# Patient Record
Sex: Female | Born: 1945 | Race: Black or African American | Hispanic: No | Marital: Married | Smoking: Never smoker
Health system: Southern US, Community
[De-identification: ages and names within clinical notes are randomized; demographics above are authoritative.]

## PROBLEM LIST (undated history)

## (undated) DIAGNOSIS — I10 Essential (primary) hypertension: Secondary | ICD-10-CM

## (undated) DIAGNOSIS — I2510 Atherosclerotic heart disease of native coronary artery without angina pectoris: Secondary | ICD-10-CM

## (undated) DIAGNOSIS — E1165 Type 2 diabetes mellitus with hyperglycemia: Secondary | ICD-10-CM

## (undated) DIAGNOSIS — R55 Syncope and collapse: Secondary | ICD-10-CM

## (undated) DIAGNOSIS — I779 Disorder of arteries and arterioles, unspecified: Secondary | ICD-10-CM

## (undated) DIAGNOSIS — L03119 Cellulitis of unspecified part of limb: Secondary | ICD-10-CM

## (undated) DIAGNOSIS — I639 Cerebral infarction, unspecified: Secondary | ICD-10-CM

## (undated) DIAGNOSIS — I350 Nonrheumatic aortic (valve) stenosis: Secondary | ICD-10-CM

## (undated) DIAGNOSIS — I272 Pulmonary hypertension, unspecified: Secondary | ICD-10-CM

## (undated) DIAGNOSIS — K922 Gastrointestinal hemorrhage, unspecified: Secondary | ICD-10-CM

## (undated) DIAGNOSIS — E119 Type 2 diabetes mellitus without complications: Secondary | ICD-10-CM

## (undated) DIAGNOSIS — I679 Cerebrovascular disease, unspecified: Secondary | ICD-10-CM

## (undated) DIAGNOSIS — I469 Cardiac arrest, cause unspecified: Secondary | ICD-10-CM

## (undated) DIAGNOSIS — D649 Anemia, unspecified: Secondary | ICD-10-CM

## (undated) DIAGNOSIS — Z9289 Personal history of other medical treatment: Secondary | ICD-10-CM

## (undated) DIAGNOSIS — I739 Peripheral vascular disease, unspecified: Secondary | ICD-10-CM

## (undated) DIAGNOSIS — IMO0002 Reserved for concepts with insufficient information to code with codable children: Secondary | ICD-10-CM

## (undated) DIAGNOSIS — Z992 Dependence on renal dialysis: Secondary | ICD-10-CM

## (undated) DIAGNOSIS — D696 Thrombocytopenia, unspecified: Secondary | ICD-10-CM

## (undated) DIAGNOSIS — I82409 Acute embolism and thrombosis of unspecified deep veins of unspecified lower extremity: Secondary | ICD-10-CM

## (undated) DIAGNOSIS — L02419 Cutaneous abscess of limb, unspecified: Secondary | ICD-10-CM

## (undated) DIAGNOSIS — I509 Heart failure, unspecified: Secondary | ICD-10-CM

## (undated) DIAGNOSIS — I219 Acute myocardial infarction, unspecified: Secondary | ICD-10-CM

## (undated) DIAGNOSIS — N186 End stage renal disease: Secondary | ICD-10-CM

## (undated) DIAGNOSIS — E118 Type 2 diabetes mellitus with unspecified complications: Secondary | ICD-10-CM

## (undated) HISTORY — PX: AV FISTULA PLACEMENT: SHX1204

## (undated) HISTORY — PX: BELOW KNEE LEG AMPUTATION: SUR23

## (undated) HISTORY — PX: TONSILLECTOMY: SUR1361

## (undated) HISTORY — PX: CATARACT EXTRACTION W/ INTRAOCULAR LENS  IMPLANT, BILATERAL: SHX1307

---

## 1997-12-01 ENCOUNTER — Ambulatory Visit (HOSPITAL_COMMUNITY): Admission: RE | Admit: 1997-12-01 | Discharge: 1997-12-01 | Payer: Self-pay | Admitting: Internal Medicine

## 1998-09-15 ENCOUNTER — Other Ambulatory Visit: Admission: RE | Admit: 1998-09-15 | Discharge: 1998-09-15 | Payer: Self-pay | Admitting: *Deleted

## 1999-04-10 ENCOUNTER — Ambulatory Visit (HOSPITAL_COMMUNITY): Admission: RE | Admit: 1999-04-10 | Discharge: 1999-04-10 | Payer: Self-pay | Admitting: *Deleted

## 1999-04-10 ENCOUNTER — Encounter: Payer: Self-pay | Admitting: *Deleted

## 2000-08-27 ENCOUNTER — Ambulatory Visit (HOSPITAL_COMMUNITY): Admission: RE | Admit: 2000-08-27 | Discharge: 2000-08-27 | Payer: Self-pay | Admitting: Internal Medicine

## 2000-08-27 ENCOUNTER — Encounter: Payer: Self-pay | Admitting: Internal Medicine

## 2000-08-31 ENCOUNTER — Inpatient Hospital Stay (HOSPITAL_COMMUNITY): Admission: EM | Admit: 2000-08-31 | Discharge: 2000-09-05 | Payer: Self-pay

## 2000-09-04 ENCOUNTER — Encounter: Payer: Self-pay | Admitting: Orthopedic Surgery

## 2000-09-09 DIAGNOSIS — Z9289 Personal history of other medical treatment: Secondary | ICD-10-CM

## 2000-09-09 HISTORY — DX: Personal history of other medical treatment: Z92.89

## 2000-09-09 HISTORY — PX: BELOW KNEE LEG AMPUTATION: SUR23

## 2000-12-15 ENCOUNTER — Encounter: Payer: Self-pay | Admitting: Internal Medicine

## 2000-12-15 ENCOUNTER — Ambulatory Visit (HOSPITAL_COMMUNITY): Admission: RE | Admit: 2000-12-15 | Discharge: 2000-12-15 | Payer: Self-pay | Admitting: Internal Medicine

## 2001-01-12 ENCOUNTER — Ambulatory Visit (HOSPITAL_COMMUNITY): Admission: RE | Admit: 2001-01-12 | Discharge: 2001-01-12 | Payer: Self-pay | Admitting: Surgery

## 2001-01-12 ENCOUNTER — Encounter: Payer: Self-pay | Admitting: Surgery

## 2001-01-30 ENCOUNTER — Encounter: Payer: Self-pay | Admitting: Internal Medicine

## 2001-01-30 ENCOUNTER — Ambulatory Visit (HOSPITAL_COMMUNITY): Admission: RE | Admit: 2001-01-30 | Discharge: 2001-01-30 | Payer: Self-pay | Admitting: Internal Medicine

## 2001-02-06 ENCOUNTER — Ambulatory Visit (HOSPITAL_COMMUNITY): Admission: RE | Admit: 2001-02-06 | Discharge: 2001-02-07 | Payer: Self-pay | Admitting: Cardiovascular Disease

## 2001-02-06 ENCOUNTER — Encounter: Payer: Self-pay | Admitting: Cardiovascular Disease

## 2001-02-16 ENCOUNTER — Inpatient Hospital Stay (HOSPITAL_COMMUNITY): Admission: RE | Admit: 2001-02-16 | Discharge: 2001-02-23 | Payer: Self-pay

## 2001-03-09 ENCOUNTER — Encounter (INDEPENDENT_AMBULATORY_CARE_PROVIDER_SITE_OTHER): Payer: Self-pay | Admitting: *Deleted

## 2001-03-09 ENCOUNTER — Inpatient Hospital Stay (HOSPITAL_COMMUNITY): Admission: EM | Admit: 2001-03-09 | Discharge: 2001-03-27 | Payer: Self-pay | Admitting: Emergency Medicine

## 2001-03-27 ENCOUNTER — Inpatient Hospital Stay (HOSPITAL_COMMUNITY)
Admission: RE | Admit: 2001-03-27 | Discharge: 2001-04-24 | Payer: Self-pay | Admitting: Physical Medicine & Rehabilitation

## 2001-03-28 ENCOUNTER — Encounter: Payer: Self-pay | Admitting: Physical Medicine & Rehabilitation

## 2001-03-30 ENCOUNTER — Encounter: Payer: Self-pay | Admitting: Physical Medicine & Rehabilitation

## 2001-04-03 ENCOUNTER — Encounter: Payer: Self-pay | Admitting: Physical Medicine & Rehabilitation

## 2001-04-05 ENCOUNTER — Encounter: Payer: Self-pay | Admitting: Physical Medicine & Rehabilitation

## 2001-04-06 ENCOUNTER — Encounter: Payer: Self-pay | Admitting: Physical Medicine & Rehabilitation

## 2001-04-07 ENCOUNTER — Encounter: Payer: Self-pay | Admitting: Physical Medicine & Rehabilitation

## 2001-05-18 ENCOUNTER — Encounter: Payer: Self-pay | Admitting: Internal Medicine

## 2001-05-18 ENCOUNTER — Inpatient Hospital Stay (HOSPITAL_COMMUNITY): Admission: EM | Admit: 2001-05-18 | Discharge: 2001-05-20 | Payer: Self-pay | Admitting: Emergency Medicine

## 2001-05-18 ENCOUNTER — Encounter: Payer: Self-pay | Admitting: Emergency Medicine

## 2001-05-21 ENCOUNTER — Encounter (HOSPITAL_COMMUNITY): Admission: RE | Admit: 2001-05-21 | Discharge: 2001-08-19 | Payer: Self-pay

## 2001-07-21 ENCOUNTER — Encounter
Admission: RE | Admit: 2001-07-21 | Discharge: 2001-10-19 | Payer: Self-pay | Admitting: Physical Medicine & Rehabilitation

## 2001-08-20 ENCOUNTER — Encounter (HOSPITAL_COMMUNITY): Admission: RE | Admit: 2001-08-20 | Discharge: 2001-09-08 | Payer: Self-pay

## 2001-09-10 ENCOUNTER — Encounter: Admission: RE | Admit: 2001-09-10 | Discharge: 2001-12-09 | Payer: Self-pay

## 2001-11-24 ENCOUNTER — Encounter
Admission: RE | Admit: 2001-11-24 | Discharge: 2002-02-22 | Payer: Self-pay | Admitting: Physical Medicine & Rehabilitation

## 2001-12-17 ENCOUNTER — Ambulatory Visit (HOSPITAL_COMMUNITY): Admission: RE | Admit: 2001-12-17 | Discharge: 2001-12-17 | Payer: Self-pay | Admitting: Internal Medicine

## 2001-12-30 ENCOUNTER — Encounter
Admission: RE | Admit: 2001-12-30 | Discharge: 2002-03-30 | Payer: Self-pay | Admitting: Physical Medicine & Rehabilitation

## 2002-04-01 ENCOUNTER — Encounter
Admission: RE | Admit: 2002-04-01 | Discharge: 2002-06-30 | Payer: Self-pay | Admitting: Physical Medicine & Rehabilitation

## 2002-07-01 ENCOUNTER — Encounter
Admission: RE | Admit: 2002-07-01 | Discharge: 2002-08-30 | Payer: Self-pay | Admitting: Physical Medicine & Rehabilitation

## 2002-12-23 ENCOUNTER — Ambulatory Visit (HOSPITAL_COMMUNITY): Admission: RE | Admit: 2002-12-23 | Discharge: 2002-12-23 | Payer: Self-pay | Admitting: Orthopedic Surgery

## 2003-12-28 ENCOUNTER — Encounter: Admission: RE | Admit: 2003-12-28 | Discharge: 2003-12-28 | Payer: Self-pay | Admitting: Nephrology

## 2005-05-27 ENCOUNTER — Ambulatory Visit (HOSPITAL_COMMUNITY): Admission: RE | Admit: 2005-05-27 | Discharge: 2005-05-27 | Payer: Self-pay | Admitting: Internal Medicine

## 2005-10-11 ENCOUNTER — Encounter: Admission: RE | Admit: 2005-10-11 | Discharge: 2005-10-11 | Payer: Self-pay | Admitting: Orthopedic Surgery

## 2007-01-11 ENCOUNTER — Inpatient Hospital Stay (HOSPITAL_COMMUNITY): Admission: EM | Admit: 2007-01-11 | Discharge: 2007-01-13 | Payer: Self-pay | Admitting: Emergency Medicine

## 2007-04-17 ENCOUNTER — Ambulatory Visit (HOSPITAL_COMMUNITY): Admission: RE | Admit: 2007-04-17 | Discharge: 2007-04-17 | Payer: Self-pay | Admitting: Internal Medicine

## 2007-04-17 ENCOUNTER — Encounter: Payer: Self-pay | Admitting: Internal Medicine

## 2007-04-17 ENCOUNTER — Ambulatory Visit: Payer: Self-pay | Admitting: Vascular Surgery

## 2007-05-29 ENCOUNTER — Ambulatory Visit: Payer: Self-pay | Admitting: Vascular Surgery

## 2008-08-01 ENCOUNTER — Ambulatory Visit: Payer: Self-pay | Admitting: Surgery

## 2008-08-01 ENCOUNTER — Encounter (HOSPITAL_BASED_OUTPATIENT_CLINIC_OR_DEPARTMENT_OTHER): Admission: RE | Admit: 2008-08-01 | Discharge: 2008-09-05 | Payer: Self-pay | Admitting: Internal Medicine

## 2008-09-06 ENCOUNTER — Encounter (HOSPITAL_BASED_OUTPATIENT_CLINIC_OR_DEPARTMENT_OTHER): Admission: RE | Admit: 2008-09-06 | Discharge: 2008-12-05 | Payer: Self-pay | Admitting: Internal Medicine

## 2008-11-07 ENCOUNTER — Ambulatory Visit: Payer: Self-pay | Admitting: Surgery

## 2008-12-01 ENCOUNTER — Encounter (HOSPITAL_BASED_OUTPATIENT_CLINIC_OR_DEPARTMENT_OTHER): Admission: RE | Admit: 2008-12-01 | Discharge: 2009-01-10 | Payer: Self-pay | Admitting: General Surgery

## 2009-04-03 ENCOUNTER — Ambulatory Visit: Payer: Self-pay | Admitting: Surgery

## 2009-09-09 DIAGNOSIS — I2510 Atherosclerotic heart disease of native coronary artery without angina pectoris: Secondary | ICD-10-CM

## 2009-09-09 HISTORY — PX: AV FISTULA PLACEMENT: SHX1204

## 2009-09-09 HISTORY — DX: Atherosclerotic heart disease of native coronary artery without angina pectoris: I25.10

## 2009-11-09 ENCOUNTER — Ambulatory Visit: Payer: Self-pay | Admitting: Vascular Surgery

## 2009-11-24 ENCOUNTER — Encounter: Admission: RE | Admit: 2009-11-24 | Discharge: 2009-12-18 | Payer: Self-pay | Admitting: Orthopedic Surgery

## 2010-04-04 ENCOUNTER — Encounter (HOSPITAL_COMMUNITY): Admission: RE | Admit: 2010-04-04 | Discharge: 2010-05-31 | Payer: Self-pay | Admitting: Nephrology

## 2010-04-16 ENCOUNTER — Ambulatory Visit: Payer: Self-pay | Admitting: Vascular Surgery

## 2010-04-25 ENCOUNTER — Ambulatory Visit: Payer: Self-pay | Admitting: Vascular Surgery

## 2010-06-25 ENCOUNTER — Ambulatory Visit: Payer: Self-pay | Admitting: Vascular Surgery

## 2010-07-06 ENCOUNTER — Ambulatory Visit (HOSPITAL_COMMUNITY)
Admission: RE | Admit: 2010-07-06 | Discharge: 2010-07-06 | Payer: Self-pay | Source: Home / Self Care | Admitting: Nephrology

## 2010-07-23 ENCOUNTER — Ambulatory Visit: Payer: Self-pay | Admitting: Vascular Surgery

## 2010-07-25 ENCOUNTER — Ambulatory Visit: Payer: Self-pay | Admitting: Vascular Surgery

## 2010-08-04 ENCOUNTER — Emergency Department (HOSPITAL_COMMUNITY)
Admission: EM | Admit: 2010-08-04 | Discharge: 2010-08-05 | Payer: Self-pay | Source: Home / Self Care | Admitting: Emergency Medicine

## 2010-08-07 ENCOUNTER — Ambulatory Visit: Payer: Self-pay | Admitting: Cardiovascular Disease

## 2010-08-15 ENCOUNTER — Ambulatory Visit (HOSPITAL_COMMUNITY)
Admission: RE | Admit: 2010-08-15 | Discharge: 2010-08-15 | Payer: Self-pay | Source: Home / Self Care | Attending: Nephrology | Admitting: Nephrology

## 2010-08-29 ENCOUNTER — Emergency Department (HOSPITAL_COMMUNITY)
Admission: EM | Admit: 2010-08-29 | Discharge: 2010-08-29 | Payer: Self-pay | Source: Home / Self Care | Admitting: Emergency Medicine

## 2010-08-29 ENCOUNTER — Inpatient Hospital Stay (HOSPITAL_COMMUNITY)
Admission: EM | Admit: 2010-08-29 | Discharge: 2010-09-02 | Payer: Self-pay | Source: Home / Self Care | Attending: Internal Medicine | Admitting: Internal Medicine

## 2010-08-30 ENCOUNTER — Encounter (INDEPENDENT_AMBULATORY_CARE_PROVIDER_SITE_OTHER): Payer: Self-pay | Admitting: Internal Medicine

## 2010-08-31 ENCOUNTER — Telehealth: Payer: Self-pay | Admitting: Cardiology

## 2010-09-13 ENCOUNTER — Encounter: Payer: Self-pay | Admitting: Physician Assistant

## 2010-09-13 ENCOUNTER — Ambulatory Visit
Admission: RE | Admit: 2010-09-13 | Discharge: 2010-09-13 | Payer: Self-pay | Source: Home / Self Care | Attending: Physician Assistant | Admitting: Physician Assistant

## 2010-09-13 DIAGNOSIS — E669 Obesity, unspecified: Secondary | ICD-10-CM | POA: Insufficient documentation

## 2010-09-13 DIAGNOSIS — I10 Essential (primary) hypertension: Secondary | ICD-10-CM | POA: Insufficient documentation

## 2010-09-13 DIAGNOSIS — N186 End stage renal disease: Secondary | ICD-10-CM

## 2010-09-13 DIAGNOSIS — E1122 Type 2 diabetes mellitus with diabetic chronic kidney disease: Secondary | ICD-10-CM | POA: Insufficient documentation

## 2010-09-13 DIAGNOSIS — I2510 Atherosclerotic heart disease of native coronary artery without angina pectoris: Secondary | ICD-10-CM | POA: Insufficient documentation

## 2010-09-13 DIAGNOSIS — E785 Hyperlipidemia, unspecified: Secondary | ICD-10-CM | POA: Insufficient documentation

## 2010-09-13 DIAGNOSIS — I2789 Other specified pulmonary heart diseases: Secondary | ICD-10-CM | POA: Insufficient documentation

## 2010-09-13 DIAGNOSIS — N289 Disorder of kidney and ureter, unspecified: Secondary | ICD-10-CM | POA: Insufficient documentation

## 2010-09-13 DIAGNOSIS — Z89519 Acquired absence of unspecified leg below knee: Secondary | ICD-10-CM | POA: Insufficient documentation

## 2010-09-13 DIAGNOSIS — R55 Syncope and collapse: Secondary | ICD-10-CM | POA: Insufficient documentation

## 2010-10-04 ENCOUNTER — Ambulatory Visit
Admission: RE | Admit: 2010-10-04 | Discharge: 2010-10-04 | Payer: Self-pay | Source: Home / Self Care | Attending: Cardiology | Admitting: Cardiology

## 2010-10-04 DIAGNOSIS — R0989 Other specified symptoms and signs involving the circulatory and respiratory systems: Secondary | ICD-10-CM | POA: Insufficient documentation

## 2010-10-04 DIAGNOSIS — I080 Rheumatic disorders of both mitral and aortic valves: Secondary | ICD-10-CM | POA: Insufficient documentation

## 2010-10-04 DIAGNOSIS — I359 Nonrheumatic aortic valve disorder, unspecified: Secondary | ICD-10-CM | POA: Insufficient documentation

## 2010-10-09 ENCOUNTER — Ambulatory Visit
Admission: RE | Admit: 2010-10-09 | Discharge: 2010-10-09 | Payer: Self-pay | Source: Home / Self Care | Attending: Emergency Medicine | Admitting: Emergency Medicine

## 2010-10-11 ENCOUNTER — Ambulatory Visit: Payer: Medicare Other | Admitting: Vascular Surgery

## 2010-10-11 NOTE — Assessment & Plan Note (Signed)
Summary: eph   Visit Type:  Follow-up Primary Provider:  Dr Margaretmary Bayley  CC:  CAD and mitral regurgitation.  History of Present Illness: The patient is seen for followup of coronary artery disease.  She also has significant mitral regurgitation.  There is pulmonary hypertension.  She has significant mitral regurgitation.  She had an episode of syncope in December, 2011.  She was hospitalized with this with an extensive workup.  There was no clear-cut etiology found.  Catheterization was done.  She has significant disease but medical therapy is recommended.  We are following her coronary disease and other problems and she is stable.  She's not having any significant chest pain or shortness of breath.  Her dialysis is going well.  There's been no recurrence.  Current Medications (verified): 1)  Amlodipine Besylate 10 Mg Tabs (Amlodipine Besylate) .Marland Kitchen.. 1 Once Daily 2)  Glimepiride 4 Mg Tabs (Glimepiride) .Marland Kitchen.. 1 Once Daily 3)  Hydralazine Hcl 25 Mg Tabs (Hydralazine Hcl) .... Take One & A Half Tablets By Mouth Three Times A Day. 4)  Simvastatin 20 Mg Tabs (Simvastatin) .Marland Kitchen.. 1 Once Daily 5)  Nephro-Vite Rx 1 Mg Tabs (B Complex-C-Folic Acid) .Marland Kitchen.. 1 Once Daily 6)  Isosorbide Dinitrate 30 Mg Tabs (Isosorbide Dinitrate) .Marland Kitchen.. 1 Once Daily 7)  Doxazosin Mesylate 8 Mg Tabs (Doxazosin Mesylate) .Marland Kitchen.. 1 Once Daily 8)  Ecotrin 325 Mg Tbec (Aspirin) .Marland Kitchen.. 1 Once Daily 9)  Ondansetron 8 Mg Tbdp (Ondansetron) .... As Needed 10)  Labetalol Hcl 300 Mg Tabs (Labetalol Hcl) .Marland Kitchen.. 1 Two Times A Day 11)  Hydralazine Hcl 25 Mg Tabs (Hydralazine Hcl) .... Take One Tablet By Mouth Three Times A Day 12)  Bystolic 5 Mg Tabs (Nebivolol Hcl) .... 1/2 Tab Once Daily 13)  Acetaminophen 500 Mg  Caps (Acetaminophen) .... As Needed  Allergies (verified): No Known Drug Allergies  Past History:  Past Medical History: CAD   a.  3v CAD by cath 08/2010 . . . diffuse c/w diab. vasculopathy . . med Rx EF 55-60%   12//2011: R  Heart Cath 08/2010: c/w Mod to Severe Pulmo HTN and severe MR Mitral regurgitation   moderate by echo, severe by catheter... December, 2011 Aortic stenosis    mild by echo... December, 2011 Pulmonary Hypertension CKD    dialysis since November 2011.  Hypertension.  Left BKA.  Obesity.  Volume overload problems in the past.  Diabetes.  Anemia.Marland KitchenMarland KitchenCKD Syncope    08/2010...etiology unclear Carotid bruits  Review of Systems       Patient denies fever, chills, headache, sweats, rash, change in vision, change in hearing, chest pain, cough, nausea vomiting, urinary symptoms.  All of the systems are reviewed and are negative.  Vital Signs:  Patient profile:   65 year old female Height:      56 inches Weight:      203 pounds BMI:     45.68 Pulse rate:   80 / minute BP sitting:   144 / 66  (left arm) Cuff size:   regular  Vitals Entered By: Hardin Negus, RMA (October 04, 2010 12:00 PM)  Physical Exam  General:  the patient is stable. Head:  head is atraumatic. Eyes:  no xanthelasma. Neck:  no jugular venous distention.  Dialysis catheter in the right subclavian and into the neck.  There are bilateral carotid bruits. Chest Wall:  no chest wall tenderness. Lungs:  lungs are clear.  Respiratory effort is nonlabored. Heart:  cardiac exam reveals S1-S2 and a systolic  murmur. Abdomen:  abdomen is soft. Msk:  no musculoskeletal deformities.  She does walk with a cane. Extremities:  no peripheral edema. Skin:  no skin rashes. Psych:  patient is oriented to person time and place.  Affect is normal.   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  It is possible some of this is related to her mild aortic stenosis.  However they seem well.  I have reviewed the old hospital record and found the Doppler from 2008.  At that time she did not have significant carotid  disease.  She does need carotid Doppler followup at this time and this is being scheduled.  Problem # 2:  MITRAL REGURGITATION (ICD-396.3) The patient has mitral regurgitation.  It was felt to be moderate by echo and there was question of severe by catheter.  She has pulmonary hypertension.  I will await further information from the pulmonary group before any further evaluation.  Problem # 3:  SYNCOPE (ICD-780.2) The patient has not had any recurrent syncope since the episode that occurred after her dialysis.  Etiology remains unclear.  No further workup now.  Problem # 4:  HYPERLIPIDEMIA (ICD-272.4) Lipids are being treated.  No change in therapy.  Problem # 5:  PULMONARY HYPERTENSION (ICD-416.8) Patient has significant pulmonary hypertension.  She is scheduled to see Dr.Byrum today.  Problem # 6:  HYPERTENSION (ICD-401.9) The patient's blood pressure is under better control with recent changes in her medications.  No change in therapy.  Problem # 7:  RENAL DISEASE, CHRONIC (ICD-593.9) The patient is tolerating dialysis.  No change in therapy.  Problem # 8:  AORTIC STENOSIS (ICD-424.1) The patient has mild aortic stenosis by echo.  No further workup.  Problem # 9:  CORONARY ATHEROSCLEROSIS NATIVE CORONARY ARTERY (ICD-414.01) Patient has known coronary artery disease.  Catheterization was done in December, 2011.  She has diffuse disease compatible with diabetic vasculopathy.  There was no intervention.  Medical therapy is recommended.  EKG is done today and reviewed by me.  As compared with the tracing of August 31, 2010 she has an interventricular conduction delay.  There is normal sinus rhythm.  There is no significant change.  Followup in 3 months.  Other Orders: Carotid Duplex (Carotid Duplex)  Patient Instructions: 1)  Your physician recommends that you schedule a follow-up appointment in: 3 months 2)  Your physician recommends that you continue on your current medications as  directed. Please refer to the Current Medication list given to you today. 3)  Your physician has requested that you have a carotid duplex. This test is an ultrasound of the carotid arteries in your neck. It looks at blood flow through these arteries that supply the brain with blood. Allow one hour for this exam. There are no restrictions or special instructions.

## 2010-10-11 NOTE — Progress Notes (Signed)
Summary: question on procedure  Phone Note Call from Patient Call back at [phone]   Caller: Daughter/dana Reason for Call: Talk to Nurse Summary of Call: pt mother is on the hospital. pt daugther has question re procedure. Initial call taken by: Roe Coombs,  August 31, 2010 12:15 PM  Follow-up for Phone Call        08/01/10--1320pm--pt's daughter calling about c cath procedure being done today by dr Lottie Mussel with dr Graciela Husbands and sent him daughter's phone number, so he may answer her questions--nt Follow-up by: Ledon Snare, RN,  August 31, 2010 1:24 PM

## 2010-10-11 NOTE — Assessment & Plan Note (Signed)
Summary: eph   Visit Type:  PHOSP  CC:  NO CARDIAC COMPLAINTS.  History of Present Illness: Primary Cardiologist:  Dr. Zackery Barefoot  Briana Gould is a 65 yo female with ESRD on hemodialysis, PAD s/p failed revasc. surgery in the past with eventual left BKA, DM2 and HTN who was admitted to [facility] 12/21-25/2011 with syncope during dialysis.  TnI's were elevated suspicious for NSTEMI.  She underwent a cath 12/23 that demonstrated diffuse, heavily calcified 3v CAD c/w diabetic vasculopathy.  Echo demonstrated EF 55-60%, mod LVH, diast dysfxn, mild AS, mod MR and a PASP of 78.  Findings on right heart cath were c/w mod to severe pulmo. HTN and severe MR.  Medical therapy was recommended for her CAD as no targets were available for PCI or CABG.  Etiology of her syncope was not clear.  OP consultation with Dr. Delton Coombes is planned for her pulmonary HTN.  She returns for follow up.  She denies chest discomfort.  She denies shortness of breath.  She denies orthopnea or PND.  She has had all of her weight taken off at dialysis since discharge.  She is taking her medications.  She denies any further syncopal episodes.  She has an appointment in the system with Dr. Delton Coombes in the next couple of weeks.  However, she was unaware of this.  Current Medications (verified): 1)  Amlodipine Besylate 10 Mg Tabs (Amlodipine Besylate) .Marland Kitchen.. 1 Once Daily 2)  Glimepiride 4 Mg Tabs (Glimepiride) .Marland Kitchen.. 1 Once Daily 3)  Hydralazine Hcl 25 Mg Tabs (Hydralazine Hcl) .Marland Kitchen.. 1 Three Times A Day 4)  Simvastatin 20 Mg Tabs (Simvastatin) .Marland Kitchen.. 1 Once Daily 5)  Nephro-Vite Rx 1 Mg Tabs (B Complex-C-Folic Acid) .Marland Kitchen.. 1 Once Daily 6)  Isosorbide Dinitrate 30 Mg Tabs (Isosorbide Dinitrate) .Marland Kitchen.. 1 Once Daily 7)  Doxazosin Mesylate 8 Mg Tabs (Doxazosin Mesylate) .Marland Kitchen.. 1 Once Daily 8)  Ecotrin 325 Mg Tbec (Aspirin) .Marland Kitchen.. 1 Once Daily 9)  Ondansetron 8 Mg Tbdp (Ondansetron) .... As Needed 10)  Labetalol Hcl 300 Mg Tabs (Labetalol Hcl) .Marland Kitchen.. 1  Two Times A Day  Past History:  Past Medical History: CAD   a.  3v CAD by cath 08/2010 . . . diffuse c/w diab. vasculopathy . . med Rx Echo 08/30/2010: EF 55-60%; mod LVH; mild AS, mod MR R Heart Cath 08/2010: c/w Mod to Severe Pulmo HTN and severe MR Chronic kidney disease on dialysis since November 2011.  Hypertension.  Left BKA.  Obesity.  Volume overload problems in the past.  Diabetes.  Anemia  Past Surgical History: Left BKA  Review of Systems       As per  the HPI.  All other systems reviewed and negative.   Vital Signs:  Patient profile:   65 year old female Height:      56 inches Weight:      203.75 pounds BMI:     45.84 BP sitting:   156 / 70  (left arm) Cuff size:   large  Vitals Entered By: Marrion Coy, CNA (September 13, 2010 10:10 AM)  Physical Exam  General:  Well nourished, well developed, in no acute distress HEENT: normal Neck: no JVD Cardiac:  normal S1, S2; RRR; 2/6 systolic murmur Lungs:  decreased breath sounds at the bases with assoc crackles; no wheezing Abd: soft, nontender, no hepatomegaly Ext: tight 1-2+ edema on the right Vasc: RFA site without hematoma or bruit Skin: warm and dry Neuro:  CNs 2-12 intact, no focal  abnormalities noted    EKG  Procedure date:  09/13/2010  Findings:      normal sinus rhythm Heart rate 78 Left axis deviation Interventricular conduction delay No significant change compared to prior tracing dated 08/31/10  Impression & Recommendations:  Problem # 1:  CORONARY ATHEROSCLEROSIS NATIVE CORONARY ARTERY (ICD-414.01) Diffuse three-vessel CAD consistent with diabetic vasculopathy.  She is being treated medically.  She denies any anginal symptoms at this time.  Problem # 2:  PULMONARY HYPERTENSION (ICD-416.8) She will see Dr. Delton Coombes later this month for further evaluation.  Problem # 3:  MITRAL REGURGITATION, MODERATE (ICD-396.3) As above, she will see Dr. Delton Coombes later this month.  She also has followup  with Dr. Myrtis Ser.  She should keep this appointment.  Problem # 4:  RENAL DISEASE, CHRONIC (ICD-593.9) She continues on hemodialysis per nephrology.  Problem # 5:  HYPERTENSION (ICD-401.9) This remains elevated.  However, it sounds as though her blood pressures look much better than they have in the past.  She would certainly benefit from further afterload reduction.  Therefore, I have increased her hydralazine to 37.5 mg 3 times a day.  Problem # 6:  HYPERLIPIDEMIA (ICD-272.4) She was recently placed on simvastatin.  Followup fasting lipids and LFTs will be obtained in 8 weeks.  She is unable to increase her dose of simvastatin due to concomitant use of amlodipine.  Her updated medication list for this problem includes:    Simvastatin 20 Mg Tabs (Simvastatin) .Marland Kitchen... 1 once daily  Problem # 7:  SYNCOPE (ICD-780.2) Etiology unclear. LVF normal.  Patient Instructions: 1)  Increase hydralazine to 25mg  one & a half tablets by mouth three times a day. 2)  You will need to come for FASTING labwork in 8 weeks: lipid/liver (414.01)- this will be around the week of 11/05/10.  3)  Followup with Dr. Myrtis Ser as scheduled on : Tues 10/02/10 @ 9:00am. 4)  Followup with Dr. Delton Coombes as scheduled on: Tues 10/02/10 @ 3:30pm. His office is located at [address]. His office number is [phone]. Prescriptions: HYDRALAZINE HCL 25 MG TABS (HYDRALAZINE HCL) take one & a half tablets by mouth three times a day.  #135 x 6   Entered by:   Sherri Rad, RN, BSN   Authorized by:   Tereso Newcomer PA-C   Signed by:   Sherri Rad, RN, BSN on 09/13/2010   Method used:   Print then Give to Patient   RxID:   [id number]

## 2010-10-15 ENCOUNTER — Telehealth (INDEPENDENT_AMBULATORY_CARE_PROVIDER_SITE_OTHER): Payer: Self-pay | Admitting: *Deleted

## 2010-10-17 NOTE — Assessment & Plan Note (Signed)
Summary: PAH   Visit Type:  Initial Consult Copy to:  Dr. Casimiro Needle Primary Provider/Referring Provider:  Dr Margaretmary Bayley  CC:  Pulmonary consult.Marland Kitchen  History of Present Illness: 65 yo never smoker with HTN, diffuse CAD, obesity, decreased LV fxn. Also w progressive renal failure, started HD in 11/11. C/b syncope while on HD. Was hosp 12/11 for the syncope, eval led to a cardiac workup. R heart cath 12/11 showed PAH with mean PAP 50, PAOP 30. V/Q scan was low prob for PE. Still having trouble with high BPs after discharge. No further syncope.     Preventive Screening-Counseling & Management  Alcohol-Tobacco     Alcohol drinks/day: 0     Smoking Status: never  Allergies: No Known Drug Allergies  Past History:  Past Medical History: Last updated: 10/04/2010 CAD   a.  3v CAD by cath 08/2010 . . . diffuse c/w diab. vasculopathy . . med Rx EF 55-60%   12//2011: R Heart Cath 08/2010: c/w Mod to Severe Pulmo HTN and severe MR Mitral regurgitation   moderate by echo, severe by catheter... December, 2011 Aortic stenosis    mild by echo... December, 2011 Pulmonary Hypertension CKD    dialysis since November 2011.  Hypertension.  Left BKA.  Obesity.  Volume overload problems in the past.  Diabetes.  Anemia.Marland KitchenMarland KitchenCKD Syncope    08/2010...etiology unclear Carotid bruits  Family History: Last updated: September 25, 2010   Mother died at 54 year old of complication of stroke,   had a prior history of heart failure.  Father died of a PE after surgery   at 16 year old.   Past Surgical History: Left BKA in 2002  Social History: She is a Teacher, early years/pre.  Right   now she is off of work.  She is on a dialysis.  She denies smoking,   recreational drugs, or alcohol.  She is married.  She lives at home with   husband and grandchildren.  She has two children and 6 grandchildren.     Patient never smoked.  Alcohol drinks/day:  0 Smoking Status:  never  Review of  Systems       The patient complains of weight change.  The patient denies shortness of breath with activity, shortness of breath at rest, productive cough, non-productive cough, coughing up blood, chest pain, irregular heartbeats, acid heartburn, indigestion, loss of appetite, abdominal pain, difficulty swallowing, sore throat, tooth/dental problems, headaches, nasal congestion/difficulty breathing through nose, sneezing, itching, ear ache, anxiety, depression, hand/feet swelling, joint stiffness or pain, rash, change in color of mucus, and fever.    Vital Signs:  Patient profile:   65 year old female Height:      56 inches (142.24 cm) Weight:      204.56 pounds (92.98 kg) BMI:     46.03 O2 Sat:      100 % on Room air Temp:     98.4 degrees F (36.89 degrees C) oral Pulse rate:   69 / minute BP sitting:   130 / 80  (right arm) Cuff size:   large  Vitals Entered By: Michel Bickers CMA (October 09, 2010 10:53 AM)  O2 Sat at Rest %:  100 O2 Flow:  Room air  Serial Vital Signs/Assessments:  Comments: 12:05 PM Ambulatory Pulse Oximetry  Resting; HR_72____    02 Sat__96% on room air___  Lap1 (185 feet)   HR__78___   02 Sat__92% on room air___ Lap2 (185 feet)   HR__85___   02 Sat__93% on room  air___    Lap3 (185 feet)   HR_____   02 Sat_____  ___Test Completed without Difficulty _x__Test Stopped due to: left leg discomfort with walking; breathing is doing well  By: Michel Bickers CMA   CC: Pulmonary consult. Is Patient Diabetic? Yes Comments Medications reviewed with patient Michel Bickers CMA  October 09, 2010 11:15 AM   Physical Exam  General:  normal appearance, healthy appearing, and obese.   Head:  normocephalic and atraumatic Eyes:  conjunctiva and sclera clear Nose:  no deformity, discharge, inflammation, or lesions Mouth:  no deformity or lesions Neck:  no masses, thyromegaly, or abnormal cervical nodes Lungs:  clear B, no crackles or wheezes Heart:  regular rate and  rhythm, S1, S2 without murmurs, rubs, gallops, or clicks Extremities:  no edema, s/p BKA Psych:  alert and cooperative; normal mood and affect; normal attention span and concentration   Cardiac Cath  Procedure date:  08/31/2010  Findings:      HEMODYNAMIC DATA:  Central aortic pressure 180/88 with a mean of 105. The LV pressure of 183/15 with an EDP of 27.  Right atrial pressure with a mean of 13.  RV pressure 74/11 with an EDP of 15.  PA pressure 79/27 with a mean of 50.  Pulmonary capillary wedge pressure with a mean of 30 with V-waves to 45.  Fick cardiac output 4.9.  Fick cardiac index 3.3. Pulmonary vascular resistance was 4.0 woods units.   Left main was normal.   LAD is a long vessel coursing to the apex gave off two diagonals.  The entire vessel had diffuse diabetic vasculopathy with approximately 40- 50% stenosis throughout.  In the midportion of the LAD, there was a focal 60% lesion in the distal apical LAD.  There was a 60-70% focal lesion.  Impression & Recommendations:  Problem # 1:  PULMONARY HYPERTENSION (ICD-416.8) Suspect multifactorial, contributions from her L sided dysfxn, possible OSA (need to screen for this). Consider also auto-immune disease although no symptoms to support this.  - auto-immune labs - recommended PSG, she wants to think about it - tight BP control - HD per Dr Lowell Guitar.   Other Orders: Consultation Level IV (81191)  Patient Instructions: 1)  We will check bloodwork 2)  You will need tight BP control with Drs Lowell Guitar and Myrtis Ser.  3)  We discussed possible sleep apnea today. We should revisit the issue and a possible sleep study at your next visit. 4)  Follow up with Dr Delton Coombes in 2 months or as needed

## 2010-10-25 NOTE — Progress Notes (Signed)
  Phone Note Other Incoming   Request: Send information Summary of Call: Request for records received from [facility]. Request forwarded to [facility].      Appended Document:  [facility] Request received sent to [facility]

## 2010-10-30 ENCOUNTER — Encounter: Payer: Self-pay | Admitting: Cardiology

## 2010-10-30 ENCOUNTER — Encounter (INDEPENDENT_AMBULATORY_CARE_PROVIDER_SITE_OTHER): Payer: Medicare Other

## 2010-10-30 DIAGNOSIS — R0989 Other specified symptoms and signs involving the circulatory and respiratory systems: Secondary | ICD-10-CM

## 2010-10-31 ENCOUNTER — Encounter: Payer: Self-pay | Admitting: Cardiology

## 2010-11-07 ENCOUNTER — Encounter: Payer: Self-pay | Admitting: Cardiology

## 2010-11-19 LAB — HEPATIC FUNCTION PANEL
ALT: 16 U/L (ref 0–35)
AST: 28 U/L (ref 0–37)
Albumin: 2.5 g/dL — ABNORMAL LOW (ref 3.5–5.2)
Bilirubin, Direct: <0.1 mg/dL (ref 0.0–0.3)
Total Bilirubin: 0.5 mg/dL (ref 0.3–1.2)
Total Protein: 5.8 g/dL — ABNORMAL LOW (ref 6.0–8.3)

## 2010-11-19 LAB — GLUCOSE, CAPILLARY
Glucose-Capillary: 118 mg/dL — ABNORMAL HIGH (ref 70–99)
Glucose-Capillary: 125 mg/dL — ABNORMAL HIGH (ref 70–99)
Glucose-Capillary: 128 mg/dL — ABNORMAL HIGH (ref 70–99)
Glucose-Capillary: 138 mg/dL — ABNORMAL HIGH (ref 70–99)
Glucose-Capillary: 139 mg/dL — ABNORMAL HIGH (ref 70–99)
Glucose-Capillary: 148 mg/dL — ABNORMAL HIGH (ref 70–99)
Glucose-Capillary: 178 mg/dL — ABNORMAL HIGH (ref 70–99)

## 2010-11-19 LAB — POCT I-STAT 3, VENOUS BLOOD GAS (G3P V)
Acid-Base Excess: 4 mmol/L — ABNORMAL HIGH (ref 0.0–2.0)
Bicarbonate: 26.8 mEq/L — ABNORMAL HIGH (ref 20.0–24.0)
Bicarbonate: 28.7 mEq/L — ABNORMAL HIGH (ref 20.0–24.0)
TCO2: 28 mmol/L (ref 0–100)
TCO2: 30 mmol/L (ref 0–100)
pCO2, Ven: 41.3 mmHg — ABNORMAL LOW (ref 45.0–50.0)
pH, Ven: 7.42 — ABNORMAL HIGH (ref 7.250–7.300)

## 2010-11-19 LAB — COMPREHENSIVE METABOLIC PANEL
ALT: 23 U/L (ref 0–35)
AST: 23 U/L (ref 0–37)
AST: 72 U/L — ABNORMAL HIGH (ref 0–37)
Albumin: 2.7 g/dL — ABNORMAL LOW (ref 3.5–5.2)
Albumin: 2.7 g/dL — ABNORMAL LOW (ref 3.5–5.2)
Alkaline Phosphatase: 97 U/L (ref 39–117)
Alkaline Phosphatase: 95 U/L (ref 39–117)
BUN: 10 mg/dL (ref 6–23)
CO2: 29 mEq/L (ref 19–32)
Calcium: 8.9 mg/dL (ref 8.4–10.5)
Chloride: 102 mEq/L (ref 96–112)
Chloride: 100 mEq/L (ref 96–112)
Creatinine, Ser: 3.8 mg/dL — ABNORMAL HIGH (ref 0.4–1.2)
Creatinine, Ser: 5.38 mg/dL — ABNORMAL HIGH (ref 0.4–1.2)
GFR calc Af Amer: 14 mL/min — ABNORMAL LOW (ref >60)
GFR calc Af Amer: 10 mL/min — ABNORMAL LOW (ref >60)
GFR calc non Af Amer: 12 mL/min — ABNORMAL LOW (ref >60)
Glucose, Bld: 183 mg/dL — ABNORMAL HIGH (ref 70–99)
Potassium: 4 mEq/L (ref 3.5–5.1)
Potassium: 3.5 mEq/L (ref 3.5–5.1)
Sodium: 139 mEq/L (ref 135–145)
Total Bilirubin: 0.4 mg/dL (ref 0.3–1.2)
Total Bilirubin: 0.5 mg/dL (ref 0.3–1.2)
Total Protein: 6.5 g/dL (ref 6.0–8.3)
Total Protein: 6.6 g/dL (ref 6.0–8.3)

## 2010-11-19 LAB — BASIC METABOLIC PANEL
BUN: 21 mg/dL (ref 6–23)
BUN: 9 mg/dL (ref 6–23)
CO2: 28 mEq/L (ref 19–32)
CO2: 29 mEq/L (ref 19–32)
Calcium: 9.3 mg/dL (ref 8.4–10.5)
Chloride: 101 mEq/L (ref 96–112)
Chloride: 101 mEq/L (ref 96–112)
Creatinine, Ser: 3.52 mg/dL — ABNORMAL HIGH (ref 0.4–1.2)
Creatinine, Ser: 6.39 mg/dL — ABNORMAL HIGH (ref 0.4–1.2)
GFR calc Af Amer: 8 mL/min — ABNORMAL LOW (ref >60)
GFR calc non Af Amer: 7 mL/min — ABNORMAL LOW (ref >60)
Glucose, Bld: 103 mg/dL — ABNORMAL HIGH (ref 70–99)
Potassium: 3.7 mEq/L (ref 3.5–5.1)
Sodium: 140 mEq/L (ref 135–145)

## 2010-11-19 LAB — CBC
HCT: 24.6 % — ABNORMAL LOW (ref 36.0–46.0)
HCT: 30.5 % — ABNORMAL LOW (ref 36.0–46.0)
HCT: 32.1 % — ABNORMAL LOW (ref 36.0–46.0)
Hemoglobin: 10.3 g/dL — ABNORMAL LOW (ref 12.0–15.0)
Hemoglobin: 7.6 g/dL — ABNORMAL LOW (ref 12.0–15.0)
MCH: 29.1 pg (ref 26.0–34.0)
MCH: 29.3 pg (ref 26.0–34.0)
MCH: 29.4 pg (ref 26.0–34.0)
MCH: 29.7 pg (ref 26.0–34.0)
MCHC: 30.9 g/dL (ref 30.0–36.0)
MCHC: 32.1 g/dL (ref 30.0–36.0)
MCV: 91.3 fL (ref 78.0–100.0)
MCV: 91.5 fL (ref 78.0–100.0)
MCV: 92.4 fL (ref 78.0–100.0)
MCV: 94.3 fL (ref 78.0–100.0)
Platelets: 152 10*3/uL (ref 150–400)
Platelets: 172 10*3/uL (ref 150–400)
Platelets: 177 10*3/uL (ref 150–400)
Platelets: 223 10*3/uL (ref 150–400)
Platelets: 163 10*3/uL (ref 150–400)
RBC: 2.61 MIL/uL — ABNORMAL LOW (ref 3.87–5.11)
RBC: 3.1 MIL/uL — ABNORMAL LOW (ref 3.87–5.11)
RBC: 3.3 MIL/uL — ABNORMAL LOW (ref 3.87–5.11)
RBC: 3.51 MIL/uL — ABNORMAL LOW (ref 3.87–5.11)
RBC: 2.77 MIL/uL — ABNORMAL LOW (ref 3.87–5.11)
RDW: 16.2 % — ABNORMAL HIGH (ref 11.5–15.5)
RDW: 16.8 % — ABNORMAL HIGH (ref 11.5–15.5)
RDW: 17.3 % — ABNORMAL HIGH (ref 11.5–15.5)
RDW: 15.8 % — ABNORMAL HIGH (ref 11.5–15.5)
WBC: 5.5 10*3/uL (ref 4.0–10.5)
WBC: 6.6 10*3/uL (ref 4.0–10.5)
WBC: 8.8 10*3/uL (ref 4.0–10.5)

## 2010-11-19 LAB — TYPE AND SCREEN: Unit division: 0

## 2010-11-19 LAB — LIPID PANEL
Cholesterol: 170 mg/dL (ref 0–200)
HDL: 56 mg/dL (ref >39)
LDL Cholesterol: 100 mg/dL — ABNORMAL HIGH (ref 0–99)
Total CHOL/HDL Ratio: 3 RATIO
Triglycerides: 71 mg/dL (ref <150)
VLDL: 14 mg/dL (ref 0–40)

## 2010-11-19 LAB — DIFFERENTIAL
Basophils Absolute: 0 10*3/uL (ref 0.0–0.1)
Eosinophils Absolute: 0.2 10*3/uL (ref 0.0–0.7)
Eosinophils Relative: 3 % (ref 0–5)
Eosinophils Relative: 2 % (ref 0–5)
Lymphocytes Relative: 13 % (ref 12–46)
Lymphs Abs: 1.6 10*3/uL (ref 0.7–4.0)
Monocytes Absolute: 0.6 10*3/uL (ref 0.1–1.0)
Monocytes Absolute: 0.4 10*3/uL (ref 0.1–1.0)
Monocytes Relative: 7 % (ref 3–12)
Monocytes Relative: 5 % (ref 3–12)
Neutro Abs: 6.9 10*3/uL (ref 1.7–7.7)

## 2010-11-19 LAB — HEMOGLOBIN A1C
Hgb A1c MFr Bld: 6.8 % — ABNORMAL HIGH (ref <5.7)
Mean Plasma Glucose: 148 mg/dL — ABNORMAL HIGH (ref <117)

## 2010-11-19 LAB — POCT CARDIAC MARKERS
CKMB, poc: 1.4 ng/mL (ref 1.0–8.0)
CKMB, poc: 2.6 ng/mL (ref 1.0–8.0)
Myoglobin, poc: 341 ng/mL (ref 12–200)
Troponin i, poc: <0.05 ng/mL (ref 0.00–0.09)

## 2010-11-19 LAB — TSH: TSH: 0.438 u[IU]/mL (ref 0.350–4.500)

## 2010-11-19 LAB — CULTURE, BLOOD (ROUTINE X 2)
Culture  Setup Time: 201112212212
Culture  Setup Time: 201112212212
Culture: NO GROWTH
Culture: NO GROWTH

## 2010-11-19 LAB — CARDIAC PANEL(CRET KIN+CKTOT+MB+TROPI)
CK, MB: 2.9 ng/mL (ref 0.3–4.0)
CK, MB: 3 ng/mL (ref 0.3–4.0)
Relative Index: INVALID (ref 0.0–2.5)
Relative Index: INVALID (ref 0.0–2.5)
Total CK: 94 U/L (ref 7–177)
Total CK: 97 U/L (ref 7–177)
Troponin I: 0.52 ng/mL (ref 0.00–0.06)
Troponin I: 0.57 ng/mL (ref 0.00–0.06)

## 2010-11-19 LAB — POCT I-STAT 3, ART BLOOD GAS (G3+)
Acid-Base Excess: 5 mmol/L — ABNORMAL HIGH (ref 0.0–2.0)
Bicarbonate: 28.3 mEq/L — ABNORMAL HIGH (ref 20.0–24.0)
O2 Saturation: 93 %
TCO2: 29 mmol/L (ref 0–100)
pCO2 arterial: 37.7 mmHg (ref 35.0–45.0)
pH, Arterial: 7.484 — ABNORMAL HIGH (ref 7.350–7.400)
pO2, Arterial: 63 mmHg — ABNORMAL LOW (ref 80.0–100.0)

## 2010-11-19 LAB — RENAL FUNCTION PANEL
Albumin: 2.9 g/dL — ABNORMAL LOW (ref 3.5–5.2)
Chloride: 100 mEq/L (ref 96–112)
GFR calc Af Amer: 10 mL/min — ABNORMAL LOW (ref >60)
GFR calc non Af Amer: 8 mL/min — ABNORMAL LOW (ref >60)
Phosphorus: 3.7 mg/dL (ref 2.3–4.6)
Potassium: 3.9 mEq/L (ref 3.5–5.1)
Sodium: 137 mEq/L (ref 135–145)

## 2010-11-19 LAB — PROTIME-INR: INR: 1.03 (ref 0.00–1.49)

## 2010-11-19 LAB — OCCULT BLOOD, POC DEVICE: Fecal Occult Bld: NEGATIVE

## 2010-11-19 LAB — IRON AND TIBC
Saturation Ratios: 36 % (ref 20–55)
TIBC: 177 ug/dL — ABNORMAL LOW (ref 250–470)
UIBC: 110 ug/dL

## 2010-11-19 LAB — LIPASE, BLOOD: Lipase: 18 U/L (ref 11–59)

## 2010-11-19 LAB — MAGNESIUM: Magnesium: 2.1 mg/dL (ref 1.5–2.5)

## 2010-11-19 LAB — RETICULOCYTES: Retic Count, Absolute: 52.8 10*3/uL (ref 19.0–186.0)

## 2010-11-19 LAB — APTT: aPTT: 31 seconds (ref 24–37)

## 2010-11-19 LAB — HEPATITIS B DNA, ULTRAQUANTITATIVE, PCR

## 2010-11-19 LAB — ABO/RH: ABO/RH(D): O POS

## 2010-11-19 LAB — D-DIMER, QUANTITATIVE: D-Dimer, Quant: 1.62 ug/mL-FEU — ABNORMAL HIGH (ref 0.00–0.48)

## 2010-11-19 LAB — FERRITIN: Ferritin: 683 ng/mL — ABNORMAL HIGH (ref 10–291)

## 2010-11-19 LAB — CK TOTAL AND CKMB (NOT AT ARMC): Relative Index: INVALID (ref 0.0–2.5)

## 2010-11-19 LAB — VITAMIN B12: Vitamin B-12: 1829 pg/mL — ABNORMAL HIGH (ref 211–911)

## 2010-11-19 LAB — FOLATE: Folate: 9 ng/mL

## 2010-11-19 LAB — HEPATITIS B SURFACE ANTIGEN: Hepatitis B Surface Ag: NEGATIVE

## 2010-11-20 LAB — COMPREHENSIVE METABOLIC PANEL
AST: 23 U/L (ref 0–37)
BUN: 44 mg/dL — ABNORMAL HIGH (ref 6–23)
CO2: 23 mEq/L (ref 19–32)
Calcium: 9.4 mg/dL (ref 8.4–10.5)
Chloride: 98 mEq/L (ref 96–112)
Creatinine, Ser: 6.27 mg/dL — ABNORMAL HIGH (ref 0.4–1.2)
GFR calc Af Amer: 8 mL/min — ABNORMAL LOW (ref >60)
GFR calc non Af Amer: 7 mL/min — ABNORMAL LOW (ref >60)
Glucose, Bld: 262 mg/dL — ABNORMAL HIGH (ref 70–99)
Total Bilirubin: 0.7 mg/dL (ref 0.3–1.2)

## 2010-11-20 LAB — CBC
HCT: 36.4 % (ref 36.0–46.0)
Hemoglobin: 11.5 g/dL — ABNORMAL LOW (ref 12.0–15.0)
MCH: 28.5 pg (ref 26.0–34.0)
MCHC: 31.6 g/dL (ref 30.0–36.0)
MCV: 90.1 fL (ref 78.0–100.0)
RBC: 4.04 MIL/uL (ref 3.87–5.11)

## 2010-11-20 LAB — POCT I-STAT, CHEM 8
BUN: 53 mg/dL — ABNORMAL HIGH (ref 6–23)
Creatinine, Ser: 6.4 mg/dL — ABNORMAL HIGH (ref 0.4–1.2)
Potassium: 4.9 mEq/L (ref 3.5–5.1)
Sodium: 134 mEq/L — ABNORMAL LOW (ref 135–145)
TCO2: 24 mmol/L (ref 0–100)

## 2010-11-20 LAB — DIFFERENTIAL
Basophils Absolute: 0 10*3/uL (ref 0.0–0.1)
Eosinophils Relative: 0 % (ref 0–5)
Lymphocytes Relative: 8 % — ABNORMAL LOW (ref 12–46)
Lymphs Abs: 0.7 10*3/uL (ref 0.7–4.0)
Neutro Abs: 7.6 10*3/uL (ref 1.7–7.7)
Neutrophils Relative %: 87 % — ABNORMAL HIGH (ref 43–77)

## 2010-11-21 LAB — CBC
HCT: 34.6 % — ABNORMAL LOW (ref 36.0–46.0)
MCH: 29 pg (ref 26.0–34.0)
MCHC: 31.8 g/dL (ref 30.0–36.0)
MCV: 91.3 fL (ref 78.0–100.0)
RDW: 15.3 % (ref 11.5–15.5)

## 2010-11-21 LAB — BASIC METABOLIC PANEL
BUN: 61 mg/dL — ABNORMAL HIGH (ref 6–23)
Chloride: 110 mEq/L (ref 96–112)
GFR calc Af Amer: 6 mL/min — ABNORMAL LOW (ref >60)
GFR calc non Af Amer: 5 mL/min — ABNORMAL LOW (ref >60)
Glucose, Bld: 98 mg/dL (ref 70–99)
Sodium: 137 mEq/L (ref 135–145)

## 2010-11-21 LAB — GLUCOSE, CAPILLARY: Glucose-Capillary: 88 mg/dL (ref 70–99)

## 2010-11-21 LAB — PROTIME-INR: INR: 1.06 (ref 0.00–1.49)

## 2010-11-26 ENCOUNTER — Other Ambulatory Visit (HOSPITAL_COMMUNITY): Payer: Self-pay | Admitting: Nephrology

## 2010-11-26 DIAGNOSIS — N186 End stage renal disease: Secondary | ICD-10-CM

## 2010-12-09 ENCOUNTER — Other Ambulatory Visit: Payer: Self-pay | Admitting: Cardiology

## 2010-12-11 ENCOUNTER — Other Ambulatory Visit (HOSPITAL_COMMUNITY): Payer: BC Managed Care – PPO

## 2010-12-11 ENCOUNTER — Ambulatory Visit (HOSPITAL_COMMUNITY): Payer: BC Managed Care – PPO

## 2011-01-08 ENCOUNTER — Ambulatory Visit: Payer: Medicare Other | Admitting: Internal Medicine

## 2011-01-10 ENCOUNTER — Other Ambulatory Visit: Payer: Self-pay | Admitting: Cardiology

## 2011-01-16 ENCOUNTER — Ambulatory Visit: Payer: Medicare Other | Admitting: Vascular Surgery

## 2011-01-22 NOTE — Assessment & Plan Note (Signed)
[facility]   NAME:  Briana Gould, Briana Gould        ACCOUNT NO.:  [id number]   MEDICAL RECORD NO.:  [id number]      DATE OF BIRTH:  May 11, 1946   PHYSICIAN:  Jonelle Sports. Sevier, M.D.  VISIT DATE:  11/28/2008                                   OFFICE VISIT   HISTORY OF PRESENT ILLNESS:  This 65 year old black female is followed  for a wound on the posterior aspect of her distal right lower extremity  that was thought to be perhaps a combination of a venous and arterial  ulcer.   After getting this wound cleaned up, she had Apligraf application  approximately 4 weeks ago.  She has had progressive improvement in the  wound since then with move toward healing.   She is aware of no problems since her most recent visit here 1 week ago.   PHYSICAL EXAMINATION:  Blood pressure 161/77, pulse 73, respirations 18,  and temperature 98.1.  Random blood glucose this morning 130 mg/dL.   The wound now measures 1.7 x 1.3 x 0.1 cm, is showing evidence for  progressive epithelialization, but does have some loose and slightly  heaped-up skin at the wound margins.   IMPRESSION:  Satisfactory course following Apligraf application of  complex wound, right lower extremity.   DISPOSITION:  The wound is selectively debrided of the small amount of  loose skin at its margins which should provide some stimulation for  continued healing as well.   It was then treated with an application of hydrogel, dry pad, and a  local Kerlix, Coban dressing, held in place by a fishnet stocking.   The patient will be seen in 1 week by the nurse for dressing change and  in 2 weeks by the physician.           ______________________________  Jonelle Sports. Cheryll Cockayne, M.D.     RES/MEDQ  D:  11/28/2008  T:  11/29/2008  Job:  [id number]

## 2011-01-22 NOTE — Assessment & Plan Note (Signed)
[facility]   NAME:  [name], [name] NO.:  [id number]   MEDICAL RECORD NO.:  [id number]      DATE OF BIRTH:  [birth year]   PHYSICIAN:  Maxwell Caul, M.D.      VISIT DATE:                                   OFFICE VISIT   Briana Gould is being followed here for a largely ischemic wound  on the posterolateral aspect of her right lower extremity.  This has  occurred in the setting of very significant peripheral vascular disease.  I do not believe she has had an arteriogram because of severe and  progressive chronic renal failure.  She has had frequent visits for  attempts at debridement of this area which is maintained a very adherent  necrotic eschar.  This was refractory to Santyl.  I have been trying to  see her 2 weeks to manually debride this down to a healthy wound base.   On examination, her temperature is 98.  The wound has not really changed  in dimension measuring 2 x 1.7 x 0.8.  Once again, this area underwent a  difficult nonexcisional debridement of adherent eschar, although I do  think we are making progress and cleaning up the surface of this wound.   IMPRESSION:  Arterial insufficiency wound, right lateral leg get.  This  underwent a difficult debridement as noted above, ultimately requiring  injectable lidocaine.  I think we are making some progress.  However, I  have asked for prior approval for an Apligraf.  I have some thoughts  about hyperbaric oxygen whether this would be a reasonable alternative  in this diabetic patient with a difficult wound on her right leg and a  previous left amputation.  Truthfully, the idea of an Apligraf and  hyperbarics are not mutually exclusive.  However, all of these would  need a clean wound base before we proceeded.  We will see her again  early next week.           ______________________________  Maxwell Caul, M.D.     MGR/MEDQ  D:  09/15/2008  T:  09/16/2008  Job:   [id number]

## 2011-01-22 NOTE — Assessment & Plan Note (Signed)
[facility]   NAME:  Briana Gould, MASCARO NO.:  [id number]   MEDICAL RECORD NO.:  [id number]      DATE OF BIRTH:  08-18-1946   PHYSICIAN:  Maxwell Caul, M.D.      VISIT DATE:                                   OFFICE VISIT   LOCATION:  [facility].   Mrs. Lich is a lady with a largely ischemic wound on the posterior  lateral aspect of her right lower extremity.  This occurs in the setting  of very significant peripheral vascular disease.  She has chronic renal  insufficiency, which has precluded an arteriogram in the past.  When she  first presented, this was a necrotic area, which I have serially  debrided down to a reasonably healthy base.  I placed an Apligraf on  this wound on January 18.  I really have not seen it since.  She  recently had calcium alginate with a dry dressing.  The drainage has  since improved.   PHYSICAL EXAMINATION:  VITAL SIGNS:  Her blood pressure is elevated, but  she is not febrile.  Wound exam shows a much more vibrant-looking granulation base of the  wound.  I lightly debrided some callus from the circumference of this  wound; however, I did not debride the wound itself, although I think it  will need it eventually.   IMPRESSION:  Ischemic wound right leg in the setting of known peripheral  vascular disease, type 2 diabetes, and peripheral vascular disease.  I  think the wound did respond to initial application of an Apligraf, I  would like to reapply on at least one occasion.  I discussed this  extensively with the patient and her daughter.  The base of this wound  looked much healthier, and my thought process would be that the trend  towards healing would be augmented by a second Apligraf application.  They have agreed.  For now, I applied Promogran and hydrogel under an  Unna wrap.  The daughter will change this.  Hopefully, we will reapply  an Apligraf in a week.     ______________________________  Maxwell Caul, M.D.     MGR/MEDQ  D:  10/24/2008  T:  10/25/2008  Job:  [id number]

## 2011-01-22 NOTE — Assessment & Plan Note (Signed)
[facility]   NAME:  Briana Gould, Briana Gould        ACCOUNT NO.:  [id number]   MEDICAL RECORD NO.:  [id number]      DATE OF BIRTH:  21-Feb-1946   PHYSICIAN:  Leonie Man, M.D.         VISIT DATE:                                   OFFICE VISIT   PROBLEM:  Right lateral leg ulcer.  Current dimensions:2.4 x1.8x0.2 cm   HISTORY OF PRESENT ILLNESS:  The patient is a 64 year old female who is  status post left below knee amputation.  She has a right-sided lateral leg ulcer which has been last measured at  2.4 x 1.8 cm in surface size and 0.2 cm deep.  The patient is being  treated with mild compression with dry dressing.  She is having a fair  amount of serosanguinous drainage. at this time.   EXAMINATION/  VSS  On examination, the wound is about 90% granulated and,  10% shows  fibrinous necrosis.  There is no odor.  There is moderate drainage.   TREATMENT/  I did both an excisional  and curette debridement of the wound base  following anesthesia injecting 1% Xylocaine in a field block.  Debridement was taken to bleeding granulations   WOUND DRESSINGS/>    We will use calcium alginate and a mild compression wrap for this  wound   DISPOSITION/  Schedule follow up in 1 weekn for re-evaluation.      Leonie Man, M.D.  Electronically Signed     PB/MEDQ  D:  10/11/2008  T:  10/12/2008  Job:  [phone]

## 2011-01-22 NOTE — Assessment & Plan Note (Signed)
OFFICE VISIT   NASHALIE, SALLIS  DOB:  Jan 24, 1946                                       11/07/2008  ZOXWR#:[id number]   This is a no-charge visit for her today.   Jorge Ny, MD  Electronically Signed   VWB/MEDQ  D:  11/07/2008  T:  11/08/2008  Job:  [id number]

## 2011-01-22 NOTE — Assessment & Plan Note (Signed)
[facility]   NAME:  [name], [name] NO.:  [id number]   MEDICAL RECORD NO.:  [id number]      DATE OF BIRTH:  Aug 14, 1946   PHYSICIAN:  Maxwell Caul, M.D. VISIT DATE:  09/12/2008                                   OFFICE VISIT   Mrs. Briana Gould is a lady we have been following for a right leg wound, felt  most likely to be secondary to arterial insufficiency, but also venous  insufficiency.  The problem largely has been the formation of a very  adherent eschar which has required serial debridements here and really  has been refractory to Santyl.  Dr. Eloise Harman changed her to Iodosorb  with a bulky dressing and an Ace wrap last time.   On examination, her temperature is 97.7.  The area on her right leg  measures 1.9 x 1.6 x 0.2.  Again, this is covered by a very thick  adherent eschar.  This was debrided with some difficulty requiring  lidocaine injectable for anesthesia.  Once again, this does not appear  to be infected and nothing was cultured.   IMPRESSION:  Venous stasis wound in the setting of chronic severe  arterial insufficiency.  I continued the Iodosorb as outlined last time  by Dr. Eloise Harman.  Santyl was not helpful.  We are limited to what we can  get into the home in terms of supplies.  I would also look into the  history of her arterial insufficiency to make sure that she is not a  candidate for revascularization, although I do not believe she is.   We reapplied the Iodosorb gauze and an Ace wrap.  I will see her later  in the week to see if more frequent debridements would be helpful in  maintaining the clean base to this wound.           ______________________________  Maxwell Caul, M.D.     MGR/MEDQ  D:  09/12/2008  T:  09/13/2008  Job:  [id number]

## 2011-01-22 NOTE — Assessment & Plan Note (Signed)
[facility]   NAME:  Briana Gould, Briana Gould        ACCOUNT NO.:  [id number]   MEDICAL RECORD NO.:  [id number]      DATE OF BIRTH:  April 11, 1946   PHYSICIAN:  Barry Dienes. Eloise Harman, M.D.     VISIT DATE:                                   OFFICE VISIT   SUBJECTIVE:  The patient is a 65 year old woman of African American  descent who is being followed for a right leg wound felt most likely due  to arterial insufficiency.  She has a history of a right lower  extremity, ABI of 0.22 and due to renal insufficiency with baseline  serum creatinine of 3.7, was unable to have an angiogram.  She has not  had significant pain in the area, although she does have cramps in her  legs at night.  She has not had recent fever or chills.  At last visit,  she was instructed to apply Santyl to her wounds covered by a moist  gauze and an Ace wrap.   OBJECTIVE:  VITAL SIGNS:  Blood pressure 180/92, pulse 75, respirations  16, temperature 98.3, most recent capillary blood glucose level was 128.   The condition of the wound is as follows:  Wound #1:  It is located on the right leg in the posterior lateral  aspect and measures 1.7 cm x 1.8 cm x 0.2 cm.  There is no significant  eschar and a moderate amount of necrotic tissue within the wound.  Wound  base is red-to-yellow in color with red granulation tissue.  There was  no exposed bone, tendon, or muscle, no foul odor, and a small amount of  yellow and serosanguineous discharge.  The periwound integrity was  intact.  There is distal right leg hyperpigmentation suggestive of  chronic venous insufficiency.   ASSESSMENT:  Stable right leg ulcer likely of mixed etiology, primarily  from arterial insufficiency with a component of venous insufficiency.  Unfortunately, her arterial insufficiency precludes use of a compression  wrap.  The necrotic tissue has improved a great deal with Santyl  chemical debridement.   PLAN:  Lidocaine 2% was injected  circumferentially around the ulcer to  obtain local anesthesia.  This look quite well.  Following injection of  2% lidocaine of approximately 2 mL, a #15 scalpel was used to debride  the small amount of necrotic material from within the ulcer.  This was  well tolerated without significant pain or bleeding.  Following this,  the ulcer was treated with Iodosorb covered by gauze, a bulky dressing,  and an Ace wrap.  She was given a syringe of Iodosorb to use sparingly  on the wound as an outpatient.  She was advised to change the dressing  every other day.  In addition, she was advised to take zinc sulfate 220  mg once daily to optimize wound healing.  It was discussed with her that  if there is not significant improvement in her wound at next visit, then  we will consider further evaluation to see if she is an appropriate  candidate for hyperbaric oxygen treatments.           ______________________________  Barry Dienes. Eloise Harman, M.D.     DGP/MEDQ  D:  08/30/2008  T:  08/31/2008  Job:  [id number]

## 2011-01-22 NOTE — Assessment & Plan Note (Signed)
[facility]   NAME:  Briana Gould, Briana Gould        ACCOUNT NO.:  [id number]   MEDICAL RECORD NO.:  [id number]      DATE OF BIRTH:  [birth year]   PHYSICIAN:  Maxwell Caul, M.D. VISIT DATE:  09/26/2008                                   OFFICE VISIT   HISTORY:  Briana Gould has been followed here for an ischemic  wound in the posterolateral aspect of her right lower extremity.  This  occurs in the setting of very significant PVD with very poor ABIs in the  past.  She has not had a recent arteriogram secondary to chronic renal  insufficiency as well.   Her wound has been undergoing a difficult set of debridements to get  down to a clean granulating base.  This was refractory to Santyl, which  was completely unhelpful in chemical debridement of this area.  Most  recently, we have been using Iodosorb.  She returns today for an  Apligraf placement.   PHYSICAL EXAMINATION:  Her temperature is 98.1, pulse 77, respirations  20, CBG 132.  The wound measured 2.2 x 1.7 x 0.2.  Unfortunately, this  once again had a very adherent tan-colored eschar.  This required  copious amounts of lidocaine to debride down to a granulating base that  I could apply an Apligraf to.  This was done with injectable lidocaine.  Even with this, she does not tolerate debridement well.  Nevertheless,  after debridement, we placed an Apligraf fixed with Mepitel and Steri-  Strips.  I put a SofSorb pad over the Mepitel in order to try and help  with any drainage and to keep the Apligraf applied to the wound, which  has some depth to it.  The Ace wrap will be placed over this and changed  by the patient's daughter.   IMPRESSION:  Ischemic wounds in the setting of diabetes, right lateral  leg.  I have continued to debride these.  I did with some difficulty.  We applied an Apligraf to this area.  My plan would be to continue  Apligraf placements.  At some point in time if this does not help,  we  will check a transcutaneous oxygen monitor on her to see what her  periwound oxygenation is.  However, I would like to give at least one  Apligraf a chance to see whether it can further granulate this area.           ______________________________  Maxwell Caul, M.D.     MGR/MEDQ  D:  09/26/2008  T:  09/27/2008  Job:  [id number]

## 2011-01-22 NOTE — Consult Note (Signed)
NEW PATIENT CONSULTATION   OCTOBER, PEERY  DOB:  1946-02-26                                       05/29/2007  ZOXWR#:[id number]   Briana Gould presents today for continued followup of her lower  extremity arterial insufficiency.  I had seen her 4 years ago for  similar concerns.  She does have a history of failed left fem-pop bypass  at another institution, and subsequently left below-the-knee amputation.  She does have known arterial insufficiency in her right foot.  Fortunately she has had no tissue loss or other major vascular  difficulties in the 4 years since I had seen her.  She denies any calf  claudication.  She does not have any what would be felt to be true rest  pain in her right foot.  She does report cramping in her distal right  calf that occurs several nights per week and is relieved by standing and  stretching her calf muscle.  She does not have any tissue loss.  Particularly no foot blisters, ingrown toenails or pain in her foot  itself.  Her history is significant for diabetes, hypertension, elevated  cholesterol.   HABITS:  She does not smoke or drink alcohol.   REVIEW OF SYSTEMS:  Positive for shortness of breath with exertion,  anemia.   ALLERGIES:  She has no known drug allergies.   MEDICATIONS:  Noted in her chart.   PHYSICAL EXAMINATION:  General:  Well-developed, well-nourished black  female appearing stated age of 19.  Vital Signs:  Blood pressure is  158/74, pulse 78, respirations 18.  Her radial pulses are 2+  bilaterally.  She has absent right popliteal and distal pulses.  She  does have a below-the-knee prosthesis on the left.  I have a copy of her  noninvasive vascular laboratory studies from [facility] on  04/17/2007.  This reveals incompressible tibial vessels due to  calcification, therefore ankle indices not obtainable.  She does have  dampened monophasic tibial signals bilaterally.   I discussed  this at length with Briana Gould.  I explained that  this does appear to be stable from 4 years ago.  I do not feel she is  having arterial rest pain and feel this is more related to typical calf  cramping at night.  She was again instructed on foot care and the  importance of notifying us should she develop any ulcerations.  Otherwise we will see her again on an as-needed basis.   Larina Earthly, M.D.  Electronically Signed   TFE/MEDQ  D:  05/29/2007  T:  06/01/2007  Job:  [id number]   cc:   Margaretmary Bayley, M.D.

## 2011-01-22 NOTE — Assessment & Plan Note (Signed)
[facility]   NAME:  Briana, Gould        ACCOUNT NO.:  [id number]   MEDICAL RECORD NO.:  [id number]      DATE OF BIRTH:  1946-05-03   PHYSICIAN:  Maxwell Caul, M.D. VISIT DATE:  09/19/2008                                   OFFICE VISIT   Briana Gould is being followed here for an ischemic wound on the  posterior lateral aspect of her right lower extremity.  This occurs in  the setting of very significant peripheral vascular disease with very  poor ABIs in the past.  I do not believe she ever had a arteriogram  since she has significant chronic renal insufficiency as well.   Her wound has been undergoing a difficult set of debridement to get down  to a clean granulated wound bed.  This was refractory to Santyl.  Most  recently, we have been using Iodosorb.  I had asked for prior approval  for an Apligraf last week, although we have not heard back on this.   PHYSICAL EXAMINATION:  VITAL SIGNS:  Temperature is 98.1, blood pressure  180/80, and blood glucose 121.  The area of her ulcer is 2 x 1.5 x 0.2.  This has a much cleaner albeit pale wound bed.  I went ahead and  debrided callus from the walls and periphery of the wound to try and  free these up for possible healing.   IMPRESSION:  Arterial insufficiency wounds in the setting of diabetes,  right lateral leg.  I have continued to debride these, although I did  not need to touch the wound bed today.  We will continue with the  Iodosorb regimen to keep the wound bed clean and free of infection.  I  have asked for an Apligraf and my plan would be to apply a series of  these to see if we can stimulate granulation.  She had asked or at least  her daughter and asked about hyperbaric oxygen and she may not be an  unreasonable candidate for this given her diabetes status and severe  ischemic disease.  She has already had a previous left amputation.   We will see her again in a week.  We had been  doing frequent  debridements here.  I do not think this is currently necessary.  I would  like to apply the Apligraf as soon as possible.           ______________________________  Maxwell Caul, M.D.     MGR/MEDQ  D:  09/19/2008  T:  09/20/2008  Job:  [id number]

## 2011-01-22 NOTE — Assessment & Plan Note (Signed)
[facility]   NAME:  [name], [name]        ACCOUNT NO.:  [id number]   MEDICAL RECORD NO.:  [id number]      DATE OF BIRTH:  11-07-1945   PHYSICIAN:  Maxwell Caul, M.D. VISIT DATE:  08/15/2008                                   OFFICE VISIT   Briana Gould is a 65 year old woman who has a right leg ulcer in  the setting of known arterial insufficiency.  She has previously had an  ankle-brachial index of 0.22, and she is in chronic renal insufficiency  with a creatinine of 3.7.  When she arrived here last week, she had 2  wounds on her right lateral leg.  Both of these required debridement.  We have applied Santyl with largely under an ACE wrap.   On examination, her temperature is 98.2.  The wound we have labeled #2  is almost unmeasurably small, although it is present after some removal  of some callus.  The area that we have labeled #1 measures 1.8 x 0.6 x  0.1, this is covered by a very adherent tan-colored eschar.  We needed  1% lidocaine injectable in order to anesthetize this area enough to have  some attempt to debridement.  Even then it was minimally successful.   IMPRESSION:  Arterial insufficiency wounds, underwent debridement noted  above.  We will continue with Santyl with a moist gauze and a dry  dressing under an ACE wrap.  This is being changed at home by her  daughter.           ______________________________  Maxwell Caul, M.D.     MGR/MEDQ  D:  08/15/2008  T:  08/16/2008  Job:  [id number]

## 2011-01-22 NOTE — Assessment & Plan Note (Signed)
[facility]   NAME:  Briana, Gould        ACCOUNT NO.:  [id number]   MEDICAL RECORD NO.:  [id number]      DATE OF BIRTH:  [birth year]   PHYSICIAN:  Maxwell Caul, M.D.      VISIT DATE:                                   OFFICE VISIT   Briana Gould is a the patient we saw here for the first time 2 weeks ago.  She has a difficult venous stasis wound on her right lateral leg.  This  does not have significant infection.  We have been using Santyl and  serial debridements to the right leg wound.  She returns today in  followup.   PHYSICAL EXAMINATION:  Her temperature is 98.4.  Once again, the area  required injectable lidocaine for anesthesia.  We did a difficult  debridement of adherent white eschar.  Afterwards, we began to see the  beginnings of a cleaner granulating base.   IMPRESSION:  Venous stasis ulceration.  Underwent the nonexcisional  debridement listed above.  She will need to continue the Santyl under a  moist gauze with a dry dressing and an Ace wrap.  This is being changed  by her family.  We will see her again next week.           ______________________________  Maxwell Caul, M.D.     MGR/MEDQ  D:  08/22/2008  T:  08/23/2008  Job:  [id number]

## 2011-01-22 NOTE — Assessment & Plan Note (Signed)
[facility]   NAME:  Briana Gould, Briana Gould        ACCOUNT NO.:  [id number]   MEDICAL RECORD NO.:  [id number]      DATE OF BIRTH:  [birth year]   PHYSICIAN:  Leonie Man, M.D.         VISIT DATE:                                   OFFICE VISIT   PROBLEM:  Mixed venous stasis and arterial disease with a healing venous  stasis ulcer of the right lower extremity laterally.  The patient is a  status post left below-knee amputation for peripheral vascular disease  associated with diabetes, hypertension.   On evaluation today, the patient is alert, oriented, and in no distress.  She is afebrile.  Temperature 98.4, pulse 76, respirations 20, blood  pressure 182/81.  Capillary blood glucose is 130.   On inspection of the right lateral leg, the ulcer is now completely  healed with full epithelialization without re-pigmentation of the ulcer.  There is no tenderness, redness. or drainage.   I have recommended that she use tube grips for increased compression and  to continue that compression as long as she possibly can.  We will  follow up with her p.r.n.      Leonie Man, M.D.  Electronically Signed     PB/MEDQ  D:  12/12/2008  T:  12/13/2008  Job:  [id number]

## 2011-01-22 NOTE — Assessment & Plan Note (Signed)
[facility]   NAME:  LACHLAN, PELTO        ACCOUNT NO.:  [id number]   MEDICAL RECORD NO.:  [id number]           DATE OF BIRTH:   PHYSICIAN:  Leonie Man, M.D.    VISIT DATE:  10/17/2008                                   OFFICE VISIT   PROBLEM:  Venous stasis and peripheral vascular disease with a right  lateral leg venous stasis ulcer measuring 2.4 x 1.7 x 0.2 cm.   The patient's most recent ankle brachial index was measured at  approximately 0.27 making it inappropriate for any significant  compression stockings.  The wound, however, is healing quite well at  this stage with some serous drainage secondary to her leg edema which is  rather significantly decreased since the last visit.  She has previously  been on Apligraf which produced a significantly good granulation.  She  still has a small amount of fibrinous exudate at the base of the wound.  The wound edges are clean and somewhat hyperpigmented, but there is no  rolling of the edges.  The feet are warm.  There are no palpable pulses  on today's evaluation.  When she was last seen, I treated her with  calcium-alendronate and a light compressive dressing using Kerlix with  an ACE wrap.  Today's treatment will consist of the same treatment with  calcium-alendronate, dry dressing, and an ACE wrap over a Kerlix  dressing.  I will see her again in 1 week for reevaluation of this  wound.      Leonie Man, M.D.  Electronically Signed     PB/MEDQ  D:  10/17/2008  T:  10/18/2008  Job:  [id number]

## 2011-01-22 NOTE — Assessment & Plan Note (Signed)
[facility]   NAME:  RHYLIN, VENTERS NO.:  [id number]   MEDICAL RECORD NO.:  [id number]      DATE OF BIRTH:  Briana Gould, Briana Gould   PHYSICIAN:  Maxwell Caul, M.D.      VISIT DATE:                                   OFFICE VISIT   Briana Gould is a lady with a largely ischemic wound on the posterolateral  aspect of her right lower extremity.  This occurs in the setting of very  significant peripheral vascular disease.  She has chronic renal  insufficiency which has precluded an arteriogram in the past.  This area  required serial debridements down to a reasonably healthy base.  She had  her first Apligraf on September 26, 2008, with reasonable improvement in  the appearance of the wound.   On examination, she is afebrile.  Wound shows a more vibrant granulated  base.  I lightly debrided some surface slough and some fibrous material  from the wound bed.   IMPRESSION:  Ischemic wound, right leg.  This is in the setting of  peripheral vascular disease and type 2 diabetes.  I reapplied an  Apligraf to this wound.  Packed the base of this with hydrogel gauze as  there is some depth, covered this with Mepitel and put her in an Ace  wrap.  We will see her again for a nurse visit in a week and an MD visit  in two.           ______________________________  Maxwell Caul, M.D.     MGR/MEDQ  D:  10/31/2008  T:  11/01/2008  Job:  [id number]

## 2011-01-22 NOTE — Assessment & Plan Note (Signed)
OFFICE VISIT   Briana Gould, Briana Gould  DOB:  1945/09/29                                       08/01/2008  [id number]#:[id number]   REASON FOR VISIT:  Right leg ulcer.   HISTORY:  This is a 65 year old female I am seeing at the request of  Casimiro Needle and Margaretmary Bayley for evaluation of a right leg ulcer.  The  patient has a significant vascular history.  Approximately 8 years ago  she underwent left femoral popliteal bypass graft of vein which,  according to her, was patent for approximately 7 days.  She ultimately  underwent amputation of the left leg.  She has been doing well with her  prosthesis, however within the last 2 weeks she has developed a right  leg ulcer.  She has been performing dressing changes to this.  She comes  in today for arterial evaluation.  She denies having any significant  rest pain in her foot.  She complains of an ulcer on the lateral side of  her foot.   PAST MEDICAL HISTORY:  Significant for chronic kidney disease, diabetes,  hypertension, peripheral vascular disease, obesity.   PAST SURGICAL HISTORY:  Left fem-pop and left leg amputation.   SOCIAL HISTORY:  She does not drink, she does not smoke.   MEDICATIONS:  Labetalol, Plavix, Amaryl, furosemide, Norvasc,  Calcitriol, Procrit, aspirin, Cardura.   ALLERGIES:  None.   PHYSICAL EXAMINATION:  Vital Signs:  Her blood pressure is 200/95, pulse  72, respirations 20, temperature is 98.3.  General:  She is well-  appearing, in no distress, she is obese.  Cardiovascular:  Regular rate  and rhythm.  Left leg is surgically absent.  Right leg shows a 2 cm  ulcer on the lateral aspect of the lower third of her leg.  There is no  gross purulence, this is relatively superficial.   DIAGNOSTIC STUDIES:  Duplex evaluation shows an ankle brachial index of  0.22 on the right.   Recent creatinine was 3.7.   ASSESSMENT/PLAN:  Right leg ulcer.   PLAN:  This is a very complicated  situation given the patient's vascular  disease as well as her renal insufficiency.  The patient needs to have  further evaluation of her lower extremity arterial tree; however, this  would need to be done with contrast, and therefore she would be at risk  for going on dialysis.  Since her ulcer has only been present for  approximately 2 weeks, I would like to see if she could get this to heal  with wound care alone.  She has got an appointment to see the wound  center, I am hoping that we can get this to heal with nonoperative  measures.  I am concerned that if we had to proceed with an arteriogram  that she would could end up on dialysis.  She knows that this is a limb-  threatening situation.  I am going to have her come back to see me in 3  weeks and evaluate the status of her wound.   Jorge Ny, MD  Electronically Signed   VWB/MEDQ  D:  08/01/2008  T:  08/02/2008  Job:  [id number]   cc:   Margaretmary Bayley, M.D.  Mindi Slicker. Lowell Guitar, M.D.

## 2011-01-22 NOTE — Consult Note (Signed)
NEW PATIENT CONSULTATION   Briana Gould, Briana Gould  DOB:  03-22-1946                                       04/03/2009  [id number]#:[id number]   REASON FOR VISIT:  Permanent access.   REFERRING PHYSICIAN:  Mindi Slicker. Lowell Guitar, M.D.   HISTORY:  This is a 65 year old female with chronic kidney disease not  yet requiring dialysis.  She is sent for evaluation of permanent access.  She is right-handed.  Her renal insufficiency is due to diabetes.   PAST MEDICAL HISTORY:  1. Chronic kidney disease.  2. Hypertension.  3. Peripheral vascular disease, status post left below knee amputation      and history of right heel ulcer.  4. Obesity.  5. Diabetes.  6. Cholelithiasis.  7. Secondary hyperparathyroidism.  8. Volume overload.   MEDICATIONS:  Please see medical record.   SOCIAL HISTORY:  Negative for alcohol, negative for tobacco.   PAST SURGICAL HISTORY:  Left fem-pop, left leg amputation.   ALLERGIES:  None.   PHYSICAL EXAMINATION:  Blood pressure is 186/76, pulse 81.  She is well-  appearing in no distress.  She is right-handed.  Cardiovascular:  Regular rate and rhythm.  Pulmonary:  Lungs are clear bilaterally.  Abdomen:  Soft.  She has palpable right brachial pulse as well as right  radial pulse.   DIAGNOSTIC STUDIES:  Vein mapping was performed today.  She has adequate  right cephalic vein beginning at the antecubital crease.   PLAN:  The patient will be scheduled for a right upper arm AV fistula.  She will contact our office for the date.  I discussed the risks of not  maturity, the risk of steal syndrome with her.  She understands all  this.  She understands that she may require additional operations to get  her vein to mature.  The patient again will call and schedule her  operation.   Jorge Ny, MD  Electronically Signed   VWB/MEDQ  D:  04/03/2009  T:  04/05/2009  Job:  [id number]   cc:   Mindi Slicker. Lowell Guitar, M.D.

## 2011-01-22 NOTE — Procedures (Signed)
CEPHALIC VEIN MAPPING   INDICATION:  Preop evaluation for dialysis access.   HISTORY:  Chronic kidney disease.   EXAM:  The right cephalic vein is compressible with diameter  measurements ranging from 0.12 cm to 0.59 cm.   The left cephalic vein is compressible with diameter measurements  ranging from 0.31 cm to 0.46 cm.   IMPRESSION:  Patent bilateral cephalic veins with diameters as described  above and on the attached worksheet.   ___________________________________________  V. Charlena Cross, MD   CH/MEDQ  D:  04/03/2009  T:  04/04/2009  Job:  [id number]

## 2011-01-22 NOTE — Assessment & Plan Note (Signed)
[facility]   NAME:  Briana Gould, Briana Gould        ACCOUNT NO.:  [id number]   MEDICAL RECORD NO.:  [id number]      DATE OF BIRTH:  [birth year]   PHYSICIAN:  Leonie Man, M.D.    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   This was treated approximately a week ago with an Apligraf and the  patient returns today for evaluation.   On examination, the wound is healing quite well with good  epithelialization continues.  No areas of necrosis or fibrin.  The wound  is closing fairly rapidly at this point.   TREATMENT/DISP  Continue her on hydrogel, Mepitel, Kerlix, and an Ace wrap, and follow  up in 1 week.      Leonie Man, M.D.  Electronically Signed     PB/MEDQ  D:  11/23/2008  T:  11/24/2008  Job:  [id number]

## 2011-01-22 NOTE — Assessment & Plan Note (Signed)
[facility]   NAME:  Briana Gould, Briana Gould        ACCOUNT NO.:  [id number]   MEDICAL RECORD NO.:  [id number]      DATE OF BIRTH:  [birth year]   PHYSICIAN:  Leonie Man, M.D.    VISIT DATE:  11/14/2008                                   OFFICE VISIT   PROBLEM:  Mixed venous and arterial disease with a right lateral leg  nonhealing ulcer.  This was treated approximately 2 weeks ago with an  Apligraf and the patient returns today for evaluation.   On examination, the wound is healing quite well and epithelializing  without any areas of necrosis of her fibrin.  Continue her on hydrogel,  Mepitel, Kerlix, and an Ace wrap, and follow up in 1 week.      Leonie Man, M.D.  Electronically Signed     PB/MEDQ  D:  11/14/2008  T:  11/15/2008  Job:  [id number]

## 2011-01-22 NOTE — Assessment & Plan Note (Signed)
[facility]   NAME:  Gould, Briana        ACCOUNT NO.:  [id number]   MEDICAL RECORD NO.:  [id number]      DATE OF BIRTH:  [birth year]   PHYSICIAN:  Maxwell Caul, M.D.      VISIT DATE:                                   OFFICE VISIT   Briana Gould is a 65 year old woman who is seen here at the  request of Dr. Chestine Spore for evaluation of a right leg ulcer.  This patient  has a history of significant peripheral vascular disease.  She underwent  a left fem-pop 8 years ago, but it subsequently went on to have a left  below-knee amputation for a ulcer on her left leg.  Three weeks ago, she  developed a small pimple on the lateral aspect of her right leg.  This  opened and progressed and she has 2 small ulcers on the lateral aspect  of her leg currently.  She has seen Dr. Myra Gianotti of Vascular Surgery.  He  makes reference to the fact that her duplex evaluation shows ankle-  brachial index of 0.22.  He could not easily go on to do an arteriogram  secondary to the fact the patient has severe chronic renal insufficiency  with a recent creatinine of 3.7.  She is here for wound care to see if  we can heal this without further surgical intervention.   PAST MEDICAL HISTORY:  1. Chronic renal insufficiency.  2. Type 2 diabetes.  3. Hypertension.  4. Peripheral vascular disease.  5. Anemia of chronic renal insufficiency, on Procrit.  6. History of coronary artery disease.  7. Prior left leg amputation.   SOCIAL HISTORY:  No history of tobacco or alcohol abuse.  She lives  alone but has family support.   Medication list is reviewed.  She is on Plavix and aspirin at 81 mg.  Otherwise, she takes labetalol, Amaryl, Lasix, calcitriol, Cardura, and  Procrit monthly.   On examination, palpable pulses in her right foot are absent.  There is  mild amount of edema here, but no other convincing evidence of  congestive heart failure.  The wounds on the right lateral leg  are  actually 2.  Both of these were covered with a thick adherent eschar and  both underwent nonexcisional debridement.  On the larger of these wounds  which we have labeled #1, I could not fully debride this down to a  granulating base due to discomfort.  We will need to continue Santyl  debridement for the better part of the next week.   IMPRESSION:  1. Probably arterial insufficiency wounds on the right.  The patient      has had a previous wound just medial to the 2 described above that      actually healed.  We are going ahead and apply Santyl to both of      these wounds with a moist gauze, a dry dressing under an Ace wrap.      The patient will apply these herself with help from her family.  We      will see this again next week.  2. Severe peripheral vascular disease as indicated by an ankle-      brachial index of 0.22.  The patient does not  describe claudication.  She does describe cramps in her legs at      night; however, this does not happen when she is up walking.  Right      now, the limiting factor may indeed be her vascular supply.  She      does not have any evidence of surrounding infection to these      wounds.  I will see her again in a week.           ______________________________  Maxwell Caul, M.D.     MGR/MEDQ  D:  08/08/2008  T:  08/09/2008  Job:  [id number]   cc:   Margaretmary Bayley, M.D.  Mindi Slicker. Lowell Guitar, M.D.  Jorge Ny, MD

## 2011-01-25 NOTE — Procedures (Signed)
[place]  Patient:    Briana Gould, Briana Gould                        MRN: [id number] Proc. Date: 02/06/01 Adm. Date:  [id number] Attending:  Berry, Jonathan [place] CC:         Peripheral Vascular/Angiographic Suite, 6th floor [place]. Chestine Spore, M.D.  Zigmund Daniel, M.D.  [facility],  [address], [place], [place]  [address]   Procedure Report  INDICATIONS:  Briana Gould is a 65 year old black female, referred for evaluation of left lower extremity claudication.  She was found to have diminished left ABI.  She was angiogramed at [facility] and found to have a short segment of occlusion of the distal left SFA with one-vessel runoff.  She presents now for attempted percutaneous revascularization for relief of symptoms of claudication.  The patient expressed a wish not to undergo surgical revascularization unless absolutely necessary.  PROCEDURE DESCRIPTION:  The patient was brought to the sixth floor [facility] Peripheral Vascular Angiographic Suite in the postabsorptive state. She was premedicated with p.o. Valium.  Her right groin was prepped and shaved in the usual sterile fashion.  One percent Xylocaine was used for local anesthesia.  A 5-French sheath was inserted into the right femoral artery using the standard Seldinger technique.  A 5-French rim catheter, as well as a 6-French end-hole catheter, and an .035 Wholey wire were used for left lower extremity angiography of runoff, obtaining contralateral access.  Visipaque dye was used for the entirety of the gas.  Retrograde aortic pressure was monitored during the gas.  ANGIOGRAPHIC RESULTS: 1. There is a short segment distal left SFA occlusion with luxurious    collaterals. 2. The distal left SFA reconstituted at [facility] canal.  There was a 50%    segmental popliteal stenosis, followed by a distal 99% stenosis in the    popliteal  continuing into the anterior tibia and tibioperoneal trunk. 3. There was one-vessel runoff near the anterior tibialis, which was diffusely    diseased.  PROCEDURE DESCRIPTION:  Contralateral access was obtained with a 7-French crossover Balkan sheath.  The [facility] wire was advanced to the site of total occlusion.  The patient received 3000 units of heparin.  A long end-hole catheter was then advanced to the beginning of the lesion.  The lesion was probed with the [facility] wire unsuccessfully.  A .035 angled glide wire was then used in an attempt to cross the lesion, again unsuccessfully.  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  IMPRESSION:  Unsuccessful percutaneous attempt to revascularize a short segment total occlusion of the left superficial femoral artery for relief of symptoms of limiting claudication.  I do not think the patient is a good surgical candidate because of the distal popliteal, anterior tibialis, and tibioperoneal trunk disease.  She would need a femoral distal anterior tibial bypass graft which has low long-term patency.  Plavix and Pletal will be recommended.  The ACT was measured and the sheaths removed.  Pressure was held on the groin to achieve hemostasis.  The patient left the laboratory in stable condition. She will be hydrated and discharged home later today.  She will see me back in the office in one to two weeks.  Dr. Marcy Panning and Dr. Margaretmary Bayley were notified of these results. DD:  02/06/01 TD:  02/06/01 Job: [id number]

## 2011-01-25 NOTE — Consult Note (Signed)
[place]  Patient:    Briana Gould, Briana Gould                        MRN: [id number] Proc. Date: 03/13/01 Adm. Date:  [id number] Attending:  Meredith Leeds                          Consultation Report  CHIEF COMPLAINT:  Multiple open wounds of the left lower extremity.  HISTORY OF PRESENT ILLNESS:  Briana Gould is a very nice, 65 year old woman that I am seeing this afternoon at the request of Zigmund Daniel, M.D. Mrs. Boutin pertinent recent history begins from approximately Tehya 10, 2002, when she underwent a left femoral-tibial bypass graft procedure using a reversed saphenous vein graft by Zigmund Daniel, M.D. This was done in an attempt to salvage a severely ischemic left foot. With the exception of some hematoma formation in her left thigh, her immediate postoperative course was apparently uneventful. She did go on to develop breakdown of the left thigh incision on removal of her skin staples approximately postoperative day 11. She was treated for a presumptive wound infection with the administration of oral antibiotics and felt to be recuperating satisfactorily. On the morning of July 1, she was brought emergently to the hospital after developing sudden onset of bleeding from the dorsum of her left foot. EMTs estimated that she lost approximately 1.5 to 2 L of blood by the time she arrived at the emergency department. She was hypotensive and was given O- blood and adequately resuscitated. She was taken on an emergent basis to the operating room in order to control her bleeding which was presumed to come from the area of the distal anastomosis in the foot. According to Dr. Zadie Rhine operative note, the bleeding had stopped at the time of exploration, but there was noted to be infection involving the vein graft just proximal to the anastomosis. The involved portion of the graft was resected, and the bypass graft  ligated proximally.  In the immediate postoperative course, the patient was somewhat oliguric with creatinines that were rising. She was seen by the renal medicine service. This was felt to be probably related to her systemic hypotension and hypovolemia. In fact, the urine output did come back to normal, and the creatinine dropped to normal range within a couple of hospital days.  I was asked to see the patient earlier this afternoon by Zigmund Daniel, M.D. He is considering doing another bypass graft in an attempt to salvage her foot. However, due to her tremendous soft tissue involvement, he wanted to be sure that an adequate reconstruction could be done before submitting the patient to another invasive procedure with its attendant possible morbidity.  PAST MEDICAL HISTORY:  Notable for peripheral vascular disease and history of lower extremity ulcers. This primarily localized to the left lower extremity as she has had very little difficulty with the right leg. She also has type 2 diabetes mellitus. Her hypertension is well controlled.  MEDICATIONS AT TIME OF ADMISSION:  Coreg, Pletal, Glucovance, multivitamins, Restoril, Demadex, and Zestril.  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  PAST SURGICAL HISTORY:  With the exception of the current illness is unremarkable.  REVIEW OF SYSTEMS:  Similarly negative or noncontributory.  PHYSICAL EXAMINATION:  The patient examined in her hospital  room.  EXTREMITIES:  She has an existing dressing in place which we removed. She has a fairly substantial, open wound involving the medial portion of the left thigh which extends through the subcutaneous layer down to a point just above the musculature. This involves approximately 50 to 60% of the length of the thigh medially.  Distal to the knee, there is a healing incision proximally. At approximately the  midpoint of the calf, the wound is opened and remains so down to the level of the foot. There are a couple of parallel incisions on the foot. There is no evidence of granulation tissue activity in any of the wounds distal to the knee. The foot is very cold and malodorous. She does have some warmth on the outside of the calf distally, but it is very cold on the inside of the calf medially. The tissue is very dark. There is obvious gangrene involving some of the digits of the foot. I did not palpate any pulses below the level of the knee on the left. There is a palpable dorsalis pedis pulse on the right. The patient has an unscarred abdomen.  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  RECOMMENDATION:  I do not believe that it would be in the patients best interest to attempt to reconstruct this extensive soft tissue defect. At a minimum, she would need to undergo another vascular bypass graft to the distal foot. Although I am not a vascular surgeon, I would think that it would be somewhat difficult to find a recipient vessel that could be reliably bypassed to. She would also have a requirement to debride most, if not all, of the skin and subcutaneous tissue of the medial portion of the distal left leg, the dorsum of the foot, and probably several toes. The foot is very cold, and I suspect that the small muscles of the foot have already undergone necrosis.  This would be completely unreconstructable without the use of a free flap. There are no local or regional flaps that would suffice. She might actually require the use of two muscle flaps, perhaps a latissimus dorsi and a rectus abdominis muscle flap to close the defect. It would be, at a minimum, many months before she could weightbear. I would put the success of this reconstruction at substantially less than 50%.  I will communicate these findings to Zigmund Daniel, M.D. The patient and family will be appropriately counselled. I believe that she will go on to have a below-the-knee amputation.  For the time being, we will treat the open wound at the thigh with a  vacuum-assisted closure device. I am hopeful that we will be able to get this to satisfactorily granulate and hopefully close on its own without any further surgical intervention. However, she may ultimately require a skin graft depending upon the clinical circumstances. DD:  03/13/01 TD:  03/13/01 Job: [id number]

## 2011-01-25 NOTE — Op Note (Signed)
[place]. [facility]  Patient:    Briana Gould, Briana Gould                        MRN: [id number] Proc. Date: 08/31/00 Adm. Date:  [id number] Disc. Date: [id number] Attending:  Wende Mott                           Operative Report  PREOPERATIVE DIAGNOSIS:  Abscess, cellulitis, osteomyelitis, left foot.  POSTOPERATIVE DIAGNOSIS:  Abscess, cellulitis, osteomyelitis, left foot.  ANESTHESIA:  General.  PROCEDURE:  Incision and drainage of abscess, irrigation and debridement, second metatarsectomy and proximal phalangectomy, second toe, left foot.  SURGEON:  Kennieth Rad, M.D.  DESCRIPTION OF PROCEDURE:  Patient was taken to the operating room after giving adequate preoperative medication and given general anesthesia and intubated.  Left foot was scrubbed with Betadine scrub and painted with Betadine solution and draped in a sterile manner.  Bovie was used for hemostasis.  Dorsal incision was made over the second toe of the left foot and extended all the way down to the second metatarsal.  Purulent material was evacuated, copious irrigation with antibiotic solution and Simpulse irrigation system with thorough debridement.  Cultures were done, aerobic and anaerobic. After adequate debridement, the second metatarsal was resected and the proximal phalanx of the second toe was resected.  There was a plantar corn/callosity with open wound which had also been having drainage from it; this was also thoroughly debrided; this was underneath the second metatarsal head.  After thorough debridement, 1/4-inch Penrose drain was packed into the wound and with exit in plantar aspect.  The wound was packed and left open. Compressive dressing was applied.  Patient tolerated procedure quite well and went to the recovery room in stable and satisfactory condition. DD:  09/25/00 TD:  09/25/00 Job: [id number]

## 2011-01-25 NOTE — H&P (Signed)
[place]. [facility]  Patient:    Briana Gould, Briana Gould                        MRN: [id number] Adm. Date:  [id number] Attending:  Meredith Leeds                         History and Physical  CHIEF COMPLAINT:  Pain in the left foot.  HISTORY OF PRESENT ILLNESS:  The patient is a 65 year old black female who is diabetic and has known peripheral vascular disease. She has had osteomyelitis involving bones of the left foot and underwent debridement with no ______  of evident osteomyelitis. However, she has developed ulceration of the left fourth toe and an ulceration of the anterior leg and has pain in the foot. Doppler pressures were very low. She underwent arteriography demonstrating superficial femoral occlusion with reconstitution of the popliteal but only one vessel runoff and quite diseased anterior tibial artery providing the only runoff. Runell Gess, M.D. attempted a balloon angioplasty but was unable to get the wire across the occluded segment, and patient is admitted to the hospital for revascularization in an attempt to salvage her foot. She has been on Pletal without any improvement in symptoms. She has not had any real progression of symptoms just lately but has rather severe pain.  PAST MEDICAL HISTORY:  She is a type 2 diabetic. She takes Glucovance. She also has high blood pressure and is on Coreg. She takes Demadex, Zestril 20, and Pletal. She had been on Cipro for the infection in the foot. She is on various pain medications.  ALLERGIES:  No known drug allergies. She is not allergic to latex.  PAST SURGICAL HISTORY:  She has had debridement of the left foot.  SOCIAL HISTORY:  She does not smoke or drink alcoholic beverages.  FAMILY HISTORY AND CHILDHOOD ILLNESSES:  Unremarkable.  REVIEW OF SYSTEMS:  Not remarkable. She has a history of menopause in 24. She denies heart disease and lung disease.  PHYSICAL EXAMINATION:  GENERAL:   The patient is overweight.  VITAL SIGNS:  Unremarkable as recorded by nursing staff.  HEENT/NECK:  Unremarkable.  CHEST:  Clear to auscultation.  HEART:  Rate and rhythm normal. No murmur or gallop noted.  ABDOMEN:  No mass, tenderness, or organomegaly.  BREASTS:  Normal.  EXTREMITIES:  Femoral pulses are hard to palpate because of obesity. No distal pulses noted. There is a necrotic ulcer anterior distal left leg. There is a necrotic ulcer distal left fourth toe. There is no cellulitis or lymphangitis. Sensation is present in the feet, decreased on the plantar surfaces. There is capillary filling present bilaterally.  NEUROLOGICAL:  Unremarkable, except as noted just above.  LYMPH NODES:  None are enlarged.  IMPRESSION: 1. Peripheral vascular disease with severe ischemia of the left foot. 2. Type 2 diabetes. 3. Hypertension.  PLAN:  Left femoral-tibial bypass. The patient accepts the risks of infection, bleeding, need for transfusion, failure of the graft, and a long hospitalization. I will consult her regular physician Lindell Spar. Chestine Spore, M.D. DD:  02/16/01 TD:  02/16/01 Job: [id number]

## 2011-01-25 NOTE — Discharge Summary (Signed)
[place]. [facility]  Patient:    Briana Gould, Briana Gould Visit Number: [id number] MRN: [id number]          Service Type: [facility] Location: [id number] Attending Physician:  Faith Rogue T Dictated by:   Junie Bame, P.A. Admit Date:  03/27/2001 Discharge Date: 04/24/2001   CC:         Lindell Spar. Chestine Spore, M.D.  Dr. Reva Bores.   Discharge Summary  DISCHARGE DIAGNOSES: 1. 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 2. Status post below knee amputation performed on March 19, 2001. 3. Diabetes mellitus. 4. Hypertension. 5. Anemia. 6. Status post pulmonary edema.  HISTORY OF PRESENT ILLNESS:  The patient is a 65 year old black female with a past medical history of PVD, diabetes mellitus type 2, obesity, hypertension, and status post left femoral to tibial bypass with skin graft in [place] 2002, admission on March 09, 2001 for evaluation of bleeding, left foot.  Diagnosis was infected graft.  The patient underwent a ligation of a left femoral tibial graft (but bleeding vessel on March 09, 2001 by Dr. Orson Slick).  Hospital course is significant for acute renal failure, oliguria, anemia, hypotension secondary to blood loss, and MRSA positive wound, and develop a left leg ischemia with gangrenous foot.  On March 19, 2001, the patient underwent a left BKA by Dr. Orson Slick.  Latest PT report indicates that the patient is transfer sit-to-stand +2 with total assist, patient is partially 50%.  With bed to chair transfer with void with max assist.  She is on lovenox for DVT prophylaxis and presently on IV vancomycin.  The patient received packed red blood cells for her anemia.  PAST MEDICAL HISTORY:  Significant for diabetes mellitus, hypertension, PVD.  PAST SURGICAL HISTORY:  Left femoral tibial bypass in [place] 2002, angiogram on Feb 06, 2001, left foot surgery.  MEDICATIONS PRIOR TO ADMISSION:  Coreg, glucovance, Demodex, plavix, Zestril, Cipro, and  Ibuprofen.  SOCIAL HISTORY:  The patient lives in a one-level home with husband, she has one daughter, she was independent prior to admission and has three steps at entry.  No tobacco, no alcohol or smoke.  Has cane and walker.  Occupation is Tree surgeon.  HOSPITAL COURSE:  The patient progressed very slowly in rehabilitation and had had many medical issues during her 28-day stay.  Nevertheless, she was made independently with wheelchair at the time of discharge.  The first three weeks she completed a course of IV vancomycin from a MRSA infection.  On March 10, 2001, the patient developed decreased O2 saturations and shortness of breath. X-ray revealed pulmonary edema and she was started on high doses of Lasix (80 mg p.o. q.12h. and Atrovent and albuterol nebs as needed.  During the first week, the patient also complained of severe constipation and extreme fatigue. The patients hemoglobin on July 22 was 8.1, therefore, she was transfused 2 units of packed red blood cells and she was started on trinsicon and a bowel regimen.  Hospital course during the second and third week is also significant for anorexia, nausea and vomiting, fluctuating CBGs, and increased blood pressure.  The patient was encouraged to eat and she was started on a nutritional supplement.  The patient also began to refuse to take her blood pressure medications, which contributed to the elevated blood pressures. Therefore, the patient was placed on Catapres 0.1 mg q 7 days.  Due to her severe constipation, a KUB was performed on April 03, 2001, which was  within normal limits.  The patient received Reglan p.o. before mealtime and it eventually was discontinued.  She was also started on IV fluids for one day, 7 p.m. to 7 a.m., one-half normal saline for dehydration due to her anorexia. Insulin was adjusted according to her CBGs.  Eventually Dr. Leonides Cave, a neuropsychologist, was confronted to evaluate her and help her  cope with a situation of depression.  The patient eventually started back taking her medicines and eating, and therefore her blood pressures were much more stable and CBGs were controlled.  The forth week of hospital stay was significant vaginal yeast infection and decreased healing around surgical stump.  It was also noted that she was followed by CVTS during her entire stay in rehab.  On April 21, 2001, the patient underwent a left BKA revision by Dr. Kae Heller.  Vancomycin was discontinued on April 13, 2001 for MRSA infection and prior to surgery, she was started on Monistat for vaginal infection.  Her DVT prophylaxis was lovenox, which eventually was discontinued and the patient was placed on heparin.  The patient tolerated surgery procedure well and CVTS states there were promising signs of healing.  LABORATORY DATA:  Latest labs indicated that her hemoglobin was 9.5, hematocrit 27.1, white blood cell count was 13 and platelet count was 309. Potassium was 4.1, sodium was 139, glucose was 117, BUN was 9, creatinine was 1.6, AST was 23, ALT was 25.  Prealbumin on April 10, 2001 was 12.7.  ______ on August 1 and it was 13.6.  At the time of discharge, the patients dressings were changed by CVTS, Dr. Orson Slick.  PHYSICAL EXAMINATION:  Blood pressure was 128/58, respiratory rate was 20, temperature was 98.6.  Right stump demonstrated staples were intact and the surgical incision was healing well.  Lungs were clear.  Abdomen had positive bowel sounds, nontender.  Latest CT report indicated that the patient was modified independently with wheelchair and could perform most ADLs with min assist and close supervision.  Discharged home with her family.  DISCHARGE MEDICATIONS:  1. Diltiazem 180 mg one tab daily.  2. Zestril 20 mg one tab daily.  3. Demodex 20 mg one tab daily.  4. Trinsicon one tab daily.   5. Oxycodone 5 mg 1-2 tablets every 4-6 hours as needed for pain.  6. Catapres 0.3 mg  patch, one patch every 7 days.  7. Lantus insulin 18 units in the a.m., 18 units in the evening, 5 p.m. 10. Glucotrol 10 mg 1 to 1-1/2 tablet in the a.m.  She is to use a wheelchair, use a walker.  DIET:  No concentrated sweets, no salt.  She is to eat all meals, staples removed in one week at Dr. Marcellina Millin office.  DISCHARGE INSTRUCTIONS:  She is to observe hypoglycemic precautions, check CBGs twice a day.  To have home health nurse to do dressing changes and apply collagenase to left thigh twice daily with moist dressing.  She will have advanced home health care for nursing, PT and OT.  She is to follow up with Dr. Faith Rogue on May 29, 2001 at 10:30, follow up with Dr. Orson Slick in 1-2 weeks and follow up with Dr. Margaretmary Bayley within 2-4 weeks. Dictated by:   Junie Bame, P.A. Attending Physician:  Faith Rogue T DD:  05/06/01 TD:  05/06/01 Job: [id number]

## 2011-01-25 NOTE — Discharge Summary (Signed)
NAME:  MIRIYA, CLOER NO.:  [id number]   MEDICAL RECORD NO.:  [id number]          PATIENT TYPE:  INP   LOCATION:  3712                         FACILITY:  [facility]   PHYSICIAN:  Margaretmary Bayley, M.D.    DATE OF BIRTH:  12-24-1945   DATE OF ADMISSION:  01/11/2007  DATE OF DISCHARGE:  01/13/2007                               DISCHARGE SUMMARY   DISCHARGE DIAGNOSES:  1. Marked systemic hypertension.  2. Type 2 diabetes mellitus, poorly controlled.  3. Exogenous obesity.  4. Hyperkalemia.  5. Chronic renal insufficiency.  6. Arteriosclerotic peripheral vascular disease.  7. Prior history of a right below-the-knee amputation.  8. Benign positional vertigo and labyrinthitis.   REASON FOR ADMISSION:  Mrs. Fregeau is a 65 year old poorly-controlled  type 2 diabetic, hypertensive woman with renal insufficiency who was  brought into the emergency room with a chief complaint of dizziness,  nausea but without any other abdominal complaints or abdominal pain.  She was seen in the ER with markedly elevated blood sugar of 274, an  elevation of her potassium 5.6 and subsequently admitted because of her  significant and debilitating dysequilibrium.   PERTINENT PHYSICAL FINDINGS:  GENERAL APPEARANCE:  She is an obese  African American woman who appears quite uncomfortable but without any  acute distress.  VITAL SIGNS:  Admission blood pressure was 214/117, her admission  temperature is 97.6 with a pulse rate 72, respiratory rate of 20 and  unlabored.  HEENT:  There is no nystagmus without movement of the head and the  provocation of vertigo.  NECK:  Neck is supple.  No adenopathy, thyromegaly, no carotid bruits.  CHEST:  Her chest is without any splinting or tenderness and her lungs  are clear.  CARDIAC:  Her precordium is adynamic, S1-S2 are intact and normal.  There is no gallops, no rubs.  ABDOMEN:  Modestly obese with some mild epigastric tenderness but no  rebound, no  masses or organomegaly noted.  EXTREMITIES:  No clubbing or cyanosis.  She does have a prosthesis of  the right leg and is status post below-the-knee amputation on that side.  NEUROLOGIC:  She is alert, oriented, cooperative and without any focal,  sensory, motor or reflex deficit.   PERTINENT LAB AND X-RAY DATA:  The patient's admitting white count is  8400 with hematocrit of 40.  Sed rate 76.  She has a shift to the left.  Her admission pH is 7.36 with a pCO2 of 32 and a bicarb of 18.  Her  chemical profile is with glucose of 274, with a BUN of 45, creatinine of  2.55, potassium of 5.6, chloride of 112, sodium of 136, estimated GFR  19.  Her hemoglobin A1c is 10.4.  A BNP is 287. Cholesterol total of  184, triglycerides 58, HDL 48, LDL 124.  TSH is 1.33 with a free T4  1.27.  Urinalysis:  Urine is clear with a specific gravity of 1017,  pH  6.5, glucose 500, she has a small amount of hemoglobin, negative  bilirubin, ketones, protein, however, is greater than 300 mg percent,  negative nitrites and leukocyte esterase.   The  patient was admitted with significantly uncontrolled systemic  hypertension and type 2 diabetes mellitus.  She was started on IV  labetalol to acutely treat her hypertensive state.  Her potassium  elevation was felt to be related to some component of interstitial  nephropathy in addition to her diabetic and hypertensive related renal  disease.  Most of her nausea and vomiting in the absence of any  abdominal complaints and with her duplication of her symptoms with  sudden movements and her head were felt to be consistent with vertigo.  A CT brain scan was done and revealed no acute process.  She did have  some mild atrophy.  Chest x-ray, likewise, revealed no acute disease.  She was treated symptomatically for nausea with Zofran.  She was started  on maintenance labetalol after being given several doses IV p.r.n. over  the next 48 hours with the IV and p.o. labetalol  added to her  hypertensive regimen which included Exforge or Norvasc and maximum dose  Diovan, her blood pressures came down to 158/90.  She was given one dose  of Kayexalate at 30 grams p.o. to treat her hyperkalemia.  Her diabetes  was managed with a fractional schedule of NovoLog based on her  preprandial blood sugars.  In place of the labetalol, she was switched  to Coreg at 25 mg b.i.d. for blood pressure control.  Serial enzymes and  EKGs did not reveal any evidence of an acute myocardial event.  Patient  is being followed by Dr. Lowell Guitar of [facility] and she is  instructed to continue her follow-up visits.  She was not felt to be  uremic here in the hospital although her creatinine level was slightly  higher than the usual average that she runs of around 2 to 2.3.   DISCHARGE MEDICATIONS:  1. She was instructed to continue her Statins in the form of Crestor      10 mg per day.  2. Plavix 75 mg per day.  3. Diovan 320 mg per day.  4. Norvasc 10 mg daily.  5. She was also being treated with Rocaltrol 0.25 mg by Dr. Lowell Guitar for      her renal insufficiency related hyperparathyroidism. 6.  Imdur 30      mg per day.  6. Amaryl at 4 mg twice a day.  7. The patient was discharged home on diazepam 2 mg every 8 hours      p.r.n. dizziness or vertigo.   She had been asymptomatic for the last 24-36 hours of her  hospitalization and it was not felt at this time that she needed rehab  as an outpatient.  She is scheduled to be seen back in the office in two  weeks.  She is discharged on a 3 grams sodium, 1500 calorie carb  modified fat restricted diet.   DISCHARGE CONDITION:  Much improved.           ______________________________  Margaretmary Bayley, M.D.     PC/MEDQ  D:  03/07/2007  T:  03/08/2007  Job:  [id number]

## 2011-01-25 NOTE — Op Note (Signed)
[place]. [facility]  Patient:    Briana Gould, Briana Gould                        MRN: [id number] Proc. Date: 02/16/01 Adm. Date:  [id number] Attending:  Meredith Leeds CC:         Lindell Spar. Chestine Spore, M.D.  Runell Gess, M.D.   Operative Report  PREOPERATIVE DIAGNOSIS:  Arteriosclerotic peripheral vascular disease with severe ischemia of the left foot.  POSTOPERATIVE DIAGNOSIS:  Arteriosclerotic peripheral vascular disease with severe ischemia of the left foot.  OPERATION:  Left femoral to dorsalis pedis bypass with in situ long saphenous vein.  SURGEON:  Zigmund Daniel, M.D.  ASSISTANT:  Milus Mallick, M.D.  ANESTHESIA:  General.  DESCRIPTION OF PROCEDURE:  After the patient was adequately monitored and anesthetized, had a Foley catheter inserted, and routine preparation and draping of the left lower abdomen and entire left lower extremity.  I first explored the long saphenous vein, since my choice of operation depended upon the availability of a good vein.  I began at the ankle and found that throughout its entire length.  The long saphenous vein was of adequate caliber, although there was one somewhat narrow segment in the thigh.  I did feel that that was preferable to performing a shorter graft, however, and so I committed to do a femoral to distal anterior tibial or dorsalis pedis bypass. I dissected out the femoral vessels and controlled the superficial femoral just distal to the profunda, as it was a nice soft vessel there, and the bifurcation was high, and the patient was very obese.  The saphenous vein was a nice caliber at that location.  I then dissected the dorsum of the foot, and found the neurovascular bundle, and dissected out and controlled the anterior tibial artery.  I found it was highly calcified.  I followed it down into the foot and found a segment which was relatively softer, and decided I would tie in at that point,  as I had adequate length of vein.  After the patient received 5000 units of heparin and had had adequate time to circulate, I tied off the long saphenous vein at the saphenofemoral junction, and dissected up the proximal part of it and tied off side branches, and then cut the first two valves with valve scissors.  I made a longitudinal arteriotomy on the superficial femoral artery after placing clamps proximally and distally.  The vessel was relatively healthy.  I put in an end-to-side anastomosis of vein to artery with #6-0 Prolene suture, and allowed flow through, and got flow about one-third of the way down the vein graft.  Hemostasis was good.  I then entered the vein graft with the valvulotome at a couple of locations through side branches, cut valves, and got flow down to the distal end after I cut the last few valves.  I divided the saphenous vein distally after clipping it distally.  The vein reached nicely.  I made a tunnel between two incisions, and brought it through, taking care not to twist it.  I did note that there was some diminution of the pulse distally, and that improved nicely after I located and clipped one tributary of the saphenous vein, which was producing an AV fistula.  I performed the distal anastomosis using #6-0 Prolene suture, after cutting the vein to length and spatulating it, and taking it through the tunnel very carefully.  There  was excellent flow through the vein graft with a nice pulse when it was occluded, and excellent flow characteristics with the Doppler.  The distal vessel calibrated to 2.0 mm and both ends of the vein graft calibrated to 4.0 mm easily, after the anastomoses were almost complete. Hemostasis was good.  I closed the areas of fat with #2-0 Vicryl where it would accommodate, and closed the skin with staples throughout.  The patient went to the recovery room in stable condition, after bandages were applied. DD:  02/16/01 TD:   02/16/01 Job: [id number]

## 2011-01-25 NOTE — Discharge Summary (Signed)
[place]. [facility]  Patient:    ARLEN, LEGENDRE                        MRN: [id number] Adm. Date:  [id number] Disc. Date: [id number] Attending:  Wende Mott                           Discharge Summary  ADMISSION DIAGNOSIS:  Cellulitis, abscess, osteomyelitis, left foot.  DISCHARGE DIAGNOSIS:  Cellulitis, abscess, osteomyelitis, left foot, with methicillin-resistant Staphylococcus aureus infection.  PERTINENT HISTORY:  This is a 65 year old female, known diabetic, treated as an outpatient with cellulitis of her foot and had been treated with Cipro. Patient had a bone scan which subsequently revealed osteomyelitis involving the second metatarsal and the proximal phalanx.  Patient developed sudden onset of pain, swelling and fever involving the left foot and was seen at [facility] Emergency Room and found to have cellulitis and abscess with drainage, plantar aspect.  PHYSICAL EXAMINATION:  Pertinent physical was to that of the left foot, tenderness, swollen, with increased warmth and discoloration, dorsal aspect of the left foot.  HOSPITAL COURSE:  Patients blood tests were found to be stable enough for patient to undergo surgery.  Patient underwent incision, drainage, irrigation and debridement, metatarsectomy and phalangectomy and left the wound open. Patients cultures did eventually grow out MRSA.  Infectious disease saw the patient as well as Dr. Lindell Spar. Chestine Spore, who is her primary care doctor, and patient was placed on vancomycin IV.  Patient was taken back to the operating room for further debridement and wound closure and tolerated the procedure quite well.  Packing was eventually removed.  Patient was continued on IV antibiotics and outpatient home therapy was arranged for outpatient IV antibiotics.  Patient was eventually able to be discharged and was discharged on IV vancomycin with home health nurse on an outpatient basis.  FOLLOWUP:   Return to the office on Wednesday for dressing change.  DISCHARGE MEDICATIONS:  Patient is continued on Ultracet two q.4h. p.r.n. for pain and was fitted with a Darco boot.  CONDITION ON DISCHARGE:  Patient is discharged in stable and satisfactory condition. DD:  09/25/00 TD:  09/25/00 Job: [id number]

## 2011-01-25 NOTE — Discharge Summary (Signed)
[place]  Patient:    Briana Gould, Briana Gould. Visit Number: [id number] MRN: [id number]          Service Type: Attending:  Zigmund Daniel, M.D. Dictated by:   Zigmund Daniel, M.D. Adm. Date:  03/09/01 Disc. Date: 03/27/01   CC:         Lindell Spar. Chestine Spore, M.D.   Discharge Summary  HISTORY OF PRESENT ILLNESS:  The patient is a 65 year old black female who has peripheral vascular disease and recently underwent left femoral to tibial bypass graft.  She had developed wound infection, but was generally felt to be doing well with that on an outpatient basis.  She also has hypertension and type 2 diabetes.  She started having drastic bleeding from the lower incision on the morning of admission, and was brought to the emergency department.  She was in shock, but responded to resuscitation.  I saw the patient emergently and took her to the operating room right away.  See the history and physical for further details.  HOSPITAL COURSE:  The patient was taken to the operating room and urgently operated on.  I ligated her graft which was bleeding in the skin bridge between the graft and between the two incisions distally.  There actually was not any overt exposure of the graft distally, but obviously it had been involved by serious infection.  Following that, the patient had oliguria, and was thought to possibly have acute renal failure.  I consulted nephrology, and they followed her.  Her renal function improved.  Dr. Chestine Spore followed the patient as well.  I felt that the patient had very severe ischemia of the left foot, and I recommended that she undergo below-the-knee amputation.  I did not feel that attempts at limb salvage were too likely to be successful.  The patient was somewhat reluctant to accept this, and requested another opinion. Dr. Desmond Dike saw her for plastic surgery, and felt that efforts at saving the foot were likely to be unsuccessful,  and he did not think that any local flaps or free flaps would be successful.  Dr. Tawanna Cooler Early also saw her, and agreed that below-the-knee amputation was the best course of action.  The patients general condition improved, and her wound infection which was found to be due to methicillin-resistant Staphylococcus aureus improved with antibiotic treatment.  She had quite a bit of pain.  She refused an amputation until 03/19/01, when she agreed to have it.  She underwent the procedure on that date.  The tissues at the below knee level seemed viable, and likely to heal with no infection evidently present.  A large open thigh wound was treated by vacuum assisted closure device, and responded nicely.  There was some breakdown of portions of the BK stump with slight skin separation, but no purulence.  The patient was felt to be stable for transfer to rehabilitation unit, and was seen and accepted by Dr. Riley Kill.  She was transferred to the rehabilitation unit for further care on 03/27/01, and I will plan to continue to follow the patient on that unit.  DIAGNOSES: 1. Peripheral vascular disease with recent reconstructive surgery of the lower    extremity. 2. Wound infection due to methicillin-resistant Staphylococcus aureus. 3. Diabetes mellitus, type 2. 4. Hypertension. 5. Blood loss anemia. 6. Acute renal failure, improved.  OPERATION: 1. Ligation of bypass graft. 2. Below-the-knee amputation of the left leg.  CONDITION ON DISCHARGE:  Improved. Dictated by:   Chrissie Noa  Lina Sar, M.D. Attending:  Zigmund Daniel, M.D. DD:  05/13/01 TD:  05/13/01 Job: [id number]

## 2011-01-25 NOTE — H&P (Signed)
[place]. [facility]  Patient:    Briana Gould, Briana Gould                        MRN: [id number] Adm. Date:  [id number] Attending:  Meredith Leeds                         History and Physical  CHIEF COMPLAINT:  Bleeding from foot.  HISTORY OF PRESENT ILLNESS:  Patient is a 65 year old black female type 2 diabetic with peripheral vascular disease who is about two weeks status post left femoral to tibial bypass with in situ saphenous vein.  The vascular portion has done well and the ischemic toe and ulcer on her anterior leg that were the reasons for her operation were appearing to heal.  Patient did have wound breakdown and was undergoing dressing changes at home.  There had been no bleeding until the morning of admission.  There was no obvious infection although patient was on oral antibiotic coverage with Cipro.  She had drastic bleeding early in the morning of admission.  She was brought to the emergency department by EMS and immediately taken to surgery.  She is admitted to the hospital for convalescence and medical care following ligation of her bypass graft and stabilization.  PAST MEDICAL HISTORY:  She is a type 2 diabetic.  She is on Glucovance 2.5/500 b.i.d.  Patient has hypertension, is on Coreg 6.25 mg q.d.  She also is on Demodex 20 mg q.d. and Zestril 20 mg q.d.  She had some difficulty with control of her high blood pressure during her hospitalization for her vascular surgery.  The patient also was taking Pletal 100 mg b.i.d.  She also took a multivitamin and Restoril.  She had been on oral pain medicine.  She required a couple of blood transfusions following her vascular surgery and had a hematoma of the thigh.  That had also been opened and was getting dressing changes.  Patient does not smoke.  She does not drink.  She denies heart disease and lung disease and other serious chronic ailments.  There was no history of any renal disease but she  was oliguric following the emergency procedure.  See the previous records for greater details.  REVIEW OF SYSTEMS:  Unremarkable beyond the above.  ALLERGIES:  None noted.  PHYSICAL EXAMINATION  GENERAL:  The patient is an obese black female.  She was alert at the postoperative time.  VITAL SIGNS:  As recorded by nursing staff.  HEENT:  Unremarkable.  CHEST:  Clear to auscultation.  HEART:  Rate was a little bit rapid, rhythm regular.  Very soft systolic murmur noted.  ABDOMEN:  No mass, tenderness, or organomegaly.  EXTREMITIES:  Warm right foot with good dorsalis pedis pulse.  Left side postoperative condition, dressings in place.  Foot was cool, but sensitive.  RECTAL:  Not performed.  SKIN:  No lesions noted except on the lower extremity on the left.  NEUROLOGIC:  Basically normal.  LYMPH NODES:  None enlarged.  IMPRESSION: 1. Peripheral vascular disease with severe ischemia of the left foot. 2. Hemorrhage from bypass graft due to infection. 3. Oliguria following a period of hypotension. 4. Diabetes mellitus type 2. 5. Hypertension. 6. Open wounds of the lower extremity.  PLAN:  Admission for supportive care and medical consultation. DD:  03/16/01 TD:  03/16/01 Job: [id number]

## 2011-01-25 NOTE — H&P (Signed)
[place]. [facility]  Patient:    Briana Gould, Briana Gould                        MRN: [id number] Adm. Date:  [id number] Disc. Date: [id number] Attending:  Wende Mott                         History and Physical  CHIEF COMPLAINT:  Painful swollen left foot.  HISTORY OF PRESENT ILLNESS:  This is a 65 year old female diabetic whom I have been treating in the office for a left diabetic foot with cellulitis.  Patient had done fairly well.  Bone scan had been done to rule out the possibility of osteomyelitis; bone scan did reveal presence of osteomyelitis involving the second metatarsal as well as proximal phalanx.  Patient developed severe onset of pain, swelling and redness in a 24-hour period with fever and night chills. Patient was brought to [facility] Emergency Room, evaluated and admitted for cellulitis, abscess and osteomyelitis of her left foot.  PAST MEDICAL HISTORY:  No previous operations.  Diabetes mellitus.  HABITS:  None.  ALLERGIES:  None.  MEDICATIONS:  Norvasc, Glucovance, Cipro, Zestoretic, multivitamin.  REVIEW OF SYSTEMS:  Basically that of history of present illness.  No cardiac, respiratory, urinary or bowel symptoms.  FAMILY HISTORY:  Diabetes mellitus.  PHYSICAL EXAMINATION  VITAL SIGNS:  Temperature 99.8, pulse 87, respirations 21, blood pressure 205/79.  HEENT:  Normocephalic.  Eyes:  Conjunctivae and sclerae clear.  NECK:  Supple.  CHEST:  Clear.  CARDIAC:  S1 and S2 regular.  EXTREMITIES:  Left foot with increased warmth and discoloration.  It is markedly swollen dorsally, markedly tender.  IMPRESSION:  Diabetic left foot with abscess and cellulitis, with osteomyelitis of the proximal phalanx and second metatarsal. DD:  09/25/00 TD:  09/25/00 Job: [id number]

## 2011-01-25 NOTE — Op Note (Signed)
[place]. [facility]  Patient:    Briana Gould, Briana Gould Visit Number: [id number] MRN: [id number]          Service Type: MED Location: MICU 2106 01 Attending Physician:  Meredith Leeds Proc. Date: 03/09/01 Admit Date:  03/09/2001                             Operative Report  PREOPERATIVE DIAGNOSIS:  Postoperative bleeding.  POSTOPERATIVE DIAGNOSIS:  Bleeding from distal portion of femoral to tibial bypass graft due to infection and breakdown of vein graft.  PROCEDURE:  Ligation and partial excision of vein graft with debridement of tissue and drainage of infection.  SURGEON:  Zigmund Daniel, M.D.  ANESTHESIA:  General.  CLINICAL NOTE:  Patient is about two weeks status post femoral to tibial bypass graft for severely ischemic left foot.  The patient had developed a wound infection postoperatively but was improving and felt to be safe at home. She was being treated by dressing changes and antibiotics.  On the morning of surgery, the patient was found by her husband to have severe bleeding from the area of the dorsum of the foot.  She was brought to the emergency department by EMS with a lot of bleeding noted and was emergently brought to the emergency room after provision of O negative blood and fluids.  DESCRIPTION OF PROCEDURE:  After the patient was adequately monitored and had adequate intravenous access, routine preparation and draping of the left lower extremity, I explored the area of bleeding from the distal incision in the dorsum of the foot.  I found that there was no active bleeding at the time of exploration.  I debrided some tissue, exposing the vein graft as it came down. I found the area of the anastomosis was intact, but there was some blood clot present on the anterior surface of the graft just as it emerged from the tunnel between the long incision and the foot incision.  While examining that area, active bleeding occurred.   There was obvious infection demonstrated by odor and by pus present.  I took a culture.  I clamped the graft above the broken-down area and explored it further and found that it was involved quite a bit with the infection process, and I did not feel it could be safely repaired or otherwise salvaged.  I ligated it proximally with 2-0 Vicryl and then dissected it down distally.  There was just a little bit of backbleeding through the graft from the anterior tibial artery.  Rather than take down the anastomosis, I left it intact, tied it off very close to that area with 2-0 Vicryl, and excised the portion of the graft in between.  I still noted at that point just a little bit of bleeding in the tunnel, and I felt that perhaps there was further problem with the vein graft in that area, so I extracted it from the tunnel and removed all of the portion of the graft that had been in the tunnel between the two incisions and ligated it more proximally with 2-0 Vicryl, and then I felt secure that the bleeding was controlled.  I explored the remainder of the incision, found just a little bit of hematoma and infected material in the thigh incision and cleared that out and packed it.  No other areas of vein appeared to be in danger of immediate rupture.  After thoroughly cleansing all incisions, I  packed them with moist gauze and applied a bulky bandage.  The patient went to PACU in stable condition. Attending Physician:  Meredith Leeds DD:  03/09/01 TD:  03/10/01 Job: [id number]

## 2011-01-25 NOTE — Op Note (Signed)
[place]. [facility]  Patient:    Briana Gould, Briana Gould                        MRN: [id number] Proc. Date: 09/03/00 Adm. Date:  [id number] Disc. Date: [id number] Attending:  Wende Mott                           Operative Report  PREOPERATIVE DIAGNOSIS:  Open wound abscess, left foot.  POSTOPERATIVE DIAGNOSIS:  Open wound abscess, left foot.  ANESTHESIA:  General.  PROCEDURE:  Irrigation, debridement and delayed wound closure, left foot.  SURGEON:  Kennieth Rad, M.D.  DESCRIPTION OF PROCEDURE:  Patient was taken to the operating room after giving adequate preoperative medications and given general anesthesia.  Left foot was scrubbed with Betadine scrub and painted with Betadine solution and draped in a sterile manner.  A waterpick was used to further debride the skin edges and into the wound.  The wound itself looked fairly healthy; good bleeding sources after adequate debridement.  The wound was packed again with Iodoform one-half inch, with the packing extending out to the plantar callosity opening.  Wound closure was then done with 0 nylon with retention sutures, then a bulky compressive dressing was applied.  Patient tolerated the procedure quite well and went to the recovery room. DD:  09/25/00 TD:  09/25/00 Job: [id number]

## 2011-01-25 NOTE — Op Note (Signed)
[employer]. [facility]  Patient:    Briana Gould, Briana Gould                        MRN: [id number] Proc. Date: 04/17/01 Adm. Date:  [id number] Attending:  Faith Rogue T                           Operative Report  PREOPERATIVE DIAGNOSIS:  Broken down left above-knee stump.  POSTOPERATIVE DIAGNOSIS:  Broken down left above-knee stump.  OPERATION:  Re-amputation of left above-knee stump.  SURGEON:  Zigmund Daniel, M.D.  ANESTHESIA:  General.  DESCRIPTION OF PROCEDURE:  After the patient was adequately monitored and anesthetized and had routine preparation and draping of the left amputated stump, I removed thE staples which remained.  I opened the wound entirely.  On the medial aspect, it was granulated together nicely, and I did not try to open it down to the depths other than to probe it to make sure that there was no infection present there.  I did not find any.  I removed a little bit of necrotic skin laterally.  I freshened the skin throughout the length of the wound and removed a little bit of thinned out unhealthy-appearing skin just over the tibia.  There was quite a bit of devitalized fascia present, and I removed that.  Bleeding was present at most of the cut surfaces.  It looked as if it was adequate for healing.  No pus was encountered.  I raised periosteum from the tibia for approximately 2 cm and re-amputated it with the Gigli saw angling the cut cephalad in the anterior portion.  Bone appeared healthy at that point.  That left very little tibia exposed beyond the end of it.  The fibula was not encountered.  I then shaped the bone a bit with large rongeur and rasp.  I could approximate the skin edges with minimal tension.  I freed up the skin a little bit to provide a little more freedom of motion.  I then copiously irrigated the area and got very good hemostasis.  I put a Jackson-Pratt drain in the most dependent part of the incision bringing it  out medially and securing it to the skin with a 2-0 Vicryl stitch.  I made sure that all the holes were buried deeply.  I closed the tissues over the tibia with running 2-0 Vicryl stitch.  I closed the skin with staples rather loosely.  The Jackson-Pratt seemed to be holding its vacuum pretty well.  I applied a bulky compressive bandage.  The patient tolerated the operation well. DD:  04/17/01 TD:  04/19/01 Job: [id number]

## 2011-01-25 NOTE — Discharge Summary (Signed)
[place]. [facility]  Patient:    Briana Gould, Briana Gould                       MRN: [id number] Adm. Date:  02/23/01 Attending:  Zigmund Daniel, M.D. CC:         Lindell Spar. Chestine Spore, M.D.  Runell Gess, M.D.   Discharge Summary  HISTORY OF PRESENT ILLNESS:  The patient is a 65 year old black female with peripheral vascular disease and diabetes, who had severe pain and nonhealing ulceration of the left leg and left foot.  She had undergone debridement for osteomyelitis with evident cure of that, but continued to have pain.  Runell Gess, M.D., attempted angioplasty of her superficial femoral artery, but was unable to cross the blockage and was unable to improve her circulation. She was admitted to the hospital for femorotibial to tibial bypass.  She is a type 2 diabetic.  She is on GlucoVance.  She also has high blood pressure and is on Coreg.  She is also on Demadex, Zestril, and Pletal.  She had been on Cipro for an infection of the foot.  See the history and physical for further details.  PHYSICAL EXAMINATION:  Somewhat overweight.  Mild edema of the lower extremities, left worse than right.  Femoral pulses are difficult to palpate due to obesity.  There were no distal pulses.  There was a necrotic ulcer on the anterior distal left leg.  There was a necrotic ulcer of the distal left fourth toe.  There was no cellulitis or lymphangitis.  There was decreased sensation of the plantar surfaces of the feet.  HOSPITAL COURSE:  On the time of admission, she underwent left femoral to dorsalis pedis bypass with in situ valvulotomized long saphenous vein.  The vein was of good quality and the distal circulation was immediately improved with a nice warm foot and a good pulse in the distal graft and dorsalis pedis artery.  The patient had moderate bleeding into the left thigh postoperatively and required transfusion of blood.  She had a low-grade fever, but had  no other evidence of infection.  The blood pressure was somewhat difficult to control, but did come under reasonably good control.  By the seventh postoperative day, the patient was ambulating well, was afebrile, and the blood pressure was adequately controlled.  She was discharged to home to be seen in the office in four to five days for removal of her staples.  Excellent pulse is present in the graft.  There was no worsening of the ulcerations, but improvement noted slightly.  DIAGNOSES: 1. Peripheral vascular disease with severe ischemia of the left foot. 2. Type 2 diabetes. 3. Hypertension.  OPERATION:  Left femoral to dorsalis pedis bypass.  DISCHARGE CONDITION:  Improved. DD:  03/06/01 TD:  03/06/01 Job: [id number]

## 2011-02-18 ENCOUNTER — Encounter: Payer: Self-pay | Admitting: Cardiology

## 2011-04-04 ENCOUNTER — Observation Stay (HOSPITAL_COMMUNITY)
Admission: EM | Admit: 2011-04-04 | Discharge: 2011-04-07 | Disposition: A | Discharge status: Home / Self Care | Payer: BC Managed Care – PPO | Attending: Internal Medicine | Admitting: Internal Medicine

## 2011-04-04 DIAGNOSIS — N039 Chronic nephritic syndrome with unspecified morphologic changes: Secondary | ICD-10-CM | POA: Insufficient documentation

## 2011-04-04 DIAGNOSIS — Z7902 Long term (current) use of antithrombotics/antiplatelets: Secondary | ICD-10-CM | POA: Insufficient documentation

## 2011-04-04 DIAGNOSIS — N186 End stage renal disease: Secondary | ICD-10-CM | POA: Insufficient documentation

## 2011-04-04 DIAGNOSIS — I739 Peripheral vascular disease, unspecified: Secondary | ICD-10-CM | POA: Insufficient documentation

## 2011-04-04 DIAGNOSIS — D631 Anemia in chronic kidney disease: Secondary | ICD-10-CM | POA: Insufficient documentation

## 2011-04-04 DIAGNOSIS — I120 Hypertensive chronic kidney disease with stage 5 chronic kidney disease or end stage renal disease: Secondary | ICD-10-CM | POA: Insufficient documentation

## 2011-04-04 DIAGNOSIS — E785 Hyperlipidemia, unspecified: Secondary | ICD-10-CM | POA: Insufficient documentation

## 2011-04-04 DIAGNOSIS — Z7982 Long term (current) use of aspirin: Secondary | ICD-10-CM | POA: Insufficient documentation

## 2011-04-04 DIAGNOSIS — Z792 Long term (current) use of antibiotics: Secondary | ICD-10-CM | POA: Insufficient documentation

## 2011-04-04 DIAGNOSIS — S88119A Complete traumatic amputation at level between knee and ankle, unspecified lower leg, initial encounter: Secondary | ICD-10-CM | POA: Insufficient documentation

## 2011-04-04 DIAGNOSIS — Z992 Dependence on renal dialysis: Secondary | ICD-10-CM | POA: Insufficient documentation

## 2011-04-04 DIAGNOSIS — Z79899 Other long term (current) drug therapy: Secondary | ICD-10-CM | POA: Insufficient documentation

## 2011-04-04 DIAGNOSIS — R5381 Other malaise: Principal | ICD-10-CM | POA: Insufficient documentation

## 2011-04-04 DIAGNOSIS — E119 Type 2 diabetes mellitus without complications: Secondary | ICD-10-CM | POA: Insufficient documentation

## 2011-04-04 LAB — BASIC METABOLIC PANEL
BUN: 41 mg/dL — ABNORMAL HIGH (ref 6–23)
CO2: 29 mEq/L (ref 19–32)
Chloride: 93 mEq/L — ABNORMAL LOW (ref 96–112)
Creatinine, Ser: 6.73 mg/dL — ABNORMAL HIGH (ref 0.50–1.10)
Glucose, Bld: 130 mg/dL — ABNORMAL HIGH (ref 70–99)
Potassium: 4.4 mEq/L (ref 3.5–5.1)

## 2011-04-04 LAB — CBC
HCT: 24.2 % — ABNORMAL LOW (ref 36.0–46.0)
Hemoglobin: 8 g/dL — ABNORMAL LOW (ref 12.0–15.0)
MCV: 93.1 fL (ref 78.0–100.0)
RBC: 2.6 MIL/uL — ABNORMAL LOW (ref 3.87–5.11)
WBC: 7.3 10*3/uL (ref 4.0–10.5)

## 2011-04-05 ENCOUNTER — Observation Stay (HOSPITAL_COMMUNITY): Payer: BC Managed Care – PPO

## 2011-04-05 LAB — CBC
HCT: 22.4 % — ABNORMAL LOW (ref 36.0–46.0)
MCH: 31.3 pg (ref 26.0–34.0)
MCHC: 33.5 g/dL (ref 30.0–36.0)
MCV: 93.3 fL (ref 78.0–100.0)
Platelets: 206 10*3/uL (ref 150–400)
RDW: 16.2 % — ABNORMAL HIGH (ref 11.5–15.5)
WBC: 5.6 10*3/uL (ref 4.0–10.5)

## 2011-04-05 LAB — RENAL FUNCTION PANEL
Albumin: 3.3 g/dL — ABNORMAL LOW (ref 3.5–5.2)
BUN: 45 mg/dL — ABNORMAL HIGH (ref 6–23)
Calcium: 10 mg/dL (ref 8.4–10.5)
Chloride: 94 mEq/L — ABNORMAL LOW (ref 96–112)
Creatinine, Ser: 7.57 mg/dL — ABNORMAL HIGH (ref 0.50–1.10)
GFR calc non Af Amer: 5 mL/min — ABNORMAL LOW (ref >60)
Phosphorus: 3.9 mg/dL (ref 2.3–4.6)

## 2011-04-05 LAB — GLUCOSE, CAPILLARY
Glucose-Capillary: 126 mg/dL — ABNORMAL HIGH (ref 70–99)
Glucose-Capillary: 133 mg/dL — ABNORMAL HIGH (ref 70–99)

## 2011-04-05 LAB — URINALYSIS, ROUTINE W REFLEX MICROSCOPIC
Bilirubin Urine: NEGATIVE
Glucose, UA: NEGATIVE mg/dL
Ketones, ur: NEGATIVE mg/dL
Specific Gravity, Urine: 1.011 (ref 1.005–1.030)
pH: 8 (ref 5.0–8.0)

## 2011-04-05 LAB — URINE MICROSCOPIC-ADD ON

## 2011-04-05 LAB — MAGNESIUM: Magnesium: 2.1 mg/dL (ref 1.5–2.5)

## 2011-04-05 LAB — MRSA PCR SCREENING: MRSA by PCR: NEGATIVE

## 2011-04-06 LAB — GLUCOSE, CAPILLARY
Glucose-Capillary: 87 mg/dL (ref 70–99)
Glucose-Capillary: 187 mg/dL — ABNORMAL HIGH (ref 70–99)
Glucose-Capillary: 173 mg/dL — ABNORMAL HIGH (ref 70–99)
Glucose-Capillary: 185 mg/dL — ABNORMAL HIGH (ref 70–99)

## 2011-04-06 LAB — MAGNESIUM: Magnesium: 2.1 mg/dL (ref 1.5–2.5)

## 2011-04-06 LAB — COMPREHENSIVE METABOLIC PANEL
Albumin: 3.5 g/dL (ref 3.5–5.2)
Alkaline Phosphatase: 69 U/L (ref 39–117)
BUN: 23 mg/dL (ref 6–23)
Creatinine, Ser: 4.46 mg/dL — ABNORMAL HIGH (ref 0.50–1.10)
GFR calc Af Amer: 12 mL/min — ABNORMAL LOW (ref >60)
Glucose, Bld: 161 mg/dL — ABNORMAL HIGH (ref 70–99)
Potassium: 3.8 mEq/L (ref 3.5–5.1)
Total Bilirubin: 0.3 mg/dL (ref 0.3–1.2)
Total Protein: 6.7 g/dL (ref 6.0–8.3)

## 2011-04-06 LAB — DIFFERENTIAL
Basophils Absolute: 0 10*3/uL (ref 0.0–0.1)
Basophils Relative: 1 % (ref 0–1)
Eosinophils Absolute: 0.2 10*3/uL (ref 0.0–0.7)
Eosinophils Relative: 3 % (ref 0–5)
Monocytes Absolute: 0.3 10*3/uL (ref 0.1–1.0)
Monocytes Relative: 5 % (ref 3–12)

## 2011-04-06 LAB — CBC
Hemoglobin: 7.8 g/dL — ABNORMAL LOW (ref 12.0–15.0)
MCH: 31.5 pg (ref 26.0–34.0)
MCHC: 33.3 g/dL (ref 30.0–36.0)
RDW: 16.4 % — ABNORMAL HIGH (ref 11.5–15.5)

## 2011-04-07 LAB — COMPREHENSIVE METABOLIC PANEL
ALT: 17 U/L (ref 0–35)
AST: 15 U/L (ref 0–37)
Alkaline Phosphatase: 78 U/L (ref 39–117)
CO2: 29 mEq/L (ref 19–32)
Calcium: 9.9 mg/dL (ref 8.4–10.5)
Chloride: 95 mEq/L — ABNORMAL LOW (ref 96–112)
GFR calc non Af Amer: 7 mL/min — ABNORMAL LOW (ref >60)
Potassium: 4 mEq/L (ref 3.5–5.1)
Sodium: 134 mEq/L — ABNORMAL LOW (ref 135–145)
Total Bilirubin: 0.4 mg/dL (ref 0.3–1.2)

## 2011-04-07 LAB — CBC
Hemoglobin: 8.5 g/dL — ABNORMAL LOW (ref 12.0–15.0)
Platelets: 207 10*3/uL (ref 150–400)
RBC: 2.77 MIL/uL — ABNORMAL LOW (ref 3.87–5.11)
WBC: 7.4 10*3/uL (ref 4.0–10.5)

## 2011-04-07 LAB — DIFFERENTIAL
Basophils Absolute: 0 10*3/uL (ref 0.0–0.1)
Basophils Relative: 0 % (ref 0–1)
Eosinophils Absolute: 0.2 10*3/uL (ref 0.0–0.7)
Neutro Abs: 4.7 10*3/uL (ref 1.7–7.7)
Neutrophils Relative %: 63 % (ref 43–77)

## 2011-04-07 LAB — CROSSMATCH: ABO/RH(D): O POS

## 2011-04-07 LAB — GLUCOSE, CAPILLARY: Glucose-Capillary: 212 mg/dL — ABNORMAL HIGH (ref 70–99)

## 2011-04-07 NOTE — Discharge Summary (Signed)
NAME:  Briana Gould, Briana Gould NO.:  [id number]  MEDICAL RECORD NO.:  [id number]  LOCATION:  6710                         FACILITY:  [facility]  PHYSICIAN:  Talmage Nap, MD  DATE OF BIRTH:  25-Jun-1946  DATE OF ADMISSION:  04/04/2011 DATE OF DISCHARGE:  04/07/2011                        DISCHARGE SUMMARY - REFERRING   PRIMARY CARE PHYSICIAN:  Margaretmary Bayley, MD  PRIMARY NEPHROLOGIST:  Mindi Slicker. Lowell Guitar, MD  DISCHARGE DIAGNOSES: 1. Weakness/lethargy most likely secondary to anemia. 2. Acute anemia, superimposed on anemia of chronic disorder, status     post packed RBC transfusion. 3. End-stage renal disease, on dialysis. 4. Diabetes mellitus. 5. Hypertension. 6. Hyperlipidemia. 7. Peripheral artery disease, status post left BKA.  HISTORY OF PRESENT ILLNESS:  The patient is a 65 year old African American female with history of end-stage renal disease, on dialysis and peripheral vascular disease, status post left BKA was admitted to the hospital on April 04, 2011 with complaints of weakness and lethargy. Bowel or vomiting.  The patient was said to have recently come back from vacation in [place] and during vacation, she claims she got her dialysis, but did not get Epogen during dialysis.  When the patient got back from the vacation, she was very lethargic and very weak.  She denied any chest pain.  She denied any shortness of breath.  She denied any shortness of breath.  She denied any systemic symptoms.  No fever, no chills, no rigor and subsequently presented to the hospital to be evaluated.  PREADMISSION MEDS: 1. Labetalol 200 mg p.o. b.i.d. 2. Tylenol 500 mg 1 to 2 tablets p.o. q. 6 p.r.n. 3. Aspirin 325 mg p.o. daily. 4. Amlodipine 10 mg p.o. daily. 5. Nephro-Vite 1 p.o. daily. 6. Simvastatin 20 mg p.o. at bedtime. 7. Doxazosin 8 mg at bedtime. 8. Doxycycline 100 mg p.o. daily. 9. Amaryl 4 mg p.o. daily. 10.Hydralazine 25 mg one to one and half tablet  p.o. three times a     day.  ALLERGIES:  OXYCODONE/APAP, PERCOCET/TYLENOL/VERSED.  PAST SURGICAL HISTORY: 1. Status post left BKA,following unsuccessful femoral-popliteal bypass. 2. Left AV graft.  SOCIAL HISTORY:  Negative for alcohol or tobacco use and the patient is a retired Librarian, academic for [employer] and she lives at home with her spouse.  FAMILY HISTORY:  York Spaniel to be noncontributory.  REVIEW OF SYSTEMS:  Essentially as documented in the initial history and physical.  At that time, the patient was seen by the admitting physician.  PHYSICAL EXAMINATION:  VITAL SIGNS:  Blood pressure is 114/57, pulse is 65, respiratory rate 20, temperature is 98.3, pulse ox is 97%.  She was said not to be in any distress, very pleasant. HEENT:  Pallor, but pupils are reactive to light and extraocular muscles are intact. NECK:  No jugular venous distention.  No carotid bruit.  No lymphadenopathy. CHEST:  Clear to auscultation. HEART:  Sounds are one and two. ABDOMEN:  Soft, nontender.  Liver, spleen and kidney not palpable. Bowel sounds are positive. EXTREMITIES:  Left BKA.  No pedal edema and there is a AV fistula with thrill on the left upper. NEUROLOGIC:  Nonfocal. MUSCULOSKELETAL SYSTEM:  Unremarkable. SKIN:  Shows slightly decreased turgor.  LAB DATA:  Initial complete blood count with no differential showed WBC of 7.3, hemoglobin of 8.0, hematocrit 24.2, MCV of 93.2, platelet count of 236.  Basic metabolic panel showed sodium of 135, potassium of 4.4, chloride of 93 with a bicarb of 29, glucose is 130, BUN is 41, creatinine is 6.73.  Urinalysis unremarkable.  Urine microscopy unremarkable.  Magnesium level is 2.1, phosphorus 3.6.  Routine MRSA screening negative.  Thyroid panel TSH 0.183 normal.  Repeat complete blood count with no differential done on April 05, 2011 showed WBC of 5.6, hemoglobin 7.5, hematocrit 22.4, MCV 93.2 with a platelet count  of 206 and kidney function done on April 05, 2011 showed sodium of 135, potassium of 4.5, chloride of 94 with a bicarb of 28, glucose is 161, BUN is 45, creatinine is 7.57 and a repeat complete blood count with differential done on March 29, 2011, showed WBC of 5.8, hemoglobin of 7.8, hematocrit of 23.4, MCV of 94.4 with a platelet count of 216 and a comprehensive metabolic panel showed a sodium of 137, potassium of 3.8, chloride of 96, bicarb of 31, glucose is 161, BUN is 23, creatinine is 4.46 and a repeat complete blood count differential (post transfusion done on April 07, 2011 showed WBC of 7.4, hemoglobin 8.5, hematocrit of 25.7, MCV of 92.2 with a platelet count of 2007 and comprehensive metabolic panel done on April 07, 2011 showed sodium of 134, potassium of 4.0, chloride of 95 with a bicarb of 29, glucose is 145, BUN is 42, creatinine is 6.40, magnesium level is 2.0.  EKG that was done on admission was said to have showed normal sinus rhythm with a rate of 60 beats per minute, LVH with nonspecific ST-T wave changes.  HOSPITAL COURSE:  The patient was admitted to renal unit.  She was dialyzed by the nephrologist and during dialysis, she was given Aranesp.  Other medication given to the patient include amlodipine 10 mg p.o. daily, aspirin 81 mg p.o. daily, Aranesp during dialysis, Cardura 80 mg p.o. at bedtime.  She was also restarted on doxycycline 100 mg p.o. daily and also given during dialysis was ferric gluconate.  Blood pressure was maintained with hydralazine as well as labetalol p.o. Patient was also restarted on Zocor 20 mg p.o. at bedtime and she was placed on Accu-Cheks with regular insulin sliding scale.  In addition, she was given calcium carbonate 500 mg p.o. q. 6 p.r.n.  The patient was seen by me for the very first time in this admission on April 05, 2011 and during this encounter she complained about feeling weak, but denied any chest pain or shortness of breath and  examination of the patient was essentially unremarkable.  However, her hemoglobin was on the downward trend.  She was subsequently reevaluated by me on two occasions thereafter and she complained about weakness, but hemoglobin dropped to less than 8 g.  She was seen by me yesterday, feels better with occasional weakness.  Examination showed the patient to be pale.  Her vital signs were stable.  However, after discussion with the patient, she was agreeable to be transfused with 1 unit of packed RBCs.  She was reevaluated today which is April 07, 2011, feels better following 1 unit of packed RBCs.  Denies any specific complaint.  Examination was essentially unremarkable.  Vital signs, blood pressure is 138/68, pulse 69, respiratory rate 20, temperature is 98.6, medically stable.  Plan is for the patient to be discharged home today on activity as tolerated. Continue  dialysis as per her schedule and we have nebs as well as ferric gluconate during dialysis.  She will be followed by her primary care doctor in 1 to 2 weeks and also by her nephrologist.  MEDICATION TO BE TAKEN AT HOME:  Will include; 1. Calcium carbonate 500 mg one p.o. q. 6. 2. Aranesp 100 mcg IV during dialysis. 3. Calcium gluconate 62.5 mg IV during dialysis.4. Nutrition supplement (Nephro with cap, vanilla 237 mL by mouth     three times p.r.n.). 5. Acetaminophen 500 mg 1 to 2 tablets p.o. q. 6 p.r.n. 6. Amaryl 4 mg one p.o. q.a.m. 7. Amlodipine 10 mg one p.o. daily. 8. Aspirin 325 mg one p.o. daily. 9. Doxazosin 8 mg one p.o. daily at bedtime. 10.Doxycycline 100 mg one p.o. daily. 11.Hydralazine 25 mg 1 to 1-1/2 tablet p.o. three times a day. 12.Labetalol 200 mg p.o. b.i.d. 13.Simvastatin 20 mg one p.o. daily. 14.Dialysis as per her nephrologist on Monday, Wednesday and Friday.     Talmage Nap, MD     CN/MEDQ  D:  04/07/2011  T:  04/07/2011  Job:  [id number]  cc:   Margaretmary Bayley, M.D.  Electronically Signed  by Talmage Nap  on 04/07/2011 06:45:00 PM

## 2011-04-26 NOTE — H&P (Signed)
NAME:  Briana Gould, ADOLF NO.:  [id number]  MEDICAL RECORD NO.:  [id number]  LOCATION:  [facility]                         FACILITY:  [facility]  PHYSICIAN:  Jonny Ruiz, MD    DATE OF BIRTH:  08-Sep-1946  DATE OF ADMISSION:  04/04/2011 DATE OF DISCHARGE:                             HISTORY & PHYSICAL   CHIEF COMPLAINT:  Weakness.  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  The patient has been somewhat lethargic and with no energy to do anything.  She has been just sitting or lying down and the daughter states that is not her.  The patient went and saw her primary care physician, Dr. Chestine Spore on Tuesday who orders blood work and gave her B12 shot and since they did not hear back from him yesterday, she decided to bring the patient to the emergency department.  The patient was vacationing in [place] last week and she was not sure if she is just exhausted from that trip.  She denies exposure to sick contacts while being there.  Denies any fever, chills, night sweats, anorexia, nausea, vomiting or diarrhea.  Denies chest pain, shortness of breath or palpitations.  Denies headaches, focal weakness, numbness or paresthesias.  Denies skin rash, joint swelling or myalgias.  PAST MEDICAL HISTORY: 1. Type 2 diabetes mellitus, diagnosed in 2002. 2. Hypertension. 3. Hypercholesterolemia. 4. Peripheral arterial disease.  SURGICAL HISTORY:  Below-knee amputation on the left due to unsuccessful fem-pop bypass, which to place around the same time when she was diagnosed with type 2 diabetes in 2002.  She had left upper arm arteriovenous fistula  for hemodialysis November 2011.  CURRENT MEDICATIONS: 1. Labetalol 200 mg b.i.d. 2. Tylenol 500 1-2 tablets every 6 hours p.r.n. 3. Aspirin 325 mg a day. 4. Amlodipine 10 mg a day. 5. Nephro-Vite once a day. 6. Simvastatin 20 mg at bedtime. 7. Doxazosin 8 mg at bedtime. 8. Doxycycline 100 mg daily. 9. Amaryl 4 mg a day. 10.Hydralazine 25 mg 1-1/2 tablet 3 times a day.  ALLERGIES AND INTOLERANCES:  Oxycodone/APAP (Percocet/Tylox/Roxicet) causes GI upset.  PRIMARY CARE PHYSICIAN:  Margaretmary Bayley, MD  NEPHROLOGIST:  Dr. Lowell Guitar.  FAMILY HISTORY:  Noncontributory.  SOCIAL HISTORY:  The patient is a recently retired Librarian, academic for [employer].  Nonsmoker, not a drinker.  Lives with her husband.  REVIEW OF SYSTEMS:  As in HPI.  PHYSICAL EXAMINATION:  VITAL SIGNS:  Blood pressure 149/57, pulse 65, respirations 20, temperature 98.3, pulse ox 97%. GENERAL APPEARANCE:  The patient is a overweight African American female who appears in no acute distress.  She is alert, pleasant and verycooperative. HEENT:  Unremarkable. NECK:  Supple without distention of the jugular veins.  No lymphadenopathy. HEART:  Regular S1 and S2 without gallops, murmurs or rubs. LUNGS:  Clear to auscultation. ABDOMEN:  Obese with no bowel sounds that I can appreciate.  No bruits. Soft, nontender without  organomegaly or masses palpable. EXTREMITIES:  She half a below-the-knee amputation, which has healed well right leg.  No clubbing, cyanosis or edema.  Left upper extremity has AV fistula with a thrill on palpation.  No skin erythema, warmth or tenderness. NEUROLOGIC:  PERRLA, EOMI.  No visual field deficits.  Cranial nerves II through XII intact.  Motor strength 5/5 upper and lower extremities. Sensory intact.  Babinski negative on the right.  The reflex is 2+.  DTR right patella.  Gait not assessed.  Speech clear. SKIN:  Mucosa normal without rashes, slightly  pale.  DIAGNOSTIC DATA:  EKG normal sinus rhythm at 68 beats per minute with left ventricular hypertrophy and nonspecific intraventricular conduction delay.  Nonspecific ST and T-wave changes.  LABORATORY DATA:  UA is negative except for a total protein greater than 300.  BMET:  Sodium 135, potassium 4.4, chloride 93, carbon dioxide 29, glucose 130, BUN 41, creatinine 6.73, calcium 10.0.  CBC; WBC 7.3, hemoglobin 8, hematocrit 24.2, MCV 93.1, platelet count 236.  MEDICAL DECISION MAKING:  A 65 year old black female with multiple chronic medical problems including hypertension, hyperlipidemia, type 2 diabetes mellitus, anemia, and end-stage renal disease on hemodialysis presenting with progressive generalized weakness without any clear cut etiology at this point other than perhaps her anemia. 1. Generalized weakness.  We will admit the patient to medical unit     for observation and proceed with her regular dialysis in the     morning.  The patient might be having her anemia from her recent     trip to [place] compounded by her chronic anemia.  I do not find     any focal neurological deficits or cardiac explanation for hergeneralized weakness.  She does not have any electrolyte deficits.     No indication of infection.  Observe for now. 2. Anemia.  We will reassess her H and H after dialysis and decide on     the need for transfusion.  We will like to hear from Dr. Lowell Guitar     what he has to say on the use of EPO discharge. 3. Type 2 diabetes mellitus.  Discontinue Amaryl.  Follow insulin     sliding scale coverage per protocol. 4. Hypertension.  Continue outpatient medications including labetalol     200 mg b.i.d., amlodipine 10 mg a day, doxazosin 8 mg at bedtime     and hydralazine 25 mg 1.5 tablets 3 times a day. 5. Hyperlipidemia.  Continue simvastatin 20 mg at bedtime. 6. Peripheral arterial disease, status post left BKA November 2011.     Stable at this point. 7. Preventative.   Lovenox per protocol.  Continue aspirin 81 mg a day.  DISPOSITION:  The patient will be admitted to [facility] team 5. Observation unit.  Expected discharged after dialysis.          ______________________________ Jonny Ruiz, MD     GL/MEDQ  D:  04/05/2011  T:  04/05/2011  Job:  [id number]  cc:   Margaretmary Bayley, M.D. Mindi Slicker. Lowell Guitar, M.D.  Electronically Signed by Jonny Ruiz MD on 04/26/2011 03:32:27 PM

## 2011-09-30 ENCOUNTER — Other Ambulatory Visit: Payer: Self-pay

## 2011-10-01 ENCOUNTER — Other Ambulatory Visit: Payer: Self-pay

## 2011-10-01 MED ORDER — LABETALOL HCL 200 MG PO TABS: 200.0000 mg | ORAL_TABLET | Freq: Two times a day (BID) | ORAL | Status: DC

## 2011-10-31 ENCOUNTER — Encounter (HOSPITAL_COMMUNITY): Payer: Self-pay | Admitting: *Deleted

## 2011-10-31 ENCOUNTER — Emergency Department (HOSPITAL_COMMUNITY): Payer: BC Managed Care – PPO

## 2011-10-31 ENCOUNTER — Emergency Department (HOSPITAL_COMMUNITY)
Admission: EM | Admit: 2011-10-31 | Discharge: 2011-10-31 | Disposition: A | Discharge status: Home Health | Payer: BC Managed Care – PPO | Attending: Emergency Medicine | Admitting: Emergency Medicine

## 2011-10-31 ENCOUNTER — Other Ambulatory Visit: Payer: Self-pay

## 2011-10-31 DIAGNOSIS — R079 Chest pain, unspecified: Secondary | ICD-10-CM | POA: Insufficient documentation

## 2011-10-31 DIAGNOSIS — J40 Bronchitis, not specified as acute or chronic: Secondary | ICD-10-CM | POA: Insufficient documentation

## 2011-10-31 DIAGNOSIS — N186 End stage renal disease: Secondary | ICD-10-CM | POA: Insufficient documentation

## 2011-10-31 DIAGNOSIS — Z79899 Other long term (current) drug therapy: Secondary | ICD-10-CM | POA: Insufficient documentation

## 2011-10-31 DIAGNOSIS — R0602 Shortness of breath: Secondary | ICD-10-CM | POA: Insufficient documentation

## 2011-10-31 DIAGNOSIS — I120 Hypertensive chronic kidney disease with stage 5 chronic kidney disease or end stage renal disease: Secondary | ICD-10-CM | POA: Insufficient documentation

## 2011-10-31 HISTORY — DX: Dependence on renal dialysis: N18.6

## 2011-10-31 HISTORY — DX: Essential (primary) hypertension: I10

## 2011-10-31 HISTORY — DX: End stage renal disease: Z99.2

## 2011-10-31 LAB — CBC
HCT: 32.2 % — ABNORMAL LOW (ref 36.0–46.0)
Hemoglobin: 10.5 g/dL — ABNORMAL LOW (ref 12.0–15.0)
MCV: 93.6 fL (ref 78.0–100.0)
RBC: 3.44 MIL/uL — ABNORMAL LOW (ref 3.87–5.11)
WBC: 7.6 10*3/uL (ref 4.0–10.5)

## 2011-10-31 LAB — BASIC METABOLIC PANEL
BUN: 29 mg/dL — ABNORMAL HIGH (ref 6–23)
CO2: 28 mEq/L (ref 19–32)
Chloride: 94 mEq/L — ABNORMAL LOW (ref 96–112)
Creatinine, Ser: 6.16 mg/dL — ABNORMAL HIGH (ref 0.50–1.10)
Glucose, Bld: 139 mg/dL — ABNORMAL HIGH (ref 70–99)

## 2011-10-31 MED ORDER — ASPIRIN 325 MG PO TABS
325.0000 mg | ORAL_TABLET | ORAL | Status: AC
  Administered 2011-10-31: 325 mg via ORAL

## 2011-10-31 MED ORDER — AZITHROMYCIN 250 MG PO TABS
500.0000 mg | ORAL_TABLET | Freq: Once | ORAL | Status: AC
  Administered 2011-10-31: 500 mg via ORAL
  Filled 2011-10-31: qty 2

## 2011-10-31 MED ORDER — AZITHROMYCIN 250 MG PO TABS: 250.0000 mg | ORAL_TABLET | Freq: Every day | ORAL | Status: DC

## 2011-10-31 MED ORDER — AEROCHAMBER Z-STAT PLUS/MEDIUM MISC
1.0000 | Freq: Once | Status: AC
  Administered 2011-10-31: 1
  Filled 2011-10-31: qty 1

## 2011-10-31 MED ORDER — ALBUTEROL SULFATE HFA 108 (90 BASE) MCG/ACT IN AERS
2.0000 | INHALATION_SPRAY | RESPIRATORY_TRACT | Status: DC | PRN
  Administered 2011-10-31: 2 via RESPIRATORY_TRACT
  Filled 2011-10-31 (×2): qty 6.7

## 2011-10-31 MED ORDER — NITROGLYCERIN 0.4 MG SL SUBL: 0.4000 mg | SUBLINGUAL_TABLET | SUBLINGUAL | Status: DC | PRN

## 2011-10-31 NOTE — ED Notes (Signed)
Pt is here for evaluation of CP that began last night.  Pain is non radiating and in her left chest, associated with nausea  And some sob.  Pt also has a cough

## 2011-10-31 NOTE — ED Notes (Signed)
Patient is AOx4 and comfortable with her discharge instructions. 

## 2011-10-31 NOTE — Discharge Instructions (Signed)
Use the inhaler, 2 puffs every 3-4 hours as needed for cough or shortness of breath. Take Tylenol for achiness and fever. Start the antibody prescriptions tomorrow.  Bronchitis Bronchitis is a problem of the air tubes leading to your lungs. This problem makes it hard for air to get in and out of the lungs. You may cough a lot because your air tubes are narrow. Going without care can cause lasting (chronic) bronchitis. HOME CARE   Drink enough fluids to keep your pee (urine) clear or pale yellow.   Use a cool mist humidifier.   Quit smoking if you smoke. If you keep smoking, the bronchitis might not get better.   Only take medicine as told by your doctor.  GET HELP RIGHT AWAY IF:   Coughing keeps you awake.   You start to wheeze.   You become more sick or weak.   You have a hard time breathing or get short of breath.   You cough up blood.   Coughing lasts more than 2 weeks.   You have a fever.   Your baby is older than 3 months with a rectal temperature of 102 F (38.9 C) or higher.   Your baby is 77 months old or younger with a rectal temperature of 100.4 F (38 C) or higher.  MAKE SURE YOU:  Understand these instructions.   Will watch your condition.   Will get help right away if you are not doing well or get worse.  Document Released: 02/12/2008 Document Revised: 05/08/2011 Document Reviewed: 07/28/2009 [facility] Patient Information 2012 [place], [place].

## 2011-11-01 NOTE — ED Provider Notes (Signed)
History     CSN: [id number]  Arrival date & time 10/31/11  4098   First MD Initiated Contact with Patient 10/31/11 2221      Chief Complaint  Patient presents with  . Chest Pain    (Consider location/radiation/quality/duration/timing/severity/associated sxs/prior treatment) HPI Briana Gould is a 66 y.o. female she has had, generalized achiness, arms, legs, chest, and headache for one day. She has had cough, and rhinorrhea. She's not having nausea, vomiting. She was hungry when evaluated in the emergency department. She has been taking her dialysis regularly and is due again tomorrow. She has no diaphoresis, fever, chills, weakness. She has not tried taking anything for that problem.       Past Medical History  Diagnosis Date  . End stage renal disease on dialysis   . Diabetes mellitus   . Hypertension     Past Surgical History  Procedure Date  . Av fistula placement   . Below knee leg amputation     No family history on file.  History  Substance Use Topics  . Smoking status: Not on file  . Smokeless tobacco: Not on file  . Alcohol Use: No    OB History    Grav Para Term Preterm Abortions TAB SAB Ect Mult Living                  Review of Systems  All other systems reviewed and are negative.    Allergies  Review of patient's allergies indicates no known allergies.  Home Medications   Current Outpatient Rx  Name Route Sig Dispense Refill  . AMLODIPINE BESYLATE 10 MG PO TABS Oral Take 10 mg by mouth daily.    Marland Kitchen HYDRALAZINE HCL 25 MG PO TABS Oral Take 37.5 mg by mouth 3 (three) times daily.    Marland Kitchen LABETALOL HCL 200 MG PO TABS Oral Take 200 mg by mouth 2 (two) times daily.    Marland Kitchen SIMVASTATIN 20 MG PO TABS Oral Take 20 mg by mouth every evening.    . AZITHROMYCIN 250 MG PO TABS Oral Take 1 tablet (250 mg total) by mouth daily. Take first 2 tablets together, then 1 every day until finished. 6 tablet 0    BP 172/54  Pulse 78  Temp(Src) 100 F (37.8  C) (Oral)  Resp 20  SpO2 100%  Physical Exam  Nursing note and vitals reviewed. Constitutional: She is oriented to person, place, and time. She appears well-developed and well-nourished.  HENT:  Head: Normocephalic and atraumatic.  Eyes: Conjunctivae and EOM are normal. Pupils are equal, round, and reactive to light.  Neck: Normal range of motion and phonation normal. Neck supple.  Cardiovascular: Normal rate, regular rhythm and intact distal pulses.   Pulmonary/Chest: Effort normal. She exhibits no tenderness.       She has generalized decreased abnormal with scattered rhonchi and wheezes  Abdominal: Soft. She exhibits no distension. There is no tenderness. There is no guarding.  Musculoskeletal: Normal range of motion.       Fistula left upper arm with normal thrill.  Neurological: She is alert and oriented to person, place, and time. She has normal strength. She exhibits normal muscle tone.  Skin: Skin is warm and dry.  Psychiatric: She has a normal mood and affect. Her behavior is normal. Judgment and thought content normal.    ED Course  Procedures (including critical care time)   Date: 11/01/2011  Rate: 80  Rhythm: normal sinus rhythm  QRS Axis: normal  Intervals: normal  ST/T Wave abnormalities: nonspecific T wave changes  Conduction Disutrbances:nonspecific intraventricular conduction delay  Narrative Interpretation:   Old EKG Reviewed: unchanged       Labs Reviewed  CBC - Abnormal; Notable for the following:    RBC 3.44 (*)    Hemoglobin 10.5 (*)    HCT 32.2 (*)    RDW 15.9 (*)    All other components within normal limits  BASIC METABOLIC PANEL - Abnormal; Notable for the following:    Chloride 94 (*)    Glucose, Bld 139 (*)    BUN 29 (*)    Creatinine, Ser 6.16 (*)    Calcium 10.8 (*)    GFR calc non Af Amer 6 (*)    GFR calc Af Amer 7 (*)    All other components within normal limits  PRO B NATRIURETIC PEPTIDE - Abnormal; Notable for the following:      Pro B Natriuretic peptide (BNP) 51699.0 (*)    All other components within normal limits  TROPONIN I   Dg Chest 2 View  10/31/2011  *RADIOLOGY REPORT*  Clinical Data: Chest pain, cough and shortness of breath.  Chest congestion.  Controlled hypertension.  CHEST - 2 VIEW  Comparison: 04/05/2011.  Findings: Stable enlarged cardiac silhouette and mildly prominent pulmonary vasculature and interstitial markings.  Stable mild diffuse peribronchial thickening.  Thoracic spine degenerative changes.  IMPRESSION: No acute abnormality.  Stable cardiomegaly, mild pulmonary vascular congestion, mild bronchitic changes and mild chronic interstitial lung disease.  Original Report Authenticated By: Darrol Angel, M.D.   Emergency department treatment: Zithromax by mouth and albuterol inhaler. She'll be discharged with the inhaler, and AeroChamber  1. Bronchitis       MDM  Patient with symptoms of upper and lower respiratory infection. She has normal O2 saturation. She is stable for discharge. Doubt ACS, pulmonary edema, serious bacterial infection or metabolic instability.        Flint Melter, MD 11/01/11 [phone]

## 2011-11-03 ENCOUNTER — Emergency Department (HOSPITAL_COMMUNITY): Payer: BC Managed Care – PPO

## 2011-11-03 ENCOUNTER — Encounter (HOSPITAL_COMMUNITY): Payer: Self-pay | Admitting: *Deleted

## 2011-11-03 ENCOUNTER — Inpatient Hospital Stay (HOSPITAL_COMMUNITY)
Admission: EM | Admit: 2011-11-03 | Discharge: 2011-11-08 | DRG: 089 | Disposition: A | Discharge status: Home / Self Care | Payer: BC Managed Care – PPO | Source: Ambulatory Visit | Attending: Internal Medicine | Admitting: Internal Medicine

## 2011-11-03 DIAGNOSIS — I120 Hypertensive chronic kidney disease with stage 5 chronic kidney disease or end stage renal disease: Secondary | ICD-10-CM | POA: Diagnosis present

## 2011-11-03 DIAGNOSIS — N2581 Secondary hyperparathyroidism of renal origin: Secondary | ICD-10-CM | POA: Diagnosis present

## 2011-11-03 DIAGNOSIS — E785 Hyperlipidemia, unspecified: Secondary | ICD-10-CM | POA: Diagnosis present

## 2011-11-03 DIAGNOSIS — N186 End stage renal disease: Secondary | ICD-10-CM | POA: Diagnosis present

## 2011-11-03 DIAGNOSIS — I2510 Atherosclerotic heart disease of native coronary artery without angina pectoris: Secondary | ICD-10-CM | POA: Diagnosis present

## 2011-11-03 DIAGNOSIS — E1122 Type 2 diabetes mellitus with diabetic chronic kidney disease: Secondary | ICD-10-CM | POA: Diagnosis present

## 2011-11-03 DIAGNOSIS — M6281 Muscle weakness (generalized): Secondary | ICD-10-CM | POA: Diagnosis present

## 2011-11-03 DIAGNOSIS — K5900 Constipation, unspecified: Secondary | ICD-10-CM | POA: Diagnosis not present

## 2011-11-03 DIAGNOSIS — M899 Disorder of bone, unspecified: Secondary | ICD-10-CM | POA: Diagnosis present

## 2011-11-03 DIAGNOSIS — Z992 Dependence on renal dialysis: Secondary | ICD-10-CM | POA: Diagnosis present

## 2011-11-03 DIAGNOSIS — E119 Type 2 diabetes mellitus without complications: Secondary | ICD-10-CM | POA: Diagnosis present

## 2011-11-03 DIAGNOSIS — I739 Peripheral vascular disease, unspecified: Secondary | ICD-10-CM | POA: Diagnosis present

## 2011-11-03 DIAGNOSIS — Z89519 Acquired absence of unspecified leg below knee: Secondary | ICD-10-CM | POA: Diagnosis present

## 2011-11-03 DIAGNOSIS — Z9119 Patient's noncompliance with other medical treatment and regimen: Secondary | ICD-10-CM

## 2011-11-03 DIAGNOSIS — J189 Pneumonia, unspecified organism: Principal | ICD-10-CM | POA: Diagnosis present

## 2011-11-03 DIAGNOSIS — R531 Weakness: Secondary | ICD-10-CM | POA: Diagnosis present

## 2011-11-03 DIAGNOSIS — I10 Essential (primary) hypertension: Secondary | ICD-10-CM | POA: Diagnosis present

## 2011-11-03 DIAGNOSIS — Z91199 Patient's noncompliance with other medical treatment and regimen due to unspecified reason: Secondary | ICD-10-CM

## 2011-11-03 DIAGNOSIS — S88119A Complete traumatic amputation at level between knee and ankle, unspecified lower leg, initial encounter: Secondary | ICD-10-CM

## 2011-11-03 DIAGNOSIS — D649 Anemia, unspecified: Secondary | ICD-10-CM | POA: Diagnosis present

## 2011-11-03 HISTORY — DX: Peripheral vascular disease, unspecified: I73.9

## 2011-11-03 LAB — DIFFERENTIAL
Basophils Absolute: 0 10*3/uL (ref 0.0–0.1)
Eosinophils Absolute: 0 10*3/uL (ref 0.0–0.7)
Lymphocytes Relative: 10 % — ABNORMAL LOW (ref 12–46)
Monocytes Absolute: 0.4 10*3/uL (ref 0.1–1.0)
Neutro Abs: 6.8 10*3/uL (ref 1.7–7.7)
Neutrophils Relative %: 85 % — ABNORMAL HIGH (ref 43–77)

## 2011-11-03 LAB — CBC
Hemoglobin: 11 g/dL — ABNORMAL LOW (ref 12.0–15.0)
MCHC: 33 g/dL (ref 30.0–36.0)
Platelets: 210 10*3/uL (ref 150–400)
RBC: 3.57 MIL/uL — ABNORMAL LOW (ref 3.87–5.11)

## 2011-11-03 LAB — GLUCOSE, CAPILLARY: Glucose-Capillary: 204 mg/dL — ABNORMAL HIGH (ref 70–99)

## 2011-11-03 LAB — COMPREHENSIVE METABOLIC PANEL
Albumin: 3.7 g/dL (ref 3.5–5.2)
Alkaline Phosphatase: 87 U/L (ref 39–117)
BUN: 38 mg/dL — ABNORMAL HIGH (ref 6–23)
Calcium: 10.9 mg/dL — ABNORMAL HIGH (ref 8.4–10.5)
GFR calc Af Amer: 7 mL/min — ABNORMAL LOW (ref >90)
Potassium: 4.4 mEq/L (ref 3.5–5.1)
Total Protein: 7.9 g/dL (ref 6.0–8.3)

## 2011-11-03 LAB — LIPASE, BLOOD: Lipase: 21 U/L (ref 11–59)

## 2011-11-03 MED ORDER — VANCOMYCIN HCL IN DEXTROSE 1-5 GM/200ML-% IV SOLN
1000.0000 mg | Freq: Once | INTRAVENOUS | Status: AC
  Administered 2011-11-04: 1000 mg via INTRAVENOUS
  Filled 2011-11-03: qty 200

## 2011-11-03 MED ORDER — ONDANSETRON HCL 4 MG/2ML IJ SOLN
4.0000 mg | Freq: Once | INTRAMUSCULAR | Status: AC
  Administered 2011-11-04: 4 mg via INTRAVENOUS
  Filled 2011-11-03: qty 2

## 2011-11-03 MED ORDER — SODIUM CHLORIDE 0.9 % IV SOLN
Freq: Once | INTRAVENOUS | Status: AC
  Administered 2011-11-04: 01:00:00 via INTRAVENOUS

## 2011-11-03 MED ORDER — PIPERACILLIN-TAZOBACTAM 3.375 G IVPB
3.3750 g | Freq: Once | INTRAVENOUS | Status: AC
  Administered 2011-11-04: 3.375 g via INTRAVENOUS
  Filled 2011-11-03: qty 50

## 2011-11-03 NOTE — ED Notes (Signed)
Pt's daughter stated pt was seen on this past Thursday and was diagnosed with bronchitis.  Pt was prescribed with antibiotics that she was taking as scheduled.  Pt was not able to take any today due to continued vomiting.  Last dose of antibiotics was yesterday.  Pt's daughter states she has generalized weakness - had dialysis on Saturday and was able to complete the treatment.  Regular days are MWF.

## 2011-11-03 NOTE — ED Provider Notes (Signed)
History     CSN: [id number]  Arrival date & time 11/03/11  2035   First MD Initiated Contact with Patient 11/03/11 2136      Chief Complaint  Patient presents with  . Emesis    (Consider location/radiation/quality/duration/timing/severity/associated sxs/prior treatment) Patient is a 66 y.o. female presenting with vomiting. The history is provided by the patient and a relative.  Emesis    patient presents with cough and shortness of breath with associated vomiting x3 days. Seen and treated for bronchitis 3 days ago. Cough is improved but now more productive. Diarrhea and vomiting noted mostly posttussive emesis. No rash. Patient just finished her Zithromax dose for bronchitis. Patient feels more short of breath that is exertional. Patient is a dialysis patient and was dialyzed yesterday  Past Medical History  Diagnosis Date  . End stage renal disease on dialysis   . Diabetes mellitus   . Hypertension     Past Surgical History  Procedure Date  . Av fistula placement   . Below knee leg amputation     History reviewed. No pertinent family history.  History  Substance Use Topics  . Smoking status: Not on file  . Smokeless tobacco: Not on file  . Alcohol Use: No    OB History    Grav Para Term Preterm Abortions TAB SAB Ect Mult Living                  Review of Systems  Gastrointestinal: Positive for vomiting.  All other systems reviewed and are negative.    Allergies  Review of patient's allergies indicates no known allergies.  Home Medications   Current Outpatient Rx  Name Route Sig Dispense Refill  . AMLODIPINE BESYLATE 10 MG PO TABS Oral Take 10 mg by mouth daily.    . AZITHROMYCIN 250 MG PO TABS Oral Take 250 mg by mouth daily. Take first 2 tablets together, then 1 every day until finished.    Marland Kitchen HYDRALAZINE HCL 25 MG PO TABS Oral Take 37.5 mg by mouth 3 (three) times daily.    Marland Kitchen LABETALOL HCL 200 MG PO TABS Oral Take 200 mg by mouth 2 (two) times daily.     Marland Kitchen SIMVASTATIN 20 MG PO TABS Oral Take 20 mg by mouth every evening.      BP 196/75  Pulse 86  Temp(Src) 100 F (37.8 C) (Oral)  Resp 35  SpO2 100%  Physical Exam  Nursing note and vitals reviewed. Constitutional: She is oriented to person, place, and time. She appears well-developed and well-nourished.  Non-toxic appearance. No distress.  HENT:  Head: Normocephalic and atraumatic.  Eyes: Conjunctivae, EOM and lids are normal. Pupils are equal, round, and reactive to light.  Neck: Normal range of motion. Neck supple. No tracheal deviation present. No mass present.  Cardiovascular: Normal rate, regular rhythm and normal heart sounds.  Exam reveals no gallop.   No murmur heard. Pulmonary/Chest: Effort normal. No stridor. No respiratory distress. She has no decreased breath sounds. She has wheezes. She has rhonchi. She has no rales.  Abdominal: Soft. Normal appearance and bowel sounds are normal. She exhibits no distension. There is no tenderness. There is no rebound and no CVA tenderness.  Musculoskeletal: Normal range of motion. She exhibits no edema and no tenderness.  Neurological: She is alert and oriented to person, place, and time. She has normal strength. No cranial nerve deficit or sensory deficit. GCS eye subscore is 4. GCS verbal subscore is 5. GCS motor subscore  is 6.  Skin: Skin is warm and dry. No abrasion and no rash noted.  Psychiatric: She has a normal mood and affect. Her speech is normal and behavior is normal.    ED Course  Procedures (including critical care time)  Labs Reviewed  GLUCOSE, CAPILLARY - Abnormal; Notable for the following:    Glucose-Capillary 204 (*)    All other components within normal limits  CBC  DIFFERENTIAL  COMPREHENSIVE METABOLIC PANEL  LIPASE, BLOOD   No results found.   No diagnosis found.    MDM  Pt given zosyn and vancomycin for suspected pneumonia given pts h/o cough, fever-pt dialyzed yesterday for her usual  duration--will admit to triad        Toy Baker, MD 11/03/11 [phone]

## 2011-11-03 NOTE — ED Notes (Signed)
Vomiting since yesterday.  She was seen in the ed the 20th for another problem.  C/o weqkness

## 2011-11-04 ENCOUNTER — Encounter (HOSPITAL_COMMUNITY): Payer: Self-pay | Admitting: Internal Medicine

## 2011-11-04 ENCOUNTER — Inpatient Hospital Stay (HOSPITAL_COMMUNITY): Payer: BC Managed Care – PPO

## 2011-11-04 ENCOUNTER — Other Ambulatory Visit: Payer: Self-pay

## 2011-11-04 DIAGNOSIS — J189 Pneumonia, unspecified organism: Secondary | ICD-10-CM | POA: Diagnosis present

## 2011-11-04 DIAGNOSIS — Z992 Dependence on renal dialysis: Secondary | ICD-10-CM | POA: Diagnosis present

## 2011-11-04 LAB — CBC
HCT: 32.3 % — ABNORMAL LOW (ref 36.0–46.0)
Hemoglobin: 10.7 g/dL — ABNORMAL LOW (ref 12.0–15.0)
MCH: 31 pg (ref 26.0–34.0)
MCH: 30.9 pg (ref 26.0–34.0)
MCHC: 33.8 g/dL (ref 30.0–36.0)
MCV: 91.9 fL (ref 78.0–100.0)
MCV: 92.6 fL (ref 78.0–100.0)
Platelets: 195 10*3/uL (ref 150–400)
RBC: 3.45 MIL/uL — ABNORMAL LOW (ref 3.87–5.11)
RBC: 3.49 MIL/uL — ABNORMAL LOW (ref 3.87–5.11)
RDW: 15.8 % — ABNORMAL HIGH (ref 11.5–15.5)
WBC: 9.5 10*3/uL (ref 4.0–10.5)

## 2011-11-04 LAB — COMPREHENSIVE METABOLIC PANEL
AST: 47 U/L — ABNORMAL HIGH (ref 0–37)
Albumin: 3.5 g/dL (ref 3.5–5.2)
Alkaline Phosphatase: 85 U/L (ref 39–117)
Chloride: 94 mEq/L — ABNORMAL LOW (ref 96–112)
Potassium: 4.7 mEq/L (ref 3.5–5.1)
Total Bilirubin: 0.5 mg/dL (ref 0.3–1.2)
Total Protein: 7.4 g/dL (ref 6.0–8.3)

## 2011-11-04 LAB — CARDIAC PANEL(CRET KIN+CKTOT+MB+TROPI)
CK, MB: 5.4 ng/mL — ABNORMAL HIGH (ref 0.3–4.0)
CK, MB: 5.8 ng/mL — ABNORMAL HIGH (ref 0.3–4.0)
Relative Index: 1.1 (ref 0.0–2.5)
Relative Index: 1.6 (ref 0.0–2.5)
Total CK: 470 U/L — ABNORMAL HIGH (ref 7–177)
Troponin I: 0.33 ng/mL (ref <0.30)
Troponin I: 0.5 ng/mL (ref <0.30)

## 2011-11-04 LAB — MRSA PCR SCREENING: MRSA by PCR: NEGATIVE

## 2011-11-04 LAB — GLUCOSE, CAPILLARY: Glucose-Capillary: 108 mg/dL — ABNORMAL HIGH (ref 70–99)

## 2011-11-04 LAB — HEMOGLOBIN A1C
Hgb A1c MFr Bld: 6.9 % — ABNORMAL HIGH (ref <5.7)
Mean Plasma Glucose: 151 mg/dL — ABNORMAL HIGH (ref <117)

## 2011-11-04 LAB — INFLUENZA PANEL BY PCR (TYPE A & B)
H1N1 flu by pcr: NOT DETECTED
Influenza B By PCR: NEGATIVE

## 2011-11-04 LAB — CREATININE, SERUM: Creatinine, Ser: 6.82 mg/dL — ABNORMAL HIGH (ref 0.50–1.10)

## 2011-11-04 MED ORDER — RENA-VITE PO TABS
1.0000 | ORAL_TABLET | Freq: Every day | ORAL | Status: DC
  Filled 2011-11-04: qty 1

## 2011-11-04 MED ORDER — SODIUM CHLORIDE 0.9 % IV SOLN: 100.0000 mL | INTRAVENOUS | Status: DC | PRN

## 2011-11-04 MED ORDER — LEVOFLOXACIN IN D5W 750 MG/150ML IV SOLN
750.0000 mg | Freq: Once | INTRAVENOUS | Status: AC
  Administered 2011-11-04: 750 mg via INTRAVENOUS
  Filled 2011-11-04: qty 150

## 2011-11-04 MED ORDER — LIDOCAINE HCL (PF) 1 % IJ SOLN: 5.0000 mL | INTRAMUSCULAR | Status: DC | PRN

## 2011-11-04 MED ORDER — ONDANSETRON HCL 4 MG PO TABS: 4.0000 mg | ORAL_TABLET | Freq: Four times a day (QID) | ORAL | Status: DC | PRN

## 2011-11-04 MED ORDER — HEPARIN SODIUM (PORCINE) 1000 UNIT/ML DIALYSIS
8000.0000 [IU] | INTRAMUSCULAR | Status: DC | PRN
  Administered 2011-11-04: 8000 [IU] via INTRAVENOUS_CENTRAL
  Filled 2011-11-04: qty 8

## 2011-11-04 MED ORDER — VANCOMYCIN HCL 1000 MG IV SOLR
750.0000 mg | INTRAVENOUS | Status: DC
  Administered 2011-11-04 – 2011-11-08 (×3): 750 mg via INTRAVENOUS
  Filled 2011-11-04 (×5): qty 750

## 2011-11-04 MED ORDER — ACETAMINOPHEN 650 MG RE SUPP
650.0000 mg | Freq: Four times a day (QID) | RECTAL | Status: DC | PRN
  Filled 2011-11-04: qty 1

## 2011-11-04 MED ORDER — ONDANSETRON HCL 4 MG/2ML IJ SOLN
INTRAMUSCULAR | Status: AC
  Administered 2011-11-04: 4 mg via INTRAVENOUS
  Filled 2011-11-04: qty 2

## 2011-11-04 MED ORDER — ASPIRIN EC 325 MG PO TBEC
325.0000 mg | DELAYED_RELEASE_TABLET | Freq: Every day | ORAL | Status: DC
  Administered 2011-11-05 – 2011-11-08 (×4): 325 mg via ORAL
  Filled 2011-11-04 (×5): qty 1

## 2011-11-04 MED ORDER — DEXTROSE 5 % IV SOLN
1.0000 g | Freq: Every day | INTRAVENOUS | Status: DC
  Administered 2011-11-04 – 2011-11-07 (×4): 1 g via INTRAVENOUS
  Filled 2011-11-04 (×5): qty 1

## 2011-11-04 MED ORDER — VANCOMYCIN HCL 500 MG IV SOLR
500.0000 mg | Freq: Once | INTRAVENOUS | Status: AC
  Administered 2011-11-04: 500 mg via INTRAVENOUS
  Filled 2011-11-04: qty 500

## 2011-11-04 MED ORDER — SORBITOL 70 % SOLN: 30.0000 mL | Status: DC | PRN

## 2011-11-04 MED ORDER — SIMVASTATIN 20 MG PO TABS
20.0000 mg | ORAL_TABLET | Freq: Every evening | ORAL | Status: DC
  Administered 2011-11-04 – 2011-11-07 (×3): 20 mg via ORAL
  Filled 2011-11-04 (×6): qty 1

## 2011-11-04 MED ORDER — CALCIUM CARBONATE 1250 MG/5ML PO SUSP
500.0000 mg | Freq: Four times a day (QID) | ORAL | Status: DC | PRN
  Administered 2011-11-06 (×2): 500 mg via ORAL
  Filled 2011-11-04 (×2): qty 5

## 2011-11-04 MED ORDER — ONDANSETRON HCL 4 MG PO TABS
4.0000 mg | ORAL_TABLET | Freq: Four times a day (QID) | ORAL | Status: DC | PRN
  Administered 2011-11-07: 4 mg via ORAL
  Filled 2011-11-04: qty 1

## 2011-11-04 MED ORDER — ENOXAPARIN SODIUM 30 MG/0.3ML ~~LOC~~ SOLN
30.0000 mg | Freq: Every day | SUBCUTANEOUS | Status: DC
  Administered 2011-11-04 – 2011-11-08 (×5): 30 mg via SUBCUTANEOUS
  Filled 2011-11-04 (×5): qty 0.3

## 2011-11-04 MED ORDER — ONDANSETRON HCL 4 MG/2ML IJ SOLN
4.0000 mg | Freq: Four times a day (QID) | INTRAMUSCULAR | Status: DC | PRN
  Administered 2011-11-04 (×2): 4 mg via INTRAVENOUS

## 2011-11-04 MED ORDER — ALTEPLASE 2 MG IJ SOLR
2.0000 mg | Freq: Once | INTRAMUSCULAR | Status: AC | PRN
  Filled 2011-11-04: qty 2

## 2011-11-04 MED ORDER — SEVELAMER CARBONATE 800 MG PO TABS
1600.0000 mg | ORAL_TABLET | Freq: Three times a day (TID) | ORAL | Status: DC
  Administered 2011-11-06: 1600 mg via ORAL
  Filled 2011-11-04 (×19): qty 2

## 2011-11-04 MED ORDER — ASPIRIN EC 325 MG PO TBEC
325.0000 mg | DELAYED_RELEASE_TABLET | Freq: Once | ORAL | Status: AC
  Administered 2011-11-04: 325 mg via ORAL
  Filled 2011-11-04: qty 1

## 2011-11-04 MED ORDER — LISINOPRIL 20 MG PO TABS
20.0000 mg | ORAL_TABLET | Freq: Every day | ORAL | Status: DC
  Administered 2011-11-04 – 2011-11-07 (×3): 20 mg via ORAL
  Filled 2011-11-04 (×6): qty 1

## 2011-11-04 MED ORDER — NEPRO/CARBSTEADY PO LIQD: 237.0000 mL | Freq: Three times a day (TID) | ORAL | Status: DC | PRN

## 2011-11-04 MED ORDER — DOCUSATE SODIUM 283 MG RE ENEM: 1.0000 | ENEMA | RECTAL | Status: DC | PRN

## 2011-11-04 MED ORDER — PARICALCITOL 5 MCG/ML IV SOLN
INTRAVENOUS | Status: AC
  Administered 2011-11-04: 6 ug via INTRAVENOUS
  Filled 2011-11-04: qty 1

## 2011-11-04 MED ORDER — SODIUM CHLORIDE 0.9 % IV SOLN
62.5000 mg | INTRAVENOUS | Status: DC
  Administered 2011-11-06: 62.5 mg via INTRAVENOUS
  Filled 2011-11-04 (×2): qty 5

## 2011-11-04 MED ORDER — ACETAMINOPHEN 325 MG PO TABS
650.0000 mg | ORAL_TABLET | Freq: Four times a day (QID) | ORAL | Status: DC | PRN
  Administered 2011-11-04 – 2011-11-05 (×2): 650 mg via ORAL
  Filled 2011-11-04 (×4): qty 2

## 2011-11-04 MED ORDER — AMLODIPINE BESYLATE 10 MG PO TABS
10.0000 mg | ORAL_TABLET | Freq: Every day | ORAL | Status: DC
  Filled 2011-11-04: qty 1

## 2011-11-04 MED ORDER — DEXTROSE 5 % IV SOLN
1.0000 g | Freq: Once | INTRAVENOUS | Status: AC
  Administered 2011-11-04: 1 g via INTRAVENOUS
  Filled 2011-11-04: qty 1

## 2011-11-04 MED ORDER — HYDRALAZINE HCL 25 MG PO TABS
37.5000 mg | ORAL_TABLET | Freq: Three times a day (TID) | ORAL | Status: DC
  Administered 2011-11-04 (×2): 37.5 mg via ORAL
  Filled 2011-11-04 (×4): qty 1.5

## 2011-11-04 MED ORDER — LABETALOL HCL 200 MG PO TABS
200.0000 mg | ORAL_TABLET | Freq: Two times a day (BID) | ORAL | Status: DC
  Administered 2011-11-04: 200 mg via ORAL
  Filled 2011-11-04 (×3): qty 1

## 2011-11-04 MED ORDER — DARBEPOETIN ALFA-POLYSORBATE 25 MCG/0.42ML IJ SOLN
INTRAMUSCULAR | Status: AC
  Administered 2011-11-04: 12.5 ug via INTRAVENOUS
  Filled 2011-11-04: qty 0.42

## 2011-11-04 MED ORDER — INSULIN ASPART 100 UNIT/ML ~~LOC~~ SOLN
0.0000 [IU] | Freq: Three times a day (TID) | SUBCUTANEOUS | Status: DC
  Administered 2011-11-04 – 2011-11-06 (×4): 1 [IU] via SUBCUTANEOUS
  Administered 2011-11-07 – 2011-11-08 (×4): 2 [IU] via SUBCUTANEOUS
  Filled 2011-11-04: qty 3

## 2011-11-04 MED ORDER — PARICALCITOL 5 MCG/ML IV SOLN
INTRAVENOUS | Status: AC
  Filled 2011-11-04: qty 1

## 2011-11-04 MED ORDER — LIDOCAINE-PRILOCAINE 2.5-2.5 % EX CREA: 1.0000 "application " | TOPICAL_CREAM | CUTANEOUS | Status: DC | PRN

## 2011-11-04 MED ORDER — RENA-VITE PO TABS
1.0000 | ORAL_TABLET | Freq: Every day | ORAL | Status: DC
  Administered 2011-11-05 – 2011-11-07 (×2): 1 via ORAL
  Filled 2011-11-04 (×5): qty 1

## 2011-11-04 MED ORDER — ASPIRIN EC 81 MG PO TBEC
81.0000 mg | DELAYED_RELEASE_TABLET | Freq: Every day | ORAL | Status: DC
  Filled 2011-11-04: qty 1

## 2011-11-04 MED ORDER — HYDROXYZINE HCL 25 MG PO TABS: 25.0000 mg | ORAL_TABLET | Freq: Three times a day (TID) | ORAL | Status: DC | PRN

## 2011-11-04 MED ORDER — ZOLPIDEM TARTRATE 5 MG PO TABS: 5.0000 mg | ORAL_TABLET | Freq: Every evening | ORAL | Status: DC | PRN

## 2011-11-04 MED ORDER — HEPARIN SODIUM (PORCINE) 1000 UNIT/ML DIALYSIS
1000.0000 [IU] | INTRAMUSCULAR | Status: DC | PRN
  Filled 2011-11-04: qty 1

## 2011-11-04 MED ORDER — ISOSORBIDE MONONITRATE ER 30 MG PO TB24
30.0000 mg | ORAL_TABLET | Freq: Every day | ORAL | Status: DC
  Administered 2011-11-04 – 2011-11-08 (×4): 30 mg via ORAL
  Filled 2011-11-04 (×6): qty 1

## 2011-11-04 MED ORDER — PENTAFLUOROPROP-TETRAFLUOROETH EX AERO: 1.0000 "application " | INHALATION_SPRAY | CUTANEOUS | Status: DC | PRN

## 2011-11-04 MED ORDER — SODIUM CHLORIDE 0.9 % IJ SOLN
3.0000 mL | Freq: Two times a day (BID) | INTRAMUSCULAR | Status: DC
  Administered 2011-11-04 (×2): 3 mL via INTRAVENOUS

## 2011-11-04 MED ORDER — LABETALOL HCL 200 MG PO TABS
400.0000 mg | ORAL_TABLET | Freq: Two times a day (BID) | ORAL | Status: DC
  Administered 2011-11-04 – 2011-11-08 (×7): 400 mg via ORAL
  Filled 2011-11-04 (×11): qty 2

## 2011-11-04 MED ORDER — HYDRALAZINE HCL 50 MG PO TABS
50.0000 mg | ORAL_TABLET | Freq: Three times a day (TID) | ORAL | Status: DC
  Administered 2011-11-04 – 2011-11-08 (×8): 50 mg via ORAL
  Filled 2011-11-04 (×18): qty 1

## 2011-11-04 MED ORDER — PARICALCITOL 5 MCG/ML IV SOLN
6.0000 ug | INTRAVENOUS | Status: DC
  Administered 2011-11-04 – 2011-11-08 (×3): 6 ug via INTRAVENOUS
  Filled 2011-11-04 (×3): qty 1.2

## 2011-11-04 MED ORDER — LABETALOL HCL 5 MG/ML IV SOLN
10.0000 mg | INTRAVENOUS | Status: DC | PRN
  Filled 2011-11-04 (×2): qty 4

## 2011-11-04 MED ORDER — SODIUM CHLORIDE 0.9 % IJ SOLN
3.0000 mL | Freq: Two times a day (BID) | INTRAMUSCULAR | Status: DC
  Administered 2011-11-04 – 2011-11-07 (×5): 3 mL via INTRAVENOUS

## 2011-11-04 MED ORDER — CAMPHOR-MENTHOL 0.5-0.5 % EX LOTN
1.0000 "application " | TOPICAL_LOTION | Freq: Three times a day (TID) | CUTANEOUS | Status: DC | PRN
  Filled 2011-11-04: qty 222

## 2011-11-04 MED ORDER — AMLODIPINE BESYLATE 10 MG PO TABS
10.0000 mg | ORAL_TABLET | Freq: Every day | ORAL | Status: DC
  Administered 2011-11-05: 10 mg via ORAL
  Filled 2011-11-04 (×5): qty 1

## 2011-11-04 MED ORDER — ONDANSETRON HCL 4 MG/2ML IJ SOLN
4.0000 mg | Freq: Four times a day (QID) | INTRAMUSCULAR | Status: DC | PRN
  Administered 2011-11-04 – 2011-11-05 (×2): 4 mg via INTRAVENOUS
  Filled 2011-11-04 (×2): qty 2

## 2011-11-04 MED ORDER — LEVOFLOXACIN IN D5W 500 MG/100ML IV SOLN
500.0000 mg | INTRAVENOUS | Status: DC
  Filled 2011-11-04: qty 100

## 2011-11-04 MED ORDER — DARBEPOETIN ALFA-POLYSORBATE 25 MCG/0.42ML IJ SOLN
12.5000 ug | INTRAMUSCULAR | Status: DC
  Administered 2011-11-04 – 2011-11-06 (×2): 12.5 ug via INTRAVENOUS
  Filled 2011-11-04: qty 0.42

## 2011-11-04 MED ORDER — DOXAZOSIN MESYLATE 8 MG PO TABS
8.0000 mg | ORAL_TABLET | Freq: Every day | ORAL | Status: DC
  Administered 2011-11-04 – 2011-11-05 (×2): 8 mg via ORAL
  Filled 2011-11-04 (×6): qty 1

## 2011-11-04 NOTE — Progress Notes (Signed)
ANTIBIOTIC CONSULT NOTE - INITIAL  Pharmacy Consult for Vancomycin/Cefepime/Levaquin Indication: rule out pneumonia  No Known Allergies  Patient Measurements: Weight 92 kg (1/12)   Vital Signs: Temp: 100 F (37.8 C) (02/24 2039) Temp src: Oral (02/24 2039) BP: 181/66 mmHg (02/25 0130) Pulse Rate: 76  (02/25 0130)  Labs:  Basename 11/03/11 2232  WBC 8.0  HGB 11.0*  PLT 210  LABCREA --  CREATININE 6.51*   The CrCl is unknown because both a height and weight (above a minimum accepted value) are required for this calculation.  Medical History: Past Medical History  Diagnosis Date  . End stage renal disease on dialysis   . Diabetes mellitus   . Hypertension   . PAD (peripheral artery disease)     Medications:  Norvasc  Zithromax  Hydralazine  Labetalol  Zocor  Assessment: 66 yo female with PVA for empiric antibiotics.  Vancomycin 1g IV in ED at 2 am.   Goal of Therapy:  Vancomycin pre-HD level 15-25  Plan:  Vancomycin 500 mg IV for total of 1500 mg, then 750 mg IV after each HD. Cefepime 1 g IV q24h Levaquin 750 mg IV now, then 500 mg IV q48h.  Eddie Candle 11/04/2011,2:05 AM

## 2011-11-04 NOTE — Consult Note (Signed)
[facility] Renal Consultation Note  Indication for Consultation:  Management of ESRD/hemodialysis; anemia, hypertension/volume and secondary hyperparathyroidism  HPI: Briana Gould is a 66 y.o.AA female with ESRD on chronic HD support every MWF at [facility] in [place], [place]. Last HD on Saturday due to ED visit Thursday night and "felt bad" on Friday. Reports sick off and on but progressively worsened over 2wk's including productive cough with fever 100.2 on Thu 2/21 prompting an ED visit in which she was Dx with Bronchitis and treated with Z-pack prior to discharge. She returned to ED date of admission with chest pain, cough and fever. CXR revealed vascular congestion, mild cardiomegaly, minimal interstitial edema, or mild pneumonia. She was then admitted further evaluation and treatment possible pneumonia. Renal Consult requested ongoing need RRT. Plans for HD today.     Dialysis Orders: Center: [facility] on MWF (1st).  Dr. Lowell Guitar. EDW 85 HD Bath 2K/2.25Ca Time 4hrs Heparin 8,000. Access LUA AVF BFR 400 DFR A2.0  Zemplar 6 mcg IV/HD Epogen 4800 Units IV/HD  Venofer  50mg  qwk (wed) Other UF profile 4; #15 gauge needles; buttonhole  Past Medical History  Diagnosis Date  . End stage renal disease on dialysis   . Diabetes mellitus   . Hypertension   . PAD (peripheral artery disease)    Past Surgical History  Procedure Date  . Av fistula placement   . Below knee leg amputation    History reviewed. No pertinent family history.  reports that she has never smoked. She does not have any smokeless tobacco history on file. She reports that she does not drink alcohol or use illicit drugs. No Known Allergies Prior to Admission medications   Medication Sig Start Date End Date Taking? Authorizing Provider  amLODipine (NORVASC) 10 MG tablet Take 10 mg by mouth daily.   Yes Historical Provider, MD  azithromycin (ZITHROMAX) 250 MG tablet Take 250 mg by mouth daily. Take first 2  tablets together, then 1 every day until finished. 10/31/11 11/05/11 Yes Flint Melter, MD  hydrALAZINE (APRESOLINE) 25 MG tablet Take 37.5 mg by mouth 3 (three) times daily.   Yes Historical Provider, MD  labetalol (NORMODYNE) 200 MG tablet Take 200 mg by mouth 2 (two) times daily. 10/01/11 09/30/12 Yes Luis Abed, MD  simvastatin (ZOCOR) 20 MG tablet Take 20 mg by mouth every evening.   Yes Historical Provider, MD   I have reviewed the patient's current medications. Results for orders placed during the hospital encounter of 11/03/11 (from the past 48 hour(s))  GLUCOSE, CAPILLARY     Status: Abnormal   Collection Time   11/03/11  8:42 PM      Component Value Range Comment   Glucose-Capillary 204 (*) 70 - 99 (mg/dL)    Comment 1 Notify RN      Comment 2 Documented in Chart     CBC     Status: Abnormal   Collection Time   11/03/11 10:32 PM      Component Value Range Comment   WBC 8.0  4.0 - 10.5 (K/uL)    RBC 3.57 (*) 3.87 - 5.11 (MIL/uL)    Hemoglobin 11.0 (*) 12.0 - 15.0 (g/dL)    HCT 65.7 (*) 84.6 - 46.0 (%)    MCV 93.3  78.0 - 100.0 (fL)    MCH 30.8  26.0 - 34.0 (pg)    MCHC 33.0  30.0 - 36.0 (g/dL)    RDW 96.2 (*) 95.2 - 15.5 (%)  Platelets 210  150 - 400 (K/uL)   DIFFERENTIAL     Status: Abnormal   Collection Time   11/03/11 10:32 PM      Component Value Range Comment   Neutrophils Relative 85 (*) 43 - 77 (%)    Lymphocytes Relative 10 (*) 12 - 46 (%)    Monocytes Relative 5  3 - 12 (%)    Eosinophils Relative 0  0 - 5 (%)    Basophils Relative 0  0 - 1 (%)    Neutro Abs 6.8  1.7 - 7.7 (K/uL)    Lymphs Abs 0.8  0.7 - 4.0 (K/uL)    Monocytes Absolute 0.4  0.1 - 1.0 (K/uL)    Eosinophils Absolute 0.0  0.0 - 0.7 (K/uL)    Basophils Absolute 0.0  0.0 - 0.1 (K/uL)    WBC Morphology ATYPICAL LYMPHOCYTES   VACUOLATED NEUTROPHILS  COMPREHENSIVE METABOLIC PANEL     Status: Abnormal   Collection Time   11/03/11 10:32 PM      Component Value Range Comment   Sodium 134 (*) 135  - 145 (mEq/L)    Potassium 4.4  3.5 - 5.1 (mEq/L)    Chloride 91 (*) 96 - 112 (mEq/L)    CO2 23  19 - 32 (mEq/L)    Glucose, Bld 201 (*) 70 - 99 (mg/dL)    BUN 38 (*) 6 - 23 (mg/dL)    Creatinine, Ser 9.60 (*) 0.50 - 1.10 (mg/dL)    Calcium 45.4 (*) 8.4 - 10.5 (mg/dL)    Total Protein 7.9  6.0 - 8.3 (g/dL)    Albumin 3.7  3.5 - 5.2 (g/dL)    AST 51 (*) 0 - 37 (U/L)    ALT 34  0 - 35 (U/L)    Alkaline Phosphatase 87  39 - 117 (U/L)    Total Bilirubin 0.4  0.3 - 1.2 (mg/dL)    GFR calc non Af Amer 6 (*) >90 (mL/min)    GFR calc Af Amer 7 (*) >90 (mL/min)   LIPASE, BLOOD     Status: Normal   Collection Time   11/03/11 10:32 PM      Component Value Range Comment   Lipase 21  11 - 59 (U/L)   CARDIAC PANEL(CRET KIN+CKTOT+MB+TROPI)     Status: Abnormal   Collection Time   11/04/11  2:25 AM      Component Value Range Comment   Total CK 470 (*) 7 - 177 (U/L)    CK, MB 5.4 (*) 0.3 - 4.0 (ng/mL)    Troponin I 0.33 (*) <0.30 (ng/mL)    Relative Index 1.1  0.0 - 2.5    CBC     Status: Abnormal   Collection Time   11/04/11  2:25 AM      Component Value Range Comment   WBC 8.4  4.0 - 10.5 (K/uL)    RBC 3.45 (*) 3.87 - 5.11 (MIL/uL)    Hemoglobin 10.7 (*) 12.0 - 15.0 (g/dL)    HCT 09.8 (*) 11.9 - 46.0 (%)    MCV 91.9  78.0 - 100.0 (fL)    MCH 31.0  26.0 - 34.0 (pg)    MCHC 33.8  30.0 - 36.0 (g/dL)    RDW 14.7 (*) 82.9 - 15.5 (%)    Platelets 186  150 - 400 (K/uL)   CREATININE, SERUM     Status: Abnormal   Collection Time   11/04/11  2:25 AM  Component Value Range Comment   Creatinine, Ser 6.82 (*) 0.50 - 1.10 (mg/dL)    GFR calc non Af Amer 6 (*) >90 (mL/min)    GFR calc Af Amer 7 (*) >90 (mL/min)   MRSA PCR SCREENING     Status: Normal   Collection Time   11/04/11  2:56 AM      Component Value Range Comment   MRSA by PCR NEGATIVE  NEGATIVE    COMPREHENSIVE METABOLIC PANEL     Status: Abnormal   Collection Time   11/04/11  6:05 AM      Component Value Range Comment   Sodium  136  135 - 145 (mEq/L)    Potassium 4.7  3.5 - 5.1 (mEq/L)    Chloride 94 (*) 96 - 112 (mEq/L)    CO2 23  19 - 32 (mEq/L)    Glucose, Bld 229 (*) 70 - 99 (mg/dL)    BUN 47 (*) 6 - 23 (mg/dL)    Creatinine, Ser 4.54 (*) 0.50 - 1.10 (mg/dL)    Calcium 09.8  8.4 - 10.5 (mg/dL)    Total Protein 7.4  6.0 - 8.3 (g/dL)    Albumin 3.5  3.5 - 5.2 (g/dL)    AST 47 (*) 0 - 37 (U/L)    ALT 34  0 - 35 (U/L)    Alkaline Phosphatase 85  39 - 117 (U/L)    Total Bilirubin 0.5  0.3 - 1.2 (mg/dL)    GFR calc non Af Amer 5 (*) >90 (mL/min)    GFR calc Af Amer 6 (*) >90 (mL/min)   CBC     Status: Abnormal   Collection Time   11/04/11  6:05 AM      Component Value Range Comment   WBC 9.5  4.0 - 10.5 (K/uL)    RBC 3.49 (*) 3.87 - 5.11 (MIL/uL)    Hemoglobin 10.8 (*) 12.0 - 15.0 (g/dL)    HCT 11.9 (*) 14.7 - 46.0 (%)    MCV 92.6  78.0 - 100.0 (fL)    MCH 30.9  26.0 - 34.0 (pg)    MCHC 33.4  30.0 - 36.0 (g/dL)    RDW 82.9 (*) 56.2 - 15.5 (%)    Platelets 195  150 - 400 (K/uL)   PHOSPHORUS     Status: Abnormal   Collection Time   11/04/11  6:05 AM      Component Value Range Comment   Phosphorus 6.7 (*) 2.3 - 4.6 (mg/dL)   GLUCOSE, CAPILLARY     Status: Abnormal   Collection Time   11/04/11  8:09 AM      Component Value Range Comment   Glucose-Capillary 226 (*) 70 - 99 (mg/dL)    EKG: on telemetry  ROS: positive fever (100.2; last on Thu); prod cough; N,V, headache, dizziness, lose stools but neg diarrhea, pleural pain; otherwise negative   Physical Exam: Filed Vitals:   11/04/11 0550  BP: 217/76  Pulse: 74  Temp: 98.7 F (37.1 C)  Resp: 18     General: uncomfortable , but in no acute distress HEENT: atraumatic, normocephalic Eyes: PERRL, sclera nonicteric Neck: supple Heart: RRR; 2-3/6 SEM Lungs: diminished bases, prolonged expiratory phase Abdomen: obese, soft, slight tenderness, ND, + BS  Extremities: 1+ edema RLE; Left BKA Skin: intact Neuro: alert, OX3, pleasant Dialysis  Access: LUA AVF, +T/B  Assessment/Plan: 1. Pneumonia- on IV Levaquin, Vanc and cefipime 2. Chest pain- pleuriticl; cyclic enzymes pend; history nonobstructive CAD followed by Dr.  Myrtis Ser outpt 2. ESRD -  MWF [place]; HD today; re-evaluate during HD and (BP and fluid removal) 3. Hypertension/PMH Nonobstructive CAD, NSTEMI, volume  - history pulmonary HTN, medical noncompliance; BP currently 217/76 despite am meds (Hydralazine and Labetalol); Max UF with HD; freq cramps with tx and difficult fluid removal; will probably need new eDW. 4. Medical Noncompliance- reports previously scheduled to take Hydralazine 37.5 mg TID but frequently missing 3rd dose and with last visit Dr. Myrtis Ser, changed Hydralazine 50mg  BID; outpt med list BID on HD days and TID on non-HD days 5. Anemia  - Hgb 10.8; same outpt epogen (4800)); give Aranesp 12.21mcg wk and follow; same wkly iron 6. Metabolic bone disease -  Tums Ultra as binders outpt; phosp 6.7 and Ca 10.5 (corrected ca 10.9); on Zemplar ; cont 2.25 ca bath and change non-ca binder (Renagel 800mg - 2 w/meals and titrate if necessary); follow closely 7. DM/HLD/PVD- on statin therapy, daily ASA, CBG and SSI; defer to primary 8. Nutrition - albumin 3.5; appetite poor; ^ protein diet; Nepro suppl 9. Disposition- per primary services  Addendum: home meds reviewed with patient and as noted #3 and #4; doesn't include ASA 325mg , Nephrovite, Tums Ultra 1 w/meals/snacks, Doxazosin 8mg , Imdur 30mg , Glimepiride 4mg , Xanax 0.25mg  qhs (prn cramps), and Lisinopril 20mg  qhs in which she admits (supposed to be taking); will update list; hold prn xanax for now  Samuel Germany, [facility] Pager [phone] 11/04/2011, 8:41 AM   Patient seen and examined and agree with assessment and plan as above.  ESRD pt with DM, HTN and L BKA admitted with refractory cough, chills and subjective fever with pleuritic CP, receiving IV abx for presumed PNA.  No respiratory  distress, due for dialysis today.  Recommendations as above. Vinson Moselle  MD [employer] [phone] pgr    [phone] cell 11/04/2011, 6:46 PM

## 2011-11-04 NOTE — H&P (Signed)
Briana Gould is an 66 y.o. female.  PCP - Dr.Preston Chestine Spore.  Chief Complaint: Cough fever and chest pain. HPI: 66 year-old female with history of ESRD on hemodialysis on Monday Wednesday and Friday, history of hypertension and diabetes mellitus type 2 presented to the ER because of ongoing chest pain with cough fever and chills over the last week and a half. Patient had originally come 4 days ago to the ER with similar complaints and at that time patient was discharged home on Zithromax. Despite taking Zithromax patient still has cough with subjective feeling of fever chills and persistent chest pain. The chest pain is retrosternal and it is usually associated cough and deep breath. Patient denies any palpitation nausea or diaphoresis or any exertional symptoms. Patient denies missing any dialysis. Patient denies any nausea vomiting abdominal pain dysuria discharges or diarrhea. Patient was found to be febrile in the ER and chest x-ray was showing congestion. At this time patient is admitted for fever most likely from pneumonia.  Past Medical History  Diagnosis Date  . End stage renal disease on dialysis   . Diabetes mellitus   . Hypertension   . PAD (peripheral artery disease)     Past Surgical History  Procedure Date  . Av fistula placement   . Below knee leg amputation     History reviewed. No pertinent family history. Social History:  reports that she has never smoked. She does not have any smokeless tobacco history on file. She reports that she does not drink alcohol or use illicit drugs.  Allergies: No Known Allergies  Medications Prior to Admission  Medication Dose Route Frequency Provider Last Rate Last Dose  . 0.9 %  sodium chloride infusion   Intravenous Once Toy Baker, MD 125 mL/hr at 11/04/11 0052    . ondansetron (ZOFRAN) injection 4 mg  4 mg Intravenous Once Toy Baker, MD   4 mg at 11/04/11 0053  . piperacillin-tazobactam (ZOSYN) IVPB 3.375 g  3.375 g  Intravenous Once Toy Baker, MD   3.375 g at 11/04/11 0053  . vancomycin (VANCOCIN) IVPB 1000 mg/200 mL premix  1,000 mg Intravenous Once Toy Baker, MD       Medications Prior to Admission  Medication Sig Dispense Refill  . amLODipine (NORVASC) 10 MG tablet Take 10 mg by mouth daily.      Marland Kitchen azithromycin (ZITHROMAX) 250 MG tablet Take 250 mg by mouth daily. Take first 2 tablets together, then 1 every day until finished.      . hydrALAZINE (APRESOLINE) 25 MG tablet Take 37.5 mg by mouth 3 (three) times daily.      Marland Kitchen labetalol (NORMODYNE) 200 MG tablet Take 200 mg by mouth 2 (two) times daily.      . simvastatin (ZOCOR) 20 MG tablet Take 20 mg by mouth every evening.        Results for orders placed during the hospital encounter of 11/03/11 (from the past 48 hour(s))  GLUCOSE, CAPILLARY     Status: Abnormal   Collection Time   11/03/11  8:42 PM      Component Value Range Comment   Glucose-Capillary 204 (*) 70 - 99 (mg/dL)    Comment 1 Notify RN      Comment 2 Documented in Chart     CBC     Status: Abnormal   Collection Time   11/03/11 10:32 PM      Component Value Range Comment   WBC 8.0  4.0 -  10.5 (K/uL)    RBC 3.57 (*) 3.87 - 5.11 (MIL/uL)    Hemoglobin 11.0 (*) 12.0 - 15.0 (g/dL)    HCT 96.2 (*) 95.2 - 46.0 (%)    MCV 93.3  78.0 - 100.0 (fL)    MCH 30.8  26.0 - 34.0 (pg)    MCHC 33.0  30.0 - 36.0 (g/dL)    RDW 84.1 (*) 32.4 - 15.5 (%)    Platelets 210  150 - 400 (K/uL)   DIFFERENTIAL     Status: Abnormal   Collection Time   11/03/11 10:32 PM      Component Value Range Comment   Neutrophils Relative 85 (*) 43 - 77 (%)    Lymphocytes Relative 10 (*) 12 - 46 (%)    Monocytes Relative 5  3 - 12 (%)    Eosinophils Relative 0  0 - 5 (%)    Basophils Relative 0  0 - 1 (%)    Neutro Abs 6.8  1.7 - 7.7 (K/uL)    Lymphs Abs 0.8  0.7 - 4.0 (K/uL)    Monocytes Absolute 0.4  0.1 - 1.0 (K/uL)    Eosinophils Absolute 0.0  0.0 - 0.7 (K/uL)    Basophils Absolute 0.0  0.0 - 0.1  (K/uL)    WBC Morphology ATYPICAL LYMPHOCYTES   VACUOLATED NEUTROPHILS  COMPREHENSIVE METABOLIC PANEL     Status: Abnormal   Collection Time   11/03/11 10:32 PM      Component Value Range Comment   Sodium 134 (*) 135 - 145 (mEq/L)    Potassium 4.4  3.5 - 5.1 (mEq/L)    Chloride 91 (*) 96 - 112 (mEq/L)    CO2 23  19 - 32 (mEq/L)    Glucose, Bld 201 (*) 70 - 99 (mg/dL)    BUN 38 (*) 6 - 23 (mg/dL)    Creatinine, Ser 4.01 (*) 0.50 - 1.10 (mg/dL)    Calcium 02.7 (*) 8.4 - 10.5 (mg/dL)    Total Protein 7.9  6.0 - 8.3 (g/dL)    Albumin 3.7  3.5 - 5.2 (g/dL)    AST 51 (*) 0 - 37 (U/L)    ALT 34  0 - 35 (U/L)    Alkaline Phosphatase 87  39 - 117 (U/L)    Total Bilirubin 0.4  0.3 - 1.2 (mg/dL)    GFR calc non Af Amer 6 (*) >90 (mL/min)    GFR calc Af Amer 7 (*) >90 (mL/min)   LIPASE, BLOOD     Status: Normal   Collection Time   11/03/11 10:32 PM      Component Value Range Comment   Lipase 21  11 - 59 (U/L)    Dg Chest 2 View  11/03/2011  *RADIOLOGY REPORT*  Clinical Data: Cough and fever; hypertension.  CHEST - 2 VIEW  Comparison: Chest radiograph performed 10/31/2011  Findings: The lungs are well-aerated.  Vascular congestion is noted.  There are mildly increased interstitial markings; this could reflect minimal interstitial edema, or given clinical concern, mild pneumonia could conceivably have a similar appearance.  There is no evidence of pleural effusion or pneumothorax.  The heart is mildly enlarged, stable from the prior study; hilar prominence remains stable from prior studies.  No acute osseous abnormalities are seen.  IMPRESSION: Vascular congestion and mild cardiomegaly noted; mildly increased interstitial markings could reflect minimal interstitial edema, or given clinical concern, mild pneumonia could conceivably have a similar appearance.  Original Report Authenticated By: Tonia Ghent, M.D.  Review of Systems  Constitutional: Positive for fever.  Eyes: Negative.     Respiratory: Positive for cough and sputum production.   Cardiovascular: Positive for chest pain.  Gastrointestinal: Negative.   Genitourinary: Negative.   Musculoskeletal: Negative.   Skin: Negative.   Neurological: Negative.   Endo/Heme/Allergies: Negative.   Psychiatric/Behavioral: Negative.     Blood pressure 181/66, pulse 76, temperature 100 F (37.8 C), temperature source Oral, resp. rate 28, SpO2 99.00%. Physical Exam  Constitutional: She is oriented to person, place, and time. She appears well-developed and well-nourished. No distress.  HENT:  Head: Normocephalic and atraumatic.  Right Ear: External ear normal.  Left Ear: External ear normal.  Nose: Nose normal.  Mouth/Throat: Oropharynx is clear and moist. No oropharyngeal exudate.  Eyes: Conjunctivae are normal. Pupils are equal, round, and reactive to light. Right eye exhibits no discharge. Left eye exhibits no discharge. No scleral icterus.  Neck: Normal range of motion. Neck supple.  Cardiovascular: Normal rate and regular rhythm.   Respiratory: Effort normal and breath sounds normal. No respiratory distress. She has no wheezes. She has no rales.  GI: Soft. Bowel sounds are normal. She exhibits no distension. There is no tenderness. There is no rebound.  Musculoskeletal: Normal range of motion. She exhibits no edema and no tenderness.  Neurological: She is alert and oriented to person, place, and time.       Moves upper and lower extremities 5/5.  Skin: Skin is dry. No rash noted. She is not diaphoretic. No erythema.  Psychiatric: Her behavior is normal.     Assessment/Plan #1. Pneumonia - this time patient's clinical presentation looks compatible with pneumonia. Chest x-rays not showing definite infiltrates though. Patient has been started vancomycin and Zosyn. We will continue vancomycin cefepime and Levaquin to treat for healthcare associated pneumonia. We'll also check flu PCR. #2. Chest pain, pleuritic -  patient's EKG shows left bundle branch block with some T-wave inversions. I did discuss with on-call cardiologist Dr.Munoz. There are some T-wave inversion in comparison to EKG done 4 days ago and as per cardiologist these are nonspecific and the LVDP is comparable to the old EKG. At this time patient's chest pain most likely from pneumonic process we will check cardiac enzymes. #3. ESRD on hemodialysis on Monday Wednesday and Fridays - have left a message for dialysis. Patient is not in acute distress at this time. Does not have hyperkalemia. #4. Hypertension uncontrolled - continue home medications and I have also place patient on when necessary labetalol for systolic blood pressure more than 160. #5. Diabetes mellitus type 2 - on CBG with sliding scale coverage.  #6. 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  CODE STATUS - full code.  Rhyder Bratz N. 11/04/2011, 1:51 AM

## 2011-11-04 NOTE — Progress Notes (Signed)
Patient off unit

## 2011-11-04 NOTE — Progress Notes (Signed)
[name], female   DOB: Jul 19, 1946, 66 y.o.   MRN: [id number]  Subjective:  No events overnight.   Objective:  Vital signs in last 24 hours:  Filed Vitals:   11/04/11 1300 11/04/11 1324 11/04/11 1330 11/04/11 1339  BP: 135/59 157/88 168/71 188/79  Pulse: 70 70 70 70  Resp: 21 13 19 20   SpO2: 94% 88% 93% 100%    Intake/Output from previous day:   Intake/Output Summary (Last 24 hours) at 11/04/11 1717 Last data filed at 11/04/11 1339  Gross per 24 hour  Intake    360 ml  Output   4843 ml  Net  -4483 ml    Physical Exam: General: Alert, awake, oriented x3, in no acute distress. HEENT: No bruits, no goiter. Moist mucous membranes, no scleral icterus, no conjunctival pallor. Heart: Regular rate and rhythm, S1/S2 +, no murmurs, rubs, gallops. Lungs: coarse breath sounds, (+) wheezing  Abdomen: Soft, nontender, nondistended, positive bowel sounds. Extremities: right BKA Neuro: Grossly nonfocal.  Lab Results:  Lab 11/04/11 0605 11/04/11 0225 2011/11/27 2232 10/31/11 2113  WBC 9.5 8.4 8.0 7.6  HGB 10.8* 10.7* 11.0* 10.5*  HCT 32.3* 31.7* 33.3* 32.2*  PLT 195 186 210 222  MCV 92.6 91.9 93.3 93.6    Lab 11/04/11 0605 11-27-2011 2232 10/31/11 2113  NA 136 134* 135  K 4.7 4.4 4.2  CL 94* 91* 94*  CO2 23 23 28   GLUCOSE 229* 201* 139*  BUN 47* 38* 29*  CREATININE 7.02* 6.51* 6.16*  CALCIUM 10.5 10.9* 10.8*   No results found for this basename: INR:5,PROTIME:5 in the last 168 hours Cardiac markers:  Lab 11/04/11 0840 11/04/11 0225 10/31/11 2113  CKMB 6.4* 5.4* --  TROPONINI 0.48* 0.33* <0.30  MYOGLOBIN -- -- --     Studies/Results: Dg Chest 2 View Nov 27, 2011   IMPRESSION: Vascular congestion and mild cardiomegaly noted; mildly increased interstitial markings could reflect minimal interstitial edema, or given clinical concern, mild pneumonia could conceivably have a similar appearance.   Medications: Scheduled Meds:   . amLODipine  10 mg Oral  QHS  . aspirin EC  325 mg Oral Daily  . ceFEPime (MAXIPIME) IV  1 g Intravenous QHS  . darbepoetin (ARANESP) injection - DIALYSIS  12.5 mcg Intravenous Q Mon-HD  . doxazosin  8 mg Oral QHS  . enoxaparin  30 mg Subcutaneous Daily  . ferric gluconate (FERRLECIT/NULECIT) IV  62.5 mg Intravenous Q Wed-HD  . hydrALAZINE  37.5 mg Oral Q8H  . insulin aspart  0-9 Units Subcutaneous TID WC  . isosorbide mononitrate  30 mg Oral Daily  . labetalol  400 mg Oral BID  . levofloxacin (LEVAQUIN) IV  500 mg Intravenous Q48H  . lisinopril  20 mg Oral QHS  . multivitamin  1 tablet Oral QHS  . paricalcitol  6 mcg Intravenous Q M,W,F-HD  . sevelamer  1,600 mg Oral TID WC  . simvastatin  20 mg Oral QPM  . vancomycin  750 mg Intravenous Q M,W,F-HD   Assessment/Plan:  Principal Problem:   *Pneumonia - continue IV antibiotics with Vancomycin, Levaquin and Cefepime - follow up results of blood cultures - it is important to rule out pulmonary embolism as patient has an episode of hypoxia and is obese with complaints of shortness of breath; ordered NM perfusion scan - nebulizer treatments as needed for wheezing and shortness of breath  Active Problems:   DM - sliding scale insulin - CBG monitoring  Slight Elevation in Troponin -  likely demand ischemia - we will cycle cardiac enzymes - no complains of chest pain   HYPERTENSION - likely secondary to non compliance/ fluid overload - uncontrolled with multiple medications Norvasc, hydralazine, Imdur, labetalol and lisinopril - we will reassess after HD BP and make necessary adjustments to improve BP control   ESRD (end stage renal disease) on dialysis - as per renal schedule   EDUCATION - test results and diagnostic studies were discussed with patient  at the bedside - patient has verbalized the understanding - questions were answered at the bedside and contact information was provided for additional questions or concerns   LOS: 1 day    Briana Gould 11/04/2011, 5:17 PM  [facility] Pager: [phone]

## 2011-11-04 NOTE — Progress Notes (Signed)
PT Cancellation Note  Treatment cancelled today due to patient receiving procedure or test. Pt in hemodialysis, will try again later schedule permitting.   Briana Gould 11/04/2011, 5:31 PM [name] L. [place] DPT [phone]

## 2011-11-04 NOTE — Progress Notes (Signed)
Page to  Md to report increased BP of 217/76, awaiting response. Pt asymptomatic and states her pressures always run high. Given oral BP meds and report given to Kami, RN to recheck for efficiency.

## 2011-11-04 NOTE — Progress Notes (Signed)
Utilization review complete 

## 2011-11-04 NOTE — Progress Notes (Signed)
Page to Daphane Shepherd to report Troponin 0.33. Notified of result and confirmed by phone.

## 2011-11-05 ENCOUNTER — Inpatient Hospital Stay (HOSPITAL_COMMUNITY): Payer: BC Managed Care – PPO

## 2011-11-05 LAB — BASIC METABOLIC PANEL
GFR calc Af Amer: 10 mL/min — ABNORMAL LOW (ref >90)
GFR calc non Af Amer: 8 mL/min — ABNORMAL LOW (ref >90)
Potassium: 3.9 mEq/L (ref 3.5–5.1)
Sodium: 135 mEq/L (ref 135–145)

## 2011-11-05 LAB — GLUCOSE, CAPILLARY
Glucose-Capillary: 133 mg/dL — ABNORMAL HIGH (ref 70–99)
Glucose-Capillary: 130 mg/dL — ABNORMAL HIGH (ref 70–99)
Glucose-Capillary: 158 mg/dL — ABNORMAL HIGH (ref 70–99)

## 2011-11-05 LAB — CBC
Hemoglobin: 11 g/dL — ABNORMAL LOW (ref 12.0–15.0)
MCHC: 32.9 g/dL (ref 30.0–36.0)
Platelets: 214 10*3/uL (ref 150–400)
RDW: 15.7 % — ABNORMAL HIGH (ref 11.5–15.5)

## 2011-11-05 MED ORDER — WHITE PETROLATUM GEL
Status: AC
  Filled 2011-11-05: qty 5

## 2011-11-05 MED ORDER — HEPARIN SODIUM (PORCINE) 1000 UNIT/ML DIALYSIS
20.0000 [IU]/kg | INTRAMUSCULAR | Status: DC | PRN
  Filled 2011-11-05: qty 2

## 2011-11-05 MED ORDER — LEVOFLOXACIN IN D5W 500 MG/100ML IV SOLN
500.0000 mg | INTRAVENOUS | Status: DC
  Administered 2011-11-06: 500 mg via INTRAVENOUS
  Filled 2011-11-05 (×2): qty 100

## 2011-11-05 MED ORDER — TECHNETIUM TO 99M ALBUMIN AGGREGATED
3.0000 | Freq: Once | INTRAVENOUS | Status: AC | PRN
  Administered 2011-11-05: 3 via INTRAVENOUS

## 2011-11-05 NOTE — Progress Notes (Signed)
Pt family at bedside. Requested vitals for pt be written down so she could take them to her daughter. Informed of HIPAA regulations, and pt would never give verbal consent for this individual to be given any information.

## 2011-11-05 NOTE — Progress Notes (Signed)
Pt declined Tylenol PO. Did not need at present.

## 2011-11-05 NOTE — Progress Notes (Signed)
[facility] Progress Note Subjective:  Still coughing. Denies SOB  Objective Filed Vitals:   11/04/11 2136 11/05/11 0519 11/05/11 0911 11/05/11 0940  BP: 178/79 166/82 150/119 152/80  Pulse: 70 69 65   Temp: 99.3 F (37.4 C) 99 F (37.2 C)    TempSrc: Oral Oral Oral   Resp: 20 18 20    Height:      Weight: 78.3 kg (172 lb 9.9 oz)     SpO2: 100% 100% 99%    Physical Exam: General: NAD, appears to feel badly Heart: RRR Lungs:  Coarse BS thoughout with a few insp. " squeaks" Abdomen: soft NT Extremities: no sign. Left BKA edema; tr right LE edema Dialysis Access: LUA AVF, +T/B  Problem/Plan: 1. PNA - on IV Levaquin, Vanc and cefipime 2. Chest pain- pleuritic; cyclic enzymes pend; history nonobstructive CAD followed by Dr. Myrtis Ser outpt  3. ESRD - MWF [employer]; HD tomorrow; re-evaluate during HD and (BP and fluid removal); EDW 85 below that ??pre HD wt was below that Net UF 4.6 4. Hypertension/PMH Nonobstructive CAD, NSTEMI, volume - history pulmonary HTN, medical noncompliance; BP is better on Hydralazine,  Labetalol, Cardura and amlodipine. req cramps with tx and difficult fluid removal; will probably need new eDW; challenge volume as able; BP still high  5. Medical Noncompliance- reports previously scheduled to take Hydralazine 37.5 mg TID but frequently missing 3rd dose and with last visit Dr. Myrtis Ser, changed Hydralazine 50mg  BID; outpt med list BID on HD days and TID on non-HD days  6. Anemia - Hgb stable; same outpt epogen (4800)); give Aranesp 12.85mcg wk and follow; same wkly iron  7. Metabolic bone disease - Tums Ultra as binders outpt; phosp 6.7 and Ca 10.5 (corrected ca 10.9); on Zemplar ; cont 2.25 ca bath and change non-ca binder (Renagel 800mg - 2 w/meals and titrate if necessary); follow closely  8. DM/HLD/PVD- on statin therapy, daily ASA, CBG and SSI; defer to primary  9. Nutrition - albumin 3.5; appetite poor; ^ protein diet; Nepro suppl Additional  Objective Labs: Basic Metabolic Panel:  Lab 11/05/11 4098 11/04/11 0605 11/04/11 0225 11/03/11 2232  NA 135 136 -- 134*  K 3.9 4.7 -- 4.4  CL 94* 94* -- 91*  CO2 26 23 -- 23  GLUCOSE 121* 229* -- 201*  BUN 32* 47* -- 38*  CREATININE 4.93* 7.02* 6.82* --  CALCIUM 10.0 10.5 -- 10.9*  ALB -- -- -- --  PHOS -- 6.7* -- --   Liver Function Tests:  Lab 11/04/11 0605 11/03/11 2232  AST 47* 51*  ALT 34 34  ALKPHOS 85 87  BILITOT 0.5 0.4  PROT 7.4 7.9  ALBUMIN 3.5 3.7   CBC:  Lab 11/05/11 0545 11/04/11 0605 11/04/11 0225 11/03/11 2232 10/31/11 2113  WBC 8.9 9.5 8.4 -- --  NEUTROABS -- -- -- 6.8 --  HGB 11.0* 10.8* 10.7* -- --  HCT 33.4* 32.3* 31.7* -- --  MCV 93.0 92.6 91.9 93.3 93.6  PLT 214 195 186 -- --  Cardiac Enzymes:  Lab 11/04/11 1736 11/04/11 0840 11/04/11 0225 10/31/11 2113  CKTOTAL 362* 476* 470* --  CKMB 5.8* 6.4* 5.4* --  CKMBINDEX -- -- -- --  TROPONINI 0.50* 0.48* 0.33* <0.30   CBG:  Lab 11/05/11 1244 11/05/11 0740 11/04/11 2133 11/04/11 1705 11/04/11 1454  GLUCAP 136* 133* 108* 149* 130*  Medications:      . amLODipine  10 mg Oral QHS  . aspirin EC  325 mg Oral Daily  .  aspirin EC  325 mg Oral Once  . ceFEPime (MAXIPIME) IV  1 g Intravenous QHS  . darbepoetin (ARANESP) injection - DIALYSIS  12.5 mcg Intravenous Q Mon-HD  . doxazosin  8 mg Oral QHS  . enoxaparin  30 mg Subcutaneous Daily  . ferric gluconate (FERRLECIT/NULECIT) IV  62.5 mg Intravenous Q Wed-HD  . hydrALAZINE  50 mg Oral Q8H  . insulin aspart  0-9 Units Subcutaneous TID WC  . isosorbide mononitrate  30 mg Oral Daily  . labetalol  400 mg Oral BID  . levofloxacin (LEVAQUIN) IV  500 mg Intravenous Q48H  . levofloxacin (LEVAQUIN) IV  750 mg Intravenous Once  . lisinopril  20 mg Oral QHS  . multivitamin  1 tablet Oral QHS  . paricalcitol  6 mcg Intravenous Q M,W,F-HD  . sevelamer  1,600 mg Oral TID WC  . simvastatin  20 mg Oral QPM  . sodium chloride  3 mL Intravenous Q12H  .  sodium chloride  3 mL Intravenous Q12H  . vancomycin  750 mg Intravenous Q M,W,F-HD  . DISCONTD: hydrALAZINE  37.5 mg Oral Q8H  . DISCONTD: levofloxacin (LEVAQUIN) IV  500 mg Intravenous Q48H    I  have reviewed scheduled and prn medications.  Sheffield Slider, PA-C [facility] Beeper [phone]  11/05/2011,12:50 PM  LOS: 2 days   Patient seen and examined and agree with assessment and plan as above.   Vinson Moselle  MD [employer] [phone] pgr    [phone] cell 11/05/2011, 4:19 PM

## 2011-11-05 NOTE — Progress Notes (Signed)
[name], female   DOB: December 24, 1945, 66 y.o.   MRN: [id number]  Assessment/Plan:   Principal Problem:   *Pneumonia  - continue IV antibiotics with Vancomycin, Levaquin   - NM V/Q scan showed low probability for PE - nebulizer treatments as needed for wheezing and shortness of breath   Active Problems:   DM  - sliding scale insulin  - CBG monitoring   Slight Elevation in Troponin  - likely demand ischemia  - no complains of chest pain   HYPERTENSION  - likely secondary to non compliance/ fluid overload  - uncontrolled with multiple medications Norvasc, hydralazine, Imdur, labetalol and lisinopril  - we will reassess after HD BP and make necessary adjustments to improve BP control   ESRD (end stage renal disease) on dialysis  - as per renal schedule   EDUCATION  - test results and diagnostic studies were discussed with patient at the bedside  - patient has verbalized the understanding  - questions were answered at the bedside and contact information was provided for additional questions or concerns     Subjective: No events overnight. Patient denies chest pain, shortness of breath, abdominal pain.   Objective:  Vital signs in last 24 hours:  Filed Vitals:   11/05/11 0940 11/05/11 1400 11/05/11 1700 11/05/11 2147  BP: 152/80 158/44 191/74 159/65  Pulse:  65 66 69  Temp:  98 F (36.7 C) 98.7 F (37.1 C) 98 F (36.7 C)  TempSrc:  Oral Oral Oral  Resp:  18 20 19   Height:    5\' 6"  (1.676 m)  Weight:    80.1 kg (176 lb 9.4 oz)  SpO2:  100% 95% 93%    Intake/Output from previous day:   Intake/Output Summary (Last 24 hours) at 11/05/11 2158 Last data filed at 11/05/11 1831  Gross per 24 hour  Intake    120 ml  Output      0 ml  Net    120 ml    Physical Exam: General: Alert, awake, oriented x3, in no acute distress. HEENT: No bruits, no goiter. Moist mucous membranes, no scleral icterus, no conjunctival pallor. Heart: Regular rate and  rhythm, S1/S2 +, no murmurs, rubs, gallops. Lungs: somewhat diminished breath sounds bilaterally Abdomen: Soft, nontender, nondistended, positive bowel sounds. Extremities: BKA, left Neuro: Grossly nonfocal.  Lab Results:  Basic Metabolic Panel:    Component Value Date/Time   NA 135 11/05/2011 0545   K 3.9 11/05/2011 0545   CL 94* 11/05/2011 0545   CO2 26 11/05/2011 0545   BUN 32* 11/05/2011 0545   CREATININE 4.93* 11/05/2011 0545   GLUCOSE 121* 11/05/2011 0545   CALCIUM 10.0 11/05/2011 0545   CBC:    Component Value Date/Time   WBC 8.9 11/05/2011 0545   HGB 11.0* 11/05/2011 0545   HCT 33.4* 11/05/2011 0545   PLT 214 11/05/2011 0545   MCV 93.0 11/05/2011 0545   NEUTROABS 6.8 11/03/2011 2232   LYMPHSABS 0.8 11/03/2011 2232   MONOABS 0.4 11/03/2011 2232   EOSABS 0.0 11/03/2011 2232   BASOSABS 0.0 11/03/2011 2232      Lab 11/05/11 0545 11/04/11 0605 11/04/11 0225 11/03/11 2232 10/31/11 2113  WBC 8.9 9.5 8.4 8.0 7.6  HGB 11.0* 10.8* 10.7* 11.0* 10.5*  HCT 33.4* 32.3* 31.7* 33.3* 32.2*  PLT 214 195 186 210 222  MCV 93.0 92.6 91.9 93.3 93.6  MCH 30.6 30.9 31.0 30.8 30.5  MCHC 32.9 33.4 33.8 33.0 32.6  RDW 15.7* 15.8* 15.7*  15.7* 15.9*  LYMPHSABS -- -- -- 0.8 --  MONOABS -- -- -- 0.4 --  EOSABS -- -- -- 0.0 --  BASOSABS -- -- -- 0.0 --  BANDABS -- -- -- -- --    Lab 11/05/11 0545 11/04/11 0605 11/04/11 0225 11/03/11 2232 10/31/11 2113  NA 135 136 -- 134* 135  K 3.9 4.7 -- 4.4 4.2  CL 94* 94* -- 91* 94*  CO2 26 23 -- 23 28  GLUCOSE 121* 229* -- 201* 139*  BUN 32* 47* -- 38* 29*  CREATININE 4.93* 7.02* 6.82* 6.51* 6.16*  CALCIUM 10.0 10.5 -- 10.9* 10.8*  MG -- -- -- -- --   No results found for this basename: INR:5,PROTIME:5 in the last 168 hours Cardiac markers:  Lab 11/04/11 1736 11/04/11 0840 11/04/11 0225  CKMB 5.8* 6.4* 5.4*  TROPONINI 0.50* 0.48* 0.33*  MYOGLOBIN -- -- --   No components found with this basename: POCBNP:3 Recent Results (from the past 240 hour(s))    MRSA PCR SCREENING     Status: Normal   Collection Time   11/04/11  2:56 AM      Component Value Range Status Comment   MRSA by PCR NEGATIVE  NEGATIVE  Final     Studies/Results: Dg Chest 2 View  11/03/2011  *RADIOLOGY REPORT*  Clinical Data: Cough and fever; hypertension.  CHEST - 2 VIEW  Comparison: Chest radiograph performed 10/31/2011  Findings: The lungs are well-aerated.  Vascular congestion is noted.  There are mildly increased interstitial markings; this could reflect minimal interstitial edema, or given clinical concern, mild pneumonia could conceivably have a similar appearance.  There is no evidence of pleural effusion or pneumothorax.  The heart is mildly enlarged, stable from the prior study; hilar prominence remains stable from prior studies.  No acute osseous abnormalities are seen.  IMPRESSION: Vascular congestion and mild cardiomegaly noted; mildly increased interstitial markings could reflect minimal interstitial edema, or given clinical concern, mild pneumonia could conceivably have a similar appearance.  Original Report Authenticated By: Tonia Ghent, M.D.   [facility] Pulmonary Perfusion  11/05/2011  *RADIOLOGY REPORT*  Clinical Data:  Respiratory distress.  NUCLEAR MEDICINE VENTILATION - PERFUSION LUNG SCAN  Technique:  Wash-in, equilibrium, and wash-out phase ventilation images were obtained using Xe-133 gas.  Perfusion images were obtained in multiple projections after intravenous injection of Tc- 23m MAA.  Radiopharmaceuticals:  1.0 mCi Tc-38m MAA.  Comparison:  Ventilation perfusion scan 08/30/2010.  Plain films of the chest 11/03/2011.  Findings: Perfusion imaging demonstrates somewhat patchy radiotracer distribution but no segmental or subsegmental defect is identified.  IMPRESSION: Low probability for pulmonary embolus.  Original Report Authenticated By: Bernadene Bell. Maricela Curet, M.D.    Medications: Scheduled Meds:   . amLODipine  10 mg Oral QHS  . aspirin EC  325 mg Oral Daily   . ceFEPime (MAXIPIME) IV  1 g Intravenous QHS  . darbepoetin (ARANESP) injection - DIALYSIS  12.5 mcg Intravenous Q Mon-HD  . doxazosin  8 mg Oral QHS  . enoxaparin  30 mg Subcutaneous Daily  . ferric gluconate (FERRLECIT/NULECIT) IV  62.5 mg Intravenous Q Wed-HD  . hydrALAZINE  50 mg Oral Q8H  . insulin aspart  0-9 Units Subcutaneous TID WC  . isosorbide mononitrate  30 mg Oral Daily  . labetalol  400 mg Oral BID  . levofloxacin (LEVAQUIN) IV  500 mg Intravenous Q48H  . lisinopril  20 mg Oral QHS  . multivitamin  1 tablet Oral QHS  . paricalcitol  6 mcg Intravenous Q M,W,F-HD  . sevelamer  1,600 mg Oral TID WC  . simvastatin  20 mg Oral QPM  . sodium chloride  3 mL Intravenous Q12H  . vancomycin  750 mg Intravenous Q M,W,F-HD  . white petrolatum      . DISCONTD: hydrALAZINE  37.5 mg Oral Q8H  . DISCONTD: levofloxacin (LEVAQUIN) IV  500 mg Intravenous Q48H  . DISCONTD: sodium chloride  3 mL Intravenous Q12H     LOS: 2 days   Donnovan Stamour 11/05/2011, 9:58 PM  [facility] Pager: [phone]

## 2011-11-05 NOTE — Evaluation (Signed)
Physical Therapy Evaluation Patient Details Name: Briana Gould MRN: [id number] DOB: 1946-04-07 Today's Date: 11/05/2011  Problem List:  Patient Active Problem List  Diagnoses  . DM  . HYPERLIPIDEMIA  . OBESITY  . HYPERTENSION  . CORONARY ATHEROSCLEROSIS NATIVE CORONARY ARTERY  . PULMONARY HYPERTENSION  . RENAL DISEASE, CHRONIC  . SYNCOPE  . BKA, LEFT LEG  . MITRAL REGURGITATION  . AORTIC STENOSIS  . CAROTID BRUITS, BILATERAL  . Pneumonia  . ESRD (end stage renal disease) on dialysis    Past Medical History:  Past Medical History  Diagnosis Date  . End stage renal disease on dialysis   . Diabetes mellitus   . Hypertension   . PAD (peripheral artery disease)    Past Surgical History:  Past Surgical History  Procedure Date  . Av fistula placement   . Below knee leg amputation     PT Assessment/Plan/Recommendation PT Assessment Clinical Impression Statement: Pt. 66 y/o female admitted for PNA.  Difficult to fully assess pt's abilities as pt. refusing any further mobility at this time.  Appears pt. will need increased assistance at d/c and therefore unable to safely d/c home. PT Recommendation/Assessment: Patient will need skilled PT in the acute care venue PT Problem List: Decreased activity tolerance;Decreased mobility;Decreased balance;Cardiopulmonary status limiting activity Barriers to Discharge: Decreased caregiver support PT Therapy Diagnosis : Difficulty walking PT Plan PT Frequency: Min 3X/week PT Treatment/Interventions: DME instruction;Gait training;Functional mobility training;Therapeutic activities;Therapeutic exercise;Balance training;Patient/family education PT Recommendation Recommendations for Other Services: Rehab consult Follow Up Recommendations: Inpatient Rehab Equipment Recommended: Defer to next venue PT Goals  Acute Rehab PT Goals PT Goal Formulation: With patient Time For Goal Achievement: 7 days Pt will go Supine/Side to Sit: with  modified independence;with rail PT Goal: Supine/Side to Sit - Progress: Goal set today Pt will go Sit to Stand: with supervision;with upper extremity assist PT Goal: Sit to Stand - Progress: Goal set today Pt will go Stand to Sit: with supervision;with upper extremity assist PT Goal: Stand to Sit - Progress: Goal set today Pt will Transfer Bed to Chair/Chair to Bed: with supervision PT Transfer Goal: Bed to Chair/Chair to Bed - Progress: Goal set today Pt will Ambulate: 16 - 50 feet;with modified independence;with rolling walker;with other equipment (comment) (L BKA prosthesis) PT Goal: Ambulate - Progress: Goal set today  PT Evaluation Precautions/Restrictions  Precautions Precautions: Fall Restrictions Weight Bearing Restrictions: No Prior Functioning  Home Living Lives With: Spouse;Family (3 young grandchildren (62, 59, 40 y/o)) Receives Help From: Other (Comment) (none) Type of Home: House Home Layout: Two level;Able to live on main level with bedroom/bathroom;Full bath on main level Alternate Level Stairs-Rails:  (n/a) Home Access: Ramped entrance Bathroom Accessibility: Yes How Accessible: Accessible via wheelchair Home Adaptive Equipment: Straight cane;Wheelchair - manual Prior Function Level of Independence: Requires assistive device for independence;Independent with homemaking with wheelchair;Independent with basic ADLs;Independent with homemaking with ambulation Able to Take Stairs?: No Vocation: Retired Leisure: Hobbies-yes (Comment) Comments: pt. caregiver for 3 grandchildren Cognition Cognition Arousal/Alertness: Awake/alert Overall Cognitive Status: Appears within functional limits for tasks assessed Orientation Level: Oriented X4 Cognition - Other Comments: pt. with very flat/depressed affect throughout session Sensation/Coordination Sensation Light Touch: Not tested Stereognosis: Not tested Hot/Cold: Not tested Proprioception: Not tested Coordination Gross  Motor Movements are Fluid and Coordinated: Yes Fine Motor Movements are Fluid and Coordinated: Yes Extremity Assessment RUE Assessment RUE Assessment: Not tested LUE Assessment LUE Assessment: Not tested RLE Assessment RLE Assessment: Within Functional Limits LLE Assessment LLE Assessment: Exceptions  to [facility] LLE AROM (degrees) LLE Overall AROM Comments: Pt with L BKA Mobility (including Balance) Bed Mobility Bed Mobility: Yes Sit to Supine: 6: Modified independent (Device/Increase time);With rail Transfers Transfers: Yes Stand to Sit: 4: Min assist;With upper extremity assist;To bed Stand to Sit Details: pt. utilized UEs due to not wearing prosthesis Stand Pivot Transfers: 4: Min assist;With armrests (min A to pivot from [facility] to bed) Stand Pivot Transfer Details (indicate cue type and reason): min A to pivot RLE around to bed Ambulation/Gait Ambulation/Gait: No Stairs: No Wheelchair Mobility Wheelchair Mobility: No  Posture/Postural Control Posture/Postural Control: No significant limitations Balance Balance Assessed: No Exercise    End of Session PT - End of Session Equipment Utilized During Treatment:  (gait belt not utilized as pt. up with RN tech upon entering ) Activity Tolerance: Other (comment);Patient tolerated treatment well (Pt.refused ambulation) Patient left: in bed;with family/visitor present;with call bell in reach Nurse Communication: Mobility status for transfers General Behavior During Session: Flat affect Cognition: [facility] for tasks performed  Feltis, Nicki Reaper 11/05/2011, 3:19 PM  Nicki Reaper. Feltis, PT, DPT [phone]

## 2011-11-06 ENCOUNTER — Inpatient Hospital Stay (HOSPITAL_COMMUNITY): Payer: BC Managed Care – PPO

## 2011-11-06 LAB — GLUCOSE, CAPILLARY
Glucose-Capillary: 143 mg/dL — ABNORMAL HIGH (ref 70–99)
Glucose-Capillary: 160 mg/dL — ABNORMAL HIGH (ref 70–99)
Glucose-Capillary: 217 mg/dL — ABNORMAL HIGH (ref 70–99)

## 2011-11-06 LAB — BASIC METABOLIC PANEL
Calcium: 9.8 mg/dL (ref 8.4–10.5)
Creatinine, Ser: 7.14 mg/dL — ABNORMAL HIGH (ref 0.50–1.10)
GFR calc Af Amer: 6 mL/min — ABNORMAL LOW (ref >90)
GFR calc non Af Amer: 5 mL/min — ABNORMAL LOW (ref >90)
Sodium: 134 mEq/L — ABNORMAL LOW (ref 135–145)

## 2011-11-06 LAB — CBC
MCH: 30.4 pg (ref 26.0–34.0)
MCHC: 33.2 g/dL (ref 30.0–36.0)
Platelets: 229 10*3/uL (ref 150–400)

## 2011-11-06 MED ORDER — DARBEPOETIN ALFA-POLYSORBATE 25 MCG/0.42ML IJ SOLN
INTRAMUSCULAR | Status: AC
  Filled 2011-11-06: qty 0.42

## 2011-11-06 MED ORDER — WHITE PETROLATUM GEL
Status: AC
  Filled 2011-11-06: qty 5

## 2011-11-06 NOTE — Progress Notes (Signed)
PT Cancellation Note  Treatment cancelled today due to patient just returned from hemodialysis and beginning to eat her lunch.  Requested defer treatment presently.  Will reattempt this p.m. as able.Marland Kitchen  Faviola Klare 11/06/2011, 2:32 PM Pager [phone]

## 2011-11-06 NOTE — Progress Notes (Signed)
[facility] Progress Note Subjective:  Feels much better.  Do I have to stay on the full treatment?  Objective Filed Vitals:   11/06/11 1000 11/06/11 1030 11/06/11 1100 11/06/11 1130  BP: 111/37 137/59 126/50 103/50  Pulse: 63 67 69 63  Temp:      TempSrc: Oral Oral    Resp: 12 18  16   Height:      Weight:      SpO2: 98%      Physical Exam: on HD Wt 81.5 -below EDW if correct. General: NAD; sleeping, rouses easily Heart: RRR  Lungs: Coarse BS thoughout Abdomen: soft NT  Extremities: no sign. Left BKA edema; no right LE edema  Dialysis Access: LUA AVF, Qb 400  Problem/Plan:  1. PNA - on IV Levaquin, Vanc and cefipime  2. Chest pain- pleuritic; cyclic enzymes pend; history nonobstructive CAD followed by Dr. Myrtis Ser outpt; ? Significance of ^ troponins; neg VQ scan  3. ESRD - MWF [facility]; HD today; re-evaluate during HD and (BP and fluid removal);may have new EDW at d/c; eval post HD 4. Hypertension/PMH Nonobstructive CAD, NSTEMI, volume - history pulmonary HTN, medical noncompliance; BP is better on Hydralazine, Labetalol, Cardura and amlodipine. req cramps with tx and difficult fluid removal; will probably need new eDW; challenge volume as able; BP still high  5. Medical Noncompliance- reports previously scheduled to take Hydralazine 37.5 mg TID but frequently missing 3rd dose and with last visit Dr. Myrtis Ser, changed Hydralazine 50mg  BID; outpt med list BID on HD days and TID on non-HD days  6. Anemia - Hgb stable; same outpt epogen (4800)); give Aranesp 12.48mcg wk and follow; same wkly iron  7. Metabolic bone disease - was on tums Ultra as binders ; on Zemplar ; cont 2.25 ca bath and changed non-ca binder (Renagel 800mg - 2 w/meals and titrate if necessary); follow closely  8. DM/HLD/PVD- on statin therapy, daily ASA, CBG and SSI; defer to primary  9. Nutrition - albumin 3.5; ^ protein diet; Nepro suppl    Additional Objective Labs: Basic Metabolic Panel:  Lab  11/06/11 0908 11/05/11 0545 11/04/11 0605  NA 134* 135 136  K 4.2 3.9 4.7  CL 93* 94* 94*  CO2 24 26 23   GLUCOSE 140* 121* 229*  BUN 53* 32* 47*  CREATININE 7.14* 4.93* 7.02*  CALCIUM 9.8 10.0 10.5  ALB -- -- --  PHOS -- -- 6.7*   Liver Function Tests:  Lab 11/04/11 0605 11/03/11 2232  AST 47* 51*  ALT 34 34  ALKPHOS 85 87  BILITOT 0.5 0.4  PROT 7.4 7.9  ALBUMIN 3.5 3.7    Lab 11/03/11 2232  LIPASE 21  AMYLASE --  CBC:  Lab 11/06/11 0908 11/05/11 0545 11/04/11 0605 11/04/11 0225 11/03/11 2232  WBC 8.0 8.9 9.5 -- --  NEUTROABS -- -- -- -- 6.8  HGB 11.2* 11.0* 10.8* -- --  HCT 33.7* 33.4* 32.3* -- --  MCV 91.3 93.0 92.6 91.9 93.3  PLT 229 214 195 -- --   BlCardiac Enzymes:  Lab 11/04/11 1736 11/04/11 0840 11/04/11 0225 10/31/11 2113  CKTOTAL 362* 476* 470* --  CKMB 5.8* 6.4* 5.4* --  CKMBINDEX -- -- -- --  TROPONINI 0.50* 0.48* 0.33* <0.30   CBG:  Lab 11/06/11 0726 11/05/11 2143 11/05/11 1649 11/05/11 1244 11/05/11 0740  GLUCAP 143* 158* 130* 136* 133*   Studies/Results: Nm Pulmonary Perfusion  11/05/2011  *RADIOLOGY REPORT*  Clinical Data:  Respiratory distress.  NUCLEAR MEDICINE VENTILATION - PERFUSION  LUNG SCAN  Technique:  Wash-in, equilibrium, and wash-out phase ventilation images were obtained using Xe-133 gas.  Perfusion images were obtained in multiple projections after intravenous injection of Tc- 35m MAA.  Radiopharmaceuticals:  1.0 mCi Tc-37m MAA.  Comparison:  Ventilation perfusion scan 08/30/2010.  Plain films of the chest 11/03/2011.  Findings: Perfusion imaging demonstrates somewhat patchy radiotracer distribution but no segmental or subsegmental defect is identified.  IMPRESSION: Low probability for pulmonary embolus.  Original Report Authenticated By: Bernadene Bell. Maricela Curet, M.D.   Medications:      . amLODipine  10 mg Oral QHS  . aspirin EC  325 mg Oral Daily  . ceFEPime (MAXIPIME) IV  1 g Intravenous QHS  . darbepoetin (ARANESP) injection -  DIALYSIS  12.5 mcg Intravenous Q Mon-HD  . doxazosin  8 mg Oral QHS  . enoxaparin  30 mg Subcutaneous Daily  . ferric gluconate (FERRLECIT/NULECIT) IV  62.5 mg Intravenous Q Wed-HD  . hydrALAZINE  50 mg Oral Q8H  . insulin aspart  0-9 Units Subcutaneous TID WC  . isosorbide mononitrate  30 mg Oral Daily  . labetalol  400 mg Oral BID  . levofloxacin (LEVAQUIN) IV  500 mg Intravenous Q48H  . lisinopril  20 mg Oral QHS  . multivitamin  1 tablet Oral QHS  . paricalcitol  6 mcg Intravenous Q M,W,F-HD  . sevelamer  1,600 mg Oral TID WC  . simvastatin  20 mg Oral QPM  . sodium chloride  3 mL Intravenous Q12H  . vancomycin  750 mg Intravenous Q M,W,F-HD  . white petrolatum      . white petrolatum      . DISCONTD: sodium chloride  3 mL Intravenous Q12H    I  have reviewed scheduled and prn medications.  Sheffield Slider, PA-C [facility] Beeper [phone]  11/06/2011,12:38 PM  LOS: 3 days   Patient seen and examined and agree with assessment and plan as above.   Vinson Moselle  MD [facility] [phone] pgr    [phone] cell 11/06/2011, 4:41 PM

## 2011-11-06 NOTE — Progress Notes (Signed)
   CARE MANAGEMENT NOTE 11/06/2011  Patient:  Briana Gould, Briana Gould   Account Number:  [id number]  Date Initiated:  11/06/2011  Documentation initiated by:  Donn Pierini  Subjective/Objective Assessment:   Pt admitted with PNA     Action/Plan:   PTA pt lived with spouse at home, PT eval ordered   Anticipated DC Date:  11/11/2011   Anticipated DC Plan:  IP REHAB FACILITY      DC Planning Services  CM consult      Choice offered to / List presented to:             Status of service:  In process, will continue to follow Medicare Important Message given?   (If response is "NO", the following Medicare IM given date fields will be blank) Date Medicare IM given:   Date Additional Medicare IM given:    Discharge Disposition:    Per UR Regulation:    Comments:  PCP- Margaretmary Bayley  11/06/11 1115- Donn Pierini RN, BSN [phone] Attempted to see pt- pt in HD. Per PT notes recommend CIR. MD to order CIR consult. CM will follow up on d/c plans and needs.

## 2011-11-06 NOTE — Progress Notes (Addendum)
Subjective: Patient seen and examined after HD today. informs feeling better but still weak  Objective:  Vital signs in last 24 hours:  Filed Vitals:   11/06/11 1300 11/06/11 1330 11/06/11 1340 11/06/11 1745  BP: 126/52 107/50 155/68 150/62  Pulse: 69 70 68 69  Temp:   97.9 F (36.6 C) 99 F (37.2 C)  TempSrc:   Oral Oral  Resp:   15 20  Height:      Weight:   79.6 kg (175 lb 7.8 oz)   SpO2:   98% 93%    Intake/Output from previous day:   Intake/Output Summary (Last 24 hours) at 11/06/11 1814 Last data filed at 11/06/11 1340  Gross per 24 hour  Intake    230 ml  Output   2619 ml  Net  -2389 ml    Physical Exam:  General:elderly female  in no acute distress. HEENT: no pallor, no icterus, moist oral mucosa, no JVD, no lymphadenopathy Heart: Normal  s1 &s2  Regular rate and rhythm, without murmurs, rubs, gallops. Lungs: Clear to auscultation bilaterally. Abdomen: Soft, nontender, nondistended, positive bowel sounds. Extremities: left BKA, No clubbing cyanosis or edema with positive pedal pulses. Neuro: Alert, awake, oriented x3, nonfocal.   Lab Results:  Basic Metabolic Panel:    Component Value Date/Time   NA 134* 11/06/2011 0908   K 4.2 11/06/2011 0908   CL 93* 11/06/2011 0908   CO2 24 11/06/2011 0908   BUN 53* 11/06/2011 0908   CREATININE 7.14* 11/06/2011 0908   GLUCOSE 140* 11/06/2011 0908   CALCIUM 9.8 11/06/2011 0908   CBC:    Component Value Date/Time   WBC 8.0 11/06/2011 0908   HGB 11.2* 11/06/2011 0908   HCT 33.7* 11/06/2011 0908   PLT 229 11/06/2011 0908   MCV 91.3 11/06/2011 0908   NEUTROABS 6.8 11/03/2011 2232   LYMPHSABS 0.8 11/03/2011 2232   MONOABS 0.4 11/03/2011 2232   EOSABS 0.0 11/03/2011 2232   BASOSABS 0.0 11/03/2011 2232    Recent Results (from the past 240 hour(s))  MRSA PCR SCREENING     Status: Normal   Collection Time   11/04/11  2:56 AM      Component Value Range Status Comment   MRSA by PCR NEGATIVE  NEGATIVE  Final      Studies/Results: Nm Pulmonary Perfusion  11/05/2011  *RADIOLOGY REPORT*  Clinical Data:  Respiratory distress.  NUCLEAR MEDICINE VENTILATION - PERFUSION LUNG SCAN  Technique:  Wash-in, equilibrium, and wash-out phase ventilation images were obtained using Xe-133 gas.  Perfusion images were obtained in multiple projections after intravenous injection of Tc- 31m MAA.  Radiopharmaceuticals:  1.0 mCi Tc-84m MAA.  Comparison:  Ventilation perfusion scan 08/30/2010.  Plain films of the chest 11/03/2011.  Findings: Perfusion imaging demonstrates somewhat patchy radiotracer distribution but no segmental or subsegmental defect is identified.  IMPRESSION: Low probability for pulmonary embolus.  Original Report Authenticated By: Bernadene Bell. Maricela Curet, M.D.    Medications: Scheduled Meds:   . amLODipine  10 mg Oral QHS  . aspirin EC  325 mg Oral Daily  . ceFEPime (MAXIPIME) IV  1 g Intravenous QHS  . darbepoetin (ARANESP) injection - DIALYSIS  12.5 mcg Intravenous Q Mon-HD  . doxazosin  8 mg Oral QHS  . enoxaparin  30 mg Subcutaneous Daily  . ferric gluconate (FERRLECIT/NULECIT) IV  62.5 mg Intravenous Q Wed-HD  . hydrALAZINE  50 mg Oral Q8H  . insulin aspart  0-9 Units Subcutaneous TID WC  . isosorbide mononitrate  30 mg Oral Daily  . labetalol  400 mg Oral BID  . levofloxacin (LEVAQUIN) IV  500 mg Intravenous Q48H  . lisinopril  20 mg Oral QHS  . multivitamin  1 tablet Oral QHS  . paricalcitol  6 mcg Intravenous Q M,W,F-HD  . sevelamer  1,600 mg Oral TID WC  . simvastatin  20 mg Oral QPM  . sodium chloride  3 mL Intravenous Q12H  . vancomycin  750 mg Intravenous Q M,W,F-HD  . white petrolatum      . white petrolatum       Continuous Infusions:  PRN Meds:.acetaminophen, acetaminophen, calcium carbonate (dosed in mg elemental calcium), camphor-menthol, docusate sodium, feeding supplement (NEPRO CARB STEADY), heparin, heparin, hydrOXYzine, labetalol, lidocaine, lidocaine-prilocaine, ondansetron  (ZOFRAN) IV, ondansetron (ZOFRAN) IV, ondansetron, ondansetron, pentafluoroprop-tetrafluoroeth, sorbitol, zolpidem  Assessment/ 66 year-old female with history of ESRD on hemodialysis on Monday Wednesday and Friday, history of hypertension and diabetes mellitus type 2 presented to the ER because of ongoing chest pain with cough fever and chills over past 10 days with findings of PNA.  Plan: Pneumonia  - continue IV antibiotics with Vancomycin, cefepime and Levaquin  - NM V/Q scan showed low probability for PE  - nebulizer treatments as needed for wheezing and shortness of breath   DM  - sliding scale insulin  - CBG monitoring   Positive Troponin  - ? demand ischemia , patient had chest pain on presentation which was pleuritic. She is on HD and would not follow troponins further  Hypertension -secondary to non compliance/ fluid overload  - uncontrolled with multiple medications Norvasc, hydralazine, Imdur, labetalol and lisinopril  - slightly improved after HD today. Will reassess in am   ESRD (end stage renal disease) on dialysis    Seen by PT and recommends CIR. Have consulted     LOS: 3 days   Hannibal Skalla 11/06/2011, 6:14 PM

## 2011-11-06 NOTE — Procedures (Signed)
I was present at this dialysis session. I have reviewed the session itself and made appropriate changes. BFR was 250 and BP 125/80.   Briana Moselle, MD [facility] 11/06/2011, 4:36 PM

## 2011-11-07 DIAGNOSIS — J159 Unspecified bacterial pneumonia: Secondary | ICD-10-CM

## 2011-11-07 DIAGNOSIS — L98499 Non-pressure chronic ulcer of skin of other sites with unspecified severity: Secondary | ICD-10-CM

## 2011-11-07 DIAGNOSIS — S88119A Complete traumatic amputation at level between knee and ankle, unspecified lower leg, initial encounter: Secondary | ICD-10-CM

## 2011-11-07 DIAGNOSIS — I739 Peripheral vascular disease, unspecified: Secondary | ICD-10-CM

## 2011-11-07 LAB — GLUCOSE, CAPILLARY: Glucose-Capillary: 176 mg/dL — ABNORMAL HIGH (ref 70–99)

## 2011-11-07 LAB — BASIC METABOLIC PANEL
CO2: 29 mEq/L (ref 19–32)
Calcium: 10.2 mg/dL (ref 8.4–10.5)
GFR calc non Af Amer: 8 mL/min — ABNORMAL LOW (ref >90)
Glucose, Bld: 172 mg/dL — ABNORMAL HIGH (ref 70–99)
Potassium: 4 mEq/L (ref 3.5–5.1)
Sodium: 136 mEq/L (ref 135–145)

## 2011-11-07 LAB — CBC
Hemoglobin: 11.8 g/dL — ABNORMAL LOW (ref 12.0–15.0)
MCH: 30.3 pg (ref 26.0–34.0)
Platelets: 263 10*3/uL (ref 150–400)
RBC: 3.89 MIL/uL (ref 3.87–5.11)

## 2011-11-07 MED ORDER — PROMETHAZINE HCL 25 MG/ML IJ SOLN: 12.5000 mg | Freq: Four times a day (QID) | INTRAMUSCULAR | Status: DC | PRN

## 2011-11-07 MED ORDER — PANTOPRAZOLE SODIUM 40 MG PO TBEC
40.0000 mg | DELAYED_RELEASE_TABLET | Freq: Every day | ORAL | Status: DC
  Administered 2011-11-07 – 2011-11-08 (×2): 40 mg via ORAL
  Filled 2011-11-07 (×3): qty 1

## 2011-11-07 MED ORDER — HEPARIN SODIUM (PORCINE) 1000 UNIT/ML DIALYSIS
100.0000 [IU]/kg | INTRAMUSCULAR | Status: DC | PRN
  Filled 2011-11-07: qty 8

## 2011-11-07 MED ORDER — GUAIFENESIN-DM 100-10 MG/5ML PO SYRP
10.0000 mL | ORAL_SOLUTION | ORAL | Status: DC | PRN
  Administered 2011-11-07: 10 mL via ORAL
  Filled 2011-11-07: qty 10

## 2011-11-07 MED ORDER — ALUM & MAG HYDROXIDE-SIMETH 200-200-20 MG/5ML PO SUSP
30.0000 mL | Freq: Four times a day (QID) | ORAL | Status: DC | PRN
  Filled 2011-11-07: qty 30

## 2011-11-07 NOTE — Progress Notes (Signed)
Expect patient to be able to go home with home health soon. Inpatient acute rehab not needed. Please call with any questions. Pager [phone]

## 2011-11-07 NOTE — Progress Notes (Signed)
[facility] Progress Note Subjective:  Gagging with food; can't eat. "Haven't eaten or had a BM in a week" Denies constipation, says no stool because she hasn't eaten.  Objective Filed Vitals:   11/06/11 1745 11/06/11 2147 11/07/11 0635 11/07/11 0945  BP: 150/62 188/72 147/85 158/64  Pulse: 69 69 65 84  Temp: 99 F (37.2 C) 98.4 F (36.9 C) 98.2 F (36.8 C) 98.2 F (36.8 C)  TempSrc: Oral Oral Oral Oral  Resp: 20 19 18 20   Height:  5\' 6"  (1.676 m)    Weight:  78.155 kg (172 lb 4.8 oz)    SpO2: 93% 100% 98%    Physical Exam:  General: Curled up in bed. Feels badly, not forthcoming with questioning regarding symptoms Heart: RRR  Lungs: Fairly clear with somewhat coarse BS Abdomen: soft NT  Extremities: no sign. Left BKA edema; no right LE edema  Dialysis Access: LUA AVF,patent; some swelling distally ? Small infiltration  Problem/Plan: 1. PNA - on maxipime, levaquin and Vanc;  agree with pharmacy recommendations re antibiotics. 2. ESRD - MWF Significantly below EDW of 85 ; schedule for AM HD due to anticipated d/c 3. Chest pain- pleuritic; cyclic enzymes pend; history nonobstructive CAD followed by Dr. Myrtis Ser outpt; ? Significance of ^ troponins; neg VQ scan  4. Hypertension/PMH Nonobstructive CAD, NSTEMI, volume - history pulmonary HTN, medical noncompliance; BP is better on Hydralazine, Labetalol, Cardura and amlodipine. req cramps with tx and difficult fluid removal; BP still elevated, but much better 5. Medical Noncompliance- reports previously scheduled to take Hydralazine 37.5 mg TID but frequently missing 3rd dose and with last visit Dr. Myrtis Ser, changed Hydralazine 50mg  BID; outpt med list BID on HD days and TID on non-HD days  6. Anemia - Hgb increased, possibily due to hemoconcentration; same outpt epogen (4800)); received Aranesp 12.40mcg/ wk ; same wkly iron  7. Metabolic bone disease - was on tums Ultra as binders ; on Zemplar ; changed non-ca binder (Renagel  800mg - 2 w/meals and titrate if necessary); follow closely; use 2 Ca bath Friday due to elevated Ca; will need Rx for renagel at d/c and hopefully her insurance will cover 8. DM/HLD/PVD- on statin therapy, daily ASA, CBG and SSI; defer to primary  9. Nutrition - albumin 3.5; ^ protein diet; Nepro suppl; intake poor 10. Nausea and vomiting - prn antiemtics ? Gastroparesis; give laxative x 1 due to no BM x one week. Abdomen is soft.  Will try empiric PPI therapy.    Additional Objective Labs: Basic Metabolic Panel:  Lab 11/07/11 1478 11/06/11 0908 11/05/11 0545 11/04/11 0605  NA 136 134* 135 --  K 4.0 4.2 3.9 --  CL 94* 93* 94* --  CO2 29 24 26  --  GLUCOSE 172* 140* 121* --  BUN 27* 53* 32* --  CREATININE 4.90* 7.14* 4.93* --  CALCIUM 10.2 9.8 10.0 --  ALB -- -- -- --  PHOS -- -- -- 6.7*   Liver Function Tests:  Lab 11/04/11 0605 11/03/11 2232  AST 47* 51*  ALT 34 34  ALKPHOS 85 87  BILITOT 0.5 0.4  PROT 7.4 7.9  ALBUMIN 3.5 3.7    Lab 11/03/11 2232  LIPASE 21  AMYLASE --  CBC:  Lab 11/07/11 0622 11/06/11 0908 11/05/11 0545 11/04/11 0605 11/04/11 0225 11/03/11 2232  WBC 9.4 8.0 8.9 -- -- --  NEUTROABS -- -- -- -- -- 6.8  HGB 11.8* 11.2* 11.0* -- -- --  HCT 36.3 33.7* 33.4* -- -- --  MCV 93.3 91.3 93.0 92.6 91.9 --  PLT 263 229 214 -- -- --   Blood Culture    Component Value Date/Time   SDES BLOOD RIGHT ARM 08/29/2010 1755   SPECREQUEST BOTTLES DRAWN AEROBIC ONLY 10CC 08/29/2010 1755   CULT NO GROWTH 5 DAYS 08/29/2010 1755   REPTSTATUS 09/04/2010 FINAL 08/29/2010 1755    Cardiac Enzymes:  Lab 11/04/11 1736 11/04/11 0840 11/04/11 0225 10/31/11 2113  CKTOTAL 362* 476* 470* --  CKMB 5.8* 6.4* 5.4* --  CKMBINDEX -- -- -- --  TROPONINI 0.50* 0.48* 0.33* <0.30   CBG:  Lab 11/07/11 1145 11/07/11 0731 11/06/11 2144 11/06/11 1905 11/06/11 1544  GLUCAP 176* 164* 174* 217* 160*  Medications:      . amLODipine  10 mg Oral QHS  . aspirin EC  325 mg Oral Daily    . ceFEPime (MAXIPIME) IV  1 g Intravenous QHS  . darbepoetin (ARANESP) injection - DIALYSIS  12.5 mcg Intravenous Q Mon-HD  . doxazosin  8 mg Oral QHS  . enoxaparin  30 mg Subcutaneous Daily  . ferric gluconate (FERRLECIT/NULECIT) IV  62.5 mg Intravenous Q Wed-HD  . hydrALAZINE  50 mg Oral Q8H  . insulin aspart  0-9 Units Subcutaneous TID WC  . isosorbide mononitrate  30 mg Oral Daily  . labetalol  400 mg Oral BID  . levofloxacin (LEVAQUIN) IV  500 mg Intravenous Q48H  . lisinopril  20 mg Oral QHS  . multivitamin  1 tablet Oral QHS  . paricalcitol  6 mcg Intravenous Q M,W,F-HD  . sevelamer  1,600 mg Oral TID WC  . simvastatin  20 mg Oral QPM  . sodium chloride  3 mL Intravenous Q12H  . vancomycin  750 mg Intravenous Q M,W,F-HD  . white petrolatum        I  have reviewed scheduled and prn medications.  Sheffield Slider, PA-C  [facility] [phone]  11/07/2011,12:45 PM  LOS: 4 days   Patient seen and examined and agree with assessment and plan as above.  Reporting persistent nausea and gagging with attempts to eat.  Abdomen is benign, no diarrhea.  No BM since admit.  Giving laxative, will also try empiric PPI therapy, ?of gastritis.  Vinson Moselle  MD [facility] [phone]    [phone] cell 11/07/2011, 1:23 PM

## 2011-11-07 NOTE — Consult Note (Signed)
Physical Medicine and Rehabilitation Consult Reason for Consult: Deconditioning/pneumonia/history left BKA Referring Phsyician: [facility] Briana Gould is an 66 y.o. female.   HPI: 66 year old black right-handed female with end-stage renal disease as well as left below knee amputation in 2002 of which she received inpatient rehabilitation services. Admitted February 25 with cough, fever and chills over the past week. She had recently come to the emergency room 4 days prior sent home on Zithromax. Chest x-ray completed showing vascular congestion suspect pneumonia. Placed on vancomycin and Zosyn. VQ scan showed low probability for pulmonary embolus. Subcutaneous Lovenox added for deep vein thrombosis prophylaxis. Antibiotics have since been changed to Maxipime as well as Levaquin. Hemodialysis ongoing as per [facility]. Physical therapy evaluation and treatments ongoing with request for physical medicine rehabilitation consult due to deconditioning.  Review of Systems  Constitutional: Positive for fever and chills.  Respiratory: Positive for cough and sputum production.   Gastrointestinal: Positive for constipation.  Musculoskeletal: Positive for joint pain.  Neurological: Positive for headaches.  All other systems reviewed and are negative.   Past Medical History  Diagnosis Date  . End stage renal disease on dialysis   . Diabetes mellitus   . Hypertension   . PAD (peripheral artery disease)    Past Surgical History  Procedure Date  . Av fistula placement   . Below knee leg amputation    History reviewed. No pertinent family history. Social History:  reports that she has never smoked. She does not have any smokeless tobacco history on file. She reports that she does not drink alcohol or use illicit drugs. Allergies: No Known Allergies Medications Prior to Admission  Medication Dose Route Frequency Provider Last Rate Last Dose  . 0.9 %  sodium chloride  infusion   Intravenous Once Toy Baker, MD 125 mL/hr at 11/04/11 0052    . acetaminophen (TYLENOL) tablet 650 mg  650 mg Oral Q6H PRN Eduard Clos, MD   650 mg at 11/05/11 1854   Or  . acetaminophen (TYLENOL) suppository 650 mg  650 mg Rectal Q6H PRN Eduard Clos, MD      . alteplase (CATHFLO ACTIVASE) injection 2 mg  2 mg Intracatheter Once PRN Lizbeth Bark, FNP      . amLODipine (NORVASC) tablet 10 mg  10 mg Oral QHS Lizbeth Bark, FNP   10 mg at 11/05/11 2148  . aspirin EC tablet 325 mg  325 mg Oral Daily Lizbeth Bark, FNP   325 mg at 11/06/11 2143  . aspirin EC tablet 325 mg  325 mg Oral Once Manson Passey, MD   325 mg at 11/04/11 1555  . calcium carbonate (dosed in mg elemental calcium) suspension 500 mg of elemental calcium  500 mg of elemental calcium Oral Q6H PRN Lizbeth Bark, FNP   500 mg of elemental calcium at 11/06/11 1838  . camphor-menthol (SARNA) lotion 1 application  1 application Topical Q8H PRN Lizbeth Bark, FNP       And  . hydrOXYzine (ATARAX/VISTARIL) tablet 25 mg  25 mg Oral Q8H PRN Lizbeth Bark, FNP      . ceFEPIme (MAXIPIME) 1 g in dextrose 5 % 50 mL IVPB  1 g Intravenous Once Toy Baker, MD   1 g at 11/04/11 0603  . ceFEPIme (MAXIPIME) 1 g in dextrose 5 % 50 mL IVPB  1 g Intravenous QHS Toy Baker, MD   1 g at 11/06/11 2138  . darbepoetin (  ARANESP) injection 12.5 mcg  12.5 mcg Intravenous Q Mon-HD Lizbeth Bark, FNP   12.5 mcg at 11/06/11 1043  . docusate sodium (ENEMEEZ) enema 283 mg  1 enema Rectal PRN Lizbeth Bark, FNP      . doxazosin (CARDURA) tablet 8 mg  8 mg Oral QHS Lizbeth Bark, FNP   8 mg at 11/05/11 2149  . enoxaparin (LOVENOX) injection 30 mg  30 mg Subcutaneous Daily Eduard Clos, MD   30 mg at 11/06/11 2142  . feeding supplement (NEPRO CARB STEADY) liquid 237 mL  237 mL Oral TID PRN Lizbeth Bark, FNP      . ferric gluconate (NULECIT) 62.5 mg in sodium chloride 0.9 % 100 mL IVPB  62.5 mg Intravenous Q Wed-HD Lizbeth Bark, FNP   62.5 mg  at 11/06/11 1053  . guaiFENesin-dextromethorphan (ROBITUSSIN DM) 100-10 MG/5ML syrup 10 mL  10 mL Oral Q4H PRN Maren Reamer, NP   10 mL at 11/07/11 0112  . heparin injection 1,000 Units  1,000 Units Dialysis PRN Lizbeth Bark, FNP      . heparin injection 1,600 Units  20 Units/kg Dialysis PRN Sheffield Slider, PA      . hydrALAZINE (APRESOLINE) tablet 50 mg  50 mg Oral Q8H Manson Passey, MD   50 mg at 11/06/11 2143  . insulin aspart (novoLOG) injection 0-9 Units  0-9 Units Subcutaneous TID WC Eduard Clos, MD   1 Units at 11/06/11 0813  . isosorbide mononitrate (IMDUR) 24 hr tablet 30 mg  30 mg Oral Daily Lizbeth Bark, FNP   30 mg at 11/05/11 0941  . labetalol (NORMODYNE) tablet 400 mg  400 mg Oral BID Lizbeth Bark, FNP   400 mg at 11/06/11 2143  . labetalol (NORMODYNE,TRANDATE) injection 10 mg  10 mg Intravenous Q2H PRN Eduard Clos, MD      . levofloxacin [facility]) IVPB 500 mg  500 mg Intravenous Q48H Rolley Sims, [place]   500 mg at 11/06/11 2236  . Levofloxacin (LEVAQUIN) IVPB 750 mg  750 mg Intravenous Once Toy Baker, MD   750 mg at 11/04/11 1557  . lidocaine (XYLOCAINE) 1 % injection 5 mL  5 mL Intradermal PRN Lizbeth Bark, FNP      . lidocaine-prilocaine (EMLA) cream 1 application  1 application Topical PRN Lizbeth Bark, FNP      . lisinopril (PRINIVIL,ZESTRIL) tablet 20 mg  20 mg Oral QHS Lizbeth Bark, FNP   20 mg at 11/06/11 2143  . multivitamin (RENA-VIT) tablet 1 tablet  1 tablet Oral QHS Lizbeth Bark, FNP   1 tablet at 11/05/11 2149  . ondansetron (ZOFRAN) injection 4 mg  4 mg Intravenous Once Toy Baker, MD   4 mg at 11/04/11 0053  . ondansetron (ZOFRAN) tablet 4 mg  4 mg Oral Q6H PRN Eduard Clos, MD       Or  . ondansetron [facility]) injection 4 mg  4 mg Intravenous Q6H PRN Eduard Clos, MD   4 mg at 11/05/11 1505  . ondansetron (ZOFRAN) tablet 4 mg  4 mg Oral Q6H PRN Lizbeth Bark, FNP       Or  . ondansetron [facility]) injection 4 mg  4 mg  Intravenous Q6H PRN Lizbeth Bark, FNP   4 mg at 11/04/11 2035  . paricalcitol (ZEMPLAR) injection 6 mcg  6 mcg Intravenous Q M,W,F-HD Lizbeth Bark, FNP   6  mcg at 11/06/11 1400  . pentafluoroprop-tetrafluoroeth (GEBAUERS) aerosol 1 application  1 application Topical PRN Lizbeth Bark, FNP      . piperacillin-tazobactam (ZOSYN) IVPB 3.375 g  3.375 g Intravenous Once Toy Baker, MD   3.375 g at 11/04/11 0053  . sevelamer (RENVELA) tablet 1,600 mg  1,600 mg Oral TID WC Lizbeth Bark, FNP   1,600 mg at 11/06/11 1823  . simvastatin (ZOCOR) tablet 20 mg  20 mg Oral QPM Eduard Clos, MD   20 mg at 11/05/11 1853  . sodium chloride 0.9 % injection 3 mL  3 mL Intravenous Q12H Eduard Clos, MD   3 mL at 11/06/11 2235  . sorbitol 70 % solution 30 mL  30 mL Oral PRN Lizbeth Bark, FNP      . technetium albumin aggregated (MAA) injection solution 3 milli Curie  3 milli Curie Intravenous Once PRN Medication Radiologist, MD   3 milli Curie at 11/05/11 1125  . vancomycin (VANCOCIN) 500 mg in sodium chloride 0.9 % 100 mL IVPB  500 mg Intravenous Once Toy Baker, MD   500 mg at 11/04/11 0657  . vancomycin (VANCOCIN) 750 mg in sodium chloride 0.9 % 150 mL IVPB  750 mg Intravenous Q M,W,F-HD Toy Baker, MD   750 mg at 11/06/11 1238  . vancomycin (VANCOCIN) IVPB 1000 mg/200 mL premix  1,000 mg Intravenous Once Toy Baker, MD   1,000 mg at 11/04/11 0204  . white petrolatum (VASELINE) gel           . white petrolatum (VASELINE) gel           . zolpidem (AMBIEN) tablet 5 mg  5 mg Oral QHS PRN Lizbeth Bark, FNP      . DISCONTD: 0.9 %  sodium chloride infusion  100 mL Intravenous PRN Lizbeth Bark, FNP      . DISCONTD: 0.9 %  sodium chloride infusion  100 mL Intravenous PRN Lizbeth Bark, FNP      . DISCONTD: amLODipine (NORVASC) tablet 10 mg  10 mg Oral Daily Eduard Clos, MD      . DISCONTD: aspirin EC tablet 81 mg  81 mg Oral Daily Eduard Clos, MD      . DISCONTD: heparin injection  8,000 Units  8,000 Units Dialysis PRN Lizbeth Bark, FNP   8,000 Units at 11/04/11 [phone]  . DISCONTD: hydrALAZINE (APRESOLINE) tablet 37.5 mg  37.5 mg Oral Q8H Eduard Clos, MD   37.5 mg at 11/04/11 1555  . DISCONTD: labetalol (NORMODYNE) tablet 200 mg  200 mg Oral BID Eduard Clos, MD   200 mg at 11/04/11 0604  . DISCONTD: levofloxacin (LEVAQUIN) IVPB 500 mg  500 mg Intravenous Q48H Toy Baker, MD      . DISCONTD: multivitamin (RENA-VIT) tablet 1 tablet  1 tablet Oral Daily Lizbeth Bark, FNP      . DISCONTD: sodium chloride 0.9 % injection 3 mL  3 mL Intravenous Q12H Eduard Clos, MD   3 mL at 11/04/11 1554   Medications Prior to Admission  Medication Sig Dispense Refill  . amLODipine (NORVASC) 10 MG tablet Take 10 mg by mouth daily.      Marland Kitchen azithromycin (ZITHROMAX) 250 MG tablet Take 250 mg by mouth daily. Take first 2 tablets together, then 1 every day until finished.      . hydrALAZINE (APRESOLINE) 25 MG tablet Take 37.5 mg by mouth  3 (three) times daily.      Marland Kitchen labetalol (NORMODYNE) 200 MG tablet Take 200 mg by mouth 2 (two) times daily.      . simvastatin (ZOCOR) 20 MG tablet Take 20 mg by mouth every evening.        Home: Home Living Lives With: Spouse;Family (3 young grandchildren (40, 44, 38 y/o)) Receives Help From: Other (Comment) (none) Type of Home: House Home Layout: Two level;Able to live on main level with bedroom/bathroom;Full bath on main level Alternate Level Stairs-Rails:  (n/a) Home Access: Ramped entrance Bathroom Accessibility: Yes How Accessible: Accessible via wheelchair Home Adaptive Equipment: Straight cane;Wheelchair - manual  Functional History: Prior Function Level of Independence: Requires assistive device for independence;Independent with homemaking with wheelchair;Independent with basic ADLs;Independent with homemaking with ambulation Able to Take Stairs?: No Vocation: Retired Leisure: Hobbies-yes (Comment) Comments: pt.  caregiver for 3 grandchildren Functional Status:  Mobility: Bed Mobility Bed Mobility: Yes Sit to Supine: 6: Modified independent (Device/Increase time);With rail Transfers Transfers: Yes Stand to Sit: 4: Min assist;With upper extremity assist;To bed Stand to Sit Details: pt. utilized UEs due to not wearing prosthesis Stand Pivot Transfers: 4: Min assist;With armrests (min A to pivot from [facility] to bed) Stand Pivot Transfer Details (indicate cue type and reason): min A to pivot RLE around to bed Ambulation/Gait Ambulation/Gait: No Stairs: No Wheelchair Mobility Wheelchair Mobility: No  ADL:    Cognition: Cognition Arousal/Alertness: Awake/alert Orientation Level: Oriented X4 Cognition Arousal/Alertness: Awake/alert Overall Cognitive Status: Appears within functional limits for tasks assessed Orientation Level: Oriented X4 Cognition - Other Comments: pt. with very flat/depressed affect throughout session  Blood pressure 188/72, pulse 69, temperature 98.4 F (36.9 C), temperature source Oral, resp. rate 19, height 5\' 6"  (1.676 m), weight 78.155 kg (172 lb 4.8 oz), SpO2 100.00%. Physical Exam  Nursing note and vitals reviewed. Constitutional: She is oriented to person, place, and time. She appears well-developed.  HENT:  Head: Normocephalic.  Neck: Normal range of motion. Neck supple. No thyromegaly present.  Cardiovascular: Regular rhythm.   Pulmonary/Chest: Breath sounds normal.       Decreased breath sounds at the bases  Abdominal: She exhibits no distension. There is no tenderness.  Neurological: She is alert and oriented to person, place, and time.       Patient's alert and oriented. She sitting edge of bed with good balance. Strength in all 4 limbs is 4-5 out of 5. No gross Sentry findings are seen. Cognitively she is intact.  Skin:       Left below knee amputation site well healed with callus over the distal and with adherence to the tibia.  Psychiatric: She has a  normal mood and affect.    Results for orders placed during the hospital encounter of 11/03/11 (from the past 24 hour(s))  GLUCOSE, CAPILLARY     Status: Abnormal   Collection Time   11/06/11  7:26 AM      Component Value Range   Glucose-Capillary 143 (*) 70 - 99 (mg/dL)   Comment 1 Notify RN     Comment 2 Documented in Chart    CBC     Status: Abnormal   Collection Time   11/06/11  9:08 AM      Component Value Range   WBC 8.0  4.0 - 10.5 (K/uL)   RBC 3.69 (*) 3.87 - 5.11 (MIL/uL)   Hemoglobin 11.2 (*) 12.0 - 15.0 (g/dL)   HCT 56.2 (*) 13.0 - 46.0 (%)   MCV 91.3  78.0 - 100.0 (fL)   MCH 30.4  26.0 - 34.0 (pg)   MCHC 33.2  30.0 - 36.0 (g/dL)   RDW 78.4  69.6 - 29.5 (%)   Platelets 229  150 - 400 (K/uL)  BASIC METABOLIC PANEL     Status: Abnormal   Collection Time   11/06/11  9:08 AM      Component Value Range   Sodium 134 (*) 135 - 145 (mEq/L)   Potassium 4.2  3.5 - 5.1 (mEq/L)   Chloride 93 (*) 96 - 112 (mEq/L)   CO2 24  19 - 32 (mEq/L)   Glucose, Bld 140 (*) 70 - 99 (mg/dL)   BUN 53 (*) 6 - 23 (mg/dL)   Creatinine, Ser 2.84 (*) 0.50 - 1.10 (mg/dL)   Calcium 9.8  8.4 - 13.2 (mg/dL)   GFR calc non Af Amer 5 (*) >90 (mL/min)   GFR calc Af Amer 6 (*) >90 (mL/min)  GLUCOSE, CAPILLARY     Status: Abnormal   Collection Time   11/06/11  3:44 PM      Component Value Range   Glucose-Capillary 160 (*) 70 - 99 (mg/dL)   Comment 1 Notify RN     Comment 2 Documented in Chart    GLUCOSE, CAPILLARY     Status: Abnormal   Collection Time   11/06/11  7:05 PM      Component Value Range   Glucose-Capillary 217 (*) 70 - 99 (mg/dL)   Comment 1 Notify RN     Comment 2 Documented in Chart    GLUCOSE, CAPILLARY     Status: Abnormal   Collection Time   11/06/11  9:44 PM      Component Value Range   Glucose-Capillary 174 (*) 70 - 99 (mg/dL)   Comment 1 Documented in Chart     Comment 2 Notify RN     Nm Pulmonary Perfusion  11/05/2011  *RADIOLOGY REPORT*  Clinical Data:  Respiratory  distress.  NUCLEAR MEDICINE VENTILATION - PERFUSION LUNG SCAN  Technique:  Wash-in, equilibrium, and wash-out phase ventilation images were obtained using Xe-133 gas.  Perfusion images were obtained in multiple projections after intravenous injection of Tc- 90m MAA.  Radiopharmaceuticals:  1.0 mCi Tc-75m MAA.  Comparison:  Ventilation perfusion scan 08/30/2010.  Plain films of the chest 11/03/2011.  Findings: Perfusion imaging demonstrates somewhat patchy radiotracer distribution but no segmental or subsegmental defect is identified.  IMPRESSION: Low probability for pulmonary embolus.  Original Report Authenticated By: Bernadene Bell. Maricela Curet, M.D.    Assessment/Plan: Diagnosis: Weakness related to pneumonia. Patient also with a history of a left below knee amputation/prosthesis. 1. Does the need for close, 24 hr/day medical supervision in concert with the patient's rehab needs make it unreasonable for this patient to be served in a less intensive setting? No 2. Co-Morbidities requiring supervision/potential complications: End-stage renal disease, hypertension, diabetes 3. Due to bladder management, bowel management, safety, skin/wound care, disease management, medication administration and pain management, does the patient require 24 hr/day rehab nursing? No 4. Does the patient require coordinated care of a physician, rehab nurse, PT, OT to address physical and functional deficits in the context of the above medical diagnosis(es)? No Addressing deficits in the following areas: balance, grooming and toileting 5. Can the patient actively participate in an intensive therapy program of at least 3 hrs of therapy per day at least 5 days per week? No 6. The potential for patient to make measurable gains while on inpatient rehab is Not  applicable 7. Anticipated functional outcomes upon discharge from inpatients are not app 8. Estimated rehab length of stay to reach the above functional goals is: Not  applicable 9. Does the patient have adequate social supports to accommodate these discharge functional goals? Not applicable 10. Anticipated D/C setting: Home 11. Anticipated post D/C treatments: HH therapy 12. Overall Rehab/Functional Prognosis: excellent  RECOMMENDATIONS: This patient's condition is appropriate for continued rehabilitative care in the following setting: [facility] Patient has agreed to participate in recommended program. Yes Note that insurance prior authorization may be required for reimbursement for recommended care.  Comment: Patient is returning to her baseline. I suspect after 2-3 more days of recovery and therapy here and acute that she should be able to return home with her husband. The bigger question here seems to be assistance long term in regards to the management and care of her grandchildren which is becoming a strain on her.   Ranelle Oyster M.D. 11/07/2011

## 2011-11-07 NOTE — Progress Notes (Signed)
Subjective: Patient seen and examined this am. Still feels tired . SOB better  Objective:  Vital signs in last 24 hours:  Filed Vitals:   11/06/11 2147 11/07/11 0635 11/07/11 0945 11/07/11 1245  BP: 188/72 147/85 158/64 131/42  Pulse: 69 65 84 61  Temp: 98.4 F (36.9 C) 98.2 F (36.8 C) 98.2 F (36.8 C) 97.7 F (36.5 C)  TempSrc: Oral Oral Oral Oral  Resp: 19 18 20 20   Height: 5\' 6"  (1.676 m)     Weight: 78.155 kg (172 lb 4.8 oz)     SpO2: 100% 98%  98%    Intake/Output from previous day:   Intake/Output Summary (Last 24 hours) at 11/07/11 1711 Last data filed at 11/07/11 1230  Gross per 24 hour  Intake    871 ml  Output      0 ml  Net    871 ml    Physical Exam:  General:elderly female in no acute distress.  HEENT: no pallor, no icterus, moist oral mucosa, no JVD, no lymphadenopathy  Heart: Normal s1 &s2 Regular rate and rhythm, without murmurs, rubs, gallops.  Lungs: Clear to auscultation bilaterally.  Abdomen: Soft, nontender, nondistended, positive bowel sounds.  Extremities: left BKA, No clubbing cyanosis or edema with positive pedal pulses.  Neuro: Alert, awake, oriented x3, nonfocal.   Lab Results:  Basic Metabolic Panel:    Component Value Date/Time   NA 136 11/07/2011 0622   K 4.0 11/07/2011 0622   CL 94* 11/07/2011 0622   CO2 29 11/07/2011 0622   BUN 27* 11/07/2011 0622   CREATININE 4.90* 11/07/2011 0622   GLUCOSE 172* 11/07/2011 0622   CALCIUM 10.2 11/07/2011 0622   CBC:    Component Value Date/Time   WBC 9.4 11/07/2011 0622   HGB 11.8* 11/07/2011 0622   HCT 36.3 11/07/2011 0622   PLT 263 11/07/2011 0622   MCV 93.3 11/07/2011 0622   NEUTROABS 6.8 11/03/2011 2232   LYMPHSABS 0.8 11/03/2011 2232   MONOABS 0.4 11/03/2011 2232   EOSABS 0.0 11/03/2011 2232   BASOSABS 0.0 11/03/2011 2232    Recent Results (from the past 240 hour(s))  MRSA PCR SCREENING     Status: Normal   Collection Time   11/04/11  2:56 AM      Component Value Range Status Comment   MRSA by PCR NEGATIVE  NEGATIVE  Final     Studies/Results: No results found.  Medications: Scheduled Meds:   . amLODipine  10 mg Oral QHS  . aspirin EC  325 mg Oral Daily  . ceFEPime (MAXIPIME) IV  1 g Intravenous QHS  . darbepoetin (ARANESP) injection - DIALYSIS  12.5 mcg Intravenous Q Mon-HD  . doxazosin  8 mg Oral QHS  . enoxaparin  30 mg Subcutaneous Daily  . ferric gluconate (FERRLECIT/NULECIT) IV  62.5 mg Intravenous Q Wed-HD  . hydrALAZINE  50 mg Oral Q8H  . insulin aspart  0-9 Units Subcutaneous TID WC  . isosorbide mononitrate  30 mg Oral Daily  . labetalol  400 mg Oral BID  . levofloxacin (LEVAQUIN) IV  500 mg Intravenous Q48H  . lisinopril  20 mg Oral QHS  . multivitamin  1 tablet Oral QHS  . pantoprazole  40 mg Oral Q1200  . paricalcitol  6 mcg Intravenous Q M,W,F-HD  . sevelamer  1,600 mg Oral TID WC  . simvastatin  20 mg Oral QPM  . sodium chloride  3 mL Intravenous Q12H  . vancomycin  750 mg Intravenous Q  M,W,F-HD  . white petrolatum       Continuous Infusions:  PRN Meds:.acetaminophen, acetaminophen, calcium carbonate (dosed in mg elemental calcium), camphor-menthol, docusate sodium, feeding supplement (NEPRO CARB STEADY), guaiFENesin-dextromethorphan, heparin, hydrOXYzine, labetalol, lidocaine, lidocaine-prilocaine, ondansetron (ZOFRAN) IV, ondansetron, pentafluoroprop-tetrafluoroeth, sorbitol, zolpidem, DISCONTD: alum & mag hydroxide-simeth, DISCONTD: heparin, DISCONTD: heparin, DISCONTD: ondansetron (ZOFRAN) IV DISCONTD: ondansetron, DISCONTD: promethazine  Assessment/Plan:  Assessment/  66 year-old female with history of ESRD on hemodialysis on Monday Wednesday and Friday, history of hypertension and diabetes mellitus type 2 presented to the ER because of ongoing chest pain with cough fever and chills over past 10 days with findings of PNA.   Plan:  Pneumonia  - continue IV antibiotics with Vancomycin, cefepime and Levaquin ( day 4) - NM V/Q scan showed  low probability for PE  - nebulizer treatments as needed for wheezing and shortness of breath  -symptoms improved  DM  - sliding scale insulin  - cont CBG monitoring   Positive Troponin  - ? demand ischemia , patient had chest pain on presentation which was pleuritic. She is on HD and would not follow troponins further   Hypertension  -secondary to non compliance/ fluid overload  - uncontrolled with multiple medications Norvasc, hydralazine, Imdur, labetalol and lisinopril  - slightly improved after HD   ESRD (end stage renal disease) on dialysis  Seen by PT and recommends CIR.  Seen by rehab and recommend appropriate for going home with services      LOS: 4 days   Khalen Styer 11/07/2011, 5:11 PM

## 2011-11-07 NOTE — Progress Notes (Signed)
ANTIBIOTIC CONSULT NOTE - FOLLOW UP  Pharmacy Consult for Vancomycin/Cefepime/Levaquin Indication: Empiric HCAP coverage  No Known Allergies  Patient Measurements: Height: 5\' 6"  (167.6 cm) Weight: 172 lb 4.8 oz (78.155 kg) IBW/kg (Calculated) : 59.3   Vital Signs: Temp: 97.7 F (36.5 C) (02/28 1245) Temp src: Oral (02/28 1245) BP: 131/42 mmHg (02/28 1245) Pulse Rate: 61  (02/28 1245) Intake/Output from previous day: 02/27 0701 - 02/28 0700 In: 631 [P.O.:480; IV Piggyback:150] Out: 2619  Intake/Output from this shift: Total I/O In: 120 [P.O.:120] Out: -   Labs:  Basename 11/07/11 0622 11/06/11 0908 11/05/11 0545  WBC 9.4 8.0 8.9  HGB 11.8* 11.2* 11.0*  PLT 263 229 214  LABCREA -- -- --  CREATININE 4.90* 7.14* 4.93*   Estimated Creatinine Clearance: 11.9 ml/min (by C-G formula based on Cr of 4.9). No results found for this basename: VANCOTROUGH:2,VANCOPEAK:2,VANCORANDOM:2,GENTTROUGH:2,GENTPEAK:2,GENTRANDOM:2,TOBRATROUGH:2,TOBRAPEAK:2,TOBRARND:2,AMIKACINPEAK:2,AMIKACINTROU:2,AMIKACIN:2, in the last 72 hours   Microbiology: Recent Results (from the past 720 hour(s))  MRSA PCR SCREENING     Status: Normal   Collection Time   11/04/11  2:56 AM      Component Value Range Status Comment   MRSA by PCR NEGATIVE  NEGATIVE  Final     Anti-infectives     Start     Dose/Rate Route Frequency Ordered Stop   11/06/11 2200   levofloxacin (LEVAQUIN) IVPB 500 mg        500 mg 100 mL/hr over 60 Minutes Intravenous Every 48 hours 11/05/11 0909     11/05/11 2200   levofloxacin (LEVAQUIN) IVPB 500 mg  Status:  Discontinued        500 mg 100 mL/hr over 60 Minutes Intravenous Every 48 hours 11/04/11 0222 11/05/11 0909   11/04/11 2200   ceFEPIme (MAXIPIME) 1 g in dextrose 5 % 50 mL IVPB        1 g 100 mL/hr over 30 Minutes Intravenous Daily at bedtime 11/04/11 0222     11/04/11 1200   vancomycin (VANCOCIN) 750 mg in sodium chloride 0.9 % 150 mL IVPB        750 mg 150 mL/hr over  60 Minutes Intravenous Every M-W-F (Hemodialysis) 11/04/11 0222     11/04/11 0400   Levofloxacin (LEVAQUIN) IVPB 750 mg        750 mg 100 mL/hr over 90 Minutes Intravenous  Once 11/04/11 0222 11/04/11 1727   11/04/11 0400   ceFEPIme (MAXIPIME) 1 g in dextrose 5 % 50 mL IVPB        1 g 100 mL/hr over 30 Minutes Intravenous  Once 11/04/11 0222 11/04/11 0633   11/04/11 0400   vancomycin (VANCOCIN) 500 mg in sodium chloride 0.9 % 100 mL IVPB        500 mg 100 mL/hr over 60 Minutes Intravenous  Once 11/04/11 0222 11/04/11 0757   11/03/11 2345  piperacillin-tazobactam (ZOSYN) IVPB 3.375 g       3.375 g 12.5 mL/hr over 240 Minutes Intravenous  Once 11/03/11 2334 11/04/11 0453   11/03/11 2345   vancomycin (VANCOCIN) IVPB 1000 mg/200 mL premix        1,000 mg 200 mL/hr over 60 Minutes Intravenous  Once 11/03/11 2334 11/04/11 0304          Assessment: 66 y.o. F with ESRD on Vancomycin + Cefepime + Levaquin for empiric HCAP coverage (failed Zithromax PTA). Today is total abx D#4. The patient is Afebrile, WBC WNL -- doses remain appropriate.  Goal of Therapy:  Pre-HD Vanc level of  15-25 mcg/ml  Plan:  1. Continue Vancomycin 750 mg post HD sessions 2. Continue Cefepime 1g IV every 24 hours 3. Continue Levaquin 500 mg every 28 hours 4. Consider de-escalating to Levaquin alone to complete total of 8 days of treatment 5. Will continue to follow HD schedule/duration, culture results, LOT, and antibiotic de-escalation plans   Georgina Pillion, PharmD, BCPS Clinical Pharmacist Pager: [phone] 11/07/2011 3:15 PM

## 2011-11-08 ENCOUNTER — Inpatient Hospital Stay (HOSPITAL_COMMUNITY): Payer: BC Managed Care – PPO

## 2011-11-08 DIAGNOSIS — J189 Pneumonia, unspecified organism: Secondary | ICD-10-CM | POA: Diagnosis present

## 2011-11-08 DIAGNOSIS — I10 Essential (primary) hypertension: Secondary | ICD-10-CM | POA: Diagnosis present

## 2011-11-08 DIAGNOSIS — R531 Weakness: Secondary | ICD-10-CM | POA: Diagnosis present

## 2011-11-08 LAB — RENAL FUNCTION PANEL
CO2: 25 mEq/L (ref 19–32)
GFR calc Af Amer: 7 mL/min — ABNORMAL LOW (ref >90)
Glucose, Bld: 262 mg/dL — ABNORMAL HIGH (ref 70–99)
Phosphorus: 3.5 mg/dL (ref 2.3–4.6)
Potassium: 4.2 mEq/L (ref 3.5–5.1)
Sodium: 132 mEq/L — ABNORMAL LOW (ref 135–145)

## 2011-11-08 LAB — CBC
Hemoglobin: 11.3 g/dL — ABNORMAL LOW (ref 12.0–15.0)
MCHC: 32.8 g/dL (ref 30.0–36.0)
RBC: 3.72 MIL/uL — ABNORMAL LOW (ref 3.87–5.11)

## 2011-11-08 MED ORDER — PARICALCITOL 5 MCG/ML IV SOLN
INTRAVENOUS | Status: AC
  Administered 2011-11-08: 6 ug via INTRAVENOUS
  Filled 2011-11-08: qty 2

## 2011-11-08 MED ORDER — LEVOFLOXACIN 500 MG PO TABS: 500.0000 mg | ORAL_TABLET | ORAL | Status: AC

## 2011-11-08 MED ORDER — SEVELAMER CARBONATE 800 MG PO TABS: 1600.0000 mg | ORAL_TABLET | Freq: Three times a day (TID) | ORAL | Status: AC

## 2011-11-08 MED ORDER — LEVOFLOXACIN 500 MG PO TABS
500.0000 mg | ORAL_TABLET | ORAL | Status: DC
  Filled 2011-11-08: qty 1

## 2011-11-08 MED ORDER — NEPRO/CARBSTEADY PO LIQD: 237.0000 mL | Freq: Three times a day (TID) | ORAL | Status: DC | PRN

## 2011-11-08 MED ORDER — DOXAZOSIN MESYLATE 8 MG PO TABS: 8.0000 mg | ORAL_TABLET | Freq: Every day | ORAL | Status: DC

## 2011-11-08 MED ORDER — HYDRALAZINE HCL 25 MG PO TABS: 50.0000 mg | ORAL_TABLET | Freq: Three times a day (TID) | ORAL | Status: DC

## 2011-11-08 NOTE — Progress Notes (Signed)
PT Cancellation Note  Treatment cancelled today due to patient had just returned from dialysis and was eating lunch and declined ambulation at this time. Will see if anyone will be able to check back on patient later on this PM if able.  Fredrich Birks 11/08/2011, 1:28 PM 11/08/2011 Fredrich Birks PTA [phone] pager [phone] office

## 2011-11-08 NOTE — Progress Notes (Signed)
   CARE MANAGEMENT NOTE 11/08/2011  Patient:  Briana Gould, Briana Gould   Account Number:  [id number]  Date Initiated:  11/06/2011  Documentation initiated by:  Donn Pierini  Subjective/Objective Assessment:   Pt admitted with PNA     Action/Plan:   PTA pt lived with spouse at home, PT eval ordered   Anticipated DC Date:  11/08/2011   Anticipated DC Plan:  HOME/SELF CARE      DC Planning Services  CM consult      [facility] Choice  HOME HEALTH   Choice offered to / List presented to:  C-1 Patient        HH arranged  HH-2 PT  HH-3 OT      HH agency  [facility]   Status of service:  Completed, signed off Medicare Important Message given?   (If response is "NO", the following Medicare IM given date fields will be blank) Date Medicare IM given:   Date Additional Medicare IM given:    Discharge Disposition:  HOME W HOME HEALTH SERVICES  Per UR Regulation:    Comments:  PCP- Margaretmary Bayley  11/08/11 15:32 Letha Cape RN, BSN [phone] Patient chose [facility] for HHPT/OT, referral made to [facility], Debbie notified.  Patient for dc today, soc will began 24-48 hrs post dc.  11/06/11 1115- Donn Pierini RN, BSN [phone] Attempted to see pt- pt in HD. Per PT notes recommend CIR. MD to order CIR consult. CM will follow up on d/c plans and needs.

## 2011-11-08 NOTE — Progress Notes (Signed)
Patient off unit

## 2011-11-08 NOTE — Progress Notes (Signed)
PHARMACIST - PHYSICIAN COMMUNICATION DR:   Dhungel CONCERNING: Antibiotic IV to Oral Route Change Policy  RECOMMENDATION: This patient is receiving Levaquin by the intravenous route.  Based on criteria approved by the Pharmacy and Therapeutics Committee, the antibiotic(s) is/are being converted to the equivalent oral dose form(s).   DESCRIPTION: These criteria include:  Patient being treated for a respiratory tract infection, urinary tract infection, or cellulitis  The patient is not neutropenic and does not exhibit a GI malabsorption state  The patient is eating (either orally or via tube) and/or has been taking other orally administered medications for a least 24 hours  The patient is improving clinically and has a Tmax < 100.5  If you have questions about this conversion, please contact the Pharmacy Department  []   [phone] )  [facility] [x]   [phone] )  [facility]  []   [phone] )  [facility] []   [phone] )  [facility]    Georgina Pillion, PharmD, BCPS 11/08/2011 9:55 AM

## 2011-11-08 NOTE — Progress Notes (Signed)
PT Cancellation Note  Treatment cancelled today due to patient receiving procedure or test. Patient in HD. Will attempt later as able.   Fredrich Birks 11/08/2011, 9:50 AM 11/08/2011 Fredrich Birks PTA [phone] pager [phone] office

## 2011-11-08 NOTE — Discharge Summary (Signed)
[name] MRN: [id number] DOB/AGE: 09/24/1945 66 y.o.  Admit date: 11/03/2011 Discharge date: 11/08/2011  Primary Care Physician:  [name], MD, MD  Discharge Diagnoses:     Principal Problem:  *healthcare associatedl Pneumonia  Active Problems:  Uncontrolled hypertension  CAD  ESRD (end stage renal disease) on dialysis  DM  BKA, Left leg  Weakness generalized   Medication List  As of 11/08/2011  1:46 PM   STOP taking these medications         azithromycin 250 MG tablet         TAKE these medications         amLODipine 10 MG tablet   Commonly known as: NORVASC   Take 10 mg by mouth daily.      doxazosin 8 MG tablet   Commonly known as: CARDURA   Take 1 tablet (8 mg total) by mouth at bedtime.      feeding supplement (NEPRO CARB STEADY) Liqd   Take 237 mLs by mouth 3 (three) times daily as needed (Supplement).      hydrALAZINE 25 MG tablet   Commonly known as: APRESOLINE   Take 2 tablets (50 mg total) by mouth 3 (three) times daily.      labetalol 200 MG tablet   Commonly known as: NORMODYNE   Take 200 mg by mouth 2 (two) times daily.      levofloxacin 500 MG tablet   Commonly known as: LEVAQUIN   Take 1 tablet (500 mg total) by mouth every other day.      sevelamer 800 MG tablet   Commonly known as: RENVELA   Take 2 tablets (1,600 mg total) by mouth 3 (three) times daily with meals.      simvastatin 20 MG tablet   Commonly known as: ZOCOR   Take 20 mg by mouth every evening.            Disposition and Follow-up:  F/UP with PCP in 1 week Follow up with HD as outpt  Consults:  [facility]  Significant Diagnostic Studies:  Dg Chest 2 View  11/03/2011  *RADIOLOGY REPORT*  Clinical Data: Cough and fever; hypertension.  CHEST - 2 VIEW  Comparison: Chest radiograph performed 10/31/2011  Findings: The lungs are well-aerated.  Vascular congestion is noted.  There are mildly increased interstitial markings; this could  reflect minimal interstitial edema, or given clinical concern, mild pneumonia could conceivably have a similar appearance.  There is no evidence of pleural effusion or pneumothorax.  The heart is mildly enlarged, stable from the prior study; hilar prominence remains stable from prior studies.  No acute osseous abnormalities are seen.  IMPRESSION: Vascular congestion and mild cardiomegaly noted; mildly increased interstitial markings could reflect minimal interstitial edema, or given clinical concern, mild pneumonia could conceivably have a similar appearance.  Original Report Authenticated By: Tonia Ghent, M.D.    Brief H and P: For complete details please refer to admission H and P, but in brief 66 year-old female with history of ESRD on hemodialysis on Monday Wednesday and Friday, history of hypertension and diabetes mellitus type 2 presented to the ER because of ongoing chest pain with cough fever and chills over the last week and a half. Patient had originally come 4 days ago to the ER with similar complaints and at that time patient was discharged home on Zithromax. Despite taking Zithromax patient still has cough with subjective feeling of fever chills and persistent chest pain. The chest pain is  retrosternal and it is usually associated cough and deep breath. Patient denies any palpitation nausea or diaphoresis or any exertional symptoms. Patient denies missing any dialysis. Patient denies any nausea vomiting abdominal pain dysuria discharges or diarrhea. Patient was found to be febrile in the ER and chest x-ray was showing congestion   Physical Exam on Discharge:  Filed Vitals:   11/08/11 0955 11/08/11 1000 11/08/11 1030 11/08/11 1100  BP: 98/50 123/55 155/65 121/47  Pulse: 63 58 61 63  Temp:      TempSrc:      Resp:      Height:      Weight:      SpO2:         Intake/Output Summary (Last 24 hours) at 11/08/11 1346 Last data filed at 11/08/11 1000  Gross per 24 hour  Intake    410  ml  Output      0 ml  Net    410 ml    General:elderly female in no acute distress.  HEENT: no pallor, no icterus, moist oral mucosa, no JVD, no lymphadenopathy  Heart: Normal s1 &s2 Regular rate and rhythm, without murmurs, rubs, gallops.  Lungs: Clear to auscultation bilaterally.  Abdomen: Soft, nontender, nondistended, positive bowel sounds.  Extremities: left BKA, No clubbing cyanosis or edema with positive pedal pulses.  Neuro: Alert, awake, oriented x3, nonfocal.  CBC:    Component Value Date/Time   WBC 9.2 11/08/2011 0850   HGB 11.3* 11/08/2011 0850   HCT 34.5* 11/08/2011 0850   PLT 273 11/08/2011 0850   MCV 92.7 11/08/2011 0850   NEUTROABS 6.8 11/03/2011 2232   LYMPHSABS 0.8 11/03/2011 2232   MONOABS 0.4 11/03/2011 2232   EOSABS 0.0 11/03/2011 2232   BASOSABS 0.0 11/03/2011 2232    Basic Metabolic Panel:    Component Value Date/Time   NA 132* 11/08/2011 0850   K 4.2 11/08/2011 0850   CL 91* 11/08/2011 0850   CO2 25 11/08/2011 0850   BUN 46* 11/08/2011 0850   CREATININE 6.82* 11/08/2011 0850   GLUCOSE 262* 11/08/2011 0850   CALCIUM 9.8 11/08/2011 0850    Hospital Course:   Healthcare associated Pneumonia  - patient started on IV antibiotics with Vancomycin, cefepime and Levaquin and will be discharged on po Levaquin to complete a 7 day course  - nebulizer treatments given for wheezing and shortness of breath  -symptoms now improved    Positive Troponin  - unlikely for  demand ischemia , patient had chest pain on presentation which was pleuritic in nature . No EKG changes. NM V/Q scan showed low probability for PE  and chest discomfort improved with improvement in her pneumonia.  Uncontrolled Hypertension  -secondary to non compliance/ fluid overload   improved after HD and medications adjusted. Will increase dose of hydralazine. Cont labetalol and amlodipine. will add cardura on discharge  ESRD (end stage renal disease) on dialysis  Had scheduled HD here  patient c/o significant  weakness and fatigue . Seen by inpt rehab and was considered appropriate to go home with services. She feels much stronger now and can be discharged home with outpt follow up.       Time spent on Discharge: 45 minutes  Signed: Eddie North 11/08/2011, 1:46 PM

## 2011-11-11 LAB — GLUCOSE, CAPILLARY: Glucose-Capillary: 137 mg/dL — ABNORMAL HIGH (ref 70–99)

## 2011-11-29 ENCOUNTER — Other Ambulatory Visit: Payer: Self-pay | Admitting: Cardiology

## 2011-12-10 ENCOUNTER — Other Ambulatory Visit: Payer: Self-pay | Admitting: Cardiology

## 2012-01-28 ENCOUNTER — Other Ambulatory Visit: Payer: Self-pay | Admitting: Cardiology

## 2012-11-20 ENCOUNTER — Telehealth: Payer: Self-pay

## 2012-11-20 NOTE — Telephone Encounter (Signed)
PT WOULD LIKE TO SPEAK TO CHELLE JEFFREY. WOULD NOT SAY WHY BUT WOULD LIKE CHELLE TO CALL HER BACK  [phone]

## 2012-11-20 NOTE — Telephone Encounter (Signed)
Called patient and she states Chelle called her and she is returning Chelles call/ Chelle did speak to her.

## 2013-03-08 ENCOUNTER — Other Ambulatory Visit: Payer: Self-pay | Admitting: Internal Medicine

## 2013-03-08 ENCOUNTER — Ambulatory Visit (HOSPITAL_COMMUNITY)
Admission: RE | Admit: 2013-03-08 | Discharge: 2013-03-08 | Disposition: A | Discharge status: Home / Self Care | Payer: BC Managed Care – PPO | Source: Ambulatory Visit | Attending: Internal Medicine | Admitting: Internal Medicine

## 2013-03-08 DIAGNOSIS — T1490XA Injury, unspecified, initial encounter: Secondary | ICD-10-CM

## 2013-03-08 DIAGNOSIS — M899 Disorder of bone, unspecified: Secondary | ICD-10-CM | POA: Insufficient documentation

## 2013-03-08 DIAGNOSIS — M79609 Pain in unspecified limb: Secondary | ICD-10-CM | POA: Insufficient documentation

## 2013-06-04 ENCOUNTER — Ambulatory Visit: Payer: Self-pay | Admitting: Vascular Surgery

## 2013-07-22 ENCOUNTER — Ambulatory Visit: Payer: Self-pay | Admitting: Vascular Surgery

## 2013-08-13 ENCOUNTER — Emergency Department (HOSPITAL_COMMUNITY): Payer: Medicare Other

## 2013-08-13 ENCOUNTER — Inpatient Hospital Stay (HOSPITAL_COMMUNITY)
Admission: EM | Admit: 2013-08-13 | Discharge: 2013-08-20 | DRG: 100 | Disposition: A | Discharge status: Home / Self Care | Payer: Medicare Other | Attending: Internal Medicine | Admitting: Internal Medicine

## 2013-08-13 ENCOUNTER — Encounter (HOSPITAL_COMMUNITY): Payer: Self-pay | Admitting: Emergency Medicine

## 2013-08-13 DIAGNOSIS — I2789 Other specified pulmonary heart diseases: Secondary | ICD-10-CM | POA: Diagnosis present

## 2013-08-13 DIAGNOSIS — I82C19 Acute embolism and thrombosis of unspecified internal jugular vein: Secondary | ICD-10-CM | POA: Diagnosis present

## 2013-08-13 DIAGNOSIS — N186 End stage renal disease: Secondary | ICD-10-CM | POA: Diagnosis not present

## 2013-08-13 DIAGNOSIS — I6509 Occlusion and stenosis of unspecified vertebral artery: Secondary | ICD-10-CM | POA: Diagnosis not present

## 2013-08-13 DIAGNOSIS — R4189 Other symptoms and signs involving cognitive functions and awareness: Secondary | ICD-10-CM | POA: Diagnosis not present

## 2013-08-13 DIAGNOSIS — I120 Hypertensive chronic kidney disease with stage 5 chronic kidney disease or end stage renal disease: Secondary | ICD-10-CM | POA: Diagnosis not present

## 2013-08-13 DIAGNOSIS — I82890 Acute embolism and thrombosis of other specified veins: Secondary | ICD-10-CM

## 2013-08-13 DIAGNOSIS — R404 Transient alteration of awareness: Secondary | ICD-10-CM | POA: Diagnosis not present

## 2013-08-13 DIAGNOSIS — I469 Cardiac arrest, cause unspecified: Secondary | ICD-10-CM | POA: Diagnosis not present

## 2013-08-13 DIAGNOSIS — I350 Nonrheumatic aortic (valve) stenosis: Secondary | ICD-10-CM | POA: Diagnosis present

## 2013-08-13 DIAGNOSIS — I6529 Occlusion and stenosis of unspecified carotid artery: Secondary | ICD-10-CM | POA: Diagnosis not present

## 2013-08-13 DIAGNOSIS — S88119A Complete traumatic amputation at level between knee and ankle, unspecified lower leg, initial encounter: Secondary | ICD-10-CM | POA: Diagnosis not present

## 2013-08-13 DIAGNOSIS — M899 Disorder of bone, unspecified: Secondary | ICD-10-CM | POA: Diagnosis not present

## 2013-08-13 DIAGNOSIS — I059 Rheumatic mitral valve disease, unspecified: Secondary | ICD-10-CM | POA: Diagnosis not present

## 2013-08-13 DIAGNOSIS — E876 Hypokalemia: Secondary | ICD-10-CM | POA: Diagnosis present

## 2013-08-13 DIAGNOSIS — D696 Thrombocytopenia, unspecified: Secondary | ICD-10-CM | POA: Diagnosis not present

## 2013-08-13 DIAGNOSIS — I953 Hypotension of hemodialysis: Secondary | ICD-10-CM | POA: Diagnosis present

## 2013-08-13 DIAGNOSIS — Z96659 Presence of unspecified artificial knee joint: Secondary | ICD-10-CM

## 2013-08-13 DIAGNOSIS — I2510 Atherosclerotic heart disease of native coronary artery without angina pectoris: Secondary | ICD-10-CM | POA: Diagnosis not present

## 2013-08-13 DIAGNOSIS — I959 Hypotension, unspecified: Secondary | ICD-10-CM | POA: Diagnosis present

## 2013-08-13 DIAGNOSIS — R55 Syncope and collapse: Secondary | ICD-10-CM

## 2013-08-13 DIAGNOSIS — G253 Myoclonus: Secondary | ICD-10-CM | POA: Diagnosis present

## 2013-08-13 DIAGNOSIS — D649 Anemia, unspecified: Secondary | ICD-10-CM | POA: Diagnosis present

## 2013-08-13 DIAGNOSIS — R4020 Unspecified coma: Secondary | ICD-10-CM

## 2013-08-13 DIAGNOSIS — I080 Rheumatic disorders of both mitral and aortic valves: Secondary | ICD-10-CM | POA: Diagnosis present

## 2013-08-13 DIAGNOSIS — I679 Cerebrovascular disease, unspecified: Secondary | ICD-10-CM | POA: Diagnosis present

## 2013-08-13 DIAGNOSIS — I359 Nonrheumatic aortic valve disorder, unspecified: Secondary | ICD-10-CM | POA: Diagnosis not present

## 2013-08-13 DIAGNOSIS — R569 Unspecified convulsions: Principal | ICD-10-CM | POA: Diagnosis present

## 2013-08-13 DIAGNOSIS — Z87898 Personal history of other specified conditions: Secondary | ICD-10-CM

## 2013-08-13 DIAGNOSIS — Z992 Dependence on renal dialysis: Secondary | ICD-10-CM | POA: Diagnosis not present

## 2013-08-13 DIAGNOSIS — Z79899 Other long term (current) drug therapy: Secondary | ICD-10-CM

## 2013-08-13 DIAGNOSIS — N2581 Secondary hyperparathyroidism of renal origin: Secondary | ICD-10-CM | POA: Diagnosis present

## 2013-08-13 DIAGNOSIS — I739 Peripheral vascular disease, unspecified: Secondary | ICD-10-CM | POA: Diagnosis not present

## 2013-08-13 DIAGNOSIS — E119 Type 2 diabetes mellitus without complications: Secondary | ICD-10-CM | POA: Diagnosis present

## 2013-08-13 DIAGNOSIS — M4802 Spinal stenosis, cervical region: Secondary | ICD-10-CM | POA: Diagnosis not present

## 2013-08-13 DIAGNOSIS — I10 Essential (primary) hypertension: Secondary | ICD-10-CM | POA: Diagnosis present

## 2013-08-13 HISTORY — DX: Personal history of other medical treatment: Z92.89

## 2013-08-13 HISTORY — DX: Anemia, unspecified: D64.9

## 2013-08-13 HISTORY — DX: Type 2 diabetes mellitus without complications: E11.9

## 2013-08-13 LAB — CBC
HCT: 35.4 % — ABNORMAL LOW (ref 36.0–46.0)
MCH: 32 pg (ref 26.0–34.0)
MCHC: 32.8 g/dL (ref 30.0–36.0)
RDW: 15 % (ref 11.5–15.5)
WBC: 6.7 10*3/uL (ref 4.0–10.5)

## 2013-08-13 LAB — COMPREHENSIVE METABOLIC PANEL
ALT: 16 U/L (ref 0–35)
AST: 23 U/L (ref 0–37)
Albumin: 3.8 g/dL (ref 3.5–5.2)
Alkaline Phosphatase: 85 U/L (ref 39–117)
Glucose, Bld: 176 mg/dL — ABNORMAL HIGH (ref 70–99)
Potassium: 4.3 mEq/L (ref 3.5–5.1)
Sodium: 136 mEq/L (ref 135–145)
Total Protein: 7.4 g/dL (ref 6.0–8.3)

## 2013-08-13 LAB — CBC WITH DIFFERENTIAL/PLATELET
Basophils Absolute: 0 10*3/uL (ref 0.0–0.1)
Basophils Relative: 0 % (ref 0–1)
Hemoglobin: 11 g/dL — ABNORMAL LOW (ref 12.0–15.0)
MCHC: 32.7 g/dL (ref 30.0–36.0)
Monocytes Relative: 5 % (ref 3–12)
Neutro Abs: 6.2 10*3/uL (ref 1.7–7.7)
Neutrophils Relative %: 75 % (ref 43–77)
Platelets: 89 10*3/uL — ABNORMAL LOW (ref 150–400)
Smear Review: DECREASED

## 2013-08-13 LAB — CREATININE, SERUM
Creatinine, Ser: 8.69 mg/dL — ABNORMAL HIGH (ref 0.50–1.10)
GFR calc non Af Amer: 4 mL/min — ABNORMAL LOW (ref >90)

## 2013-08-13 LAB — GLUCOSE, CAPILLARY: Glucose-Capillary: 105 mg/dL — ABNORMAL HIGH (ref 70–99)

## 2013-08-13 MED ORDER — SODIUM CHLORIDE 0.9 % IV SOLN: 250.0000 mL | INTRAVENOUS | Status: DC | PRN

## 2013-08-13 MED ORDER — DOXAZOSIN MESYLATE 8 MG PO TABS
8.0000 mg | ORAL_TABLET | Freq: Every day | ORAL | Status: DC
  Administered 2013-08-13 – 2013-08-19 (×6): 8 mg via ORAL
  Filled 2013-08-13 (×8): qty 1

## 2013-08-13 MED ORDER — AMLODIPINE BESYLATE 10 MG PO TABS
10.0000 mg | ORAL_TABLET | Freq: Every day | ORAL | Status: DC
  Administered 2013-08-13 – 2013-08-20 (×8): 10 mg via ORAL
  Filled 2013-08-13 (×8): qty 1

## 2013-08-13 MED ORDER — LABETALOL HCL 200 MG PO TABS
200.0000 mg | ORAL_TABLET | Freq: Two times a day (BID) | ORAL | Status: DC
  Administered 2013-08-13 – 2013-08-15 (×4): 200 mg via ORAL
  Filled 2013-08-13 (×5): qty 1

## 2013-08-13 MED ORDER — WHITE PETROLATUM GEL
Status: AC
  Administered 2013-08-13: 21:00:00
  Filled 2013-08-13: qty 5

## 2013-08-13 MED ORDER — ONDANSETRON HCL 4 MG PO TABS: 4.0000 mg | ORAL_TABLET | Freq: Four times a day (QID) | ORAL | Status: DC | PRN

## 2013-08-13 MED ORDER — LORAZEPAM 2 MG/ML IJ SOLN: 1.0000 mg | Freq: Four times a day (QID) | INTRAMUSCULAR | Status: DC | PRN

## 2013-08-13 MED ORDER — SODIUM CHLORIDE 0.9 % IJ SOLN
3.0000 mL | Freq: Two times a day (BID) | INTRAMUSCULAR | Status: DC
  Administered 2013-08-13 – 2013-08-19 (×6): 3 mL via INTRAVENOUS

## 2013-08-13 MED ORDER — SEVELAMER CARBONATE 800 MG PO TABS
800.0000 mg | ORAL_TABLET | Freq: Three times a day (TID) | ORAL | Status: DC
  Administered 2013-08-14 – 2013-08-16 (×6): 800 mg via ORAL
  Filled 2013-08-13 (×14): qty 1

## 2013-08-13 MED ORDER — ACETAMINOPHEN 325 MG PO TABS
650.0000 mg | ORAL_TABLET | Freq: Four times a day (QID) | ORAL | Status: DC | PRN
  Administered 2013-08-14 – 2013-08-19 (×6): 650 mg via ORAL
  Filled 2013-08-13 (×6): qty 2

## 2013-08-13 MED ORDER — SODIUM CHLORIDE 0.9 % IJ SOLN
3.0000 mL | Freq: Two times a day (BID) | INTRAMUSCULAR | Status: DC
  Administered 2013-08-13 – 2013-08-19 (×6): 3 mL via INTRAVENOUS

## 2013-08-13 MED ORDER — ALBUTEROL SULFATE (5 MG/ML) 0.5% IN NEBU: 2.5000 mg | INHALATION_SOLUTION | RESPIRATORY_TRACT | Status: DC | PRN

## 2013-08-13 MED ORDER — SODIUM CHLORIDE 0.9 % IJ SOLN: 3.0000 mL | INTRAMUSCULAR | Status: DC | PRN

## 2013-08-13 MED ORDER — ONDANSETRON HCL 4 MG/2ML IJ SOLN
4.0000 mg | Freq: Four times a day (QID) | INTRAMUSCULAR | Status: DC | PRN
  Administered 2013-08-16: 4 mg via INTRAVENOUS
  Filled 2013-08-13 (×2): qty 2

## 2013-08-13 MED ORDER — ACETAMINOPHEN 650 MG RE SUPP: 650.0000 mg | Freq: Four times a day (QID) | RECTAL | Status: DC | PRN

## 2013-08-13 MED ORDER — LISINOPRIL 40 MG PO TABS
40.0000 mg | ORAL_TABLET | Freq: Every day | ORAL | Status: DC
  Administered 2013-08-13 – 2013-08-19 (×7): 40 mg via ORAL
  Filled 2013-08-13 (×8): qty 1

## 2013-08-13 MED ORDER — RENA-VITE PO TABS
1.0000 | ORAL_TABLET | Freq: Every day | ORAL | Status: DC
  Administered 2013-08-13 – 2013-08-19 (×6): 1 via ORAL
  Filled 2013-08-13 (×8): qty 1

## 2013-08-13 MED ORDER — HEPARIN SODIUM (PORCINE) 5000 UNIT/ML IJ SOLN
5000.0000 [IU] | Freq: Three times a day (TID) | INTRAMUSCULAR | Status: DC
  Administered 2013-08-13 – 2013-08-15 (×5): 5000 [IU] via SUBCUTANEOUS
  Filled 2013-08-13 (×9): qty 1

## 2013-08-13 MED ORDER — HYDRALAZINE HCL 50 MG PO TABS
50.0000 mg | ORAL_TABLET | Freq: Three times a day (TID) | ORAL | Status: DC
  Administered 2013-08-13 – 2013-08-20 (×16): 50 mg via ORAL
  Filled 2013-08-13 (×23): qty 1

## 2013-08-13 NOTE — H&P (Addendum)
PATIENT DETAILS Name: Raihana Swanstonvaldes Age: 67 y.o. Sex: female Date of Birth: [birth year] Admit Date: 08/13/2013 ZOX:[id number] S, MD   CHIEF COMPLAINT:  ? Seizures versus syncope  HPI: Samiya Sandoz is a 67 y.o. 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 Apparently, today just 10-15 minutes into dialysis, patient felt lightheaded, and was noted to have generalized tightness and some "seizure like activity". Patient claims that she had urinary incontinence associated with this. There was no tongue biting. Apparently from the history obtained from the emergency department physician, apparently her blood pressure was low-during this episode. She also claims, to have vomited once during this episode. She is not sure whether she lost consciousness or not, but claims that she was not completely "with it" during this entire episode. There is no documentation of any post ictal state. There is no documentation as to exactly what the blood pressure readings were. She was then transferred to the emergency room, I was subsequently asked to admit this patient for further evaluation and treatment. Upon further questioning, patient had a similar episode this past Wednesday. She claims that at dialysis, did not remove a lot of fluid on Wednesday and she was not back to her usual dry weight. She claims that today before starting dialysis her weight was 89 KGs, her dry weight is close to 81 KG. She is now have a history of having prior syncope or seizures. There is no history of fever, headache, neck pain, aspirin, shortness of breath, There is also no history of diarrhea, abdominal pain.  ALLERGIES:   Allergies  Allergen Reactions  . Phenergan [Promethazine Hcl] Other (See Comments)    Causes patient to" vomit more"   . Vitamin D Analogs Rash    PAST MEDICAL HISTORY: Past Medical History  Diagnosis Date  . End stage renal disease on dialysis   . Diabetes mellitus   . Hypertension   . PAD (peripheral artery disease)     PAST SURGICAL HISTORY: Past Surgical History  Procedure Laterality Date  . Av fistula placement    . Below knee leg amputation      MEDICATIONS AT HOME: Prior to Admission medications   Medication Sig Start Date End Date Taking? Authorizing Provider  amLODipine (NORVASC) 10 MG tablet Take 10 mg by mouth daily.   Yes Historical Provider, MD  b complex-vitamin c-folic acid (NEPHRO-VITE) 0.8 MG TABS tablet Take 1 tablet by mouth daily.   Yes Historical Provider, MD  doxazosin (CARDURA) 8 MG tablet Take 8 mg by mouth at bedtime.   Yes Historical Provider, MD  hydrALAZINE (APRESOLINE) 25 MG tablet Take 2 tablets (50 mg total) by mouth 3 (three) times daily. 11/08/11  Yes Nishant Dhungel, MD  labetalol (NORMODYNE) 200 MG tablet TAKE 1 TABLET BY MOUTH TWICE A DAY 11/29/11  Yes Luis Abed, MD  lisinopril (PRINIVIL,ZESTRIL) 40 MG tablet Take 40 mg by mouth daily.   Yes Historical Provider, MD  sevelamer carbonate (RENVELA) 800 MG tablet Take 800 mg by mouth 3 (three) times daily with meals.   Yes Historical Provider, MD  doxazosin (CARDURA) 8 MG tablet Take 1 tablet (8 mg total) by mouth at bedtime. 11/08/11 11/07/12  Nishant Dhungel, MD  labetalol (NORMODYNE) 200 MG tablet Take 200 mg by mouth 2 (two) times daily. 10/01/11 09/30/12  Luis Abed, MD    FAMILY HISTORY:  No family history on file.  SOCIAL HISTORY:  reports that she has never smoked. She does not have any smokeless tobacco history on file. She reports that she does not drink alcohol or use illicit drugs.  REVIEW OF SYSTEMS:  Constitutional:   No  weight loss, night sweats,  Fevers, chills, fatigue.  HEENT:    No headaches, Difficulty swallowing,Tooth/dental problems,Sore throat,  No sneezing, itching, ear ache, nasal  congestion, post nasal drip,   Cardio-vascular: No chest pain,  Orthopnea, PND, swelling in lower extremities, anasarca, dizziness, palpitations  GI:  No heartburn, indigestion, abdominal pain, nausea, vomiting, diarrhea, change in  bowel habits, loss of appetite  Resp: No shortness of breath with exertion or at rest.  No excess mucus, no productive cough, No non-productive cough,  No coughing up of blood.No change in color of mucus.No wheezing.No chest wall deformity  Skin:  no rash or lesions.  GU:  no dysuria, change in color of urine, no urgency or frequency.  No flank pain.  Musculoskeletal: No joint pain or swelling.  No decreased range of motion.  No back pain.  Psych: No change in mood or affect. No depression or anxiety.  No memory loss.   PHYSICAL EXAM: Blood pressure 175/49, pulse 58, temperature 97.7 F (36.5 C), temperature source Oral, resp. rate 16, weight 85.276 kg (188 lb), SpO2 97.00%.  General appearance :Awake, alert, not in any distress. Speech Clear. Not toxic Looking HEENT: Atraumatic and Normocephalic, pupils equally reactive to light and accomodation Neck: supple, no JVD. No cervical lymphadenopathy.  Chest:Good air entry bilaterally, no added sounds  CVS: S1 S2 regular, no murmurs.  Abdomen: Bowel sounds present, Non tender and not distended with no gaurding, rigidity or rebound. Extremities: She is status post left BKA. Right leg is warm to touch. No edema noted.  Neurology: Awake alert, and oriented X 3, CN II-XII intact, Non focal Skin:No Rash Wounds:N/A  LABS ON ADMISSION:   Recent Labs  08/13/13 0855  NA 136  K 4.3  CL 93*  CO2 26  GLUCOSE 176*  BUN 55*  CREATININE 8.32*  CALCIUM 10.2    Recent Labs  08/13/13 0855  AST 23  ALT 16  ALKPHOS 85  BILITOT 0.4  PROT 7.4  ALBUMIN 3.8   No results found for this basename: LIPASE, AMYLASE,  in the last 72 hours  Recent Labs  08/13/13 0855  WBC 8.2  NEUTROABS 6.2  HGB 11.0*   HCT 33.6*  MCV 98.0  PLT 89*    Recent Labs  08/13/13 0855  TROPONINI <0.30   No results found for this basename: DDIMER,  in the last 72 hours No components found with this basename: POCBNP,    RADIOLOGIC STUDIES ON ADMISSION: Dg Chest 1 View  08/13/2013   CLINICAL DATA:  67 year old female with acute onset diffusion shortness of breath and chest pain while in dialysis. Initial encounter.  EXAM: CHEST - 1 VIEW  COMPARISON:  11/03/2011 and earlier.  FINDINGS: Portable AP upright view at 0923 hrs. Right IJ approach dual lumen dialysis catheter in place. Stable lung volumes. Stable cardiomegaly and mediastinal contours. No pneumothorax, pulmonary edema, pleural effusion or consolidation. Overall decreased pulmonary vascularity compared to the previous exam. Multiple wires an EKG leads overlie the chest.  IMPRESSION: No acute cardiopulmonary abnormality.   Electronically Signed   By: Augusto Gamble M.D.   On: 08/13/2013 10:06   Ct Head Wo Contrast  08/13/2013   CLINICAL DATA:  Loss of consciousness,  seizure.  EXAM: CT HEAD WITHOUT CONTRAST  TECHNIQUE: Contiguous axial images were obtained from the base of the skull through the vertex without intravenous contrast.  COMPARISON:  August 29, 2010.  FINDINGS: Bony calvarium appears intact. Diffuse cortical atrophy is noted. No mass effect or midline shift is noted. Ventricular size is within normal limits. There is no evidence of mass lesion, hemorrhage or acute infarction.  IMPRESSION: Mild diffuse cortical atrophy. No acute intracranial abnormality seen.   Electronically Signed   By: Roque Lias M.D.   On: 08/13/2013 09:58     EKG: Independently reviewed. LBBB pattern- with artifacts.  ASSESSMENT AND PLAN: Present on Admission:  . Seizure versus syncope/presyncope  - Patient will be admitted to the telemetry unit, we will need further collaborative history from the dialysis Center. She will be transferred to [facility], MRI brain, EEG  and a 2-D echo cardiogram will be obtained. Apparently her blood pressure was "low" while at dialysis, however in the ED BP is elevated with the systolic in the 170s. For now we'll continue with all her usual antihypertensive medications with holding parameters. However she appears to be on 5 antihypertensive medications, may need further optimization of these medications she were truly hypotensive during dialysis. - Further plan will depend as patient's clinical course evolves, further studies and further evaluation by consultant physicians are obtained. -Nephrology will need to be consulted there, her primary nephrologist is Dr. Orlene Och nephrology can get more collaborative history/Data from the outpatient dialysis center.  - Case was discussed with neurology on call-Dr. Gomez Cleverly will see the patient when the patient arrives at [facility]. In the meantime, we will not start on any antiepileptic therapy, will provide as needed intravenous Ativan if any seizure like activity were to be noted.  - Case was also discussed with Dr. Gonzella Lex, accepting M.D. at [facility], he will notify nephrology of the patient's admission.   . ESRD (end stage renal disease) on dialysis - Patient has not completed her scheduled hemodialysis today, but patient, she was only 10-15 minutes into dialysis when it was terminated. She is currently stable, not short of breath, electrolytes stable as well, nephrology will need to be notified of the patient's admission to [facility] for dialysis needs. She will need to be monitored closely while getting dialysis as an inpatient to see if her symptoms recur.   . Thrombocytopenia - Appears to be new, will cautiously continue with heparin for DVT prophylaxis, if continues to drop, may need to send a HIT panel and discontinue heparin  . HYPERTENSION - As above, continue antihypertensive regimen.   Marland Kitchen PAD (peripheral artery disease) - Stable, status post  BKA   . PULMONARY HYPERTENSION - Seen on 2-D echocardiogram done on 2011. On Calcium channel blockers. - Await repeat echocardiogram  Further plan will depend as patient's clinical course evolves and further radiologic and laboratory data become available. Patient will be monitored closely.  DVT Prophylaxis: Prophylactic Heparin  Code Status: Full Code  Total time spent for admission equals 45 minutes.  [facility] Pager [phone]  If 7PM-7AM, please contact night-coverage [email or web address] Password [email or web address] 08/13/2013, 11:24 AM

## 2013-08-13 NOTE — ED Notes (Signed)
Informed of carelink ETA.

## 2013-08-13 NOTE — Progress Notes (Signed)
Patient seen and examined after arrival to the floor on [place]. denies any symptoms at present. vitals stable. Admission H&P reviewed. Renal and neurology consult notified.

## 2013-08-13 NOTE — ED Notes (Signed)
[place] dialysis rn will be down to remove access. Richa Shor

## 2013-08-13 NOTE — Consult Note (Signed)
[facility] Renal Consultation Note  Indication for Consultation:  Management of ESRD/hemodialysis; anemia, hypertension/volume and secondary hyperparathyroidism  HPI: Briana Gould is a 67 y.o. 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 She has a history of Hypertension and  Noncompliance with bp meds. By patient and hd RN history of Wed 08/11/13 HD seizure on hd thought related to bp drop with bp 160/99 pre and post  Bp= 192/78 with lowest bp recorded 123/58 on flow sheets/ she left 3 kg over edw refused to come back for extra hdtx / today pre hd wt 5.6 kg >edw and bp pre hd 184/85 standing and 163/81 sitting   bp drop to 69/54  And TX stopped sec to seizure type activity  And sent to ER.  Patient does report missing some of her meds and currently is no sob with her current wt 85.27 kg (edw= 81.0 kg) with cxr showing no pulmonary edema, but some pedal edema,and K= 4.3.CT of head showed NAD. Denies any aura , chills , fevers, chest pain. She does have a Right ij perm cath because of LUA AVF troubles cannulating in past with Hematoma that has resolved now.   Past Medical History  Diagnosis Date  . End stage renal disease on dialysis   . Diabetes mellitus   . Hypertension   . PAD (peripheral artery disease)     Past Surgical History  Procedure Laterality Date  . Av fistula placement    . Below knee leg amputation       No family history on file. Social = Lives with Daughter and   reports that she has never smoked. She does not have any smokeless tobacco history on file. She reports that she does not drink alcohol or use illicit drugs.   Allergies  Allergen Reactions  . Phenergan [Promethazine Hcl] Other (See Comments)    Causes patient to" vomit more"  . Vitamin D Analogs Rash    Prior to Admission medications   Medication Sig Start Date End Date Taking? Authorizing Provider  amLODipine (NORVASC) 10 MG tablet Take 10 mg by mouth daily.    Yes Historical Provider, MD  b complex-vitamin c-folic acid (NEPHRO-VITE) 0.8 MG TABS tablet Take 1 tablet by mouth daily.   Yes Historical Provider, MD  doxazosin (CARDURA) 8 MG tablet Take 8 mg by mouth at bedtime.   Yes Historical Provider, MD  hydrALAZINE (APRESOLINE) 25 MG tablet Take 2 tablets (50 mg total) by mouth 3 (three) times daily. 11/08/11  Yes Nishant Dhungel, MD  labetalol (NORMODYNE) 200 MG tablet TAKE 1 TABLET BY MOUTH TWICE A DAY 11/29/11  Yes Luis Abed, MD  lisinopril (PRINIVIL,ZESTRIL) 40 MG tablet Take 40 mg by mouth daily.   Yes Historical Provider, MD  sevelamer carbonate (RENVELA) 800 MG tablet Take 800 mg by mouth 3 (three) times daily with meals.   Yes Historical Provider, MD  doxazosin (CARDURA) 8 MG tablet Take 1 tablet (8 mg total) by mouth at bedtime. 11/08/11 11/07/12  Nishant Dhungel, MD  labetalol (NORMODYNE) 200 MG tablet Take 200 mg by mouth 2 (two) times daily. 10/01/11 09/30/12  Luis Abed, MD     Anti-infectives   None      Results for orders placed during the hospital encounter of 08/13/13 (from the past 48 hour(s))  CBC WITH DIFFERENTIAL     Status: Abnormal   Collection Time    08/13/13  8:55 AM      Result Value Range  WBC 8.2  4.0 - 10.5 K/uL   RBC 3.43 (*) 3.87 - 5.11 MIL/uL   Hemoglobin 11.0 (*) 12.0 - 15.0 g/dL   HCT 21.3 (*) 08.6 - 57.8 %   MCV 98.0  78.0 - 100.0 fL   MCH 32.1  26.0 - 34.0 pg   MCHC 32.7  30.0 - 36.0 g/dL   RDW 46.9  62.9 - 52.8 %   Platelets 89 (*) 150 - 400 K/uL   Comment: SPECIMEN CHECKED FOR CLOTS     PLATELET COUNT CONFIRMED BY SMEAR   Neutrophils Relative % 75  43 - 77 %   Neutro Abs 6.2  1.7 - 7.7 K/uL   Lymphocytes Relative 15  12 - 46 %   Lymphs Abs 1.3  0.7 - 4.0 K/uL   Monocytes Relative 5  3 - 12 %   Monocytes Absolute 0.4  0.1 - 1.0 K/uL   Eosinophils Relative 4  0 - 5 %   Eosinophils Absolute 0.3  0.0 - 0.7 K/uL   Basophils Relative 0  0 - 1 %   Basophils Absolute 0.0  0.0 - 0.1 K/uL   Smear  Review PLATELETS APPEAR DECREASED    COMPREHENSIVE METABOLIC PANEL     Status: Abnormal   Collection Time    08/13/13  8:55 AM      Result Value Range   Sodium 136  135 - 145 mEq/L   Potassium 4.3  3.5 - 5.1 mEq/L   Chloride 93 (*) 96 - 112 mEq/L   CO2 26  19 - 32 mEq/L   Glucose, Bld 176 (*) 70 - 99 mg/dL   BUN 55 (*) 6 - 23 mg/dL   Creatinine, Ser 4.13 (*) 0.50 - 1.10 mg/dL   Calcium 24.4  8.4 - 01.0 mg/dL   Total Protein 7.4  6.0 - 8.3 g/dL   Albumin 3.8  3.5 - 5.2 g/dL   AST 23  0 - 37 U/L   ALT 16  0 - 35 U/L   Alkaline Phosphatase 85  39 - 117 U/L   Total Bilirubin 0.4  0.3 - 1.2 mg/dL   GFR calc non Af Amer 4 (*) >90 mL/min   GFR calc Af Amer 5 (*) >90 mL/min   Comment: (NOTE)     The eGFR has been calculated using the CKD EPI equation.     This calculation has not been validated in all clinical situations.     eGFR's persistently <90 mL/min signify possible Chronic Kidney     Disease.  TROPONIN I     Status: None   Collection Time    08/13/13  8:55 AM      Result Value Range   Troponin I <0.30  <0.30 ng/mL   Comment:            Due to the release kinetics of cTnI,     a negative result within the first hours     of the onset of symptoms does not rule out     myocardial infarction with certainty.     If myocardial infarction is still suspected,     repeat the test at appropriate intervals.  GLUCOSE, CAPILLARY     Status: Abnormal   Collection Time    08/13/13  2:25 PM      Result Value Range   Glucose-Capillary 105 (*) 70 - 99 mg/dL   Comment 1 Documented in Chart     Comment 2 Notify RN  ROS: no positives except in hpi.   Physical Exam: Filed Vitals:   08/13/13 1430  BP: 163/76  Pulse: 60  Temp: 98.5 F (36.9 C)  Resp: 20     General: alert sl obese BF, NAD , appropriate HEENT: [place], mmm Eyes:  eomi , nonicteric Neck: supple, no jvd Heart:  RRR, soft 1/6 sem lsb, no rub or gallop Lungs:  CTA bilaterally Abdomen: Obese, soft ,nontender,  nondistended Extremities: L BKA stumpp no edema, 1 + R pedal edema Skin: No overt rash or ulcer Neuro: OX3, moves all extrem.  Dialysis Access: Pos. Bruit LUA AVF/ R IJ perm cath  Dialysis Orders: Center: [facility]  on MWF . EDW 81kg HD Bath 2.0 , 2.25ca  Time 3hrs Heparin 8000. Access LUA AVF RIJpermcath BFR 400 DFR 800    Zemplar 3 mcg IV/HD Epogen 0   Units IV/HD  Venofer  0   Assessment/Plan 1. Seizure Type Activity related to ? bp drop on HD / Hypertensive related seizure related= neuro to see / admit team wu/ with current wt and no vol on cxr /some with perdal edema but not >5 kg will raise her edw. 2. ESRD -  HD nl MWF volume okay today and K , HD in AM  3. Hypertension/volume  - bp meds and hd in am but attempt 2 to 3kg  Keep sbp >120 4. Anemia  - hgb .12 no epo or iron  5. Metabolic bone disease -  Binders and vit d/ fu am labs 6. Nutrition -  Renal diet / renal vit. 7. DM type 2 per admit 8. PAD remote L bka  Lenny Pastel, PA-C [facility] Beeper [phone] 08/13/2013, 3:10 PM   I have seen and examined patient, discussed with PA and agree with assessment and plan as outlined above. Vinson Moselle MD pager [phone]    cell [phone] 08/13/2013, 5:00 PM

## 2013-08-13 NOTE — ED Provider Notes (Signed)
CSN: [id number]     Arrival date & time 08/13/13  7829 History  This chart was scribed for Briana Octave, MD by Briana Gould, ED Scribe. This patient was seen in room APA19/APA19 and the patient's care was started 8:44 AM.    Chief Complaint  Patient presents with  . Seizures    The history is provided by the patient. No language interpreter was used.    HPI Comments: Briana Gould is a 67 y.o. female with past medical history of DM, HTN, PAD, end stage renal disease who presents to the Emergency Department complaining of seizure-like activity just PTA. Pt was about 30 minutes into dialysis when her blood pressure dropped and began to "stiffen". She denies a history of seizures. Daughter states pt had a seizure about 3 years ago when first starting dialysis. Daughter states pt had a similar drop in her blood pressure at her last dialysis appointment. When asked, pt states she remembers beginning her dialysis session but does not know what happened afterwards. She reports having some chest pain during dialysis but none currently. She also reports having a HA which is her main concerns right now. Pt states her BMs have been normal. She had 1 episode of emesis after coming to the ED. She does not take blood thinners. She denies abdominal pain, current chest pain, SOB.      Past Medical History  Diagnosis Date  . Hypertension   . PAD (peripheral artery disease)   . Type II diabetes mellitus   . Anemia   . History of blood transfusion 2002    "related to amputation" (08/13/2013)  . End stage renal disease on dialysis     "[facility]; M, W, F" (08/13/2013)   Past Surgical History  Procedure Laterality Date  . Av fistula placement Left 2011    upper arm  . Below knee leg amputation Left 2002  . Tonsillectomy    . Cataract extraction w/ intraocular lens  implant, bilateral Bilateral 2012-2014   History reviewed. No pertinent family history. History  Substance Use  Topics  . Smoking status: Never Smoker   . Smokeless tobacco: Never Used  . Alcohol Use: No   OB History   Grav Para Term Preterm Abortions TAB SAB Ect Mult Living                 Review of Systems A complete 10 system review of systems was obtained and all systems are negative except as noted in the HPI and PMH.   Allergies  Phenergan and Vitamin d analogs  Home Medications   No current outpatient prescriptions on file. BP 163/76  Pulse 60  Temp(Src) 98.5 F (36.9 C) (Oral)  Resp 20  Wt 188 lb (85.276 kg)  SpO2 98% Physical Exam  Nursing note and vitals reviewed. Constitutional: She is oriented to person, place, and time. She appears well-developed and well-nourished. No distress.  HENT:  Head: Normocephalic and atraumatic.  Mouth/Throat: Oropharynx is clear and moist. No oropharyngeal exudate.  Eyes: Conjunctivae and EOM are normal. Pupils are equal, round, and reactive to light.  Right pupil constricted and nonreactive. Left pupil is 3mm and sluggish.    Neck: Neck supple. No tracheal deviation present.  Cardiovascular: Normal rate and regular rhythm.   No murmur heard. Pulmonary/Chest: Effort normal and breath sounds normal. No respiratory distress. She has no wheezes. She has no rales. She exhibits no tenderness.  Abdominal: Soft. Bowel sounds are normal. She exhibits no distension. There  is no tenderness.  Musculoskeletal: Normal range of motion. She exhibits no edema and no tenderness.  Left BKA. AV fistula in left upper arm. Dialysis catheter in right chest. Moving extremities well.   Neurological: She is alert and oriented to person, place, and time. No cranial nerve deficit.  CN 2-12 intact, no ataxia on finger to nose, no nystagmus, 5/5 strength throughout, no pronator drift, Romberg negative, normal gait.   Skin: Skin is warm and dry.  Psychiatric: She has a normal mood and affect. Her behavior is normal.    ED Course  Procedures   DIAGNOSTIC  STUDIES: Oxygen Saturation is 100% on RA, normal by my interpretation.    COORDINATION OF CARE: 9:14 AM Will order head CT, CXR, CBC, CMP, Troponin, UA. Discussed treatment plan with pt at bedside and pt agreed to plan.  10:26 AM Upon recheck, pt states her HA is slightly improved but is now having discomfort in her arms from the IVs. Pt and family are updated on lab and imaging results.    Labs Review Labs Reviewed  CBC WITH DIFFERENTIAL - Abnormal; Notable for the following:    RBC 3.43 (*)    Hemoglobin 11.0 (*)    HCT 33.6 (*)    Platelets 89 (*)    All other components within normal limits  COMPREHENSIVE METABOLIC PANEL - Abnormal; Notable for the following:    Chloride 93 (*)    Glucose, Bld 176 (*)    BUN 55 (*)    Creatinine, Ser 8.32 (*)    GFR calc non Af Amer 4 (*)    GFR calc Af Amer 5 (*)    All other components within normal limits  GLUCOSE, CAPILLARY - Abnormal; Notable for the following:    Glucose-Capillary 105 (*)    All other components within normal limits  TROPONIN I  URINALYSIS, ROUTINE W REFLEX MICROSCOPIC  CBC  CREATININE, SERUM  TROPONIN I  TROPONIN I  TROPONIN I   Imaging Review Dg Chest 1 View  08/13/2013   CLINICAL DATA:  67 year old female with acute onset diffusion shortness of breath and chest pain while in dialysis. Initial encounter.  EXAM: CHEST - 1 VIEW  COMPARISON:  11/03/2011 and earlier.  FINDINGS: Portable AP upright view at 0923 hrs. Right IJ approach dual lumen dialysis catheter in place. Stable lung volumes. Stable cardiomegaly and mediastinal contours. No pneumothorax, pulmonary edema, pleural effusion or consolidation. Overall decreased pulmonary vascularity compared to the previous exam. Multiple wires an EKG leads overlie the chest.  IMPRESSION: No acute cardiopulmonary abnormality.   Electronically Signed   By: Augusto Gamble M.D.   On: 08/13/2013 10:06   Ct Head Wo Contrast  08/13/2013   CLINICAL DATA:  Loss of consciousness, seizure.   EXAM: CT HEAD WITHOUT CONTRAST  TECHNIQUE: Contiguous axial images were obtained from the base of the skull through the vertex without intravenous contrast.  COMPARISON:  August 29, 2010.  FINDINGS: Bony calvarium appears intact. Diffuse cortical atrophy is noted. No mass effect or midline shift is noted. Ventricular size is within normal limits. There is no evidence of mass lesion, hemorrhage or acute infarction.  IMPRESSION: Mild diffuse cortical atrophy. No acute intracranial abnormality seen.   Electronically Signed   By: Roque Lias M.D.   On: 08/13/2013 09:58    EKG Interpretation    Date/Time:    Ventricular Rate:    PR Interval:    QRS Duration:   QT Interval:    QTC Calculation:  R Axis:     Text Interpretation:              MDM   1. Syncope   2. Loss of consciousness   3. ESRD (end stage renal disease) on dialysis   4. Seizure   5. Uncontrolled hypertension    Questionable seizure activity during dialysis today. Associated with decrease in blood pressure. No history of seizures. Patient thinks she had urinary incontinence. Complains of headache now. She is oriented x3. Denies any chest pain or shortness of breath.  Unchanged left bundle branch block and EKG. Patient is hypertensive. She does not appear to be postictal. CT head is negative. Electrolytes did not show any hyperkalemia. She only had 10-15 minutes of dialysis today.  Syncope versus seizure during dialysis. Patient appears back to baseline now. Will need admission for observation. Discussed with Dr. Jerral Ralph who has arranged transfer to [facility] as there is no neurology coverage this weekend.     Date: 08/13/2013  Rate: 59  Rhythm: sinus bradycardia  QRS Axis: normal  Intervals: normal  ST/T Wave abnormalities: normal  Conduction Disutrbances:left bundle branch block  Narrative Interpretation:   Old EKG Reviewed: unchanged   I personally performed the services described in this  documentation, which was scribed in my presence. The recorded information has been reviewed and is accurate.        Briana Octave, MD 08/13/13 [id number]

## 2013-08-13 NOTE — ED Notes (Signed)
Patient left with [facility] at this time. No distress upon departure.

## 2013-08-13 NOTE — ED Notes (Signed)
Pt reportedly had seizure-like activity (described as "stiffness") after 30 min into dialysis, blood pressure decreased at that time also. Same occurred Wednesday during treatment. Pt vomited x 1 on arrival to ED. Dialysis access remains in place on arrival. CBG enroute was 198.

## 2013-08-13 NOTE — ED Notes (Signed)
Dialysis access removed by dialysis nurse. Nurse noted Arterial (AVF) infiltration. Ice placed on area, left arm elevated.

## 2013-08-13 NOTE — Progress Notes (Signed)
Pt placed on telemetry, box #26. Notified Chasidy with CCMD.

## 2013-08-13 NOTE — Consult Note (Signed)
NEURO HOSPITALIST CONSULT NOTE    Reason for Consult: syncope versus seizure during dialysis  HPI:                                                                                                                                          Briana Gould is an 67 y.o. female who was at dialysis today when she felt as though her blood pressure was dropping (no documentation of what her BP was).  During this episode she recalls people calling her name and feeling "out of it" but does not believe she fainted. She feels episode lasted for maybe 3 minutes and was fully alert after the episode.  Similar episode occurred once when she had just started dialysis and last Wednesday when she dropped her BP during dialysis. When this occurred on Wednesday, she noted they stopped removing fluid and she returned to her baseline. She denies prior seizure history herself or in her family. Currently she is back to her baseline.    Past Medical History  Diagnosis Date  . Hypertension   . PAD (peripheral artery disease)   . Type II diabetes mellitus   . Anemia   . History of blood transfusion 2002    "related to amputation" (08/13/2013)  . End stage renal disease on dialysis     "[facility]; M, W, F" (08/13/2013)    Past Surgical History  Procedure Laterality Date  . Av fistula placement Left 2011    upper arm  . Below knee leg amputation Left 2002  . Tonsillectomy    . Cataract extraction w/ intraocular lens  implant, bilateral Bilateral 2012-2014    Family History  Problem Relation Age of Onset  . Hypertension Mother   . Hypertension Father      Social History:  reports that she has never smoked. She has never used smokeless tobacco. She reports that she does not drink alcohol or use illicit drugs.  Allergies  Allergen Reactions  . Phenergan [Promethazine Hcl] Other (See Comments)    Causes patient to" vomit more"  . Vitamin D Analogs Rash     MEDICATIONS:  Scheduled: . amLODipine  10 mg Oral Daily  . doxazosin  8 mg Oral QHS  . heparin  5,000 Units Subcutaneous Q8H  . hydrALAZINE  50 mg Oral TID  . labetalol  200 mg Oral BID  . lisinopril  40 mg Oral Daily  . multivitamin  1 tablet Oral QHS  . sevelamer carbonate  800 mg Oral TID WC  . sodium chloride  3 mL Intravenous Q12H  . sodium chloride  3 mL Intravenous Q12H     ROS:                                                                                                                                       History obtained from the patient  General ROS: negative for - chills, fatigue, fever, night sweats, weight gain or weight loss Psychological ROS: negative for - behavioral disorder, hallucinations, memory difficulties, mood swings or suicidal ideation Ophthalmic ROS: negative for - blurry vision, double vision, eye pain or loss of vision ENT ROS: negative for - epistaxis, nasal discharge, oral lesions, sore throat, tinnitus or vertigo Allergy and Immunology ROS: negative for - hives or itchy/watery eyes Hematological and Lymphatic ROS: negative for - bleeding problems, bruising or swollen lymph nodes Endocrine ROS: negative for - galactorrhea, hair pattern changes, polydipsia/polyuria or temperature intolerance Respiratory ROS: negative for - cough, hemoptysis, shortness of breath or wheezing Cardiovascular ROS: negative for - chest pain, dyspnea on exertion, edema or irregular heartbeat Gastrointestinal ROS: negative for - abdominal pain, diarrhea, hematemesis, nausea/vomiting or stool incontinence Genito-Urinary ROS: negative for - dysuria, hematuria, incontinence or urinary frequency/urgency Musculoskeletal ROS: negative for - joint swelling or muscular weakness Neurological ROS: as noted in HPI Dermatological ROS: negative for rash and skin lesion  changes   Blood pressure 163/76, pulse 60, temperature 98.5 F (36.9 C), temperature source Oral, resp. rate 20, weight 85.276 kg (188 lb), SpO2 98.00%.   Neurologic Examination:                                                                                                      Mental Status: Alert, oriented, thought content appropriate.  Speech fluent without evidence of aphasia.  Able to follow 3 step commands without difficulty. Cranial Nerves: II: Discs flat bilaterally; Visual fields grossly normal, pupils equal, round, reactive to light and accommodation III,IV, VI: ptosis not present, extra-ocular motions intact bilaterally V,VII: smile symmetric, facial light touch sensation normal bilaterally VIII: hearing normal bilaterally IX,X: gag reflex present  XI: bilateral shoulder shrug XII: midline tongue extension without atrophy or fasciculations  Motor: Right : Upper extremity   5/5    Left:     Upper extremity   5/5  Lower extremity   5/5     Lower extremity   5/5 with BKA Tone and bulk:normal tone throughout; no atrophy noted Sensory: Pinprick and light touch intact throughout, bilaterally with decreased PP, Temperature from foot to knee on the right. Deep Tendon Reflexes:  Right: Upper Extremity   Left: Upper extremity   biceps (C-5 to C-6) 2/4   biceps (C-5 to C-6) 2/4 tricep (C7) 2/4    triceps (C7) 2/4 Brachioradialis (C6) 2/4  Brachioradialis (C6) 2/4  Lower Extremity Lower Extremity  quadriceps (L-2 to L-4) 2/4   quadriceps (L-2 to L-4) 2/4 Achilles (S1) 2/4   Achilles (S1) BKA Plantars: Right: downgoing   Left: BKA Cerebellar: normal finger-to-nose,   Gait: not tested due to multiple leads. CV: pulses palpable throughout    Lab Results  Component Value Date/Time   CHOL  Value: 170        ATP III CLASSIFICATION:  <200     mg/dL   Desirable  956-213  mg/dL   Borderline High  >=086    mg/dL   High        57/84/6962  7:28 PM    Results for orders placed during  the hospital encounter of 08/13/13 (from the past 48 hour(s))  CBC WITH DIFFERENTIAL     Status: Abnormal   Collection Time    08/13/13  8:55 AM      Result Value Range   WBC 8.2  4.0 - 10.5 K/uL   RBC 3.43 (*) 3.87 - 5.11 MIL/uL   Hemoglobin 11.0 (*) 12.0 - 15.0 g/dL   HCT 95.2 (*) 84.1 - 32.4 %   MCV 98.0  78.0 - 100.0 fL   MCH 32.1  26.0 - 34.0 pg   MCHC 32.7  30.0 - 36.0 g/dL   RDW 40.1  02.7 - 25.3 %   Platelets 89 (*) 150 - 400 K/uL   Comment: SPECIMEN CHECKED FOR CLOTS     PLATELET COUNT CONFIRMED BY SMEAR   Neutrophils Relative % 75  43 - 77 %   Neutro Abs 6.2  1.7 - 7.7 K/uL   Lymphocytes Relative 15  12 - 46 %   Lymphs Abs 1.3  0.7 - 4.0 K/uL   Monocytes Relative 5  3 - 12 %   Monocytes Absolute 0.4  0.1 - 1.0 K/uL   Eosinophils Relative 4  0 - 5 %   Eosinophils Absolute 0.3  0.0 - 0.7 K/uL   Basophils Relative 0  0 - 1 %   Basophils Absolute 0.0  0.0 - 0.1 K/uL   Smear Review PLATELETS APPEAR DECREASED    COMPREHENSIVE METABOLIC PANEL     Status: Abnormal   Collection Time    08/13/13  8:55 AM      Result Value Range   Sodium 136  135 - 145 mEq/L   Potassium 4.3  3.5 - 5.1 mEq/L   Chloride 93 (*) 96 - 112 mEq/L   CO2 26  19 - 32 mEq/L   Glucose, Bld 176 (*) 70 - 99 mg/dL   BUN 55 (*) 6 - 23 mg/dL   Creatinine, Ser 6.64 (*) 0.50 - 1.10 mg/dL   Calcium 40.3  8.4 - 47.4 mg/dL   Total Protein 7.4  6.0 - 8.3 g/dL  Albumin 3.8  3.5 - 5.2 g/dL   AST 23  0 - 37 U/L   ALT 16  0 - 35 U/L   Alkaline Phosphatase 85  39 - 117 U/L   Total Bilirubin 0.4  0.3 - 1.2 mg/dL   GFR calc non Af Amer 4 (*) >90 mL/min   GFR calc Af Amer 5 (*) >90 mL/min   Comment: (NOTE)     The eGFR has been calculated using the CKD EPI equation.     This calculation has not been validated in all clinical situations.     eGFR's persistently <90 mL/min signify possible Chronic Kidney     Disease.  TROPONIN I     Status: None   Collection Time    08/13/13  8:55 AM      Result Value Range    Troponin I <0.30  <0.30 ng/mL   Comment:            Due to the release kinetics of cTnI,     a negative result within the first hours     of the onset of symptoms does not rule out     myocardial infarction with certainty.     If myocardial infarction is still suspected,     repeat the test at appropriate intervals.  GLUCOSE, CAPILLARY     Status: Abnormal   Collection Time    08/13/13  2:25 PM      Result Value Range   Glucose-Capillary 105 (*) 70 - 99 mg/dL   Comment 1 Documented in Chart     Comment 2 Notify RN    CBC     Status: Abnormal (Preliminary result)   Collection Time    08/13/13  4:00 PM      Result Value Range   WBC 6.7  4.0 - 10.5 K/uL   RBC 3.63 (*) 3.87 - 5.11 MIL/uL   Hemoglobin 11.6 (*) 12.0 - 15.0 g/dL   HCT 96.0 (*) 45.4 - 09.8 %   MCV 97.5  78.0 - 100.0 fL   MCH 32.0  26.0 - 34.0 pg   MCHC 32.8  30.0 - 36.0 g/dL   RDW 11.9  14.7 - 82.9 %   Platelets PENDING  150 - 400 K/uL    Dg Chest 1 View  08/13/2013   CLINICAL DATA:  67 year old female with acute onset diffusion shortness of breath and chest pain while in dialysis. Initial encounter.  EXAM: CHEST - 1 VIEW  COMPARISON:  11/03/2011 and earlier.  FINDINGS: Portable AP upright view at 0923 hrs. Right IJ approach dual lumen dialysis catheter in place. Stable lung volumes. Stable cardiomegaly and mediastinal contours. No pneumothorax, pulmonary edema, pleural effusion or consolidation. Overall decreased pulmonary vascularity compared to the previous exam. Multiple wires an EKG leads overlie the chest.  IMPRESSION: No acute cardiopulmonary abnormality.   Electronically Signed   By: Augusto Gamble M.D.   On: 08/13/2013 10:06   Ct Head Wo Contrast  08/13/2013   CLINICAL DATA:  Loss of consciousness, seizure.  EXAM: CT HEAD WITHOUT CONTRAST  TECHNIQUE: Contiguous axial images were obtained from the base of the skull through the vertex without intravenous contrast.  COMPARISON:  August 29, 2010.  FINDINGS: Bony calvarium  appears intact. Diffuse cortical atrophy is noted. No mass effect or midline shift is noted. Ventricular size is within normal limits. There is no evidence of mass lesion, hemorrhage or acute infarction.  IMPRESSION: Mild diffuse cortical atrophy. No acute intracranial abnormality  seen.   Electronically Signed   By: Roque Lias M.D.   On: 08/13/2013 09:58    Assessment and plan per attending neurologist  Felicie Morn PA-C [facility] [phone]  08/13/2013, 5:06 PM   Assessment/Plan: 67 YO female with brief episode of light headedness, possible seizure activity while in dialysis.  Patient describes similar episode on previous dialysis session when her BP was dropped rapidly.  unfortunately no BP documentation is available.  Likely presyncopal/syncopal episode with myoclonic activity, however, given vague description of event cannot exclude possible seizure.   Recommend: 1) EEG (ordered) 2) MRI brain (ordered) 3) would hold off on AED at this time until EEG and MRI have been obtained 4) seizure precautions   Patient seen and examined together with physician assistant and I concur with the assessment and plan.  Wyatt Portela, MD

## 2013-08-13 NOTE — ED Notes (Signed)
Informed of bed status. Daughter given room number

## 2013-08-13 NOTE — Progress Notes (Signed)
08/13/2013 Patient transfer from [facility] to [facility] at [place], she is alert, oriented and have a left bka. Prosthesis is with daughter. Patient have a right hd cath area is clean and dry. She have a left upper arm fistula and it is positive. She was place on telemetry when arrive on unit. [facility] RN.

## 2013-08-13 NOTE — ED Notes (Signed)
[facility] at bedside 

## 2013-08-14 ENCOUNTER — Inpatient Hospital Stay (HOSPITAL_COMMUNITY): Payer: Medicare Other

## 2013-08-14 DIAGNOSIS — R404 Transient alteration of awareness: Secondary | ICD-10-CM | POA: Diagnosis not present

## 2013-08-14 DIAGNOSIS — I469 Cardiac arrest, cause unspecified: Secondary | ICD-10-CM

## 2013-08-14 DIAGNOSIS — D696 Thrombocytopenia, unspecified: Secondary | ICD-10-CM | POA: Diagnosis present

## 2013-08-14 DIAGNOSIS — I959 Hypotension, unspecified: Secondary | ICD-10-CM

## 2013-08-14 DIAGNOSIS — R4189 Other symptoms and signs involving cognitive functions and awareness: Secondary | ICD-10-CM | POA: Diagnosis not present

## 2013-08-14 HISTORY — DX: Cardiac arrest, cause unspecified: I46.9

## 2013-08-14 LAB — BLOOD GAS, ARTERIAL
Acid-Base Excess: 0.2 mmol/L (ref 0.0–2.0)
Bicarbonate: 23.6 mEq/L (ref 20.0–24.0)
Drawn by: 330991
O2 Content: 6 L/min
pCO2 arterial: 33.7 mmHg — ABNORMAL LOW (ref 35.0–45.0)
pH, Arterial: 7.46 — ABNORMAL HIGH (ref 7.350–7.450)
pO2, Arterial: 258 mmHg — ABNORMAL HIGH (ref 80.0–100.0)

## 2013-08-14 LAB — BASIC METABOLIC PANEL
BUN: 72 mg/dL — ABNORMAL HIGH (ref 6–23)
BUN: 66 mg/dL — ABNORMAL HIGH (ref 6–23)
CO2: 25 mEq/L (ref 19–32)
Calcium: 9.5 mg/dL (ref 8.4–10.5)
Chloride: 96 mEq/L (ref 96–112)
Chloride: 94 mEq/L — ABNORMAL LOW (ref 96–112)
Creatinine, Ser: 9.08 mg/dL — ABNORMAL HIGH (ref 0.50–1.10)
GFR calc non Af Amer: 4 mL/min — ABNORMAL LOW (ref >90)
Glucose, Bld: 133 mg/dL — ABNORMAL HIGH (ref 70–99)
Glucose, Bld: 132 mg/dL — ABNORMAL HIGH (ref 70–99)
Potassium: 5.3 mEq/L — ABNORMAL HIGH (ref 3.5–5.1)
Potassium: 5 mEq/L (ref 3.5–5.1)

## 2013-08-14 LAB — GLUCOSE, CAPILLARY: Glucose-Capillary: 138 mg/dL — ABNORMAL HIGH (ref 70–99)

## 2013-08-14 LAB — CBC
HCT: 32.2 % — ABNORMAL LOW (ref 36.0–46.0)
Hemoglobin: 10.6 g/dL — ABNORMAL LOW (ref 12.0–15.0)
MCH: 32.3 pg (ref 26.0–34.0)
MCHC: 32.9 g/dL (ref 30.0–36.0)

## 2013-08-14 LAB — RENAL FUNCTION PANEL
BUN: 72 mg/dL — ABNORMAL HIGH (ref 6–23)
CO2: 26 mEq/L (ref 19–32)
Calcium: 9.3 mg/dL (ref 8.4–10.5)
Glucose, Bld: 123 mg/dL — ABNORMAL HIGH (ref 70–99)
Phosphorus: 6.3 mg/dL — ABNORMAL HIGH (ref 2.3–4.6)
Potassium: 4.9 mEq/L (ref 3.5–5.1)

## 2013-08-14 LAB — TROPONIN I
Troponin I: <0.3 ng/mL (ref <0.30)
Troponin I: <0.3 ng/mL (ref <0.30)
Troponin I: <0.3 ng/mL (ref <0.30)

## 2013-08-14 MED ORDER — ACETAMINOPHEN 325 MG PO TABS
ORAL_TABLET | ORAL | Status: AC
  Filled 2013-08-14: qty 2

## 2013-08-14 MED ORDER — HEPARIN SODIUM (PORCINE) 1000 UNIT/ML IJ SOLN
1700.0000 [IU] | Freq: Once | INTRAMUSCULAR | Status: AC
  Administered 2013-08-14: 1700 [IU] via INTRAVENOUS

## 2013-08-14 MED ORDER — KETOROLAC TROMETHAMINE 30 MG/ML IJ SOLN
30.0000 mg | Freq: Once | INTRAMUSCULAR | Status: AC
  Administered 2013-08-14: 30 mg via INTRAVENOUS
  Filled 2013-08-14: qty 1

## 2013-08-14 MED ORDER — INSULIN ASPART 100 UNIT/ML ~~LOC~~ SOLN: 0.0000 [IU] | Freq: Every day | SUBCUTANEOUS | Status: DC

## 2013-08-14 MED ORDER — INSULIN ASPART 100 UNIT/ML ~~LOC~~ SOLN
0.0000 [IU] | Freq: Three times a day (TID) | SUBCUTANEOUS | Status: DC
  Administered 2013-08-14 – 2013-08-15 (×2): 3 [IU] via SUBCUTANEOUS
  Administered 2013-08-16 – 2013-08-17 (×3): 2 [IU] via SUBCUTANEOUS

## 2013-08-14 MED FILL — Medication: Qty: 1 | Status: AC

## 2013-08-14 NOTE — Progress Notes (Signed)
[facility] PROGRESS NOTE  Briana Gould NFA:[id number] DOB: 1946-06-30 DOA: 08/13/2013 PCP: [name], MD Brief narrative 67 y.o. female with a Past Medical History of ESRD on hemodialysis (Mondays, Wednesdays and Fridays), peripheral vascular disease status post left BKA, hypertension who presented to [facility] ED  with episodes concerning for seizures vs syncope while on HD for last 2 sessions.  On 12/5 while  just 10-15 minutes into dialysis, patient felt lightheaded, and was noted to have generalized tightness and some "seizure like activity". Patient claims that she had urinary incontinence associated with this. There was no tongue biting.   her blood pressure was low-during this episode. She also claims, to have vomited once during this episode. She is not sure whether she lost consciousness or not, but claims that she was not completely "with it" during this entire episode. There is no documentation of any post ictal state. There is no documentation as to exactly what the blood pressure readings were. She was then transferred to [facility] for neurological w/up. On 12/6 am while she was shortly into HD she became unresponsiveness with absence of pulse. Code blue was called and CPR initiated for almost 4 minutes. patient's BP was recorded to be stable throughout. No seizure was witnessed. No bowel or urinary incontinence noted. No arrythmia detected on the monitor. Patient given a dose of epinephrine during resuscitation after which she had a pulse and regained consciousness.  Patient was oriented during my evaluation and satting well on facemask.  She reports of chest pain.  EKG showed NSR @74  , no ST-T changes. Labs sent.  PCCM consulted and patient planned to be moved to ICU.  Assessment/Plan: Acute unresponsiveness Differential includes acute PE, arrythmia, acute stroke, ACS or seizure. Check ABG and labs including cbc, CMET, troponins.  will order CT angiogram of chest to r/o  PE, MRI brain alrady ordered for concern for seizure. Will add MRA of head , carotid doppler and 2 D echo to complete stroke w up. 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 No narcotics or benzos were given to the patient. -check EEG.  -transfer to ICU.  Syncope / seizures Neurology following  plan on MRI brain and EEG. Ativan prn for seizure activity.  ESRD Has not had full HD since 12/3. Patient's wt is 89 kgs with her dry wt to be reported as 81 kg. Renal following and will determine timing for next HD.  HTN Resume antihypertensives as tolerated  PAD  s/p lt BKA     Code Status: full Family Communication: daughter informed by code team Disposition Plan: tx to ICU   Consultants:  Renal  Neurology   PCCM  Procedures:  CPR on 12/6  HD  Antibiotics:  none  HPI/Subjective: Code blue called as patient was unresponsive. Received CPR for 4 mins followed by 1 dose IV epinephrine after which she regained pulse and consciousness. Denies recollection fo event  Objective: Filed Vitals:   08/14/13 0731  BP: 150/61  Pulse: 58  Temp:   Resp: 18    Intake/Output Summary (Last 24 hours) at 08/14/13 0813 Last data filed at 08/14/13 0659  Gross per 24 hour  Intake    120 ml  Output      0 ml  Net    120 ml   Filed Weights   08/13/13 0907 08/14/13 0711  Weight: 85.276 kg (188 lb) 89 kg (196 lb 3.4 oz)    Exam:   General:elderly female  lying in bed arousal to command  HEENT:on face mask, no pallor, moist mucosa  Chest c;ear b/l, no added sounds  CVS: NS1&S2, no murmurs, rubs or gallop  Abd: soft, NT, ND, BS+  Ext: trace edema over rt leg, left BKA  CNS: awake to commands. Oriented. No focal deficit.    Data Reviewed: Basic Metabolic Panel:  Recent Labs Lab 08/13/13 0855 08/13/13 1600 08/14/13 0416  NA 136  --  137  K 4.3  --  5.3*  CL 93*  --  96  CO2 26  --  26  GLUCOSE 176*  --  133*  BUN  55*  --  72*  CREATININE 8.32* 8.69* 9.37*  CALCIUM 10.2  --  9.5   Liver Function Tests:  Recent Labs Lab 08/13/13 0855  AST 23  ALT 16  ALKPHOS 85  BILITOT 0.4  PROT 7.4  ALBUMIN 3.8   No results found for this basename: LIPASE, AMYLASE,  in the last 168 hours No results found for this basename: AMMONIA,  in the last 168 hours CBC:  Recent Labs Lab 08/13/13 0855 08/13/13 1600 08/14/13 0416  WBC 8.2 6.7 6.9  NEUTROABS 6.2  --   --   HGB 11.0* 11.6* 10.6*  HCT 33.6* 35.4* 32.2*  MCV 98.0 97.5 98.2  PLT 89* 92* 95*   Cardiac Enzymes:  Recent Labs Lab 08/13/13 0855 08/13/13 1518 08/13/13 2125 08/14/13 0446  TROPONINI <0.30 <0.30 <0.30 <0.30   BNP (last 3 results) No results found for this basename: PROBNP,  in the last 8760 hours CBG:  Recent Labs Lab 08/13/13 1425  GLUCAP 105*    Recent Results (from the past 240 hour(s))  MRSA PCR SCREENING     Status: None   Collection Time    08/13/13  8:41 PM      Result Value Range Status   MRSA by PCR NEGATIVE  NEGATIVE Final   Comment:            The GeneXpert MRSA Assay (FDA     approved for NASAL specimens     only), is one component of a     comprehensive MRSA colonization     surveillance program. It is not     intended to diagnose MRSA     infection nor to guide or     monitor treatment for     MRSA infections.     Studies: Dg Chest 1 View  08/13/2013   CLINICAL DATA:  67 year old female with acute onset diffusion shortness of breath and chest pain while in dialysis. Initial encounter.  EXAM: CHEST - 1 VIEW  COMPARISON:  11/03/2011 and earlier.  FINDINGS: Portable AP upright view at 0923 hrs. Right IJ approach dual lumen dialysis catheter in place. Stable lung volumes. Stable cardiomegaly and mediastinal contours. No pneumothorax, pulmonary edema, pleural effusion or consolidation. Overall decreased pulmonary vascularity compared to the previous exam. Multiple wires an EKG leads overlie the chest.   IMPRESSION: No acute cardiopulmonary abnormality.   Electronically Signed   By: Augusto Gamble M.D.   On: 08/13/2013 10:06   Ct Head Wo Contrast  08/13/2013   CLINICAL DATA:  Loss of consciousness, seizure.  EXAM: CT HEAD WITHOUT CONTRAST  TECHNIQUE: Contiguous axial images were obtained from the base of the skull through the vertex without intravenous contrast.  COMPARISON:  August 29, 2010.  FINDINGS: Bony calvarium appears intact. Diffuse cortical atrophy is noted. No mass effect or midline shift is noted. Ventricular size  is within normal limits. There is no evidence of mass lesion, hemorrhage or acute infarction.  IMPRESSION: Mild diffuse cortical atrophy. No acute intracranial abnormality seen.   Electronically Signed   By: Roque Lias M.D.   On: 08/13/2013 09:58    Scheduled Meds: . amLODipine  10 mg Oral Daily  . doxazosin  8 mg Oral QHS  . heparin  5,000 Units Subcutaneous Q8H  . hydrALAZINE  50 mg Oral TID  . labetalol  200 mg Oral BID  . lisinopril  40 mg Oral Daily  . multivitamin  1 tablet Oral QHS  . sevelamer carbonate  800 mg Oral TID WC  . sodium chloride  3 mL Intravenous Q12H  . sodium chloride  3 mL Intravenous Q12H   Continuous Infusions:     Time spent:35 minutes    Briana Gould  [facility] Pager [phone] If 7PM-7AM, please contact night-coverage at [email or web address], password [facility] 08/14/2013, 8:13 AM  LOS: 1 day

## 2013-08-14 NOTE — Progress Notes (Signed)
Pt A&Ox4  In HD for tx as ordered. Bed in low position, wheels locked, 2siderails up, call bell within reach, HOB elevated at least 30 degrees. Started dialyzing pt approximated at 0726, V/S BP150/61,HR57,RR18, on crit line. At around 0739 pt was noted unresponsive/pulseless Charge nurse aware,Code blue was called in as well as rapid response. CPR initiated, simple mask attached at max rate of 15l/min and rinsed back,rapid response nurse arrives at 0741, and code blue team physicians. Epinephrine given x1. Approximated at 0746 pt is verbally responsive,alert and oriented.V/S 202/86,HR84,02 at 100%. Code blue MDs at bedside,attending MD Dr. Gonzella Lex in unit as well as on call MD Dr. Arlean Hopping. Pt  Is transferred to ICU at around 0825. DON Clydie Braun and Wellsite geologist Dr. Hyman Hopes made aware of event. Family has been informed.

## 2013-08-14 NOTE — Significant Event (Addendum)
Rapid Response Event Note  Overview: Time Called: 0741 Arrival Time: 0742 Event Type: Other (Comment)  Initial Focused Assessment:  Called by RN for Code Blue.  Upon arrival to patients bedside, CPR underway.  See code blue sheet.  As per HD RN, patient just started HD treatment and 22 minutes into treatment patient went unresponsive with no pulse, CPR for 5 minutes.  One epi given, ROSC.     Interventions:  See code blue sheet, MD at bedside.  ABG done, results given to MD.  Patient placed on face mask.  Patient is oriented and c/o chest discomfort, HD RN gave 650mg  Tylenol.   Patient transported to 22M 02, Report given to Receiving RN.     Event Summary:  Family notified by MD and belongings sent with patient   at      at          [facility]

## 2013-08-14 NOTE — Procedures (Signed)
I was present at this dialysis session, have reviewed the session itself and made  appropriate changes   Vinson Moselle MD  pager [phone]    cell [phone]  08/14/2013, 9:42 AM

## 2013-08-14 NOTE — Progress Notes (Signed)
Code called in Hemodialysis, pt being bagged and CPR in progress upon RT arrival. Pulse was restored, and pt placed on Simple mask by [facility] staff. Pt is somewhat oriented to place. ABG drawn. Pt to transfer to ICU. RT will continue to monitor.

## 2013-08-14 NOTE — Procedures (Signed)
EEG report.  Brief clinical history: 67 YO female with brief episode of light headedness, possible seizure activity while in dialysis.   Technique: this is a 17 channel routine scalp EEG performed at the bedside in the ICU setting, with bipolar and monopolar montages arranged in accordance to the international 10/20 system of electrode placement. One channel was dedicated to EKG recording.  No activating procedures performed.  Description:In the wakeful state, the best background consisted of a medium amplitude, posterior dominant, well sustained, symmetric and reactive 9 Hz rhythm. Drowsiness demonstrated dropout of the alpha rhythm. Stage 2 sleep showed symmetric and synchronous sleep spindles without intermixed epileptiform discharges. No focal or generalized epileptiform discharges noted.  No slowing seen.  EKG showed sinus rhythm.  Impression: this is a normal awake and asleep EEG.  Please, be aware that a normal EEG does not exclude the possibility of epilepsy.  Clinical correlation is advised.  Wyatt Portela, MD

## 2013-08-14 NOTE — Progress Notes (Signed)
PT Cancellation Note  Patient Details Name: Richie Swanston-Trenkamp MRN: [id number] DOB: 1945/09/30   Cancelled Treatment:    Reason Eval/Treat Not Completed: Medical issues which prohibited therapy;Patient not medically ready. Order cancelled per MD. Pt transferred from hemodialysis to ICU. Please re-order when medically ready.    Donnamarie Poag [place], [place] [phone] 08/14/2013, 10:24 AM

## 2013-08-14 NOTE — Code Documentation (Signed)
CODE BLUE NOTE  Patient Name: Briana Gould   MRN: [id number]   Date of Birth/ Sex: 09-Aug-1946 , female      Admission Date: 08/13/2013  Attending Provider: Eddie North, MD  Primary Diagnosis: Seizure    Indication: Briana Gould is a 67 y.o Female with a significant h/o ESRD (on HD) and HTN, admitted for seizure activity and syncopal episodes while on dialysis. She was in her usual state of health this AM, while actively on hemodialysis machine for about 10 minutes, when she was noted to be unresponsive and pulseless. Code blue was subsequently called. At the time of arrival on scene, ACLS protocol was underway.    Technical Description:  - CPR performance duration:  5 minutes    - Was defibrillation or cardioversion used? No   - Was external pacer placed? No  - Was patient intubated pre/post CPR? No    Medications Administered: Y = Yes; Blank = No Amiodarone  N  Atropine  N  Calcium  N  Epinephrine  Y, 1 mg x 1  Lidocaine  N  Magnesium  N  Norepinephrine  N  Phenylephrine  N  Sodium bicarbonate  N  Vasopressin  N    Post CPR evaluation:  - Final Status - Was patient successfully resuscitated ? Yes - What is current rhythm? NSR - What is current hemodynamic status? Stable, elevated BP 202/86   Miscellaneous Information:  - Labs sent, including: ABG, BMET, CBG, Troponins x 3 (q 6 hr), Portable CXR, EKG  - Primary team notified?  Yes - Dr. Gonzella Lex was present and responded. Agreed to follow-up lab-work / imaging ordered during this event.  - Family Notified? Yes  - Additional notes/ transfer status: Contacted CCM, patient to be transferred to ICU.       Code Team in attendance:  Eddie North, MD - Primary Team Attending  Dow Adolph, MD - Internal Medicine, PGY-2  Saralyn Pilar, DO - Family Medicine, PGY-1 08/14/2013, 8:17 AM

## 2013-08-14 NOTE — Consult Note (Signed)
PULMONARY  / CRITICAL CARE MEDICINE  Name: Briana Gould MRN: [id number] DOB: Apr 16, 1946    ADMISSION DATE:  08/13/2013 CONSULTATION DATE:  12/06    REFERRING MD :  TRH  BRIEF PATIENT DESCRIPTION:  75 F with ESRD adm by [facility] 12/05 for hypotension during HD. Transferred to ICU 12/06 after recurrent hypotension during HD with possible brief pulselessness and brief CPR. Not intubated and cognitively fully intact after episode  SIGNIFICANT EVENTS / STUDIES:  12/05 CT head: NAICP 12/06 EEG:  12/06 MRI brain:   LINES / TUBES:   CULTURES: Blood 12/06 >>   ANTIBIOTICS:   HISTORY OF PRESENT ILLNESS:   Very pleasant 89 F with hypertension on multiple antihypertensive meds and ESRD. She has had repeated difficulty with hypotension during last 3 rounds of HD. On 12/03, she able to complete HD. On 12/05 HD was cut short because of symptomatic HD and she was admitted by [facility] for further evaluation. She had transiently lost consciousness and an evaluation for possible neurological causes was initiated. On the day of this consultation, she suffered a recurrent episode and was thought to have lost her pulse. Chest compressions were started and rapid response was called. Upon arrival of RRT, pt had palpable pulse and was interactive. HD was stopped and pt was transferred to ICU for further eval and mgmt. Her only complaint is musculoskeletal chest pain after the chest compressions. She denies fevers, chills, dyspnea, anginal CP, abdominal pain, N/V/D, LE edema and calf tenderness  PAST MEDICAL HISTORY :  Past Medical History  Diagnosis Date  . Hypertension   . PAD (peripheral artery disease)   . Type II diabetes mellitus   . Anemia   . History of blood transfusion 2002    "related to amputation" (08/13/2013)  . End stage renal disease on dialysis     "[facility]; M, W, F" (08/13/2013)   Past Surgical History  Procedure Laterality Date  . Av fistula placement Left 2011   upper arm  . Below knee leg amputation Left 2002  . Tonsillectomy    . Cataract extraction w/ intraocular lens  implant, bilateral Bilateral 2012-2014   Prior to Admission medications   Medication Sig Start Date End Date Taking? Authorizing Provider  amLODipine (NORVASC) 10 MG tablet Take 10 mg by mouth daily.   Yes Historical Provider, MD  b complex-vitamin c-folic acid (NEPHRO-VITE) 0.8 MG TABS tablet Take 1 tablet by mouth daily.   Yes Historical Provider, MD  doxazosin (CARDURA) 8 MG tablet Take 8 mg by mouth at bedtime.   Yes Historical Provider, MD  hydrALAZINE (APRESOLINE) 25 MG tablet Take 2 tablets (50 mg total) by mouth 3 (three) times daily. 11/08/11  Yes Nishant Dhungel, MD  labetalol (NORMODYNE) 200 MG tablet TAKE 1 TABLET BY MOUTH TWICE A DAY 11/29/11  Yes Luis Abed, MD  lisinopril (PRINIVIL,ZESTRIL) 40 MG tablet Take 40 mg by mouth daily.   Yes Historical Provider, MD  sevelamer carbonate (RENVELA) 800 MG tablet Take 800 mg by mouth 3 (three) times daily with meals.   Yes Historical Provider, MD  doxazosin (CARDURA) 8 MG tablet Take 1 tablet (8 mg total) by mouth at bedtime. 11/08/11 11/07/12  Nishant Dhungel, MD  labetalol (NORMODYNE) 200 MG tablet Take 200 mg by mouth 2 (two) times daily. 10/01/11 09/30/12  Luis Abed, MD   Allergies  Allergen Reactions  . Phenergan [Promethazine Hcl] Other (See Comments)    Causes patient to" vomit more"  . Vitamin  D Analogs Rash    FAMILY HISTORY:  Family History  Problem Relation Age of Onset  . Hypertension Mother   . Hypertension Father    SOCIAL HISTORY:  reports that she has never smoked. She has never used smokeless tobacco. She reports that she does not drink alcohol or use illicit drugs.  REVIEW OF SYSTEMS:  As per HPI. Otherwise, negative  SUBJECTIVE:   VITAL SIGNS: Temp:  [98.4 F (36.9 C)-99.1 F (37.3 C)] 98.4 F (36.9 C) (12/06 0711) Pulse Rate:  [57-84] 65 (12/06 1000) Resp:  [0-18] 15 (12/06 1000) BP:  (113-202)/(51-96) 161/96 mmHg (12/06 1000) SpO2:  [98 %-100 %] 100 % (12/06 1000) Weight:  [86.8 kg (191 lb 5.8 oz)-89 kg (196 lb 3.4 oz)] 86.8 kg (191 lb 5.8 oz) (12/06 0900) HEMODYNAMICS:   VENTILATOR SETTINGS:   INTAKE / OUTPUT: Intake/Output     12/05 0701 - 12/06 0700 12/06 0701 - 12/07 0700   P.O. 120    Total Intake(mL/kg) 120 (1.4)    Total Output 0     Net +120            PHYSICAL EXAMINATION: General:  WDWN, NAD Neuro: CNs intact, motor/sens intact, no focal deficits HEENT: EOMI, PERRL, NCAT Cardiovascular: RRR, + systolic M @ LSB radiating to neck Lungs: R sided tunneled HD cath, clear throughout Abdomen: Soft, NT, +BS Ext: s/p L BKA, no edema   LABS: I have reviewed all of today's lab results. Relevant abnormalities are discussed in the A/P section  CXR: CM, NACPD  ASSESSMENT / PLAN:  PULMONARY A: No issues P:   Monitor  Supplemental O2 as needed  CARDIOVASCULAR A:  H/O hypertension Recurrent hypotension during HD Doubt PE P:  Cont antihypertensives for now Next HD should occur in ICU Would consider holding anti-hypertensives prior to planned HD CT angiogram cancelled  RENAL A:   ESRD P:   Mgmt per Renal  GASTROINTESTINAL A:   No issues P:   SUP: N/I  HEMATOLOGIC A:   Chronic mild anemia without overt blood loss Thrombocytopenia - unclear etiology P:  Monitor CBC intermittently Cont SQ heparin for now but consider DC if plts drop further  INFECTIOUS A:   Concern for possible HD cath infection P:   Blood cultures ordered - one to be drawn from HD cath Monitor off abx for now  ENDOCRINE A:  DM II P:   SSI ordered  NEUROLOGIC A:  Transient LOC during HD Chest pain after chest compressions P:   Eval and mgmt per Neuro PRN analgesics  TODAY'S SUMMARY:   I have personally obtained a history, examined the patient, evaluated laboratory and imaging results, formulated the assessment and plan and placed orders. CRITICAL  CARE: The patient is critically ill with multiple organ systems failure and requires high complexity decision making for assessment and support, frequent evaluation and titration of therapies, application of advanced monitoring technologies and extensive interpretation of multiple databases. Critical Care Time devoted to patient care services described in this note is  minutes.   Billy Fischer, MD ; [facility] [phone].  After 5:30 PM or weekends, call [phone] Pulmonary and Critical Care Medicine   08/14/2013, 3:12 PM

## 2013-08-14 NOTE — Progress Notes (Signed)
[facility] Progress Note    Subjective: Pt had loss of pulse and BP 10 minutes into dialysis today, CPR 5 min with return of vitals after IV epi and CPR.  Pt alert, sore chest. HD stopped.  Transferred to ICU.  EKG was negative per primary MD.    Exam  Blood pressure 145/51, pulse 66, temperature 98.4 F (36.9 C), temperature source Oral, resp. rate 0, weight 89 kg (196 lb 3.4 oz), SpO2 98.00%. Gen: alert sl obese BF, NAD , appropriate  Neck: no jvd  Heart: RRR, soft 1/6 sem lsb, no rub or gallop  Lungs: CTA bilaterally  Abd: Obese, soft ,nontender, nondistended  Ext: L BKA stump no edema, trace pretibial edema R ankle  Skin: No overt rash or ulcer  Neuro: OX3, moves all extrem.  Access: Pos. Bruit LUA AVF/ R IJ perm cath   Dialysis Orders: Center: [facility] on MWF .  3:45hrs   2/2.25 Bath     81kg    Heparin 8000    R IJ Cath and LUA AVF Zemplar 3ug    Epogen none    Venofer none  Assessment/Plan  1. S/P cardiac arrest-  10 min into dialysis this am, CPR and IV epi, awake and alert now. Last 2 HD sessions has had syncope or near-syncope; on 12/3 she had chest pain and low bp 20 min into HD, and a second episode 80 min into HD even though UF was off and BP was normal.  Yesterday she had a low bp episode (69/53) 15 min into HD and HD was stopped and pt sent to ED. CCM evaluating, r/o sepsis from catheter. Other possibilities are vol depletion due to UF, coronary disease (+chest pain during some of these episodes this week), RV dysfunction/pulm HTN (severe pulm HTN on 2011 echo), carotid disease, severe small vessel disease from chronically uncontrolled HTN, or a combination. She has a long hx of poor compliance with BP medications. She does go to all her HD treatments. Troponins here are negative.  Blood cx's ordered, MRA head/neck vessels.  No further attempts at HD today. Reassess tomorrow.   2. ESRD: usual MWF , as above 3. Hypertension/volume - bp meds (ccb, acei,  bb), reweigh with hoyer lift 4. Anemia - hgb .12 no epo or iron  5. Metabolic bone disease - Binders and vit d/ fu am labs 6. Nutrition - Renal diet / renal vit. 7. DM type 2 per admit 8. PAD remote L bka   Vinson Moselle MD  pager [phone]    cell [phone]  08/14/2013, 9:06 AM   Recent Labs Lab 08/13/13 0855 08/13/13 1600 08/14/13 0416 08/14/13 0735  NA 136  --  137 137  K 4.3  --  5.3* 4.9  CL 93*  --  96 95*  CO2 26  --  26 26  GLUCOSE 176*  --  133* 123*  BUN 55*  --  72* 72*  CREATININE 8.32* 8.69* 9.37* 9.51*  CALCIUM 10.2  --  9.5 9.3  PHOS  --   --   --  6.3*    Recent Labs Lab 08/13/13 0855 08/14/13 0735  AST 23  --   ALT 16  --   ALKPHOS 85  --   BILITOT 0.4  --   PROT 7.4  --   ALBUMIN 3.8 3.3*    Recent Labs Lab 08/13/13 0855 08/13/13 1600 08/14/13 0416  WBC 8.2 6.7 6.9  NEUTROABS 6.2  --   --  HGB 11.0* 11.6* 10.6*  HCT 33.6* 35.4* 32.2*  MCV 98.0 97.5 98.2  PLT 89* 92* 95*   . acetaminophen      . amLODipine  10 mg Oral Daily  . doxazosin  8 mg Oral QHS  . heparin  5,000 Units Subcutaneous Q8H  . hydrALAZINE  50 mg Oral TID  . insulin aspart  0-15 Units Subcutaneous TID WC  . insulin aspart  0-5 Units Subcutaneous QHS  . ketorolac  30 mg Intravenous Once  . labetalol  200 mg Oral BID  . lisinopril  40 mg Oral Daily  . multivitamin  1 tablet Oral QHS  . sevelamer carbonate  800 mg Oral TID WC  . sodium chloride  3 mL Intravenous Q12H  . sodium chloride  3 mL Intravenous Q12H     sodium chloride, acetaminophen, albuterol, LORazepam, ondansetron (ZOFRAN) IV, ondansetron, sodium chloride

## 2013-08-14 NOTE — Progress Notes (Signed)
Report called to [place], RN on 51M.  Ready to receive patient, who apparently lost consciousness / pulse while on dialysis.  Patient to be transferred once stabilized.  Patient has dentures with her.  No clothing in room.  Medications and lotion sent to 51M.

## 2013-08-14 NOTE — Progress Notes (Signed)
Bedside EEG completed. Results pending.  

## 2013-08-15 ENCOUNTER — Inpatient Hospital Stay (HOSPITAL_COMMUNITY): Payer: Medicare Other

## 2013-08-15 DIAGNOSIS — I679 Cerebrovascular disease, unspecified: Secondary | ICD-10-CM | POA: Diagnosis present

## 2013-08-15 DIAGNOSIS — I359 Nonrheumatic aortic valve disorder, unspecified: Secondary | ICD-10-CM

## 2013-08-15 DIAGNOSIS — I82890 Acute embolism and thrombosis of other specified veins: Secondary | ICD-10-CM | POA: Diagnosis present

## 2013-08-15 DIAGNOSIS — R55 Syncope and collapse: Secondary | ICD-10-CM

## 2013-08-15 DIAGNOSIS — I2510 Atherosclerotic heart disease of native coronary artery without angina pectoris: Secondary | ICD-10-CM

## 2013-08-15 LAB — CBC
Hemoglobin: 10.4 g/dL — ABNORMAL LOW (ref 12.0–15.0)
MCH: 32.6 pg (ref 26.0–34.0)
MCV: 96.9 fL (ref 78.0–100.0)
Platelets: 83 10*3/uL — ABNORMAL LOW (ref 150–400)
RBC: 3.19 MIL/uL — ABNORMAL LOW (ref 3.87–5.11)
RDW: 15 % (ref 11.5–15.5)

## 2013-08-15 LAB — COMPREHENSIVE METABOLIC PANEL
AST: 19 U/L (ref 0–37)
CO2: 25 mEq/L (ref 19–32)
Calcium: 9.3 mg/dL (ref 8.4–10.5)
Creatinine, Ser: 10.27 mg/dL — ABNORMAL HIGH (ref 0.50–1.10)
GFR calc Af Amer: 4 mL/min — ABNORMAL LOW (ref >90)
GFR calc non Af Amer: 3 mL/min — ABNORMAL LOW (ref >90)
Glucose, Bld: 104 mg/dL — ABNORMAL HIGH (ref 70–99)

## 2013-08-15 LAB — GLUCOSE, CAPILLARY: Glucose-Capillary: 129 mg/dL — ABNORMAL HIGH (ref 70–99)

## 2013-08-15 LAB — HEPARIN LEVEL (UNFRACTIONATED): Heparin Unfractionated: 0.24 IU/mL — ABNORMAL LOW (ref 0.30–0.70)

## 2013-08-15 MED ORDER — HEPARIN (PORCINE) IN NACL 100-0.45 UNIT/ML-% IJ SOLN
1050.0000 [IU]/h | INTRAMUSCULAR | Status: DC
  Filled 2013-08-15 (×2): qty 250

## 2013-08-15 MED ORDER — HEPARIN (PORCINE) IN NACL 100-0.45 UNIT/ML-% IJ SOLN
900.0000 [IU]/h | INTRAMUSCULAR | Status: DC
  Administered 2013-08-15: 900 [IU]/h via INTRAVENOUS
  Filled 2013-08-15: qty 250

## 2013-08-15 MED ORDER — ALBUTEROL SULFATE (5 MG/ML) 0.5% IN NEBU: 2.5000 mg | INHALATION_SOLUTION | RESPIRATORY_TRACT | Status: DC | PRN

## 2013-08-15 MED ORDER — KETOROLAC TROMETHAMINE 30 MG/ML IJ SOLN
30.0000 mg | Freq: Once | INTRAMUSCULAR | Status: AC
  Administered 2013-08-15: 30 mg via INTRAVENOUS
  Filled 2013-08-15: qty 1

## 2013-08-15 NOTE — Progress Notes (Signed)
ANTICOAGULATION CONSULT NOTE - Initial Consult  Pharmacy Consult for heparin Indication: deep thrombosis of right IJV   Allergies  Allergen Reactions  . Phenergan [Promethazine Hcl] Other (See Comments)    Causes patient to" vomit more"  . Vitamin D Analogs Rash    Patient Measurements: Weight: 191 lb 5.8 oz (86.8 kg) (bed scale weight.. bed zeroed. ) Ht: 5' 6'' IBW= 59kg Heparin Dosing Weight: 78kg  Vital Signs: Temp: 98.5 F (36.9 C) (12/07 0742) Temp src: Oral (12/07 0742) BP: 150/48 mmHg (12/07 0800) Pulse Rate: 51 (12/07 0800)  Labs:  Recent Labs  08/13/13 1600  08/14/13 0416  08/14/13 0735 08/14/13 1005 08/14/13 1325 08/14/13 2254 08/15/13 0245  HGB 11.6*  --  10.6*  --   --   --   --   --  10.4*  HCT 35.4*  --  32.2*  --   --   --   --   --  30.9*  PLT 92*  --  95*  --   --   --   --   --  83*  CREATININE 8.69*  --  9.37*  --  9.51* 9.08*  --   --  10.27*  TROPONINI  --   < >  --   < >  --  <0.30 <0.30 <0.30  --   < > = values in this interval not displayed.  The CrCl is unknown because both a height and weight (above a minimum accepted value) are required for this calculation.   Medical History: Past Medical History  Diagnosis Date  . Hypertension   . PAD (peripheral artery disease)   . Type II diabetes mellitus   . Anemia   . History of blood transfusion 2002    "related to amputation" (08/13/2013)  . End stage renal disease on dialysis     "[facility]; M, W, F" (08/13/2013)    Medications:  Prescriptions prior to admission  Medication Sig Dispense Refill  . amLODipine (NORVASC) 10 MG tablet Take 10 mg by mouth daily.      Marland Kitchen b complex-vitamin c-folic acid (NEPHRO-VITE) 0.8 MG TABS tablet Take 1 tablet by mouth daily.      Marland Kitchen doxazosin (CARDURA) 8 MG tablet Take 8 mg by mouth at bedtime.      . hydrALAZINE (APRESOLINE) 25 MG tablet Take 2 tablets (50 mg total) by mouth 3 (three) times daily.  90 tablet  3  . labetalol (NORMODYNE)  200 MG tablet TAKE 1 TABLET BY MOUTH TWICE A DAY  60 tablet  0  . lisinopril (PRINIVIL,ZESTRIL) 40 MG tablet Take 40 mg by mouth daily.      . sevelamer carbonate (RENVELA) 800 MG tablet Take 800 mg by mouth 3 (three) times daily with meals.      . doxazosin (CARDURA) 8 MG tablet Take 1 tablet (8 mg total) by mouth at bedtime.  30 tablet  0  . labetalol (NORMODYNE) 200 MG tablet Take 200 mg by mouth 2 (two) times daily.       Scheduled:  . amLODipine  10 mg Oral Daily  . doxazosin  8 mg Oral QHS  . hydrALAZINE  50 mg Oral TID  . insulin aspart  0-15 Units Subcutaneous TID WC  . insulin aspart  0-5 Units Subcutaneous QHS  . labetalol  200 mg Oral BID  . lisinopril  40 mg Oral Daily  . multivitamin  1 tablet Oral QHS  . sevelamer carbonate  800 mg  Oral TID WC  . sodium chloride  3 mL Intravenous Q12H  . sodium chloride  3 mL Intravenous Q12H    Assessment: 67 yo female admitted for seizure activity and syncopal episodes while on dialysis. She is noted  s/p cardiac arrest on 12/6 while on HD. Patient noted with deep thrombosis of right IJV to start heparin. Recent CT of head- 'Remote right paracentral pontine infarct' and 'small area of blood breakdown products central pons probably related to episode of hemorrhagic ischemia otherwise no evidence of intracranial hemorrhage'. No heparin bolus per MD. Patient was on sq heparin and last dose was at about 5:30am today.  Goal of Therapy:  Heparin level= 0.3-0.5 Monitor platelets by anticoagulation protocol: Yes   Plan:  -Start heparin at 900 units/hr (~ 12 units/kg/hr) -Heparin level in 8 hours and daily wth CBC daily  Harland German, Pharm D 08/15/2013 11:18 AM

## 2013-08-15 NOTE — Progress Notes (Signed)
*  PRELIMINARY RESULTS* Vascular Ultrasound Carotid Duplex (Doppler) has been completed.  Preliminary findings:   Evidence of 60-79% right ICA stenosis, borderline >80%. Severe calcific plaque noted at origin of right ICA. 1-39% left ICA stenosis.  Bilateral vertebral arteries demonstrate antegrade flow, however the left is stenosed.   Incidental finding = deep thrombosis of right IJV    Farrel Demark, RDMS, RVT  08/15/2013, 10:09 AM

## 2013-08-15 NOTE — Progress Notes (Addendum)
[facility] Progress Note    Subjective: Pt without complaints today   Exam  Blood pressure 128/34, pulse 53, temperature 98.5 F (36.9 C), temperature source Oral, resp. rate 20, weight 86.8 kg (191 lb 5.8 oz), SpO2 100.00%. Gen: alert sl obese BF, NAD , appropriate  Neck: no jvd  Heart: RRR, soft 1/6 sem lsb, no rub or gallop  Lungs: CTA bilaterally  Abd: Obese, soft ,nontender, nondistended  Ext: L BKA stump no edema, trace pretibial edema R ankle  Skin: No overt rash or ulcer  Neuro: OX3, moves all extrem.  Access: Pos. Bruit LUA AVF/ R IJ perm cath   Dialysis: [facility] on MWF  3:45hrs   2/2.25 Bath     81kg    Heparin 8000    R IJ Cath and LUA AVF Zemplar 3ug    Epogen none    Venofer none  Assessment/Plan  1. S/P cardiac arrest- 15 min into inpatient HD yest morning. Also this week had 3 episodes over 2 dialysis sessions of presyncope +/- hypotension +/- chest pain at outpatient dialysis. No hx of tia, stroke, MI in past.  Looking to see if we have any tele strips from arrest yesterday. MRA + for significant diffuse disease, including the L > R supraclinoid carotids, (severe tandem stenoses left side, focal stenosis on R), and loss of anterior comm artery as well. Also has known pulm HTN from old echo.  Have asked cardiology and neurology to evaluate. Unsafe to do dialysis right now in my opinion with high risk of acute stroke or arrest I believe. Another option would be CRRT for temporary use if absolutely necessary.  I have explained to patient that with close attention to diet and fluid restrictions that we will be ok without dialysis for now while awaiting results of studies and assessments from specialists. Have d/w daughter also. Get daily labs and cxr.     2. ESRD: usual MWF , as above, last full HD 12/3 3. HTN/volume - will continue all BP meds for now except labetalol given bradycardia. Up 5kg if bed wts are accurate, no resp symptoms or signs of pulm edema.  We are also considering the possibility that she has gained body weight and dry wt needs to be raised.  4. Anemia - hgb .12 no epo or iron for now 5. Metabolic bone disease - Binders and vit d/ fu am labs 6. Nutrition - Renal diet / renal vit. 7. DM type 2 per admit 8. PAD remote L bka   Vinson Moselle MD  pager [phone]    cell [phone]  08/15/2013, 1:21 PM   Recent Labs Lab 08/14/13 0735 08/14/13 1005 08/15/13 0245  NA 137 134* 136  K 4.9 5.0 5.2*  CL 95* 94* 94*  CO2 26 25 25   GLUCOSE 123* 132* 104*  BUN 72* 66* 74*  CREATININE 9.51* 9.08* 10.27*  CALCIUM 9.3 9.6 9.3  PHOS 6.3*  --   --     Recent Labs Lab 08/13/13 0855 08/14/13 0735 08/15/13 0245  AST 23  --  19  ALT 16  --  16  ALKPHOS 85  --  77  BILITOT 0.4  --  0.3  PROT 7.4  --  6.5  ALBUMIN 3.8 3.3* 3.2*    Recent Labs Lab 08/13/13 0855 08/13/13 1600 08/14/13 0416 08/15/13 0245  WBC 8.2 6.7 6.9 6.2  NEUTROABS 6.2  --   --   --   HGB 11.0* 11.6* 10.6* 10.4*  HCT 33.6* 35.4* 32.2* 30.9*  MCV 98.0 97.5 98.2 96.9  PLT 89* 92* 95* 83*   . amLODipine  10 mg Oral Daily  . doxazosin  8 mg Oral QHS  . hydrALAZINE  50 mg Oral TID  . insulin aspart  0-15 Units Subcutaneous TID WC  . insulin aspart  0-5 Units Subcutaneous QHS  . ketorolac  30 mg Intravenous Once  . labetalol  200 mg Oral BID  . lisinopril  40 mg Oral Daily  . multivitamin  1 tablet Oral QHS  . sevelamer carbonate  800 mg Oral TID WC  . sodium chloride  3 mL Intravenous Q12H  . sodium chloride  3 mL Intravenous Q12H   . heparin 900 Units/hr (08/15/13 1235)   sodium chloride, acetaminophen, albuterol, LORazepam, ondansetron (ZOFRAN) IV, sodium chloride

## 2013-08-15 NOTE — Progress Notes (Signed)
PULMONARY  / CRITICAL CARE MEDICINE  Name: Briana Gould MRN: [id number] DOB: 1946-05-16    ADMISSION DATE:  08/13/2013 CONSULTATION DATE:  08/14/2013    REFERRING MD :  [facility] PRIMARY SERVICE:  PCCM  BRIEF PATIENT DESCRIPTION: 67 yo with ESRD on HD admitted 12/05 for hypotension during HD. Transferred to ICU 12/06 after recurrent hypotension during HD with possible cardiac arrest requiring brief CPR. Not intubated and cognitively fully intact after episode.  SIGNIFICANT EVENTS / STUDIES:  12/05  CT head:  NAD 12/06  EEG:  no seizure activity 12/06  MRI brain:  Possible but unlikely tiny L motor strip infarct, remote R pontine infarct, old blood central pons 12/06 MRA brain: multiple bilateral moderate and high grade intracranial stenoses  12/07 Carotid Dopplers: Evidence of 60-79% right ICA stenosis, borderline >80%. Severe calcific plaque noted at origin of right ICA. 1-39% left ICA stenosis. Bilateral vertebral arteries demonstrate antegrade flow, however the left is stenosed. Incidental finding of deep thrombosis of RIJ vein   LINES / TUBES: R chest tunneled HD cath - preadmission  CULTURES: Blood 12/06 >>   ANTIBIOTICS:  SUBJECTIVE:  No acute events overnight.  VITAL SIGNS: Temp:  [97.8 F (36.6 C)-98.9 F (37.2 C)] 98.5 F (36.9 C) (12/07 0742) Pulse Rate:  [52-151] 56 (12/07 0700) Resp:  [0-19] 15 (12/07 0700) BP: (98-185)/(34-98) 154/42 mmHg (12/07 0700) SpO2:  [54 %-100 %] 98 % (12/07 0700) Weight:  [86.8 kg (191 lb 5.8 oz)] 86.8 kg (191 lb 5.8 oz) (12/06 0900) HEMODYNAMICS:   VENTILATOR SETTINGS:   INTAKE / OUTPUT: Intake/Output     12/06 0701 - 12/07 0700 12/07 0701 - 12/08 0700   P.O. 300    Total Intake(mL/kg) 300 (3.5)    Total Output       Net +300            PHYSICAL EXAMINATION: General:  Resting comfortable, no distress Neuro: Awake, alert, cooperative, non focal HEENT: PERRL Cardiovascular: Regular, systolic murmur Lungs: R tunneled HD  cath Abdomen: Soft, non tender, bowel sounds present Ext: s/p L BKA, no edema   LABS: I have reviewed all of today's lab results. Relevant abnormalities are discussed in the A/P section  CXR: CM, NACPD  ASSESSMENT / PLAN:  PULMONARY A: No issues P:   Monitor  Supplemental O2 as needed  CARDIOVASCULAR A:  H/O hypertension Recurrent hypotension during HD R IJ vein thrombosis Doubt PE P:  Cont antihypertensives for now Next HD should occur in ICU Would consider holding or reducing BP meds prior to planned HD Full dose heparin started 12/07 for IJ clot  RENAL A:   ESRD P:   Mgmt per Renal  GASTROINTESTINAL A:   No issues P:   SUP: N/I Cont renal diet  HEMATOLOGIC A:   Chronic mild anemia without overt blood loss Thrombocytopenia - unclear etiology P:  DVT px: full heparin Monitor CBC intermittently Consider heparin alternative if plts continue to drop  INFECTIOUS A:   Concern for possible HD cath infection P:   Micro and abx as above Monitor off abx for now  ENDOCRINE A:  DM II P:   Cont SSI  NEUROLOGIC A:  Transient LOC during HD Severe cerebrovascular disease Chest pain after chest compressions P:   Eval and mgmt per Neuro PRN analgesics    Billy Fischer, MD ; [facility] service Mobile [phone].  After 5:30 PM or weekends, call [phone] Pulmonary and Critical Care Medicine   08/15/2013, 7:52 AM

## 2013-08-15 NOTE — Progress Notes (Signed)
  Echocardiogram 2D Echocardiogram has been performed.  [place]ian Co 08/15/2013, 1:00 PM

## 2013-08-15 NOTE — Progress Notes (Signed)
Patients weight:  08/14/13  ICU bed scale (zeroed)  85.7 kg    08/15/13 ICU bed scale  (zeroed) 86.09 Kg   08/15/13 Stand scale #[id number]    87.6  kg

## 2013-08-15 NOTE — Progress Notes (Signed)
Vascular ultrasound results called to Dr. Blima Dessert .

## 2013-08-15 NOTE — Progress Notes (Signed)
NEURO HOSPITALIST PROGRESS NOTE   SUBJECTIVE:                                                                                                                        Neurology initially saw Briana Gould regarding episodes over 2 dialysis sessions of presyncope +/- hypotension and syncope versus seizures. EEG was unremarkable but subsequently she had cardiac arrest 15 min into inpatient HD. MRI brain after cardiac arrest showed a questionable tiny area of restricted diffusion versus T2 shine through over the left motor and MRA brain revealed significant diffuse intracranial stenosis. At this moment she is resting comfortably in bed without neurological complains but complaining of chest discomfort in the area where she had CPR. Currently on IV heparin.   OBJECTIVE:                                                                                                                           Vital signs in last 24 hours: Temp:  [97.9 F (36.6 C)-98.9 F (37.2 C)] 98.3 F (36.8 C) (12/07 1542) Pulse Rate:  [51-151] 51 (12/07 1600) Resp:  [11-20] 16 (12/07 1600) BP: (120-165)/(34-121) 120/96 mmHg (12/07 1400) SpO2:  [54 %-100 %] 98 % (12/07 1600) Weight:  [85.7 kg (188 lb 15 oz)-87.6 kg (193 lb 2 oz)] 87.6 kg (193 lb 2 oz) (12/07 1500)  Intake/Output from previous day: 12/06 0701 - 12/07 0700 In: 300 [P.O.:300] Out: -  Intake/Output this shift: Total I/O In: 396 [P.O.:360; I.V.:36] Out: -  Nutritional status: Renal  Past Medical History  Diagnosis Date  . Hypertension   . PAD (peripheral artery disease)   . Type II diabetes mellitus   . Anemia   . History of blood transfusion 2002    "related to amputation" (08/13/2013)  . End stage renal disease on dialysis     "[facility]; M, W, F" (08/13/2013)    Neurologic Exam:  Mental Status:  Alert, awake, oriented, thought content appropriate. Speech fluent without evidence of  aphasia. Able to follow 3 step commands without difficulty.  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  Deep Tendon Reflexes:  Right: Upper Extremity Left: Upper extremity  biceps (C-5 to C-6) 2/4 biceps (C-5 to C-6) 2/4  tricep (C7) 2/4 triceps (C7) 2/4  Brachioradialis (C6) 2/4 Brachioradialis (C6) 2/4  Lower Extremity Lower Extremity  quadriceps (L-2 to L-4) 2/4 quadriceps (L-2 to L-4) 2/4  Achilles (S1) 2/4 Achilles (S1) BKA  Plantars:  Right: downgoing Left: BKA  Cerebellar:  normal finger-to-nose,  Gait: not tested.   Lab Results: Lab Results  Component Value Date/Time   CHOL  Value: 170        ATP III CLASSIFICATION:  <200     mg/dL   Desirable  161-096  mg/dL   Borderline High  >=045    mg/dL   High        40/98/1191  7:28 PM   Lipid Panel No results found for this basename: CHOL, TRIG, HDL, CHOLHDL, VLDL, LDLCALC,  in the last 72 hours  Studies/Results: Mr Shirlee Latch Wo Contrast  08/14/2013   CLINICAL DATA:  Diabetic hypertensive patient with end-stage renal disease on dialysis experienced a brief episode of lightheadedness and possible seizure activity well on dialysis.  EXAM: MRI HEAD WITHOUT CONTRAST  MRA HEAD WITHOUT CONTRAST  TECHNIQUE: Multiplanar, multiecho pulse sequences of the brain  and surrounding structures were obtained without intravenous contrast. Angiographic images of the head were obtained using MRA technique without contrast.  COMPARISON:  08/13/2013 CT.  No comparison MR.  FINDINGS: MRI HEAD FINDINGS  Some of the sequences are motion degraded.  Tiny area of altered signal intensity left motor strip (series 3, image 25) may represent T2 shine through rather than acute infarct however if the patient has symptoms referable to the left motor strip than a tiny infarct at this level would need to be considered. Immediately inferior to this region there is evidence of a remote small infarct.  Remote right paracentral pontine infarct. Moderate small vessel disease type changes.  Small area of blood breakdown products central pons probably related to episode of hemorrhagic ischemia otherwise no evidence of intracranial hemorrhage.  No intracranial mass lesion noted on this unenhanced exam.  Global atrophy without hydrocephalus.  Altered bone marrow signal intensity consistent with anemia of renal disease.  Cervical spondylotic changes C3-4 and C4-5  Partially empty sella incidentally noted.  Mild exophthalmos.  Minimal paranasal sinus mucosal thickening.  Partial opacification inferior left mastoid air cells.  MRA HEAD FINDINGS  Marked high-grade tandem stenosis of the pre cavernous segment of the left internal carotid artery.  High-grade stenosis majority of the A1 segment of the left anterior cerebral artery.  Moderate stenosis right internal carotid artery pre cavernous and supraclinoid segment  Moderate to marked focal stenosis proximal M1 segment right middle cerebral artery.  Moderate middle cerebral artery and A2 segment anterior cerebral artery branch vessel narrowing and irregularity.  High-grade tandem stenosis right vertebral artery.  Non visualized posterior inferior cerebellar artery bilaterally.  Nonvisualized right anterior inferior cerebellar artery.  Moderate to marked focal  narrowing  proximal left anterior inferior cerebellar artery.  Moderate long segment stenosis proximal to mid left posterior cerebral artery.  High-grade stenosis mid to distal right posterior cerebral artery.  No aneurysm noted.  IMPRESSION: Tiny area of altered signal intensity left motor strip may represent T2 shine through rather than acute infarct however if the patient has symptoms referable to the left motor strip than a tiny infarct at this level would need to be considered.  Remote right paracentral pontine infarct. Moderate small vessel disease type changes.  Small area of blood breakdown products central pons probably related to episode of hemorrhagic ischemia otherwise no evidence of intracranial hemorrhage.  Significant intracranial atherosclerotic type changes as detailed above.   Electronically Signed   By: Bridgett Larsson M.D.   On: 08/14/2013 18:04   Mr Brain Wo Contrast  08/14/2013   CLINICAL DATA:  Diabetic hypertensive patient with end-stage renal disease on dialysis experienced a brief episode of lightheadedness and possible seizure activity well on dialysis.  EXAM: MRI HEAD WITHOUT CONTRAST  MRA HEAD WITHOUT CONTRAST  TECHNIQUE: Multiplanar, multiecho pulse sequences of the brain and surrounding structures were obtained without intravenous contrast. Angiographic images of the head were obtained using MRA technique without contrast.  COMPARISON:  08/13/2013 CT.  No comparison MR.  FINDINGS: MRI HEAD FINDINGS  Some of the sequences are motion degraded.  Tiny area of altered signal intensity left motor strip (series 3, image 25) may represent T2 shine through rather than acute infarct however if the patient has symptoms referable to the left motor strip than a tiny infarct at this level would need to be considered. Immediately inferior to this region there is evidence of a remote small infarct.  Remote right paracentral pontine infarct. Moderate small vessel disease type changes.  Small area of  blood breakdown products central pons probably related to episode of hemorrhagic ischemia otherwise no evidence of intracranial hemorrhage.  No intracranial mass lesion noted on this unenhanced exam.  Global atrophy without hydrocephalus.  Altered bone marrow signal intensity consistent with anemia of renal disease.  Cervical spondylotic changes C3-4 and C4-5  Partially empty sella incidentally noted.  Mild exophthalmos.  Minimal paranasal sinus mucosal thickening.  Partial opacification inferior left mastoid air cells.  MRA HEAD FINDINGS  Marked high-grade tandem stenosis of the pre cavernous segment of the left internal carotid artery.  High-grade stenosis majority of the A1 segment of the left anterior cerebral artery.  Moderate stenosis right internal carotid artery pre cavernous and supraclinoid segment  Moderate to marked focal stenosis proximal M1 segment right middle cerebral artery.  Moderate middle cerebral artery and A2 segment anterior cerebral artery branch vessel narrowing and irregularity.  High-grade tandem stenosis right vertebral artery.  Non visualized posterior inferior cerebellar artery bilaterally.  Nonvisualized right anterior inferior cerebellar artery.  Moderate to marked focal narrowing proximal left anterior inferior cerebellar artery.  Moderate long segment stenosis proximal to mid left posterior cerebral artery.  High-grade stenosis mid to distal right posterior cerebral artery.  No aneurysm noted.  IMPRESSION: Tiny area of altered signal intensity left motor strip may represent T2 shine through rather than acute infarct however if the patient has symptoms referable to the left motor strip than a tiny infarct at this level would need to be considered.  Remote right paracentral pontine infarct. Moderate small vessel disease type changes.  Small area of blood breakdown products central pons probably related to episode of hemorrhagic ischemia otherwise no evidence of intracranial  hemorrhage.  Significant  intracranial atherosclerotic type changes as detailed above.   Electronically Signed   By: Bridgett Larsson M.D.   On: 08/14/2013 18:04   Dg Chest Port 1 View  08/15/2013   CLINICAL DATA:  Respiratory failure.  EXAM: PORTABLE CHEST - 1 VIEW  COMPARISON:  08/14/2013 and 08/13/2013, and 11/03/2011  FINDINGS: There is chronic cardiomegaly. Pulmonary vascularity is slightly more prominent than on the prior study. Double-lumen central venous catheter is unchanged. No effusions. No acute osseous abnormality.  IMPRESSION: Slight increased pulmonary vascularity since the prior study. Chronic cardiomegaly.   Electronically Signed   By: Geanie Cooley M.D.   On: 08/15/2013 08:33   Dg Chest Port 1 View  08/14/2013   CLINICAL DATA:  Unresponsive, possible aspiration  EXAM: PORTABLE CHEST - 1 VIEW  COMPARISON:  Chest x-ray August 13, 2013  FINDINGS: The lungs are adequately inflated. There is no focal infiltrate nor evidence of atelectasis. There is no pleural effusion or pneumothorax. The cardiopericardial silhouette remains enlarged. The pulmonary vascularity is not engorged. The dual-lumen dialysis type catheter appears unchanged via the right internal jugular approach. The mediastinum is normal in width. The observed portions of the bony thorax appear normal.  IMPRESSION: There is no evidence of aspiration or significant atelectasis. There is stable mild enlargement of the cardiac silhouette without evidence of pulmonary edema.   Electronically Signed   By: David  [place]   On: 08/14/2013 09:20    MEDICATIONS                                                                                                                       I have reviewed the patient's current medications.  ASSESSMENT/PLAN:                                                                                                           67 y/o with HTN, DM, CAD, ESRD dialysis dependent, status post recent cardiac arrest, and MRA  brain disclosing prominent diffuse intracranial stenotic arterial disease including moderate to marked focal stenosis proximal M1 segment right middle cerebral artery, marked high-grade tandem stenosis of the pre cavernous segment of  the left internal carotid artery, and high-grade tandem stenosis right vertebral artery. No territorial infarct on MRI despite recent episodes of hypotension and cardiac arrest make me surmise that she had developed good collaterals to circumvent the above described multifocal stenosis.  However, she is unquestionably at risk of a catastrophic stroke and unfortunately no endovascular intervention seems feasible due to the multifocal nature of her intracranial vascular disease.  Avoiding hypotension can help to prevent hypoperfusion related stroke. Will ask for the expertise of our stroke team to make further management recommendations in this regard.  Wyatt Portela, MD [facility] [phone]  08/15/2013, 4:48 PM

## 2013-08-15 NOTE — Consult Note (Signed)
CONSULT NOTE  Date: 08/15/2013               Patient Name:  Briana Gould MRN: [id number]  DOB: [birth year] Age / Sex: 67 y.o., female        PCP: [name] Primary Cardiologist: Jerral Bonito, MD            Referring Physician: Vinson Moselle, MD              Reason for Consult: "cardiac arrest" during dialysis           History of Present Illness: Patient is a 67 y.o. female with a PMHx of HTN, CAD, MR, Aortic stenosis and plumonary hypertension , who was admitted to [facility] on 08/13/2013 for evaluation of "cardiac arrest" just after the initiation of dialysis  The patient remembers getting weak and having some CP just before the event .    She has had a hx of syncope in the past ( Dec. 2011) .  Cardiac cath 09/01/10 by Dr. Gala Romney:  HEMODYNAMIC DATA:  Central aortic pressure 180/88 with a mean of 105. The LV pressure of 183/15 with an EDP of 27.  Right atrial pressure with a mean of 13.  RV pressure 74/11 with an EDP of 15.  PA pressure 79/27 with a mean of 50.  Pulmonary capillary wedge pressure with a mean of 30 with V-waves to 45.  Fick cardiac output 4.9.  Fick cardiac index 3.3. Pulmonary vascular resistance was 4.0 woods units.  Left main was normal.  LAD is a long vessel coursing to the apex gave off two diagonals.  The entire vessel had diffuse diabetic vasculopathy with approximately 40- 50% stenosis throughout.  In the midportion of the LAD, there was a focal 60% lesion in the distal apical LAD.  There was a 60-70% focal lesion.  Left circumflex was a large codominant vessel, gave off two marginal branches, two posterolaterals and a PDA.  It had a diffuse 40-50% stenosis throughout the vessel consistent with diabetic vasculopathy.  The right coronary artery was small to moderate size codominant vessel, gave off a PDA branch.  There was diffuse 40% stenosis throughout the proximal and midportion.  The distal RCA leading into a small PDA. There is a  tubular 99% stenosis.  Left ventriculogram done in the RAO position showed an EF of 55-60% with no regional wall motion abnormalities.  There was only trace mitral regurgitation visualized on panning down over the abdominal aorta after the V-gram.  There was no evidence of abdominal aortic aneurysm.  ASSESSMENT: 1. Three-vessel coronary artery disease with diffuse diabetic     vasculopathy. 2. Normal left ventricular function. 3. Markedly increased left-sided filling pressures and elevated V-     waves suggestive of severe mitral regurgitation. 4. Moderate-to-severe pulmonary hypertension.  DISCUSSION:  Ms. Bazaldua has diffuse coronary artery disease in the pattern of diabetic radiculopathy which is not amenable to revascularization either surgically or percutaneously.  There is also moderate-to-severe pulmonary hypertension which is mostly left-sided. This is in the setting of probable moderate-to-severe mitral regurgitation.  She has had several episodes of dizziness as OP during dialysis and had cardic arrest requiring CPR on Dec. 6 .    Medications: Outpatient medications: Prescriptions prior to admission  Medication Sig Dispense Refill  . amLODipine (NORVASC) 10 MG tablet Take 10 mg by mouth daily.      Marland Kitchen b complex-vitamin c-folic acid (NEPHRO-VITE) 0.8 MG TABS tablet Take 1 tablet by  mouth daily.      Marland Kitchen doxazosin (CARDURA) 8 MG tablet Take 8 mg by mouth at bedtime.      . hydrALAZINE (APRESOLINE) 25 MG tablet Take 2 tablets (50 mg total) by mouth 3 (three) times daily.  90 tablet  3  . labetalol (NORMODYNE) 200 MG tablet TAKE 1 TABLET BY MOUTH TWICE A DAY  60 tablet  0  . lisinopril (PRINIVIL,ZESTRIL) 40 MG tablet Take 40 mg by mouth daily.      . sevelamer carbonate (RENVELA) 800 MG tablet Take 800 mg by mouth 3 (three) times daily with meals.      . doxazosin (CARDURA) 8 MG tablet Take 1 tablet (8 mg total) by mouth at bedtime.  30 tablet  0  . labetalol  (NORMODYNE) 200 MG tablet Take 200 mg by mouth 2 (two) times daily.        Current medications: Current Facility-Administered Medications  Medication Dose Route Frequency Provider Last Rate Last Dose  . 0.9 %  sodium chloride infusion  250 mL Intravenous PRN Maretta Bees, MD      . acetaminophen (TYLENOL) tablet 650 mg  650 mg Oral Q6H PRN Maretta Bees, MD   650 mg at 08/15/13 0407  . albuterol (PROVENTIL) (5 MG/ML) 0.5% nebulizer solution 2.5 mg  2.5 mg Nebulization Q4H PRN Merwyn Katos, MD      . amLODipine (NORVASC) tablet 10 mg  10 mg Oral Daily Maretta Bees, MD   10 mg at 08/15/13 1046  . doxazosin (CARDURA) tablet 8 mg  8 mg Oral QHS Maretta Bees, MD   8 mg at 08/14/13 2132  . heparin ADULT infusion 100 units/mL (25000 units/250 mL)  900 Units/hr Intravenous Continuous Benny Lennert, RPH 9 mL/hr at 08/15/13 1235 900 Units/hr at 08/15/13 1235  . hydrALAZINE (APRESOLINE) tablet 50 mg  50 mg Oral TID Maretta Bees, MD   50 mg at 08/15/13 1043  . insulin aspart (novoLOG) injection 0-15 Units  0-15 Units Subcutaneous TID WC Merwyn Katos, MD   3 Units at 08/14/13 1203  . insulin aspart (novoLOG) injection 0-5 Units  0-5 Units Subcutaneous QHS Merwyn Katos, MD      . labetalol (NORMODYNE) tablet 200 mg  200 mg Oral BID Maretta Bees, MD   200 mg at 08/15/13 1043  . lisinopril (PRINIVIL,ZESTRIL) tablet 40 mg  40 mg Oral Daily Maretta Bees, MD   40 mg at 08/14/13 2131  . LORazepam (ATIVAN) injection 1 mg  1 mg Intravenous Q6H PRN Maretta Bees, MD      . multivitamin (RENA-VIT) tablet 1 tablet  1 tablet Oral QHS Maretta Bees, MD   1 tablet at 08/14/13 2131  . ondansetron (ZOFRAN) injection 4 mg  4 mg Intravenous Q6H PRN Maretta Bees, MD      . sevelamer carbonate (RENVELA) tablet 800 mg  800 mg Oral TID WC Maretta Bees, MD   800 mg at 08/15/13 0823  . sodium chloride 0.9 % injection 3 mL  3 mL Intravenous Q12H Maretta Bees, MD    3 mL at 08/15/13 1044  . sodium chloride 0.9 % injection 3 mL  3 mL Intravenous Q12H Maretta Bees, MD   3 mL at 08/14/13 2133  . sodium chloride 0.9 % injection 3 mL  3 mL Intravenous PRN Maretta Bees, MD         Allergies  Allergen Reactions  .  Phenergan [Promethazine Hcl] Other (See Comments)    Causes patient to" vomit more"  . Vitamin D Analogs Rash     Past Medical History  Diagnosis Date  . Hypertension   . PAD (peripheral artery disease)   . Type II diabetes mellitus   . Anemia   . History of blood transfusion 2002    "related to amputation" (08/13/2013)  . End stage renal disease on dialysis     "[facility]; M, W, F" (08/13/2013)    Past Surgical History  Procedure Laterality Date  . Av fistula placement Left 2011    upper arm  . Below knee leg amputation Left 2002  . Tonsillectomy    . Cataract extraction w/ intraocular lens  implant, bilateral Bilateral 2012-2014    Family History  Problem Relation Age of Onset  . Hypertension Mother   . Hypertension Father     Social History:  reports that she has never smoked. She has never used smokeless tobacco. She reports that she does not drink alcohol or use illicit drugs.   Review of Systems: Constitutional:  denies fever, chills, diaphoresis, appetite change and fatigue.  HEENT: denies photophobia, eye pain, redness, hearing loss, ear pain, congestion, sore throat, rhinorrhea, sneezing, neck pain, neck stiffness and tinnitus.  Respiratory: denies SOB, DOE, cough, chest tightness, and wheezing.  Cardiovascular: admits to chest pain during dialysis on occasion  Gastrointestinal: denies nausea, vomiting, abdominal pain, diarrhea, constipation, blood in stool.  Genitourinary: denies dysuria, urgency, frequency, hematuria, flank pain and difficulty urinating.  Musculoskeletal: denies  myalgias, back pain, joint swelling, arthralgias and gait problem.   Skin: denies pallor, rash and wound.    Neurological: admits to dizziness, seizures, syncope occasionally with dialysis  Hematological: denies adenopathy, easy bruising, personal or family bleeding history.  Psychiatric/ Behavioral: denies suicidal ideation, mood changes, confusion, nervousness, sleep disturbance and agitation.    Physical Exam: BP 128/34  Pulse 53  Temp(Src) 98.5 F (36.9 C) (Oral)  Resp 20  Wt 191 lb 5.8 oz (86.8 kg)  SpO2 100%  General: Vital signs reviewed and noted.    NAD   Head: Normocephalic, atraumatic, sclera anicteric, mucus membranes are moist  Neck: Supple. Negative for carotid bruits. Elevated JVD  , carotids are 1+ and blunted  Lungs:  Clear bilaterally to auscultation without wheezes, rales, or rhonchi. Breathing is unlabored.  Heart: RR, S1, S2, 2/6 systolic murmur at LSB  Abdomen:  Soft, non-tender, non-distended with normoactive bowel sounds. No hepatomegaly. No rebound/guarding. No obvious abdominal masses  MSK: Strength and the appear normal for age.  Extremities: No clubbing or cyanosis.  S/p left BKA  Neurologic: Alert and oriented X 3. Moves all extremities spontaneously.  Psych: Responds to questions appropriately with a normal affect.    Lab results: Basic Metabolic Panel:  Recent Labs Lab 08/14/13 0735 08/14/13 1005 08/15/13 0245  NA 137 134* 136  K 4.9 5.0 5.2*  CL 95* 94* 94*  CO2 26 25 25   GLUCOSE 123* 132* 104*  BUN 72* 66* 74*  CREATININE 9.51* 9.08* 10.27*  CALCIUM 9.3 9.6 9.3  PHOS 6.3*  --   --     Liver Function Tests:  Recent Labs Lab 08/13/13 0855 08/14/13 0735 08/15/13 0245  AST 23  --  19  ALT 16  --  16  ALKPHOS 85  --  77  BILITOT 0.4  --  0.3  PROT 7.4  --  6.5  ALBUMIN 3.8 3.3* 3.2*   No  results found for this basename: LIPASE, AMYLASE,  in the last 168 hours No results found for this basename: AMMONIA,  in the last 168 hours  CBC:  Recent Labs Lab 08/13/13 0855 08/13/13 1600 08/14/13 0416 08/15/13 0245  WBC 8.2 6.7 6.9 6.2   NEUTROABS 6.2  --   --   --   HGB 11.0* 11.6* 10.6* 10.4*  HCT 33.6* 35.4* 32.2* 30.9*  MCV 98.0 97.5 98.2 96.9  PLT 89* 92* 95* 83*    Cardiac Enzymes:  Recent Labs Lab 08/13/13 2125 08/14/13 0446 08/14/13 1005 08/14/13 1325 08/14/13 2254  TROPONINI <0.30 <0.30 <0.30 <0.30 <0.30    BNP: No components found with this basename: POCBNP,   CBG:  Recent Labs Lab 08/14/13 1109 08/14/13 1516 08/14/13 1921 08/15/13 0708 08/15/13 1056  GLUCAP 132* 86 168* 114* 173*    Coagulation Studies: No results found for this basename: LABPROT, INR,  in the last 72 hours   Other results:  Tele:  NSR    Imaging: Mr Shirlee Latch Wo Contrast  08/14/2013   CLINICAL DATA:  Diabetic hypertensive patient with end-stage renal disease on dialysis experienced a brief episode of lightheadedness and possible seizure activity well on dialysis.  EXAM: MRI HEAD WITHOUT CONTRAST  MRA HEAD WITHOUT CONTRAST  TECHNIQUE: Multiplanar, multiecho pulse sequences of the brain and surrounding structures were obtained without intravenous contrast. Angiographic images of the head were obtained using MRA technique without contrast.  COMPARISON:  08/13/2013 CT.  No comparison MR.  FINDINGS: MRI HEAD FINDINGS  Some of the sequences are motion degraded.  Tiny area of altered signal intensity left motor strip (series 3, image 25) may represent T2 shine through rather than acute infarct however if the patient has symptoms referable to the left motor strip than a tiny infarct at this level would need to be considered. Immediately inferior to this region there is evidence of a remote small infarct.  Remote right paracentral pontine infarct. Moderate small vessel disease type changes.  Small area of blood breakdown products central pons probably related to episode of hemorrhagic ischemia otherwise no evidence of intracranial hemorrhage.  No intracranial mass lesion noted on this unenhanced exam.  Global atrophy without  hydrocephalus.  Altered bone marrow signal intensity consistent with anemia of renal disease.  Cervical spondylotic changes C3-4 and C4-5  Partially empty sella incidentally noted.  Mild exophthalmos.  Minimal paranasal sinus mucosal thickening.  Partial opacification inferior left mastoid air cells.  MRA HEAD FINDINGS  Marked high-grade tandem stenosis of the pre cavernous segment of the left internal carotid artery.  High-grade stenosis majority of the A1 segment of the left anterior cerebral artery.  Moderate stenosis right internal carotid artery pre cavernous and supraclinoid segment  Moderate to marked focal stenosis proximal M1 segment right middle cerebral artery.  Moderate middle cerebral artery and A2 segment anterior cerebral artery branch vessel narrowing and irregularity.  High-grade tandem stenosis right vertebral artery.  Non visualized posterior inferior cerebellar artery bilaterally.  Nonvisualized right anterior inferior cerebellar artery.  Moderate to marked focal narrowing proximal left anterior inferior cerebellar artery.  Moderate long segment stenosis proximal to mid left posterior cerebral artery.  High-grade stenosis mid to distal right posterior cerebral artery.  No aneurysm noted.  IMPRESSION: Tiny area of altered signal intensity left motor strip may represent T2 shine through rather than acute infarct however if the patient has symptoms referable to the left motor strip than a tiny infarct at this level would need to  be considered.  Remote right paracentral pontine infarct. Moderate small vessel disease type changes.  Small area of blood breakdown products central pons probably related to episode of hemorrhagic ischemia otherwise no evidence of intracranial hemorrhage.  Significant intracranial atherosclerotic type changes as detailed above.   Electronically Signed   By: Bridgett Larsson M.D.   On: 08/14/2013 18:04   Mr Brain Wo Contrast  08/14/2013   CLINICAL DATA:  Diabetic hypertensive  patient with end-stage renal disease on dialysis experienced a brief episode of lightheadedness and possible seizure activity well on dialysis.  EXAM: MRI HEAD WITHOUT CONTRAST  MRA HEAD WITHOUT CONTRAST  TECHNIQUE: Multiplanar, multiecho pulse sequences of the brain and surrounding structures were obtained without intravenous contrast. Angiographic images of the head were obtained using MRA technique without contrast.  COMPARISON:  08/13/2013 CT.  No comparison MR.  FINDINGS: MRI HEAD FINDINGS  Some of the sequences are motion degraded.  Tiny area of altered signal intensity left motor strip (series 3, image 25) may represent T2 shine through rather than acute infarct however if the patient has symptoms referable to the left motor strip than a tiny infarct at this level would need to be considered. Immediately inferior to this region there is evidence of a remote small infarct.  Remote right paracentral pontine infarct. Moderate small vessel disease type changes.  Small area of blood breakdown products central pons probably related to episode of hemorrhagic ischemia otherwise no evidence of intracranial hemorrhage.  No intracranial mass lesion noted on this unenhanced exam.  Global atrophy without hydrocephalus.  Altered bone marrow signal intensity consistent with anemia of renal disease.  Cervical spondylotic changes C3-4 and C4-5  Partially empty sella incidentally noted.  Mild exophthalmos.  Minimal paranasal sinus mucosal thickening.  Partial opacification inferior left mastoid air cells.  MRA HEAD FINDINGS  Marked high-grade tandem stenosis of the pre cavernous segment of the left internal carotid artery.  High-grade stenosis majority of the A1 segment of the left anterior cerebral artery.  Moderate stenosis right internal carotid artery pre cavernous and supraclinoid segment  Moderate to marked focal stenosis proximal M1 segment right middle cerebral artery.  Moderate middle cerebral artery and A2 segment  anterior cerebral artery branch vessel narrowing and irregularity.  High-grade tandem stenosis right vertebral artery.  Non visualized posterior inferior cerebellar artery bilaterally.  Nonvisualized right anterior inferior cerebellar artery.  Moderate to marked focal narrowing proximal left anterior inferior cerebellar artery.  Moderate long segment stenosis proximal to mid left posterior cerebral artery.  High-grade stenosis mid to distal right posterior cerebral artery.  No aneurysm noted.  IMPRESSION: Tiny area of altered signal intensity left motor strip may represent T2 shine through rather than acute infarct however if the patient has symptoms referable to the left motor strip than a tiny infarct at this level would need to be considered.  Remote right paracentral pontine infarct. Moderate small vessel disease type changes.  Small area of blood breakdown products central pons probably related to episode of hemorrhagic ischemia otherwise no evidence of intracranial hemorrhage.  Significant intracranial atherosclerotic type changes as detailed above.   Electronically Signed   By: Bridgett Larsson M.D.   On: 08/14/2013 18:04   Dg Chest Port 1 View  08/15/2013   CLINICAL DATA:  Respiratory failure.  EXAM: PORTABLE CHEST - 1 VIEW  COMPARISON:  08/14/2013 and 08/13/2013, and 11/03/2011  FINDINGS: There is chronic cardiomegaly. Pulmonary vascularity is slightly more prominent than on the prior study. Double-lumen central venous  catheter is unchanged. No effusions. No acute osseous abnormality.  IMPRESSION: Slight increased pulmonary vascularity since the prior study. Chronic cardiomegaly.   Electronically Signed   By: Geanie Cooley M.D.   On: 08/15/2013 08:33   Dg Chest Port 1 View  08/14/2013   CLINICAL DATA:  Unresponsive, possible aspiration  EXAM: PORTABLE CHEST - 1 VIEW  COMPARISON:  Chest x-ray August 13, 2013  FINDINGS: The lungs are adequately inflated. There is no focal infiltrate nor evidence of  atelectasis. There is no pleural effusion or pneumothorax. The cardiopericardial silhouette remains enlarged. The pulmonary vascularity is not engorged. The dual-lumen dialysis type catheter appears unchanged via the right internal jugular approach. The mediastinum is normal in width. The observed portions of the bony thorax appear normal.  IMPRESSION: There is no evidence of aspiration or significant atelectasis. There is stable mild enlargement of the cardiac silhouette without evidence of pulmonary edema.   Electronically Signed   By: David  [place]   On: 08/14/2013 09:20       Assessment & Plan:  1. S/p cardiac arrest during dialysis.  I do not have any tele strips just before the episode yesterday.  There are notations that she was asystolic but it's unlikely that she had a primary conduction issues at the onset of dialysis.  More likely, she had a sudden decrease of venous return that was insufficient to keep her systolic BP maintained.  She has pulmonary hypertension (by cath in 2011) which would require that she have a higher CVP to maintain flow from the right side to left side.    In addition, she has at least moderate AS ( echo was just done) which would also minimize the forward cardiac output.  In addition, she has significant cerebral vascular disease.  She is likely not getting much cerebral perfusion when her blood pressure is marginal.    She has had a cath and has diffuse 3 vessel CAD.  She was thought to NOT be a candidate for coronary revascularization by cath Dec. 24, 2011.      I have discussed the case with Dr. Arlean Hopping    Dr. Myrtis Ser will see her tomorrow.     Vesta Mixer, Montez Hageman., MD, [facility] 08/15/2013, 12:38 PM

## 2013-08-15 NOTE — Progress Notes (Signed)
ANTICOAGULATION CONSULT NOTE - Follow Up Consult  Pharmacy Consult for heparin  Indication: deep thrombosis of right IJV   Allergies  Allergen Reactions  . Phenergan [Promethazine Hcl] Other (See Comments)    Causes patient to" vomit more"  . Vitamin D Analogs Rash    Patient Measurements: Weight: 193 lb 2 oz (87.6 kg) (stand scale no. 2 from 6700) Heparin Dosing Weight: 78kg  Vital Signs: Temp: 99.2 F (37.3 C) (12/07 2006) Temp src: Oral (12/07 2006) BP: 127/58 mmHg (12/07 1800) Pulse Rate: 60 (12/07 1800)  Labs:  Recent Labs  08/13/13 1600  08/14/13 0416  08/14/13 0735 08/14/13 1005 08/14/13 1325 08/14/13 2254 08/15/13 0245 08/15/13 2020  HGB 11.6*  --  10.6*  --   --   --   --   --  10.4*  --   HCT 35.4*  --  32.2*  --   --   --   --   --  30.9*  --   PLT 92*  --  95*  --   --   --   --   --  83*  --   HEPARINUNFRC  --   --   --   --   --   --   --   --   --  0.24*  CREATININE 8.69*  --  9.37*  --  9.51* 9.08*  --   --  10.27*  --   TROPONINI  --   < >  --   < >  --  <0.30 <0.30 <0.30  --   --   < > = values in this interval not displayed.  The CrCl is unknown because both a height and weight (above a minimum accepted value) are required for this calculation.   Medications:  Scheduled:  . amLODipine  10 mg Oral Daily  . doxazosin  8 mg Oral QHS  . hydrALAZINE  50 mg Oral TID  . insulin aspart  0-15 Units Subcutaneous TID WC  . insulin aspart  0-5 Units Subcutaneous QHS  . lisinopril  40 mg Oral Daily  . multivitamin  1 tablet Oral QHS  . sevelamer carbonate  800 mg Oral TID WC  . sodium chloride  3 mL Intravenous Q12H  . sodium chloride  3 mL Intravenous Q12H   Infusions:  . heparin 900 Units/hr (08/15/13 1235)    Assessment: 67 yo female admitted for seizure activity and syncopal episodes while on dialysis. She is noted s/p cardiac arrest on 12/6 while on HD. Patient noted with deep thrombosis of right IJV on 12/7 and pharmacy was asked to dose  heparin . The initial heparin level is 0.24 on 900 units/hr.  Recent MRA of head- 'Remote right paracentral pontine infarct' and 'small area of blood breakdown products central pons probably related to episode of hemorrhagic ischemia otherwise no evidence of intracranial hemorrhage'. Neurology also following.  Goal of Therapy:  Heparin level= 0.3-0.5  Monitor platelets by anticoagulation protocol: Yes   Plan:   -Increase heparin to to 1050 units/hr -Heparin level in am  Harland German, Pharm D 08/15/2013 8:59 PM

## 2013-08-16 DIAGNOSIS — Z87898 Personal history of other specified conditions: Secondary | ICD-10-CM

## 2013-08-16 DIAGNOSIS — I350 Nonrheumatic aortic (valve) stenosis: Secondary | ICD-10-CM | POA: Diagnosis present

## 2013-08-16 DIAGNOSIS — I679 Cerebrovascular disease, unspecified: Secondary | ICD-10-CM

## 2013-08-16 DIAGNOSIS — I2510 Atherosclerotic heart disease of native coronary artery without angina pectoris: Secondary | ICD-10-CM | POA: Diagnosis present

## 2013-08-16 LAB — RENAL FUNCTION PANEL
Albumin: 3.5 g/dL (ref 3.5–5.2)
CO2: 21 mEq/L (ref 19–32)
Chloride: 92 mEq/L — ABNORMAL LOW (ref 96–112)
Creatinine, Ser: 12.09 mg/dL — ABNORMAL HIGH (ref 0.50–1.10)
GFR calc Af Amer: 3 mL/min — ABNORMAL LOW (ref >90)
Glucose, Bld: 154 mg/dL — ABNORMAL HIGH (ref 70–99)
Phosphorus: 7.9 mg/dL — ABNORMAL HIGH (ref 2.3–4.6)
Potassium: 6.4 mEq/L (ref 3.5–5.1)
Sodium: 133 mEq/L — ABNORMAL LOW (ref 135–145)

## 2013-08-16 LAB — GLUCOSE, CAPILLARY
Glucose-Capillary: 145 mg/dL — ABNORMAL HIGH (ref 70–99)
Glucose-Capillary: 123 mg/dL — ABNORMAL HIGH (ref 70–99)

## 2013-08-16 LAB — CBC
HCT: 33.4 % — ABNORMAL LOW (ref 36.0–46.0)
Hemoglobin: 11.2 g/dL — ABNORMAL LOW (ref 12.0–15.0)
MCH: 32.1 pg (ref 26.0–34.0)
MCV: 95.7 fL (ref 78.0–100.0)
Platelets: 100 10*3/uL — ABNORMAL LOW (ref 150–400)
RBC: 3.49 MIL/uL — ABNORMAL LOW (ref 3.87–5.11)
RDW: 14.8 % (ref 11.5–15.5)
WBC: 6.5 10*3/uL (ref 4.0–10.5)

## 2013-08-16 MED ORDER — ASPIRIN 81 MG PO CHEW
81.0000 mg | CHEWABLE_TABLET | Freq: Every day | ORAL | Status: DC
  Administered 2013-08-16: 81 mg via ORAL
  Filled 2013-08-16: qty 1

## 2013-08-16 MED ORDER — ONDANSETRON HCL 4 MG/2ML IJ SOLN
4.0000 mg | INTRAMUSCULAR | Status: DC | PRN
  Administered 2013-08-16 – 2013-08-18 (×8): 4 mg via INTRAVENOUS
  Filled 2013-08-16 (×9): qty 2

## 2013-08-16 MED ORDER — CLOPIDOGREL BISULFATE 75 MG PO TABS
75.0000 mg | ORAL_TABLET | Freq: Every day | ORAL | Status: DC
  Administered 2013-08-17: 75 mg via ORAL
  Filled 2013-08-16 (×2): qty 1

## 2013-08-16 MED ORDER — SODIUM POLYSTYRENE SULFONATE 15 GM/60ML PO SUSP
45.0000 g | Freq: Once | ORAL | Status: AC
  Administered 2013-08-16: 45 g via ORAL
  Filled 2013-08-16: qty 180

## 2013-08-16 MED ORDER — ENOXAPARIN SODIUM 30 MG/0.3ML ~~LOC~~ SOLN
30.0000 mg | SUBCUTANEOUS | Status: DC
  Administered 2013-08-16: 30 mg via SUBCUTANEOUS
  Filled 2013-08-16 (×2): qty 0.3

## 2013-08-16 MED ORDER — NOREPINEPHRINE BITARTRATE 1 MG/ML IJ SOLN
0.0000 ug/min | INTRAMUSCULAR | Status: DC
  Filled 2013-08-16 (×2): qty 16

## 2013-08-16 NOTE — Progress Notes (Signed)
PULMONARY  / CRITICAL CARE MEDICINE  Name: Briana Gould MRN: [id number] DOB: [birth year]    ADMISSION DATE:  08/13/2013 CONSULTATION DATE:  08/14/2013    REFERRING MD :  [facility] PRIMARY SERVICE:  PCCM  BRIEF PATIENT DESCRIPTION: 67 yo with ESRD on HD admitted 12/05 for hypotension during HD. Transferred to ICU 12/06 after recurrent hypotension during HD with possible cardiac arrest requiring brief CPR. Not intubated and cognitively fully intact after episode.  SIGNIFICANT EVENTS / STUDIES:  12/05  CT head:  NAD 12/06  EEG:  no seizure activity 12/06  MRI brain:  Possible but unlikely tiny L motor strip infarct, remote R pontine infarct, old blood central pons 12/06 MRA brain: multiple bilateral moderate and high grade intracranial stenoses  12/07 Echo: moderate LVH, grade 2 diastolic dysfunction. RVSP est 45 mmHg 12/07 Carotid Dopplers: Evidence of 60-79% right ICA stenosis, borderline >80%. Severe calcific plaque noted at origin of right ICA. 1-39% left ICA stenosis. Bilateral vertebral arteries demonstrate antegrade flow, however the left is stenosed. Incidental finding of deep thrombosis of R IJ vein   LINES / TUBES: R chest tunneled HD cath - preadmission  CULTURES: Blood 12/06 >> 1/3 GPRs (likely contaminant) >>   ANTIBIOTICS:  SUBJECTIVE:  Nausea. No distress  VITAL SIGNS: Temp:  [98.2 F (36.8 C)-99.2 F (37.3 C)] 99.1 F (37.3 C) (12/08 1100) Pulse Rate:  [51-70] 63 (12/08 1500) Resp:  [6-24] 15 (12/08 1500) BP: (127-186)/(32-134) 185/44 mmHg (12/08 1500) SpO2:  [94 %-100 %] 100 % (12/08 1500) Weight:  [85.1 kg (187 lb 9.8 oz)] 85.1 kg (187 lb 9.8 oz) (12/08 1100) HEMODYNAMICS:   VENTILATOR SETTINGS:   INTAKE / OUTPUT: Intake/Output     12/07 0701 - 12/08 0700 12/08 0701 - 12/09 0700   P.O. 360    I.V. (mL/kg) 187.5 (2.1) 36.8 (0.4)   Total Intake(mL/kg) 547.5 (6.3) 36.8 (0.4)   Urine (mL/kg/hr) 3 (0)    Emesis/NG output 3 (0)    Total Output 6     Net  +541.5 +36.8          PHYSICAL EXAMINATION: General:  Resting comfortable, no distress Neuro: Awake, alert, cooperative, non focal HEENT: PERRL Cardiovascular: Regular, systolic murmur Lungs: R tunneled HD cath Abdomen: Soft, non tender, bowel sounds present Ext: s/p L BKA, no edema   LABS: I have reviewed all of today's lab results. Relevant abnormalities are discussed in the A/P section  CXR: NNF  ASSESSMENT / PLAN:  PULMONARY A: No issues P:   Monitor  Supplemental O2 as needed  CARDIOVASCULAR A:  H/O hypertension Recurrent symptomaitc hypotension during HD R IJ vein thrombosis P:  Cont antihypertensives for now Hold hydralazine today prior to HD Have NE available during HD with goal of maintaining SBP > 115 mmHg Will discuss need for full anticoagulation with VVS in light of dual antiplatelet agents recommended by Stroke team  RENAL A:   ESRD P:   Mgmt per Renal  GASTROINTESTINAL A:   Nausea - likely uremic P:   SUP: N/I Cont renal diet PRN ondansetron  HEMATOLOGIC A:   Chronic mild anemia without overt blood loss Thrombocytopenia - unclear etiology. Improving P:  DVT px: enoxaparin Monitor CBC intermittently Consider full anticoagulation for R IJ vein clot  INFECTIOUS A:   No overt infections P:   Micro and abx as above Monitor off abx for now  ENDOCRINE A:  DM II P:   Cont SSI  NEUROLOGIC A:  Recurrent LOC during HD  Severe cerebrovascular disease Chest pain after chest compressions P:   Eval and mgmt per Neuro/Stroke appreciated Dual antiplatelet agents recommended  - ASA and clopidogrel started 12/08 PRN analgesics   Watch in ICU today. If tolerates HD, can transfer to [facility] 12/09. Need to resolve issue of anticoagulation.   Billy Fischer, MD ; [facility] [phone].  After 5:30 PM or weekends, call [phone] Pulmonary and Critical Care Medicine   08/16/2013, 3:07 PM

## 2013-08-16 NOTE — Progress Notes (Signed)
ANTICOAGULATION CONSULT NOTE - Follow Up Consult  Pharmacy Consult for heparin Indication: deep thrombosis of right IJV  Labs:  Recent Labs  08/14/13 0416  08/14/13 1005 08/14/13 1325 08/14/13 2254 08/15/13 0245 08/15/13 2020 08/16/13 0545  HGB 10.6*  --   --   --   --  10.4*  --  11.2*  HCT 32.2*  --   --   --   --  30.9*  --  33.4*  PLT 95*  --   --   --   --  83*  --  100*  HEPARINUNFRC  --   --   --   --   --   --  0.24* 0.32  CREATININE 9.37*  < > 9.08*  --   --  10.27*  --  12.09*  TROPONINI  --   < > <0.30 <0.30 <0.30  --   --   --   < > = values in this interval not displayed.   Assessment/Plan:  67yo female now therapeutic on heparin after rate increase.  Will continue gtt at current rate and confirm stable with additional level.  Vernard Gambles, PharmD, BCPS  08/16/2013,7:18 AM

## 2013-08-16 NOTE — Procedures (Signed)
Patient seen on Hemodialysis. QB 400, UF goal 1500----UF cut off at SBP 110 Treatment adjusted as needed.  Zetta Bills MD [facility]. Office # [phone] Pager # [phone] 11:41 AM

## 2013-08-16 NOTE — Progress Notes (Signed)
Stroke Team Progress Note  HISTORY Briana Gould is an 67 y.o. female who was at dialysis today 08/13/2013 when she felt as though her blood pressure was dropping (no documentation of what her BP was). During this episode she recalls people calling her name and feeling "out of it" but does not believe she fainted. She feels episode lasted for maybe 3 minutes and was fully alert after the episode. Similar episode occurred once when she had just started dialysis and last Wednesday when she dropped her BP during dialysis. When this occurred on Wednesday, she noted they stopped removing fluid and she returned to her baseline. She denies prior seizure history herself or in her family. Currently she is back to her baseline.   Patient was not considered for TPA candidate as she was nonfocal on admission. She was admitted  for further evaluation and treatment.  SUBJECTIVE Her family is at the bedside.    OBJECTIVE Most recent Vital Signs: Filed Vitals:   08/16/13 0442 08/16/13 0500 08/16/13 0600 08/16/13 0755  BP:  167/50 163/48   Pulse:  53 56   Temp: 98.2 F (36.8 C)   98.3 F (36.8 C)  TempSrc: Oral   Oral  Resp:  18 10   Weight:      SpO2:  98% 94%    CBG (last 3)   Recent Labs  08/15/13 1708 08/15/13 2317 08/16/13 0746  GLUCAP 82 129* 147*    IV Fluid Intake:   . heparin 1,050 Units/hr (08/15/13 2100)  . norepinephrine (LEVOPHED) Adult infusion      MEDICATIONS  . amLODipine  10 mg Oral Daily  . doxazosin  8 mg Oral QHS  . hydrALAZINE  50 mg Oral TID  . insulin aspart  0-15 Units Subcutaneous TID WC  . insulin aspart  0-5 Units Subcutaneous QHS  . lisinopril  40 mg Oral Daily  . multivitamin  1 tablet Oral QHS  . sevelamer carbonate  800 mg Oral TID WC  . sodium chloride  3 mL Intravenous Q12H  . sodium chloride  3 mL Intravenous Q12H   PRN:  sodium chloride, acetaminophen, albuterol, LORazepam, ondansetron (ZOFRAN) IV, sodium chloride  Diet:  Renal thin liquids  with FR Activity:  Up with assistance DVT Prophylaxis:  None ordered  CLINICALLY SIGNIFICANT STUDIES Basic Metabolic Panel:  Recent Labs Lab 08/14/13 0735  08/15/13 0245 08/16/13 0545  NA 137  < > 136 133*  K 4.9  < > 5.2* 6.4*  CL 95*  < > 94* 92*  CO2 26  < > 25 21  GLUCOSE 123*  < > 104* 154*  BUN 72*  < > 74* 88*  CREATININE 9.51*  < > 10.27* 12.09*  CALCIUM 9.3  < > 9.3 9.7  PHOS 6.3*  --   --  7.9*  < > = values in this interval not displayed. Liver Function Tests:  Recent Labs Lab 08/13/13 0855  08/15/13 0245 08/16/13 0545  AST 23  --  19  --   ALT 16  --  16  --   ALKPHOS 85  --  77  --   BILITOT 0.4  --  0.3  --   PROT 7.4  --  6.5  --   ALBUMIN 3.8  < > 3.2* 3.5  < > = values in this interval not displayed. CBC:  Recent Labs Lab 08/13/13 0855  08/15/13 0245 08/16/13 0545  WBC 8.2  < > 6.2 6.5  NEUTROABS 6.2  --   --   --  HGB 11.0*  < > 10.4* 11.2*  HCT 33.6*  < > 30.9* 33.4*  MCV 98.0  < > 96.9 95.7  PLT 89*  < > 83* 100*  < > = values in this interval not displayed. Coagulation: No results found for this basename: LABPROT, INR,  in the last 168 hours Cardiac Enzymes:  Recent Labs Lab 08/14/13 1005 08/14/13 1325 08/14/13 2254  TROPONINI <0.30 <0.30 <0.30   Urinalysis: No results found for this basename: COLORURINE, APPERANCEUR, LABSPEC, PHURINE, GLUCOSEU, HGBUR, BILIRUBINUR, KETONESUR, PROTEINUR, UROBILINOGEN, NITRITE, LEUKOCYTESUR,  in the last 168 hours Lipid Panel    Component Value Date/Time   CHOL  Value: 170        ATP III CLASSIFICATION:  <200     mg/dL   Desirable  161-096  mg/dL   Borderline High  >=045    mg/dL   High        40/98/1191 1928   TRIG 71 08/29/2010 1928   HDL 56 08/29/2010 1928   CHOLHDL 3.0 08/29/2010 1928   VLDL 14 08/29/2010 1928   LDLCALC  Value: 100        Total Cholesterol/HDL:CHD Risk Coronary Heart Disease Risk Table                     Men   Women  1/2 Average Risk   3.4   3.3  Average Risk       5.0   4.4  2  X Average Risk   9.6   7.1  3 X Average Risk  23.4   11.0        Use the calculated Patient Ratio above and the CHD Risk Table to determine the patient's CHD Risk.        ATP III CLASSIFICATION (LDL):  <100     mg/dL   Optimal  478-295  mg/dL   Near or Above                    Optimal  130-159  mg/dL   Borderline  621-308  mg/dL   High  >657     mg/dL   Very High* 84/69/6295 1928   HgbA1C  Lab Results  Component Value Date   HGBA1C 6.9* 11/04/2011    Urine Drug Screen:   No results found for this basename: labopia, cocainscrnur, labbenz, amphetmu, thcu, labbarb    Alcohol Level: No results found for this basename: ETH,  in the last 168 hours    CT of the brain  08/13/2013 Mild diffuse cortical atrophy. No acute intracranial abnormality seen.  MRI of the brain  08/14/2013   Tiny area of altered signal intensity left motor strip may represent T2 shine through rather than acute infarct however if the patient has symptoms referable to the left motor strip than a tiny infarct at this level would need to be considered.  Remote right paracentral pontine infarct. Moderate small vessel disease type changes.    MRA of the brain  08/14/2013   Small area of blood breakdown products central pons probably related to episode of hemorrhagic ischemia otherwise no evidence of intracranial hemorrhage.  Significant intracranial atherosclerotic type changes.  2D Echocardiogram  Moderate left ventricular hypertrophy. No obvious source of embolus  Carotid Doppler  Evidence of 60-79% right ICA stenosis, borderline >80%. Severe calcific plaque noted at origin of right ICA. 1-39% left ICA stenosis.  Bilateral vertebral arteries demonstrate antegrade flow, however the left is stenosed.  Incidental finding =  deep thrombosis of right IJV  CXR  08/15/2013    Slight increased pulmonary vascularity since the prior study. Chronic cardiomegaly.    EKG  LBBB, left axis deviation, undetermined rhythm.   EEG no seizure  activity  Therapy Recommendations   Physical Exam   Mental Status:  Alert, awake, oriented, thought content appropriate. Speech fluent without evidence of aphasia. Able to follow 3 step commands without difficulty.  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  Deep Tendon Reflexes:  Right: Upper Extremity Left: Upper extremity  biceps (C-5 to C-6) 2/4 biceps (C-5 to C-6) 2/4  tricep (C7) 2/4 triceps (C7) 2/4  Brachioradialis (C6) 2/4 Brachioradialis (C6) 2/4  Lower Extremity Lower Extremity  quadriceps (L-2 to L-4) 2/4 quadriceps (L-2 to L-4) 2/4  Achilles (S1) 2/4 Achilles (S1) BKA  Plantars:  Right: downgoing Left: BKA  Cerebellar:  normal finger-to-nose,  Gait: not tested.   ASSESSMENT Ms. Briana Gould is a 67 y.o. female presenting with syncope during HD.  Imaging negative for acute  Left frontal infarct. MRA confirms prominent diffuse intracranial arterial disease with cervical stenosis proximal R M1, L ICA precavernous stenosis and high-grade R vertebral artery stenosis. Given multiple stenosis, patient is at high risk for stroke. Patient was on no antithrombotics prior to admission. Now on no antithrombotics for secondary stroke prevention.    Evidence of R ICA 60-79% ICA stenosis,  borderline >80%.   Incidental finding = deep thrombosis of right IJV  Hospital day # 3  TREATMENT/PLAN  Dual antiplatelets:  aspirin 81 mg orally every day and clopidogrel 75 mg orally every day for secondary stroke prevention X3 months then once alone - should produce anti-coagulation be started for right IJ clot, will adjust recommendations  Avoid hypotension during hemodialysis  Avoid excessive fluid removal during hemodialysis  Agree with plans to hold antihypertensives prior to dialysis  Therapy evals  Annie Main, MSN, RN, ANVP-BC, ANP-BC, GNP-BC [facility] Pager: [phone] 08/16/2013 10:26 AM  I have personally obtained a history, examined the patient, evaluated imaging results, and formulated the assessment and plan of care. I agree with the above. Delia Heady, MD

## 2013-08-16 NOTE — Progress Notes (Signed)
Patient ID: Fe Swanston-Boom, female   DOB: [birth year], 67 y.o.   MRN: [id number]  [facility] Progress Note   Assessment/ Plan:   1. S/P cardiac arrest- 15 min into HD Saturday. Also last week had 3 episodes of presyncope +/- hypotension +/- chest pain at 2 sessions of outpatient dialysis. MRA significant for diffuse disease, including the L > R supraclinoid carotids, (severe tandem stenoses left side, focal stenosis on R), and loss of anterior comm artery as well. No obvious targets for surgical intervention noted at this time. She also has pulmonary HTN and mild AS with diffuse CAD and no targets for intervention. Will give SPE for hyperkalemia and re-evaluate this PM for HD vs CRRT (Latter may be safer with recent history).  2.ESRD: Last HD cut short after on Saturday after arrest- plan for CRRT v/s HD later today after seen by stroke team 3. Anemia: Hgb at goal for ESRD (off ESA) 4. CKD-MBD: High phosphorus- will increase binder dose 5. Nutrition: Low albumin, will start ONS soon 6. Hypertension: BP elevated but tolerated due to problems with cerebral perfusion  Subjective:   Reports some nausea this morning- otherwise feels fair.    Objective:   BP 163/48  Pulse 56  Temp(Src) 98.3 F (36.8 C) (Oral)  Resp 10  Wt 87.6 kg (193 lb 2 oz)  SpO2 94%  Physical Exam: AOZ:[id number] sitting up in bed, sister by bedside EXB:MWUXL regular bradycardia, HSM over outflow tract Resp:CTA bilaterally, no rales KGM:WNUU, obese, NT, BS normal VOZ:DGUYQ LE edema  Labs: BMET  Recent Labs Lab 08/13/13 0855 08/13/13 1600 08/14/13 0416 08/14/13 0735 08/14/13 1005 08/15/13 0245 08/16/13 0545  NA 136  --  137 137 134* 136 133*  K 4.3  --  5.3* 4.9 5.0 5.2* 6.4*  CL 93*  --  96 95* 94* 94* 92*  CO2 26  --  26 26 25 25 21   GLUCOSE 176*  --  133* 123* 132* 104* 154*  BUN 55*  --  72* 72* 66* 74* 88*  CREATININE 8.32* 8.69* 9.37* 9.51* 9.08* 10.27* 12.09*  CALCIUM  10.2  --  9.5 9.3 9.6 9.3 9.7  PHOS  --   --   --  6.3*  --   --  7.9*   CBC  Recent Labs Lab 08/13/13 0855 08/13/13 1600 08/14/13 0416 08/15/13 0245 08/16/13 0545  WBC 8.2 6.7 6.9 6.2 6.5  NEUTROABS 6.2  --   --   --   --   HGB 11.0* 11.6* 10.6* 10.4* 11.2*  HCT 33.6* 35.4* 32.2* 30.9* 33.4*  MCV 98.0 97.5 98.2 96.9 95.7  PLT 89* 92* 95* 83* 100*   Medications:    . amLODipine  10 mg Oral Daily  . doxazosin  8 mg Oral QHS  . hydrALAZINE  50 mg Oral TID  . insulin aspart  0-15 Units Subcutaneous TID WC  . insulin aspart  0-5 Units Subcutaneous QHS  . lisinopril  40 mg Oral Daily  . multivitamin  1 tablet Oral QHS  . sevelamer carbonate  800 mg Oral TID WC  . sodium chloride  3 mL Intravenous Q12H  . sodium chloride  3 mL Intravenous Q12H  . sodium polystyrene  45 g Oral Once   Zetta Bills, MD 08/16/2013, 8:08 AM

## 2013-08-16 NOTE — Progress Notes (Signed)
Had a 4hr hemodialysis Tx. Goal was 1L. Pt was often nauseated during the Tx.

## 2013-08-16 NOTE — Progress Notes (Signed)
[name], female   DOB: Oct 07, 1945, 67 y.o.   MRN: [id number]    SUBJECTIVE:  The patient appears to be stable this morning. There is no chest pain or shortness of breath.   Filed Vitals:   08/16/13 0400 08/16/13 0442 08/16/13 0500 08/16/13 0600  BP: 143/33  167/50 163/48  Pulse: 54  53 56  Temp:  98.2 F (36.8 C)    TempSrc:  Oral    Resp: 6  18 10   Weight:      SpO2:   98% 94%     Intake/Output Summary (Last 24 hours) at 08/16/13 0755 Last data filed at 08/16/13 0600  Gross per 24 hour  Intake    537 ml  Output      6 ml  Net    531 ml    LABS: Basic Metabolic Panel:  Recent Labs  65/78/46 0735  08/15/13 0245 08/16/13 0545  NA 137  < > 136 133*  K 4.9  < > 5.2* 6.4*  CL 95*  < > 94* 92*  CO2 26  < > 25 21  GLUCOSE 123*  < > 104* 154*  BUN 72*  < > 74* 88*  CREATININE 9.51*  < > 10.27* 12.09*  CALCIUM 9.3  < > 9.3 9.7  PHOS 6.3*  --   --  7.9*  < > = values in this interval not displayed. Liver Function Tests:  Recent Labs  08/13/13 0855  08/15/13 0245 08/16/13 0545  AST 23  --  19  --   ALT 16  --  16  --   ALKPHOS 85  --  77  --   BILITOT 0.4  --  0.3  --   PROT 7.4  --  6.5  --   ALBUMIN 3.8  < > 3.2* 3.5  < > = values in this interval not displayed. No results found for this basename: LIPASE, AMYLASE,  in the last 72 hours CBC:  Recent Labs  08/13/13 0855  08/15/13 0245 08/16/13 0545  WBC 8.2  < > 6.2 6.5  NEUTROABS 6.2  --   --   --   HGB 11.0*  < > 10.4* 11.2*  HCT 33.6*  < > 30.9* 33.4*  MCV 98.0  < > 96.9 95.7  PLT 89*  < > 83* 100*  < > = values in this interval not displayed. Cardiac Enzymes:  Recent Labs  08/14/13 1005 08/14/13 1325 08/14/13 2254  TROPONINI <0.30 <0.30 <0.30   BNP: No components found with this basename: POCBNP,  D-Dimer: No results found for this basename: DDIMER,  in the last 72 hours Hemoglobin A1C: No results found for this basename: HGBA1C,  in the last 72 hours Fasting  Lipid Panel: No results found for this basename: CHOL, HDL, LDLCALC, TRIG, CHOLHDL, LDLDIRECT,  in the last 72 hours Thyroid Function Tests: No results found for this basename: TSH, T4TOTAL, FREET3, T3FREE, THYROIDAB,  in the last 72 hours  RADIOLOGY: Dg Chest 1 View  08/13/2013   CLINICAL DATA:  67 year old female with acute onset diffusion shortness of breath and chest pain while in dialysis. Initial encounter.  EXAM: CHEST - 1 VIEW  COMPARISON:  11/03/2011 and earlier.  FINDINGS: Portable AP upright view at 0923 hrs. Right IJ approach dual lumen dialysis catheter in place. Stable lung volumes. Stable cardiomegaly and mediastinal contours. No pneumothorax, pulmonary edema, pleural effusion or consolidation. Overall decreased pulmonary vascularity compared to the previous exam. Multiple wires  an EKG leads overlie the chest.  IMPRESSION: No acute cardiopulmonary abnormality.   Electronically Signed   By: Augusto Gamble M.D.   On: 08/13/2013 10:06   Ct Head Wo Contrast  08/13/2013   CLINICAL DATA:  Loss of consciousness, seizure.  EXAM: CT HEAD WITHOUT CONTRAST  TECHNIQUE: Contiguous axial images were obtained from the base of the skull through the vertex without intravenous contrast.  COMPARISON:  August 29, 2010.  FINDINGS: Bony calvarium appears intact. Diffuse cortical atrophy is noted. No mass effect or midline shift is noted. Ventricular size is within normal limits. There is no evidence of mass lesion, hemorrhage or acute infarction.  IMPRESSION: Mild diffuse cortical atrophy. No acute intracranial abnormality seen.   Electronically Signed   By: Roque Lias M.D.   On: 08/13/2013 09:58   Mr Shirlee Latch Wo Contrast  08/14/2013   CLINICAL DATA:  Diabetic hypertensive patient with end-stage renal disease on dialysis experienced a brief episode of lightheadedness and possible seizure activity well on dialysis.  EXAM: MRI HEAD WITHOUT CONTRAST  MRA HEAD WITHOUT CONTRAST  TECHNIQUE: Multiplanar, multiecho  pulse sequences of the brain and surrounding structures were obtained without intravenous contrast. Angiographic images of the head were obtained using MRA technique without contrast.  COMPARISON:  08/13/2013 CT.  No comparison MR.  FINDINGS: MRI HEAD FINDINGS  Some of the sequences are motion degraded.  Tiny area of altered signal intensity left motor strip (series 3, image 25) may represent T2 shine through rather than acute infarct however if the patient has symptoms referable to the left motor strip than a tiny infarct at this level would need to be considered. Immediately inferior to this region there is evidence of a remote small infarct.  Remote right paracentral pontine infarct. Moderate small vessel disease type changes.  Small area of blood breakdown products central pons probably related to episode of hemorrhagic ischemia otherwise no evidence of intracranial hemorrhage.  No intracranial mass lesion noted on this unenhanced exam.  Global atrophy without hydrocephalus.  Altered bone marrow signal intensity consistent with anemia of renal disease.  Cervical spondylotic changes C3-4 and C4-5  Partially empty sella incidentally noted.  Mild exophthalmos.  Minimal paranasal sinus mucosal thickening.  Partial opacification inferior left mastoid air cells.  MRA HEAD FINDINGS  Marked high-grade tandem stenosis of the pre cavernous segment of the left internal carotid artery.  High-grade stenosis majority of the A1 segment of the left anterior cerebral artery.  Moderate stenosis right internal carotid artery pre cavernous and supraclinoid segment  Moderate to marked focal stenosis proximal M1 segment right middle cerebral artery.  Moderate middle cerebral artery and A2 segment anterior cerebral artery branch vessel narrowing and irregularity.  High-grade tandem stenosis right vertebral artery.  Non visualized posterior inferior cerebellar artery bilaterally.  Nonvisualized right anterior inferior cerebellar  artery.  Moderate to marked focal narrowing proximal left anterior inferior cerebellar artery.  Moderate long segment stenosis proximal to mid left posterior cerebral artery.  High-grade stenosis mid to distal right posterior cerebral artery.  No aneurysm noted.  IMPRESSION: Tiny area of altered signal intensity left motor strip may represent T2 shine through rather than acute infarct however if the patient has symptoms referable to the left motor strip than a tiny infarct at this level would need to be considered.  Remote right paracentral pontine infarct. Moderate small vessel disease type changes.  Small area of blood breakdown products central pons probably related to episode of hemorrhagic ischemia otherwise no evidence  of intracranial hemorrhage.  Significant intracranial atherosclerotic type changes as detailed above.   Electronically Signed   By: Bridgett Larsson M.D.   On: 08/14/2013 18:04   Mr Brain Wo Contrast  08/14/2013   CLINICAL DATA:  Diabetic hypertensive patient with end-stage renal disease on dialysis experienced a brief episode of lightheadedness and possible seizure activity well on dialysis.  EXAM: MRI HEAD WITHOUT CONTRAST  MRA HEAD WITHOUT CONTRAST  TECHNIQUE: Multiplanar, multiecho pulse sequences of the brain and surrounding structures were obtained without intravenous contrast. Angiographic images of the head were obtained using MRA technique without contrast.  COMPARISON:  08/13/2013 CT.  No comparison MR.  FINDINGS: MRI HEAD FINDINGS  Some of the sequences are motion degraded.  Tiny area of altered signal intensity left motor strip (series 3, image 25) may represent T2 shine through rather than acute infarct however if the patient has symptoms referable to the left motor strip than a tiny infarct at this level would need to be considered. Immediately inferior to this region there is evidence of a remote small infarct.  Remote right paracentral pontine infarct. Moderate small vessel  disease type changes.  Small area of blood breakdown products central pons probably related to episode of hemorrhagic ischemia otherwise no evidence of intracranial hemorrhage.  No intracranial mass lesion noted on this unenhanced exam.  Global atrophy without hydrocephalus.  Altered bone marrow signal intensity consistent with anemia of renal disease.  Cervical spondylotic changes C3-4 and C4-5  Partially empty sella incidentally noted.  Mild exophthalmos.  Minimal paranasal sinus mucosal thickening.  Partial opacification inferior left mastoid air cells.  MRA HEAD FINDINGS  Marked high-grade tandem stenosis of the pre cavernous segment of the left internal carotid artery.  High-grade stenosis majority of the A1 segment of the left anterior cerebral artery.  Moderate stenosis right internal carotid artery pre cavernous and supraclinoid segment  Moderate to marked focal stenosis proximal M1 segment right middle cerebral artery.  Moderate middle cerebral artery and A2 segment anterior cerebral artery branch vessel narrowing and irregularity.  High-grade tandem stenosis right vertebral artery.  Non visualized posterior inferior cerebellar artery bilaterally.  Nonvisualized right anterior inferior cerebellar artery.  Moderate to marked focal narrowing proximal left anterior inferior cerebellar artery.  Moderate long segment stenosis proximal to mid left posterior cerebral artery.  High-grade stenosis mid to distal right posterior cerebral artery.  No aneurysm noted.  IMPRESSION: Tiny area of altered signal intensity left motor strip may represent T2 shine through rather than acute infarct however if the patient has symptoms referable to the left motor strip than a tiny infarct at this level would need to be considered.  Remote right paracentral pontine infarct. Moderate small vessel disease type changes.  Small area of blood breakdown products central pons probably related to episode of hemorrhagic ischemia otherwise  no evidence of intracranial hemorrhage.  Significant intracranial atherosclerotic type changes as detailed above.   Electronically Signed   By: Bridgett Larsson M.D.   On: 08/14/2013 18:04   Dg Chest Port 1 View  08/15/2013   CLINICAL DATA:  Respiratory failure.  EXAM: PORTABLE CHEST - 1 VIEW  COMPARISON:  08/14/2013 and 08/13/2013, and 11/03/2011  FINDINGS: There is chronic cardiomegaly. Pulmonary vascularity is slightly more prominent than on the prior study. Double-lumen central venous catheter is unchanged. No effusions. No acute osseous abnormality.  IMPRESSION: Slight increased pulmonary vascularity since the prior study. Chronic cardiomegaly.   Electronically Signed   By: Violeta Gelinas.D.  On: 08/15/2013 08:33   Dg Chest Port 1 View  08/14/2013   CLINICAL DATA:  Unresponsive, possible aspiration  EXAM: PORTABLE CHEST - 1 VIEW  COMPARISON:  Chest x-ray August 13, 2013  FINDINGS: The lungs are adequately inflated. There is no focal infiltrate nor evidence of atelectasis. There is no pleural effusion or pneumothorax. The cardiopericardial silhouette remains enlarged. The pulmonary vascularity is not engorged. The dual-lumen dialysis type catheter appears unchanged via the right internal jugular approach. The mediastinum is normal in width. The observed portions of the bony thorax appear normal.  IMPRESSION: There is no evidence of aspiration or significant atelectasis. There is stable mild enlargement of the cardiac silhouette without evidence of pulmonary edema.   Electronically Signed   By: David  [place]   On: 08/14/2013 09:20    PHYSICAL EXAM  patient is oriented to person time and place. Affect is normal. There is no jugulovenous distention. Lungs reveal scattered rhonchi. Cardiac exam reveals S1 and S2. There no clicks or significant murmurs.   TELEMETRY: I have reviewed telemetry today August 16, 2013. There is sinus rhythm. No marked bradycardia or tachycardia has been seen.   ASSESSMENT  AND PLAN:    HYPERTENSION    PULMONARY HYPERTENSION     Pulmonary artery pressure approximately 60 mm mercury by echo, December, 2014    MITRAL REGURGITATION      Mild by echo, December, 2014    ESRD (end stage renal disease) on dialysis   PAD (peripheral artery disease)    Unresponsive episode      CPR was done.  However there is no documentation of what the rhythm was. Etiology remains unclear. It is my understanding that there was no documented arrhythmia. Echo done yesterday reveals an ejection fraction of 55%.    Hypotension   Thrombocytopenia   R Jugular vein thrombosis    Cerebrovascular disease     There is severe cerebrovascular disease. Neurology has commented on high-risk anatomy.    CAD (coronary artery disease)     Patient has severe coronary disease documented by catheterization in 2011. There is diffuse diabetic disease. It is felt that medical therapy was recommended at that point. The patient was not a candidate for either CABG or PCI.    History of syncope     There was an episode in 2011. Etiology was never clear.    Aortic stenosis      There is mild aortic stenosis by echo yesterday.  Ejection fraction    EF yesterday by echo was 55%. There is hypokinesis at the base of the inferior wall. There is significant LVH. The left ventricle is not dilated.  At this point etiology of the events remains unclear. So far there has been no documented cardiac etiology. The patient has severe coronary disease. However left ventricular function is good. Unfortunately we are not sure what the patient's rhythm was during her worse episode. She has had other episodes with decreased blood pressure and decreased consciousness. It seems that the best approach for now is to do everything possible to keep her blood pressure from going down. Further evaluation by neurology is needed to help assess her high-risk anatomy.   Willa Rough 08/16/2013 7:55 AM

## 2013-08-17 DIAGNOSIS — R569 Unspecified convulsions: Secondary | ICD-10-CM

## 2013-08-17 DIAGNOSIS — I82890 Acute embolism and thrombosis of other specified veins: Secondary | ICD-10-CM

## 2013-08-17 DIAGNOSIS — I2510 Atherosclerotic heart disease of native coronary artery without angina pectoris: Secondary | ICD-10-CM

## 2013-08-17 DIAGNOSIS — I679 Cerebrovascular disease, unspecified: Secondary | ICD-10-CM

## 2013-08-17 LAB — GLUCOSE, CAPILLARY
Glucose-Capillary: 134 mg/dL — ABNORMAL HIGH (ref 70–99)
Glucose-Capillary: 124 mg/dL — ABNORMAL HIGH (ref 70–99)
Glucose-Capillary: 118 mg/dL — ABNORMAL HIGH (ref 70–99)

## 2013-08-17 LAB — CULTURE, BLOOD (SINGLE)

## 2013-08-17 LAB — RENAL FUNCTION PANEL
BUN: 41 mg/dL — ABNORMAL HIGH (ref 6–23)
Chloride: 92 mEq/L — ABNORMAL LOW (ref 96–112)
Creatinine, Ser: 6.98 mg/dL — ABNORMAL HIGH (ref 0.50–1.10)
GFR calc Af Amer: 6 mL/min — ABNORMAL LOW (ref >90)
Glucose, Bld: 132 mg/dL — ABNORMAL HIGH (ref 70–99)
Phosphorus: 7.1 mg/dL — ABNORMAL HIGH (ref 2.3–4.6)
Potassium: 4.7 mEq/L (ref 3.5–5.1)
Sodium: 137 mEq/L (ref 135–145)

## 2013-08-17 LAB — CBC
HCT: 32.3 % — ABNORMAL LOW (ref 36.0–46.0)
Hemoglobin: 10.8 g/dL — ABNORMAL LOW (ref 12.0–15.0)
MCH: 32 pg (ref 26.0–34.0)
MCHC: 33.4 g/dL (ref 30.0–36.0)
MCV: 95.6 fL (ref 78.0–100.0)
RBC: 3.38 MIL/uL — ABNORMAL LOW (ref 3.87–5.11)
RDW: 14.9 % (ref 11.5–15.5)

## 2013-08-17 LAB — HEPARIN LEVEL (UNFRACTIONATED): Heparin Unfractionated: 0.18 IU/mL — ABNORMAL LOW (ref 0.30–0.70)

## 2013-08-17 MED ORDER — WARFARIN VIDEO
Freq: Once | Status: AC
  Administered 2013-08-17: 18:00:00

## 2013-08-17 MED ORDER — SEVELAMER CARBONATE 800 MG PO TABS
2400.0000 mg | ORAL_TABLET | Freq: Three times a day (TID) | ORAL | Status: DC
  Filled 2013-08-17 (×12): qty 3

## 2013-08-17 MED ORDER — WARFARIN SODIUM 5 MG PO TABS
5.0000 mg | ORAL_TABLET | Freq: Once | ORAL | Status: AC
  Administered 2013-08-17: 5 mg via ORAL
  Filled 2013-08-17: qty 1

## 2013-08-17 MED ORDER — NEPRO/CARBSTEADY PO LIQD
237.0000 mL | ORAL | Status: DC
  Administered 2013-08-17 – 2013-08-19 (×3): 237 mL via ORAL
  Filled 2013-08-17 (×2): qty 237

## 2013-08-17 MED ORDER — WARFARIN - PHARMACIST DOSING INPATIENT
Freq: Every day | Status: DC
  Administered 2013-08-17 – 2013-08-20 (×2)

## 2013-08-17 MED ORDER — ALUMINUM HYDROXIDE GEL 320 MG/5ML PO SUSP
30.0000 mL | Freq: Four times a day (QID) | ORAL | Status: DC | PRN
Start: 2013-08-17 — End: 2013-08-19
  Administered 2013-08-17: 30 mL via ORAL
  Filled 2013-08-17 (×2): qty 30

## 2013-08-17 MED ORDER — COUMADIN BOOK
Freq: Once | Status: AC
  Administered 2013-08-17: 18:00:00
  Filled 2013-08-17: qty 1

## 2013-08-17 MED ORDER — METOCLOPRAMIDE HCL 5 MG/ML IJ SOLN
5.0000 mg | Freq: Four times a day (QID) | INTRAMUSCULAR | Status: DC | PRN
  Administered 2013-08-17: 5 mg via INTRAVENOUS
  Filled 2013-08-17: qty 1

## 2013-08-17 MED ORDER — HEPARIN (PORCINE) IN NACL 100-0.45 UNIT/ML-% IJ SOLN
1300.0000 [IU]/h | INTRAMUSCULAR | Status: DC
  Administered 2013-08-17: 1050 [IU]/h via INTRAVENOUS
  Administered 2013-08-18 – 2013-08-20 (×4): 1300 [IU]/h via INTRAVENOUS
  Filled 2013-08-17 (×5): qty 250

## 2013-08-17 NOTE — Progress Notes (Signed)
Pt continues to refuse food and renvela- encouraged PO intake. Pt with small amount of tan emesis with PM meds. Pt given zofran, will continue to monitor.

## 2013-08-17 NOTE — Progress Notes (Signed)
PULMONARY  / CRITICAL CARE MEDICINE  Name: Briana Gould MRN: [id number] DOB: 11-Nov-1945    ADMISSION DATE:  08/13/2013 CONSULTATION DATE:  08/14/2013    REFERRING MD :  [facility] PRIMARY SERVICE:  PCCM  BRIEF PATIENT DESCRIPTION: 67 yo with ESRD on HD admitted 12/05 for hypotension during HD. Transferred to ICU 12/06 after recurrent hypotension during HD with possible cardiac arrest requiring brief CPR. Not intubated and cognitively fully intact after episode.  SIGNIFICANT EVENTS / STUDIES:  12/05  CT head:  NAD 12/06  EEG:  no seizure activity 12/06  MRI brain:  Possible but unlikely tiny L motor strip infarct, remote R pontine infarct, old blood central pons 12/06 MRA brain: multiple bilateral moderate and high grade intracranial stenoses  12/07 Echo: moderate LVH, grade 2 diastolic dysfunction. RVSP est 45 mmHg 12/07 Carotid Dopplers: Evidence of 60-79% right ICA stenosis, borderline >80%. Severe calcific plaque noted at origin of right ICA. 1-39% left ICA stenosis. Bilateral vertebral arteries demonstrate antegrade flow, however the left is stenosed. Incidental finding of deep thrombosis of R IJ vein 12/08 Tolerated HD without hypotension 12/09 Transfer to med-surg. TRH to resume primary care duties 12/10  LINES / TUBES: R chest tunneled HD cath - preadmission  CULTURES: Blood 12/06 >> 1/3 diptheroids (contaminant)  ANTIBIOTICS:  SUBJECTIVE:  Continued nausea. No distress  VITAL SIGNS: Temp:  [98.7 F (37.1 C)-99.8 F (37.7 C)] 99.7 F (37.6 C) (12/09 1608) Pulse Rate:  [60-70] 67 (12/09 1608) Resp:  [12-22] 18 (12/09 1608) BP: (143-195)/(41-83) 167/68 mmHg (12/09 1608) SpO2:  [96 %-100 %] 97 % (12/09 1608) HEMODYNAMICS:   VENTILATOR SETTINGS:   INTAKE / OUTPUT: Intake/Output     12/08 0701 - 12/09 0700 12/09 0701 - 12/10 0700   P.O. 60    I.V. (mL/kg) 169.8 (2) 41.8 (0.5)   Total Intake(mL/kg) 229.8 (2.7) 41.8 (0.5)   Urine (mL/kg/hr)     Emesis/NG output 200  (0.1)    Other 791 (0.4)    Total Output 991     Net -761.3 +41.8          PHYSICAL EXAMINATION: General:  Resting comfortable, no distress Neuro: Awake, alert, cooperative, non focal HEENT: PERRL Cardiovascular: Regular, systolic murmur Lungs: R tunneled HD cath Abdomen: Soft, non tender, bowel sounds present Ext: s/p L BKA, no edema   LABS: I have reviewed all of today's lab results. Relevant abnormalities are discussed in the A/P section  CXR: NNF  ASSESSMENT / PLAN:  PULMONARY A: No issues P:   Monitor  Supplemental O2 as needed  CARDIOVASCULAR A:  H/O hypertension Recurrent symptomaitc hypotension during HD R IJ vein thrombosis P:  Cont current antihypertensives Hold hydralazine mornings of HD days Have NE available during HD with goal of maintaining SBP > 110 mmHg Since will need full anticoagulation with hep> warfarin, Neuro recs no asa/clopidogrel  RENAL A:   ESRD P:   Mgmt per Renal  GASTROINTESTINAL A:   Nausea - likely uremic P:   SUP: N/I Cont renal diet PRN ondansetron PRN metoclopramide  HEMATOLOGIC A:   Chronic mild anemia without overt blood loss Thrombocytopenia - unclear etiology. Improving R IJ CVL catheter assoc thrombosis P:  Resume full dose heparin Begin warfarin 3-6 months anticoagulation Consider repeat Doppler of R IJ prior to DC of anti-coagulation Monitor CBC intermittently  INFECTIOUS A:   No overt infections "Positive" BC is contaminant P:   Micro and abx as above Monitor off abx for now  ENDOCRINE A:  DM II P:   Cont SSI  NEUROLOGIC A:  Recurrent LOC during HD  Severe cerebrovascular disease Chest pain after chest compressions P:   Eval and mgmt per Neuro/Stroke appreciated PRN analgesics   Transfer to med-surg. TRH to resume primary duties 12/10 and PCCM to sign off  Billy Fischer, MD ; [facility] [phone].  After 5:30 PM or weekends, call [phone] Pulmonary and Critical Care  Medicine   08/17/2013, 5:17 PM

## 2013-08-17 NOTE — Progress Notes (Signed)
[name], female   DOB: 1946/03/18, 67 y.o.   MRN: [id number]    SUBJECTIVE:  The patient is stable. The rhythm has remained stable. To this point there has been no evidence of a cardiac abnormality causing her spells. Neurology has made specific recommendations concerning meds and not allowing her blood pressure to drop if possible.   Filed Vitals:   08/17/13 0431 08/17/13 0500 08/17/13 0600 08/17/13 0905  BP:  173/54 167/49   Pulse:  63 62   Temp: 99.5 F (37.5 C)   98.7 F (37.1 C)  TempSrc: Oral   Oral  Resp:  19 19   Weight:      SpO2:  97% 96%      Intake/Output Summary (Last 24 hours) at 08/17/13 0930 Last data filed at 08/17/13 0600  Gross per 24 hour  Intake 198.75 ml  Output    991 ml  Net -792.25 ml    LABS: Basic Metabolic Panel:  Recent Labs  40/98/11 0245 08/16/13 0545  NA 136 133*  K 5.2* 6.4*  CL 94* 92*  CO2 25 21  GLUCOSE 104* 154*  BUN 74* 88*  CREATININE 10.27* 12.09*  CALCIUM 9.3 9.7  PHOS  --  7.9*   Liver Function Tests:  Recent Labs  08/15/13 0245 08/16/13 0545  AST 19  --   ALT 16  --   ALKPHOS 77  --   BILITOT 0.3  --   PROT 6.5  --   ALBUMIN 3.2* 3.5   No results found for this basename: LIPASE, AMYLASE,  in the last 72 hours CBC:  Recent Labs  08/16/13 0545 08/17/13 0355  WBC 6.5 7.2  HGB 11.2* 10.8*  HCT 33.4* 32.3*  MCV 95.7 95.6  PLT 100* 121*   Cardiac Enzymes:  Recent Labs  08/14/13 1005 08/14/13 1325 08/14/13 2254  TROPONINI <0.30 <0.30 <0.30   BNP: No components found with this basename: POCBNP,  D-Dimer: No results found for this basename: DDIMER,  in the last 72 hours Hemoglobin A1C: No results found for this basename: HGBA1C,  in the last 72 hours Fasting Lipid Panel: No results found for this basename: CHOL, HDL, LDLCALC, TRIG, CHOLHDL, LDLDIRECT,  in the last 72 hours Thyroid Function Tests: No results found for this basename: TSH, T4TOTAL, FREET3, T3FREE, THYROIDAB,   in the last 72 hours  RADIOLOGY: Dg Chest 1 View  08/13/2013   CLINICAL DATA:  67 year old female with acute onset diffusion shortness of breath and chest pain while in dialysis. Initial encounter.  EXAM: CHEST - 1 VIEW  COMPARISON:  11/03/2011 and earlier.  FINDINGS: Portable AP upright view at 0923 hrs. Right IJ approach dual lumen dialysis catheter in place. Stable lung volumes. Stable cardiomegaly and mediastinal contours. No pneumothorax, pulmonary edema, pleural effusion or consolidation. Overall decreased pulmonary vascularity compared to the previous exam. Multiple wires an EKG leads overlie the chest.  IMPRESSION: No acute cardiopulmonary abnormality.   Electronically Signed   By: Augusto Gamble M.D.   On: 08/13/2013 10:06   Ct Head Wo Contrast  08/13/2013   CLINICAL DATA:  Loss of consciousness, seizure.  EXAM: CT HEAD WITHOUT CONTRAST  TECHNIQUE: Contiguous axial images were obtained from the base of the skull through the vertex without intravenous contrast.  COMPARISON:  August 29, 2010.  FINDINGS: Bony calvarium appears intact. Diffuse cortical atrophy is noted. No mass effect or midline shift is noted. Ventricular size is within normal limits. There is no evidence of  mass lesion, hemorrhage or acute infarction.  IMPRESSION: Mild diffuse cortical atrophy. No acute intracranial abnormality seen.   Electronically Signed   By: Roque Lias M.D.   On: 08/13/2013 09:58   Mr Shirlee Latch Wo Contrast  08/14/2013   CLINICAL DATA:  Diabetic hypertensive patient with end-stage renal disease on dialysis experienced a brief episode of lightheadedness and possible seizure activity well on dialysis.  EXAM: MRI HEAD WITHOUT CONTRAST  MRA HEAD WITHOUT CONTRAST  TECHNIQUE: Multiplanar, multiecho pulse sequences of the brain and surrounding structures were obtained without intravenous contrast. Angiographic images of the head were obtained using MRA technique without contrast.  COMPARISON:  08/13/2013 CT.  No  comparison MR.  FINDINGS: MRI HEAD FINDINGS  Some of the sequences are motion degraded.  Tiny area of altered signal intensity left motor strip (series 3, image 25) may represent T2 shine through rather than acute infarct however if the patient has symptoms referable to the left motor strip than a tiny infarct at this level would need to be considered. Immediately inferior to this region there is evidence of a remote small infarct.  Remote right paracentral pontine infarct. Moderate small vessel disease type changes.  Small area of blood breakdown products central pons probably related to episode of hemorrhagic ischemia otherwise no evidence of intracranial hemorrhage.  No intracranial mass lesion noted on this unenhanced exam.  Global atrophy without hydrocephalus.  Altered bone marrow signal intensity consistent with anemia of renal disease.  Cervical spondylotic changes C3-4 and C4-5  Partially empty sella incidentally noted.  Mild exophthalmos.  Minimal paranasal sinus mucosal thickening.  Partial opacification inferior left mastoid air cells.  MRA HEAD FINDINGS  Marked high-grade tandem stenosis of the pre cavernous segment of the left internal carotid artery.  High-grade stenosis majority of the A1 segment of the left anterior cerebral artery.  Moderate stenosis right internal carotid artery pre cavernous and supraclinoid segment  Moderate to marked focal stenosis proximal M1 segment right middle cerebral artery.  Moderate middle cerebral artery and A2 segment anterior cerebral artery branch vessel narrowing and irregularity.  High-grade tandem stenosis right vertebral artery.  Non visualized posterior inferior cerebellar artery bilaterally.  Nonvisualized right anterior inferior cerebellar artery.  Moderate to marked focal narrowing proximal left anterior inferior cerebellar artery.  Moderate long segment stenosis proximal to mid left posterior cerebral artery.  High-grade stenosis mid to distal right  posterior cerebral artery.  No aneurysm noted.  IMPRESSION: Tiny area of altered signal intensity left motor strip may represent T2 shine through rather than acute infarct however if the patient has symptoms referable to the left motor strip than a tiny infarct at this level would need to be considered.  Remote right paracentral pontine infarct. Moderate small vessel disease type changes.  Small area of blood breakdown products central pons probably related to episode of hemorrhagic ischemia otherwise no evidence of intracranial hemorrhage.  Significant intracranial atherosclerotic type changes as detailed above.   Electronically Signed   By: Bridgett Larsson M.D.   On: 08/14/2013 18:04   Mr Brain Wo Contrast  08/14/2013   CLINICAL DATA:  Diabetic hypertensive patient with end-stage renal disease on dialysis experienced a brief episode of lightheadedness and possible seizure activity well on dialysis.  EXAM: MRI HEAD WITHOUT CONTRAST  MRA HEAD WITHOUT CONTRAST  TECHNIQUE: Multiplanar, multiecho pulse sequences of the brain and surrounding structures were obtained without intravenous contrast. Angiographic images of the head were obtained using MRA technique without contrast.  COMPARISON:  08/13/2013 CT.  No comparison MR.  FINDINGS: MRI HEAD FINDINGS  Some of the sequences are motion degraded.  Tiny area of altered signal intensity left motor strip (series 3, image 25) may represent T2 shine through rather than acute infarct however if the patient has symptoms referable to the left motor strip than a tiny infarct at this level would need to be considered. Immediately inferior to this region there is evidence of a remote small infarct.  Remote right paracentral pontine infarct. Moderate small vessel disease type changes.  Small area of blood breakdown products central pons probably related to episode of hemorrhagic ischemia otherwise no evidence of intracranial hemorrhage.  No intracranial mass lesion noted on this  unenhanced exam.  Global atrophy without hydrocephalus.  Altered bone marrow signal intensity consistent with anemia of renal disease.  Cervical spondylotic changes C3-4 and C4-5  Partially empty sella incidentally noted.  Mild exophthalmos.  Minimal paranasal sinus mucosal thickening.  Partial opacification inferior left mastoid air cells.  MRA HEAD FINDINGS  Marked high-grade tandem stenosis of the pre cavernous segment of the left internal carotid artery.  High-grade stenosis majority of the A1 segment of the left anterior cerebral artery.  Moderate stenosis right internal carotid artery pre cavernous and supraclinoid segment  Moderate to marked focal stenosis proximal M1 segment right middle cerebral artery.  Moderate middle cerebral artery and A2 segment anterior cerebral artery branch vessel narrowing and irregularity.  High-grade tandem stenosis right vertebral artery.  Non visualized posterior inferior cerebellar artery bilaterally.  Nonvisualized right anterior inferior cerebellar artery.  Moderate to marked focal narrowing proximal left anterior inferior cerebellar artery.  Moderate long segment stenosis proximal to mid left posterior cerebral artery.  High-grade stenosis mid to distal right posterior cerebral artery.  No aneurysm noted.  IMPRESSION: Tiny area of altered signal intensity left motor strip may represent T2 shine through rather than acute infarct however if the patient has symptoms referable to the left motor strip than a tiny infarct at this level would need to be considered.  Remote right paracentral pontine infarct. Moderate small vessel disease type changes.  Small area of blood breakdown products central pons probably related to episode of hemorrhagic ischemia otherwise no evidence of intracranial hemorrhage.  Significant intracranial atherosclerotic type changes as detailed above.   Electronically Signed   By: Bridgett Larsson M.D.   On: 08/14/2013 18:04   Dg Chest Port 1  View  08/15/2013   CLINICAL DATA:  Respiratory failure.  EXAM: PORTABLE CHEST - 1 VIEW  COMPARISON:  08/14/2013 and 08/13/2013, and 11/03/2011  FINDINGS: There is chronic cardiomegaly. Pulmonary vascularity is slightly more prominent than on the prior study. Double-lumen central venous catheter is unchanged. No effusions. No acute osseous abnormality.  IMPRESSION: Slight increased pulmonary vascularity since the prior study. Chronic cardiomegaly.   Electronically Signed   By: Geanie Cooley M.D.   On: 08/15/2013 08:33   Dg Chest Port 1 View  08/14/2013   CLINICAL DATA:  Unresponsive, possible aspiration  EXAM: PORTABLE CHEST - 1 VIEW  COMPARISON:  Chest x-ray August 13, 2013  FINDINGS: The lungs are adequately inflated. There is no focal infiltrate nor evidence of atelectasis. There is no pleural effusion or pneumothorax. The cardiopericardial silhouette remains enlarged. The pulmonary vascularity is not engorged. The dual-lumen dialysis type catheter appears unchanged via the right internal jugular approach. The mediastinum is normal in width. The observed portions of the bony thorax appear normal.  IMPRESSION: There is no evidence of aspiration  or significant atelectasis. There is stable mild enlargement of the cardiac silhouette without evidence of pulmonary edema.   Electronically Signed   By: David  [place]   On: 08/14/2013 09:20   TELEMETRY: I reviewed telemetry today August 17, 2013. There is normal sinus rhythm with a rate of 65. There has been no significant bradycardia or tachycardia.   ASSESSMENT AND PLAN:    PULMONARY HYPERTENSION    There is moderately severe pulmonary hypertension. No further workup    MITRAL REGURGITATION      Mild mitral regurgitation by echo    Unresponsive episode        At this point there is been no proof of a cardiac basis for this problem. Recommendations are being made by neurology. No further cardiac workup.    CAD (coronary artery disease)       Coronary  disease is stable. No further workup    Aortic stenosis   Mild by echo  Cardiac status is stable. Cardiology will sign off.   Willa Rough 08/17/2013 9:30 AM

## 2013-08-17 NOTE — Progress Notes (Signed)
Stroke Team Progress Note  HISTORY Briana Gould is an 67 y.o. female who was at dialysis today 08/13/2013 when she felt as though her blood pressure was dropping (no documentation of what her BP was). During this episode she recalls people calling her name and feeling "out of it" but does not believe she fainted. She feels episode lasted for maybe 3 minutes and was fully alert after the episode. Similar episode occurred once when she had just started dialysis and last Wednesday when she dropped her BP during dialysis. When this occurred on Wednesday, she noted they stopped removing fluid and she returned to her baseline. She denies prior seizure history herself or in her family. Currently she is back to her baseline.   Patient was not considered for TPA candidate as she was nonfocal on admission. She was admitted  for further evaluation and treatment.  SUBJECTIVE Family at bedside.   OBJECTIVE Most recent Vital Signs: Filed Vitals:   08/17/13 0431 08/17/13 0500 08/17/13 0600 08/17/13 0905  BP:  173/54 167/49   Pulse:  63 62   Temp: 99.5 F (37.5 C)   98.7 F (37.1 C)  TempSrc: Oral   Oral  Resp:  19 19   Weight:      SpO2:  97% 96%    CBG (last 3)   Recent Labs  08/16/13 1607 08/16/13 2254 08/17/13 0756  GLUCAP 78 123* 134*    IV Fluid Intake:   . norepinephrine (LEVOPHED) Adult infusion      MEDICATIONS  . amLODipine  10 mg Oral Daily  . aspirin  81 mg Oral Daily  . clopidogrel  75 mg Oral Q breakfast  . doxazosin  8 mg Oral QHS  . enoxaparin (LOVENOX) injection  30 mg Subcutaneous Q24H  . feeding supplement (NEPRO CARB STEADY)  237 mL Oral Q24H  . hydrALAZINE  50 mg Oral TID  . insulin aspart  0-15 Units Subcutaneous TID WC  . insulin aspart  0-5 Units Subcutaneous QHS  . lisinopril  40 mg Oral Daily  . multivitamin  1 tablet Oral QHS  . sevelamer carbonate  2,400 mg Oral TID WC  . sodium chloride  3 mL Intravenous Q12H  . sodium chloride  3 mL Intravenous  Q12H   PRN:  sodium chloride, acetaminophen, albuterol, aluminum hydroxide, LORazepam, metoCLOPramide (REGLAN) injection, ondansetron (ZOFRAN) IV, sodium chloride  Diet:  Renal thin liquids with FR Activity:  Up with assistance DVT Prophylaxis:  None ordered  CLINICALLY SIGNIFICANT STUDIES Basic Metabolic Panel:  Recent Labs Lab 08/14/13 0735  08/15/13 0245 08/16/13 0545  NA 137  < > 136 133*  K 4.9  < > 5.2* 6.4*  CL 95*  < > 94* 92*  CO2 26  < > 25 21  GLUCOSE 123*  < > 104* 154*  BUN 72*  < > 74* 88*  CREATININE 9.51*  < > 10.27* 12.09*  CALCIUM 9.3  < > 9.3 9.7  PHOS 6.3*  --   --  7.9*  < > = values in this interval not displayed. Liver Function Tests:  Recent Labs Lab 08/13/13 0855  08/15/13 0245 08/16/13 0545  AST 23  --  19  --   ALT 16  --  16  --   ALKPHOS 85  --  77  --   BILITOT 0.4  --  0.3  --   PROT 7.4  --  6.5  --   ALBUMIN 3.8  < > 3.2* 3.5  < > =  values in this interval not displayed. CBC:  Recent Labs Lab 08/13/13 0855  08/16/13 0545 08/17/13 0355  WBC 8.2  < > 6.5 7.2  NEUTROABS 6.2  --   --   --   HGB 11.0*  < > 11.2* 10.8*  HCT 33.6*  < > 33.4* 32.3*  MCV 98.0  < > 95.7 95.6  PLT 89*  < > 100* 121*  < > = values in this interval not displayed. Coagulation: No results found for this basename: LABPROT, INR,  in the last 168 hours Cardiac Enzymes:   Recent Labs Lab 08/14/13 1005 08/14/13 1325 08/14/13 2254  TROPONINI <0.30 <0.30 <0.30   Urinalysis: No results found for this basename: COLORURINE, APPERANCEUR, LABSPEC, PHURINE, GLUCOSEU, HGBUR, BILIRUBINUR, KETONESUR, PROTEINUR, UROBILINOGEN, NITRITE, LEUKOCYTESUR,  in the last 168 hours Lipid Panel    Component Value Date/Time   CHOL  Value: 170        ATP III CLASSIFICATION:  <200     mg/dL   Desirable  161-096  mg/dL   Borderline High  >=045    mg/dL   High        40/98/1191 1928   TRIG 71 08/29/2010 1928   HDL 56 08/29/2010 1928   CHOLHDL 3.0 08/29/2010 1928   VLDL 14  08/29/2010 1928   LDLCALC  Value: 100        Total Cholesterol/HDL:CHD Risk Coronary Heart Disease Risk Table                     Men   Women  1/2 Average Risk   3.4   3.3  Average Risk       5.0   4.4  2 X Average Risk   9.6   7.1  3 X Average Risk  23.4   11.0        Use the calculated Patient Ratio above and the CHD Risk Table to determine the patient's CHD Risk.        ATP III CLASSIFICATION (LDL):  <100     mg/dL   Optimal  478-295  mg/dL   Near or Above                    Optimal  130-159  mg/dL   Borderline  621-308  mg/dL   High  >657     mg/dL   Very High* 84/69/6295 1928   HgbA1C  Lab Results  Component Value Date   HGBA1C 6.9* 11/04/2011    Urine Drug Screen:   No results found for this basename: labopia,  cocainscrnur,  labbenz,  amphetmu,  thcu,  labbarb    Alcohol Level: No results found for this basename: ETH,  in the last 168 hours    CT of the brain  08/13/2013 Mild diffuse cortical atrophy. No acute intracranial abnormality seen.  MRI of the brain  08/14/2013   Tiny area of altered signal intensity left motor strip may represent T2 shine through rather than acute infarct however if the patient has symptoms referable to the left motor strip than a tiny infarct at this level would need to be considered.  Remote right paracentral pontine infarct. Moderate small vessel disease type changes.    MRA of the brain  08/14/2013   Small area of blood breakdown products central pons probably related to episode of hemorrhagic ischemia otherwise no evidence of intracranial hemorrhage.  Significant intracranial atherosclerotic type changes.  2D Echocardiogram  Moderate left ventricular hypertrophy.  No obvious source of embolus  Carotid Doppler  Evidence of 60-79% right ICA stenosis, borderline >80%. Severe calcific plaque noted at origin of right ICA. 1-39% left ICA stenosis.  Bilateral vertebral arteries demonstrate antegrade flow, however the left is stenosed.  Incidental finding = deep  thrombosis of right IJV  CXR  08/15/2013    Slight increased pulmonary vascularity since the prior study. Chronic cardiomegaly.    EKG  LBBB, left axis deviation, undetermined rhythm.   EEG no seizure activity  Therapy Recommendations   Physical Exam   Mental Status:  Alert, awake, oriented, thought content appropriate. Speech fluent without evidence of aphasia. Able to follow 3 step commands without difficulty.  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  Deep Tendon Reflexes:  Right: Upper Extremity Left: Upper extremity  biceps (C-5 to C-6) 2/4 biceps (C-5 to C-6) 2/4  tricep (C7) 2/4 triceps (C7) 2/4  Brachioradialis (C6) 2/4 Brachioradialis (C6) 2/4  Lower Extremity Lower Extremity  quadriceps (L-2 to L-4) 2/4 quadriceps (L-2 to L-4) 2/4  Achilles (S1) 2/4 Achilles (S1) BKA  Plantars:  Right: downgoing Left: BKA  Cerebellar:  normal finger-to-nose,  Gait: not tested.   ASSESSMENT Ms. Briana Gould is a 67 y.o. female presenting with syncope during HD.  Imaging negative for acute. MRA confirms prominent diffuse intracranial arterial disease with cervical stenosis proximal R M1, L ICA precavernous stenosis and high-grade R vertebral artery stenosis. Given multiple stenosis, patient is not a  surgical/invervention candidate and is at high risk for stroke. Patient was on no antithrombotics prior to admission. Now on no antithrombotics for secondary stroke prevention.    R ICA 60-79% ICA stenosis, borderline >80%.   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 Discontinue plavix. Once anticoagulation course is complete, one antiplatelet will be sufficient.  Avoid hypotension during hemodialysis  Avoid excessive fluid removal during hemodialysis  Agree with plans to hold antihypertensives prior to dialysis No further stroke workup indicated. Ongoing risk factor control by Primary Care Physician Stroke Service will sign off. Please call should any needs arise.  Follow up with Dr. [name], Stroke Clinic, in 2 months.   Annie Main, MSN, RN, ANVP-BC, ANP-BC, [facility] Pager: [phone] 08/17/2013 9:50 AM  I have personally obtained a history, examined the patient, evaluated imaging results, and formulated the assessment and plan of care. I agree with the above.  Delia Heady, MD

## 2013-08-17 NOTE — Progress Notes (Signed)
ANTICOAGULATION CONSULT NOTE - Initial Consult  Pharmacy Consult for heparin + coumadin Indication: R IVJ DVT  Allergies  Allergen Reactions  . Phenergan [Promethazine Hcl] Other (See Comments)    Causes patient to" vomit more"  . Vitamin D Analogs Rash    Patient Measurements: Weight: 187 lb 9.8 oz (85.1 kg) Heparin Dosing Weight: 78kg  Vital Signs: Temp: 98.7 F (37.1 C) (12/09 0905) Temp src: Oral (12/09 0905) BP: 167/49 mmHg (12/09 0600) Pulse Rate: 62 (12/09 0600)  Labs:  Recent Labs  08/14/13 1005 08/14/13 1325 08/14/13 2254  08/15/13 0245 08/15/13 2020 08/16/13 0545 08/17/13 0355  HGB  --   --   --   < > 10.4*  --  11.2* 10.8*  HCT  --   --   --   --  30.9*  --  33.4* 32.3*  PLT  --   --   --   --  83*  --  100* 121*  HEPARINUNFRC  --   --   --   --   --  0.24* 0.32  --   CREATININE 9.08*  --   --   --  10.27*  --  12.09*  --   TROPONINI <0.30 <0.30 <0.30  --   --   --   --   --   < > = values in this interval not displayed.  The CrCl is unknown because both a height and weight (above a minimum accepted value) are required for this calculation.   Medical History: Past Medical History  Diagnosis Date  . Hypertension   . PAD (peripheral artery disease)   . Type II diabetes mellitus   . Anemia   . History of blood transfusion 2002    "related to amputation" (08/13/2013)  . End stage renal disease on dialysis     "[facility]; M, W, F" (08/13/2013)    Medications:  Infusions:  . heparin    . norepinephrine (LEVOPHED) Adult infusion      Assessment: 67 yof to resume heparin IV and start warfarin for R IJV DVT. Imagine negative for acute L frontal infarct. MRA confirms prominent intracranial arterial disease w/ cerical stenosis, L ICA precavernous stenosis and high grade R vertebral artery stenosis. Therefore pt is high risk for stroke per neurology.   Pts CBC demonstrates mild anemia and thrombocytopenia. Pt did receive a dose of  prophylactic lovenox yesterday afternoon. Dual anti-platelets have been discontinued now that coumadin is starting.   Goal of Therapy:  INR 2-3 Heparin level 0.3-0.5 units/ml Monitor platelets by anticoagulation protocol: Yes   Plan:  1. Resume heparin gtt at previously therapeutic rate of 1050units/hr without bolus 2. Check an 8 hour heparin level 3. Daily heparin level and CBC 4. Coumadin 5mg  PO x 1 tonight 5. Daily INR 6. Coumadin book and video to patient  Jamontae Thwaites, Drake Leach 08/17/2013,10:03 AM

## 2013-08-17 NOTE — Progress Notes (Signed)
[name], female   DOB: 1946-02-03, 67 y.o.   MRN: [id number]  [facility] Progress Note   Assessment/ Plan:   1. S/P cardiac arrest- 15 min into HD Saturday. Also last week had 3 episodes of presyncope +/- hypotension +/- chest pain at 2 sessions of outpatient dialysis. MRA significant for diffuse disease, including the L > R supraclinoid carotids, (severe tandem stenoses left side, focal stenosis on R), and loss of anterior comm artery as well. No obvious targets for surgical intervention noted at this time. She also has pulmonary HTN and mild AS with diffuse CAD and no targets for intervention.  2.ESRD: Tolerated conventional dialysis well yesterday with parameters to tolerate higher BP and back off on UF if SBP<110. Await labs from today- if not critical, plan for dialysis tomorrow. Nausea persists (initially suspected uremic).  3. Anemia: Hgb at goal for ESRD (off ESA)  4. CKD-MBD: High phosphorus- will increase binder dose  5. Nutrition: Low albumin, will start ONS today  6. Hypertension: BP elevated but tolerated due to problems with cerebral perfusion  Subjective:   Reports nausea and upper back pain. HD note reviewed.   Objective:   BP 167/49  Pulse 62  Temp(Src) 99.5 F (37.5 C) (Oral)  Resp 19  Wt 85.1 kg (187 lb 9.8 oz)  SpO2 96%  Physical Exam: WUJ:[id number] resting in bed AOZ:HYQMV RRR, normal S1 and S2 Resp:CTA bilaterally, no rales/wheeze HQI:ONGE, flat, NT, BS normal XBM:WUXLK right ankle edema. Left leg s/p BKA.  Labs: BMET  Recent Labs Lab 08/13/13 0855 08/13/13 1600 08/14/13 0416 08/14/13 0735 08/14/13 1005 08/15/13 0245 08/16/13 0545  NA 136  --  137 137 134* 136 133*  K 4.3  --  5.3* 4.9 5.0 5.2* 6.4*  CL 93*  --  96 95* 94* 94* 92*  CO2 26  --  26 26 25 25 21   GLUCOSE 176*  --  133* 123* 132* 104* 154*  BUN 55*  --  72* 72* 66* 74* 88*  CREATININE 8.32* 8.69* 9.37* 9.51* 9.08* 10.27* 12.09*  CALCIUM 10.2   --  9.5 9.3 9.6 9.3 9.7  PHOS  --   --   --  6.3*  --   --  7.9*   CBC  Recent Labs Lab 08/13/13 0855  08/14/13 0416 08/15/13 0245 08/16/13 0545 08/17/13 0355  WBC 8.2  < > 6.9 6.2 6.5 7.2  NEUTROABS 6.2  --   --   --   --   --   HGB 11.0*  < > 10.6* 10.4* 11.2* 10.8*  HCT 33.6*  < > 32.2* 30.9* 33.4* 32.3*  MCV 98.0  < > 98.2 96.9 95.7 95.6  PLT 89*  < > 95* 83* 100* 121*  < > = values in this interval not displayed.  Medications:    . amLODipine  10 mg Oral Daily  . aspirin  81 mg Oral Daily  . clopidogrel  75 mg Oral Q breakfast  . doxazosin  8 mg Oral QHS  . enoxaparin (LOVENOX) injection  30 mg Subcutaneous Q24H  . hydrALAZINE  50 mg Oral TID  . insulin aspart  0-15 Units Subcutaneous TID WC  . insulin aspart  0-5 Units Subcutaneous QHS  . lisinopril  40 mg Oral Daily  . multivitamin  1 tablet Oral QHS  . sevelamer carbonate  800 mg Oral TID WC  . sodium chloride  3 mL Intravenous Q12H  . sodium chloride  3 mL  Intravenous Q12H   Zetta Bills, MD 08/17/2013, 8:03 AM

## 2013-08-17 NOTE — Care Management Note (Signed)
    Page 1 of 1   08/17/2013     2:19:59 PM   CARE MANAGEMENT NOTE 08/17/2013  Patient:  Briana Gould, Briana Gould   Account Number:  [id number]  Date Initiated:  08/17/2013  Documentation initiated by:  [facility]  Subjective/Objective Assessment:   Cardiac arrest - during HD. Not requiring intubation.     Action/Plan:   Anticipated DC Date:  08/20/2013   Anticipated DC Plan:  HOME W HOME HEALTH SERVICES      DC Planning Services  CM consult      Choice offered to / List presented to:             Status of service:  In process, will continue to follow Medicare Important Message given?   (If response is "NO", the following Medicare IM given date fields will be blank) Date Medicare IM given:   Date Additional Medicare IM given:    Discharge Disposition:    Per UR Regulation:  Reviewed for med. necessity/level of care/duration of stay  If discussed at Long Length of Stay Meetings, dates discussed:    Comments:  Contact:  Smith,Vicky Sister [phone]   [phone]                 Elizabeth Sauer     [phone]  08-17-13 11:30am Avie Arenas, RNBSN [phone] Talked with patient and daughter in room.  Patient lives at home with husband and grandchildren - the oldest is 2. patient drives self to dialysis.  Support from other family members also available.  patient states has no needs at home at this time.  CM will continue to follow.

## 2013-08-17 NOTE — Progress Notes (Signed)
ANTICOAGULATION CONSULT NOTE- HEPARIN  Indication: deep thrombosis of right IJV  Heparin level = 0.18 on 1050 units/hr Goal heparin level = 0.3-0.5  The heparin level is subtherapeutic. Will increase the heparin rate to 1250 units/hr and f/u the AM heparin level.  Cardell Peach, PharmD

## 2013-08-17 NOTE — Progress Notes (Signed)
Pt arrived to unit accompanied by RN and NT. Pt is A&Ox4, in NAD. Pt oriented to unit, call bell, and staff. Will continue to monitor.

## 2013-08-18 LAB — RENAL FUNCTION PANEL
BUN: 56 mg/dL — ABNORMAL HIGH (ref 6–23)
CO2: 25 mEq/L (ref 19–32)
Calcium: 9.6 mg/dL (ref 8.4–10.5)
Chloride: 91 mEq/L — ABNORMAL LOW (ref 96–112)
Creatinine, Ser: 8.66 mg/dL — ABNORMAL HIGH (ref 0.50–1.10)
GFR calc Af Amer: 5 mL/min — ABNORMAL LOW (ref >90)
Glucose, Bld: 129 mg/dL — ABNORMAL HIGH (ref 70–99)
Phosphorus: 7.3 mg/dL — ABNORMAL HIGH (ref 2.3–4.6)
Sodium: 135 mEq/L (ref 135–145)

## 2013-08-18 LAB — CBC
Hemoglobin: 10.4 g/dL — ABNORMAL LOW (ref 12.0–15.0)
MCH: 32.1 pg (ref 26.0–34.0)
MCHC: 33.9 g/dL (ref 30.0–36.0)
RBC: 3.24 MIL/uL — ABNORMAL LOW (ref 3.87–5.11)

## 2013-08-18 LAB — PROTIME-INR: INR: 1.05 (ref 0.00–1.49)

## 2013-08-18 LAB — GLUCOSE, CAPILLARY
Glucose-Capillary: 130 mg/dL — ABNORMAL HIGH (ref 70–99)
Glucose-Capillary: 119 mg/dL — ABNORMAL HIGH (ref 70–99)

## 2013-08-18 MED ORDER — HYDRALAZINE HCL 20 MG/ML IJ SOLN: 5.0000 mg | Freq: Three times a day (TID) | INTRAMUSCULAR | Status: DC | PRN

## 2013-08-18 MED ORDER — WHITE PETROLATUM GEL
Status: AC
  Filled 2013-08-18: qty 5

## 2013-08-18 MED ORDER — HEPARIN SODIUM (PORCINE) 1000 UNIT/ML DIALYSIS
4000.0000 [IU] | INTRAMUSCULAR | Status: DC | PRN
  Filled 2013-08-18: qty 4

## 2013-08-18 MED ORDER — WARFARIN SODIUM 5 MG PO TABS
5.0000 mg | ORAL_TABLET | Freq: Once | ORAL | Status: AC
  Administered 2013-08-18: 5 mg via ORAL
  Filled 2013-08-18: qty 1

## 2013-08-18 NOTE — Plan of Care (Signed)
Problem: Phase I Progression Outcomes Goal: OOB as tolerated unless otherwise ordered Outcome: Completed/Met Date Met:  08/18/13 Pt oob with assist to bedside commode

## 2013-08-18 NOTE — Progress Notes (Signed)
[name], female   DOB: [birth year], 67 y.o.   MRN: [id number]   [facility] Progress Note   Assessment/ Plan:   1. S/P cardiac arrest- 15 min into HD Saturday witnessed here in the hospital-also preceding 2 treatments were marked with presyncopal/syncopal events-difficult to delineate as to whether she had dysrhythmias as they were at her outpatient dialysis Center. MRI/MRA show significant intracerebral vascular disease without obvious targets for intervention. The plan is for continued medical management. 2. End-stage renal disease on hemodialysis: Plan for hemodialysis today in (usually M./W./F). The parameter is to discontinue ultrafiltration and systolic blood pressure drops less than 150 and to allow for high blood pressures in order to preserve cerebral perfusion. This had worked on a trial of dialysis done on Monday. She has improvement of her nausea-initially this was suspected to be uremic symptom. 3. Anemia: Hgb at goal for ESRD (off ESA)  4. CKD-MBD: High phosphorus- binder dosing increased yesterday-inconsistent intake secondary to nausea/poor oral intake  5. Nutrition: Low albumin, started on ONS-oral intake remains suboptimal due to continued nausea. 6. Hypertension: BP elevated but tolerated due to problems with cerebral perfusion  Subjective:   Reports improvement of her nausea-denies any chest pain or shortness of breath    Objective:   BP 182/79  Pulse 64  Temp(Src) 99.2 F (37.3 C) (Oral)  Resp 18  Ht 5\' 6"  (1.676 m)  Wt 85.1 kg (187 lb 9.8 oz)  SpO2 98%  Physical Exam: Gen: Comfortably sleeping in bed-easy to awaken and engages in conversation CVS: Pulse regular in rate and rhythm, S1 and S2 normal Resp: Clear to auscultation bilaterally-no rales/rhonchi Abd: Soft, obese, mildly tender over the epigastric area Ext: Status post left below knee amputation, no appreciable edema right lower extremity  Labs: BMET  Recent  Labs Lab 08/14/13 0416 08/14/13 0735 08/14/13 1005 08/15/13 0245 08/16/13 0545 08/17/13 0953 08/18/13 0700  NA 137 137 134* 136 133* 137 135  K 5.3* 4.9 5.0 5.2* 6.4* 4.7 4.7  CL 96 95* 94* 94* 92* 92* 91*  CO2 26 26 25 25 21 25 25   GLUCOSE 133* 123* 132* 104* 154* 132* 129*  BUN 72* 72* 66* 74* 88* 41* 56*  CREATININE 9.37* 9.51* 9.08* 10.27* 12.09* 6.98* 8.66*  CALCIUM 9.5 9.3 9.6 9.3 9.7 10.0 9.6  PHOS  --  6.3*  --   --  7.9* 7.1* 7.3*   CBC  Recent Labs Lab 08/13/13 0855  08/15/13 0245 08/16/13 0545 08/17/13 0355 08/18/13 0500  WBC 8.2  < > 6.2 6.5 7.2 6.5  NEUTROABS 6.2  --   --   --   --   --   HGB 11.0*  < > 10.4* 11.2* 10.8* 10.4*  HCT 33.6*  < > 30.9* 33.4* 32.3* 30.7*  MCV 98.0  < > 96.9 95.7 95.6 94.8  PLT 89*  < > 83* 100* 121* 125*  < > = values in this interval not displayed.  Medications:    . amLODipine  10 mg Oral Daily  . doxazosin  8 mg Oral QHS  . feeding supplement (NEPRO CARB STEADY)  237 mL Oral Q24H  . hydrALAZINE  50 mg Oral TID  . insulin aspart  0-15 Units Subcutaneous TID WC  . insulin aspart  0-5 Units Subcutaneous QHS  . lisinopril  40 mg Oral Daily  . multivitamin  1 tablet Oral QHS  . sevelamer carbonate  2,400 mg Oral TID WC  . sodium chloride  3 mL Intravenous Q12H  . sodium chloride  3 mL Intravenous Q12H  . Warfarin - Pharmacist Dosing Inpatient   Does not apply q1800  . white petrolatum       Zetta Bills, MD 08/18/2013, 10:39 AM

## 2013-08-18 NOTE — Progress Notes (Signed)
[facility] PROGRESS NOTE  Terin Swanston-Larcom ZOX:[id number] DOB: Feb 01, 1946 DOA: 08/13/2013 PCP: [name], MD  BRIEF HPI: 67 yo with ESRD on HD admitted 12/05 for hypotension during HD. Transferred to ICU 12/06 after recurrent hypotension during HD with possible cardiac arrest requiring brief CPR. Not intubated and cognitively fully intact after episode.  Assessment/Plan: 1. Right IJ vein thrombosis: - on IV heparin and coumadin. - subtherapeutic INR. - neurology recommended 81 mg of aspirin.   2. ESRD ON hd: - management as per renal.   3. Positive Blood cultures: contaminant. Monitoring off antibiotics.   4. Diabetes Mellitus: -  CBG (last 3)   Recent Labs  08/17/13 2120 08/18/13 0806 08/18/13 1133  GLUCAP 118* 130* 119*    SSI.   5. RECURRENT LOC during HD - Further management as per neurology.   Code Status: full code Family Communication: discussed with the family atbedside. Disposition Plan: pending.    Consultants:  PCCM   neurology  Procedures: 12/05 CT head: NAD  12/06 EEG: no seizure activity  12/06 MRI brain: Possible but unlikely tiny L motor strip infarct, remote R pontine infarct, old blood central pons  12/06 MRA brain: multiple bilateral moderate and high grade intracranial stenoses  12/07 Echo: moderate LVH, grade 2 diastolic dysfunction. RVSP est 45 mmHg  12/07 Carotid Dopplers: Evidence of 60-79% right ICA stenosis, borderline >80%. Severe calcific plaque noted at origin of right ICA. 1-39% left ICA stenosis. Bilateral vertebral arteries demonstrate antegrade flow, however the left is stenosed. Incidental finding of deep thrombosis of R IJ vein  12/08 Tolerated HD without hypotension  12/09 Transfer to med-surg. TRH to resume primary care duties 12/10   Antibiotics:  None.  HPI/Subjective: Chest pain from compressions, controlled with pain meds.   Objective: Filed Vitals:   08/18/13 1415  BP: 188/86  Pulse: 67   Temp: 98.7 F (37.1 C)  Resp: 16    Intake/Output Summary (Last 24 hours) at 08/18/13 1452 Last data filed at 08/18/13 1332  Gross per 24 hour  Intake    240 ml  Output      0 ml  Net    240 ml   Filed Weights   08/15/13 1500 08/16/13 1100 08/18/13 1415  Weight: 87.6 kg (193 lb 2 oz) 85.1 kg (187 lb 9.8 oz) 87.2 kg (192 lb 3.9 oz)    Exam:   General:  Alert afebrile comfortable  Cardiovascular: s1s2  Respiratory: ctab  Abdomen: soft TN ND BS+  Musculoskeletal: no pedal edema.   Data Reviewed: Basic Metabolic Panel:  Recent Labs Lab 08/14/13 0735 08/14/13 1005 08/15/13 0245 08/16/13 0545 08/17/13 0953 08/18/13 0700  NA 137 134* 136 133* 137 135  K 4.9 5.0 5.2* 6.4* 4.7 4.7  CL 95* 94* 94* 92* 92* 91*  CO2 26 25 25 21 25 25   GLUCOSE 123* 132* 104* 154* 132* 129*  BUN 72* 66* 74* 88* 41* 56*  CREATININE 9.51* 9.08* 10.27* 12.09* 6.98* 8.66*  CALCIUM 9.3 9.6 9.3 9.7 10.0 9.6  PHOS 6.3*  --   --  7.9* 7.1* 7.3*   Liver Function Tests:  Recent Labs Lab 08/13/13 0855 08/14/13 0735 08/15/13 0245 08/16/13 0545 08/17/13 0953 08/18/13 0700  AST 23  --  19  --   --   --   ALT 16  --  16  --   --   --   ALKPHOS 85  --  77  --   --   --  BILITOT 0.4  --  0.3  --   --   --   PROT 7.4  --  6.5  --   --   --   ALBUMIN 3.8 3.3* 3.2* 3.5 3.4* 3.3*   No results found for this basename: LIPASE, AMYLASE,  in the last 168 hours No results found for this basename: AMMONIA,  in the last 168 hours CBC:  Recent Labs Lab 08/13/13 0855  08/14/13 0416 08/15/13 0245 08/16/13 0545 08/17/13 0355 08/18/13 0500  WBC 8.2  < > 6.9 6.2 6.5 7.2 6.5  NEUTROABS 6.2  --   --   --   --   --   --   HGB 11.0*  < > 10.6* 10.4* 11.2* 10.8* 10.4*  HCT 33.6*  < > 32.2* 30.9* 33.4* 32.3* 30.7*  MCV 98.0  < > 98.2 96.9 95.7 95.6 94.8  PLT 89*  < > 95* 83* 100* 121* 125*  < > = values in this interval not displayed. Cardiac Enzymes:  Recent Labs Lab 08/13/13 2125  08/14/13 0446 08/14/13 1005 08/14/13 1325 08/14/13 2254  TROPONINI <0.30 <0.30 <0.30 <0.30 <0.30   BNP (last 3 results) No results found for this basename: PROBNP,  in the last 8760 hours CBG:  Recent Labs Lab 08/17/13 1218 08/17/13 1603 08/17/13 2120 08/18/13 0806 08/18/13 1133  GLUCAP 174* 124* 118* 130* 119*    Recent Results (from the past 240 hour(s))  MRSA PCR SCREENING     Status: None   Collection Time    08/13/13  8:41 PM      Result Value Range Status   MRSA by PCR NEGATIVE  NEGATIVE Final   Comment:            The GeneXpert MRSA Assay (FDA     approved for NASAL specimens     only), is one component of a     comprehensive MRSA colonization     surveillance program. It is not     intended to diagnose MRSA     infection nor to guide or     monitor treatment for     MRSA infections.  CULTURE, BLOOD (ROUTINE X 2)     Status: None   Collection Time    08/14/13  9:45 AM      Result Value Range Status   Specimen Description BLOOD RIGHT HAND   Final   Special Requests BOTTLES DRAWN AEROBIC ONLY 10CC   Final   Culture  Setup Time     Final   Value: 08/14/2013 17:44     Performed at [facility]   Culture     Final   Value:        BLOOD CULTURE RECEIVED NO GROWTH TO DATE CULTURE WILL BE HELD FOR 5 DAYS BEFORE ISSUING A FINAL NEGATIVE REPORT     Performed at [facility]   Report Status PENDING   Incomplete  CULTURE, BLOOD (ROUTINE X 2)     Status: None   Collection Time    08/14/13 10:05 AM      Result Value Range Status   Specimen Description BLOOD RIGHT ARM   Final   Special Requests BOTTLES DRAWN AEROBIC ONLY 10CC   Final   Culture  Setup Time     Final   Value: 08/14/2013 17:45     Performed at [facility]   Culture     Final   Value:        BLOOD  CULTURE RECEIVED NO GROWTH TO DATE CULTURE WILL BE HELD FOR 5 DAYS BEFORE ISSUING A FINAL NEGATIVE REPORT     Performed at [facility]   Report Status PENDING   Incomplete   CULTURE, BLOOD (SINGLE)     Status: None   Collection Time    08/14/13 10:45 AM      Result Value Range Status   Specimen Description BLOOD RIGHT HEMODIALYSIS CATHETER   Final   Special Requests     Final   Value: BOTTLES DRAWN AEROBIC AND ANAEROBIC 5CC FROM [facility]UE PORT OF [facility]   Culture  Setup Time     Final   Value: 08/14/2013 17:45     Performed at [facility]   Culture     Final   Value: DIPHTHEROIDS(CORYNEBACTERIUM SPECIES)     Note: Standardized susceptibility testing for this organism is not available.     Note: Gram Stain Report Called to,Read Back By and Verified With: Raelyn Number RN on 08/16/13 at 06:08 by Christie Nottingham     Performed at [facility]   Report Status 08/17/2013 FINAL   Final     Studies: No results found.  Scheduled Meds: . amLODipine  10 mg Oral Daily  . doxazosin  8 mg Oral QHS  . feeding supplement (NEPRO CARB STEADY)  237 mL Oral Q24H  . hydrALAZINE  50 mg Oral TID  . insulin aspart  0-15 Units Subcutaneous TID WC  . insulin aspart  0-5 Units Subcutaneous QHS  . lisinopril  40 mg Oral Daily  . multivitamin  1 tablet Oral QHS  . sevelamer carbonate  2,400 mg Oral TID WC  . sodium chloride  3 mL Intravenous Q12H  . sodium chloride  3 mL Intravenous Q12H  . warfarin  5 mg Oral ONCE-1800  . Warfarin - Pharmacist Dosing Inpatient   Does not apply q1800   Continuous Infusions: . heparin 1,300 Units/hr (08/18/13 0949)  . norepinephrine (LEVOPHED) Adult infusion      Principal Problem:   Seizure Active Problems:   HYPERTENSION   PULMONARY HYPERTENSION   MITRAL REGURGITATION   ESRD (end stage renal disease) on dialysis   PAD (peripheral artery disease)   Unresponsive episode   Hypotension   Thrombocytopenia   R Jugular vein thrombosis   Cerebrovascular disease   CAD (coronary artery disease)   History of syncope   Aortic stenosis    Time spent: 30 min    Briana Gould  [facility] Pager [phone]. If 7PM-7AM,  please contact night-coverage at [email or web address], password [facility] 08/18/2013, 2:52 PM  LOS: 5 days

## 2013-08-18 NOTE — Progress Notes (Signed)
ANTICOAGULATION CONSULT NOTE - Follow Up Consult  Pharmacy Consult for Heparin and Coumadin Indication: right IJ vein DVT  Allergies  Allergen Reactions  . Phenergan [Promethazine Hcl] Other (See Comments)    Causes patient to" vomit more"  . Vitamin D Analogs Rash    Patient Measurements: Height: 5\' 6"  (167.6 cm) Weight: 187 lb 9.8 oz (85.1 kg) IBW/kg (Calculated) : 59.3 Heparin Dosing Weight: 78 kg  Vital Signs: Temp: 99.2 F (37.3 C) (12/10 0809) Temp src: Oral (12/10 0809) BP: 182/79 mmHg (12/10 0809) Pulse Rate: 64 (12/10 0809)  Labs:  Recent Labs  08/16/13 0545 08/17/13 0355 08/17/13 0953 08/17/13 1825 08/18/13 0500 08/18/13 0700  HGB 11.2* 10.8*  --   --  10.4*  --   HCT 33.4* 32.3*  --   --  30.7*  --   PLT 100* 121*  --   --  125*  --   LABPROT  --   --   --   --  13.5  --   INR  --   --   --   --  1.05  --   HEPARINUNFRC 0.32  --   --  0.18* <0.10*  --   CREATININE 12.09*  --  6.98*  --   --  8.66*    Estimated Creatinine Clearance: 6.9 ml/min (by C-G formula based on Cr of 8.66).  Assessment:   Day #2 overlap of heparin and Coumadin. Right IJ vein clot, multiple areas of stenosis; high stroke risk noted.    Heparin level undetectable this morning.  Infusing at 1050 units/hr.  Rate increased intended last night when heparin level was 0.18, but order not entered.   INR 1.05 after Coumadin 5 mg x 1 on 12/9.   Platelet count is low, but some trend up. No bleeding noted.  Aspirin 81 mg and Plavix discontinued on 12/9.  For dialysis today.  Goal of Therapy:  INR 2-3 Heparin level 0.3-0.5 units/ml (lower end of therapeutic range) Monitor platelets by anticoagulation protocol: Yes   Plan:   Heparin drip increased to 1300 units/hr this morning.  Will repeat Coumadin 5 mg today.  Heparin level ~ 8 hours post-dialysis today.  Daily heparin level, CBC and PT/INR.  Dennie Fetters, RPh Pager: [phone] 08/18/2013,11:19 AM

## 2013-08-18 NOTE — Progress Notes (Signed)
Pt's BP at 8:09 was 182/79. Pt going to dialysis today, probably around lunch time. Consulted with Bard Herbert, Nephrology PA, who recommended holding antihypertensives prior to dialysis. Will continue to monitor.

## 2013-08-18 NOTE — Procedures (Signed)
   HEMODIALYSIS TREATMENT NOTE:  4 hour heparinized HD tx completed via right IJ tunneled catheter (exit site unremarkable). Goal met:  Tolerated removal of 1.5 liters with no interruption in ultrafiltration.  SBP 180s during HD.  All blood was reinfused.  Theone Bowell L. Rashon Westrup, RN, CDN

## 2013-08-19 ENCOUNTER — Inpatient Hospital Stay (HOSPITAL_COMMUNITY): Payer: Medicare Other

## 2013-08-19 LAB — HEPARIN LEVEL (UNFRACTIONATED): Heparin Unfractionated: 0.42 IU/mL (ref 0.30–0.70)

## 2013-08-19 LAB — CBC
MCHC: 33.4 g/dL (ref 30.0–36.0)
Platelets: 108 10*3/uL — ABNORMAL LOW (ref 150–400)
RBC: 3.27 MIL/uL — ABNORMAL LOW (ref 3.87–5.11)
RDW: 14.5 % (ref 11.5–15.5)

## 2013-08-19 LAB — GLUCOSE, CAPILLARY
Glucose-Capillary: 103 mg/dL — ABNORMAL HIGH (ref 70–99)
Glucose-Capillary: 164 mg/dL — ABNORMAL HIGH (ref 70–99)
Glucose-Capillary: 139 mg/dL — ABNORMAL HIGH (ref 70–99)
Glucose-Capillary: 198 mg/dL — ABNORMAL HIGH (ref 70–99)

## 2013-08-19 LAB — PROTIME-INR
INR: 1.23 (ref 0.00–1.49)
Prothrombin Time: 15.2 seconds (ref 11.6–15.2)

## 2013-08-19 MED ORDER — OXYCODONE HCL 5 MG PO TABS: 5.0000 mg | ORAL_TABLET | ORAL | Status: DC | PRN

## 2013-08-19 MED ORDER — WARFARIN SODIUM 5 MG PO TABS
5.0000 mg | ORAL_TABLET | Freq: Once | ORAL | Status: AC
  Administered 2013-08-19: 5 mg via ORAL
  Filled 2013-08-19: qty 1

## 2013-08-19 MED ORDER — KETOROLAC TROMETHAMINE 30 MG/ML IJ SOLN
15.0000 mg | Freq: Four times a day (QID) | INTRAMUSCULAR | Status: DC | PRN
  Administered 2013-08-19 – 2013-08-20 (×2): 15 mg via INTRAVENOUS
  Filled 2013-08-19 (×3): qty 1

## 2013-08-19 NOTE — Progress Notes (Signed)
OT Cancellation Note  Patient Details Name: Briana Gould MRN: [id number] DOB: October 18, 1945   Cancelled Treatment:    Reason Eval/Treat Not Completed: Patient politely declined, stating that she did not feel well, has hiccups and nausea. Requested that OT return in the morning. Will re attempt tomorrow  Galen Manila 08/19/2013, 3:40 PM

## 2013-08-19 NOTE — Progress Notes (Signed)
Heparin drip paused at 1032 for heparin level. Notified lab technician on unit. Per pt labs have not been drawn. Call placed to [phone] [address], states will send tech. Will continue to monitor.

## 2013-08-19 NOTE — Evaluation (Signed)
Physical Therapy Evaluation Patient Details Name: Miyu Swanston-Zwart MRN: [id number] DOB: 1946-05-16 Today's Date: 08/19/2013 Time: 4098-1191 PT Time Calculation (min): 25 min  PT Assessment / Plan / Recommendation History of Present Illness  Justene Mcdonald is a 67 y.o. 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 Apparently, today just 10-15 minutes into dialysis, patient felt lightheaded, and was noted to have generalized tightness and some "seizure like activity". Patient claims that she had urinary incontinence associated with this. There was no tongue biting. Apparently from the history obtained from the emergency department physician, apparently her blood pressure was low-during this episode. She also claims, to have vomited once during this episode. She is not sure whether she lost consciousness or not, but claims that she was not completely "with it" during this entire episode. There is no documentation of any post ictal state. There is no documentation as to exactly what the blood pressure readings were. She was then transferred to the emergency room, I was subsequently asked to admit this patient for further evaluation and treatment.  Clinical Impression  Pt presents with decreased strength, balance and mobility and will benefit from further PT services to address deficits and increase functional independence.  Pt/daughter educated on recommendation to use w/c at all times in house since pt will be alone during the day.  Pt agreeable to use RW vs cane for energy conservation with community mobility.    PT Assessment  Patient needs continued PT services    Follow Up Recommendations  Home health PT    Does the patient have the potential to tolerate intense rehabilitation      Barriers to Discharge         Equipment Recommendations  Rolling walker with 5" wheels    Recommendations for Other Services     Frequency Min 3X/week    Precautions / Restrictions Restrictions Weight Bearing Restrictions: No   Pertinent Vitals/Pain Pt c/o R shoulder pain, RN aware.      Mobility  Bed Mobility Bed Mobility: Supine to Sit;Sit to Supine Supine to Sit: 7: Independent Sit to Supine: 7: Independent Transfers Transfers: Sit to Stand;Stand to Sit Sit to Stand: 4: Min assist Stand to Sit: 4: Min assist Details for Transfer Assistance: assist for lifting due to LE weakness Ambulation/Gait Ambulation/Gait Assistance: 4: Min guard Ambulation Distance (Feet): 35 Feet Assistive device: Rolling walker Ambulation/Gait Assistance Details: pt with decreased step and stride length, decreased activity tolerance    Exercises General Exercises - Lower Extremity Long Arc Quad: AROM;Both;10 reps Hip Flexion/Marching: AROM;Both;10 reps   PT Diagnosis: Difficulty walking;Generalized weakness  PT Problem List: Decreased mobility;Decreased strength;Decreased activity tolerance;Decreased balance;Decreased knowledge of use of DME PT Treatment Interventions: DME instruction;Gait training;Functional mobility training;Therapeutic activities;Patient/family education;Neuromuscular re-education;Balance training;Therapeutic exercise;Wheelchair mobility training;Modalities     PT Goals(Current goals can be found in the care plan section) Acute Rehab PT Goals Patient Stated Goal: go home with my family PT Goal Formulation: With patient Time For Goal Achievement: 08/26/13 Potential to Achieve Goals: Good  Visit Information  Last PT Received On: 08/19/13 Assistance Needed: +1 History of Present Illness: Lindsea Gutzwiller is a 67 y.o. 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 Apparently, today just 10-15 minutes into dialysis,  patient felt lightheaded, and was noted to have generalized tightness and some "seizure like activity". Patient claims that she had urinary incontinence associated with this. There was no tongue biting. Apparently from the history obtained from the emergency department physician, apparently her blood pressure was low-during this episode. She also claims, to have vomited once during this episode. She is not sure whether she lost consciousness or not, but claims that she was not completely "with it" during this entire episode. There is no documentation of any post ictal state. There is no documentation as to exactly what the blood pressure readings were. She was then transferred to the emergency room, I was subsequently asked to admit this patient for further evaluation and treatment.       Prior Functioning  Home Living Family/patient expects to be discharged to:: Private residence Living Arrangements: Spouse/significant other;Children Available Help at Discharge: Family;Available PRN/intermittently Type of Home: House Home Access: Ramped entrance Home Layout: Able to live on main level with bedroom/bathroom Home Equipment: Cane - single point;Wheelchair - manual Prior Function Level of Independence: Independent with assistive device(s) Comments: uses w/c in house, SPC outside Communication Communication: No difficulties    Cognition  Cognition Arousal/Alertness: Awake/alert Behavior During Therapy: WFL for tasks assessed/performed Overall Cognitive Status: Within Functional Limits for tasks assessed    Extremity/Trunk Assessment Lower Extremity Assessment Lower Extremity Assessment: Generalized weakness Cervical / Trunk Assessment Cervical / Trunk Assessment: Normal   Balance Dynamic Sitting Balance Dynamic Sitting - Balance Support: During functional activity Dynamic Sitting - Level of Assistance:  7: Independent Dynamic Sitting - Comments: pt able to reach all directions in all planes without LOB Static Standing Balance Static Standing - Balance Support: During functional activity Static Standing - Level of Assistance: 5: Stand by assistance  End of Session PT - End of Session Equipment Utilized During Treatment: Gait belt Activity Tolerance: Patient tolerated treatment well Patient left: in bed;with call bell/phone within reach;with family/visitor present Nurse Communication: Mobility status  GP     Tobby Fawcett 08/19/2013, 10:27 AM

## 2013-08-19 NOTE — Progress Notes (Signed)
ANTICOAGULATION CONSULT NOTE - Follow Up Consult  Pharmacy Consult for Heparin and Coumadin Indication: right IJ vein DVT  Allergies  Allergen Reactions  . Phenergan [Promethazine Hcl] Other (See Comments)    Causes patient to" vomit more"  . Vitamin D Analogs Rash    Patient Measurements: Height: 5\' 6"  (167.6 cm) Weight: 192 lb 3.9 oz (87.2 kg) IBW/kg (Calculated) : 59.3 Heparin Dosing Weight: 78 kg  Vital Signs: Temp: 99.6 F (37.6 C) (12/11 0854) Temp src: Oral (12/11 0854) BP: 194/75 mmHg (12/11 0854) Pulse Rate: 64 (12/11 0854)  Labs:  Recent Labs  08/17/13 0355 08/17/13 0953 08/17/13 1825 08/18/13 0500 08/18/13 0700 08/19/13 0235  HGB 10.8*  --   --  10.4*  --  10.6*  HCT 32.3*  --   --  30.7*  --  31.7*  PLT 121*  --   --  125*  --  108*  LABPROT  --   --   --  13.5  --  15.2  INR  --   --   --  1.05  --  1.23  HEPARINUNFRC  --   --  0.18* <0.10*  --  0.42  CREATININE  --  6.98*  --   --  8.66*  --     Estimated Creatinine Clearance: 7 ml/min (by C-G formula based on Cr of 8.66).  Assessment:   Day #3 overlap of heparin and Coumadin. Right IJ vein clot, multiple areas of stenosis; high stroke risk noted.    INR 1.23 after 2 doses of coumadin   Heparin level therapeutic today  Goal of Therapy:  INR 2-3 Heparin level 0.3-0.5 units/ml (lower end of therapeutic range) Monitor platelets by anticoagulation protocol: Yes   Plan:   Heparin at 1300 units/hr  Coumadin 5 mg today.  F/u with heparin level  Daily heparin level, CBC and PT/INR.  Ulyses Southward, PharmD Pager: [phone] 08/19/2013 9:00 AM

## 2013-08-19 NOTE — Progress Notes (Signed)
[facility] PROGRESS NOTE  Briana Gould WUJ:[id number] DOB: 07-Jan-1946 DOA: 08/13/2013 PCP: Briana Slimmer, MD  BRIEF HPI: 67 yo with ESRD on HD admitted 12/05 for hypotension during HD. Transferred to ICU 12/06 after recurrent hypotension during HD with possible cardiac arrest requiring brief CPR. Not intubated and cognitively fully intact after episode.  Assessment/Plan: 1. Right IJ vein thrombosis: - on IV heparin and coumadin. - subtherapeutic INR. - neurology recommended 81 mg of aspirin.   2. ESRD ON hd: - management as per renal.   3. Positive Blood cultures: contaminant. Monitoring off antibiotics.   4. Diabetes Mellitus: -  CBG (last 3)   Recent Labs  08/19/13 0839 08/19/13 1133 08/19/13 1650  GLUCAP 103* 164* 139*    SSI.   5. RECURRENT LOC during HD - Further management as per neurology.    6. Soreness in the chest wall on the right side: Rib series neg for fracture. Pain control.   Code Status: full code Family Communication: discussed with the family atbedside. Disposition Plan: pending.    Consultants:  PCCM   neurology  Procedures: 12/05 CT head: NAD  12/06 EEG: no seizure activity  12/06 MRI brain: Possible but unlikely tiny L motor strip infarct, remote R pontine infarct, old blood central pons  12/06 MRA brain: multiple bilateral moderate and high grade intracranial stenoses  12/07 Echo: moderate LVH, grade 2 diastolic dysfunction. RVSP est 45 mmHg  12/07 Carotid Dopplers: Evidence of 60-79% right ICA stenosis, borderline >80%. Severe calcific plaque noted at origin of right ICA. 1-39% left ICA stenosis. Bilateral vertebral arteries demonstrate antegrade flow, however the left is stenosed. Incidental finding of deep thrombosis of R IJ vein  12/08 Tolerated HD without hypotension  12/09 Transfer to med-surg. TRH to resume primary care duties 12/10   Antibiotics:  None.  HPI/Subjective: Chest pain from compressions,  controlled with pain meds.   Objective: Filed Vitals:   08/19/13 1400  BP: 154/44  Pulse: 67  Temp: 98.9 F (37.2 C)  Resp: 18    Intake/Output Summary (Last 24 hours) at 08/19/13 1725 Last data filed at 08/19/13 0844  Gross per 24 hour  Intake 497.96 ml  Output   1500 ml  Net -1002.04 ml   Filed Weights   08/15/13 1500 08/16/13 1100 08/18/13 1415  Weight: 87.6 kg (193 lb 2 oz) 85.1 kg (187 lb 9.8 oz) 87.2 kg (192 lb 3.9 oz)    Exam:   General:  Alert afebrile comfortable  Cardiovascular: s1s2  Respiratory: ctab  Abdomen: soft TN ND BS+  Musculoskeletal: no pedal edema.   Data Reviewed: Basic Metabolic Panel:  Recent Labs Lab 08/14/13 0735 08/14/13 1005 08/15/13 0245 08/16/13 0545 08/17/13 0953 08/18/13 0700  NA 137 134* 136 133* 137 135  K 4.9 5.0 5.2* 6.4* 4.7 4.7  CL 95* 94* 94* 92* 92* 91*  CO2 26 25 25 21 25 25   GLUCOSE 123* 132* 104* 154* 132* 129*  BUN 72* 66* 74* 88* 41* 56*  CREATININE 9.51* 9.08* 10.27* 12.09* 6.98* 8.66*  CALCIUM 9.3 9.6 9.3 9.7 10.0 9.6  PHOS 6.3*  --   --  7.9* 7.1* 7.3*   Liver Function Tests:  Recent Labs Lab 08/13/13 0855 08/14/13 0735 08/15/13 0245 08/16/13 0545 08/17/13 0953 08/18/13 0700  AST 23  --  19  --   --   --   ALT 16  --  16  --   --   --   ALKPHOS 85  --  77  --   --   --   BILITOT 0.4  --  0.3  --   --   --   PROT 7.4  --  6.5  --   --   --   ALBUMIN 3.8 3.3* 3.2* 3.5 3.4* 3.3*   No results found for this basename: LIPASE, AMYLASE,  in the last 168 hours No results found for this basename: AMMONIA,  in the last 168 hours CBC:  Recent Labs Lab 08/13/13 0855  08/15/13 0245 08/16/13 0545 08/17/13 0355 08/18/13 0500 08/19/13 0235  WBC 8.2  < > 6.2 6.5 7.2 6.5 6.2  NEUTROABS 6.2  --   --   --   --   --   --   HGB 11.0*  < > 10.4* 11.2* 10.8* 10.4* 10.6*  HCT 33.6*  < > 30.9* 33.4* 32.3* 30.7* 31.7*  MCV 98.0  < > 96.9 95.7 95.6 94.8 96.9  PLT 89*  < > 83* 100* 121* 125* 108*  < > =  values in this interval not displayed. Cardiac Enzymes:  Recent Labs Lab 08/13/13 2125 08/14/13 0446 08/14/13 1005 08/14/13 1325 08/14/13 2254  TROPONINI <0.30 <0.30 <0.30 <0.30 <0.30   BNP (last 3 results) No results found for this basename: PROBNP,  in the last 8760 hours CBG:  Recent Labs Lab 08/18/13 1133 08/18/13 2140 08/19/13 0839 08/19/13 1133 08/19/13 1650  GLUCAP 119* 97 103* 164* 139*    Recent Results (from the past 240 hour(s))  MRSA PCR SCREENING     Status: None   Collection Time    08/13/13  8:41 PM      Result Value Range Status   MRSA by PCR NEGATIVE  NEGATIVE Final   Comment:            The GeneXpert MRSA Assay (FDA     approved for NASAL specimens     only), is one component of a     comprehensive MRSA colonization     surveillance program. It is not     intended to diagnose MRSA     infection nor to guide or     monitor treatment for     MRSA infections.  CULTURE, BLOOD (ROUTINE X 2)     Status: None   Collection Time    08/14/13  9:45 AM      Result Value Range Status   Specimen Description BLOOD RIGHT HAND   Final   Special Requests BOTTLES DRAWN AEROBIC ONLY 10CC   Final   Culture  Setup Time     Final   Value: 08/14/2013 17:44     Performed at [facility]   Culture     Final   Value:        BLOOD CULTURE RECEIVED NO GROWTH TO DATE CULTURE WILL BE HELD FOR 5 DAYS BEFORE ISSUING A FINAL NEGATIVE REPORT     Performed at [facility]   Report Status PENDING   Incomplete  CULTURE, BLOOD (ROUTINE X 2)     Status: None   Collection Time    08/14/13 10:05 AM      Result Value Range Status   Specimen Description BLOOD RIGHT ARM   Final   Special Requests BOTTLES DRAWN AEROBIC ONLY 10CC   Final   Culture  Setup Time     Final   Value: 08/14/2013 17:45     Performed at [facility]   Culture     Final  Value:        BLOOD CULTURE RECEIVED NO GROWTH TO DATE CULTURE WILL BE HELD FOR 5 DAYS BEFORE ISSUING A FINAL  NEGATIVE REPORT     Performed at [facility]   Report Status PENDING   Incomplete  CULTURE, BLOOD (SINGLE)     Status: None   Collection Time    08/14/13 10:45 AM      Result Value Range Status   Specimen Description BLOOD RIGHT HEMODIALYSIS CATHETER   Final   Special Requests     Final   Value: BOTTLES DRAWN AEROBIC AND ANAEROBIC 5CC FROM [facility] PORT OF [facility]   Culture  Setup Time     Final   Value: 08/14/2013 17:45     Performed at [facility]   Culture     Final   Value: DIPHTHEROIDS(CORYNEBACTERIUM SPECIES)     Note: Standardized susceptibility testing for this organism is not available.     Note: Gram Stain Report Called to,Read Back By and Verified With: Raelyn Number RN on 08/16/13 at 06:08 by Christie Nottingham     Performed at [facility]   Report Status 08/17/2013 FINAL   Final     Studies: Dg Ribs Unilateral W/chest Right  08/19/2013   CLINICAL DATA:  Right rib discomfort following chest impression with CPR  EXAM: RIGHT RIBS AND CHEST - 3+ VIEW  COMPARISON:  Portable chest x-ray of August 15, 2013.  FINDINGS: The chest film reveals the lungs to be borderline hypoinflated. The interstitial markings are increased bilaterally and there are areas of confluent density more centrally which may reflect engorged pulmonary vascularity. The cardiopericardial silhouette is enlarged. The dual lumen large caliber catheter placed via the right internal jugular approach appears unchanged in position with its tip in the region of the mid to distal SVC. Two right rib detail films reveal no evidence of an acute displaced or nondisplaced rib fracture. There is no pneumothorax or pleural effusion.  IMPRESSION: 1. There is no evidence of a fracture of the visualized portions of the right ribs. 2. The findings are consistent with congestive heart failure with pulmonary interstitial edema. This has become more conspicuous since the previous study.   Electronically Signed   By: David   [place]   On: 08/19/2013 14:22    Scheduled Meds: . amLODipine  10 mg Oral Daily  . doxazosin  8 mg Oral QHS  . feeding supplement (NEPRO CARB STEADY)  237 mL Oral Q24H  . hydrALAZINE  50 mg Oral TID  . insulin aspart  0-15 Units Subcutaneous TID WC  . insulin aspart  0-5 Units Subcutaneous QHS  . lisinopril  40 mg Oral Daily  . multivitamin  1 tablet Oral QHS  . sevelamer carbonate  2,400 mg Oral TID WC  . sodium chloride  3 mL Intravenous Q12H  . sodium chloride  3 mL Intravenous Q12H  . warfarin  5 mg Oral ONCE-1800  . Warfarin - Pharmacist Dosing Inpatient   Does not apply q1800   Continuous Infusions: . heparin 1,300 Units/hr (08/19/13 1149)  . norepinephrine (LEVOPHED) Adult infusion      Principal Problem:   Seizure Active Problems:   HYPERTENSION   PULMONARY HYPERTENSION   MITRAL REGURGITATION   ESRD (end stage renal disease) on dialysis   PAD (peripheral artery disease)   Unresponsive episode   Hypotension   Thrombocytopenia   R Jugular vein thrombosis   Cerebrovascular disease   CAD (coronary artery disease)  History of syncope   Aortic stenosis    Time spent: 30 min    Dayami Taitt  [facility] Pager [phone]. If 7PM-7AM, please contact night-coverage at [email or web address], password [email or web address] 08/19/2013, 5:25 PM  LOS: 6 days

## 2013-08-19 NOTE — Care Management Note (Signed)
   CARE MANAGEMENT NOTE 08/19/2013  Patient:  Briana Gould, Briana Gould   Account Number:  [id number]  Date Initiated:  08/17/2013  Documentation initiated by:  [facility]  Subjective/Objective Assessment:   Cardiac arrest - during HD. Not requiring intubation.     Action/Plan:   08/19/2013 Met with pt who wishes to use [facility] for   Anticipated DC Date:  08/20/2013   Anticipated DC Plan:  HOME W HOME HEALTH SERVICES      DC Planning Services  CM consult      Choice offered to / List presented to:     DME arranged  Levan Hurst      DME agency  [facility]     [facility] arranged  HH-1 RN  HH-2 PT      [facility] agency  [facility]   Status of service:  Completed, signed off Medicare Important Message given?   (If response is "NO", the following Medicare IM given date fields will be blank) Date Medicare IM given:   Date Additional Medicare IM given:    Discharge Disposition:  HOME W HOME HEALTH SERVICES  Per UR Regulation:  Reviewed for med. necessity/level of care/duration of stay  If discussed at Long Length of Stay Meetings, dates discussed:    Comments:  Contact:  Smith,Vicky Sister [phone]   [phone]                 Elizabeth Sauer     [phone]  08-17-13 11:30am Avie Arenas, RNBSN [phone] Talked with patient and daughter in room.  Patient lives at home with husband and grandchildren - the oldest is 3. patient drives self to dialysis.  Support from other family members also available.  patient states has no needs at home at this time.  CM will continue to follow.

## 2013-08-19 NOTE — Progress Notes (Signed)
ANTICOAGULATION CONSULT NOTE - Follow Up Consult  Pharmacy Consult for heparin Indication: DVT  Labs:  Recent Labs  08/16/13 0545 08/17/13 0355 08/17/13 0953 08/17/13 1825 08/18/13 0500 08/18/13 0700 08/19/13 0235  HGB 11.2* 10.8*  --   --  10.4*  --  10.6*  HCT 33.4* 32.3*  --   --  30.7*  --  31.7*  PLT 100* 121*  --   --  125*  --  PENDING  LABPROT  --   --   --   --  13.5  --  15.2  INR  --   --   --   --  1.05  --  1.23  HEPARINUNFRC 0.32  --   --  0.18* <0.10*  --  0.42  CREATININE 12.09*  --  6.98*  --   --  8.66*  --     Assessment/Plan:  67yo female now therapeutic on heparin after rate increase.  Will continue gtt at current rate and confirm stable with additional level.  Vernard Gambles, PharmD, BCPS  08/19/2013,3:25 AM

## 2013-08-19 NOTE — Progress Notes (Signed)
[name], female   DOB: [birth year], 67 y.o.   MRN: [id number]  [facility] Progress Note   Assessment/ Plan:   1. S/P cardiac arrest- 15 min into HD Saturday witnessed here in the hospital-also preceding 2 treatments were marked with presyncopal/syncopal events. As tolerated to conventional dialysis treatments here at the hospital with efforts to keep systolic blood pressure greater than 150 due to her diffuse cerebrovascular disease. So far, we have not had any presyncopal or syncopal events. 2. End-stage renal disease on hemodialysis: Planning for next dialysis tomorrow to reassess whether this would be safe to do as an outpatient (ultrafiltration off for systolic blood pressure less than 150). 3. Anemia: Hgb at goal for ESRD (off ESA)  4. CKD-MBD: High phosphorus- binder dosing increased yesterday-inconsistent intake secondary to nausea/poor oral intake  5. Nutrition: Low albumin, started on ONS-oral intake remains suboptimal due to continued nausea.  6. Hypertension: BP elevated but tolerated due to problems with cerebral perfusion 7. 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 Currently attempting transition from heparin to Lovenox as a bridge for a therapeutic INR.   Subjective:   Reports to be feeling better-nausea is minimal    Objective:   BP 194/75  Pulse 64  Temp(Src) 99.6 F (37.6 C) (Oral)  Resp 19  Ht 5\' 6"  (1.676 m)  Wt 87.2 kg (192 lb 3.9 oz)  BMI 31.04 kg/m2  SpO2 99%  Physical Exam: Gen: Comfortably sitting on the side of her bed CVS: Pulse regular in rate and rhythm, heart sounds S1 and S2 normal Resp: Clear to auscultation bilaterally-no rales/rhonchi Abd: Soft, flat, nontender Ext: No right lower extremity edema-left leg status post below-knee  amputation. Palpable thrill over her left upper arm arteriovenous fistula-it appears to be tortuous in its course and deep proximally.  Labs: BMET  Recent Labs Lab 08/14/13 0416 08/14/13 0735 08/14/13 1005 08/15/13 0245 08/16/13 0545 08/17/13 0953 08/18/13 0700  NA 137 137 134* 136 133* 137 135  K 5.3* 4.9 5.0 5.2* 6.4* 4.7 4.7  CL 96 95* 94* 94* 92* 92* 91*  CO2 26 26 25 25 21 25 25   GLUCOSE 133* 123* 132* 104* 154* 132* 129*  BUN 72* 72* 66* 74* 88* 41* 56*  CREATININE 9.37* 9.51* 9.08* 10.27* 12.09* 6.98* 8.66*  CALCIUM 9.5 9.3 9.6 9.3 9.7 10.0 9.6  PHOS  --  6.3*  --   --  7.9* 7.1* 7.3*   CBC  Recent Labs Lab 08/13/13 0855  08/16/13 0545 08/17/13 0355 08/18/13 0500 08/19/13 0235  WBC 8.2  < > 6.5 7.2 6.5 6.2  NEUTROABS 6.2  --   --   --   --   --   HGB 11.0*  < > 11.2* 10.8* 10.4* 10.6*  HCT 33.6*  < > 33.4* 32.3* 30.7* 31.7*  MCV 98.0  < > 95.7 95.6 94.8 96.9  PLT 89*  < > 100* 121* 125* 108*  < > = values in this interval not displayed.  Medications:    . amLODipine  10 mg Oral Daily  . doxazosin  8 mg Oral QHS  . feeding supplement (NEPRO CARB STEADY)  237 mL Oral Q24H  . hydrALAZINE  50 mg Oral TID  . insulin aspart  0-15 Units Subcutaneous TID WC  . insulin aspart  0-5 Units  Subcutaneous QHS  . lisinopril  40 mg Oral Daily  . multivitamin  1 tablet Oral QHS  . sevelamer carbonate  2,400 mg Oral TID WC  . sodium chloride  3 mL Intravenous Q12H  . sodium chloride  3 mL Intravenous Q12H  . warfarin  5 mg Oral ONCE-1800  . Warfarin - Pharmacist Dosing Inpatient   Does not apply [id number]   Zetta Bills, MD 08/19/2013, 11:10 AM

## 2013-08-19 NOTE — Progress Notes (Signed)
Pt with continued poor PO intake, states she has "no appetite." Pt also endorses no bowel movement x 1 week. States "theres just nothing there" offered prune juice pt declines at this time. Will continue to monitor.

## 2013-08-19 NOTE — Progress Notes (Signed)
ANTICOAGULATION CONSULT NOTE - Follow Up Consult  Pharmacy Consult for Heparin and Coumadin Indication: right IJ vein DVT  Patient Measurements: Height: 5\' 6"  (167.6 cm) Weight: 192 lb 3.9 oz (87.2 kg) IBW/kg (Calculated) : 59.3 Heparin Dosing Weight: 78 kg  Labs:  Recent Labs  08/17/13 0355 08/17/13 0953  08/18/13 0500 08/18/13 0700 08/19/13 0235 08/19/13 1000  HGB 10.8*  --   --  10.4*  --  10.6*  --   HCT 32.3*  --   --  30.7*  --  31.7*  --   PLT 121*  --   --  125*  --  108*  --   LABPROT  --   --   --  13.5  --  15.2  --   INR  --   --   --  1.05  --  1.23  --   HEPARINUNFRC  --   --   < > <0.10*  --  0.42 0.12*  CREATININE  --  6.98*  --   --  8.66*  --   --   < > = values in this interval not displayed.  Estimated Creatinine Clearance: 7 ml/min (by C-G formula based on Cr of 8.66).  Assessment:   Day #3 overlap of heparin and Coumadin. Right IJ vein clot, multiple areas of stenosis; high stroke risk noted.    INR 1.23 after 2 doses of coumadin 5 mg.   Heparin level therapeutic (0.42) overnight on 1300 units/hr, but confirmation level mid-morning was low at 0.12. RN reports interrupting heparin drip to draw level, since IV site is in right arm, and unable to draw from left arm due to dialysis access.  Heparin off ~30 minutes prior to lab draw, which invalidates results.  Discussed with Dr. Blake Divine.  Could do foot sticks, but will defer.  Expect change to Lovenox at discharge to complete overlap.   Discussed Coumadin use, precautions, monitoring, and potential drug-drug and drug-food interactions.  Goal of Therapy:  INR 2-3 Heparin level 0.3-0.5 units/ml (lower end of therapeutic range) Monitor platelets by anticoagulation protocol: Yes   Plan:   Continue Heparin drip at 1300 units/hr.  Coumadin 5 mg today as ordered.  Daily heparin level, CBC and PT/INR.  Would use Lovenox 80 mg sq q24hrs as overlap dose (just below 1 mg/kg, but estimated dry weight is 81  kg).  Dennie Fetters, [place] Pager: [phone] 08/19/2013 2:52 PM

## 2013-08-20 LAB — GLUCOSE, CAPILLARY
Glucose-Capillary: 94 mg/dL (ref 70–99)
Glucose-Capillary: 134 mg/dL — ABNORMAL HIGH (ref 70–99)

## 2013-08-20 LAB — CBC
Hemoglobin: 10 g/dL — ABNORMAL LOW (ref 12.0–15.0)
MCH: 32.9 pg (ref 26.0–34.0)
Platelets: 116 10*3/uL — ABNORMAL LOW (ref 150–400)
RBC: 3.04 MIL/uL — ABNORMAL LOW (ref 3.87–5.11)
WBC: 6.2 10*3/uL (ref 4.0–10.5)

## 2013-08-20 LAB — HEPARIN LEVEL (UNFRACTIONATED): Heparin Unfractionated: 0.21 IU/mL — ABNORMAL LOW (ref 0.30–0.70)

## 2013-08-20 LAB — RENAL FUNCTION PANEL
Albumin: 3.1 g/dL — ABNORMAL LOW (ref 3.5–5.2)
BUN: 54 mg/dL — ABNORMAL HIGH (ref 6–23)
Calcium: 9.2 mg/dL (ref 8.4–10.5)
Chloride: 92 mEq/L — ABNORMAL LOW (ref 96–112)
Creatinine, Ser: 6.57 mg/dL — ABNORMAL HIGH (ref 0.50–1.10)
GFR calc non Af Amer: 6 mL/min — ABNORMAL LOW (ref >90)
Potassium: 3.8 mEq/L (ref 3.5–5.1)

## 2013-08-20 LAB — CULTURE, BLOOD (ROUTINE X 2): Culture: NO GROWTH

## 2013-08-20 LAB — PROTIME-INR
INR: 1.29 (ref 0.00–1.49)
Prothrombin Time: 15.8 seconds — ABNORMAL HIGH (ref 11.6–15.2)

## 2013-08-20 MED ORDER — WARFARIN SODIUM 10 MG PO TABS
10.0000 mg | ORAL_TABLET | Freq: Once | ORAL | Status: AC
  Administered 2013-08-20: 10 mg via ORAL
  Filled 2013-08-20: qty 1

## 2013-08-20 MED ORDER — ENOXAPARIN SODIUM 80 MG/0.8ML ~~LOC~~ SOLN
80.0000 mg | SUBCUTANEOUS | Status: DC
  Administered 2013-08-20: 80 mg via SUBCUTANEOUS
  Filled 2013-08-20: qty 0.8

## 2013-08-20 MED ORDER — KETOROLAC TROMETHAMINE 30 MG/ML IJ SOLN
15.0000 mg | Freq: Once | INTRAMUSCULAR | Status: AC
  Administered 2013-08-20: 15 mg via INTRAVENOUS

## 2013-08-20 MED ORDER — NEPRO/CARBSTEADY PO LIQD: 237.0000 mL | ORAL | Status: DC

## 2013-08-20 MED ORDER — HEPARIN SODIUM (PORCINE) 1000 UNIT/ML DIALYSIS: 8000.0000 [IU] | INTRAMUSCULAR | Status: DC | PRN

## 2013-08-20 MED ORDER — ENOXAPARIN SODIUM 80 MG/0.8ML ~~LOC~~ SOLN: 80.0000 mg | SUBCUTANEOUS | Status: DC

## 2013-08-20 MED ORDER — KETOROLAC TROMETHAMINE 10 MG PO TABS: 10.0000 mg | ORAL_TABLET | Freq: Four times a day (QID) | ORAL | Status: DC | PRN

## 2013-08-20 MED ORDER — WARFARIN SODIUM 5 MG PO TABS: 5.0000 mg | ORAL_TABLET | Freq: Every day | ORAL | Status: DC

## 2013-08-20 NOTE — Progress Notes (Signed)
PT Cancellation Note  Patient Details Name: Briana Gould MRN: [id number] DOB: August 29, 1946   Cancelled Treatment:    Reason Eval/Treat Not Completed: Patient at procedure or test/unavailable Pt at HD   [facility] 08/20/2013, 8:21 AM

## 2013-08-20 NOTE — Progress Notes (Signed)
OT Cancellation Note  Patient Details Name: Briana Gould MRN: [id number] DOB: 09/16/1945   Cancelled Treatment:    Reason Eval/Treat Not Completed: Patient at procedure or test/ unavailable. Pt still in dialysis, will re attempt tomorrow  Galen Manila 08/20/2013, 11:38 AM

## 2013-08-20 NOTE — Progress Notes (Signed)
ANTICOAGULATION CONSULT NOTE - Follow Up Consult  Pharmacy Consult for Lovenox and Coumadin Indication: right IJ vein DVT  Patient Measurements: Height: 5\' 6"  (167.6 cm) Weight: 195 lb 1.7 oz (88.5 kg) IBW/kg (Calculated) : 59.3 Heparin Dosing Weight: 78 kg ESRD Dry Weight: ~81 kg  Labs:  Recent Labs  08/18/13 0500 08/18/13 0700 08/19/13 0235 08/19/13 1000 08/20/13 0559 08/20/13 0800  HGB 10.4*  --  10.6*  --  10.0*  --   HCT 30.7*  --  31.7*  --  29.1*  --   PLT 125*  --  108*  --  116*  --   LABPROT 13.5  --  15.2  --  15.8*  --   INR 1.05  --  1.23  --  1.29  --   HEPARINUNFRC <0.10*  --  0.42 0.12* 0.21*  --   CREATININE  --  8.66*  --   --   --  6.57*    Estimated Creatinine Clearance: 9.3 ml/min (by C-G formula based on Cr of 6.57).  Assessment: 67 yo F s/p cardiac arrest in HD likely d/t BP drop and cerebral hypoperfusion in setting of extensive cerebrovascular disease.  Patient found to have right IJ vein clot, multiple areas of stenosis, and deemed a high risk for CVA.  Pharmacy is consulted to transition Heparin gtt/Coumadin overlap to a Lovenox/Coumadin overlap for near future discharge plans.    This is day#4 overlap with heparin product. INR after 3 doses of 5mg  daily is only at 1.29, and the heparin level this morning was subtherapeutic at 0.21 while running at 1300 units/hr.   Goal of Therapy:  INR 2-3 Anti-Xa level 0.6-1 units/ml 4hrs after LMWH dose given  Monitor platelets by anticoagulation protocol: Yes   Plan:  - discontinue heparin gtt - initiate Lovenox sq 80mg  daily q24h approximately one hour after hep gtt stopped (89 kg, but dry wt 81 kg) - daily PT/INR and CBC - give Coumadin 10mg  PO x 1 dose tonight - monitor for s/s of bleeding - with patient still not near therapeutic INR, it is difficult to recommend a discharge regimen for Coumadin, but would likely start with 7mg  PO daily and recheck INR daily until trend and response is more clear  (she could be a late responder) - Lovenox/Coumadin overlap should be continued until 2 therapeutic INRs (drawn 24h apart) after at least 5 days total overlap with heparin products  Harrold Donath E. Achilles Dunk, PharmD Clinical Pharmacist - Resident Pager: [phone] Pharmacy: [phone] 08/20/2013 12:02 PM

## 2013-08-20 NOTE — Progress Notes (Addendum)
Pt escorted off unit accompanied by daughter and NT in wheelchair. Room checked for belongings, d/c papers signed, rx given. Instructions on lovenox, follow up appointments, and bp parameters reinforced, pt and family verbalized understanding. Teaching done on s/sx of a stroke, when to call MD, and bleeding given. Pt left room with belongings and walker.

## 2013-08-20 NOTE — Care Management Note (Signed)
   CARE MANAGEMENT NOTE 08/20/2013  Patient:  Briana Gould, Briana Gould   Account Number:  [id number]  Date Initiated:  08/17/2013  Documentation initiated by:  [facility]  Subjective/Objective Assessment:   Cardiac arrest - during HD. Not requiring intubation.     Action/Plan:   08/19/2013 Met with pt who wishes to use [facility] for [facility] services.  08/20/13 Spoke with pt who validates that Dr Margaretmary Bayley is her PCP. Contacted Dr Chestine Spore who agree to manage INR.   Anticipated DC Date:  08/20/2013   Anticipated DC Plan:  HOME W HOME HEALTH SERVICES      DC Planning Services  CM consult      Choice offered to / List presented to:     DME arranged  Levan Hurst      DME agency  [facility]     [facility] arranged  HH-1 RN  HH-2 PT      [facility] agency  [facility]   Status of service:  Completed, signed off Medicare Important Message given?   (If response is "NO", the following Medicare IM given date fields will be blank) Date Medicare IM given:   Date Additional Medicare IM given:    Discharge Disposition:  HOME W HOME HEALTH SERVICES  Per UR Regulation:  Reviewed for med. necessity/level of care/duration of stay  If discussed at Long Length of Stay Meetings, dates discussed:    Comments:  Contact:  Smith,Vicky Sister [phone]   [phone]                 Elizabeth Sauer     [phone]   08/20/2013 [facility] will draw INR on Saturday and Monday, this CM spoke with pt PCP, Dr Margaretmary Bayley, who has agreed to monitor pt INR. He will not be on call on Saturday however his on call MD will handle this on Sat and Dr Chestine Spore will be back in this office on Monday to receive results. On call MD may be reached at [phone] this weekend. [facility] notified. Johny Shock RN MPH, case manager, [phone]   08-17-13 11:30am Avie Arenas, RNBSN [phone] Talked with patient and daughter in room.  Patient lives at home with husband and grandchildren - the oldest is 23.  patient drives self to dialysis.  Support from other family members also available.  patient states has no needs at home at this time.  CM will continue to follow.

## 2013-08-20 NOTE — Progress Notes (Signed)
Pt lying in bed, appears drowsy/lethargic. Blood pressure 138/78, pulse 54, temperature 98.7 F (37.1 C), temperature source Oral, resp. rate 20, height 5\' 6"  (1.676 m), weight 88.1 kg (194 lb 3.6 oz), SpO2 99.00%., CBG 134. Will continue to monitor, MD Akula notified.

## 2013-08-20 NOTE — Progress Notes (Signed)
[name], female   DOB: May 23, 1946, 67 y.o.   MRN: [id number]   [facility] Progress Note   Assessment/ Plan:   1. S/P cardiac arrest- suspected to be from BP drop and cerebral hypoperfusion in this patient with diffuse cerebrovascular disease. We have successfully and without problems been dialyzing her with the parameter to keep SBP>150.  2. End-stage renal disease on hemodialysis: appears that she could safely dialyze as an OP as long as we keep SBP>150. Next HD Monday.  3. Anemia: Hgb at goal for ESRD (off ESA)  4. CKD-MBD: High phosphorus- binder dosing increased yesterday-inconsistent intake secondary to nausea/poor oral intake  5. Nutrition: Low albumin, started on ONS-oral intake remains suboptimal due to continued nausea.  6. Hypertension: BP elevated but tolerated due to problems with cerebral perfusion  7. 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 Currently attempting transition from heparin to Lovenox as a bridge for a therapeutic INR.   Subjective:   Reports to be feeling well and slightly scared about being on dialysis with SBP<150 (ultrafiltration is off). No further nausea.   Objective:   BP 137/56  Pulse 55  Temp(Src) 97.5 F (36.4 C) (Oral)  Resp 18  Ht 5\' 6"  (1.676 m)  Wt 88.5 kg (195 lb 1.7 oz)  BMI 31.51 kg/m2  SpO2 100%  Physical Exam: WUJ:[id number] on HD AOZ:HYQMV RRR, normal S1 and S2 Resp:CTA bilaterally, no rales/rhonchi HQI:ONGE, obese, NT, BS normal Ext:No Right LE edema. LLE s/p BKA  Labs: BMET  Recent Labs Lab 08/14/13 0735 08/14/13 1005 08/15/13 0245 08/16/13 0545 08/17/13 0953 08/18/13 0700 08/20/13 0800  NA 137 134* 136 133* 137 135 132*  K 4.9 5.0 5.2* 6.4* 4.7 4.7 3.8  CL 95* 94* 94* 92* 92* 91* 92*  CO2 26 25 25 21  25 25 25   GLUCOSE 123* 132* 104* 154* 132* 129* 161*  BUN 72* 66* 74* 88* 41* 56* 54*  CREATININE 9.51* 9.08* 10.27* 12.09* 6.98* 8.66* 6.57*  CALCIUM 9.3 9.6 9.3 9.7 10.0 9.6 9.2  PHOS 6.3*  --   --  7.9* 7.1* 7.3* 5.5*   CBC  Recent Labs Lab 08/17/13 0355 08/18/13 0500 08/19/13 0235 08/20/13 0559  WBC 7.2 6.5 6.2 6.2  HGB 10.8* 10.4* 10.6* 10.0*  HCT 32.3* 30.7* 31.7* 29.1*  MCV 95.6 94.8 96.9 95.7  PLT 121* 125* 108* 116*   Medications:    . amLODipine  10 mg Oral Daily  . doxazosin  8 mg Oral QHS  . feeding supplement (NEPRO CARB STEADY)  237 mL Oral Q24H  . hydrALAZINE  50 mg Oral TID  . insulin aspart  0-15 Units Subcutaneous TID WC  . insulin aspart  0-5 Units Subcutaneous QHS  . lisinopril  40 mg Oral Daily  . multivitamin  1 tablet Oral QHS  . sevelamer carbonate  2,400 mg Oral TID WC  . sodium chloride  3 mL Intravenous Q12H  . sodium chloride  3 mL Intravenous Q12H  . Warfarin - Pharmacist Dosing Inpatient   Does not apply [id number]   Zetta Bills, MD 08/20/2013, 10:11 AM

## 2013-08-20 NOTE — Progress Notes (Signed)
OT Cancellation Note  Patient Details Name: Briana Gould MRN: [id number] DOB: 12-30-1945   Cancelled Treatment:    Reason Eval/Treat Not Completed: Patient at procedure or test/ unavailable. Pt at dialysis, will re attempt later today as time allows/as appropriate  Galen Manila 08/20/2013, 9:41 AM

## 2013-08-20 NOTE — Procedures (Signed)
Patient seen on Hemodialysis. QB 400, UF goal Keeping even (SBP,150) Treatment adjusted as needed.  Zetta Bills MD [facility]. Office # [phone] Pager # [phone] 10:11 AM

## 2013-08-20 NOTE — Discharge Summary (Signed)
Physician Discharge Summary  Briana Gould ZOX:[id number] DOB: 1946/05/21 DOA: 08/13/2013  PCP: Briana Slimmer, MD  Admit date: 08/13/2013 Discharge date: 08/20/2013  Time spent: 38 minutes  Recommendations for Outpatient Follow-up:  1. Avoid hypotension during hemodialysis  2. Avoid excessive fluid removal during hemodialysis 3. Follow up with PCP in one week 4. Follow up with Dr Briana Gould in 2 months.  5. Follow up with cardiology as needed.    Discharge Diagnoses:  Principal Problem:   Seizure Active Problems:   HYPERTENSION   PULMONARY HYPERTENSION   MITRAL REGURGITATION   ESRD (end stage renal disease) on dialysis   PAD (peripheral artery disease)   Unresponsive episode   Hypotension   Thrombocytopenia   R Jugular vein thrombosis   Cerebrovascular disease   CAD (coronary artery disease)   History of syncope   Aortic stenosis   Discharge Condition: improved  Diet recommendation: low sodium diet/ renal diet.   Filed Weights   08/18/13 1415 08/20/13 0733 08/20/13 1148  Weight: 87.2 kg (192 lb 3.9 oz) 88.5 kg (195 lb 1.7 oz) 88.1 kg (194 lb 3.6 oz)    History of present illness:  67 yo with ESRD on HD admitted 12/05 for hypotension during HD. Transferred to ICU 12/06 after recurrent hypotension during HD with possible cardiac arrest requiring brief CPR. Not intubated and cognitively fully intact after episode.  She was found to have right IJ thrombus. MRI of the brain done and showed tiny area of altered signal intensity left motor strip. Neurology consulted  And recommended anticoagulation with IV heparin and coumadin.    Hospital Course:  1. Right IJ vein thrombosis: She was started on IV heparin and coumadin. - neurology recommended 81 mg of aspirin.  She received 5 days of IV heparin and her INR is sub therapeutic. HENCE she was being discharged on lovenox and coumadin. Recommend checking INR on Saturday and Monday. If INR IS 2 or greater, lovenox can be  discontinued.  2. ESRD ON HD - management as per renal.  3. Positive Blood cultures: contaminant. Monitoring off antibiotics.  4. Diabetes Mellitus:  -  CBG (last 3)   Recent Labs   08/19/13 0839  08/19/13 1133  08/19/13 1650   GLUCAP  103*  164*  139*    SSI.  5. RECURRENT LOC during HD  - possibly from the hypotension.  - avoid hypotension during the HD.  6. Soreness in the chest wall on the right side:  Rib series neg for fracture. Pain control.   Procedures: HD  Consultations:  RENAL  PCCM  NEUROLOGY  Procedures:  12/05 CT head: NAD  12/06 EEG: no seizure activity  12/06 MRI brain: Possible but unlikely tiny L motor strip infarct, remote R pontine infarct, old blood central pons  12/06 MRA brain: multiple bilateral moderate and high grade intracranial stenoses  12/07 Echo: moderate LVH, grade 2 diastolic dysfunction. RVSP est 45 mmHg  12/07 Carotid Dopplers: Evidence of 60-79% right ICA stenosis, borderline >80%. Severe calcific plaque noted at origin of right ICA. 1-39% left ICA stenosis. Bilateral vertebral arteries demonstrate antegrade flow, however the left is stenosed. Incidental finding of deep thrombosis of R IJ vein  12/08 Tolerated HD without hypotension  12/09 Transfer to med-surg. TRH to resume primary care duties 12/10   Discharge Exam: Filed Vitals:   08/20/13 1419  BP: 138/78  Pulse: 54  Temp: 98.7 F (37.1 C)  Resp: 20      Discharge Instructions  Discharge Orders  Future Orders Complete By Expires   Diet - low sodium heart healthy  As directed    Discharge instructions  As directed    Comments:     Follow up with PCP in 3 days.  Follow up INR on Saturday 12/13, and on Monday 12/15. Keep INR between 2 to 3. Stop lovenox injection once INR reaches 2. Continue with coumadin.  Follow up with Dr Briana Gould as recommended.   Increase activity slowly  As directed        Medication List         amLODipine 10 MG tablet  Commonly known  as:  NORVASC  Take 10 mg by mouth daily.     b complex-vitamin c-folic acid 0.8 MG Tabs tablet  Take 1 tablet by mouth daily.     doxazosin 8 MG tablet  Commonly known as:  CARDURA  Take 1 tablet (8 mg total) by mouth at bedtime.     doxazosin 8 MG tablet  Commonly known as:  CARDURA  Take 8 mg by mouth at bedtime.     enoxaparin 80 MG/0.8ML injection  Commonly known as:  LOVENOX  Inject 0.8 mLs (80 mg total) into the skin daily.     feeding supplement (NEPRO CARB STEADY) Liqd  Take 237 mLs by mouth daily.     hydrALAZINE 25 MG tablet  Commonly known as:  APRESOLINE  Take 2 tablets (50 mg total) by mouth 3 (three) times daily.     ketorolac 10 MG tablet  Commonly known as:  TORADOL  Take 1 tablet (10 mg total) by mouth every 6 (six) hours as needed.     labetalol 200 MG tablet  Commonly known as:  NORMODYNE  Take 200 mg by mouth 2 (two) times daily.     lisinopril 40 MG tablet  Commonly known as:  PRINIVIL,ZESTRIL  Take 40 mg by mouth daily.     sevelamer carbonate 800 MG tablet  Commonly known as:  RENVELA  Take 800 mg by mouth 3 (three) times daily with meals.     warfarin 5 MG tablet  Commonly known as:  COUMADIN  Take 1 tablet (5 mg total) by mouth daily.       Allergies  Allergen Reactions  . Phenergan [Promethazine Hcl] Other (See Comments)    Causes patient to" vomit more"  . Vitamin D Analogs Rash       Follow-up Information   Follow up with Gates Rigg, MD. Schedule an appointment as soon as possible for a visit in 2 months. (stroke)    Specialties:  Neurology, Radiology   Contact information:   [address] [place] [address] [phone]       Follow up with Briana Slimmer, MD. Schedule an appointment as soon as possible for a visit in 1 week. (check INR ON MONDAY. )    Specialty:  Internal Medicine   Contact information:   [address] [place] [address] [phone]        The results  of significant diagnostics from this hospitalization (including imaging, microbiology, ancillary and laboratory) are listed below for reference.    Significant Diagnostic Studies: Dg Chest 1 View  08/13/2013   CLINICAL DATA:  67 year old female with acute onset diffusion shortness of breath and chest pain while in dialysis. Initial encounter.  EXAM: CHEST - 1 VIEW  COMPARISON:  11/03/2011 and earlier.  FINDINGS: Portable AP upright view at 0923 hrs. Right IJ approach dual lumen  dialysis catheter in place. Stable lung volumes. Stable cardiomegaly and mediastinal contours. No pneumothorax, pulmonary edema, pleural effusion or consolidation. Overall decreased pulmonary vascularity compared to the previous exam. Multiple wires an EKG leads overlie the chest.  IMPRESSION: No acute cardiopulmonary abnormality.   Electronically Signed   By: Augusto Gamble M.D.   On: 08/13/2013 10:06   Dg Ribs Unilateral W/chest Right  08/19/2013   CLINICAL DATA:  Right rib discomfort following chest impression with CPR  EXAM: RIGHT RIBS AND CHEST - 3+ VIEW  COMPARISON:  Portable chest x-ray of August 15, 2013.  FINDINGS: The chest film reveals the lungs to be borderline hypoinflated. The interstitial markings are increased bilaterally and there are areas of confluent density more centrally which may reflect engorged pulmonary vascularity. The cardiopericardial silhouette is enlarged. The dual lumen large caliber catheter placed via the right internal jugular approach appears unchanged in position with its tip in the region of the mid to distal SVC. Two right rib detail films reveal no evidence of an acute displaced or nondisplaced rib fracture. There is no pneumothorax or pleural effusion.  IMPRESSION: 1. There is no evidence of a fracture of the visualized portions of the right ribs. 2. The findings are consistent with congestive heart failure with pulmonary interstitial edema. This has become more conspicuous since the previous  study.   Electronically Signed   By: David  [place]   On: 08/19/2013 14:22   Ct Head Wo Contrast  08/13/2013   CLINICAL DATA:  Loss of consciousness, seizure.  EXAM: CT HEAD WITHOUT CONTRAST  TECHNIQUE: Contiguous axial images were obtained from the base of the skull through the vertex without intravenous contrast.  COMPARISON:  August 29, 2010.  FINDINGS: Bony calvarium appears intact. Diffuse cortical atrophy is noted. No mass effect or midline shift is noted. Ventricular size is within normal limits. There is no evidence of mass lesion, hemorrhage or acute infarction.  IMPRESSION: Mild diffuse cortical atrophy. No acute intracranial abnormality seen.   Electronically Signed   By: Roque Lias M.D.   On: 08/13/2013 09:58   Mr Shirlee Latch Wo Contrast  08/14/2013   CLINICAL DATA:  Diabetic hypertensive patient with end-stage renal disease on dialysis experienced a brief episode of lightheadedness and possible seizure activity well on dialysis.  EXAM: MRI HEAD WITHOUT CONTRAST  MRA HEAD WITHOUT CONTRAST  TECHNIQUE: Multiplanar, multiecho pulse sequences of the brain and surrounding structures were obtained without intravenous contrast. Angiographic images of the head were obtained using MRA technique without contrast.  COMPARISON:  08/13/2013 CT.  No comparison MR.  FINDINGS: MRI HEAD FINDINGS  Some of the sequences are motion degraded.  Tiny area of altered signal intensity left motor strip (series 3, image 25) may represent T2 shine through rather than acute infarct however if the patient has symptoms referable to the left motor strip than a tiny infarct at this level would need to be considered. Immediately inferior to this region there is evidence of a remote small infarct.  Remote right paracentral pontine infarct. Moderate small vessel disease type changes.  Small area of blood breakdown products central pons probably related to episode of hemorrhagic ischemia otherwise no evidence of intracranial  hemorrhage.  No intracranial mass lesion noted on this unenhanced exam.  Global atrophy without hydrocephalus.  Altered bone marrow signal intensity consistent with anemia of renal disease.  Cervical spondylotic changes C3-4 and C4-5  Partially empty sella incidentally noted.  Mild exophthalmos.  Minimal paranasal sinus mucosal thickening.  Partial opacification inferior left mastoid air cells.  MRA HEAD FINDINGS  Marked high-grade tandem stenosis of the pre cavernous segment of the left internal carotid artery.  High-grade stenosis majority of the A1 segment of the left anterior cerebral artery.  Moderate stenosis right internal carotid artery pre cavernous and supraclinoid segment  Moderate to marked focal stenosis proximal M1 segment right middle cerebral artery.  Moderate middle cerebral artery and A2 segment anterior cerebral artery branch vessel narrowing and irregularity.  High-grade tandem stenosis right vertebral artery.  Non visualized posterior inferior cerebellar artery bilaterally.  Nonvisualized right anterior inferior cerebellar artery.  Moderate to marked focal narrowing proximal left anterior inferior cerebellar artery.  Moderate long segment stenosis proximal to mid left posterior cerebral artery.  High-grade stenosis mid to distal right posterior cerebral artery.  No aneurysm noted.  IMPRESSION: Tiny area of altered signal intensity left motor strip may represent T2 shine through rather than acute infarct however if the patient has symptoms referable to the left motor strip than a tiny infarct at this level would need to be considered.  Remote right paracentral pontine infarct. Moderate small vessel disease type changes.  Small area of blood breakdown products central pons probably related to episode of hemorrhagic ischemia otherwise no evidence of intracranial hemorrhage.  Significant intracranial atherosclerotic type changes as detailed above.   Electronically Signed   By: Bridgett Larsson M.D.    On: 08/14/2013 18:04   Mr Brain Wo Contrast  08/14/2013   CLINICAL DATA:  Diabetic hypertensive patient with end-stage renal disease on dialysis experienced a brief episode of lightheadedness and possible seizure activity well on dialysis.  EXAM: MRI HEAD WITHOUT CONTRAST  MRA HEAD WITHOUT CONTRAST  TECHNIQUE: Multiplanar, multiecho pulse sequences of the brain and surrounding structures were obtained without intravenous contrast. Angiographic images of the head were obtained using MRA technique without contrast.  COMPARISON:  08/13/2013 CT.  No comparison MR.  FINDINGS: MRI HEAD FINDINGS  Some of the sequences are motion degraded.  Tiny area of altered signal intensity left motor strip (series 3, image 25) may represent T2 shine through rather than acute infarct however if the patient has symptoms referable to the left motor strip than a tiny infarct at this level would need to be considered. Immediately inferior to this region there is evidence of a remote small infarct.  Remote right paracentral pontine infarct. Moderate small vessel disease type changes.  Small area of blood breakdown products central pons probably related to episode of hemorrhagic ischemia otherwise no evidence of intracranial hemorrhage.  No intracranial mass lesion noted on this unenhanced exam.  Global atrophy without hydrocephalus.  Altered bone marrow signal intensity consistent with anemia of renal disease.  Cervical spondylotic changes C3-4 and C4-5  Partially empty sella incidentally noted.  Mild exophthalmos.  Minimal paranasal sinus mucosal thickening.  Partial opacification inferior left mastoid air cells.  MRA HEAD FINDINGS  Marked high-grade tandem stenosis of the pre cavernous segment of the left internal carotid artery.  High-grade stenosis majority of the A1 segment of the left anterior cerebral artery.  Moderate stenosis right internal carotid artery pre cavernous and supraclinoid segment  Moderate to marked focal stenosis  proximal M1 segment right middle cerebral artery.  Moderate middle cerebral artery and A2 segment anterior cerebral artery branch vessel narrowing and irregularity.  High-grade tandem stenosis right vertebral artery.  Non visualized posterior inferior cerebellar artery bilaterally.  Nonvisualized right anterior inferior cerebellar artery.  Moderate to marked focal narrowing proximal left  anterior inferior cerebellar artery.  Moderate long segment stenosis proximal to mid left posterior cerebral artery.  High-grade stenosis mid to distal right posterior cerebral artery.  No aneurysm noted.  IMPRESSION: Tiny area of altered signal intensity left motor strip may represent T2 shine through rather than acute infarct however if the patient has symptoms referable to the left motor strip than a tiny infarct at this level would need to be considered.  Remote right paracentral pontine infarct. Moderate small vessel disease type changes.  Small area of blood breakdown products central pons probably related to episode of hemorrhagic ischemia otherwise no evidence of intracranial hemorrhage.  Significant intracranial atherosclerotic type changes as detailed above.   Electronically Signed   By: Bridgett Larsson M.D.   On: 08/14/2013 18:04   Dg Chest Port 1 View  08/15/2013   CLINICAL DATA:  Respiratory failure.  EXAM: PORTABLE CHEST - 1 VIEW  COMPARISON:  08/14/2013 and 08/13/2013, and 11/03/2011  FINDINGS: There is chronic cardiomegaly. Pulmonary vascularity is slightly more prominent than on the prior study. Double-lumen central venous catheter is unchanged. No effusions. No acute osseous abnormality.  IMPRESSION: Slight increased pulmonary vascularity since the prior study. Chronic cardiomegaly.   Electronically Signed   By: Geanie Cooley M.D.   On: 08/15/2013 08:33   Dg Chest Port 1 View  08/14/2013   CLINICAL DATA:  Unresponsive, possible aspiration  EXAM: PORTABLE CHEST - 1 VIEW  COMPARISON:  Chest x-ray August 13, 2013   FINDINGS: The lungs are adequately inflated. There is no focal infiltrate nor evidence of atelectasis. There is no pleural effusion or pneumothorax. The cardiopericardial silhouette remains enlarged. The pulmonary vascularity is not engorged. The dual-lumen dialysis type catheter appears unchanged via the right internal jugular approach. The mediastinum is normal in width. The observed portions of the bony thorax appear normal.  IMPRESSION: There is no evidence of aspiration or significant atelectasis. There is stable mild enlargement of the cardiac silhouette without evidence of pulmonary edema.   Electronically Signed   By: David  [place]   On: 08/14/2013 09:20    Microbiology: Recent Results (from the past 240 hour(s))  MRSA PCR SCREENING     Status: None   Collection Time    08/13/13  8:41 PM      Result Value Range Status   MRSA by PCR NEGATIVE  NEGATIVE Final   Comment:            The GeneXpert MRSA Assay (FDA     approved for NASAL specimens     only), is one component of a     comprehensive MRSA colonization     surveillance program. It is not     intended to diagnose MRSA     infection nor to guide or     monitor treatment for     MRSA infections.  CULTURE, BLOOD (ROUTINE X 2)     Status: None   Collection Time    08/14/13  9:45 AM      Result Value Range Status   Specimen Description BLOOD RIGHT HAND   Final   Special Requests BOTTLES DRAWN AEROBIC ONLY 10CC   Final   Culture  Setup Time     Final   Value: 08/14/2013 17:44     Performed at [facility]   Culture     Final   Value: NO GROWTH 5 DAYS     Performed at [facility]   Report Status 08/20/2013 FINAL  Final  CULTURE, BLOOD (ROUTINE X 2)     Status: None   Collection Time    08/14/13 10:05 AM      Result Value Range Status   Specimen Description BLOOD RIGHT ARM   Final   Special Requests BOTTLES DRAWN AEROBIC ONLY 10CC   Final   Culture  Setup Time     Final   Value: 08/14/2013 17:45      Performed at [facility]   Culture     Final   Value: NO GROWTH 5 DAYS     Performed at [facility]   Report Status 08/20/2013 FINAL   Final  CULTURE, BLOOD (SINGLE)     Status: None   Collection Time    08/14/13 10:45 AM      Result Value Range Status   Specimen Description BLOOD RIGHT HEMODIALYSIS CATHETER   Final   Special Requests     Final   Value: BOTTLES DRAWN AEROBIC AND ANAEROBIC 5CC FROM [facility]   Culture  Setup Time     Final   Value: 08/14/2013 17:45     Performed at [facility]   Culture     Final   Value: DIPHTHEROIDS(CORYNEBACTERIUM SPECIES)     Note: Standardized susceptibility testing for this organism is not available.     Note: Gram Stain Report Called to,Read Back By and Verified With: Raelyn Number RN on 08/16/13 at 06:08 by Christie Nottingham     Performed at [facility]   Report Status 08/17/2013 FINAL   Final     Labs: Basic Metabolic Panel:  Recent Labs Lab 08/14/13 0735  08/15/13 0245 08/16/13 0545 08/17/13 0953 08/18/13 0700 08/20/13 0800  NA 137  < > 136 133* 137 135 132*  K 4.9  < > 5.2* 6.4* 4.7 4.7 3.8  CL 95*  < > 94* 92* 92* 91* 92*  CO2 26  < > 25 21 25 25 25   GLUCOSE 123*  < > 104* 154* 132* 129* 161*  BUN 72*  < > 74* 88* 41* 56* 54*  CREATININE 9.51*  < > 10.27* 12.09* 6.98* 8.66* 6.57*  CALCIUM 9.3  < > 9.3 9.7 10.0 9.6 9.2  PHOS 6.3*  --   --  7.9* 7.1* 7.3* 5.5*  < > = values in this interval not displayed. Liver Function Tests:  Recent Labs Lab 08/15/13 0245 08/16/13 0545 08/17/13 0953 08/18/13 0700 08/20/13 0800  AST 19  --   --   --   --   ALT 16  --   --   --   --   ALKPHOS 77  --   --   --   --   BILITOT 0.3  --   --   --   --   PROT 6.5  --   --   --   --   ALBUMIN 3.2* 3.5 3.4* 3.3* 3.1*   No results found for this basename: LIPASE, AMYLASE,  in the last 168 hours No results found for this basename: AMMONIA,  in the last 168 hours CBC:  Recent Labs Lab 08/16/13 0545  08/17/13 0355 08/18/13 0500 08/19/13 0235 08/20/13 0559  WBC 6.5 7.2 6.5 6.2 6.2  HGB 11.2* 10.8* 10.4* 10.6* 10.0*  HCT 33.4* 32.3* 30.7* 31.7* 29.1*  MCV 95.7 95.6 94.8 96.9 95.7  PLT 100* 121* 125* 108* 116*   Cardiac Enzymes:  Recent Labs Lab 08/13/13 2125 08/14/13 0446 08/14/13 1005  08/14/13 1325 08/14/13 2254  TROPONINI <0.30 <0.30 <0.30 <0.30 <0.30   BNP: BNP (last 3 results) No results found for this basename: PROBNP,  in the last 8760 hours CBG:  Recent Labs Lab 08/19/13 0839 08/19/13 1133 08/19/13 1650 08/19/13 2108 08/20/13 1209  GLUCAP 103* 164* 139* 198* 94       Signed:  Anagha Loseke  [facility] 08/20/2013, 2:45 PM

## 2013-08-29 ENCOUNTER — Inpatient Hospital Stay (HOSPITAL_COMMUNITY): Payer: Medicare Other

## 2013-08-29 ENCOUNTER — Encounter (HOSPITAL_COMMUNITY): Payer: Self-pay | Admitting: Emergency Medicine

## 2013-08-29 ENCOUNTER — Inpatient Hospital Stay (HOSPITAL_COMMUNITY)
Admission: EM | Admit: 2013-08-29 | Discharge: 2013-09-05 | DRG: 377 | Disposition: A | Discharge status: Home Health | Payer: Medicare Other | Attending: Internal Medicine | Admitting: Internal Medicine

## 2013-08-29 DIAGNOSIS — K209 Esophagitis, unspecified without bleeding: Secondary | ICD-10-CM | POA: Diagnosis present

## 2013-08-29 DIAGNOSIS — K922 Gastrointestinal hemorrhage, unspecified: Principal | ICD-10-CM

## 2013-08-29 DIAGNOSIS — K269 Duodenal ulcer, unspecified as acute or chronic, without hemorrhage or perforation: Secondary | ICD-10-CM | POA: Diagnosis present

## 2013-08-29 DIAGNOSIS — K2980 Duodenitis without bleeding: Secondary | ICD-10-CM | POA: Diagnosis present

## 2013-08-29 DIAGNOSIS — I82890 Acute embolism and thrombosis of other specified veins: Secondary | ICD-10-CM

## 2013-08-29 DIAGNOSIS — I679 Cerebrovascular disease, unspecified: Secondary | ICD-10-CM

## 2013-08-29 DIAGNOSIS — I10 Essential (primary) hypertension: Secondary | ICD-10-CM

## 2013-08-29 DIAGNOSIS — N186 End stage renal disease: Secondary | ICD-10-CM

## 2013-08-29 DIAGNOSIS — Z992 Dependence on renal dialysis: Secondary | ICD-10-CM | POA: Diagnosis present

## 2013-08-29 DIAGNOSIS — K259 Gastric ulcer, unspecified as acute or chronic, without hemorrhage or perforation: Secondary | ICD-10-CM | POA: Diagnosis present

## 2013-08-29 DIAGNOSIS — I739 Peripheral vascular disease, unspecified: Secondary | ICD-10-CM

## 2013-08-29 DIAGNOSIS — K2970 Gastritis, unspecified, without bleeding: Secondary | ICD-10-CM | POA: Diagnosis present

## 2013-08-29 DIAGNOSIS — R531 Weakness: Secondary | ICD-10-CM

## 2013-08-29 DIAGNOSIS — E119 Type 2 diabetes mellitus without complications: Secondary | ICD-10-CM

## 2013-08-29 DIAGNOSIS — N2581 Secondary hyperparathyroidism of renal origin: Secondary | ICD-10-CM

## 2013-08-29 DIAGNOSIS — E1122 Type 2 diabetes mellitus with diabetic chronic kidney disease: Secondary | ICD-10-CM | POA: Diagnosis present

## 2013-08-29 DIAGNOSIS — S88119A Complete traumatic amputation at level between knee and ankle, unspecified lower leg, initial encounter: Secondary | ICD-10-CM

## 2013-08-29 DIAGNOSIS — E669 Obesity, unspecified: Secondary | ICD-10-CM

## 2013-08-29 DIAGNOSIS — I120 Hypertensive chronic kidney disease with stage 5 chronic kidney disease or end stage renal disease: Secondary | ICD-10-CM | POA: Diagnosis present

## 2013-08-29 DIAGNOSIS — I2789 Other specified pulmonary heart diseases: Secondary | ICD-10-CM

## 2013-08-29 DIAGNOSIS — Z7901 Long term (current) use of anticoagulants: Secondary | ICD-10-CM

## 2013-08-29 DIAGNOSIS — D62 Acute posthemorrhagic anemia: Secondary | ICD-10-CM | POA: Diagnosis present

## 2013-08-29 DIAGNOSIS — I2510 Atherosclerotic heart disease of native coronary artery without angina pectoris: Secondary | ICD-10-CM

## 2013-08-29 DIAGNOSIS — Z8674 Personal history of sudden cardiac arrest: Secondary | ICD-10-CM

## 2013-08-29 DIAGNOSIS — I080 Rheumatic disorders of both mitral and aortic valves: Secondary | ICD-10-CM

## 2013-08-29 DIAGNOSIS — R569 Unspecified convulsions: Secondary | ICD-10-CM

## 2013-08-29 DIAGNOSIS — R001 Bradycardia, unspecified: Secondary | ICD-10-CM

## 2013-08-29 DIAGNOSIS — K2960 Other gastritis without bleeding: Secondary | ICD-10-CM | POA: Diagnosis present

## 2013-08-29 DIAGNOSIS — D696 Thrombocytopenia, unspecified: Secondary | ICD-10-CM

## 2013-08-29 DIAGNOSIS — Z86718 Personal history of other venous thrombosis and embolism: Secondary | ICD-10-CM

## 2013-08-29 DIAGNOSIS — Z87898 Personal history of other specified conditions: Secondary | ICD-10-CM

## 2013-08-29 DIAGNOSIS — D631 Anemia in chronic kidney disease: Secondary | ICD-10-CM

## 2013-08-29 DIAGNOSIS — K449 Diaphragmatic hernia without obstruction or gangrene: Secondary | ICD-10-CM | POA: Diagnosis present

## 2013-08-29 DIAGNOSIS — I359 Nonrheumatic aortic valve disorder, unspecified: Secondary | ICD-10-CM

## 2013-08-29 DIAGNOSIS — E785 Hyperlipidemia, unspecified: Secondary | ICD-10-CM

## 2013-08-29 DIAGNOSIS — I498 Other specified cardiac arrhythmias: Secondary | ICD-10-CM

## 2013-08-29 DIAGNOSIS — Z79899 Other long term (current) drug therapy: Secondary | ICD-10-CM

## 2013-08-29 DIAGNOSIS — Z8673 Personal history of transient ischemic attack (TIA), and cerebral infarction without residual deficits: Secondary | ICD-10-CM

## 2013-08-29 DIAGNOSIS — I350 Nonrheumatic aortic (valve) stenosis: Secondary | ICD-10-CM

## 2013-08-29 HISTORY — DX: Acute embolism and thrombosis of unspecified deep veins of unspecified lower extremity: I82.409

## 2013-08-29 HISTORY — DX: Nonrheumatic aortic (valve) stenosis: I35.0

## 2013-08-29 HISTORY — DX: Syncope and collapse: R55

## 2013-08-29 HISTORY — DX: Pulmonary hypertension, unspecified: I27.20

## 2013-08-29 HISTORY — DX: Cerebral infarction, unspecified: I63.9

## 2013-08-29 HISTORY — DX: Thrombocytopenia, unspecified: D69.6

## 2013-08-29 HISTORY — DX: Cerebrovascular disease, unspecified: I67.9

## 2013-08-29 HISTORY — DX: Disorder of arteries and arterioles, unspecified: I77.9

## 2013-08-29 HISTORY — DX: Cardiac arrest, cause unspecified: I46.9

## 2013-08-29 HISTORY — DX: Peripheral vascular disease, unspecified: I73.9

## 2013-08-29 HISTORY — DX: Atherosclerotic heart disease of native coronary artery without angina pectoris: I25.10

## 2013-08-29 LAB — COMPREHENSIVE METABOLIC PANEL
Albumin: 3.2 g/dL — ABNORMAL LOW (ref 3.5–5.2)
BUN: 88 mg/dL — ABNORMAL HIGH (ref 6–23)
Calcium: 9.9 mg/dL (ref 8.4–10.5)
Creatinine, Ser: 6.67 mg/dL — ABNORMAL HIGH (ref 0.50–1.10)
GFR calc Af Amer: 7 mL/min — ABNORMAL LOW (ref >90)
Glucose, Bld: 173 mg/dL — ABNORMAL HIGH (ref 70–99)
Potassium: 4.2 mEq/L (ref 3.5–5.1)
Total Protein: 6.7 g/dL (ref 6.0–8.3)

## 2013-08-29 LAB — CBC
HCT: 18.9 % — ABNORMAL LOW (ref 36.0–46.0)
HCT: 22.8 % — ABNORMAL LOW (ref 36.0–46.0)
Hemoglobin: 6.6 g/dL — CL (ref 12.0–15.0)
Hemoglobin: 7.7 g/dL — ABNORMAL LOW (ref 12.0–15.0)
MCH: 32.5 pg (ref 26.0–34.0)
MCH: 31.3 pg (ref 26.0–34.0)
MCHC: 34.9 g/dL (ref 30.0–36.0)
MCHC: 33.8 g/dL (ref 30.0–36.0)
MCV: 92.7 fL (ref 78.0–100.0)
RBC: 2.03 MIL/uL — ABNORMAL LOW (ref 3.87–5.11)
RBC: 2.46 MIL/uL — ABNORMAL LOW (ref 3.87–5.11)
RDW: 15.3 % (ref 11.5–15.5)
WBC: 11 10*3/uL — ABNORMAL HIGH (ref 4.0–10.5)

## 2013-08-29 LAB — APTT: aPTT: 35 seconds (ref 24–37)

## 2013-08-29 LAB — CBC WITH DIFFERENTIAL/PLATELET
Basophils Absolute: 0 10*3/uL (ref 0.0–0.1)
Basophils Relative: 0 % (ref 0–1)
Eosinophils Absolute: 0.2 10*3/uL (ref 0.0–0.7)
Eosinophils Relative: 2 % (ref 0–5)
HCT: 21.9 % — ABNORMAL LOW (ref 36.0–46.0)
Hemoglobin: 7.3 g/dL — ABNORMAL LOW (ref 12.0–15.0)
Lymphs Abs: 1.8 10*3/uL (ref 0.7–4.0)
MCH: 31.6 pg (ref 26.0–34.0)
MCHC: 33.3 g/dL (ref 30.0–36.0)
MCV: 94.8 fL (ref 78.0–100.0)
Monocytes Absolute: 0.5 10*3/uL (ref 0.1–1.0)
Monocytes Relative: 4 % (ref 3–12)
Neutrophils Relative %: 79 % — ABNORMAL HIGH (ref 43–77)
RBC: 2.31 MIL/uL — ABNORMAL LOW (ref 3.87–5.11)
RDW: 15.1 % (ref 11.5–15.5)

## 2013-08-29 LAB — PROTIME-INR
INR: 1.6 — ABNORMAL HIGH (ref 0.00–1.49)
INR: 1.54 — ABNORMAL HIGH (ref 0.00–1.49)
Prothrombin Time: 18.6 seconds — ABNORMAL HIGH (ref 11.6–15.2)
Prothrombin Time: 18.1 seconds — ABNORMAL HIGH (ref 11.6–15.2)

## 2013-08-29 LAB — TROPONIN I
Troponin I: <0.3 ng/mL (ref <0.30)
Troponin I: <0.3 ng/mL (ref <0.30)
Troponin I: <0.3 ng/mL (ref <0.30)

## 2013-08-29 LAB — LACTIC ACID, PLASMA: Lactic Acid, Venous: 2.4 mmol/L — ABNORMAL HIGH (ref 0.5–2.2)

## 2013-08-29 LAB — GLUCOSE, CAPILLARY: Glucose-Capillary: 144 mg/dL — ABNORMAL HIGH (ref 70–99)

## 2013-08-29 LAB — OCCULT BLOOD, POC DEVICE: Fecal Occult Bld: POSITIVE — AB

## 2013-08-29 MED ORDER — DEXTROSE 5 % IV SOLN
10.0000 mg | Freq: Once | INTRAVENOUS | Status: AC
  Administered 2013-08-29: 10 mg via INTRAVENOUS
  Filled 2013-08-29: qty 1

## 2013-08-29 MED ORDER — PANTOPRAZOLE SODIUM 40 MG IV SOLR: 40.0000 mg | Freq: Once | INTRAVENOUS | Status: DC

## 2013-08-29 MED ORDER — ATROPINE SULFATE 1 MG/ML IJ SOLN
INTRAMUSCULAR | Status: AC
  Filled 2013-08-29: qty 1

## 2013-08-29 MED ORDER — INSULIN ASPART 100 UNIT/ML ~~LOC~~ SOLN
0.0000 [IU] | SUBCUTANEOUS | Status: DC
  Administered 2013-08-29: 2 [IU] via SUBCUTANEOUS
  Administered 2013-08-30: 3 [IU] via SUBCUTANEOUS
  Administered 2013-08-30: 2 [IU] via SUBCUTANEOUS
  Administered 2013-08-30: 3 [IU] via SUBCUTANEOUS
  Administered 2013-08-30: 2 [IU] via SUBCUTANEOUS
  Administered 2013-08-30 – 2013-08-31 (×3): 3 [IU] via SUBCUTANEOUS
  Administered 2013-08-31 – 2013-09-01 (×2): 2 [IU] via SUBCUTANEOUS
  Administered 2013-09-01: 3 [IU] via SUBCUTANEOUS
  Administered 2013-09-01 (×3): 2 [IU] via SUBCUTANEOUS
  Administered 2013-09-02: 5 [IU] via SUBCUTANEOUS
  Administered 2013-09-02: 3 [IU] via SUBCUTANEOUS
  Administered 2013-09-02: 2 [IU] via SUBCUTANEOUS
  Administered 2013-09-02 – 2013-09-03 (×3): 3 [IU] via SUBCUTANEOUS

## 2013-08-29 MED ORDER — SODIUM CHLORIDE 0.9 % IV BOLUS (SEPSIS)
500.0000 mL | Freq: Once | INTRAVENOUS | Status: AC
  Administered 2013-08-29: 500 mL via INTRAVENOUS

## 2013-08-29 MED ORDER — DARBEPOETIN ALFA-POLYSORBATE 40 MCG/0.4ML IJ SOLN: 100.0000 ug | INTRAMUSCULAR | Status: DC

## 2013-08-29 MED ORDER — PANTOPRAZOLE SODIUM 40 MG IV SOLR
40.0000 mg | Freq: Once | INTRAVENOUS | Status: AC
  Administered 2013-08-29: 40 mg via INTRAVENOUS
  Filled 2013-08-29: qty 40

## 2013-08-29 MED ORDER — SODIUM CHLORIDE 0.9 % IV SOLN
8.0000 mg/h | INTRAVENOUS | Status: AC
  Administered 2013-08-29 – 2013-09-01 (×6): 8 mg/h via INTRAVENOUS
  Filled 2013-08-29 (×13): qty 80

## 2013-08-29 MED ORDER — ONDANSETRON HCL 4 MG/2ML IJ SOLN
INTRAMUSCULAR | Status: AC
  Administered 2013-08-29: 4 mg
  Filled 2013-08-29: qty 2

## 2013-08-29 MED ORDER — ONDANSETRON HCL 4 MG/2ML IJ SOLN
4.0000 mg | Freq: Once | INTRAMUSCULAR | Status: AC
  Administered 2013-08-29: 4 mg via INTRAVENOUS

## 2013-08-29 MED ORDER — SODIUM CHLORIDE 0.9 % IV SOLN
15.0000 ug | Freq: Once | INTRAVENOUS | Status: AC
  Administered 2013-08-29: 15.2 ug via INTRAVENOUS
  Filled 2013-08-29: qty 3.8

## 2013-08-29 MED ORDER — PANTOPRAZOLE SODIUM 40 MG IV SOLR
40.0000 mg | Freq: Two times a day (BID) | INTRAVENOUS | Status: DC
  Administered 2013-09-01 – 2013-09-02 (×2): 40 mg via INTRAVENOUS
  Filled 2013-08-29 (×6): qty 40

## 2013-08-29 NOTE — ED Notes (Signed)
Heart rate dropping to 38- 40's , Dr. Fayrene Fearing and Ocie Cornfield notified. In room with pt. Charge Nurse in room, started 2nd IV.

## 2013-08-29 NOTE — Progress Notes (Addendum)
Pt transferred to , ICU NG lavage neg, with yellowish return Hgb down to 6.6, before pRBC transfusion Will plan EGD tomorrow after transfusion with FFP and pRBC Cardiac monitoring for recurrent bradycardia, cards consult if further junctional rhythm.  Cycling cardiac enzymes PPI gtt Holding anticoagulation EGD will be with Dr. Elnoria Howard, [facility]

## 2013-08-29 NOTE — ED Notes (Signed)
Dr. Fayrene Fearing in room -- discussed blood transfusion with patient. Agreed to sign permit.

## 2013-08-29 NOTE — ED Notes (Signed)
CCM -- Dr. Tyson Alias in to see.

## 2013-08-29 NOTE — ED Notes (Signed)
Report given Aundra Millet on -08 ,

## 2013-08-29 NOTE — ED Notes (Signed)
Pt to ED via [facility] from home, with c/o vomiting blood and black tarry stools. Hx dialysis pt, had dialysis x 4 last week, scheduled for today-- pt states started having tarry stools two days ago and started vomiting this am. Emesis is coffee ground in appearance

## 2013-08-29 NOTE — ED Provider Notes (Signed)
CSN: [id number]     Arrival date & time 08/29/13  0725 History   First MD Initiated Contact with Patient 08/29/13 [phone]     Chief Complaint  Patient presents with  . GI Bleeding   (Consider location/radiation/quality/duration/timing/severity/associated sxs/prior Treatment) The history is provided by the patient and a relative. No language interpreter was used.  Briana Gould is a 67 year old female with past medical history of hypertension, peripheral artery disease, type 2 diabetes, end-stage renal disease - patient was due to have dialysis this morning at 7:30 AM - presenting to emergency department, with daughter, presenting with hematemesis that started this morning. As per patient, reported that she had approximately 4 episodes of emesis all consisting of a coffee ground emesis. Daughter reported that mother lives at home with father. Patient reported that she's been having dark stools that started yesterday afternoon, reported that the stools are dark with every bowel movement. Patient reported that she's currently taking Lovenox and Coumadin-Lovenox 0.8 mm injections daily and 5 mg of Coumadin daily. Daughter reported that patient was recently admitted to the hospital due to syncopal episode-reported that on 08/14/2013 while patient was getting dialysis performed patient coded and CPR needed to be performed. Patient reports she continues to have chest discomfort, described as a soreness sensation secondary to her catheter being placed in her right chest as well as chest compressions that occurred. Daughter reported that patient has been feeling weak, that is active as she normally has. Denied abdominal pain, difficulty breathing, shortness of breath, dizziness, hematochezia. PCP Dr. Margaretmary Bayley  Past Medical History  Diagnosis Date  . Hypertension   . PAD (peripheral artery disease)     a. L BKA  . Type II diabetes mellitus   . Anemia   . History of blood transfusion 2002     "related to amputation" (08/13/2013)  . End stage renal disease on dialysis     "[facility]; M, W, F" (08/13/2013)  . DVT (deep venous thrombosis)     a. R IJ DVT 08/2013.  Marland Kitchen CVA (cerebral infarction)     a. MRI 08/2013: possible left motor strip infarct with remote pontine infarct.  . Pulmonary HTN     a. Cath 2011 - mod to severe pulmonary hypertension. b. Echo 08/2013: PAP (mild-mod increased).  . Thrombocytopenia   . CAD (coronary artery disease)     a. Severe diffuse diabetic disease by cath 2011, for med rx, not a candidate for CABG or PCI at that time.  . Aortic stenosis   . Syncope     a. 2011, etiology never clear.  . Carotid artery disease     a. Dopp 07/9146: 1-39% LICA< 60-79% RICA borderline >80%.  . Cerebrovascular disease     a. 08/2013 MRA brain: multiple bilateral moderate and high grade intracranial stenoses  . Cardiac arrest 08/14/13    a. Possible arrest in setting of hypotension during dialysis with possible arrest requiring brief CPR, not intubated and was fully intact after episode. Suspected due to low cerebral perfusion in setting of hypotension. Unclear if any arrhythmia.   Past Surgical History  Procedure Laterality Date  . Av fistula placement Left 2011    upper arm  . Below knee leg amputation Left 2002  . Tonsillectomy    . Cataract extraction w/ intraocular lens  implant, bilateral Bilateral 2012-2014   Family History  Problem Relation Age of Onset  . Hypertension Mother   . Hypertension Father    History  Substance Use Topics  . Smoking status: Never Smoker   . Smokeless tobacco: Never Used  . Alcohol Use: No   OB History   Grav Para Term Preterm Abortions TAB SAB Ect Mult Living                 Review of Systems  Constitutional: Negative for fever and chills.  Respiratory: Positive for chest tightness. Negative for shortness of breath.   Cardiovascular: Negative for chest pain.  Gastrointestinal: Positive for nausea,  vomiting and blood in stool. Negative for abdominal pain, diarrhea and constipation.  Musculoskeletal: Negative for back pain.  All other systems reviewed and are negative.    Allergies  Phenergan and Vitamin d analogs  Home Medications   No current outpatient prescriptions on file. BP 184/55  Pulse 64  Temp(Src) 98.4 F (36.9 C) (Oral)  Resp 14  Ht 5\' 6"  (1.676 m)  Wt 182 lb 5.1 oz (82.7 kg)  BMI 29.44 kg/m2  SpO2 93% Physical Exam  Nursing note and vitals reviewed. Constitutional: She is oriented to person, place, and time. She appears well-developed.  Patient appears fatigued  HENT:  Head: Normocephalic and atraumatic.  Mouth/Throat: No oropharyngeal exudate.  Dry mucous membranes  Eyes: Conjunctivae and EOM are normal. Right eye exhibits no discharge. Left eye exhibits no discharge.  Sluggish PERRLA to left eye  Neck: Normal range of motion. Neck supple.  Cardiovascular: Regular rhythm and normal heart sounds.   Pulses:      Radial pulses are 2+ on the right side, and 2+ on the left side.       Dorsalis pedis pulses are 2+ on the right side.  Patient bradycardic Mild swelling localized to the right foot-negative pitting edema identified  Pulmonary/Chest: Effort normal and breath sounds normal. No respiratory distress. She has no wheezes. She has no rales. She exhibits tenderness (Tenderness upon palpation to the chest wall  bilaterally).  Abdominal: Soft. Bowel sounds are normal. There is tenderness. There is no guarding.  Tenderness upon palpation to the upper quadrants of the abdomen  Genitourinary:  Rectal exam: Negative swelling, erythema, inflammation, lesions, sores, hemorrhoids, bleeding, drainage from the anus. Sphincter tone strong. Negative blood on glove. Black tarry stools noted to glove.  Exam chaperoned.   Musculoskeletal: Normal range of motion.  BKA of left leg  Lymphadenopathy:    She has no cervical adenopathy.  Neurological: She is alert and  oriented to person, place, and time. She exhibits normal muscle tone. Coordination normal.    ED Course  Procedures (including critical care time)  9:32 AM Dr. Genene Churn in to see patient and assess. Recommended 1 Unit of blood. Dr. Genene Churn reported that he spoke with patient regarding transfusion and patient agreed.   9:53 AM This provider spoke with Dr. Rhea Belton, GI - discussed case, history, presentation, labs with physician who recommended that patient be reversed with FFP.   10:13 AM This provider spoke with Dr. Gwenlyn Perking, [facility] - discussed case, history, presentation, lab results. Patient to be admitted to the hospital for GI bleed, admitted to team 10 as inpatient to [facility]. Recommended 1 Unit of PRBC.   10:47 AM This provider and attending physician were called into the room due to patient's heart rate trending down into the 30's with a rhythm switching from junctional to sinus. This provider spoke with Intensivist Dr. Tyson Alias. Patient better on the Unit rather then on StepDown due to heart rate trending down. Dr. Tyson Alias to come and  see patient. This provider spoke with Dr. Gwenlyn Perking and reported that patient will be going to the ICU instead - Dr. Gwenlyn Perking agreed to plan of care.   11:17 AM This provider spoke with Dr. Arlean Hopping, Nephrologist, regarding case and how patient was due to get dialysis today at 7:30 AM. This provider discussed history, case, presentation, labs. Physician reported that he was seen and consult with the patient.   11:51 AM This provider spoke with Dr. Delton See, Cardiology -discussed case, presentation, labs, EKG-cardiology consultation.  Results for orders placed during the hospital encounter of 08/29/13  MRSA PCR SCREENING      Result Value Range   MRSA by PCR NEGATIVE  NEGATIVE  CBC WITH DIFFERENTIAL      Result Value Range   WBC 12.3 (*) 4.0 - 10.5 K/uL   RBC 2.31 (*) 3.87 - 5.11 MIL/uL   Hemoglobin 7.3 (*) 12.0 - 15.0 g/dL   HCT 16.1 (*) 09.6 -  46.0 %   MCV 94.8  78.0 - 100.0 fL   MCH 31.6  26.0 - 34.0 pg   MCHC 33.3  30.0 - 36.0 g/dL   RDW 04.5  40.9 - 81.1 %   Platelets 193  150 - 400 K/uL   Neutrophils Relative % 79 (*) 43 - 77 %   Neutro Abs 9.8 (*) 1.7 - 7.7 K/uL   Lymphocytes Relative 14  12 - 46 %   Lymphs Abs 1.8  0.7 - 4.0 K/uL   Monocytes Relative 4  3 - 12 %   Monocytes Absolute 0.5  0.1 - 1.0 K/uL   Eosinophils Relative 2  0 - 5 %   Eosinophils Absolute 0.2  0.0 - 0.7 K/uL   Basophils Relative 0  0 - 1 %   Basophils Absolute 0.0  0.0 - 0.1 K/uL  COMPREHENSIVE METABOLIC PANEL      Result Value Range   Sodium 138  135 - 145 mEq/L   Potassium 4.2  3.5 - 5.1 mEq/L   Chloride 91 (*) 96 - 112 mEq/L   CO2 28  19 - 32 mEq/L   Glucose, Bld 173 (*) 70 - 99 mg/dL   BUN 88 (*) 6 - 23 mg/dL   Creatinine, Ser 9.14 (*) 0.50 - 1.10 mg/dL   Calcium 9.9  8.4 - 78.2 mg/dL   Total Protein 6.7  6.0 - 8.3 g/dL   Albumin 3.2 (*) 3.5 - 5.2 g/dL   AST 19  0 - 37 U/L   ALT 17  0 - 35 U/L   Alkaline Phosphatase 64  39 - 117 U/L   Total Bilirubin 0.3  0.3 - 1.2 mg/dL   GFR calc non Af Amer 6 (*) >90 mL/min   GFR calc Af Amer 7 (*) >90 mL/min  PROTIME-INR      Result Value Range   Prothrombin Time 18.6 (*) 11.6 - 15.2 seconds   INR 1.60 (*) 0.00 - 1.49  APTT      Result Value Range   aPTT 35  24 - 37 seconds  TROPONIN I      Result Value Range   Troponin I <0.30  <0.30 ng/mL  TROPONIN I      Result Value Range   Troponin I <0.30  <0.30 ng/mL  LACTIC ACID, PLASMA      Result Value Range   Lactic Acid, Venous 2.4 (*) 0.5 - 2.2 mmol/L  CBC      Result Value Range   WBC 11.0 (*)  4.0 - 10.5 K/uL   RBC 2.03 (*) 3.87 - 5.11 MIL/uL   Hemoglobin 6.6 (*) 12.0 - 15.0 g/dL   HCT 16.1 (*) 09.6 - 04.5 %   MCV 93.1  78.0 - 100.0 fL   MCH 32.5  26.0 - 34.0 pg   MCHC 34.9  30.0 - 36.0 g/dL   RDW 40.9  81.1 - 91.4 %   Platelets 152  150 - 400 K/uL  PROTIME-INR      Result Value Range   Prothrombin Time 18.1 (*) 11.6 - 15.2  seconds   INR 1.54 (*) 0.00 - 1.49  BASIC METABOLIC PANEL      Result Value Range   Sodium 137  135 - 145 mEq/L   Potassium 4.5  3.5 - 5.1 mEq/L   Chloride 94 (*) 96 - 112 mEq/L   CO2 25  19 - 32 mEq/L   Glucose, Bld 162 (*) 70 - 99 mg/dL   BUN 95 (*) 6 - 23 mg/dL   Creatinine, Ser 7.82 (*) 0.50 - 1.10 mg/dL   Calcium 9.4  8.4 - 95.6 mg/dL   GFR calc non Af Amer 5 (*) >90 mL/min   GFR calc Af Amer 6 (*) >90 mL/min  TSH      Result Value Range   TSH 1.092  0.350 - 4.500 uIU/mL  TROPONIN I      Result Value Range   Troponin I <0.30  <0.30 ng/mL  TROPONIN I      Result Value Range   Troponin I <0.30  <0.30 ng/mL  CBC      Result Value Range   WBC 12.2 (*) 4.0 - 10.5 K/uL   RBC 2.46 (*) 3.87 - 5.11 MIL/uL   Hemoglobin 7.7 (*) 12.0 - 15.0 g/dL   HCT 21.3 (*) 08.6 - 57.8 %   MCV 92.7  78.0 - 100.0 fL   MCH 31.3  26.0 - 34.0 pg   MCHC 33.8  30.0 - 36.0 g/dL   RDW 46.9  62.9 - 52.8 %   Platelets 156  150 - 400 K/uL  CBC      Result Value Range   WBC 12.6 (*) 4.0 - 10.5 K/uL   RBC 2.50 (*) 3.87 - 5.11 MIL/uL   Hemoglobin 8.0 (*) 12.0 - 15.0 g/dL   HCT 41.3 (*) 24.4 - 01.0 %   MCV 92.8  78.0 - 100.0 fL   MCH 32.0  26.0 - 34.0 pg   MCHC 34.5  30.0 - 36.0 g/dL   RDW 27.2  53.6 - 64.4 %   Platelets 162  150 - 400 K/uL  TROPONIN I      Result Value Range   Troponin I 0.34 (*) <0.30 ng/mL  CBC      Result Value Range   WBC 11.8 (*) 4.0 - 10.5 K/uL   RBC 2.45 (*) 3.87 - 5.11 MIL/uL   Hemoglobin 7.7 (*) 12.0 - 15.0 g/dL   HCT 03.4 (*) 74.2 - 59.5 %   MCV 93.1  78.0 - 100.0 fL   MCH 31.4  26.0 - 34.0 pg   MCHC 33.8  30.0 - 36.0 g/dL   RDW 63.8  75.6 - 43.3 %   Platelets 157  150 - 400 K/uL  GLUCOSE, CAPILLARY      Result Value Range   Glucose-Capillary 144 (*) 70 - 99 mg/dL   Comment 1 Notify RN    GLUCOSE, CAPILLARY      Result Value Range  Glucose-Capillary 138 (*) 70 - 99 mg/dL  GLUCOSE, CAPILLARY      Result Value Range   Glucose-Capillary 165 (*) 70 - 99 mg/dL   GLUCOSE, CAPILLARY      Result Value Range   Glucose-Capillary 126 (*) 70 - 99 mg/dL  GLUCOSE, CAPILLARY      Result Value Range   Glucose-Capillary 166 (*) 70 - 99 mg/dL  GLUCOSE, CAPILLARY      Result Value Range   Glucose-Capillary 142 (*) 70 - 99 mg/dL  OCCULT BLOOD, POC DEVICE      Result Value Range   Fecal Occult Bld POSITIVE (*) NEGATIVE  TYPE AND SCREEN      Result Value Range   ABO/RH(D) O POS     Antibody Screen NEG     Sample Expiration 09/01/2013     Unit Number [id number]     Blood Component Type RBC LR PHER1     Unit division 00     Status of Unit REL FROM [facility]     Transfusion Status OK TO TRANSFUSE     Crossmatch Result Compatible     Unit Number [id number]     Blood Component Type RBC LR PHER2     Unit division 00     Status of Unit ISSUED,FINAL     Transfusion Status OK TO TRANSFUSE     Crossmatch Result Compatible    PREPARE FRESH FROZEN PLASMA      Result Value Range   Unit Number [id number]     Blood Component Type THAWED PLASMA     Unit division 00     Status of Unit ISSUED,FINAL     Transfusion Status OK TO TRANSFUSE    PREPARE RBC (CROSSMATCH)      Result Value Range   Order Confirmation ORDER PROCESSED BY BLOOD BANK    PREPARE FRESH FROZEN PLASMA      Result Value Range   Unit Number [id number]     Blood Component Type THAWED PLASMA     Unit division 00     Status of Unit ISSUED,FINAL     Transfusion Status OK TO TRANSFUSE       Labs Review Labs Reviewed  CBC WITH DIFFERENTIAL - Abnormal; Notable for the following:    WBC 12.3 (*)    RBC 2.31 (*)    Hemoglobin 7.3 (*)    HCT 21.9 (*)    Neutrophils Relative % 79 (*)    Neutro Abs 9.8 (*)    All other components within normal limits  COMPREHENSIVE METABOLIC PANEL - Abnormal; Notable for the following:    Chloride 91 (*)    Glucose, Bld 173 (*)    BUN 88 (*)    Creatinine, Ser 6.67 (*)    Albumin 3.2 (*)    GFR calc non Af Amer 6 (*)    GFR calc Af Amer 7 (*)     All other components within normal limits  PROTIME-INR - Abnormal; Notable for the following:    Prothrombin Time 18.6 (*)    INR 1.60 (*)    All other components within normal limits  LACTIC ACID, PLASMA - Abnormal; Notable for the following:    Lactic Acid, Venous 2.4 (*)    All other components within normal limits  CBC - Abnormal; Notable for the following:    WBC 11.0 (*)    RBC 2.03 (*)    Hemoglobin 6.6 (*)    HCT 18.9 (*)    All other  components within normal limits  PROTIME-INR - Abnormal; Notable for the following:    Prothrombin Time 18.1 (*)    INR 1.54 (*)    All other components within normal limits  BASIC METABOLIC PANEL - Abnormal; Notable for the following:    Chloride 94 (*)    Glucose, Bld 162 (*)    BUN 95 (*)    Creatinine, Ser 7.70 (*)    GFR calc non Af Amer 5 (*)    GFR calc Af Amer 6 (*)    All other components within normal limits  CBC - Abnormal; Notable for the following:    WBC 12.2 (*)    RBC 2.46 (*)    Hemoglobin 7.7 (*)    HCT 22.8 (*)    All other components within normal limits  CBC - Abnormal; Notable for the following:    WBC 12.6 (*)    RBC 2.50 (*)    Hemoglobin 8.0 (*)    HCT 23.2 (*)    All other components within normal limits  TROPONIN I - Abnormal; Notable for the following:    Troponin I 0.34 (*)    All other components within normal limits  CBC - Abnormal; Notable for the following:    WBC 11.8 (*)    RBC 2.45 (*)    Hemoglobin 7.7 (*)    HCT 22.8 (*)    All other components within normal limits  GLUCOSE, CAPILLARY - Abnormal; Notable for the following:    Glucose-Capillary 144 (*)    All other components within normal limits  GLUCOSE, CAPILLARY - Abnormal; Notable for the following:    Glucose-Capillary 138 (*)    All other components within normal limits  GLUCOSE, CAPILLARY - Abnormal; Notable for the following:    Glucose-Capillary 165 (*)    All other components within normal limits  GLUCOSE, CAPILLARY -  Abnormal; Notable for the following:    Glucose-Capillary 126 (*)    All other components within normal limits  GLUCOSE, CAPILLARY - Abnormal; Notable for the following:    Glucose-Capillary 166 (*)    All other components within normal limits  GLUCOSE, CAPILLARY - Abnormal; Notable for the following:    Glucose-Capillary 142 (*)    All other components within normal limits  OCCULT BLOOD, POC DEVICE - Abnormal; Notable for the following:    Fecal Occult Bld POSITIVE (*)    All other components within normal limits  MRSA PCR SCREENING  APTT  TROPONIN I  TROPONIN I  TSH  TROPONIN I  TROPONIN I  CBC  BASIC METABOLIC PANEL  TYPE AND SCREEN  PREPARE FRESH FROZEN PLASMA  PREPARE RBC (CROSSMATCH)  PREPARE FRESH FROZEN PLASMA   Imaging Review Dg Chest Port 1 View  08/29/2013   CLINICAL DATA:  Shortness of breath.  EXAM: PORTABLE CHEST - 1 VIEW  COMPARISON:  08/19/2013.  FINDINGS: Dialysis catheter again noted in stable position. Previously identified cardiomegaly and pulmonary vascular congestion remains. The pulmonary vascular congestion has improved from prior study. The previously identified interstitial prominence remains but has improved. No pleural effusion or pneumothorax.  IMPRESSION: Findings consistent with partial clearing of congestive heart failure with pulmonary interstitial edema.   Electronically Signed   By: Maisie Fus  Register   On: 08/29/2013 12:39    EKG Interpretation    Date/Time:    Ventricular Rate:    PR Interval:    QRS Duration:   QT Interval:    QTC Calculation:   R Axis:  Text Interpretation:             Date: 08/29/2013  Rate: 47  Rhythm: junctional rhythm  QRS Axis: normal  Intervals: normal  ST/T Wave abnormalities: normal  Conduction Disutrbances:none  Narrative Interpretation:   Old EKG Reviewed: changes noted EKG analyzed and reviewed by this provider and attending physician.   CRITICAL CARE Performed by: Raymon Mutton   Total critical care time: 30  Critical care time was exclusive of separately billable procedures and treating other patients.  Critical care was necessary to treat or prevent imminent or life-threatening deterioration.  Critical care was time spent personally by me on the following activities: development of treatment plan with patient and/or surrogate as well as nursing, discussions with consultants, evaluation of patient's response to treatment, examination of patient, obtaining history from patient or surrogate, ordering and performing treatments and interventions, ordering and review of laboratory studies, ordering and review of radiographic studies, pulse oximetry and re-evaluation of patient's condition.  MDM   1. GI bleed   2. ESRD (end stage renal disease)   3. Jugular vein thrombosis   4. Upper GI bleed   5. Anticoagulant long-term use   6. Bradycardia   7. CAD (coronary artery disease)   8. Coronary atherosclerosis of native coronary artery   9. Aortic stenosis   10. Aortic valve disorders   11. Anemia of chronic renal failure     Patient presenting to emergency department with coffee ground emesis that started this morning. Patient reports that she's been also experiencing black tarry stools the been ongoing since yesterday afternoon. Patient currently taking 5 mg of Coumadin daily and 0.8 mL of Lovenox daily. Patient was due to get dialysis at 7:30 AM this morning. Patient currently on anti-coagulation therapy for right jugular thrombosis.  This provider reviewed patient's chart. Patient was recently admitted to the hospital for syncopal episode on 08/13/2013 and was discharged on 08/20/2013. Patient presented with cardiac arrest during dialysis treatment on 08/14/2013 where CPR was performed and patient was resuscitated. Patient recently has been getting dialysis at least 4 times this past week, reported increased weakness. Alert and oriented. GCS 15. Heart rhythm  normal-bradycardia identified upon auscultation. Lungs good auscultation to upper and lower lobes. BKA to the left leg. Mild swelling localized to the right foot, mild pitting edema-negative pain upon palpation to the calf. DP 2+ to the right foot. Radial pulses 2+ bilaterally. Bowel sounds normal active in all 4 quadrants, mild discomfort upon palpation to the upper quadrants bilaterally and epigastric region. Rectal exam noted black tarry stools on glove, negative bright red blood. Strong sphincter tone. EKG noted bradycardia with a heart rate of 47, junctional rhythm identified, left bundle-branch. Negative elevation troponin. Cecal call positive. Pro time elevated at 18.6, INR elevated at 1.6. APTT 35. CBC noted mild elevated white blood cell count of 12.3 with negative left shift. Noted decrease in  hemoglobin from 10.0 to 7.3 and hematocrit from 29.1 to 21.9 - type and screen ordered and pending. CMP with significant increase in BUN from 54 to 88 and creatinine from 6.57 to 6.67. This provider spoke with GI who recommended FFP for reversal of anti-coagulation - GI to consult regarding GI bleed. This provider spoke with nephrology - since patient was due to get dialysis today at 7:30 AM this morning - nephrology to come and consult patient. Discussed case with Internal Medicine, patient to be admitted to [facility] floor. Patient continuously changing from sinus rhythm to junctional rhythm.  Patient continued to remain bradycardic with one incident of the patient's heart rate going into the 30's. Patient was easily aroused and reacted when name called. This provider called Intensivist - patient to be admitted to the ICU due to condition - GI bleed, dialysis patient, bradycardia, dips in heart rate and blood pressure. Consulted with cardiology regarding transitioning of rhythm - cardiology to consult. Patient understood and agreed to plan of admission.  Raymon Mutton, PA-C 08/30/13 1908  Raymon Mutton,  PA-C 08/30/13 1909

## 2013-08-29 NOTE — Consult Note (Signed)
Renal Service Consult Note [facility]  Briana Gould 08/29/2013 Briana Gould D Requesting Physician:  Dr Tyson Alias  Reason for Consult:  ESRD pt with acute GIB HPI: The patient is a 67 y.o. year-old with hx of DM, HTN and ESRD on hemodialysis admitted with hematemesis and melena.   Patient was admitted here 12/5-12/12/14 for LOC episodes at dialysis.  She had cardiac arrest on her first HD in the hospital. She was resuscitated in less than 5 minutes. Workup showed severe occlusive cerebrovascular disease non-operable, L > R, and she was felt to be at risk for cerebral hypoperfusion so she was discharged with plans to keep BP over 150 at outpatient HD.  She also had a cath-associated R IJ DVT and was d/c'd on coumadin and lovenox. She presents now with GIB. Hb is 6.6.  Hb was 12.0 on 11/25 and 9.0 on 12/17 by op HD labs.    Pt is alert no distress, no cp or sob, no ankle edema. Has L arm avf she says they aren't using it but are getting ready to, they are still using the catheter.    She is on 5 bp meds at home   ROS  no confusion  no abd pain, n/v/d  no sob or cough  Past Medical History  Past Medical History  Diagnosis Date  . Hypertension   . PAD (peripheral artery disease)   . Type II diabetes mellitus   . Anemia   . History of blood transfusion 2002    "related to amputation" (08/13/2013)  . End stage renal disease on dialysis     "[facility]; M, W, F" (08/13/2013)  . Cardiac arrest 08/14/13   Past Surgical History  Past Surgical History  Procedure Laterality Date  . Av fistula placement Left 2011    upper arm  . Below knee leg amputation Left 2002  . Tonsillectomy    . Cataract extraction w/ intraocular lens  implant, bilateral Bilateral 2012-2014   Family History  Family History  Problem Relation Age of Onset  . Hypertension Mother   . Hypertension Father    Social History  reports that she has never smoked. She has never  used smokeless tobacco. She reports that she does not drink alcohol or use illicit drugs. Allergies  Allergies  Allergen Reactions  . Phenergan [Promethazine Hcl] Other (See Comments)    Causes patient to" vomit more"  . Vitamin D Analogs Rash   Home medications Prior to Admission medications   Medication Sig Start Date End Date Taking? Authorizing Provider  amLODipine (NORVASC) 10 MG tablet Take 10 mg by mouth daily.   Yes Historical Provider, MD  b complex-vitamin c-folic acid (NEPHRO-VITE) 0.8 MG TABS tablet Take 1 tablet by mouth daily.   Yes Historical Provider, MD  doxazosin (CARDURA) 8 MG tablet Take 8 mg by mouth at bedtime.   Yes Historical Provider, MD  enoxaparin (LOVENOX) 80 MG/0.8ML injection Inject 0.8 mLs (80 mg total) into the skin daily. 08/20/13  Yes Kathlen Mody, MD  hydrALAZINE (APRESOLINE) 25 MG tablet Take 2 tablets (50 mg total) by mouth 3 (three) times daily. 11/08/11  Yes Nishant Dhungel, MD  ketorolac (TORADOL) 10 MG tablet Take 10 mg by mouth every 6 (six) hours as needed for moderate pain.   Yes Historical Provider, MD  labetalol (NORMODYNE) 200 MG tablet Take 200 mg by mouth 2 (two) times daily. 10/01/11 08/29/13 Yes Luis Abed, MD  lisinopril (PRINIVIL,ZESTRIL) 40 MG tablet Take 40  mg by mouth daily.   Yes Historical Provider, MD  Nutritional Supplements (FEEDING SUPPLEMENT, NEPRO CARB STEADY,) LIQD Take 237 mLs by mouth daily. 08/20/13  Yes Kathlen Mody, MD  sevelamer carbonate (RENVELA) 800 MG tablet Take 800 mg by mouth 3 (three) times daily with meals.   Yes Historical Provider, MD  warfarin (COUMADIN) 5 MG tablet Take 1 tablet (5 mg total) by mouth daily. 08/20/13  Yes Kathlen Mody, MD   Liver Function Tests  Recent Labs Lab 08/29/13 0742  AST 19  ALT 17  ALKPHOS 64  BILITOT 0.3  PROT 6.7  ALBUMIN 3.2*   No results found for this basename: LIPASE, AMYLASE,  in the last 168 hours CBC  Recent Labs Lab 08/29/13 0742 08/29/13 1428  WBC 12.3*  11.0*  NEUTROABS 9.8*  --   HGB 7.3* 6.6*  HCT 21.9* 18.9*  MCV 94.8 93.1  PLT 193 152   Basic Metabolic Panel  Recent Labs Lab 08/29/13 0742  NA 138  K 4.2  CL 91*  CO2 28  GLUCOSE 173*  BUN 88*  CREATININE 6.67*  CALCIUM 9.9    Exam  Blood pressure 113/39, pulse 48, temperature 98.5 F (36.9 C), temperature source Oral, resp. rate 13, height 5\' 6"  (1.676 m), weight 80.6 kg (177 lb 11.1 oz), SpO2 98.00%.  gen: alert, HOH, NG tube in, no distress, lying at 30 deg  skin: no rash, cyanosis  heent: eomi, sclera anicteric, throat clear  neck: no jvd, neck veins are flat, no bruits  chest: clear to bases bilat, good air movement  cor: regular, harsh 2/6 SEM  abd: soft, mild tenderness R side, +BS, obese, no ascites  ext: trace edema R ankle, L BKA stump no edema, no joint effusion, no gangrene/ulcers  neuro: alert, ox3, nonfocal  access: LUA AVF patent, R IJ cath intact   Dialysis: MWF [place] 3:45hrs  F180  81kg  2K/2.25 bath  Prof 2  Heparin none  R IJ cath-active   LUA AVF-maturing Hectorol 8     Epo 3000 Last labs: Hb 9.0 tsat 36% ferritin 1037 calc 10.0 phos 4.2  pth 455   Assessment/Plan: 1. Upper GI bleed: pt on coumadin for R IJ cath assoc DVT; getting prbc's, Hb was 6.6.  She is not vol overloaded and has room for blood products at this time. +nsaids' on home med list (toradol). BP is on the low side, will plan HD tomorrow, hopefully BP will be up a little bit more 2. Recent cardiac arrest / cerebrovascular disease: try to keep BP over 140 on dialysis 3. ESRD: HD Monday 4. Anemia / CKD + GIB: as above, cont esa with darbe 100/wk first dose today  5. MBD / CKD: cont vit D, binders 6. HTN/volume: on 5 BP meds, all on hold. BP 120/80 range; no vol excess, just below dry wt 7. DM2 8. PAD / L BKA 9. 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 Pulm HTN 11. Aortic stenosis   Vinson Moselle MD  pager [phone]    cell [phone]  08/29/2013, 3:30 PM   Vinson Moselle  MD (pgr) [phone]    [phone] 08/29/2013, 3:30 PM

## 2013-08-29 NOTE — Consult Note (Signed)
Cardiology Consultation Note  [name], MRN: [id number], DOB/AGE: Sep 09, 1946 67 y.o. Admit date: 08/29/2013   Date of Consult: 08/29/2013 Primary Physician: [name], MD Primary Cardiologist: Myrtis Ser  Chief Complaint: nausea, weakness, black stool and coffee ground vomit Reason for Consult: junctional bradycardia  HPI: Briana Gould is a 67 y.o. female with PMH of diffuse CAD by cath 2011 not amenable to PCI/CABG, ESRD on HD, PAD, pulm HTN, and recent admission 08/2013 for a possible cardiac arrest with subsequent diagnoses of R IJ DVT, possible CVA by MRI, cerebrovascular disease and carotid artery disease. The cardiac arrest was felt possibly related to cerebral hypoperfusion in the setting of hypotension during dialysis. She had brief CPR - rhythm was unclear; she reportedly was was fully intact after episode and did not require intubation. She was started on Lovenox to Coumadin bridge for her DVT. She returned to [facility] today with weakness, nausea, coffee-ground emesis, melena, and acute anemia with Hgb down to 6.6. She's had some intermittent RLQ pain. INR 1.6, WBC 12k, potassium today was 4.2. CXR showed findings consistent with partial clearing of congestive heart failure with pulmonary interstitial edema. Initial ED EKG revealed the patient to be in junctional bradycardia with a HR in the 40's. BP has remained stable and she is not hypoxic. She is begin treated with FFP, pRBC, and PPI gtt with plan for eventual EGD. Anticoagulation is being held.  Initially when I tried to interview the patient she was very quiet and did not participate in the conversation. She stated she was going to throw up and proceeded to do so in a basin. HR temporary dropped to the 30's but came right back up to the 50's. She was mentally alert and conversant after that. BP remained stable. She does endorse some intermittent CP after her last admission which she attributed to CPR, but denies  having any for the last few days. Denies current CP, SOB, dizziness or lightheadedness.  Past Medical History  Diagnosis Date  . Hypertension   . PAD (peripheral artery disease)   . Type II diabetes mellitus   . Anemia   . History of blood transfusion 2002    "related to amputation" (08/13/2013)  . End stage renal disease on dialysis     "[facility]; M, W, F" (08/13/2013)  . Cardiac arrest 08/14/13    a. Possible arrest in setting of hypotension during dialysis with possible arrest requiring brief CPR, not intubated and was fully intact after episode. Suspected due to low cerebral perfusion in setting of hypotension. Unclear if any arrhythmia.  Marland Kitchen DVT (deep vein thrombosis) in pregnancy     a. R IJ DVT 08/2013.  Marland Kitchen CVA (cerebral infarction)     a. MRI 08/2013: possible left motor strip infarct with remote pontine infarct.  . Pulmonary HTN     a. Cath 2011 - mod to severe pulmonary hypertension. b. Echo 08/2013: PAP (mild-mod increased).  . Thrombocytopenia   . CAD (coronary artery disease)     a. Severe diffuse diabetic disease by cath 2011, for med rx, not a candidate for CABG or PCI at that time.  . Aortic stenosis   . Syncope     a. 2011, etiology never clear.  . Carotid artery disease     a. Dopp 40/9811: 1-39% LICA< 60-79% RICA borderline >80%.  . Cerebrovascular disease     a. 08/2013 MRA brain: multiple bilateral moderate and high grade intracranial stenoses  Most Recent Cardiac Studies: Cardiac Cath 08/2010 PATIENT IDENTIFICATION:  Briana Gould is a 67 year old woman with a long history of diabetes and hypertension.  She now has end-stage renal disease and is on dialysis.  She was admitted with syncope. Echocardiogram revealed normal LV function with at least moderate mitral regurgitation and severe pulmonary hypertension.  She is referred for right and left heart catheterization. PROCEDURES PERFORMED: 1. Right heart cath. 2. Selective  coronary angiography. 3. Left heart cath. 4. Left ventriculogram. DESCRIPTION OF PROCEDURE:  The risks and indication were explained. Consent was signed and placed on the chart.  A 5-French arterial sheath was placed on the right femoral artery using a modified Seldinger technique.  Standard catheters including a JL-4, JR-4 and angled pigtail were used for the procedure.  All catheter exchanges made over wire. There are no apparent complications.  A 7-French venous sheath was placed on the right femoral vein using a modified Seldinger technique. A standard Swan-Ganz catheter was used.  Once again, no apparent complications. HEMODYNAMIC DATA:  Central aortic pressure 180/88 with a mean of 105. The LV pressure of 183/15 with an EDP of 27.  Right atrial pressure with a mean of 13.  RV pressure 74/11 with an EDP of 15.  PA pressure 79/27 with a mean of 50.  Pulmonary capillary wedge pressure with a mean of 30 with V-waves to 45.  Fick cardiac output 4.9.  Fick cardiac index 3.3. Pulmonary vascular resistance was 4.0 woods units. Left main was normal. LAD is a long vessel coursing to the apex gave off two diagonals.  The entire vessel had diffuse diabetic vasculopathy with approximately 40- 50% stenosis throughout.  In the midportion of the LAD, there was a focal 60% lesion in the distal apical LAD.  There was a 60-70% focal lesion. Left circumflex was a large codominant vessel, gave off two marginal branches, two posterolaterals and a PDA.  It had a diffuse 40-50% stenosis throughout the vessel consistent with diabetic vasculopathy. The right coronary artery was small to moderate size codominant vessel, gave off a PDA branch.  There was diffuse 40% stenosis throughout the proximal and midportion.  The distal RCA leading into a small PDA. There is a tubular 99% stenosis. Left ventriculogram done in the RAO position showed an EF of 55-60% with no regional wall motion abnormalities.  There  was only trace mitral regurgitation visualized on panning down over the abdominal aorta after the V-gram.  There was no evidence of abdominal aortic aneurysm. ASSESSMENT: 1. Three-vessel coronary artery disease with diffuse diabetic     vasculopathy. 2. Normal left ventricular function. 3. Markedly increased left-sided filling pressures and elevated V-     waves suggestive of severe mitral regurgitation. 4. Moderate-to-severe pulmonary hypertension. DISCUSSION:  Briana Gould has diffuse coronary artery disease in the pattern of diabetic radiculopathy which is not amenable to revascularization either surgically or percutaneously.  There is also moderate-to-severe pulmonary hypertension which is mostly left-sided. This is in the setting of probable moderate-to-severe mitral regurgitation. RECOMMENDATIONS: 1. Aggressive control of blood pressure with afterload reducing agents     such as hydralazine and nitroglycerin. 2. Attempt to dialyze dry weight down as tolerated. 3. Consider transesophageal echocardiogram to better evaluate mitral     regurgitation.  Once that her blood pressure and dry weight are     better controlled.    2D Echo 08/15/13 - Left ventricle: The cavity size was moderately reduced. Wall thickness was increased in a pattern of moderate  LVH. Features are consistent with a pseudonormal left ventricular filling pattern, with concomitant abnormal relaxation and increased filling pressure (grade 2 diastolic dysfunction). - Aortic valve: Mildly to moderately calcified annulus. Moderately thickened, moderately calcified leaflets. There was mild stenosis. Valve area: 1.37cm^2(VTI). Valve area: 1.26cm^2 (Vmax). - Left atrium: The atrium was mildly dilated. - Right atrium: The atrium was mildly dilated. - Pulmonary arteries: Systolic pressure was mildly to moderately increased. PA peak pressure: 45mm Hg (S).   Surgical History:  Past Surgical History   Procedure Laterality Date  . Av fistula placement Left 2011    upper arm  . Below knee leg amputation Left 2002  . Tonsillectomy    . Cataract extraction w/ intraocular lens  implant, bilateral Bilateral 2012-2014     Home Meds: Prior to Admission medications   Medication Sig Start Date End Date Taking? Authorizing Provider  amLODipine (NORVASC) 10 MG tablet Take 10 mg by mouth daily.   Yes Historical Provider, MD  b complex-vitamin c-folic acid (NEPHRO-VITE) 0.8 MG TABS tablet Take 1 tablet by mouth daily.   Yes Historical Provider, MD  doxazosin (CARDURA) 8 MG tablet Take 8 mg by mouth at bedtime.   Yes Historical Provider, MD  enoxaparin (LOVENOX) 80 MG/0.8ML injection Inject 0.8 mLs (80 mg total) into the skin daily. 08/20/13  Yes Kathlen Mody, MD  hydrALAZINE (APRESOLINE) 25 MG tablet Take 2 tablets (50 mg total) by mouth 3 (three) times daily. 11/08/11  Yes Nishant Dhungel, MD  ketorolac (TORADOL) 10 MG tablet Take 10 mg by mouth every 6 (six) hours as needed for moderate pain.   Yes Historical Provider, MD  labetalol (NORMODYNE) 200 MG tablet Take 200 mg by mouth 2 (two) times daily. 10/01/11 08/29/13 Yes Luis Abed, MD  lisinopril (PRINIVIL,ZESTRIL) 40 MG tablet Take 40 mg by mouth daily.   Yes Historical Provider, MD  Nutritional Supplements (FEEDING SUPPLEMENT, NEPRO CARB STEADY,) LIQD Take 237 mLs by mouth daily. 08/20/13  Yes Kathlen Mody, MD  sevelamer carbonate (RENVELA) 800 MG tablet Take 800 mg by mouth 3 (three) times daily with meals.   Yes Historical Provider, MD  warfarin (COUMADIN) 5 MG tablet Take 1 tablet (5 mg total) by mouth daily. 08/20/13  Yes Kathlen Mody, MD    Inpatient Medications:  . atropine      . ondansetron      . [START ON 09/02/2013] pantoprazole (PROTONIX) IV  40 mg Intravenous Q12H  . pantoprazole (PROTONIX) IV  40 mg Intravenous Once   . pantoprozole (PROTONIX) infusion      Allergies:  Allergies  Allergen Reactions  . Phenergan  [Promethazine Hcl] Other (See Comments)    Causes patient to" vomit more"  . Vitamin D Analogs Rash    History   Social History  . Marital Status: Married    Spouse Name: N/A    Number of Children: N/A  . Years of Education: N/A   Occupational History  . Not on file.   Social History Main Topics  . Smoking status: Never Smoker   . Smokeless tobacco: Never Used  . Alcohol Use: No  . Drug Use: No  . Sexual Activity: Not Currently   Other Topics Concern  . Not on file   Social History Narrative  . No narrative on file     Family History  Problem Relation Age of Onset  . Hypertension Mother   . Hypertension Father      Review of Systems: All other systems reviewed and  are otherwise negative except as noted above.  Labs:  Recent Labs  08/29/13 0813 08/29/13 1408  TROPONINI <0.30 <0.30   Lab Results  Component Value Date   WBC 11.0* 08/29/2013   HGB 6.6* 08/29/2013   HCT 18.9* 08/29/2013   MCV 93.1 08/29/2013   PLT 152 08/29/2013    Recent Labs Lab 08/29/13 0742  NA 138  K 4.2  CL 91*  CO2 28  BUN 88*  CREATININE 6.67*  CALCIUM 9.9  PROT 6.7  BILITOT 0.3  ALKPHOS 64  ALT 17  AST 19  GLUCOSE 173*   Radiology/Studies:  08/29/2013   CLINICAL DATA:  Shortness of breath.  EXAM: PORTABLE CHEST - 1 VIEW  COMPARISON:  08/19/2013.  FINDINGS: Dialysis catheter again noted in stable position. Previously identified cardiomegaly and pulmonary vascular congestion remains. The pulmonary vascular congestion has improved from prior study. The previously identified interstitial prominence remains but has improved. No pleural effusion or pneumothorax.  IMPRESSION: Findings consistent with partial clearing of congestive heart failure with pulmonary interstitial edema.   Electronically Signed   By: Maisie Fus  Register   On: 08/29/2013 12:39   EKG: junctional bradycardia 43bpm LBBB   Physical Exam: Blood pressure 118/37, pulse 49, temperature 98.6 F (37 C),  temperature source Oral, resp. rate 15, height 5\' 6"  (1.676 m), weight 177 lb 11.1 oz (80.6 kg), SpO2 97.00%. General: Chronically ill app AAF in no acute distress. Head: Normocephalic, atraumatic, sclera non-icteric, no xanthomas, nares are without discharge.  Neck: JVD not elevated. Lungs: Clear bilaterally to auscultation without wheezes, rales, or rhonchi. Breathing is unlabored. Heart: RRR with S1 S2. 2/6 SEM No rubs or gallops appreciated. Abdomen: Soft, non-tender, non-distended with normoactive bowel sounds. No hepatomegaly. No rebound/guarding. No obvious abdominal masses. Msk:  Strength and tone appear normal for age. Extremities: No clubbing or cyanosis. No edema.  S/p L BKA. Distal pedal pulses are 2+ and equal bilaterally. Neuro: Alert and oriented X 3 now. No facial asymmetry. No focal deficit. Moves all extremities spontaneously. Psych:  Responds to questions appropriately with a normal affect.   Assessment and Plan:   1. Acute GIB with ABL anemia 2. Recent R IJ DVT 3. Possible CVA 08/2013 last admission with ? cardiac arrest at that time (possible hypoperfusion in setting of hypotension with HD) 4. Junctional bradycardia 5. ESRD on HD 6. Known significant CAD 2011 unamenable to PCI or CABG 7. Mild AS by echo 08/2013  D/w Dr. Delton See. Keep pacer pads on patient at bedside, but no acute indication for pacing. DC labetalol. Hold other antihypertensives to have room for BP. Atropine is also at bedside. Send TSH to rule out obvious reversible cause of bradycardia. Monitor closely.  Signed, Ronie Spies PA-C 08/29/2013, 4:49 PM  The patient was seen, examined and discussed with Ronie Spies, PA-C and I agree with the above.   In summary, Briana Gould is a 67 y.o. female with PMH of diffuse CAD by cath 2011 not amenable to PCI/CABG, ESRD on HD, PAD, pulm HTN, and recent admission 08/2013 for a possible cardiac arrest with subsequent diagnoses of R IJ DVT, possible CVA by  MRI. She is now admitted with acute GI bleed (coffe ground emesis, melena) and we were consulted for sinus and junctional bradycardia down to 40'. Electrolytes are normal. She has been on Labetalol that is held. She had 1 witnessed episode of bradycardia at 40 BPM and concomittant nausea. It is questionable if nausea was caused by bradycardia as the patient was  admitted with nausea and coffee ground emesis.  The first troponin is negative.  For now we will monitor, hold labetalol, apply transcutaneous pacing pads as a back up.   Tobias Alexander, Rexene Edison 08/29/2013

## 2013-08-29 NOTE — Progress Notes (Signed)
CRITICAL VALUE ALERT  Critical value received:  Hgb 6.6  Date of notification:  08/29/2013  Time of notification: 1430  Critical value read back:yes  Nurse who received alert:  Audree Bane, RN  MD notified (1st page): Dr. Tyson Alias  Time of first page:  1432  MD notified (2nd page):  Time of second page:  Responding MD:  Dr. Tyson Alias  Time MD responded:  [phone]

## 2013-08-29 NOTE — ED Provider Notes (Signed)
Patient was seen and evaluated. I discussed the case with PA Sciacca. I discussed the need for transfusion and reversal of her anticoagulation with her. All her questions were answered. She is total signed a consent form. The case been discussed between PA and hospitalist and GI. She is on a Protonix drip. Plan will be admission for endoscopy.  Roney Marion, MD 08/29/13 1021

## 2013-08-29 NOTE — Consult Note (Signed)
Consult Note for Dr. [facility]  Primary Care Physician:  [name], MD Primary Gastroenterologist:  none  HPI: Aleighna Blok is a 67 y.o. female with PMH of ESRD on HD Monday Wednesday Friday, recent right internal jugular vein DVT recently started on warfarin with Lovenox bridge, diabetes, peripheral artery disease, hypertension, and recent hospitalization for hypotension occurring during dialysis and a transient cardiac arrest briefly required CPR.  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  She was discharged to home where she has been feeling somewhat weak but overall she felt she was improving.  Yesterday she developed nausea with coffee ground) colored emesis on 3 occasions.  She also noticed 2 episodes of formed black stool. She denies epigastric abdominal pain. No significant heartburn, dysphagia or odynophagia. She does feel soreness in her right lower quadrant but this comes and goes and has been there several weeks.  She also reports chest discomfort since discharge from the hospital. She was told this may be secondary to chest compressions received during her arrest.   She denies previous endoscopy or colonoscopy.  Her dialysis days are Monday Wednesday and Friday and she did complete dialysis Friday  Her last Lovenox injection was Friday, and she states she ran out of this medicine but has been on warfarin  In the ER she was noted to have bradycardia and at times junctional rhythm with heart rates in the 30s. She remained normotensive. Critical care medicine was contacted and is planning admission to the ICU.  FFP has been ordered with the first unit hanging.  PRBCs have been ordered but not yet given.   Past Medical History  Diagnosis Date  . Hypertension   . PAD (peripheral artery disease)   . Type II diabetes  mellitus   . Anemia   . History of blood transfusion 2002    "related to amputation" (08/13/2013)  . End stage renal disease on dialysis     "[facility]; M, W, F" (08/13/2013)  . Cardiac arrest 08/14/13    Past Surgical History  Procedure Laterality Date  . Av fistula placement Left 2011    upper arm  . Below knee leg amputation Left 2002  . Tonsillectomy    . Cataract extraction w/ intraocular lens  implant, bilateral Bilateral 2012-2014    Prior to Admission medications   Medication Sig Start Date End Date Taking? Authorizing Provider  amLODipine (NORVASC) 10 MG tablet Take 10 mg by mouth daily.   Yes Historical Provider, MD  b complex-vitamin c-folic acid (NEPHRO-VITE) 0.8 MG TABS tablet Take 1 tablet by mouth daily.   Yes Historical Provider, MD  doxazosin (CARDURA) 8 MG tablet Take 8 mg by mouth at bedtime.   Yes Historical Provider, MD  enoxaparin (LOVENOX) 80 MG/0.8ML injection Inject 0.8 mLs (80 mg total) into the skin daily. 08/20/13  Yes Kathlen Mody, MD  hydrALAZINE (APRESOLINE) 25 MG tablet Take 2 tablets (50 mg total) by mouth 3 (three) times daily. 11/08/11  Yes Nishant Dhungel, MD  ketorolac (TORADOL) 10 MG tablet Take 10 mg by mouth every 6 (six) hours as needed for moderate pain.   Yes Historical Provider, MD  labetalol (NORMODYNE) 200 MG tablet Take 200 mg by mouth 2 (two) times daily. 10/01/11 08/29/13 Yes Luis Abed, MD  lisinopril (PRINIVIL,ZESTRIL) 40 MG tablet Take 40 mg by mouth daily.  Yes Historical Provider, MD  Nutritional Supplements (FEEDING SUPPLEMENT, NEPRO CARB STEADY,) LIQD Take 237 mLs by mouth daily. 08/20/13  Yes Kathlen Mody, MD  sevelamer carbonate (RENVELA) 800 MG tablet Take 800 mg by mouth 3 (three) times daily with meals.   Yes Historical Provider, MD  warfarin (COUMADIN) 5 MG tablet Take 1 tablet (5 mg total) by mouth daily. 08/20/13  Yes Kathlen Mody, MD    Current Facility-Administered Medications  Medication Dose Route  Frequency Provider Last Rate Last Dose  . ondansetron (ZOFRAN) 4 MG/2ML injection            Current Outpatient Prescriptions  Medication Sig Dispense Refill  . amLODipine (NORVASC) 10 MG tablet Take 10 mg by mouth daily.      Marland Kitchen b complex-vitamin c-folic acid (NEPHRO-VITE) 0.8 MG TABS tablet Take 1 tablet by mouth daily.      Marland Kitchen doxazosin (CARDURA) 8 MG tablet Take 8 mg by mouth at bedtime.      . enoxaparin (LOVENOX) 80 MG/0.8ML injection Inject 0.8 mLs (80 mg total) into the skin daily.  5 Syringe  0  . hydrALAZINE (APRESOLINE) 25 MG tablet Take 2 tablets (50 mg total) by mouth 3 (three) times daily.  90 tablet  3  . ketorolac (TORADOL) 10 MG tablet Take 10 mg by mouth every 6 (six) hours as needed for moderate pain.      Marland Kitchen labetalol (NORMODYNE) 200 MG tablet Take 200 mg by mouth 2 (two) times daily.      Marland Kitchen lisinopril (PRINIVIL,ZESTRIL) 40 MG tablet Take 40 mg by mouth daily.      . Nutritional Supplements (FEEDING SUPPLEMENT, NEPRO CARB STEADY,) LIQD Take 237 mLs by mouth daily.    0  . sevelamer carbonate (RENVELA) 800 MG tablet Take 800 mg by mouth 3 (three) times daily with meals.      . warfarin (COUMADIN) 5 MG tablet Take 1 tablet (5 mg total) by mouth daily.  15 tablet  0    Allergies as of 08/29/2013 - Review Complete 08/29/2013  Allergen Reaction Noted  . Phenergan [promethazine hcl] Other (See Comments) 08/13/2013  . Vitamin d analogs Rash 08/13/2013    Family History  Problem Relation Age of Onset  . Hypertension Mother   . Hypertension Father     History   Social History  . Marital Status: Married    Spouse Name: N/A    Number of Children: N/A  . Years of Education: N/A   Occupational History  . Not on file.   Social History Main Topics  . Smoking status: Never Smoker   . Smokeless tobacco: Never Used  . Alcohol Use: No  . Drug Use: No  . Sexual Activity: Not Currently   Other Topics Concern  . Not on file   Social History Narrative  . No narrative on  file    Review of Systems: All systems reviewed an negative except where noted in HPI.  Physical Exam: Vital signs in last 24 hours: Temp:  [98.2 F (36.8 C)-98.6 F (37 C)] 98.6 F (37 C) (12/21 1100) Pulse Rate:  [41-61] 48 (12/21 1130) Resp:  [0-20] 19 (12/21 1130) BP: (112-145)/(28-97) 145/45 mmHg (12/21 1100) SpO2:  [89 %-100 %] 100 % (12/21 1130)   Gen: awake, alert, chronically ill-appearing female in no acute distress HEENT: anicteric, op clear CV: Bradycardic, regular Pulm: CTA b/l Abd: soft, without overt tenderness, nondistended, +BS throughout Ext: no c/c/e Neuro: nonfocal Rectal: Performed within the last hour  by ER staff, reported no masses with black heme-positive stool (not repeated by me)   Lab Results:  Recent Labs  08/29/13 0742  WBC 12.3*  HGB 7.3*  HCT 21.9*  PLT 193   BMET  Recent Labs  08/29/13 0742  NA 138  K 4.2  CL 91*  CO2 28  GLUCOSE 173*  BUN 88*  CREATININE 6.67*  CALCIUM 9.9   LFT  Recent Labs  08/29/13 0742  PROT 6.7  ALBUMIN 3.2*  AST 19  ALT 17  ALKPHOS 64  BILITOT 0.3   PT/INR  Recent Labs  08/29/13 0742  LABPROT 18.6*  INR 1.60*    Impression / Plan:  67 y.o. female with PMH of ESRD on HD Monday Wednesday Friday, recent right internal jugular vein DVT recently started on warfarin with Lovenox bridge, diabetes, peripheral artery disease, hypertension, and recent hospitalization for hypotension occurring during dialysis and a transient cardiac arrest briefly required CPR presenting today with coffee-ground emesis and melena with drop in hemoglobin. Presentation today complicated by symptomatic bradycardia  1.  UGI bleeding -- in the setting of warfarin.  INR is 1.6, FFP x2 has been ordered and the first unit is hanging now.  Also plan for 2 units PRBCs.  I recommended endoscopy, but would like to wait until she is in the ICU in stable from a cardiac standpoint. The procedure was discussed, including the risks  and benefits and she is agreeable to proceed, but understands that I would like to wait until she receives FFP, PRBC, and her cardiac status improves. --PPI gtt --FFP and pRBC --2 large bore IVs --ICU monitoring, CCM vs. Cardiology input for bradycardia --serial Hgbs --EGD when more stable (blood products, improvement/stablization of bradycardia)  2.  Bradycardia -- per CCM  3.  RIJ DVT -- warfarin on hold given bleeding  4.  ESRD -- per renal     LOS: 0 days   Irfan Veal M  08/29/2013, 11:34 AM

## 2013-08-29 NOTE — H&P (Signed)
Name: Briana Gould MRN: [id number] DOB: [birth year]    ADMISSION DATE:  08/29/2013  REFERRING MD :  EDP  PRIMARY SERVICE: PCCM  CHIEF COMPLAINT:  GIB /Hypotension   BRIEF PATIENT DESCRIPTION: 67 yo with ESRD on HD recent admit 12/4 with hypotension during HD found to have R. IJ thrombus started on coumadin at discharge.  To ER on 12/21 with bloody diarrhea/vomit  found to be anemic with hgb 7.3.  PCCM to admit to ICU   SIGNIFICANT EVENTS / STUDIES:  12/20- brady, BP wnl  LINES / TUBES:   CULTURES:   ANTIBIOTICS:   HISTORY OF PRESENT ILLNESS:  67 yo with ESRD on HD recently discharged 08/20/13 for  hypotension during HD. Transferred to ICU 12/06 after recurrent hypotension during HD with possible cardiac arrest requiring brief CPR.   She was found to have right IJ thrombus. MRI of the brain done and showed tiny area of altered signal intensity left motor strip. Neurology consulted And recommended anticoagulation with IV heparin and coumadin. Discharged on Lovenox/coumadin bridge . Unclear what INR has been running since discharge . On arrival today , INR 1.6.  Pt says she has had increased weakness x 24hr . Developed dark-black tarry stools last pm with hematemesis this am . Coffee grounds material noted  On arrival to ER , hbg down from 10.0 to 7.3 . Hemocult stool positive .  Pt started on FFP . And Protonix drip . GI consulted for plans for endoccopy.  PCCM asked to admit to ICU. Pt b/p 120 systolic . HR have been labile in 40-50s.  O2 sat nml on RA. On Labetalol. At home . Pt alert and oriented in ER bed.  No chest pain, or syncope. Denies NSAID use. Given IVF bolus for low b/p  No missed HD visits. Last HD was on 12/19 . 1 missed coumadin dose last week .  Denies etoh use.    PAST MEDICAL HISTORY :  Past Medical History  Diagnosis Date  . Hypertension   . PAD (peripheral artery disease)   . Type II diabetes mellitus   . Anemia   . History of blood transfusion  2002    "related to amputation" (08/13/2013)  . End stage renal disease on dialysis     "[facility]; M, W, F" (08/13/2013)  . Cardiac arrest 08/14/13   Past Surgical History  Procedure Laterality Date  . Av fistula placement Left 2011    upper arm  . Below knee leg amputation Left 2002  . Tonsillectomy    . Cataract extraction w/ intraocular lens  implant, bilateral Bilateral 2012-2014   Prior to Admission medications   Medication Sig Start Date End Date Taking? Authorizing Provider  amLODipine (NORVASC) 10 MG tablet Take 10 mg by mouth daily.   Yes Historical Provider, MD  b complex-vitamin c-folic acid (NEPHRO-VITE) 0.8 MG TABS tablet Take 1 tablet by mouth daily.   Yes Historical Provider, MD  doxazosin (CARDURA) 8 MG tablet Take 8 mg by mouth at bedtime.   Yes Historical Provider, MD  enoxaparin (LOVENOX) 80 MG/0.8ML injection Inject 0.8 mLs (80 mg total) into the skin daily. 08/20/13  Yes Kathlen Mody, MD  hydrALAZINE (APRESOLINE) 25 MG tablet Take 2 tablets (50 mg total) by mouth 3 (three) times daily. 11/08/11  Yes Nishant Dhungel, MD  ketorolac (TORADOL) 10 MG tablet Take 10 mg by mouth every 6 (six) hours as needed for moderate pain.   Yes Historical Provider, MD  labetalol (  NORMODYNE) 200 MG tablet Take 200 mg by mouth 2 (two) times daily. 10/01/11 08/29/13 Yes Luis Abed, MD  lisinopril (PRINIVIL,ZESTRIL) 40 MG tablet Take 40 mg by mouth daily.   Yes Historical Provider, MD  Nutritional Supplements (FEEDING SUPPLEMENT, NEPRO CARB STEADY,) LIQD Take 237 mLs by mouth daily. 08/20/13  Yes Kathlen Mody, MD  sevelamer carbonate (RENVELA) 800 MG tablet Take 800 mg by mouth 3 (three) times daily with meals.   Yes Historical Provider, MD  warfarin (COUMADIN) 5 MG tablet Take 1 tablet (5 mg total) by mouth daily. 08/20/13  Yes Kathlen Mody, MD   Allergies  Allergen Reactions  . Phenergan [Promethazine Hcl] Other (See Comments)    Causes patient to" vomit more"  . Vitamin  D Analogs Rash    FAMILY HISTORY:  Family History  Problem Relation Age of Onset  . Hypertension Mother   . Hypertension Father    SOCIAL HISTORY:  reports that she has never smoked. She has never used smokeless tobacco. She reports that she does not drink alcohol or use illicit drugs.  REVIEW OF SYSTEMS:   Constitutional:   No  weight loss, night sweats,  Fevers, chills,  +fatigue, or  lassitude.  HEENT:   No headaches,  Difficulty swallowing,  Tooth/dental problems, or  Sore throat,                No sneezing, itching, ear ache, nasal congestion, post nasal drip,   CV:  No chest pain,  Orthopnea, PND, swelling in lower extremities, anasarca, dizziness, palpitations, syncope.   GI  No heartburn, indigestion, abdominal pain, nausea,  +vomiting, diarrhea, change in bowel habits, loss of appetite, bloody stools.   Resp: No shortness of breath with exertion or at rest.  No excess mucus, no productive cough,  No non-productive cough,  No coughing up of blood.  No change in color of mucus.  No wheezing.  No chest wall deformity  Skin: no rash or lesions.  GU: no dysuria, change in color of urine, no urgency or frequency.  No flank pain, no hematuria   MS:  No joint pain or swelling.  No decreased range of motion.  No back pain.  Psych:  No change in mood or affect. No depression or anxiety.  No memory loss.       SUBJECTIVE:  Several Dark stool last pm  and vomited dark blood this am x 1    VITAL SIGNS: Temp:  [98.2 F (36.8 C)-98.6 F (37 C)] 98.6 F (37 C) (12/21 1100) Pulse Rate:  [41-61] 50 (12/21 1100) Resp:  [0-20] 15 (12/21 1100) BP: (112-145)/(28-97) 145/45 mmHg (12/21 1100) SpO2:  [89 %-100 %] 100 % (12/21 1100) HEMODYNAMICS:   VENTILATOR SETTINGS:   INTAKE / OUTPUT: Intake/Output   None     PHYSICAL EXAMINATION: GEN: A/Ox3; pleasant , NAD, morbidly obese AAF   HEENT:  Sargeant/AT,  EACs-clear, TMs-wnl, NOSE-clear, THROAT-clear, no lesions, no postnasal  drip or exudate noted.   NECK:  Supple w/ fair ROM; no JVD; normal carotid impulses w/o bruits; no thyromegaly or nodules palpated; no lymphadenopathy.  RESP  Clear  P & A; w/o, wheezes/ rales/ or rhonchi.no accessory muscle use, no dullness to percussion  CARD:  RRR, 2/6 SM  , tr peripheral edema, pulses intact, no cyanosis or clubbing. Left arm fistula /graft -+thrill /bruit  R. HD cath   GI:   Soft & gen tenderness  nml bowel sounds; no organomegaly or masses detected.  Musco: Warm bil, no deformities or joint swelling noted.  L. BKA   Neuro: alert, no focal deficits noted.    Skin: Warm, no lesions or rashes  Hemoccult positive stool ++  LABS:  CBC  Recent Labs Lab 08/29/13 0742  WBC 12.3*  HGB 7.3*  HCT 21.9*  PLT 193   Coag's  Recent Labs Lab 08/29/13 0742  APTT 35  INR 1.60*   BMET  Recent Labs Lab 08/29/13 0742  NA 138  K 4.2  CL 91*  CO2 28  BUN 88*  CREATININE 6.67*  GLUCOSE 173*   Electrolytes  Recent Labs Lab 08/29/13 0742  CALCIUM 9.9   Sepsis Markers No results found for this basename: LATICACIDVEN, PROCALCITON, O2SATVEN,  in the last 168 hours ABG No results found for this basename: PHART, PCO2ART, PO2ART,  in the last 168 hours Liver Enzymes  Recent Labs Lab 08/29/13 0742  AST 19  ALT 17  ALKPHOS 64  BILITOT 0.3  ALBUMIN 3.2*   Cardiac Enzymes  Recent Labs Lab 08/29/13 0813  TROPONINI <0.30   Glucose No results found for this basename: GLUCAP,  in the last 168 hours  Imaging No results found.   CXR: rt ij cath, no edema  ASSESSMENT / PLAN:  PULMONARY A:  Sats adequate on RA  P:   CXR today for volume status Renal aware of risk edema  CARDIOVASCULAR A: Bradycardia asymptomatic thus far CAD  HTN   P:  Hold labetalol Hold HTN rx until more stable , monitor b/p closely  Atropine and pacer pads As needed   If drops BP, dopamine and cards eval ( dr Myrtis Ser cards doc) Give prbc trop  RENAL A:  ESRD  -contact renal as may need HD with extra volume infused today  Last HD 12/19   P:   Per Renal  Monitor and Replace electrolytes as indicated.   GASTROINTESTINAL A:  GIB  P:   GI consult  PPI drip  Coumadin reversal with vit K /ffp Place NGT w/ lavage x 1 , LIWS   HEMATOLOGIC A:  Anemia-Hemorrhagic, GI bleed, uremic  P:  Transfuse x 1 U PRBC  Tranx 2 U FFP  DDAVP x 1  Monitor CBC q 6 x 24   INFECTIOUS A:  No apparent acute infection  P:   Tr wbc /temp   ENDOCRINE A:  DM II  P:   SSI   NEUROLOGIC A:  High stroke risk and R. IJ thrombus started on coumadin during recent admission 12/12.  P:   Monitor closely off anticoagulation   TODAY'S SUMMARY: 67 yo ESRD on HD discharged recently with recurrent hypotension with HD and Right IJ thrombus started on coumadin now with GIB admitted to bloody diarrhea /vomit .  Reverse coumadin, tranx 1 u prbc and 2 u FFP, DDVAP . Renal /GI consult    I have personally obtained a history, examined the patient, evaluated laboratory and imaging results, formulated the assessment and plan and placed orders. CRITICAL CARE: The patient is critically ill with multiple organ systems failure and requires high complexity decision making for assessment and support, frequent evaluation and titration of therapies, application of advanced monitoring technologies and extensive interpretation of multiple databases. Critical Care Time devoted to patient care services described in this note is  30 minutes.   PARRETT,TAMMY NP-C  Pulmonary and Critical Care Medicine [facility] Pager: [phone]  08/29/2013, 11:11 AM    I have fully examined this patient and agree  with above findings.    And edited in full  Mcarthur Rossetti. Tyson Alias, MD, FACP Pgr: [phone] [facility]

## 2013-08-30 ENCOUNTER — Encounter (HOSPITAL_COMMUNITY)
Admission: EM | Disposition: A | Discharge status: Home Health | Payer: BC Managed Care – PPO | Source: Home / Self Care | Attending: Internal Medicine

## 2013-08-30 ENCOUNTER — Encounter (HOSPITAL_COMMUNITY): Payer: Self-pay

## 2013-08-30 DIAGNOSIS — D631 Anemia in chronic kidney disease: Secondary | ICD-10-CM | POA: Diagnosis present

## 2013-08-30 DIAGNOSIS — N2581 Secondary hyperparathyroidism of renal origin: Secondary | ICD-10-CM | POA: Diagnosis present

## 2013-08-30 HISTORY — PX: ESOPHAGOGASTRODUODENOSCOPY: SHX5428

## 2013-08-30 LAB — TYPE AND SCREEN: Unit division: 0

## 2013-08-30 LAB — CBC
HCT: 23.2 % — ABNORMAL LOW (ref 36.0–46.0)
HCT: 25.6 % — ABNORMAL LOW (ref 36.0–46.0)
Hemoglobin: 8 g/dL — ABNORMAL LOW (ref 12.0–15.0)
MCH: 32 pg (ref 26.0–34.0)
MCH: 31.9 pg (ref 26.0–34.0)
MCHC: 34.5 g/dL (ref 30.0–36.0)
MCHC: 33.8 g/dL (ref 30.0–36.0)
MCV: 92.8 fL (ref 78.0–100.0)
MCV: 93.1 fL (ref 78.0–100.0)
MCV: 93.8 fL (ref 78.0–100.0)
Platelets: 162 10*3/uL (ref 150–400)
Platelets: 157 10*3/uL (ref 150–400)
Platelets: 201 10*3/uL (ref 150–400)
RBC: 2.5 MIL/uL — ABNORMAL LOW (ref 3.87–5.11)
RBC: 2.73 MIL/uL — ABNORMAL LOW (ref 3.87–5.11)
RDW: 15.5 % (ref 11.5–15.5)
WBC: 11.8 10*3/uL — ABNORMAL HIGH (ref 4.0–10.5)
WBC: 14.2 10*3/uL — ABNORMAL HIGH (ref 4.0–10.5)

## 2013-08-30 LAB — GLUCOSE, CAPILLARY
Glucose-Capillary: 138 mg/dL — ABNORMAL HIGH (ref 70–99)
Glucose-Capillary: 166 mg/dL — ABNORMAL HIGH (ref 70–99)
Glucose-Capillary: 156 mg/dL — ABNORMAL HIGH (ref 70–99)

## 2013-08-30 LAB — PREPARE FRESH FROZEN PLASMA
Unit division: 0
Unit division: 0

## 2013-08-30 LAB — BASIC METABOLIC PANEL
BUN: 95 mg/dL — ABNORMAL HIGH (ref 6–23)
Calcium: 9.4 mg/dL (ref 8.4–10.5)
Creatinine, Ser: 7.7 mg/dL — ABNORMAL HIGH (ref 0.50–1.10)
GFR calc Af Amer: 6 mL/min — ABNORMAL LOW (ref >90)
GFR calc non Af Amer: 5 mL/min — ABNORMAL LOW (ref >90)
Glucose, Bld: 162 mg/dL — ABNORMAL HIGH (ref 70–99)
Potassium: 4.5 mEq/L (ref 3.5–5.1)

## 2013-08-30 LAB — TROPONIN I: Troponin I: 0.34 ng/mL (ref <0.30)

## 2013-08-30 SURGERY — EGD (ESOPHAGOGASTRODUODENOSCOPY)
Anesthesia: Moderate Sedation

## 2013-08-30 MED ORDER — ONDANSETRON HCL 4 MG/2ML IJ SOLN
4.0000 mg | Freq: Four times a day (QID) | INTRAMUSCULAR | Status: DC | PRN
  Administered 2013-08-30 (×2): 4 mg via INTRAVENOUS
  Filled 2013-08-30: qty 2

## 2013-08-30 MED ORDER — MIDAZOLAM HCL 5 MG/ML IJ SOLN
INTRAMUSCULAR | Status: AC
  Filled 2013-08-30: qty 2

## 2013-08-30 MED ORDER — FENTANYL CITRATE 0.05 MG/ML IJ SOLN
INTRAMUSCULAR | Status: DC | PRN
  Administered 2013-08-30 (×2): 25 ug via INTRAVENOUS

## 2013-08-30 MED ORDER — FENTANYL CITRATE 0.05 MG/ML IJ SOLN
INTRAMUSCULAR | Status: AC
  Filled 2013-08-30: qty 2

## 2013-08-30 MED ORDER — ACETAMINOPHEN 325 MG PO TABS
650.0000 mg | ORAL_TABLET | ORAL | Status: DC | PRN
  Administered 2013-08-30: 650 mg via ORAL
  Filled 2013-08-30: qty 2

## 2013-08-30 MED ORDER — DARBEPOETIN ALFA-POLYSORBATE 40 MCG/0.4ML IJ SOLN: 100.0000 ug | INTRAMUSCULAR | Status: DC

## 2013-08-30 MED ORDER — ONDANSETRON HCL 4 MG/2ML IJ SOLN
INTRAMUSCULAR | Status: AC
  Administered 2013-08-30: 4 mg via INTRAVENOUS
  Filled 2013-08-30: qty 2

## 2013-08-30 MED ORDER — DARBEPOETIN ALFA-POLYSORBATE 40 MCG/0.4ML IJ SOLN: 200.0000 ug | INTRAMUSCULAR | Status: DC

## 2013-08-30 MED ORDER — SODIUM CHLORIDE 0.9 % IV SOLN: INTRAVENOUS | Status: DC

## 2013-08-30 MED ORDER — MIDAZOLAM HCL 10 MG/2ML IJ SOLN
INTRAMUSCULAR | Status: DC | PRN
  Administered 2013-08-30: 2 mg via INTRAVENOUS
  Administered 2013-08-30: 1 mg via INTRAVENOUS

## 2013-08-30 MED ORDER — DOPAMINE-DEXTROSE 3.2-5 MG/ML-% IV SOLN
INTRAVENOUS | Status: AC
  Filled 2013-08-30: qty 250

## 2013-08-30 MED ORDER — DOPAMINE-DEXTROSE 3.2-5 MG/ML-% IV SOLN
2.0000 ug/kg/min | INTRAVENOUS | Status: DC
  Administered 2013-08-30: 5 ug/kg/min via INTRAVENOUS
  Filled 2013-08-30: qty 250

## 2013-08-30 NOTE — Progress Notes (Signed)
Name: Briana Gould MRN: [id number] DOB: Jun 16, 1946    ADMISSION DATE:  08/29/2013  REFERRING MD :  EDP  PRIMARY SERVICE: PCCM  CHIEF COMPLAINT:  GIB /Hypotension   BRIEF PATIENT DESCRIPTION: 67 yo with ESRD on HD recent admit 12/4 with hypotension during HD found to have R. IJ thrombus started on coumadin at discharge.  To ER on 12/21 with bloody diarrhea/vomit  found to be anemic with hgb 7.3.  PCCM to admit to ICU   SIGNIFICANT EVENTS / STUDIES:  12/21- brady, BP wnl 12/22 egd - LA Grade A esophagitis, Hiatal hernia, Antral ulcers with patchy gastritis, Duodenal ulcers with duodenitis 12/22 big pause with egd, sedation  LINES / TUBES:   CULTURES:   ANTIBIOTICS:  SUBJECTIVE:  Egd, pause  VITAL SIGNS: Temp:  [97.7 F (36.5 C)-98.8 F (37.1 C)] 98.5 F (36.9 C) (12/22 0700) Pulse Rate:  [42-59] 44 (12/22 0900) Resp:  [11-29] 12 (12/22 0900) BP: (111-194)/(32-89) 141/51 mmHg (12/22 0900) SpO2:  [84 %-100 %] 91 % (12/22 0900) Weight:  [80.6 kg (177 lb 11.1 oz)] 80.6 kg (177 lb 11.1 oz) (12/21 1254) HEMODYNAMICS:   VENTILATOR SETTINGS:   INTAKE / OUTPUT: Intake/Output     12/21 0701 - 12/22 0700 12/22 0701 - 12/23 0700   I.V. (mL/kg) 318.3 (3.9) 50 (0.6)   Blood 911.5    NG/GT 250    IV Piggyback 100    Total Intake(mL/kg) 1579.8 (19.6) 50 (0.6)   Emesis/NG output 450    Total Output 450     Net +1129.8 +50        Emesis Occurrence 2 x      PHYSICAL EXAMINATION: General: awake, alert Neuro: nonfocal exam HEENT: jvd wnl PULM: CTA reduced bases CV: s1 s2 brady rr GI: soft, bs hypo, mild tend Extremities: left bka   LABS:  CBC  Recent Labs Lab 08/29/13 2100 08/30/13 0255 08/30/13 0859  WBC 12.2* 12.6* 11.8*  HGB 7.7* 8.0* 7.7*  HCT 22.8* 23.2* 22.8*  PLT 156 162 157   Coag's  Recent Labs Lab 08/29/13 0742 08/29/13 1427  APTT 35  --   INR 1.60* 1.54*   BMET  Recent Labs Lab 08/29/13 0742 08/30/13 0255  NA 138 137   K 4.2 4.5  CL 91* 94*  CO2 28 25  BUN 88* 95*  CREATININE 6.67* 7.70*  GLUCOSE 173* 162*   Electrolytes  Recent Labs Lab 08/29/13 0742 08/30/13 0255  CALCIUM 9.9 9.4   Sepsis Markers  Recent Labs Lab 08/29/13 1428  LATICACIDVEN 2.4*   ABG No results found for this basename: PHART, PCO2ART, PO2ART,  in the last 168 hours Liver Enzymes  Recent Labs Lab 08/29/13 0742  AST 19  ALT 17  ALKPHOS 64  BILITOT 0.3  ALBUMIN 3.2*   Cardiac Enzymes  Recent Labs Lab 08/29/13 2100 08/30/13 0255 08/30/13 0858  TROPONINI <0.30 <0.30 0.34*   Glucose  Recent Labs Lab 08/29/13 2145 08/30/13 0037 08/30/13 0423 08/30/13 0729  GLUCAP 144* 138* 165* 126*    Imaging Dg Chest Port 1 View  08/29/2013   CLINICAL DATA:  Shortness of breath.  EXAM: PORTABLE CHEST - 1 VIEW  COMPARISON:  08/19/2013.  FINDINGS: Dialysis catheter again noted in stable position. Previously identified cardiomegaly and pulmonary vascular congestion remains. The pulmonary vascular congestion has improved from prior study. The previously identified interstitial prominence remains but has improved. No pleural effusion or pneumothorax.  IMPRESSION: Findings consistent with partial clearing of congestive heart  failure with pulmonary interstitial edema.   Electronically Signed   By: Maisie Fus  Register   On: 08/29/2013 12:39     CXR: none new  ASSESSMENT / PLAN:  PULMONARY A:  Sats adequate on RA  P:   Renal to decide on need pcxr for volume in am   CARDIOVASCULAR A: Bradycardia asymptomatic thus far CAD  HTN Pause with egd / sedation self resolved  P:  Continue to Hold labetalol Atropine and pacer pads on chest, cards seeing If drops BP, dopamine Trop not impressive, no need repeat Tele For HD, then re assess HTN  RENAL A:  ESRD -contact renal as may need HD with extra volume infused today  Last HD 12/19   P:   Per Renal  pcxr in am   GASTROINTESTINAL A:  GIB  LA Grade A  esophagitis.  2) Hiatal hernia.  3) Antral ulcers with patchy gastritis.  4) Duodenal ulcers with duodenitis  P:   GI consult appreciated PPI drip maintain Cbc  Diet per GI  HEMATOLOGIC A:  Anemia-Hemorrhagic, GI bleed, uremic Pos balance P:  inr wnl Cbc in pm scd HD will concentrate hct  INFECTIOUS A:  No apparent acute infection  P:   Tr wbc /temp   ENDOCRINE A:  DM II  P:   SSI   NEUROLOGIC A:  High stroke risk and R. IJ thrombus started on coumadin during recent admission 12/12.  P:   Monitor closely off anticoagulation   TODAY'S SUMMARY: egd done, ppi drip, pause noted, caution on hd today, may need pacer pre dc    I have personally obtained a history, examined the patient, evaluated laboratory and imaging results, formulated the assessment and plan and placed orders. CRITICAL CARE: The patient is critically ill with multiple organ systems failure and requires high complexity decision making for assessment and support, frequent evaluation and titration of therapies, application of advanced monitoring technologies and extensive interpretation of multiple databases. Critical Care Time devoted to patient care services described in this note is  30 minutes.     I have fully examined this patient and agree with above findings.    And edited in full  Mcarthur Rossetti. Tyson Alias, MD, FACP Pgr: [phone] [place] [facility]

## 2013-08-30 NOTE — Progress Notes (Signed)
    Subjective:  No chest pain, palpitations, or syncope. Events today noted. Had junctional brady this afternoon associated with hypotension and now on dopamine 5 mcg. Pt has no hx of syncope.  Objective:  Vital Signs in the last 24 hours: Temp:  [97.7 F (36.5 C)-98.6 F (37 C)] 98.4 F (36.9 C) (12/22 1522) Pulse Rate:  [42-96] 64 (12/22 1815) Resp:  [8-29] 12 (12/22 1815) BP: (112-194)/(18-89) 170/59 mmHg (12/22 1815) SpO2:  [84 %-100 %] 96 % (12/22 1815) Weight:  [182 lb 5.1 oz (82.7 kg)] 182 lb 5.1 oz (82.7 kg) (12/22 1200)  Intake/Output from previous day: 12/21 0701 - 12/22 0700 In: 1579.8 [I.V.:318.3; Blood:911.5; NG/GT:250; IV Piggyback:100] Out: 450 [Emesis/NG output:450]  Physical Exam: Pt is alert and oriented, chronically ill appearing woman in NAD HEENT: normal Neck: JVP - normal Lungs: CTA bilaterally CV: RRR with grade 2/6 systolic murmur at the LSB Abd: soft, NT, Positive BS, no hepatomegaly Ext: left BKA Skin: warm/dry no rash   Lab Results:  Recent Labs  08/30/13 0255 08/30/13 0859  WBC 12.6* 11.8*  HGB 8.0* 7.7*  PLT 162 157    Recent Labs  08/29/13 0742 08/30/13 0255  NA 138 137  K 4.2 4.5  CL 91* 94*  CO2 28 25  GLUCOSE 173* 162*  BUN 88* 95*  CREATININE 6.67* 7.70*    Recent Labs  08/30/13 0255 08/30/13 0858  TROPONINI <0.30 0.34*    Cardiac Studies:   Tele: Junctional bradycardia, now in sinus rhythm  Assessment/Plan:  1. Junctional bradycardia 2. Acute GI bleed 3. 3 vessel CAD 4. ESRD 5. Mild aortic stenosis 6. Right IJ DVT  Pt now on dopamine to maintain adequate MAP and heart rate. No obvious electrolyte-related cause of bradycardia. Beta-blocker should be washing out. In my opinion, permanent pacemaker should be last resort considering hemodialysis with increased infection risk and access limitations (left arm AV fistula, Right IJ DVT, right subclavian dialysis catheter). Will ask EP to see her tomorrow for a  formal opinion. Continue dopamine overnight.   Tonny Bollman, M.D. 08/30/2013, 6:27 PM

## 2013-08-30 NOTE — Progress Notes (Signed)
Nutrition Brief Note  Patient identified on the Malnutrition Screening Tool (MST) Report  Wt Readings from Last 15 Encounters:  08/29/13 177 lb 11.1 oz (80.6 kg)  08/29/13 177 lb 11.1 oz (80.6 kg)  08/20/13 194 lb 3.6 oz (88.1 kg)  11/08/11 175 lb 11.3 oz (79.7 kg)  10/09/10 204 lb 9 oz (92.789 kg)  10/04/10 203 lb (92.08 kg)  09/13/10 203 lb 12 oz (92.42 kg)    Body mass index is 28.69 kg/(m^2). Patient meets criteria for Overweight based on current BMI.   Current diet order is NPO. Labs and medications reviewed.   Patient denied any changes in weight or appetite prior to admission. Had some episodes of nausea and vomiting; this however, did not affect her PO intake. NPO r/t EGD. Was eager for diet advancement.  No nutrition interventions warranted at this time. If nutrition issues arise, please consult RD.   Lloyd Huger MS RD LDN Clinical Dietitian Pager:[phone]

## 2013-08-30 NOTE — Progress Notes (Signed)
Dr Tyson Alias notified of pt with pulse rate sustaining in mid 30s to low 40s SB. Pt with MAP on mid 50s and becoming harder to arouse and pt stating she feels more tired then normal. Pt remains on zoll pads. Orders to infuse dopamine given and started. Will continue to monitor.

## 2013-08-30 NOTE — Interval H&P Note (Signed)
History and Physical Interval Note:  08/30/2013 7:26 AM  Briana Gould  has presented today for surgery, with the diagnosis of upper GI bleeding  The various methods of treatment have been discussed with the patient and family. After consideration of risks, benefits and other options for treatment, the patient has consented to  Procedure(s): ESOPHAGOGASTRODUODENOSCOPY (EGD) (N/A) as a surgical intervention .  The patient's history has been reviewed, patient examined, no change in status, stable for surgery.  I have reviewed the patient's chart and labs.  Questions were answered to the patient's satisfaction.     [name] D

## 2013-08-30 NOTE — ED Provider Notes (Signed)
Medical screening examination/treatment/procedure(s) were performed by non-physician practitioner and as supervising physician I was immediately available for consultation/collaboration.  EKG Interpretation    Date/Time:    Ventricular Rate:    PR Interval:    QRS Duration:   QT Interval:    QTC Calculation:   R Axis:     Text Interpretation:                Rolland Porter, MD 08/30/13 2256

## 2013-08-30 NOTE — Op Note (Signed)
[facility] [address] [place], [address]   OPERATIVE PROCEDURE REPORT  PATIENT: Briana Gould, Briana Gould  MR#: [id number] BIRTHDATE: 1945/11/18  GENDER: Female ENDOSCOPIST: Jeani Hawking, MD ASSISTANT:   Nilsa Nutting, Endo Technician, Oletha Blend, technician, and Jimmey Ralph, RN, CGRN PROCEDURE DATE: 08/30/2013 PROCEDURE:   EGD w/ biopsy ASA CLASS:   Class IV INDICATIONS:Melena and Anemia. MEDICATIONS: Versed 3 mg IV and Fentanyl 50 mcg IV TOPICAL ANESTHETIC:   none  DESCRIPTION OF PROCEDURE:   After the risks benefits and alternatives of the procedure were thoroughly explained, informed consent was obtained.  The PENTAX GASTOROSCOPE [id number]  endoscope was introduced through the mouth  and advanced to the second portion of the duodenum Without limitations.      The instrument was slowly withdrawn as the mucosa was fully examined.      FINDINGS: In the distal esophagus is an LA Grade A esophagitis in a setting of a 3-4 cm hiatal hernia.  In the antrum four clean-based nonbleeding ulcers were identified.  These ulcers were 1-1.5 cm in size.  A small duodenal ulcer was found in the duodenal bulb. Gastritis and duodenitis were identified and one pass was made with the cold biopsy forcep in the gastric lumen.  A second attempt was performed, but she became bradycardic to 15 BPM.   Retroflexed views revealed no abnormalities.     The scope was then withdrawn from the patient and the procedure terminated.  COMPLICATIONS: There were no complications. IMPRESSION: 1) LA Grade A esophagitis. 2) Hiatal hernia. 3) Antral ulcers with patchy gastritis. 4) Duodenal ulcers with duodenitis.  RECOMMENDATIONS: 1) Await biopsy results. 2) PPI BID. 3) Minimize anticoagulation and monitor HGB.  If there is a progressive drop in HGB, anticoagulation needs to be stopped.   _______________________________ eSignedJeani Hawking, MD 08/30/2013 8:11  AM

## 2013-08-30 NOTE — Significant Event (Signed)
Pt with nausea.  Will order prn zofran.  Coralyn Helling, MD 08/30/2013, 2:45 AM

## 2013-08-30 NOTE — H&P (View-Only) (Signed)
Pt transferred to 3MW, ICU NG lavage neg, with yellowish return Hgb down to 6.6, before pRBC transfusion Will plan EGD tomorrow after transfusion with FFP and pRBC Cardiac monitoring for recurrent bradycardia, cards consult if further junctional rhythm.  Cycling cardiac enzymes PPI gtt Holding anticoagulation EGD will be with Dr. Hung, [facility]  

## 2013-08-30 NOTE — Progress Notes (Signed)
Subjective:  Weak, drowsy s/p endoscopy this morning. Daughter nervous about dialysis  Objective: Vital signs in last 24 hours: Temp:  [97.7 F (36.5 C)-98.8 F (37.1 C)] 98.5 F (36.9 C) (12/22 0700) Pulse Rate:  [41-59] 46 (12/22 0800) Resp:  [0-29] 15 (12/22 0800) BP: (111-194)/(29-97) 122/45 mmHg (12/22 0800) SpO2:  [84 %-100 %] 100 % (12/22 0800) Weight:  [80.6 kg (177 lb 11.1 oz)] 80.6 kg (177 lb 11.1 oz) (12/21 1254) Weight change:   Intake/Output from previous day: 12/21 0701 - 12/22 0700 In: 1579.8 [I.V.:318.3; Blood:911.5; NG/GT:250; IV Piggyback:100] Out: 450 [Emesis/NG output:450] Intake/Output this shift: Total I/O In: 25 [I.V.:25] Out: -   Lab Results:  Recent Labs  08/29/13 2100 08/30/13 0255  WBC 12.2* 12.6*  HGB 7.7* 8.0*  HCT 22.8* 23.2*  PLT 156 162   BMET:  Recent Labs  08/29/13 0742 08/30/13 0255  NA 138 137  K 4.2 4.5  CL 91* 94*  CO2 28 25  GLUCOSE 173* 162*  BUN 88* 95*  CREATININE 6.67* 7.70*  CALCIUM 9.9 9.4  ALBUMIN 3.2*  --    No results found for this basename: PTH,  in the last 72 hours Iron Studies: No results found for this basename: IRON, TIBC, TRANSFERRIN, FERRITIN,  in the last 72 hours  EXAM: General appearance: Drowsy, in no apparent distress Resp:  CTA without rales, rhonchi, or wheezesdiminished bs Cardio:  HR in 40s, with Gr II/VI systolic murmur, no rub GI:  + BS, soft and nontender Extremities:  No edema, L BKA RIJ dialysis cath Access:  AVF @ LUA with + bruit  Dialysis: MWF [facility]  3:45hrs F180 81kg 2K/2.25 bath Prof 2 Heparin none R IJ cath-active LUA AVF-maturing needs longer HD with lower UFR Hectorol 8 Epo 3000  Last labs: Hb 9.0 tsat 36% ferritin 1037 calc 10.0 phos 4.2 pth 455  Assessment/Plan: 1. Upper GI bleed - esophagitis, hiatal hernia, antral ulcers, duodenal ulcers per EGD per Dr. Elnoria Howard today; biopsy results pending, started PPI bid, Coumadin on hold. 2. Bradycardia - HR fell to 15 bpm  during EGD today; s/p cardiac arrest during HD at last hospitalization (12/5-12), no events at outpatient center, but instructed to maintain SBP > 140. 3. DVT - found to have cath-associated R IJ DVT during ast hospitalization, dc'd on Coumadin.  4. ESRD - HD on MWF @ [facility], K 4.5, missed HD yesterday, pending today. 5. HTN/Volume - on outpatient Amlodipine 10 mg qd, Doxazosin 8 mg qhs, Labetalol 200 mg bid, Lisinopril 40 mg qd, Hydralazine 50 mg tid, all on hold; wt 80.6 kg, below EDW.Needs less meds, longer lower UFR 6. Anemia - Hgb 8, up from 6.6 s/p transfusion, Aranesp.  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 8. DM Type 2 9. Aortic stenosis   LOS: 1 day   Briana Gould,Briana Gould 08/30/2013,9:09 AM I have seen and examined this patient and agree with the plan of care seen eval, examined, counseled. .  Revere Maahs L 08/30/2013, 10:24 AM

## 2013-08-31 ENCOUNTER — Inpatient Hospital Stay (HOSPITAL_COMMUNITY): Payer: Medicare Other

## 2013-08-31 ENCOUNTER — Encounter (HOSPITAL_COMMUNITY): Payer: Self-pay | Admitting: Gastroenterology

## 2013-08-31 DIAGNOSIS — I10 Essential (primary) hypertension: Secondary | ICD-10-CM

## 2013-08-31 DIAGNOSIS — I495 Sick sinus syndrome: Secondary | ICD-10-CM

## 2013-08-31 LAB — CBC
HCT: 24 % — ABNORMAL LOW (ref 36.0–46.0)
Hemoglobin: 8.2 g/dL — ABNORMAL LOW (ref 12.0–15.0)
RDW: 15.5 % (ref 11.5–15.5)
WBC: 11 10*3/uL — ABNORMAL HIGH (ref 4.0–10.5)

## 2013-08-31 LAB — GLUCOSE, CAPILLARY
Glucose-Capillary: 119 mg/dL — ABNORMAL HIGH (ref 70–99)
Glucose-Capillary: 109 mg/dL — ABNORMAL HIGH (ref 70–99)
Glucose-Capillary: 117 mg/dL — ABNORMAL HIGH (ref 70–99)
Glucose-Capillary: 141 mg/dL — ABNORMAL HIGH (ref 70–99)

## 2013-08-31 LAB — BASIC METABOLIC PANEL
CO2: 26 mEq/L (ref 19–32)
Glucose, Bld: 114 mg/dL — ABNORMAL HIGH (ref 70–99)
Potassium: 3.3 mEq/L — ABNORMAL LOW (ref 3.5–5.1)
Sodium: 141 mEq/L (ref 135–145)

## 2013-08-31 NOTE — Consult Note (Signed)
ELECTROPHYSIOLOGY CONSULT NOTE    [name] MRN: [id number], DOB/AGE: 1946-05-09 68 y.o.  Admit date: 08/29/2013 Date of Consult: 08-31-2013  Primary Physician: Briana Slimmer, MD Primary Cardiologist: Briana Gould  Reason for Consultation: bradycardia  HPI:  Briana Gould is a 67 y.o. female with PMH of diffuse CAD by cath 2011 not amenable to PCI/CABG, ESRD on HD, PAD, pulm HTN, and recent admission 08/2013 for a possible cardiac arrest with subsequent diagnoses of R IJ DVT, possible CVA by MRI, cerebrovascular disease and carotid artery disease. The cardiac arrest was felt possibly related to cerebral hypoperfusion in the setting of hypotension during dialysis. She had brief CPR - rhythm was unclear; she reportedly was fully intact after episode and did not require intubation. On 08-29-2013 she returned to [facility] today with weakness, nausea, coffee-ground emesis, melena, and acute anemia with Hgb down to 6.6. She's had some intermittent RLQ pain.  Initial ED EKG revealed the patient to be in junctional bradycardia with a HR in the 40's. She has been treated with FFP, pRBC, and PPI.  EGD this admission demonstrated LA grade A esophagitis, hiatal hernia, antral and duodenal ulcers.  Recommendations per GI are to minimize anticoagulation, and treat with PPI.    Telemetry has demonstrated sinus bradycardia with junctional rhythm on admission.  She has been placed on Dopamine at 4 with improvement in sinus rates.  Currently, she is SR in the 60's.  Prior to admission, she denies frank syncope or pre-syncope.  She does state that she has had fatigue prior to admission, but this has been chronic for several months.    She has a left arm AV fistula, right IJ DVT, and right subclavian dialysis catheter which will limit vascular access.  She denies chest pain or shortness of breath.  She states that her nausea and vomiting have improved.  Her Hgb has improved with PRBC and FFP.     EP has been asked to evaluate for treatment options.  ROS is negative except as outlined above.   Past Medical History  Diagnosis Date  . Hypertension   . PAD (peripheral artery disease)     a. L BKA  . Type II diabetes mellitus   . Anemia   . History of blood transfusion 2002    "related to amputation" (08/13/2013)  . End stage renal disease on dialysis     "[facility]; M, W, F" (08/13/2013)  . DVT (deep venous thrombosis)     a. R IJ DVT 08/2013.  Marland Kitchen CVA (cerebral infarction)     a. MRI 08/2013: possible left motor strip infarct with remote pontine infarct.  . Pulmonary HTN     a. Cath 2011 - mod to severe pulmonary hypertension. b. Echo 08/2013: PAP (mild-mod increased).  . Thrombocytopenia   . CAD (coronary artery disease)     a. Severe diffuse diabetic disease by cath 2011, for med rx, not a candidate for CABG or PCI at that time.  . Aortic stenosis   . Syncope     a. 2011, etiology never clear.  . Carotid artery disease     a. Dopp 13/0865: 1-39% LICA< 60-79% RICA borderline >80%.  . Cerebrovascular disease     a. 08/2013 MRA brain: multiple bilateral moderate and high grade intracranial stenoses  . Cardiac arrest 08/14/13    a. Possible arrest in setting of hypotension during dialysis with possible arrest requiring brief CPR, not intubated and was fully intact after episode. Suspected due  to low cerebral perfusion in setting of hypotension. Unclear if any arrhythmia.     Surgical History:  Past Surgical History  Procedure Laterality Date  . Av fistula placement Left 2011    upper arm  . Below knee leg amputation Left 2002  . Tonsillectomy    . Cataract extraction w/ intraocular lens  implant, bilateral Bilateral 2012-2014  . Esophagogastroduodenoscopy N/A 08/30/2013    Procedure: ESOPHAGOGASTRODUODENOSCOPY (EGD);  Surgeon: Theda Belfast, MD;  Location: [facility] ENDOSCOPY;  Service: Endoscopy;  Laterality: N/A;     Prescriptions prior to admission   Medication Sig Dispense Refill  . amLODipine (NORVASC) 10 MG tablet Take 10 mg by mouth daily.      Marland Kitchen b complex-vitamin c-folic acid (NEPHRO-VITE) 0.8 MG TABS tablet Take 1 tablet by mouth daily.      Marland Kitchen doxazosin (CARDURA) 8 MG tablet Take 8 mg by mouth at bedtime.      . enoxaparin (LOVENOX) 80 MG/0.8ML injection Inject 0.8 mLs (80 mg total) into the skin daily.  5 Syringe  0  . hydrALAZINE (APRESOLINE) 25 MG tablet Take 2 tablets (50 mg total) by mouth 3 (three) times daily.  90 tablet  3  . ketorolac (TORADOL) 10 MG tablet Take 10 mg by mouth every 6 (six) hours as needed for moderate pain.      Marland Kitchen labetalol (NORMODYNE) 200 MG tablet Take 200 mg by mouth 2 (two) times daily.      Marland Kitchen lisinopril (PRINIVIL,ZESTRIL) 40 MG tablet Take 40 mg by mouth daily.      . Nutritional Supplements (FEEDING SUPPLEMENT, NEPRO CARB STEADY,) LIQD Take 237 mLs by mouth daily.    0  . sevelamer carbonate (RENVELA) 800 MG tablet Take 800 mg by mouth 3 (three) times daily with meals.      . warfarin (COUMADIN) 5 MG tablet Take 1 tablet (5 mg total) by mouth daily.  15 tablet  0    Inpatient Medications:  . darbepoetin  200 mcg Intravenous Q Mon-HD  . insulin aspart  0-15 Units Subcutaneous Q4H  . [START ON 09/02/2013] pantoprazole (PROTONIX) IV  40 mg Intravenous Q12H  . pantoprazole (PROTONIX) IV  40 mg Intravenous Once    Allergies:  Allergies  Allergen Reactions  . Phenergan [Promethazine Hcl] Other (See Comments)    Causes patient to" vomit more"  . Vitamin D Analogs Rash    History   Social History  . Marital Status: Married    Spouse Name: N/A    Number of Children: N/A  . Years of Education: N/A   Occupational History  . Not on file.   Social History Main Topics  . Smoking status: Never Smoker   . Smokeless tobacco: Never Used  . Alcohol Use: No  . Drug Use: No  . Sexual Activity: Not Currently   Other Topics Concern  . Not on file   Social History Narrative  . No narrative on  file     Family History  Problem Relation Age of Onset  . Hypertension Mother   . Hypertension Father     Physical Exam stable appearing 67 yo woman, NAD HEENT: Unremarkable Neck:  No JVD, no thyromegally Back:  No CVA tenderness Lungs:  Clear with no wheezes HEART:  Regular rate rhythm, no murmurs, no rubs, no clicks Abd:  soft, positive bowel sounds, no organomegally, no rebound, no guarding Ext:  2 plus pulses, no edema, no cyanosis, no clubbing, left arm with a fistula and right  IJ with an indwelling dialysis catheter Skin:  No rashes no nodules Neuro:  CN II through XII intact, motor grossly intact   Labs:   Lab Results  Component Value Date   WBC 14.2* 08/30/2013   HGB 8.7* 08/30/2013   HCT 25.6* 08/30/2013   MCV 93.8 08/30/2013   PLT 201 08/30/2013    Recent Labs Lab 08/29/13 0742  08/31/13 0403  NA 138  < > 141  K 4.2  < > 3.3*  CL 91*  < > 98  CO2 28  < > 26  BUN 88*  < > 29*  CREATININE 6.67*  < > 3.46*  CALCIUM 9.9  < > 9.1  PROT 6.7  --   --   BILITOT 0.3  --   --   ALKPHOS 64  --   --   ALT 17  --   --   AST 19  --   --   GLUCOSE 173*  < > 114*  < > = values in this interval not displayed.  Radiology/Studies: Dg Chest 1 View 08/13/2013   CLINICAL DATA:  68 year old female with acute onset diffusion shortness of breath and chest pain while in dialysis. Initial encounter.  EXAM: CHEST - 1 VIEW  COMPARISON:  11/03/2011 and earlier.  FINDINGS: Portable AP upright view at 0923 hrs. Right IJ approach dual lumen dialysis catheter in place. Stable lung volumes. Stable cardiomegaly and mediastinal contours. No pneumothorax, pulmonary edema, pleural effusion or consolidation. Overall decreased pulmonary vascularity compared to the previous exam. Multiple wires an EKG leads overlie the chest.  IMPRESSION: No acute cardiopulmonary abnormality.   Electronically Signed   By: Augusto Gamble M.D.   On: 08/13/2013 10:06   Dg Chest Port 1 View 08/31/2013   CLINICAL DATA:   Edema.  EXAM: PORTABLE CHEST - 1 VIEW  COMPARISON:  08/29/2013  FINDINGS: The patient is rotated to the right. Right jugular dialysis catheter remains in place with tips overlying the mid and lower SVC. The cardiac silhouette remains enlarged. Mild interstitial edema is present, which appears slightly worse than on the prior study. No large pleural effusion is identified, however the left costophrenic angle is excluded from the image.  IMPRESSION: Slight worsening of mild interstitial edema.   Electronically Signed   By: Sebastian Ache   On: 08/31/2013 07:52   ZOX:[id number] rhythm at 43, QRS 143  TELEMETRY: predominately sinus brady with rates 40-60  A/P 1. Sinus node dysfunction 2. GI Bleed 3. HTN 4. ESRD on HD 5. Cerebral vascular disease  Rec: patient is a very poor candidate for PPM insertion due to her risk of infection with indwelling dialysis catheter and AV fistula. I would try to wean off all of the dopamine. Rarely I have used theophylline to increase the sinus rate. Long term prognosis is poor. Would not recommend leadless PM as pulmonary HTN a contra-indication.  Leonia Reeves.D.

## 2013-08-31 NOTE — Progress Notes (Signed)
Subjective: No acute events.  Feeling well.  Objective: Vital signs in last 24 hours: Temp:  [98.1 F (36.7 C)-98.9 F (37.2 C)] 98.4 F (36.9 C) (12/23 0326) Pulse Rate:  [42-96] 57 (12/23 0500) Resp:  [8-29] 12 (12/23 0500) BP: (112-194)/(18-89) 157/59 mmHg (12/23 0500) SpO2:  [84 %-100 %] 95 % (12/23 0500) Weight:  [174 lb 13.2 oz (79.3 kg)-182 lb 5.1 oz (82.7 kg)] 174 lb 13.2 oz (79.3 kg) (12/23 0300) Last BM Date: 08/28/13  Intake/Output from previous day: 12/22 0701 - 12/23 0700 In: 643.6 [P.O.:100; I.V.:543.6] Out: 1700  Intake/Output this shift: Total I/O In: 308.3 [P.O.:100; I.V.:208.3] Out: 1700 [Other:1700]  General appearance: alert and no distress GI: soft, non-tender; bowel sounds normal; no masses,  no organomegaly  Lab Results:  Recent Labs  08/30/13 0255 08/30/13 0859 08/30/13 2055  WBC 12.6* 11.8* 14.2*  HGB 8.0* 7.7* 8.7*  HCT 23.2* 22.8* 25.6*  PLT 162 157 201   BMET  Recent Labs  08/29/13 0742 08/30/13 0255 08/31/13 0403  NA 138 137 141  K 4.2 4.5 3.3*  CL 91* 94* 98  CO2 28 25 26   GLUCOSE 173* 162* 114*  BUN 88* 95* 29*  CREATININE 6.67* 7.70* 3.46*  CALCIUM 9.9 9.4 9.1   LFT  Recent Labs  08/29/13 0742  PROT 6.7  ALBUMIN 3.2*  AST 19  ALT 17  ALKPHOS 64  BILITOT 0.3   PT/INR  Recent Labs  08/29/13 0742 08/29/13 1427  LABPROT 18.6* 18.1*  INR 1.60* 1.54*   Hepatitis Panel No results found for this basename: HEPBSAG, HCVAB, HEPAIGM, HEPBIGM,  in the last 72 hours C-Diff No results found for this basename: CDIFFTOX,  in the last 72 hours Fecal Lactopherrin No results found for this basename: FECLLACTOFRN,  in the last 72 hours  Studies/Results: Dg Chest Port 1 View  08/29/2013   CLINICAL DATA:  Shortness of breath.  EXAM: PORTABLE CHEST - 1 VIEW  COMPARISON:  08/19/2013.  FINDINGS: Dialysis catheter again noted in stable position. Previously identified cardiomegaly and pulmonary vascular congestion remains. The  pulmonary vascular congestion has improved from prior study. The previously identified interstitial prominence remains but has improved. No pleural effusion or pneumothorax.  IMPRESSION: Findings consistent with partial clearing of congestive heart failure with pulmonary interstitial edema.   Electronically Signed   By: Maisie Fus  Register   On: 08/29/2013 12:39    Medications:  Scheduled: . darbepoetin  200 mcg Intravenous Q Mon-HD  . insulin aspart  0-15 Units Subcutaneous Q4H  . [START ON 09/02/2013] pantoprazole (PROTONIX) IV  40 mg Intravenous Q12H  . pantoprazole (PROTONIX) IV  40 mg Intravenous Once   Continuous: . sodium chloride    . DOPamine 3 mcg/kg/min (08/31/13 0600)  . pantoprozole (PROTONIX) infusion 8 mg/hr (08/31/13 0600)    Assessment/Plan: 1) Gastric ulcers - clean-based. 2) Esophagitis. 3) Duodenal ulcer.   Her HGB has increased.  I do not know if she received another unit of blood with dialysis.  Clinically she is improved overall.    Plan: 1) Continue with Protonix.  She will need to be on a PPI indefinitely.  She needs to be treated BID for one month and then QD. 2) Await gastric biopsy results for H. Pylori status.   LOS: 2 days   Ahava Kissoon D 08/31/2013, 6:49 AM

## 2013-08-31 NOTE — Progress Notes (Signed)
Subjective: Interval History: none.  Objective: Vital signs in last 24 hours: Temp:  [98.1 F (36.7 C)-99.1 F (37.3 C)] 99.1 F (37.3 C) (12/23 0700) Pulse Rate:  [42-96] 43 (12/23 0834) Resp:  [0-25] 14 (12/23 0834) BP: (112-184)/(18-75) 125/28 mmHg (12/23 0834) SpO2:  [91 %-100 %] 100 % (12/23 0834) Weight:  [79.3 kg (174 lb 13.2 oz)-82.7 kg (182 lb 5.1 oz)] 79.3 kg (174 lb 13.2 oz) (12/23 0300) Weight change: 2.1 kg (4 lb 10.1 oz)  Intake/Output from previous day: 12/22 0701 - 12/23 0700 In: 673.3 [P.O.:100; I.V.:573.3] Out: 1700  Intake/Output this shift: Total I/O In: 121.4 [P.O.:118; I.V.:3.4] Out: -   General appearance: alert, cooperative, no distress and moderately obese Resp: clear to auscultation bilaterally Cardio: regular rate and rhythm, systolic murmur: holosystolic 2/6, blowing at apex and rate 50s , has been lower GI: pos bs, liver down 4 cm. obese Extremities: AVF LUA BH, B&T  Lab Results:  Recent Labs  08/30/13 0859 08/30/13 2055  WBC 11.8* 14.2*  HGB 7.7* 8.7*  HCT 22.8* 25.6*  PLT 157 201   BMET:  Recent Labs  08/30/13 0255 08/31/13 0403  NA 137 141  K 4.5 3.3*  CL 94* 98  CO2 25 26  GLUCOSE 162* 114*  BUN 95* 29*  CREATININE 7.70* 3.46*  CALCIUM 9.4 9.1   No results found for this basename: PTH,  in the last 72 hours Iron Studies: No results found for this basename: IRON, TIBC, TRANSFERRIN, FERRITIN,  in the last 72 hours  Studies/Results: Dg Chest Port 1 View  08/31/2013   CLINICAL DATA:  Edema.  EXAM: PORTABLE CHEST - 1 VIEW  COMPARISON:  08/29/2013  FINDINGS: The patient is rotated to the right. Right jugular dialysis catheter remains in place with tips overlying the mid and lower SVC. The cardiac silhouette remains enlarged. Mild interstitial edema is present, which appears slightly worse than on the prior study. No large pleural effusion is identified, however the left costophrenic angle is excluded from the image.  IMPRESSION:  Slight worsening of mild interstitial edema.   Electronically Signed   By: Sebastian Ache   On: 08/31/2013 07:52   Dg Chest Port 1 View  08/29/2013   CLINICAL DATA:  Shortness of breath.  EXAM: PORTABLE CHEST - 1 VIEW  COMPARISON:  08/19/2013.  FINDINGS: Dialysis catheter again noted in stable position. Previously identified cardiomegaly and pulmonary vascular congestion remains. The pulmonary vascular congestion has improved from prior study. The previously identified interstitial prominence remains but has improved. No pleural effusion or pneumothorax.  IMPRESSION: Findings consistent with partial clearing of congestive heart failure with pulmonary interstitial edema.   Electronically Signed   By: Maisie Fus  Register   On: 08/29/2013 12:39    I have reviewed the patient's current medications.  Assessment/Plan: 1 ESRD for HD in am, had HD last pm 2 DM controlled 3 GIB Hb stable 4 Bradycardia suspect this is the cause of arrest and nonresponsive episodes. May need pacer 5 Access use AVF 6 Obesity 7 Anemia epo, Fe P 8 HPTH P HD epo, follow HB, support HR use AVF    LOS: 2 days   Deral Schellenberg L 08/31/2013,9:06 AM

## 2013-08-31 NOTE — Progress Notes (Signed)
Name: Briana Gould MRN: [id number] DOB: 10/23/1945    ADMISSION DATE:  08/29/2013  REFERRING MD :  EDP  PRIMARY SERVICE: PCCM  CHIEF COMPLAINT:  GIB /Hypotension   BRIEF PATIENT DESCRIPTION: 67 yo with ESRD on HD recent admit 12/4 with hypotension during HD found to have R. IJ thrombus started on coumadin at discharge.  To ER on 12/21 with bloody diarrhea/vomit  found to be anemic with hgb 7.3.  PCCM to admit to ICU   SIGNIFICANT EVENTS / STUDIES:  12/21- brady, BP wnl 12/22 egd - LA Grade A esophagitis, Hiatal hernia, Antral ulcers with patchy gastritis, Duodenal ulcers with duodenitis 12/22 big pause with egd, sedation, HD 12/23- requiring dopamine  LINES / TUBES:  CULTURES:  ANTIBIOTICS:  SUBJECTIVE:  Egd, pause  VITAL SIGNS: Temp:  [98.1 F (36.7 C)-99.1 F (37.3 C)] 99.1 F (37.3 C) (12/23 0700) Pulse Rate:  [42-96] 59 (12/23 0915) Resp:  [0-25] 19 (12/23 0915) BP: (112-184)/(18-75) 172/41 mmHg (12/23 0915) SpO2:  [91 %-100 %] 93 % (12/23 0915) Weight:  [79.3 kg (174 lb 13.2 oz)-82.7 kg (182 lb 5.1 oz)] 79.3 kg (174 lb 13.2 oz) (12/23 0300) HEMODYNAMICS:   VENTILATOR SETTINGS:   INTAKE / OUTPUT: Intake/Output     12/22 0701 - 12/23 0700 12/23 0701 - 12/24 0700   P.O. 100 118   I.V. (mL/kg) 573.3 (7.2) 3.4 (0)   Blood     NG/GT     IV Piggyback     Total Intake(mL/kg) 673.3 (8.5) 121.4 (1.5)   Emesis/NG output     Other 1700    Total Output 1700     Net -1026.7 +121.4          PHYSICAL EXAMINATION: General: awake, alert Neuro: nonfocal exam HEENT: jvd wnl PULM: more clear CV: s1 s2 brady rr GI: soft, bs wnl, mild tend Extremities: left bka   LABS:  CBC  Recent Labs Lab 08/30/13 0255 08/30/13 0859 08/30/13 2055  WBC 12.6* 11.8* 14.2*  HGB 8.0* 7.7* 8.7*  HCT 23.2* 22.8* 25.6*  PLT 162 157 201   Coag's  Recent Labs Lab 08/29/13 0742 08/29/13 1427  APTT 35  --   INR 1.60* 1.54*   BMET  Recent Labs Lab  08/29/13 0742 08/30/13 0255 08/31/13 0403  NA 138 137 141  K 4.2 4.5 3.3*  CL 91* 94* 98  CO2 28 25 26   BUN 88* 95* 29*  CREATININE 6.67* 7.70* 3.46*  GLUCOSE 173* 162* 114*   Electrolytes  Recent Labs Lab 08/29/13 0742 08/30/13 0255 08/31/13 0403  CALCIUM 9.9 9.4 9.1   Sepsis Markers  Recent Labs Lab 08/29/13 1428  LATICACIDVEN 2.4*   ABG No results found for this basename: PHART, PCO2ART, PO2ART,  in the last 168 hours Liver Enzymes  Recent Labs Lab 08/29/13 0742  AST 19  ALT 17  ALKPHOS 64  BILITOT 0.3  ALBUMIN 3.2*   Cardiac Enzymes  Recent Labs Lab 08/29/13 2100 08/30/13 0255 08/30/13 0858  TROPONINI <0.30 <0.30 0.34*   Glucose  Recent Labs Lab 08/30/13 1240 08/30/13 1525 08/30/13 2024 08/30/13 2308 08/31/13 0328 08/31/13 0732  GLUCAP 166* 142* 156* 119* 109* 117*    Imaging Dg Chest Port 1 View  08/31/2013   CLINICAL DATA:  Edema.  EXAM: PORTABLE CHEST - 1 VIEW  COMPARISON:  08/29/2013  FINDINGS: The patient is rotated to the right. Right jugular dialysis catheter remains in place with tips overlying the mid and lower SVC. The  cardiac silhouette remains enlarged. Mild interstitial edema is present, which appears slightly worse than on the prior study. No large pleural effusion is identified, however the left costophrenic angle is excluded from the image.  IMPRESSION: Slight worsening of mild interstitial edema.   Electronically Signed   By: Sebastian Ache   On: 08/31/2013 07:52   Dg Chest Port 1 View  08/29/2013   CLINICAL DATA:  Shortness of breath.  EXAM: PORTABLE CHEST - 1 VIEW  COMPARISON:  08/19/2013.  FINDINGS: Dialysis catheter again noted in stable position. Previously identified cardiomegaly and pulmonary vascular congestion remains. The pulmonary vascular congestion has improved from prior study. The previously identified interstitial prominence remains but has improved. No pleural effusion or pneumothorax.  IMPRESSION: Findings  consistent with partial clearing of congestive heart failure with pulmonary interstitial edema.   Electronically Signed   By: Maisie Fus  Register   On: 08/29/2013 12:39     CXR:   ASSESSMENT / PLAN:  PULMONARY A:  Edema mild on pcxr P:   IS Upright Chair Hd in am  CARDIOVASCULAR A: Bradycardia asymptomatic thus far CAD  HTN Pause with egd / sedation self resolved Symptomatic brady last 18 hrs 12/22-23 P:  EP to see, likely needs pacer, would favor earlier in hosp course Dopamine to max 8 mics with beta affect Cortisol Tele Pacer on chest tsh wnl  RENAL A:  ESRD -contact renal as may need HD with extra volume infused today  Last HD 12/19   P:   Per Renal   GASTROINTESTINAL A:  GIB  LA Grade A esophagitis.  2) Hiatal hernia.  3) Antral ulcers with patchy gastritis.  4) Duodenal ulcers with duodenitis  P:   ppi drip to bid x 1 month Advance diet Bx awaited  HEMATOLOGIC A:  Anemia-Hemorrhagic, GI bleed, uremic Pos balance P:  inr wnl Cbc in am scd  INFECTIOUS A:  No apparent acute infection  P:   Tr wbc /temp   ENDOCRINE A:  DM II  P:   SSI   NEUROLOGIC A:  High stroke risk and R. IJ thrombus started on coumadin during recent admission 12/12.  P:   Monitor closely off anticoagulation   TODAY'S SUMMARY: off / on dop, likely needs a pacer, hd in am , cortisol   I have personally obtained a history, examined the patient, evaluated laboratory and imaging results, formulated the assessment and plan and placed orders. CRITICAL CARE: The patient is critically ill with multiple organ systems failure and requires high complexity decision making for assessment and support, frequent evaluation and titration of therapies, application of advanced monitoring technologies and extensive interpretation of multiple databases. Critical Care Time devoted to patient care services described in this note is  30 minutes.     I have fully examined this patient and  agree with above findings.    And edited in full  Mcarthur Rossetti. Tyson Alias, MD, FACP Pgr: [phone] [facility]

## 2013-09-01 LAB — CBC
HCT: 24.9 % — ABNORMAL LOW (ref 36.0–46.0)
Hemoglobin: 8.2 g/dL — ABNORMAL LOW (ref 12.0–15.0)
MCH: 31.9 pg (ref 26.0–34.0)
Platelets: 173 10*3/uL (ref 150–400)
RBC: 2.57 MIL/uL — ABNORMAL LOW (ref 3.87–5.11)
WBC: 10 10*3/uL (ref 4.0–10.5)

## 2013-09-01 LAB — GLUCOSE, CAPILLARY
Glucose-Capillary: 149 mg/dL — ABNORMAL HIGH (ref 70–99)
Glucose-Capillary: 98 mg/dL (ref 70–99)
Glucose-Capillary: 141 mg/dL — ABNORMAL HIGH (ref 70–99)
Glucose-Capillary: 157 mg/dL — ABNORMAL HIGH (ref 70–99)
Glucose-Capillary: 257 mg/dL — ABNORMAL HIGH (ref 70–99)

## 2013-09-01 LAB — RENAL FUNCTION PANEL
Albumin: 3.1 g/dL — ABNORMAL LOW (ref 3.5–5.2)
BUN: 41 mg/dL — ABNORMAL HIGH (ref 6–23)
CO2: 27 mEq/L (ref 19–32)
Calcium: 9.6 mg/dL (ref 8.4–10.5)
Chloride: 99 mEq/L (ref 96–112)
Creatinine, Ser: 5.71 mg/dL — ABNORMAL HIGH (ref 0.50–1.10)
Glucose, Bld: 102 mg/dL — ABNORMAL HIGH (ref 70–99)

## 2013-09-01 MED ORDER — NEPRO/CARBSTEADY PO LIQD
237.0000 mL | ORAL | Status: DC | PRN
  Filled 2013-09-01: qty 237

## 2013-09-01 MED ORDER — LIDOCAINE HCL (PF) 1 % IJ SOLN: 5.0000 mL | INTRAMUSCULAR | Status: DC | PRN

## 2013-09-01 MED ORDER — SODIUM CHLORIDE 0.9 % IV SOLN: 100.0000 mL | INTRAVENOUS | Status: DC | PRN

## 2013-09-01 MED ORDER — LIDOCAINE-PRILOCAINE 2.5-2.5 % EX CREA: 1.0000 "application " | TOPICAL_CREAM | CUTANEOUS | Status: DC | PRN

## 2013-09-01 MED ORDER — HEPARIN SODIUM (PORCINE) 1000 UNIT/ML DIALYSIS: 1000.0000 [IU] | INTRAMUSCULAR | Status: DC | PRN

## 2013-09-01 MED ORDER — ALTEPLASE 2 MG IJ SOLR: 2.0000 mg | Freq: Once | INTRAMUSCULAR | Status: DC | PRN

## 2013-09-01 MED ORDER — HYDROCORTISONE SOD SUCCINATE 100 MG IJ SOLR
50.0000 mg | Freq: Four times a day (QID) | INTRAMUSCULAR | Status: DC
  Administered 2013-09-01 – 2013-09-02 (×3): 50 mg via INTRAVENOUS
  Filled 2013-09-01 (×8): qty 1

## 2013-09-01 MED ORDER — PENTAFLUOROPROP-TETRAFLUOROETH EX AERO: 1.0000 "application " | INHALATION_SPRAY | CUTANEOUS | Status: DC | PRN

## 2013-09-01 MED ORDER — ALTEPLASE 2 MG IJ SOLR: 2.0000 mg | Freq: Once | INTRAMUSCULAR | Status: AC | PRN

## 2013-09-01 MED ORDER — CHLORHEXIDINE GLUCONATE 0.12 % MT SOLN
OROMUCOSAL | Status: AC
  Filled 2013-09-01: qty 15

## 2013-09-01 NOTE — Progress Notes (Signed)
Name: Briana Gould MRN: [id number] DOB: [birth year]    ADMISSION DATE:  08/29/2013  REFERRING MD :  EDP  PRIMARY SERVICE: PCCM  CHIEF COMPLAINT:  GIB /Hypotension   BRIEF PATIENT DESCRIPTION: 67 yo with ESRD on HD recent admit 12/4 with hypotension during HD found to have R. IJ thrombus started on coumadin at discharge.  To ER on 12/21 with bloody diarrhea/vomit  found to be anemic with hgb 7.3.  PCCM to admit to ICU   SIGNIFICANT EVENTS / STUDIES:  12/21- brady, BP wnl 12/22 egd - LA Grade A esophagitis, Hiatal hernia, Antral ulcers with patchy gastritis, Duodenal ulcers with duodenitis 12/22 big pause with egd, sedation, HD 12/23- requiring dopamine 12/23- EP not planning pacer, poor canddiate 12/24- dop remains  LINES / TUBES:  CULTURES:  ANTIBIOTICS:  SUBJECTIVE:  dopamine  VITAL SIGNS: Temp:  [98.7 F (37.1 C)-99 F (37.2 C)] 99 F (37.2 C) (12/24 0000) Pulse Rate:  [51-67] 59 (12/24 0900) Resp:  [10-21] 13 (12/24 0900) BP: (73-184)/(32-130) 123/35 mmHg (12/24 0900) SpO2:  [88 %-100 %] 98 % (12/24 0900) Weight:  [79.6 kg (175 lb 7.8 oz)] 79.6 kg (175 lb 7.8 oz) (12/24 0600) HEMODYNAMICS:   VENTILATOR SETTINGS:   INTAKE / OUTPUT: Intake/Output     12/23 0701 - 12/24 0700 12/24 0701 - 12/25 0700   P.O. 1083    I.V. (mL/kg) 676.2 (8.5) 56.2 (0.7)   Total Intake(mL/kg) 1759.2 (22.1) 56.2 (0.7)   Other     Total Output 0     Net +1759.2 +56.2          PHYSICAL EXAMINATION: General: awake, alert Neuro: nonfocal exam HEENT: jvd wnl PULM: CTA CV: s1 s2 brady rr GI: soft, bs wnl, NT Extremities: left bka   LABS:  CBC  Recent Labs Lab 08/30/13 2055 08/31/13 0955 09/01/13 0352  WBC 14.2* 11.0* 10.0  HGB 8.7* 8.2* 8.2*  HCT 25.6* 24.0* 24.9*  PLT 201 153 173   Coag's  Recent Labs Lab 08/29/13 0742 08/29/13 1427  APTT 35  --   INR 1.60* 1.54*   BMET  Recent Labs Lab 08/30/13 0255 08/31/13 0403 09/01/13 0352  NA 137  141 140  K 4.5 3.3* 4.0  CL 94* 98 99  CO2 25 26 27   BUN 95* 29* 41*  CREATININE 7.70* 3.46* 5.71*  GLUCOSE 162* 114* 102*   Electrolytes  Recent Labs Lab 08/30/13 0255 08/31/13 0403 09/01/13 0352  CALCIUM 9.4 9.1 9.6  PHOS  --   --  4.8*   Sepsis Markers  Recent Labs Lab 08/29/13 1428  LATICACIDVEN 2.4*   ABG No results found for this basename: PHART, PCO2ART, PO2ART,  in the last 168 hours Liver Enzymes  Recent Labs Lab 08/29/13 0742 09/01/13 0352  AST 19  --   ALT 17  --   ALKPHOS 64  --   BILITOT 0.3  --   ALBUMIN 3.2* 3.1*   Cardiac Enzymes  Recent Labs Lab 08/29/13 2100 08/30/13 0255 08/30/13 0858  TROPONINI <0.30 <0.30 0.34*   Glucose  Recent Labs Lab 08/31/13 1149 08/31/13 1554 08/31/13 2037 08/31/13 2357 09/01/13 0348 09/01/13 0717  GLUCAP 141* 155* 189* 149* 98 141*    Imaging Dg Chest Port 1 View  08/31/2013   CLINICAL DATA:  Edema.  EXAM: PORTABLE CHEST - 1 VIEW  COMPARISON:  08/29/2013  FINDINGS: The patient is rotated to the right. Right jugular dialysis catheter remains in place with tips overlying the mid  and lower SVC. The cardiac silhouette remains enlarged. Mild interstitial edema is present, which appears slightly worse than on the prior study. No large pleural effusion is identified, however the left costophrenic angle is excluded from the image.  IMPRESSION: Slight worsening of mild interstitial edema.   Electronically Signed   By: Sebastian Ache   On: 08/31/2013 07:52     CXR: none  ASSESSMENT / PLAN:  PULMONARY A:  Edema mild on pcxr P:   IS Upright Hd again today  CARDIOVASCULAR A: Bradycardia asymptomatic thus far CAD  HTN Pause with egd / sedation self resolved Symptomatic brady last 18 hrs 12/22-23 P:  EP note reviewed Dopamine to off, will accept HR 40 , asymptomatic, will aggressively titrate Cortisol 21, borderline, add stress roids in this circumstances Tele Pacer on chest tsh wnl Would not add  florinef as esrd wont be effective Will d/w renal , role midodrine?  RENAL A:  ESRD -contact renal as may need HD with extra volume infused today  Last HD 12/19   P:   Per Renal , hd today  GASTROINTESTINAL A:  GIB  LA Grade A esophagitis.  2) Hiatal hernia.  3) Antral ulcers with patchy gastritis.  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 IJ thrombus started on coumadin during recent admission 12/12.  P:   Monitor closely off anticoagulation   TODAY'S SUMMARY: will accept HR 40 asympto, this is an issue for her survival outpt as no HD without pacer, may need palliative care discussions   I have personally obtained a history, examined the patient, evaluated laboratory and imaging results, formulated the assessment and plan and placed orders. CRITICAL CARE: The patient is critically ill with multiple organ systems failure and requires high complexity decision making for assessment and support, frequent evaluation and titration of therapies, application of advanced monitoring technologies and extensive interpretation of multiple databases. Critical Care Time devoted to patient care services described in this note is  30 minutes.     I have fully examined this patient and agree with above findings.    And edited in full  Mcarthur Rossetti. Tyson Alias, MD, FACP Pgr: [phone] [facility]

## 2013-09-01 NOTE — Progress Notes (Signed)
Subjective: Interval History: none.  Objective: Vital signs in last 24 hours: Temp:  [98.7 F (37.1 C)-99 F (37.2 C)] 99 F (37.2 C) (12/24 0000) Pulse Rate:  [43-67] 61 (12/24 0630) Resp:  [10-21] 10 (12/24 0630) BP: (73-184)/(27-130) 171/54 mmHg (12/24 0630) SpO2:  [88 %-100 %] 91 % (12/24 0630) Weight:  [79.6 kg (175 lb 7.8 oz)] 79.6 kg (175 lb 7.8 oz) (12/24 0600) Weight change: -3.1 kg (-6 lb 13.4 oz)  Intake/Output from previous day: 12/23 0701 - 12/24 0700 In: 1759.2 [P.O.:1083; I.V.:676.2] Out: 0  Intake/Output this shift:    General appearance: alert, cooperative and mildly obese Resp: clear to auscultation bilaterally Cardio: S1, S2 normal and systolic murmur: holosystolic 2/6, blowing at apex GI: pos bs, obese, liver down 4 cm Extremities: AVF LUA B&T, PC RIJ  Lab Results:  Recent Labs  08/31/13 0955 09/01/13 0352  WBC 11.0* 10.0  HGB 8.2* 8.2*  HCT 24.0* 24.9*  PLT 153 173   BMET:  Recent Labs  08/31/13 0403 09/01/13 0352  NA 141 140  K 3.3* 4.0  CL 98 99  CO2 26 27  GLUCOSE 114* 102*  BUN 29* 41*  CREATININE 3.46* 5.71*  CALCIUM 9.1 9.6   No results found for this basename: PTH,  in the last 72 hours Iron Studies: No results found for this basename: IRON, TIBC, TRANSFERRIN, FERRITIN,  in the last 72 hours  Studies/Results: Dg Chest Port 1 View  08/31/2013   CLINICAL DATA:  Edema.  EXAM: PORTABLE CHEST - 1 VIEW  COMPARISON:  08/29/2013  FINDINGS: The patient is rotated to the right. Right jugular dialysis catheter remains in place with tips overlying the mid and lower SVC. The cardiac silhouette remains enlarged. Mild interstitial edema is present, which appears slightly worse than on the prior study. No large pleural effusion is identified, however the left costophrenic angle is excluded from the image.  IMPRESSION: Slight worsening of mild interstitial edema.   Electronically Signed   By: Sebastian Ache   On: 08/31/2013 07:52    I have  reviewed the patient's current medications.  Assessment/Plan: 1 CRF for HD, use AVF, if ok ,, d/c PC 2 Anemia stable , cont epo 3 AS 4 HPTH  meds 5 Obesity 6 Carotid disease stable 7 GIB Hb stable 8 Bradycardia On DA Not Candidate for outpatient dialysis with this HR issue. Catheter will be gone soon and feel Pacemaker may be mandatory P HD, epo, DA, if HD ok via AVF, get cath out.    LOS: 3 days   Ellie Bryand L 09/01/2013,8:33 AM

## 2013-09-01 NOTE — Procedures (Signed)
I was present at this session.  I have reviewed the session itself and made appropriate changes. Unforrtunately not able to get art needle down, and BH 1"apart so some recirculation.  Will ^ time.  Primary risk is HR  Briana Gould L 12/24/20146:06 PM

## 2013-09-01 NOTE — Progress Notes (Signed)
[name], female   DOB: Jun 04, 1946, 67 y.o.   MRN: [id number] Subjective:  No sob or dizziness  Objective:  Vital Signs in the last 24 hours: Temp:  [98.7 F (37.1 C)-99 F (37.2 C)] 99 F (37.2 C) (12/24 0000) Pulse Rate:  [47-67] 61 (12/24 0630) Resp:  [10-21] 10 (12/24 0630) BP: (73-184)/(27-130) 171/54 mmHg (12/24 0630) SpO2:  [88 %-100 %] 91 % (12/24 0630) Weight:  [175 lb 7.8 oz (79.6 kg)] 175 lb 7.8 oz (79.6 kg) (12/24 0600)  Intake/Output from previous day: 12/23 0701 - 12/24 0700 In: 1759.2 [P.O.:1083; I.V.:676.2] Out: 0  Intake/Output from this shift:    Physical Exam: stable appearing NAD HEENT: Unremarkable Neck:  No JVD, no thyromegally Back:  No CVA tenderness Lungs:  Clear with no wheezes HEART:  Regular rate rhythm, no murmurs, no rubs, no clicks Abd:  Flat, positive bowel sounds, no organomegally, no rebound, no guarding Ext:  2 plus pulses, no edema, no cyanosis, no clubbing, left arm thrill Skin:  No rashes no nodules Neuro:  CN II through XII intact, motor grossly intact  Lab Results:  Recent Labs  08/31/13 0955 09/01/13 0352  WBC 11.0* 10.0  HGB 8.2* 8.2*  PLT 153 173    Recent Labs  08/31/13 0403 09/01/13 0352  NA 141 140  K 3.3* 4.0  CL 98 99  CO2 26 27  GLUCOSE 114* 102*  BUN 29* 41*  CREATININE 3.46* 5.71*    Recent Labs  08/30/13 0255 08/30/13 0858  TROPONINI <0.30 0.34*   Hepatic Function Panel  Recent Labs  09/01/13 0352  ALBUMIN 3.1*   No results found for this basename: CHOL,  in the last 72 hours No results found for this basename: PROTIME,  in the last 72 hours  Imaging: Dg Chest Port 1 View  08/31/2013   CLINICAL DATA:  Edema.  EXAM: PORTABLE CHEST - 1 VIEW  COMPARISON:  08/29/2013  FINDINGS: The patient is rotated to the right. Right jugular dialysis catheter remains in place with tips overlying the mid and lower SVC. The cardiac silhouette remains enlarged. Mild interstitial edema is  present, which appears slightly worse than on the prior study. No large pleural effusion is identified, however the left costophrenic angle is excluded from the image.  IMPRESSION: Slight worsening of mild interstitial edema.   Electronically Signed   By: Sebastian Ache   On: 08/31/2013 07:52    Cardiac Studies: Tele - nsr with sb Assessment/Plan:  1. Sinus bradycardia 2. ESRD on HD Rec: continue to wean dopamine. She is not a good candidate for ppm and i would accept sinus bradycardia with heart rates in the high 40's at rest. Avoid all AV nodal/sinus nodal blocking agents.   Gregg Taylor,M.D. 09/01/2013, 9:01 AM

## 2013-09-02 LAB — GLUCOSE, CAPILLARY
Glucose-Capillary: 139 mg/dL — ABNORMAL HIGH (ref 70–99)
Glucose-Capillary: 160 mg/dL — ABNORMAL HIGH (ref 70–99)
Glucose-Capillary: 189 mg/dL — ABNORMAL HIGH (ref 70–99)
Glucose-Capillary: 130 mg/dL — ABNORMAL HIGH (ref 70–99)

## 2013-09-02 MED ORDER — HYDRALAZINE HCL 50 MG PO TABS
50.0000 mg | ORAL_TABLET | Freq: Three times a day (TID) | ORAL | Status: DC
  Administered 2013-09-02 – 2013-09-05 (×7): 50 mg via ORAL
  Filled 2013-09-02 (×14): qty 1

## 2013-09-02 NOTE — Progress Notes (Signed)
[name], female   DOB: 1946/06/14, 67 y.o.   MRN: [id number] Subjective:  No sob or dizziness  Objective:  Vital Signs in the last 24 hours: Temp:  [98.2 F (36.8 C)-99.1 F (37.3 C)] 98.9 F (37.2 C) (12/25 0800) Pulse Rate:  [50-90] 59 (12/25 0830) Resp:  [11-26] 14 (12/25 0830) BP: (115-172)/(27-117) 161/35 mmHg (12/25 0830) SpO2:  [93 %-100 %] 98 % (12/25 0830) Weight:  [170 lb 10.2 oz (77.4 kg)-180 lb 1.9 oz (81.7 kg)] 175 lb 14.8 oz (79.8 kg) (12/25 0500)  Intake/Output from previous day: 12/24 0701 - 12/25 0700 In: 786.4 [P.O.:720; I.V.:66.4] Out: 3000  Intake/Output from this shift:    Physical Exam: stable appearing NAD HEENT: Unremarkable Neck:  No JVD, no thyromegally Back:  No CVA tenderness Lungs:  Clear with no wheezes HEART:  Regular rate rhythm, no murmurs, no rubs, no clicks Abd:  Flat, positive bowel sounds, no organomegally, no rebound, no guarding Ext:  2 plus pulses, no edema, no cyanosis, no clubbing, left arm thrill Skin:  No rashes no nodules Neuro:  CN II through XII intact, motor grossly intact  Lab Results:  Recent Labs  08/31/13 0955 09/01/13 0352  WBC 11.0* 10.0  HGB 8.2* 8.2*  PLT 153 173    Recent Labs  08/31/13 0403 09/01/13 0352  NA 141 140  K 3.3* 4.0  CL 98 99  CO2 26 27  GLUCOSE 114* 102*  BUN 29* 41*  CREATININE 3.46* 5.71*    Recent Labs  08/30/13 0858  TROPONINI 0.34*   Hepatic Function Panel  Recent Labs  09/01/13 0352  ALBUMIN 3.1*   No results found for this basename: CHOL,  in the last 72 hours No results found for this basename: PROTIME,  in the last 72 hours  Imaging: No results found.  Cardiac Studies: Tele - nsr with sb Assessment/Plan:  1. Sinus bradycardia, now resolved off of Dopamine 2. ESRD on HD Rec: She is improved. Will sign off as she is no longer requiring HD. In the future, PPM could be considered once indwelling catheter is removed if she has disabling  symptoms, off of all Sinus node slowing meds.  Gregg Taylor,M.D. 09/02/2013, 8:55 AM

## 2013-09-02 NOTE — Progress Notes (Signed)
Name: Briana Gould MRN: [id number] DOB: 1945-12-25    ADMISSION DATE:  08/29/2013  REFERRING MD :  EDP  PRIMARY SERVICE: PCCM  CHIEF COMPLAINT:  GIB /Hypotension   BRIEF PATIENT DESCRIPTION: 67 yo with ESRD on HD recent admit 12/4 with hypotension during HD found to have R. IJ thrombus started on coumadin at discharge.  To ER on 12/21 with bloody diarrhea/vomit  found to be anemic with hgb 7.3.  PCCM to admit to ICU   SIGNIFICANT EVENTS / STUDIES:  12/21- brady, BP wnl 12/22 egd - LA Grade A esophagitis, Hiatal hernia, Antral ulcers with patchy gastritis, Duodenal ulcers with duodenitis 12/22 big pause with egd, sedation, HD 12/23- requiring dopamine 12/23- EP not planning pacer, poor canddiate 12/24- dop remains  LINES / TUBES:  CULTURES:  ANTIBIOTICS:  SUBJECTIVE:  Off dopamine Denies cp, dyspnea afebrile  VITAL SIGNS: Temp:  [98.2 F (36.8 C)-99.1 F (37.3 C)] 98.4 F (36.9 C) (12/25 0358) Pulse Rate:  [50-90] 58 (12/25 0600) Resp:  [11-26] 19 (12/25 0600) BP: (115-173)/(30-117) 154/30 mmHg (12/25 0600) SpO2:  [96 %-100 %] 100 % (12/25 0600) Weight:  [77.4 kg (170 lb 10.2 oz)-81.7 kg (180 lb 1.9 oz)] 79.8 kg (175 lb 14.8 oz) (12/25 0500) HEMODYNAMICS:   VENTILATOR SETTINGS:   INTAKE / OUTPUT: Intake/Output     12/24 0701 - 12/25 0700 12/25 0701 - 12/26 0700   P.O. 720    I.V. (mL/kg) 66.4 (0.8)    Total Intake(mL/kg) 786.4 (9.9)    Other 3000    Total Output 3000     Net -2213.7            PHYSICAL EXAMINATION: General: awake, alert Neuro: nonfocal exam HEENT: jvd wnl PULM: CTA CV: s1 s2 brady rr GI: soft, bs wnl, NT Extremities: left bka   LABS:  CBC  Recent Labs Lab 08/30/13 2055 08/31/13 0955 09/01/13 0352  WBC 14.2* 11.0* 10.0  HGB 8.7* 8.2* 8.2*  HCT 25.6* 24.0* 24.9*  PLT 201 153 173   Coag's  Recent Labs Lab 08/29/13 0742 08/29/13 1427  APTT 35  --   INR 1.60* 1.54*   BMET  Recent Labs Lab  08/30/13 0255 08/31/13 0403 09/01/13 0352  NA 137 141 140  K 4.5 3.3* 4.0  CL 94* 98 99  CO2 25 26 27   BUN 95* 29* 41*  CREATININE 7.70* 3.46* 5.71*  GLUCOSE 162* 114* 102*   Electrolytes  Recent Labs Lab 08/30/13 0255 08/31/13 0403 09/01/13 0352  CALCIUM 9.4 9.1 9.6  PHOS  --   --  4.8*   Sepsis Markers  Recent Labs Lab 08/29/13 1428  LATICACIDVEN 2.4*   ABG No results found for this basename: PHART, PCO2ART, PO2ART,  in the last 168 hours Liver Enzymes  Recent Labs Lab 08/29/13 0742 09/01/13 0352  AST 19  --   ALT 17  --   ALKPHOS 64  --   BILITOT 0.3  --   ALBUMIN 3.2* 3.1*   Cardiac Enzymes  Recent Labs Lab 08/29/13 2100 08/30/13 0255 08/30/13 0858  TROPONINI <0.30 <0.30 0.34*   Glucose  Recent Labs Lab 09/01/13 1147 09/01/13 1629 09/01/13 1748 09/01/13 2031 09/01/13 2337 09/02/13 0356  GLUCAP 157* 257* 187* 94 139* 160*    Imaging No results found.   CXR: none  ASSESSMENT / PLAN:  PULMONARY A:  Edema mild on pcxr P:   IS Upright   CARDIOVASCULAR A: Bradycardia asymptomatic thus far CAD  HTN Pause  with egd / sedation self resolved Symptomatic brady 18 hrs 12/22-23 P:  EP note reviewed Dopamine  off, will accept HR 40 , asymptomatic, will aggressively titrate Cortisol 21, borderline, add stress roids in this circumstances Bp improved    RENAL A:  ESRD -contact renal as may need HD with extra volume infused today  Last HD 12/19   P:   Per Renal , hd ? Remove HD cath   GASTROINTESTINAL A:  GIB  LA Grade A esophagitis.  2) Hiatal hernia.  3) Antral ulcers with patchy gastritis.  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 IJ thrombus started on coumadin during recent admission  12/12.  P:   Monitor closely off anticoagulation   TODAY'S SUMMARY: transfer to tele, defer to EP re: pacer decision      Cyril Mourning MD. [facility]. [facility] Pager [phone] If no response call [phone]

## 2013-09-02 NOTE — Progress Notes (Signed)
Subjective: Interval History: has no complaint, did well on HD.  Objective: Vital signs in last 24 hours: Temp:  [98.2 F (36.8 C)-99.1 F (37.3 C)] 98.4 F (36.9 C) (12/25 0358) Pulse Rate:  [50-90] 56 (12/25 0730) Resp:  [11-26] 14 (12/25 0730) BP: (115-173)/(27-117) 161/27 mmHg (12/25 0730) SpO2:  [93 %-100 %] 98 % (12/25 0730) Weight:  [77.4 kg (170 lb 10.2 oz)-81.7 kg (180 lb 1.9 oz)] 79.8 kg (175 lb 14.8 oz) (12/25 0500) Weight change: 2.1 kg (4 lb 10.1 oz)  Intake/Output from previous day: 12/24 0701 - 12/25 0700 In: 786.4 [P.O.:720; I.V.:66.4] Out: 3000  Intake/Output this shift:    General appearance: alert, cooperative and moderately obese Neck: RIJ cath Resp: clear to auscultation bilaterally Cardio: S1, S2 normal and systolic murmur: holosystolic 2/6, blowing at apex GI: soft, pos bs, obese, liver down 4 cm Extremities: AVF LUA  Lab Results:  Recent Labs  08/31/13 0955 09/01/13 0352  WBC 11.0* 10.0  HGB 8.2* 8.2*  HCT 24.0* 24.9*  PLT 153 173   BMET:  Recent Labs  08/31/13 0403 09/01/13 0352  NA 141 140  K 3.3* 4.0  CL 98 99  CO2 26 27  GLUCOSE 114* 102*  BUN 29* 41*  CREATININE 3.46* 5.71*  CALCIUM 9.1 9.6   No results found for this basename: PTH,  in the last 72 hours Iron Studies: No results found for this basename: IRON, TIBC, TRANSFERRIN, FERRITIN,  in the last 72 hours  Studies/Results: No results found.  I have reviewed the patient's current medications.  Assessment/Plan: 1  CRF did well with long HD, slow UFR 2 Bradycardia feel this is the cause of cerebral hypoperfusion, and cause of arrest is setting of CVD 3 Anemia stable 4 HPTH 5 GIB stable 6 DM controlled 7Access can get cath out P HD, aranesp, get cath out, ??? Pacer    LOS: 4 days   Tkeyah Burkman L 09/02/2013,7:58 AM

## 2013-09-03 ENCOUNTER — Inpatient Hospital Stay (HOSPITAL_COMMUNITY): Payer: Medicare Other

## 2013-09-03 DIAGNOSIS — D631 Anemia in chronic kidney disease: Secondary | ICD-10-CM

## 2013-09-03 DIAGNOSIS — N039 Chronic nephritic syndrome with unspecified morphologic changes: Secondary | ICD-10-CM

## 2013-09-03 DIAGNOSIS — N189 Chronic kidney disease, unspecified: Secondary | ICD-10-CM

## 2013-09-03 DIAGNOSIS — R001 Bradycardia, unspecified: Secondary | ICD-10-CM

## 2013-09-03 LAB — CBC
MCH: 32.6 pg (ref 26.0–34.0)
Platelets: 143 10*3/uL — ABNORMAL LOW (ref 150–400)
RBC: 2.3 MIL/uL — ABNORMAL LOW (ref 3.87–5.11)
RDW: 16 % — ABNORMAL HIGH (ref 11.5–15.5)
WBC: 9 10*3/uL (ref 4.0–10.5)

## 2013-09-03 LAB — GLUCOSE, CAPILLARY
Glucose-Capillary: 195 mg/dL — ABNORMAL HIGH (ref 70–99)
Glucose-Capillary: 135 mg/dL — ABNORMAL HIGH (ref 70–99)
Glucose-Capillary: 124 mg/dL — ABNORMAL HIGH (ref 70–99)

## 2013-09-03 LAB — BASIC METABOLIC PANEL
Calcium: 9 mg/dL (ref 8.4–10.5)
GFR calc non Af Amer: 7 mL/min — ABNORMAL LOW (ref >90)
Sodium: 139 mEq/L (ref 135–145)

## 2013-09-03 MED ORDER — CHLORHEXIDINE GLUCONATE 4 % EX LIQD
CUTANEOUS | Status: AC
  Filled 2013-09-03: qty 45

## 2013-09-03 MED ORDER — INSULIN ASPART 100 UNIT/ML ~~LOC~~ SOLN
0.0000 [IU] | Freq: Three times a day (TID) | SUBCUTANEOUS | Status: DC
  Administered 2013-09-03: 3 [IU] via SUBCUTANEOUS
  Administered 2013-09-04 (×3): 2 [IU] via SUBCUTANEOUS

## 2013-09-03 MED ORDER — FAMOTIDINE 40 MG PO TABS
40.0000 mg | ORAL_TABLET | Freq: Every day | ORAL | Status: DC
  Administered 2013-09-03 – 2013-09-04 (×2): 40 mg via ORAL
  Filled 2013-09-03 (×3): qty 1

## 2013-09-03 MED ORDER — PANTOPRAZOLE SODIUM 40 MG PO TBEC: 40.0000 mg | DELAYED_RELEASE_TABLET | Freq: Two times a day (BID) | ORAL | Status: DC

## 2013-09-03 NOTE — Procedures (Signed)
Successful removal of rt IJ tunneled HD catheter. No complications.  Brayton El PA-C Interventional Radiology 09/03/2013 1:10 PM

## 2013-09-03 NOTE — Discharge Summary (Signed)
Service  Discharge Summary       [name] MRN: [id number] DOB/AGE: [birth year] 67 y.o.  Admit date: 08/29/2013 Discharge date: 09/03/2013  Discharge Diagnoses:  Active Problems:   DM   ESRD (end stage renal disease) on dialysis   GIB (gastrointestinal bleeding)   Anemia of chronic renal failure   Secondary hyperparathyroidism   Bradycardia   Detailed Hospital Course:   67 yo with ESRD on HD recently discharged 08/20/13 for hypotension during HD. Transferred to ICU 12/06 after recurrent hypotension during HD with possible cardiac arrest requiring brief CPR. She was found to have right IJ thrombus. MRI of the brain done and showed tiny area of altered signal intensity left motor strip. Neurology consulted And recommended anticoagulation with IV heparin and coumadin. Discharged on Lovenox/coumadin bridge . Unclear what INR has been running since discharge . On arrival 12/21 , INR 1.6. Pt reported she has had increased weakness x 24hr . Developed dark-black tarry stools last pm with hematemesis this am . Coffee grounds material noted On arrival to ER , hbg down from 10.0 to 7.3 . Hemocult stool positive . Pt started on FFP . And Protonix drip . GI consulted for plans for endoccopy. PCCM asked to admit to ICU. Pt b/p 120 systolic . HR have been labile in 40-50s.   Was seen by cardiology for concern about need for pacemaker. Was felt to be poor candidate due to infection risk as she has indwelling HD cath and AV fistula. She was treated w/ dopamine gtt in the ICU to assist with HR and hemodynamics.  Was seen by GI service in ICU. Underwent EGD on 12/22 at bedside which showed: 1) LA Grade A esophagitis. 2) Hiatal hernia. 3) Antral ulcers with patchy gastritis. 4) Duodenal ulcers with duodenitis. She was monitored in the ICU s/p EGD. Her dopamine was weaned to off on 12/25 and she was eventually moved to the medical ward. She remained hemodynamically stable with only minimal  drift in her Hgb. She underwent HD on 12/26 and tolerated this well. She was deemed stable for discharge following HD with the plan as outlined below.     Discharge Plan by diagnoses  GIB: LA Grade A esophagitis. Hiatal hernia. Antral ulcers with patchy gastritis. Duodenal ulcers with duodenitis  P:  No more coumadin ppi to bid (changed due to nausea w/ protonix) F/u GI F/u bx results out-pt   Anemia-Hemorrhagic, GI bleed, uremic  Pos balance  P:  No more coumadin  F/u cbc out pt   Bradycardia asymptomatic thus far  CAD  HTN  Pause with egd / sedation self resolved  Symptomatic brady 18 hrs 12/22-23->resolved  P:  EP note reviewed, see above, deemed poor candidate for perm pacemaker   ESRD -getting HD per renal  P:  F/u w/ renal after d/c  They will arrange cath removal   DM  P:  Resume home meds   High stroke risk and R. IJ thrombus started on coumadin during recent admission 12/12.  P:  Monitor closely off anticoagulation  Not candidate for anticoagulation    Significant Hospital tests/ studies/ interventions and procedures  Consults: nephrology (colodanado), Elnoria Howard (GI), Ladona Ridgel (EP) SIGNIFICANT EVENTS / STUDIES:  12/21- brady, BP wnl  12/22 egd - LA Grade A esophagitis, Hiatal hernia, Antral ulcers with patchy gastritis, Duodenal ulcers with duodenitis  12/22 big pause with egd, sedation, HD  12/23- requiring dopamine  12/23- EP not planning pacer, poor canddiate  12/24- dop remains  Discharge Exam: BP 128/36  Pulse 51  Temp(Src) 97.8 F (36.6 C) (Oral)  Resp 18  Ht 5\' 6"  (1.676 m)  Wt 81.33 kg (179 lb 4.8 oz)  BMI 28.95 kg/m2  SpO2 100%  PHYSICAL EXAMINATION:  General: awake, alert  Neuro: nonfocal exam  HEENT: jvd wnl  PULM: CTA  CV: s1 s2 brady rr  GI: soft, bs wnl, NT  Extremities: left bka   Labs at discharge Lab Results  Component Value Date   CREATININE 5.47* 09/03/2013   BUN 34* 09/03/2013   NA 139 09/03/2013   K 3.4* 09/03/2013    CL 97 09/03/2013   CO2 28 09/03/2013   Lab Results  Component Value Date   WBC 9.0 09/03/2013   HGB 7.5* 09/03/2013   HCT 22.3* 09/03/2013   MCV 97.0 09/03/2013   PLT 143* 09/03/2013   Lab Results  Component Value Date   ALT 17 08/29/2013   AST 19 08/29/2013   ALKPHOS 64 08/29/2013   BILITOT 0.3 08/29/2013   Lab Results  Component Value Date   INR 1.54* 08/29/2013   INR 1.60* 08/29/2013   INR 1.29 08/20/2013    Current radiology studies No results found.  Disposition:  01-Home or Self Care   . darbepoetin  200 mcg Intravenous Q Mon-HD  . hydrALAZINE  50 mg Oral TID  . insulin aspart  0-15 Units Subcutaneous TID AC  . pantoprazole  40 mg Oral BID AC   Follow-up Information   Follow up with Theda Belfast, MD.   Specialty:  Gastroenterology   Contact information:   [address] [place] [address] [phone]       Follow up with keep your scheduled HD appointment which will be sunday .      Discharged Condition: good  Physician Statement:   The Patient was personally examined, the discharge assessment and plan has been personally reviewed and I agree with ACNP Samanthan Dugo's assessment and plan. > 30 minutes of time have been dedicated to discharge assessment, planning and discharge instructions.   Signed: Sheana Bir,PETE 09/03/2013, 10:31 AM

## 2013-09-03 NOTE — Progress Notes (Signed)
Name: Briana Gould MRN: [id number] DOB: 12/15/1945    ADMISSION DATE:  08/29/2013  REFERRING MD :  EDP  PRIMARY SERVICE: PCCM  CHIEF COMPLAINT:  GIB /Hypotension   BRIEF PATIENT DESCRIPTION: 67 yo with ESRD on HD recent admit 12/4 with hypotension during HD found to have R. IJ thrombus started on coumadin at discharge.  To ER on 12/21 with bloody diarrhea/vomit  found to be anemic with hgb 7.3.  PCCM to admit to ICU   SIGNIFICANT EVENTS / STUDIES:  12/21- brady, BP wnl 12/22 egd - LA Grade A esophagitis, Hiatal hernia, Antral ulcers with patchy gastritis, Duodenal ulcers with duodenitis 12/22 big pause with egd, sedation, HD 12/23- requiring dopamine 12/23- EP not planning pacer, poor canddiate 12/24- dop remains  LINES / TUBES:  CULTURES:  ANTIBIOTICS:  SUBJECTIVE:  Feels well/ wants to go home   VITAL SIGNS: Temp:  [97.8 F (36.6 C)-99.3 F (37.4 C)] 97.8 F (36.6 C) (12/26 0818) Pulse Rate:  [48-66] 51 (12/26 0818) Resp:  [16-18] 18 (12/26 0818) BP: (128-169)/(36-82) 128/36 mmHg (12/26 0818) SpO2:  [98 %-100 %] 100 % (12/26 0818) Weight:  [81.33 kg (179 lb 4.8 oz)] 81.33 kg (179 lb 4.8 oz) (12/25 2028) HEMODYNAMICS:   VENTILATOR SETTINGS:   INTAKE / OUTPUT: Intake/Output     12/25 0701 - 12/26 0700 12/26 0701 - 12/27 0700   P.O. 650    I.V. (mL/kg)     Total Intake(mL/kg) 650 (8)    Other     Total Output 0     Net +650            PHYSICAL EXAMINATION: General: awake, alert Neuro: nonfocal exam HEENT: jvd wnl PULM: CTA CV: s1 s2 brady rr GI: soft, bs wnl, NT Extremities: left bka   LABS:  Recent Labs Lab 08/31/13 0403 09/01/13 0352 09/03/13 0523  NA 141 140 139  K 3.3* 4.0 3.4*  CL 98 99 97  CO2 26 27 28   BUN 29* 41* 34*  CREATININE 3.46* 5.71* 5.47*  GLUCOSE 114* 102* 168*    Recent Labs Lab 08/31/13 0955 09/01/13 0352 09/03/13 0523  HGB 8.2* 8.2* 7.5*  HCT 24.0* 24.9* 22.3*  WBC 11.0* 10.0 9.0  PLT 153 173  143*    CXR: none  ASSESSMENT / PLAN:   Edema mild on pcxr P:   IS   Bradycardia asymptomatic thus far CAD  HTN Pause with egd / sedation self resolved Symptomatic brady 18 hrs 12/22-23->resolved  P:  EP note reviewed no plans for pacer Cortisol 21, borderline, but stable hemodynamically so will d/c  Bp improved  ESRD -getting HD per renal  P:   Per Renal , hd later today  GIB  LA Grade A esophagitis.  Hiatal hernia.  Antral ulcers with patchy gastritis.  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 IJ thrombus started on coumadin during recent admission 12/12.  P:   Monitor closely off anticoagulation   TODAY'S SUMMARY: 67 y.o.F , very high risk pt, getting catheter removed today, needs HD today, has yet to ambulate, has one leg and has not tried her prosthesis, cannot take/tolerate PPIs, GIB, no pacer but high risk off anticoag with RV thrombus but cannot get anticoagulation.  Just way too many loose ends to d/c to home Late today after HD.  Needs the weekend until next week to sort all of this out.  Ask [facility] to assume medical care and PCCM will be OFF  Caryl Bis  [phone]  Cell  [phone]  If no response or cell goes to voicemail, call beeper [phone]

## 2013-09-03 NOTE — Progress Notes (Signed)
[name], female   DOB: 1945/12/07, 67 y.o.   MRN: [id number]  [facility] Progress Note    Subjective:   Feels much better today and plans on going home later.   Objective:   BP 128/36  Pulse 51  Temp(Src) 97.8 F (36.6 C) (Oral)  Resp 18  Ht 5\' 6"  (1.676 m)  Wt 81.33 kg (179 lb 4.8 oz)  BMI 28.95 kg/m2  SpO2 100%  Intake/Output: I/O last 3 completed shifts: In: 890 [P.O.:890] Out: 3000 [Other:3000]   Intake/Output this shift:    Weight change: -0.37 kg (-13.1 oz)  Physical Exam: Gen:WD WN in NAD WUJ:[id number] at 51, II/VI sem Resp:cta AOZ:HYQMVH Ext:no edema, LUE AVF +T/B  Labs: BMET  Recent Labs Lab 08/29/13 0742 08/30/13 0255 08/31/13 0403 09/01/13 0352 09/03/13 0523  NA 138 137 141 140 139  K 4.2 4.5 3.3* 4.0 3.4*  CL 91* 94* 98 99 97  CO2 28 25 26 27 28   GLUCOSE 173* 162* 114* 102* 168*  BUN 88* 95* 29* 41* 34*  CREATININE 6.67* 7.70* 3.46* 5.71* 5.47*  ALBUMIN 3.2*  --   --  3.1*  --   CALCIUM 9.9 9.4 9.1 9.6 9.0  PHOS  --   --   --  4.8*  --    CBC  Recent Labs Lab 08/29/13 0742  08/30/13 2055 08/31/13 0955 09/01/13 0352 09/03/13 0523  WBC 12.3*  < > 14.2* 11.0* 10.0 9.0  NEUTROABS 9.8*  --   --   --   --   --   HGB 7.3*  < > 8.7* 8.2* 8.2* 7.5*  HCT 21.9*  < > 25.6* 24.0* 24.9* 22.3*  MCV 94.8  < > 93.8 95.2 96.9 97.0  PLT 193  < > 201 153 173 143*  < > = values in this interval not displayed.  @IMGRELPRIORS @ Medications:    . darbepoetin  200 mcg Intravenous Q Mon-HD  . hydrALAZINE  50 mg Oral TID  . insulin aspart  0-15 Units Subcutaneous TID AC  . pantoprazole  40 mg Oral BID AC     Assessment/ Plan:   1. GIB: Hgb stabilized. No coumadin and follow 2. Bradycardia: ?SSS. EP to f/u as an outpt 3. ESRD: cont with MWF schedule (holiday Sun, Tues, Fri). HD today and d/c afterwards 4. Anemia:stabilizing. Cont with eSa 5. Vascular access: LUE AVF being used successfully x 4. Will d/c  PC in case she would require pacemaker in future 6. Nutrition:stable 7. Hypertension:stable 8. Dispo- for d/c today after HD.   Fabrizio Filip A 09/03/2013, 10:20 AM

## 2013-09-03 NOTE — Care Management Note (Signed)
   CARE MANAGEMENT NOTE 09/03/2013  Patient:  Briana Gould, Briana Gould   Account Number:  [id number]  Date Initiated:  09/03/2013  Documentation initiated by:  Calle Schader  Subjective/Objective Assessment:   Orders for HHPT     Action/Plan:   Met with pt who is active with [facility], she wishes to continue with [facility]. HHPT will be added to current [facility] services. [facility] notified.   Anticipated DC Date:  09/03/2013   Anticipated DC Plan:  HOME W HOME HEALTH SERVICES         Choice offered to / List presented to:             Status of service:  Completed, signed off Medicare Important Message given?   (If response is "NO", the following Medicare IM given date fields will be blank) Date Medicare IM given:   Date Additional Medicare IM given:    Discharge Disposition:  HOME W HOME HEALTH SERVICES  Per UR Regulation:    If discussed at Long Length of Stay Meetings, dates discussed:    Comments:

## 2013-09-04 LAB — CBC
HCT: 28 % — ABNORMAL LOW (ref 36.0–46.0)
Hemoglobin: 9.2 g/dL — ABNORMAL LOW (ref 12.0–15.0)
MCH: 31 pg (ref 26.0–34.0)
MCHC: 32.9 g/dL (ref 30.0–36.0)
MCV: 94.3 fL (ref 78.0–100.0)
RDW: 17.4 % — ABNORMAL HIGH (ref 11.5–15.5)
WBC: 8.9 10*3/uL (ref 4.0–10.5)

## 2013-09-04 LAB — PREPARE RBC (CROSSMATCH)

## 2013-09-04 LAB — GLUCOSE, CAPILLARY
Glucose-Capillary: 138 mg/dL — ABNORMAL HIGH (ref 70–99)
Glucose-Capillary: 141 mg/dL — ABNORMAL HIGH (ref 70–99)
Glucose-Capillary: 167 mg/dL — ABNORMAL HIGH (ref 70–99)

## 2013-09-04 NOTE — Progress Notes (Signed)
[facility] PROGRESS NOTE Interim History: 67 yo with ESRD on HD recent admit 12/4 with hypotension during HD found to have R. IJ thrombus started on coumadin at discharge.     Assessment/Plan: Acute upper *GIB (gastrointestinal bleeding) - s/p EGD 12.22.2014 Grade A esophagitis. Hiatal hernia. Antral ulcers with patchy gastritis - no further coumadin due to high of bleeding. - H2 blocker BID , complained of abd pain with PPI. - Remains a high risk for stroke, but higher risk of GI bleeding due to ulcer. - drift in Hbg transfuse additional unit 12.27.2014 check CBC post-transfusion.  Bradycardia asymptomatic/Pause with egd / sedation self resolved/Symptomatic brady 18 hrs 12/22-23 - Resolved, feel the cause of cerebral hypoperfusion, and cause of arrest is setting of CVD - EP note reviewed no plans for pacer, poor candidate for perm pacer. - Cortisol 21, borderline, but stable hemodynamically so will d/c steroids.  Anemia of chronic renal failure and upper gi bleed: - mild drift down. - transfuse 1 unit.  ESRD (end stage renal disease) on dialysis - HD cath removed no further hd needed.  DM: - SSI.    Code Status: full Family Communication: daughter  Disposition Plan: inpatient   Consultants:  PCCM  Procedures:  EGD  Antibiotics:  None  HPI/Subjective: No complains  Objective: Filed Vitals:   09/03/13 1831 09/03/13 1900 09/03/13 2107 09/04/13 0406  BP: 130/53  148/77 158/75  Pulse: 67  71 60  Temp: 98.5 F (36.9 C)  98.9 F (37.2 C) 99.1 F (37.3 C)  TempSrc: Oral  Oral Oral  Resp: 16  17 18   Height:      Weight:  80.5 kg (177 lb 7.5 oz) 78.019 kg (172 lb)   SpO2: 100%  100% 98%    Intake/Output Summary (Last 24 hours) at 09/04/13 1032 Last data filed at 09/03/13 1831  Gross per 24 hour  Intake      0 ml  Output   1045 ml  Net  -1045 ml   Filed Weights   09/03/13 1348 09/03/13 1900 09/03/13 2107  Weight: 81.9 kg (180 lb 8.9 oz) 80.5  kg (177 lb 7.5 oz) 78.019 kg (172 lb)    Exam:  General: Alert, awake, oriented x3, in no acute distress.  HEENT: No bruits, no goiter.  Heart: Regular rate and rhythm, without murmurs, rubs, gallops.  Lungs: Good air movement, clear to auscultation Abdomen: Soft, nontender, nondistended, positive bowel sounds.    Data Reviewed: Basic Metabolic Panel:  Recent Labs Lab 08/29/13 0742 08/30/13 0255 08/31/13 0403 09/01/13 0352 09/03/13 0523  NA 138 137 141 140 139  K 4.2 4.5 3.3* 4.0 3.4*  CL 91* 94* 98 99 97  CO2 28 25 26 27 28   GLUCOSE 173* 162* 114* 102* 168*  BUN 88* 95* 29* 41* 34*  CREATININE 6.67* 7.70* 3.46* 5.71* 5.47*  CALCIUM 9.9 9.4 9.1 9.6 9.0  PHOS  --   --   --  4.8*  --    Liver Function Tests:  Recent Labs Lab 08/29/13 0742 09/01/13 0352  AST 19  --   ALT 17  --   ALKPHOS 64  --   BILITOT 0.3  --   PROT 6.7  --   ALBUMIN 3.2* 3.1*   No results found for this basename: LIPASE, AMYLASE,  in the last 168 hours No results found for this basename: AMMONIA,  in the last 168 hours CBC:  Recent Labs Lab 08/29/13 0742  08/30/13 0859 08/30/13 2055  08/31/13 0955 09/01/13 0352 09/03/13 0523  WBC 12.3*  < > 11.8* 14.2* 11.0* 10.0 9.0  NEUTROABS 9.8*  --   --   --   --   --   --   HGB 7.3*  < > 7.7* 8.7* 8.2* 8.2* 7.5*  HCT 21.9*  < > 22.8* 25.6* 24.0* 24.9* 22.3*  MCV 94.8  < > 93.1 93.8 95.2 96.9 97.0  PLT 193  < > 157 201 153 173 143*  < > = values in this interval not displayed. Cardiac Enzymes:  Recent Labs Lab 08/29/13 0813 08/29/13 1408 08/29/13 2100 08/30/13 0255 08/30/13 0858  TROPONINI <0.30 <0.30 <0.30 <0.30 0.34*   BNP (last 3 results) No results found for this basename: PROBNP,  in the last 8760 hours CBG:  Recent Labs Lab 09/03/13 1150 09/03/13 1422 09/03/13 1814 09/03/13 2103 09/04/13 0751  GLUCAP 135* 143* 91 124* 138*    Recent Results (from the past 240 hour(s))  MRSA PCR SCREENING     Status: None    Collection Time    08/29/13 12:55 PM      Result Value Range Status   MRSA by PCR NEGATIVE  NEGATIVE Final   Comment:            The GeneXpert MRSA Assay (FDA     approved for NASAL specimens     only), is one component of a     comprehensive MRSA colonization     surveillance program. It is not     intended to diagnose MRSA     infection nor to guide or     monitor treatment for     MRSA infections.     Studies: Ir Removal Tun Cv Cath W/o Fl  09/03/2013   CLINICAL DATA:  Patient with end-stage renal disease, functional arteriovenous fistula. Request HD catheter removal  EXAM: TUNNELED HEMODIALYSIS CATHETER REMOVAL:  TECHNIQUE: Overlying skin prepped with chlorhexidine, draped in usual sterile fashion, infiltrated locally with 1% lidocaine. The previously placed right internal jugular hemodialysis catheter was dissected free from the underlying soft tissues and removed intact. Hemostasis was achieved. Site covered with a sterile dressing. The patient tolerated the procedure well, without any complication.  IMPRESSION: 1. Technically successful tunneled hemodialysis catheter removal. Read by: Brayton El PA-C   Electronically Signed   By: Maryclare Bean M.D.   On: 09/03/2013 13:12    Scheduled Meds: . darbepoetin  200 mcg Intravenous Q Mon-HD  . famotidine  40 mg Oral Daily  . hydrALAZINE  50 mg Oral TID  . insulin aspart  0-15 Units Subcutaneous TID AC   Continuous Infusions:    Marinda Elk  [facility] Pager [phone]. If 8PM-8AM, please contact night-coverage at [email or web address], password [facility] 09/04/2013, 10:32 AM  LOS: 6 days

## 2013-09-05 DIAGNOSIS — I739 Peripheral vascular disease, unspecified: Secondary | ICD-10-CM

## 2013-09-05 DIAGNOSIS — N2581 Secondary hyperparathyroidism of renal origin: Secondary | ICD-10-CM

## 2013-09-05 DIAGNOSIS — E119 Type 2 diabetes mellitus without complications: Secondary | ICD-10-CM

## 2013-09-05 LAB — RENAL FUNCTION PANEL
Albumin: 3 g/dL — ABNORMAL LOW (ref 3.5–5.2)
BUN: 37 mg/dL — ABNORMAL HIGH (ref 6–23)
CO2: 26 mEq/L (ref 19–32)
Calcium: 9 mg/dL (ref 8.4–10.5)
Chloride: 94 mEq/L — ABNORMAL LOW (ref 96–112)
Creatinine, Ser: 5.44 mg/dL — ABNORMAL HIGH (ref 0.50–1.10)
GFR calc non Af Amer: 7 mL/min — ABNORMAL LOW (ref >90)

## 2013-09-05 LAB — TYPE AND SCREEN
ABO/RH(D): O POS
Antibody Screen: NEGATIVE

## 2013-09-05 LAB — CBC
HCT: 26.8 % — ABNORMAL LOW (ref 36.0–46.0)
MCH: 31.3 pg (ref 26.0–34.0)
MCV: 93.1 fL (ref 78.0–100.0)
Platelets: 150 10*3/uL (ref 150–400)
RDW: 17.4 % — ABNORMAL HIGH (ref 11.5–15.5)

## 2013-09-05 LAB — GLUCOSE, CAPILLARY: Glucose-Capillary: 146 mg/dL — ABNORMAL HIGH (ref 70–99)

## 2013-09-05 MED ORDER — FAMOTIDINE 40 MG PO TABS: 40.0000 mg | ORAL_TABLET | Freq: Every day | ORAL | Status: DC

## 2013-09-05 MED ORDER — AMLODIPINE BESYLATE 5 MG PO TABS
5.0000 mg | ORAL_TABLET | Freq: Every day | ORAL | Status: DC
  Filled 2013-09-05: qty 1

## 2013-09-05 MED ORDER — AMLODIPINE BESYLATE 5 MG PO TABS: 5.0000 mg | ORAL_TABLET | Freq: Every day | ORAL | Status: DC

## 2013-09-05 NOTE — Evaluation (Signed)
Physical Therapy Evaluation Patient Details Name: Briana Gould MRN: [id number] DOB: 04-Sep-1946 Today's Date: 09/05/2013 Time: 8413-2440 PT Time Calculation (min): 18 min  PT Assessment / Plan / Recommendation History of Present Illness  67 yo with ESRD on HD recent admit 12/4 with hypotension during HD found to have R. IJ thrombus started on coumadin at discharge.   Patient now admitted with upper GIB, anemia, and bradycardia.  Clinical Impression  Patient presents with problems listed below.  Patient able to ambulate 60' with RW and min guard assist.  Has equipment needed.  Patient safe to discharge home - uses w/c to get into house and around house.  Recommend HHPT be continued at discharge for continued therapy.    PT Assessment  All further PT needs can be met in the next venue of care    Follow Up Recommendations  Home health PT;Supervision - Intermittent    Does the patient have the potential to tolerate intense rehabilitation      Barriers to Discharge        Equipment Recommendations  None recommended by PT    Recommendations for Other Services     Frequency      Precautions / Restrictions Precautions Precautions: None Precaution Comments: Uses prosthesis on LLE Restrictions Weight Bearing Restrictions: No   Pertinent Vitals/Pain       Mobility  Bed Mobility Bed Mobility: Supine to Sit;Sitting - Scoot to Edge of Bed Supine to Sit: 7: Independent;HOB flat Sitting - Scoot to Edge of Bed: 7: Independent Transfers Transfers: Sit to Stand;Stand to Sit Sit to Stand: 5: Supervision;With upper extremity assist;From bed Stand to Sit: 5: Supervision;With upper extremity assist;With armrests;To chair/3-in-1 Details for Transfer Assistance: Patient able to don prosthesis independently.  Assist for safety only.  Good balance in standing with RW. Ambulation/Gait Ambulation/Gait Assistance: 4: Min guard Ambulation Distance (Feet): 28 Feet Assistive device:  Rolling walker Ambulation/Gait Assistance Details: Slow steady gait with RW.  Good posture with gait. Gait Pattern: Step-through pattern;Decreased stance time - left;Decreased step length - right Gait velocity: Slow    Exercises     PT Diagnosis: Difficulty walking;Abnormality of gait  PT Problem List: Decreased activity tolerance;Decreased balance;Decreased mobility;Decreased knowledge of use of DME PT Treatment Interventions:       PT Goals(Current goals can be found in the care plan section)    Visit Information  Last PT Received On: 09/05/13 Assistance Needed: +1 History of Present Illness: 67 yo with ESRD on HD recent admit 12/4 with hypotension during HD found to have R. IJ thrombus started on coumadin at discharge.   Patient now admitted with upper GIB, anemia, and bradycardia.       Prior Functioning  Home Living Family/patient expects to be discharged to:: Private residence Living Arrangements: Children Available Help at Discharge: Family;Available PRN/intermittently Type of Home: House Home Access: Ramped entrance Home Layout: Able to live on main level with bedroom/bathroom Home Equipment: Walker - 2 wheels;Cane - single point;Wheelchair - manual;Shower seat Prior Function Level of Independence: Independent with assistive device(s);Needs assistance ADL's / Homemaking Assistance Needed: Assist with meal prep.  Children complete housekeeping. Comments: Patient drives Communication Communication: No difficulties    Cognition  Cognition Arousal/Alertness: Awake/alert Behavior During Therapy: WFL for tasks assessed/performed Overall Cognitive Status: Within Functional Limits for tasks assessed    Extremity/Trunk Assessment Upper Extremity Assessment Upper Extremity Assessment: Overall WFL for tasks assessed Lower Extremity Assessment Lower Extremity Assessment: Overall WFL for tasks assessed;LLE deficits/detail LLE Deficits / Details: Patient  with BKA.  Uses  prosthesis Cervical / Trunk Assessment Cervical / Trunk Assessment: Normal   Balance Balance Balance Assessed: Yes Static Standing Balance Static Standing - Balance Support: Bilateral upper extremity supported Static Standing - Level of Assistance: 5: Stand by assistance Static Standing - Comment/# of Minutes: 2  End of Session PT - End of Session Equipment Utilized During Treatment: Gait belt Activity Tolerance: Patient tolerated treatment well Patient left: in chair;with call bell/phone within reach Nurse Communication: Mobility status (Request for bath set-up)  GP     Vena Austria 09/05/2013, 2:32 PM Durenda Hurt. Renaldo Fiddler, [facility] Pager [phone]

## 2013-09-05 NOTE — Progress Notes (Signed)
[facility]                                                                                Patient Demographics  Briana Gould, is a 67 y.o. female, DOB - [birth year], RUE:[id number]  Admit date - 08/29/2013   Admitting Physician Nelda Bucks, MD  Outpatient Primary MD for the patient is [name], MD  LOS - 7   Chief Complaint  Patient presents with  . GI Bleeding        Assessment & Plan    Principal Problem:   GIB (gastrointestinal bleeding) Active Problems:   DM   ESRD (end stage renal disease) on dialysis   Anemia of chronic renal failure   Secondary hyperparathyroidism   Bradycardia  Acute upper *GIB (gastrointestinal bleeding)  - s/p EGD 12.22.2014 Grade A esophagitis. Hiatal hernia. Antral ulcers with patchy gastritis  - no further coumadin due to high of bleeding.  - H2 blocker BID , complained of abd pain with PPI. - Remains a high risk for stroke, but higher risk of GI bleeding due to ulcer.  - drift in Hbg transfuse additional unit 12.27.2014  -h/H has remained stable  Bradycardia asymptomatic/Pause with egd / sedation self resolved/Symptomatic brady 18 hrs 12/22-23  - Resolved, feel the cause of cerebral hypoperfusion, and cause of arrest is setting of CVD  - EP note reviewed no plans for pacer, poor candidate for perm pacer.  - Cortisol 21, borderline, but stable hemodynamically so will d/c steroids.   Anemia of chronic renal failure and upper gi bleed:  H/H remain stable  ESRD (end stage renal disease) on dialysis  - HD cath removed no further hd needed.   Diabetes Mellitus  -  Diet controlled at home - on ISS here  Code Status: full  Family Communication: daughter  Disposition Plan: Will be discharged today.  Please full discharge dictated by PCCM.   Patient was given instructions to followup with her primary care physician. She will need to discuss medications that were not restarted upon her discharge. This again was  discussed with the patient she does understand and agree.  Procedures  EGD  Consults   PCCM Nephrology  DVT Prophylaxis SCDs  Lab Results  Component Value Date   PLT 150 09/05/2013    Medications  Scheduled Meds: . darbepoetin  200 mcg Intravenous Q Mon-HD  . famotidine  40 mg Oral Daily  . hydrALAZINE  50 mg Oral TID  . insulin aspart  0-15 Units Subcutaneous TID AC   Continuous Infusions:  PRN Meds:.acetaminophen, ondansetron  Antibiotics    Anti-infectives   None       Time Spent in minutes    30 minutes   Ardine Iacovelli D.O. on 09/05/2013 at 1:13 PM  Between 7am to 7pm - Pager - [phone]  After 7pm go to [email or web address] - password [email or web address]  And look for the night coverage person covering for me after hours  [facility] Office  [phone]    Subjective:   Briana Gould seen and examined today. She has no complaints. Does state she feels somewhat weak however would still like to go home today.   Objective:  Filed Vitals:   09/05/13 1000 09/05/13 1100 09/05/13 1130 09/05/13 1155  BP: 146/58 157/69 178/70 147/62  Pulse: 63 67 60 60  Temp:    98.2 F (36.8 C)  TempSrc:    Oral  Resp:    15  Height:      Weight:    78.3 kg (172 lb 9.9 oz)  SpO2:    100%    Wt Readings from Last 3 Encounters:  09/05/13 78.3 kg (172 lb 9.9 oz)  09/05/13 78.3 kg (172 lb 9.9 oz)  08/20/13 88.1 kg (194 lb 3.6 oz)     Intake/Output Summary (Last 24 hours) at 09/05/13 1313 Last data filed at 09/05/13 1155  Gross per 24 hour  Intake 374.58 ml  Output   3000 ml  Net -2625.42 ml    Exam  General: Well developed, well nourished, NAD, appears stated age  HEENT: NCAT, PERRLA, EOMI, Anicteic Sclera, mucous membranes moist. No pharyngeal erythema or exudates  Neck: Supple, no JVD, no masses  Cardiovascular: S1 S2 auscultated, no rubs, murmurs or gallops. Regular rate and rhythm.  Respiratory: Clear to auscultation bilaterally with  equal chest rise  Abdomen: Soft, nontender, nondistended, + bowel sounds  Extremities: warm dry without cyanosis clubbing or edema, LBKA  Neuro: AAOx3, cranial nerves grossly intact.   Skin: Without rashes exudates or nodules  Psych: Normal affect and demeanor with intact judgement and insight   Data Review   Micro Results Recent Results (from the past 240 hour(s))  MRSA PCR SCREENING     Status: None   Collection Time    08/29/13 12:55 PM      Result Value Range Status   MRSA by PCR NEGATIVE  NEGATIVE Final   Comment:            The GeneXpert MRSA Assay (FDA     approved for NASAL specimens     only), is one component of a     comprehensive MRSA colonization     surveillance program. It is not     intended to diagnose MRSA     infection nor to guide or     monitor treatment for     MRSA infections.    Radiology Reports Dg Chest 1 View  08/13/2013   CLINICAL DATA:  67 year old female with acute onset diffusion shortness of breath and chest pain while in dialysis. Initial encounter.  EXAM: CHEST - 1 VIEW  COMPARISON:  11/03/2011 and earlier.  FINDINGS: Portable AP upright view at 0923 hrs. Right IJ approach dual lumen dialysis catheter in place. Stable lung volumes. Stable cardiomegaly and mediastinal contours. No pneumothorax, pulmonary edema, pleural effusion or consolidation. Overall decreased pulmonary vascularity compared to the previous exam. Multiple wires an EKG leads overlie the chest.  IMPRESSION: No acute cardiopulmonary abnormality.   Electronically Signed   By: Augusto Gamble M.D.   On: 08/13/2013 10:06   Dg Ribs Unilateral W/chest Right  08/19/2013   CLINICAL DATA:  Right rib discomfort following chest impression with CPR  EXAM: RIGHT RIBS AND CHEST - 3+ VIEW  COMPARISON:  Portable chest x-ray of August 15, 2013.  FINDINGS: The chest film reveals the lungs to be borderline hypoinflated. The interstitial markings are increased bilaterally and there are areas of  confluent density more centrally which may reflect engorged pulmonary vascularity. The cardiopericardial silhouette is enlarged. The dual lumen large caliber catheter placed via the right internal jugular approach appears unchanged in position with its tip in the region  of the mid to distal SVC. Two right rib detail films reveal no evidence of an acute displaced or nondisplaced rib fracture. There is no pneumothorax or pleural effusion.  IMPRESSION: 1. There is no evidence of a fracture of the visualized portions of the right ribs. 2. The findings are consistent with congestive heart failure with pulmonary interstitial edema. This has become more conspicuous since the previous study.   Electronically Signed   By: David  [place]   On: 08/19/2013 14:22   Ct Head Wo Contrast  08/13/2013   CLINICAL DATA:  Loss of consciousness, seizure.  EXAM: CT HEAD WITHOUT CONTRAST  TECHNIQUE: Contiguous axial images were obtained from the base of the skull through the vertex without intravenous contrast.  COMPARISON:  August 29, 2010.  FINDINGS: Bony calvarium appears intact. Diffuse cortical atrophy is noted. No mass effect or midline shift is noted. Ventricular size is within normal limits. There is no evidence of mass lesion, hemorrhage or acute infarction.  IMPRESSION: Mild diffuse cortical atrophy. No acute intracranial abnormality seen.   Electronically Signed   By: Roque Lias M.D.   On: 08/13/2013 09:58   Mr Shirlee Latch Wo Contrast  08/14/2013   CLINICAL DATA:  Diabetic hypertensive patient with end-stage renal disease on dialysis experienced a brief episode of lightheadedness and possible seizure activity well on dialysis.  EXAM: MRI HEAD WITHOUT CONTRAST  MRA HEAD WITHOUT CONTRAST  TECHNIQUE: Multiplanar, multiecho pulse sequences of the brain and surrounding structures were obtained without intravenous contrast. Angiographic images of the head were obtained using MRA technique without contrast.  COMPARISON:   08/13/2013 CT.  No comparison MR.  FINDINGS: MRI HEAD FINDINGS  Some of the sequences are motion degraded.  Tiny area of altered signal intensity left motor strip (series 3, image 25) may represent T2 shine through rather than acute infarct however if the patient has symptoms referable to the left motor strip than a tiny infarct at this level would need to be considered. Immediately inferior to this region there is evidence of a remote small infarct.  Remote right paracentral pontine infarct. Moderate small vessel disease type changes.  Small area of blood breakdown products central pons probably related to episode of hemorrhagic ischemia otherwise no evidence of intracranial hemorrhage.  No intracranial mass lesion noted on this unenhanced exam.  Global atrophy without hydrocephalus.  Altered bone marrow signal intensity consistent with anemia of renal disease.  Cervical spondylotic changes C3-4 and C4-5  Partially empty sella incidentally noted.  Mild exophthalmos.  Minimal paranasal sinus mucosal thickening.  Partial opacification inferior left mastoid air cells.  MRA HEAD FINDINGS  Marked high-grade tandem stenosis of the pre cavernous segment of the left internal carotid artery.  High-grade stenosis majority of the A1 segment of the left anterior cerebral artery.  Moderate stenosis right internal carotid artery pre cavernous and supraclinoid segment  Moderate to marked focal stenosis proximal M1 segment right middle cerebral artery.  Moderate middle cerebral artery and A2 segment anterior cerebral artery branch vessel narrowing and irregularity.  High-grade tandem stenosis right vertebral artery.  Non visualized posterior inferior cerebellar artery bilaterally.  Nonvisualized right anterior inferior cerebellar artery.  Moderate to marked focal narrowing proximal left anterior inferior cerebellar artery.  Moderate long segment stenosis proximal to mid left posterior cerebral artery.  High-grade stenosis mid to  distal right posterior cerebral artery.  No aneurysm noted.  IMPRESSION: Tiny area of altered signal intensity left motor strip may represent T2 shine through rather than acute  infarct however if the patient has symptoms referable to the left motor strip than a tiny infarct at this level would need to be considered.  Remote right paracentral pontine infarct. Moderate small vessel disease type changes.  Small area of blood breakdown products central pons probably related to episode of hemorrhagic ischemia otherwise no evidence of intracranial hemorrhage.  Significant intracranial atherosclerotic type changes as detailed above.   Electronically Signed   By: Bridgett Larsson M.D.   On: 08/14/2013 18:04   Mr Brain Wo Contrast  08/14/2013   CLINICAL DATA:  Diabetic hypertensive patient with end-stage renal disease on dialysis experienced a brief episode of lightheadedness and possible seizure activity well on dialysis.  EXAM: MRI HEAD WITHOUT CONTRAST  MRA HEAD WITHOUT CONTRAST  TECHNIQUE: Multiplanar, multiecho pulse sequences of the brain and surrounding structures were obtained without intravenous contrast. Angiographic images of the head were obtained using MRA technique without contrast.  COMPARISON:  08/13/2013 CT.  No comparison MR.  FINDINGS: MRI HEAD FINDINGS  Some of the sequences are motion degraded.  Tiny area of altered signal intensity left motor strip (series 3, image 25) may represent T2 shine through rather than acute infarct however if the patient has symptoms referable to the left motor strip than a tiny infarct at this level would need to be considered. Immediately inferior to this region there is evidence of a remote small infarct.  Remote right paracentral pontine infarct. Moderate small vessel disease type changes.  Small area of blood breakdown products central pons probably related to episode of hemorrhagic ischemia otherwise no evidence of intracranial hemorrhage.  No intracranial mass lesion  noted on this unenhanced exam.  Global atrophy without hydrocephalus.  Altered bone marrow signal intensity consistent with anemia of renal disease.  Cervical spondylotic changes C3-4 and C4-5  Partially empty sella incidentally noted.  Mild exophthalmos.  Minimal paranasal sinus mucosal thickening.  Partial opacification inferior left mastoid air cells.  MRA HEAD FINDINGS  Marked high-grade tandem stenosis of the pre cavernous segment of the left internal carotid artery.  High-grade stenosis majority of the A1 segment of the left anterior cerebral artery.  Moderate stenosis right internal carotid artery pre cavernous and supraclinoid segment  Moderate to marked focal stenosis proximal M1 segment right middle cerebral artery.  Moderate middle cerebral artery and A2 segment anterior cerebral artery branch vessel narrowing and irregularity.  High-grade tandem stenosis right vertebral artery.  Non visualized posterior inferior cerebellar artery bilaterally.  Nonvisualized right anterior inferior cerebellar artery.  Moderate to marked focal narrowing proximal left anterior inferior cerebellar artery.  Moderate long segment stenosis proximal to mid left posterior cerebral artery.  High-grade stenosis mid to distal right posterior cerebral artery.  No aneurysm noted.  IMPRESSION: Tiny area of altered signal intensity left motor strip may represent T2 shine through rather than acute infarct however if the patient has symptoms referable to the left motor strip than a tiny infarct at this level would need to be considered.  Remote right paracentral pontine infarct. Moderate small vessel disease type changes.  Small area of blood breakdown products central pons probably related to episode of hemorrhagic ischemia otherwise no evidence of intracranial hemorrhage.  Significant intracranial atherosclerotic type changes as detailed above.   Electronically Signed   By: Bridgett Larsson M.D.   On: 08/14/2013 18:04   Ir Removal Tun Cv  Cath W/o Fl  09/03/2013   CLINICAL DATA:  Patient with end-stage renal disease, functional arteriovenous fistula. Request HD catheter removal  EXAM: TUNNELED HEMODIALYSIS CATHETER REMOVAL:  TECHNIQUE: Overlying skin prepped with chlorhexidine, draped in usual sterile fashion, infiltrated locally with 1% lidocaine. The previously placed right internal jugular hemodialysis catheter was dissected free from the underlying soft tissues and removed intact. Hemostasis was achieved. Site covered with a sterile dressing. The patient tolerated the procedure well, without any complication.  IMPRESSION: 1. Technically successful tunneled hemodialysis catheter removal. Read by: Brayton El PA-C   Electronically Signed   By: Maryclare Bean M.D.   On: 09/03/2013 13:12   Dg Chest Port 1 View  08/31/2013   CLINICAL DATA:  Edema.  EXAM: PORTABLE CHEST - 1 VIEW  COMPARISON:  08/29/2013  FINDINGS: The patient is rotated to the right. Right jugular dialysis catheter remains in place with tips overlying the mid and lower SVC. The cardiac silhouette remains enlarged. Mild interstitial edema is present, which appears slightly worse than on the prior study. No large pleural effusion is identified, however the left costophrenic angle is excluded from the image.  IMPRESSION: Slight worsening of mild interstitial edema.   Electronically Signed   By: Sebastian Ache   On: 08/31/2013 07:52   Dg Chest Port 1 View  08/29/2013   CLINICAL DATA:  Shortness of breath.  EXAM: PORTABLE CHEST - 1 VIEW  COMPARISON:  08/19/2013.  FINDINGS: Dialysis catheter again noted in stable position. Previously identified cardiomegaly and pulmonary vascular congestion remains. The pulmonary vascular congestion has improved from prior study. The previously identified interstitial prominence remains but has improved. No pleural effusion or pneumothorax.  IMPRESSION: Findings consistent with partial clearing of congestive heart failure with pulmonary interstitial  edema.   Electronically Signed   By: Maisie Fus  Register   On: 08/29/2013 12:39   Dg Chest Port 1 View  08/15/2013   CLINICAL DATA:  Respiratory failure.  EXAM: PORTABLE CHEST - 1 VIEW  COMPARISON:  08/14/2013 and 08/13/2013, and 11/03/2011  FINDINGS: There is chronic cardiomegaly. Pulmonary vascularity is slightly more prominent than on the prior study. Double-lumen central venous catheter is unchanged. No effusions. No acute osseous abnormality.  IMPRESSION: Slight increased pulmonary vascularity since the prior study. Chronic cardiomegaly.   Electronically Signed   By: Geanie Cooley M.D.   On: 08/15/2013 08:33   Dg Chest Port 1 View  08/14/2013   CLINICAL DATA:  Unresponsive, possible aspiration  EXAM: PORTABLE CHEST - 1 VIEW  COMPARISON:  Chest x-ray August 13, 2013  FINDINGS: The lungs are adequately inflated. There is no focal infiltrate nor evidence of atelectasis. There is no pleural effusion or pneumothorax. The cardiopericardial silhouette remains enlarged. The pulmonary vascularity is not engorged. The dual-lumen dialysis type catheter appears unchanged via the right internal jugular approach. The mediastinum is normal in width. The observed portions of the bony thorax appear normal.  IMPRESSION: There is no evidence of aspiration or significant atelectasis. There is stable mild enlargement of the cardiac silhouette without evidence of pulmonary edema.   Electronically Signed   By: David  [place]   On: 08/14/2013 09:20    CBC  Recent Labs Lab 08/31/13 0955 09/01/13 0352 09/03/13 0523 09/04/13 2105 09/05/13 0708  WBC 11.0* 10.0 9.0 8.9 8.6  HGB 8.2* 8.2* 7.5* 9.2* 9.0*  HCT 24.0* 24.9* 22.3* 28.0* 26.8*  PLT 153 173 143* 159 150  MCV 95.2 96.9 97.0 94.3 93.1  MCH 32.5 31.9 32.6 31.0 31.3  MCHC 34.2 32.9 33.6 32.9 33.6  RDW 15.5 15.7* 16.0* 17.4* 17.4*    Chemistries   Recent Labs  Lab 08/30/13 0255 08/31/13 0403 09/01/13 0352 09/03/13 0523 09/05/13 0726  NA 137 141 140 139  132*  K 4.5 3.3* 4.0 3.4* 4.3  CL 94* 98 99 97 94*  CO2 25 26 27 28 26   GLUCOSE 162* 114* 102* 168* 140*  BUN 95* 29* 41* 34* 37*  CREATININE 7.70* 3.46* 5.71* 5.47* 5.44*  CALCIUM 9.4 9.1 9.6 9.0 9.0   ------------------------------------------------------------------------------------------------------------------ estimated creatinine clearance is 10.6 ml/min (by C-G formula based on Cr of 5.44). ------------------------------------------------------------------------------------------------------------------ No results found for this basename: HGBA1C,  in the last 72 hours ------------------------------------------------------------------------------------------------------------------ No results found for this basename: CHOL, HDL, LDLCALC, TRIG, CHOLHDL, LDLDIRECT,  in the last 72 hours ------------------------------------------------------------------------------------------------------------------ No results found for this basename: TSH, T4TOTAL, FREET3, T3FREE, THYROIDAB,  in the last 72 hours ------------------------------------------------------------------------------------------------------------------ No results found for this basename: VITAMINB12, FOLATE, FERRITIN, TIBC, IRON, RETICCTPCT,  in the last 72 hours  Coagulation profile  Recent Labs Lab 08/29/13 1427  INR 1.54*    No results found for this basename: DDIMER,  in the last 72 hours  Cardiac Enzymes  Recent Labs Lab 08/29/13 2100 08/30/13 0255 08/30/13 0858  TROPONINI <0.30 <0.30 0.34*   ------------------------------------------------------------------------------------------------------------------ No components found with this basename: POCBNP,

## 2013-09-05 NOTE — Procedures (Signed)
I was present at this dialysis session. I have reviewed the session itself and made appropriate changes.   Using AVF w/o problem.s Goal UF 3.5L. BP ok.  Pt feels well.   Sabra Heck  MD 09/05/2013, 8:54 AM

## 2013-09-05 NOTE — Progress Notes (Signed)
Pt to be discharged to home with home health PT services. Telemetry box #3 removed and returned to nurse's station. PIV removed. Daughter at bedside. Discharge instructions and prescriptions reviewed with patient. All belongings returned.  Briana Gould, Sicilia Killough Downing

## 2013-09-05 NOTE — Progress Notes (Signed)
   CARE MANAGEMENT NOTE 09/05/2013  Patient:  Briana Gould, Briana Gould   Account Number:  [id number]  Date Initiated:  09/03/2013  Documentation initiated by:  ROYAL,CHERYL  Subjective/Objective Assessment:   Orders for HHPT     Action/Plan:   Met with pt who is active with [facility], she wishes to continue with [facility]. HHPT will be added to current [facility] services. [facility] notified.   Anticipated DC Date:  09/03/2013   Anticipated DC Plan:  HOME W HOME HEALTH SERVICES      DC Planning Services  CM consult      Choice offered to / List presented to:             [facility] agency  [facility]   Status of service:  Completed, signed off Medicare Important Message given?   (If response is "NO", the following Medicare IM given date fields will be blank) Date Medicare IM given:   Date Additional Medicare IM given:    Discharge Disposition:  HOME W HOME HEALTH SERVICES  Per UR Regulation:    If discussed at Long Length of Stay Meetings, dates discussed:    Comments:  09/05/13 16:50 RN called to confirm HHPT was arranged for pt with [facility].  Pt states she does not need a RW as she has one at home.  Address and contact number verified.  [facility] notified of discharge.  No other CM needs were communicated.  Freddy Jaksch, BSN, CM [phone].

## 2013-09-20 ENCOUNTER — Encounter (HOSPITAL_COMMUNITY): Payer: Self-pay | Admitting: Cardiovascular Disease

## 2013-09-20 ENCOUNTER — Inpatient Hospital Stay (HOSPITAL_COMMUNITY): Payer: Medicare Other

## 2013-09-20 ENCOUNTER — Inpatient Hospital Stay (HOSPITAL_COMMUNITY)
Admission: EM | Admit: 2013-09-20 | Discharge: 2013-10-15 | DRG: 314 | Disposition: A | Discharge status: Rehabilitation Facility | Payer: Medicare Other | Attending: Internal Medicine | Admitting: Internal Medicine

## 2013-09-20 ENCOUNTER — Emergency Department (HOSPITAL_COMMUNITY): Payer: Medicare Other

## 2013-09-20 DIAGNOSIS — I519 Heart disease, unspecified: Principal | ICD-10-CM | POA: Diagnosis present

## 2013-09-20 DIAGNOSIS — Z01810 Encounter for preprocedural cardiovascular examination: Secondary | ICD-10-CM

## 2013-09-20 DIAGNOSIS — I469 Cardiac arrest, cause unspecified: Secondary | ICD-10-CM | POA: Diagnosis present

## 2013-09-20 DIAGNOSIS — I369 Nonrheumatic tricuspid valve disorder, unspecified: Secondary | ICD-10-CM

## 2013-09-20 DIAGNOSIS — J9601 Acute respiratory failure with hypoxia: Secondary | ICD-10-CM | POA: Diagnosis present

## 2013-09-20 DIAGNOSIS — G9340 Encephalopathy, unspecified: Secondary | ICD-10-CM

## 2013-09-20 DIAGNOSIS — J9600 Acute respiratory failure, unspecified whether with hypoxia or hypercapnia: Secondary | ICD-10-CM | POA: Diagnosis present

## 2013-09-20 DIAGNOSIS — D72829 Elevated white blood cell count, unspecified: Secondary | ICD-10-CM | POA: Diagnosis not present

## 2013-09-20 DIAGNOSIS — Z9849 Cataract extraction status, unspecified eye: Secondary | ICD-10-CM

## 2013-09-20 DIAGNOSIS — I672 Cerebral atherosclerosis: Secondary | ICD-10-CM | POA: Diagnosis present

## 2013-09-20 DIAGNOSIS — I739 Peripheral vascular disease, unspecified: Secondary | ICD-10-CM | POA: Diagnosis present

## 2013-09-20 DIAGNOSIS — J209 Acute bronchitis, unspecified: Secondary | ICD-10-CM | POA: Diagnosis present

## 2013-09-20 DIAGNOSIS — I2789 Other specified pulmonary heart diseases: Secondary | ICD-10-CM | POA: Diagnosis present

## 2013-09-20 DIAGNOSIS — I6529 Occlusion and stenosis of unspecified carotid artery: Secondary | ICD-10-CM | POA: Diagnosis present

## 2013-09-20 DIAGNOSIS — G931 Anoxic brain damage, not elsewhere classified: Secondary | ICD-10-CM | POA: Diagnosis present

## 2013-09-20 DIAGNOSIS — E1149 Type 2 diabetes mellitus with other diabetic neurological complication: Secondary | ICD-10-CM | POA: Diagnosis present

## 2013-09-20 DIAGNOSIS — Z992 Dependence on renal dialysis: Secondary | ICD-10-CM

## 2013-09-20 DIAGNOSIS — K209 Esophagitis, unspecified without bleeding: Secondary | ICD-10-CM | POA: Diagnosis present

## 2013-09-20 DIAGNOSIS — I509 Heart failure, unspecified: Secondary | ICD-10-CM | POA: Diagnosis present

## 2013-09-20 DIAGNOSIS — Z8249 Family history of ischemic heart disease and other diseases of the circulatory system: Secondary | ICD-10-CM

## 2013-09-20 DIAGNOSIS — K279 Peptic ulcer, site unspecified, unspecified as acute or chronic, without hemorrhage or perforation: Secondary | ICD-10-CM | POA: Diagnosis present

## 2013-09-20 DIAGNOSIS — I447 Left bundle-branch block, unspecified: Secondary | ICD-10-CM | POA: Diagnosis present

## 2013-09-20 DIAGNOSIS — I5030 Unspecified diastolic (congestive) heart failure: Secondary | ICD-10-CM | POA: Diagnosis present

## 2013-09-20 DIAGNOSIS — I10 Essential (primary) hypertension: Secondary | ICD-10-CM | POA: Diagnosis present

## 2013-09-20 DIAGNOSIS — Y841 Kidney dialysis as the cause of abnormal reaction of the patient, or of later complication, without mention of misadventure at the time of the procedure: Secondary | ICD-10-CM | POA: Diagnosis present

## 2013-09-20 DIAGNOSIS — I480 Paroxysmal atrial fibrillation: Secondary | ICD-10-CM | POA: Diagnosis not present

## 2013-09-20 DIAGNOSIS — K269 Duodenal ulcer, unspecified as acute or chronic, without hemorrhage or perforation: Secondary | ICD-10-CM | POA: Diagnosis present

## 2013-09-20 DIAGNOSIS — D638 Anemia in other chronic diseases classified elsewhere: Secondary | ICD-10-CM | POA: Diagnosis present

## 2013-09-20 DIAGNOSIS — I82C19 Acute embolism and thrombosis of unspecified internal jugular vein: Secondary | ICD-10-CM | POA: Diagnosis present

## 2013-09-20 DIAGNOSIS — K259 Gastric ulcer, unspecified as acute or chronic, without hemorrhage or perforation: Secondary | ICD-10-CM | POA: Diagnosis present

## 2013-09-20 DIAGNOSIS — Z8719 Personal history of other diseases of the digestive system: Secondary | ICD-10-CM

## 2013-09-20 DIAGNOSIS — K920 Hematemesis: Secondary | ICD-10-CM | POA: Diagnosis not present

## 2013-09-20 DIAGNOSIS — N186 End stage renal disease: Secondary | ICD-10-CM

## 2013-09-20 DIAGNOSIS — D696 Thrombocytopenia, unspecified: Secondary | ICD-10-CM | POA: Diagnosis not present

## 2013-09-20 DIAGNOSIS — E1169 Type 2 diabetes mellitus with other specified complication: Secondary | ICD-10-CM | POA: Diagnosis present

## 2013-09-20 DIAGNOSIS — I120 Hypertensive chronic kidney disease with stage 5 chronic kidney disease or end stage renal disease: Secondary | ICD-10-CM | POA: Diagnosis present

## 2013-09-20 DIAGNOSIS — Z86718 Personal history of other venous thrombosis and embolism: Secondary | ICD-10-CM

## 2013-09-20 DIAGNOSIS — N039 Chronic nephritic syndrome with unspecified morphologic changes: Secondary | ICD-10-CM

## 2013-09-20 DIAGNOSIS — I639 Cerebral infarction, unspecified: Secondary | ICD-10-CM | POA: Diagnosis present

## 2013-09-20 DIAGNOSIS — S88119A Complete traumatic amputation at level between knee and ankle, unspecified lower leg, initial encounter: Secondary | ICD-10-CM

## 2013-09-20 DIAGNOSIS — I4891 Unspecified atrial fibrillation: Secondary | ICD-10-CM | POA: Diagnosis not present

## 2013-09-20 DIAGNOSIS — D631 Anemia in chronic kidney disease: Secondary | ICD-10-CM | POA: Diagnosis present

## 2013-09-20 DIAGNOSIS — E1142 Type 2 diabetes mellitus with diabetic polyneuropathy: Secondary | ICD-10-CM | POA: Diagnosis present

## 2013-09-20 DIAGNOSIS — Z6833 Body mass index (BMI) 33.0-33.9, adult: Secondary | ICD-10-CM

## 2013-09-20 DIAGNOSIS — D62 Acute posthemorrhagic anemia: Secondary | ICD-10-CM | POA: Diagnosis present

## 2013-09-20 DIAGNOSIS — I6350 Cerebral infarction due to unspecified occlusion or stenosis of unspecified cerebral artery: Secondary | ICD-10-CM | POA: Diagnosis not present

## 2013-09-20 DIAGNOSIS — J690 Pneumonitis due to inhalation of food and vomit: Secondary | ICD-10-CM | POA: Diagnosis present

## 2013-09-20 DIAGNOSIS — Z8674 Personal history of sudden cardiac arrest: Secondary | ICD-10-CM

## 2013-09-20 DIAGNOSIS — I2510 Atherosclerotic heart disease of native coronary artery without angina pectoris: Secondary | ICD-10-CM | POA: Diagnosis present

## 2013-09-20 HISTORY — DX: Type 2 diabetes mellitus with hyperglycemia: E11.65

## 2013-09-20 HISTORY — DX: Disorder of arteries and arterioles, unspecified: I77.9

## 2013-09-20 HISTORY — DX: Atherosclerotic heart disease of native coronary artery without angina pectoris: I25.10

## 2013-09-20 HISTORY — DX: Type 2 diabetes mellitus with unspecified complications: E11.8

## 2013-09-20 HISTORY — DX: Reserved for concepts with insufficient information to code with codable children: IMO0002

## 2013-09-20 LAB — COMPREHENSIVE METABOLIC PANEL
ALK PHOS: 105 U/L (ref 39–117)
ALT: 101 U/L — AB (ref 0–35)
ALT: 96 U/L — AB (ref 0–35)
AST: 129 U/L — AB (ref 0–37)
AST: 121 U/L — AB (ref 0–37)
Albumin: 3.4 g/dL — ABNORMAL LOW (ref 3.5–5.2)
Albumin: 3.2 g/dL — ABNORMAL LOW (ref 3.5–5.2)
Alkaline Phosphatase: 101 U/L (ref 39–117)
BUN: 38 mg/dL — ABNORMAL HIGH (ref 6–23)
BUN: 41 mg/dL — ABNORMAL HIGH (ref 6–23)
CALCIUM: 9 mg/dL (ref 8.4–10.5)
CO2: 23 meq/L (ref 19–32)
CO2: 21 mEq/L (ref 19–32)
Calcium: 9.4 mg/dL (ref 8.4–10.5)
Chloride: 94 mEq/L — ABNORMAL LOW (ref 96–112)
Chloride: 98 mEq/L (ref 96–112)
Creatinine, Ser: 7.22 mg/dL — ABNORMAL HIGH (ref 0.50–1.10)
Creatinine, Ser: 6.92 mg/dL — ABNORMAL HIGH (ref 0.50–1.10)
GFR calc Af Amer: 6 mL/min — ABNORMAL LOW (ref >90)
GFR calc Af Amer: 6 mL/min — ABNORMAL LOW (ref >90)
GFR calc non Af Amer: 5 mL/min — ABNORMAL LOW (ref >90)
GFR calc non Af Amer: 5 mL/min — ABNORMAL LOW (ref >90)
Glucose, Bld: 153 mg/dL — ABNORMAL HIGH (ref 70–99)
Glucose, Bld: 240 mg/dL — ABNORMAL HIGH (ref 70–99)
POTASSIUM: 4.5 meq/L (ref 3.7–5.3)
Potassium: 4.4 mEq/L (ref 3.7–5.3)
SODIUM: 140 meq/L (ref 137–147)
SODIUM: 140 meq/L (ref 137–147)
TOTAL PROTEIN: 7 g/dL (ref 6.0–8.3)
Total Bilirubin: 0.4 mg/dL (ref 0.3–1.2)
Total Bilirubin: 0.6 mg/dL (ref 0.3–1.2)
Total Protein: 6.7 g/dL (ref 6.0–8.3)

## 2013-09-20 LAB — CG4 I-STAT (LACTIC ACID): Lactic Acid, Venous: 3.18 mmol/L — ABNORMAL HIGH (ref 0.5–2.2)

## 2013-09-20 LAB — POCT I-STAT TROPONIN I: TROPONIN I, POC: 0.09 ng/mL — AB (ref 0.00–0.08)

## 2013-09-20 LAB — POCT I-STAT, CHEM 8
BUN: 44 mg/dL — AB (ref 6–23)
CALCIUM ION: 1.09 mmol/L — AB (ref 1.13–1.30)
Chloride: 100 mEq/L (ref 96–112)
Creatinine, Ser: 7.6 mg/dL — ABNORMAL HIGH (ref 0.50–1.10)
GLUCOSE: 159 mg/dL — AB (ref 70–99)
HCT: 37 % (ref 36.0–46.0)
Hemoglobin: 12.6 g/dL (ref 12.0–15.0)
Potassium: 4.3 mEq/L (ref 3.7–5.3)
Sodium: 138 mEq/L (ref 137–147)
TCO2: 27 mmol/L (ref 0–100)

## 2013-09-20 LAB — TYPE AND SCREEN
ABO/RH(D): O POS
ANTIBODY SCREEN: NEGATIVE

## 2013-09-20 LAB — POCT I-STAT 3, ART BLOOD GAS (G3+)
Acid-Base Excess: 1 mmol/L (ref 0.0–2.0)
Bicarbonate: 27.1 mEq/L — ABNORMAL HIGH (ref 20.0–24.0)
O2 SAT: 100 %
PCO2 ART: 46.2 mmHg — AB (ref 35.0–45.0)
Patient temperature: 35
TCO2: 29 mmol/L (ref 0–100)
pH, Arterial: 7.367 (ref 7.350–7.450)
pO2, Arterial: 281 mmHg — ABNORMAL HIGH (ref 80.0–100.0)

## 2013-09-20 LAB — PHOSPHORUS: PHOSPHORUS: 5 mg/dL — AB (ref 2.3–4.6)

## 2013-09-20 LAB — MAGNESIUM: Magnesium: 2.4 mg/dL (ref 1.5–2.5)

## 2013-09-20 LAB — CBC WITH DIFFERENTIAL/PLATELET
Basophils Absolute: 0 10*3/uL (ref 0.0–0.1)
Basophils Relative: 0 % (ref 0–1)
EOS ABS: 0.2 10*3/uL (ref 0.0–0.7)
Eosinophils Relative: 2 % (ref 0–5)
HCT: 33.5 % — ABNORMAL LOW (ref 36.0–46.0)
HEMOGLOBIN: 11.2 g/dL — AB (ref 12.0–15.0)
LYMPHS ABS: 1.4 10*3/uL (ref 0.7–4.0)
Lymphocytes Relative: 13 % (ref 12–46)
MCH: 32.2 pg (ref 26.0–34.0)
MCHC: 33.4 g/dL (ref 30.0–36.0)
MCV: 96.3 fL (ref 78.0–100.0)
Monocytes Absolute: 0.3 10*3/uL (ref 0.1–1.0)
Monocytes Relative: 3 % (ref 3–12)
NEUTROS ABS: 8.2 10*3/uL — AB (ref 1.7–7.7)
NEUTROS PCT: 81 % — AB (ref 43–77)
PLATELETS: 92 10*3/uL — AB (ref 150–400)
RBC: 3.48 MIL/uL — ABNORMAL LOW (ref 3.87–5.11)
RDW: 18.8 % — ABNORMAL HIGH (ref 11.5–15.5)
WBC: 10.1 10*3/uL (ref 4.0–10.5)

## 2013-09-20 LAB — ABO/RH: ABO/RH(D): O POS

## 2013-09-20 LAB — TROPONIN I
TROPONIN I: 0.54 ng/mL — AB (ref <0.30)
Troponin I: 0.85 ng/mL (ref <0.30)

## 2013-09-20 LAB — PROCALCITONIN: Procalcitonin: 0.52 ng/mL

## 2013-09-20 LAB — GLUCOSE, CAPILLARY
GLUCOSE-CAPILLARY: 106 mg/dL — AB (ref 70–99)
GLUCOSE-CAPILLARY: 165 mg/dL — AB (ref 70–99)
GLUCOSE-CAPILLARY: 222 mg/dL — AB (ref 70–99)
Glucose-Capillary: 85 mg/dL (ref 70–99)

## 2013-09-20 LAB — AMYLASE: Amylase: 132 U/L — ABNORMAL HIGH (ref 0–105)

## 2013-09-20 LAB — APTT: aPTT: 39 seconds — ABNORMAL HIGH (ref 24–37)

## 2013-09-20 LAB — MRSA PCR SCREENING: MRSA by PCR: NEGATIVE

## 2013-09-20 LAB — PRO B NATRIURETIC PEPTIDE
PRO B NATRI PEPTIDE: 57457 pg/mL — AB (ref 0–125)
Pro B Natriuretic peptide (BNP): 47172 pg/mL — ABNORMAL HIGH (ref 0–125)

## 2013-09-20 LAB — PROTIME-INR
INR: 1.19 (ref 0.00–1.49)
PROTHROMBIN TIME: 14.8 s (ref 11.6–15.2)

## 2013-09-20 MED ORDER — SODIUM CHLORIDE 0.9 % IV SOLN
50.0000 ug/h | INTRAVENOUS | Status: DC
  Filled 2013-09-20: qty 50

## 2013-09-20 MED ORDER — NOREPINEPHRINE BITARTRATE 1 MG/ML IJ SOLN
0.5000 ug/min | INTRAVENOUS | Status: DC
  Filled 2013-09-20: qty 4

## 2013-09-20 MED ORDER — ROCURONIUM BROMIDE 50 MG/5ML IV SOLN
100.0000 mg | Freq: Once | INTRAVENOUS | Status: AC
  Administered 2013-09-20: 100 mg via INTRAVENOUS
  Filled 2013-09-20: qty 10

## 2013-09-20 MED ORDER — SODIUM CHLORIDE 0.9 % IV SOLN: 2000.0000 mL | Freq: Once | INTRAVENOUS | Status: DC

## 2013-09-20 MED ORDER — MIDAZOLAM HCL 5 MG/ML IJ SOLN: 2.0000 mg | Freq: Once | INTRAMUSCULAR | Status: DC | PRN

## 2013-09-20 MED ORDER — SODIUM CHLORIDE 0.9 % IV SOLN
25.0000 ug/h | INTRAVENOUS | Status: DC
  Administered 2013-09-20: 50 ug/h via INTRAVENOUS
  Filled 2013-09-20: qty 50

## 2013-09-20 MED ORDER — MIDAZOLAM BOLUS VIA INFUSION
2.0000 mg | INTRAVENOUS | Status: DC | PRN
  Filled 2013-09-20: qty 2

## 2013-09-20 MED ORDER — INSULIN ASPART 100 UNIT/ML ~~LOC~~ SOLN
2.0000 [IU] | SUBCUTANEOUS | Status: DC
  Administered 2013-09-20: 4 [IU] via SUBCUTANEOUS

## 2013-09-20 MED ORDER — SODIUM CHLORIDE 0.9 % IV SOLN
2.0000 mg/h | INTRAVENOUS | Status: DC
  Filled 2013-09-20: qty 10

## 2013-09-20 MED ORDER — BIOTENE DRY MOUTH MT LIQD
15.0000 mL | Freq: Four times a day (QID) | OROMUCOSAL | Status: DC
  Administered 2013-09-21 – 2013-09-30 (×26): 15 mL via OROMUCOSAL

## 2013-09-20 MED ORDER — SODIUM CHLORIDE 0.9 % IV SOLN
INTRAVENOUS | Status: DC
  Administered 2013-09-20: 10 mL/h via INTRAVENOUS

## 2013-09-20 MED ORDER — CHLORHEXIDINE GLUCONATE 0.12 % MT SOLN
15.0000 mL | Freq: Two times a day (BID) | OROMUCOSAL | Status: DC
  Administered 2013-09-20 – 2013-09-30 (×15): 15 mL via OROMUCOSAL
  Filled 2013-09-20 (×24): qty 15

## 2013-09-20 MED ORDER — INSULIN ASPART 100 UNIT/ML ~~LOC~~ SOLN: 2.0000 [IU] | SUBCUTANEOUS | Status: DC

## 2013-09-20 MED ORDER — CISATRACURIUM BESYLATE 10 MG/ML IV SOLN
1.0000 ug/kg/min | INTRAVENOUS | Status: DC
  Filled 2013-09-20: qty 20

## 2013-09-20 MED ORDER — SODIUM CHLORIDE 0.9 % IV SOLN
25.0000 ug/h | INTRAVENOUS | Status: DC
  Administered 2013-09-20: 50 ug/h via INTRAVENOUS
  Administered 2013-09-21: 200 ug/h via INTRAVENOUS
  Filled 2013-09-20 (×4): qty 50

## 2013-09-20 MED ORDER — MIDAZOLAM HCL 5 MG/ML IJ SOLN: 2.0000 mg | Freq: Once | INTRAMUSCULAR | Status: DC

## 2013-09-20 MED ORDER — FENTANYL BOLUS VIA INFUSION
50.0000 ug | INTRAVENOUS | Status: DC | PRN
  Filled 2013-09-20: qty 50

## 2013-09-20 MED ORDER — ETOMIDATE 2 MG/ML IV SOLN
20.0000 mg | Freq: Once | INTRAVENOUS | Status: AC
  Administered 2013-09-20: 20 mg via INTRAVENOUS

## 2013-09-20 MED ORDER — CISATRACURIUM BOLUS VIA INFUSION
0.1000 mg/kg | Freq: Once | INTRAVENOUS | Status: DC
  Filled 2013-09-20: qty 12

## 2013-09-20 MED ORDER — PANTOPRAZOLE SODIUM 40 MG IV SOLR
40.0000 mg | Freq: Every day | INTRAVENOUS | Status: DC
  Administered 2013-09-20 – 2013-09-23 (×4): 40 mg via INTRAVENOUS
  Filled 2013-09-20 (×6): qty 40

## 2013-09-20 MED ORDER — FENTANYL CITRATE 0.05 MG/ML IJ SOLN: 100.0000 ug | Freq: Once | INTRAMUSCULAR | Status: DC | PRN

## 2013-09-20 MED ORDER — CISATRACURIUM BOLUS VIA INFUSION
0.0500 mg/kg | INTRAVENOUS | Status: DC | PRN
  Filled 2013-09-20: qty 6

## 2013-09-20 MED ORDER — SODIUM CHLORIDE 0.9 % IV SOLN
3.0000 g | Freq: Two times a day (BID) | INTRAVENOUS | Status: DC
  Administered 2013-09-20 – 2013-09-21 (×2): 3 g via INTRAVENOUS
  Filled 2013-09-20 (×3): qty 3

## 2013-09-20 MED ORDER — ASPIRIN 300 MG RE SUPP: 300.0000 mg | RECTAL | Status: DC

## 2013-09-20 MED ORDER — HYDRALAZINE HCL 20 MG/ML IJ SOLN
20.0000 mg | INTRAMUSCULAR | Status: DC | PRN
  Administered 2013-09-20 – 2013-09-24 (×5): 20 mg via INTRAVENOUS
  Filled 2013-09-20 (×6): qty 1

## 2013-09-20 MED ORDER — FENTANYL CITRATE 0.05 MG/ML IJ SOLN: 100.0000 ug | Freq: Once | INTRAMUSCULAR | Status: DC

## 2013-09-20 MED ORDER — SODIUM CHLORIDE 0.9 % IV SOLN
1.0000 mg/h | INTRAVENOUS | Status: DC
  Administered 2013-09-20: 2 mg/h via INTRAVENOUS
  Filled 2013-09-20: qty 10

## 2013-09-20 MED ORDER — DEXTROSE 50 % IV SOLN
INTRAVENOUS | Status: AC
  Administered 2013-09-20: 25 mL
  Filled 2013-09-20: qty 50

## 2013-09-20 MED ORDER — SODIUM CHLORIDE 0.9 % IV SOLN
1.0000 mg/h | INTRAVENOUS | Status: DC
  Filled 2013-09-20: qty 10

## 2013-09-20 MED ORDER — NICARDIPINE HCL IN NACL 20-0.86 MG/200ML-% IV SOLN
5.0000 mg/h | INTRAVENOUS | Status: DC
  Filled 2013-09-20: qty 200

## 2013-09-20 NOTE — ED Notes (Signed)
Fentanyl and Versed started, no bar code on bags to scan yet.

## 2013-09-20 NOTE — ED Notes (Signed)
Family at the bedside.

## 2013-09-20 NOTE — ED Provider Notes (Signed)
I saw and evaluated the patient, reviewed the resident's note and I agree with the findings and plan.  EKG Interpretation    Date/Time:  Monday September 20 2013 09:10:15 EST Ventricular Rate:  75 PR Interval:    QRS Duration: 129 QT Interval:  475 QTC Calculation: 531 R Axis:   -40 Text Interpretation:  Age not entered, assumed to be  68 years old for purpose of ECG interpretation Accelerated junctional rhythm Left bundle branch block Baseline wander in lead(s) V6 No prior EKG to compare to Confirmed by [facility]  MD, Allysha Tryon ([id number]) on 09/20/2013 9:22:02 AM           Angiocath insertion Performed by: Dagmar HaitWALDEN, WILLIAM Junnie Loschiavo  Consent: Verbal consent obtained. Risks and benefits: risks, benefits and alternatives were discussed Time out: Immediately prior to procedure a "time out" was called to verify the correct patient, procedure, equipment, support staff and site/side marked as required.  Preparation: Patient was prepped and draped in the usual sterile fashion.  Vein Location: L EJ  Not Ultrasound Guided  Gauge: 18  Normal blood return and flush without difficulty Patient tolerance: Patient tolerated the procedure well with no immediate complications.    CRITICAL CARE Performed by: Dagmar HaitWALDEN, WILLIAM Annaya Bangert   Total critical care time: 45 minutes  Critical care time was exclusive of separately billable procedures and treating other patients.  Critical care was necessary to treat or prevent imminent or life-threatening deterioration.  Critical care was time spent personally by me on the following activities: development of treatment plan with patient and/or surrogate as well as nursing, discussions with consultants, evaluation of patient's response to treatment, examination of patient, obtaining history from patient or surrogate, ordering and performing treatments and interventions, ordering and review of laboratory studies, ordering and review of radiographic studies, pulse  oximetry and re-evaluation of patient's condition.   Patient here post arrest. She had 20 minutes of CPR prior to EMS arrival. EMS noted that pressures, good pulses, and place taking airway. She was brought here for further eval. She did have a syncopal event on dialysis. Patient here with good pressure good pulses upon arrival. I was at the bedside per Dr. Dutch QuintBringolf's intubation. Good cord view with Glidescope with successful tube placement. Left-sided EJ place by Dr. Ethelene BrownsBringolf, procedure note as above. Hyperthermia protocol initiated. Patient admitted to critical care. Cardiology also contacted.  Dagmar HaitWilliam Suzette Flagler, MD 09/20/13 [phone]

## 2013-09-20 NOTE — Progress Notes (Signed)
ANTIBIOTIC CONSULT NOTE - INITIAL  Pharmacy Consult for unasyn Indication: rule out pneumonia  Allergies not on file  Patient Measurements: Height: 5\' 5"  (165.1 cm) Weight: 264 lb 8.8 oz (120 kg) IBW/kg (Calculated) : 57   Vital Signs: Temp: 95.2 F (35.1 C) (01/12 1000) Temp src: Rectal (01/12 0918) BP: 185/78 mmHg (01/12 1000) Pulse Rate: 95 (01/12 1000) Intake/Output from previous day:   Intake/Output from this shift:    Labs:  Recent Labs  09/20/13 0913 09/20/13 0914  WBC 10.1  --   HGB 11.2* 12.6  PLT 92*  --   CREATININE  --  7.60*   Estimated Creatinine Clearance: 9.3 ml/min (by C-G formula based on Cr of 7.6). No results found for this basename: VANCOTROUGH, VANCOPEAK, VANCORANDOM, GENTTROUGH, GENTPEAK, GENTRANDOM, TOBRATROUGH, TOBRAPEAK, TOBRARND, AMIKACINPEAK, AMIKACINTROU, AMIKACIN,  in the last 72 hours   Microbiology: No results found for this or any previous visit (from the past 720 hour(s)).  Medical History: No past medical history on file.  Medications:  See med rec Assessment: 68 yo lady admitted s/p cardiac arrest to start empiric unasyn.  She is ESRD on HD.  Goal of Therapy:  Eradication of infection  Plan:  Unasyn 3 gm IV q12 hours. F/u clinical course and cultures.  Rotunda Worden Poteet 09/20/2013,10:14 AM

## 2013-09-20 NOTE — Consult Note (Signed)
CARDIOLOGY CONSULT NOTE  [name], MRN: [id number], DOB/AGE: 68-Oct-1947 68 y.o. Admit date: 09/20/2013 Date of Consult: 09/20/2013  Primary Physician: [name], MD Primary Cardiologist: none Referring Physician: CCM  Chief Complaint: Cardiac Arrest Reason for Consultation: Cardiac arrest  HPI: 68 y.o. female w/ PMHx significant for ESRD who presented to [facility] on 09/20/2013 with cardiac arrest. She was undergoing hemodialysis this morning and she became unresponsive. She was found to have PEA arrest. CPR was performed for 20 minutes. No medications were administered. She had return of spontaneous circulation with resuscitative efforts. We are asked to see her for further cardiac evaluation after her arrest. She has had syncope and collapse in the past and was just hospitalized in December 2014. At that time she had marked bradycardia and required dopamine. She was seen by our electrophysiology service and thought to be a very poor candidate for permanent pacemaker. This was in the setting of end-stage renal disease, right internal jugular vein DVT, left arm AV fistula, and subclavian catheter for dialysis. After several days off of her beta blocker, she was able to be weaned from dopamine and had no further issues. She was back in her normal state of health until her collapse this morning. There is no other history available at this time.  Past Medical History  Diagnosis Date  . End stage renal disease on dialysis   . Peripheral arterial occlusive disease   . Hypertension   . Cardiac arrest   . Diabetes mellitus type 2, uncontrolled, with complications   . DVT (deep venous thrombosis)      Right internal jugular vein      Surgical History:  Past Surgical History  Procedure Laterality Date  . Below knee leg amputation Left   . Av fistula placement Left      Home Meds: Prior to Admission medications   Medication Sig Start Date End Date Taking?  Authorizing Provider  amLODipine (NORVASC) 5 MG tablet Take 5 mg by mouth daily.   Yes Historical Provider, MD  b complex-vitamin c-folic acid (NEPHRO-VITE) 0.8 MG TABS tablet Take 1 tablet by mouth daily.   Yes Historical Provider, MD  doxazosin (CARDURA) 8 MG tablet Take 8 mg by mouth daily.   Yes Historical Provider, MD  famotidine (PEPCID) 40 MG tablet Take 40 mg by mouth daily.   Yes Historical Provider, MD  hydrALAZINE (APRESOLINE) 50 MG tablet Take 50 mg by mouth 2 (two) times daily.   Yes Historical Provider, MD  lisinopril (PRINIVIL,ZESTRIL) 40 MG tablet Take 40 mg by mouth daily.   Yes Historical Provider, MD  Nutritional Supplements (FEEDING SUPPLEMENT, NEPRO CARB STEADY,) LIQD Take 237 mLs by mouth daily.   Yes Historical Provider, MD  sevelamer carbonate (RENVELA) 800 MG tablet Take 1,600 mg by mouth 3 (three) times daily with meals.    Yes Historical Provider, MD  traMADol (ULTRAM) 50 MG tablet Take 50 mg by mouth every 12 (twelve) hours as needed (for pain).    Yes Historical Provider, MD    Inpatient Medications:  . pantoprazole (PROTONIX) IV  40 mg Intravenous QHS   . sodium chloride    . ampicillin-sulbactam (UNASYN) IV    . fentaNYL infusion INTRAVENOUS    . niCARDipine      Allergies: No Known Allergies  History   Social History  . Marital Status: Married    Spouse Name: N/A    Number of Children: N/A  . Years of Education: N/A  Occupational History  . Not on file.   Social History Main Topics  . Smoking status: Not on file  . Smokeless tobacco: Not on file  . Alcohol Use: Not on file  . Drug Use: Not on file  . Sexual Activity: Not on file   Other Topics Concern  . Not on file   Social History Narrative   Nonsmoker, no alcohol. Supportive family.     No family history on file.  Unable to obtain family history.  Review of Systems: Unable to obtain review of systems.  Physical Exam: Blood pressure 159/64, pulse 84, temperature 94.8 F (34.9  C), temperature source Rectal, resp. rate 26, height 5\' 5"  (1.651 m), weight 120 kg (264 lb 8.8 oz), SpO2 99.00%. General: The patient is intubated. She is unresponsive.  HEENT: There is an endotracheal tube in place. There is some dried blood in the perioral region Neck:  Carotids 2+ without bruits. JVP normal Lungs: Clear bilaterally to auscultation without wheezes, rales, or rhonchi. Patient on ventilator Heart: RRR with normal S1 and S2. Grade 2/6 systolic murmur at the left sternal border appreciated. Abdomen: Soft, non-tender, positive bowel sounds, no obvious organomegaly Msk:  No obvious deformity Extremities: No clubbing, cyanosis, or edema.  Left BKA  Neuro: Unresponsive Psych:  Unresponsive     Labs: No results found for this basename: CKTOTAL, CKMB, TROPONINI,  in the last 72 hours Lab Results  Component Value Date   WBC 10.1 09/20/2013   HGB 12.6 09/20/2013   HCT 37.0 09/20/2013   MCV 96.3 09/20/2013   PLT 92* 09/20/2013     Recent Labs Lab 09/20/13 0913 09/20/13 0914  NA 140 138  K 4.5 4.3  CL 94* 100  CO2 23  --   BUN 38* 44*  CREATININE 7.22* 7.60*  CALCIUM 9.4  --   PROT 7.0  --   BILITOT 0.4  --   ALKPHOS 105  --   ALT 101*  --   AST 129*  --   GLUCOSE 153* 159*   No results found for this basename: CHOL,  HDL,  LDLCALC,  TRIG   No results found for this basename: DDIMER    Radiology/Studies:  Dg Chest Portable 1 View  09/20/2013   CLINICAL DATA:  Status post intubation.  EXAM: PORTABLE CHEST - 1 VIEW  COMPARISON:  None.  FINDINGS: 0919 hrs. Endotracheal tube tip is 3.8 cm above the base of the carina. The NG tube passes into the stomach although the distal tip position is not included on the film. Lung volumes are low. The cardio pericardial silhouette is enlarged. Diffuse interstitial opacity is associated with vascular congestion. Telemetry leads overlie the chest. No evidence for pneumothorax or substantial pleural effusion.  IMPRESSION: Low volume  film with enlargement of the cardiopericardial silhouette. Vascular congestion with interstitial opacities suggest associated edema.   Electronically Signed   By: Kennith CenterEric  Mansell M.D.   On: 09/20/2013 09:30    EKG: Sinus rhythm with left bundle branch block  2D Echo: Study Conclusions  - Procedure narrative: Transthoracic echocardiography. Image quality was adequate. The study was technically difficult. - Left ventricle: Overall image quality very poor but no discreet RWMA seen The cavity size was normal. Wall thickness was increased in a pattern of moderate LVH. Systolic function was normal. The estimated ejection fraction was in the range of 50% to 55%. - Aortic valve: No good CW doppler may have mild AS but certainly not severe. - Mitral valve: Moderately  calcified, moderately thickened annulus. Mild regurgitation. - Left atrium: The atrium was mildly dilated. - Right ventricle: The cavity size was mildly dilated. Wall thickness was normal. - Right atrium: The atrium was mildly dilated. - Tricuspid valve: Moderate regurgitation. - Pulmonary arteries: PA peak pressure: 69mm Hg (S). - Impressions: PA pressures elevated by TR velocity Impressions:  - PA pressures elevated by TR velocity  ASSESSMENT AND PLAN:  68 year old woman with multiple medical problems including diabetes, end-stage renal disease, extensive vascular disease with history of left BKA, presenting with recurrent cardiac arrest. She had PEA with 20 minutes of CPR. There was no evidence of high-grade heart block. There is no ST segment elevation on her EKG. She has a left bundle branch block with no change from her previous EKG tracings.  A stat bedside echocardiogram was performed to rule out severe segmental LV dysfunction or echo evidence of massive pulmonary embolus. This was reviewed and there is normal LV function and no evidence of severe RV dilatation or dysfunction. The patient has known pulmonary HTN and this  is longstanding.   I would not recommend immediate coronary angiography. The patient has known extensive distal vessel disease dating back 4 years. At that time her disease pattern was not felt to be amenable to PCI or CABG. The cause of her cardiac arrest is not entirely clear. There is no echo evidence of hemodynamically significant pulmonary embolus. There is no LV dysfunction to suggest acute myocardial infarction. Would recommend supportive care at this point. It would be reasonable to systemically anticoagulate her with IV heparin unless contraindicated with her recent GI bleed in December. Will cycle enzymes but expect them to be markedly elevated after prolonged CPR and in presence of ESRD.  Enzo Bi  09/20/2013, 11:41 AM

## 2013-09-20 NOTE — Progress Notes (Signed)
EEG Completed; Results Pending  

## 2013-09-20 NOTE — ED Notes (Signed)
PCCM at the bedside 

## 2013-09-20 NOTE — Consult Note (Signed)
NEURO HOSPITALIST CONSULT NOTE    Reason for Consult: Cardiac arrest  HPI:                                                                                                                                          Briana Gould is an 68 y.o. female who has been seen in December for similar episodes during dialysis sessions of presyncope +/- hypotension and syncope versus seizures. Prior EEG's at that time were unremarkable.  Patient subsequently had cardiac arrest 15 min into inpatient HD. 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  Patient underwent the stroke workup.  MRA showing Significant intracranial atherosclerotic type changes. Carotid dopplers showing 60-79% right ICA stenosis, borderline >80%. Severe calcific plaque noted at origin of right ICA. 1-39% left ICA stenosis. At that time given multiple stenosis, patient is not a surgical/invervention candidate.  She was placed on coumadin but subsequently developed a GI bleed and was taken off all antiplatelet and anticoagulants.    Patient was at dialysis today when she went unresponsive and lost pulse. Per EMS, the patient was 20 minutes into her dialysis treatment. She had CPR for approximately 20 minutes before obtaining ROSC. King airway was placed. The patient received no ACLS drugs pre-hospital. Echo was obtained at bedside and showed normal LV function.  Currently she is intubated and on fentanyl, breathing over the ventilator and intermittently flexing bilateral arms.   Past Medical History  Diagnosis Date  . End stage renal disease on dialysis   . Peripheral arterial occlusive disease   . Hypertension   . Cardiac arrest   . Diabetes mellitus type 2, uncontrolled, with complications   . DVT (deep venous thrombosis)      Right internal jugular vein  . CAD (coronary artery disease), native coronary artery 2011    Diffuse distal vessel CAD noted at cardiac catheterization    Past Surgical History   Procedure Laterality Date  . Below knee leg amputation Left   . Av fistula placement Left    Family History: unable to obtain   Social History:  has no tobacco, alcohol, and drug history on file.  No Known Allergies  MEDICATIONS:  Prior to Admission:  Prescriptions prior to admission  Medication Sig Dispense Refill  . amLODipine (NORVASC) 5 MG tablet Take 5 mg by mouth daily.      Marland Kitchen b complex-vitamin c-folic acid (NEPHRO-VITE) 0.8 MG TABS tablet Take 1 tablet by mouth daily.      Marland Kitchen doxazosin (CARDURA) 8 MG tablet Take 8 mg by mouth daily.      . famotidine (PEPCID) 40 MG tablet Take 40 mg by mouth daily.      . hydrALAZINE (APRESOLINE) 50 MG tablet Take 50 mg by mouth 2 (two) times daily.      Marland Kitchen lisinopril (PRINIVIL,ZESTRIL) 40 MG tablet Take 40 mg by mouth daily.      . Nutritional Supplements (FEEDING SUPPLEMENT, NEPRO CARB STEADY,) LIQD Take 237 mLs by mouth daily.      . sevelamer carbonate (RENVELA) 800 MG tablet Take 1,600 mg by mouth 3 (three) times daily with meals.       . traMADol (ULTRAM) 50 MG tablet Take 50 mg by mouth every 12 (twelve) hours as needed (for pain).        Scheduled: . ampicillin-sulbactam (UNASYN) IV  3 g Intravenous Q12H  . pantoprazole (PROTONIX) IV  40 mg Intravenous QHS   Continuous: . sodium chloride 10 mL/hr (09/20/13 1345)  . fentaNYL infusion INTRAVENOUS 50 mcg/hr (09/20/13 1345)  . niCARDipine       ROS:                                                                                                                                       History obtained from unobtainable from patient due to mental status    Blood pressure 152/59, pulse 85, temperature 94.6 F (34.8 C), temperature source Rectal, resp. rate 20, height 5' 2"  (1.575 m), weight 83.5 kg (184 lb 1.4 oz), SpO2 99.00%.   Neurologic Examination:                                                                                                       Mental Status: Patient does not respond to verbal stimuli.   deep sternal rub patient will flex bilateral arms toward midline with flexion of her wrist.  Does not follow commands.  No verbalizations noted.  Cranial Nerves: II: patient does not respond confrontation bilaterally, pupils right 2 mm, left 3 mm irregular secondary to surgery.  Right is sluggishly reactive III,IV,VI: doll's response intact more on the left bilaterally.  V,VII: corneal reflex present  bilaterally  VIII: patient does not respond to verbal stimuli IX,X: gag reflex present, XI: trapezius strength unable to test bilaterally XII: tongue strength unable to test Motor: Extremities flaccid throughout.  With sternal rub she will show bilateral arm flexion to midline and slight grimace. She will do this also spontaneously when gaging on vent.   No purposeful movements noted. Sensory: Does not respond to noxious stimuli in any extremity. Deep Tendon Reflexes:  2+ bilateral UE and KJ no AJ,  Left BKA. Plantars: upgoing on the right Cerebellar: Unable to perform    No results found for this basename: cbc, bmp, coags, chol, tri, ldl, hga1c    Results for orders placed during the hospital encounter of 09/20/13 (from the past 48 hour(s))  CBC WITH DIFFERENTIAL     Status: Abnormal   Collection Time    09/20/13  9:13 AM      Result Value Range   WBC 10.1  4.0 - 10.5 K/uL   RBC 3.48 (*) 3.87 - 5.11 MIL/uL   Hemoglobin 11.2 (*) 12.0 - 15.0 g/dL   HCT 33.5 (*) 36.0 - 46.0 %   MCV 96.3  78.0 - 100.0 fL   MCH 32.2  26.0 - 34.0 pg   MCHC 33.4  30.0 - 36.0 g/dL   RDW 18.8 (*) 11.5 - 15.5 %   Platelets 92 (*) 150 - 400 K/uL   Comment: SPECIMEN CHECKED FOR CLOTS     REPEATED TO VERIFY     PLATELETS APPEAR DECREASED     PLATELET COUNT CONFIRMED BY SMEAR   Neutrophils Relative % 81 (*) 43 - 77 %   Neutro Abs 8.2 (*) 1.7 - 7.7 K/uL    Lymphocytes Relative 13  12 - 46 %   Lymphs Abs 1.4  0.7 - 4.0 K/uL   Monocytes Relative 3  3 - 12 %   Monocytes Absolute 0.3  0.1 - 1.0 K/uL   Eosinophils Relative 2  0 - 5 %   Eosinophils Absolute 0.2  0.0 - 0.7 K/uL   Basophils Relative 0  0 - 1 %   Basophils Absolute 0.0  0.0 - 0.1 K/uL  PRO B NATRIURETIC PEPTIDE     Status: Abnormal   Collection Time    09/20/13  9:13 AM      Result Value Range   Pro B Natriuretic peptide (BNP) 57457.0 (*) 0 - 125 pg/mL  COMPREHENSIVE METABOLIC PANEL     Status: Abnormal   Collection Time    09/20/13  9:13 AM      Result Value Range   Sodium 140  137 - 147 mEq/L   Potassium 4.5  3.7 - 5.3 mEq/L   Chloride 94 (*) 96 - 112 mEq/L   CO2 23  19 - 32 mEq/L   Glucose, Bld 153 (*) 70 - 99 mg/dL   BUN 38 (*) 6 - 23 mg/dL   Creatinine, Ser 7.22 (*) 0.50 - 1.10 mg/dL   Calcium 9.4  8.4 - 10.5 mg/dL   Total Protein 7.0  6.0 - 8.3 g/dL   Albumin 3.4 (*) 3.5 - 5.2 g/dL   AST 129 (*) 0 - 37 U/L   Comment: HEMOLYSIS AT THIS LEVEL MAY AFFECT RESULT   ALT 101 (*) 0 - 35 U/L   Alkaline Phosphatase 105  39 - 117 U/L   Total Bilirubin 0.4  0.3 - 1.2 mg/dL   GFR calc non Af Amer 5 (*) >90 mL/min   GFR calc Af  Amer 6 (*) >90 mL/min   Comment: (NOTE)     The eGFR has been calculated using the CKD EPI equation.     This calculation has not been validated in all clinical situations.     eGFR's persistently <90 mL/min signify possible Chronic Kidney     Disease.  POCT I-STAT, CHEM 8     Status: Abnormal   Collection Time    09/20/13  9:14 AM      Result Value Range   Sodium 138  137 - 147 mEq/L   Potassium 4.3  3.7 - 5.3 mEq/L   Chloride 100  96 - 112 mEq/L   BUN 44 (*) 6 - 23 mg/dL   Creatinine, Ser 7.60 (*) 0.50 - 1.10 mg/dL   Glucose, Bld 159 (*) 70 - 99 mg/dL   Calcium, Ion 1.09 (*) 1.13 - 1.30 mmol/L   TCO2 27  0 - 100 mmol/L   Hemoglobin 12.6  12.0 - 15.0 g/dL   HCT 37.0  36.0 - 46.0 %  POCT I-STAT TROPONIN I     Status: Abnormal   Collection  Time    09/20/13  9:16 AM      Result Value Range   Troponin i, poc 0.09 (*) 0.00 - 0.08 ng/mL   Comment NOTIFIED PHYSICIAN     Comment 3            Comment: Due to the release kinetics of cTnI,     a negative result within the first hours     of the onset of symptoms does not rule out     myocardial infarction with certainty.     If myocardial infarction is still suspected,     repeat the test at appropriate intervals.  CG4 I-STAT (LACTIC ACID)     Status: Abnormal   Collection Time    09/20/13  9:18 AM      Result Value Range   Lactic Acid, Venous 3.18 (*) 0.5 - 2.2 mmol/L  POCT I-STAT 3, BLOOD GAS (G3+)     Status: Abnormal   Collection Time    09/20/13  9:51 AM      Result Value Range   pH, Arterial 7.367  7.350 - 7.450   pCO2 arterial 46.2 (*) 35.0 - 45.0 mmHg   pO2, Arterial 281.0 (*) 80.0 - 100.0 mmHg   Bicarbonate 27.1 (*) 20.0 - 24.0 mEq/L   TCO2 29  0 - 100 mmol/L   O2 Saturation 100.0     Acid-Base Excess 1.0  0.0 - 2.0 mmol/L   Patient temperature 35.0 C     Collection site ARTERIAL LINE     Drawn by Operator     Sample type ARTERIAL    COMPREHENSIVE METABOLIC PANEL     Status: Abnormal   Collection Time    09/20/13 11:55 AM      Result Value Range   Sodium 140  137 - 147 mEq/L   Potassium 4.4  3.7 - 5.3 mEq/L   Chloride 98  96 - 112 mEq/L   CO2 21  19 - 32 mEq/L   Glucose, Bld 240 (*) 70 - 99 mg/dL   BUN 41 (*) 6 - 23 mg/dL   Creatinine, Ser 6.92 (*) 0.50 - 1.10 mg/dL   Calcium 9.0  8.4 - 10.5 mg/dL   Total Protein 6.7  6.0 - 8.3 g/dL   Albumin 3.2 (*) 3.5 - 5.2 g/dL   AST 121 (*) 0 - 37 U/L  ALT 96 (*) 0 - 35 U/L   Alkaline Phosphatase 101  39 - 117 U/L   Total Bilirubin 0.6  0.3 - 1.2 mg/dL   GFR calc non Af Amer 5 (*) >90 mL/min   GFR calc Af Amer 6 (*) >90 mL/min   Comment: (NOTE)     The eGFR has been calculated using the CKD EPI equation.     This calculation has not been validated in all clinical situations.     eGFR's persistently <90  mL/min signify possible Chronic Kidney     Disease.  MAGNESIUM     Status: None   Collection Time    09/20/13 11:55 AM      Result Value Range   Magnesium 2.4  1.5 - 2.5 mg/dL  PHOSPHORUS     Status: Abnormal   Collection Time    09/20/13 11:55 AM      Result Value Range   Phosphorus 5.0 (*) 2.3 - 4.6 mg/dL  AMYLASE     Status: Abnormal   Collection Time    09/20/13 11:55 AM      Result Value Range   Amylase 132 (*) 0 - 105 U/L  TROPONIN I     Status: Abnormal   Collection Time    09/20/13 11:55 AM      Result Value Range   Troponin I 0.54 (*) <0.30 ng/mL   Comment:            Due to the release kinetics of cTnI,     a negative result within the first hours     of the onset of symptoms does not rule out     myocardial infarction with certainty.     If myocardial infarction is still suspected,     repeat the test at appropriate intervals.     CRITICAL RESULT CALLED TO, READ BACK BY AND VERIFIED WITH:     V BEASLEY,RN 1302 09/20/13 WBOND  PROCALCITONIN     Status: None   Collection Time    09/20/13 11:55 AM      Result Value Range   Procalcitonin 0.52     Comment:            Interpretation:     PCT > 0.5 ng/mL and <= 2 ng/mL:     Systemic infection (sepsis) is possible,     but other conditions are known to elevate     PCT as well.     (NOTE)             ICU PCT Algorithm               Non ICU PCT Algorithm        ----------------------------     ------------------------------             PCT < 0.25 ng/mL                 PCT < 0.1 ng/mL         Stopping of antibiotics            Stopping of antibiotics           strongly encouraged.               strongly encouraged.        ----------------------------     ------------------------------           PCT level decrease by  PCT < 0.25 ng/mL           >= 80% from peak PCT           OR PCT 0.25 - 0.5 ng/mL          Stopping of antibiotics                                                 encouraged.          Stopping of antibiotics               encouraged.        ----------------------------     ------------------------------           PCT level decrease by              PCT >= 0.25 ng/mL           < 80% from peak PCT            AND PCT >= 0.5 ng/mL            Continuing antibiotics                                                  encouraged.           Continuing antibiotics                encouraged.        ----------------------------     ------------------------------         PCT level increase compared          PCT > 0.5 ng/mL             with peak PCT AND              PCT >= 0.5 ng/mL             Escalation of antibiotics                                              strongly encouraged.          Escalation of antibiotics            strongly encouraged.  PRO B NATRIURETIC PEPTIDE     Status: Abnormal   Collection Time    09/20/13 11:55 AM      Result Value Range   Pro B Natriuretic peptide (BNP) 47172.0 (*) 0 - 125 pg/mL  PROTIME-INR     Status: None   Collection Time    09/20/13 11:55 AM      Result Value Range   Prothrombin Time 14.8  11.6 - 15.2 seconds   INR 1.19  0.00 - 1.49  APTT     Status: Abnormal   Collection Time    09/20/13 11:55 AM      Result Value Range   aPTT 39 (*) 24 - 37 seconds   Comment:            IF BASELINE aPTT IS ELEVATED,     SUGGEST PATIENT RISK ASSESSMENT     BE  USED TO DETERMINE APPROPRIATE     ANTICOAGULANT THERAPY.  TYPE AND SCREEN     Status: None   Collection Time    09/20/13 11:55 AM      Result Value Range   ABO/RH(D) O POS     Antibody Screen NEG     Sample Expiration 09/23/2013    ABO/RH     Status: None   Collection Time    09/20/13 11:55 AM      Result Value Range   ABO/RH(D) O POS    MRSA PCR SCREENING     Status: None   Collection Time    09/20/13  1:06 PM      Result Value Range   MRSA by PCR NEGATIVE  NEGATIVE   Comment:            The GeneXpert MRSA Assay (FDA     approved for NASAL specimens     only), is one  component of a     comprehensive MRSA colonization     surveillance program. It is not     intended to diagnose MRSA     infection nor to guide or     monitor treatment for     MRSA infections.  GLUCOSE, CAPILLARY     Status: Abnormal   Collection Time    09/20/13  1:44 PM      Result Value Range   Glucose-Capillary 222 (*) 70 - 99 mg/dL    Ct Head Wo Contrast  09/20/2013   CLINICAL DATA:  Altered mental status ; status post cardiopulmonary resuscitation  EXAM: CT HEAD WITHOUT CONTRAST  TECHNIQUE: Contiguous axial images were obtained from the base of the skull through the vertex without intravenous contrast.  COMPARISON:  None.  FINDINGS: There is age related volume loss. There is no demonstrable mass, hemorrhage, extra-axial fluid collection, or midline shift. There is mild small vessel disease in the centra semiovale bilaterally. There is decreased attenuation in the midline of the upper to mid pons which may represent a small recent infarct in this area. No other acute appearing infarct is appreciable.  Bony calvarium appears intact.  The mastoid air cells are clear.  IMPRESSION: Age uncertain small infarct mid pons. Small infarct in this area may be recent.  There is underlying age related volume loss with mild periventricular small vessel disease. There is no intracranial mass or hemorrhage. No gray-white compartment edema is appreciable on this study.   Electronically Signed   By: Lowella Grip M.D.   On: 09/20/2013 12:54   Dg Chest Port 1 View  09/20/2013   CLINICAL DATA:  Post IJ placement  EXAM: PORTABLE CHEST - 1 VIEW  COMPARISON:  Prior chest x-ray 09/20/2013 at 9:19 a.m.  FINDINGS: Interval placement of a left IJ approach central venous catheter. The tip of the catheter is in good position at the superior cavoatrial junction. No evidence of complicating pneumothorax. The patient remains intubated. The tip of the endotracheal tube is 4 cm above the carina. A nasogastric tube is  present, the tip lies off the field of view presumably within the stomach. External defibrillator pads and metallic artifacts overlie the chest. Stable cardiomegaly, very low inspiratory volumes and mild pulmonary edema.  IMPRESSION: 1. New left IJ approach central venous catheter with the tip at the superior cavoatrial junction. No evidence of pneumothorax or other complication. 2. Otherwise, no significant interval change in the appearance of the chest.   Electronically Signed   By: Myrle Sheng  Laurence Ferrari M.D.   On: 09/20/2013 11:48   Dg Chest Portable 1 View  09/20/2013   CLINICAL DATA:  Status post intubation.  EXAM: PORTABLE CHEST - 1 VIEW  COMPARISON:  None.  FINDINGS: 0919 hrs. Endotracheal tube tip is 3.8 cm above the base of the carina. The NG tube passes into the stomach although the distal tip position is not included on the film. Lung volumes are low. The cardio pericardial silhouette is enlarged. Diffuse interstitial opacity is associated with vascular congestion. Telemetry leads overlie the chest. No evidence for pneumothorax or substantial pleural effusion.  IMPRESSION: Low volume film with enlargement of the cardiopericardial silhouette. Vascular congestion with interstitial opacities suggest associated edema.   Electronically Signed   By: Misty Stanley M.D.   On: 09/20/2013 09:30    Assessment and plan per attending neurologist  Etta Quill PA-C [facility] [phone]  09/20/2013, 3:29 PM   Assessment/Plan: 68 YO female S/P cardiac arrest while in dialysis. CPR for 20 minutes with ROSC and no cardiac medications. Although she has known diffuse intracranial atherosclerotic disease there is no clear indication that this is a significant factor in patient's presentation today with cardiac arrest. 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  Recommend:  1) EEG, routine adult study.  2) MRI of the brain when feasible to rule  out acute stroke 3) no changes in current management recommended otherwise.  We will continue to follow this patient with you.  I personally participate in this patient's evaluation and management including formulating the above clinical impression and management recommendations.  Rush Farmer M.D. [facility] [phone]

## 2013-09-20 NOTE — ED Notes (Signed)
Cardiology at the bedside.

## 2013-09-20 NOTE — Care Management Note (Addendum)
    Page 1 of 2   10/11/2013     4:15:03 PM   CARE MANAGEMENT NOTE 10/11/2013  Patient:  Briana RiceVALDES,Brissa   Account Number:  [id number]  Date Initiated:  09/20/2013  Documentation initiated by:  Junius CreamerWELL,DEBBIE  Subjective/Objective Assessment:   adm w cardiac arrest, vent     Action/Plan:   lives w husb, goes to hd for esrd   Anticipated DC Date:     Anticipated DC Plan:  HOME W HOME HEALTH SERVICES      DC Planning Services  CM consult      [facility]  Resumption Of Svcs/PTA Provider   Choice offered to / List presented to:             Status of service:   Medicare Important Message given?   (If response is "NO", the following Medicare IM given date fields will be blank) Date Medicare IM given:   Date Additional Medicare IM given:    Discharge Disposition:  IP REHAB FACILITY  Per UR Regulation:  Reviewed for med. necessity/level of care/duration of stay  If discussed at Long Length of Stay Meetings, dates discussed:   09/28/2013  09/30/2013  10/05/2013  10/07/2013  10/12/2013    Comments:  10-11-13 1611 Tomi BambergerBrenda Graves-Bigelow, RN,BSN [phone] Pt  s/p 2 units PRBC on 1-30 d/t low hgb. Pt in HD at this time. Per Surgery pt is not a candidate for PD catheter. Per MD plan will now be to consult cardiology to see if pt is a candidate for possible pacemaker. CM will continue to monitor.   10-07-13 1038 Tomi BambergerBrenda Graves-Bigelow, [place] [phone] Pt was scheduled for cerebral arteriogram with L ICA angioplasty stent this am. Has been on Plavix 75 mg daily since 1/24 P2Y12- Rechecked this am and still at 351---still too high to safely proceed. Procedure cancelled this am. CM will continue to monitor.  10-06-13 1407 Plan for intracranial angiogram and or possible stent 1-29. Plan for  plavix and aspirin prior to procedure. Monitoring CBC. Per MD notes:  Patient needs to be on plavix and aspirin for procedure for at least 5 days. -Day 4 plavix, aspirin. Disposition plan hopefully for CIR  when medically stable. CM will continue to monitor for disposition needs.  09-30-13 1412 Tomi BambergerBrenda Graves- Bigelow, RN, BSN [phone] CM did speak to MD and she wants to see if pt is eligible for CIR vs LTAC vs home vs SNF. [facility] and she will f/u with pt. CM also called Amparo BristolMike Liaison for [facility] to see if pt is eligible for LTAC. Per MD Note:" Plan for intracranial angiogram and or possible stent. Dr Elnoria HowardHung Consulted to give us recommendation regarding starting patient on plavix and aspirin. Patient needs to be on plavix and aspirin for procedure for at least 5 days. Patient with prior history of GI bleed." CM will continue to monitor for disposition needs.  09-29-13 [address] Graves-Bigelow, [place] [phone] CM did speak to pt and she [facility] CM did call CSW to make her aware. CM will also continue to monitor for home needs.   09-27-13 [address] GravesMitzie Na- Bigelow, [place]BSN [phone] Plan for HD today and then IR cerebral angiogram hopefully 09-28-13. Plan will be for home when medically stable for d/c. CM will continue to monitor.  1/14 1423 debbie dowell rn,bsn [facility] called and they are act w pt for hhrn.

## 2013-09-20 NOTE — ED Notes (Signed)
Pt to department via EMS from dialysis- Pt was 20 minutes into dialysis and went unresponsive. Pt arrested with 20 minutes of CPR performed with return off rhythm. Pt intubated with king airway in the field. Give 5mg  versed. Bp-180/66 Hr-70 Cbg-166. Cold saline started in route. IO to right leg.

## 2013-09-20 NOTE — Procedures (Signed)
Arterial Catheter Insertion Procedure Note Briana Gould [id number] 19-Sep-1945  Procedure: Insertion of Arterial Catheter  Indications: Frequent blood sampling  Procedure Details Consent: Unable to obtain consent because of emergent medical necessity. Time Out: Verified patient identification, verified procedure, site/side was marked, verified correct patient position, special equipment/implants available, medications/allergies/relevent history reviewed, required imaging and test results available.  Performed  Maximum sterile technique was used including antiseptics, cap, gloves, gown, hand hygiene, mask and sheet. Skin prep: Chlorhexidine; local anesthetic administered 20 gauge catheter was inserted into right radial artery using the Seldinger technique.  Evaluation Blood flow good; BP tracing good. Complications: No apparent complications.   Ok AnisKelly Smith, MA 09/20/2013

## 2013-09-20 NOTE — Procedures (Addendum)
Central Venous Catheter Insertion Procedure Note Briana Gould [id number] 12-27-1945  Procedure: Insertion of Central Venous Catheter Indications: Assessment of intravascular volume, Drug and/or fluid administration and Frequent blood sampling  Procedure Details Consent: Risks of procedure as well as the alternatives and risks of each were explained to the (patient/caregiver).  Consent for procedure obtained. Time Out: Verified patient identification, verified procedure, site/side was marked, verified correct patient position, special equipment/implants available, medications/allergies/relevent history reviewed, required imaging and test results available.  Performed  Maximum sterile technique was used including antiseptics, cap, gloves, gown, hand hygiene, mask and sheet. Skin prep: Chlorhexidine; local anesthetic administered A antimicrobial bonded/coated triple lumen catheter was placed in the left internal jugular vein using the Seldinger technique.  Evaluation Blood flow good Complications: No apparent complications Patient did tolerate procedure well. Chest X-ray ordered to verify placement.  CXR: pending.  BABCOCK,PETE 09/20/2013, 3:38 PM  PCCM ATTENDING: I was present for and supervised the entire procedure  Billy Fischeravid Simonds, MD ; [facility] service Mobile [phone].  After 5:30 PM or weekends, call [phone]

## 2013-09-20 NOTE — Progress Notes (Signed)
Pt came to ED via EMS from dialysis- Pt was 20 minutes into dialysis and went unresponsive. Pt arrested with 20 minutes of CPR performed. Pt intubated. Explored emotional and ,spiritual needs. Provided emotional and spiritual support to family. Prayed with family per their request. Accompanied  Critical Doctors to consultation room for patient update. Patient being admitted.  09/20/13 1100  Clinical Encounter Type  Visited With Patient;Family;Health care provider  Visit Type Spiritual support;Critical Care;ED;Trauma  Referral From Nurse  Spiritual Encounters  Spiritual Needs Prayer;Emotional  Stress Factors  Family Stress Factors Health changes  Venida JarvisWatlington, Darriona Dehaas, [place], [phone]

## 2013-09-20 NOTE — ED Notes (Signed)
Results of troponin shown to Dr. Gwendolyn GrantWalden

## 2013-09-20 NOTE — ED Provider Notes (Signed)
CSN: [id number]     Arrival date & time 09/20/13  0907 History   First MD Initiated Contact with Patient 09/20/13 0919     Chief Complaint  Patient presents with  . Cardiac Arrest   HPI  68 y/o female with history of ESRD who presents from dialysis for cardiac arrest. Per EMS, the patient was 20 minutes into her dialysis treatment when she went unresponsive and lost pulses. She had CPR for approximately 20 minutes before obtaining ROSC. King airway was placed. The patient received no ACLS drugs pre-hospital. An IO was also placed and she was given 5 mg of versed. Level 5 caveat secondary to unresponsive status.   Past Medical History  Diagnosis Date  . End stage renal disease on dialysis   . Peripheral arterial occlusive disease   . Hypertension   . Cardiac arrest   . Diabetes mellitus type 2, uncontrolled, with complications   . DVT (deep venous thrombosis)      Right internal jugular vein  . CAD (coronary artery disease), native coronary artery 2011    Diffuse distal vessel CAD noted at cardiac catheterization   Past Surgical History  Procedure Laterality Date  . Below knee leg amputation Left   . Av fistula placement Left    No family history on file.  History  Substance Use Topics  . Smoking status: Not on file  . Smokeless tobacco: Not on file  . Alcohol Use: Not on file   OB History   Grav Para Term Preterm Abortions TAB SAB Ect Mult Living                 Review of Systems  Unable to perform ROS: Patient unresponsive   Allergies  Review of patient's allergies indicates no known allergies.  Home Medications  No current outpatient prescriptions on file. BP 143/54  Pulse 84  Temp(Src) 94.6 F (34.8 C) (Rectal)  Resp 24  Ht 5\' 2"  (1.575 m)  Wt 184 lb 1.4 oz (83.5 kg)  BMI 33.66 kg/m2  SpO2 100% Physical Exam  Nursing note and vitals reviewed. Constitutional: She is oriented to person, place, and time. She appears well-developed.  HENT:  Head:  Normocephalic and atraumatic.  Eyes: Conjunctivae are normal. Pupils are equal, round, and reactive to light.  Neck: Normal range of motion. Neck supple.  King Airway In place  Cardiovascular: Normal rate and regular rhythm.  Exam reveals no gallop and no friction rub.   No murmur heard. Pulmonary/Chest: Effort normal and breath sounds normal.  Abdominal: Soft. She exhibits no distension.  Musculoskeletal: Normal range of motion. She exhibits no edema.  Left AKA  Neurological: She is alert and oriented to person, place, and time.  Skin: Skin is warm and dry.  Psychiatric: She has a normal mood and affect.   ED Course  INTUBATION Date/Time: 09/20/2013 10:10 AM Performed by: Shanon Ace Authorized by: Shanon Ace Consent: The procedure was performed in an emergent situation. Patient identity confirmed: hospital-assigned identification number Indications: respiratory failure Intubation method: video-assisted Patient status: paralyzed (RSI) Sedatives: etomidate Paralytic: rocuronium Laryngoscope size: Glidescope blade 4. Tube size: 7.5 mm Tube type: cuffed Number of attempts: 1 Cords visualized: yes Post-procedure assessment: chest rise and ETCO2 monitor Breath sounds: equal and absent over the epigastrium Cuff inflated: yes ETT to lip: 23 cm ETT to teeth: 22 cm Tube secured with: ETT holder Chest x-ray findings: endotracheal tube in appropriate position Patient tolerance: Patient tolerated the procedure well with no immediate complications.   (  including critical care time) Labs Review Labs Reviewed  CBC WITH DIFFERENTIAL - Abnormal; Notable for the following:    RBC 3.48 (*)    Hemoglobin 11.2 (*)    HCT 33.5 (*)    RDW 18.8 (*)    Platelets 92 (*)    Neutrophils Relative % 81 (*)    Neutro Abs 8.2 (*)    All other components within normal limits  PRO B NATRIURETIC PEPTIDE - Abnormal; Notable for the following:    Pro B Natriuretic peptide (BNP) 57457.0  (*)    All other components within normal limits  COMPREHENSIVE METABOLIC PANEL - Abnormal; Notable for the following:    Chloride 94 (*)    Glucose, Bld 153 (*)    BUN 38 (*)    Creatinine, Ser 7.22 (*)    Albumin 3.4 (*)    AST 129 (*)    ALT 101 (*)    GFR calc non Af Amer 5 (*)    GFR calc Af Amer 6 (*)    All other components within normal limits  COMPREHENSIVE METABOLIC PANEL - Abnormal; Notable for the following:    Glucose, Bld 240 (*)    BUN 41 (*)    Creatinine, Ser 6.92 (*)    Albumin 3.2 (*)    AST 121 (*)    ALT 96 (*)    GFR calc non Af Amer 5 (*)    GFR calc Af Amer 6 (*)    All other components within normal limits  PHOSPHORUS - Abnormal; Notable for the following:    Phosphorus 5.0 (*)    All other components within normal limits  AMYLASE - Abnormal; Notable for the following:    Amylase 132 (*)    All other components within normal limits  TROPONIN I - Abnormal; Notable for the following:    Troponin I 0.54 (*)    All other components within normal limits  PRO B NATRIURETIC PEPTIDE - Abnormal; Notable for the following:    Pro B Natriuretic peptide (BNP) 47172.0 (*)    All other components within normal limits  APTT - Abnormal; Notable for the following:    aPTT 39 (*)    All other components within normal limits  POCT I-STAT, CHEM 8 - Abnormal; Notable for the following:    BUN 44 (*)    Creatinine, Ser 7.60 (*)    Glucose, Bld 159 (*)    Calcium, Ion 1.09 (*)    All other components within normal limits  CG4 I-STAT (LACTIC ACID) - Abnormal; Notable for the following:    Lactic Acid, Venous 3.18 (*)    All other components within normal limits  POCT I-STAT TROPONIN I - Abnormal; Notable for the following:    Troponin i, poc 0.09 (*)    All other components within normal limits  POCT I-STAT 3, BLOOD GAS (G3+) - Abnormal; Notable for the following:    pCO2 arterial 46.2 (*)    pO2, Arterial 281.0 (*)    Bicarbonate 27.1 (*)    All other  components within normal limits  MRSA PCR SCREENING  MAGNESIUM  PROCALCITONIN  PROTIME-INR  BLOOD GAS, ARTERIAL  TROPONIN I  TROPONIN I  TYPE AND SCREEN  ABO/RH   Imaging Review Ct Head Wo Contrast  09/20/2013   CLINICAL DATA:  Altered mental status ; status post cardiopulmonary resuscitation  EXAM: CT HEAD WITHOUT CONTRAST  TECHNIQUE: Contiguous axial images were obtained from the base of the skull through  the vertex without intravenous contrast.  COMPARISON:  None.  FINDINGS: There is age related volume loss. There is no demonstrable mass, hemorrhage, extra-axial fluid collection, or midline shift. There is mild small vessel disease in the centra semiovale bilaterally. There is decreased attenuation in the midline of the upper to mid pons which may represent a small recent infarct in this area. No other acute appearing infarct is appreciable.  Bony calvarium appears intact.  The mastoid air cells are clear.  IMPRESSION: Age uncertain small infarct mid pons. Small infarct in this area may be recent.  There is underlying age related volume loss with mild periventricular small vessel disease. There is no intracranial mass or hemorrhage. No gray-white compartment edema is appreciable on this study.   Electronically Signed   By: Bretta Bang M.D.   On: 09/20/2013 12:54   Dg Chest Port 1 View  09/20/2013   CLINICAL DATA:  Post IJ placement  EXAM: PORTABLE CHEST - 1 VIEW  COMPARISON:  Prior chest x-ray 09/20/2013 at 9:19 a.m.  FINDINGS: Interval placement of a left IJ approach central venous catheter. The tip of the catheter is in good position at the superior cavoatrial junction. No evidence of complicating pneumothorax. The patient remains intubated. The tip of the endotracheal tube is 4 cm above the carina. A nasogastric tube is present, the tip lies off the field of view presumably within the stomach. External defibrillator pads and metallic artifacts overlie the chest. Stable cardiomegaly, very  low inspiratory volumes and mild pulmonary edema.  IMPRESSION: 1. New left IJ approach central venous catheter with the tip at the superior cavoatrial junction. No evidence of pneumothorax or other complication. 2. Otherwise, no significant interval change in the appearance of the chest.   Electronically Signed   By: Malachy Moan M.D.   On: 09/20/2013 11:48   Dg Chest Portable 1 View  09/20/2013   CLINICAL DATA:  Status post intubation.  EXAM: PORTABLE CHEST - 1 VIEW  COMPARISON:  None.  FINDINGS: 0919 hrs. Endotracheal tube tip is 3.8 cm above the base of the carina. The NG tube passes into the stomach although the distal tip position is not included on the film. Lung volumes are low. The cardio pericardial silhouette is enlarged. Diffuse interstitial opacity is associated with vascular congestion. Telemetry leads overlie the chest. No evidence for pneumothorax or substantial pleural effusion.  IMPRESSION: Low volume film with enlargement of the cardiopericardial silhouette. Vascular congestion with interstitial opacities suggest associated edema.   Electronically Signed   By: Kennith Center M.D.   On: 09/20/2013 09:30    EKG Interpretation    Date/Time:  Monday September 20 2013 09:10:15 EST Ventricular Rate:  75 PR Interval:    QRS Duration: 129 QT Interval:  475 QTC Calculation: 531 R Axis:   -40 Text Interpretation:  Age not entered, assumed to be  68 years old for purpose of ECG interpretation Accelerated junctional rhythm Left bundle branch block Baseline wander in lead(s) V6 No prior EKG to compare to Confirmed by [facility]  MD, BLAIR ([id number]) on 09/20/2013 9:22:02 AM           MDM   1. Cardiac arrest     Came in post arrest. History limited. PEA when EMS arrived but obtained ROSC in route without medications. ABC's intact on arrival. [place] airway replaced with ETT without difficulty. EKG with LBBB. No previous to compare it to. Both cardiology and PCCM consulted. Cooling protocol  continued. Normal K+. Lactate mildly  elevated. Hemoglobin stable. Initial troponin of 0.09. Unclear etiology behind arrest. The patient was admitted to the ICU in HDS condition.      Shanon Ace, MD 09/20/13 [phone]

## 2013-09-20 NOTE — Progress Notes (Addendum)
30cc of versed wasted in sink; witnessed by 2nd RN; Shearon BaloNicole Beasley, RN Witnessed P.Tatiyanna Lashley RN

## 2013-09-20 NOTE — ED Notes (Signed)
Lactic acid results shown to Dr. Walden 

## 2013-09-20 NOTE — Progress Notes (Signed)
  Echocardiogram 2D Echocardiogram has been performed.  Briana Gould 09/20/2013, 10:40 AM

## 2013-09-20 NOTE — H&P (Signed)
Name: Briana Gould MRN: [id number] DOB: 16-Dec-1945    ADMISSION DATE:  09/20/2013 CONSULTATION DATE:  1/12  PRIMARY SERVICE: PCCM   CHIEF COMPLAINT:  Cardiac arrest  BRIEF PATIENT DESCRIPTION:  68 year old female w/ ESRD, recently d/[facility] 12/21 after being treated for UGIB while on coumadin for Right IJ thrombus stroke prevention. Was in usual state of health, getting stronger after her d/c. On 1/12 was at regular scheduled HD. About 20 minutes into HD she slumped over and became unresponsive. Initial rhythm was reported as PEA. CPR started. Reported 20 minutes CPR before ROSC, no drugs. Transferred to [facility] s/p arrest.   SIGNIFICANT EVENTS / STUDIES:  Last admit  12/21- brady, BP wnl  12/22 egd - LA Grade A esophagitis, Hiatal hernia, Antral ulcers with patchy gastritis, Duodenal ulcers with duodenitis  12/22 big pause with egd, sedation, HD  12/23- requiring dopamine  12/23- EP not planning pacer, poor canddiate  12/24- dop remains  ------------------------------------------------------------------------------------------------------------------------------------------------------ 12/21: discharged s/p UGIB notable for bradycardia  1/12: cardiac arrest/ PEA at HD. 20 minutes estimated to ROSC 1/12 ECHO>>>  LINES / TUBES: OETT 1/12>>> Left IJ 1/12>>> Right radial 1/12>>> CULTURES: Sputum 1/12>>>  ANTIBIOTICS: unasyn 1/12>>>  HISTORY OF PRESENT ILLNESS:   68 year old female w/ ESRD, [facility] after being treated for UGIB while on coumadin for Right IJ thrombus stroke prevention. Hospital course notable for bradycardia requiring inotrope infusion. Seen by EP but PPM deferred as pt had in dwelling HD cath. Had been doing well at home, daughter states compliant w/  HD schedule and meds, staying close to dry weight. Was in usual state of health, getting stronger after her d/c. Went to scheduled HD the am of 1/12. About 20 minutes into HD she slumped  over and became unresponsive. Initial rhythm was reported as PEA. CPR started. Reported 20 minutes CPR before ROSC, no drugs. On EMS arrival [facility] airway placed. Transferred to ER at [facility] from [facility] center. EDP exchanged airway. PCCM asked to admit.   PAST MEDICAL HISTORY :   PAD (peripheral artery disease)    .  Type II diabetes mellitus    .  Anemia    .  History of blood transfusion  2002     "related to amputation" (08/13/2013)   .  End stage renal disease on dialysis      "[facility] M, [facility], F" (08/13/2013)   .  Cardiac arrest  08/14/13    Past Surgical History   Procedure  Laterality  Date   .  Av fistula placement  Left  2011     upper arm   .  Below knee leg amputation  Left  2002   .  Tonsillectomy     .  Cataract extraction w/ intraocular lens implant, bilateral  Bilateral  2012-2014   Last D/C  GIB: LA Grade A esophagitis. Hiatal hernia. Antral ulcers with patchy gastritis. Duodenal ulcers with duodenitis Anemia-Hemorrhagic, GI bleed, uremic  Bradycardia asymptomatic thus far  CAD  HTN  Pause with egd / sedation self resolved  Symptomatic brady 18 hrs 12/22-23->resolved  ESRD -getting HD per renal (M/W/F) DM  High stroke risk and R. IJ thrombus started on coumadin during recent admission 12/12.   Prior to Admission medications   .  darbepoetin  200 mcg  Intravenous  Q Mon-HD  .  hydrALAZINE  50 mg  Oral  TID  .  insulin aspart  0-15 Units  Subcutaneous  TID AC  .  pantoprazole  40 mg  Oral  BID AC     Allergies not on file  FAMILY HISTORY:  Family History   Problem  Relation  Age of Onset   .  Hypertension  Mother    .  Hypertension  Father    SOCIAL HISTORY:  reports that she has never smoked. She has never used smokeless tobacco. She reports that she does not drink alcohol or use illicit drugs.  REVIEW OF SYSTEMS:  Unable   SUBJECTIVE:   VITAL SIGNS: Temp:  [95 F (35 C)-96 F (35.6 C)] 95.2 F (35.1 C) (01/12 1000) Pulse  Rate:  [91-98] 95 (01/12 1000) Resp:  [12-15] 15 (01/12 1000) BP: (110-191)/(62-105) 185/78 mmHg (01/12 1000) SpO2:  [100 %] 100 % (01/12 1000) FiO2 (%):  [100 %] 100 % (01/12 0924) Weight:  [120 kg (264 lb 8.8 oz)] 120 kg (264 lb 8.8 oz) (01/12 0930) HEMODYNAMICS:   VENTILATOR SETTINGS: Vent Mode:  [-] PRVC FiO2 (%):  [100 %] 100 % Set Rate:  [14 bmp] 14 bmp Vt Set:  [450 mL] 450 mL PEEP:  [5 cmH20] 5 cmH20 Plateau Pressure:  [20 cmH20] 20 cmH20 INTAKE / OUTPUT: Intake/Output   None     PHYSICAL EXAMINATION: General:  Unresponsive s/p arrest.  Neuro:  Unresponsive GCS 3 HEENT:  Orally intubated. Left EJ cath in place. Right Thrombus noted via US  Cardiovascular:  Rrr, III/VI SEM  Lungs:  Scattered rhonchi Abdomen:  Soft, non-tender no OM  Musculoskeletal:  Left AKA looks unremarkable  Skin:  Intact   LABS:  CBC  Recent Labs Lab 09/20/13 0913 09/20/13 0914  WBC 10.1  --   HGB 11.2* 12.6  HCT 33.5* 37.0  PLT 92*  --    Coag's No results found for this basename: APTT, INR,  in the last 168 hours BMET  Recent Labs Lab 09/20/13 0913 09/20/13 0914  NA 140 138  K 4.5 4.3  CL 94* 100  CO2 23  --   BUN 38* 44*  CREATININE 7.22* 7.60*  GLUCOSE 153* 159*   Electrolytes  Recent Labs Lab 09/20/13 0913  CALCIUM 9.4   Sepsis Markers  Recent Labs Lab 09/20/13 0918  LATICACIDVEN 3.18*   ABG  Recent Labs Lab 09/20/13 0951  PHART 7.367  PCO2ART 46.2*  PO2ART 281.0*   Liver Enzymes  Recent Labs Lab 09/20/13 0913  AST 129*  ALT 101*  ALKPHOS 105  BILITOT 0.4  ALBUMIN 3.4*   Cardiac Enzymes  Recent Labs Lab 09/20/13 0913  PROBNP 57457.0*   Glucose No results found for this basename: GLUCAP,  in the last 168 hours  Imaging Dg Chest Portable 1 View  09/20/2013   CLINICAL DATA:  Status post intubation.  EXAM: PORTABLE CHEST - 1 VIEW  COMPARISON:  None.  FINDINGS: 0919 hrs. Endotracheal tube tip is 3.8 cm above the base of the carina.  The NG tube passes into the stomach although the distal tip position is not included on the film. Lung volumes are low. The cardio pericardial silhouette is enlarged. Diffuse interstitial opacity is associated with vascular congestion. Telemetry leads overlie the chest. No evidence for pneumothorax or substantial pleural effusion.  IMPRESSION: Low volume film with enlargement of the cardiopericardial silhouette. Vascular congestion with interstitial opacities suggest associated edema.   Electronically Signed   By: Kennith CenterEric  Mansell M.D.   On: 09/20/2013 09:30     ZOX:[id number]:Right sided airspace  disease  ASSESSMENT / PLAN:  PULMONARY A: acute respiratory failure s/p arrest.  R>L pulm infiltrates aspiration vs asymmetric edema  P:   Full vent support F/u abg/pcxr Sedation protocol  See ID section Volume removal via HD   CARDIOVASCULAR A:  PEA arrest (20 minutes to ROSC) H/o symptomatic bradycardia ? Not sure if this was precipitating event  H/o right IJ thrombus  HTN (SBP 230s) P:  Nicardipine gtt goal SBP ~180 for first 24 hrs Cards consulted, echo pending Tele monitoring  Cycle enzymes   RENAL A:   ESRD, was only 20 minutes into HD P:   Nephrology consult   GASTROINTESTINAL A:   H/o UGIB  P:   PPI  NPO   HEMATOLOGIC A:  Anemia of chronic disease      Uremia       Right IJ thrombus  No evidence of bleed currently  P:  PPI  No role for heparin given recent life threatening GIB   INFECTIOUS A:  Possible aspiration PNA  P:   Empiric unasyn Culture and cycle PCTs   ENDOCRINE A:  DM type 2 P:   ssi  NEUROLOGIC A:  Acute encephalopathy. S/p cardiac arrest w/ 20 minutes until ROSC. Given h/o bleeding, PEA and ESRD will not place her on hypothermia. Worried about hypoxic event  P:   CT head r/o bleed w/ hypertension  Supportive care Avoid fever and ensure normal temp   TODAY'S SUMMARY: 20 minute PEA. PT is ESRD, prior bradycardia w/ consideration for PPM, recent  UGIB. Unclear of what was the cause of her event. Favor cardiac event.. Possibly bradycardia?   I have personally obtained a history, examined the patient, evaluated laboratory and imaging results, formulated the assessment and plan and placed orders. CRITICAL CARE: The patient is critically ill with multiple organ systems failure and requires high complexity decision making for assessment and support, frequent evaluation and titration of therapies, application of advanced monitoring technologies and extensive interpretation of multiple databases. Critical Care Time devoted to patient care services described in this note is 45 minutes.   Billy Fischer, MD ; [facility] [phone].  After 5:30 PM or weekends, call [phone]  09/20/2013, 10:36 AM

## 2013-09-20 NOTE — Consult Note (Addendum)
Renal Service Consult Note [facility]  Briana Gould 09/20/2013 Briana Gould D Requesting Physician:  Dr Sung Amabile  Reason for Consult: ESRD patient with recurrent cardiac arrest HPI: The patient is a 68 y.o. year-old with hx or ESRD, HTN, DM and CAD (diffuse distal disease on cath 2011) who was at dialysis today and had cardiac arrest.  According to HD staff pt presented feeling fine, preHD BP was 182/79.  She was 3.5kg over dry wt.  She was 12 minutes into dialysis when she reported feeling "bad", UF was turned off and she was rinsed back, shortly thereafter she "stopped breathing" and arrested. CPR was performed and AED placed, no shock was given. EMS arrived and pt had ROSC.  20 min CPR was noted in ED notes.  Pt is now unresponsive.   Patient was here in Dec with recurrent unresponsive episodes at OP dialysis. On her first inpatient dialysis she arrested 15 minutes into dialysis and was successfully resuscitated. Workup showed severe bilat intracranial carotid disease; neurology saw patient and noted high risk for future stroke, but not a candidate for invasive Rx due to the diffuse nature of the disease. Decision was made to try and keep BP high on dialysis, keep SBP over 140.    Per conversation with HD staff, pt typically presents with BP 's on higher side, if BP drops below 140 they turn off UF and continue dialysis and she has not had any problems prior to today.   She was admitted again in Dec 2014 with GIB ROS  not available  Past Medical History  Past Medical History  Diagnosis Date  . End stage renal disease on dialysis   . Peripheral arterial occlusive disease   . Hypertension   . Cardiac arrest   . Diabetes mellitus type 2, uncontrolled, with complications   . DVT (deep venous thrombosis)      Right internal jugular vein  . CAD (coronary artery disease), native coronary artery 2011    Diffuse distal vessel CAD noted at cardiac catheterization   Past  Surgical History  Past Surgical History  Procedure Laterality Date  . Below knee leg amputation Left   . Av fistula placement Left    Family History No family history on file. Social History  has no tobacco, alcohol, and drug history on file. Allergies No Known Allergies Home medications Prior to Admission medications   Medication Sig Start Date End Date Taking? Authorizing Provider  amLODipine (NORVASC) 5 MG tablet Take 5 mg by mouth daily.   Yes Historical Provider, MD  b complex-vitamin c-folic acid (NEPHRO-VITE) 0.8 MG TABS tablet Take 1 tablet by mouth daily.   Yes Historical Provider, MD  doxazosin (CARDURA) 8 MG tablet Take 8 mg by mouth daily.   Yes Historical Provider, MD  famotidine (PEPCID) 40 MG tablet Take 40 mg by mouth daily.   Yes Historical Provider, MD  hydrALAZINE (APRESOLINE) 50 MG tablet Take 50 mg by mouth 2 (two) times daily.   Yes Historical Provider, MD  lisinopril (PRINIVIL,ZESTRIL) 40 MG tablet Take 40 mg by mouth daily.   Yes Historical Provider, MD  Nutritional Supplements (FEEDING SUPPLEMENT, NEPRO CARB STEADY,) LIQD Take 237 mLs by mouth daily.   Yes Historical Provider, MD  sevelamer carbonate (RENVELA) 800 MG tablet Take 1,600 mg by mouth 3 (three) times daily with meals.    Yes Historical Provider, MD  traMADol (ULTRAM) 50 MG tablet Take 50 mg by mouth every 12 (twelve) hours as needed (for pain).  Yes Historical Provider, MD   Liver Function Tests  Recent Labs Lab 09/20/13 0913 09/20/13 1155  AST 129* 121*  ALT 101* 96*  ALKPHOS 105 101  BILITOT 0.4 0.6  PROT 7.0 6.7  ALBUMIN 3.4* 3.2*    Recent Labs Lab 09/20/13 1155  AMYLASE 132*   CBC  Recent Labs Lab 09/20/13 0913 09/20/13 0914  WBC 10.1  --   NEUTROABS 8.2*  --   HGB 11.2* 12.6  HCT 33.5* 37.0  MCV 96.3  --   PLT 92*  --    Basic Metabolic Panel  Recent Labs Lab 09/20/13 0913 09/20/13 0914 09/20/13 1155  NA 140 138 140  K 4.5 4.3 4.4  CL 94* 100 98  CO2 23  --   21  GLUCOSE 153* 159* 240*  BUN 38* 44* 41*  CREATININE 7.22* 7.60* 6.92*  CALCIUM 9.4  --  9.0  PHOS  --   --  5.0*    Exam  Blood pressure 152/59, pulse 85, temperature 94.6 F (34.8 C), temperature source Rectal, resp. rate 20, height 5\' 2"  (1.575 m), weight 83.5 kg (184 lb 1.4 oz), SpO2 99.00%. On vent, unresponsive, sedation just turned off Pupils minimally reactive ETT in place No neck bruits Chest is clear bilat RRR no MRG Abd is soft, mod obese, nondistended No LE or UE edema, L BKA w healed scars LLE No rash, cyanosis LUA AVF patent  Dialysis: MWF at [place] 4.5h   79kg   BFR 250 (just started buttonhole to AVF)   Profile 2   F180   No heparin Hectorol 8  Epo 20K     Venofer none  Assessment/Plan: 1. Recurrent cardiorespiratory arrest: known severe bilat intracranial vascular disease.  I have asked neurology to see her again. Concerned this is from cerebral hypoperfusion causing resp arrest.  2. ESRD: usual HD MWF, hold HD for now 3. Anemia / CKD: max EPO, hold for now (Hb >12) 4. MBD / CKD: resume binder when taking po, get vit D dose 5. HTN/volume: was on 4 bp meds at home 6. PAD hx L BKA 7. CAD, hx diffuse "distal" disease on cath 2011  Will follow.    Vinson Moselleob Jenney Brester MD pager [phone]    cell [phone] 09/20/2013, 2:48 PM

## 2013-09-21 ENCOUNTER — Inpatient Hospital Stay (HOSPITAL_COMMUNITY): Payer: Medicare Other

## 2013-09-21 DIAGNOSIS — G9340 Encephalopathy, unspecified: Secondary | ICD-10-CM

## 2013-09-21 DIAGNOSIS — J9600 Acute respiratory failure, unspecified whether with hypoxia or hypercapnia: Secondary | ICD-10-CM

## 2013-09-21 LAB — BASIC METABOLIC PANEL
BUN: 48 mg/dL — AB (ref 6–23)
CO2: 25 meq/L (ref 19–32)
Calcium: 8.9 mg/dL (ref 8.4–10.5)
Chloride: 100 mEq/L (ref 96–112)
Creatinine, Ser: 8.38 mg/dL — ABNORMAL HIGH (ref 0.50–1.10)
GFR calc Af Amer: 5 mL/min — ABNORMAL LOW (ref >90)
GFR calc non Af Amer: 4 mL/min — ABNORMAL LOW (ref >90)
GLUCOSE: 91 mg/dL (ref 70–99)
POTASSIUM: 4.8 meq/L (ref 3.7–5.3)
SODIUM: 142 meq/L (ref 137–147)

## 2013-09-21 LAB — GLUCOSE, CAPILLARY
GLUCOSE-CAPILLARY: 83 mg/dL (ref 70–99)
Glucose-Capillary: 103 mg/dL — ABNORMAL HIGH (ref 70–99)
Glucose-Capillary: 56 mg/dL — ABNORMAL LOW (ref 70–99)
Glucose-Capillary: 81 mg/dL (ref 70–99)
Glucose-Capillary: 92 mg/dL (ref 70–99)
Glucose-Capillary: 89 mg/dL (ref 70–99)

## 2013-09-21 LAB — POCT I-STAT 3, ART BLOOD GAS (G3+)
Acid-Base Excess: 2 mmol/L (ref 0.0–2.0)
Bicarbonate: 26.7 mEq/L — ABNORMAL HIGH (ref 20.0–24.0)
O2 Saturation: 99 %
PCO2 ART: 43.6 mmHg (ref 35.0–45.0)
PH ART: 7.398 (ref 7.350–7.450)
PO2 ART: 159 mmHg — AB (ref 80.0–100.0)
TCO2: 28 mmol/L (ref 0–100)

## 2013-09-21 LAB — CBC
HCT: 29.5 % — ABNORMAL LOW (ref 36.0–46.0)
HEMOGLOBIN: 9.5 g/dL — AB (ref 12.0–15.0)
MCH: 31.3 pg (ref 26.0–34.0)
MCHC: 32.2 g/dL (ref 30.0–36.0)
MCV: 97 fL (ref 78.0–100.0)
Platelets: 107 10*3/uL — ABNORMAL LOW (ref 150–400)
RBC: 3.04 MIL/uL — AB (ref 3.87–5.11)
RDW: 18.5 % — ABNORMAL HIGH (ref 11.5–15.5)
WBC: 8.8 10*3/uL (ref 4.0–10.5)

## 2013-09-21 LAB — TROPONIN I
Troponin I: 1.49 ng/mL (ref <0.30)
Troponin I: 1.65 ng/mL (ref <0.30)

## 2013-09-21 MED ORDER — DEXTROSE-NACL 5-0.45 % IV SOLN
INTRAVENOUS | Status: DC
  Administered 2013-09-21: 1000 mL via INTRAVENOUS
  Administered 2013-09-22 – 2013-09-23 (×3): via INTRAVENOUS

## 2013-09-21 NOTE — Progress Notes (Signed)
Name: Briana Gould MRN: [id number] DOB: Dec 29, 1945    ADMISSION DATE:  09/20/2013 CONSULTATION DATE:  1/12  PRIMARY SERVICE: PCCM   CHIEF COMPLAINT:  Cardiac arrest  BRIEF PATIENT DESCRIPTION:  68 year old female w/ ESRD, recently d/c from [facility] on 12/21 after being treated for UGIB while on coumadin for Right IJ thrombus stroke prevention. Was in usual state of health, getting stronger after her d/c. On 1/12 was at regular scheduled HD. About 20 minutes into HD she slumped over and became unresponsive. Initial rhythm was reported as PEA. CPR started. Reported 20 minutes CPR before ROSC, no drugs. Transferred to [facility] s/p arrest.   SIGNIFICANT EVENTS / STUDIES:  1/12 cardiac arrest/ PEA at HD. 20 minutes estimated to ROSC 1/12 Echocardiogram:  1/12 CT head: Age uncertain small infarct mid pons. Small infarct in this area may be recent. 1/13 Cognition much improved. Failed SBT  LINES / TUBES: ETT 1/12>>> Left IJ 1/12>>> Right radial 1/12>>>  CULTURES: Resp 1/12 >>    ANTIBIOTICS: unasyn 1/12 >> 1/13   SUBJECTIVE:  RASS +1. Reportedly F/C. Did not tolerate SBT  VITAL SIGNS: Temp:  [97.7 F (36.5 C)-101.1 F (38.4 C)] 99.6 F (37.6 C) (01/13 1600) Pulse Rate:  [50-81] 73 (01/13 1624) Resp:  [0-32] 22 (01/13 1624) BP: (100-150)/(32-60) 127/52 mmHg (01/13 1624) SpO2:  [98 %-100 %] 100 % (01/13 1624) Arterial Line BP: (76-163)/(32-104) 100/37 mmHg (01/13 1500) FiO2 (%):  [40 %-50 %] 40 % (01/13 1624) Weight:  [84.1 kg (185 lb 6.5 oz)] 84.1 kg (185 lb 6.5 oz) (01/13 0414) HEMODYNAMICS: CVP:  [14 mmHg-23 mmHg] 23 mmHg VENTILATOR SETTINGS: Vent Mode:  [-] PRVC FiO2 (%):  [40 %-50 %] 40 % Set Rate:  [18 bmp] 18 bmp Vt Set:  [400 mL] 400 mL PEEP:  [5 cmH20] 5 cmH20 Plateau Pressure:  [11 cmH20-18 cmH20] 18 cmH20 INTAKE / OUTPUT: Intake/Output     01/12 0701 - 01/13 0700 01/13 0701 - 01/14 0700   I.V. (mL/kg) 548 (6.5) 460 (5.5)   NG/GT  30   IV Piggyback 100 100   Total Intake(mL/kg) 648 (7.7) 590 (7)   Urine (mL/kg/hr) 40    Total Output 40     Net +608 +590        Stool Occurrence 1 x      PHYSICAL EXAMINATION: General: RASS +1, not F/C Neuro: No focal deficits HEENT: WNL Cardiovascular:  Rrr, III/VI SEM  Lungs:  Scattered rhonchi Abdomen:  Soft, non-tender no OM  Musculoskeletal:  Left AKA looks unremarkable  Skin:  Intact   LABS: I have reviewed all of today's lab results. Relevant abnormalities are discussed in the A/P section   CXR: Low volumes, vasc crowding, CM   ASSESSMENT / PLAN:  PULMONARY A: acute respiratory failure s/p arrest.  P:   Cont vent support Wean as tolerated in PSV Vent bundle Daily SBT  CARDIOVASCULAR A:  PEA arrest (20 minutes to ROSC) H/o recurrent cardiac arrest during HD H/O severe cerebrovascular disease R IJ thrombus  HTN, resolved P:  Cont monitoring PRN antihypertensives Needs MAP > 65 mmHg  RENAL A:   ESRD P:   Nephrology following   GASTROINTESTINAL A:   H/o UGIB P:   Cont PPI  Consider TFs 1/14  HEMATOLOGIC A:   Anemia of chronic disease  Thrombocytopenia, mild P:  DVT px: SCDs Monitor CBC intermittently  INFECTIOUS A:  No overt infection identified P:   Micro and abx as above  ENDOCRINE A:  DM type 2 P:   Cont SSI  NEUROLOGIC A:  Acute post anoxic encephalopathy, appears improved Severe cerebrovascular disease  P:   CT head r/o bleed w/ hypertension  Supportive care Avoid fever and ensure normal temp   TODAY'S SUMMARY:    I have personally obtained a history, examined the patient, evaluated laboratory and imaging results, formulated the assessment and plan and placed orders. CRITICAL CARE: The patient is critically ill with multiple organ systems failure and requires high complexity decision making for assessment and support, frequent evaluation and titration of therapies, application of advanced monitoring technologies and extensive interpretation  of multiple databases. Critical Care Time devoted to patient care services described in this note is 45 minutes.   Billy Fischer, MD ; [facility] [phone].  After 5:30 PM or weekends, call [phone]  09/21/2013, 4:52 PM

## 2013-09-21 NOTE — Progress Notes (Signed)
[name], female   DOB: 22-Sep-1945, 68 y.o.   MRN: [id number]    Subjective:  Sedated on vent  Discussed care with "sister"  Best friend   Objective:  Filed Vitals:   09/21/13 0500 09/21/13 0600 09/21/13 0700 09/21/13 0800  BP:    130/43  Pulse: 63 62 60 70  Temp: 99.5 F (37.5 C) 99.5 F (37.5 C) 99.7 F (37.6 C)   TempSrc:   Core (Comment)   Resp: 21 0 20 32  Height:      Weight:      SpO2: 100% 100% 100% 98%    Intake/Output from previous day:  Intake/Output Summary (Last 24 hours) at 09/21/13 0817 Last data filed at 09/21/13 0800  Gross per 24 hour  Intake    728 ml  Output     40 ml  Net    688 ml    Physical Exam: Affect appropriate Chronically ill black female  HEENT: intubated LIJ catheter  Neck supple with no adenopathy JVP normal no bruits no thyromegaly Lungs rhonchi no wheezing and good diaphragmatic motion Heart:  S1/S2 SEM  murmur, no rub, gallop or click PMI normal Abdomen: benighn, BS positve, no tenderness, no AAA no bruit.  No HSM or HJR Distal pulses intact with no bruits No edema Neuro non-focal Skin warm and dry Fistula LUE    Lab Results: Basic Metabolic Panel:  Recent Labs  40/98/11 0914 09/20/13 1155 09/21/13 0620  NA 138 140 142  K 4.3 4.4 4.8  CL 100 98 100  CO2  --  21 25  GLUCOSE 159* 240* 91  BUN 44* 41* 48*  CREATININE 7.60* 6.92* 8.38*  CALCIUM  --  9.0 8.9  MG  --  2.4  --   PHOS  --  5.0*  --    Liver Function Tests:  Recent Labs  09/20/13 0913 09/20/13 1155  AST 129* 121*  ALT 101* 96*  ALKPHOS 105 101  BILITOT 0.4 0.6  PROT 7.0 6.7  ALBUMIN 3.4* 3.2*    Recent Labs  09/20/13 1155  AMYLASE 132*   CBC:  Recent Labs  09/20/13 0913 09/20/13 0914 09/21/13 0620  WBC 10.1  --  8.8  NEUTROABS 8.2*  --   --   HGB 11.2* 12.6 9.5*  HCT 33.5* 37.0 29.5*  MCV 96.3  --  97.0  PLT 92*  --  107*   Cardiac Enzymes:  Recent Labs  09/20/13 1707 09/21/13 09/21/13 0621  TROPONINI  0.85* 1.49* 1.65*    Imaging: Ct Head Wo Contrast  09/20/2013   CLINICAL DATA:  Altered mental status ; status post cardiopulmonary resuscitation  EXAM: CT HEAD WITHOUT CONTRAST  TECHNIQUE: Contiguous axial images were obtained from the base of the skull through the vertex without intravenous contrast.  COMPARISON:  None.  FINDINGS: There is age related volume loss. There is no demonstrable mass, hemorrhage, extra-axial fluid collection, or midline shift. There is mild small vessel disease in the centra semiovale bilaterally. There is decreased attenuation in the midline of the upper to mid pons which may represent a small recent infarct in this area. No other acute appearing infarct is appreciable.  Bony calvarium appears intact.  The mastoid air cells are clear.  IMPRESSION: Age uncertain small infarct mid pons. Small infarct in this area may be recent.  There is underlying age related volume loss with mild periventricular small vessel disease. There is no intracranial mass or hemorrhage. No gray-white compartment edema  is appreciable on this study.   Electronically Signed   By: Bretta BangWilliam  Woodruff M.D.   On: 09/20/2013 12:54   Dg Chest Port 1 View  09/20/2013   CLINICAL DATA:  Post IJ placement  EXAM: PORTABLE CHEST - 1 VIEW  COMPARISON:  Prior chest x-ray 09/20/2013 at 9:19 a.m.  FINDINGS: Interval placement of a left IJ approach central venous catheter. The tip of the catheter is in good position at the superior cavoatrial junction. No evidence of complicating pneumothorax. The patient remains intubated. The tip of the endotracheal tube is 4 cm above the carina. A nasogastric tube is present, the tip lies off the field of view presumably within the stomach. External defibrillator pads and metallic artifacts overlie the chest. Stable cardiomegaly, very low inspiratory volumes and mild pulmonary edema.  IMPRESSION: 1. New left IJ approach central venous catheter with the tip at the superior cavoatrial  junction. No evidence of pneumothorax or other complication. 2. Otherwise, no significant interval change in the appearance of the chest.   Electronically Signed   By: Malachy MoanHeath  McCullough M.D.   On: 09/20/2013 11:48   Dg Chest Portable 1 View  09/20/2013   CLINICAL DATA:  Status post intubation.  EXAM: PORTABLE CHEST - 1 VIEW  COMPARISON:  None.  FINDINGS: 0919 hrs. Endotracheal tube tip is 3.8 cm above the base of the carina. The NG tube passes into the stomach although the distal tip position is not included on the film. Lung volumes are low. The cardio pericardial silhouette is enlarged. Diffuse interstitial opacity is associated with vascular congestion. Telemetry leads overlie the chest. No evidence for pneumothorax or substantial pleural effusion.  IMPRESSION: Low volume film with enlargement of the cardiopericardial silhouette. Vascular congestion with interstitial opacities suggest associated edema.   Electronically Signed   By: Kennith CenterEric  Mansell M.D.   On: 09/20/2013 09:30    Cardiac Studies:  ECG:  SR LBBB pattern read by computer as accel junctional but P waves present   Telemetry:  NSR    Echo: EF 50-55% likely mild AS elevated PA pressure by TR velocity   Medications:   . ampicillin-sulbactam (UNASYN) IV  3 g Intravenous Q12H  . antiseptic oral rinse  15 mL Mouth Rinse QID  . chlorhexidine  15 mL Mouth Rinse BID  . insulin aspart  2-6 Units Subcutaneous Q4H  . pantoprazole (PROTONIX) IV  40 mg Intravenous QHS     . dextrose 5 % and 0.45% NaCl 1,000 mL (09/21/13 0035)  . fentaNYL infusion INTRAVENOUS 200 mcg/hr (09/21/13 0403)  . niCARDipine      Assessment/Plan:  PEA:  Recurrent episodes.  No heart block By definition bradycardia is not the reason for her events as she always had A pulse  Would be unlikely for these episodes to be cardiac especially if she received no meds during CPR but recovered Fever on antibiotics  Continue vent support  Significant intracerebral carotid  disease  EEG pending  Mild elevation in troponin Not likely significant in setting of CRF and CPR.  No further cardiac recommendations at this time   Charlton Hawseter Nishan 09/21/2013, 8:17 AM

## 2013-09-21 NOTE — Progress Notes (Signed)
Hypoglycemic Event  CBG:56  Treatment: D50 IV 25 mL  Symptoms: None  Follow-up CBG: Time:2350 CBG Result:106 Possible Reasons for Event: Inadequate meal intake  Comments/MD notified:Dr. Byrum notified, orders received.    Briana Gould, Briana Gould  Remember to initiate Hypoglycemia Order Set & complete

## 2013-09-21 NOTE — Progress Notes (Signed)
  [facility] Progress Note   Subjective: waking up some, opens eyes and occ follows simple command  Filed Vitals:   09/21/13 0900 09/21/13 1000 09/21/13 1100 09/21/13 1129  BP:    114/37  Pulse: 64 79 68 64  Temp:      TempSrc:      Resp: 26 18 18 21   Height:      Weight:      SpO2: 100% 100% 100% 100%  Exam On vent, opens eyes to voice ETT in place No jvd Chest clear bilat RRR no MRG Abd soft, nt/nd No leg or UE edema, L BKA well healed LUA AVF patent Neuro moving extremities, not following most commands  Dialysis: MWF at [facility]  4.5h 79kg BFR 250 (just started buttonhole to AVF) Profile 2 F180 No heparin  Hectorol ? Epo 20K Venofer ?   Assessment/Plan:  1. Recurrent cardiorespiratory arrest: unclear cause, but episodes associated with dialysis.  +hx known severe bilat intracranial carotid stenosis which is not operable (w interrupted [place]), known hx severe pulm HTN, HTN and PVD as well as diffuse distal corornary disease. Suspect compromised CNS blood flow during dialysis due to a combination of these factors. Plan HD today or tomorrow with no UF.  Discussed with family, limited options, poor prognosis as none of these factors are treatable.    2. ESRD: usual HD MWF, hold HD for now 3. Anemia / CKD: max EPO, hold for now (Hb >12) 4. MBD / CKD: resume binder when taking po, get vit D dose 5. HTN/volume: was on 4 bp meds at home 6. PAD hx L BKA 7. CAD, hx diffuse "distal" disease on cath 2011   Vinson Moselleob Sartaj Hoskin MD pager [phone]    cell [phone] 09/21/2013, 11:35 AM   Recent Labs Lab 09/20/13 0913 09/20/13 0914 09/20/13 1155 09/21/13 0620  NA 140 138 140 142  K 4.5 4.3 4.4 4.8  CL 94* 100 98 100  CO2 23  --  21 25  GLUCOSE 153* 159* 240* 91  BUN 38* 44* 41* 48*  CREATININE 7.22* 7.60* 6.92* 8.38*  CALCIUM 9.4  --  9.0 8.9  PHOS  --   --  5.0*  --     Recent Labs Lab 09/20/13 0913 09/20/13 1155  AST 129* 121*  ALT 101*  96*  ALKPHOS 105 101  BILITOT 0.4 0.6  PROT 7.0 6.7  ALBUMIN 3.4* 3.2*    Recent Labs Lab 09/20/13 0913 09/20/13 0914 09/21/13 0620  WBC 10.1  --  8.8  NEUTROABS 8.2*  --   --   HGB 11.2* 12.6 9.5*  HCT 33.5* 37.0 29.5*  MCV 96.3  --  97.0  PLT 92*  --  107*   . antiseptic oral rinse  15 mL Mouth Rinse QID  . chlorhexidine  15 mL Mouth Rinse BID  . pantoprazole (PROTONIX) IV  40 mg Intravenous QHS   . dextrose 5 % and 0.45% NaCl 1,000 mL (09/21/13 0035)  . fentaNYL infusion INTRAVENOUS 200 mcg/hr (09/21/13 0403)   hydrALAZINE

## 2013-09-21 NOTE — Procedures (Signed)
ELECTROENCEPHALOGRAM REPORT  Patient: Briana Gould       Room #: 1O102H09 EEG No. ID: [id number] Age: 68 y.o.        Sex: female Referring Physician: Simmonds Report Date:  09/21/2013        Interpreting Physician: Aline BrochureSTEWART,Sherrilyn Nairn R  History: Briana Gould is an 68 y.o. female with end-stage renal disease on hemodialysis, hypertension, diabetes mellitus and coronary artery disease, who suffered a cardiac arrest on 09/20/2013 requiring 20 minutes of resuscitation. She was intubated and on mechanical ventilation.  Indications for study:  Assess severity of encephalopathy; rule out seizure activity  Technique: This is an 18 channel routine scalp EEG performed at the bedside with bipolar and monopolar montages arranged in accordance to the international 10/20 system of electrode placement.   Description: This EEG was performed at patient's bedside in the cardiac intensive.. Background activity consisted of diffuse low to moderate amplitude 1-2 Hz delta activity with superimposed 5-7 Hz irregular theta activity. Photic stimulation was not performed. Hyperventilation was not performed. No epileptiform discharges were recorded.  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 No evidence of epileptic activity was seen.   Venetia MaxonR Lorimer Tiberio M.D. [facility] [phone]

## 2013-09-21 NOTE — Progress Notes (Signed)
eLink Physician-Brief Progress Note Patient Name: Ameyah Dalal DOB: Jun 03, 1946 MRN: [id number]  Date of Service  09/21/2013   HPI/Events of Note   Hypoglycemia   eICU Interventions  Changed IVF to d5 0.45NS @30    Intervention Category Intermediate Interventions: Other:  BYRUM,ROBERT S. 09/21/2013, 12:11 AM

## 2013-09-21 NOTE — Consult Note (Signed)
Subjective: Patient is responsive to external stimuli and appears to wake up and respond to those around her. She still intubated. No overnight adverse clinical events reported. Patient has been agitated requiring mittens.  Objective: Current vital signs: BP 130/43  Pulse 79  Temp(Src) 99.7 F (37.6 C) (Core (Comment))  Resp 18  Ht 5\' 2"  (1.575 m)  Wt 84.1 kg (185 lb 6.5 oz)  BMI 33.90 kg/m2  SpO2 100%  Neurologic Exam: Somnolent but arousable with eye opening and visual focusing as well as tracking. Ability to follow commands was variable. She still intubated but has spontaneous respirations. Extraocular movements were full and conjugate on right and left lateral gaze. No facial weakness noted. Moves extremities equally with no signs of focal weakness. Deep tendon reflexes were normal and symmetrical.  EEG on 09/20/2013 showed moderate generalized nonspecific continuous slowing of cerebral activity. No seizure activity was seen.  Medications: I have reviewed the patient's current medications.  Assessment/Plan: Status post PEA arrest on 09/20/2013, now responsive with regaining of consciousness. She has no focal deficits at this point. She is still confused and had difficulty with following simple commands. She is also receiving fentanyl.  Prognosis at this point is good for recovery to functional state prior to cardiac arrest. Significant cognitive loss is unlikely.  Recommend no changes in current management. We will continue to follow this patient with you.  C.R. Roseanne RenoStewart, MD [facility] [phone]  09/21/2013  10:32 AM

## 2013-09-22 ENCOUNTER — Inpatient Hospital Stay (HOSPITAL_COMMUNITY): Payer: Medicare Other

## 2013-09-22 LAB — COMPREHENSIVE METABOLIC PANEL
ALBUMIN: 2.7 g/dL — AB (ref 3.5–5.2)
ALK PHOS: 97 U/L (ref 39–117)
ALT: 51 U/L — AB (ref 0–35)
AST: 34 U/L (ref 0–37)
BILIRUBIN TOTAL: 0.4 mg/dL (ref 0.3–1.2)
BUN: 55 mg/dL — AB (ref 6–23)
CHLORIDE: 97 meq/L (ref 96–112)
CO2: 22 mEq/L (ref 19–32)
Calcium: 9 mg/dL (ref 8.4–10.5)
Creatinine, Ser: 9.66 mg/dL — ABNORMAL HIGH (ref 0.50–1.10)
GFR calc Af Amer: 4 mL/min — ABNORMAL LOW (ref >90)
GFR calc non Af Amer: 4 mL/min — ABNORMAL LOW (ref >90)
Glucose, Bld: 104 mg/dL — ABNORMAL HIGH (ref 70–99)
POTASSIUM: 4.8 meq/L (ref 3.7–5.3)
SODIUM: 139 meq/L (ref 137–147)
Total Protein: 6 g/dL (ref 6.0–8.3)

## 2013-09-22 LAB — GLUCOSE, CAPILLARY
GLUCOSE-CAPILLARY: 125 mg/dL — AB (ref 70–99)
GLUCOSE-CAPILLARY: 159 mg/dL — AB (ref 70–99)
GLUCOSE-CAPILLARY: 99 mg/dL (ref 70–99)
Glucose-Capillary: 104 mg/dL — ABNORMAL HIGH (ref 70–99)
Glucose-Capillary: 108 mg/dL — ABNORMAL HIGH (ref 70–99)
Glucose-Capillary: 113 mg/dL — ABNORMAL HIGH (ref 70–99)
Glucose-Capillary: 90 mg/dL (ref 70–99)

## 2013-09-22 LAB — CBC
HEMATOCRIT: 27.4 % — AB (ref 36.0–46.0)
Hemoglobin: 9 g/dL — ABNORMAL LOW (ref 12.0–15.0)
MCH: 31.8 pg (ref 26.0–34.0)
MCHC: 32.8 g/dL (ref 30.0–36.0)
MCV: 96.8 fL (ref 78.0–100.0)
Platelets: 103 10*3/uL — ABNORMAL LOW (ref 150–400)
RBC: 2.83 MIL/uL — ABNORMAL LOW (ref 3.87–5.11)
RDW: 18.2 % — AB (ref 11.5–15.5)
WBC: 8.7 10*3/uL (ref 4.0–10.5)

## 2013-09-22 LAB — IRON AND TIBC
Iron: 31 ug/dL — ABNORMAL LOW (ref 42–135)
SATURATION RATIOS: 17 % — AB (ref 20–55)
TIBC: 180 ug/dL — ABNORMAL LOW (ref 250–470)
UIBC: 149 ug/dL (ref 125–400)

## 2013-09-22 LAB — FERRITIN: Ferritin: 1240 ng/mL — ABNORMAL HIGH (ref 10–291)

## 2013-09-22 MED ORDER — SODIUM CHLORIDE 0.9 % IV SOLN: INTRAVENOUS | Status: DC | PRN

## 2013-09-22 MED ORDER — NEPRO/CARBSTEADY PO LIQD
1000.0000 mL | ORAL | Status: DC
  Filled 2013-09-22: qty 1000

## 2013-09-22 MED ORDER — NEPRO/CARBSTEADY PO LIQD
1000.0000 mL | ORAL | Status: DC
  Filled 2013-09-22 (×2): qty 1000

## 2013-09-22 MED ORDER — DOXERCALCIFEROL 4 MCG/2ML IV SOLN
8.0000 ug | INTRAVENOUS | Status: DC
  Administered 2013-09-22 – 2013-10-01 (×5): 8 ug via INTRAVENOUS
  Filled 2013-09-22 (×5): qty 4

## 2013-09-22 MED ORDER — DEXMEDETOMIDINE HCL IN NACL 200 MCG/50ML IV SOLN
0.4000 ug/kg/h | INTRAVENOUS | Status: DC
  Administered 2013-09-22: 0.6 ug/kg/h via INTRAVENOUS
  Administered 2013-09-22: 0.4 ug/kg/h via INTRAVENOUS
  Administered 2013-09-22 (×2): 0.6 ug/kg/h via INTRAVENOUS
  Administered 2013-09-23: 0.8 ug/kg/h via INTRAVENOUS
  Administered 2013-09-23: 0.7 ug/kg/h via INTRAVENOUS
  Filled 2013-09-22: qty 100
  Filled 2013-09-22 (×5): qty 50

## 2013-09-22 MED ORDER — DARBEPOETIN ALFA-POLYSORBATE 100 MCG/0.5ML IJ SOLN
100.0000 ug | INTRAMUSCULAR | Status: DC
  Administered 2013-09-22 – 2013-09-29 (×2): 100 ug via INTRAVENOUS
  Filled 2013-09-22 (×2): qty 0.5

## 2013-09-22 MED ORDER — DEXTROSE 5 % IV SOLN
1.0000 g | INTRAVENOUS | Status: DC
  Administered 2013-09-22 – 2013-09-23 (×2): 1 g via INTRAVENOUS
  Filled 2013-09-22 (×3): qty 10

## 2013-09-22 MED ORDER — FENTANYL CITRATE 0.05 MG/ML IJ SOLN
25.0000 ug | INTRAMUSCULAR | Status: DC | PRN
  Administered 2013-09-22 – 2013-09-23 (×4): 50 ug via INTRAVENOUS
  Filled 2013-09-22 (×5): qty 2

## 2013-09-22 MED ORDER — SODIUM CHLORIDE 0.9 % IV SOLN
25.0000 ug/h | INTRAVENOUS | Status: DC
  Filled 2013-09-22: qty 50

## 2013-09-22 NOTE — Progress Notes (Signed)
INITIAL NUTRITION ASSESSMENT  DOCUMENTATION CODES Per approved criteria  -Not Applicable   INTERVENTION: 1.  Enteral nutrition; if pt to remain intubated, and TF warranted, recommend initiation of Vital High Protein @ 20 mL/hr continuous.  Advance by 10 mL q 4 hrs to 50 mL/hr goal to provide 1200 kcal, 105g protein, 996 mL free water.  NUTRITION DIAGNOSIS: Inadequate oral intake related to inability to eat as evidenced by vent, NPO.   Monitor:  1.  Enteral nutrition; initiation with tolerance.  Pt to meet >/=90% estimated needs with nutrition support.  2.  Wt/wt change; monitor trends  Reason for Assessment: Vent  68 y.o. female  Admitting Dx: Cardiac arrest  ASSESSMENT: Pt admitted after cardiac arrest during HD session.  Patient is currently intubated on ventilator support.  MV: 11.3 L/min Temp (24hrs), Avg:99 F (37.2 C), Min:97.8 F (36.6 C), Max:99.6 F (37.6 C)  Propofol: none  Getting HD at time of visit. Pt awake to touch and voice, but does not make an effort to communicate.   Nutrition Focused Physical Exam: Subcutaneous Fat:  Orbital Region: WNL Upper Arm Region: WNL Thoracic and Lumbar Region: WNL  Muscle:  Temple Region: WNL Clavicle Bone Region: WNL Clavicle and Acromion Bone Region: WNL Scapular Bone Region: WNL Dorsal Hand: WNL Patellar Region: WNL Anterior Thigh Region: WNL Posterior Calf Region: WNL  Edema: none present  Height: Ht Readings from Last 1 Encounters:  09/20/13 5\' 2"  (1.575 m)    Weight: Wt Readings from Last 1 Encounters:  09/22/13 186 lb 4.6 oz (84.5 kg)    Ideal Body Weight: 48 kg, adjusted for BKA  % Ideal Body Weight: 176%  Wt Readings from Last 10 Encounters:  09/22/13 186 lb 4.6 oz (84.5 kg)    Usual Body Weight: unable to assess  % Usual Body Weight: unable to assess  BMI:  Body mass index is 34.06 kg/(m^2).  Estimated Nutritional Needs: Kcal: 1781 Permissive underfeeding:  1056-1200 Protein:  >/=96g Fluid: ~1.2 L/day or per MD  Skin: intact  Diet Order:  NPO  EDUCATION NEEDS: -Education not appropriate at this time   Intake/Output Summary (Last 24 hours) at 09/22/13 0950 Last data filed at 09/22/13 0800  Gross per 24 hour  Intake   1070 ml  Output      0 ml  Net   1070 ml    Last BM: 1/12  Labs:   Recent Labs Lab 09/20/13 0914 09/20/13 1155 09/21/13 0620 09/22/13 0500  NA 138 140 142 139  K 4.3 4.4 4.8 4.8  CL 100 98 100 97  CO2  --  21 25 22   BUN 44* 41* 48* 55*  CREATININE 7.60* 6.92* 8.38* 9.66*  CALCIUM  --  9.0 8.9 9.0  MG  --  2.4  --   --   PHOS  --  5.0*  --   --   GLUCOSE 159* 240* 91 104*    CBG (last 3)   Recent Labs  09/22/13 0036 09/22/13 0456 09/22/13 0829  GLUCAP 90 104* 108*    Scheduled Meds: . antiseptic oral rinse  15 mL Mouth Rinse QID  . chlorhexidine  15 mL Mouth Rinse BID  . pantoprazole (PROTONIX) IV  40 mg Intravenous QHS    Continuous Infusions: . dextrose 5 % and 0.45% NaCl 1,000 mL (09/21/13 0035)  . fentaNYL infusion INTRAVENOUS 200 mcg/hr (09/21/13 2017)    Past Medical History  Diagnosis Date  . End stage renal disease on dialysis   .  Peripheral arterial occlusive disease   . Hypertension   . Cardiac arrest   . Diabetes mellitus type 2, uncontrolled, with complications   . DVT (deep venous thrombosis)      Right internal jugular vein  . CAD (coronary artery disease), native coronary artery 2011    Diffuse distal vessel CAD noted at cardiac catheterization    Past Surgical History  Procedure Laterality Date  . Below knee leg amputation Left   . Av fistula placement Left     Loyce Dys, MS RD LDN Clinical Inpatient Dietitian Pager: [phone] Weekend/After hours pager: [phone]

## 2013-09-22 NOTE — Progress Notes (Signed)
Name: Briana Gould MRN: [id number] DOB: 1946/06/11    ADMISSION DATE:  09/20/2013 CONSULTATION DATE:  1/12  PRIMARY SERVICE: PCCM   CHIEF COMPLAINT:  Cardiac arrest  BRIEF PATIENT DESCRIPTION:  68 year old female w/ ESRD, recently d/c from [facility] on 12/21 after being treated for UGIB while on coumadin for Right IJ thrombus stroke prevention. Was in usual state of health, getting stronger after her d/c. On 1/12 was at regular scheduled HD. About 20 minutes into HD she slumped over and became unresponsive. Initial rhythm was reported as PEA. CPR started. Reported 20 minutes CPR before ROSC, no drugs. Transferred to [facility] s/p arrest.   SIGNIFICANT EVENTS / STUDIES:  1/12 cardiac arrest/ PEA at HD. 20 minutes estimated to ROSC 1/12 Echocardiogram: LVEF 50-55%. No RWMAs. Moderate LVH. Probably mild AS. Mild MR. RVSP est 69 mmHg 1/12 CT head: Age uncertain small infarct mid pons. Small infarct in this area may be recent. 1/13 Cognition much improved. Failed SBT  LINES / TUBES: ETT 1/12>>> Left IJ 1/12>>> Right radial 1/12>>>  CULTURES: Resp 1/13 >>    ANTIBIOTICS: unasyn 1/12 >> 1/13 Ceftriaxone 1/13 >>   SUBJECTIVE:  RASS 0-+1. Not F/C. Tolerates PS 15 cm H2O, Purulent ET secretions  VITAL SIGNS: Temp:  [97.8 F (36.6 C)-99.3 F (37.4 C)] 97.9 F (36.6 C) (01/14 1220) Pulse Rate:  [33-72] 62 (01/14 1600) Resp:  [0-28] 14 (01/14 1600) BP: (125-174)/(41-80) 170/48 mmHg (01/14 1600) SpO2:  [99 %-100 %] 100 % (01/14 1600) Arterial Line BP: (86-167)/(35-92) 167/47 mmHg (01/14 1600) FiO2 (%):  [40 %] 40 % (01/14 1627) Weight:  [82 kg (180 lb 12.4 oz)-84.5 kg (186 lb 4.6 oz)] 82 kg (180 lb 12.4 oz) (01/14 1220) HEMODYNAMICS: CVP:  [16 mmHg-18 mmHg] 18 mmHg VENTILATOR SETTINGS: Vent Mode:  [-] CPAP;PSV FiO2 (%):  [40 %] 40 % Set Rate:  [18 bmp] 18 bmp Vt Set:  [400 mL] 400 mL PEEP:  [5 cmH20] 5 cmH20 Pressure Support:  [12 cmH20-15 cmH20] 15 cmH20 Plateau Pressure:  [18  cmH20-22 cmH20] 22 cmH20 INTAKE / OUTPUT: Intake/Output     01/13 0701 - 01/14 0700 01/14 0701 - 01/15 0700   I.V. (mL/kg) 1220 (14.4) 395.6 (4.8)   NG/GT 30    IV Piggyback 100 50   Total Intake(mL/kg) 1350 (16) 445.6 (5.4)   Urine (mL/kg/hr)     Other  2000 (2.5)   Total Output 0 2000   Net +1350 -1554.4          PHYSICAL EXAMINATION: General: RASS +1, not F/C Neuro: No focal deficits HEENT: WNL Cardiovascular:  Rrr, III/VI SEM  Lungs:  Scattered rhonchi Abdomen:  Soft, non-tender no OM  Musculoskeletal:  Left AKA looks unremarkable  Skin:  Intact   LABS: I have reviewed all of today's lab results. Relevant abnormalities are discussed in the A/P section   CXR: NSC   ASSESSMENT / PLAN:  PULMONARY A: acute respiratory failure s/p arrest.  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  HTN, resolved P:  Cont monitoring PRN antihypertensives Needs MAP > 65 mmHg for cerebral perfusion  RENAL A:   ESRD P:   Nephrology following   GASTROINTESTINAL A:   H/o UGIB P:   Cont PPI  Begin TFs 1/14  HEMATOLOGIC A:  Anemia of chronic disease  Thrombocytopenia, mild P:  DVT px: SCDs Monitor CBC intermittently  INFECTIOUS A:  Purulent resp secretions P:   Micro and abx as above  ENDOCRINE A:  DM type 2 P:   Cont SSI  NEUROLOGIC A:  Acute post anoxic encephalopathy, appears improved Severe cerebrovascular disease  P:   Supportive care Initiate dexmedetomidine 1/14 Attempt to transition off fentanyl gtt to PRN fentanyl  TODAY'S SUMMARY:    I have personally obtained a history, examined the patient, evaluated laboratory and imaging results, formulated the assessment and plan and placed orders. CRITICAL CARE: The patient is critically ill with multiple organ  systems failure and requires high complexity decision making for assessment and support, frequent evaluation and titration of therapies, application of advanced monitoring technologies and extensive interpretation of multiple databases. Critical Care Time devoted to patient care services described in this note is 35 minutes.   Billy Fischeravid Simonds, MD ; [facility] Mobile [phone].  After 5:30 PM or weekends, call [phone]  09/22/2013, 4:36 PM

## 2013-09-22 NOTE — Progress Notes (Signed)
Witnessed wasting of Fentanyl gtt of 100cc per Smith RobertErin Moore RN P.Bronson Bressman RN

## 2013-09-22 NOTE — Progress Notes (Signed)
Subjective: Patient awakens.  Seems to focus on examiner and follow but does not follow commands.    Objective: Current vital signs: BP 150/59  Pulse 68  Temp(Src) 98.7 F (37.1 C) (Oral)  Resp 15  Ht 5\' 2"  (1.575 m)  Wt 84.5 kg (186 lb 4.6 oz)  BMI 34.06 kg/m2  SpO2 99% Vital signs in last 24 hours: Temp:  [97.8 F (36.6 C)-99.6 F (37.6 C)] 98.7 F (37.1 C) (01/14 0830) Pulse Rate:  [33-73] 68 (01/14 1000) Resp:  [0-28] 15 (01/14 1000) BP: (114-170)/(37-80) 150/59 mmHg (01/14 1000) SpO2:  [98 %-100 %] 99 % (01/14 1000) Arterial Line BP: (76-153)/(37-82) 86/82 mmHg (01/14 1000) FiO2 (%):  [40 %] 40 % (01/14 0743) Weight:  [84.5 kg (186 lb 4.6 oz)] 84.5 kg (186 lb 4.6 oz) (01/14 0740)  Intake/Output from previous day: 01/13 0701 - 01/14 0700 In: 1250 [I.V.:1120; NG/GT:30; IV Piggyback:100] Out: 0  Intake/Output this shift: Total I/O In: 50 [I.V.:50] Out: -  Nutritional status:    Neurologic Exam: Mental Status: Awakens when name called.  Does not follow commands.  No verbalizations noted.  Cranial Nerves: II: Blinks to confrontation bilaterally, pupils right 3 mm, left 3 mm,and reactive bilaterally III,IV,VI: doll's response present bilaterally. Tracks examiner around the room. V,VII: corneal reflex present bilaterally  VIII: grossly intact IX,X: gag reflex not tested, XI: trapezius strength unable to test bilaterally XII: tongue strength unable to test Motor: Moves all extremities against gravity spontaneously.   Sensory: Withdraws to noxious stimuli.   Lab Results: Basic Metabolic Panel:  Recent Labs Lab 09/20/13 0913 09/20/13 0914 09/20/13 1155 09/21/13 0620 09/22/13 0500  NA 140 138 140 142 139  K 4.5 4.3 4.4 4.8 4.8  CL 94* 100 98 100 97  CO2 23  --  21 25 22   GLUCOSE 153* 159* 240* 91 104*  BUN 38* 44* 41* 48* 55*  CREATININE 7.22* 7.60* 6.92* 8.38* 9.66*  CALCIUM 9.4  --  9.0 8.9 9.0  MG  --   --  2.4  --   --   PHOS  --   --  5.0*  --    --     Liver Function Tests:  Recent Labs Lab 09/20/13 0913 09/20/13 1155 09/22/13 0500  AST 129* 121* 34  ALT 101* 96* 51*  ALKPHOS 105 101 97  BILITOT 0.4 0.6 0.4  PROT 7.0 6.7 6.0  ALBUMIN 3.4* 3.2* 2.7*    Recent Labs Lab 09/20/13 1155  AMYLASE 132*   No results found for this basename: AMMONIA,  in the last 168 hours  CBC:  Recent Labs Lab 09/20/13 0913 09/20/13 0914 09/21/13 0620 09/22/13 0500  WBC 10.1  --  8.8 8.7  NEUTROABS 8.2*  --   --   --   HGB 11.2* 12.6 9.5* 9.0*  HCT 33.5* 37.0 29.5* 27.4*  MCV 96.3  --  97.0 96.8  PLT 92*  --  107* 103*    Cardiac Enzymes:  Recent Labs Lab 09/20/13 1155 09/20/13 1707 09/21/13 09/21/13 0621  TROPONINI 0.54* 0.85* 1.49* 1.65*    Lipid Panel: No results found for this basename: CHOL, TRIG, HDL, CHOLHDL, VLDL, LDLCALC,  in the last 168 hours  CBG:  Recent Labs Lab 09/21/13 1538 09/21/13 2014 09/22/13 0036 09/22/13 0456 09/22/13 0829  GLUCAP 81 89 90 104* 108*    Microbiology: Results for orders placed during the hospital encounter of 09/20/13  MRSA PCR SCREENING     Status: None  Collection Time    09/20/13  1:06 PM      Result Value Range Status   MRSA by PCR NEGATIVE  NEGATIVE Final   Comment:            The GeneXpert MRSA Assay (FDA     approved for NASAL specimens     only), is one component of a     comprehensive MRSA colonization     surveillance program. It is not     intended to diagnose MRSA     infection nor to guide or     monitor treatment for     MRSA infections.    Coagulation Studies:  Recent Labs  09/20/13 1155  LABPROT 14.8  INR 1.19    Imaging: Ct Head Wo Contrast  09/20/2013   CLINICAL DATA:  Altered mental status ; status post cardiopulmonary resuscitation  EXAM: CT HEAD WITHOUT CONTRAST  TECHNIQUE: Contiguous axial images were obtained from the base of the skull through the vertex without intravenous contrast.  COMPARISON:  None.  FINDINGS: There is age  related volume loss. There is no demonstrable mass, hemorrhage, extra-axial fluid collection, or midline shift. There is mild small vessel disease in the centra semiovale bilaterally. There is decreased attenuation in the midline of the upper to mid pons which may represent a small recent infarct in this area. No other acute appearing infarct is appreciable.  Bony calvarium appears intact.  The mastoid air cells are clear.  IMPRESSION: Age uncertain small infarct mid pons. Small infarct in this area may be recent.  There is underlying age related volume loss with mild periventricular small vessel disease. There is no intracranial mass or hemorrhage. No gray-white compartment edema is appreciable on this study.   Electronically Signed   By: Bretta BangWilliam  Woodruff M.D.   On: 09/20/2013 12:54   Dg Chest Port 1 View  09/22/2013   CLINICAL DATA:  Respiratory failure.  EXAM: PORTABLE CHEST - 1 VIEW  COMPARISON:  Chest radiograph September 21, 2013  FINDINGS: Endotracheal tube tip projects 4.2 cm above the carina. Left internal jugular central venous catheter with distal tip at the cavoatrial junction. Nasogastric tube past the gastroesophageal junction region code though, tip is not visualized. No pneumothorax.  Stable cardiomegaly, worsening interstitial edema, with minimal patchy airspace opacity in left lung base, unchanged. Suspected small left pleural effusion. Soft tissue planes and included osseous structures are unchanged.  IMPRESSION: No apparent change in life-support lines.  Stable cardiomegaly with slightly worsening interstitial edema. Retrocardiac airspace opacity may reflect atelectasis or confluent edema with suspected small left pleural effusion.   Electronically Signed   By: Awilda Metroourtnay  Bloomer   On: 09/22/2013 06:31   Dg Chest Port 1 View  09/21/2013   CLINICAL DATA:  Respiratory distress.  Intubated patient.  EXAM: PORTABLE CHEST - 1 VIEW  COMPARISON:  09/20/2013  FINDINGS: Endotracheal tube tip lies 3 cm  above the carina. Nasogastric tube passes below the diaphragm well into the stomach. Left internal jugular central venous line tip lies the caval atrial junction.  Mild hazy airspace opacity is noted in the medial right lung base. This may reflect atelectasis. Infiltrate is possible. Lungs are otherwise clear. No pleural effusion or pneumothorax  IMPRESSION: 1. Support apparatus is stable in well positioned. 2. Mild hazy opacity in the medial right lung base, likely atelectasis. This potentially could reflect a small area of infiltrate or even asymmetric edema. Lungs are otherwise clear. Findings are similar to the prior exam  allowing for differences in patient positioning and technique.   Electronically Signed   By: Amie Portland M.D.   On: 09/21/2013 12:27   Dg Chest Port 1 View  09/20/2013   CLINICAL DATA:  Post IJ placement  EXAM: PORTABLE CHEST - 1 VIEW  COMPARISON:  Prior chest x-ray 09/20/2013 at 9:19 a.m.  FINDINGS: Interval placement of a left IJ approach central venous catheter. The tip of the catheter is in good position at the superior cavoatrial junction. No evidence of complicating pneumothorax. The patient remains intubated. The tip of the endotracheal tube is 4 cm above the carina. A nasogastric tube is present, the tip lies off the field of view presumably within the stomach. External defibrillator pads and metallic artifacts overlie the chest. Stable cardiomegaly, very low inspiratory volumes and mild pulmonary edema.  IMPRESSION: 1. New left IJ approach central venous catheter with the tip at the superior cavoatrial junction. No evidence of pneumothorax or other complication. 2. Otherwise, no significant interval change in the appearance of the chest.   Electronically Signed   By: Malachy Moan M.D.   On: 09/20/2013 11:48    Medications:  I have reviewed the patient's current medications. Scheduled: . antiseptic oral rinse  15 mL Mouth Rinse QID  . chlorhexidine  15 mL Mouth Rinse  BID  . darbepoetin (ARANESP) injection - DIALYSIS  100 mcg Intravenous Q Wed-HD  . pantoprazole (PROTONIX) IV  40 mg Intravenous QHS    Assessment/Plan: Status post PEA arrest on 09/20/2013, now responsive with regaining of consciousness. She has no focal deficits at this point. She is still encephalopathic but has not received Fentanyl since about 8pm on yesterday.  Currently undergoing dialysis.    With patient improving but clearly not at baseline will continue to follow.  EEG significant for severe slowing but shows no evidence of epileptiform activity.    Will continue to follow with you.      LOS: 2 days   Thana Farr, MD [facility] [phone] 09/22/2013  10:43 AM

## 2013-09-22 NOTE — Progress Notes (Signed)
RT note PT taken off ween due to low resp rate and low vE will try again later today

## 2013-09-22 NOTE — Progress Notes (Addendum)
  [facility] Progress Note   Subjective: opens eyes to voice  Filed Vitals:   09/22/13 0800 09/22/13 0815 09/22/13 0830 09/22/13 0930  BP: 161/60 160/60 161/57 170/64  Pulse: 60 69    Temp:   98.7 F (37.1 C)   TempSrc:   Oral   Resp: 18 17 18    Height:      Weight:      SpO2:      Exam On vent, opens eyes to voice ETT in place No jvd Chest clear bilat RRR no MRG Abd soft, nt/nd No leg or UE edema, L BKA well healed LUA AVF patent Neuro moving extremities, not following commands, opens eyes to voice  Dialysis: MWF at [facility]  4.5h 79kg BFR 250 (just started buttonhole to AVF) Profile 2 F180 No heparin  Hectorol 8 Epo 20K Venofer none  Assessment/Plan:  1. 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  HD may not be suitable modality for patient long-term.  Cont HD for now in-house, if MS recovers will need to consider change to PD.  2. ESRD: HD today 3. Anemia / CKD: on max EPO at center, Hb 12 > 9 here, will resume esa w darbe at lower dose 100/wk, check Fe stores 4. MBD / CKD: resume binder when taking po, get vit D dose 5. HTN/volume: BP normal to high, was on 4 bp meds at home, none here. Up 5 kg by weight, gentle UF today 2kg. Continue prn IV BP meds for now. Keep BP high normal range.  6. PAD hx L BKA 7. CAD, hx diffuse "distal" disease on cath 2011   Vinson Moselleob Baylen Buckner MD pager [phone]    cell [phone] 09/22/2013, 9:59 AM   Recent Labs Lab 09/20/13 0914 09/20/13 1155 09/21/13 0620 09/22/13 0500  NA 138 140 142 139  K 4.3 4.4 4.8 4.8  CL 100 98 100 97  CO2  --  21 25 22   GLUCOSE 159* 240* 91 104*  BUN 44* 41* 48* 55*  CREATININE 7.60* 6.92* 8.38* 9.66*  CALCIUM  --  9.0 8.9 9.0  PHOS  --  5.0*  --   --     Recent Labs Lab 09/20/13 0913 09/20/13 1155 09/22/13 0500  AST 129* 121* 34  ALT 101* 96* 51*  ALKPHOS 105 101 97  BILITOT 0.4 0.6 0.4   PROT 7.0 6.7 6.0  ALBUMIN 3.4* 3.2* 2.7*    Recent Labs Lab 09/20/13 0913 09/20/13 0914 09/21/13 0620 09/22/13 0500  WBC 10.1  --  8.8 8.7  NEUTROABS 8.2*  --   --   --   HGB 11.2* 12.6 9.5* 9.0*  HCT 33.5* 37.0 29.5* 27.4*  MCV 96.3  --  97.0 96.8  PLT 92*  --  107* 103*   . antiseptic oral rinse  15 mL Mouth Rinse QID  . chlorhexidine  15 mL Mouth Rinse BID  . pantoprazole (PROTONIX) IV  40 mg Intravenous QHS   . dextrose 5 % and 0.45% NaCl 1,000 mL (09/21/13 0035)  . fentaNYL infusion INTRAVENOUS 200 mcg/hr (09/21/13 2017)   hydrALAZINE

## 2013-09-22 NOTE — Progress Notes (Signed)
[facility]  Patient Status: Active (receiving services up to time of hospitalization)  [facility] is providing the following services: RN and PT  If patient discharges after hours, please call [phone].   Kizzie FurnishDonna Fellmy 09/22/2013, 2:05 PM

## 2013-09-23 ENCOUNTER — Inpatient Hospital Stay (HOSPITAL_COMMUNITY): Payer: Medicare Other

## 2013-09-23 LAB — BASIC METABOLIC PANEL WITH GFR
BUN: 31 mg/dL — ABNORMAL HIGH (ref 6–23)
CO2: 20 meq/L (ref 19–32)
Calcium: 9.5 mg/dL (ref 8.4–10.5)
Chloride: 94 meq/L — ABNORMAL LOW (ref 96–112)
Creatinine, Ser: 5.81 mg/dL — ABNORMAL HIGH (ref 0.50–1.10)
GFR calc Af Amer: 8 mL/min — ABNORMAL LOW
GFR calc non Af Amer: 7 mL/min — ABNORMAL LOW
Glucose, Bld: 177 mg/dL — ABNORMAL HIGH (ref 70–99)
Potassium: 4.7 meq/L (ref 3.7–5.3)
Sodium: 139 meq/L (ref 137–147)

## 2013-09-23 LAB — CBC
HCT: 34 % — ABNORMAL LOW (ref 36.0–46.0)
Hemoglobin: 10.7 g/dL — ABNORMAL LOW (ref 12.0–15.0)
MCH: 30.9 pg (ref 26.0–34.0)
MCHC: 31.5 g/dL (ref 30.0–36.0)
MCV: 98.3 fL (ref 78.0–100.0)
Platelets: 117 10*3/uL — ABNORMAL LOW (ref 150–400)
RBC: 3.46 MIL/uL — ABNORMAL LOW (ref 3.87–5.11)
RDW: 17.5 % — ABNORMAL HIGH (ref 11.5–15.5)
WBC: 9.2 10*3/uL (ref 4.0–10.5)

## 2013-09-23 LAB — GLUCOSE, CAPILLARY
Glucose-Capillary: 179 mg/dL — ABNORMAL HIGH (ref 70–99)
Glucose-Capillary: 182 mg/dL — ABNORMAL HIGH (ref 70–99)
Glucose-Capillary: 183 mg/dL — ABNORMAL HIGH (ref 70–99)
Glucose-Capillary: 169 mg/dL — ABNORMAL HIGH (ref 70–99)

## 2013-09-23 MED ORDER — FENTANYL CITRATE 0.05 MG/ML IJ SOLN: 12.5000 ug | INTRAMUSCULAR | Status: DC | PRN

## 2013-09-23 MED ORDER — ENOXAPARIN SODIUM 30 MG/0.3ML ~~LOC~~ SOLN
30.0000 mg | SUBCUTANEOUS | Status: DC
  Administered 2013-09-23 – 2013-09-27 (×4): 30 mg via SUBCUTANEOUS
  Filled 2013-09-23 (×9): qty 0.3

## 2013-09-23 MED ORDER — ONDANSETRON HCL 4 MG/2ML IJ SOLN
4.0000 mg | Freq: Four times a day (QID) | INTRAMUSCULAR | Status: DC | PRN
  Administered 2013-09-23 – 2013-10-08 (×4): 4 mg via INTRAVENOUS
  Filled 2013-09-23 (×5): qty 2

## 2013-09-23 NOTE — Progress Notes (Signed)
Name: Briana Gould MRN: [id number] DOB: [birth year]    ADMISSION DATE:  09/20/2013 CONSULTATION DATE: 1/12   PRIMARY SERVICE: PCCM   CHIEF COMPLAINT: Cardiac arrest   BRIEF PATIENT DESCRIPTION:  68 year old female w/ ESRD, recently [facility] 12/21 after being treated for UGIB while on coumadin for Right IJ thrombus stroke prevention. Was in usual state of health, getting stronger after her d/c. On 1/12 was at regular scheduled HD. About 20 minutes into HD she slumped over and became unresponsive. Initial rhythm was reported as PEA. CPR started. Reported 20 minutes CPR before ROSC, no drugs. Transferred to [facility] s/p arrest.   SIGNIFICANT EVENTS / STUDIES:  1/12 cardiac arrest/ PEA at HD. 20 minutes estimated to ROSC  1/12 Echocardiogram: LVEF 50-55%. No RWMAs. Moderate LVH. Probably mild AS. Mild MR. RVSP est 69 mmHg  1/12 CT head: Age uncertain small infarct mid pons. Small infarct in this area may be recent.  1/13 Cognition much improved. Failed SBT  1/15 Extubated successfully  LINES / TUBES:  ETT 1/12>>> 1/15 Left IJ 1/12 >>  Right radial 1/12 >>    CULTURES:  Resp 1/13 >> canceled  ANTIBIOTICS:  Unasyn 1/12 >> 1/13  Ceftriaxone 1/13 >>   SUBJECTIVE: Awake, alert, follows commands, extubated and able to cough (minimally). NAD, no complaints.  VITAL SIGNS: Temp:  [97.5 F (36.4 C)-99.2 F (37.3 C)] 98.5 F (36.9 C) (01/15 0810) Pulse Rate:  [31-110] 62 (01/15 1035) Resp:  [0-29] 17 (01/15 1035) BP: (144-202)/(34-71) 156/45 mmHg (01/15 0937) SpO2:  [97 %-100 %] 100 % (01/15 1035) Arterial Line BP: (96-226)/(35-92) 131/72 mmHg (01/15 1035) FiO2 (%):  [40 %] 40 % (01/15 0851) Weight:  [82 kg (180 lb 12.4 oz)-83.7 kg (184 lb 8.4 oz)] 83.7 kg (184 lb 8.4 oz) (01/15 0343) HEMODYNAMICS: CVP:  [11 mmHg-17 mmHg] 15 mmHg VENTILATOR SETTINGS: Vent Mode:  [-] PSV;CPAP FiO2 (%):  [40 %] 40 % Set Rate:  [18 bmp] 18 bmp Vt Set:  [400 mL] 400 mL PEEP:  [5 cmH20] 5  cmH20 Pressure Support:  [5 cmH20-15 cmH20] 5 cmH20 Plateau Pressure:  [17 cmH20] 17 cmH20 INTAKE / OUTPUT: Intake/Output     01/14 0701 - 01/15 0700 01/15 0701 - 01/16 0700   I.V. (mL/kg) 1244.6 (14.9) 138.7 (1.7)   NG/GT     IV Piggyback 50    Total Intake(mL/kg) 1294.6 (15.5) 138.7 (1.7)   Other 2000    Total Output 2000     Net -705.4 +138.7          PHYSICAL EXAMINATION: General: Awake, alert, NAD Neuro: No focal deficits  HEENT: PERRL, EOMI, no icterus or pallor Cardiovascular: RRR, III/VI SEM  Lungs: Scattered rhonchi  Abdomen: BS +, Soft, non-tender, ND, no OM  Musculoskeletal: Left AKA looks unremarkable  Skin: Intact   LABS:  CBC  Recent Labs Lab 09/21/13 0620 09/22/13 0500 09/23/13 0409  WBC 8.8 8.7 9.2  HGB 9.5* 9.0* 10.7*  HCT 29.5* 27.4* 34.0*  PLT 107* 103* 117*   Coag's  Recent Labs Lab 09/20/13 1155  APTT 39*  INR 1.19   BMET  Recent Labs Lab 09/21/13 0620 09/22/13 0500 09/23/13 0409  NA 142 139 139  K 4.8 4.8 4.7  CL 100 97 94*  CO2 25 22 20   BUN 48* 55* 31*  CREATININE 8.38* 9.66* 5.81*  GLUCOSE 91 104* 177*   Electrolytes  Recent Labs Lab 09/20/13 1155 09/21/13 0620 09/22/13 0500 09/23/13 0409  CALCIUM 9.0 8.9 9.0 9.5  MG 2.4  --   --   --   PHOS 5.0*  --   --   --    Sepsis Markers  Recent Labs Lab 09/20/13 0918 09/20/13 1155  LATICACIDVEN 3.18*  --   PROCALCITON  --  0.52   ABG  Recent Labs Lab 09/20/13 0951 09/21/13 0353  PHART 7.367 7.398  PCO2ART 46.2* 43.6  PO2ART 281.0* 159.0*   Liver Enzymes  Recent Labs Lab 09/20/13 0913 09/20/13 1155 09/22/13 0500  AST 129* 121* 34  ALT 101* 96* 51*  ALKPHOS 105 101 97  BILITOT 0.4 0.6 0.4  ALBUMIN 3.4* 3.2* 2.7*   Cardiac Enzymes  Recent Labs Lab 09/20/13 0913  09/20/13 1155 09/20/13 1707 09/21/13 09/21/13 0621  TROPONINI  --   < > 0.54* 0.85* 1.49* 1.65*  PROBNP 57457.0*  --  47172.0*  --   --   --   < > = values in this interval not  displayed. Glucose  Recent Labs Lab 09/22/13 1206 09/22/13 1606 09/22/13 2005 09/22/13 2343 09/23/13 0339 09/23/13 0825  GLUCAP 99 113* 125* 159* 179* 182*   Imaging Dg Chest Port 1 View  09/23/2013   CLINICAL DATA:  Respiratory failure.  EXAM: PORTABLE CHEST - 1 VIEW  COMPARISON:  09/22/2013.  FINDINGS: Endotracheal tube, left IJ line, NG tube in stable position. Persistent cardiomegaly and pulmonary venous congestion present. Interstitial prominence present. These findings consistent with congestive heart failure. Left base atelectasis and/or infiltrate present. Left pleural effusion has improved. No pneumothorax.  IMPRESSION: 1. Stable line and tube positions. 2. Findings consistent with congestive heart failure with pulmonary interstitial edema again noted. Left pleural effusion has improved. 3. Left lower lobe atelectasis and/or infiltrate.   Electronically Signed   By: Maisie Fus  Register   On: 09/23/2013 07:36   Dg Chest Port 1 View  09/22/2013   CLINICAL DATA:  Respiratory failure.  EXAM: PORTABLE CHEST - 1 VIEW  COMPARISON:  Chest radiograph September 21, 2013  FINDINGS: Endotracheal tube tip projects 4.2 cm above the carina. Left internal jugular central venous catheter with distal tip at the cavoatrial junction. Nasogastric tube past the gastroesophageal junction region code though, tip is not visualized. No pneumothorax.  Stable cardiomegaly, worsening interstitial edema, with minimal patchy airspace opacity in left lung base, unchanged. Suspected small left pleural effusion. Soft tissue planes and included osseous structures are unchanged.  IMPRESSION: No apparent change in life-support lines.  Stable cardiomegaly with slightly worsening interstitial edema. Retrocardiac airspace opacity may reflect atelectasis or confluent edema with suspected small left pleural effusion.   Electronically Signed   By: Awilda Metro   On: 09/22/2013 06:31   Dg Chest Port 1 View  09/21/2013   CLINICAL  DATA:  Respiratory distress.  Intubated patient.  EXAM: PORTABLE CHEST - 1 VIEW  COMPARISON:  09/20/2013  FINDINGS: Endotracheal tube tip lies 3 cm above the carina. Nasogastric tube passes below the diaphragm well into the stomach. Left internal jugular central venous line tip lies the caval atrial junction.  Mild hazy airspace opacity is noted in the medial right lung base. This may reflect atelectasis. Infiltrate is possible. Lungs are otherwise clear. No pleural effusion or pneumothorax  IMPRESSION: 1. Support apparatus is stable in well positioned. 2. Mild hazy opacity in the medial right lung base, likely atelectasis. This potentially could reflect a small area of infiltrate or even asymmetric edema. Lungs are otherwise clear. Findings are similar to the prior exam  allowing for differences in patient positioning and technique.   Electronically Signed   By: Amie Portland M.D.   On: 09/21/2013 12:27   CXR: Stable line and tube positions. Findings consistent with congestive heart failure with pulmonary interstitial edema again noted. Left pleural effusion has improved. Left lower lobe atelectasis and/or infiltrates.  ASSESSMENT / PLAN:   PULMONARY  A:  acute respiratory failure s/p arrest Extubated, no immediate complications P:  Continue oxygen support, goal O2 > 90%  CARDIOVASCULAR  A:  PEA arrest (20 minutes to ROSC)  H/o recurrent cardiac arrest during HD  H/O severe cerebrovascular disease  R IJ thrombus  HTN, resolved  P:  Cont monitoring  PRN antihypertensives  Needs MAP > 65 mmHg for cerebral perfusion   RENAL  A:  ESRD, dialysis dependent P:  Nephrology following   GASTROINTESTINAL  A:  H/o UGIB  P:  Cont PPI  SLP for swallow eval CLD, advance as tolerated   HEMATOLOGIC  A:  Anemia of chronic disease, stable Thrombocytopenia, mild  P:  DVT px: SCDs  Monitor CBC intermittently   INFECTIOUS  A: Purulent resp secretions, culture canceled?? P:  Continue  rocephin   ENDOCRINE  A: DM type 2  P:  Cont SSI   NEUROLOGIC  A: Acute post anoxic encephalopathy, appears improved  Severe cerebrovascular disease  P:  Supportive care   TODAY'S SUMMARY: Extubated, much improved, anticipate transfer to SDU / tele hopeful for tomorrow  I have personally obtained a history, examined the patient, evaluated laboratory and imaging results, formulated the assessment and plan and placed orders. CRITICAL CARE: The patient is critically ill with multiple organ systems failure and requires high complexity decision making for assessment and support, frequent evaluation and titration of therapies, application of advanced monitoring technologies and extensive interpretation of multiple databases. Critical Care Time devoted to patient care services described in this note is 35 minutes.   Billy Fischer, MD ; [facility] [phone].  After 5:30 PM or weekends, call [phone]  Pulmonary and Critical Care Medicine [facility] Pager: [phone]  09/23/2013, 11:00 AM

## 2013-09-23 NOTE — Progress Notes (Signed)
eLink Physician-Brief Progress Note Patient Name: Briana Gould DOB: [birth year] MRN: [id number]  Date of Service  09/23/2013   HPI/Events of Note   nausea  eICU Interventions  zofran   Intervention Category Minor Interventions: Routine modifications to care plan (e.g. PRN medications for pain, fever)  Briana Gould,Briana Gould. 09/23/2013, 8:21 PM

## 2013-09-23 NOTE — Progress Notes (Signed)
[facility] Progress Note   Subjective: extubated, intermittently responsive  Filed Vitals:   09/23/13 0937 09/23/13 1000 09/23/13 1035 09/23/13 1100  BP: 156/45     Pulse: 95 31 62 61  Temp:      TempSrc:      Resp: 18 17 17 18   Height:      Weight:      SpO2: 98% 100% 100% 100%  Exam Off of vent, no distress, lying at 45deg No jvd Chest clear bilat RRR no MRG Abd soft, nt/nd Trace RLE edema, L BKA well healed LUA AVF patent Neuro pt not responding at this time  Dialysis: MWF at [place]  4.5h 79kg BFR 250 (just started buttonhole to AVF) Profile 2 F180 No heparin  Hectorol 8 Epo 20K Venofer none  Assessment/Plan:  1. 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 Returning to outpatient HD will be very high risk in this patient.  Cont HD for now in-house w increased dry wt, keeping SBP over 140.  If MS recovers will discuss possibility of PD. Have d/w daughter today 2. ESRD: HD tomorrow 3. Anemia / CKD: on max EPO at center, Hb 12 > 10 here. Resumed esa w darbe 100/wk. Tsat 17%, high ferritin. No IV fe for now.  4. MBD / CKD: resume binder when taking po, cont vit D 5. HTN/volume: BP normal to high, was on 4 bp meds at home, none here.  Allow permissive HTN due to #1, keep SBP over 140 during and between HD treatments.  6. PAD hx L BKA 7. CAD, hx diffuse "distal" disease on cath 2011   Vinson Moselleob Cheyne Bungert MD pager [phone]    cell [phone] 09/23/2013, 11:50 AM   Recent Labs Lab 09/20/13 0914 09/20/13 1155 09/21/13 0620 09/22/13 0500 09/23/13 0409  NA 138 140 142 139 139  K 4.3 4.4 4.8 4.8 4.7  CL 100 98 100 97 94*  CO2  --  21 25 22 20   GLUCOSE 159* 240* 91 104* 177*  BUN 44* 41* 48* 55* 31*  CREATININE 7.60* 6.92* 8.38* 9.66* 5.81*  CALCIUM  --  9.0 8.9 9.0 9.5  PHOS  --  5.0*  --   --   --     Recent Labs Lab 09/20/13 0913 09/20/13 1155 09/22/13 0500   AST 129* 121* 34  ALT 101* 96* 51*  ALKPHOS 105 101 97  BILITOT 0.4 0.6 0.4  PROT 7.0 6.7 6.0  ALBUMIN 3.4* 3.2* 2.7*    Recent Labs Lab 09/20/13 0913  09/21/13 0620 09/22/13 0500 09/23/13 0409  WBC 10.1  --  8.8 8.7 9.2  NEUTROABS 8.2*  --   --   --   --   HGB 11.2*  < > 9.5* 9.0* 10.7*  HCT 33.5*  < > 29.5* 27.4* 34.0*  MCV 96.3  --  97.0 96.8 98.3  PLT 92*  --  107* 103* 117*  < > = values in this interval not displayed. Marland Kitchen. antiseptic oral rinse  15 mL Mouth Rinse QID  . cefTRIAXone (ROCEPHIN)  IV  1 g Intravenous Q24H  . chlorhexidine  15 mL Mouth Rinse BID  . darbepoetin (ARANESP) injection - DIALYSIS  100 mcg Intravenous Q Wed-HD  . doxercalciferol  8 mcg Intravenous Q M,W,F-HD  . enoxaparin (LOVENOX) injection  30 mg Subcutaneous Q24H  . pantoprazole (PROTONIX) IV  40 mg Intravenous QHS   . sodium chloride  7 mL/hr at 09/22/13 1711  . dextrose 5 % and 0.45% NaCl 30 mL/hr at 09/22/13 1500   sodium chloride, fentaNYL, hydrALAZINE

## 2013-09-23 NOTE — Progress Notes (Signed)
[name], female   DOB: [birth year], 68 y.o.   MRN: [id number]    Subjective:  Extubated this am Lethargic   Objective:  Filed Vitals:   09/23/13 0937 09/23/13 1000 09/23/13 1035 09/23/13 1100  BP: 156/45     Pulse: 95 31 62 61  Temp:      TempSrc:      Resp: 18 17 17 18   Height:      Weight:      SpO2: 98% 100% 100% 100%    Intake/Output from previous day:  Intake/Output Summary (Last 24 hours) at 09/23/13 1213 Last data filed at 09/23/13 1100  Gross per 24 hour  Intake 1223.26 ml  Output   2000 ml  Net -776.74 ml    Physical Exam: Affect appropriate Chronically ill black female  HEENT: intubated LIJ catheter  Neck supple with no adenopathy JVP normal no bruits no thyromegaly Lungs rhonchi no wheezing and good diaphragmatic motion Heart:  S1/S2 SEM  murmur, no rub, gallop or click PMI normal Abdomen: benighn, BS positve, no tenderness, no AAA no bruit.  No HSM or HJR Left BKA  No edema Neuro non-focal Skin warm and dry Fistula LUE    Lab Results: Basic Metabolic Panel:  Recent Labs  21/30/8601/14/15 0500 09/23/13 0409  NA 139 139  K 4.8 4.7  CL 97 94*  CO2 22 20  GLUCOSE 104* 177*  BUN 55* 31*  CREATININE 9.66* 5.81*  CALCIUM 9.0 9.5   Liver Function Tests:  Recent Labs  09/22/13 0500  AST 34  ALT 51*  ALKPHOS 97  BILITOT 0.4  PROT 6.0  ALBUMIN 2.7*   No results found for this basename: LIPASE, AMYLASE,  in the last 72 hours CBC:  Recent Labs  09/22/13 0500 09/23/13 0409  WBC 8.7 9.2  HGB 9.0* 10.7*  HCT 27.4* 34.0*  MCV 96.8 98.3  PLT 103* 117*   Cardiac Enzymes:  Recent Labs  09/20/13 1707 09/21/13 09/21/13 0621  TROPONINI 0.85* 1.49* 1.65*    Imaging: Dg Chest Port 1 View  09/23/2013   CLINICAL DATA:  Respiratory failure.  EXAM: PORTABLE CHEST - 1 VIEW  COMPARISON:  09/22/2013.  FINDINGS: Endotracheal tube, left IJ line, NG tube in stable position. Persistent cardiomegaly and pulmonary venous congestion  present. Interstitial prominence present. These findings consistent with congestive heart failure. Left base atelectasis and/or infiltrate present. Left pleural effusion has improved. No pneumothorax.  IMPRESSION: 1. Stable line and tube positions. 2. Findings consistent with congestive heart failure with pulmonary interstitial edema again noted. Left pleural effusion has improved. 3. Left lower lobe atelectasis and/or infiltrate.   Electronically Signed   By: Maisie Fushomas  Register   On: 09/23/2013 07:36   Dg Chest Port 1 View  09/22/2013   CLINICAL DATA:  Respiratory failure.  EXAM: PORTABLE CHEST - 1 VIEW  COMPARISON:  Chest radiograph September 21, 2013  FINDINGS: Endotracheal tube tip projects 4.2 cm above the carina. Left internal jugular central venous catheter with distal tip at the cavoatrial junction. Nasogastric tube past the gastroesophageal junction region code though, tip is not visualized. No pneumothorax.  Stable cardiomegaly, worsening interstitial edema, with minimal patchy airspace opacity in left lung base, unchanged. Suspected small left pleural effusion. Soft tissue planes and included osseous structures are unchanged.  IMPRESSION: No apparent change in life-support lines.  Stable cardiomegaly with slightly worsening interstitial edema. Retrocardiac airspace opacity may reflect atelectasis or confluent edema with suspected small left pleural effusion.   Electronically  Signed   By: Awilda Metro   On: 09/22/2013 06:31    Cardiac Studies:  ECG:  SR LBBB pattern read by computer as accel junctional but P waves present   Telemetry:  NSR    Echo: EF 50-55% likely mild AS elevated PA pressure by TR velocity   Medications:   . antiseptic oral rinse  15 mL Mouth Rinse QID  . cefTRIAXone (ROCEPHIN)  IV  1 g Intravenous Q24H  . chlorhexidine  15 mL Mouth Rinse BID  . darbepoetin (ARANESP) injection - DIALYSIS  100 mcg Intravenous Q Wed-HD  . doxercalciferol  8 mcg Intravenous Q M,W,F-HD  .  enoxaparin (LOVENOX) injection  30 mg Subcutaneous Q24H  . pantoprazole (PROTONIX) IV  40 mg Intravenous QHS     . sodium chloride 7 mL/hr at 09/22/13 1711  . dextrose 5 % and 0.45% NaCl 30 mL/hr at 09/22/13 1500    Assessment/Plan:  PEA:  Recurrent episodes.  No heart block By definition bradycardia is not the reason for her events as she always had A pulse  Would be unlikely for these episodes to be cardiac especially if she received no meds during CPR but recovered  Has been stable hemodynamically while on vent and extubated with no arrhythmia on telemtry    Mild elevation in troponin Not likely significant in setting of CRF and CPR.  No further cardiac recommendations at this time  Agree with possibility of PD and letting BP run on high side.  Plans per renal  Charlton Haws 09/23/2013, 12:13 PM

## 2013-09-23 NOTE — Progress Notes (Signed)
Subjective: Patient somewhat difficult to arouse but once awake able to participate.  Sister in the room and attempted to talk once she was recognized.  Objective: Current vital signs: BP 172/70  Pulse 80  Temp(Src) 98.5 F (36.9 C) (Oral)  Resp 22  Ht 5\' 2"  (1.575 m)  Wt 83.7 kg (184 lb 8.4 oz)  BMI 33.74 kg/m2  SpO2 100% Vital signs in last 24 hours: Temp:  [97.5 F (36.4 C)-99.2 F (37.3 C)] 98.5 F (36.9 C) (01/15 0810) Pulse Rate:  [34-110] 80 (01/15 0810) Resp:  [0-29] 22 (01/15 0810) BP: (144-202)/(34-71) 172/70 mmHg (01/15 0810) SpO2:  [97 %-100 %] 100 % (01/15 0810) Arterial Line BP: (86-226)/(35-92) 171/57 mmHg (01/15 0800) FiO2 (%):  [40 %] 40 % (01/15 0851) Weight:  [82 kg (180 lb 12.4 oz)-83.7 kg (184 lb 8.4 oz)] 83.7 kg (184 lb 8.4 oz) (01/15 0343)  Intake/Output from previous day: 01/14 0701 - 01/15 0700 In: 1294.6 [I.V.:1244.6; IV Piggyback:50] Out: 2000  Intake/Output this shift: Total I/O In: 20.7 [I.V.:20.7] Out: -  Nutritional status:    Neurologic Exam: Mental Status:  Awakens when name called and with some light sternal rub. Follows simple commands. Attempts verbalizations but intibated.  Cranial Nerves:  II: Blinks to confrontation bilaterally, pupils right 3 mm, left 3 mm,and reactive bilaterally  III,IV,VI: EOM's grossly intact bilaterally. Tracks examiner around the room and fixes..  V,VII: corneal reflex present bilaterally  VIII: grossly intact  IX,X: gag reflex not tested, XI: trapezius strength unable to test bilaterally  XII: tongue strength unable to test  Motor:  Moves all extremities spontaneously and to command.  Sensory:  Withdraws to noxious stimuli throughout   Lab Results: Basic Metabolic Panel:  Recent Labs Lab 09/20/13 0913 09/20/13 0914 09/20/13 1155 09/21/13 0620 09/22/13 0500 09/23/13 0409  NA 140 138 140 142 139 139  K 4.5 4.3 4.4 4.8 4.8 4.7  CL 94* 100 98 100 97 94*  CO2 23  --  21 25 22 20   GLUCOSE  153* 159* 240* 91 104* 177*  BUN 38* 44* 41* 48* 55* 31*  CREATININE 7.22* 7.60* 6.92* 8.38* 9.66* 5.81*  CALCIUM 9.4  --  9.0 8.9 9.0 9.5  MG  --   --  2.4  --   --   --   PHOS  --   --  5.0*  --   --   --     Liver Function Tests:  Recent Labs Lab 09/20/13 0913 09/20/13 1155 09/22/13 0500  AST 129* 121* 34  ALT 101* 96* 51*  ALKPHOS 105 101 97  BILITOT 0.4 0.6 0.4  PROT 7.0 6.7 6.0  ALBUMIN 3.4* 3.2* 2.7*    Recent Labs Lab 09/20/13 1155  AMYLASE 132*   No results found for this basename: AMMONIA,  in the last 168 hours  CBC:  Recent Labs Lab 09/20/13 0913 09/20/13 0914 09/21/13 0620 09/22/13 0500 09/23/13 0409  WBC 10.1  --  8.8 8.7 9.2  NEUTROABS 8.2*  --   --   --   --   HGB 11.2* 12.6 9.5* 9.0* 10.7*  HCT 33.5* 37.0 29.5* 27.4* 34.0*  MCV 96.3  --  97.0 96.8 98.3  PLT 92*  --  107* 103* 117*    Cardiac Enzymes:  Recent Labs Lab 09/20/13 1155 09/20/13 1707 09/21/13 09/21/13 0621  TROPONINI 0.54* 0.85* 1.49* 1.65*    Lipid Panel: No results found for this basename: CHOL, TRIG, HDL, CHOLHDL, VLDL, LDLCALC,  in the last 168 hours  CBG:  Recent Labs Lab 09/22/13 1606 09/22/13 2005 09/22/13 2343 09/23/13 0339 09/23/13 0825  GLUCAP 113* 125* 159* 179* 182*    Microbiology: Results for orders placed during the hospital encounter of 09/20/13  MRSA PCR SCREENING     Status: None   Collection Time    09/20/13  1:06 PM      Result Value Range Status   MRSA by PCR NEGATIVE  NEGATIVE Final   Comment:            The GeneXpert MRSA Assay (FDA     approved for NASAL specimens     only), is one component of a     comprehensive MRSA colonization     surveillance program. It is not     intended to diagnose MRSA     infection nor to guide or     monitor treatment for     MRSA infections.    Coagulation Studies:  Recent Labs  09/20/13 1155  LABPROT 14.8  INR 1.19    Imaging: Dg Chest Port 1 View  09/23/2013   CLINICAL DATA:   Respiratory failure.  EXAM: PORTABLE CHEST - 1 VIEW  COMPARISON:  09/22/2013.  FINDINGS: Endotracheal tube, left IJ line, NG tube in stable position. Persistent cardiomegaly and pulmonary venous congestion present. Interstitial prominence present. These findings consistent with congestive heart failure. Left base atelectasis and/or infiltrate present. Left pleural effusion has improved. No pneumothorax.  IMPRESSION: 1. Stable line and tube positions. 2. Findings consistent with congestive heart failure with pulmonary interstitial edema again noted. Left pleural effusion has improved. 3. Left lower lobe atelectasis and/or infiltrate.   Electronically Signed   By: Maisie Fus  Register   On: 09/23/2013 07:36   Dg Chest Port 1 View  09/22/2013   CLINICAL DATA:  Respiratory failure.  EXAM: PORTABLE CHEST - 1 VIEW  COMPARISON:  Chest radiograph September 21, 2013  FINDINGS: Endotracheal tube tip projects 4.2 cm above the carina. Left internal jugular central venous catheter with distal tip at the cavoatrial junction. Nasogastric tube past the gastroesophageal junction region code though, tip is not visualized. No pneumothorax.  Stable cardiomegaly, worsening interstitial edema, with minimal patchy airspace opacity in left lung base, unchanged. Suspected small left pleural effusion. Soft tissue planes and included osseous structures are unchanged.  IMPRESSION: No apparent change in life-support lines.  Stable cardiomegaly with slightly worsening interstitial edema. Retrocardiac airspace opacity may reflect atelectasis or confluent edema with suspected small left pleural effusion.   Electronically Signed   By: Awilda Metro   On: 09/22/2013 06:31   Dg Chest Port 1 View  09/21/2013   CLINICAL DATA:  Respiratory distress.  Intubated patient.  EXAM: PORTABLE CHEST - 1 VIEW  COMPARISON:  09/20/2013  FINDINGS: Endotracheal tube tip lies 3 cm above the carina. Nasogastric tube passes below the diaphragm well into the stomach.  Left internal jugular central venous line tip lies the caval atrial junction.  Mild hazy airspace opacity is noted in the medial right lung base. This may reflect atelectasis. Infiltrate is possible. Lungs are otherwise clear. No pleural effusion or pneumothorax  IMPRESSION: 1. Support apparatus is stable in well positioned. 2. Mild hazy opacity in the medial right lung base, likely atelectasis. This potentially could reflect a small area of infiltrate or even asymmetric edema. Lungs are otherwise clear. Findings are similar to the prior exam allowing for differences in patient positioning and technique.   Electronically Signed  By: Amie Portlandavid  Ormond M.D.   On: 09/21/2013 12:27    Medications:  I have reviewed the patient's current medications. Scheduled: . antiseptic oral rinse  15 mL Mouth Rinse QID  . cefTRIAXone (ROCEPHIN)  IV  1 g Intravenous Q24H  . chlorhexidine  15 mL Mouth Rinse BID  . darbepoetin (ARANESP) injection - DIALYSIS  100 mcg Intravenous Q Wed-HD  . doxercalciferol  8 mcg Intravenous Q M,W,F-HD  . feeding supplement (NEPRO CARB STEADY)  1,000 mL Per Tube Q24H  . pantoprazole (PROTONIX) IV  40 mg Intravenous QHS    Assessment/Plan: Status post PEA arrest on 09/20/2013, now responsive with regaining of consciousness. She has no focal deficits at this point. Now following commands as well which is improvement from yesterday.   Will continue to follow with you.    LOS: 3 days   Thana FarrLeslie Conchetta Lamia, MD [facility] [phone] 09/23/2013  9:21 AM

## 2013-09-23 NOTE — Procedures (Signed)
**Note De-Identified Cleotilde Spadaccini Obfuscation** Extubation Procedure Note  Patient Details:   Name: Briana Gould DOB: 24-Sep-1945 MRN: [id number]   Airway Documentation:  Airway 7.5 mm (Active)  Secured at (cm) 23 cm 09/23/2013  8:10 AM  Measured From Lips 09/23/2013  8:10 AM  Secured Location Center 09/23/2013  8:10 AM  Secured By [employer] Tube Holder 09/23/2013  8:10 AM  Tube Holder Repositioned Yes 09/23/2013  8:10 AM  Cuff Pressure (cm H2O) 26 cm H2O 09/23/2013  8:10 AM  Site Condition Dry 09/23/2013  8:10 AM    Evaluation  O2 sats: stable throughout Complications: No apparent complications Patient did tolerate procedure well. Bilateral Breath Sounds: Clear;Diminished Suctioning: Airway Yes No stridor noted, + leak Dajane Valli, Megan SalonWendy Cooper 09/23/2013, 9:39 AM

## 2013-09-23 NOTE — Evaluation (Signed)
Clinical/Bedside Swallow Evaluation Patient Details  Name: Briana Gould MRN: [id number] Date of Birth: 1946-02-28  Today's Date: 09/23/2013 Time: [id number] SLP Time Calculation (min): 14 min  Past Medical History:  Past Medical History  Diagnosis Date  . End stage renal disease on dialysis   . Peripheral arterial occlusive disease   . Hypertension   . Cardiac arrest   . Diabetes mellitus type 2, uncontrolled, with complications   . DVT (deep venous thrombosis)      Right internal jugular vein  . CAD (coronary artery disease), native coronary artery 2011    Diffuse distal vessel CAD noted at cardiac catheterization   Past Surgical History:  Past Surgical History  Procedure Laterality Date  . Below knee leg amputation Left   . Av fistula placement Left    HPI:  68 year old female w/ ESRD, [facility] after being treated for UGIB while on coumadin for Right IJ thrombus stroke prevention. Was in usual state of health, getting stronger after her d/c. On 1/12 was at regular scheduled HD. About 20 minutes into HD she slumped over and became unresponsive. Initial rhythm was reported as PEA. CPR started. Reported 20 minutes CPR before ROSC, no drugs. Transferred to [facility] s/p arrest. Intubated from 1/12 to 1/15. Started on clear liquid diet today, but RN states pt has been lethargic, no PO intake yet. CT shows age uncertain infarct in mid pons.    Assessment / Plan / Recommendation Clinical Impression  Pt demonstrated signficant confusion impairing pts awareness of POs. Pt would not accept more than one sip of water, which did result in slight wet vocal quality. Mostly pt was saying "[employer] the [employer]" repeatedly though she would occasionally follow commands. With max assist she would not take a bite of puree. Overall, she is not yet appropriate for PO intake. Will f/u in am to try again.     Aspiration Risk  Moderate    Diet Recommendation NPO         Other  Recommendations Oral Care Recommendations: Oral care Q4 per protocol   Follow Up Recommendations       Frequency and Duration min 2x/week  2 weeks   Pertinent Vitals/Pain NA    SLP Swallow Goals     Swallow Study Prior Functional Status       General HPI: 68 year old female w/ ESRD, [facility] after being treated for UGIB while on coumadin for Right IJ thrombus stroke prevention. Was in usual state of health, getting stronger after her d/c. On 1/12 was at regular scheduled HD. About 20 minutes into HD she slumped over and became unresponsive. Initial rhythm was reported as PEA. CPR started. Reported 20 minutes CPR before ROSC, no drugs. Transferred to [facility] s/p arrest. Intubated from 1/12 to 1/15. Started on clear liquid diet today, but RN states pt has been lethargic, no PO intake yet. CT shows age uncertain infarct in mid pons.  Type of Study: Bedside swallow evaluation Previous Swallow Assessment: none in chart Diet Prior to this Study: Thin liquids Temperature Spikes Noted: No Respiratory Status: Nasal cannula History of Recent Intubation: Yes Length of Intubations (days): 4 days Date extubated: 09/23/13 Behavior/Cognition: Confused;Uncooperative;Distractible Oral Cavity - Dentition: Adequate natural dentition Self-Feeding Abilities: Needs assist Patient Positioning: Upright in bed Baseline Vocal Quality: Hoarse Volitional Cough: Strong Volitional Swallow: Unable to elicit    Oral/Motor/Sensory Function Overall Oral Motor/Sensory Function: Appears within functional limits  for tasks assessed   Ice Chips     Thin Liquid Thin Liquid: Impaired Presentation: Cup Oral Phase Impairments: Poor awareness of bolus;Reduced labial seal Pharyngeal  Phase Impairments: Wet Vocal Quality    Nectar Thick Nectar Thick Liquid: Not tested   Honey Thick Honey Thick Liquid: Not tested   Puree Puree:  (refused)   Solid   GO    Solid: Not tested       Harlon Ditty, MA CCC-SLP [phone]  Desha Bitner, Riley Nearing 09/23/2013,1:58 PM

## 2013-09-24 ENCOUNTER — Inpatient Hospital Stay (HOSPITAL_COMMUNITY): Payer: Medicare Other

## 2013-09-24 LAB — CBC
HEMATOCRIT: 31 % — AB (ref 36.0–46.0)
Hemoglobin: 10.2 g/dL — ABNORMAL LOW (ref 12.0–15.0)
MCH: 31.5 pg (ref 26.0–34.0)
MCHC: 32.9 g/dL (ref 30.0–36.0)
MCV: 95.7 fL (ref 78.0–100.0)
Platelets: 152 10*3/uL (ref 150–400)
RBC: 3.24 MIL/uL — ABNORMAL LOW (ref 3.87–5.11)
RDW: 16.9 % — ABNORMAL HIGH (ref 11.5–15.5)
WBC: 9.6 10*3/uL (ref 4.0–10.5)

## 2013-09-24 LAB — GLUCOSE, CAPILLARY
GLUCOSE-CAPILLARY: 134 mg/dL — AB (ref 70–99)
GLUCOSE-CAPILLARY: 147 mg/dL — AB (ref 70–99)
GLUCOSE-CAPILLARY: 166 mg/dL — AB (ref 70–99)
GLUCOSE-CAPILLARY: 83 mg/dL (ref 70–99)
Glucose-Capillary: 142 mg/dL — ABNORMAL HIGH (ref 70–99)
Glucose-Capillary: 161 mg/dL — ABNORMAL HIGH (ref 70–99)

## 2013-09-24 LAB — BASIC METABOLIC PANEL
BUN: 44 mg/dL — ABNORMAL HIGH (ref 6–23)
CHLORIDE: 96 meq/L (ref 96–112)
CO2: 21 mEq/L (ref 19–32)
Calcium: 9.3 mg/dL (ref 8.4–10.5)
Creatinine, Ser: 7.51 mg/dL — ABNORMAL HIGH (ref 0.50–1.10)
GFR calc Af Amer: 6 mL/min — ABNORMAL LOW (ref >90)
GFR calc non Af Amer: 5 mL/min — ABNORMAL LOW (ref >90)
GLUCOSE: 132 mg/dL — AB (ref 70–99)
Potassium: 4.6 mEq/L (ref 3.7–5.3)
SODIUM: 138 meq/L (ref 137–147)

## 2013-09-24 MED ORDER — FAMOTIDINE 20 MG PO TABS
20.0000 mg | ORAL_TABLET | Freq: Every day | ORAL | Status: DC
  Administered 2013-09-24 – 2013-09-30 (×7): 20 mg via ORAL
  Filled 2013-09-24 (×9): qty 1

## 2013-09-24 MED ORDER — LEVOFLOXACIN 500 MG PO TABS
500.0000 mg | ORAL_TABLET | ORAL | Status: AC
  Administered 2013-09-26 – 2013-09-28 (×2): 500 mg via ORAL
  Filled 2013-09-24 (×2): qty 1

## 2013-09-24 MED ORDER — DOXERCALCIFEROL 4 MCG/2ML IV SOLN
INTRAVENOUS | Status: AC
  Administered 2013-09-24: 8 ug via INTRAVENOUS
  Filled 2013-09-24: qty 4

## 2013-09-24 MED ORDER — ACETAMINOPHEN 325 MG PO TABS
650.0000 mg | ORAL_TABLET | Freq: Once | ORAL | Status: AC
  Administered 2013-09-24: 650 mg via ORAL

## 2013-09-24 MED ORDER — ACETAMINOPHEN 160 MG/5ML PO SOLN
650.0000 mg | ORAL | Status: DC | PRN
  Administered 2013-09-24: 650 mg via ORAL
  Filled 2013-09-24 (×2): qty 20.3

## 2013-09-24 MED ORDER — ACETAMINOPHEN 325 MG PO TABS
ORAL_TABLET | ORAL | Status: AC
  Administered 2013-09-24: 650 mg via ORAL
  Filled 2013-09-24: qty 2

## 2013-09-24 MED ORDER — RESOURCE THICKENUP CLEAR PO POWD
ORAL | Status: DC | PRN
  Filled 2013-09-24: qty 125

## 2013-09-24 MED ORDER — SEVELAMER CARBONATE 800 MG PO TABS
1600.0000 mg | ORAL_TABLET | Freq: Three times a day (TID) | ORAL | Status: DC
  Administered 2013-09-25 – 2013-09-30 (×10): 1600 mg via ORAL
  Filled 2013-09-24 (×25): qty 2

## 2013-09-24 MED ORDER — ACETAMINOPHEN 325 MG PO TABS: 650.0000 mg | ORAL_TABLET | ORAL | Status: DC | PRN

## 2013-09-24 MED ORDER — LEVOFLOXACIN 750 MG PO TABS
750.0000 mg | ORAL_TABLET | Freq: Once | ORAL | Status: AC
  Administered 2013-09-24: 750 mg via ORAL
  Filled 2013-09-24: qty 1

## 2013-09-24 NOTE — Progress Notes (Signed)
Subjective: Patient extubated.  Awake and alert.  Follows commands.  Objective: Current vital signs: BP 151/31  Pulse 82  Temp(Src) 98.7 F (37.1 C) (Oral)  Resp 20  Ht 5\' 2"  (1.575 m)  Wt 82.8 kg (182 lb 8.7 oz)  BMI 33.38 kg/m2  SpO2 100% Vital signs in last 24 hours: Temp:  [98.5 F (36.9 C)-99.3 F (37.4 C)] 98.7 F (37.1 C) (01/16 0800) Pulse Rate:  [31-95] 82 (01/16 0800) Resp:  [9-20] 20 (01/16 0800) BP: (131-215)/(20-81) 151/31 mmHg (01/16 0800) SpO2:  [98 %-100 %] 100 % (01/16 0800) Arterial Line BP: (116-148)/(47-105) 135/48 mmHg (01/15 2300) Weight:  [82.8 kg (182 lb 8.7 oz)] 82.8 kg (182 lb 8.7 oz) (01/16 0600)  Intake/Output from previous day: 01/15 0701 - 01/16 0700 In: 1028.7 [I.V.:978.7; IV Piggyback:50] Out: -  Intake/Output this shift: Total I/O In: 40 [I.V.:40] Out: -  Nutritional status: Dysphagia  Neurologic Exam: Mental Status: Alert, oriented, thought content appropriate.  Speech fluent without evidence of aphasia.  Able to follow 3 step commands without difficulty. Cranial Nerves: II: Discs flat bilaterally; Visual fields grossly normal, pupils equal, round, reactive to light and accommodation III,IV, VI: ptosis not present, extra-ocular motions intact bilaterally V,VII: smile symmetric, facial light touch sensation normal bilaterally VIII: hearing normal bilaterally IX,X: gag reflex present XI: bilateral shoulder shrug XII: midline tongue extension Motor: Able to lift both hands against gravity and off the bed.  Although weak able to lift the right leg off the bed.   Plantar: Mute on the right Deep Tendon Reflexes: 2+ and symmetric throughout   Lab Results: Basic Metabolic Panel:  Recent Labs Lab 09/20/13 0914 09/20/13 1155 09/21/13 0620 09/22/13 0500 09/23/13 0409 09/24/13 0450  NA 138 140 142 139 139 138  K 4.3 4.4 4.8 4.8 4.7 4.6  CL 100 98 100 97 94* 96  CO2  --  21 25 22 20 21   GLUCOSE 159* 240* 91 104* 177* 132*  BUN  44* 41* 48* 55* 31* 44*  CREATININE 7.60* 6.92* 8.38* 9.66* 5.81* 7.51*  CALCIUM  --  9.0 8.9 9.0 9.5 9.3  MG  --  2.4  --   --   --   --   PHOS  --  5.0*  --   --   --   --     Liver Function Tests:  Recent Labs Lab 09/20/13 0913 09/20/13 1155 09/22/13 0500  AST 129* 121* 34  ALT 101* 96* 51*  ALKPHOS 105 101 97  BILITOT 0.4 0.6 0.4  PROT 7.0 6.7 6.0  ALBUMIN 3.4* 3.2* 2.7*    Recent Labs Lab 09/20/13 1155  AMYLASE 132*   No results found for this basename: AMMONIA,  in the last 168 hours  CBC:  Recent Labs Lab 09/20/13 0913 09/20/13 0914 09/21/13 0620 09/22/13 0500 09/23/13 0409 09/24/13 0450  WBC 10.1  --  8.8 8.7 9.2 9.6  NEUTROABS 8.2*  --   --   --   --   --   HGB 11.2* 12.6 9.5* 9.0* 10.7* 10.2*  HCT 33.5* 37.0 29.5* 27.4* 34.0* 31.0*  MCV 96.3  --  97.0 96.8 98.3 95.7  PLT 92*  --  107* 103* 117* 152    Cardiac Enzymes:  Recent Labs Lab 09/20/13 1155 09/20/13 1707 09/21/13 09/21/13 0621  TROPONINI 0.54* 0.85* 1.49* 1.65*    Lipid Panel: No results found for this basename: CHOL, TRIG, HDL, CHOLHDL, VLDL, LDLCALC,  in the last 168 hours  CBG:  Recent Labs Lab 09/23/13 1727 09/23/13 2004 09/24/13 0033 09/24/13 0417 09/24/13 0803  GLUCAP 183* 169* 134* 147* 142*    Microbiology: Results for orders placed during the hospital encounter of 09/20/13  MRSA PCR SCREENING     Status: None   Collection Time    09/20/13  1:06 PM      Result Value Range Status   MRSA by PCR NEGATIVE  NEGATIVE Final   Comment:            The GeneXpert MRSA Assay (FDA     approved for NASAL specimens     only), is one component of a     comprehensive MRSA colonization     surveillance program. It is not     intended to diagnose MRSA     infection nor to guide or     monitor treatment for     MRSA infections.    Coagulation Studies: No results found for this basename: LABPROT, INR,  in the last 72 hours  Imaging: Dg Chest Port 1 View  09/24/2013    CLINICAL DATA:  Respiratory failure  EXAM: PORTABLE CHEST - 1 VIEW  COMPARISON:  09/23/2013  FINDINGS: Interval tracheal and esophageal extubation. Left IJ catheter in stable position, tip at the superior cavoatrial junction.  Unchanged cardiomegaly and pulmonary edema. These changes could obscure superimposed infection. No evidence of increased pleural fluid. No pneumothorax.  IMPRESSION: 1. Stable lung volumes after tracheal extubation. 2. Unchanged pulmonary edema, which could obscure superimposed consolidation.   Electronically Signed   By: Tiburcio PeaJonathan  Watts M.D.   On: 09/24/2013 06:23   Dg Chest Port 1 View  09/23/2013   CLINICAL DATA:  Respiratory failure.  EXAM: PORTABLE CHEST - 1 VIEW  COMPARISON:  09/22/2013.  FINDINGS: Endotracheal tube, left IJ line, NG tube in stable position. Persistent cardiomegaly and pulmonary venous congestion present. Interstitial prominence present. These findings consistent with congestive heart failure. Left base atelectasis and/or infiltrate present. Left pleural effusion has improved. No pneumothorax.  IMPRESSION: 1. Stable line and tube positions. 2. Findings consistent with congestive heart failure with pulmonary interstitial edema again noted. Left pleural effusion has improved. 3. Left lower lobe atelectasis and/or infiltrate.   Electronically Signed   By: Maisie Fushomas  Register   On: 09/23/2013 07:36    Medications:  I have reviewed the patient's current medications. Scheduled: . antiseptic oral rinse  15 mL Mouth Rinse QID  . cefTRIAXone (ROCEPHIN)  IV  1 g Intravenous Q24H  . chlorhexidine  15 mL Mouth Rinse BID  . darbepoetin (ARANESP) injection - DIALYSIS  100 mcg Intravenous Q Wed-HD  . doxercalciferol  8 mcg Intravenous Q M,W,F-HD  . enoxaparin (LOVENOX) injection  30 mg Subcutaneous Q24H  . pantoprazole (PROTONIX) IV  40 mg Intravenous QHS    Assessment/Plan: Status post PEA arrest on 09/20/2013.  Patient awake and alert.  Improved.     Recommendations: 1. No further neurologic intervention is recommended at this time.  If further questions arise, please call or page at that time.  Thank you for allowing neurology to participate in the care of this patient.    LOS: 4 days   Thana FarrLeslie Shacola Schussler, MD [facility] [phone] 09/24/2013  9:26 AM

## 2013-09-24 NOTE — Progress Notes (Signed)
Name: Briana Gould MRN: [id number] DOB: [birth year]    ADMISSION DATE:  09/20/2013 CONSULTATION DATE: 1/12   PRIMARY SERVICE: PCCM   CHIEF COMPLAINT: Cardiac arrest   BRIEF PATIENT DESCRIPTION:  68 year old female w/ ESRD, [facility] after being treated for UGIB while on coumadin for Right IJ thrombus stroke prevention. Was in usual state of health, getting stronger after her d/c. On 1/12 was at regular scheduled HD. About 20 minutes into HD she slumped over and became unresponsive. Initial rhythm was reported as PEA. CPR started. Reported 20 minutes CPR before ROSC, no drugs. Transferred to [facility] s/p arrest.   SIGNIFICANT EVENTS / STUDIES:  1/12 cardiac arrest/ PEA at HD. 20 minutes estimated to ROSC  1/12 Echocardiogram: LVEF 50-55%. No RWMAs. Moderate LVH. Probably mild AS. Mild MR. RVSP est 69 mmHg  1/12 CT head: Age uncertain small infarct mid pons. Small infarct in this area may be recent.  1/13 Cognition much improved. Failed SBT  1/15 Extubated successfully   LINES / TUBES:  ETT 1/12>>> 1/15  Left IJ 1/12 >>  Right radial 1/12 >>1/15  CULTURES:  Resp 1/13 >> canceled   ANTIBIOTICS:  Unasyn 1/12 >> 1/13  Ceftriaxone 1/13 >>   SUBJECTIVE: Awake, alert, follows commands, extubated yesterday with no immediate complications. No complaints. Pulled out A-line yesterday   VITAL SIGNS: Temp:  [98.5 F (36.9 C)-99.3 F (37.4 C)] 99.3 F (37.4 C) (01/16 0400) Pulse Rate:  [31-95] 83 (01/16 0700) Resp:  [9-20] 20 (01/16 0700) BP: (131-215)/(20-81) 131/38 mmHg (01/16 0700) SpO2:  [98 %-100 %] 99 % (01/16 0700) Arterial Line BP: (116-148)/(47-105) 135/48 mmHg (01/15 2300) FiO2 (%):  [40 %] 40 % (01/15 0851) Weight:  [82.8 kg (182 lb 8.7 oz)] 82.8 kg (182 lb 8.7 oz) (01/16 0600) HEMODYNAMICS:   VENTILATOR SETTINGS: Vent Mode:  [-] PSV;CPAP FiO2 (%):  [40 %] 40 % PEEP:  [5 cmH20] 5 cmH20 Pressure Support:  [5 cmH20] 5 cmH20 INTAKE /  OUTPUT: Intake/Output     01/15 0701 - 01/16 0700 01/16 0701 - 01/17 0700   I.V. (mL/kg) 898.7 (10.9)    IV Piggyback 50    Total Intake(mL/kg) 948.7 (11.5)    Other     Total Output       Net +948.7           PHYSICAL EXAMINATION: General: Awake, alert, NAD  Neuro: No focal deficits  HEENT: PERRL, EOMI, no icterus or pallor  Cardiovascular: RRR, III/VI SEM  Lungs: Scattered rhonchi  Abdomen: BS +, Soft, non-tender, ND, no OM  Musculoskeletal: Left AKA looks unremarkable  Skin: Intact   LABS:  CBC  Recent Labs Lab 09/22/13 0500 09/23/13 0409 09/24/13 0450  WBC 8.7 9.2 9.6  HGB 9.0* 10.7* 10.2*  HCT 27.4* 34.0* 31.0*  PLT 103* 117* 152   Coag's  Recent Labs Lab 09/20/13 1155  APTT 39*  INR 1.19   BMET  Recent Labs Lab 09/22/13 0500 09/23/13 0409 09/24/13 0450  NA 139 139 138  K 4.8 4.7 4.6  CL 97 94* 96  CO2 22 20 21   BUN 55* 31* 44*  CREATININE 9.66* 5.81* 7.51*  GLUCOSE 104* 177* 132*   Electrolytes  Recent Labs Lab 09/20/13 1155  09/22/13 0500 09/23/13 0409 09/24/13 0450  CALCIUM 9.0  < > 9.0 9.5 9.3  MG 2.4  --   --   --   --   PHOS 5.0*  --   --   --   --   < > =  values in this interval not displayed. Sepsis Markers  Recent Labs Lab 09/20/13 0918 09/20/13 1155  LATICACIDVEN 3.18*  --   PROCALCITON  --  0.52   ABG  Recent Labs Lab 09/20/13 0951 09/21/13 0353  PHART 7.367 7.398  PCO2ART 46.2* 43.6  PO2ART 281.0* 159.0*   Liver Enzymes  Recent Labs Lab 09/20/13 0913 09/20/13 1155 09/22/13 0500  AST 129* 121* 34  ALT 101* 96* 51*  ALKPHOS 105 101 97  BILITOT 0.4 0.6 0.4  ALBUMIN 3.4* 3.2* 2.7*   Cardiac Enzymes  Recent Labs Lab 09/20/13 0913  09/20/13 1155 09/20/13 1707 09/21/13 09/21/13 0621  TROPONINI  --   < > 0.54* 0.85* 1.49* 1.65*  PROBNP 57457.0*  --  47172.0*  --   --   --   < > = values in this interval not displayed. Glucose  Recent Labs Lab 09/23/13 0825 09/23/13 1727 09/23/13 2004  09/24/13 0033 09/24/13 0417 09/24/13 0803  GLUCAP 182* 183* 169* 134* 147* 142*    Imaging Dg Chest Port 1 View  09/24/2013   CLINICAL DATA:  Respiratory failure  EXAM: PORTABLE CHEST - 1 VIEW  COMPARISON:  09/23/2013  FINDINGS: Interval tracheal and esophageal extubation. Left IJ catheter in stable position, tip at the superior cavoatrial junction.  Unchanged cardiomegaly and pulmonary edema. These changes could obscure superimposed infection. No evidence of increased pleural fluid. No pneumothorax.  IMPRESSION: 1. Stable lung volumes after tracheal extubation. 2. Unchanged pulmonary edema, which could obscure superimposed consolidation.   Electronically Signed   By: Tiburcio Pea M.D.   On: 09/24/2013 06:23   Dg Chest Port 1 View  09/23/2013   CLINICAL DATA:  Respiratory failure.  EXAM: PORTABLE CHEST - 1 VIEW  COMPARISON:  09/22/2013.  FINDINGS: Endotracheal tube, left IJ line, NG tube in stable position. Persistent cardiomegaly and pulmonary venous congestion present. Interstitial prominence present. These findings consistent with congestive heart failure. Left base atelectasis and/or infiltrate present. Left pleural effusion has improved. No pneumothorax.  IMPRESSION: 1. Stable line and tube positions. 2. Findings consistent with congestive heart failure with pulmonary interstitial edema again noted. Left pleural effusion has improved. 3. Left lower lobe atelectasis and/or infiltrate.   Electronically Signed   By: Maisie Fus  Register   On: 09/23/2013 07:36   CXR 1/16 >>>Stable lung volumes after tracheal extubation. Unchanged pulmonary edema, which could obscure superimposed consolidation.  ASSESSMENT / PLAN:  PULMONARY  A:  Acute respiratory failure s/p arrest  Extubated 1/15  P:  Continue oxygen support, goal O2 > 92%, begin to wean to off  CARDIOVASCULAR  A:  PEA arrest (20 minutes to ROSC)  H/o recurrent cardiac arrest during HD  H/O severe cerebrovascular disease  R IJ thrombus   HTN, resolved  P:  Cont monitoring  PRN antihypertensive, hydralazine Needs MAP > 65 mmHg for cerebral perfusion   RENAL  A:  ESRD, dialysis dependent  P:  Nephrology following   GASTROINTESTINAL  A:  H/o UGIB P:  D/c protonix, begin pepcid SLP for swallow eval again today - passed Diet to nectar-DYS 3 per SLP  HEMATOLOGIC  A:  Anemia of chronic disease, stable  Thrombocytopenia, mild --improving P:  DVT px: SCDs  Monitor CBC intermittently   INFECTIOUS  A: Purulent resp secretions, culture canceled P:  D/c rocephin Start levaquin per rx  ENDOCRINE  A: DM type 2  P:  Change CBG to [facility]  NEUROLOGIC  A: Acute post anoxic encephalopathy, appears improved  Severe cerebrovascular disease  P:  Supportive care   TODAY'S SUMMARY: Swallow eval and advance diet, HD today, transfer to SDU, start peripheral IV, d/c LIJ  Lewie Chamber, MSIV, Medical Student 09/24/2013 8:34 AM   PCCM ATTENDING: I have interviewed and examined the patient and reviewed the database. I have formulated the assessment and plan as reflected in the note above with amendments made by me. I have discussed with Dr Arlean Hopping. We will transfer to SDU today. [facility] will assume her care as of 1/17 AM and PCCM to sign off  Billy Fischer, MD;  PCCM service; Mobile [phone]   Pulmonary and Critical Care Medicine [facility] Pager: [phone]  09/24/2013, 8:16 AM

## 2013-09-24 NOTE — Progress Notes (Signed)
Patient received to room 3sbed#13 in bed from dialysis. Patient is alert and oriented x4. Settled into the room, instructed on safety in room.

## 2013-09-24 NOTE — Progress Notes (Signed)
[name], female   DOB: 08-Sep-1946, 68 y.o.   MRN: [id number]    Subjective:  Much more alert Asking appr questions  She does not seem keen on the idea of peritoneal dialysis   Objective:  Filed Vitals:   09/24/13 0400 09/24/13 0500 09/24/13 0600 09/24/13 0700  BP: 143/20 164/81  131/38  Pulse: 74 80  83  Temp: 99.3 F (37.4 C)     TempSrc: Oral     Resp: 18 15  20   Height:      Weight:   182 lb 8.7 oz (82.8 kg)   SpO2: 100% 100%  99%    Intake/Output from previous day:  Intake/Output Summary (Last 24 hours) at 09/24/13 [id number] Last data filed at 09/24/13 0500  Gross per 24 hour  Intake 911.67 ml  Output      0 ml  Net 911.67 ml    Physical Exam: Affect appropriate Chronically ill black female  HEENT: intubated LIJ catheter  Neck supple with no adenopathy JVP normal no bruits no thyromegaly Lungs rhonchi no wheezing and good diaphragmatic motion Heart:  S1/S2 SEM  murmur, no rub, gallop or click PMI normal Abdomen: benighn, BS positve, no tenderness, no AAA no bruit.  No HSM or HJR Left BKA  No edema Neuro non-focal Skin warm and dry Fistula LUE    Lab Results: Basic Metabolic Panel:  Recent Labs  11/91/4701/15/15 0409 09/24/13 0450  NA 139 138  K 4.7 4.6  CL 94* 96  CO2 20 21  GLUCOSE 177* 132*  BUN 31* 44*  CREATININE 5.81* 7.51*  CALCIUM 9.5 9.3   Liver Function Tests:  Recent Labs  09/22/13 0500  AST 34  ALT 51*  ALKPHOS 97  BILITOT 0.4  PROT 6.0  ALBUMIN 2.7*   No results found for this basename: LIPASE, AMYLASE,  in the last 72 hours CBC:  Recent Labs  09/23/13 0409 09/24/13 0450  WBC 9.2 9.6  HGB 10.7* 10.2*  HCT 34.0* 31.0*  MCV 98.3 95.7  PLT 117* 152   Cardiac Enzymes: No results found for this basename: CKTOTAL, CKMB, CKMBINDEX, TROPONINI,  in the last 72 hours  Imaging: Dg Chest Port 1 View  09/24/2013   CLINICAL DATA:  Respiratory failure  EXAM: PORTABLE CHEST - 1 VIEW  COMPARISON:  09/23/2013  FINDINGS:  Interval tracheal and esophageal extubation. Left IJ catheter in stable position, tip at the superior cavoatrial junction.  Unchanged cardiomegaly and pulmonary edema. These changes could obscure superimposed infection. No evidence of increased pleural fluid. No pneumothorax.  IMPRESSION: 1. Stable lung volumes after tracheal extubation. 2. Unchanged pulmonary edema, which could obscure superimposed consolidation.   Electronically Signed   By: Tiburcio PeaJonathan  Watts M.D.   On: 09/24/2013 06:23   Dg Chest Port 1 View  09/23/2013   CLINICAL DATA:  Respiratory failure.  EXAM: PORTABLE CHEST - 1 VIEW  COMPARISON:  09/22/2013.  FINDINGS: Endotracheal tube, left IJ line, NG tube in stable position. Persistent cardiomegaly and pulmonary venous congestion present. Interstitial prominence present. These findings consistent with congestive heart failure. Left base atelectasis and/or infiltrate present. Left pleural effusion has improved. No pneumothorax.  IMPRESSION: 1. Stable line and tube positions. 2. Findings consistent with congestive heart failure with pulmonary interstitial edema again noted. Left pleural effusion has improved. 3. Left lower lobe atelectasis and/or infiltrate.   Electronically Signed   By: Maisie Fushomas  Register   On: 09/23/2013 07:36    Cardiac Studies:  ECG:  SR  LBBB pattern read by computer as accel junctional but P waves present   Telemetry:  NSR    Echo: EF 50-55% likely mild AS elevated PA pressure by TR velocity   Medications:   . antiseptic oral rinse  15 mL Mouth Rinse QID  . cefTRIAXone (ROCEPHIN)  IV  1 g Intravenous Q24H  . chlorhexidine  15 mL Mouth Rinse BID  . darbepoetin (ARANESP) injection - DIALYSIS  100 mcg Intravenous Q Wed-HD  . doxercalciferol  8 mcg Intravenous Q M,W,F-HD  . enoxaparin (LOVENOX) injection  30 mg Subcutaneous Q24H  . pantoprazole (PROTONIX) IV  40 mg Intravenous QHS     . sodium chloride 7 mL/hr at 09/22/13 1711  . dextrose 5 % and 0.45% NaCl 30 mL/hr  at 09/23/13 2032    Assessment/Plan:  PEA:  Recurrent episodes.  No heart block By definition bradycardia is not the reason for her events as she always had A pulse  Would be unlikely for these episodes to be cardiac especially if she received no meds during CPR but recovered  Has been stable hemodynamically while on vent and extubated with no arrhythmia on telemtry    Mild elevation in troponin Not likely significant in setting of CRF and CPR.  No further cardiac recommendations at this time  Agree with possibility of PD and letting BP run on high side.  Plans per renal  Will sign off   Charlton Haws 09/24/2013, 8:22 AM

## 2013-09-24 NOTE — Progress Notes (Signed)
[facility] Progress Note   Subjective: much more alert, conversant, but slow  Filed Vitals:   09/24/13 0500 09/24/13 0600 09/24/13 0700 09/24/13 0800  BP: 164/81  131/38 151/31  Pulse: 80  83 82  Temp:    98.7 F (37.1 C)  TempSrc:    Oral  Resp: 15  20 20   Height:      Weight:  82.8 kg (182 lb 8.7 oz)    SpO2: 100%  99% 100%  Exam Awake, no distress No jvd Chest clear bilat RRR no MRG Abd soft, nt/nd Trace RLE edema, L BKA well healed LUA AVF patent Neuro nonfocal, gen weakness, conversant, awake  Dialysis: MWF at [facility]  4.5h 79kg BFR 250 (just started buttonhole to AVF) Profile 2 F180 No heparin  Hectorol 8 Epo 20K Venofer none  Assessment/Plan:  1. 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 Gentle HD with no fluid removal, keep BP over 140, raise dry wt as needed.   2. ESRD: HD today 3. Anemia / CKD: on max EPO at center, Hb 12 > 10 here. Resumed esa w darbe 100/wk. Tsat 17%, high ferritin. No IV fe for now.  4. MBD / CKD: adjCa 10, phos 5, will resume binder, cont vit D 5. HTN/volume: BP normal to high, was on 4 bp meds at home, none here.  Allow permissive HTN due to #1, keep SBP over 140 including while on dialysis.  6. PAD hx L BKA 7. CAD, hx diffuse "distal" disease on cath 2011   Vinson Moselle MD pager [phone]    cell [phone] 09/24/2013, 10:25 AM   Recent Labs Lab 09/20/13 0914 09/20/13 1155  09/22/13 0500 09/23/13 0409 09/24/13 0450  NA 138 140  < > 139 139 138  K 4.3 4.4  < > 4.8 4.7 4.6  CL 100 98  < > 97 94* 96  CO2  --  21  < > 22 20 21   GLUCOSE 159* 240*  < > 104* 177* 132*  BUN 44* 41*  < > 55* 31* 44*  CREATININE 7.60* 6.92*  < > 9.66* 5.81* 7.51*  CALCIUM  --  9.0  < > 9.0 9.5 9.3  PHOS  --  5.0*  --   --   --   --   < > = values in this interval not displayed.  Recent Labs Lab 09/20/13 0913 09/20/13 1155 09/22/13 0500   AST 129* 121* 34  ALT 101* 96* 51*  ALKPHOS 105 101 97  BILITOT 0.4 0.6 0.4  PROT 7.0 6.7 6.0  ALBUMIN 3.4* 3.2* 2.7*    Recent Labs Lab 09/20/13 0913  09/22/13 0500 09/23/13 0409 09/24/13 0450  WBC 10.1  < > 8.7 9.2 9.6  NEUTROABS 8.2*  --   --   --   --   HGB 11.2*  < > 9.0* 10.7* 10.2*  HCT 33.5*  < > 27.4* 34.0* 31.0*  MCV 96.3  < > 96.8 98.3 95.7  PLT 92*  < > 103* 117* 152  < > = values in this interval not displayed. Marland Kitchen antiseptic oral rinse  15 mL Mouth Rinse QID  . chlorhexidine  15 mL Mouth Rinse BID  . darbepoetin (ARANESP) injection - DIALYSIS  100 mcg Intravenous Q Wed-HD  . doxercalciferol  8 mcg Intravenous Q M,W,F-HD  . enoxaparin (LOVENOX) injection  30 mg Subcutaneous Q24H  . famotidine  20 mg  Oral QHS  . [START ON 09/26/2013] levofloxacin  500 mg Oral Q48H  . levofloxacin  750 mg Oral Once   . sodium chloride 7 mL/hr at 09/22/13 1711   sodium chloride, hydrALAZINE, ondansetron (ZOFRAN) IV, RESOURCE THICKENUP CLEAR

## 2013-09-24 NOTE — Procedures (Signed)
I was present at this dialysis session, have reviewed the session itself and made  appropriate changes  Vinson Moselleob Aarushi Hemric MD (pgr) [phone]    ([phone]) [phone] 09/24/2013, 1:06 PM

## 2013-09-24 NOTE — Progress Notes (Signed)
ANTIBIOTIC CONSULT NOTE - INITIAL  Pharmacy Consult for Levaquin Indication: Tracheobronchitis   No Known Allergies  Patient Measurements: Height: 5\' 2"  (157.5 cm) Weight: 182 lb 8.7 oz (82.8 kg) IBW/kg (Calculated) : 50.1 Adjusted Body Weight: n/a  Vital Signs: Temp: 98.7 F (37.1 C) (01/16 0800) Temp src: Oral (01/16 0800) BP: 151/31 mmHg (01/16 0800) Pulse Rate: 82 (01/16 0800) Intake/Output from previous day: 01/15 0701 - 01/16 0700 In: 1028.7 [I.V.:978.7; IV Piggyback:50] Out: -  Intake/Output from this shift: Total I/O In: 40 [I.V.:40] Out: -   Labs:  Recent Labs  09/22/13 0500 09/23/13 0409 09/24/13 0450  WBC 8.7 9.2 9.6  HGB 9.0* 10.7* 10.2*  PLT 103* 117* 152  CREATININE 9.66* 5.81* 7.51*   Estimated Creatinine Clearance: 7.3 ml/min (by C-G formula based on Cr of 7.51). No results found for this basename: VANCOTROUGH, Leodis Binet, VANCORANDOM, GENTTROUGH, GENTPEAK, GENTRANDOM, TOBRATROUGH, TOBRAPEAK, TOBRARND, AMIKACINPEAK, AMIKACINTROU, AMIKACIN,  in the last 72 hours   Microbiology: Recent Results (from the past 720 hour(s))  MRSA PCR SCREENING     Status: None   Collection Time    09/20/13  1:06 PM      Result Value Range Status   MRSA by PCR NEGATIVE  NEGATIVE Final   Comment:            The GeneXpert MRSA Assay (FDA     approved for NASAL specimens     only), is one component of a     comprehensive MRSA colonization     surveillance program. It is not     intended to diagnose MRSA     infection nor to guide or     monitor treatment for     MRSA infections.    Medical History: Past Medical History  Diagnosis Date  . End stage renal disease on dialysis   . Peripheral arterial occlusive disease   . Hypertension   . Cardiac arrest   . Diabetes mellitus type 2, uncontrolled, with complications   . DVT (deep venous thrombosis)      Right internal jugular vein  . CAD (coronary artery disease), native coronary artery 2011    Diffuse distal  vessel CAD noted at cardiac catheterization    Medications:  Prescriptions prior to admission  Medication Sig Dispense Refill  . amLODipine (NORVASC) 5 MG tablet Take 5 mg by mouth daily.      Marland Kitchen b complex-vitamin c-folic acid (NEPHRO-VITE) 0.8 MG TABS tablet Take 1 tablet by mouth daily.      Marland Kitchen doxazosin (CARDURA) 8 MG tablet Take 8 mg by mouth daily.      . famotidine (PEPCID) 40 MG tablet Take 40 mg by mouth daily.      . hydrALAZINE (APRESOLINE) 50 MG tablet Take 50 mg by mouth 2 (two) times daily.      Marland Kitchen lisinopril (PRINIVIL,ZESTRIL) 40 MG tablet Take 40 mg by mouth daily.      . Nutritional Supplements (FEEDING SUPPLEMENT, NEPRO CARB STEADY,) LIQD Take 237 mLs by mouth daily.      . sevelamer carbonate (RENVELA) 800 MG tablet Take 1,600 mg by mouth 3 (three) times daily with meals.       . traMADol (ULTRAM) 50 MG tablet Take 50 mg by mouth every 12 (twelve) hours as needed (for pain).        Assessment: 53 YoF w/ ESRD on HD became unresponsive during HD and brought to [facility]. She was extubated on 1/15. She is currently on IV Rocephin  and is to switch to Levaquin for tracheobronchitis. She is planned for HD today. WBC wnl, Afebrile.   Goal of Therapy:  Eradication of infection  Plan:  1) Stop IV Rocephin 2) Give Levaquin 750 mg PO x 1 dose today followed by 500 mg PO x 48 hours for 5 total days of abx therapy  3) Monitor CBC and patient clinical status.   Vinnie LevelBenjamin Jabri Blancett, PharmD.  Clinical Pharmacist Pager [phone]

## 2013-09-24 NOTE — Progress Notes (Signed)
Speech Language Pathology Treatment: Dysphagia  Patient Details Name: Briana Gould MRN: [id number] DOB: 1946-03-08 Today's Date: 09/24/2013 Time: [id number] SLP Time Calculation (min): 15 min  Assessment / Plan / Recommendation Clinical Impression  Pt demonstrates improved attention to PO trials this am, still requires moderate cues for initiation and problem solving. Pt does continue to exhibit overt cough with thin liquids and anterior labial spillage with cup sips. Question if pontine infarct could also play a role in dysphagia on top of recent intubation. Recommend Dys 3 diet and nectar thick liquids with f/u for upgraded trials. Give pills whole in puree.    HPI HPI: 68 year old female w/ ESRD, recently [facility] 12/21 after being treated for UGIB while on coumadin for Right IJ thrombus stroke prevention. Was in usual state of health, getting stronger after her d/c. On 1/12 was at regular scheduled HD. About 20 minutes into HD she slumped over and became unresponsive. Initial rhythm was reported as PEA. CPR started. Reported 20 minutes CPR before ROSC, no drugs. Transferred to [facility] s/p arrest. Intubated from 1/12 to 1/15. Started on clear liquid diet today, but RN states pt has been lethargic, no PO intake yet. CT shows age uncertain infarct in mid pons.    Pertinent Vitals NA  SLP Plan  Goals updated    Recommendations Diet recommendations: Dysphagia 3 (mechanical soft);Nectar-thick liquid Liquids provided via: Cup;Straw Medication Administration: Whole meds with puree Supervision: Staff to assist with self feeding Compensations: Small sips/bites Postural Changes and/or Swallow Maneuvers: Seated upright 90 degrees              General recommendations: Rehab consult Oral Care Recommendations: Oral care BID Plan: Goals updated    GO    Harlon DittyBonnie Mashawn Brazil, MA CCC-SLP [phone]  Claudine MoutonDeBlois, Tiffany Calmes Caroline 09/24/2013, 9:05 AM

## 2013-09-24 NOTE — Progress Notes (Signed)
NUTRITION FOLLOW UP  Intervention:   1.  General healthful diet; encourage intake of foods and beverages as able and as pt is awake and alert.  RD to follow and assess for nutritional adequacy.  2.  Supplements; Resource Breeze po BID, each supplement provides 250 kcal and 9 grams of protein  Nutrition Dx:   Inadequate oral intake related to inability to eat as evidenced by vent, NPO.   Monitor:  1. Enteral nutrition; initiation with tolerance. Pt to meet >/=90% estimated needs with nutrition support.  2. Wt/wt change; monitor trends  Assessment:   Pt admitted after cardiac arrest during HD session. Pt initially required intubation but has since been extubated.  Currently in HD at time of visit.  She did receive TFs while intubated- Nepro @ 20 mL/hr. Her diet has been advanced to CHO Mod Medium with nectar thick liquids.  Pt's intake has been variable due to intermittent lethargy and confusion.  RD to follow.    Height: Ht Readings from Last 1 Encounters:  09/20/13 5\' 2"  (1.575 m)    Weight Status:   Wt Readings from Last 1 Encounters:  09/24/13 184 lb 8.4 oz (83.7 kg)    Re-estimated needs:  Kcal: 1500-1750 Protein: 80-90g Fluid: ~1.2 L/day or per MD  Skin: intact  Diet Order: Carb Control   Intake/Output Summary (Last 24 hours) at 09/24/13 1341 Last data filed at 09/24/13 0800  Gross per 24 hour  Intake    760 ml  Output      0 ml  Net    760 ml    Last BM: 1/12   Labs:   Recent Labs Lab 09/20/13 0914 09/20/13 1155  09/22/13 0500 09/23/13 0409 09/24/13 0450  NA 138 140  < > 139 139 138  K 4.3 4.4  < > 4.8 4.7 4.6  CL 100 98  < > 97 94* 96  CO2  --  21  < > 22 20 21   BUN 44* 41*  < > 55* 31* 44*  CREATININE 7.60* 6.92*  < > 9.66* 5.81* 7.51*  CALCIUM  --  9.0  < > 9.0 9.5 9.3  MG  --  2.4  --   --   --   --   PHOS  --  5.0*  --   --   --   --   GLUCOSE 159* 240*  < > 104* 177* 132*  < > = values in this interval not displayed.  CBG (last 3)    Recent Labs  09/24/13 0417 09/24/13 0803 09/24/13 1144  GLUCAP 147* 142* 166*    Scheduled Meds: . antiseptic oral rinse  15 mL Mouth Rinse QID  . chlorhexidine  15 mL Mouth Rinse BID  . darbepoetin (ARANESP) injection - DIALYSIS  100 mcg Intravenous Q Wed-HD  . doxercalciferol  8 mcg Intravenous Q M,W,F-HD  . enoxaparin (LOVENOX) injection  30 mg Subcutaneous Q24H  . famotidine  20 mg Oral QHS  . [START ON 09/26/2013] levofloxacin  500 mg Oral Q48H  . sevelamer carbonate  1,600 mg Oral TID WC    Continuous Infusions: . sodium chloride 7 mL/hr at 09/22/13 1711    Loyce DysKacie Jakarie Pember, MS RD LDN Clinical Inpatient Dietitian Pager: [phone] Weekend/After hours pager: [phone]

## 2013-09-25 ENCOUNTER — Inpatient Hospital Stay (HOSPITAL_COMMUNITY): Payer: Medicare Other

## 2013-09-25 LAB — GLUCOSE, CAPILLARY
GLUCOSE-CAPILLARY: 91 mg/dL (ref 70–99)
Glucose-Capillary: 137 mg/dL — ABNORMAL HIGH (ref 70–99)

## 2013-09-25 MED ORDER — PENTAFLUOROPROP-TETRAFLUOROETH EX AERO
1.0000 | INHALATION_SPRAY | CUTANEOUS | Status: DC | PRN
Start: 2013-09-25 — End: 2013-09-25

## 2013-09-25 MED ORDER — ALTEPLASE 2 MG IJ SOLR: 2.0000 mg | Freq: Once | INTRAMUSCULAR | Status: DC | PRN

## 2013-09-25 MED ORDER — HEPARIN SODIUM (PORCINE) 1000 UNIT/ML DIALYSIS: 1000.0000 [IU] | INTRAMUSCULAR | Status: DC | PRN

## 2013-09-25 MED ORDER — NEPRO/CARBSTEADY PO LIQD: 237.0000 mL | ORAL | Status: DC | PRN

## 2013-09-25 MED ORDER — LIDOCAINE HCL (PF) 1 % IJ SOLN: 5.0000 mL | INTRAMUSCULAR | Status: DC | PRN

## 2013-09-25 MED ORDER — LIDOCAINE-PRILOCAINE 2.5-2.5 % EX CREA: 1.0000 "application " | TOPICAL_CREAM | CUTANEOUS | Status: DC | PRN

## 2013-09-25 MED ORDER — SODIUM CHLORIDE 0.9 % IV SOLN: 100.0000 mL | INTRAVENOUS | Status: DC | PRN

## 2013-09-25 MED ORDER — PENTAFLUOROPROP-TETRAFLUOROETH EX AERO: 1.0000 "application " | INHALATION_SPRAY | CUTANEOUS | Status: DC | PRN

## 2013-09-25 MED ORDER — HYDRALAZINE HCL 25 MG PO TABS
25.0000 mg | ORAL_TABLET | Freq: Three times a day (TID) | ORAL | Status: DC
Start: 2013-09-25 — End: 2013-10-01
  Administered 2013-09-25 – 2013-09-30 (×14): 25 mg via ORAL
  Filled 2013-09-25 (×24): qty 1

## 2013-09-25 MED ORDER — ACETAMINOPHEN 325 MG PO TABS
650.0000 mg | ORAL_TABLET | ORAL | Status: DC | PRN
  Administered 2013-09-25: 650 mg via ORAL
  Filled 2013-09-25: qty 2

## 2013-09-25 MED ORDER — PENTAFLUOROPROP-TETRAFLUOROETH EX AERO
INHALATION_SPRAY | CUTANEOUS | Status: AC
  Filled 2013-09-25: qty 103.5

## 2013-09-25 MED ORDER — WHITE PETROLATUM GEL
Status: AC
  Administered 2013-09-25: 1
  Filled 2013-09-25: qty 5

## 2013-09-25 MED ORDER — ALBUMIN HUMAN 25 % IV SOLN
INTRAVENOUS | Status: AC
  Filled 2013-09-25: qty 100

## 2013-09-25 NOTE — Procedures (Signed)
Under sterile conditions and using US guidance, a 20 cm Trialysis temporary percutaneous hemodialysis catheter was placed without difficulty. Pt tolerated procedure well.  No complications, access ready to use. Indication was need for dialysis and unable to use AVF do to patient movements.   Vinson Moselleob Shanell Aden MD (pgr) [phone]    ([phone]) [phone] 09/25/2013, 3:55 PM

## 2013-09-25 NOTE — Progress Notes (Addendum)
[facility] Progress Note [name] TEAM 1 - Stepdown/ICU TEAM   Briana Gould ZOX:[id number] DOB: 1946/04/08 DOA: 09/20/2013 PCP: [name], MD  Brief narrative: 68 y/o female with PMH of DM type 2, CAD, severe cerebrovascular disease, right IJ thrombus PAD with b/l BKA, severe carotid disease, Pulm HTN, ESRD on HD and cardiac arrest while on HD  who was discharged on 12/21 after being treated at [facility] for an upper GI bleed in setting of Coumadin use (IJ thrombus).  Readmitted for PEA arrest while on dialysis.  SIGNIFICANT EVENTS / STUDIES:  1/12 cardiac arrest/ PEA at HD. 20 minutes estimated to ROSC  1/12 Echocardiogram: LVEF 50-55%. No RWMAs. Moderate LVH. Probably mild AS. Mild MR. RVSP est 69 mmHg  1/12 CT head: Age uncertain small infarct mid pons. Small infarct in this area may be recent.  1/13 Cognition much improved. Failed SBT  1/15 Extubated successfully   Subjective: Pt alert and appears confused.   Assessment/Plan: Principal Problem:   Cardiac arrest (PEA) - multiple times - etiology not fully determined - question carotid stenosis or severe pulm HTN or sever CAD  which may be contributing when she is becoming hypotensivie during dialysis sessions - Dr Arlean Hopping has asked for interventional neurology consult - considering switch to peritoneal dialysis   Active Problems:   ESRD (end stage renal disease) - per nephrology   Acute respiratory failure with hypoxia/ Pulmonary edema/ diastolic CHF - s/p arrest - nephrology team to manage fluid balance  Severe Pulmonary HTN with moderate TR  CAD  - cath in 2011 revealed diffuse distal disease  Right IJ thrombus - not a candidate for anticoagulation due to PUD with bleed in 12/14  PUD - cont Pepcid  Carotid artery stenosis -  1-39% stenosis involving the left internal carotid artery. - Findings consistent with 60-79% right ICA stenosis - interventional radiology has been consulted     Hypertension - Hydralazine resumed today by nephrology  Acute bronchitis? - per ICU notes, pt had purulent secretions while on vent-     Encephalopathy acute - due to cardiac arrest- may have new cognitive baseline-  - Levaquin started on 1/16- continue for 5 days  Severe PAD with left BKA - family to bring prosthesis in to allow pt to ambulate  Code Status: Full code Family Communication: none Disposition Plan: to be determined  Consultants: Nephrology Cardiology Neurology  Antibiotics: Unasyn 1/12 >> 1/13  Ceftriaxone 1/13 >>    DVT prophylaxis: Lovenox  Objective: Blood pressure 171/60, pulse 81, temperature 99.6 F (37.6 C), temperature source Oral, resp. rate 16, height 5\' 2"  (1.575 m), weight 82 kg (180 lb 12.4 oz), SpO2 94.00%.  Intake/Output Summary (Last 24 hours) at 09/25/13 8119 Last data filed at 09/24/13 2100  Gross per 24 hour  Intake    240 ml  Output    305 ml  Net    -65 ml     Exam: General: AAO x 3, No acute respiratory distress Lungs: Clear to auscultation bilaterally without wheezes or crackles Cardiovascular: Regular rate and rhythm without murmur  Abdomen: Nontender, nondistended, soft, bowel sounds positive, no rebound, no ascites, no appreciable mass Extremities: No significant cyanosis, clubbing, or edema bilateral lower extremities- left BKA  Data Reviewed: Basic Metabolic Panel:  Recent Labs Lab 09/20/13 0914 09/20/13 1155 09/21/13 0620 09/22/13 0500 09/23/13 0409 09/24/13 0450  NA 138 140 142 139 139 138  K 4.3 4.4 4.8 4.8 4.7 4.6  CL 100 98 100 97  94* 96  CO2  --  21 25 22 20 21   GLUCOSE 159* 240* 91 104* 177* 132*  BUN 44* 41* 48* 55* 31* 44*  CREATININE 7.60* 6.92* 8.38* 9.66* 5.81* 7.51*  CALCIUM  --  9.0 8.9 9.0 9.5 9.3  MG  --  2.4  --   --   --   --   PHOS  --  5.0*  --   --   --   --    Liver Function Tests:  Recent Labs Lab 09/20/13 0913 09/20/13 1155 09/22/13 0500  AST 129* 121* 34  ALT 101* 96* 51*   ALKPHOS 105 101 97  BILITOT 0.4 0.6 0.4  PROT 7.0 6.7 6.0  ALBUMIN 3.4* 3.2* 2.7*    Recent Labs Lab 09/20/13 1155  AMYLASE 132*   No results found for this basename: AMMONIA,  in the last 168 hours CBC:  Recent Labs Lab 09/20/13 0913 09/20/13 0914 09/21/13 0620 09/22/13 0500 09/23/13 0409 09/24/13 0450  WBC 10.1  --  8.8 8.7 9.2 9.6  NEUTROABS 8.2*  --   --   --   --   --   HGB 11.2* 12.6 9.5* 9.0* 10.7* 10.2*  HCT 33.5* 37.0 29.5* 27.4* 34.0* 31.0*  MCV 96.3  --  97.0 96.8 98.3 95.7  PLT 92*  --  107* 103* 117* 152   Cardiac Enzymes:  Recent Labs Lab 09/20/13 1155 09/20/13 1707 09/21/13 09/21/13 0621  TROPONINI 0.54* 0.85* 1.49* 1.65*   BNP (last 3 results)  Recent Labs  09/20/13 0913 09/20/13 1155  PROBNP 57457.0* 47172.0*   CBG:  Recent Labs Lab 09/24/13 0803 09/24/13 1144 09/24/13 1823 09/24/13 2140 09/25/13 0714  GLUCAP 142* 166* 83 161* 137*    Recent Results (from the past 240 hour(s))  MRSA PCR SCREENING     Status: None   Collection Time    09/20/13  1:06 PM      Result Value Range Status   MRSA by PCR NEGATIVE  NEGATIVE Final   Comment:            The GeneXpert MRSA Assay (FDA     approved for NASAL specimens     only), is one component of a     comprehensive MRSA colonization     surveillance program. It is not     intended to diagnose MRSA     infection nor to guide or     monitor treatment for     MRSA infections.     Studies:  Recent x-ray studies have been reviewed in detail by the Attending Physician  Scheduled Meds:  Scheduled Meds: . antiseptic oral rinse  15 mL Mouth Rinse QID  . chlorhexidine  15 mL Mouth Rinse BID  . darbepoetin (ARANESP) injection - DIALYSIS  100 mcg Intravenous Q Wed-HD  . doxercalciferol  8 mcg Intravenous Q M,W,F-HD  . enoxaparin (LOVENOX) injection  30 mg Subcutaneous Q24H  . famotidine  20 mg Oral QHS  . [START ON 09/26/2013] levofloxacin  500 mg Oral Q48H  . sevelamer carbonate   1,600 mg Oral TID WC   Continuous Infusions: . sodium chloride 7 mL/hr at 09/22/13 1711    Time spent on care of this patient: >35 min   Calvert Cantor, MD  [facility] Office  [phone] Pager - Text Page per Loretha Stapler as per below:  On-Call/Text Page:      [email or web address]      password [email or web address]  If 7PM-7AM, please contact night-coverage  [email or web address] Password [email or web address] 09/25/2013, 9:18 AM   LOS: 5 days

## 2013-09-25 NOTE — Progress Notes (Signed)
Pt formed clots in the venous chamber and Tx was stopped. Pt able to get most of her blood back. Dr. Arlean HoppingSchertz called and informed of this and wanted pt to run the remaining of her Tx. Line changed and attempted to restart pt. Venous needle was found to have a large clot in it and would not run. Pt restuck and Tx was restarted. Arterial line was repositioned for high pressures and was not able to get it to run without high pressures. Pt dialysis Tx running at a 100 With high AP, jDr. Arlean HoppingSchertz paged.

## 2013-09-25 NOTE — Procedures (Signed)
I was present at this dialysis session, have reviewed the session itself and made  appropriate changes  Vinson Moselleob Jayline Kilburg MD (pgr) [phone]    ([phone]) [phone] 09/25/2013, 10:26 AM

## 2013-09-25 NOTE — Progress Notes (Signed)
[name], female   DOB: [birth year], 68 y.o.   MRN: [id number] Request received from nephrology service for diagnostic cerebral arteriogram on pt with recurrent episodes of PEA , DM, HTN,  CAD,  PVD with prior left BKA, ESRD, and significant intracranial cerebrovascular disease noted on recent MRA. Pt also with hx of rt IJ thrombus and previously on coumadin but subsequently developed GI bleed 08/2013. Additional PMH as below. Exam: pt awake/alert,answers questions appropriately; speech nl, tongue midline, no drift, face symm; strength /sens fxn nl ;chest- CTA bilat ant; heart- RRR; abd-soft,+BS,NT; rt femoral venous cath intact, left BKA. Left arm AVF. Filed Vitals:   09/26/13 0400 09/26/13 0411 09/26/13 0717 09/26/13 0800  BP: 181/71  185/74   Pulse:    92  Temp: 98.7 F (37.1 C)   99 F (37.2 C)  TempSrc: Oral   Oral  Resp: 14     Height:      Weight:  174 lb 2.6 oz (79 kg)    SpO2: 98%   99%   Past Medical History  Diagnosis Date  . End stage renal disease on dialysis   . Peripheral arterial occlusive disease   . Hypertension   . Cardiac arrest   . Diabetes mellitus type 2, uncontrolled, with complications   . DVT (deep venous thrombosis)      Right internal jugular vein  . CAD (coronary artery disease), native coronary artery 2011    Diffuse distal vessel CAD noted at cardiac catheterization   Past Surgical History  Procedure Laterality Date  . Below knee leg amputation Left   . Av fistula placement Left    Ct Head Wo Contrast  09/20/2013   CLINICAL DATA:  Altered mental status ; status post cardiopulmonary resuscitation  EXAM: CT HEAD WITHOUT CONTRAST  TECHNIQUE: Contiguous axial images were obtained from the base of the skull through the vertex without intravenous contrast.  COMPARISON:  None.  FINDINGS: There is age related volume loss. There is no demonstrable mass, hemorrhage, extra-axial fluid collection, or midline shift. There is mild small vessel disease in  the centra semiovale bilaterally. There is decreased attenuation in the midline of the upper to mid pons which may represent a small recent infarct in this area. No other acute appearing infarct is appreciable.  Bony calvarium appears intact.  The mastoid air cells are clear.  IMPRESSION: Age uncertain small infarct mid pons. Small infarct in this area may be recent.  There is underlying age related volume loss with mild periventricular small vessel disease. There is no intracranial mass or hemorrhage. No gray-white compartment edema is appreciable on this study.   Electronically Signed   By: Bretta Bang M.D.   On: 09/20/2013 12:54   Dg Chest Port 1 View  09/25/2013   CLINICAL DATA:  Edema after dialysis.  EXAM: PORTABLE CHEST - 1 VIEW  COMPARISON:  One-view chest 09/24/2013.  FINDINGS: Cardiac enlargement is stable. A left IJ line is stable in position. Interstitial edema is improved. No focal airspace consolidation is evident.  IMPRESSION: 1. Improved interstitial edema. 2. Stable cardiomegaly.   Electronically Signed   By: Gennette Pac M.D.   On: 09/25/2013 21:55   Dg Chest Port 1 View  09/24/2013   CLINICAL DATA:  Respiratory failure  EXAM: PORTABLE CHEST - 1 VIEW  COMPARISON:  09/23/2013  FINDINGS: Interval tracheal and esophageal extubation. Left IJ catheter in stable position, tip at the superior cavoatrial junction.  Unchanged cardiomegaly and pulmonary edema. These  changes could obscure superimposed infection. No evidence of increased pleural fluid. No pneumothorax.  IMPRESSION: 1. Stable lung volumes after tracheal extubation. 2. Unchanged pulmonary edema, which could obscure superimposed consolidation.   Electronically Signed   By: Tiburcio Pea M.D.   On: 09/24/2013 06:23   Dg Chest Port 1 View  09/23/2013   CLINICAL DATA:  Respiratory failure.  EXAM: PORTABLE CHEST - 1 VIEW  COMPARISON:  09/22/2013.  FINDINGS: Endotracheal tube, left IJ line, NG tube in stable position. Persistent  cardiomegaly and pulmonary venous congestion present. Interstitial prominence present. These findings consistent with congestive heart failure. Left base atelectasis and/or infiltrate present. Left pleural effusion has improved. No pneumothorax.  IMPRESSION: 1. Stable line and tube positions. 2. Findings consistent with congestive heart failure with pulmonary interstitial edema again noted. Left pleural effusion has improved. 3. Left lower lobe atelectasis and/or infiltrate.   Electronically Signed   By: Maisie Fus  Register   On: 09/23/2013 07:36   Dg Chest Port 1 View  09/22/2013   CLINICAL DATA:  Respiratory failure.  EXAM: PORTABLE CHEST - 1 VIEW  COMPARISON:  Chest radiograph September 21, 2013  FINDINGS: Endotracheal tube tip projects 4.2 cm above the carina. Left internal jugular central venous catheter with distal tip at the cavoatrial junction. Nasogastric tube past the gastroesophageal junction region code though, tip is not visualized. No pneumothorax.  Stable cardiomegaly, worsening interstitial edema, with minimal patchy airspace opacity in left lung base, unchanged. Suspected small left pleural effusion. Soft tissue planes and included osseous structures are unchanged.  IMPRESSION: No apparent change in life-support lines.  Stable cardiomegaly with slightly worsening interstitial edema. Retrocardiac airspace opacity may reflect atelectasis or confluent edema with suspected small left pleural effusion.   Electronically Signed   By: Awilda Metro   On: 09/22/2013 06:31   Dg Chest Port 1 View  09/21/2013   CLINICAL DATA:  Respiratory distress.  Intubated patient.  EXAM: PORTABLE CHEST - 1 VIEW  COMPARISON:  09/20/2013  FINDINGS: Endotracheal tube tip lies 3 cm above the carina. Nasogastric tube passes below the diaphragm well into the stomach. Left internal jugular central venous line tip lies the caval atrial junction.  Mild hazy airspace opacity is noted in the medial right lung base. This may  reflect atelectasis. Infiltrate is possible. Lungs are otherwise clear. No pleural effusion or pneumothorax  IMPRESSION: 1. Support apparatus is stable in well positioned. 2. Mild hazy opacity in the medial right lung base, likely atelectasis. This potentially could reflect a small area of infiltrate or even asymmetric edema. Lungs are otherwise clear. Findings are similar to the prior exam allowing for differences in patient positioning and technique.   Electronically Signed   By: Amie Portland M.D.   On: 09/21/2013 12:27   Dg Chest Port 1 View  09/20/2013   CLINICAL DATA:  Post IJ placement  EXAM: PORTABLE CHEST - 1 VIEW  COMPARISON:  Prior chest x-ray 09/20/2013 at 9:19 a.m.  FINDINGS: Interval placement of a left IJ approach central venous catheter. The tip of the catheter is in good position at the superior cavoatrial junction. No evidence of complicating pneumothorax. The patient remains intubated. The tip of the endotracheal tube is 4 cm above the carina. A nasogastric tube is present, the tip lies off the field of view presumably within the stomach. External defibrillator pads and metallic artifacts overlie the chest. Stable cardiomegaly, very low inspiratory volumes and mild pulmonary edema.  IMPRESSION: 1. New left IJ approach central venous  catheter with the tip at the superior cavoatrial junction. No evidence of pneumothorax or other complication. 2. Otherwise, no significant interval change in the appearance of the chest.   Electronically Signed   By: Malachy Moan M.D.   On: 09/20/2013 11:48   Dg Chest Portable 1 View  09/20/2013   CLINICAL DATA:  Status post intubation.  EXAM: PORTABLE CHEST - 1 VIEW  COMPARISON:  None.  FINDINGS: 0919 hrs. Endotracheal tube tip is 3.8 cm above the base of the carina. The NG tube passes into the stomach although the distal tip position is not included on the film. Lung volumes are low. The cardio pericardial silhouette is enlarged. Diffuse interstitial  opacity is associated with vascular congestion. Telemetry leads overlie the chest. No evidence for pneumothorax or substantial pleural effusion.  IMPRESSION: Low volume film with enlargement of the cardiopericardial silhouette. Vascular congestion with interstitial opacities suggest associated edema.   Electronically Signed   By: Kennith Center M.D.   On: 09/20/2013 09:30  Results for orders placed during the hospital encounter of 09/20/13  MRSA PCR SCREENING      Result Value Range   MRSA by PCR NEGATIVE  NEGATIVE  CBC WITH DIFFERENTIAL      Result Value Range   WBC 10.1  4.0 - 10.5 K/uL   RBC 3.48 (*) 3.87 - 5.11 MIL/uL   Hemoglobin 11.2 (*) 12.0 - 15.0 g/dL   HCT 16.1 (*) 09.6 - 04.5 %   MCV 96.3  78.0 - 100.0 fL   MCH 32.2  26.0 - 34.0 pg   MCHC 33.4  30.0 - 36.0 g/dL   RDW 40.9 (*) 81.1 - 91.4 %   Platelets 92 (*) 150 - 400 K/uL   Neutrophils Relative % 81 (*) 43 - 77 %   Neutro Abs 8.2 (*) 1.7 - 7.7 K/uL   Lymphocytes Relative 13  12 - 46 %   Lymphs Abs 1.4  0.7 - 4.0 K/uL   Monocytes Relative 3  3 - 12 %   Monocytes Absolute 0.3  0.1 - 1.0 K/uL   Eosinophils Relative 2  0 - 5 %   Eosinophils Absolute 0.2  0.0 - 0.7 K/uL   Basophils Relative 0  0 - 1 %   Basophils Absolute 0.0  0.0 - 0.1 K/uL  PRO B NATRIURETIC PEPTIDE      Result Value Range   Pro B Natriuretic peptide (BNP) 57457.0 (*) 0 - 125 pg/mL  COMPREHENSIVE METABOLIC PANEL      Result Value Range   Sodium 140  137 - 147 mEq/L   Potassium 4.5  3.7 - 5.3 mEq/L   Chloride 94 (*) 96 - 112 mEq/L   CO2 23  19 - 32 mEq/L   Glucose, Bld 153 (*) 70 - 99 mg/dL   BUN 38 (*) 6 - 23 mg/dL   Creatinine, Ser 7.82 (*) 0.50 - 1.10 mg/dL   Calcium 9.4  8.4 - 95.6 mg/dL   Total Protein 7.0  6.0 - 8.3 g/dL   Albumin 3.4 (*) 3.5 - 5.2 g/dL   AST 213 (*) 0 - 37 U/L   ALT 101 (*) 0 - 35 U/L   Alkaline Phosphatase 105  39 - 117 U/L   Total Bilirubin 0.4  0.3 - 1.2 mg/dL   GFR calc non Af Amer 5 (*) >90 mL/min   GFR calc Af Amer 6  (*) >90 mL/min  COMPREHENSIVE METABOLIC PANEL      Result Value  Range   Sodium 140  137 - 147 mEq/L   Potassium 4.4  3.7 - 5.3 mEq/L   Chloride 98  96 - 112 mEq/L   CO2 21  19 - 32 mEq/L   Glucose, Bld 240 (*) 70 - 99 mg/dL   BUN 41 (*) 6 - 23 mg/dL   Creatinine, Ser 1.61 (*) 0.50 - 1.10 mg/dL   Calcium 9.0  8.4 - 09.6 mg/dL   Total Protein 6.7  6.0 - 8.3 g/dL   Albumin 3.2 (*) 3.5 - 5.2 g/dL   AST 045 (*) 0 - 37 U/L   ALT 96 (*) 0 - 35 U/L   Alkaline Phosphatase 101  39 - 117 U/L   Total Bilirubin 0.6  0.3 - 1.2 mg/dL   GFR calc non Af Amer 5 (*) >90 mL/min   GFR calc Af Amer 6 (*) >90 mL/min  MAGNESIUM      Result Value Range   Magnesium 2.4  1.5 - 2.5 mg/dL  PHOSPHORUS      Result Value Range   Phosphorus 5.0 (*) 2.3 - 4.6 mg/dL  AMYLASE      Result Value Range   Amylase 132 (*) 0 - 105 U/L  TROPONIN I      Result Value Range   Troponin I 0.54 (*) <0.30 ng/mL  PROCALCITONIN      Result Value Range   Procalcitonin 0.52    PRO B NATRIURETIC PEPTIDE      Result Value Range   Pro B Natriuretic peptide (BNP) 47172.0 (*) 0 - 125 pg/mL  PROTIME-INR      Result Value Range   Prothrombin Time 14.8  11.6 - 15.2 seconds   INR 1.19  0.00 - 1.49  APTT      Result Value Range   aPTT 39 (*) 24 - 37 seconds  GLUCOSE, CAPILLARY      Result Value Range   Glucose-Capillary 222 (*) 70 - 99 mg/dL  TROPONIN I      Result Value Range   Troponin I 0.85 (*) <0.30 ng/mL  TROPONIN I      Result Value Range   Troponin I 1.49 (*) <0.30 ng/mL  BASIC METABOLIC PANEL      Result Value Range   Sodium 142  137 - 147 mEq/L   Potassium 4.8  3.7 - 5.3 mEq/L   Chloride 100  96 - 112 mEq/L   CO2 25  19 - 32 mEq/L   Glucose, Bld 91  70 - 99 mg/dL   BUN 48 (*) 6 - 23 mg/dL   Creatinine, Ser 4.09 (*) 0.50 - 1.10 mg/dL   Calcium 8.9  8.4 - 81.1 mg/dL   GFR calc non Af Amer 4 (*) >90 mL/min   GFR calc Af Amer 5 (*) >90 mL/min  CBC      Result Value Range   WBC 8.8  4.0 - 10.5 K/uL   RBC  3.04 (*) 3.87 - 5.11 MIL/uL   Hemoglobin 9.5 (*) 12.0 - 15.0 g/dL   HCT 91.4 (*) 78.2 - 95.6 %   MCV 97.0  78.0 - 100.0 fL   MCH 31.3  26.0 - 34.0 pg   MCHC 32.2  30.0 - 36.0 g/dL   RDW 21.3 (*) 08.6 - 57.8 %   Platelets 107 (*) 150 - 400 K/uL  GLUCOSE, CAPILLARY      Result Value Range   Glucose-Capillary 165 (*) 70 - 99 mg/dL  TROPONIN I  Result Value Range   Troponin I 1.65 (*) <0.30 ng/mL  GLUCOSE, CAPILLARY      Result Value Range   Glucose-Capillary 85  70 - 99 mg/dL  GLUCOSE, CAPILLARY      Result Value Range   Glucose-Capillary 106 (*) 70 - 99 mg/dL  GLUCOSE, CAPILLARY      Result Value Range   Glucose-Capillary 103 (*) 70 - 99 mg/dL  GLUCOSE, CAPILLARY      Result Value Range   Glucose-Capillary 83  70 - 99 mg/dL  GLUCOSE, CAPILLARY      Result Value Range   Glucose-Capillary 56 (*) 70 - 99 mg/dL  GLUCOSE, CAPILLARY      Result Value Range   Glucose-Capillary 92  70 - 99 mg/dL   Comment 1 Orig Pt Id entered as [id number]    GLUCOSE, CAPILLARY      Result Value Range   Glucose-Capillary 81  70 - 99 mg/dL  CBC      Result Value Range   WBC 8.7  4.0 - 10.5 K/uL   RBC 2.83 (*) 3.87 - 5.11 MIL/uL   Hemoglobin 9.0 (*) 12.0 - 15.0 g/dL   HCT 40.127.4 (*) 02.736.0 - 25.346.0 %   MCV 96.8  78.0 - 100.0 fL   MCH 31.8  26.0 - 34.0 pg   MCHC 32.8  30.0 - 36.0 g/dL   RDW 66.418.2 (*) 40.311.5 - 47.415.5 %   Platelets 103 (*) 150 - 400 K/uL  COMPREHENSIVE METABOLIC PANEL      Result Value Range   Sodium 139  137 - 147 mEq/L   Potassium 4.8  3.7 - 5.3 mEq/L   Chloride 97  96 - 112 mEq/L   CO2 22  19 - 32 mEq/L   Glucose, Bld 104 (*) 70 - 99 mg/dL   BUN 55 (*) 6 - 23 mg/dL   Creatinine, Ser 2.599.66 (*) 0.50 - 1.10 mg/dL   Calcium 9.0  8.4 - 56.310.5 mg/dL   Total Protein 6.0  6.0 - 8.3 g/dL   Albumin 2.7 (*) 3.5 - 5.2 g/dL   AST 34  0 - 37 U/L   ALT 51 (*) 0 - 35 U/L   Alkaline Phosphatase 97  39 - 117 U/L   Total Bilirubin 0.4  0.3 - 1.2 mg/dL   GFR calc non Af Amer 4 (*) >90 mL/min   GFR  calc Af Amer 4 (*) >90 mL/min  GLUCOSE, CAPILLARY      Result Value Range   Glucose-Capillary 89  70 - 99 mg/dL  GLUCOSE, CAPILLARY      Result Value Range   Glucose-Capillary 90  70 - 99 mg/dL  GLUCOSE, CAPILLARY      Result Value Range   Glucose-Capillary 104 (*) 70 - 99 mg/dL  GLUCOSE, CAPILLARY      Result Value Range   Glucose-Capillary 108 (*) 70 - 99 mg/dL  IRON AND TIBC      Result Value Range   Iron 31 (*) 42 - 135 ug/dL   TIBC 875180 (*) 643250 - 329470 ug/dL   Saturation Ratios 17 (*) 20 - 55 %   UIBC 149  125 - 400 ug/dL  FERRITIN      Result Value Range   Ferritin 1240 (*) 10 - 291 ng/mL  GLUCOSE, CAPILLARY      Result Value Range   Glucose-Capillary 99  70 - 99 mg/dL  BASIC METABOLIC PANEL      Result Value Range  Sodium 139  137 - 147 mEq/L   Potassium 4.7  3.7 - 5.3 mEq/L   Chloride 94 (*) 96 - 112 mEq/L   CO2 20  19 - 32 mEq/L   Glucose, Bld 177 (*) 70 - 99 mg/dL   BUN 31 (*) 6 - 23 mg/dL   Creatinine, Ser 1.61 (*) 0.50 - 1.10 mg/dL   Calcium 9.5  8.4 - 09.6 mg/dL   GFR calc non Af Amer 7 (*) >90 mL/min   GFR calc Af Amer 8 (*) >90 mL/min  CBC      Result Value Range   WBC 9.2  4.0 - 10.5 K/uL   RBC 3.46 (*) 3.87 - 5.11 MIL/uL   Hemoglobin 10.7 (*) 12.0 - 15.0 g/dL   HCT 04.5 (*) 40.9 - 81.1 %   MCV 98.3  78.0 - 100.0 fL   MCH 30.9  26.0 - 34.0 pg   MCHC 31.5  30.0 - 36.0 g/dL   RDW 91.4 (*) 78.2 - 95.6 %   Platelets 117 (*) 150 - 400 K/uL  GLUCOSE, CAPILLARY      Result Value Range   Glucose-Capillary 113 (*) 70 - 99 mg/dL  GLUCOSE, CAPILLARY      Result Value Range   Glucose-Capillary 125 (*) 70 - 99 mg/dL  GLUCOSE, CAPILLARY      Result Value Range   Glucose-Capillary 159 (*) 70 - 99 mg/dL  GLUCOSE, CAPILLARY      Result Value Range   Glucose-Capillary 179 (*) 70 - 99 mg/dL  GLUCOSE, CAPILLARY      Result Value Range   Glucose-Capillary 182 (*) 70 - 99 mg/dL  BASIC METABOLIC PANEL      Result Value Range   Sodium 138  137 - 147 mEq/L    Potassium 4.6  3.7 - 5.3 mEq/L   Chloride 96  96 - 112 mEq/L   CO2 21  19 - 32 mEq/L   Glucose, Bld 132 (*) 70 - 99 mg/dL   BUN 44 (*) 6 - 23 mg/dL   Creatinine, Ser 2.13 (*) 0.50 - 1.10 mg/dL   Calcium 9.3  8.4 - 08.6 mg/dL   GFR calc non Af Amer 5 (*) >90 mL/min   GFR calc Af Amer 6 (*) >90 mL/min  CBC      Result Value Range   WBC 9.6  4.0 - 10.5 K/uL   RBC 3.24 (*) 3.87 - 5.11 MIL/uL   Hemoglobin 10.2 (*) 12.0 - 15.0 g/dL   HCT 57.8 (*) 46.9 - 62.9 %   MCV 95.7  78.0 - 100.0 fL   MCH 31.5  26.0 - 34.0 pg   MCHC 32.9  30.0 - 36.0 g/dL   RDW 52.8 (*) 41.3 - 24.4 %   Platelets 152  150 - 400 K/uL  GLUCOSE, CAPILLARY      Result Value Range   Glucose-Capillary 183 (*) 70 - 99 mg/dL  GLUCOSE, CAPILLARY      Result Value Range   Glucose-Capillary 169 (*) 70 - 99 mg/dL  GLUCOSE, CAPILLARY      Result Value Range   Glucose-Capillary 134 (*) 70 - 99 mg/dL  GLUCOSE, CAPILLARY      Result Value Range   Glucose-Capillary 147 (*) 70 - 99 mg/dL   Comment 1 Documented in Chart     Comment 2 Notify RN    GLUCOSE, CAPILLARY      Result Value Range   Glucose-Capillary 142 (*) 70 - 99 mg/dL  GLUCOSE, CAPILLARY      Result Value Range   Glucose-Capillary 166 (*) 70 - 99 mg/dL  GLUCOSE, CAPILLARY      Result Value Range   Glucose-Capillary 83  70 - 99 mg/dL   Comment 1 Notify RN     Comment 2 Documented in Chart    GLUCOSE, CAPILLARY      Result Value Range   Glucose-Capillary 161 (*) 70 - 99 mg/dL   Comment 1 Notify RN     Comment 2 Documented in Chart    GLUCOSE, CAPILLARY      Result Value Range   Glucose-Capillary 137 (*) 70 - 99 mg/dL  GLUCOSE, CAPILLARY      Result Value Range   Glucose-Capillary 91  70 - 99 mg/dL  GLUCOSE, CAPILLARY      Result Value Range   Glucose-Capillary 114 (*) 70 - 99 mg/dL  POCT I-STAT, CHEM 8      Result Value Range   Sodium 138  137 - 147 mEq/L   Potassium 4.3  3.7 - 5.3 mEq/L   Chloride 100  96 - 112 mEq/L   BUN 44 (*) 6 - 23 mg/dL    Creatinine, Ser 1.61 (*) 0.50 - 1.10 mg/dL   Glucose, Bld 096 (*) 70 - 99 mg/dL   Calcium, Ion 0.45 (*) 1.13 - 1.30 mmol/L   TCO2 27  0 - 100 mmol/L   Hemoglobin 12.6  12.0 - 15.0 g/dL   HCT 40.9  81.1 - 91.4 %  CG4 I-STAT (LACTIC ACID)      Result Value Range   Lactic Acid, Venous 3.18 (*) 0.5 - 2.2 mmol/L  POCT I-STAT TROPONIN I      Result Value Range   Troponin i, poc 0.09 (*) 0.00 - 0.08 ng/mL   Comment NOTIFIED PHYSICIAN     Comment 3           POCT I-STAT 3, BLOOD GAS (G3+)      Result Value Range   pH, Arterial 7.367  7.350 - 7.450   pCO2 arterial 46.2 (*) 35.0 - 45.0 mmHg   pO2, Arterial 281.0 (*) 80.0 - 100.0 mmHg   Bicarbonate 27.1 (*) 20.0 - 24.0 mEq/L   TCO2 29  0 - 100 mmol/L   O2 Saturation 100.0     Acid-Base Excess 1.0  0.0 - 2.0 mmol/L   Patient temperature 35.0 C     Collection site ARTERIAL LINE     Drawn by Operator     Sample type ARTERIAL    POCT I-STAT 3, BLOOD GAS (G3+)      Result Value Range   pH, Arterial 7.398  7.350 - 7.450   pCO2 arterial 43.6  35.0 - 45.0 mmHg   pO2, Arterial 159.0 (*) 80.0 - 100.0 mmHg   Bicarbonate 26.7 (*) 20.0 - 24.0 mEq/L   TCO2 28  0 - 100 mmol/L   O2 Saturation 99.0     Acid-Base Excess 2.0  0.0 - 2.0 mmol/L   Patient temperature 37.9 C     Collection site ARTERIAL LINE     Drawn by Operator     Sample type ARTERIAL    TYPE AND SCREEN      Result Value Range   ABO/RH(D) O POS     Antibody Screen NEG     Sample Expiration 09/23/2013    ABO/RH      Result Value Range   ABO/RH(D) O POS     A/P: Pt  with hx of recurrent episodes of PEA , DM, HTN,  CAD,  PVD with prior left BKA, ESRD, and significant intracranial cerebrovascular disease noted on recent MRA. Pt also with hx of rt IJ thrombus and previously on coumadin but subsequently developed GI bleed 08/2013. Tent plan is for diagnostic cerebral arteriogram on 1/19 to further assess degree of cerebrovascular disease. Details/risks of procedure d/w pt with her  understanding and consent. She would like to discuss plans with Dr. Arlean Hopping before giving final consent. Nurse informed and will have pt sign consent if all parties are in agreement to proceed. Nurse will contact our service with any questions.

## 2013-09-25 NOTE — Progress Notes (Signed)
[facility] Progress Note   Subjective: on SDU now, alert, a little restless, no SOB or cough. CXR yest showing pulm edema  Filed Vitals:   09/25/13 0440 09/25/13 0446 09/25/13 0555 09/25/13 0800  BP: 164/77   171/60  Pulse: 85  79 81  Temp: 98.9 F (37.2 C)   99.6 F (37.6 C)  TempSrc: Oral   Oral  Resp: 18  16 16   Height:      Weight:  82 kg (180 lb 12.4 oz)    SpO2: 93%  95% 94%  Exam Awake, no distress + jvd Chest clear bilat RRR no MRG Abd soft, nt/nd Trace RLE edema, L BKA well healed LUA AVF patent Neuro nonfocal, gen weakness, conversant, awake. ox3  Dialysis: MWF at [facility]  4.5h 79kg BFR 250 (just started buttonhole to AVF) Profile 2 F180 No heparin  Hectorol 8 Epo 20K Venofer none  Assessment/Plan:  1. 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 Known severe bilat intracranial carotid dz.  I have asked Dr Corliss Skainseveshwar to see pt in consultation regarding her cerebrovascular disease. 2. Pulm edema: extra HD today for volume, repeat film after HD  3. ESRD: HD today. For now will do limited UF (2.5kg max) per Rx with extra HD as needed for volume and try to maintain BP over 130-140 during HD 4. Anemia / CKD: on max EPO at center, Hb 12 > 10 here. Resumed esa w darbe 100/wk. Tsat 17%, high ferritin. No IV fe for now.  5. MBD / CKD: adjCa 10, phos 5,  Cont renvela, cont vit D 6. HTN/volume: BP up, will resume home BP meds gradually as needed for goal SBP 140-170, adding hydralazein today. Also volume control w HD today and maybe again tomorrow 7. PAD hx L BKA 8. CAD, hx diffuse "distal" disease on cath 2011   Vinson Moselleob Lenon Kuennen MD pager [phone]    cell [phone] 09/25/2013, 9:15 AM   Recent Labs Lab 09/20/13 0914 09/20/13 1155  09/22/13 0500 09/23/13 0409 09/24/13 0450  NA 138 140  < > 139 139 138  K 4.3 4.4  < > 4.8 4.7 4.6  CL 100 98  < > 97 94* 96  CO2  --  21  < > 22 20 21   GLUCOSE 159* 240*  < > 104* 177* 132*  BUN 44*  41*  < > 55* 31* 44*  CREATININE 7.60* 6.92*  < > 9.66* 5.81* 7.51*  CALCIUM  --  9.0  < > 9.0 9.5 9.3  PHOS  --  5.0*  --   --   --   --   < > = values in this interval not displayed.  Recent Labs Lab 09/20/13 0913 09/20/13 1155 09/22/13 0500  AST 129* 121* 34  ALT 101* 96* 51*  ALKPHOS 105 101 97  BILITOT 0.4 0.6 0.4  PROT 7.0 6.7 6.0  ALBUMIN 3.4* 3.2* 2.7*    Recent Labs Lab 09/20/13 0913  09/22/13 0500 09/23/13 0409 09/24/13 0450  WBC 10.1  < > 8.7 9.2 9.6  NEUTROABS 8.2*  --   --   --   --   HGB 11.2*  < > 9.0* 10.7* 10.2*  HCT 33.5*  < > 27.4* 34.0* 31.0*  MCV 96.3  < > 96.8 98.3 95.7  PLT 92*  < > 103* 117* 152  < > = values in this interval not displayed. Marland Kitchen. antiseptic oral rinse  15 mL Mouth Rinse QID  . chlorhexidine  15 mL Mouth Rinse BID  . darbepoetin (ARANESP) injection - DIALYSIS  100 mcg Intravenous Q Wed-HD  . doxercalciferol  8 mcg Intravenous Q M,W,F-HD  . enoxaparin (LOVENOX) injection  30 mg Subcutaneous Q24H  . famotidine  20 mg Oral QHS  . [START ON 09/26/2013] levofloxacin  500 mg Oral Q48H  . sevelamer carbonate  1,600 mg Oral TID WC   . sodium chloride 7 mL/hr at 09/22/13 1711   sodium chloride, acetaminophen, hydrALAZINE, ondansetron (ZOFRAN) IV, RESOURCE THICKENUP CLEAR

## 2013-09-26 LAB — RENAL FUNCTION PANEL
Albumin: 3.3 g/dL — ABNORMAL LOW (ref 3.5–5.2)
BUN: 21 mg/dL (ref 6–23)
CO2: 27 mEq/L (ref 19–32)
CREATININE: 4.6 mg/dL — AB (ref 0.50–1.10)
Calcium: 9.8 mg/dL (ref 8.4–10.5)
Chloride: 98 mEq/L (ref 96–112)
GFR calc non Af Amer: 9 mL/min — ABNORMAL LOW (ref >90)
GFR, EST AFRICAN AMERICAN: 10 mL/min — AB (ref >90)
Glucose, Bld: 189 mg/dL — ABNORMAL HIGH (ref 70–99)
PHOSPHORUS: 3.3 mg/dL (ref 2.3–4.6)
Potassium: 3.4 mEq/L — ABNORMAL LOW (ref 3.7–5.3)
SODIUM: 143 meq/L (ref 137–147)

## 2013-09-26 LAB — CBC
HCT: 32.1 % — ABNORMAL LOW (ref 36.0–46.0)
Hemoglobin: 10.4 g/dL — ABNORMAL LOW (ref 12.0–15.0)
MCH: 31 pg (ref 26.0–34.0)
MCHC: 32.4 g/dL (ref 30.0–36.0)
MCV: 95.8 fL (ref 78.0–100.0)
Platelets: 213 10*3/uL (ref 150–400)
RBC: 3.35 MIL/uL — AB (ref 3.87–5.11)
RDW: 17.2 % — ABNORMAL HIGH (ref 11.5–15.5)
WBC: 9.2 10*3/uL (ref 4.0–10.5)

## 2013-09-26 LAB — GLUCOSE, CAPILLARY
Glucose-Capillary: 114 mg/dL — ABNORMAL HIGH (ref 70–99)
Glucose-Capillary: 175 mg/dL — ABNORMAL HIGH (ref 70–99)
Glucose-Capillary: 198 mg/dL — ABNORMAL HIGH (ref 70–99)
Glucose-Capillary: 187 mg/dL — ABNORMAL HIGH (ref 70–99)

## 2013-09-26 MED ORDER — SODIUM CHLORIDE 0.9 % IV SOLN: INTRAVENOUS | Status: DC

## 2013-09-26 NOTE — Progress Notes (Addendum)
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  Readmitted for PEA arrest while on dialysis.  SIGNIFICANT EVENTS / STUDIES:  1/12 cardiac arrest/ PEA at HD. 20 minutes estimated to ROSC  1/12 Echocardiogram: LVEF 50-55%. No RWMAs. Moderate LVH. Probably mild AS. Mild MR. RVSP est 69 mmHg  1/12 CT head: Age uncertain small infarct mid pons. Small infarct in this area may be recent.  1/13 Cognition much improved. Failed SBT  1/15 Extubated successfully   Subjective: Pt alert - sister present in room- Pt has no significant complaints- willing to start PT as soon as family brings in prosthesis- agrees with plan for assessing carotid arteries.   Assessment/Plan: Principal Problem:   Cardiac arrest (PEA) - multiple times - etiology not fully determined - question carotid stenosis, severe pulm HTN or severe CAD may be contributing when she is becoming hypotensivie during dialysis sessions - Dr Arlean Hopping has asked for interventional neurology consult to evaluate carotid stenosis - considering switch to peritoneal dialysis   Active Problems:   ESRD (end stage renal disease) - per nephrology   Acute respiratory failure with hypoxia/ Pulmonary edema/ diastolic CHF - s/p arrest - nephrology team to manage fluid balance  Carotid artery stenosis -  1-39% stenosis involving the left internal carotid artery. - Findings consistent with 60-79% right ICA stenosis - interventional radiology has been consulted  Severe Pulmonary HTN with moderate TR  CAD  - cath in  2011 revealed diffuse distal disease  Right IJ thrombus -found on a previous admission-  not a candidate for anticoagulation due to PUD with bleed in 12/14 in setting of Coumadin  PUD - cont Pepcid    Hypertension - Hydralazine resumed by nephrology  Acute bronchitis? - per ICU notes, pt had purulent secretions while on vent-     Encephalopathy acute - due to cardiac arrest- may have new cognitive baseline-  - Levaquin started on 1/16- continue for 5 days  Severe PAD with left BKA - family to bring prosthesis in to allow pt to ambulate  Code Status: Full code Family Communication: with sister Disposition Plan: PT recommending SNF  Consultants: Nephrology Cardiology Neurology  Antibiotics: Unasyn 1/12 >> 1/13  Ceftriaxone 1/13 >>    DVT prophylaxis: Lovenox  Objective: Blood pressure 182/81, pulse 79, temperature 98.4 F (36.9 C), temperature source Oral, resp. rate 18, height 5\' 2"  (1.575 m), weight 79 kg (174 lb 2.6 oz), SpO2 97.00%.  Intake/Output Summary (Last 24 hours) at 09/26/13 1447 Last data filed at 09/25/13 2025  Gross per 24 hour  Intake    240 ml  Output   2138 ml  Net  -1898 ml     Exam: General: AAO x 3, No acute respiratory distress Lungs: Clear to auscultation bilaterally without wheezes or crackles Cardiovascular: Regular rate and rhythm without murmur  Abdomen: Nontender, nondistended, soft, bowel sounds positive, no rebound, no ascites, no appreciable mass Extremities: No significant cyanosis, clubbing, or edema bilateral lower extremities- left BKA  Data Reviewed: Basic Metabolic Panel:  Recent Labs Lab 09/20/13 0914  09/20/13 1155 09/21/13 0620 09/22/13 0500 09/23/13 0409 09/24/13 0450 09/26/13 1337  NA 138 140 142 139 139 138 143  K 4.3 4.4 4.8 4.8 4.7 4.6 3.4*  CL 100 98 100 97 94* 96 98  CO2  --  21 25 22 20 21 27   GLUCOSE 159* 240* 91 104* 177* 132* 189*  BUN 44* 41* 48* 55* 31* 44* 21  CREATININE 7.60* 6.92* 8.38*  9.66* 5.81* 7.51* 4.60*  CALCIUM  --  9.0 8.9 9.0 9.5 9.3 9.8  MG  --  2.4  --   --   --   --   --   PHOS  --  5.0*  --   --   --   --  3.3   Liver Function Tests:  Recent Labs Lab 09/20/13 0913 09/20/13 1155 09/22/13 0500 09/26/13 1337  AST 129* 121* 34  --   ALT 101* 96* 51*  --   ALKPHOS 105 101 97  --   BILITOT 0.4 0.6 0.4  --   PROT 7.0 6.7 6.0  --   ALBUMIN 3.4* 3.2* 2.7* 3.3*    Recent Labs Lab 09/20/13 1155  AMYLASE 132*   No results found for this basename: AMMONIA,  in the last 168 hours CBC:  Recent Labs Lab 09/20/13 0913  09/21/13 0620 09/22/13 0500 09/23/13 0409 09/24/13 0450 09/26/13 1337  WBC 10.1  --  8.8 8.7 9.2 9.6 9.2  NEUTROABS 8.2*  --   --   --   --   --   --   HGB 11.2*  < > 9.5* 9.0* 10.7* 10.2* 10.4*  HCT 33.5*  < > 29.5* 27.4* 34.0* 31.0* 32.1*  MCV 96.3  --  97.0 96.8 98.3 95.7 95.8  PLT 92*  --  107* 103* 117* 152 213  < > = values in this interval not displayed. Cardiac Enzymes:  Recent Labs Lab 09/20/13 1155 09/20/13 1707 09/21/13 09/21/13 0621  TROPONINI 0.54* 0.85* 1.49* 1.65*   BNP (last 3 results)  Recent Labs  09/20/13 0913 09/20/13 1155  PROBNP 57457.0* 47172.0*   CBG:  Recent Labs Lab 09/24/13 2140 09/25/13 0714 09/25/13 1759 09/26/13 0820 09/26/13 1238  GLUCAP 161* 137* 91 114* 175*    Recent Results (from the past 240 hour(s))  MRSA PCR SCREENING     Status: None   Collection Time    09/20/13  1:06 PM      Result Value Range Status   MRSA by PCR NEGATIVE  NEGATIVE Final   Comment:            The GeneXpert MRSA Assay (FDA     approved for NASAL specimens     only), is one component of a     comprehensive MRSA colonization     surveillance program. It is not     intended to diagnose MRSA     infection nor to guide or     monitor treatment for     MRSA infections.     Studies:  Recent x-ray studies have been reviewed in detail by the Attending Physician  Scheduled Meds:  Scheduled  Meds: . antiseptic oral rinse  15 mL Mouth Rinse QID  . chlorhexidine  15 mL Mouth Rinse BID  . darbepoetin (ARANESP) injection - DIALYSIS  100 mcg Intravenous Q Wed-HD  . doxercalciferol  8 mcg Intravenous Q M,W,F-HD  . enoxaparin (LOVENOX) injection  30 mg Subcutaneous Q24H  . famotidine  20 mg Oral QHS  .  hydrALAZINE  25 mg Oral Q8H  . levofloxacin  500 mg Oral Q48H  . sevelamer carbonate  1,600 mg Oral TID WC   Continuous Infusions: . sodium chloride 7 mL/hr at 09/22/13 1711  . sodium chloride      Time spent on care of this patient: >35 min   Briana Gould,Briana Trindade, MD  [facility] Office  [phone] Pager - Text Page per Loretha StaplerAmion as per below:  On-Call/Text Page:      [email or web address]      password [email or web address]  If 7PM-7AM, please contact night-coverage [email or web address] Password [email or web address] 09/26/2013, 2:47 PM   LOS: 6 days

## 2013-09-26 NOTE — Evaluation (Signed)
Physical Therapy Evaluation Patient Details Name: Briana Gould MRN: [id number] DOB: June 26, 1946 Today's Date: 09/26/2013 Time: [id number] PT Time Calculation (min): 20 min  PT Assessment / Plan / Recommendation History of Present Illness  68 year old female w/ ESRD, [facility] after being treated for UGIB while on coumadin for Right IJ thrombus stroke prevention. Was in usual state of health, getting stronger after her d/c. On 1/12 was at regular scheduled HD. About 20 minutes into HD she slumped over and became unresponsive. Initial rhythm was reported as PEA. CPR started. Reported 20 minutes CPR before ROSC, no drugs. Transferred to [facility] s/p arrest.   Clinical Impression  Pt adm due to the above. Presents with new limitations in functional mobility and cognitive deficits. Pt to benefit from skilled acute PT to address deficits listed below, however, after session was complete, information regarding pt having a temp femoral HD catheter was discovered. RN made aware of contradictions of OOB activity, MD notified and safety zone portal filed.  Will need to hold on OOB activities until temp femoral HD catheter is removed.   PT Assessment  Patient needs continued PT services    Follow Up Recommendations  SNF;Supervision/Assistance - 24 hour    Does the patient have the potential to tolerate intense rehabilitation      Barriers to Discharge Decreased caregiver support      Equipment Recommendations  None recommended by PT    Recommendations for Other Services OT consult   Frequency Min 3X/week    Precautions / Restrictions Precautions Precautions: Fall Required Braces or Orthoses: Other Brace/Splint Other Brace/Splint: Lt prosthesis; not in room at this time  Restrictions Weight Bearing Restrictions: No   Pertinent Vitals/Pain VSS      Mobility  Bed Mobility Overal bed mobility: Needs Assistance Bed Mobility: Supine to Sit Supine to sit: Supervision;HOB  elevated General bed mobility comments: HOB elevated; requries cues for sequencing; very unsteady and loses balance sitting EOB frequently Transfers Overall transfer level: Needs assistance Equipment used: 1 person hand held assist Transfers: Sit to/from [employer] CorporationStand;Stand Pivot Transfers Sit to Stand: Mod assist;From elevated surface Stand pivot transfers: Mod assist;From elevated surface General transfer comment: pt attempted to transfer independently and was very unsteady; demo decreased safety awareness and balance; requires mod (A) to achieve transfer safely and max cues for hand placement and sequencing; pt is a fall risk Ambulation/Gait General Gait Details: Lt prosthesis not available today         PT Diagnosis: Difficulty walking;Generalized weakness  PT Problem List: Decreased strength;Decreased balance;Decreased activity tolerance;Decreased mobility;Decreased cognition;Decreased knowledge of use of DME;Decreased safety awareness PT Treatment Interventions: DME instruction;Gait training;Functional mobility training;Therapeutic activities;Therapeutic exercise;Balance training;Neuromuscular re-education;Patient/family education;Wheelchair mobility training     PT Goals(Current goals can be found in the care plan section) Acute Rehab PT Goals Patient Stated Goal: go get some ice chips  PT Goal Formulation: With patient Time For Goal Achievement: 10/10/13 Potential to Achieve Goals: Good  Visit Information  Last PT Received On: 09/26/13 Assistance Needed: +2 (for safety ) History of Present Illness: 68 year old female w/ ESRD, [facility] after being treated for UGIB while on coumadin for Right IJ thrombus stroke prevention. Was in usual state of health, getting stronger after her d/c. On 1/12 was at regular scheduled HD. About 20 minutes into HD she slumped over and became unresponsive. Initial rhythm was reported as PEA. CPR started. Reported 20 minutes CPR before  ROSC, no drugs.  Transferred to [facility] s/p arrest.        Prior Functioning  Home Living Family/patient expects to be discharged to:: Private residence Living Arrangements: Spouse/significant other Available Help at Discharge: Family;Available PRN/intermittently Type of Home: House Home Access: Ramped entrance Home Layout: One level Home Equipment: Walker - 2 wheels;Wheelchair - manual;Shower seat - built in;Bedside commode Additional Comments: husband is disabled and cannot provide physical (A)  Prior Function Level of Independence: Independent Comments: PTA pt and sister reports that pt was independent with mobility and ADLs with Lt prosthesis  Communication Communication: No difficulties    Cognition  Cognition Arousal/Alertness: Awake/alert Behavior During Therapy: Impulsive Overall Cognitive Status: Impaired/Different from baseline Area of Impairment: Orientation;Memory;Following commands;Problem solving;Awareness;Attention;Safety/judgement Orientation Level: Disoriented to;Situation;Time Current Attention Level: Alternating Memory: Decreased short-term memory Following Commands: Follows one step commands with increased time Safety/Judgement: Decreased awareness of deficits;Decreased awareness of safety Awareness: Anticipatory Problem Solving: Slow processing;Difficulty sequencing;Requires verbal cues;Requires tactile cues General Comments: Pt does not recall the most recent episode of cardiac arrest. pt believes she has been in the hospital and in the bed since [place]; sister present to provide history and PLOF; sister reports pt has had decreased memory since admission this time    Extremity/Trunk Assessment Upper Extremity Assessment Upper Extremity Assessment: Defer to OT evaluation Lower Extremity Assessment Lower Extremity Assessment: RLE deficits/detail;LLE deficits/detail RLE Deficits / Details: grossly 4/5 LLE Deficits / Details: previous BKA Cervical / Trunk  Assessment Cervical / Trunk Assessment: Kyphotic   Balance Balance Overall balance assessment: Needs assistance Sitting-balance support: Bilateral upper extremity supported (Rt LE supported ) Sitting balance-Leahy Scale: Fair Sitting balance - Comments: pt very unsteady sitting EOB and off balance; demo multiple LOB to Lt and posteriorly; is correctable without (A) and with max verbal cues; tolerated sitting EOB 7 min Postural control: Posterior lean;Left lateral lean Standing balance support: During functional activity;Single extremity supported Standing balance-Leahy Scale: Zero  End of Session PT - End of Session Equipment Utilized During Treatment: Gait belt Activity Tolerance: Patient tolerated treatment well Patient left: in chair;with call bell/phone within reach;with family/visitor present Nurse Communication: Mobility status;Precautions  GP     Donell Sievert, [place] [phone] 09/26/2013, 1:49 PM

## 2013-09-26 NOTE — Progress Notes (Signed)
Briana Gould   Subjective: 2.1 kg off with HD yest, temp fem cath placed d/t inability to keep AVF arm still   Filed Vitals:   09/26/13 0411 09/26/13 0717 09/26/13 0800 09/26/13 1202  BP:  185/74 176/80 170/86  Pulse:   92   Temp:   99 F (37.2 C)   TempSrc:   Oral   Resp:   14   Height:      Weight: 79 kg (174 lb 2.6 oz)     SpO2:   99%   MRA HEAD FINDINGS 08/14/13 1. Marked high-grade tandem stenosis of the pre cavernous segment of the left internal carotid artery.  2. High-grade stenosis majority of the A1 segment of the left anterior cerebral artery.  3. Moderate stenosis right internal carotid artery pre cavernous and supraclinoid segment  4. Moderate to marked focal stenosis proximal M1 segment right middle cerebral artery.  5. Moderate middle cerebral artery and A2 segment anterior cerebral artery branch vessel narrowing and irregularity.  6. High-grade tandem stenosis right vertebral artery.  7. Non visualized posterior inferior cerebellar artery bilaterally.  8. Nonvisualized right anterior inferior cerebellar artery.  9. Moderate to marked focal narrowing proximal left anterior inferior cerebellar artery.  10. Moderate long segment stenosis proximal to mid left posterior cerebral artery.  11. High-grade stenosis mid to distal right posterior cerebral artery.  12. No aneurysm noted.  Exam Awake, less fidgety today, a little more appropriate Chest clear bilat RRR no MRG Abd soft, nt/nd No LE edema, L BKA well healed LUA AVF patent, R groin HD cath (1/17) Neuro not to baseline yet, marginal memory recent events and judgment off , no focal deficits or asterixis  CXR yest pm after HD > edema improved, mostly resolved  Dialysis: MWF at [facility]  4.5h (4 hr here) 79kg BFR 250 (just started buttonhole to AVF) Profile 2 F180 No heparin  Hectorol 8 Epo 20K Venofer none  Assessment/Plan:  1. 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 Severe ASCVD: see above, Dr Titus Dubinevashwar evaluating, possible cerebral angiogram tomorrow 3. Pulm edema: better 4. ESRD: HD tomorrow. Max UF 2-2.5kg to avoid large BP changes. Use temp fem cath until able to sit still for AVF use. Discussed recommendation for possible switch to PD particularly if nothing can be done to improve cerebrovascular blood flow.  She did not seem interested in considering PD. Not using heparin because of GIB on coumadin in December, can probably resume this week though 5. Anemia / CKD: on max EPO at center, Hb 12 > 10 here. Resumed esa w darbe 100/wk. Tsat 17%, high ferritin. No IV fe for now.  6. MBD / CKD: adjCa 10, phos 5,  Cont renvela, cont vit D 7. HTN/volume: hydralazine resumed 25 tid, at dry weight today. Lower DW 1-2 kg as tolerated, keep SBP over 130 on HD due to #2 8. PAD hx L BKA 9. CAD, hx diffuse "distal" disease on cath 2011   Briana Moselleob Alyssamarie Mounsey MD pager [phone]    cell [phone] 09/26/2013, 12:25 PM   Recent Labs Lab 09/20/13 0914 09/20/13 1155  09/22/13 0500 09/23/13 0409 09/24/13 0450  NA 138 140  < > 139 139 138  K 4.3 4.4  < > 4.8 4.7 4.6  CL 100 98  < > 97 94* 96  CO2  --  21  < > 22 20 21   GLUCOSE 159* 240*  < > 104* 177* 132*  BUN 44* 41*  < >  55* 31* 44*  CREATININE 7.60* 6.92*  < > 9.66* 5.81* 7.51*  CALCIUM  --  9.0  < > 9.0 9.5 9.3  PHOS  --  5.0*  --   --   --   --   < > = values in this interval not displayed.  Recent Labs Lab 09/20/13 0913 09/20/13 1155 09/22/13 0500  AST 129* 121* 34  ALT 101* 96* 51*  ALKPHOS 105 101 97  BILITOT 0.4 0.6 0.4  PROT 7.0 6.7 6.0  ALBUMIN 3.4* 3.2* 2.7*    Recent Labs Lab 09/20/13 0913  09/22/13 0500 09/23/13 0409 09/24/13 0450  WBC 10.1  < > 8.7 9.2 9.6  NEUTROABS 8.2*  --   --   --   --   HGB 11.2*  < > 9.0* 10.7* 10.2*  HCT 33.5*  < > 27.4* 34.0* 31.0*  MCV 96.3  < > 96.8 98.3 95.7  PLT 92*  < > 103* 117* 152  < > = values in this interval not displayed. Marland Kitchen  antiseptic oral rinse  15 mL Mouth Rinse QID  . chlorhexidine  15 mL Mouth Rinse BID  . darbepoetin (ARANESP) injection - DIALYSIS  100 mcg Intravenous Q Wed-HD  . doxercalciferol  8 mcg Intravenous Q M,W,F-HD  . enoxaparin (LOVENOX) injection  30 mg Subcutaneous Q24H  . famotidine  20 mg Oral QHS  . hydrALAZINE  25 mg Oral Q8H  . levofloxacin  500 mg Oral Q48H  . sevelamer carbonate  1,600 mg Oral TID WC   . sodium chloride 7 mL/hr at 09/22/13 1711  . sodium chloride     sodium chloride, acetaminophen, hydrALAZINE, ondansetron (ZOFRAN) IV, RESOURCE THICKENUP CLEAR

## 2013-09-26 NOTE — Progress Notes (Signed)
Attempted to call report. Nurse is busy at this moment.

## 2013-09-27 DIAGNOSIS — Z8719 Personal history of other diseases of the digestive system: Secondary | ICD-10-CM

## 2013-09-27 LAB — BASIC METABOLIC PANEL
BUN: 27 mg/dL — ABNORMAL HIGH (ref 6–23)
BUN: 10 mg/dL (ref 6–23)
CHLORIDE: 102 meq/L (ref 96–112)
CHLORIDE: 101 meq/L (ref 96–112)
CO2: 23 mEq/L (ref 19–32)
CO2: 28 mEq/L (ref 19–32)
Calcium: 9.6 mg/dL (ref 8.4–10.5)
Calcium: 7 mg/dL — ABNORMAL LOW (ref 8.4–10.5)
Creatinine, Ser: 5.61 mg/dL — ABNORMAL HIGH (ref 0.50–1.10)
Creatinine, Ser: 2.28 mg/dL — ABNORMAL HIGH (ref 0.50–1.10)
GFR calc Af Amer: 8 mL/min — ABNORMAL LOW (ref >90)
GFR calc Af Amer: 24 mL/min — ABNORMAL LOW (ref >90)
GFR calc non Af Amer: 7 mL/min — ABNORMAL LOW (ref >90)
GFR, EST NON AFRICAN AMERICAN: 21 mL/min — AB (ref >90)
GLUCOSE: 117 mg/dL — AB (ref 70–99)
GLUCOSE: 124 mg/dL — AB (ref 70–99)
POTASSIUM: 3.6 meq/L — AB (ref 3.7–5.3)
POTASSIUM: 2.6 meq/L — AB (ref 3.7–5.3)
SODIUM: 143 meq/L (ref 137–147)
SODIUM: 139 meq/L (ref 137–147)

## 2013-09-27 LAB — CBC
HEMATOCRIT: 30.3 % — AB (ref 36.0–46.0)
HEMOGLOBIN: 9.6 g/dL — AB (ref 12.0–15.0)
MCH: 30.7 pg (ref 26.0–34.0)
MCHC: 31.7 g/dL (ref 30.0–36.0)
MCV: 96.8 fL (ref 78.0–100.0)
Platelets: 204 10*3/uL (ref 150–400)
RBC: 3.13 MIL/uL — ABNORMAL LOW (ref 3.87–5.11)
RDW: 17.7 % — ABNORMAL HIGH (ref 11.5–15.5)
WBC: 8.7 10*3/uL (ref 4.0–10.5)

## 2013-09-27 LAB — PROTIME-INR
INR: 1.22 (ref 0.00–1.49)
Prothrombin Time: 15.1 seconds (ref 11.6–15.2)

## 2013-09-27 LAB — APTT: aPTT: 35 seconds (ref 24–37)

## 2013-09-27 LAB — GLUCOSE, CAPILLARY
GLUCOSE-CAPILLARY: 121 mg/dL — AB (ref 70–99)
Glucose-Capillary: 160 mg/dL — ABNORMAL HIGH (ref 70–99)

## 2013-09-27 MED ORDER — OXYCODONE-ACETAMINOPHEN 5-325 MG PO TABS: 1.0000 | ORAL_TABLET | Freq: Three times a day (TID) | ORAL | Status: DC | PRN

## 2013-09-27 MED ORDER — ACETAMINOPHEN 500 MG PO TABS
500.0000 mg | ORAL_TABLET | Freq: Three times a day (TID) | ORAL | Status: DC
  Administered 2013-09-27 – 2013-10-14 (×40): 500 mg via ORAL
  Filled 2013-09-27 (×63): qty 1

## 2013-09-27 MED ORDER — TRAMADOL HCL 50 MG PO TABS: 50.0000 mg | ORAL_TABLET | Freq: Four times a day (QID) | ORAL | Status: DC | PRN

## 2013-09-27 MED ORDER — DOXERCALCIFEROL 4 MCG/2ML IV SOLN
INTRAVENOUS | Status: AC
  Administered 2013-09-27: 8 ug via INTRAVENOUS
  Filled 2013-09-27: qty 4

## 2013-09-27 MED ORDER — TRAMADOL HCL 50 MG PO TABS
50.0000 mg | ORAL_TABLET | Freq: Three times a day (TID) | ORAL | Status: DC | PRN
  Administered 2013-09-27: 50 mg via ORAL
  Filled 2013-09-27: qty 1

## 2013-09-27 MED ORDER — POTASSIUM CHLORIDE CRYS ER 20 MEQ PO TBCR
30.0000 meq | EXTENDED_RELEASE_TABLET | ORAL | Status: AC
  Administered 2013-09-27 – 2013-09-28 (×2): 30 meq via ORAL
  Filled 2013-09-27 (×2): qty 1

## 2013-09-27 NOTE — Progress Notes (Signed)
During rounding @ change of shift, Pt was  found on floor betweem bed bed w/z. Moved all extremities and denied hitting her head. Md called as well as her brother Myrtie CruiseMichael Swanson. Continued to monitor pt during the night and no acute deviation noted.

## 2013-09-27 NOTE — Progress Notes (Signed)
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 Readmitted for PEA arrest while on dialysis.   SIGNIFICANT EVENTS / STUDIES:  1/12 cardiac arrest/ PEA at HD. 20 minutes estimated to ROSC  1/12 Echocardiogram: LVEF 50-55%. No RWMAs. Moderate LVH. Probably mild AS. Mild MR. RVSP est 69 mmHg  1/12 CT head: Age uncertain small infarct mid pons. Small infarct in this area may be recent.  1/13 Cognition much improved. Failed SBT  1/15 Extubated successfully    Assessment/Plan:  Principal Problem:   Cardiac arrest (PEA)  - multiple times - etiology not fully determined,  question carotid stenosis, severe pulm HTN or severe CAD may be contributing when she is becoming hypotensivie during dialysis sessions  - Dr. Arlean Hopping has asked for interventional neurology consult to evaluate carotid stenosis, cerebral angiogram was scheduled today but patient refused. I have asked IR cerebral angiogram can be rescheduled to tomorrow morning.   - patient is not interested in PD at this time, d/w Dr.Colodonato, she will need to have a clip process in the dialysis center to be arranged   Chest pain: Musculoskeletal likely due to CPR with chest wall tenderness - Placed her on tramadol as needed and Tylenol 500mg  TID    ESRD (end stage renal disease)  - per nephrology   Acute respiratory failure with hypoxia/ Pulmonary  edema/ diastolic CHF  - s/p arrest  - nephrology team to manage fluid balance   Carotid artery stenosis - 1-39% stenosis involving the left internal carotid artery. - Findings consistent with 60-79% right ICA stenosis  - interventional radiology has been consulted, cerebral angiogram hopefully tomorrow   Severe Pulmonary HTN with moderate TR, CAD  - cath in 2011 revealed diffuse distal disease   Right IJ thrombus  -found on a previous admission- not a candidate for anticoagulation due to PUD with bleed in 12/14 in setting of Coumadin   PUD - cont Pepcid   Hypertension  - Hydralazine resumed by nephrology    Encephalopathy acute - likely due to anoxia secondary to cardiac arrest- may have new cognitive baseline-  - Levaquin started on 1/16- continue for 5 days   Severe PAD with left BKA  - family brought in prosthesis in to allow pt to ambulate   DVT Prophylaxis:  Code Status:  Family Communication: Discussed with patient's brother at the bedside  Disposition: Will likely need SNF     Subjective: Patient seen multiple times today, refuses cerebral angiogram today due to chest pain, musculoskeletal, does somewhat improve with Tylenol.  Objective: Weight change:   Intake/Output Summary (Last 24 hours) at 09/27/13 1221 Last data filed at 09/26/13 1718  Gross per 24 hour  Intake    240 ml  Output      0 ml  Net    240 ml   Blood pressure 166/62, pulse 76,  temperature 98.9 F (37.2 C), temperature source Oral, resp. rate 18, height 5\' 2"  (1.575 m), weight 79 kg (174 lb 2.6 oz), SpO2 98.00%.  Physical Exam: General: Alert and awake, oriented CVS: S1-S2 clear, no murmur rubs or gallops Chest: clear to auscultation bilaterally, no wheezing, rales or rhonchi Abdomen: soft nontender, nondistended, normal bowel sounds  Extremities: no cyanosis, clubbing left BKA,   Lab Results: Basic Metabolic Panel:  Recent Labs Lab 09/26/13 1337 09/27/13 0445  NA 143 143  K  3.4* 3.6*  CL 98 102  CO2 27 23  GLUCOSE 189* 117*  BUN 21 27*  CREATININE 4.60* 5.61*  CALCIUM 9.8 9.6  PHOS 3.3  --    Liver Function Tests:  Recent Labs Lab 09/22/13 0500 09/26/13 1337  AST 34  --   ALT 51*  --   ALKPHOS 97  --   BILITOT 0.4  --   PROT 6.0  --   ALBUMIN 2.7* 3.3*   No results found for this basename: LIPASE, AMYLASE,  in the last 168 hours No results found for this basename: AMMONIA,  in the last 168 hours CBC:  Recent Labs Lab 09/26/13 1337 09/27/13 0445  WBC 9.2 8.7  HGB 10.4* 9.6*  HCT 32.1* 30.3*  MCV 95.8 96.8  PLT 213 204   Cardiac Enzymes:  Recent Labs Lab 09/20/13 1707 09/21/13 09/21/13 0621  TROPONINI 0.85* 1.49* 1.65*   BNP: No components found with this basename: POCBNP,  CBG:  Recent Labs Lab 09/26/13 1238 09/26/13 1618 09/26/13 2043 09/27/13 0749 09/27/13 1137  GLUCAP 175* 198* 187* 121* 160*     Micro Results: Recent Results (from the past 240 hour(s))  MRSA PCR SCREENING     Status: None   Collection Time    09/20/13  1:06 PM      Result Value Range Status   MRSA by PCR NEGATIVE  NEGATIVE Final   Comment:            The GeneXpert MRSA Assay (FDA     approved for NASAL specimens     only), is one component of a     comprehensive MRSA colonization     surveillance program. It is not     intended to diagnose MRSA     infection nor to guide or     monitor treatment for     MRSA infections.    Studies/Results: Ct Head Wo Contrast  09/20/2013   CLINICAL DATA:  Altered mental status ; status post cardiopulmonary resuscitation  EXAM: CT HEAD WITHOUT CONTRAST  TECHNIQUE: Contiguous axial images were obtained from the base of the skull through the vertex without intravenous contrast.  COMPARISON:  None.  FINDINGS: There is age related volume loss. There is no demonstrable mass, hemorrhage, extra-axial fluid collection, or midline shift. There is mild small vessel disease in the centra semiovale bilaterally. There  is decreased attenuation in the midline of the upper to mid pons which may represent a small recent infarct in this area. No other acute appearing infarct is appreciable.  Bony calvarium appears intact.  The mastoid air cells are clear.  IMPRESSION: Age uncertain small infarct mid pons. Small infarct in this area may be recent.  There is underlying age related volume loss with mild periventricular small vessel disease. There is no intracranial mass or hemorrhage. No gray-white compartment edema is appreciable on this study.   Electronically Signed   By: Bretta Bang M.D.   On: 09/20/2013 12:54  Dg Chest Port 1 View  09/25/2013   CLINICAL DATA:  Edema after dialysis.  EXAM: PORTABLE CHEST - 1 VIEW  COMPARISON:  One-view chest 09/24/2013.  FINDINGS: Cardiac enlargement is stable. A left IJ line is stable in position. Interstitial edema is improved. No focal airspace consolidation is evident.  IMPRESSION: 1. Improved interstitial edema. 2. Stable cardiomegaly.   Electronically Signed   By: Gennette Pac M.D.   On: 09/25/2013 21:55   Dg Chest Port 1 View  09/24/2013   CLINICAL DATA:  Respiratory failure  EXAM: PORTABLE CHEST - 1 VIEW  COMPARISON:  09/23/2013  FINDINGS: Interval tracheal and esophageal extubation. Left IJ catheter in stable position, tip at the superior cavoatrial junction.  Unchanged cardiomegaly and pulmonary edema. These changes could obscure superimposed infection. No evidence of increased pleural fluid. No pneumothorax.  IMPRESSION: 1. Stable lung volumes after tracheal extubation. 2. Unchanged pulmonary edema, which could obscure superimposed consolidation.   Electronically Signed   By: Tiburcio Pea M.D.   On: 09/24/2013 06:23   Dg Chest Port 1 View  09/23/2013   CLINICAL DATA:  Respiratory failure.  EXAM: PORTABLE CHEST - 1 VIEW  COMPARISON:  09/22/2013.  FINDINGS: Endotracheal tube, left IJ line, NG tube in stable position. Persistent cardiomegaly and pulmonary venous  congestion present. Interstitial prominence present. These findings consistent with congestive heart failure. Left base atelectasis and/or infiltrate present. Left pleural effusion has improved. No pneumothorax.  IMPRESSION: 1. Stable line and tube positions. 2. Findings consistent with congestive heart failure with pulmonary interstitial edema again noted. Left pleural effusion has improved. 3. Left lower lobe atelectasis and/or infiltrate.   Electronically Signed   By: Maisie Fus  Register   On: 09/23/2013 07:36   Dg Chest Port 1 View  09/22/2013   CLINICAL DATA:  Respiratory failure.  EXAM: PORTABLE CHEST - 1 VIEW  COMPARISON:  Chest radiograph September 21, 2013  FINDINGS: Endotracheal tube tip projects 4.2 cm above the carina. Left internal jugular central venous catheter with distal tip at the cavoatrial junction. Nasogastric tube past the gastroesophageal junction region code though, tip is not visualized. No pneumothorax.  Stable cardiomegaly, worsening interstitial edema, with minimal patchy airspace opacity in left lung base, unchanged. Suspected small left pleural effusion. Soft tissue planes and included osseous structures are unchanged.  IMPRESSION: No apparent change in life-support lines.  Stable cardiomegaly with slightly worsening interstitial edema. Retrocardiac airspace opacity may reflect atelectasis or confluent edema with suspected small left pleural effusion.   Electronically Signed   By: Awilda Metro   On: 09/22/2013 06:31   Dg Chest Port 1 View  09/21/2013   CLINICAL DATA:  Respiratory distress.  Intubated patient.  EXAM: PORTABLE CHEST - 1 VIEW  COMPARISON:  09/20/2013  FINDINGS: Endotracheal tube tip lies 3 cm above the carina. Nasogastric tube passes below the diaphragm well into the stomach. Left internal jugular central venous line tip lies the caval atrial junction.  Mild hazy airspace opacity is noted in the medial right lung base. This may reflect atelectasis. Infiltrate is  possible. Lungs are otherwise clear. No pleural effusion or pneumothorax  IMPRESSION: 1. Support apparatus is stable in well positioned. 2. Mild hazy opacity in the medial right lung base, likely atelectasis. This potentially could reflect a small area of infiltrate or even asymmetric edema. Lungs are otherwise clear. Findings are similar to the prior exam allowing for differences in patient positioning and technique.   Electronically Signed   By: Renard Hamper.D.  On: 09/21/2013 12:27   Dg Chest Port 1 View  09/20/2013   CLINICAL DATA:  Post IJ placement  EXAM: PORTABLE CHEST - 1 VIEW  COMPARISON:  Prior chest x-ray 09/20/2013 at 9:19 a.m.  FINDINGS: Interval placement of a left IJ approach central venous catheter. The tip of the catheter is in good position at the superior cavoatrial junction. No evidence of complicating pneumothorax. The patient remains intubated. The tip of the endotracheal tube is 4 cm above the carina. A nasogastric tube is present, the tip lies off the field of view presumably within the stomach. External defibrillator pads and metallic artifacts overlie the chest. Stable cardiomegaly, very low inspiratory volumes and mild pulmonary edema.  IMPRESSION: 1. New left IJ approach central venous catheter with the tip at the superior cavoatrial junction. No evidence of pneumothorax or other complication. 2. Otherwise, no significant interval change in the appearance of the chest.   Electronically Signed   By: Malachy MoanHeath  McCullough M.D.   On: 09/20/2013 11:48   Dg Chest Portable 1 View  09/20/2013   CLINICAL DATA:  Status post intubation.  EXAM: PORTABLE CHEST - 1 VIEW  COMPARISON:  None.  FINDINGS: 0919 hrs. Endotracheal tube tip is 3.8 cm above the base of the carina. The NG tube passes into the stomach although the distal tip position is not included on the film. Lung volumes are low. The cardio pericardial silhouette is enlarged. Diffuse interstitial opacity is associated with vascular  congestion. Telemetry leads overlie the chest. No evidence for pneumothorax or substantial pleural effusion.  IMPRESSION: Low volume film with enlargement of the cardiopericardial silhouette. Vascular congestion with interstitial opacities suggest associated edema.   Electronically Signed   By: Kennith CenterEric  Mansell M.D.   On: 09/20/2013 09:30    Medications: Scheduled Meds: . antiseptic oral rinse  15 mL Mouth Rinse QID  . chlorhexidine  15 mL Mouth Rinse BID  . darbepoetin (ARANESP) injection - DIALYSIS  100 mcg Intravenous Q Wed-HD  . doxercalciferol  8 mcg Intravenous Q M,W,F-HD  . enoxaparin (LOVENOX) injection  30 mg Subcutaneous Q24H  . famotidine  20 mg Oral QHS  . hydrALAZINE  25 mg Oral Q8H  . levofloxacin  500 mg Oral Q48H  . sevelamer carbonate  1,600 mg Oral TID WC      LOS: 7 days   Shelbey Spindler M.D. [facility] 09/27/2013, 12:21 PM Pager: [phone]  If 7PM-7AM, please contact night-coverage [email or web address] Password [email or web address]

## 2013-09-27 NOTE — Progress Notes (Signed)
Pt for cerebral angiogram today. She is reporting chest discomfort this morning and wants to hold off of procedure at least another 1-2 days. Dw attending Dr. Isidoro Donningai who feels chest related to CPR and has discussed with pt, but she still declines procedure today. Will make NPO p MN and tentatively plan for procedure tomorrow.  Brayton ElKevin Suheyb Raucci PA-C Interventional Radiology 09/27/2013 9:34 AM

## 2013-09-27 NOTE — Progress Notes (Signed)
[name], female   DOB: 30-Mar-1946, 68 y.o.   MRN: [id number]  [facility] Progress Note    Subjective:   Pt more awake/alert today, no complaints.  Doesn't remember talking to Dr. Arlean Hopping yesterday about PD   Objective:   BP 166/62  Pulse 76  Temp(Src) 98.9 F (37.2 C) (Oral)  Resp 18  Ht 5\' 2"  (1.575 m)  Wt 79 kg (174 lb 2.6 oz)  BMI 31.85 kg/m2  SpO2 98%  Intake/Output: I/O last 3 completed shifts: In: 480 [P.O.:480] Out: 0    Intake/Output this shift:    Weight change:   Physical Exam: Gen:WD WN AAF in NAD CVS:no rub Resp:cta WUJ:[id number] Ext:s/p left BKA, LUE AVF +T/B, right fem temp HD cath, LIJ TLC  Labs: BMET  Recent Labs Lab 09/20/13 1155 09/21/13 0620 09/22/13 0500 09/23/13 0409 09/24/13 0450 09/26/13 1337 09/27/13 0445  NA 140 142 139 139 138 143 143  K 4.4 4.8 4.8 4.7 4.6 3.4* 3.6*  CL 98 100 97 94* 96 98 102  CO2 21 25 22 20 21 27 23   GLUCOSE 240* 91 104* 177* 132* 189* 117*  BUN 41* 48* 55* 31* 44* 21 27*  CREATININE 6.92* 8.38* 9.66* 5.81* 7.51* 4.60* 5.61*  ALBUMIN 3.2*  --  2.7*  --   --  3.3*  --   CALCIUM 9.0 8.9 9.0 9.5 9.3 9.8 9.6  PHOS 5.0*  --   --   --   --  3.3  --    CBC  Recent Labs Lab 09/23/13 0409 09/24/13 0450 09/26/13 1337 09/27/13 0445  WBC 9.2 9.6 9.2 8.7  HGB 10.7* 10.2* 10.4* 9.6*  HCT 34.0* 31.0* 32.1* 30.3*  MCV 98.3 95.7 95.8 96.8  PLT 117* 152 213 204    @IMGRELPRIORS @ Medications:    . antiseptic oral rinse  15 mL Mouth Rinse QID  . chlorhexidine  15 mL Mouth Rinse BID  . darbepoetin (ARANESP) injection - DIALYSIS  100 mcg Intravenous Q Wed-HD  . doxercalciferol  8 mcg Intravenous Q M,W,F-HD  . enoxaparin (LOVENOX) injection  30 mg Subcutaneous Q24H  . famotidine  20 mg Oral QHS  . hydrALAZINE  25 mg Oral Q8H  . levofloxacin  500 mg Oral Q48H  . sevelamer carbonate  1,600 mg Oral TID WC     Dialysis: MWF at [facility]  4.5h (4 hr here) 79kg BFR 250 (just started  buttonhole to AVF) Profile 2 F180 No heparin  Hectorol 8 Epo 20K Venofer none   Assessment/Plan:  1. 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  Cont to be major issue and alternatives have been presented but not interested in PD at this time.  Unsure if this is related to cerebrovascular disease (which is diffuse and severe), severe Pulm HTN, or CAD.  Difficult situation as she wants to wait another day for cerebral angio and understands the benefits of CCPD but is not interested right now.   2. Severe ASCVD: see above, Dr Titus Dubin evaluating, possible cerebral angiogram tomorrow (pt declines today) 3. Pulm edema: better 4. ESRD: HD today. Max UF 2-2.5kg to avoid large BP changes. Use temp fem cath until able to sit still for AVF use. Discussed recommendation for possible switch to PD particularly if nothing can be done to improve cerebrovascular blood flow. She did not seem interested in considering PD. Not using heparin because of GIB on coumadin in December, can probably resume this week though 5. Anemia / CKD: on max  EPO at center, Hb 12 > 10 here. Resumed esa w darbe 100/wk. Tsat 17%, high ferritin. No IV fe for now.  6. MBD / CKD: adjCa 10, phos 5, Cont renvela, cont vit D 7. HTN/volume: hydralazine resumed 25 tid, at dry weight today. Lower DW 1-2 kg as tolerated, keep SBP over 130 on HD due to #2 8. PAD hx L BKA 9. CAD, hx diffuse "distal" disease on cath 2011 10. Dispo- will likely require SNF since she is not interested in PD  Briana Gould A 09/27/2013, 9:50 AM

## 2013-09-27 NOTE — Progress Notes (Signed)
UR Completed Deja Kaigler Graves-Bigelow, RN,BSN [phone]  

## 2013-09-28 ENCOUNTER — Inpatient Hospital Stay (HOSPITAL_COMMUNITY): Payer: Medicare Other

## 2013-09-28 LAB — GLUCOSE, CAPILLARY
Glucose-Capillary: 129 mg/dL — ABNORMAL HIGH (ref 70–99)
Glucose-Capillary: 140 mg/dL — ABNORMAL HIGH (ref 70–99)
Glucose-Capillary: 157 mg/dL — ABNORMAL HIGH (ref 70–99)
Glucose-Capillary: 435 mg/dL — ABNORMAL HIGH (ref 70–99)

## 2013-09-28 LAB — BASIC METABOLIC PANEL
BUN: 15 mg/dL (ref 6–23)
CO2: 27 mEq/L (ref 19–32)
Calcium: 9.6 mg/dL (ref 8.4–10.5)
Chloride: 101 mEq/L (ref 96–112)
Creatinine, Ser: 3.87 mg/dL — ABNORMAL HIGH (ref 0.50–1.10)
GFR calc Af Amer: 13 mL/min — ABNORMAL LOW (ref >90)
GFR calc non Af Amer: 11 mL/min — ABNORMAL LOW (ref >90)
Glucose, Bld: 148 mg/dL — ABNORMAL HIGH (ref 70–99)
Potassium: 4.7 mEq/L (ref 3.7–5.3)
Sodium: 142 mEq/L (ref 137–147)

## 2013-09-28 LAB — HEMOGLOBIN AND HEMATOCRIT, BLOOD
HCT: 28.9 % — ABNORMAL LOW (ref 36.0–46.0)
Hemoglobin: 9.2 g/dL — ABNORMAL LOW (ref 12.0–15.0)

## 2013-09-28 MED ORDER — HEPARIN SOD (PORK) LOCK FLUSH 100 UNIT/ML IV SOLN
INTRAVENOUS | Status: AC | PRN
  Administered 2013-09-28: 1000 [IU] via INTRAVENOUS

## 2013-09-28 MED ORDER — SODIUM CHLORIDE 0.9 % IV SOLN
INTRAVENOUS | Status: AC | PRN
  Administered 2013-09-28: 30 mL/h via INTRAVENOUS

## 2013-09-28 MED ORDER — IOHEXOL 300 MG/ML  SOLN
150.0000 mL | Freq: Once | INTRAMUSCULAR | Status: AC | PRN
  Administered 2013-09-28: 80 mL via INTRA_ARTERIAL

## 2013-09-28 MED ORDER — MIDAZOLAM HCL 2 MG/2ML IJ SOLN
INTRAMUSCULAR | Status: AC
  Filled 2013-09-28: qty 2

## 2013-09-28 MED ORDER — FENTANYL CITRATE 0.05 MG/ML IJ SOLN
INTRAMUSCULAR | Status: AC
Start: 2013-09-28 — End: 2013-09-28
  Filled 2013-09-28: qty 2

## 2013-09-28 MED ORDER — FENTANYL CITRATE 0.05 MG/ML IJ SOLN
INTRAMUSCULAR | Status: AC | PRN
  Administered 2013-09-28 (×2): 12.5 ug via INTRAVENOUS

## 2013-09-28 MED ORDER — MIDAZOLAM HCL 2 MG/2ML IJ SOLN
INTRAMUSCULAR | Status: AC | PRN
  Administered 2013-09-28 (×2): 0.5 mg via INTRAVENOUS

## 2013-09-28 NOTE — ED Notes (Signed)
[facility]  Pressure being held, no closure device.

## 2013-09-28 NOTE — Progress Notes (Signed)
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 Readmitted for PEA arrest while on dialysis.   SIGNIFICANT EVENTS / STUDIES:  1/12 cardiac arrest/ PEA at HD. 20 minutes estimated to ROSC  1/12 Echocardiogram: LVEF 50-55%. No RWMAs. Moderate LVH. Probably mild AS. Mild MR. RVSP est 69 mmHg  1/12 CT head: Age uncertain small infarct mid pons. Small infarct in this area may be recent.  1/13 Cognition much improved. Failed SBT  1/15 Extubated successfully  1/20: Cerebral angiogram done   Assessment/Plan:  Principal Problem:   Cardiac arrest (PEA)  - multiple times - etiology not fully determined,  question carotid stenosis, severe pulm HTN or severe CAD may be contributing when she is becoming hypotensivie during dialysis sessions  - Dr. Arlean Hopping has asked for interventional neurology consult to evaluate carotid stenosis, cerebral angiogram was scheduled today but patient refused. I have asked IR cerebral angiogram can be rescheduled to tomorrow morning.   - patient is not interested in PD at this time, d/w Dr.Colodonato, plan to have hemodialysis done tomorrow and cont further at SNF MWF.   Chest pain: Musculoskeletal likely due to CPR with chest wall tenderness improving:  - Placed her on tramadol as needed and Tylenol 500mg  TID   ESRD (end stage renal disease)  - per nephrology   Acute respiratory  failure with hypoxia/ Pulmonary edema/ diastolic CHF  - s/p arrest  - nephrology team to manage fluid balance   Carotid artery stenosis - 1-39% stenosis involving the left internal carotid artery. - Findings consistent with 60-79% right ICA stenosis  - interventional radiology has been consulted, cerebral angiogram done today by Dr. Corliss Skains.  Findings. Severe stenosis of LT ICA pet cavernous junction of 95% , 60 % stenosis of RT ICA prox.. 50 % steosis of LT ICA petrous seg. Occluded RT VBJ   Severe Pulmonary HTN with moderate TR, CAD  - cath in 2011 revealed diffuse distal disease   Right IJ thrombus  -found on a previous admission- not a candidate for anticoagulation due to PUD with bleed in 12/14 in setting of Coumadin   PUD - cont Pepcid   Hypertension  - Hydralazine resumed by nephrology   Encephalopathy acute - likely due to anoxia secondary to cardiac arrest- may have new cognitive baseline-  - Levaquin started on 1/16- continue for 5 days   Severe PAD with left BKA  - family brought in prosthesis in to allow pt to ambulate   DVT Prophylaxis:  Code Status: Full code  Family Communication:   Disposition:  PT recommended skilled nursing facility, supervision 24 hours. Patient currently has her prosthesis, will recheck PT evaluation    Subjective: Patient seen multiple times today, refuses cerebral angiogram today due to chest pain, musculoskeletal, does somewhat improve with Tylenol.  Objective: Weight change:   Intake/Output Summary (Last 24 hours)  at 09/28/13 1412 Last data filed at 09/27/13 1940  Gross per 24 hour  Intake      0 ml  Output   1306 ml  Net  -1306 ml   Blood pressure 128/93, pulse 89, temperature 99.2 F (37.3 C), temperature source Oral, resp. rate 16, height 5\' 2"  (1.575 m), weight 79.198 kg (174 lb 9.6 oz), SpO2 98.00%.  Physical Exam: General: Alert and awake, oriented CVS: S1-S2 clear Chest: CTAB Abdomen: soft nontender,  nondistended, normal bowel sounds  Extremities: no cyanosis, clubbing left BKA,   Lab Results: Basic Metabolic Panel:  Recent Labs Lab 09/26/13 1337  09/27/13 1500 09/28/13 0650  NA 143  < > 139 142  K 3.4*  < > 2.6* 4.7  CL 98  < > 101 101  CO2 27  < > 28 27  GLUCOSE 189*  < > 124* 148*  BUN 21  < > 10 15  CREATININE 4.60*  < > 2.28* 3.87*  CALCIUM 9.8  < > 7.0* 9.6  PHOS 3.3  --   --   --   < > = values in this interval not displayed. Liver Function Tests:  Recent Labs Lab 09/22/13 0500 09/26/13 1337  AST 34  --   ALT 51*  --   ALKPHOS 97  --   BILITOT 0.4  --   PROT 6.0  --   ALBUMIN 2.7* 3.3*   No results found for this basename: LIPASE, AMYLASE,  in the last 168 hours No results found for this basename: AMMONIA,  in the last 168 hours CBC:  Recent Labs Lab 09/26/13 1337 09/27/13 0445  WBC 9.2 8.7  HGB 10.4* 9.6*  HCT 32.1* 30.3*  MCV 95.8 96.8  PLT 213 204   Cardiac Enzymes: No results found for this basename: CKTOTAL, CKMB, CKMBINDEX, TROPONINI,  in the last 168 hours BNP: No components found with this basename: POCBNP,  CBG:  Recent Labs Lab 09/26/13 1618 09/26/13 2043 09/27/13 0749 09/27/13 1137 09/28/13 1216  GLUCAP 198* 187* 121* 160* 129*     Micro Results: Recent Results (from the past 240 hour(s))  MRSA PCR SCREENING     Status: None   Collection Time    09/20/13  1:06 PM      Result Value Range Status   MRSA by PCR NEGATIVE  NEGATIVE Final   Comment:            The GeneXpert MRSA Assay (FDA     approved for NASAL specimens     only), is one component of a     comprehensive MRSA colonization     surveillance program. It is not     intended to diagnose MRSA     infection nor to guide or     monitor treatment for     MRSA infections.    Studies/Results: Ct Head Wo Contrast  09/20/2013   CLINICAL DATA:  Altered mental status ; status post cardiopulmonary resuscitation  EXAM: CT HEAD WITHOUT CONTRAST  TECHNIQUE: Contiguous  axial images were obtained from the base of the skull through the vertex without intravenous contrast.  COMPARISON:  None.  FINDINGS: There is age related volume loss. There is no demonstrable mass, hemorrhage, extra-axial fluid collection, or midline shift. There is mild small vessel disease in the centra semiovale bilaterally. There is decreased attenuation in the midline of the upper to mid pons which may represent a small recent infarct in this area. No other acute appearing infarct is  appreciable.  Bony calvarium appears intact.  The mastoid air cells are clear.  IMPRESSION: Age uncertain small infarct mid pons. Small infarct in this area may be recent.  There is underlying age related volume loss with mild periventricular small vessel disease. There is no intracranial mass or hemorrhage. No gray-white compartment edema is appreciable on this study.   Electronically Signed   By: Bretta Bang M.D.   On: 09/20/2013 12:54   Dg Chest Port 1 View  09/25/2013   CLINICAL DATA:  Edema after dialysis.  EXAM: PORTABLE CHEST - 1 VIEW  COMPARISON:  One-view chest 09/24/2013.  FINDINGS: Cardiac enlargement is stable. A left IJ line is stable in position. Interstitial edema is improved. No focal airspace consolidation is evident.  IMPRESSION: 1. Improved interstitial edema. 2. Stable cardiomegaly.   Electronically Signed   By: Gennette Pac M.D.   On: 09/25/2013 21:55   Dg Chest Port 1 View  09/24/2013   CLINICAL DATA:  Respiratory failure  EXAM: PORTABLE CHEST - 1 VIEW  COMPARISON:  09/23/2013  FINDINGS: Interval tracheal and esophageal extubation. Left IJ catheter in stable position, tip at the superior cavoatrial junction.  Unchanged cardiomegaly and pulmonary edema. These changes could obscure superimposed infection. No evidence of increased pleural fluid. No pneumothorax.  IMPRESSION: 1. Stable lung volumes after tracheal extubation. 2. Unchanged pulmonary edema, which could obscure superimposed  consolidation.   Electronically Signed   By: Tiburcio Pea M.D.   On: 09/24/2013 06:23   Dg Chest Port 1 View  09/23/2013   CLINICAL DATA:  Respiratory failure.  EXAM: PORTABLE CHEST - 1 VIEW  COMPARISON:  09/22/2013.  FINDINGS: Endotracheal tube, left IJ line, NG tube in stable position. Persistent cardiomegaly and pulmonary venous congestion present. Interstitial prominence present. These findings consistent with congestive heart failure. Left base atelectasis and/or infiltrate present. Left pleural effusion has improved. No pneumothorax.  IMPRESSION: 1. Stable line and tube positions. 2. Findings consistent with congestive heart failure with pulmonary interstitial edema again noted. Left pleural effusion has improved. 3. Left lower lobe atelectasis and/or infiltrate.   Electronically Signed   By: Maisie Fus  Register   On: 09/23/2013 07:36   Dg Chest Port 1 View  09/22/2013   CLINICAL DATA:  Respiratory failure.  EXAM: PORTABLE CHEST - 1 VIEW  COMPARISON:  Chest radiograph September 21, 2013  FINDINGS: Endotracheal tube tip projects 4.2 cm above the carina. Left internal jugular central venous catheter with distal tip at the cavoatrial junction. Nasogastric tube past the gastroesophageal junction region code though, tip is not visualized. No pneumothorax.  Stable cardiomegaly, worsening interstitial edema, with minimal patchy airspace opacity in left lung base, unchanged. Suspected small left pleural effusion. Soft tissue planes and included osseous structures are unchanged.  IMPRESSION: No apparent change in life-support lines.  Stable cardiomegaly with slightly worsening interstitial edema. Retrocardiac airspace opacity may reflect atelectasis or confluent edema with suspected small left pleural effusion.   Electronically Signed   By: Awilda Metro   On: 09/22/2013 06:31   Dg Chest Port 1 View  09/21/2013   CLINICAL DATA:  Respiratory distress.  Intubated patient.  EXAM: PORTABLE CHEST - 1 VIEW   COMPARISON:  09/20/2013  FINDINGS: Endotracheal tube tip lies 3 cm above the carina. Nasogastric tube passes below the diaphragm well into the stomach. Left internal jugular central venous line tip lies the caval atrial junction.  Mild hazy airspace opacity is noted in the medial right lung base. This may reflect atelectasis. Infiltrate  is possible. Lungs are otherwise clear. No pleural effusion or pneumothorax  IMPRESSION: 1. Support apparatus is stable in well positioned. 2. Mild hazy opacity in the medial right lung base, likely atelectasis. This potentially could reflect a small area of infiltrate or even asymmetric edema. Lungs are otherwise clear. Findings are similar to the prior exam allowing for differences in patient positioning and technique.   Electronically Signed   By: Amie Portland M.D.   On: 09/21/2013 12:27   Dg Chest Port 1 View  09/20/2013   CLINICAL DATA:  Post IJ placement  EXAM: PORTABLE CHEST - 1 VIEW  COMPARISON:  Prior chest x-ray 09/20/2013 at 9:19 a.m.  FINDINGS: Interval placement of a left IJ approach central venous catheter. The tip of the catheter is in good position at the superior cavoatrial junction. No evidence of complicating pneumothorax. The patient remains intubated. The tip of the endotracheal tube is 4 cm above the carina. A nasogastric tube is present, the tip lies off the field of view presumably within the stomach. External defibrillator pads and metallic artifacts overlie the chest. Stable cardiomegaly, very low inspiratory volumes and mild pulmonary edema.  IMPRESSION: 1. New left IJ approach central venous catheter with the tip at the superior cavoatrial junction. No evidence of pneumothorax or other complication. 2. Otherwise, no significant interval change in the appearance of the chest.   Electronically Signed   By: Malachy Moan M.D.   On: 09/20/2013 11:48   Dg Chest Portable 1 View  09/20/2013   CLINICAL DATA:  Status post intubation.  EXAM: PORTABLE  CHEST - 1 VIEW  COMPARISON:  None.  FINDINGS: 0919 hrs. Endotracheal tube tip is 3.8 cm above the base of the carina. The NG tube passes into the stomach although the distal tip position is not included on the film. Lung volumes are low. The cardio pericardial silhouette is enlarged. Diffuse interstitial opacity is associated with vascular congestion. Telemetry leads overlie the chest. No evidence for pneumothorax or substantial pleural effusion.  IMPRESSION: Low volume film with enlargement of the cardiopericardial silhouette. Vascular congestion with interstitial opacities suggest associated edema.   Electronically Signed   By: Kennith Center M.D.   On: 09/20/2013 09:30    Medications: Scheduled Meds: . acetaminophen  500 mg Oral TID  . antiseptic oral rinse  15 mL Mouth Rinse QID  . chlorhexidine  15 mL Mouth Rinse BID  . darbepoetin (ARANESP) injection - DIALYSIS  100 mcg Intravenous Q Wed-HD  . doxercalciferol  8 mcg Intravenous Q M,W,F-HD  . enoxaparin (LOVENOX) injection  30 mg Subcutaneous Q24H  . famotidine  20 mg Oral QHS  . fentaNYL      . hydrALAZINE  25 mg Oral Q8H  . midazolam      . sevelamer carbonate  1,600 mg Oral TID WC      LOS: 8 days   Timathy Newberry M.D. [facility] 09/28/2013, 2:12 PM Pager: [phone]  If 7PM-7AM, please contact night-coverage [email or web address] Password [email or web address]

## 2013-09-28 NOTE — ED Notes (Signed)
o2 d/c'd 

## 2013-09-28 NOTE — ED Notes (Signed)
Still holding pressure

## 2013-09-28 NOTE — ED Notes (Signed)
O2 2l/[place] started 

## 2013-09-28 NOTE — Progress Notes (Signed)
[name], female   DOB: 10/07/1945, 68 y.o.   MRN: [id number]  [facility] Progress Note    Subjective:   Feels well, no complaints.   Objective:   BP 128/93  Pulse 89  Temp(Src) 99.2 F (37.3 C) (Oral)  Resp 16  Ht 5\' 2"  (1.575 m)  Wt 79.198 kg (174 lb 9.6 oz)  BMI 31.93 kg/m2  SpO2 98%  Intake/Output: I/O last 3 completed shifts: In: 240 [P.O.:240] Out: 1306 [Other:1306]   Intake/Output this shift:    Weight change:   Physical Exam: Gen:WD WN AAF in NAD CVS:no rub Resp:cta NFA:OZHYQM Ext:no edema, LUE AVF +T/B  Labs: BMET  Recent Labs Lab 09/22/13 0500 09/23/13 0409 09/24/13 0450 09/26/13 1337 09/27/13 0445 09/27/13 1500 09/28/13 0650  NA 139 139 138 143 143 139 142  K 4.8 4.7 4.6 3.4* 3.6* 2.6* 4.7  CL 97 94* 96 98 102 101 101  CO2 22 20 21 27 23 28 27   GLUCOSE 104* 177* 132* 189* 117* 124* 148*  BUN 55* 31* 44* 21 27* 10 15  CREATININE 9.66* 5.81* 7.51* 4.60* 5.61* 2.28* 3.87*  ALBUMIN 2.7*  --   --  3.3*  --   --   --   CALCIUM 9.0 9.5 9.3 9.8 9.6 7.0* 9.6  PHOS  --   --   --  3.3  --   --   --    CBC  Recent Labs Lab 09/23/13 0409 09/24/13 0450 09/26/13 1337 09/27/13 0445 09/28/13 1405  WBC 9.2 9.6 9.2 8.7  --   HGB 10.7* 10.2* 10.4* 9.6* 9.2*  HCT 34.0* 31.0* 32.1* 30.3* 28.9*  MCV 98.3 95.7 95.8 96.8  --   PLT 117* 152 213 204  --     @IMGRELPRIORS @ Medications:    . acetaminophen  500 mg Oral TID  . antiseptic oral rinse  15 mL Mouth Rinse QID  . chlorhexidine  15 mL Mouth Rinse BID  . darbepoetin (ARANESP) injection - DIALYSIS  100 mcg Intravenous Q Wed-HD  . doxercalciferol  8 mcg Intravenous Q M,W,F-HD  . enoxaparin (LOVENOX) injection  30 mg Subcutaneous Q24H  . famotidine  20 mg Oral QHS  . fentaNYL      . hydrALAZINE  25 mg Oral Q8H  . midazolam      . sevelamer carbonate  1,600 mg Oral TID WC     Dialysis: MWF at [place]  4.5h (4 hr here) 79kg BFR 250 (just started buttonhole to  AVF) Profile 2 F180 No heparin  Hectorol 8 Epo 20K Venofer none   Assessment/Plan:  1. 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 Cont to be major issue and alternatives have been presented but not interested in PD at this time. Unsure if this is related to cerebrovascular disease (which is diffuse and severe), severe Pulm HTN, or CAD. Difficult situation as she wants to wait another day for cerebral angio and understands the benefits of CCPD but is not interested right now.  2. Severe ASCVD: see above, Dr Titus Dubin performed cerebral angio which showed severe 95%Lt ICA stenosis.  Unclear if intervention is being scheduled or if CEA is warranted.  Awaiting final recommendations although pt is not interested in any procedure immediately and may want another opinion from Vascular surgery.  My concern is that she has already had 2 episodes complicating HD and is not interested in PD even though this will prevent drastic swings in BP.  Will cont to follow. 3. Pulm  edema: better 4. ESRD: HD today. Max UF 2-2.5kg to avoid large BP changes. Will use AVF and remove temp HD catheter. Discussed recommendation for possible switch to PD particularly if nothing can be done to improve cerebrovascular blood flow. She did not seem interested in considering PD. Not using heparin because of GIB on coumadin in December, can probably resume this week though 5. Anemia / CKD: on max EPO at center, Hb 12 > 9.6 here after some ABLA. Resumed esa w darbe 100/wk. Tsat 17%, high ferritin. No IV fe for now.  6. MBD / CKD: adjCa 10, phos 5, Cont renvela, cont vit D 7. HTN/volume: hydralazine resumed 25 tid, at dry weight today. Lower DW 1-2 kg as tolerated, keep SBP over 130 on HD due to #2 8. PAD hx L BKA 9. CAD, hx diffuse "distal" disease on cath 2011 10. Dispo- will likely require SNF since she is not interested in PD 11.  Asa Fath A 09/28/2013, 3:07 PM

## 2013-09-28 NOTE — Progress Notes (Signed)
Pt had a bleeding from IJ vein access of aprox 50cc, she has no new complaint or symptom. VS are stable. MD notified, Hemoglobin value is 9.2. Will continue to monitor.  Colleen Canesar Serena Petterson, RN

## 2013-09-28 NOTE — ED Notes (Signed)
MD at bedside.  Explaining findings to pt and family. 

## 2013-09-28 NOTE — Procedures (Signed)
S/P 4 vessel cerebral arteriogram Rt CFA approach. Findings. Severe  stenosis of LT ICA pet cavernous junction of 95% 60 % stenosis of RT ICA prox.. 50 % steosis of LT ICA petrous seg. Occluded RT VBJ

## 2013-09-29 LAB — HEPATITIS B SURFACE ANTIGEN: Hepatitis B Surface Ag: NEGATIVE

## 2013-09-29 LAB — RENAL FUNCTION PANEL
Albumin: 3 g/dL — ABNORMAL LOW (ref 3.5–5.2)
BUN: 22 mg/dL (ref 6–23)
CHLORIDE: 100 meq/L (ref 96–112)
CO2: 25 mEq/L (ref 19–32)
Calcium: 9.8 mg/dL (ref 8.4–10.5)
Creatinine, Ser: 5.35 mg/dL — ABNORMAL HIGH (ref 0.50–1.10)
GFR calc Af Amer: 9 mL/min — ABNORMAL LOW (ref >90)
GFR calc non Af Amer: 8 mL/min — ABNORMAL LOW (ref >90)
GLUCOSE: 141 mg/dL — AB (ref 70–99)
POTASSIUM: 4.9 meq/L (ref 3.7–5.3)
Phosphorus: 3.4 mg/dL (ref 2.3–4.6)
Sodium: 139 mEq/L (ref 137–147)

## 2013-09-29 LAB — GLUCOSE, CAPILLARY
GLUCOSE-CAPILLARY: 120 mg/dL — AB (ref 70–99)
GLUCOSE-CAPILLARY: 145 mg/dL — AB (ref 70–99)
Glucose-Capillary: 145 mg/dL — ABNORMAL HIGH (ref 70–99)
Glucose-Capillary: 146 mg/dL — ABNORMAL HIGH (ref 70–99)

## 2013-09-29 LAB — CBC
HEMATOCRIT: 27.4 % — AB (ref 36.0–46.0)
HEMOGLOBIN: 8.9 g/dL — AB (ref 12.0–15.0)
MCH: 31.8 pg (ref 26.0–34.0)
MCHC: 32.5 g/dL (ref 30.0–36.0)
MCV: 97.9 fL (ref 78.0–100.0)
Platelets: 226 10*3/uL (ref 150–400)
RBC: 2.8 MIL/uL — ABNORMAL LOW (ref 3.87–5.11)
RDW: 18.2 % — AB (ref 11.5–15.5)
WBC: 11.7 10*3/uL — AB (ref 4.0–10.5)

## 2013-09-29 LAB — HEPATITIS B SURFACE ANTIBODY,QUALITATIVE

## 2013-09-29 MED ORDER — NEPRO/CARBSTEADY PO LIQD: 237.0000 mL | ORAL | Status: DC | PRN

## 2013-09-29 MED ORDER — LIDOCAINE HCL (PF) 1 % IJ SOLN: 5.0000 mL | INTRAMUSCULAR | Status: DC | PRN

## 2013-09-29 MED ORDER — HEPARIN SODIUM (PORCINE) 1000 UNIT/ML DIALYSIS: 1000.0000 [IU] | INTRAMUSCULAR | Status: DC | PRN

## 2013-09-29 MED ORDER — LIDOCAINE-PRILOCAINE 2.5-2.5 % EX CREA: 1.0000 "application " | TOPICAL_CREAM | CUTANEOUS | Status: DC | PRN

## 2013-09-29 MED ORDER — SODIUM CHLORIDE 0.9 % IV SOLN: 100.0000 mL | INTRAVENOUS | Status: DC | PRN

## 2013-09-29 MED ORDER — DARBEPOETIN ALFA-POLYSORBATE 100 MCG/0.5ML IJ SOLN
INTRAMUSCULAR | Status: AC
  Administered 2013-09-29: 100 ug via INTRAVENOUS
  Filled 2013-09-29: qty 0.5

## 2013-09-29 MED ORDER — PENTAFLUOROPROP-TETRAFLUOROETH EX AERO: 1.0000 "application " | INHALATION_SPRAY | CUTANEOUS | Status: DC | PRN

## 2013-09-29 MED ORDER — ALTEPLASE 2 MG IJ SOLR: 2.0000 mg | Freq: Once | INTRAMUSCULAR | Status: AC | PRN

## 2013-09-29 MED ORDER — DOXERCALCIFEROL 4 MCG/2ML IV SOLN
INTRAVENOUS | Status: AC
  Administered 2013-09-29: 8 ug via INTRAVENOUS
  Filled 2013-09-29: qty 4

## 2013-09-29 MED ORDER — RENA-VITE PO TABS
1.0000 | ORAL_TABLET | Freq: Every day | ORAL | Status: DC
  Administered 2013-09-29 – 2013-09-30 (×2): 1 via ORAL
  Filled 2013-09-29 (×3): qty 1

## 2013-09-29 MED ORDER — SODIUM CHLORIDE 0.9 % IJ SOLN
10.0000 mL | INTRAMUSCULAR | Status: DC | PRN
  Administered 2013-09-29 – 2013-10-04 (×5): 10 mL

## 2013-09-29 NOTE — Progress Notes (Signed)
Physical Therapy Treatment Patient Details Name: Briana Gould MRN: [id number] DOB: 1946-08-09 Today's Date: 09/29/2013 Time: 4098-1191 PT Time Calculation (min): 30 min  PT Assessment / Plan / Recommendation  History of Present Illness 68 year old female w/ ESRD, [facility] after being treated for UGIB while on coumadin for Right IJ thrombus stroke prevention. Was in usual state of health, getting stronger after her d/c. On 1/12 was at regular scheduled HD. About 20 minutes into HD she slumped over and became unresponsive. Initial rhythm was reported as PEA. CPR started. Reported 20 minutes CPR before ROSC, no drugs. Transferred to [facility] s/p arrest.    PT Comments   Pt is progressing well and was able to perform SPT with min A today to and from [facility]. However, she has yet to have the energy to put on her prosthesis to ambulate and is fatiguing very quickly. She would prefer to go straight home and feels she has adequate support, however, at this time, therapy continues to recommend  ST-SNF for rehab before going home. PT will continue to follow.  Follow Up Recommendations  SNF;Supervision/Assistance - 24 hour     Does the patient have the potential to tolerate intense rehabilitation     Barriers to Discharge        Equipment Recommendations  None recommended by PT    Recommendations for Other Services OT consult  Frequency Min 3X/week   Progress towards PT Goals Progress towards PT goals: Progressing toward goals  Plan Current plan remains appropriate    Precautions / Restrictions Precautions Precautions: Fall Required Braces or Orthoses: Other Brace/Splint Other Brace/Splint: Lt prosthesis Restrictions Weight Bearing Restrictions: No   Pertinent Vitals/Pain VSS    Mobility  Bed Mobility Overal bed mobility: Needs Assistance Bed Mobility: Supine to Sit;Sit to Supine Supine to sit: Min assist Sit to supine: Min assist General bed mobility comments: HOB  nearly flat, one hand assist to get to edge of bed, min A to right LE to return to supine Transfers Overall transfer level: Needs assistance Equipment used: Rolling walker (2 wheeled) Transfers: Sit to/from [employer] Sit to Stand: Min assist Stand pivot transfers: +2 safety/equipment;Min assist General transfer comment: pt stood with one hand on bed and one on RW, +2 present for safety but required only min assist to achieve standing. Pivot turn to [facility] with RW with +2 assist for safety and from [facility] to bed toward right side with min A only. Ambulation/Gait Ambulation/Gait assistance:  (NT) General Gait Details: pt felt she was too  fatigued to din prosthesis to ambulate    Exercises General Exercises - Lower Extremity Straight Leg Raises: AROM;Both;10 reps;Supine   PT Diagnosis:    PT Problem List:   PT Treatment Interventions:     PT Goals (current goals can now be found in the care plan section) Acute Rehab PT Goals Patient Stated Goal: return home PT Goal Formulation: With patient Time For Goal Achievement: 10/10/13 Potential to Achieve Goals: Good  Visit Information  Last PT Received On: 09/29/13 Assistance Needed: +2 (safety) History of Present Illness: 68 year old female w/ ESRD, [facility] after being treated for UGIB while on coumadin for Right IJ thrombus stroke prevention. Was in usual state of health, getting stronger after her d/c. On 1/12 was at regular scheduled HD. About 20 minutes into HD she slumped over and became unresponsive. Initial rhythm was reported as PEA. CPR started. Reported 20 minutes  CPR before ROSC, no drugs. Transferred to [facility] s/p arrest.     Subjective Data  Subjective: pt recently back from HD and reports she is exhausted, refusing PT but then decided she could get up to go to bathroom Patient Stated Goal: return home   Cognition  Cognition Arousal/Alertness: Awake/alert Behavior During Therapy: WFL for  tasks assessed/performed Overall Cognitive Status: Within Functional Limits for tasks assessed General Comments: pt's cognitive status has improved significantly    Balance  Balance Overall balance assessment: Needs assistance Sitting-balance support: Single extremity supported;Feet unsupported Sitting balance-Leahy Scale: Good Sitting balance - Comments: sitting balance improved Standing balance support: Bilateral upper extremity supported;During functional activity Standing balance-Leahy Scale: Poor Standing balance comment: Pt stood with min A with bilateral UE support for 3 mins for BM cleanup but fatigued with this and had difficulty controlling stand to sit after   End of Session PT - End of Session Equipment Utilized During Treatment: Gait belt Activity Tolerance: Patient tolerated treatment well Patient left: in bed;with call bell/phone within reach Nurse Communication: Mobility status   GP   Lyanne CoVictoria Kamaljit Hizer, PT  Acute Rehab Services  [phone]   Lyanne CoManess, [name] 09/29/2013, 3:57 PM

## 2013-09-29 NOTE — Progress Notes (Signed)
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 Readmitted for PEA arrest while on dialysis.   SIGNIFICANT EVENTS / STUDIES:  1/12 cardiac arrest/ PEA at HD. 20 minutes estimated to ROSC  1/12 Echocardiogram: LVEF 50-55%. No RWMAs. Moderate LVH. Probably mild AS. Mild MR. RVSP est 69 mmHg  1/12 CT head: Age uncertain small infarct mid pons. Small infarct in this area may be recent.  1/13 Cognition much improved. Failed SBT  1/15 Extubated successfully  1/20: Cerebral angiogram done   Assessment/Plan:  Cardiac arrest (PEA)  - multiple times - etiology not fully determined,  question carotid stenosis, severe pulm HTN or severe CAD may be contributing when she is becoming hypotensivie during dialysis sessions  - Dr. Arlean HoppingSchertz has asked for interventional neurology consult to evaluate carotid stenosis, cerebral angiogram was scheduled today but patient refused. I have asked IR cerebral angiogram can be rescheduled to tomorrow morning.   - patient is not interested in PD at this time, d/w Dr.Colodonato, plan to have hemodialysis done tomorrow and cont further at SNF MWF.  -No further cardiology evaluation per cardio.   Chest pain: Musculoskeletal likely due to CPR with chest wall tenderness improving:  - Placed her on tramadol as needed and Tylenol 500mg  TID   ESRD (end stage renal disease)  - per  nephrology   Acute respiratory failure with hypoxia/ Pulmonary edema/ diastolic CHF  - s/p arrest  - nephrology team to manage fluid balance   Carotid artery stenosis - 1-39% stenosis involving the left internal carotid artery. - Findings consistent with 60-79% right ICA stenosis  - interventional radiology has been consulted, cerebral angiogram done today by Dr. Corliss Skainseveshwar.  Findings. Severe stenosis of LT ICA pet cavernous junction of 95% , 60 % stenosis of RT ICA prox.. 50 % steosis of LT ICA petrous seg. Occluded RT VBJ -Waiting call back from Dr Corliss Skainseveshwar regarding further testing and plan of care.    Severe Pulmonary HTN with moderate TR, CAD  - cath in 2011 revealed diffuse distal disease   Right IJ thrombus  -found on a previous admission- not a candidate for anticoagulation due to PUD with bleed in 12/14 in setting of Coumadin   PUD - cont Pepcid   Hypertension  - Hydralazine resumed by nephrology   Encephalopathy acute - likely due to anoxia secondary to cardiac arrest- may have new cognitive baseline-  - Levaquin started on 1/16- continue for 5 days  -Resolved.   Severe PAD with left BKA  - family brought in prosthesis in to allow pt to ambulate   DVT Prophylaxis:  Code Status: Full code  Family Communication:   Disposition:  Agree to speak with SW for SNF placement.     Subjective: No complaints. She is now considering SNF.  No complaints.   Objective: Weight change: 1.996 kg (4 lb  6.4 oz)  Intake/Output Summary (Last 24 hours) at 09/29/13 1837 Last data filed at 09/29/13 1240  Gross per 24 hour  Intake      0 ml  Output   1856 ml  Net  -1856 ml   Blood pressure 142/53, pulse 83, temperature 98.3 F (36.8 C), temperature source Oral, resp. rate 17, height 5\' 2"  (1.575 m), weight 79.8 kg (175 lb 14.8 oz), SpO2 100.00%.  Physical Exam: General: Alert and awake, oriented CVS: S1-S2 clear Chest: CTAB Abdomen: soft nontender, nondistended, normal  bowel sounds  Extremities: no cyanosis, clubbing left BKA,   Lab Results: Basic Metabolic Panel:  Recent Labs Lab 09/28/13 0650 09/29/13 0805  NA 142 139  K 4.7 4.9  CL 101 100  CO2 27 25  GLUCOSE 148* 141*  BUN 15 22  CREATININE 3.87* 5.35*  CALCIUM 9.6 9.8  PHOS  --  3.4   Liver Function Tests:  Recent Labs Lab 09/26/13 1337 09/29/13 0805  ALBUMIN 3.3* 3.0*   No results found for this basename: LIPASE, AMYLASE,  in the last 168 hours No results found for this basename: AMMONIA,  in the last 168 hours CBC:  Recent Labs Lab 09/27/13 0445 09/28/13 1405 09/29/13 0805  WBC 8.7  --  11.7*  HGB 9.6* 9.2* 8.9*  HCT 30.3* 28.9* 27.4*  MCV 96.8  --  97.9  PLT 204  --  226   Cardiac Enzymes: No results found for this basename: CKTOTAL, CKMB, CKMBINDEX, TROPONINI,  in the last 168 hours BNP: No components found with this basename: POCBNP,  CBG:  Recent Labs Lab 09/28/13 1608 09/28/13 1614 09/28/13 2222 09/29/13 1311 09/29/13 1628  GLUCAP 157* 140* 145* 120* 145*     Micro Results: Recent Results (from the past 240 hour(s))  MRSA PCR SCREENING     Status: None   Collection Time    09/20/13  1:06 PM      Result Value Range Status   MRSA by PCR NEGATIVE  NEGATIVE Final   Comment:            The GeneXpert MRSA Assay (FDA     approved for NASAL specimens     only), is one component of a     comprehensive MRSA colonization     surveillance program. It is not     intended to diagnose MRSA     infection nor to guide or     monitor treatment for     MRSA infections.    Studies/Results: Ct Head Wo Contrast  09/20/2013   CLINICAL DATA:  Altered mental status ; status post cardiopulmonary resuscitation  EXAM: CT HEAD WITHOUT CONTRAST  TECHNIQUE: Contiguous axial images were obtained from the base of the skull through the vertex without intravenous contrast.  COMPARISON:  None.  FINDINGS: There is age related volume loss. There is no demonstrable mass,  hemorrhage, extra-axial fluid collection, or midline shift. There is mild small vessel disease in the centra semiovale bilaterally. There is decreased attenuation in the midline of the upper to mid pons which may represent a small recent infarct in this area. No other acute appearing infarct is appreciable.  Bony calvarium appears intact.  The mastoid air cells are clear.  IMPRESSION: Age uncertain small infarct mid pons. Small infarct in this area may be recent.  There is underlying age related volume loss with mild periventricular small vessel disease. There is no intracranial mass or hemorrhage. No gray-white compartment edema is appreciable on this  study.   Electronically Signed   By: Bretta Bang M.D.   On: 09/20/2013 12:54   Dg Chest Port 1 View  09/25/2013   CLINICAL DATA:  Edema after dialysis.  EXAM: PORTABLE CHEST - 1 VIEW  COMPARISON:  One-view chest 09/24/2013.  FINDINGS: Cardiac enlargement is stable. A left IJ line is stable in position. Interstitial edema is improved. No focal airspace consolidation is evident.  IMPRESSION: 1. Improved interstitial edema. 2. Stable cardiomegaly.   Electronically Signed   By: Gennette Pac M.D.   On: 09/25/2013 21:55   Dg Chest Port 1 View  09/24/2013   CLINICAL DATA:  Respiratory failure  EXAM: PORTABLE CHEST - 1 VIEW  COMPARISON:  09/23/2013  FINDINGS: Interval tracheal and esophageal extubation. Left IJ catheter in stable position, tip at the superior cavoatrial junction.  Unchanged cardiomegaly and pulmonary edema. These changes could obscure superimposed infection. No evidence of increased pleural fluid. No pneumothorax.  IMPRESSION: 1. Stable lung volumes after tracheal extubation. 2. Unchanged pulmonary edema, which could obscure superimposed consolidation.   Electronically Signed   By: Tiburcio Pea M.D.   On: 09/24/2013 06:23   Dg Chest Port 1 View  09/23/2013   CLINICAL DATA:  Respiratory failure.  EXAM: PORTABLE CHEST - 1 VIEW  COMPARISON:   09/22/2013.  FINDINGS: Endotracheal tube, left IJ line, NG tube in stable position. Persistent cardiomegaly and pulmonary venous congestion present. Interstitial prominence present. These findings consistent with congestive heart failure. Left base atelectasis and/or infiltrate present. Left pleural effusion has improved. No pneumothorax.  IMPRESSION: 1. Stable line and tube positions. 2. Findings consistent with congestive heart failure with pulmonary interstitial edema again noted. Left pleural effusion has improved. 3. Left lower lobe atelectasis and/or infiltrate.   Electronically Signed   By: Maisie Fus  Register   On: 09/23/2013 07:36   Dg Chest Port 1 View  09/22/2013   CLINICAL DATA:  Respiratory failure.  EXAM: PORTABLE CHEST - 1 VIEW  COMPARISON:  Chest radiograph September 21, 2013  FINDINGS: Endotracheal tube tip projects 4.2 cm above the carina. Left internal jugular central venous catheter with distal tip at the cavoatrial junction. Nasogastric tube past the gastroesophageal junction region code though, tip is not visualized. No pneumothorax.  Stable cardiomegaly, worsening interstitial edema, with minimal patchy airspace opacity in left lung base, unchanged. Suspected small left pleural effusion. Soft tissue planes and included osseous structures are unchanged.  IMPRESSION: No apparent change in life-support lines.  Stable cardiomegaly with slightly worsening interstitial edema. Retrocardiac airspace opacity may reflect atelectasis or confluent edema with suspected small left pleural effusion.   Electronically Signed   By: Awilda Metro   On: 09/22/2013 06:31   Dg Chest Port 1 View  09/21/2013   CLINICAL DATA:  Respiratory distress.  Intubated patient.  EXAM: PORTABLE CHEST - 1 VIEW  COMPARISON:  09/20/2013  FINDINGS: Endotracheal tube tip lies 3 cm above the carina. Nasogastric tube passes below the diaphragm well into the stomach. Left internal jugular central venous line tip lies the caval  atrial junction.  Mild hazy airspace opacity is noted in the medial right lung base. This may reflect atelectasis. Infiltrate is possible. Lungs are otherwise clear. No pleural effusion or pneumothorax  IMPRESSION: 1. Support apparatus is stable in well positioned. 2. Mild hazy opacity in the medial right lung base, likely atelectasis. This potentially could reflect a small area of infiltrate or even asymmetric edema. Lungs are otherwise clear. Findings are similar to the prior exam allowing  for differences in patient positioning and technique.   Electronically Signed   By: Amie Portland M.D.   On: 09/21/2013 12:27   Dg Chest Port 1 View  09/20/2013   CLINICAL DATA:  Post IJ placement  EXAM: PORTABLE CHEST - 1 VIEW  COMPARISON:  Prior chest x-ray 09/20/2013 at 9:19 a.m.  FINDINGS: Interval placement of a left IJ approach central venous catheter. The tip of the catheter is in good position at the superior cavoatrial junction. No evidence of complicating pneumothorax. The patient remains intubated. The tip of the endotracheal tube is 4 cm above the carina. A nasogastric tube is present, the tip lies off the field of view presumably within the stomach. External defibrillator pads and metallic artifacts overlie the chest. Stable cardiomegaly, very low inspiratory volumes and mild pulmonary edema.  IMPRESSION: 1. New left IJ approach central venous catheter with the tip at the superior cavoatrial junction. No evidence of pneumothorax or other complication. 2. Otherwise, no significant interval change in the appearance of the chest.   Electronically Signed   By: Malachy Moan M.D.   On: 09/20/2013 11:48   Dg Chest Portable 1 View  09/20/2013   CLINICAL DATA:  Status post intubation.  EXAM: PORTABLE CHEST - 1 VIEW  COMPARISON:  None.  FINDINGS: 0919 hrs. Endotracheal tube tip is 3.8 cm above the base of the carina. The NG tube passes into the stomach although the distal tip position is not included on the film.  Lung volumes are low. The cardio pericardial silhouette is enlarged. Diffuse interstitial opacity is associated with vascular congestion. Telemetry leads overlie the chest. No evidence for pneumothorax or substantial pleural effusion.  IMPRESSION: Low volume film with enlargement of the cardiopericardial silhouette. Vascular congestion with interstitial opacities suggest associated edema.   Electronically Signed   By: Kennith Center M.D.   On: 09/20/2013 09:30    Medications: Scheduled Meds: . acetaminophen  500 mg Oral TID  . antiseptic oral rinse  15 mL Mouth Rinse QID  . chlorhexidine  15 mL Mouth Rinse BID  . darbepoetin (ARANESP) injection - DIALYSIS  100 mcg Intravenous Q Wed-HD  . doxercalciferol  8 mcg Intravenous Q M,W,F-HD  . enoxaparin (LOVENOX) injection  30 mg Subcutaneous Q24H  . famotidine  20 mg Oral QHS  . hydrALAZINE  25 mg Oral Q8H  . multivitamin  1 tablet Oral QHS  . sevelamer carbonate  1,600 mg Oral TID WC      LOS: 9 days   Briana Gould M.D. [facility] 09/29/2013, 6:37 PM Pager: [phone]  If 7PM-7AM, please contact night-coverage [email or web address] Password [email or web address]

## 2013-09-29 NOTE — Progress Notes (Signed)
CSW spoke with patient again about going to SNF. Patient reported that she is still not agreeable to going to SNF. Clinical Social Worker will sign off for now as social work intervention is no longer needed. Please consult us again if new need arises.   Sabino NiemannAmy Mayzee Reichenbach, MSW, [employer]CSWA [phone]

## 2013-09-29 NOTE — Progress Notes (Signed)
   CARE MANAGEMENT NOTE 09/29/2013  Patient:  Briana Gould,Briana Gould   Account Number:  [id number]  Date Initiated:  09/20/2013  Documentation initiated by:  Briana CreamerWELL,Briana  Subjective/Objective Assessment:   adm w cardiac arrest, vent     Action/Plan:   lives w husb, goes to hd for esrd   Anticipated DC Date:     Anticipated DC Plan:  HOME W HOME HEALTH SERVICES      DC Planning Services  CM consult      [facility]  Resumption Of Svcs/PTA Provider   Choice offered to / List presented to:          [facility]  [facility]      [facility] agency  [facility]   Status of service:   Medicare Important Message given?   (If response is "NO", the following Medicare IM given date fields will be blank) Date Medicare IM given:   Date Additional Medicare IM given:    Discharge Disposition:  HOME W HOME HEALTH SERVICES  Per UR Regulation:  Reviewed for med. necessity/level of care/duration of stay  If discussed at Long Length of Stay Meetings, dates discussed:   09/28/2013  09/30/2013    Comments:  09-29-13 [address] Ellias Mcelreath Graves-Bigelow, RN,BSN [phone] CM did speak to pt and she is agreeable [facility]. CM did call CSW to make her aware. CM will also continue to monitor for home needs.   09-27-13 [address] Briana Gould, [place] [phone] Plan for HD today and then IR cerebral angiogram hopefully 09-28-13. Plan will be for home when medically stable for d/c. CM will continue to monitor.  1/14 1423 Briana dowell rn,bsn [facility] called and they are act w pt for hhrn.

## 2013-09-29 NOTE — Progress Notes (Signed)
[facility] Progress Note  Subjective:   Hasn't totally ruled out PD " will get more information" when she gets out of here. Has 3 children of her daughter's that she takes care of that is a great issue.  Objective Filed Vitals:   09/29/13 0806 09/29/13 0833 09/29/13 0900 09/29/13 0930  BP: 148/82 156/54 142/56 161/50  Pulse: 69 78 68 69  Temp:      TempSrc:      Resp: 18 21 37 19  Height:      Weight:      SpO2:       Physical Exam goal 2.5 pre HD weight 81.6 in bed General: NAD  Heart: RRR Lungs: no wheezes or rales Abdomen: soft Extremities: right BKA; left LE + edema Dialysis Access: AVF Qb 400  Dialysis: MWF at [facility]  4.5h (4 hr here) 79kg BFR 250 (just started buttonhole to AVF) Profile 2 F180 No heparin  Hectorol 8 Epo 20K Venofer none   Assessment/Plan:  1. 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 Cont to be major issue and alternatives have been presented but not interested in PD at this time. Unsure if this is related to cerebrovascular disease (which is diffuse and severe), severe Pulm HTN, or CAD.  2. Severe ASCVD: see above, Dr Titus Dubin performed cerebral angio which showed severe 95%Lt ICA stenosis. Unclear if intervention is being scheduled or if CEA is warranted. Awaiting final recommendations although pt is not interested in any procedure immediately and may want another opinion from Vascular surgery. My concern is that she has already had 2 episodes complicating HD and is notvery interested in PD even though this will prevent drastic swings in BP. Will cont to follow. 3. Pulm edema: better 4. ESRD: MWF HD - on HD. Max UF 2-2.5kg to avoid large BP changes. Will use AVF and remove temp HD catheter.after HD today;  Discussed again recommendation for possible switch to PD particularly if nothing can be done to improve cerebrovascular blood flow. Not using heparin because of GIB on coumadin in December, can probably resume this week  though 5. Anemia / CKD: on max EPO at center, Hb 12 > 9.6 >8.9 here after some ABLA. Resumed esa w darbe 100/wk. Tsat 17%, high ferritin. No IV fe for now---had been on 20K epo as an outpt - will increase  6. MBD / CKD: adjCa 10.6, phos 3.4, Cont renvela, cont Hectorol 8 - change to 2 Ca bath for the rest of her tmt 7. HTN/volume: hydralazine resumed 25 tid, at dry weight today. Lower DW 1-2 kg as tolerated, keep SBP over 130 on HD due to #2; keep SBP>130. 8. PAD hx L BKA 9. CAD, hx diffuse "distal" disease on cath 2011 10. Dispo-may require SNF since she is not interested in PD -BUT given her responsibility in raising 3 children (out of town brother helping at present), doubt she will agree to this.  Sheffield Slider, PA-C [facility] Beeper [phone] 09/29/2013,9:40 AM  LOS: 9 days    Additional Objective Labs: Basic Metabolic Panel:  Recent Labs Lab 09/26/13 1337  09/27/13 1500 09/28/13 0650 09/29/13 0805  NA 143  < > 139 142 139  K 3.4*  < > 2.6* 4.7 4.9  CL 98  < > 101 101 100  CO2 27  < > 28 27 25   GLUCOSE 189*  < > 124* 148* 141*  BUN 21  < > 10 15 22   CREATININE 4.60*  < > 2.28*  3.87* 5.35*  CALCIUM 9.8  < > 7.0* 9.6 9.8  PHOS 3.3  --   --   --  3.4  < > = values in this interval not displayed. Liver Function Tests:  Recent Labs Lab 09/26/13 1337 09/29/13 0805  ALBUMIN 3.3* 3.0*   CBC:  Recent Labs Lab 09/23/13 0409 09/24/13 0450 09/26/13 1337 09/27/13 0445 09/28/13 1405 09/29/13 0805  WBC 9.2 9.6 9.2 8.7  --  11.7*  HGB 10.7* 10.2* 10.4* 9.6* 9.2* 8.9*  HCT 34.0* 31.0* 32.1* 30.3* 28.9* 27.4*  MCV 98.3 95.7 95.8 96.8  --  97.9  PLT 117* 152 213 204  --  226  CBG:  Recent Labs Lab 09/28/13 1216 09/28/13 1606 09/28/13 1608 09/28/13 1614 09/28/13 2222  GLUCAP 129* 435* 157* 140* 145*  Medications: . sodium chloride 7 mL/hr at 09/22/13 1711  . sodium chloride     . acetaminophen  500 mg Oral TID  . antiseptic oral rinse  15 mL  Mouth Rinse QID  . chlorhexidine  15 mL Mouth Rinse BID  . darbepoetin (ARANESP) injection - DIALYSIS  100 mcg Intravenous Q Wed-HD  . doxercalciferol  8 mcg Intravenous Q M,W,F-HD  . enoxaparin (LOVENOX) injection  30 mg Subcutaneous Q24H  . famotidine  20 mg Oral QHS  . hydrALAZINE  25 mg Oral Q8H  . sevelamer carbonate  1,600 mg Oral TID WC

## 2013-09-29 NOTE — Progress Notes (Signed)
I have seen and examined this patient and agree with plan as outlined by M. Doran DurandBergman, PA-C.  No clear decision or discussion regarding findings of cerebral angiogram (does she require CEA or is this amenable to endovascular intervention, what is her longterm risk of CVA without intervention, etc.)  Pt not willing to go to SNF and dispo may be an issue. Audray Rumore A,MD 09/29/2013 10:47 AM

## 2013-09-29 NOTE — Procedures (Signed)
Patient was seen on dialysis and the procedure was supervised. BFR 250 Via LUE AVF BP is 154/50.  Patient appears to be tolerating treatment well.  Will need to dc fem HD cath.

## 2013-09-30 DIAGNOSIS — R5381 Other malaise: Secondary | ICD-10-CM

## 2013-09-30 DIAGNOSIS — L98499 Non-pressure chronic ulcer of skin of other sites with unspecified severity: Secondary | ICD-10-CM

## 2013-09-30 DIAGNOSIS — I6330 Cerebral infarction due to thrombosis of unspecified cerebral artery: Secondary | ICD-10-CM

## 2013-09-30 DIAGNOSIS — S88119A Complete traumatic amputation at level between knee and ankle, unspecified lower leg, initial encounter: Secondary | ICD-10-CM

## 2013-09-30 DIAGNOSIS — I739 Peripheral vascular disease, unspecified: Secondary | ICD-10-CM

## 2013-09-30 LAB — GLUCOSE, CAPILLARY
GLUCOSE-CAPILLARY: 151 mg/dL — AB (ref 70–99)
GLUCOSE-CAPILLARY: 153 mg/dL — AB (ref 70–99)
Glucose-Capillary: 135 mg/dL — ABNORMAL HIGH (ref 70–99)
Glucose-Capillary: 215 mg/dL — ABNORMAL HIGH (ref 70–99)

## 2013-09-30 NOTE — Progress Notes (Signed)
[name], female   DOB: [birth year], 68 y.o.   MRN: [id number]  [facility] Progress Note    Subjective:   No new complaints, has many questions regarding possible endovascular intervention for LICA stenosis   Objective:   BP 126/64  Pulse 76  Temp(Src) 99.1 F (37.3 C) (Oral)  Resp 18  Ht 5\' 2"  (1.575 m)  Wt 79.8 kg (175 lb 14.8 oz)  BMI 32.17 kg/m2  SpO2 98%  Intake/Output: I/O last 3 completed shifts: In: 0  Out: 1856 [Other:1856]   Intake/Output this shift:    Weight change: 0.406 kg (14.3 oz)  Physical Exam: Gen:WD WN AAF in NAd CVS:no rub Resp:cta WUJ:WJXBJY Ext:1+ edema, right leg, LUE AVF +T/B  Labs: BMET  Recent Labs Lab 09/24/13 0450 09/26/13 1337 09/27/13 0445 09/27/13 1500 09/28/13 0650 09/29/13 0805  NA 138 143 143 139 142 139  K 4.6 3.4* 3.6* 2.6* 4.7 4.9  CL 96 98 102 101 101 100  CO2 21 27 23 28 27 25   GLUCOSE 132* 189* 117* 124* 148* 141*  BUN 44* 21 27* 10 15 22   CREATININE 7.51* 4.60* 5.61* 2.28* 3.87* 5.35*  ALBUMIN  --  3.3*  --   --   --  3.0*  CALCIUM 9.3 9.8 9.6 7.0* 9.6 9.8  PHOS  --  3.3  --   --   --  3.4   CBC  Recent Labs Lab 09/24/13 0450 09/26/13 1337 09/27/13 0445 09/28/13 1405 09/29/13 0805  WBC 9.6 9.2 8.7  --  11.7*  HGB 10.2* 10.4* 9.6* 9.2* 8.9*  HCT 31.0* 32.1* 30.3* 28.9* 27.4*  MCV 95.7 95.8 96.8  --  97.9  PLT 152 213 204  --  226    @IMGRELPRIORS @ Medications:    . acetaminophen  500 mg Oral TID  . antiseptic oral rinse  15 mL Mouth Rinse QID  . chlorhexidine  15 mL Mouth Rinse BID  . darbepoetin (ARANESP) injection - DIALYSIS  100 mcg Intravenous Q Wed-HD  . doxercalciferol  8 mcg Intravenous Q M,W,F-HD  . enoxaparin (LOVENOX) injection  30 mg Subcutaneous Q24H  . famotidine  20 mg Oral QHS  . hydrALAZINE  25 mg Oral Q8H  . multivitamin  1 tablet Oral QHS  . sevelamer carbonate  1,600 mg Oral TID WC     Dialysis: MWF at [facility]  4.5h (4 hr here) 79kg BFR  250 (just started buttonhole to AVF) Profile 2 F180 No heparin  Hectorol 8 Epo 20K Venofer none   Assessment/Plan:  1. 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 Cont to be major issue and alternatives have been presented but not interested in PD at this time. Unsure if this is related to cerebrovascular disease (which is diffuse and severe), severe Pulm HTN, or CAD.  2. Severe ASCVD: see above, Dr Titus Dubin performed cerebral angio which showed severe 95%Lt ICA stenosis. Unclear if intervention is being scheduled or if CEA is warranted. Awaiting final recommendations from Dr. Titus Dubin today, although pt has flip flopped on her interest in any procedure or change in therapy.  This may want another opinion from Vascular surgery. The major concern is that she has already had 2 episodes complicating HD and is not very interested in PD even though this will prevent drastic swings in BP. Will cont to follow. 1. Issues remain that she has risk of recurrent GIB with asa and plavix as well as an increased risk for severe left hemispheric stroke if nothing  is done. 2. Dr. Titus Dubinevashwar spoke with family today and await there decision 3. May benefit from GI eval as she has not had f/u since her bleed last month due to this hospitalization 4. Will need to be on plavix for 5 days before procedure can be done. 3. Pulm edema: better 4. ESRD: MWF HD - on HD. Max UF 2-2.5kg to avoid large BP changes. Will use AVF and remove temp HD catheter.after HD today; Discussed again recommendation for possible switch to PD particularly if nothing can be done to improve cerebrovascular blood flow. Not using heparin because of GIB on coumadin in December, can probably resume this week though 1. May need to be on HD for 4 days/week if not willing/able to do CCPD 5. Anemia / CKD: on max EPO at center, Hb 12 > 9.6 >8.9 here after some ABLA. Resumed esa w darbe 100/wk. Tsat 17%, high ferritin. No IV fe for now---had been on 20K epo  as an outpt - will increase  6. MBD / CKD: adjCa 10.6, phos 3.4, Cont renvela, cont Hectorol 8 - change to 2 Ca bath for the rest of her tmt 7. HTN/volume: hydralazine resumed 25 tid, at dry weight today. Lower DW 1-2 kg as tolerated, keep SBP over 130 on HD due to #2; keep SBP>130. 8. PAD hx L BKA 9. CAD, hx diffuse "distal" disease on cath 2011 10. Dispo-may require SNF since she is not interested in PD -BUT given her responsibility in raising 3 children (out of town brother helping at present), doubt she will agree to this, although again she has waffled on this as well. 11.   Briana Gould A 09/30/2013, 10:12 AM

## 2013-09-30 NOTE — Consult Note (Signed)
Physical Medicine and Rehabilitation Consult Reason for Consult: Acute encephalopathy Referring Physician: [facility]    HPI: Briana Gould is a 68 y.o. 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 Patient did receive CPR x20 minutes. Cranial CT scan showed age uncertain a small infarct mid pons. Small infarct in this area felt to be possibly recent. Echocardiogram ejection fraction of 55%. Patient remained intubated. There is no ST segment elevation on EKG. MRA showed significant intracranial atherosclerotic type changes. Carotid Dopplers 60-79% right ICA stenosis. Patient not felt to be a surgical intervention candidate. EEG showed no seizure activity. 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 Hemodialysis ongoing as per renal services. Patient was extubated 09/23/2013. 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 60% stenosis of right ICA approximately and 50% stenosis left ICA petrous segment. Currently maintained on subcutaneous Lovenox for DVT prophylaxis. Await plan to begin aspirin Plavix to be discussed with gastroenterology services after recent GI bleed. Physical therapy evaluation completed 09/26/2013 with slow progressive gains. M.D. as requested physical medicine rehabilitation consult to consider inpatient rehabilitation services.  Patient stated she was tired today but feels more awake now and was hoping she can get some therapy at this time  Prior to admission was  independent using a prosthetic device for left BKA amputation, she has this here with her today. Review of Systems  Cardiovascular: Positive for chest pain and leg swelling.  Gastrointestinal: Positive for constipation.  Psychiatric/Behavioral: Positive for memory loss.  All other systems reviewed and are negative.   Past Medical History  Diagnosis Date  . End stage renal disease on dialysis   . Peripheral arterial occlusive disease   . Hypertension   . Cardiac arrest   . Diabetes mellitus type 2, uncontrolled, with complications   . DVT (deep venous thrombosis)      Right internal jugular vein  . CAD (coronary artery disease), native coronary artery 2011    Diffuse distal vessel CAD noted at cardiac catheterization   Past Surgical History  Procedure Laterality Date  . Below knee leg amputation Left   . Av fistula placement Left    History reviewed. No pertinent family history. Social History:  reports that she has never smoked. She does not have any smokeless tobacco history on file. She reports that she does not drink alcohol or use illicit drugs. Allergies: No Known Allergies Medications Prior to Admission  Medication Sig Dispense Refill  . amLODipine (NORVASC) 5 MG tablet Take 5 mg by mouth daily.      Marland Kitchen b complex-vitamin c-folic acid (NEPHRO-VITE) 0.8 MG TABS tablet Take 1 tablet by mouth daily.      Marland Kitchen doxazosin (CARDURA) 8 MG tablet Take 8 mg by mouth daily.      . famotidine (PEPCID) 40 MG tablet Take 40 mg by mouth daily.      . hydrALAZINE (APRESOLINE) 50 MG tablet Take 50 mg by mouth 2 (two) times daily.      Marland Kitchen lisinopril (PRINIVIL,ZESTRIL) 40 MG tablet Take 40 mg by mouth daily.      . Nutritional Supplements (FEEDING SUPPLEMENT, NEPRO CARB STEADY,) LIQD  Take 237 mLs by mouth daily.      . sevelamer carbonate (RENVELA) 800 MG tablet Take 1,600 mg by mouth 3 (three) times daily with meals.       . traMADol (ULTRAM) 50 MG tablet Take 50 mg by mouth every 12 (twelve)  hours as needed (for pain).         Home: Home Living Family/patient expects to be discharged to:: Private residence Living Arrangements: Spouse/significant other Available Help at Discharge: Family;Available PRN/intermittently Type of Home: House Home Access: Ramped entrance Home Layout: One level Home Equipment: Walker - 2 wheels;Wheelchair - manual;Shower seat - built in;Bedside commode Additional Comments: husband is disabled and cannot provide physical (A)   Functional History: Prior Function Comments: PTA pt and sister reports that pt was independent with mobility and ADLs with Lt prosthesis  Functional Status:  Mobility:     Ambulation/Gait General Gait Details: pt felt she was too  fatigued to din prosthesis to ambulate    ADL:    Cognition: Cognition Overall Cognitive Status: Within Functional Limits for tasks assessed Orientation Level: Oriented X4 Cognition Arousal/Alertness: Awake/alert Behavior During Therapy: WFL for tasks assessed/performed Overall Cognitive Status: Within Functional Limits for tasks assessed Area of Impairment: Orientation;Memory;Following commands;Problem solving;Awareness;Attention;Safety/judgement Orientation Level: Disoriented to;Situation;Time Current Attention Level: Alternating Memory: Decreased short-term memory Following Commands: Follows one step commands with increased time Safety/Judgement: Decreased awareness of deficits;Decreased awareness of safety Awareness: Anticipatory Problem Solving: Slow processing;Difficulty sequencing;Requires verbal cues;Requires tactile cues General Comments: pt's cognitive status has improved significantly  Blood pressure 126/64, pulse 76, temperature 99.1 F (37.3 C), temperature source Oral, resp. rate 18, height 5\' 2"  (1.575 m), weight 79.8 kg (175 lb 14.8 oz), SpO2 98.00%. Physical Exam  Vitals reviewed. Constitutional: She is oriented to person, place, and time.  68 year old female  sitting at the edge of the bed  HENT:  Head: Normocephalic.  Eyes: EOM are normal.  Neck: Normal range of motion. Neck supple. No thyromegaly present.  Cardiovascular: Normal rate and regular rhythm.   Respiratory: Effort normal and breath sounds normal. No respiratory distress.  GI: Soft. Bowel sounds are normal. She exhibits no distension.  Neurological: She is alert and oriented to person, place, and time.  Follows full commands. She could not recall hospital course that was quite medically compromised  Skin: Skin is warm and dry.  Left BKA well-healed   motor strength is 5/5 in bilateral deltoid, bicep, tricep, grip, 4/5 right hip flexor knee extensor ankle dorsiflexor plantar flexor 4/5 left hip flexor Sensory intact to light touch in the right lower extremity 2+ edema in the right foot and ankle area  Results for orders placed during the hospital encounter of 09/20/13 (from the past 24 hour(s))  GLUCOSE, CAPILLARY     Status: Abnormal   Collection Time    09/29/13  1:11 PM      Result Value Range   Glucose-Capillary 120 (*) 70 - 99 mg/dL  GLUCOSE, CAPILLARY     Status: Abnormal   Collection Time    09/29/13  4:28 PM      Result Value Range   Glucose-Capillary 145 (*) 70 - 99 mg/dL   Comment 1 Notify RN     Comment 2 Documented in Chart    GLUCOSE, CAPILLARY     Status: Abnormal   Collection Time    09/29/13 10:03 PM      Result Value Range   Glucose-Capillary 146 (*) 70 - 99 mg/dL   Comment 1 Notify  RN    GLUCOSE, CAPILLARY     Status: Abnormal   Collection Time    09/30/13  8:04 AM      Result Value Range   Glucose-Capillary 135 (*) 70 - 99 mg/dL   Comment 1 Documented in Chart     Comment 2 Notify RN     No results found.  Assessment/Plan: Diagnosis: Deconditioning secondary to cardiac arrest in a patient with end-stage renal disease 1. Does the need for close, 24 hr/day medical supervision in concert with the patient's rehab needs make it unreasonable for this  patient to be served in a less intensive setting? Yes 2. Co-Morbidities requiring supervision/potential complications: GI bleed, encephalopathy 3. Due to bladder management, bowel management, safety, skin/wound care, disease management, medication administration, pain management and patient education, does the patient require 24 hr/day rehab nursing? Yes 4. Does the patient require coordinated care of a physician, rehab nurse, PT (1-2 hrs/day, 5 days/week), OT (1-2 hrs/day, 5 days/week) and SLP (0.5-1 hrs/day, 5 days/week) to address physical and functional deficits in the context of the above medical diagnosis(es)? Yes Addressing deficits in the following areas: balance, endurance, locomotion, strength, transferring, bowel/bladder control, bathing, dressing, toileting and cognition 5. Can the patient actively participate in an intensive therapy program of at least 3 hrs of therapy per day at least 5 days per week? Yes 6. The potential for patient to make measurable gains while on inpatient rehab is good 7. Anticipated functional outcomes upon discharge from inpatient rehab are modified independent mobility with PT, modified independent ADLs with OT, evaluate cognition, modified independent medication management with SLP. 8. Estimated rehab length of stay to reach the above functional goals is: 7-9 days 9. Does the patient have adequate social supports to accommodate these discharge functional goals? Yes 10. Anticipated D/C setting: Home 11. Anticipated post D/C treatments: HH therapy 12. Overall Rehab/Functional Prognosis: good  RECOMMENDATIONS: This patient's condition is appropriate for continued rehabilitative care in the following setting: CIR Patient has agreed to participate in recommended program. Yes Note that insurance prior authorization may be required for reimbursement for recommended care.  Comment:     09/30/2013

## 2013-09-30 NOTE — Progress Notes (Signed)
NUTRITION FOLLOW UP  Intervention:    Offer Resource Breeze PO BID, each supplement provides 250 kcal and 9 grams of protein  Nutritional Services Ambassador to assist patient with meal options.  Nutrition Dx:   Inadequate oral intake now related to decreased appetite as evidenced by variable intake of meals. Ongoing.  Goal:   Intake to meet >90% of estimated nutrition needs. Unmet.  Monitor:   PO intake, labs, weight trend.  Assessment:   Pt admitted after cardiac arrest during HD session.  Currently receiving Renal diet with variable oral intake. She says that she has not been receiving the foods that she orders for breakfast. She also has not been offered [employer] Breeze supplement. Her appetite is okay, but not great.  Height: Ht Readings from Last 1 Encounters:  09/20/13 5\' 2"  (1.575 m)    Weight Status:   Wt Readings from Last 1 Encounters:  09/29/13 175 lb 14.8 oz (79.8 kg)  09/24/13  184 lb 8.4 oz (83.7 kg)   Re-estimated needs:  Kcal: 1500-1800 Protein: 80-90 gm Fluid: 1.2 L  Skin: left groin incision  Diet Order: Renal 80/90-2-2 with 1200 ml fluid restriction; Resource Breeze PO BID   Intake/Output Summary (Last 24 hours) at 09/30/13 1006 Last data filed at 09/29/13 1240  Gross per 24 hour  Intake      0 ml  Output   1856 ml  Net  -1856 ml    Last BM: 1/16   Labs:   Recent Labs Lab 09/26/13 1337  09/27/13 1500 09/28/13 0650 09/29/13 0805  NA 143  < > 139 142 139  K 3.4*  < > 2.6* 4.7 4.9  CL 98  < > 101 101 100  CO2 27  < > 28 27 25   BUN 21  < > 10 15 22   CREATININE 4.60*  < > 2.28* 3.87* 5.35*  CALCIUM 9.8  < > 7.0* 9.6 9.8  PHOS 3.3  --   --   --  3.4  GLUCOSE 189*  < > 124* 148* 141*  < > = values in this interval not displayed.  CBG (last 3)   Recent Labs  09/29/13 1628 09/29/13 2203 09/30/13 0804  GLUCAP 145* 146* 135*    Scheduled Meds: . acetaminophen  500 mg Oral TID  . antiseptic oral rinse  15 mL Mouth Rinse QID   . chlorhexidine  15 mL Mouth Rinse BID  . darbepoetin (ARANESP) injection - DIALYSIS  100 mcg Intravenous Q Wed-HD  . doxercalciferol  8 mcg Intravenous Q M,W,F-HD  . enoxaparin (LOVENOX) injection  30 mg Subcutaneous Q24H  . famotidine  20 mg Oral QHS  . hydrALAZINE  25 mg Oral Q8H  . multivitamin  1 tablet Oral QHS  . sevelamer carbonate  1,600 mg Oral TID WC    Continuous Infusions: . sodium chloride 7 mL/hr at 09/22/13 1711  . sodium chloride       Joaquin CourtsKimberly Harris, RD, LDN, CNSC Pager [phone] After Hours Pager [phone]

## 2013-09-30 NOTE — Progress Notes (Signed)
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 Readmitted for PEA arrest while on dialysis.   SIGNIFICANT EVENTS / STUDIES:  1/12 cardiac arrest/ PEA at HD. 20 minutes estimated to ROSC  1/12 Echocardiogram: LVEF 50-55%. No RWMAs. Moderate LVH. Probably mild AS. Mild MR. RVSP est 69 mmHg  1/12 CT head: Age uncertain small infarct mid pons. Small infarct in this area may be recent.  1/13 Cognition much improved. Failed SBT  1/15 Extubated successfully  1/20: Cerebral angiogram done   Assessment/Plan:  Cardiac arrest (PEA)  - multiple times - etiology not fully determined,  question carotid stenosis, severe pulm HTN or severe CAD may be contributing when she is becoming hypotensivie during dialysis sessions  - Dr. Arlean Hopping has asked for interventional neurology consult to evaluate carotid stenosis, cerebral angiogram was scheduled today but patient refused. I have asked IR cerebral angiogram can be rescheduled to tomorrow morning.   - patient is not interested in PD at this time, d/w Dr.Colodonato, plan to have hemodialysis done tomorrow and cont further at SNF MWF.  -No further cardiology evaluation per cardio.   Chest pain: Musculoskeletal likely due to CPR with chest wall tenderness improving:  - Placed her on tramadol as needed and Tylenol 500mg  TID   ESRD (end stage renal disease)  - per  nephrology   Acute respiratory failure with hypoxia/ Pulmonary edema/ diastolic CHF  - s/p arrest  - nephrology team to manage fluid balance   Carotid artery stenosis - 1-39% stenosis involving the left internal carotid artery. - Findings consistent with 60-79% right ICA stenosis  - interventional radiology has been consulted, cerebral angiogram done today by Dr. Corliss Skains.  Findings. Severe stenosis of LT ICA pet cavernous junction of 95% , 60 % stenosis of RT ICA prox.. 50 % steosis of LT ICA petrous seg. Occluded RT VBJ -Plan for intracranial angiogram and or possible stent. Dr Elnoria Howard Consulted to give [place] recommendation regarding starting patient on plavix and aspirin. Patient needs to be on plavix and aspirin for procedure for at least 5 days. Patient with prior history of GI bleed.    Severe Pulmonary HTN with moderate TR, CAD  - cath in 2011 revealed diffuse distal disease   Right IJ thrombus  -found on a previous admission- not a candidate for anticoagulation due to PUD with bleed in 12/14 in setting of Coumadin   PUD - cont Pepcid   Hypertension  - Hydralazine resumed by nephrology   Encephalopathy acute - likely due to anoxia secondary to cardiac arrest- may have new cognitive baseline-  - Levaquin started on 1/16- continue for 5 days . Finished course.  -Resolved.   Severe PAD with left BKA  - family brought in prosthesis in to allow pt to ambulate   DVT Prophylaxis:  Code Status: Full code  Family Communication:  Disposition: consult GI to see if patient can be on plavix and aspirin for intracranial angioplastic. CIR/LTAC consulted.     Subjective: No complaints. Considering procedure. Wondering if she can go to CIR, or LTAC.   Objective: Weight change: 0.406 kg (14.3 oz)  Intake/Output Summary (Last 24 hours) at 09/30/13 1134 Last data filed at 09/29/13 1240  Gross per 24 hour  Intake      0 ml  Output   1856 ml  Net  -1856 ml   Blood pressure  126/64, pulse 76, temperature 99.1 F (37.3 C), temperature source Oral, resp. rate 18, height 5\' 2"  (1.575 m), weight 79.8 kg (175 lb 14.8 oz), SpO2 98.00%.  Physical Exam: General: Alert and awake, oriented CVS: S1-S2 clear Chest: CTAB Abdomen: soft nontender, nondistended, normal bowel sounds  Extremities: no cyanosis, clubbing left BKA,   Lab Results: Basic Metabolic Panel:  Recent Labs Lab 09/28/13 0650 09/29/13 0805  NA 142 139  K 4.7 4.9  CL 101 100  CO2 27 25  GLUCOSE 148* 141*  BUN 15 22  CREATININE 3.87* 5.35*  CALCIUM 9.6 9.8  PHOS  --  3.4   Liver Function Tests:  Recent Labs Lab 09/26/13 1337 09/29/13 0805  ALBUMIN 3.3* 3.0*   No results found for this basename: LIPASE, AMYLASE,  in the last 168 hours No results found for this basename: AMMONIA,  in the last 168 hours CBC:  Recent Labs Lab 09/27/13 0445 09/28/13 1405 09/29/13 0805  WBC 8.7  --  11.7*  HGB 9.6* 9.2* 8.9*  HCT 30.3* 28.9* 27.4*  MCV 96.8  --  97.9  PLT 204  --  226   Cardiac Enzymes: No results found for this basename: CKTOTAL, CKMB, CKMBINDEX, TROPONINI,  in the last 168 hours BNP: No components found with this basename: POCBNP,  CBG:  Recent Labs Lab 09/28/13 2222 09/29/13 1311 09/29/13 1628 09/29/13 2203 09/30/13 0804  GLUCAP 145* 120* 145* 146* 135*     Micro Results: Recent Results (from the past 240 hour(s))  MRSA PCR SCREENING     Status: None   Collection Time    09/20/13  1:06 PM      Result Value Range Status   MRSA by PCR NEGATIVE  NEGATIVE Final   Comment:            The GeneXpert MRSA Assay (FDA     approved for NASAL specimens     only), is one component of a     comprehensive MRSA colonization     surveillance program. It is not     intended to diagnose MRSA     infection nor to guide or     monitor treatment for     MRSA infections.    Studies/Results: Ct Head Wo Contrast  09/20/2013   CLINICAL DATA:  Altered mental status ; status post  cardiopulmonary resuscitation  EXAM: CT HEAD WITHOUT CONTRAST  TECHNIQUE: Contiguous axial images were obtained from the base of the skull through the vertex without intravenous contrast.  COMPARISON:  None.  FINDINGS: There is age related volume loss. There is no demonstrable mass, hemorrhage, extra-axial fluid collection, or midline shift. There is mild small vessel disease in the centra semiovale bilaterally. There is decreased attenuation in the midline of the upper to mid pons which may represent a small recent infarct in this area. No other acute appearing infarct is appreciable.  Bony calvarium appears intact.  The mastoid air cells are clear.  IMPRESSION:  Age uncertain small infarct mid pons. Small infarct in this area may be recent.  There is underlying age related volume loss with mild periventricular small vessel disease. There is no intracranial mass or hemorrhage. No gray-white compartment edema is appreciable on this study.   Electronically Signed   By: Bretta Bang M.D.   On: 09/20/2013 12:54   Dg Chest Port 1 View  09/25/2013   CLINICAL DATA:  Edema after dialysis.  EXAM: PORTABLE CHEST - 1 VIEW  COMPARISON:  One-view chest 09/24/2013.  FINDINGS: Cardiac enlargement is stable. A left IJ line is stable in position. Interstitial edema is improved. No focal airspace consolidation is evident.  IMPRESSION: 1. Improved interstitial edema. 2. Stable cardiomegaly.   Electronically Signed   By: Gennette Pac M.D.   On: 09/25/2013 21:55   Dg Chest Port 1 View  09/24/2013   CLINICAL DATA:  Respiratory failure  EXAM: PORTABLE CHEST - 1 VIEW  COMPARISON:  09/23/2013  FINDINGS: Interval tracheal and esophageal extubation. Left IJ catheter in stable position, tip at the superior cavoatrial junction.  Unchanged cardiomegaly and pulmonary edema. These changes could obscure superimposed infection. No evidence of increased pleural fluid. No pneumothorax.  IMPRESSION: 1. Stable lung volumes after tracheal  extubation. 2. Unchanged pulmonary edema, which could obscure superimposed consolidation.   Electronically Signed   By: Tiburcio Pea M.D.   On: 09/24/2013 06:23   Dg Chest Port 1 View  09/23/2013   CLINICAL DATA:  Respiratory failure.  EXAM: PORTABLE CHEST - 1 VIEW  COMPARISON:  09/22/2013.  FINDINGS: Endotracheal tube, left IJ line, NG tube in stable position. Persistent cardiomegaly and pulmonary venous congestion present. Interstitial prominence present. These findings consistent with congestive heart failure. Left base atelectasis and/or infiltrate present. Left pleural effusion has improved. No pneumothorax.  IMPRESSION: 1. Stable line and tube positions. 2. Findings consistent with congestive heart failure with pulmonary interstitial edema again noted. Left pleural effusion has improved. 3. Left lower lobe atelectasis and/or infiltrate.   Electronically Signed   By: Maisie Fus  Register   On: 09/23/2013 07:36   Dg Chest Port 1 View  09/22/2013   CLINICAL DATA:  Respiratory failure.  EXAM: PORTABLE CHEST - 1 VIEW  COMPARISON:  Chest radiograph September 21, 2013  FINDINGS: Endotracheal tube tip projects 4.2 cm above the carina. Left internal jugular central venous catheter with distal tip at the cavoatrial junction. Nasogastric tube past the gastroesophageal junction region code though, tip is not visualized. No pneumothorax.  Stable cardiomegaly, worsening interstitial edema, with minimal patchy airspace opacity in left lung base, unchanged. Suspected small left pleural effusion. Soft tissue planes and included osseous structures are unchanged.  IMPRESSION: No apparent change in life-support lines.  Stable cardiomegaly with slightly worsening interstitial edema. Retrocardiac airspace opacity may reflect atelectasis or confluent edema with suspected small left pleural effusion.   Electronically Signed   By: Awilda Metro   On: 09/22/2013 06:31   Dg Chest Port 1 View  09/21/2013   CLINICAL DATA:   Respiratory distress.  Intubated patient.  EXAM: PORTABLE CHEST - 1 VIEW  COMPARISON:  09/20/2013  FINDINGS: Endotracheal tube tip lies 3 cm above the carina. Nasogastric tube passes below the diaphragm well into the stomach. Left internal jugular central venous line tip lies the caval atrial junction.  Mild hazy airspace opacity is noted in the medial right lung base. This may reflect atelectasis. Infiltrate is possible. Lungs are otherwise clear. No pleural effusion or pneumothorax  IMPRESSION: 1. Support  apparatus is stable in well positioned. 2. Mild hazy opacity in the medial right lung base, likely atelectasis. This potentially could reflect a small area of infiltrate or even asymmetric edema. Lungs are otherwise clear. Findings are similar to the prior exam allowing for differences in patient positioning and technique.   Electronically Signed   By: Amie Portlandavid  Ormond M.D.   On: 09/21/2013 12:27   Dg Chest Port 1 View  09/20/2013   CLINICAL DATA:  Post IJ placement  EXAM: PORTABLE CHEST - 1 VIEW  COMPARISON:  Prior chest x-ray 09/20/2013 at 9:19 a.m.  FINDINGS: Interval placement of a left IJ approach central venous catheter. The tip of the catheter is in good position at the superior cavoatrial junction. No evidence of complicating pneumothorax. The patient remains intubated. The tip of the endotracheal tube is 4 cm above the carina. A nasogastric tube is present, the tip lies off the field of view presumably within the stomach. External defibrillator pads and metallic artifacts overlie the chest. Stable cardiomegaly, very low inspiratory volumes and mild pulmonary edema.  IMPRESSION: 1. New left IJ approach central venous catheter with the tip at the superior cavoatrial junction. No evidence of pneumothorax or other complication. 2. Otherwise, no significant interval change in the appearance of the chest.   Electronically Signed   By: Malachy MoanHeath  McCullough M.D.   On: 09/20/2013 11:48   Dg Chest Portable 1  View  09/20/2013   CLINICAL DATA:  Status post intubation.  EXAM: PORTABLE CHEST - 1 VIEW  COMPARISON:  None.  FINDINGS: 0919 hrs. Endotracheal tube tip is 3.8 cm above the base of the carina. The NG tube passes into the stomach although the distal tip position is not included on the film. Lung volumes are low. The cardio pericardial silhouette is enlarged. Diffuse interstitial opacity is associated with vascular congestion. Telemetry leads overlie the chest. No evidence for pneumothorax or substantial pleural effusion.  IMPRESSION: Low volume film with enlargement of the cardiopericardial silhouette. Vascular congestion with interstitial opacities suggest associated edema.   Electronically Signed   By: Kennith CenterEric  Mansell M.D.   On: 09/20/2013 09:30    Medications: Scheduled Meds: . acetaminophen  500 mg Oral TID  . antiseptic oral rinse  15 mL Mouth Rinse QID  . chlorhexidine  15 mL Mouth Rinse BID  . darbepoetin (ARANESP) injection - DIALYSIS  100 mcg Intravenous Q Wed-HD  . doxercalciferol  8 mcg Intravenous Q M,W,F-HD  . enoxaparin (LOVENOX) injection  30 mg Subcutaneous Q24H  . famotidine  20 mg Oral QHS  . hydrALAZINE  25 mg Oral Q8H  . multivitamin  1 tablet Oral QHS  . sevelamer carbonate  1,600 mg Oral TID WC      LOS: 10 days   Bernard Slayden M.D. [facility] 09/30/2013, 11:34 AM Pager: [phone]  If 7PM-7AM, please contact night-coverage [email or web address] Password [email or web address]

## 2013-09-30 NOTE — Progress Notes (Signed)
I am familiar with the patient from last months admission on Un[facility] call.  She was identified to have a small duodenal ulcer as well as some larger antral ulcers after a bleed from coumadin in December 2014.  She was admitted with PEA during dialysis this admission.  The patient is in very poor health.  A cerebral angiogram was performed and it revealed significant atherosclerosis.  She requires anticoagulation, but there is concern with her ulcerations in the upper GI tract.  There risks of a stroke are much higher than a bleed.  Again, she did bleed while on coumadin, but in light of the current findings I would proceed with anticoagulation.  There is no easy answer for this patient and I expressed my concerns and reservations with the patient and her daughter.  They understand the gravity of the situation and they want to start anticoagulation.    Recommendation: ) Start anticoagulation. 2) Transfuse as necessary. 3) Stenting per Dr. Corliss Skainseveshwar.

## 2013-10-01 DIAGNOSIS — I639 Cerebral infarction, unspecified: Secondary | ICD-10-CM | POA: Diagnosis present

## 2013-10-01 DIAGNOSIS — I10 Essential (primary) hypertension: Secondary | ICD-10-CM

## 2013-10-01 DIAGNOSIS — I6350 Cerebral infarction due to unspecified occlusion or stenosis of unspecified cerebral artery: Secondary | ICD-10-CM

## 2013-10-01 DIAGNOSIS — I480 Paroxysmal atrial fibrillation: Secondary | ICD-10-CM | POA: Diagnosis not present

## 2013-10-01 DIAGNOSIS — J9600 Acute respiratory failure, unspecified whether with hypoxia or hypercapnia: Secondary | ICD-10-CM

## 2013-10-01 DIAGNOSIS — G9340 Encephalopathy, unspecified: Secondary | ICD-10-CM

## 2013-10-01 DIAGNOSIS — I4891 Unspecified atrial fibrillation: Secondary | ICD-10-CM

## 2013-10-01 DIAGNOSIS — N186 End stage renal disease: Secondary | ICD-10-CM

## 2013-10-01 LAB — GLUCOSE, CAPILLARY
GLUCOSE-CAPILLARY: 125 mg/dL — AB (ref 70–99)
Glucose-Capillary: 124 mg/dL — ABNORMAL HIGH (ref 70–99)
Glucose-Capillary: 157 mg/dL — ABNORMAL HIGH (ref 70–99)

## 2013-10-01 MED ORDER — DOXERCALCIFEROL 4 MCG/2ML IV SOLN
INTRAVENOUS | Status: AC
  Administered 2013-10-01: 8 ug via INTRAVENOUS
  Filled 2013-10-01: qty 4

## 2013-10-01 MED ORDER — DARBEPOETIN ALFA-POLYSORBATE 100 MCG/0.5ML IJ SOLN
100.0000 ug | INTRAMUSCULAR | Status: DC
  Administered 2013-10-06: 100 ug via INTRAVENOUS
  Filled 2013-10-01: qty 0.5

## 2013-10-01 MED ORDER — DIPHENHYDRAMINE HCL 12.5 MG/5ML PO ELIX
12.5000 mg | ORAL_SOLUTION | Freq: Four times a day (QID) | ORAL | Status: DC | PRN
  Filled 2013-10-01: qty 10

## 2013-10-01 MED ORDER — AMIODARONE HCL IN DEXTROSE 360-4.14 MG/200ML-% IV SOLN
60.0000 mg/h | INTRAVENOUS | Status: DC
  Filled 2013-10-01: qty 200

## 2013-10-01 MED ORDER — ALBUMIN HUMAN 25 % IV SOLN
INTRAVENOUS | Status: AC
  Administered 2013-10-01: 12.5 g via INTRAVENOUS
  Filled 2013-10-01: qty 50

## 2013-10-01 MED ORDER — NEPRO/CARBSTEADY PO LIQD: 237.0000 mL | ORAL | Status: DC | PRN

## 2013-10-01 MED ORDER — AMIODARONE HCL IN DEXTROSE 360-4.14 MG/200ML-% IV SOLN
30.0000 mg/h | INTRAVENOUS | Status: DC
  Filled 2013-10-01: qty 200

## 2013-10-01 MED ORDER — TRAMADOL HCL 50 MG PO TABS: 50.0000 mg | ORAL_TABLET | Freq: Three times a day (TID) | ORAL | Status: DC | PRN

## 2013-10-01 MED ORDER — CHLORHEXIDINE GLUCONATE 0.12 % MT SOLN
15.0000 mL | Freq: Two times a day (BID) | OROMUCOSAL | Status: DC
  Filled 2013-10-01 (×2): qty 15

## 2013-10-01 MED ORDER — HEPARIN SODIUM (PORCINE) 1000 UNIT/ML DIALYSIS: 20.0000 [IU]/kg | INTRAMUSCULAR | Status: DC | PRN

## 2013-10-01 MED ORDER — DOXERCALCIFEROL 4 MCG/2ML IV SOLN
8.0000 ug | INTRAVENOUS | Status: DC
  Administered 2013-10-04 – 2013-10-06 (×2): 8 ug via INTRAVENOUS
  Administered 2013-10-09 – 2013-10-11 (×3): 4 ug via INTRAVENOUS
  Administered 2013-10-13 – 2013-10-15 (×2): 8 ug via INTRAVENOUS
  Filled 2013-10-01 (×6): qty 4

## 2013-10-01 MED ORDER — FLEET ENEMA 7-19 GM/118ML RE ENEM
1.0000 | ENEMA | Freq: Once | RECTAL | Status: AC | PRN
  Filled 2013-10-01: qty 1

## 2013-10-01 MED ORDER — PROCHLORPERAZINE 25 MG RE SUPP
12.5000 mg | Freq: Four times a day (QID) | RECTAL | Status: DC | PRN
  Filled 2013-10-01: qty 1

## 2013-10-01 MED ORDER — RENA-VITE PO TABS
1.0000 | ORAL_TABLET | Freq: Every day | ORAL | Status: DC
  Administered 2013-10-01 – 2013-10-14 (×13): 1 via ORAL
  Filled 2013-10-01 (×15): qty 1

## 2013-10-01 MED ORDER — MIDODRINE HCL 5 MG PO TABS
ORAL_TABLET | ORAL | Status: AC
  Administered 2013-10-01: 5 mg via ORAL
  Filled 2013-10-01: qty 1

## 2013-10-01 MED ORDER — ASPIRIN 81 MG PO CHEW
81.0000 mg | CHEWABLE_TABLET | Freq: Every day | ORAL | Status: DC
  Administered 2013-10-02 – 2013-10-07 (×6): 81 mg via ORAL
  Filled 2013-10-01 (×7): qty 1

## 2013-10-01 MED ORDER — ALUMINUM HYDROXIDE GEL 320 MG/5ML PO SUSP
15.0000 mL | Freq: Four times a day (QID) | ORAL | Status: DC | PRN
  Filled 2013-10-01: qty 30

## 2013-10-01 MED ORDER — ALBUMIN HUMAN 25 % IV SOLN
12.5000 g | Freq: Once | INTRAVENOUS | Status: AC
  Administered 2013-10-01: 12.5 g via INTRAVENOUS

## 2013-10-01 MED ORDER — FAMOTIDINE 20 MG PO TABS
20.0000 mg | ORAL_TABLET | Freq: Every day | ORAL | Status: DC
  Administered 2013-10-01 – 2013-10-14 (×13): 20 mg via ORAL
  Filled 2013-10-01 (×15): qty 1

## 2013-10-01 MED ORDER — PROCHLORPERAZINE MALEATE 5 MG PO TABS
5.0000 mg | ORAL_TABLET | Freq: Four times a day (QID) | ORAL | Status: DC | PRN
  Filled 2013-10-01: qty 2

## 2013-10-01 MED ORDER — RESOURCE THICKENUP CLEAR PO POWD
ORAL | Status: DC | PRN
  Filled 2013-10-01: qty 125

## 2013-10-01 MED ORDER — CLOPIDOGREL BISULFATE 75 MG PO TABS
75.0000 mg | ORAL_TABLET | Freq: Every day | ORAL | Status: DC
  Administered 2013-10-02 – 2013-10-06 (×5): 75 mg via ORAL
  Filled 2013-10-01 (×5): qty 1

## 2013-10-01 MED ORDER — ENOXAPARIN SODIUM 30 MG/0.3ML ~~LOC~~ SOLN: 30.0000 mg | SUBCUTANEOUS | Status: DC

## 2013-10-01 MED ORDER — ACETAMINOPHEN 325 MG PO TABS
325.0000 mg | ORAL_TABLET | ORAL | Status: DC | PRN
Start: 2013-10-01 — End: 2013-10-15
  Administered 2013-10-08: 650 mg via ORAL
  Filled 2013-10-01: qty 2

## 2013-10-01 MED ORDER — MIDODRINE HCL 5 MG PO TABS
5.0000 mg | ORAL_TABLET | ORAL | Status: DC
  Administered 2013-10-01: 5 mg via ORAL

## 2013-10-01 MED ORDER — AMIODARONE LOAD VIA INFUSION
150.0000 mg | Freq: Once | INTRAVENOUS | Status: DC
  Filled 2013-10-01: qty 83.34

## 2013-10-01 MED ORDER — BIOTENE DRY MOUTH MT LIQD: 15.0000 mL | Freq: Four times a day (QID) | OROMUCOSAL | Status: DC

## 2013-10-01 MED ORDER — BISACODYL 10 MG RE SUPP
10.0000 mg | Freq: Every day | RECTAL | Status: DC | PRN
  Filled 2013-10-01: qty 1

## 2013-10-01 MED ORDER — PROCHLORPERAZINE EDISYLATE 5 MG/ML IJ SOLN
5.0000 mg | Freq: Four times a day (QID) | INTRAMUSCULAR | Status: DC | PRN
  Filled 2013-10-01: qty 2

## 2013-10-01 MED ORDER — GUAIFENESIN-DM 100-10 MG/5ML PO SYRP
5.0000 mL | ORAL_SOLUTION | Freq: Four times a day (QID) | ORAL | Status: DC | PRN
  Filled 2013-10-01: qty 10

## 2013-10-01 MED ORDER — SEVELAMER CARBONATE 800 MG PO TABS
1600.0000 mg | ORAL_TABLET | Freq: Three times a day (TID) | ORAL | Status: DC
  Administered 2013-10-02 – 2013-10-15 (×29): 1600 mg via ORAL
  Filled 2013-10-01 (×44): qty 2

## 2013-10-01 MED ORDER — MIDODRINE HCL 5 MG PO TABS
5.0000 mg | ORAL_TABLET | ORAL | Status: DC
  Administered 2013-10-04 – 2013-10-15 (×6): 5 mg via ORAL
  Filled 2013-10-01 (×7): qty 1

## 2013-10-01 NOTE — PMR Pre-admission (Signed)
PMR Admission Coordinator Pre-Admission Assessment  Patient: Briana Gould is an 68 y.o., female MRN: [id number] DOB: 1945-09-13 Height: 5\' 2"  (157.5 cm) Weight: 80 kg (176 lb 5.9 oz)              Insurance Information HMO:     PPO:      PCP:      IPA:      80/20: yes     OTHER: no HMO PRIMARY: Medicare a and b      Policy#: [id number]      Subscriber: pt Benefits:  Phone #: online     Name: 09/30/13 Eff. Date: 09/09/10     Deduct: $1260      Out of Pocket Max: none      Gould Max: none CIR: 100%      SNF: 20 full days Outpatient: 80%     Co-Pay: 20% Home Health: 100%      Co-Pay: none DME: 80%     Co-Pay: 20% Providers: pt choice  SECONDARY: [facility]      Policy#: [id number]      Subscriber: Briana Gould  THIRD: [id number] Briana Gould  FOURTH: Briana Gould [id number] [facility]  Medicaid Application Date:       Case Manager:  Disability Application Date:       Case Worker:   Emergency Contact Information Contact Information   Name Relation Home Work Mobile   McMullinValdes Gould,Briana Daughter [phone]  [phone]   Soundra PilonValdes,Briana Spouse [phone]       Current Medical History  Patient Admitting Diagnosis: Deconditioning secondary to cardiac arrest in a patient with end-stage renal disease  History of Present Illness: Analeah [name] is a 68 y.o. 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 Patient did receive CPR x20 minutes. Cranial CT scan showed age uncertain a small infarct mid pons. Small infarct in this area felt to be possibly recent. Echocardiogram ejection fraction of 55%. Patient remained intubated.  There  is no ST segment elevation on EKG. MRA showed significant intracranial atherosclerotic type changes. Carotid Dopplers 60-79% right ICA stenosis. Patient not felt to be a surgical intervention candidate. EEG showed no seizure activity. 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 Hemodialysis ongoing as per renal services. Patient was extubated 09/23/2013. 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 60% stenosis of right ICA approximately and 50% stenosis left ICA petrous segment. Currently maintained on subcutaneous Lovenox for DVT prophylaxis. Await plan to begin aspirin Plavix to be discussed with gastroenterology services after recent GI bleed. Physical therapy evaluation completed 09/26/2013 with slow progressive gains. M.D. as requested physical medicine rehabilitation consult to consider inpatient rehabilitation services.  Patient stated she was tired today but feels more awake now and was hoping she can get some therapy at this time  Prior to admission was independent using a prosthetic device for left BKA amputation, she has this here with her today.       Past Medical History  Past Medical History  Diagnosis Date  . End stage renal disease on dialysis   . Peripheral arterial occlusive disease   .  Hypertension   . Cardiac arrest   . Diabetes mellitus type 2, uncontrolled, with complications   . DVT (deep venous thrombosis)      Right internal jugular vein  . CAD (coronary artery disease), native coronary artery 2011    Diffuse distal vessel CAD noted at cardiac catheterization    Family History  family history is not on file.  Prior Rehab/Hospitalizations: pt states inpt rehab  Current Medications  Current facility-administered medications:0.9 %  sodium chloride infusion, , Intravenous, Continuous PRN, Merwyn Katos, MD,  Last Rate: 7 mL/hr at 09/22/13 1711;  0.9 %  sodium chloride infusion, 100 mL, Intravenous, PRN, Maree Krabbe, MD;  0.9 %  sodium chloride infusion, 100 mL, Intravenous, PRN, Maree Krabbe, MD;  0.9 %  sodium chloride infusion, , Intravenous, Continuous, D Kevin Allred, PA-C 0.9 %  sodium chloride infusion, 100 mL, Intravenous, PRN, Maree Krabbe, MD;  0.9 %  sodium chloride infusion, 100 mL, Intravenous, PRN, Maree Krabbe, MD;  acetaminophen (TYLENOL) tablet 500 mg, 500 mg, Oral, TID, Ripudeep K Rai, MD, 500 mg at 09/30/13 2142;  acetaminophen (TYLENOL) tablet 650 mg, 650 mg, Oral, Q4H PRN, Storm Frisk, MD, 650 mg at 09/25/13 1096 antiseptic oral rinse (BIOTENE) solution 15 mL, 15 mL, Mouth Rinse, QID, Merwyn Katos, MD, 15 mL at 09/30/13 1200;  aspirin chewable tablet 81 mg, 81 mg, Oral, Daily, Belkys A Regalado, MD;  chlorhexidine (PERIDEX) 0.12 % solution 15 mL, 15 mL, Mouth Rinse, BID, Merwyn Katos, MD, 15 mL at 09/30/13 1124;  [START ON 10/02/2013] clopidogrel (PLAVIX) tablet 75 mg, 75 mg, Oral, Q breakfast, Belkys A Regalado, MD darbepoetin (ARANESP) injection 100 mcg, 100 mcg, Intravenous, Q Wed-HD, Maree Krabbe, MD, 100 mcg at 09/29/13 0931;  doxercalciferol (HECTOROL) injection 8 mcg, 8 mcg, Intravenous, Q M,W,F-HD, Maree Krabbe, MD, 8 mcg at 10/01/13 1033;  enoxaparin (LOVENOX) injection 30 mg, 30 mg, Subcutaneous, Q24H, Merwyn Katos, MD, 30 mg at 09/27/13 0454;  famotidine (PEPCID) tablet 20 mg, 20 mg, Oral, QHS, Merwyn Katos, MD, 20 mg at 09/30/13 2143 feeding supplement (NEPRO CARB STEADY) liquid 237 mL, 237 mL, Oral, PRN, Maree Krabbe, MD;  feeding supplement (NEPRO CARB STEADY) liquid 237 mL, 237 mL, Oral, PRN, Maree Krabbe, MD;  heparin injection 1,000 Units, 1,000 Units, Dialysis, PRN, Maree Krabbe, MD;  heparin injection 1,000 Units, 1,000 Units, Dialysis, PRN, Maree Krabbe, MD;  heparin injection 1,600 Units, 20 Units/kg, Dialysis, PRN,  Irena Cords, MD hydrALAZINE (APRESOLINE) injection 20 mg, 20 mg, Intravenous, Q4H PRN, Simonne Martinet, NP, 20 mg at 09/24/13 0046;  hydrALAZINE (APRESOLINE) tablet 25 mg, 25 mg, Oral, Q8H, Maree Krabbe, MD, 25 mg at 09/30/13 2142;  lidocaine (PF) (XYLOCAINE) 1 % injection 5 mL, 5 mL, Intradermal, PRN, Maree Krabbe, MD;  lidocaine (PF) (XYLOCAINE) 1 % injection 5 mL, 5 mL, Intradermal, PRN, Maree Krabbe, MD lidocaine-prilocaine (EMLA) cream 1 application, 1 application, Topical, PRN, Maree Krabbe, MD;  lidocaine-prilocaine (EMLA) cream 1 application, 1 application, Topical, PRN, Maree Krabbe, MD;  Melene Muller ON 10/04/2013] midodrine (PROAMATINE) tablet 5 mg, 5 mg, Oral, Q M,W,F-HD, Irena Cords, MD, 5 mg at 10/01/13 1218;  multivitamin (RENA-VIT) tablet 1 tablet, 1 tablet, Oral, QHS, Sheffield Slider, PA-C, 1 tablet at 09/30/13 2142 ondansetron [facility]) injection 4 mg, 4 mg, Intravenous, Q6H PRN, Nelda Bucks, MD, 4 mg at 09/25/13 0446;  pentafluoroprop-tetrafluoroeth (GEBAUERS)  aerosol 1 application, 1 application, Topical, PRN, Maree Krabbe, MD;  pentafluoroprop-tetrafluoroeth Peggye Pitt) aerosol 1 application, 1 application, Topical, PRN, Maree Krabbe, MD;  RESOURCE THICKENUP CLEAR, , Oral, PRN, Merwyn Katos, MD sevelamer carbonate (RENVELA) tablet 1,600 mg, 1,600 mg, Oral, TID WC, Maree Krabbe, MD, 1,600 mg at 09/30/13 1125;  sodium chloride 0.9 % injection 10-40 mL, 10-40 mL, Intracatheter, PRN, Belkys A Regalado, MD, 10 mL at 09/29/13 1430;  traMADol (ULTRAM) tablet 50 mg, 50 mg, Oral, Q8H PRN, Ripudeep K Rai, MD, 50 mg at 09/27/13 1241  Patients Current Diet: Renal  Precautions / Restrictions Precautions Precautions: Fall Other Brace/Splint: Lt prosthesis Restrictions Weight Bearing Restrictions: No   Prior Activity Level Community (5-7x/wk): pt drove self to dialysis 3 times per week, caregiver to 3 grandchildren 8, 32, and 14 yo  Occupational psychologist / Corporate investment banker Devices/Equipment: Paediatric nurse with back;Wheelchair;Walker (specify type);Cane (specify quad or straight);Eyeglasses;Prosthesis Home Equipment: Walker - 2 wheels;Wheelchair - manual;Shower seat - built in;Bedside commode  Prior Functional Level Prior Function Level of Independence: Independent Comments: PTA pt and sister reports that pt was independent with mobility and ADLs with Lt prosthesis   Current Functional Level Cognition  Overall Cognitive Status: Within Functional Limits for tasks assessed Current Attention Level: Alternating Orientation Level: Oriented X4 Following Commands: Follows one step commands with increased time Safety/Judgement: Decreased awareness of deficits;Decreased awareness of safety General Comments: pt's cognitive status has improved significantly    Extremity Assessment (includes Sensation/Coordination)        Bed Mobility  Overal bed mobility: Needs Assistance  Bed Mobility: Supine to Sit;Sit to Supine  Supine to sit: Min assist  Sit to supine: Min assist  General bed mobility comments: HOB nearly flat, one hand assist to get to edge of bed, min A to right LE to return to supine  Transfers  Overall transfer level: Needs assistance  Equipment used: Rolling walker (2 wheeled)  Transfers: Sit to/from [employer]  Sit to Stand: Min assist  Stand pivot transfers: +2 safety/equipment;Min assist  General transfer comment: pt stood with one hand on bed and one on RW, +2 present for safety but required only min assist to achieve standing. Pivot turn to [facility] with RW with +2 assist for safety and from [facility] to bed toward right side with min A only.  Ambulation/Gait  Ambulation/Gait assistance: (NT)  General Gait Details: pt felt she was too fatigued to din prosthesis to ambulate   Exercises  General Exercises - Lower Extremity  Straight Leg Raises: AROM;Both;10 reps;Supine       ADLs        Mobility       Transfers       Ambulation / Gait / Stairs / Wheelchair Mobility  Ambulation/Gait General Gait Details: pt felt she was too  fatigued to din prosthesis to ambulate    Posture / Balance Dynamic Sitting Balance Sitting balance - Comments: sitting balance improved    Special needs/care consideration Dialysis yes        Days MWF Bowel mgmt: continent Bladder mgmt:continent   Previous Home Environment Living Arrangements: Spouse/significant other;Other relatives;Other (Comment) (pt has custody of 8, 10 and 71 yo grandchildren)  Lives With: Spouse;Family;Other (Comment) (3 grandchildren she has custody of) Available Help at Discharge: Other (Comment) (Housekeeper 8 hrs per day mon thru friday. Daughter, [name] pr) Type of Home: House Home Layout: One level Home Access: Ramped entrance Bathroom Shower/Tub: Engineer, manufacturing systems: Standard  Bathroom Accessibility: Yes How Accessible: Accessible via wheelchair Home Care Services: No Type of Home Care Services: Home PT;Home RN Home Care Agency (if known): [facility] Additional Comments: husband is disabled and cannot provide physical (A)   Discharge Living Setting Plans for Discharge Living Setting: Patient's home;Lives with (comment);Other (Comment) (spouse and 3 grandchildren) Type of Home at Discharge: House Discharge Home Layout: One level Discharge Home Access: Ramped entrance Discharge Bathroom Shower/Tub: Tub/shower unit Discharge Bathroom Toilet: Standard Discharge Bathroom Accessibility: Yes How Accessible: Accessible via wheelchair Does the patient have any problems obtaining your medications?: No  Social/Family/Support Systems Patient Roles: Spouse;Parent;Caregiver;Other (Comment) (custody of her 3 grandchildren) Contact Information: Mycah Mcdougall, daughter Anticipated Caregiver: [name] prn, spouse no assist, pt's brother down to help from [place] Anticipated Caregiver's Contact Information: see  above Ability/Limitations of Caregiver: supervision but not 24/7 unless brother stays in town Caregiver Availability: Other (Comment) Discharge Plan Discussed with Primary Caregiver: Yes Is Caregiver In Agreement with Plan?: Yes Does Caregiver/Family have Issues with Lodging/Transportation while Pt is in Rehab?: No    Goals/Additional Needs Patient/Family Goal for Rehab: Mod I with PT, OT, and SLP Expected length of stay: ELOS 7 to 9 days Dietary Needs: renal diet Special Service Needs: plan for angiogram and possible stent at end of rehab stay ELSO 7 to 9 days. [facility] has contacted [facility] nurse in IR to possible plan for next Friday. Will need to be on Plavix and Asa for 5 days prior to procedure. Pt/Family Agrees to Admission and willing to participate: Yes Program Orientation Provided & Reviewed with Pt/Caregiver Including Roles  & Responsibilities: Yes   Decrease burden of Care through IP rehab admission: n/a  Possible need for SNF placement upon discharge: n/a  Patient Condition: This patient's condition remains as documented in the consult dated 09/30/13, in which the Rehabilitation Physician determined and documented that the patient's condition is appropriate for intensive rehabilitative care in an inpatient rehabilitation facility. Will admit to inpatient rehab today.  Preadmission Screen Completed By:  Clois Dupes, 10/01/2013 12:19 PM ______________________________________________________________________   Discussed status with Dr. Riley Kill  On  10/01/13 at 1218 and received telephone approval for admission today.  Admission Coordinator:  Clois Dupes, time 1610 Date 10/01/13.

## 2013-10-01 NOTE — Progress Notes (Addendum)
68 y.o. female w/ PMHx significant for ESRD who presented to [facility] on 09/20/2013 with recurrent PEA cardiac arrest. No medications were administered. She had return of spontaneous circulation with resuscitative efforts.  She has had syncope and collapse in the past and was just hospitalized in December 2014. At that time she had marked bradycardia and required dopamine. She was seen by our electrophysiology service and thought to be a very poor candidate for permanent pacemaker. This was in the setting of end-stage renal disease, right internal jugular vein DVT, left arm AV fistula, and subclavian catheter for dialysis. Recent Echo shows normal LV function with mild AS and mild MR. PEA arrests were not felt to be primarily cardiac. Subsequent evaluation demonstrates severe intra and extracranial carotid disease. Patient reports she is planning to have stent place. While in dialysis today she went into Afib with RVR associated with drop in BP. BP now improved but Afib sustained. No known history of Afib. Patient with prior GI bleed on coumadin.     TELEMETRY: Reviewed telemetry pt in atrial fibrillation with RVR rate 140s: Filed Vitals:   10/01/13 1212 10/01/13 1221 10/01/13 1324 10/01/13 1419  BP: 93/51 109/48 101/65 120/96  Pulse: 101 102 119   Temp:   98.6 F (37 C)   TempSrc:   Oral   Resp:   18   Height:      Weight:      SpO2:   100%     Intake/Output Summary (Last 24 hours) at 10/01/13 1455 Last data filed at 10/01/13 1150  Gross per 24 hour  Intake    240 ml  Output   1383 ml  Net  -1143 ml    SUBJECTIVE She states she could feel it when her BP was low. No palpitations, chest pain, or SOB.  LABS: Basic Metabolic Panel:  Recent Labs  96/04/54 0805  NA 139  K 4.9  CL 100  CO2 25  GLUCOSE 141*  BUN 22  CREATININE 5.35*  CALCIUM 9.8  PHOS 3.4   Liver Function Tests:  Recent Labs  09/29/13 0805  ALBUMIN 3.0*   No results found for this  basename: LIPASE, AMYLASE,  in the last 72 hours CBC:  Recent Labs  09/29/13 0805  WBC 11.7*  HGB 8.9*  HCT 27.4*  MCV 97.9  PLT 226    Radiology/Studies:  Ct Head Wo Contrast  09/20/2013   CLINICAL DATA:  Altered mental status ; status post cardiopulmonary resuscitation  EXAM: CT HEAD WITHOUT CONTRAST  TECHNIQUE: Contiguous axial images were obtained from the base of the skull through the vertex without intravenous contrast.  COMPARISON:  None.  FINDINGS: There is age related volume loss. There is no demonstrable mass, hemorrhage, extra-axial fluid collection, or midline shift. There is mild small vessel disease in the centra semiovale bilaterally. There is decreased attenuation in the midline of the upper to mid pons which may represent a small recent infarct in this area. No other acute appearing infarct is appreciable.  Bony calvarium appears intact.  The mastoid air cells are clear.  IMPRESSION: Age uncertain small infarct mid pons. Small infarct in this area may be recent.  There is underlying age related volume loss with mild periventricular small vessel disease. There is no intracranial mass or hemorrhage. No gray-white compartment edema is appreciable on this study.   Electronically Signed   By: Bretta Bang M.D.   On: 09/20/2013 12:54   Dg Chest Port 1  View  09/25/2013   CLINICAL DATA:  Edema after dialysis.  EXAM: PORTABLE CHEST - 1 VIEW  COMPARISON:  One-view chest 09/24/2013.  FINDINGS: Cardiac enlargement is stable. A left IJ line is stable in position. Interstitial edema is improved. No focal airspace consolidation is evident.  IMPRESSION: 1. Improved interstitial edema. 2. Stable cardiomegaly.   Electronically Signed   By: Gennette Pachris  Mattern M.D.   On: 09/25/2013 21:55   Dg Chest Port 1 View  09/24/2013   CLINICAL DATA:  Respiratory failure  EXAM: PORTABLE CHEST - 1 VIEW  COMPARISON:  09/23/2013  FINDINGS: Interval tracheal and esophageal extubation. Left IJ catheter in stable  position, tip at the superior cavoatrial junction.  Unchanged cardiomegaly and pulmonary edema. These changes could obscure superimposed infection. No evidence of increased pleural fluid. No pneumothorax.  IMPRESSION: 1. Stable lung volumes after tracheal extubation. 2. Unchanged pulmonary edema, which could obscure superimposed consolidation.   Electronically Signed   By: Tiburcio PeaJonathan  Watts M.D.   On: 09/24/2013 06:23    Dg Chest Port 1 View  09/22/2013   CLINICAL DATA:  Respiratory failure.  EXAM: PORTABLE CHEST - 1 VIEW  COMPARISON:  Chest radiograph September 21, 2013  FINDINGS: Endotracheal tube tip projects 4.2 cm above the carina. Left internal jugular central venous catheter with distal tip at the cavoatrial junction. Nasogastric tube past the gastroesophageal junction region code though, tip is not visualized. No pneumothorax.  Stable cardiomegaly, worsening interstitial edema, with minimal patchy airspace opacity in left lung base, unchanged. Suspected small left pleural effusion. Soft tissue planes and included osseous structures are unchanged.  IMPRESSION: No apparent change in life-support lines.  Stable cardiomegaly with slightly worsening interstitial edema. Retrocardiac airspace opacity may reflect atelectasis or confluent edema with suspected small left pleural effusion.   Electronically Signed   By: Awilda Metroourtnay  Bloomer   On: 09/22/2013 06:31    Ir Angio Intra Extracran Sel Com Carotid Innominate Bilat Mod Sed  09/30/2013   CLINICAL DATA:  Patient with transient left-sided weakness. Abnormal MRA of the brain and neck.  EXAM: BILATERAL COMMON CAROTID AND INNOMINATE ANGIOGRAPHY AND BILATERAL VERTEBRAL ARTERY ANGIOGRAMS:  MEDICATIONS: Versed 1 mg IV. Fentanyl 25 mcg IV.  ANESTHESIA/SEDATION: Conscious sedation.  CONTRAST:  80mL OMNIPAQUE IOHEXOL 300 MG/ML  SOLN  PROCEDURE: Following a full explanation of the procedure along with the potential associated complications, an informed witnessed consent was  obtained.  The right groin was prepped and draped in the usual sterile fashion. Thereafter using modified Seldinger technique, transfemoral access into the right common femoral artery was obtained without difficulty. Over a 0.035 inch guidewire, a 5 French Pinnacle sheath was inserted. Through this and also over a 0.035 inch guidewire, a 5 French JB1 catheter was advanced into the aortic arch region and selectively positioned in the right common carotid artery, the right subclavian artery, the left common carotid artery and the left subclavian artery.  There were no acute complications. The patient tolerated the procedure well.  COMPLICATIONS: None immediate.  FINDINGS: The right common carotid arteriogram demonstrates the right external carotid artery and its major branches to be normal.  The right internal carotid artery at the bulb is patent. Just distal to the bulb there is a focal stenosis of 65% - 70%.  Distal to this the vessel opacifies normally to the cranial skull base.  There is normal opacification of the right ICA in the distal cervical segment.  Mild narrowing is seen of the cervical petrous junction and the cavernous  petrous junction.  The cavernous segment distally appears widely patent. The supraclinoid segment is also widely patent.  The right middle cerebral artery proximally just distal to its origin demonstrates a 50% stenosis. A 40% stenosis is seen of the right MCA just distal to the anterior temporal branch.  The right middle cerebral artery distribution and the right anterior cerebral artery distribution distally demonstrates normal opacification into the capillary and venous phases. Cross filling via the anterior communicating artery of the left anterior cerebral artery A2 segment is seen. Occlusion of the left anterior cerebral artery A1 segment is noted.  The right vertebral artery origin is normal.  The vessel opacifies to the cranial skull base where it terminates primarily in the  ipsilateral right posterior-inferior cerebellar artery.  No flow is seen into the right vertebrobasilar junction.  The left vertebral artery origin is widely patent.  The vessel opacifies to the cranial skull base.  There is normal opacification of the left posterior inferior cerebellar artery and the left vertebrobasilar junction.  Opacification of the basilar artery, the superior cerebellar arteries, the posterior cerebral arteries and anterior inferior cerebellar arteries is seen. Also noted is the stump of the occluded right vertebrobasilar junction.  The posterior cerebral arteries demonstrate moderate narrowing in the P2 segments and the P3 segments consistent with intracranial arteriosclerosis.  The left common carotid arteriogram demonstrates the left external carotid artery to be narrowed by 50%. Its branches are normal.  The left internal carotid artery at the bulb to the cranial skull base opacifies normally.  There is mild narrowing of the distal cervical vertical segment.  A 50% stenosis is noted of the distal horizontal segment of the petrous portion, with a 95% plus focal stenosis of the left internal carotid artery in the petrous cavernous junction.  The cavernous segment distal to this remains widely patent as does the supraclinoid segment.  The left middle cerebral artery opacifies into the capillary and venous phases.  Nonvisualization of the left anterior cerebral artery A1 segment is seen consistent with occlusion.  IMPRESSION: 95% plus focal stenoses of the left internal carotid artery petrous cavernous junction, with approximately 50% stenosis of the horizontal segment of the petrous portion of the left internal carotid artery.  Occluded right vertebrobasilar junction distal to the right posterior-inferior cerebellar artery.  50% stenosis of the right middle cerebral artery proximally. Approximately 65% percent stenosis the right internal carotid artery just distal to the bulb.    Electronically Signed   By: Julieanne Cotton M.D.   On: 09/28/2013 13:53    PHYSICAL EXAM General: Obese BF in no acute distress. Head: Normocephalic, atraumatic, sclera non-icteric, no xanthomas, nares are without discharge. Neck: positive for left carotid bruit. JVD not elevated. Lungs: Clear bilaterally to auscultation without wheezes, rales, or rhonchi. Breathing is unlabored. Heart: IRRR, tachy, S1 S2 without murmurs, rubs, or gallops.  Abdomen: Soft, obese, non-tender, non-distended with normoactive bowel sounds. No hepatomegaly. No rebound/guarding. No obvious abdominal masses. Msk:  Strength and tone appears normal for age. Extremities: left BKA, 1+ right edema Neuro: Alert and oriented X 3. Moves all extremities spontaneously. Psych:  Responds to questions appropriately with a normal affect.  ASSESSMENT AND PLAN: 1. Afib with RVR- new onset. Associated with hypotension during dialysis. 2. Recurrent PEA arrest. Etiology unclear. Possibly related to hypotension in setting of severe cerebrovascular disease. Doubt primarily bradycardia. 3. Severe cerebrovascular and carotid arterial disease. 4. ESRD on hemodialysis. Has refused peritoneal dialysis  5. History of GI bleed on  coumadin secondary to gastric ulcers 6. Anemia of CKD 7. CAD cardiac cath in past 2011showed severe distal vessel disease not amenable to PCI/ CABG 8. PAD s/p left BKA 9. HTN antihypertensive Rx on hold.  This is a very difficult situation. Rate control therapy for afib limited by hypotension. Not a candidate for Dig with ESRD. I had considered starting amiodarone to restore NSR but before any therapy administered the patient converted to NSR with a 6.5 second pause and marked bradycardia down to 38 bpm associated with Nausea, vomiting and near syncope. Now in NSR rate 90. In the absence of a pacemaker I don't think there is any safe medical therapy for her afib. If these events recur will need to re-consult  EP.  The other issue is anticoagulation. She has a very high [place] vasc score and high risk of CVA. She also has a high bleeding risk and prior GI bleed on coumadin. She is not a candidate for NOAC with ESRD. Plan for carotid stenting next week which would require DAPT. I would plan on proceeding with DAPT. Overall her prognosis is poor with multisystem major illnesses and recurrent PEA arrests.   We will follow with you.  Principal Problem:   Cardiac arrest Active Problems:   ESRD (end stage renal disease)   Acute respiratory failure with hypoxia   Hypertension   Encephalopathy acute   H/O: GI bleed   CVA (cerebral infarction)    Signed, Peter [place] MD,FACC 10/01/2013\ 3:17 PM

## 2013-10-01 NOTE — Progress Notes (Signed)
Pt not feeling well on arrival from dialysis, pt HR found to be 120's-140's Afib. Pt appears to have gone into ST to Afib in dialysis. Dr. Sunnie Nielsenegalado notified. Cardiology consulted. Pt blood pressure soft, but stable. Continue to monitor. Emelda Brothershristy Chloe Flis RN

## 2013-10-01 NOTE — Progress Notes (Signed)
[name], female   DOB: [birth year], 68 y.o.   MRN: [id number]   Pt with cerebral vascular disease Cerebral arteriogram 1/20 revealed L ICA 95% stenosis                                                          R ICA 60% stenosis                                                           Occluded Rt VBJ Consultation with Dr Corliss Skainseveshwar-  treatments were discussed  Pt now tentatively scheduled for L ICA angioplasty/stent placement 1/28 or 1/29 She is aware and agreeable  We will start pt on ASA and Plavix on Sat 1/24 GI note has deemed pt able to start these as necessary due to risk of stroke is high as compared to minimal risk of GI bleed.  I have also discussed case and plans with Dr Abel Prestoolodonato He is agreeable  See orders

## 2013-10-01 NOTE — Progress Notes (Signed)
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 Readmitted for PEA arrest while on dialysis.   SIGNIFICANT EVENTS / STUDIES:  1/12 cardiac arrest/ PEA at HD. 20 minutes estimated to ROSC  1/12 Echocardiogram: LVEF 50-55%. No RWMAs. Moderate LVH. Probably mild AS. Mild MR. RVSP est 69 mmHg  1/12 CT head: Age uncertain small infarct mid pons. Small infarct in this area may be recent.  1/13 Cognition much improved. Failed SBT  1/15 Extubated successfully  1/20: Cerebral angiogram done   Assessment/Plan:  -A fib: RVR; Patient Blood pressure decrease during dialysis. HR increased. She received IV bolus. Will repeat BP. Will probably give IV lopressor. Cardiology consulted. She was on coumadin at some point but this was stop due to bleed. Difficult situation with anticoagulation.   -Cardiac arrest (PEA)  - multiple times - etiology not fully determined,  question carotid stenosis, severe pulm HTN or severe CAD may be contributing when she is becoming hypotensivie during dialysis sessions  - Dr. Arlean Hopping has asked for interventional neurology consult to evaluate carotid stenosis, cerebral angiogram was scheduled today but patient refused. I have asked IR cerebral angiogram can be rescheduled to tomorrow morning.   - patient is not interested in PD at this time, d/w Dr.Colodonato, plan to have hemodialysis done  tomorrow and cont further at SNF MWF.  -No further cardiology evaluation per cardio.   -Chest pain: Musculoskeletal likely due to CPR with chest wall tenderness improving:  - Placed her on tramadol as needed and Tylenol 500mg  TID.   -ESRD (end stage renal disease)  - per nephrology.   -Acute respiratory failure with hypoxia/ Pulmonary edema/ diastolic CHF  - s/p arrest  - nephrology team to manage fluid balance   Carotid artery stenosis - 1-39% stenosis involving the left internal carotid artery. - Findings consistent with 60-79% right ICA stenosis  - interventional radiology has been consulted, cerebral angiogram done today by Dr. Corliss Skains.  Findings. Severe stenosis of LT ICA pet cavernous junction of 95% , 60 % stenosis of RT ICA prox.. 50 % steosis of LT ICA petrous seg. Occluded RT VBJ -Plan for intracranial angiogram and or possible stent. Dr Roney Jaffe agree with plavix and aspirin prior to procedure. Monitor CBC.  Patient needs to be on plavix and aspirin for procedure for at least 5 days.    Severe Pulmonary HTN with moderate TR, CAD  - cath in 2011 revealed diffuse distal disease   Right IJ thrombus  -found on a previous admission- not a candidate for anticoagulation due to PUD with bleed in 12/14 in setting of Coumadin   PUD - cont Pepcid   Hypertension  - Hydralazine resumed by nephrology   Encephalopathy acute - likely due to anoxia secondary to cardiac arrest- may have new cognitive baseline-  - Levaquin started on 1/16- continue for 5 days .  Finished course.  -Resolved.   Severe PAD with left BKA  - family brought in prosthesis in to allow pt to ambulate   DVT Prophylaxis:  Code Status: Full code  Family Communication:   Disposition: hold on transfer to CIR.     Subjective: She denies chest pain, SOB. She relates stomach upset. Wants to eat something. She relates right side abdominal pain.    Objective: Weight change:   Intake/Output Summary  (Last 24 hours) at 10/01/13 1258 Last data filed at 10/01/13 1150  Gross per 24 hour  Intake    240 ml  Output   1383 ml  Net  -1143 ml   Blood pressure 109/48, pulse 102, temperature 98.3 F (36.8 C), temperature source Oral, resp. rate 17, height 5\' 2"  (1.575 m), weight 80 kg (176 lb 5.9 oz), SpO2 100.00%.  Physical Exam: General: Alert and awake, oriented CVS: S1-S2 clear Chest: CTAB Abdomen: soft nontender, nondistended, normal bowel sounds  Extremities: no cyanosis, clubbing left BKA,   Lab Results: Basic Metabolic Panel:  Recent Labs Lab 09/28/13 0650 09/29/13 0805  NA 142 139  K 4.7 4.9  CL 101 100  CO2 27 25  GLUCOSE 148* 141*  BUN 15 22  CREATININE 3.87* 5.35*  CALCIUM 9.6 9.8  PHOS  --  3.4   Liver Function Tests:  Recent Labs Lab 09/26/13 1337 09/29/13 0805  ALBUMIN 3.3* 3.0*   No results found for this basename: LIPASE, AMYLASE,  in the last 168 hours No results found for this basename: AMMONIA,  in the last 168 hours CBC:  Recent Labs Lab 09/27/13 0445 09/28/13 1405 09/29/13 0805  WBC 8.7  --  11.7*  HGB 9.6* 9.2* 8.9*  HCT 30.3* 28.9* 27.4*  MCV 96.8  --  97.9  PLT 204  --  226   Cardiac Enzymes: No results found for this basename: CKTOTAL, CKMB, CKMBINDEX, TROPONINI,  in the last 168 hours BNP: No components found with this basename: POCBNP,  CBG:  Recent Labs Lab 09/29/13 2203 09/30/13 0804 09/30/13 1148 09/30/13 1654 09/30/13 2122  GLUCAP 146* 135* 151* 153* 215*     Micro Results: No results found for this or any previous visit (from the past 240 hour(s)).  Studies/Results: Ct Head Wo Contrast  09/20/2013   CLINICAL DATA:  Altered mental status ; status post cardiopulmonary resuscitation  EXAM: CT HEAD WITHOUT CONTRAST  TECHNIQUE: Contiguous axial images were obtained from the base of the skull through the vertex without intravenous contrast.  COMPARISON:  None.  FINDINGS: There is age related volume loss. There is no  demonstrable mass, hemorrhage, extra-axial fluid collection, or midline shift. There is mild small vessel disease in the centra semiovale bilaterally. There is decreased attenuation in the midline of the upper to mid pons which may represent a small recent infarct in this area. No other acute appearing infarct is appreciable.  Bony calvarium appears intact.  The mastoid air cells are clear.  IMPRESSION: Age uncertain small infarct mid pons. Small infarct in this area may be recent.  There is underlying age related volume loss with mild periventricular small vessel disease. There is no intracranial mass or hemorrhage. No gray-white compartment edema is appreciable on this study.   Electronically Signed   By: Bretta BangWilliam  Woodruff M.D.   On: 09/20/2013 12:54   Dg Chest Port 1 View  09/25/2013   CLINICAL DATA:  Edema after dialysis.  EXAM: PORTABLE CHEST - 1 VIEW  COMPARISON:  One-view chest 09/24/2013.  FINDINGS: Cardiac enlargement is stable. A left IJ line is stable in position. Interstitial edema is improved. No focal airspace consolidation is evident.  IMPRESSION: 1. Improved interstitial edema. 2. Stable cardiomegaly.   Electronically Signed   By: Gennette Pac M.D.   On: 09/25/2013 21:55   Dg Chest Port 1 View  09/24/2013   CLINICAL DATA:  Respiratory failure  EXAM: PORTABLE CHEST - 1 VIEW  COMPARISON:  09/23/2013  FINDINGS: Interval tracheal and esophageal extubation. Left IJ catheter in stable position, tip at the superior cavoatrial junction.  Unchanged cardiomegaly and pulmonary edema. These changes could obscure superimposed infection. No evidence of increased pleural fluid. No pneumothorax.  IMPRESSION: 1. Stable lung volumes after tracheal extubation. 2. Unchanged pulmonary edema, which could obscure superimposed consolidation.   Electronically Signed   By: Tiburcio Pea M.D.   On: 09/24/2013 06:23   Dg Chest Port 1 View  09/23/2013   CLINICAL DATA:  Respiratory failure.  EXAM: PORTABLE CHEST - 1  VIEW  COMPARISON:  09/22/2013.  FINDINGS: Endotracheal tube, left IJ line, NG tube in stable position. Persistent cardiomegaly and pulmonary venous congestion present. Interstitial prominence present. These findings consistent with congestive heart failure. Left base atelectasis and/or infiltrate present. Left pleural effusion has improved. No pneumothorax.  IMPRESSION: 1. Stable line and tube positions. 2. Findings consistent with congestive heart failure with pulmonary interstitial edema again noted. Left pleural effusion has improved. 3. Left lower lobe atelectasis and/or infiltrate.   Electronically Signed   By: Maisie Fus  Register   On: 09/23/2013 07:36   Dg Chest Port 1 View  09/22/2013   CLINICAL DATA:  Respiratory failure.  EXAM: PORTABLE CHEST - 1 VIEW  COMPARISON:  Chest radiograph September 21, 2013  FINDINGS: Endotracheal tube tip projects 4.2 cm above the carina. Left internal jugular central venous catheter with distal tip at the cavoatrial junction. Nasogastric tube past the gastroesophageal junction region code though, tip is not visualized. No pneumothorax.  Stable cardiomegaly, worsening interstitial edema, with minimal patchy airspace opacity in left lung base, unchanged. Suspected small left pleural effusion. Soft tissue planes and included osseous structures are unchanged.  IMPRESSION: No apparent change in life-support lines.  Stable cardiomegaly with slightly worsening interstitial edema. Retrocardiac airspace opacity may reflect atelectasis or confluent edema with suspected small left pleural effusion.   Electronically Signed   By: Awilda Metro   On: 09/22/2013 06:31   Dg Chest Port 1 View  09/21/2013   CLINICAL DATA:  Respiratory distress.  Intubated patient.  EXAM: PORTABLE CHEST - 1 VIEW  COMPARISON:  09/20/2013  FINDINGS: Endotracheal tube tip lies 3 cm above the carina. Nasogastric tube passes below the diaphragm well into the stomach. Left internal jugular central venous line tip  lies the caval atrial junction.  Mild hazy airspace opacity is noted in the medial right lung base. This may reflect atelectasis. Infiltrate is possible. Lungs are otherwise clear. No pleural effusion or pneumothorax  IMPRESSION: 1. Support apparatus is stable in well positioned. 2. Mild hazy opacity in the medial right lung base, likely atelectasis. This potentially could reflect a small area of infiltrate or even asymmetric edema. Lungs are otherwise clear. Findings are similar to the prior exam allowing for differences in patient positioning and technique.   Electronically Signed   By: Amie Portland M.D.   On: 09/21/2013 12:27   Dg Chest Port 1 View  09/20/2013   CLINICAL DATA:  Post IJ placement  EXAM: PORTABLE CHEST - 1 VIEW  COMPARISON:  Prior chest x-ray 09/20/2013 at 9:19 a.m.  FINDINGS: Interval placement of a left IJ approach central venous catheter. The tip of the catheter is in good position at the superior cavoatrial junction. No evidence of complicating pneumothorax. The patient remains intubated. The tip of the endotracheal tube is 4 cm above the carina. A nasogastric tube is present, the tip lies off the field of view presumably within the stomach. External defibrillator pads and metallic artifacts overlie the chest. Stable cardiomegaly, very low inspiratory volumes and mild pulmonary edema.  IMPRESSION: 1. New left IJ approach central venous catheter with the tip at the superior cavoatrial junction. No evidence of pneumothorax or other complication. 2. Otherwise, no significant interval change in the appearance of the chest.   Electronically Signed   By: Malachy Moan M.D.   On: 09/20/2013 11:48   Dg Chest Portable 1 View  09/20/2013   CLINICAL DATA:  Status post intubation.  EXAM: PORTABLE CHEST - 1 VIEW  COMPARISON:  None.  FINDINGS: 0919 hrs. Endotracheal tube tip is 3.8 cm above the base of the carina. The NG tube passes into the stomach although the distal tip position is not included  on the film. Lung volumes are low. The cardio pericardial silhouette is enlarged. Diffuse interstitial opacity is associated with vascular congestion. Telemetry leads overlie the chest. No evidence for pneumothorax or substantial pleural effusion.  IMPRESSION: Low volume film with enlargement of the cardiopericardial silhouette. Vascular congestion with interstitial opacities suggest associated edema.   Electronically Signed   By: Kennith Center M.D.   On: 09/20/2013 09:30    Medications: Scheduled Meds: . acetaminophen  500 mg Oral TID  . antiseptic oral rinse  15 mL Mouth Rinse QID  . aspirin  81 mg Oral Daily  . chlorhexidine  15 mL Mouth Rinse BID  . [START ON 10/02/2013] clopidogrel  75 mg Oral Q breakfast  . [START ON 10/06/2013] darbepoetin (ARANESP) injection - DIALYSIS  100 mcg Intravenous Q Wed-HD  . [START ON 10/04/2013] doxercalciferol  8 mcg Intravenous Q M,W,F-HD  . enoxaparin (LOVENOX) injection  30 mg Subcutaneous Q24H  . enoxaparin (LOVENOX) injection  30 mg Subcutaneous Q24H  . famotidine  20 mg Oral QHS  . hydrALAZINE  25 mg Oral Q8H  . [START ON 10/04/2013] midodrine  5 mg Oral Q M,W,F-HD  . multivitamin  1 tablet Oral QHS  . sevelamer carbonate  1,600 mg Oral TID WC      LOS: 11 days   Briana Gould M.D. [facility] 10/01/2013, 12:58 PM Pager: [phone]  If 7PM-7AM, please contact night-coverage [email or web address] Password [email or web address]

## 2013-10-01 NOTE — Progress Notes (Signed)
[name], female   DOB: 1946-07-03, 68 y.o.   MRN: [id number]  [facility] Progress Note    Subjective:   Patient was seen on dialysis and the procedure was supervised. BFR 400 Via LAVF BP is 127/56.  Patient appears to be tolerating treatment well.       Objective:   BP 138/55  Pulse 79  Temp(Src) 98.6 F (37 C) (Oral)  Resp 12  Ht 5\' 2"  (1.575 m)  Wt 80 kg (176 lb 5.9 oz)  BMI 32.25 kg/m2  SpO2 100%  Intake/Output: I/O last 3 completed shifts: In: 240 [P.O.:240] Out: -    Intake/Output this shift:    Weight change:   Physical Exam: Gen:WD WN AAF in NAD CVS:no rub Resp:cta WUJ:WJXBJY Ext:1+ pitting edema, RLE, s/p L BKA, LUE AVF +T/B  Labs: BMET  Recent Labs Lab 09/26/13 1337 09/27/13 0445 09/27/13 1500 09/28/13 0650 09/29/13 0805  NA 143 143 139 142 139  K 3.4* 3.6* 2.6* 4.7 4.9  CL 98 102 101 101 100  CO2 27 23 28 27 25   GLUCOSE 189* 117* 124* 148* 141*  BUN 21 27* 10 15 22   CREATININE 4.60* 5.61* 2.28* 3.87* 5.35*  ALBUMIN 3.3*  --   --   --  3.0*  CALCIUM 9.8 9.6 7.0* 9.6 9.8  PHOS 3.3  --   --   --  3.4   CBC  Recent Labs Lab 09/26/13 1337 09/27/13 0445 09/28/13 1405 09/29/13 0805  WBC 9.2 8.7  --  11.7*  HGB 10.4* 9.6* 9.2* 8.9*  HCT 32.1* 30.3* 28.9* 27.4*  MCV 95.8 96.8  --  97.9  PLT 213 204  --  226    @IMGRELPRIORS @ Medications:    . acetaminophen  500 mg Oral TID  . antiseptic oral rinse  15 mL Mouth Rinse QID  . aspirin  81 mg Oral Daily  . chlorhexidine  15 mL Mouth Rinse BID  . [START ON 10/02/2013] clopidogrel  75 mg Oral Q breakfast  . darbepoetin (ARANESP) injection - DIALYSIS  100 mcg Intravenous Q Wed-HD  . doxercalciferol      . doxercalciferol  8 mcg Intravenous Q M,W,F-HD  . enoxaparin (LOVENOX) injection  30 mg Subcutaneous Q24H  . famotidine  20 mg Oral QHS  . hydrALAZINE  25 mg Oral Q8H  . multivitamin  1 tablet Oral QHS  . sevelamer carbonate  1,600 mg Oral TID WC      Dialysis: MWF at [place]  4.5h (4 hr here) 79kg BFR 250 (just started buttonhole to AVF) Profile 2 F180 No heparin  Hectorol 8 Epo 20K Venofer none  Assessment/Plan:  1. 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 Cont to be major issue and alternatives have been presented but not interested in PD at this time. Unsure if this is related to cerebrovascular disease (which is diffuse and severe), severe Pulm HTN, or CAD. Keep SBP >120 with HD. 2. Severe ASCVD: see above, Dr Titus Dubin performed cerebral angio which showed severe 95%Lt ICA stenosis. Unclear if intervention is being scheduled or if CEA is warranted. Awaiting final recommendations from Dr. Titus Dubin today, although pt has flip flopped on her interest in any procedure or change in therapy. This may want another opinion from Vascular surgery. The major concern is that she has already had 2 episodes complicating HD and is not very interested in PD even though this will prevent drastic swings in BP. Will cont to follow.  1. Issues remain that  she has risk of recurrent GIB with asa and plavix as well as an increased risk for severe left hemispheric stroke if nothing is done. 2. Dr. Titus Dubinevashwar spoke with family yesterday and pt is willing to proceed with intervention.   3. Appreciate Dr. Elnoria HowardHung and his GI eval, appreciate the complexity of the case and agree her risk of an adverse event from ICA stenosis outweighs bleeding risk of duodenal and gastric ulcers while on plavix and asa (would recommend 81mg  not 325).   4. Will need to be on plavix for 5 days before procedure can be done. 3. Pulm edema: better 4. ESRD: MWF HD - on HD. Max UF 2-2.5kg to avoid large BP changes. Will use AVF and remove temp HD catheter.after HD today; Discussed again recommendation for possible switch to PD particularly if nothing can be done to improve cerebrovascular blood flow. Not using heparin because of GIB on coumadin in December, can probably resume this  week though but will stop once she starts plavix/asa 1. May need to be on HD for 4 days/week if not willing/able to do CCPD 5. Anemia / CKD: on max EPO at center, Hb 12 > 9.6 >8.9 here after some ABLA. Resumed esa w darbe 100/wk. Tsat 17%, high ferritin. No IV fe for now---had been on 20K epo as an outpt - will increase  6. MBD / CKD: adjCa 10.6, phos 3.4, Cont renvela, cont Hectorol 8 - change to 2 Ca bath for the rest of her tmt 7. HTN/volume: hydralazine resumed 25 tid, at dry weight today. Lower DW 1-2 kg as tolerated, keep SBP over 130 on HD due to #2; keep SBP>130. 8. PAD hx L BKA 9. CAD, hx diffuse "distal" disease on cath 2011 10. Dispo- appreciate Dr. Arlyss GandySchwartz's consideration and [facility].  Will cont with IHD while she remains an inpt since she is not interested in PD.  Will schedule cerebral angiogram with Dr. Titus Dubinevashwar toward end of rehab stay and start plavix 5 days prior to procedure.  [name] 10/01/2013, 9:45 AM

## 2013-10-01 NOTE — Progress Notes (Signed)
Hemodialysis- Treatment stopped with 23 minutes remaining. HR elevated to 130s sustained. Pt immediately rinsed back and given extra 250cc bolus. Dr. Arrie Aranoladonato notified. Order to give additional bolus and monitor bp. Post Bolus bp remained low at 103/56 and pt complained of nausea. Dr. Arrie Aranoladonato paged again. Order to give additional 100cc bolus. MD arrived at bedside. Order to give 5mg  midodrine po. Done. Pt HR remains elevated to low 100s-119. Dr. Sunnie Nielsenegalado at bedside when discontinuing treatment. Informed of HR/BP as well.

## 2013-10-01 NOTE — Progress Notes (Signed)
Notified by Dr. Sunnie Nielsenregalado to hold admission to inpt rehab today. [phone]

## 2013-10-01 NOTE — Progress Notes (Signed)
Patient has been accepted at CIR. Clinical Social Worker will sign off for now as social work intervention is no longer needed. Please consult us again if new need arises.    Sabino NiemannAmy Mckay Brandt, MSW, [employer]WA [phone]

## 2013-10-01 NOTE — Progress Notes (Signed)
I met with pt at bedside in hemodialysis. We discussed admission to inpt rehab today and she is in agreement. I have discussed also with Dr. Tyrell Antonio, Dr. Arty Baumgartner, and Dr. Naaman Plummer the coordination of inpt rehab stay with angiogram and possible stent with readmission to acute hospital hopefully at the end of her inpt rehab stay. Dr. Arty Baumgartner. I will arrange for today and all in agreement. I have spoken with pt's daughter, by phone. [phone]

## 2013-10-01 NOTE — Progress Notes (Signed)
PT Cancellation Note  Patient Details Name: Briana Gould MRN: [id number] DOB: 1946-01-29   Cancelled Treatment:    Reason Eval/Treat Not Completed: Patient at procedure or test/unavailable.  Pt in HD all AM.  PT to check back as time allows.    Thanks,   Rollene Rotundaebecca B. Zondra Lawlor, PT, DPT [phone]   10/01/2013, 5:34 PM

## 2013-10-01 NOTE — Progress Notes (Signed)
Pt has no complaints other than "im hungry". CBG checked 110. Report given to RN on 3000. Pt transported back to room.

## 2013-10-01 NOTE — Progress Notes (Signed)
Notified pts daughter [name] of pt transfer to [phone]. Prosthetic leg, teeth, glasses, phone, nook, shoes and knitting supplies transferred with pt. Emelda Brothershristy Frona Yost

## 2013-10-01 NOTE — Progress Notes (Signed)
Called to assist nurse tech get patient back to bed, pt was on bedside commode. Pt did not have BM. When pt stood became increasingly week. Pt wanted to cont to wash up encouraged to just rest. Dr. SwazilandJordan viewing pt chart and tele. I walked to nurses station to speak with MD and look at tele when Valarie MerinoEric Jackson NT called out for assistance, then she immediately call staff emergency. Dr. SwazilandJordan was at monitor stated pt converted to SR/Afib/SR. Pt became unresponsive and shaking per [place] NT. Pt was responsive upon arrival to room. BP 153/79 HR 62. When reviewing tele at 1515 pt had run SVT with HR 166 and at 1517 pt had 6.5 sec sinus arrest pause, pt had a few beats and had a 5.3 sec pause followed by a 4.8 sec pause. Dr. SwazilandJordan was aware. Stated if pt returned to Afib we were not going to give meds to bring HR down. Pt started to have PVC's followed by bigemmeny. Pt was tired but responsive. Called Dr. Sunnie Nielsenegalado and asked to move pt to stepdown. Continue to monitor. Emelda Brothershristy Rosemary Pentecost RN

## 2013-10-02 DIAGNOSIS — I6350 Cerebral infarction due to unspecified occlusion or stenosis of unspecified cerebral artery: Secondary | ICD-10-CM

## 2013-10-02 LAB — CBC
HEMATOCRIT: 25.9 % — AB (ref 36.0–46.0)
Hemoglobin: 8.5 g/dL — ABNORMAL LOW (ref 12.0–15.0)
MCH: 30.9 pg (ref 26.0–34.0)
MCHC: 32.8 g/dL (ref 30.0–36.0)
MCV: 94.2 fL (ref 78.0–100.0)
Platelets: 222 10*3/uL (ref 150–400)
RBC: 2.75 MIL/uL — ABNORMAL LOW (ref 3.87–5.11)
RDW: 18.2 % — ABNORMAL HIGH (ref 11.5–15.5)
WBC: 12.4 10*3/uL — ABNORMAL HIGH (ref 4.0–10.5)

## 2013-10-02 LAB — GLUCOSE, CAPILLARY
Glucose-Capillary: 110 mg/dL — ABNORMAL HIGH (ref 70–99)
Glucose-Capillary: 139 mg/dL — ABNORMAL HIGH (ref 70–99)
Glucose-Capillary: 197 mg/dL — ABNORMAL HIGH (ref 70–99)
Glucose-Capillary: 181 mg/dL — ABNORMAL HIGH (ref 70–99)
Glucose-Capillary: 152 mg/dL — ABNORMAL HIGH (ref 70–99)

## 2013-10-02 LAB — HEMOGLOBIN A1C
Hgb A1c MFr Bld: 5.9 % — ABNORMAL HIGH (ref <5.7)
Mean Plasma Glucose: 123 mg/dL — ABNORMAL HIGH (ref <117)

## 2013-10-02 LAB — BASIC METABOLIC PANEL
BUN: 19 mg/dL (ref 6–23)
CO2: 27 meq/L (ref 19–32)
Calcium: 10.1 mg/dL (ref 8.4–10.5)
Chloride: 97 mEq/L (ref 96–112)
Creatinine, Ser: 4.45 mg/dL — ABNORMAL HIGH (ref 0.50–1.10)
GFR calc Af Amer: 11 mL/min — ABNORMAL LOW (ref >90)
GFR, EST NON AFRICAN AMERICAN: 9 mL/min — AB (ref >90)
GLUCOSE: 195 mg/dL — AB (ref 70–99)
Potassium: 4.5 mEq/L (ref 3.7–5.3)
Sodium: 141 mEq/L (ref 137–147)

## 2013-10-02 MED ORDER — INSULIN ASPART 100 UNIT/ML ~~LOC~~ SOLN
0.0000 [IU] | Freq: Three times a day (TID) | SUBCUTANEOUS | Status: DC
  Administered 2013-10-02 – 2013-10-03 (×2): 2 [IU] via SUBCUTANEOUS
  Administered 2013-10-03 (×2): 1 [IU] via SUBCUTANEOUS
  Administered 2013-10-04: 3 [IU] via SUBCUTANEOUS
  Administered 2013-10-05 (×2): 2 [IU] via SUBCUTANEOUS
  Administered 2013-10-05: 1 [IU] via SUBCUTANEOUS
  Administered 2013-10-06: 2 [IU] via SUBCUTANEOUS
  Administered 2013-10-07: 3 [IU] via SUBCUTANEOUS
  Administered 2013-10-07: 1 [IU] via SUBCUTANEOUS
  Administered 2013-10-08 (×3): 2 [IU] via SUBCUTANEOUS
  Administered 2013-10-09: 1 [IU] via SUBCUTANEOUS
  Administered 2013-10-09 – 2013-10-10 (×2): 2 [IU] via SUBCUTANEOUS
  Administered 2013-10-10 – 2013-10-12 (×2): 1 [IU] via SUBCUTANEOUS
  Administered 2013-10-12: 2 [IU] via SUBCUTANEOUS
  Administered 2013-10-12 – 2013-10-14 (×3): 1 [IU] via SUBCUTANEOUS
  Administered 2013-10-14: 3 [IU] via SUBCUTANEOUS
  Administered 2013-10-14: 1 [IU] via SUBCUTANEOUS

## 2013-10-02 MED ORDER — ALTEPLASE 2 MG IJ SOLR
2.0000 mg | Freq: Once | INTRAMUSCULAR | Status: DC
  Filled 2013-10-02: qty 2

## 2013-10-02 MED ORDER — HEPARIN SODIUM (PORCINE) 5000 UNIT/ML IJ SOLN
5000.0000 [IU] | Freq: Three times a day (TID) | INTRAMUSCULAR | Status: DC
  Administered 2013-10-02 – 2013-10-05 (×9): 5000 [IU] via SUBCUTANEOUS
  Filled 2013-10-02 (×12): qty 1

## 2013-10-02 NOTE — Progress Notes (Signed)
Subjective: Patient denies dizzines  No SOB   Objective: Filed Vitals:   10/02/13 0000 10/02/13 0344 10/02/13 0400 10/02/13 0500  BP: 125/44  126/53   Pulse: 73  71   Temp:  99.1 F (37.3 C)    TempSrc:  Oral    Resp: 22  15   Height:      Weight:    172 lb 13.5 oz (78.4 kg)  SpO2: 100%  100%    Weight change:   Intake/Output Summary (Last 24 hours) at 10/02/13 0739 Last data filed at 10/01/13 1150  Gross per 24 hour  Intake      0 ml  Output   1383 ml  Net  -1383 ml    General: Alert, awake, oriented x3, in no acute distress Neck:  JVP is difficult to assess Heart: Regular rate and rhythm, without murmurs, rubs, gallops.  Lungs: Clear to auscultation.  No rales or wheezes. Exemities:  1-2+ RLE  S/p L BKA  Neuro: Grossly intact, nonfocal.  Tele:  SR 60s to 70s   Lab Results: Results for orders placed during the hospital encounter of 09/20/13 (from the past 24 hour(s))  GLUCOSE, CAPILLARY     Status: Abnormal   Collection Time    10/01/13 12:12 PM      Result Value Range   Glucose-Capillary 110 (*) 70 - 99 mg/dL  GLUCOSE, CAPILLARY     Status: Abnormal   Collection Time    10/01/13  1:10 PM      Result Value Range   Glucose-Capillary 124 (*) 70 - 99 mg/dL   Comment 1 Documented in Chart     Comment 2 Notify RN    GLUCOSE, CAPILLARY     Status: Abnormal   Collection Time    10/01/13  5:08 PM      Result Value Range   Glucose-Capillary 157 (*) 70 - 99 mg/dL   Comment 1 Documented in Chart     Comment 2 Notify RN    GLUCOSE, CAPILLARY     Status: Abnormal   Collection Time    10/01/13  9:29 PM      Result Value Range   Glucose-Capillary 125 (*) 70 - 99 mg/dL    Studies/Results: @RISRSLT24 @  Medications: Reviewed   @PROBHOSP @  1.  S/p PEA  Etiology still not clear.  Follow for now  Will need to review past records.    2.  Bradycardia  No spells of bradycardia.  With vascular issues would not be a good candidate for pacer.  Reasonable to pursue  stenting   3.  Afib  Episode in dialysis yesterday.  i would follow  On ASA and plavix for now.  Will need to evaluate  4.  ESRD  Dialysis MWF  5.  CAD  Severe distal dz at cath in 2011.  Not amenable to PCI/CABG  6.  PAD  7  CV disease  Plan for carotid stenting next wk.      LOS: 12 days   Dietrich Patesaula Ross 10/02/2013, 7:39 AM

## 2013-10-02 NOTE — Progress Notes (Signed)
[name], female   DOB: [birth year], 68 y.o.   MRN: [id number]  [facility] Progress Note    Subjective:   Events of last 12 hours noted.  "I'm starting to get depressed about everything that is wrong with me, how did this happen?"   Objective:   BP 132/60  Pulse 76  Temp(Src) 99.1 F (37.3 C) (Oral)  Resp 12  Ht 5\' 6"  (1.676 m)  Wt 78.4 kg (172 lb 13.5 oz)  BMI 27.91 kg/m2  SpO2 100%  Intake/Output: I/O last 3 completed shifts: In: -  Out: 1383 [Other:1383]   Intake/Output this shift:    Weight change:   Physical Exam: Gen:WD elderly AAF in NAD CVS:no rub,  Resp:cta NWG:NFAOZH Ext:s/p LBKA, right lower ext with 1+ edema, LUE AVF +T/B  Labs: BMET  Recent Labs Lab 09/26/13 1337 09/27/13 0445 09/27/13 1500 09/28/13 0650 09/29/13 0805  NA 143 143 139 142 139  K 3.4* 3.6* 2.6* 4.7 4.9  CL 98 102 101 101 100  CO2 27 23 28 27 25   GLUCOSE 189* 117* 124* 148* 141*  BUN 21 27* 10 15 22   CREATININE 4.60* 5.61* 2.28* 3.87* 5.35*  ALBUMIN 3.3*  --   --   --  3.0*  CALCIUM 9.8 9.6 7.0* 9.6 9.8  PHOS 3.3  --   --   --  3.4   CBC  Recent Labs Lab 09/26/13 1337 09/27/13 0445 09/28/13 1405 09/29/13 0805  WBC 9.2 8.7  --  11.7*  HGB 10.4* 9.6* 9.2* 8.9*  HCT 32.1* 30.3* 28.9* 27.4*  MCV 95.8 96.8  --  97.9  PLT 213 204  --  226    @IMGRELPRIORS @ Medications:    . acetaminophen  500 mg Oral TID  . aspirin  81 mg Oral Daily  . clopidogrel  75 mg Oral Q breakfast  . [START ON 10/06/2013] darbepoetin (ARANESP) injection - DIALYSIS  100 mcg Intravenous Q Wed-HD  . [START ON 10/04/2013] doxercalciferol  8 mcg Intravenous Q M,W,F-HD  . famotidine  20 mg Oral QHS  . [START ON 10/04/2013] midodrine  5 mg Oral Q M,W,F-HD  . multivitamin  1 tablet Oral QHS  . sevelamer carbonate  1,600 mg Oral TID WC     Dialysis: MWF at   4.5h (4 hr here) 79kg BFR 250 (just started buttonhole to AVF) Profile 2 F180 No heparin  Hectorol 8  Epo 20K Venofer none  Assessment/Plan:  1. 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 Cont to be major issue and alternatives have been presented but not interested in PD at this time. Unsure if this is related to cerebrovascular disease (which is diffuse and severe), severe Pulm HTN, or CAD/arrythmias. Keep SBP >120 with HD. 1. Pt now s/p AFib with RVR and hypotension, followed by 6.8sec pause, not a great candidate for meds due to ESRD, hypotension, severe cerebrovascular disease (not a good pacemaker candidate either given RIJ occlusion and LUE AVF).   2. Appreciate Cardiology's evaluation/assistance and may need EP consult. 2. Severe ASCVD: see above, Dr Titus Dubin performed cerebral angio which showed severe 95%Lt ICA stenosis. Unclear if intervention is being scheduled or if CEA is warranted. Awaiting final recommendations from Dr. Titus Dubin today, although pt has flip flopped on her interest in any procedure or change in therapy. This may want another opinion from Vascular surgery. The major concern is that she has already had 2 episodes complicating HD and is not very interested in PD even though this  will prevent drastic swings in BP. Will cont to follow.  1. Issues remain that she has risk of recurrent GIB with asa and plavix as well as an increased risk for severe left hemispheric stroke if nothing is done. 2. Dr. Titus Dubinevashwar spoke with family yesterday and pt is willing to proceed with intervention.  3. Appreciate Dr. Elnoria HowardHung and his GI eval, appreciate the complexity of the case and agree her risk of an adverse event from ICA stenosis outweighs bleeding risk of duodenal and gastric ulcers while on plavix and asa (would recommend 81mg  not 325).  4. Will need to be on plavix for 5 days before procedure can be done. 3. Pulm edema: better 4. ESRD: MWF HD - on HD. Max UF 2-2.5kg to avoid large BP changes. Will use AVF and remove temp HD catheter.after HD today; Discussed again recommendation for  possible switch to PD particularly if nothing can be done to improve cerebrovascular blood flow. Not using heparin because of GIB on coumadin in December, can probably resume this week though but will stop once she starts plavix/asa  1. May need to be on HD for 4 days/week if not willing/able to do CCPD 5. Anemia / CKD: on max EPO at center, Hb 12 > 9.6 >8.9 here after some ABLA. Resumed esa w darbe 100/wk. Tsat 17%, high ferritin. No IV fe for now---had been on 20K epo as an outpt - will increase  6. MBD / CKD: adjCa 10.6, phos 3.4, Cont renvela, cont Hectorol 8 - change to 2 Ca bath for the rest of her tmt 7. HTN/volume: hydralazine resumed 25 tid, at dry weight today. Lower DW 1-2 kg as tolerated, keep SBP over 130 on HD due to #2; keep SBP>130. 8. PAD hx L BKA 9. CAD, hx diffuse "distal" disease on cath 2011 10. Dispo- appreciate Dr. Arlyss GandySchwartz's consideration but transfer to CIR temporarily on hold due to Afib/hypotension and bradycardia/pause. Will cont with IHD while she remains an inpt since she is not interested in PD. Cerebral angiogram with Dr. Titus Dubinevashwar after 5 days of plavix prior to procedure.  11.   Briana Gould A 10/02/2013, 9:34 AM

## 2013-10-02 NOTE — Progress Notes (Signed)
[name]  female  ZOX:[id number]    DOB: August 12, 1946    DOA: 09/20/2013  PCP: [name], MD  Assessment/Plan: Principal Problem:   Cardiac arrest   ESRD (end stage renal disease)   Acute respiratory failure with hypoxia   Hypertension   Encephalopathy acute   H/O: GI bleed  Brief narrative:  68 y/o female with PMH of DM type 2, CAD, severe cerebrovascular disease, right IJ thrombus PAD with b/l BKA, severe carotid disease, Pulm HTN, ESRD on HD and cardiac arrest while on HD who was discharged on 12/21 after being treated at [facility] for an upper GI bleed in setting of Coumadin use (IJ thrombus). Readmitted for PEA arrest while on dialysis.    Assessment/Plan:  -A fib: RVR; Patient Blood pressure decrease during dialysis 1-23. HR increased. She received IV bolus. Cardiology consulted. She was on coumadin at some point but this was stop due to bleed. Difficult situation with anticoagulation. Patient converted spontaneously. She had a pause of 6 sec. She didn't received any medications. Cardiology managing.   -Cardiac arrest (PEA)  - multiple times - etiology not fully determined,  question carotid stenosis, severe pulm HTN or severe CAD may be contributing when she is becoming hypotensivie during dialysis sessions.  -Chest pain: resolved.  -Musculoskeletal likely due to CPR with chest wall tenderness improving:  - Placed her on tramadol as needed and Tylenol 500mg  TID.   -ESRD (end stage renal disease)  - per nephrology.   -Acute respiratory failure with hypoxia/ Pulmonary edema/ diastolic CHF  - s/p arrest  - nephrology team to manage fluid balance   Carotid artery stenosis - 1-39% stenosis involving the left internal carotid artery. - Findings consistent with 60-79% right ICA stenosis  - interventional radiology has been consulted, cerebral angiogram done today by Dr. Corliss Skains.  Findings. Severe stenosis of LT ICA pet cavernous junction of 95% , 60 % stenosis  of RT ICA prox.. 50 % steosis of LT ICA petrous seg. Occluded RT VBJ -Plan for intracranial angiogram and or possible stent next week. Dr Roney Jaffe agree with plavix and aspirin prior to procedure. Monitor CBC.  Patient needs to be on plavix and aspirin for procedure for at least 5 days.  -Day 1 plavix, aspirin.    Severe Pulmonary HTN with moderate TR, CAD  - cath in 2011 revealed diffuse distal disease   Right IJ thrombus  -found on a previous admission- not a candidate for anticoagulation due to PUD with bleed in 12/14 in setting of Coumadin   PUD - cont Pepcid   Hypertension  - Hydralazine  PRN. Careful with Hypotension.   Encephalopathy acute - likely due to anoxia secondary to cardiac arrest- may have new cognitive baseline-  - Levaquin started on 1/16- continue for 5 days . Finished course.  -Resolved.   Severe PAD with left BKA  - family brought in prosthesis in to allow pt to ambulate   DVT Prophylaxis: SCD, patient on plavix, aspirin. High risk for bleeding.   Code Status: Full code  Family Communication: Care discussed with patient.   Disposition: transfer to telemetry per cardio.   SIGNIFICANT EVENTS / STUDIES:  1/12 cardiac arrest/ PEA at HD. 20 minutes estimated to ROSC  1/12 Echocardiogram: LVEF 50-55%. No RWMAs. Moderate LVH. Probably mild AS. Mild MR. RVSP est 69 mmHg  1/12 CT head: Age uncertain small infarct mid pons. Small infarct in this area may be recent.  1/13 Cognition much improved. Failed  SBT  1/15 Extubated successfully  1/20: Cerebral angiogram done   Subjective: She is feeling better. Denies chest pain, dyspnea, lightheaded.  She feels trap, she can not do anything. She was crying. She wonders how she got to this position.   Objective: Weight change:   Intake/Output Summary (Last 24 hours) at 10/02/13 0731 Last data filed at 10/01/13 1150  Gross per 24 hour  Intake      0 ml  Output   1383 ml  Net  -1383 ml   Blood pressure  126/53, pulse 71, temperature 99.1 F (37.3 C), temperature source Oral, resp. rate 15, height 5\' 6"  (1.676 m), weight 78.4 kg (172 lb 13.5 oz), SpO2 100.00%.  Physical Exam: General: Alert and awake, oriented CVS: S1-S2 clear Chest: CTAB Abdomen: soft nontender, nondistended, normal bowel sounds  Extremities: no cyanosis, clubbing left BKA,   Lab Results: Basic Metabolic Panel:  Recent Labs Lab 09/28/13 0650 09/29/13 0805  NA 142 139  K 4.7 4.9  CL 101 100  CO2 27 25  GLUCOSE 148* 141*  BUN 15 22  CREATININE 3.87* 5.35*  CALCIUM 9.6 9.8  PHOS  --  3.4   Liver Function Tests:  Recent Labs Lab 09/26/13 1337 09/29/13 0805  ALBUMIN 3.3* 3.0*   No results found for this basename: LIPASE, AMYLASE,  in the last 168 hours No results found for this basename: AMMONIA,  in the last 168 hours CBC:  Recent Labs Lab 09/27/13 0445 09/28/13 1405 09/29/13 0805  WBC 8.7  --  11.7*  HGB 9.6* 9.2* 8.9*  HCT 30.3* 28.9* 27.4*  MCV 96.8  --  97.9  PLT 204  --  226   Cardiac Enzymes: No results found for this basename: CKTOTAL, CKMB, CKMBINDEX, TROPONINI,  in the last 168 hours BNP: No components found with this basename: POCBNP,  CBG:  Recent Labs Lab 09/30/13 2122 10/01/13 1212 10/01/13 1310 10/01/13 1708 10/01/13 2129  GLUCAP 215* 110* 124* 157* 125*     Micro Results: No results found for this or any previous visit (from the past 240 hour(s)).  Studies/Results: Ct Head Wo Contrast  09/20/2013   CLINICAL DATA:  Altered mental status ; status post cardiopulmonary resuscitation  EXAM: CT HEAD WITHOUT CONTRAST  TECHNIQUE: Contiguous axial images were obtained from the base of the skull through the vertex without intravenous contrast.  COMPARISON:  None.  FINDINGS: There is age related volume loss. There is no demonstrable mass, hemorrhage, extra-axial fluid collection, or midline shift. There is mild small vessel disease in the centra semiovale bilaterally. There is  decreased attenuation in the midline of the upper to mid pons which may represent a small recent infarct in this area. No other acute appearing infarct is appreciable.  Bony calvarium appears intact.  The mastoid air cells are clear.  IMPRESSION: Age uncertain small infarct mid pons. Small infarct in this area may be recent.  There is underlying age related volume loss with mild periventricular small vessel disease. There is no intracranial mass or hemorrhage. No gray-white compartment edema is appreciable on this study.   Electronically Signed   By: Bretta BangWilliam  Woodruff M.D.   On: 09/20/2013 12:54   Dg Chest Port 1 View  09/25/2013   CLINICAL DATA:  Edema after dialysis.  EXAM: PORTABLE CHEST - 1 VIEW  COMPARISON:  One-view chest 09/24/2013.  FINDINGS: Cardiac enlargement is stable. A left IJ line is stable in position. Interstitial edema is improved. No focal airspace consolidation is evident.  IMPRESSION: 1. Improved interstitial edema. 2. Stable cardiomegaly.   Electronically Signed   By: Gennette Pac M.D.   On: 09/25/2013 21:55   Dg Chest Port 1 View  09/24/2013   CLINICAL DATA:  Respiratory failure  EXAM: PORTABLE CHEST - 1 VIEW  COMPARISON:  09/23/2013  FINDINGS: Interval tracheal and esophageal extubation. Left IJ catheter in stable position, tip at the superior cavoatrial junction.  Unchanged cardiomegaly and pulmonary edema. These changes could obscure superimposed infection. No evidence of increased pleural fluid. No pneumothorax.  IMPRESSION: 1. Stable lung volumes after tracheal extubation. 2. Unchanged pulmonary edema, which could obscure superimposed consolidation.   Electronically Signed   By: Tiburcio Pea M.D.   On: 09/24/2013 06:23   Dg Chest Port 1 View  09/23/2013   CLINICAL DATA:  Respiratory failure.  EXAM: PORTABLE CHEST - 1 VIEW  COMPARISON:  09/22/2013.  FINDINGS: Endotracheal tube, left IJ line, NG tube in stable position. Persistent cardiomegaly and pulmonary venous congestion  present. Interstitial prominence present. These findings consistent with congestive heart failure. Left base atelectasis and/or infiltrate present. Left pleural effusion has improved. No pneumothorax.  IMPRESSION: 1. Stable line and tube positions. 2. Findings consistent with congestive heart failure with pulmonary interstitial edema again noted. Left pleural effusion has improved. 3. Left lower lobe atelectasis and/or infiltrate.   Electronically Signed   By: Maisie Fus  Register   On: 09/23/2013 07:36   Dg Chest Port 1 View  09/22/2013   CLINICAL DATA:  Respiratory failure.  EXAM: PORTABLE CHEST - 1 VIEW  COMPARISON:  Chest radiograph September 21, 2013  FINDINGS: Endotracheal tube tip projects 4.2 cm above the carina. Left internal jugular central venous catheter with distal tip at the cavoatrial junction. Nasogastric tube past the gastroesophageal junction region code though, tip is not visualized. No pneumothorax.  Stable cardiomegaly, worsening interstitial edema, with minimal patchy airspace opacity in left lung base, unchanged. Suspected small left pleural effusion. Soft tissue planes and included osseous structures are unchanged.  IMPRESSION: No apparent change in life-support lines.  Stable cardiomegaly with slightly worsening interstitial edema. Retrocardiac airspace opacity may reflect atelectasis or confluent edema with suspected small left pleural effusion.   Electronically Signed   By: Awilda Metro   On: 09/22/2013 06:31   Dg Chest Port 1 View  09/21/2013   CLINICAL DATA:  Respiratory distress.  Intubated patient.  EXAM: PORTABLE CHEST - 1 VIEW  COMPARISON:  09/20/2013  FINDINGS: Endotracheal tube tip lies 3 cm above the carina. Nasogastric tube passes below the diaphragm well into the stomach. Left internal jugular central venous line tip lies the caval atrial junction.  Mild hazy airspace opacity is noted in the medial right lung base. This may reflect atelectasis. Infiltrate is possible. Lungs  are otherwise clear. No pleural effusion or pneumothorax  IMPRESSION: 1. Support apparatus is stable in well positioned. 2. Mild hazy opacity in the medial right lung base, likely atelectasis. This potentially could reflect a small area of infiltrate or even asymmetric edema. Lungs are otherwise clear. Findings are similar to the prior exam allowing for differences in patient positioning and technique.   Electronically Signed   By: Amie Portland M.D.   On: 09/21/2013 12:27   Dg Chest Port 1 View  09/20/2013   CLINICAL DATA:  Post IJ placement  EXAM: PORTABLE CHEST - 1 VIEW  COMPARISON:  Prior chest x-ray 09/20/2013 at 9:19 a.m.  FINDINGS: Interval placement of a left IJ approach central venous catheter. The tip of  the catheter is in good position at the superior cavoatrial junction. No evidence of complicating pneumothorax. The patient remains intubated. The tip of the endotracheal tube is 4 cm above the carina. A nasogastric tube is present, the tip lies off the field of view presumably within the stomach. External defibrillator pads and metallic artifacts overlie the chest. Stable cardiomegaly, very low inspiratory volumes and mild pulmonary edema.  IMPRESSION: 1. New left IJ approach central venous catheter with the tip at the superior cavoatrial junction. No evidence of pneumothorax or other complication. 2. Otherwise, no significant interval change in the appearance of the chest.   Electronically Signed   By: Malachy Moan M.D.   On: 09/20/2013 11:48   Dg Chest Portable 1 View  09/20/2013   CLINICAL DATA:  Status post intubation.  EXAM: PORTABLE CHEST - 1 VIEW  COMPARISON:  None.  FINDINGS: 0919 hrs. Endotracheal tube tip is 3.8 cm above the base of the carina. The NG tube passes into the stomach although the distal tip position is not included on the film. Lung volumes are low. The cardio pericardial silhouette is enlarged. Diffuse interstitial opacity is associated with vascular congestion.  Telemetry leads overlie the chest. No evidence for pneumothorax or substantial pleural effusion.  IMPRESSION: Low volume film with enlargement of the cardiopericardial silhouette. Vascular congestion with interstitial opacities suggest associated edema.   Electronically Signed   By: Kennith Center M.D.   On: 09/20/2013 09:30    Medications: Scheduled Meds: . acetaminophen  500 mg Oral TID  . aspirin  81 mg Oral Daily  . clopidogrel  75 mg Oral Q breakfast  . [START ON 10/06/2013] darbepoetin (ARANESP) injection - DIALYSIS  100 mcg Intravenous Q Wed-HD  . [START ON 10/04/2013] doxercalciferol  8 mcg Intravenous Q M,W,F-HD  . famotidine  20 mg Oral QHS  . [START ON 10/04/2013] midodrine  5 mg Oral Q M,W,F-HD  . multivitamin  1 tablet Oral QHS  . sevelamer carbonate  1,600 mg Oral TID WC      LOS: 12 days   Saydee Zolman M.D. [facility] 10/02/2013, 7:31 AM Pager: [phone]  If 7PM-7AM, please contact night-coverage [email or web address] Password [email or web address]

## 2013-10-02 NOTE — Progress Notes (Signed)
Order for speech evaluation received for CIR.  If MD indicates needs for speech/swallow in acute given dysphagia during this acute stay, please order.   Briana Burnetamara Kaveri Perras, [name]

## 2013-10-03 LAB — GLUCOSE, CAPILLARY
GLUCOSE-CAPILLARY: 184 mg/dL — AB (ref 70–99)
GLUCOSE-CAPILLARY: 138 mg/dL — AB (ref 70–99)
GLUCOSE-CAPILLARY: 143 mg/dL — AB (ref 70–99)
Glucose-Capillary: 133 mg/dL — ABNORMAL HIGH (ref 70–99)

## 2013-10-03 LAB — CBC
HCT: 25.8 % — ABNORMAL LOW (ref 36.0–46.0)
HEMOGLOBIN: 8.3 g/dL — AB (ref 12.0–15.0)
MCH: 31 pg (ref 26.0–34.0)
MCHC: 32.2 g/dL (ref 30.0–36.0)
MCV: 96.3 fL (ref 78.0–100.0)
Platelets: 238 10*3/uL (ref 150–400)
RBC: 2.68 MIL/uL — ABNORMAL LOW (ref 3.87–5.11)
RDW: 18.4 % — AB (ref 11.5–15.5)
WBC: 12 10*3/uL — AB (ref 4.0–10.5)

## 2013-10-03 NOTE — Progress Notes (Signed)
Patient ID: Stepheni Schorsch, female   DOB: 1946/07/04, 68 y.o.   MRN: [id number]  [facility] Progress Note    Subjective:   Feels good   Objective:   BP 148/58  Pulse 92  Temp(Src) 98.4 F (36.9 C) (Oral)  Resp 22  Ht 5\' 6"  (1.676 m)  Wt 82.1 kg (181 lb)  BMI 29.23 kg/m2  SpO2 99%  Intake/Output: I/O last 3 completed shifts: In: 1460 [P.O.:1040; I.V.:420] Out: -    Intake/Output this shift:    Weight change: 2.1 kg (4 lb 10.1 oz)  Physical Exam: Gen:wd wn aaf in nad CVS:irr, no rub Resp:cta ZHY:QMVHQIAbd:benign Ext:LUE AVF +T/B, 1+ edema RLE, s/p left BKA  Labs: BMET  Recent Labs Lab 09/26/13 1337 09/27/13 0445 09/27/13 1500 09/28/13 0650 09/29/13 0805 10/02/13 0730  NA 143 143 139 142 139 141  K 3.4* 3.6* 2.6* 4.7 4.9 4.5  CL 98 102 101 101 100 97  CO2 27 23 28 27 25 27   GLUCOSE 189* 117* 124* 148* 141* 195*  BUN 21 27* 10 15 22 19   CREATININE 4.60* 5.61* 2.28* 3.87* 5.35* 4.45*  ALBUMIN 3.3*  --   --   --  3.0*  --   CALCIUM 9.8 9.6 7.0* 9.6 9.8 10.1  PHOS 3.3  --   --   --  3.4  --    CBC  Recent Labs Lab 09/26/13 1337 09/27/13 0445 09/28/13 1405 09/29/13 0805 10/02/13 0730  WBC 9.2 8.7  --  11.7* 12.4*  HGB 10.4* 9.6* 9.2* 8.9* 8.5*  HCT 32.1* 30.3* 28.9* 27.4* 25.9*  MCV 95.8 96.8  --  97.9 94.2  PLT 213 204  --  226 222    @IMGRELPRIORS @ Medications:    . acetaminophen  500 mg Oral TID  . alteplase  2 mg Intracatheter Once  . aspirin  81 mg Oral Daily  . clopidogrel  75 mg Oral Q breakfast  . [START ON 10/06/2013] darbepoetin (ARANESP) injection - DIALYSIS  100 mcg Intravenous Q Wed-HD  . [START ON 10/04/2013] doxercalciferol  8 mcg Intravenous Q M,W,F-HD  . famotidine  20 mg Oral QHS  . heparin subcutaneous  5,000 Units Subcutaneous Q8H  . insulin aspart  0-9 Units Subcutaneous TID WC  . [START ON 10/04/2013] midodrine  5 mg Oral Q M,W,F-HD  . multivitamin  1 tablet Oral QHS  . sevelamer carbonate  1,600 mg Oral TID WC      Dialysis: MWF at [facility]  4.5h (4 hr here) 79kg BFR 250 (just started buttonhole to AVF) Profile 2 F180 No heparin  Hectorol 8 Epo 20K Venofer none  Assessment/Plan:  1. 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 Cont to be major issue and alternatives have been presented but not interested in PD at this time. Unsure if this is related to cerebrovascular disease (which is diffuse and severe), severe Pulm HTN, or CAD/arrythmias (had A fib with 6.8 second pause on 10/01/13). Keep SBP >130 with HD.  1. Pt now s/p AFib with RVR and hypotension, followed by 6.8sec pause, not a great candidate for meds due to ESRD, hypotension, severe cerebrovascular disease (not a good pacemaker candidate either given RIJ occlusion and LUE AVF).  2. Appreciate Cardiology's evaluation/assistance and may need EP consult. 2. Severe ASCVD: see above, Dr Titus Dubinevashwar performed cerebral angio which showed severe 95%Lt ICA stenosis. Unclear if intervention is being scheduled or if CEA is warranted. Awaiting final recommendations from Dr. Titus Dubinevashwar today, although pt has flip flopped on  her interest in any procedure or change in therapy. This may want another opinion from Vascular surgery. The major concern is that she has already had 2 episodes complicating HD and is not very interested in PD even though this will prevent drastic swings in BP. Will cont to follow.  1. Issues remain that she has risk of recurrent GIB with asa and plavix as well as an increased risk for severe left hemispheric stroke if nothing is done. 2. Dr. Titus Dubin spoke with family yesterday and pt is willing to proceed with intervention.  3. Appreciate Dr. Elnoria Howard and his GI eval, appreciate the complexity of the case and agree her risk of an adverse event from ICA stenosis outweighs bleeding risk of duodenal and gastric ulcers while on plavix and asa (would recommend 81mg  not 325).  4. Will need to be on plavix for 5 days before procedure can be  done. 3. Pulm edema: better 4. ESRD: MWF HD - on HD. Max UF 2-2.5kg to avoid large BP changes. Will use AVF and remove temp HD catheter.after HD today; Discussed again recommendation for possible switch to PD particularly if nothing can be done to improve cerebrovascular blood flow. Not using heparin because of GIB on coumadin in December, can probably resume this week though but will stop once she starts plavix/asa  1. May need to be on HD for 4 days/week if not willing/able to do CCPD 5. Anemia / CKD: on max EPO at center, Hb 12 > 9.6 >8.9 here after some ABLA. Resumed esa w darbe 100/wk. Tsat 17%, high ferritin. No IV fe for now---had been on 20K epo as an outpt - will increase  6. MBD / CKD: adjCa 10.6, phos 3.4, Cont renvela, cont Hectorol 8 - change to 2 Ca bath for the rest of her tmt 7. HTN/volume: hydralazine resumed 25 tid, at dry weight today. Lower DW 1-2 kg as tolerated, keep SBP over 130 on HD due to #2; keep SBP>130. 8. PAD hx L BKA 9. CAD, hx diffuse "distal" disease on cath 2011 10. Dispo- appreciate Dr. Arlyss Gandy consideration but transfer to CIR temporarily on hold due to Afib/hypotension and bradycardia/pause. Will cont with IHD while she remains an inpt since she is not interested in PD. Cerebral angiogram with Dr. Titus Dubin after 5 days of plavix prior to procedure.  11.   Lilyana Lippman A 10/03/2013, 10:09 AM

## 2013-10-03 NOTE — Progress Notes (Signed)
Subjective: Patient denies dizzines  No SOB or CP, she is in good spirits and has no complains.   Meds:   . acetaminophen  500 mg Oral TID  . alteplase  2 mg Intracatheter Once  . aspirin  81 mg Oral Daily  . clopidogrel  75 mg Oral Q breakfast  . [START ON 10/06/2013] darbepoetin (ARANESP) injection - DIALYSIS  100 mcg Intravenous Q Wed-HD  . [START ON 10/04/2013] doxercalciferol  8 mcg Intravenous Q M,W,F-HD  . famotidine  20 mg Oral QHS  . heparin subcutaneous  5,000 Units Subcutaneous Q8H  . insulin aspart  0-9 Units Subcutaneous TID WC  . [START ON 10/04/2013] midodrine  5 mg Oral Q M,W,F-HD  . multivitamin  1 tablet Oral QHS  . sevelamer carbonate  1,600 mg Oral TID WC    Objective: Filed Vitals:   10/03/13 0415 10/03/13 0420 10/03/13 0825 10/03/13 1159  BP: 125/54  148/58 142/52  Pulse:   92 76  Temp:  98.4 F (36.9 C) 98.4 F (36.9 C) 98.2 F (36.8 C)  TempSrc:  Oral Oral Oral  Resp: 16  22 20   Height:      Weight:  181 lb (82.1 kg)    SpO2:   99% 100%   Weight change: 4 lb 10.1 oz (2.1 kg)  Intake/Output Summary (Last 24 hours) at 10/03/13 1300 Last data filed at 10/03/13 1100  Gross per 24 hour  Intake   1250 ml  Output      0 ml  Net   1250 ml    General: Alert, awake, oriented x3, in no acute distress Neck:  JVP is difficult to assess Heart: Regular rate and rhythm, without murmurs, rubs, gallops.  Lungs: Clear to auscultation.  No rales or wheezes. Exemities:  1-2+ RLE  S/p L BKA  Neuro: Grossly intact, nonfocal.  Tele:  SR 60s to 70s   Lab Results: Results for orders placed during the hospital encounter of 09/20/13 (from the past 24 hour(s))  GLUCOSE, CAPILLARY     Status: Abnormal   Collection Time    10/02/13  5:04 PM      Result Value Range   Glucose-Capillary 181 (*) 70 - 99 mg/dL  GLUCOSE, CAPILLARY     Status: Abnormal   Collection Time    10/02/13  9:20 PM      Result Value Range   Glucose-Capillary 152 (*) 70 - 99 mg/dL  GLUCOSE,  CAPILLARY     Status: Abnormal   Collection Time    10/03/13  8:28 AM      Result Value Range   Glucose-Capillary 133 (*) 70 - 99 mg/dL  GLUCOSE, CAPILLARY     Status: Abnormal   Collection Time    10/03/13 12:05 PM      Result Value Range   Glucose-Capillary 184 (*) 70 - 99 mg/dL     Assessment and Plan:   1.  S/p PEA  Etiology still not clear.  Follow for now  Will need to review past records.    2.  Bradycardia  No spells of bradycardia.  With vascular issues would not be a good candidate for pacer.  Reasonable to pursue stenting   3.  Afib  Episode in dialysis yesterday.  i would follow  On ASA and plavix for now.  Will need to evaluate  4.  ESRD  Dialysis MWF  5.  CAD  Severe distal dz at cath in 2011.  Not amenable to PCI/CABG  6.  PAD  7  CV disease  Plan for carotid stenting next wk.      LOS: 13 days   Tobias AlexanderELSON, Neshawn Aird, H 10/03/2013, 1:00 PM

## 2013-10-03 NOTE — Progress Notes (Signed)
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 Readmitted for PEA arrest while on dialysis.    Assessment/Plan:  -A fib: RVR with subsequent cardiac pause 6.8 sec. ; Patient Blood pressure decrease during dialysis 1-23. HR increased. She received IV bolus. Cardiology consulted. She was on coumadin at some point but this was stop due to bleed. Difficult situation with anticoagulation. Patient converted spontaneously. She had a pause of 6.8 sec. She didn't received any medications. Cardiology managing. No good candidate for pacer per cardio evaluation.  -now sinus rhythm.  -Cardiac arrest (PEA)  -Recurrent cardiorespiratory arrest on dialysis - Multiple times - etiology not fully determined,  question carotid stenosis, severe pulm HTN or severe CAD may be contributing when she is becoming hypotensivie during dialysis sessions, cardiac arrytmia. -s/p AFib with RVR and hypotension, followed by 6.8sec pause 1-23.  -Leukocytosis; Monitor, mildly increased.   -Chest pain: resolved.  -Musculoskeletal likely due to CPR with chest wall tenderness improving:  - Placed her on tramadol as needed and Tylenol 500mg  TID.   -ESRD (end stage renal disease)  - per nephrology.   -Acute respiratory failure with hypoxia/ Pulmonary edema/ diastolic CHF  - s/p arrest  - nephrology team to manage fluid balance   Carotid artery stenosis -  1-39% stenosis involving the left internal carotid artery. - Findings consistent with 60-79% right ICA stenosis  - interventional radiology has been consulted, cerebral angiogram done today by Dr. Corliss Skains.  Findings. Severe stenosis of LT ICA pet cavernous junction of 95% , 60 % stenosis of RT ICA prox.. 50 % steosis of LT ICA petrous seg. Occluded RT VBJ -Plan for intracranial angiogram and or possible stent next week. Dr Roney Jaffe agree with plavix and aspirin prior to procedure. Monitor CBC.  Patient needs to be on plavix and aspirin for procedure for at least 5 days.  -Day 2 plavix, aspirin.    Severe Pulmonary HTN with moderate TR, CAD  - cath in 2011 revealed diffuse distal disease   Right IJ thrombus  -found on a previous admission- not a candidate for anticoagulation due to PUD with bleed in 12/14 in setting of Coumadin   PUD - cont Pepcid   Hypertension  - Hydralazine  PRN. Careful with Hypotension.   Encephalopathy acute - likely due to anoxia secondary to cardiac arrest- may have new cognitive baseline-  - Levaquin started on 1/16- continue for 5 days . Finished course.  -Resolved.   Severe PAD with left BKA  - family brought in prosthesis in to allow pt to ambulate   DVT Prophylaxis: SCD, patient on plavix, aspirin. High risk for bleeding.   Code Status: Full code  Family Communication: Care discussed with patient.   Disposition: remain in step down.   SIGNIFICANT EVENTS / STUDIES:  1/12 cardiac arrest/ PEA at HD. 20 minutes estimated to ROSC  1/12 Echocardiogram: LVEF 50-55%. No RWMAs. Moderate LVH. Probably mild AS. Mild MR. RVSP est 69 mmHg  1/12 CT head: Age uncertain small infarct mid pons. Small infarct in this area may be recent.  1/13 Cognition much improved. Failed SBT  1/15 Extubated successfully  1/20: Cerebral angiogram done   Subjective: No complaints. She is feeling better. No chest pain or dyspnea. No abdominal pain. She has been eating  well.   Objective: Weight change: 2.1 kg (4 lb 10.1 oz)  Intake/Output Summary (Last 24 hours) at 10/03/13 5366 Last data filed at 10/03/13 0600  Gross per 24 hour  Intake   1310 ml  Output      0 ml  Net   1310 ml   Blood pressure 125/54, pulse 83, temperature 98.4 F (36.9 C), temperature source Oral, resp. rate 16, height 5\' 6"  (1.676 m), weight 82.1 kg (181 lb), SpO2 98.00%.  Physical Exam: General: Alert and awake, oriented CVS: S1-S2 clear Chest: CTAB Abdomen: soft nontender, nondistended, normal bowel sounds  Extremities: no cyanosis, clubbing left BKA,   Lab Results: Basic Metabolic Panel:  Recent Labs Lab 09/29/13 0805 10/02/13 0730  NA 139 141  K 4.9 4.5  CL 100 97  CO2 25 27  GLUCOSE 141* 195*  BUN 22 19  CREATININE 5.35* 4.45*  CALCIUM 9.8 10.1  PHOS 3.4  --    Liver Function Tests:  Recent Labs Lab 09/26/13 1337 09/29/13 0805  ALBUMIN 3.3* 3.0*   No results found for this basename: LIPASE, AMYLASE,  in the last 168 hours No results found for this basename: AMMONIA,  in the last 168 hours CBC:  Recent Labs Lab 09/29/13 0805 10/02/13 0730  WBC 11.7* 12.4*  HGB 8.9* 8.5*  HCT 27.4* 25.9*  MCV 97.9 94.2  PLT 226 222   Cardiac Enzymes: No results found for this basename: CKTOTAL, CKMB, CKMBINDEX, TROPONINI,  in the last 168 hours BNP: No components found with this basename: POCBNP,  CBG:  Recent Labs Lab 10/01/13 2129 10/02/13 0832 10/02/13 1231 10/02/13 1704 10/02/13 2120  GLUCAP 125* 139* 197* 181* 152*     Micro Results: No results found for this or any previous visit (from the past 240 hour(s)).  Studies/Results: Ct Head Wo Contrast  09/20/2013   CLINICAL DATA:  Altered mental status ; status post cardiopulmonary resuscitation  EXAM: CT HEAD WITHOUT CONTRAST  TECHNIQUE: Contiguous axial images were obtained from the base of the skull through the vertex without intravenous contrast.  COMPARISON:  None.  FINDINGS: There is  age related volume loss. There is no demonstrable mass, hemorrhage, extra-axial fluid collection, or midline shift. There is mild small vessel disease in the centra semiovale bilaterally. There is decreased attenuation in the midline of the upper to mid pons which may represent a small recent infarct in this area. No other acute appearing infarct is appreciable.  Bony calvarium appears intact.  The mastoid air cells are clear.  IMPRESSION: Age uncertain small infarct mid pons. Small infarct in this area may be recent.  There is underlying age related volume loss with mild periventricular small vessel disease. There is no intracranial mass or hemorrhage. No gray-white compartment edema is appreciable on this study.   Electronically Signed   By: Bretta Bang M.D.   On: 09/20/2013 12:54   Dg Chest Port 1 View  09/25/2013   CLINICAL DATA:  Edema after dialysis.  EXAM: PORTABLE CHEST - 1 VIEW  COMPARISON:  One-view chest 09/24/2013.  FINDINGS:  Cardiac enlargement is stable. A left IJ line is stable in position. Interstitial edema is improved. No focal airspace consolidation is evident.  IMPRESSION: 1. Improved interstitial edema. 2. Stable cardiomegaly.   Electronically Signed   By: Gennette Pac M.D.   On: 09/25/2013 21:55   Dg Chest Port 1 View  09/24/2013   CLINICAL DATA:  Respiratory failure  EXAM: PORTABLE CHEST - 1 VIEW  COMPARISON:  09/23/2013  FINDINGS: Interval tracheal and esophageal extubation. Left IJ catheter in stable position, tip at the superior cavoatrial junction.  Unchanged cardiomegaly and pulmonary edema. These changes could obscure superimposed infection. No evidence of increased pleural fluid. No pneumothorax.  IMPRESSION: 1. Stable lung volumes after tracheal extubation. 2. Unchanged pulmonary edema, which could obscure superimposed consolidation.   Electronically Signed   By: Tiburcio Pea M.D.   On: 09/24/2013 06:23   Dg Chest Port 1 View  09/23/2013   CLINICAL DATA:   Respiratory failure.  EXAM: PORTABLE CHEST - 1 VIEW  COMPARISON:  09/22/2013.  FINDINGS: Endotracheal tube, left IJ line, NG tube in stable position. Persistent cardiomegaly and pulmonary venous congestion present. Interstitial prominence present. These findings consistent with congestive heart failure. Left base atelectasis and/or infiltrate present. Left pleural effusion has improved. No pneumothorax.  IMPRESSION: 1. Stable line and tube positions. 2. Findings consistent with congestive heart failure with pulmonary interstitial edema again noted. Left pleural effusion has improved. 3. Left lower lobe atelectasis and/or infiltrate.   Electronically Signed   By: Maisie Fus  Register   On: 09/23/2013 07:36   Dg Chest Port 1 View  09/22/2013   CLINICAL DATA:  Respiratory failure.  EXAM: PORTABLE CHEST - 1 VIEW  COMPARISON:  Chest radiograph September 21, 2013  FINDINGS: Endotracheal tube tip projects 4.2 cm above the carina. Left internal jugular central venous catheter with distal tip at the cavoatrial junction. Nasogastric tube past the gastroesophageal junction region code though, tip is not visualized. No pneumothorax.  Stable cardiomegaly, worsening interstitial edema, with minimal patchy airspace opacity in left lung base, unchanged. Suspected small left pleural effusion. Soft tissue planes and included osseous structures are unchanged.  IMPRESSION: No apparent change in life-support lines.  Stable cardiomegaly with slightly worsening interstitial edema. Retrocardiac airspace opacity may reflect atelectasis or confluent edema with suspected small left pleural effusion.   Electronically Signed   By: Awilda Metro   On: 09/22/2013 06:31   Dg Chest Port 1 View  09/21/2013   CLINICAL DATA:  Respiratory distress.  Intubated patient.  EXAM: PORTABLE CHEST - 1 VIEW  COMPARISON:  09/20/2013  FINDINGS: Endotracheal tube tip lies 3 cm above the carina. Nasogastric tube passes below the diaphragm well into the stomach.  Left internal jugular central venous line tip lies the caval atrial junction.  Mild hazy airspace opacity is noted in the medial right lung base. This may reflect atelectasis. Infiltrate is possible. Lungs are otherwise clear. No pleural effusion or pneumothorax  IMPRESSION: 1. Support apparatus is stable in well positioned. 2. Mild hazy opacity in the medial right lung base, likely atelectasis. This potentially could reflect a small area of infiltrate or even asymmetric edema. Lungs are otherwise clear. Findings are similar to the prior exam allowing for differences in patient positioning and technique.   Electronically Signed   By: Amie Portland M.D.   On: 09/21/2013 12:27   Dg Chest Port 1 View  09/20/2013   CLINICAL DATA:  Post IJ placement  EXAM: PORTABLE CHEST - 1 VIEW  COMPARISON:  Prior chest x-ray 09/20/2013 at 9:19 a.m.  FINDINGS: Interval placement of a left IJ approach central venous catheter. The tip of the catheter is in good position at the superior cavoatrial junction. No evidence of complicating pneumothorax. The patient remains intubated. The tip of the endotracheal tube is 4 cm above the carina. A nasogastric tube is present, the tip lies off the field of view presumably within the stomach. External defibrillator pads and metallic artifacts overlie the chest. Stable cardiomegaly, very low inspiratory volumes and mild pulmonary edema.  IMPRESSION: 1. New left IJ approach central venous catheter with the tip at the superior cavoatrial junction. No evidence of pneumothorax or other complication. 2. Otherwise, no significant interval change in the appearance of the chest.   Electronically Signed   By: Malachy MoanHeath  McCullough M.D.   On: 09/20/2013 11:48   Dg Chest Portable 1 View  09/20/2013   CLINICAL DATA:  Status post intubation.  EXAM: PORTABLE CHEST - 1 VIEW  COMPARISON:  None.  FINDINGS: 0919 hrs. Endotracheal tube tip is 3.8 cm above the base of the carina. The NG tube passes into the stomach  although the distal tip position is not included on the film. Lung volumes are low. The cardio pericardial silhouette is enlarged. Diffuse interstitial opacity is associated with vascular congestion. Telemetry leads overlie the chest. No evidence for pneumothorax or substantial pleural effusion.  IMPRESSION: Low volume film with enlargement of the cardiopericardial silhouette. Vascular congestion with interstitial opacities suggest associated edema.   Electronically Signed   By: Kennith CenterEric  Mansell M.D.   On: 09/20/2013 09:30    Medications: Scheduled Meds: . acetaminophen  500 mg Oral TID  . alteplase  2 mg Intracatheter Once  . aspirin  81 mg Oral Daily  . clopidogrel  75 mg Oral Q breakfast  . [START ON 10/06/2013] darbepoetin (ARANESP) injection - DIALYSIS  100 mcg Intravenous Q Wed-HD  . [START ON 10/04/2013] doxercalciferol  8 mcg Intravenous Q M,W,F-HD  . famotidine  20 mg Oral QHS  . heparin subcutaneous  5,000 Units Subcutaneous Q8H  . insulin aspart  0-9 Units Subcutaneous TID WC  . [START ON 10/04/2013] midodrine  5 mg Oral Q M,W,F-HD  . multivitamin  1 tablet Oral QHS  . sevelamer carbonate  1,600 mg Oral TID WC      LOS: 13 days   Monia Timmers M.D. [facility] 10/03/2013, 8:08 AM Pager: [phone]  If 7PM-7AM, please contact night-coverage [email or web address] Password [email or web address]

## 2013-10-04 LAB — GLUCOSE, CAPILLARY
Glucose-Capillary: 99 mg/dL (ref 70–99)
Glucose-Capillary: 217 mg/dL — ABNORMAL HIGH (ref 70–99)
Glucose-Capillary: 117 mg/dL — ABNORMAL HIGH (ref 70–99)

## 2013-10-04 LAB — CBC
HCT: 25.1 % — ABNORMAL LOW (ref 36.0–46.0)
Hemoglobin: 8.2 g/dL — ABNORMAL LOW (ref 12.0–15.0)
MCH: 30.9 pg (ref 26.0–34.0)
MCHC: 32.7 g/dL (ref 30.0–36.0)
MCV: 94.7 fL (ref 78.0–100.0)
Platelets: 226 10*3/uL (ref 150–400)
RBC: 2.65 MIL/uL — AB (ref 3.87–5.11)
RDW: 18.1 % — ABNORMAL HIGH (ref 11.5–15.5)
WBC: 11.2 10*3/uL — ABNORMAL HIGH (ref 4.0–10.5)

## 2013-10-04 LAB — RENAL FUNCTION PANEL
ALBUMIN: 3 g/dL — AB (ref 3.5–5.2)
BUN: 39 mg/dL — ABNORMAL HIGH (ref 6–23)
CALCIUM: 9.6 mg/dL (ref 8.4–10.5)
CHLORIDE: 92 meq/L — AB (ref 96–112)
CO2: 23 mEq/L (ref 19–32)
CREATININE: 6.84 mg/dL — AB (ref 0.50–1.10)
GFR calc Af Amer: 6 mL/min — ABNORMAL LOW (ref >90)
GFR, EST NON AFRICAN AMERICAN: 6 mL/min — AB (ref >90)
Glucose, Bld: 135 mg/dL — ABNORMAL HIGH (ref 70–99)
Phosphorus: 4 mg/dL (ref 2.3–4.6)
Potassium: 4.5 mEq/L (ref 3.7–5.3)
Sodium: 133 mEq/L — ABNORMAL LOW (ref 137–147)

## 2013-10-04 LAB — PLATELET INHIBITION P2Y12: PLATELET FUNCTION P2Y12: 384 [PRU] (ref 194–418)

## 2013-10-04 MED ORDER — DOXERCALCIFEROL 4 MCG/2ML IV SOLN
INTRAVENOUS | Status: AC
  Administered 2013-10-04: 8 ug via INTRAVENOUS
  Filled 2013-10-04: qty 4

## 2013-10-04 MED ORDER — NEPRO/CARBSTEADY PO LIQD
237.0000 mL | Freq: Three times a day (TID) | ORAL | Status: DC
  Administered 2013-10-05 – 2013-10-07 (×3): 237 mL via ORAL
  Filled 2013-10-04: qty 237

## 2013-10-04 NOTE — Procedures (Signed)
I was present at this dialysis session, have reviewed the session itself and made  appropriate changes  Rob Jaryn Hocutt MD (pgr) [phone]    (c) [phone] 10/04/2013, 10:30 AM   

## 2013-10-04 NOTE — Progress Notes (Signed)
OT Cancellation Note:  Pt currently in HD.  Will continue to follow. 10/04/2013 Martie RoundJulie Jeni Duling, OTR/L Pager: [phone]

## 2013-10-04 NOTE — Progress Notes (Signed)
Physical Therapy Treatment Patient Details Name: Briana Gould MRN: [id number] DOB: 20-Apr-1946 Today's Date: 10/04/2013 Time: [id number] PT Time Calculation (min): 25 min  PT Assessment / Plan / Recommendation  History of Present Illness 68 year old female w/ ESRD, [facility] 12/21 after being treated for UGIB while on coumadin for Right IJ thrombus stroke prevention. Was in usual state of health, getting stronger after her d/c. On 1/12 was at regular scheduled HD. About 20 minutes into HD she slumped over and became unresponsive. Initial rhythm was reported as PEA. CPR started. Reported 20 minutes CPR before ROSC, no drugs. Transferred to [facility] s/p arrest.    PT Comments   Despite setback with A-fib, pt encouraged by ability to ambulate with prosthesis today, 6918' with RW and min A. Pt currently refusing SNF. Has 24 hr assist at home, therefore recommend HHPT. Pt understands that she will not receive as much therapy as SNF and is in agreement with that. PT will continue to follow.  Follow Up Recommendations  Home health PT;Supervision/Assistance - 24 hour (pt refusing SNF)     Does the patient have the potential to tolerate intense rehabilitation     Barriers to Discharge        Equipment Recommendations  None recommended by PT    Recommendations for Other Services OT consult  Frequency Min 3X/week   Progress towards PT Goals Progress towards PT goals: Progressing toward goals  Plan Discharge plan needs to be updated    Precautions / Restrictions Precautions Precautions: Fall Required Braces or Orthoses: Other Brace/Splint Other Brace/Splint: Lt prosthesis Restrictions Weight Bearing Restrictions: No   Pertinent Vitals/Pain VSS    Mobility  Bed Mobility Overal bed mobility: Needs Assistance Bed Mobility: Supine to Sit Supine to sit: Supervision General bed mobility comments: improved ability to get EOB, no physical assist required, only use of rail and HOB  slightly elevated Transfers Overall transfer level: Needs assistance Equipment used: Rolling walker (2 wheeled) Transfers: Sit to/from Stand Sit to Stand: Min guard;Min assist General transfer comment: Pt stood from bed with min-guard A with prosthesis on, no physical assist required, however, needed min A to control descent with stand to sit. Pt very fatigued after short ambulation Ambulation/Gait Ambulation/Gait assistance: Min assist Ambulation Distance (Feet): 18 Feet Assistive device: Rolling walker (2 wheeled) Gait Pattern/deviations: Step-through pattern;Decreased stride length;Trunk flexed Gait velocity: decreased Gait velocity interpretation: <1.8 ft/sec, indicative of risk for recurrent falls General Gait Details: pt ambulated with left prosthesis (was able to don independently). vc's to extend trunk. Quick fatigue with ambulation though pt encouraged by ability to ambulate    Exercises     PT Diagnosis:    PT Problem List:   PT Treatment Interventions:     PT Goals (current goals can now be found in the care plan section) Acute Rehab PT Goals Patient Stated Goal: return home PT Goal Formulation: With patient Time For Goal Achievement: 10/10/13 Potential to Achieve Goals: Good  Visit Information  Last PT Received On: 10/04/13 Assistance Needed: +2 (safety) History of Present Illness: 68 year old female w/ ESRD, [facility] after being treated for UGIB while on coumadin for Right IJ thrombus stroke prevention. Was in usual state of health, getting stronger after her d/c. On 1/12 was at regular scheduled HD. About 20 minutes into HD she slumped over and became unresponsive. Initial rhythm was reported as PEA. CPR started. Reported 20 minutes CPR before ROSC, no drugs. Transferred  to [facility] s/p arrest.     Subjective Data  Subjective: pt eager to ambulate Patient Stated Goal: return home   Cognition  Cognition Arousal/Alertness: Awake/alert Behavior  During Therapy: WFL for tasks assessed/performed Overall Cognitive Status: Within Functional Limits for tasks assessed    Balance  Balance Overall balance assessment: Needs assistance Sitting balance-Leahy Scale: Good Standing balance support: Bilateral upper extremity supported;During functional activity Standing balance-Leahy Scale: Poor Standing balance comment: standing balance improved with prosthesis on but still requires UE support  End of Session PT - End of Session Equipment Utilized During Treatment: Gait belt;Other (comment) (LLE prosthesis) Activity Tolerance: Patient tolerated treatment well Patient left: in chair;with call bell/phone within reach Nurse Communication: Mobility status   GP   Lyanne Co, PT  Acute Rehab Services  [phone]   Lyanne Co 10/04/2013, 2:03 PM

## 2013-10-04 NOTE — Progress Notes (Signed)
10/04/2013 0650  To HD via bed. Alert and oriented.  Briana Gould, Briana Gould

## 2013-10-04 NOTE — Progress Notes (Signed)
  [facility] Progress Note   Subjective: No complaints, on HD   Filed Vitals:   10/04/13 0900 10/04/13 0930 10/04/13 1000 10/04/13 1029  BP: 152/82 161/84 156/80 145/79  Pulse: 93 92 90 89  Temp:      TempSrc:      Resp: 15 17 15 16   Height:      Weight:      SpO2:      Alert, on dialysis No jvd Chest clear bilat RRR no rub or gallop Abd soft, nt/nd, no ascites R BKA, L leg with 2+pitting edema to the knee Neuro is nf, ox3  Dialysis: MWF at [facility]  4.5h (4 hr here) 79kg BFR 250 (just started buttonhole to AVF) Profile 2 F180 No heparin  Hectorol 8 Epo 20K Venofer none   Assess/Plan 1. Recurrent cardioresp arrest on dialysis- has known pulm HTN, severe ASCVD. Cardiology did not suspect cardiac issue. Possible perc procedure of L ICA stenosis per IR/Dr Devashwar this week.  Pt declined recommendation for PD.   2. Severe ASCVD- as above, on DAP now 3. Pulm edema- better, but still with LE edema/vol excess. Need to avoid low BP's for now, readdress after above  4. ESRD- HD MWF, max UF 2-2.5kg, keep bp over 130, giving pre-HD midodrine 5mg  3x per wk. No hep d/t GIB in December. PD recommended but pt not interested. Other measures if pt with not do PD could include longer HD and more days per week.  5. HTN/volume- meds, vol control, as above 6. PAD / L BKA 7. CAD, diffuse small vessel dz by cath 2011 8. MBD/CKD- cont binders, vit D 9. Anemia/CKD- darbe 100/wk, tsat low but high ferriting so no IV Fe for now   Vinson Moselleob Ndeye Tenorio MD pager [phone]    cell [phone] 10/04/2013, 11:02 AM   Recent Labs Lab 09/29/13 0805 10/02/13 0730 10/04/13 0728  NA 139 141 133*  K 4.9 4.5 4.5  CL 100 97 92*  CO2 25 27 23   GLUCOSE 141* 195* 135*  BUN 22 19 39*  CREATININE 5.35* 4.45* 6.84*  CALCIUM 9.8 10.1 9.6  PHOS 3.4  --  4.0    Recent Labs Lab 09/29/13 0805 10/04/13 0728  ALBUMIN 3.0* 3.0*    Recent Labs Lab 10/02/13 0730 10/03/13 1405 10/04/13 0323   WBC 12.4* 12.0* 11.2*  HGB 8.5* 8.3* 8.2*  HCT 25.9* 25.8* 25.1*  MCV 94.2 96.3 94.7  PLT 222 238 226   . acetaminophen  500 mg Oral TID  . alteplase  2 mg Intracatheter Once  . aspirin  81 mg Oral Daily  . clopidogrel  75 mg Oral Q breakfast  . [START ON 10/06/2013] darbepoetin (ARANESP) injection - DIALYSIS  100 mcg Intravenous Q Wed-HD  . doxercalciferol  8 mcg Intravenous Q M,W,F-HD  . famotidine  20 mg Oral QHS  . heparin subcutaneous  5,000 Units Subcutaneous Q8H  . insulin aspart  0-9 Units Subcutaneous TID WC  . midodrine  5 mg Oral Q M,W,F-HD  . multivitamin  1 tablet Oral QHS  . sevelamer carbonate  1,600 mg Oral TID WC   . sodium chloride 20 mL/hr at 10/03/13 0800   sodium chloride, acetaminophen, aluminum hydroxide, bisacodyl, diphenhydrAMINE, feeding supplement (NEPRO CARB STEADY), guaiFENesin-dextromethorphan, hydrALAZINE, ondansetron (ZOFRAN) IV, prochlorperazine, prochlorperazine, prochlorperazine, RESOURCE THICKENUP CLEAR, sodium chloride, traMADol

## 2013-10-04 NOTE — Progress Notes (Signed)
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 Readmitted for PEA arrest while on dialysis.    Assessment/Plan:  -A fib: RVR with subsequent cardiac pause 6.8 sec. ; Patient Blood pressure decrease during dialysis 1-23. HR increased. She received IV bolus. Cardiology consulted. She was on coumadin at some point but this was stop due to bleed. Difficult situation with anticoagulation. Patient converted spontaneously. She had a pause of 6.8 sec. She didn't received any medications. Cardiology managing. No good candidate for pacer per cardio evaluation.  -now sinus rhythm.  -Cardiac arrest (PEA)  -Recurrent cardiorespiratory arrest on dialysis - Multiple times - etiology not fully determined,  question carotid stenosis, severe pulm HTN or severe CAD may be contributing when she is becoming hypotensivie during dialysis sessions, cardiac arrytmia. -s/p AFib with RVR and hypotension, followed by 6.8sec pause 1-23. -No further evaluation per cardio.   -Leukocytosis; Monitor, mildly increased.   -Chest pain: resolved.  -Musculoskeletal likely due to CPR with chest wall tenderness improving:  - Placed her on tramadol as needed and Tylenol 500mg  TID.   -ESRD (end stage renal disease)  - per nephrology.   -Acute respiratory failure with hypoxia/ Pulmonary edema/ diastolic CHF  - s/p arrest  - nephrology team to manage fluid  balance   Carotid artery stenosis - 1-39% stenosis involving the left internal carotid artery. - Findings consistent with 60-79% right ICA stenosis  - interventional radiology has been consulted, cerebral angiogram done today by Dr. Corliss Skains.  Findings. Severe stenosis of LT ICA pet cavernous junction of 95% , 60 % stenosis of RT ICA prox.. 50 % steosis of LT ICA petrous seg. Occluded RT VBJ -Plan for intracranial angiogram and or possible stent next week. Dr Roney Jaffe agree with plavix and aspirin prior to procedure. Monitor CBC.  Patient needs to be on plavix and aspirin for procedure for at least 5 days.  -Day 3 plavix, aspirin.    Severe Pulmonary HTN with moderate TR, CAD  - cath in 2011 revealed diffuse distal disease   Right IJ thrombus  -found on a previous admission- not a candidate for anticoagulation due to PUD with bleed in 12/14 in setting of Coumadin   PUD - cont Pepcid   Hypertension  - Hydralazine  PRN. Careful with Hypotension.   Encephalopathy acute - likely due to anoxia secondary to cardiac arrest- may have new cognitive baseline-  - Levaquin started on 1/16- continue for 5 days . Finished course.  -Resolved.   Severe PAD with left BKA  - family brought in prosthesis in to allow pt to ambulate   DVT Prophylaxis: SCD, patient on plavix, aspirin. High risk for bleeding.   Code Status: Full code  Family Communication: Care discussed with patient.   Disposition: Transfer to telemetry. CIR consult  SIGNIFICANT EVENTS / STUDIES:  1/12 cardiac arrest/ PEA at HD.  20 minutes estimated to ROSC  1/12 Echocardiogram: LVEF 50-55%. No RWMAs. Moderate LVH. Probably mild AS. Mild MR. RVSP est 69 mmHg  1/12 CT head: Age uncertain small infarct mid pons. Small infarct in this area may be recent.  1/13 Cognition much improved. Failed SBT  1/15 Extubated successfully  1/20: Cerebral angiogram done   Subjective: Relates low energy. No chest pain or dyspnea. No  abdominal pain.   Objective: Weight change: 3 kg (6 lb 9.8 oz)  Intake/Output Summary (Last 24 hours) at 10/04/13 1540 Last data filed at 10/04/13 1400  Gross per 24 hour  Intake    870 ml  Output   2000 ml  Net  -1130 ml   Blood pressure 155/64, pulse 80, temperature 98 F (36.7 C), temperature source Oral, resp. rate 18, height 5\' 6"  (1.676 m), weight 78.5 kg (173 lb 1 oz), SpO2 98.00%.  Physical Exam: General: Alert and awake, oriented CVS: S1-S2 clear Chest: CTAB Abdomen: soft nontender, nondistended, normal bowel sounds  Extremities: no cyanosis, clubbing left BKA,   Lab Results: Basic Metabolic Panel:  Recent Labs Lab 10/02/13 0730 10/04/13 0728  NA 141 133*  K 4.5 4.5  CL 97 92*  CO2 27 23  GLUCOSE 195* 135*  BUN 19 39*  CREATININE 4.45* 6.84*  CALCIUM 10.1 9.6  PHOS  --  4.0   Liver Function Tests:  Recent Labs Lab 09/29/13 0805 10/04/13 0728  ALBUMIN 3.0* 3.0*   No results found for this basename: LIPASE, AMYLASE,  in the last 168 hours No results found for this basename: AMMONIA,  in the last 168 hours CBC:  Recent Labs Lab 10/03/13 1405 10/04/13 0323  WBC 12.0* 11.2*  HGB 8.3* 8.2*  HCT 25.8* 25.1*  MCV 96.3 94.7  PLT 238 226   Cardiac Enzymes: No results found for this basename: CKTOTAL, CKMB, CKMBINDEX, TROPONINI,  in the last 168 hours BNP: No components found with this basename: POCBNP,  CBG:  Recent Labs Lab 10/03/13 0828 10/03/13 1205 10/03/13 1744 10/03/13 2126 10/04/13 1209  GLUCAP 133* 184* 138* 143* 99     Micro Results: No results found for this or any previous visit (from the past 240 hour(s)).  Studies/Results: Ct Head Wo Contrast  09/20/2013   CLINICAL DATA:  Altered mental status ; status post cardiopulmonary resuscitation  EXAM: CT HEAD WITHOUT CONTRAST  TECHNIQUE: Contiguous axial images were obtained from the base of the skull through the vertex without intravenous contrast.  COMPARISON:  None.  FINDINGS:  There is age related volume loss. There is no demonstrable mass, hemorrhage, extra-axial fluid collection, or midline shift. There is mild small vessel disease in the centra semiovale bilaterally. There is decreased attenuation in the midline of the upper to mid pons which may represent a small recent infarct in this area. No other acute appearing infarct is appreciable.  Bony calvarium appears intact.  The mastoid air cells are clear.  IMPRESSION: Age uncertain small infarct mid pons. Small infarct in this area may be recent.  There is underlying age related volume loss with mild periventricular small vessel disease. There is no intracranial mass or hemorrhage. No gray-white compartment edema is appreciable on this study.   Electronically Signed   By: Bretta BangWilliam  Woodruff M.D.   On: 09/20/2013 12:54   Dg Chest Port 1 View  09/25/2013   CLINICAL DATA:  Edema after dialysis.  EXAM: PORTABLE CHEST - 1 VIEW  COMPARISON:  One-view chest 09/24/2013.  FINDINGS: Cardiac enlargement is  stable. A left IJ line is stable in position. Interstitial edema is improved. No focal airspace consolidation is evident.  IMPRESSION: 1. Improved interstitial edema. 2. Stable cardiomegaly.   Electronically Signed   By: Gennette Pac M.D.   On: 09/25/2013 21:55   Dg Chest Port 1 View  09/24/2013   CLINICAL DATA:  Respiratory failure  EXAM: PORTABLE CHEST - 1 VIEW  COMPARISON:  09/23/2013  FINDINGS: Interval tracheal and esophageal extubation. Left IJ catheter in stable position, tip at the superior cavoatrial junction.  Unchanged cardiomegaly and pulmonary edema. These changes could obscure superimposed infection. No evidence of increased pleural fluid. No pneumothorax.  IMPRESSION: 1. Stable lung volumes after tracheal extubation. 2. Unchanged pulmonary edema, which could obscure superimposed consolidation.   Electronically Signed   By: Tiburcio Pea M.D.   On: 09/24/2013 06:23   Dg Chest Port 1 View  09/23/2013   CLINICAL DATA:   Respiratory failure.  EXAM: PORTABLE CHEST - 1 VIEW  COMPARISON:  09/22/2013.  FINDINGS: Endotracheal tube, left IJ line, NG tube in stable position. Persistent cardiomegaly and pulmonary venous congestion present. Interstitial prominence present. These findings consistent with congestive heart failure. Left base atelectasis and/or infiltrate present. Left pleural effusion has improved. No pneumothorax.  IMPRESSION: 1. Stable line and tube positions. 2. Findings consistent with congestive heart failure with pulmonary interstitial edema again noted. Left pleural effusion has improved. 3. Left lower lobe atelectasis and/or infiltrate.   Electronically Signed   By: Maisie Fus  Register   On: 09/23/2013 07:36   Dg Chest Port 1 View  09/22/2013   CLINICAL DATA:  Respiratory failure.  EXAM: PORTABLE CHEST - 1 VIEW  COMPARISON:  Chest radiograph September 21, 2013  FINDINGS: Endotracheal tube tip projects 4.2 cm above the carina. Left internal jugular central venous catheter with distal tip at the cavoatrial junction. Nasogastric tube past the gastroesophageal junction region code though, tip is not visualized. No pneumothorax.  Stable cardiomegaly, worsening interstitial edema, with minimal patchy airspace opacity in left lung base, unchanged. Suspected small left pleural effusion. Soft tissue planes and included osseous structures are unchanged.  IMPRESSION: No apparent change in life-support lines.  Stable cardiomegaly with slightly worsening interstitial edema. Retrocardiac airspace opacity may reflect atelectasis or confluent edema with suspected small left pleural effusion.   Electronically Signed   By: Awilda Metro   On: 09/22/2013 06:31   Dg Chest Port 1 View  09/21/2013   CLINICAL DATA:  Respiratory distress.  Intubated patient.  EXAM: PORTABLE CHEST - 1 VIEW  COMPARISON:  09/20/2013  FINDINGS: Endotracheal tube tip lies 3 cm above the carina. Nasogastric tube passes below the diaphragm well into the stomach.  Left internal jugular central venous line tip lies the caval atrial junction.  Mild hazy airspace opacity is noted in the medial right lung base. This may reflect atelectasis. Infiltrate is possible. Lungs are otherwise clear. No pleural effusion or pneumothorax  IMPRESSION: 1. Support apparatus is stable in well positioned. 2. Mild hazy opacity in the medial right lung base, likely atelectasis. This potentially could reflect a small area of infiltrate or even asymmetric edema. Lungs are otherwise clear. Findings are similar to the prior exam allowing for differences in patient positioning and technique.   Electronically Signed   By: Amie Portland M.D.   On: 09/21/2013 12:27   Dg Chest Port 1 View  09/20/2013   CLINICAL DATA:  Post IJ placement  EXAM: PORTABLE CHEST - 1 VIEW  COMPARISON:  Prior chest  x-ray 09/20/2013 at 9:19 a.m.  FINDINGS: Interval placement of a left IJ approach central venous catheter. The tip of the catheter is in good position at the superior cavoatrial junction. No evidence of complicating pneumothorax. The patient remains intubated. The tip of the endotracheal tube is 4 cm above the carina. A nasogastric tube is present, the tip lies off the field of view presumably within the stomach. External defibrillator pads and metallic artifacts overlie the chest. Stable cardiomegaly, very low inspiratory volumes and mild pulmonary edema.  IMPRESSION: 1. New left IJ approach central venous catheter with the tip at the superior cavoatrial junction. No evidence of pneumothorax or other complication. 2. Otherwise, no significant interval change in the appearance of the chest.   Electronically Signed   By: Malachy Moan M.D.   On: 09/20/2013 11:48   Dg Chest Portable 1 View  09/20/2013   CLINICAL DATA:  Status post intubation.  EXAM: PORTABLE CHEST - 1 VIEW  COMPARISON:  None.  FINDINGS: 0919 hrs. Endotracheal tube tip is 3.8 cm above the base of the carina. The NG tube passes into the stomach  although the distal tip position is not included on the film. Lung volumes are low. The cardio pericardial silhouette is enlarged. Diffuse interstitial opacity is associated with vascular congestion. Telemetry leads overlie the chest. No evidence for pneumothorax or substantial pleural effusion.  IMPRESSION: Low volume film with enlargement of the cardiopericardial silhouette. Vascular congestion with interstitial opacities suggest associated edema.   Electronically Signed   By: Kennith Center M.D.   On: 09/20/2013 09:30    Medications: Scheduled Meds: . acetaminophen  500 mg Oral TID  . alteplase  2 mg Intracatheter Once  . aspirin  81 mg Oral Daily  . clopidogrel  75 mg Oral Q breakfast  . [START ON 10/06/2013] darbepoetin (ARANESP) injection - DIALYSIS  100 mcg Intravenous Q Wed-HD  . doxercalciferol  8 mcg Intravenous Q M,W,F-HD  . famotidine  20 mg Oral QHS  . heparin subcutaneous  5,000 Units Subcutaneous Q8H  . insulin aspart  0-9 Units Subcutaneous TID WC  . midodrine  5 mg Oral Q M,W,F-HD  . multivitamin  1 tablet Oral QHS  . sevelamer carbonate  1,600 mg Oral TID WC      LOS: 14 days   Ayahna Solazzo M.D. [facility] 10/04/2013, 3:40 PM Pager: [phone]  If 7PM-7AM, please contact night-coverage [email or web address] Password [email or web address]

## 2013-10-04 NOTE — Progress Notes (Signed)
DAILY PROGRESS NOTE  Subjective:  No issues overnight. HR stable at 80, maintaining sinus.  Objective:  Temp:  [98 F (36.7 C)-99.9 F (37.7 C)] 98 F (36.7 C) (01/26 1156) Pulse Rate:  [80-93] 80 (01/26 1156) Resp:  [10-25] 18 (01/26 1156) BP: (138-168)/(48-103) 155/64 mmHg (01/26 1156) SpO2:  [98 %-100 %] 98 % (01/26 1156) Weight:  [173 lb 1 oz (78.5 kg)-187 lb 9.8 oz (85.1 kg)] 173 lb 1 oz (78.5 kg) (01/26 1128) Weight change: 6 lb 9.8 oz (3 kg)  Intake/Output from previous day: 01/25 0701 - 01/26 0700 In: 1000 [P.O.:720; I.V.:280] Out: -   Intake/Output from this shift: Total I/O In: 320 [I.V.:320] Out: 2000 [Other:2000]  Medications: Current Facility-Administered Medications  Medication Dose Route Frequency Provider Last Rate Last Dose  . 0.9 %  sodium chloride infusion   Intravenous Continuous PRN Wilhelmina Mcardle, MD 20 mL/hr at 10/03/13 0800    . acetaminophen (TYLENOL) tablet 325-650 mg  325-650 mg Oral Q4H PRN Bary Leriche, PA-C      . acetaminophen (TYLENOL) tablet 500 mg  500 mg Oral TID Ripudeep Krystal Eaton, MD   500 mg at 10/04/13 1212  . alteplase (CATHFLO ACTIVASE) injection 2 mg  2 mg Intracatheter Once Belkys A Regalado, MD      . aluminum hydroxide (AMPHOJEL/ALTERNAGEL) suspension 15 mL  15 mL Oral Q6H PRN Bary Leriche, PA-C      . aspirin chewable tablet 81 mg  81 mg Oral Daily Belkys A Regalado, MD   81 mg at 10/04/13 1212  . bisacodyl (DULCOLAX) suppository 10 mg  10 mg Rectal Daily PRN Bary Leriche, PA-C      . clopidogrel (PLAVIX) tablet 75 mg  75 mg Oral Q breakfast Belkys A Regalado, MD   75 mg at 10/04/13 1212  . [START ON 10/06/2013] darbepoetin (ARANESP) injection 100 mcg  100 mcg Intravenous Q Wed-HD Ivan Anchors Love, PA-C      . diphenhydrAMINE (BENADRYL) 12.5 MG/5ML elixir 12.5-25 mg  12.5-25 mg Oral Q6H PRN Bary Leriche, PA-C      . doxercalciferol (HECTOROL) injection 8 mcg  8 mcg Intravenous Q M,W,F-HD Ivan Anchors Love, PA-C   8 mcg at 10/04/13  0854  . famotidine (PEPCID) tablet 20 mg  20 mg Oral QHS Ivan Anchors Love, PA-C   20 mg at 10/03/13 2213  . feeding supplement (NEPRO CARB STEADY) liquid 237 mL  237 mL Oral PRN Ivan Anchors Love, PA-C      . guaiFENesin-dextromethorphan (ROBITUSSIN DM) 100-10 MG/5ML syrup 5-10 mL  5-10 mL Oral Q6H PRN Bary Leriche, PA-C      . heparin injection 5,000 Units  5,000 Units Subcutaneous Q8H Fay Records, MD   5,000 Units at 10/04/13 2536  . hydrALAZINE (APRESOLINE) injection 20 mg  20 mg Intravenous Q4H PRN Erick Colace, NP   20 mg at 09/24/13 0046  . insulin aspart (novoLOG) injection 0-9 Units  0-9 Units Subcutaneous TID WC Belkys A Regalado, MD   1 Units at 10/03/13 1753  . midodrine (PROAMATINE) tablet 5 mg  5 mg Oral Q M,W,F-HD Ivan Anchors Love, PA-C   5 mg at 10/04/13 1212  . multivitamin (RENA-VIT) tablet 1 tablet  1 tablet Oral QHS Bary Leriche, PA-C   1 tablet at 10/03/13 2213  . ondansetron (ZOFRAN) injection 4 mg  4 mg Intravenous Q6H PRN Raylene Miyamoto, MD   4 mg at 09/25/13 0446  .  prochlorperazine (COMPAZINE) tablet 5-10 mg  5-10 mg Oral Q6H PRN Bary Leriche, PA-C       Or  . prochlorperazine (COMPAZINE) injection 5-10 mg  5-10 mg Intramuscular Q6H PRN Bary Leriche, PA-C       Or  . prochlorperazine (COMPAZINE) suppository 12.5 mg  12.5 mg Rectal Q6H PRN Bary Leriche, PA-C      . RESOURCE THICKENUP CLEAR   Oral PRN Bary Leriche, PA-C      . sevelamer carbonate (RENVELA) tablet 1,600 mg  1,600 mg Oral TID WC Ivan Anchors Love, PA-C   1,600 mg at 10/04/13 1212  . sodium chloride 0.9 % injection 10-40 mL  10-40 mL Intracatheter PRN Belkys A Regalado, MD   10 mL at 09/29/13 1430  . traMADol (ULTRAM) tablet 50 mg  50 mg Oral Q8H PRN Bary Leriche, PA-C        Physical Exam: General appearance: alert and no distress Neck: no carotid bruit and no JVD Lungs: clear to auscultation bilaterally Heart: regular rate and rhythm, S1, S2 normal, no murmur, click, rub or gallop Abdomen: soft,  non-tender; bowel sounds normal; no masses,  no organomegaly Extremities: extremities normal, atraumatic, no cyanosis or edema Pulses: 2+ and symmetric Skin: Skin color, texture, turgor normal. No rashes or lesions Neurologic: Grossly normal Psych: Pleasant  Lab Results: Results for orders placed during the hospital encounter of 09/20/13 (from the past 48 hour(s))  GLUCOSE, CAPILLARY     Status: Abnormal   Collection Time    10/02/13  5:04 PM      Result Value Range   Glucose-Capillary 181 (*) 70 - 99 mg/dL  GLUCOSE, CAPILLARY     Status: Abnormal   Collection Time    10/02/13  9:20 PM      Result Value Range   Glucose-Capillary 152 (*) 70 - 99 mg/dL  GLUCOSE, CAPILLARY     Status: Abnormal   Collection Time    10/03/13  8:28 AM      Result Value Range   Glucose-Capillary 133 (*) 70 - 99 mg/dL  GLUCOSE, CAPILLARY     Status: Abnormal   Collection Time    10/03/13 12:05 PM      Result Value Range   Glucose-Capillary 184 (*) 70 - 99 mg/dL  CBC     Status: Abnormal   Collection Time    10/03/13  2:05 PM      Result Value Range   WBC 12.0 (*) 4.0 - 10.5 K/uL   RBC 2.68 (*) 3.87 - 5.11 MIL/uL   Hemoglobin 8.3 (*) 12.0 - 15.0 g/dL   HCT 25.8 (*) 36.0 - 46.0 %   MCV 96.3  78.0 - 100.0 fL   MCH 31.0  26.0 - 34.0 pg   MCHC 32.2  30.0 - 36.0 g/dL   RDW 18.4 (*) 11.5 - 15.5 %   Platelets 238  150 - 400 K/uL  GLUCOSE, CAPILLARY     Status: Abnormal   Collection Time    10/03/13  5:44 PM      Result Value Range   Glucose-Capillary 138 (*) 70 - 99 mg/dL  GLUCOSE, CAPILLARY     Status: Abnormal   Collection Time    10/03/13  9:26 PM      Result Value Range   Glucose-Capillary 143 (*) 70 - 99 mg/dL  CBC     Status: Abnormal   Collection Time    10/04/13  3:23 AM  Result Value Range   WBC 11.2 (*) 4.0 - 10.5 K/uL   RBC 2.65 (*) 3.87 - 5.11 MIL/uL   Hemoglobin 8.2 (*) 12.0 - 15.0 g/dL   HCT 25.1 (*) 36.0 - 46.0 %   MCV 94.7  78.0 - 100.0 fL   MCH 30.9  26.0 - 34.0 pg    MCHC 32.7  30.0 - 36.0 g/dL   RDW 18.1 (*) 11.5 - 15.5 %   Platelets 226  150 - 400 K/uL  RENAL FUNCTION PANEL     Status: Abnormal   Collection Time    10/04/13  7:28 AM      Result Value Range   Sodium 133 (*) 137 - 147 mEq/L   Potassium 4.5  3.7 - 5.3 mEq/L   Chloride 92 (*) 96 - 112 mEq/L   CO2 23  19 - 32 mEq/L   Glucose, Bld 135 (*) 70 - 99 mg/dL   BUN 39 (*) 6 - 23 mg/dL   Comment: REPEATED TO VERIFY   Creatinine, Ser 6.84 (*) 0.50 - 1.10 mg/dL   Comment: REPEATED TO VERIFY   Calcium 9.6  8.4 - 10.5 mg/dL   Phosphorus 4.0  2.3 - 4.6 mg/dL   Albumin 3.0 (*) 3.5 - 5.2 g/dL   GFR calc non Af Amer 6 (*) >90 mL/min   GFR calc Af Amer 6 (*) >90 mL/min   Comment: (NOTE)     The eGFR has been calculated using the CKD EPI equation.     This calculation has not been validated in all clinical situations.     eGFR's persistently <90 mL/min signify possible Chronic Kidney     Disease.  GLUCOSE, CAPILLARY     Status: None   Collection Time    10/04/13 12:09 PM      Result Value Range   Glucose-Capillary 99  70 - 99 mg/dL    Imaging: No results found.  Assessment:  1. Principal Problem: 2.   Cardiac arrest 3. Active Problems: 4.   ESRD (end stage renal disease) 5.   Acute respiratory failure with hypoxia 6.   Hypertension 7.   Encephalopathy acute 8.   H/O: GI bleed 9.   CVA (cerebral infarction) 10.   Paroxysmal a-fib 11.   Plan:  1. Maintaining sinus. No chest pain or shortness of breath. Suspect her arrest was not primarily cardiac. She is appropriate to transfer out of the CCU today from my standpoint.  Should be okay for carotid stent procedure.  Will sign-off for now and be available as needed. Call with questions.   Time Spent Directly with Patient:  15 minutes  Length of Stay:  LOS: 14 days   Pixie Casino, MD, [facility] Attending Cardiologist [facility]  HILTY,Kenneth C 10/04/2013, 1:11 PM

## 2013-10-05 LAB — GLUCOSE, CAPILLARY
GLUCOSE-CAPILLARY: 125 mg/dL — AB (ref 70–99)
GLUCOSE-CAPILLARY: 165 mg/dL — AB (ref 70–99)
Glucose-Capillary: 155 mg/dL — ABNORMAL HIGH (ref 70–99)
Glucose-Capillary: 192 mg/dL — ABNORMAL HIGH (ref 70–99)

## 2013-10-05 MED ORDER — SODIUM CHLORIDE 0.9 % IV SOLN
Freq: Once | INTRAVENOUS | Status: AC
  Administered 2013-10-06: 23:00:00 via INTRAVENOUS

## 2013-10-05 MED ORDER — HEPARIN SODIUM (PORCINE) 5000 UNIT/ML IJ SOLN: 5000.0000 [IU] | Freq: Three times a day (TID) | INTRAMUSCULAR | Status: DC

## 2013-10-05 MED ORDER — NIMODIPINE 30 MG PO CAPS
60.0000 mg | ORAL_CAPSULE | ORAL | Status: AC
  Administered 2013-10-07: 60 mg via ORAL
  Filled 2013-10-05: qty 2

## 2013-10-05 MED ORDER — HEPARIN SODIUM (PORCINE) 5000 UNIT/ML IJ SOLN
5000.0000 [IU] | Freq: Three times a day (TID) | INTRAMUSCULAR | Status: DC
Start: 2013-10-05 — End: 2013-10-05

## 2013-10-05 MED ORDER — HEPARIN SODIUM (PORCINE) 5000 UNIT/ML IJ SOLN
5000.0000 [IU] | Freq: Three times a day (TID) | INTRAMUSCULAR | Status: DC
  Administered 2013-10-05 – 2013-10-08 (×7): 5000 [IU] via SUBCUTANEOUS
  Filled 2013-10-05 (×12): qty 1

## 2013-10-05 MED ORDER — CEFAZOLIN SODIUM-DEXTROSE 2-3 GM-% IV SOLR
2.0000 g | Freq: Once | INTRAVENOUS | Status: AC
  Administered 2013-10-07: 2 g via INTRAVENOUS
  Filled 2013-10-05: qty 50

## 2013-10-05 NOTE — Progress Notes (Signed)
Physical Therapy Treatment Patient Details Name: Briana Gould MRN: [id number] DOB: 1946/06/08 Today's Date: 10/05/2013 Time: 4098-1191 PT Time Calculation (min): 24 min  PT Assessment / Plan / Recommendation  History of Present Illness 68 year old female w/ ESRD, [facility] 12[facility] after being treated for UGIB while on coumadin for Right IJ thrombus stroke prevention. Was in usual state of health, getting stronger after her d/c. On 1/12 was at regular scheduled HD. About 20 minutes into HD she slumped over and became unresponsive. Initial rhythm was reported as PEA. CPR started. Reported 20 minutes CPR before ROSC, no drugs. Transferred to [facility] s/p arrest. Of note, she has PMHx of left BKA and wears a prosthesis.     PT Comments   Pt is progressing well, but slowly with gait and endurance.  She did better with chair to follow today and seated rest break to encourage increased gait distance.  She would be an excellent inpatient rehab candidate and has the potential to get to supervision level that she would need to go home safely with husband who also uses a RW.    Follow Up Recommendations  CIR     Does the patient have the potential to tolerate intense rehabilitation    Yes  Barriers to Discharge   None      Equipment Recommendations  None recommended by PT    Recommendations for Other Services   None  Frequency Min 3X/week   Progress towards PT Goals Progress towards PT goals: Progressing toward goals  Plan Discharge plan needs to be updated    Precautions / Restrictions Precautions Precautions: Fall Required Braces or Orthoses: Other Brace/Splint Other Brace/Splint: Lt prosthesis Restrictions Weight Bearing Restrictions: No   Pertinent Vitals/Pain VSS throughout session.      Mobility  Transfers Overall transfer level: Needs assistance Equipment used: Rolling walker (2 wheeled) Transfers: Sit to/from Stand Sit to Stand: +2 physical assistance;Mod  assist General transfer comment: sit to stand x 2 during the session, first time mod assist using two people to get out of low recliner chair, second time once recliner elevated with multiple pillows only one person mod assist to support trunk to get to standing.   Ambulation/Gait Ambulation/Gait assistance: Min assist;+2 safety/equipment Ambulation Distance (Feet): 50 Feet (25' x 2) Assistive device: Rolling walker (2 wheeled) Gait Pattern/deviations: Step-through pattern;Trunk flexed Gait velocity: decreased Gait velocity interpretation: <1.8 ft/sec, indicative of risk for recurrent falls General Gait Details: Pt donned her prosthesis pre gait independently.  Walked with flexed posture and too close to the front of the RW, verbal cues to correct.  Limited endurance, so chair to follow for both seated rest break and to encourage increased gait distance.  VSS throughout session and on RA.      Exercises General Exercises - Lower Extremity Ankle Circles/Pumps: AROM;Right;10 reps;Seated     PT Goals (current goals can now be found in the care plan section) Acute Rehab PT Goals Patient Stated Goal: get out of here  Visit Information  Last PT Received On: 10/05/13 Assistance Needed: +1 History of Present Illness: 68 year old female w/ ESRD, [facility] after being treated for UGIB while on coumadin for Right IJ thrombus stroke prevention. Was in usual state of health, getting stronger after her d/c. On 1/12 was at regular scheduled HD. About 20 minutes into HD she slumped over and became unresponsive. Initial rhythm was reported as PEA. CPR started. Reported 20 minutes CPR before  ROSC, no drugs. Transferred to [facility] s/p arrest. Of note, she has PMHx of left BKA and wears a prosthesis.      Subjective Data  Subjective: Pt reports she is ready to walk.  Feeling stronger every day.   Patient Stated Goal: get out of here   Cognition  Cognition Arousal/Alertness:  Awake/alert Behavior During Therapy: WFL for tasks assessed/performed Overall Cognitive Status: Within Functional Limits for tasks assessed (not specifically tested)    Balance  Balance Standing balance support: Bilateral upper extremity supported Standing balance-Leahy Scale: Fair  End of Session PT - End of Session Equipment Utilized During Treatment: Gait belt;Other (comment) (L LE prosthesis) Activity Tolerance: Patient limited by fatigue Patient left: in chair;with call bell/phone within reach;with family/visitor present    Lurena Joinerebecca B. Janell Keeling, PT, DPT [phone]   10/05/2013, 1:13 PM

## 2013-10-05 NOTE — Progress Notes (Addendum)
I contacted Dr. Sunnie Nielsenegalado for clarification of when stent is planned which will assist in planning timing of admission to inpt rehab. Pt's LOS for ip rehab estimated at 7 days . Pt would be discharged from ip rehab to receive angio and possible stent therefore clarification needed. I will follow up today. [phone] Dr Patterson Hammersmithegaldao has clarified that angio and possible stent planned for Thursday. We will hold admission to ip rehab until after procedure. [phone]

## 2013-10-05 NOTE — Progress Notes (Signed)
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 Readmitted for PEA arrest while on dialysis. Patient cardiac arrest etiology is not fully determined, question carotid stenosis, severe pulm HTN or severe CAD may be contributing when she is becoming hypotensivie during dialysis sessions, cardiac arrytmia. Plan is for intracranial angiogram and or possible stent 1-29 to see if that help with circulation. Not safe to discharge patient to outpatient dialysis. Appreciate Dr Arlean Hopping help.   Assessment/Plan:  -A fib: RVR with subsequent cardiac pause 6.8 sec on 1-23 after dialysis. Patient Blood pressure decrease during dialysis 1-23. HR increased. She received IV bolus. Cardiology consulted. She was on coumadin at some point but this was stop due to bleed. Difficult situation with anticoagulation. Patient converted spontaneously. She had a pause of 6.8 sec. She didn't received any medications.  No good candidate for pacer per cardio evaluation. No further evaluation per cardio.  -now sinus rhythm.  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  - Multiple times - etiology not fully determined,  question carotid stenosis, severe pulm HTN or severe CAD may be contributing when she is becoming hypotensivie during dialysis sessions, cardiac arrytmia. -s/p AFib with RVR and hypotension, followed by 6.8sec pause  1-23. -No further evaluation per cardio.   Carotid artery stenosis - 1-39% stenosis involving the left internal carotid artery. - Findings consistent with 60-79% right ICA stenosis  - interventional radiology has been consulted, cerebral angiogram done  by Dr. Corliss Skains.  -Severe stenosis of LT ICA pet cavernous junction of 95% , 60 % stenosis of RT ICA prox.. 50 % steosis of LT ICA petrous seg. Occluded RT VBJ -Plan for intracranial angiogram and or possible stent 1-29. Dr Roney Jaffe agree with plavix and aspirin prior to procedure. Monitor CBC.  Patient needs to be on plavix and aspirin for procedure for at least 5 days.  -Day 4 plavix, aspirin.   -Leukocytosis; Monitor, mildly increased. Will remove central line today.   -Chest pain: resolved.  -Musculoskeletal likely due to CPR with chest wall tenderness improving:  - Placed her on tramadol as needed and Tylenol 500mg  TID.   -ESRD (end stage renal disease)  - per nephrology.   -Acute Respiratory failure with hypoxia/ Pulmonary edema/ diastolic CHF  - s/p arrest  - nephrology team to manage fluid balance    Severe Pulmonary HTN with moderate TR, CAD  - cath in 2011 revealed diffuse distal disease   Right IJ thrombus  -found on a previous admission- not a candidate for anticoagulation due to PUD with bleed in 12/14 in setting of Coumadin   PUD - cont Pepcid   Hypertension  - Hydralazine  PRN. Careful with Hypotension.   Encephalopathy acute - likely due to anoxia secondary to cardiac arrest- may have new cognitive baseline-  - Levaquin started on 1/16- continue for 5 days .  Finished course.  -Resolved.   Severe PAD with left BKA.   DVT Prophylaxis: SCD, patient on plavix, aspirin. High risk for bleeding.   Code Status: Full code  Family Communication: Care discussed with patient.   Disposition: Transfer to telemetry. CIR consult  SIGNIFICANT EVENTS / STUDIES:  1/12 cardiac arrest/ PEA at HD. 20 minutes  estimated to ROSC  1/12 Echocardiogram: LVEF 50-55%. No RWMAs. Moderate LVH. Probably mild AS. Mild MR. RVSP est 69 mmHg  1/12 CT head: Age uncertain small infarct mid pons. Small infarct in this area may be recent.  1/13 Cognition much improved. Failed SBT  1/15 Extubated successfully  1/20: Cerebral angiogram done   Subjective: Was up with therapy today. No chest pain or dyspnea. No abdominal pain.   Objective: Weight change: -3.5 kg (-7 lb 11.5 oz)  Intake/Output Summary (Last 24 hours) at 10/05/13 1355 Last data filed at 10/05/13 1235  Gross per 24 hour  Intake    740 ml  Output      0 ml  Net    740 ml   Blood pressure 134/56, pulse 72, temperature 98.7 F (37.1 C), temperature source Oral, resp. rate 16, height 5\' 6"  (1.676 m), weight 78.5 kg (173 lb 1 oz), SpO2 99.00%.  Physical Exam: General: Alert and awake, oriented CVS: S1-S2 clear Chest: CTAB Abdomen: soft nontender, nondistended, normal bowel sounds  Extremities: no cyanosis, clubbing left BKA,   Lab Results: Basic Metabolic Panel:  Recent Labs Lab 10/02/13 0730 10/04/13 0728  NA 141 133*  K 4.5 4.5  CL 97 92*  CO2 27 23  GLUCOSE 195* 135*  BUN 19 39*  CREATININE 4.45* 6.84*  CALCIUM 10.1 9.6  PHOS  --  4.0   Liver Function Tests:  Recent Labs Lab 09/29/13 0805 10/04/13 0728  ALBUMIN 3.0* 3.0*   No results found for this basename: LIPASE, AMYLASE,  in the last 168 hours No results found for this basename: AMMONIA,  in the last 168 hours CBC:  Recent Labs Lab 10/03/13 1405 10/04/13 0323  WBC 12.0* 11.2*  HGB 8.3* 8.2*  HCT 25.8* 25.1*  MCV 96.3 94.7  PLT 238 226   Cardiac Enzymes: No results found for this basename: CKTOTAL, CKMB, CKMBINDEX, TROPONINI,  in the last 168 hours BNP: No components found with this basename: POCBNP,  CBG:  Recent Labs Lab 10/04/13 1209 10/04/13 1652 10/04/13 2121 10/05/13 0729 10/05/13 1114  GLUCAP 99 217* 117* 125* 165*     Micro  Results: No results found for this or any previous visit (from the past 240 hour(s)).  Studies/Results: Ct Head Wo Contrast  09/20/2013   CLINICAL DATA:  Altered mental status ; status post cardiopulmonary resuscitation  EXAM: CT HEAD WITHOUT CONTRAST  TECHNIQUE: Contiguous axial images were obtained from the base of the skull through the vertex without intravenous contrast.  COMPARISON:  None.  FINDINGS: There is age related volume loss. There is no demonstrable mass, hemorrhage, extra-axial fluid collection, or midline shift. There is mild small vessel disease in the centra semiovale bilaterally. There is decreased attenuation in the midline of the upper to mid pons which may represent a small recent infarct in this area. No other acute appearing infarct is appreciable.  Bony calvarium appears intact.  The mastoid air cells are clear.  IMPRESSION: Age uncertain small infarct mid pons. Small infarct in this area may be recent.  There is underlying age related volume loss with mild periventricular small vessel disease. There is no  intracranial mass or hemorrhage. No gray-white compartment edema is appreciable on this study.   Electronically Signed   By: Bretta BangWilliam  Woodruff M.D.   On: 09/20/2013 12:54   Dg Chest Port 1 View  09/25/2013   CLINICAL DATA:  Edema after dialysis.  EXAM: PORTABLE CHEST - 1 VIEW  COMPARISON:  One-view chest 09/24/2013.  FINDINGS: Cardiac enlargement is stable. A left IJ line is stable in position. Interstitial edema is improved. No focal airspace consolidation is evident.  IMPRESSION: 1. Improved interstitial edema. 2. Stable cardiomegaly.   Electronically Signed   By: Gennette Pachris  Mattern M.D.   On: 09/25/2013 21:55   Dg Chest Port 1 View  09/24/2013   CLINICAL DATA:  Respiratory failure  EXAM: PORTABLE CHEST - 1 VIEW  COMPARISON:  09/23/2013  FINDINGS: Interval tracheal and esophageal extubation. Left IJ catheter in stable position, tip at the superior cavoatrial junction.  Unchanged  cardiomegaly and pulmonary edema. These changes could obscure superimposed infection. No evidence of increased pleural fluid. No pneumothorax.  IMPRESSION: 1. Stable lung volumes after tracheal extubation. 2. Unchanged pulmonary edema, which could obscure superimposed consolidation.   Electronically Signed   By: Tiburcio PeaJonathan  Watts M.D.   On: 09/24/2013 06:23   Dg Chest Port 1 View  09/23/2013   CLINICAL DATA:  Respiratory failure.  EXAM: PORTABLE CHEST - 1 VIEW  COMPARISON:  09/22/2013.  FINDINGS: Endotracheal tube, left IJ line, NG tube in stable position. Persistent cardiomegaly and pulmonary venous congestion present. Interstitial prominence present. These findings consistent with congestive heart failure. Left base atelectasis and/or infiltrate present. Left pleural effusion has improved. No pneumothorax.  IMPRESSION: 1. Stable line and tube positions. 2. Findings consistent with congestive heart failure with pulmonary interstitial edema again noted. Left pleural effusion has improved. 3. Left lower lobe atelectasis and/or infiltrate.   Electronically Signed   By: Maisie Fushomas  Register   On: 09/23/2013 07:36   Dg Chest Port 1 View  09/22/2013   CLINICAL DATA:  Respiratory failure.  EXAM: PORTABLE CHEST - 1 VIEW  COMPARISON:  Chest radiograph September 21, 2013  FINDINGS: Endotracheal tube tip projects 4.2 cm above the carina. Left internal jugular central venous catheter with distal tip at the cavoatrial junction. Nasogastric tube past the gastroesophageal junction region code though, tip is not visualized. No pneumothorax.  Stable cardiomegaly, worsening interstitial edema, with minimal patchy airspace opacity in left lung base, unchanged. Suspected small left pleural effusion. Soft tissue planes and included osseous structures are unchanged.  IMPRESSION: No apparent change in life-support lines.  Stable cardiomegaly with slightly worsening interstitial edema. Retrocardiac airspace opacity may reflect atelectasis  or confluent edema with suspected small left pleural effusion.   Electronically Signed   By: Awilda Metroourtnay  Bloomer   On: 09/22/2013 06:31   Dg Chest Port 1 View  09/21/2013   CLINICAL DATA:  Respiratory distress.  Intubated patient.  EXAM: PORTABLE CHEST - 1 VIEW  COMPARISON:  09/20/2013  FINDINGS: Endotracheal tube tip lies 3 cm above the carina. Nasogastric tube passes below the diaphragm well into the stomach. Left internal jugular central venous line tip lies the caval atrial junction.  Mild hazy airspace opacity is noted in the medial right lung base. This may reflect atelectasis. Infiltrate is possible. Lungs are otherwise clear. No pleural effusion or pneumothorax  IMPRESSION: 1. Support apparatus is stable in well positioned. 2. Mild hazy opacity in the medial right lung base, likely atelectasis. This potentially could reflect a small area of infiltrate or even asymmetric edema.  Lungs are otherwise clear. Findings are similar to the prior exam allowing for differences in patient positioning and technique.   Electronically Signed   By: Amie Portland M.D.   On: 09/21/2013 12:27   Dg Chest Port 1 View  09/20/2013   CLINICAL DATA:  Post IJ placement  EXAM: PORTABLE CHEST - 1 VIEW  COMPARISON:  Prior chest x-ray 09/20/2013 at 9:19 a.m.  FINDINGS: Interval placement of a left IJ approach central venous catheter. The tip of the catheter is in good position at the superior cavoatrial junction. No evidence of complicating pneumothorax. The patient remains intubated. The tip of the endotracheal tube is 4 cm above the carina. A nasogastric tube is present, the tip lies off the field of view presumably within the stomach. External defibrillator pads and metallic artifacts overlie the chest. Stable cardiomegaly, very low inspiratory volumes and mild pulmonary edema.  IMPRESSION: 1. New left IJ approach central venous catheter with the tip at the superior cavoatrial junction. No evidence of pneumothorax or other  complication. 2. Otherwise, no significant interval change in the appearance of the chest.   Electronically Signed   By: Malachy Moan M.D.   On: 09/20/2013 11:48   Dg Chest Portable 1 View  09/20/2013   CLINICAL DATA:  Status post intubation.  EXAM: PORTABLE CHEST - 1 VIEW  COMPARISON:  None.  FINDINGS: 0919 hrs. Endotracheal tube tip is 3.8 cm above the base of the carina. The NG tube passes into the stomach although the distal tip position is not included on the film. Lung volumes are low. The cardio pericardial silhouette is enlarged. Diffuse interstitial opacity is associated with vascular congestion. Telemetry leads overlie the chest. No evidence for pneumothorax or substantial pleural effusion.  IMPRESSION: Low volume film with enlargement of the cardiopericardial silhouette. Vascular congestion with interstitial opacities suggest associated edema.   Electronically Signed   By: Kennith Center M.D.   On: 09/20/2013 09:30    Medications: Scheduled Meds: . acetaminophen  500 mg Oral TID  . alteplase  2 mg Intracatheter Once  . aspirin  81 mg Oral Daily  . clopidogrel  75 mg Oral Q breakfast  . [START ON 10/06/2013] darbepoetin (ARANESP) injection - DIALYSIS  100 mcg Intravenous Q Wed-HD  . doxercalciferol  8 mcg Intravenous Q M,W,F-HD  . famotidine  20 mg Oral QHS  . feeding supplement (NEPRO CARB STEADY)  237 mL Oral TID BM  . heparin subcutaneous  5,000 Units Subcutaneous Q8H  . insulin aspart  0-9 Units Subcutaneous TID WC  . midodrine  5 mg Oral Q M,W,F-HD  . multivitamin  1 tablet Oral QHS  . sevelamer carbonate  1,600 mg Oral TID WC      LOS: 15 days   Savian Mazon M.D. [facility] 10/05/2013, 1:55 PM Pager: [phone]  If 7PM-7AM, please contact night-coverage [email or web address] Password [email or web address]

## 2013-10-05 NOTE — Progress Notes (Signed)
Subjective:  No complaints, walking with assistance  Objective: Vital signs in last 24 hours: Temp:  [98 F (36.7 C)-99.2 F (37.3 C)] 99.1 F (37.3 C) (01/27 16100608) Pulse Rate:  [73-80] 74 (01/27 0608) Resp:  [11-18] 16 (01/27 0608) BP: (126-155)/(52-64) 132/52 mmHg (01/27 0608) SpO2:  [98 %-100 %] 100 % (01/27 96040608) Weight change: -3.5 kg (-7 lb 11.5 oz)  Intake/Output from previous day: 01/26 0701 - 01/27 0700 In: 520 [P.O.:120; I.V.:400] Out: 2000    Lab Results:  Recent Labs  10/03/13 1405 10/04/13 0323  WBC 12.0* 11.2*  HGB 8.3* 8.2*  HCT 25.8* 25.1*  PLT 238 226   BMET:  Recent Labs  10/04/13 0728  NA 133*  K 4.5  CL 92*  CO2 23  GLUCOSE 135*  BUN 39*  CREATININE 6.84*  CALCIUM 9.6  ALBUMIN 3.0*   No results found for this basename: PTH,  in the last 72 hours Iron Studies: No results found for this basename: IRON, TIBC, TRANSFERRIN, FERRITIN,  in the last 72 hours  EXAM: General appearance:  Alert, in no apparent distress Resp:  CTA without rales, rhonchi, or wheezes Cardio:  RRR without murmur or rub GI:  + BS, soft and nontender Extremities:  L BKA, 2+ pitting edema on R Access:  AVF @ LUA with + bruit  Dialysis: MWF at [place]  4.5h (4 hr here) 79kg BFR 250 (just started buttonhole to AVF) Profile 2 F180 No heparin  Hectorol 8 Epo 20K Venofer none   Assessment/Plan: 1. Recurrent cardiorespiratory arrest - occurring on dialysis, suspected cerebral hypoperfusion due to volume shifts and known severe intracranial cerebrovascular disease. Unsafe for discharge to outpatient HD in this condition as long as there is something that may be done to improve her circulation. She has agreed to procedure by Dr Titus Dubinevashwar to try and improve cerebral blood flow. Prognosis remains guarded overall.  2. Severe ASCVD - see above, now on DAP. 3. Pulmonary edema - resolved with HD, but still with LE edema. 4. ESRD - HD on MWF @ [facility]; K 4.5; no Heparin sec to recent  GIB, not interested in PD.  Next HD tomorrow. 5. HTN/Volume - BP 132/52 on Midodrine 5 mg pre-HD, maintaining SBP > 130 during HD. 6. Anemia - Hgb 8.2 on Aranesp 100 mcg on Wed; T-sat low, but high ferritin. 7. 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 8. PAD - s/p L BKA 9. CAD - diffuse small vessel disease per cath 2011.   LOS: 15 days   Briana Gould,Briana Gould 10/05/2013,11:34 AM  I have seen and examined patient, discussed with PA and agree with assessment and plan as outlined above with additions as indicated. Vinson Moselleob Felicite Zeimet MD pager [phone]    cell [phone] 10/05/2013, 12:32 PM

## 2013-10-05 NOTE — Evaluation (Signed)
Occupational Therapy Evaluation Patient Details Name: Briana Gould MRN: [id number] DOB: 02-03-1946 Today's Date: 10/05/2013 Time: [id number] OT Time Calculation (min): 35 min  OT Assessment / Plan / Recommendation History of present illness 68 year old female w/ ESRD, [facility] d/[facility] 12/21 after being treated for UGIB while on coumadin for Right IJ thrombus stroke prevention. Was in usual state of health, getting stronger after her d/c. On 1/12 was at regular scheduled HD. About 20 minutes into HD she slumped over and became unresponsive. Initial rhythm was reported as PEA. CPR started. Reported 20 minutes CPR before ROSC, no drugs. Transferred to [facility] s/p arrest.    Clinical Impression   Pt presents with below problem list. Pt independent with ADLs, PTA. Feel pt will benefit from acute OT to increase independence prior to d/c. According to PT note, pt refusing SNF, so OT recommending HHOT with 24/7 assist.     OT Assessment  Patient needs continued OT Services    Follow Up Recommendations  Home health OT;Supervision/Assistance - 24 hour    Barriers to Discharge      Equipment Recommendations  3 in 1 bedside comode    Recommendations for Other Services    Frequency  Min 2X/week    Precautions / Restrictions Precautions Precautions: Fall Required Braces or Orthoses: Other Brace/Splint Other Brace/Splint: Lt prosthesis Restrictions Weight Bearing Restrictions: No   Pertinent Vitals/Pain No pain reported.     ADL  Eating/Feeding: Independent Where Assessed - Eating/Feeding: Edge of bed Grooming: Wash/dry face;Teeth care;Denture care;Minimal assistance;Wash/dry hands;Brushing hair (Min A for balance while standing; brushed hair sitting-setup) Where Assessed - Grooming: Supported standing;Supported sitting Upper Body Dressing: Set up Where Assessed - Upper Body Dressing: Unsupported sitting Lower Body Dressing: Minimal assistance Where Assessed - Lower Body Dressing:  Supported sit to Pharmacist, hospitalstand Toilet Transfer: Maximal assistance Toilet Transfer Method: Sit to Baristastand Toilet Transfer Equipment: Regular height toilet;Grab bars Toileting - ArchitectClothing Manipulation and Hygiene: Min guard Where Assessed - Engineer, miningToileting Clothing Manipulation and Hygiene: Standing;Sit on 3-in-1 or toilet (clothing-standing and hygiene-sitting) Tub/Shower Transfer Method: Not assessed Equipment Used: Gait belt;Rolling walker Transfers/Ambulation Related to ADLs: Min guard for ambulation ADL Comments: Educated on use of bag on walker and recommended sitting for bathing and dressing. Educated to stand in front of bed/chair with walker in front when pulling up LB clothing. Pt requiring rest break at sink during grooming.    OT Diagnosis: Generalized weakness;Other (comment) (decreased balance)  OT Problem List: Decreased strength;Decreased activity tolerance;Impaired balance (sitting and/or standing);Decreased knowledge of use of DME or AE;Decreased knowledge of precautions OT Treatment Interventions: Self-care/ADL training;Therapeutic exercise;DME and/or AE instruction;Therapeutic activities;Patient/family education;Balance training   OT Goals(Current goals can be found in the care plan section) Acute Rehab OT Goals Patient Stated Goal: get out of here OT Goal Formulation: With patient Time For Goal Achievement: 10/12/13 Potential to Achieve Goals: Good ADL Goals Pt Will Perform Grooming: with supervision;standing Pt Will Perform Upper Body Bathing: with supervision;sitting Pt Will Perform Lower Body Bathing: with supervision;sit to/from stand Pt Will Transfer to Toilet: with supervision;ambulating (3 in 1 over commode) Pt Will Perform Toileting - Clothing Manipulation and hygiene: with supervision;sit to/from stand Pt Will Perform Tub/Shower Transfer: Shower transfer;with supervision;ambulating;shower seat;rolling walker  Visit Information  Last OT Received On: 10/05/13 Assistance  Needed: +1 History of Present Illness: 68 year old female w/ ESRD, [facility] after being treated for UGIB while on coumadin for Right IJ thrombus stroke prevention. Was in  usual state of health, getting stronger after her d/c. On 1/12 was at regular scheduled HD. About 20 minutes into HD she slumped over and became unresponsive. Initial rhythm was reported as PEA. CPR started. Reported 20 minutes CPR before ROSC, no drugs. Transferred to [facility] s/p arrest.        Prior Functioning     Home Living Family/patient expects to be discharged to:: Private residence Living Arrangements: Spouse/significant other;Other relatives;Other (Comment) (pt has custody of 8, 26 and 69 yo grandchildren) Available Help at Discharge: Family (Housekeeper 8 hrs per day mon thru friday. Daughter, [name] pr) Type of Home: House Home Access: Ramped entrance Home Layout: One level Home Equipment: Walker - 2 wheels;Wheelchair - Manufacturing systems engineer - built in Additional Comments: husband is disabled and cannot provide physical (A)   Lives With: Spouse;Family;Other (Comment) (3 grandchildren she has custody of) Prior Function Level of Independence: Independent Comments: PTA pt and sister reports that pt was independent with mobility and ADLs with Lt prosthesis  Communication Communication: No difficulties Dominant Hand: Right         Vision/Perception Vision - History Baseline Vision: Wears glasses all the time   Cognition  Cognition Arousal/Alertness: Awake/alert Behavior During Therapy: WFL for tasks assessed/performed Overall Cognitive Status: Within Functional Limits for tasks assessed    Extremity/Trunk Assessment Upper Extremity Assessment Upper Extremity Assessment: Overall WFL for tasks assessed Lower Extremity Assessment Lower Extremity Assessment: Defer to PT evaluation     Mobility Transfers Overall transfer level: Needs assistance Equipment used: Rolling walker (2  wheeled) Transfers: Sit to/from Stand Sit to Stand: Min guard;Max assist;Min assist General transfer comment: Min A for stand to sit transfer to chair-cues to control descent when sitting during session. Max A for sit to stand transfer from low commode and from chair.      Exercise     Balance     End of Session OT - End of Session Equipment Utilized During Treatment: Gait belt;Rolling walker Activity Tolerance: Patient tolerated treatment well Patient left: in chair;with call bell/phone within reach Nurse Communication: Other (comment) (had BM; pt sitting in chair)  GO      Earlie Raveling OTR/L [phone] 10/05/2013, 11:33 AM

## 2013-10-05 NOTE — Progress Notes (Signed)
SPEECH-LANGUAGE PATHOLOGY   Pt was being followed by SLP for dysphagia 1/16; orders d/c'd per MD; new CIR SLP orders written 1/23.  Spoke with RN, who reports no further issues with swallowing.  Pt noted to be eating lunch without difficulty as I passed room.   If further issues develop and our services are warranted, please refer again to SLP.  Otherwise, will sign off. Dereon Williamsen L. Samson Fredericouture, [place] CCC/SLP Pager [phone]

## 2013-10-06 LAB — RENAL FUNCTION PANEL
Albumin: 2.8 g/dL — ABNORMAL LOW (ref 3.5–5.2)
BUN: 36 mg/dL — ABNORMAL HIGH (ref 6–23)
CALCIUM: 9.8 mg/dL (ref 8.4–10.5)
CO2: 26 mEq/L (ref 19–32)
Chloride: 95 mEq/L — ABNORMAL LOW (ref 96–112)
Creatinine, Ser: 6.78 mg/dL — ABNORMAL HIGH (ref 0.50–1.10)
GFR calc non Af Amer: 6 mL/min — ABNORMAL LOW (ref >90)
GFR, EST AFRICAN AMERICAN: 7 mL/min — AB (ref >90)
GLUCOSE: 202 mg/dL — AB (ref 70–99)
PHOSPHORUS: 4 mg/dL (ref 2.3–4.6)
Potassium: 4.4 mEq/L (ref 3.7–5.3)
SODIUM: 137 meq/L (ref 137–147)

## 2013-10-06 LAB — CBC
HCT: 24 % — ABNORMAL LOW (ref 36.0–46.0)
HEMOGLOBIN: 8 g/dL — AB (ref 12.0–15.0)
MCH: 31.6 pg (ref 26.0–34.0)
MCHC: 33.3 g/dL (ref 30.0–36.0)
MCV: 94.9 fL (ref 78.0–100.0)
PLATELETS: 245 10*3/uL (ref 150–400)
RBC: 2.53 MIL/uL — ABNORMAL LOW (ref 3.87–5.11)
RDW: 18 % — ABNORMAL HIGH (ref 11.5–15.5)
WBC: 8.5 10*3/uL (ref 4.0–10.5)

## 2013-10-06 LAB — PLATELET INHIBITION P2Y12: PLATELET FUNCTION P2Y12: 369 [PRU] (ref 194–418)

## 2013-10-06 LAB — GLUCOSE, CAPILLARY
GLUCOSE-CAPILLARY: 182 mg/dL — AB (ref 70–99)
GLUCOSE-CAPILLARY: 206 mg/dL — AB (ref 70–99)
Glucose-Capillary: 91 mg/dL (ref 70–99)

## 2013-10-06 MED ORDER — LIDOCAINE HCL (PF) 1 % IJ SOLN: 5.0000 mL | INTRAMUSCULAR | Status: DC | PRN

## 2013-10-06 MED ORDER — ALTEPLASE 2 MG IJ SOLR
2.0000 mg | Freq: Once | INTRAMUSCULAR | Status: DC | PRN
  Filled 2013-10-06: qty 2

## 2013-10-06 MED ORDER — DOXERCALCIFEROL 4 MCG/2ML IV SOLN
INTRAVENOUS | Status: AC
  Filled 2013-10-06: qty 4

## 2013-10-06 MED ORDER — MIDODRINE HCL 5 MG PO TABS
ORAL_TABLET | ORAL | Status: AC
  Filled 2013-10-06: qty 1

## 2013-10-06 MED ORDER — NEPRO/CARBSTEADY PO LIQD: 237.0000 mL | ORAL | Status: DC | PRN

## 2013-10-06 MED ORDER — LIDOCAINE-PRILOCAINE 2.5-2.5 % EX CREA: 1.0000 "application " | TOPICAL_CREAM | CUTANEOUS | Status: DC | PRN

## 2013-10-06 MED ORDER — PENTAFLUOROPROP-TETRAFLUOROETH EX AERO: 1.0000 "application " | INHALATION_SPRAY | CUTANEOUS | Status: DC | PRN

## 2013-10-06 MED ORDER — CLOPIDOGREL BISULFATE 75 MG PO TABS
150.0000 mg | ORAL_TABLET | Freq: Once | ORAL | Status: AC
  Administered 2013-10-06: 150 mg via ORAL
  Filled 2013-10-06: qty 2

## 2013-10-06 MED ORDER — HEPARIN SODIUM (PORCINE) 1000 UNIT/ML DIALYSIS: 1000.0000 [IU] | INTRAMUSCULAR | Status: DC | PRN

## 2013-10-06 MED ORDER — SODIUM CHLORIDE 0.9 % IV SOLN: 100.0000 mL | INTRAVENOUS | Status: DC | PRN

## 2013-10-06 MED ORDER — DARBEPOETIN ALFA-POLYSORBATE 100 MCG/0.5ML IJ SOLN
INTRAMUSCULAR | Status: AC
  Filled 2013-10-06: qty 0.5

## 2013-10-06 NOTE — Progress Notes (Signed)
Repeat P2Y12 level still 369 despite daily Plavix 75mg  Per Dr. Corliss Skainseveshwar, will give additional 150mg  Plavix today and recheck P2Y12 in am.  Brayton ElKevin Satonya Lux PA-C Interventional Radiology 10/06/2013 2:09 PM

## 2013-10-06 NOTE — Progress Notes (Signed)
[name], female   DOB: [birth year], 68 y.o.   MRN: [id number]   L internal carotid artery angioplasty/stent scheduled for 1/29 in IR with Dr Julieanne CottonSanjeev Deveshwar and anesthesia.  Consent has been signed and in chart See orders  Pt aware and agreeable

## 2013-10-06 NOTE — Progress Notes (Signed)
[facility] Progress Note   Subjective: No complaints, fully oriented  Filed Vitals:   10/05/13 1230 10/05/13 2128 10/06/13 0544 10/06/13 0900  BP: 134/56 137/54 148/62   Pulse: 72 79 81   Temp: 98.7 F (37.1 C) 99.3 F (37.4 C) 98.7 F (37.1 C)   TempSrc: Oral Oral Oral   Resp: 16  18   Height:      Weight:   85.322 kg (188 lb 1.6 oz) 78.9 kg (173 lb 15.1 oz)  SpO2: 99% 100% 100%   Exam  I weighed pt on standing scale at 81.3 kg and subtracting 2.4kg for prosthesis > standing wt = 78.9kg Alert, no distress, sitting up in bed Clear bilat, no rales or wheezes RRR no MRG Abd +BS, soft and nontender  2+ pitting edema RLE, BKA on L  LUA AV fistula patent  Dialysis: MWF [facility]  4.5h (4 hr here) 79kg BFR 250 (just started buttonhole to AVF) Profile 2 F180 No heparin  Hectorol 8 Epo 20K Venofer none  Assessment/Plan:  1. Recurrent cardiorespiratory arrest - occurring on dialysis, suspect related to poor cerebral perfusion during HD with volume and BP shifts. For procedure in IR tomorrow (L ICA angioplasty/stent) with Dr Titus Dubin 2. Severe ASCVD - see above, now on DAP. 3. HTN/volume- has vol excess w leg edema but we are not pulling hard to avoid hypotension. UF 2kg with HD today. She is at her prior dry wt but has undoubtedly lost wt in hospital. Is on midodrine pre-HD 5mg  for BP support 4. ESRD on hemodialysis, no hep with GIB in December 5. Anemia - Hgb 8.2 on Aranesp 100 mcg on Wed; T-sat low, but high ferritin. 6. 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 7. PAD - s/p L BKA 8. CAD - diffuse small vessel disease per cath 2011  Vinson Moselle MD pager [phone]    cell [phone] 10/06/2013, 10:42 AM   Recent Labs Lab 10/02/13 0730 10/04/13 0728  NA 141 133*  K 4.5 4.5  CL 97 92*  CO2 27 23  GLUCOSE 195* 135*  BUN 19 39*  CREATININE 4.45* 6.84*  CALCIUM 10.1 9.6  PHOS  --  4.0    Recent Labs Lab  10/04/13 0728  ALBUMIN 3.0*    Recent Labs Lab 10/02/13 0730 10/03/13 1405 10/04/13 0323  WBC 12.4* 12.0* 11.2*  HGB 8.5* 8.3* 8.2*  HCT 25.9* 25.8* 25.1*  MCV 94.2 96.3 94.7  PLT 222 238 226   . [START ON 10/07/2013] sodium chloride   Intravenous Once  . acetaminophen  500 mg Oral TID  . alteplase  2 mg Intracatheter Once  . aspirin  81 mg Oral Daily  . [START ON 10/07/2013]  ceFAZolin (ANCEF) IV  2 g Intravenous Once  . clopidogrel  75 mg Oral Q breakfast  . darbepoetin (ARANESP) injection - DIALYSIS  100 mcg Intravenous Q Wed-HD  . doxercalciferol  8 mcg Intravenous Q M,W,F-HD  . famotidine  20 mg Oral QHS  . feeding supplement (NEPRO CARB STEADY)  237 mL Oral TID BM  . heparin subcutaneous  5,000 Units Subcutaneous Q8H  . insulin aspart  0-9 Units Subcutaneous TID WC  . midodrine  5 mg Oral Q M,W,F-HD  . multivitamin  1 tablet Oral QHS  . [START ON 10/07/2013] niMODipine  60 mg Oral 60 min Pre-Op  . sevelamer carbonate  1,600 mg Oral TID WC   . sodium chloride  Stopped (10/04/13 1600)   sodium chloride, acetaminophen, aluminum hydroxide, bisacodyl, diphenhydrAMINE, guaiFENesin-dextromethorphan, hydrALAZINE, ondansetron (ZOFRAN) IV, prochlorperazine, prochlorperazine, prochlorperazine, RESOURCE THICKENUP CLEAR, sodium chloride, traMADol

## 2013-10-06 NOTE — Progress Notes (Signed)
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 Readmitted for PEA arrest while on dialysis. Patient cardiac arrest etiology is not fully determined, question carotid stenosis, severe pulm HTN or severe CAD may be contributing when she is becoming hypotensivie during dialysis sessions, cardiac arrytmia. Plan is for intracranial angiogram and or possible stent 1-29 to see if that help with circulation. Not safe to discharge patient to outpatient dialysis. Appreciate Dr Arlean Hopping help.   Assessment/Plan:   -A fib: RVR with subsequent cardiac pause 6.8 sec on 1-23 after dialysis. Patient Blood pressure decrease during dialysis 1-23. HR increased. She received IV bolus. Cardiology consulted. She was on coumadin at some point but this was stop due to bleed. Difficult situation with anticoagulation. Patient converted spontaneously. She had a pause of 6.8 sec. She didn't received any medications.  No good candidate for pacer per cardio evaluation. No further evaluation per cardio.  -now sinus rhythm.  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  - Multiple times - etiology not fully determined, but suspect related to cerebral hypoperfusion during HD with volume and BP shifts. -No further evaluation per cardio.   Carotid artery stenosis - 1-39% stenosis involving the left internal carotid artery. - Findings consistent with 60-79% right ICA stenosis  - interventional radiology has been consulted, cerebral angiogram done  by Dr. Corliss Skains.  -Severe stenosis of LT ICA pet cavernous junction of  95% , 60 % stenosis of RT ICA prox.. 50 % steosis of LT ICA petrous seg. Occluded RT VBJ -Plan for intracranial angiogram and or possible stent 1-29.agree with plavix and aspirin prior to procedure. Monitor CBC.  Patient needs to be on plavix and aspirin for procedure for at least 5 days.  -Day 4 plavix, aspirin.   -Leukocytosis; Monitor, mildly increased. Will remove central line today.   -Chest pain: resolved.  -Musculoskeletal likely due to CPR with chest wall tenderness improving:  - Placed her on tramadol as needed and Tylenol 500mg  TID.   -ESRD (end stage renal disease)  - per nephrology.   -Acute Respiratory failure with hypoxia/ Pulmonary edema/ diastolic CHF  - s/p arrest  - nephrology team to manage fluid balance    Severe Pulmonary HTN with moderate TR, CAD  - cath in 2011 revealed diffuse distal disease   Right IJ thrombus  -found on a previous admission- not a candidate for anticoagulation due to PUD with bleed in 12/14 in setting of Coumadin   PUD - cont Pepcid   Hypertension  - Hydralazine  PRN. Careful with Hypotension.   Encephalopathy acute - likely due to anoxia secondary to cardiac arrest- may have new cognitive baseline-  - Levaquin started on 1/16- continue for 5 days . Finished course.  -Resolved.   Severe PAD with left BKA.   DVT Prophylaxis: SCD, patient on plavix, aspirin. High risk for bleeding.   Code Status: Full code  Family Communication: Care discussed with patient.   Disposition: Transfer to telemetry. CIR consult  SIGNIFICANT EVENTS / STUDIES:  1/12 cardiac arrest/ PEA at HD.  20 minutes estimated to ROSC  1/12 Echocardiogram: LVEF 50-55%. No RWMAs. Moderate LVH. Probably mild AS. Mild MR. RVSP est 69 mmHg  1/12 CT head: Age uncertain small infarct mid pons. Small infarct in this area may be recent.  1/13 Cognition much improved. Failed SBT  1/15 Extubated successfully  1/20: Cerebral angiogram done   Subjective:  No chest pain or  dyspnea. No abdominal pain. For HD today.  Objective: Weight change: 3.721 kg (8 lb 3.3 oz)  Intake/Output Summary (Last 24 hours) at 10/06/13 1055 Last data filed at 10/06/13 0900  Gross per 24 hour  Intake    920 ml  Output      0 ml  Net    920 ml   Blood pressure 148/62, pulse 81, temperature 98.7 F (37.1 C), temperature source Oral, resp. rate 18, height 5\' 6"  (1.676 m), weight 78.9 kg (173 lb 15.1 oz), SpO2 100.00%.  Physical Exam: General: Alert and awake, oriented. CVS: S1-S2 clear Chest: CTAB Abdomen: soft nontender, nondistended, normal bowel sounds  Extremities: no cyanosis, clubbing left BKA,   Lab Results: Basic Metabolic Panel:  Recent Labs Lab 10/02/13 0730 10/04/13 0728  NA 141 133*  K 4.5 4.5  CL 97 92*  CO2 27 23  GLUCOSE 195* 135*  BUN 19 39*  CREATININE 4.45* 6.84*  CALCIUM 10.1 9.6  PHOS  --  4.0   Liver Function Tests:  Recent Labs Lab 10/04/13 0728  ALBUMIN 3.0*   No results found for this basename: LIPASE, AMYLASE,  in the last 168 hours No results found for this basename: AMMONIA,  in the last 168 hours CBC:  Recent Labs Lab 10/03/13 1405 10/04/13 0323  WBC 12.0* 11.2*  HGB 8.3* 8.2*  HCT 25.8* 25.1*  MCV 96.3 94.7  PLT 238 226   Cardiac Enzymes: No results found for this basename: CKTOTAL, CKMB, CKMBINDEX, TROPONINI,  in the last 168 hours BNP: No components found with this basename: POCBNP,  CBG:  Recent Labs Lab 10/05/13 0729 10/05/13 1114 10/05/13 1640 10/05/13 2130 10/06/13 0744  GLUCAP 125* 165* 155* 192* 182*     Micro Results: No results found for this or any previous visit (from the past 240 hour(s)).  Studies/Results: Ct Head Wo Contrast  09/20/2013   CLINICAL DATA:  Altered mental status ; status post cardiopulmonary resuscitation  EXAM: CT HEAD WITHOUT CONTRAST  TECHNIQUE: Contiguous axial images were obtained from the base of the skull through the vertex without intravenous contrast.  COMPARISON:   None.  FINDINGS: There is age related volume loss. There is no demonstrable mass, hemorrhage, extra-axial fluid collection, or midline shift. There is mild small vessel disease in the centra semiovale bilaterally. There is decreased attenuation in the midline of the upper to mid pons which may represent a small recent infarct in this area. No other acute appearing infarct is appreciable.  Bony calvarium appears intact.  The mastoid air cells are clear.  IMPRESSION: Age uncertain small infarct mid pons. Small infarct in this area may be recent.  There is underlying age related volume loss with mild periventricular small vessel disease. There is no intracranial mass or hemorrhage. No gray-white compartment edema is appreciable on this study.   Electronically Signed   By: Bretta Bang M.D.   On: 09/20/2013 12:54   Dg Chest Port 1 View  09/25/2013   CLINICAL DATA:  Edema after dialysis.  EXAM: PORTABLE CHEST - 1 VIEW  COMPARISON:  One-view chest 09/24/2013.  FINDINGS: Cardiac  enlargement is stable. A left IJ line is stable in position. Interstitial edema is improved. No focal airspace consolidation is evident.  IMPRESSION: 1. Improved interstitial edema. 2. Stable cardiomegaly.   Electronically Signed   By: Gennette Pac M.D.   On: 09/25/2013 21:55   Dg Chest Port 1 View  09/24/2013   CLINICAL DATA:  Respiratory failure  EXAM: PORTABLE CHEST - 1 VIEW  COMPARISON:  09/23/2013  FINDINGS: Interval tracheal and esophageal extubation. Left IJ catheter in stable position, tip at the superior cavoatrial junction.  Unchanged cardiomegaly and pulmonary edema. These changes could obscure superimposed infection. No evidence of increased pleural fluid. No pneumothorax.  IMPRESSION: 1. Stable lung volumes after tracheal extubation. 2. Unchanged pulmonary edema, which could obscure superimposed consolidation.   Electronically Signed   By: Tiburcio Pea M.D.   On: 09/24/2013 06:23   Dg Chest Port 1 View  09/23/2013    CLINICAL DATA:  Respiratory failure.  EXAM: PORTABLE CHEST - 1 VIEW  COMPARISON:  09/22/2013.  FINDINGS: Endotracheal tube, left IJ line, NG tube in stable position. Persistent cardiomegaly and pulmonary venous congestion present. Interstitial prominence present. These findings consistent with congestive heart failure. Left base atelectasis and/or infiltrate present. Left pleural effusion has improved. No pneumothorax.  IMPRESSION: 1. Stable line and tube positions. 2. Findings consistent with congestive heart failure with pulmonary interstitial edema again noted. Left pleural effusion has improved. 3. Left lower lobe atelectasis and/or infiltrate.   Electronically Signed   By: Maisie Fus  Register   On: 09/23/2013 07:36   Dg Chest Port 1 View  09/22/2013   CLINICAL DATA:  Respiratory failure.  EXAM: PORTABLE CHEST - 1 VIEW  COMPARISON:  Chest radiograph September 21, 2013  FINDINGS: Endotracheal tube tip projects 4.2 cm above the carina. Left internal jugular central venous catheter with distal tip at the cavoatrial junction. Nasogastric tube past the gastroesophageal junction region code though, tip is not visualized. No pneumothorax.  Stable cardiomegaly, worsening interstitial edema, with minimal patchy airspace opacity in left lung base, unchanged. Suspected small left pleural effusion. Soft tissue planes and included osseous structures are unchanged.  IMPRESSION: No apparent change in life-support lines.  Stable cardiomegaly with slightly worsening interstitial edema. Retrocardiac airspace opacity may reflect atelectasis or confluent edema with suspected small left pleural effusion.   Electronically Signed   By: Awilda Metro   On: 09/22/2013 06:31   Dg Chest Port 1 View  09/21/2013   CLINICAL DATA:  Respiratory distress.  Intubated patient.  EXAM: PORTABLE CHEST - 1 VIEW  COMPARISON:  09/20/2013  FINDINGS: Endotracheal tube tip lies 3 cm above the carina. Nasogastric tube passes below the diaphragm well  into the stomach. Left internal jugular central venous line tip lies the caval atrial junction.  Mild hazy airspace opacity is noted in the medial right lung base. This may reflect atelectasis. Infiltrate is possible. Lungs are otherwise clear. No pleural effusion or pneumothorax  IMPRESSION: 1. Support apparatus is stable in well positioned. 2. Mild hazy opacity in the medial right lung base, likely atelectasis. This potentially could reflect a small area of infiltrate or even asymmetric edema. Lungs are otherwise clear. Findings are similar to the prior exam allowing for differences in patient positioning and technique.   Electronically Signed   By: Amie Portland M.D.   On: 09/21/2013 12:27   Dg Chest Port 1 View  09/20/2013   CLINICAL DATA:  Post IJ placement  EXAM: PORTABLE CHEST - 1 VIEW  COMPARISON:  Prior chest x-ray 09/20/2013 at 9:19 a.m.  FINDINGS: Interval placement of a left IJ approach central venous catheter. The tip of the catheter is in good position at the superior cavoatrial junction. No evidence of complicating pneumothorax. The patient remains intubated. The tip of the endotracheal tube is 4 cm above the carina. A nasogastric tube is present, the tip lies off the field of view presumably within the stomach. External defibrillator pads and metallic artifacts overlie the chest. Stable cardiomegaly, very low inspiratory volumes and mild pulmonary edema.  IMPRESSION: 1. New left IJ approach central venous catheter with the tip at the superior cavoatrial junction. No evidence of pneumothorax or other complication. 2. Otherwise, no significant interval change in the appearance of the chest.   Electronically Signed   By: Malachy MoanHeath  McCullough M.D.   On: 09/20/2013 11:48   Dg Chest Portable 1 View  09/20/2013   CLINICAL DATA:  Status post intubation.  EXAM: PORTABLE CHEST - 1 VIEW  COMPARISON:  None.  FINDINGS: 0919 hrs. Endotracheal tube tip is 3.8 cm above the base of the carina. The NG tube passes  into the stomach although the distal tip position is not included on the film. Lung volumes are low. The cardio pericardial silhouette is enlarged. Diffuse interstitial opacity is associated with vascular congestion. Telemetry leads overlie the chest. No evidence for pneumothorax or substantial pleural effusion.  IMPRESSION: Low volume film with enlargement of the cardiopericardial silhouette. Vascular congestion with interstitial opacities suggest associated edema.   Electronically Signed   By: Kennith CenterEric  Mansell M.D.   On: 09/20/2013 09:30    Medications: Scheduled Meds: . [START ON 10/07/2013] sodium chloride   Intravenous Once  . acetaminophen  500 mg Oral TID  . alteplase  2 mg Intracatheter Once  . aspirin  81 mg Oral Daily  . [START ON 10/07/2013]  ceFAZolin (ANCEF) IV  2 g Intravenous Once  . clopidogrel  75 mg Oral Q breakfast  . darbepoetin (ARANESP) injection - DIALYSIS  100 mcg Intravenous Q Wed-HD  . doxercalciferol  8 mcg Intravenous Q M,W,F-HD  . famotidine  20 mg Oral QHS  . feeding supplement (NEPRO CARB STEADY)  237 mL Oral TID BM  . heparin subcutaneous  5,000 Units Subcutaneous Q8H  . insulin aspart  0-9 Units Subcutaneous TID WC  . midodrine  5 mg Oral Q M,W,F-HD  . multivitamin  1 tablet Oral QHS  . [START ON 10/07/2013] niMODipine  60 mg Oral 60 min Pre-Op  . sevelamer carbonate  1,600 mg Oral TID WC   Time Spent: 25 minutes   LOS: 16 days   Chaya JanHERNANDEZ ACOSTA,Reeve Mallo M.D. [facility] 10/06/2013, 10:55 AM Pager: [phone]  If 7PM-7AM, please contact night-coverage [email or web address] Password [email or web address]

## 2013-10-06 NOTE — Progress Notes (Signed)
PT Cancellation Note  Patient Details Name: Briana Gould MRN: [id number] DOB: 1946-01-25   Cancelled Treatment:    Reason Eval/Treat Not Completed: Patient at procedure or test/unavailable.  PT to check back later today as time allows.     Rollene Rotundaebecca B. Janat Tabbert, PT, DPT [phone]   10/06/2013, 10:50 AM

## 2013-10-07 ENCOUNTER — Other Ambulatory Visit (HOSPITAL_COMMUNITY): Payer: Medicare Other

## 2013-10-07 ENCOUNTER — Encounter (HOSPITAL_COMMUNITY)
Admission: EM | Disposition: A | Discharge status: Rehabilitation Facility | Payer: Self-pay | Source: Home / Self Care | Attending: Internal Medicine

## 2013-10-07 ENCOUNTER — Encounter (HOSPITAL_COMMUNITY): Payer: Self-pay | Admitting: Anesthesiology

## 2013-10-07 ENCOUNTER — Inpatient Hospital Stay (HOSPITAL_COMMUNITY): Payer: Medicare Other | Admitting: Anesthesiology

## 2013-10-07 ENCOUNTER — Encounter (HOSPITAL_COMMUNITY): Payer: Medicare Other | Admitting: Anesthesiology

## 2013-10-07 LAB — COMPREHENSIVE METABOLIC PANEL
ALBUMIN: 2.7 g/dL — AB (ref 3.5–5.2)
ALT: 11 U/L (ref 0–35)
AST: 19 U/L (ref 0–37)
Alkaline Phosphatase: 91 U/L (ref 39–117)
BUN: 19 mg/dL (ref 6–23)
CALCIUM: 9.5 mg/dL (ref 8.4–10.5)
CO2: 27 mEq/L (ref 19–32)
CREATININE: 3.91 mg/dL — AB (ref 0.50–1.10)
Chloride: 95 mEq/L — ABNORMAL LOW (ref 96–112)
GFR calc Af Amer: 13 mL/min — ABNORMAL LOW (ref >90)
GFR calc non Af Amer: 11 mL/min — ABNORMAL LOW (ref >90)
Glucose, Bld: 192 mg/dL — ABNORMAL HIGH (ref 70–99)
Potassium: 3.7 mEq/L (ref 3.7–5.3)
SODIUM: 137 meq/L (ref 137–147)
Total Bilirubin: 0.3 mg/dL (ref 0.3–1.2)
Total Protein: 6.5 g/dL (ref 6.0–8.3)

## 2013-10-07 LAB — CBC WITH DIFFERENTIAL/PLATELET
BASOS PCT: 1 % (ref 0–1)
Basophils Absolute: 0.1 10*3/uL (ref 0.0–0.1)
Eosinophils Absolute: 0.3 10*3/uL (ref 0.0–0.7)
Eosinophils Relative: 4 % (ref 0–5)
HEMATOCRIT: 25.7 % — AB (ref 36.0–46.0)
HEMOGLOBIN: 8.4 g/dL — AB (ref 12.0–15.0)
LYMPHS PCT: 16 % (ref 12–46)
Lymphs Abs: 1.4 10*3/uL (ref 0.7–4.0)
MCH: 31.5 pg (ref 26.0–34.0)
MCHC: 32.7 g/dL (ref 30.0–36.0)
MCV: 96.3 fL (ref 78.0–100.0)
MONO ABS: 0.6 10*3/uL (ref 0.1–1.0)
MONOS PCT: 7 % (ref 3–12)
NEUTROS ABS: 6.3 10*3/uL (ref 1.7–7.7)
NEUTROS PCT: 73 % (ref 43–77)
Platelets: 243 10*3/uL (ref 150–400)
RBC: 2.67 MIL/uL — ABNORMAL LOW (ref 3.87–5.11)
RDW: 18.3 % — ABNORMAL HIGH (ref 11.5–15.5)
WBC: 8.6 10*3/uL (ref 4.0–10.5)

## 2013-10-07 LAB — GLUCOSE, CAPILLARY
GLUCOSE-CAPILLARY: 139 mg/dL — AB (ref 70–99)
GLUCOSE-CAPILLARY: 241 mg/dL — AB (ref 70–99)
Glucose-Capillary: 121 mg/dL — ABNORMAL HIGH (ref 70–99)
Glucose-Capillary: 180 mg/dL — ABNORMAL HIGH (ref 70–99)

## 2013-10-07 LAB — APTT: APTT: 32 s (ref 24–37)

## 2013-10-07 LAB — PLATELET INHIBITION P2Y12: Platelet Function  P2Y12: 351 [PRU] (ref 194–418)

## 2013-10-07 LAB — PROTIME-INR
INR: 1.04 (ref 0.00–1.49)
Prothrombin Time: 13.4 seconds (ref 11.6–15.2)

## 2013-10-07 SURGERY — RADIOLOGY WITH ANESTHESIA
Anesthesia: General

## 2013-10-07 SURGERY — Surgical Case
Anesthesia: *Unknown

## 2013-10-07 MED ORDER — TICAGRELOR 90 MG PO TABS
90.0000 mg | ORAL_TABLET | Freq: Two times a day (BID) | ORAL | Status: DC
  Filled 2013-10-07: qty 1

## 2013-10-07 MED ORDER — TICAGRELOR 90 MG PO TABS
180.0000 mg | ORAL_TABLET | Freq: Once | ORAL | Status: AC
  Administered 2013-10-07: 180 mg via ORAL
  Filled 2013-10-07: qty 2

## 2013-10-07 MED ORDER — NEPRO/CARBSTEADY PO LIQD
237.0000 mL | Freq: Every day | ORAL | Status: DC
  Administered 2013-10-07 – 2013-10-14 (×4): 237 mL via ORAL

## 2013-10-07 NOTE — Progress Notes (Addendum)
Occupational Therapy Treatment Patient Details Name: Briana Gould MRN: [id number] DOB: 08-25-1946 Today's Date: 10/07/2013 Time: [id number] OT Time Calculation (min): 25 min  OT Assessment / Plan / Recommendation  History of present illness 68 year old female w/ ESRD, [facility] after being treated for UGIB while on coumadin for Right IJ thrombus stroke prevention. Was in usual state of health, getting stronger after her d/c. On 1/12 was at regular scheduled HD. About 20 minutes into HD she slumped over and became unresponsive. Initial rhythm was reported as PEA. CPR started. Reported 20 minutes CPR before ROSC, no drugs. Transferred to [facility] s/p arrest. Of note, she has PMHx of left BKA and wears a prosthesis.     OT comments  Pt very deconditioned and at high risk of falls. ADL are difficult for pt due to decreased strength and endurance. Feel pt is excellent rehab candidate to facilitate return to PLOF. Pt will need to be close to mod I level prior to D/C home. .Pt will benefit from skilled OT services to facilitate D/C to CIR due to below deficits.   Follow Up Recommendations  CIR;Supervision/Assistance - 24 hour    Barriers to Discharge       Equipment Recommendations  3 in 1 bedside comode    Recommendations for Other Services Rehab consult  Frequency Min 2X/week   Progress towards OT Goals Progress towards OT goals: Progressing toward goals  Plan Discharge plan needs to be updated    Precautions / Restrictions Precautions Precautions: Fall Required Braces or Orthoses: Other Brace/Splint Other Brace/Splint: LLE prosthesis   Pertinent Vitals/Pain no apparent distress     ADL  Upper Body Bathing: Set up;Supervision/safety Where Assessed - Upper Body Bathing: Unsupported sitting Lower Body Bathing: Minimal assistance Where Assessed - Lower Body Bathing: Supported sit to stand Upper Body Dressing: Set up Lower Body Dressing: Moderate assistance Where  Assessed - Lower Body Dressing: Supported sit to Pharmacist, hospitalstand Toilet Transfer: Moderate assistance (from lower commode) Toilet Transfer Method: Sit to stand;Stand pivot Toileting - Clothing Manipulation and Hygiene: Supervision/safety Where Assessed - Toileting Clothing Manipulation and Hygiene: Lean right and/or left Equipment Used: Gait belt;Rolling walker;Other (comment) (prosthetic) Transfers/Ambulation Related to ADLs: mod A for transfer from lower chair. mi a with ambulation ADL Comments: Poor endurance for ADL    OT Diagnosis:    OT Problem List:   OT Treatment Interventions:     OT Goals(current goals can now be found in the care plan section) Acute Rehab OT Goals Patient Stated Goal: get out of here OT Goal Formulation: With patient Time For Goal Achievement: 10/12/13 Potential to Achieve Goals: Good ADL Goals Pt Will Perform Grooming: with supervision;standing Pt Will Perform Upper Body Bathing: with supervision;sitting Pt Will Perform Lower Body Bathing: with supervision;sit to/from stand Pt Will Transfer to Toilet: with supervision;ambulating Pt Will Perform Toileting - Clothing Manipulation and hygiene: with supervision;sit to/from stand Pt Will Perform Tub/Shower Transfer: Shower transfer;with supervision;ambulating;shower seat;rolling walker  Visit Information  Last OT Received On: 10/07/13 Assistance Needed: +1 History of Present Illness: 68 year old female w/ ESRD, [facility] after being treated for UGIB while on coumadin for Right IJ thrombus stroke prevention. Was in usual state of health, getting stronger after her d/c. On 1/12 was at regular scheduled HD. About 20 minutes into HD she slumped over and became unresponsive. Initial rhythm was reported as PEA. CPR started. Reported 20 minutes CPR before ROSC, no drugs. Transferred to  [facility] s/p arrest. Of note, she has PMHx of left BKA and wears a prosthesis.      Subjective Data      Prior  Functioning    mod I   Cognition  Cognition Arousal/Alertness: Awake/alert Behavior During Therapy: WFL for tasks assessed/performed Overall Cognitive Status: Within Functional Limits for tasks assessed    Mobility  Transfers Overall transfer level: Needs assistance Equipment used: Rolling walker (2 wheeled) Transfers: Sit to/from Stand Sit to Stand: Mod assist (from lower surface) General transfer comment: mod A to control descent in to recliner. Pt "flopping into chair" because of fatigue    Exercises  General Exercises - Upper Extremity Shoulder Flexion: AROM;Both;10 reps;Seated Elbow Flexion: AROM;Both;10 reps;Seated Other Exercises Other Exercises: given squeeze ball Other Exercises: educated on general BUE/LE AROM to do while OOB   Balance Balance Sitting-balance support: No upper extremity supported;Feet supported Sitting balance-Leahy Scale: Good Standing balance support: Bilateral upper extremity supported;During functional activity Standing balance-Leahy Scale: Fair  End of Session OT - End of Session Equipment Utilized During Treatment: Gait belt;Rolling walker;Other (comment) (L prosthetic leg) Activity Tolerance: Patient tolerated treatment well Patient left: in chair;with call bell/phone within reach Nurse Communication: Mobility status  GO     Daziyah Cogan,HILLARY 10/07/2013, 4:01 PM [facility], OTR/L  [phone] 10/07/2013

## 2013-10-07 NOTE — Anesthesia Preprocedure Evaluation (Addendum)
Anesthesia Evaluation  Patient identified by MRN, date of birth, ID band Patient awake    Reviewed: Allergy & Precautions, H&P , NPO status , Patient's Chart, lab work & pertinent test results  Airway       Dental   Pulmonary          Cardiovascular hypertension, + CAD and + Peripheral Vascular Disease + dysrhythmias   ECHO 09-20-13 Systolic function was normal. The estimated ejection   fraction was in the range of 50% to 55%. - Aortic valve: No good CW doppler may have mild AS but   certainly not severe. - Mitral valve: Moderately calcified, moderately thickened   annulus. Mild regurgitation  01-Oct-2013 15:52:28 [facility]  Unusual P axis, possible ectopic atrial rhythm or junctional rhythm Left axis deviation Left bundle branch block Abnormal ECG Compared to previous tracing - a-fib has resolved and there is less lateral ST depression   Neuro/Psych PSYCHIATRIC DISORDERS    GI/Hepatic   Endo/Other  diabetes, Type 2  Renal/GU ESRFRenal disease     Musculoskeletal   Abdominal   Peds  Hematology   Anesthesia Other Findings   Reproductive/Obstetrics                        Anesthesia Physical Anesthesia Plan  ASA: III  Anesthesia Plan: General and MAC   Post-op Pain Management:    Induction: Intravenous  Airway Management Planned: Oral ETT  Additional Equipment: Arterial line  Intra-op Plan:   Post-operative Plan: Extubation in OR  Informed Consent: I have reviewed the patients History and Physical, chart, labs and discussed the procedure including the risks, benefits and alternatives for the proposed anesthesia with the patient or authorized representative who has indicated his/her understanding and acceptance.     Plan Discussed with:   Anesthesia Plan Comments:         Anesthesia Quick Evaluation

## 2013-10-07 NOTE — Progress Notes (Signed)
[name]  female  ZOX:[id number]    DOB: 1946/01/31    DOA: 09/20/2013  PCP: Laurena SlimmerLARK,PRESTON S, MD   Brief narrative:  68 y/o female with PMH of DM type 2, CAD, severe cerebrovascular disease, right IJ thrombus PAD with b/l BKA, severe carotid disease, Pulm HTN, ESRD on HD and cardiac arrest while on HD who was discharged on 12/21 after being treated at [facility] for an upper GI bleed in setting of Coumadin use (IJ thrombus). Readmitted for PEA arrest while on dialysis. Patient cardiac arrest etiology is not fully determined, question carotid stenosis, severe pulm HTN or severe CAD may be contributing when she is becoming hypotensivie during dialysis sessions, cardiac arrytmia. Plan is for intracranial angiogram and or possible stent 1-29 to see if that help with circulation. Not safe to discharge patient to outpatient dialysis. Appreciate Dr Arlean HoppingSchertz help.   Assessment/Plan:   -A fib: RVR with subsequent cardiac pause 6.8 sec on 1-23 after dialysis. Patient Blood pressure decrease during dialysis 1-23. HR increased. She received IV bolus. Cardiology consulted. She was on coumadin at some point but this was stop due to bleed. Difficult situation with anticoagulation. Patient converted spontaneously. She had a pause of 6.8 sec. She didn't received any medications.  No good candidate for pacer per cardio evaluation. No further evaluation per cardio.  -now sinus rhythm.  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  - Multiple times - etiology not fully determined, but suspect related to cerebral hypoperfusion during HD with volume and BP shifts. -No further evaluation per cardio.   Carotid artery stenosis - 1-39% stenosis involving the left internal carotid artery. - Findings consistent with 60-79% right ICA stenosis  - interventional radiology has been consulted, cerebral angiogram done  by Dr. Corliss Skainseveshwar.  -Severe stenosis of LT ICA pet cavernous junction of  95% , 60 % stenosis of RT ICA prox.. 50 % steosis of LT ICA petrous seg. Occluded RT VBJ -Plan for intracranial angiogram and or possible stent 1-29. -Per Dr. Corliss Skainseveshwar: resistant to plavix. Switch to Brilinta and reschedule angiogram for early next week.  -Leukocytosis; Resolved  -Chest pain: resolved.  -Musculoskeletal likely due to CPR with chest wall tenderness improving:  - Placed her on tramadol as needed and Tylenol 500mg  TID.   -ESRD (end stage renal disease)  - per nephrology.   -Acute Respiratory failure with hypoxia/ Pulmonary edema/ diastolic CHF  - s/p arrest  - nephrology team to manage fluid balance    Severe Pulmonary HTN with moderate TR, CAD  - cath in 2011 revealed diffuse distal disease   Right IJ thrombus  -found on a previous admission- not a candidate for anticoagulation due to PUD with bleed in 12/14 in setting of Coumadin   PUD - cont Pepcid   Hypertension  - Hydralazine  PRN. Careful with Hypotension.   Encephalopathy acute - likely due to anoxia secondary to cardiac arrest- may have new cognitive baseline-  - Levaquin started on 1/16- continue for 5 days . Finished course.  -Resolved.   Severe PAD with left BKA.   DVT Prophylaxis: SCD, patient on plavix, aspirin. High risk for bleeding.   Code Status: Full code  Family Communication: Care discussed with patient.   Disposition: Transfer to telemetry. CIR consult  SIGNIFICANT EVENTS / STUDIES:  1/12 cardiac arrest/ PEA at HD. 20 minutes estimated to ROSC  1/12 Echocardiogram: LVEF 50-55%. No RWMAs. Moderate LVH. Probably mild AS. Mild MR. RVSP est 69 mmHg  1/12 CT head: Age uncertain small infarct mid pons. Small infarct in this area may be recent.  1/13 Cognition much improved. Failed SBT  1/15 Extubated successfully  1/20: Cerebral angiogram done   Subjective:  No chest pain or dyspnea. No abdominal pain.   Objective: Weight change: -6.422 kg (-14 lb 2.5 oz)  Intake/Output Summary  (Last 24 hours) at 10/07/13 1706 Last data filed at 10/06/13 1950  Gross per 24 hour  Intake    237 ml  Output      0 ml  Net    237 ml   Blood pressure 154/68, pulse 78, temperature 98.5 F (36.9 C), temperature source Oral, resp. rate 18, height 5\' 6"  (1.676 m), weight 83 kg (182 lb 15.7 oz), SpO2 100.00%.  Physical Exam: General: Alert and awake, oriented. CVS: S1-S2 clear Chest: CTAB Abdomen: soft nontender, nondistended, normal bowel sounds  Extremities: no cyanosis, clubbing left BKA,   Lab Results: Basic Metabolic Panel:  Recent Labs Lab 10/06/13 1030 10/07/13 0007  NA 137 137  K 4.4 3.7  CL 95* 95*  CO2 26 27  GLUCOSE 202* 192*  BUN 36* 19  CREATININE 6.78* 3.91*  CALCIUM 9.8 9.5  PHOS 4.0  --    Liver Function Tests:  Recent Labs Lab 10/06/13 1030 10/07/13 0007  AST  --  19  ALT  --  11  ALKPHOS  --  91  BILITOT  --  0.3  PROT  --  6.5  ALBUMIN 2.8* 2.7*   No results found for this basename: LIPASE, AMYLASE,  in the last 168 hours No results found for this basename: AMMONIA,  in the last 168 hours CBC:  Recent Labs Lab 10/06/13 1030 10/07/13 0007  WBC 8.5 8.6  NEUTROABS  --  6.3  HGB 8.0* 8.4*  HCT 24.0* 25.7*  MCV 94.9 96.3  PLT 245 243   Cardiac Enzymes: No results found for this basename: CKTOTAL, CKMB, CKMBINDEX, TROPONINI,  in the last 168 hours BNP: No components found with this basename: POCBNP,  CBG:  Recent Labs Lab 10/06/13 1603 10/06/13 2103 10/07/13 0743 10/07/13 1112 10/07/13 1621  GLUCAP 91 206* 139* 241* 121*     Micro Results: No results found for this or any previous visit (from the past 240 hour(s)).  Studies/Results: Ct Head Wo Contrast  09/20/2013   CLINICAL DATA:  Altered mental status ; status post cardiopulmonary resuscitation  EXAM: CT HEAD WITHOUT CONTRAST  TECHNIQUE: Contiguous axial images were obtained from the base of the skull through the vertex without intravenous contrast.  COMPARISON:  None.   FINDINGS: There is age related volume loss. There is no demonstrable mass, hemorrhage, extra-axial fluid collection, or midline shift. There is mild small vessel disease in the centra semiovale bilaterally. There is decreased attenuation in the midline of the upper to mid pons which may represent a small recent infarct in this area. No other acute appearing infarct is appreciable.  Bony calvarium appears intact.  The mastoid air cells are clear.  IMPRESSION: Age uncertain small infarct mid pons. Small infarct in this area may be recent.  There is underlying age related volume loss with mild periventricular small vessel disease. There is no intracranial mass or hemorrhage. No gray-white compartment edema is appreciable on this study.   Electronically Signed   By: Bretta Bang M.D.   On: 09/20/2013 12:54   Dg Chest Port 1 View  09/25/2013   CLINICAL DATA:  Edema after dialysis.  EXAM: PORTABLE  CHEST - 1 VIEW  COMPARISON:  One-view chest 09/24/2013.  FINDINGS: Cardiac enlargement is stable. A left IJ line is stable in position. Interstitial edema is improved. No focal airspace consolidation is evident.  IMPRESSION: 1. Improved interstitial edema. 2. Stable cardiomegaly.   Electronically Signed   By: Gennette Pac M.D.   On: 09/25/2013 21:55   Dg Chest Port 1 View  09/24/2013   CLINICAL DATA:  Respiratory failure  EXAM: PORTABLE CHEST - 1 VIEW  COMPARISON:  09/23/2013  FINDINGS: Interval tracheal and esophageal extubation. Left IJ catheter in stable position, tip at the superior cavoatrial junction.  Unchanged cardiomegaly and pulmonary edema. These changes could obscure superimposed infection. No evidence of increased pleural fluid. No pneumothorax.  IMPRESSION: 1. Stable lung volumes after tracheal extubation. 2. Unchanged pulmonary edema, which could obscure superimposed consolidation.   Electronically Signed   By: Tiburcio Pea M.D.   On: 09/24/2013 06:23   Dg Chest Port 1 View  09/23/2013    CLINICAL DATA:  Respiratory failure.  EXAM: PORTABLE CHEST - 1 VIEW  COMPARISON:  09/22/2013.  FINDINGS: Endotracheal tube, left IJ line, NG tube in stable position. Persistent cardiomegaly and pulmonary venous congestion present. Interstitial prominence present. These findings consistent with congestive heart failure. Left base atelectasis and/or infiltrate present. Left pleural effusion has improved. No pneumothorax.  IMPRESSION: 1. Stable line and tube positions. 2. Findings consistent with congestive heart failure with pulmonary interstitial edema again noted. Left pleural effusion has improved. 3. Left lower lobe atelectasis and/or infiltrate.   Electronically Signed   By: Maisie Fus  Register   On: 09/23/2013 07:36   Dg Chest Port 1 View  09/22/2013   CLINICAL DATA:  Respiratory failure.  EXAM: PORTABLE CHEST - 1 VIEW  COMPARISON:  Chest radiograph September 21, 2013  FINDINGS: Endotracheal tube tip projects 4.2 cm above the carina. Left internal jugular central venous catheter with distal tip at the cavoatrial junction. Nasogastric tube past the gastroesophageal junction region code though, tip is not visualized. No pneumothorax.  Stable cardiomegaly, worsening interstitial edema, with minimal patchy airspace opacity in left lung base, unchanged. Suspected small left pleural effusion. Soft tissue planes and included osseous structures are unchanged.  IMPRESSION: No apparent change in life-support lines.  Stable cardiomegaly with slightly worsening interstitial edema. Retrocardiac airspace opacity may reflect atelectasis or confluent edema with suspected small left pleural effusion.   Electronically Signed   By: Awilda Metro   On: 09/22/2013 06:31   Dg Chest Port 1 View  09/21/2013   CLINICAL DATA:  Respiratory distress.  Intubated patient.  EXAM: PORTABLE CHEST - 1 VIEW  COMPARISON:  09/20/2013  FINDINGS: Endotracheal tube tip lies 3 cm above the carina. Nasogastric tube passes below the diaphragm well  into the stomach. Left internal jugular central venous line tip lies the caval atrial junction.  Mild hazy airspace opacity is noted in the medial right lung base. This may reflect atelectasis. Infiltrate is possible. Lungs are otherwise clear. No pleural effusion or pneumothorax  IMPRESSION: 1. Support apparatus is stable in well positioned. 2. Mild hazy opacity in the medial right lung base, likely atelectasis. This potentially could reflect a small area of infiltrate or even asymmetric edema. Lungs are otherwise clear. Findings are similar to the prior exam allowing for differences in patient positioning and technique.   Electronically Signed   By: Amie Portland M.D.   On: 09/21/2013 12:27   Dg Chest Port 1 View  09/20/2013   CLINICAL DATA:  Post IJ placement  EXAM: PORTABLE CHEST - 1 VIEW  COMPARISON:  Prior chest x-ray 09/20/2013 at 9:19 a.m.  FINDINGS: Interval placement of a left IJ approach central venous catheter. The tip of the catheter is in good position at the superior cavoatrial junction. No evidence of complicating pneumothorax. The patient remains intubated. The tip of the endotracheal tube is 4 cm above the carina. A nasogastric tube is present, the tip lies off the field of view presumably within the stomach. External defibrillator pads and metallic artifacts overlie the chest. Stable cardiomegaly, very low inspiratory volumes and mild pulmonary edema.  IMPRESSION: 1. New left IJ approach central venous catheter with the tip at the superior cavoatrial junction. No evidence of pneumothorax or other complication. 2. Otherwise, no significant interval change in the appearance of the chest.   Electronically Signed   By: Malachy Moan M.D.   On: 09/20/2013 11:48   Dg Chest Portable 1 View  09/20/2013   CLINICAL DATA:  Status post intubation.  EXAM: PORTABLE CHEST - 1 VIEW  COMPARISON:  None.  FINDINGS: 0919 hrs. Endotracheal tube tip is 3.8 cm above the base of the carina. The NG tube passes  into the stomach although the distal tip position is not included on the film. Lung volumes are low. The cardio pericardial silhouette is enlarged. Diffuse interstitial opacity is associated with vascular congestion. Telemetry leads overlie the chest. No evidence for pneumothorax or substantial pleural effusion.  IMPRESSION: Low volume film with enlargement of the cardiopericardial silhouette. Vascular congestion with interstitial opacities suggest associated edema.   Electronically Signed   By: Kennith Center M.D.   On: 09/20/2013 09:30    Medications: Scheduled Meds: . acetaminophen  500 mg Oral TID  . alteplase  2 mg Intracatheter Once  . aspirin  81 mg Oral Daily  . darbepoetin (ARANESP) injection - DIALYSIS  100 mcg Intravenous Q Wed-HD  . doxercalciferol  8 mcg Intravenous Q M,W,F-HD  . famotidine  20 mg Oral QHS  . feeding supplement (NEPRO CARB STEADY)  237 mL Oral QPC supper  . heparin subcutaneous  5,000 Units Subcutaneous Q8H  . insulin aspart  0-9 Units Subcutaneous TID WC  . midodrine  5 mg Oral Q M,W,F-HD  . multivitamin  1 tablet Oral QHS  . sevelamer carbonate  1,600 mg Oral TID WC  . [START ON 10/08/2013] Ticagrelor  90 mg Oral BID   Time Spent: 25 minutes   LOS: 17 days   Chaya Jan M.D. [facility] 10/07/2013, 5:06 PM Pager: [phone]  If 7PM-7AM, please contact night-coverage [email or web address] Password [email or web address]

## 2013-10-07 NOTE — Progress Notes (Addendum)
[facility] Progress Note   Subjective: Procedure cancelled due to too high P2Y12 levels. 2kg off with HD yest, BP 130's-150's   Filed Vitals:   10/06/13 1500 10/06/13 1505 10/06/13 2107 10/07/13 0510  BP: 148/72 157/74 138/52 159/62  Pulse: 71 73 81 80  Temp:  98.5 F (36.9 C) 99.5 F (37.5 C) 98.7 F (37.1 C)  TempSrc:  Oral Oral Oral  Resp:  16 18 18   Height:      Weight:  80.1 kg (176 lb 9.4 oz)  83 kg (182 lb 15.7 oz)  SpO2:  100% 100% 100%  Exam  Alert, no distress, sitting up in bed Clear bilat, no rales or wheezes RRR no MRG Abd +BS, soft and nontender  2+ pitting edema RLE, BKA on L  LUA AV fistula patent  Dialysis: MWF [place]  4.5h (4 hr here) 79kg BFR 250 (just started buttonhole to AVF) Profile 2 F180 No heparin  Hectorol 8 Epo 20K Venofer none  Assessment/Plan:  1. Recurrent cardiorespiratory arrest - occurring on dialysis, suspect related to poor cerebral perfusion related to #2 and volume/BP shifts during HD. Getting antplatelet Rx in anticipation of procedure on stenotic L ICA. Getting pre HD midodrine to keep BP up during HD.  2. Severe ASCVD - see above, now on DAP. 3. HTN/volume- wt's inaccurate, UF 2kg max, leg edema stable 4. ESRD on hemodialysis, no hep with GIB in December 5. Anemia - on Aranesp 100 mcg on Wed; Hb 8's, T-sat low, but high ferritin. 6. 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 7. PAD - s/p L BKA 8. CAD - diffuse small vessel disease per cath 2011  Vinson Moselle MD pager [phone]    cell [phone] 10/07/2013, 9:36 AM   Recent Labs Lab 10/04/13 0728 10/06/13 1030 10/07/13 0007  NA 133* 137 137  K 4.5 4.4 3.7  CL 92* 95* 95*  CO2 23 26 27   GLUCOSE 135* 202* 192*  BUN 39* 36* 19  CREATININE 6.84* 6.78* 3.91*  CALCIUM 9.6 9.8 9.5  PHOS 4.0 4.0  --     Recent Labs Lab 10/04/13 0728 10/06/13 1030 10/07/13 0007  AST  --   --  19  ALT  --   --  11  ALKPHOS  --    --  91  BILITOT  --   --  0.3  PROT  --   --  6.5  ALBUMIN 3.0* 2.8* 2.7*    Recent Labs Lab 10/04/13 0323 10/06/13 1030 10/07/13 0007  WBC 11.2* 8.5 8.6  NEUTROABS  --   --  6.3  HGB 8.2* 8.0* 8.4*  HCT 25.1* 24.0* 25.7*  MCV 94.7 94.9 96.3  PLT 226 245 243   . acetaminophen  500 mg Oral TID  . alteplase  2 mg Intracatheter Once  . aspirin  81 mg Oral Daily  . darbepoetin (ARANESP) injection - DIALYSIS  100 mcg Intravenous Q Wed-HD  . doxercalciferol  8 mcg Intravenous Q M,W,F-HD  . famotidine  20 mg Oral QHS  . feeding supplement (NEPRO CARB STEADY)  237 mL Oral TID BM  . heparin subcutaneous  5,000 Units Subcutaneous Q8H  . insulin aspart  0-9 Units Subcutaneous TID WC  . midodrine  5 mg Oral Q M,W,F-HD  . multivitamin  1 tablet Oral QHS  . sevelamer carbonate  1,600 mg Oral TID WC   . sodium chloride Stopped (10/04/13 1600)  sodium chloride, acetaminophen, aluminum hydroxide, bisacodyl, diphenhydrAMINE, guaiFENesin-dextromethorphan, hydrALAZINE, ondansetron (ZOFRAN) IV, prochlorperazine, prochlorperazine, prochlorperazine, RESOURCE THICKENUP CLEAR, sodium chloride, traMADol

## 2013-10-07 NOTE — Progress Notes (Signed)
[name], female   DOB: [birth year], 68 y.o.   MRN: [id number]   Dr Corliss Skainseveshwar has spoken to Dr Ardyth HarpsHernandez regarding pt and Plavix issue. Change meds to Brilinta  Will recheck P2Y12 1/30  Plan for procedure to be rescheduled for 2/2 or 2/3 Will keep pt/family and primary MD aware.

## 2013-10-07 NOTE — Progress Notes (Signed)
NUTRITION FOLLOW UP  Intervention:   Continue Nepro Shake po once daily, each supplement provides 425 kcal and 19 grams protein  Nutritional Services Ambassador to assist patient with meal options.  Allow double protein portions.  Nutrition Dx:   Inadequate oral intake now related to decreased appetite as evidenced by variable intake of meals. Resolved.  Goal:   Intake to meet >90% of estimated nutrition needs. Met.  Monitor:   PO intake, labs, weight trend.  Assessment:   Pt admitted on 1/12 after cardiac arrest during HD session. Cerebral arteriogram procedure for today was cancelled due to too high P2Y12 levels. Patient with recurrent cardiorespiratory arrest occuring during dialysis likely related to poor cerebral perfusion with severe ASCVD.  Currently receiving Renal diet with good oral intake. Consuming 100% of meals. She says that she is eating very well, but wants more to eat. She is still hungry after meals. Will allow double protein portions for patient to improve satiety. She has been drinking Nepro once daily in the evening.  Height: Ht Readings from Last 1 Encounters:  10/01/13 _0  (1.676 m)    Weight Status:   Wt Readings from Last 1 Encounters:  10/07/13 182 lb 15.7 oz (83 kg)  09/24/13  184 lb 8.4 oz (83.7 kg)   Re-estimated needs:  Kcal: 1600-1800 Protein: 80-90 gm Fluid: 1.2 L  Skin: left groin incision  Diet Order: Renal 60/70-2-2 with 1200 ml fluid restriction; Nepro TID between meals.   Intake/Output Summary (Last 24 hours) at 10/07/13 1013 Last data filed at 10/06/13 1950  Gross per 24 hour  Intake    237 ml  Output   2000 ml  Net  -1763 ml    Last BM: 1/28   Labs:   Recent Labs Lab 10/04/13 0728 10/06/13 1030 10/07/13 0007  NA 133* 137 137  K 4.5 4.4 3.7  CL 92* 95* 95*  CO2 _1 BUN 39* 36* 19  CREATININE 6.84* 6.78* 3.91*  CALCIUM 9.6 9.8 9.5  PHOS 4.0 4.0  --   GLUCOSE 135* 202* 192*    CBG (last 3)   Recent  Labs  10/06/13 1603 10/06/13 2103 10/07/13 0743  GLUCAP 91 206* 139*    Scheduled Meds: . acetaminophen  500 mg Oral TID  . alteplase  2 mg Intracatheter Once  . aspirin  81 mg Oral Daily  . darbepoetin (ARANESP) injection - DIALYSIS  100 mcg Intravenous Q Wed-HD  . doxercalciferol  8 mcg Intravenous Q M,W,F-HD  . famotidine  20 mg Oral QHS  . feeding supplement (NEPRO CARB STEADY)  237 mL Oral TID BM  . heparin subcutaneous  5,000 Units Subcutaneous Q8H  . insulin aspart  0-9 Units Subcutaneous TID WC  . midodrine  5 mg Oral Q M,W,F-HD  . multivitamin  1 tablet Oral QHS  . sevelamer carbonate  1,600 mg Oral TID WC    Continuous Infusions: . sodium chloride Stopped (10/04/13 1600)     Molli Barrows, RD, LDN, [place] Pager [phone] After Hours Pager [phone]

## 2013-10-07 NOTE — Progress Notes (Signed)
Physical Therapy Treatment Patient Details Name: Briana Gould MRN: [id number] DOB: 1946-04-22 Today's Date: 10/07/2013 Time: 4098-1191 PT Time Calculation (min): 19 min  PT Assessment / Plan / Recommendation  History of Present Illness 68 year old female w/ ESRD, [facility] after being treated for UGIB while on coumadin for Right IJ thrombus stroke prevention. Was in usual state of health, getting stronger after her d/c. On 1/12 was at regular scheduled HD. About 20 minutes into HD she slumped over and became unresponsive. Initial rhythm was reported as PEA. CPR started. Reported 20 minutes CPR before ROSC, no drugs. Transferred to [facility] s/p arrest. Of note, she has PMHx of left BKA and wears a prosthesis.     PT Comments   Pt is progressing well with her mobility.  She reports her "platelets" are why they cancelled her procedure today.  She is hopeful to get strong enough to go home and asked PT for some arm exercises (passed this along to OT, and did some seated against gravity arm exercises with her as well).  She is getting more steady on her feet and requiring less assistance.  She continues to be appropriate for CIR at her current level, but if she improves much more she may be able to go home with [facility] services.    Follow Up Recommendations  CIR     Does the patient have the potential to tolerate intense rehabilitation    Yes  Barriers to Discharge   Her husband uses a RW and can only provide supervision.        Equipment Recommendations  None recommended by PT    Recommendations for Other Services   NA  Frequency Min 3X/week   Progress towards PT Goals Progress towards PT goals: Progressing toward goals  Plan Current plan remains appropriate    Precautions / Restrictions Precautions Precautions: Fall Required Braces or Orthoses: Other Brace/Splint Other Brace/Splint: Lt prosthesis   Pertinent Vitals/Pain See vitals flow sheet.    Mobility   Transfers Overall transfer level: Needs assistance Equipment used: Rolling walker (2 wheeled) Transfers: Sit to/from Stand Sit to Stand: Min guard;From elevated surface General transfer comment: Min guard assist to support trunk for balance during transition to stand from bed.  Verbal cues for safe hand placement.  Ambulation/Gait Ambulation/Gait assistance: Min assist Ambulation Distance (Feet): 95 Feet Assistive device: Rolling walker (2 wheeled) Gait Pattern/deviations: Step-through pattern;Trunk flexed Gait velocity: decreased Gait velocity interpretation: <1.8 ft/sec, indicative of risk for recurrent falls General Gait Details: Pt donned her prosthesis independently while seated EOB.  Verbal cues needed for safe use of RW and to try not to walk too close to the front of the walker.  Min assist for balance especially while turning.      Exercises General Exercises - Upper Extremity Shoulder Flexion: AROM;Both;10 reps;Seated Elbow Flexion: AROM;Both;10 reps;Seated General Exercises - Lower Extremity Long Arc Quad: AROM;Both;10 reps;Seated Hip ABduction/ADduction: AROM;Both;10 reps;Seated Hip Flexion/Marching: AROM;Both;10 reps;Seated Toe Raises: AROM;Right;10 reps;Seated Heel Raises: AROM;Right;10 reps;Seated     PT Goals (current goals can now be found in the care plan section) Acute Rehab PT Goals Patient Stated Goal: get out of here  Visit Information  Last PT Received On: 10/07/13 Assistance Needed: +1 History of Present Illness: 68 year old female w/ ESRD, [facility] after being treated for UGIB while on coumadin for Right IJ thrombus stroke prevention. Was in usual state of health, getting stronger after her d/c. On  1/12 was at regular scheduled HD. About 20 minutes into HD she slumped over and became unresponsive. Initial rhythm was reported as PEA. CPR started. Reported 20 minutes CPR before ROSC, no drugs. Transferred to [facility] s/p arrest. Of note, she  has PMHx of left BKA and wears a prosthesis.      Subjective Data  Subjective: Pt had RN call PT because she was ready to walk and do her therapy.   Patient Stated Goal: get out of here   Cognition  Cognition Arousal/Alertness: Awake/alert Behavior During Therapy: WFL for tasks assessed/performed Overall Cognitive Status: Within Functional Limits for tasks assessed (not specifically tested)    Balance  Balance Sitting-balance support: No upper extremity supported;Feet supported Sitting balance-Leahy Scale: Good Standing balance support: Bilateral upper extremity supported Standing balance-Leahy Scale: Fair  End of Session PT - End of Session Equipment Utilized During Treatment: Gait belt;Other (comment) (L leg prosthesis) Activity Tolerance: Patient tolerated treatment well Patient left: in chair;with call bell/phone within reach     Frankfort SquareRebecca B. Diem Dicocco, PT, DPT [phone]   10/07/2013, 1:30 PM

## 2013-10-07 NOTE — Progress Notes (Addendum)
[name], female   DOB: 1946/01/10, 68 y.o.   MRN: [id number]   Pt was scheduled for cerebral arteriogram with L ICA angioplasty stent this am.  Has been on Plavix 75 mg daily since 1/24 P2Y12 1/27: 384             1/28: 369             1/29: 351 We had given bolus dose of Plavix yesterday With no real inhibition noted. Rechecked this am and still at 351---still too high to safely proceed  Procedure cancelled this am Pt and family aware Dr Corliss Skainseveshwar to discuss with them now new plan  Will DC Plavix now

## 2013-10-08 LAB — CBC
HCT: 20.4 % — ABNORMAL LOW (ref 36.0–46.0)
HCT: 20 % — ABNORMAL LOW (ref 36.0–46.0)
Hemoglobin: 6.6 g/dL — CL (ref 12.0–15.0)
Hemoglobin: 6.6 g/dL — CL (ref 12.0–15.0)
MCH: 31 pg (ref 26.0–34.0)
MCH: 31.4 pg (ref 26.0–34.0)
MCHC: 32.4 g/dL (ref 30.0–36.0)
MCHC: 33 g/dL (ref 30.0–36.0)
MCV: 95.8 fL (ref 78.0–100.0)
MCV: 95.2 fL (ref 78.0–100.0)
PLATELETS: 258 10*3/uL (ref 150–400)
Platelets: 252 10*3/uL (ref 150–400)
RBC: 2.13 MIL/uL — ABNORMAL LOW (ref 3.87–5.11)
RBC: 2.1 MIL/uL — ABNORMAL LOW (ref 3.87–5.11)
RDW: 18.2 % — AB (ref 11.5–15.5)
RDW: 18.3 % — ABNORMAL HIGH (ref 11.5–15.5)
WBC: 12.5 10*3/uL — ABNORMAL HIGH (ref 4.0–10.5)
WBC: 14.2 10*3/uL — ABNORMAL HIGH (ref 4.0–10.5)

## 2013-10-08 LAB — PLATELET INHIBITION P2Y12: Platelet Function  P2Y12: 304 [PRU] (ref 194–418)

## 2013-10-08 LAB — CREATININE, SERUM
Creatinine, Ser: 5.99 mg/dL — ABNORMAL HIGH (ref 0.50–1.10)
GFR, EST AFRICAN AMERICAN: 8 mL/min — AB (ref >90)
GFR, EST NON AFRICAN AMERICAN: 7 mL/min — AB (ref >90)

## 2013-10-08 LAB — PREPARE RBC (CROSSMATCH)

## 2013-10-08 LAB — GLUCOSE, CAPILLARY
GLUCOSE-CAPILLARY: 193 mg/dL — AB (ref 70–99)
GLUCOSE-CAPILLARY: 169 mg/dL — AB (ref 70–99)
GLUCOSE-CAPILLARY: 147 mg/dL — AB (ref 70–99)
Glucose-Capillary: 187 mg/dL — ABNORMAL HIGH (ref 70–99)

## 2013-10-08 MED ORDER — PANTOPRAZOLE SODIUM 40 MG IV SOLR
40.0000 mg | Freq: Two times a day (BID) | INTRAVENOUS | Status: DC
Start: 2013-10-08 — End: 2013-10-15
  Administered 2013-10-08 – 2013-10-15 (×14): 40 mg via INTRAVENOUS
  Filled 2013-10-08 (×16): qty 40

## 2013-10-08 MED ORDER — NIMODIPINE 30 MG PO CAPS
60.0000 mg | ORAL_CAPSULE | ORAL | Status: DC
  Filled 2013-10-08: qty 2

## 2013-10-08 MED ORDER — SODIUM CHLORIDE 0.9 % IV SOLN
Freq: Once | INTRAVENOUS | Status: AC
  Administered 2013-10-11: 01:00:00 via INTRAVENOUS

## 2013-10-08 MED ORDER — CEFAZOLIN SODIUM-DEXTROSE 2-3 GM-% IV SOLR: 2.0000 g | Freq: Once | INTRAVENOUS | Status: DC

## 2013-10-08 NOTE — Progress Notes (Signed)
RN received phone call from Lab at 1940 with critical value of hemoglobin at 6.6.  Briana Pastelom Callahan, NP notified. Pt family updated per pt request. New orders received. Will continue to monitor pt. [name], [name]

## 2013-10-08 NOTE — Progress Notes (Signed)
Pt complained of "not feeling well" and asked for emesis basin. Pt vomited 1000cc coffee ground emesis at 1300. Pt given IV zofran.  MD, Dr. Ardyth HarpsHernandez, notified at time and came to pt  bedside. Pt BP 120/58. New orders received and hemodialysis cancelled per MD. Will continue to monitor pt. [name], Kennice Finnie C

## 2013-10-08 NOTE — Progress Notes (Signed)
CRITICAL VALUE ALERT  Critical value received:  hgb 6.6  Date of notification:  10/08/2013  Time of notification:  2245  Critical value read back:yes  Nurse who received alert:  Eliane DecreeJalesa Kenshin Splawn  MD notified (1st page):  NP Everett Graffhomas Callahan  Time of first page:  [phone]  MD notified (2nd page):  Time of second page:  Responding MD:  Everett Graffhomas Callahan, NP  Time MD responded:  1945  Np aware of hgb. Came to bedside around 1945 to discuss getting a blood transfusion at that time. Awaiting type & screen as well as for blood bank to call so that the blood can be  Transfused. Sanda LingerMilam, Darina Hartwell R, RN

## 2013-10-08 NOTE — Progress Notes (Signed)
I was informed by Dr. Ardyth HarpsHernandez about the patient's hematemesis.  She was reported to have 1 liter of coffee-ground emesis.  I confirmed this finding with the patient's nurse who witnessed the event.  Mild drop in her HGB.  The bleeding is not unexpected, however, I am uncertain why she was not on her Protonix.  I recalled that she was on the medication at the time of the recent reconsultation.  Regardless, she is stable and she needs to be on a PPI indefinitely.  Agree with holding Brilinta and ASA at this time.

## 2013-10-08 NOTE — Progress Notes (Signed)
CRITICAL VALUE ALERT  Critical value received:  Hemoglobin 6.6  Date of notification:  10/08/13   Time of notification:  1940  Critical value read back:yes  Nurse who received alert:  Bernadene PersonEmily Zylpha Poynor, RN   MD notified (1st page):  Lenny Pastelom Callahan Time of first page:  1941  MD notified (2nd page): n/a    Time of second page: n/a  Responding MD:  Lenny Pastelom callahan  Time MD responded: [phone]

## 2013-10-08 NOTE — Progress Notes (Addendum)
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 Readmitted for PEA arrest while on dialysis. Patient cardiac arrest etiology is not fully determined, question carotid stenosis, severe pulm HTN or severe CAD may be contributing when she is becoming hypotensivie during dialysis sessions, cardiac arrytmia. Plan is for intracranial angiogram and or possible stent 1-29 to see if that help with circulation. Not safe to discharge patient to outpatient dialysis. Appreciate Dr Arlean HoppingSchertz help.   Assessment/Plan:   -A fib: RVR with subsequent cardiac pause 6.8 sec on 1-23 after dialysis. Patient Blood pressure decrease during dialysis 1-23. HR increased. She received IV bolus. Cardiology consulted. She was on coumadin at some point but this was stop due to bleed. Difficult situation with anticoagulation. Patient converted spontaneously. She had a pause of 6.8 sec. She didn't received any medications.  No good candidate for pacer per cardio evaluation. No further evaluation per cardio.  -now sinus rhythm.  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  - Multiple times - etiology not fully determined, but suspect related to cerebral hypoperfusion during HD with volume and BP shifts. -No further evaluation per cardio.   Carotid artery stenosis - 1-39% stenosis involving the left internal carotid artery. - Findings consistent with 60-79% right ICA stenosis  - interventional radiology has been consulted, cerebral angiogram done  by Dr. Corliss Skainseveshwar.  -Severe stenosis of LT ICA pet cavernous junction of  95% , 60 % stenosis of RT ICA prox.. 50 % steosis of LT ICA petrous seg. Occluded RT VBJ -Plan for intracranial angiogram and or possible stent 1-29. -Per Dr. Corliss Skainseveshwar: resistant to plavix. Switch to Brilinta and reschedule angiogram for early next week.  Upper GI Bleed -1/30: had a episode of coffee ground emesis (aprox 1 L). -Will DC ASA and Brillinta. -Protonix IV BID. -Had EGD in 12/14 with findings of esophagitis, antral and duodenal ulcers. -Hemodynamically stable. -Stat CBC and follow q 8 hours for 24 hours. -Have discussed with Dr. Elnoria HowardHung who will see patient in consultation.  -Leukocytosis; Resolved  -Chest pain: resolved.  -Musculoskeletal likely due to CPR with chest wall tenderness improving:  - Placed her on tramadol as needed and Tylenol 500mg  TID.   -ESRD (end stage renal disease)  - per nephrology.  -Will hold HD 1/30 due to acute episode of GI Bleed.  -Acute Respiratory failure with hypoxia/ Pulmonary edema/ diastolic CHF  - s/p arrest  - nephrology team to manage fluid balance    Severe Pulmonary HTN with moderate TR, CAD  - cath in 2011 revealed diffuse distal disease   Right IJ thrombus  -found on a previous admission- not a candidate for anticoagulation due to PUD with bleed in 12/14 in setting of Coumadin   PUD - cont Pepcid   Hypertension  - Hydralazine  PRN. Careful with Hypotension.   Encephalopathy acute - likely due to anoxia secondary to cardiac arrest- may have new cognitive baseline-  - Levaquin started on 1/16- continue for 5 days . Finished course.  -Resolved.   Severe PAD with left BKA.  DVT Prophylaxis: SCDs  Code Status: Full code  Family Communication: Care discussed with patient.   Disposition: Transfer to telemetry.   SIGNIFICANT EVENTS / STUDIES:  1/12 cardiac arrest/ PEA at HD. 20 minutes estimated to ROSC  1/12 Echocardiogram: LVEF 50-55%. No RWMAs. Moderate LVH. Probably mild AS. Mild MR. RVSP est 69 mmHg  1/12 CT  head: Age uncertain small infarct mid pons. Small infarct in this area may be recent.  1/13 Cognition much improved. Failed SBT  1/15 Extubated successfully  1/20: Cerebral angiogram done 1/30: Upper GI Bleed (coffee-ground emesis).   Subjective:  Significant episode of coffee-ground emesis today.  Objective: Weight change: 4.4 kg (9 lb 11.2 oz)  Intake/Output Summary (Last 24 hours) at 10/08/13 1353 Last data filed at 10/08/13 0900  Gross per 24 hour  Intake    480 ml  Output      0 ml  Net    480 ml   Blood pressure 120/58, pulse 91, temperature 98.9 F (37.2 C), temperature source Oral, resp. rate 18, height 5\' 6"  (1.676 m), weight 81.5 kg (179 lb 10.8 oz), SpO2 99.00%.  Physical Exam: General: Alert and awake, oriented. CVS: S1-S2 clear Chest: CTAB Abdomen: soft nontender, nondistended, normal bowel sounds  Extremities: no cyanosis, clubbing left BKA,   Lab Results: Basic Metabolic Panel:  Recent Labs Lab 10/06/13 1030 10/07/13 0007 10/08/13 0442  NA 137 137  --   K 4.4 3.7  --   CL 95* 95*  --   CO2 26 27  --   GLUCOSE 202* 192*  --   BUN 36* 19  --   CREATININE 6.78* 3.91* 5.99*  CALCIUM 9.8 9.5  --   PHOS 4.0  --   --    Liver Function Tests:  Recent Labs Lab 10/06/13 1030 10/07/13 0007  AST  --  19  ALT  --  11  ALKPHOS  --  91  BILITOT  --  0.3  PROT  --  6.5  ALBUMIN 2.8* 2.7*   No results found for this basename: LIPASE, AMYLASE,  in the last 168 hours No results found for this basename: AMMONIA,  in the last 168 hours CBC:  Recent Labs Lab 10/06/13 1030 10/07/13 0007  WBC 8.5 8.6  NEUTROABS  --  6.3  HGB 8.0* 8.4*  HCT 24.0* 25.7*  MCV 94.9 96.3  PLT 245 243   Cardiac Enzymes: No results found for this basename: CKTOTAL, CKMB, CKMBINDEX, TROPONINI,  in the last 168 hours BNP: No components found with this basename: POCBNP,  CBG:  Recent Labs Lab 10/07/13 1112 10/07/13 1621 10/07/13 2013 10/08/13 0752 10/08/13 1151   GLUCAP 241* 121* 180* 193* 187*     Micro Results: No results found for this or any previous visit (from the past 240 hour(s)).  Studies/Results: Ct Head Wo Contrast  09/20/2013   CLINICAL DATA:  Altered mental status ; status post cardiopulmonary resuscitation  EXAM: CT HEAD WITHOUT CONTRAST  TECHNIQUE: Contiguous axial images were obtained from the base of the skull through the vertex without intravenous contrast.  COMPARISON:  None.  FINDINGS: There is age related volume loss. There is no demonstrable mass, hemorrhage, extra-axial fluid collection, or midline shift. There is mild small vessel disease in the centra semiovale bilaterally. There is decreased attenuation in the midline of the upper to mid pons which may represent a small recent infarct in this area. No other acute appearing infarct is appreciable.  Bony calvarium appears intact.  The mastoid air cells are clear.  IMPRESSION: Age uncertain small infarct mid pons. Small infarct in this area may be recent.  There is underlying age related volume loss with mild periventricular small vessel disease. There is no intracranial mass or hemorrhage. No gray-white compartment edema is appreciable on this study.   Electronically Signed   By: Bretta Bang M.D.   On: 09/20/2013 12:54   Dg Chest Port 1 View  09/25/2013   CLINICAL DATA:  Edema after dialysis.  EXAM: PORTABLE CHEST - 1 VIEW  COMPARISON:  One-view chest 09/24/2013.  FINDINGS: Cardiac enlargement is stable. A left IJ line is stable in position. Interstitial edema is improved. No focal airspace consolidation is evident.  IMPRESSION: 1. Improved interstitial edema. 2. Stable cardiomegaly.   Electronically Signed   By: Gennette Pac M.D.   On: 09/25/2013 21:55   Dg Chest Port 1 View  09/24/2013   CLINICAL DATA:  Respiratory failure  EXAM: PORTABLE CHEST - 1 VIEW  COMPARISON:  09/23/2013  FINDINGS: Interval tracheal and esophageal extubation. Left IJ catheter in stable position, tip  at the superior cavoatrial junction.  Unchanged cardiomegaly and pulmonary edema. These changes could obscure superimposed infection. No evidence of increased pleural fluid. No pneumothorax.  IMPRESSION: 1. Stable lung volumes after tracheal extubation. 2. Unchanged pulmonary edema, which could obscure superimposed consolidation.   Electronically Signed   By: Tiburcio Pea M.D.   On: 09/24/2013 06:23   Dg Chest Port 1 View  09/23/2013   CLINICAL DATA:  Respiratory failure.  EXAM: PORTABLE CHEST - 1 VIEW  COMPARISON:  09/22/2013.  FINDINGS: Endotracheal tube, left IJ line, NG tube in stable position. Persistent cardiomegaly and pulmonary venous congestion present. Interstitial prominence present. These findings consistent with congestive heart failure. Left base atelectasis and/or infiltrate present. Left pleural effusion has improved. No pneumothorax.  IMPRESSION: 1. Stable line and tube positions. 2. Findings consistent with congestive heart failure with pulmonary interstitial edema again noted. Left pleural effusion has improved. 3. Left lower lobe atelectasis and/or infiltrate.   Electronically Signed   By: Maisie Fus  Register   On: 09/23/2013 07:36   Dg Chest Port 1 View  09/22/2013   CLINICAL DATA:  Respiratory failure.  EXAM: PORTABLE CHEST - 1 VIEW  COMPARISON:  Chest radiograph September 21, 2013  FINDINGS: Endotracheal tube tip projects 4.2 cm above the carina. Left internal jugular central venous catheter with distal tip at the cavoatrial junction. Nasogastric tube past the gastroesophageal junction region code though, tip is not visualized. No pneumothorax.  Stable cardiomegaly, worsening interstitial edema, with minimal patchy airspace opacity in left lung base, unchanged. Suspected small left pleural effusion. Soft tissue planes and included osseous structures are unchanged.  IMPRESSION: No apparent change in life-support lines.  Stable cardiomegaly with slightly worsening interstitial edema.  Retrocardiac airspace opacity may reflect atelectasis or confluent edema with suspected small left pleural effusion.   Electronically Signed   By: Awilda Metro   On: 09/22/2013 06:31   Dg Chest Port 1 View  09/21/2013   CLINICAL DATA:  Respiratory distress.  Intubated patient.  EXAM: PORTABLE CHEST - 1 VIEW  COMPARISON:  09/20/2013  FINDINGS: Endotracheal tube tip lies 3 cm above the carina. Nasogastric tube passes below the diaphragm well into the stomach. Left internal jugular central venous line tip lies the caval atrial junction.  Mild hazy airspace opacity is noted in the medial right lung base. This may reflect atelectasis. Infiltrate is possible. Lungs are otherwise clear. No  pleural effusion or pneumothorax  IMPRESSION: 1. Support apparatus is stable in well positioned. 2. Mild hazy opacity in the medial right lung base, likely atelectasis. This potentially could reflect a small area of infiltrate or even asymmetric edema. Lungs are otherwise clear. Findings are similar to the prior exam allowing for differences in patient positioning and technique.   Electronically Signed   By: Amie Portland M.D.   On: 09/21/2013 12:27   Dg Chest Port 1 View  09/20/2013   CLINICAL DATA:  Post IJ placement  EXAM: PORTABLE CHEST - 1 VIEW  COMPARISON:  Prior chest x-ray 09/20/2013 at 9:19 a.m.  FINDINGS: Interval placement of a left IJ approach central venous catheter. The tip of the catheter is in good position at the superior cavoatrial junction. No evidence of complicating pneumothorax. The patient remains intubated. The tip of the endotracheal tube is 4 cm above the carina. A nasogastric tube is present, the tip lies off the field of view presumably within the stomach. External defibrillator pads and metallic artifacts overlie the chest. Stable cardiomegaly, very low inspiratory volumes and mild pulmonary edema.  IMPRESSION: 1. New left IJ approach central venous catheter with the tip at the superior cavoatrial  junction. No evidence of pneumothorax or other complication. 2. Otherwise, no significant interval change in the appearance of the chest.   Electronically Signed   By: Malachy Moan M.D.   On: 09/20/2013 11:48   Dg Chest Portable 1 View  09/20/2013   CLINICAL DATA:  Status post intubation.  EXAM: PORTABLE CHEST - 1 VIEW  COMPARISON:  None.  FINDINGS: 0919 hrs. Endotracheal tube tip is 3.8 cm above the base of the carina. The NG tube passes into the stomach although the distal tip position is not included on the film. Lung volumes are low. The cardio pericardial silhouette is enlarged. Diffuse interstitial opacity is associated with vascular congestion. Telemetry leads overlie the chest. No evidence for pneumothorax or substantial pleural effusion.  IMPRESSION: Low volume film with enlargement of the cardiopericardial silhouette. Vascular congestion with interstitial opacities suggest associated edema.   Electronically Signed   By: Kennith Center M.D.   On: 09/20/2013 09:30    Medications: Scheduled Meds: . [START ON 10/11/2013] sodium chloride   Intravenous Once  . acetaminophen  500 mg Oral TID  . alteplase  2 mg Intracatheter Once  . [START ON 10/11/2013]  ceFAZolin (ANCEF) IV  2 g Intravenous Once  . darbepoetin (ARANESP) injection - DIALYSIS  100 mcg Intravenous Q Wed-HD  . doxercalciferol  8 mcg Intravenous Q M,W,F-HD  . famotidine  20 mg Oral QHS  . feeding supplement (NEPRO CARB STEADY)  237 mL Oral QPC supper  . heparin subcutaneous  5,000 Units Subcutaneous Q8H  . insulin aspart  0-9 Units Subcutaneous TID WC  . midodrine  5 mg Oral Q M,W,F-HD  . multivitamin  1 tablet Oral QHS  . [START ON 10/11/2013] niMODipine  60 mg Oral 60 min Pre-Op  . pantoprazole (PROTONIX) IV  40 mg Intravenous Q12H  . sevelamer carbonate  1,600 mg Oral TID WC   Time Spent: 45 minutes   LOS: 18 days   Chaya Jan M.D. [facility] 10/08/2013, 1:53 PM Pager: [phone]  If 7PM-7AM, please  contact night-coverage [email or web address] Password [email or web address]

## 2013-10-08 NOTE — Progress Notes (Signed)
[facility] Progress Note   Subjective: Feels tired, worn out  Filed Vitals:   10/06/13 2107 10/07/13 0510 10/07/13 1230 10/08/13 0407  BP: 138/52 159/62 154/68 143/66  Pulse: 81 80 78 82  Temp: 99.5 F (37.5 C) 98.7 F (37.1 C) 98.5 F (36.9 C) 98.9 F (37.2 C)  TempSrc: Oral Oral Oral Oral  Resp: 18 18 18    Height:      Weight:  83 kg (182 lb 15.7 oz)  83.3 kg (183 lb 10.3 oz)  SpO2: 100% 100% 100% 100%  Exam  Alert, no distress, sitting up in bed Clear bilat, no rales or wheezes RRR no MRG Abd +BS, soft and nontender  2+ pitting edema RLE, BKA on L  LUA AV fistula patent  Dialysis: MWF [place]  4.5h (4 hr here) 79kg BFR 250 (just started buttonhole to AVF) Profile 2 F180 No heparin  Hectorol 8 Epo 20K Venofer none  Assessment/Plan:  1. Recurrent cardiorespiratory arrest - occurring on dialysis, suspect related to poor cerebral perfusion related to #2 and volume/BP shifts during HD. Getting antplatelet Rx in anticipation of procedure on stenotic L ICA. Getting pre HD midodrine to keep BP up during HD.  2. Severe ASCVD - see above, now on DAP. Carotid procedure rescheduled for next week 3. HTN/volume- wt's inaccurate, UF 2kg max, leg edema stable 4. ESRD on hemodialysis, no hep with GIB in December 5. Anemia - on Aranesp 100 mcg on Wed; Hb 8's, T-sat low, but high ferritin. 6. 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 7. PAD - s/p L BKA 8. CAD - diffuse small vessel disease per cath 2011  Vinson Moselle MD pager [phone]    cell [phone] 10/08/2013, 10:15 AM   Recent Labs Lab 10/04/13 0728 10/06/13 1030 10/07/13 0007 10/08/13 0442  NA 133* 137 137  --   K 4.5 4.4 3.7  --   CL 92* 95* 95*  --   CO2 23 26 27   --   GLUCOSE 135* 202* 192*  --   BUN 39* 36* 19  --   CREATININE 6.84* 6.78* 3.91* 5.99*  CALCIUM 9.6 9.8 9.5  --   PHOS 4.0 4.0  --   --     Recent Labs Lab 10/04/13 0728 10/06/13 1030  10/07/13 0007  AST  --   --  19  ALT  --   --  11  ALKPHOS  --   --  91  BILITOT  --   --  0.3  PROT  --   --  6.5  ALBUMIN 3.0* 2.8* 2.7*    Recent Labs Lab 10/04/13 0323 10/06/13 1030 10/07/13 0007  WBC 11.2* 8.5 8.6  NEUTROABS  --   --  6.3  HGB 8.2* 8.0* 8.4*  HCT 25.1* 24.0* 25.7*  MCV 94.7 94.9 96.3  PLT 226 245 243   . acetaminophen  500 mg Oral TID  . alteplase  2 mg Intracatheter Once  . aspirin  81 mg Oral Daily  . darbepoetin (ARANESP) injection - DIALYSIS  100 mcg Intravenous Q Wed-HD  . doxercalciferol  8 mcg Intravenous Q M,W,F-HD  . famotidine  20 mg Oral QHS  . feeding supplement (NEPRO CARB STEADY)  237 mL Oral QPC supper  . heparin subcutaneous  5,000 Units Subcutaneous Q8H  . insulin aspart  0-9 Units Subcutaneous TID WC  . midodrine  5 mg Oral Q M,W,F-HD  . multivitamin  1 tablet Oral QHS  . sevelamer carbonate  1,600 mg Oral TID WC  . Ticagrelor  90 mg Oral BID   . sodium chloride Stopped (10/04/13 1600)   sodium chloride, acetaminophen, aluminum hydroxide, bisacodyl, diphenhydrAMINE, guaiFENesin-dextromethorphan, hydrALAZINE, ondansetron (ZOFRAN) IV, prochlorperazine, prochlorperazine, prochlorperazine, RESOURCE THICKENUP CLEAR, sodium chloride, traMADol

## 2013-10-09 LAB — RENAL FUNCTION PANEL
Albumin: 2.7 g/dL — ABNORMAL LOW (ref 3.5–5.2)
BUN: 101 mg/dL — ABNORMAL HIGH (ref 6–23)
CO2: 22 mEq/L (ref 19–32)
Calcium: 9.6 mg/dL (ref 8.4–10.5)
Chloride: 91 mEq/L — ABNORMAL LOW (ref 96–112)
Creatinine, Ser: 7.79 mg/dL — ABNORMAL HIGH (ref 0.50–1.10)
GFR calc Af Amer: 6 mL/min — ABNORMAL LOW (ref >90)
GFR calc non Af Amer: 5 mL/min — ABNORMAL LOW (ref >90)
Glucose, Bld: 209 mg/dL — ABNORMAL HIGH (ref 70–99)
POTASSIUM: 5.2 meq/L (ref 3.7–5.3)
Phosphorus: 3.7 mg/dL (ref 2.3–4.6)
Sodium: 135 mEq/L — ABNORMAL LOW (ref 137–147)

## 2013-10-09 LAB — GLUCOSE, CAPILLARY
Glucose-Capillary: 151 mg/dL — ABNORMAL HIGH (ref 70–99)
Glucose-Capillary: 142 mg/dL — ABNORMAL HIGH (ref 70–99)
Glucose-Capillary: 117 mg/dL — ABNORMAL HIGH (ref 70–99)

## 2013-10-09 LAB — CBC
HCT: 23.4 % — ABNORMAL LOW (ref 36.0–46.0)
HEMOGLOBIN: 7.8 g/dL — AB (ref 12.0–15.0)
MCH: 32 pg (ref 26.0–34.0)
MCHC: 33.3 g/dL (ref 30.0–36.0)
MCV: 95.9 fL (ref 78.0–100.0)
Platelets: 246 10*3/uL (ref 150–400)
RBC: 2.44 MIL/uL — AB (ref 3.87–5.11)
RDW: 17.1 % — ABNORMAL HIGH (ref 11.5–15.5)
WBC: 15 10*3/uL — AB (ref 4.0–10.5)

## 2013-10-09 LAB — PREPARE RBC (CROSSMATCH)

## 2013-10-09 MED ORDER — DOXERCALCIFEROL 4 MCG/2ML IV SOLN
INTRAVENOUS | Status: AC
  Administered 2013-10-09: 4 ug via INTRAVENOUS
  Filled 2013-10-09: qty 2

## 2013-10-09 MED ORDER — ACETAMINOPHEN 325 MG PO TABS
ORAL_TABLET | ORAL | Status: AC
  Filled 2013-10-09: qty 2

## 2013-10-09 MED ORDER — ALTEPLASE 100 MG IV SOLR
2.0000 mg | Freq: Once | INTRAVENOUS | Status: DC
  Filled 2013-10-09: qty 2

## 2013-10-09 MED ORDER — DARBEPOETIN ALFA-POLYSORBATE 200 MCG/0.4ML IJ SOLN
200.0000 ug | INTRAMUSCULAR | Status: DC
  Administered 2013-10-13: 200 ug via INTRAVENOUS
  Filled 2013-10-09: qty 0.4

## 2013-10-09 NOTE — Progress Notes (Signed)
[facility] Progress Note   Subjective: Feels tired, worn out  Filed Vitals:   10/09/13 0300 10/09/13 0322 10/09/13 0500 10/09/13 0506  BP: 94/52 108/69  91/54  Pulse: 55 67  83  Temp: 98.3 F (36.8 C) 98.2 F (36.8 C)  98.6 F (37 C)  TempSrc: Oral Oral  Oral  Resp: 20 18  20   Height:      Weight:   80.831 kg (178 lb 3.2 oz)   SpO2: 100% 99%  99%  Exam  Alert, no distress, sitting up in bed Clear bilat, no rales or wheezes RRR no MRG Abd +BS, soft and nontender  2+ pitting edema RLE, BKA on L  LUA AV fistula patent  Dialysis: MWF [place]  4.5h (4 hr here) 79kg BFR 250 (just started buttonhole to AVF) Profile 2 F180 No heparin  Hectorol 8 Epo 20K Venofer none  Assessment: 1 Hematemesis 2 Hypotension 3 Recurrent cardiac arrest 4 Severe ASCVD 5 ESRD on HD 6 Anemia 7 2HPTH 8 HTN / mild vol excess 9 CAD diffuse small vessel dz per cath 2011  Plan- BP's low, needs HD today, will give midodrine and 2u more units prbc's with HD today to try and keep BP up ^ epo to max, transfuse to keep Hb >9. Will ask Gen surg to see non emergently regarding possibility of PD cath placement  Vinson Moselleob Loy Little MD pager [phone]    cell [phone] 10/09/2013, 10:19 AM   Recent Labs Lab 10/04/13 0728 10/06/13 1030 10/07/13 0007 10/08/13 0442  NA 133* 137 137  --   K 4.5 4.4 3.7  --   CL 92* 95* 95*  --   CO2 23 26 27   --   GLUCOSE 135* 202* 192*  --   BUN 39* 36* 19  --   CREATININE 6.84* 6.78* 3.91* 5.99*  CALCIUM 9.6 9.8 9.5  --   PHOS 4.0 4.0  --   --     Recent Labs Lab 10/04/13 0728 10/06/13 1030 10/07/13 0007  AST  --   --  19  ALT  --   --  11  ALKPHOS  --   --  91  BILITOT  --   --  0.3  PROT  --   --  6.5  ALBUMIN 3.0* 2.8* 2.7*    Recent Labs Lab 10/07/13 0007 10/08/13 1846 10/08/13 2209 10/09/13 0420  WBC 8.6 12.5* 14.2* 15.0*  NEUTROABS 6.3  --   --   --   HGB 8.4* 6.6* 6.6* 7.8*  HCT 25.7* 20.4* 20.0* 23.4*  MCV 96.3 95.8 95.2  95.9  PLT 243 252 258 246   . [START ON 10/11/2013] sodium chloride   Intravenous Once  . acetaminophen  500 mg Oral TID  . alteplase  2 mg Intracatheter Once  . darbepoetin (ARANESP) injection - DIALYSIS  100 mcg Intravenous Q Wed-HD  . doxercalciferol  8 mcg Intravenous Q M,W,F-HD  . famotidine  20 mg Oral QHS  . feeding supplement (NEPRO CARB STEADY)  237 mL Oral QPC supper  . insulin aspart  0-9 Units Subcutaneous TID WC  . midodrine  5 mg Oral Q M,W,F-HD  . multivitamin  1 tablet Oral QHS  . pantoprazole (PROTONIX) IV  40 mg Intravenous Q12H  . sevelamer carbonate  1,600 mg Oral TID WC   . sodium chloride Stopped (10/04/13 1600)   sodium chloride, acetaminophen, aluminum hydroxide, bisacodyl, diphenhydrAMINE, guaiFENesin-dextromethorphan, hydrALAZINE, ondansetron (ZOFRAN) IV, prochlorperazine, prochlorperazine, prochlorperazine, RESOURCE THICKENUP CLEAR,  sodium chloride, traMADol

## 2013-10-09 NOTE — Progress Notes (Signed)
Pt had an episode of vomiting up light brown emesis. Unsure the amount it was on her gown. Pt was cleaned up. Will continue to monitor. Sanda LingerMilam, Jailen Lung R, RN

## 2013-10-09 NOTE — Procedures (Signed)
I was present at this dialysis session, have reviewed the session itself and made  appropriate changes  Rob Geffrey Michaelsen MD (pgr) [phone]    (c) [phone] 10/09/2013, 1:53 PM   

## 2013-10-09 NOTE — Progress Notes (Signed)
NP Claiborne Billingsallahan notified of pt having changes on the cardiac monitoring. Pt looks to be in a junctional rhythm. EKG taken & is in the chart. No new orders given. Will continue to monitor the pt. Sanda LingerMilam, Niajah Sipos R

## 2013-10-09 NOTE — Progress Notes (Signed)
IR PA aware of patient's recent episode of hematemesis and drop in Hgb. Patient's brilinta and aspirin stopped 1/30 and she has been receiving blood transfusion. IR procedure planned for 10/11/13 is now on hold, Dr. Corliss Skainseveshwar is aware. Patient denies any additional episodes of hematemesis.  Briana BossKoreen [name] PA-C Interventional Radiology  10/09/13  11:04 AM

## 2013-10-09 NOTE — Progress Notes (Signed)
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 Readmitted for PEA arrest while on dialysis. Patient cardiac arrest etiology is not fully determined, question carotid stenosis, severe pulm HTN or severe CAD may be contributing when she is becoming hypotensivie during dialysis sessions, cardiac arrytmia. Plan is for intracranial angiogram and or possible stent 1-29 to see if that help with circulation. Not safe to discharge patient to outpatient dialysis. Appreciate Dr Arlean HoppingSchertz help.   Assessment/Plan:   -A fib: RVR with subsequent cardiac pause 6.8 sec on 1-23 after dialysis. Patient Blood pressure decrease during dialysis 1-23. HR increased. She received IV bolus. Cardiology consulted. She was on coumadin at some point but this was stop due to bleed. Difficult situation with anticoagulation. Patient converted spontaneously. She had a pause of 6.8 sec. She didn't received any medications.  No good candidate for pacer per cardio evaluation. No further evaluation per cardio.  -now sinus rhythm.  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  - Multiple times - etiology not fully determined, but suspect related to cerebral hypoperfusion during HD with volume and BP shifts. -No further evaluation per cardio.   Carotid artery stenosis - 1-39% stenosis involving the left internal carotid artery. - Findings consistent with 60-79% right ICA stenosis  - interventional radiology has been consulted, cerebral angiogram done  by Dr. Corliss Skainseveshwar.  -Severe stenosis of LT ICA pet cavernous junction of  95% , 60 % stenosis of RT ICA prox.. 50 % steosis of LT ICA petrous seg. Occluded RT VBJ -Plan for intracranial angiogram and or possible stent 1-29. -Per Dr. Corliss Skainseveshwar: resistant to plavix. Switch to Brilinta and reschedule angiogram for early next week.  Upper GI Bleed -1/30: had a episode of coffee ground emesis (aprox 1 L). -Will DC ASA and Brillinta. -Protonix IV BID. -Had EGD in 12/14 with findings of esophagitis, antral and duodenal ulcers. -Hemodynamically stable. -Stat CBC and follow q 8 hours for 24 hours. -Have discussed with Dr. Elnoria HowardHung. -Patient will receive 2 more units of PRBCs today in HD.  -Leukocytosis; Resolved  -Chest pain: resolved.  -Musculoskeletal likely due to CPR with chest wall tenderness improving:  - Placed her on tramadol as needed and Tylenol 500mg  TID.   -ESRD (end stage renal disease)  - per nephrology.  -Will hold HD 1/30 due to acute episode of GI Bleed.  -Acute Respiratory failure with hypoxia/ Pulmonary edema/ diastolic CHF  - s/p arrest  - nephrology team to manage fluid balance    Severe Pulmonary HTN with moderate TR, CAD  - cath in 2011 revealed diffuse distal disease   Right IJ thrombus  -found on a previous admission- not a candidate for anticoagulation due to PUD with bleed in 12/14 in setting of Coumadin   PUD - cont Pepcid   Hypertension  - Hydralazine  PRN. Careful with Hypotension.   Encephalopathy acute - likely due to anoxia secondary to cardiac arrest- may have new cognitive baseline-  - Levaquin started on 1/16- continue for 5 days . Finished course.  -Resolved.  Severe PAD with left BKA.   DVT Prophylaxis: SCDs  Code Status: Full code  Family Communication: Care discussed with patient.   Disposition: Transfer to telemetry.   SIGNIFICANT EVENTS / STUDIES:  1/12 cardiac arrest/ PEA at HD. 20 minutes estimated to ROSC  1/12 Echocardiogram: LVEF 50-55%. No RWMAs. Moderate LVH. Probably mild AS. Mild MR. RVSP est  69 mmHg  1/12 CT head: Age uncertain small infarct mid pons. Small infarct in this area may be recent.  1/13 Cognition much improved. Failed SBT  1/15 Extubated successfully  1/20: Cerebral angiogram done 1/30: Upper GI Bleed (coffee-ground emesis).   Subjective:  Significant episode of coffee-ground emesis today.  Objective: Weight change: -1.8 kg (-3 lb 15.5 oz)  Intake/Output Summary (Last 24 hours) at 10/09/13 1626 Last data filed at 10/09/13 1601  Gross per 24 hour  Intake    975 ml  Output      0 ml  Net    975 ml   Blood pressure 110/53, pulse 73, temperature 97.8 F (36.6 C), temperature source Oral, resp. rate 16, height 5\' 6"  (1.676 m), weight 84.5 kg (186 lb 4.6 oz), SpO2 100.00%.  Physical Exam: General: Alert and awake, oriented. CVS: S1-S2 clear Chest: CTAB Abdomen: soft nontender, nondistended, normal bowel sounds  Extremities: no cyanosis, clubbing left BKA,   Lab Results: Basic Metabolic Panel:  Recent Labs Lab 10/07/13 0007 10/08/13 0442 10/09/13 1411  NA 137  --  135*  K 3.7  --  5.2  CL 95*  --  91*  CO2 27  --  22  GLUCOSE 192*  --  209*  BUN 19  --  101*  CREATININE 3.91* 5.99* 7.79*  CALCIUM 9.5  --  9.6  PHOS  --   --  3.7   Liver Function Tests:  Recent Labs Lab 10/07/13 0007 10/09/13 1411  AST 19  --   ALT 11  --   ALKPHOS 91  --   BILITOT 0.3  --   PROT 6.5  --   ALBUMIN 2.7* 2.7*   No results found for this basename: LIPASE, AMYLASE,  in the last 168 hours No results found for this basename: AMMONIA,  in the last 168 hours CBC:  Recent Labs Lab 10/07/13 0007  10/08/13 2209 10/09/13 0420  WBC 8.6  < > 14.2* 15.0*  NEUTROABS 6.3  --   --   --   HGB 8.4*  < > 6.6* 7.8*  HCT 25.7*  < > 20.0* 23.4*  MCV 96.3  < > 95.2 95.9  PLT 243  < > 258 246  < > = values in this interval not displayed. Cardiac Enzymes: No results found for this basename: CKTOTAL, CKMB, CKMBINDEX, TROPONINI,  in the last 168 hours BNP: No  components found with this basename: POCBNP,  CBG:  Recent Labs Lab 10/08/13 1151 10/08/13 1641 10/08/13 2045 10/09/13 0746 10/09/13 1159  GLUCAP 187* 169* 147* 151* 142*     Micro Results: No results found for this or any previous visit (from the past 240 hour(s)).  Studies/Results: Ct Head Wo Contrast  09/20/2013   CLINICAL DATA:  Altered mental status ; status post cardiopulmonary resuscitation  EXAM: CT HEAD WITHOUT CONTRAST  TECHNIQUE: Contiguous axial images were obtained from the base of the skull through the vertex without intravenous contrast.  COMPARISON:  None.  FINDINGS: There is age related volume loss. There is no demonstrable mass, hemorrhage, extra-axial fluid collection, or midline shift. There is mild  small vessel disease in the centra semiovale bilaterally. There is decreased attenuation in the midline of the upper to mid pons which may represent a small recent infarct in this area. No other acute appearing infarct is appreciable.  Bony calvarium appears intact.  The mastoid air cells are clear.  IMPRESSION: Age uncertain small infarct mid pons. Small infarct in this area may be recent.  There is underlying age related volume loss with mild periventricular small vessel disease. There is no intracranial mass or hemorrhage. No gray-white compartment edema is appreciable on this study.   Electronically Signed   By: Bretta Bang M.D.   On: 09/20/2013 12:54   Dg Chest Port 1 View  09/25/2013   CLINICAL DATA:  Edema after dialysis.  EXAM: PORTABLE CHEST - 1 VIEW  COMPARISON:  One-view chest 09/24/2013.  FINDINGS: Cardiac enlargement is stable. A left IJ line is stable in position. Interstitial edema is improved. No focal airspace consolidation is evident.  IMPRESSION: 1. Improved interstitial edema. 2. Stable cardiomegaly.   Electronically Signed   By: Gennette Pac M.D.   On: 09/25/2013 21:55   Dg Chest Port 1 View  09/24/2013   CLINICAL DATA:  Respiratory failure  EXAM:  PORTABLE CHEST - 1 VIEW  COMPARISON:  09/23/2013  FINDINGS: Interval tracheal and esophageal extubation. Left IJ catheter in stable position, tip at the superior cavoatrial junction.  Unchanged cardiomegaly and pulmonary edema. These changes could obscure superimposed infection. No evidence of increased pleural fluid. No pneumothorax.  IMPRESSION: 1. Stable lung volumes after tracheal extubation. 2. Unchanged pulmonary edema, which could obscure superimposed consolidation.   Electronically Signed   By: Tiburcio Pea M.D.   On: 09/24/2013 06:23   Dg Chest Port 1 View  09/23/2013   CLINICAL DATA:  Respiratory failure.  EXAM: PORTABLE CHEST - 1 VIEW  COMPARISON:  09/22/2013.  FINDINGS: Endotracheal tube, left IJ line, NG tube in stable position. Persistent cardiomegaly and pulmonary venous congestion present. Interstitial prominence present. These findings consistent with congestive heart failure. Left base atelectasis and/or infiltrate present. Left pleural effusion has improved. No pneumothorax.  IMPRESSION: 1. Stable line and tube positions. 2. Findings consistent with congestive heart failure with pulmonary interstitial edema again noted. Left pleural effusion has improved. 3. Left lower lobe atelectasis and/or infiltrate.   Electronically Signed   By: Maisie Fus  Register   On: 09/23/2013 07:36   Dg Chest Port 1 View  09/22/2013   CLINICAL DATA:  Respiratory failure.  EXAM: PORTABLE CHEST - 1 VIEW  COMPARISON:  Chest radiograph September 21, 2013  FINDINGS: Endotracheal tube tip projects 4.2 cm above the carina. Left internal jugular central venous catheter with distal tip at the cavoatrial junction. Nasogastric tube past the gastroesophageal junction region code though, tip is not visualized. No pneumothorax.  Stable cardiomegaly, worsening interstitial edema, with minimal patchy airspace opacity in left lung base, unchanged. Suspected small left pleural effusion. Soft tissue planes and included osseous  structures are unchanged.  IMPRESSION: No apparent change in life-support lines.  Stable cardiomegaly with slightly worsening interstitial edema. Retrocardiac airspace opacity may reflect atelectasis or confluent edema with suspected small left pleural effusion.   Electronically Signed   By: Awilda Metro   On: 09/22/2013 06:31   Dg Chest Port 1 View  09/21/2013   CLINICAL DATA:  Respiratory distress.  Intubated patient.  EXAM: PORTABLE CHEST - 1 VIEW  COMPARISON:  09/20/2013  FINDINGS: Endotracheal tube tip lies 3 cm above the carina. Nasogastric tube passes  below the diaphragm well into the stomach. Left internal jugular central venous line tip lies the caval atrial junction.  Mild hazy airspace opacity is noted in the medial right lung base. This may reflect atelectasis. Infiltrate is possible. Lungs are otherwise clear. No pleural effusion or pneumothorax  IMPRESSION: 1. Support apparatus is stable in well positioned. 2. Mild hazy opacity in the medial right lung base, likely atelectasis. This potentially could reflect a small area of infiltrate or even asymmetric edema. Lungs are otherwise clear. Findings are similar to the prior exam allowing for differences in patient positioning and technique.   Electronically Signed   By: Amie Portland M.D.   On: 09/21/2013 12:27   Dg Chest Port 1 View  09/20/2013   CLINICAL DATA:  Post IJ placement  EXAM: PORTABLE CHEST - 1 VIEW  COMPARISON:  Prior chest x-ray 09/20/2013 at 9:19 a.m.  FINDINGS: Interval placement of a left IJ approach central venous catheter. The tip of the catheter is in good position at the superior cavoatrial junction. No evidence of complicating pneumothorax. The patient remains intubated. The tip of the endotracheal tube is 4 cm above the carina. A nasogastric tube is present, the tip lies off the field of view presumably within the stomach. External defibrillator pads and metallic artifacts overlie the chest. Stable cardiomegaly, very low  inspiratory volumes and mild pulmonary edema.  IMPRESSION: 1. New left IJ approach central venous catheter with the tip at the superior cavoatrial junction. No evidence of pneumothorax or other complication. 2. Otherwise, no significant interval change in the appearance of the chest.   Electronically Signed   By: Malachy Moan M.D.   On: 09/20/2013 11:48   Dg Chest Portable 1 View  09/20/2013   CLINICAL DATA:  Status post intubation.  EXAM: PORTABLE CHEST - 1 VIEW  COMPARISON:  None.  FINDINGS: 0919 hrs. Endotracheal tube tip is 3.8 cm above the base of the carina. The NG tube passes into the stomach although the distal tip position is not included on the film. Lung volumes are low. The cardio pericardial silhouette is enlarged. Diffuse interstitial opacity is associated with vascular congestion. Telemetry leads overlie the chest. No evidence for pneumothorax or substantial pleural effusion.  IMPRESSION: Low volume film with enlargement of the cardiopericardial silhouette. Vascular congestion with interstitial opacities suggest associated edema.   Electronically Signed   By: Kennith Center M.D.   On: 09/20/2013 09:30    Medications: Scheduled Meds: . [START ON 10/11/2013] sodium chloride   Intravenous Once  . acetaminophen  500 mg Oral TID  . alteplase  2 mg Intracatheter Once  . [START ON 10/13/2013] darbepoetin (ARANESP) injection - DIALYSIS  200 mcg Intravenous Q Wed-HD  . doxercalciferol  8 mcg Intravenous Q M,W,F-HD  . famotidine  20 mg Oral QHS  . feeding supplement (NEPRO CARB STEADY)  237 mL Oral QPC supper  . insulin aspart  0-9 Units Subcutaneous TID WC  . midodrine  5 mg Oral Q M,W,F-HD  . multivitamin  1 tablet Oral QHS  . pantoprazole (PROTONIX) IV  40 mg Intravenous Q12H  . sevelamer carbonate  1,600 mg Oral TID WC   Time Spent: 45 minutes   LOS: 19 days   Chaya Jan M.D. [facility] 10/09/2013, 4:26 PM Pager: [phone]  If 7PM-7AM, please contact  night-coverage [email or web address] Password [email or web address]

## 2013-10-10 LAB — CBC
HCT: 25.5 % — ABNORMAL LOW (ref 36.0–46.0)
HEMOGLOBIN: 8.6 g/dL — AB (ref 12.0–15.0)
MCH: 31.5 pg (ref 26.0–34.0)
MCHC: 33.7 g/dL (ref 30.0–36.0)
MCV: 93.4 fL (ref 78.0–100.0)
Platelets: 201 10*3/uL (ref 150–400)
RBC: 2.73 MIL/uL — AB (ref 3.87–5.11)
RDW: 18.9 % — ABNORMAL HIGH (ref 11.5–15.5)
WBC: 10.7 10*3/uL — AB (ref 4.0–10.5)

## 2013-10-10 LAB — GLUCOSE, CAPILLARY
GLUCOSE-CAPILLARY: 138 mg/dL — AB (ref 70–99)
GLUCOSE-CAPILLARY: 145 mg/dL — AB (ref 70–99)
Glucose-Capillary: 161 mg/dL — ABNORMAL HIGH (ref 70–99)
Glucose-Capillary: 186 mg/dL — ABNORMAL HIGH (ref 70–99)

## 2013-10-10 LAB — TYPE AND SCREEN
ABO/RH(D): O POS
ANTIBODY SCREEN: NEGATIVE
UNIT DIVISION: 0
Unit division: 0
Unit division: 0

## 2013-10-10 MED ORDER — DOCUSATE SODIUM 100 MG PO CAPS
100.0000 mg | ORAL_CAPSULE | Freq: Two times a day (BID) | ORAL | Status: DC
  Administered 2013-10-10 – 2013-10-15 (×9): 100 mg via ORAL
  Filled 2013-10-10 (×13): qty 1

## 2013-10-10 NOTE — Progress Notes (Signed)
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 Readmitted for PEA arrest while on dialysis. Patient cardiac arrest etiology is not fully determined, question carotid stenosis, severe pulm HTN or severe CAD may be contributing when she is becoming hypotensivie during dialysis sessions, cardiac arrytmia. Plan is for intracranial angiogram and or possible stent 1-29 to see if that help with circulation. Not safe to discharge patient to outpatient dialysis. Appreciate Dr Arlean HoppingSchertz help.   Assessment/Plan:   -A fib: RVR with subsequent cardiac pause 6.8 sec on 1-23 after dialysis. Patient Blood pressure decrease during dialysis 1-23. HR increased. She received IV bolus. Cardiology consulted. She was on coumadin at some point but this was stop due to bleed. Difficult situation with anticoagulation. Patient converted spontaneously. She had a pause of 6.8 sec. She didn't received any medications.  No good candidate for pacer per cardio evaluation. No further evaluation per cardio.  -now sinus rhythm.  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  - Multiple times - etiology not fully determined, but suspect related to cerebral hypoperfusion during HD with volume and BP shifts. -No further evaluation per cardio.   Carotid artery stenosis - 1-39% stenosis involving the left internal carotid artery. - Findings consistent with 60-79% right ICA stenosis  - interventional radiology has been consulted, cerebral angiogram done  by Dr. Corliss Skainseveshwar.  -Severe stenosis of LT ICA pet cavernous junction of  95% , 60 % stenosis of RT ICA prox.. 50 % steosis of LT ICA petrous seg. Occluded RT VBJ -Plan for intracranial angiogram and or possible stent 1-29. -Per Dr. Corliss Skainseveshwar: resistant to plavix. Switch to Brilinta and reschedule angiogram for early next week.  Upper GI Bleed -1/30: had a episode of coffee ground emesis (aprox 1 L). -Will DC ASA and Brillinta. -Protonix IV BID. -Had EGD in 12/14 with findings of esophagitis, antral and duodenal ulcers. -Hemodynamically stable. -Stat CBC and follow q 8 hours for 24 hours. -Have discussed with Dr. Elnoria HowardHung. -Patient will receive 2 more units of PRBCs today in HD.  -Leukocytosis; Resolved  -Chest pain: resolved.  -Musculoskeletal likely due to CPR with chest wall tenderness improving:  - Placed her on tramadol as needed and Tylenol 500mg  TID.   -ESRD (end stage renal disease)  - per nephrology.  -Will hold HD 1/30 due to acute episode of GI Bleed.  -Acute Respiratory failure with hypoxia/ Pulmonary edema/ diastolic CHF  - s/p arrest  - nephrology team to manage fluid balance    Severe Pulmonary HTN with moderate TR, CAD  - cath in 2011 revealed diffuse distal disease   Right IJ thrombus  -found on a previous admission- not a candidate for anticoagulation due to PUD with bleed in 12/14 in setting of Coumadin   PUD - cont Pepcid   Hypertension  - Hydralazine  PRN. Careful with Hypotension.   Encephalopathy acute - likely due to anoxia secondary to cardiac arrest- may have new cognitive baseline-  - Levaquin started on 1/16- continue for 5 days . Finished course.  -Resolved.  Severe PAD with left BKA.   DVT Prophylaxis: SCDs  Code Status: Full code  Family Communication: Care discussed with patient.   Disposition: Transfer to telemetry.   SIGNIFICANT EVENTS / STUDIES:  1/12 cardiac arrest/ PEA at HD. 20 minutes estimated to ROSC  1/12 Echocardiogram: LVEF 50-55%. No RWMAs. Moderate LVH. Probably mild AS. Mild MR. RVSP est  69 mmHg  1/12 CT head: Age uncertain small infarct mid pons. Small infarct in this area may be recent.  1/13 Cognition much improved. Failed SBT  1/15 Extubated successfully  1/20: Cerebral angiogram done 1/30: Upper GI Bleed (coffee-ground emesis).   Subjective:  Significant episode of coffee-ground emesis today.  Objective: Weight change: 3 kg (6 lb 9.8 oz)  Intake/Output Summary (Last 24 hours) at 10/10/13 1430 Last data filed at 10/10/13 1300  Gross per 24 hour  Intake   1165 ml  Output      1 ml  Net   1164 ml   Blood pressure 128/58, pulse 76, temperature 98.3 F (36.8 C), temperature source Oral, resp. rate 18, height 5\' 6"  (1.676 m), weight 80.8 kg (178 lb 2.1 oz), SpO2 100.00%.  Physical Exam: General: Alert and awake, oriented. CVS: S1-S2 clear Chest: CTAB Abdomen: soft nontender, nondistended, normal bowel sounds  Extremities: no cyanosis, clubbing left BKA,   Lab Results: Basic Metabolic Panel:  Recent Labs Lab 10/07/13 0007 10/08/13 0442 10/09/13 1411  NA 137  --  135*  K 3.7  --  5.2  CL 95*  --  91*  CO2 27  --  22  GLUCOSE 192*  --  209*  BUN 19  --  101*  CREATININE 3.91* 5.99* 7.79*  CALCIUM 9.5  --  9.6  PHOS  --   --  3.7   Liver Function Tests:  Recent Labs Lab 10/07/13 0007 10/09/13 1411  AST 19  --   ALT 11  --   ALKPHOS 91  --   BILITOT 0.3  --   PROT 6.5  --   ALBUMIN 2.7* 2.7*   No results found for this basename: LIPASE, AMYLASE,  in the last 168 hours No results found for this basename: AMMONIA,  in the last 168 hours CBC:  Recent Labs Lab 10/07/13 0007  10/09/13 0420 10/10/13 0530  WBC 8.6  < > 15.0* 10.7*  NEUTROABS 6.3  --   --   --   HGB 8.4*  < > 7.8* 8.6*  HCT 25.7*  < > 23.4* 25.5*  MCV 96.3  < > 95.9 93.4  PLT 243  < > 246 201  < > = values in this interval not displayed. Cardiac Enzymes: No results found for this basename: CKTOTAL, CKMB, CKMBINDEX, TROPONINI,  in the last 168 hours BNP: No components  found with this basename: POCBNP,  CBG:  Recent Labs Lab 10/09/13 0746 10/09/13 1159 10/09/13 2026 10/10/13 0805 10/10/13 1145  GLUCAP 151* 142* 117* 138* 145*     Micro Results: No results found for this or any previous visit (from the past 240 hour(s)).  Studies/Results: Ct Head Wo Contrast  09/20/2013   CLINICAL DATA:  Altered mental status ; status post cardiopulmonary resuscitation  EXAM: CT HEAD WITHOUT CONTRAST  TECHNIQUE: Contiguous axial images were obtained from the base of the skull through the vertex without intravenous contrast.  COMPARISON:  None.  FINDINGS: There is age related volume loss. There is no demonstrable mass, hemorrhage, extra-axial fluid collection, or midline shift. There is mild small vessel  disease in the centra semiovale bilaterally. There is decreased attenuation in the midline of the upper to mid pons which may represent a small recent infarct in this area. No other acute appearing infarct is appreciable.  Bony calvarium appears intact.  The mastoid air cells are clear.  IMPRESSION: Age uncertain small infarct mid pons. Small infarct in this area may be recent.  There is underlying age related volume loss with mild periventricular small vessel disease. There is no intracranial mass or hemorrhage. No gray-white compartment edema is appreciable on this study.   Electronically Signed   By: Bretta Bang M.D.   On: 09/20/2013 12:54   Dg Chest Port 1 View  09/25/2013   CLINICAL DATA:  Edema after dialysis.  EXAM: PORTABLE CHEST - 1 VIEW  COMPARISON:  One-view chest 09/24/2013.  FINDINGS: Cardiac enlargement is stable. A left IJ line is stable in position. Interstitial edema is improved. No focal airspace consolidation is evident.  IMPRESSION: 1. Improved interstitial edema. 2. Stable cardiomegaly.   Electronically Signed   By: Gennette Pac M.D.   On: 09/25/2013 21:55   Dg Chest Port 1 View  09/24/2013   CLINICAL DATA:  Respiratory failure  EXAM: PORTABLE  CHEST - 1 VIEW  COMPARISON:  09/23/2013  FINDINGS: Interval tracheal and esophageal extubation. Left IJ catheter in stable position, tip at the superior cavoatrial junction.  Unchanged cardiomegaly and pulmonary edema. These changes could obscure superimposed infection. No evidence of increased pleural fluid. No pneumothorax.  IMPRESSION: 1. Stable lung volumes after tracheal extubation. 2. Unchanged pulmonary edema, which could obscure superimposed consolidation.   Electronically Signed   By: Tiburcio Pea M.D.   On: 09/24/2013 06:23   Dg Chest Port 1 View  09/23/2013   CLINICAL DATA:  Respiratory failure.  EXAM: PORTABLE CHEST - 1 VIEW  COMPARISON:  09/22/2013.  FINDINGS: Endotracheal tube, left IJ line, NG tube in stable position. Persistent cardiomegaly and pulmonary venous congestion present. Interstitial prominence present. These findings consistent with congestive heart failure. Left base atelectasis and/or infiltrate present. Left pleural effusion has improved. No pneumothorax.  IMPRESSION: 1. Stable line and tube positions. 2. Findings consistent with congestive heart failure with pulmonary interstitial edema again noted. Left pleural effusion has improved. 3. Left lower lobe atelectasis and/or infiltrate.   Electronically Signed   By: Maisie Fus  Register   On: 09/23/2013 07:36   Dg Chest Port 1 View  09/22/2013   CLINICAL DATA:  Respiratory failure.  EXAM: PORTABLE CHEST - 1 VIEW  COMPARISON:  Chest radiograph September 21, 2013  FINDINGS: Endotracheal tube tip projects 4.2 cm above the carina. Left internal jugular central venous catheter with distal tip at the cavoatrial junction. Nasogastric tube past the gastroesophageal junction region code though, tip is not visualized. No pneumothorax.  Stable cardiomegaly, worsening interstitial edema, with minimal patchy airspace opacity in left lung base, unchanged. Suspected small left pleural effusion. Soft tissue planes and included osseous structures are  unchanged.  IMPRESSION: No apparent change in life-support lines.  Stable cardiomegaly with slightly worsening interstitial edema. Retrocardiac airspace opacity may reflect atelectasis or confluent edema with suspected small left pleural effusion.   Electronically Signed   By: Awilda Metro   On: 09/22/2013 06:31   Dg Chest Port 1 View  09/21/2013   CLINICAL DATA:  Respiratory distress.  Intubated patient.  EXAM: PORTABLE CHEST - 1 VIEW  COMPARISON:  09/20/2013  FINDINGS: Endotracheal tube tip lies 3 cm above the carina. Nasogastric tube passes below the  diaphragm well into the stomach. Left internal jugular central venous line tip lies the caval atrial junction.  Mild hazy airspace opacity is noted in the medial right lung base. This may reflect atelectasis. Infiltrate is possible. Lungs are otherwise clear. No pleural effusion or pneumothorax  IMPRESSION: 1. Support apparatus is stable in well positioned. 2. Mild hazy opacity in the medial right lung base, likely atelectasis. This potentially could reflect a small area of infiltrate or even asymmetric edema. Lungs are otherwise clear. Findings are similar to the prior exam allowing for differences in patient positioning and technique.   Electronically Signed   By: Amie Portland M.D.   On: 09/21/2013 12:27   Dg Chest Port 1 View  09/20/2013   CLINICAL DATA:  Post IJ placement  EXAM: PORTABLE CHEST - 1 VIEW  COMPARISON:  Prior chest x-ray 09/20/2013 at 9:19 a.m.  FINDINGS: Interval placement of a left IJ approach central venous catheter. The tip of the catheter is in good position at the superior cavoatrial junction. No evidence of complicating pneumothorax. The patient remains intubated. The tip of the endotracheal tube is 4 cm above the carina. A nasogastric tube is present, the tip lies off the field of view presumably within the stomach. External defibrillator pads and metallic artifacts overlie the chest. Stable cardiomegaly, very low inspiratory  volumes and mild pulmonary edema.  IMPRESSION: 1. New left IJ approach central venous catheter with the tip at the superior cavoatrial junction. No evidence of pneumothorax or other complication. 2. Otherwise, no significant interval change in the appearance of the chest.   Electronically Signed   By: Malachy Moan M.D.   On: 09/20/2013 11:48   Dg Chest Portable 1 View  09/20/2013   CLINICAL DATA:  Status post intubation.  EXAM: PORTABLE CHEST - 1 VIEW  COMPARISON:  None.  FINDINGS: 0919 hrs. Endotracheal tube tip is 3.8 cm above the base of the carina. The NG tube passes into the stomach although the distal tip position is not included on the film. Lung volumes are low. The cardio pericardial silhouette is enlarged. Diffuse interstitial opacity is associated with vascular congestion. Telemetry leads overlie the chest. No evidence for pneumothorax or substantial pleural effusion.  IMPRESSION: Low volume film with enlargement of the cardiopericardial silhouette. Vascular congestion with interstitial opacities suggest associated edema.   Electronically Signed   By: Kennith Center M.D.   On: 09/20/2013 09:30    Medications: Scheduled Meds: . [START ON 10/11/2013] sodium chloride   Intravenous Once  . acetaminophen  500 mg Oral TID  . alteplase  2 mg Intracatheter Once  . [START ON 10/13/2013] darbepoetin (ARANESP) injection - DIALYSIS  200 mcg Intravenous Q Wed-HD  . docusate sodium  100 mg Oral BID  . doxercalciferol  8 mcg Intravenous Q M,W,F-HD  . famotidine  20 mg Oral QHS  . feeding supplement (NEPRO CARB STEADY)  237 mL Oral QPC supper  . insulin aspart  0-9 Units Subcutaneous TID WC  . midodrine  5 mg Oral Q M,W,F-HD  . multivitamin  1 tablet Oral QHS  . pantoprazole (PROTONIX) IV  40 mg Intravenous Q12H  . sevelamer carbonate  1,600 mg Oral TID WC   Time Spent: 45 minutes   LOS: 20 days   Chaya Jan M.D. [facility] 10/10/2013, 2:30 PM Pager: [phone]  If 7PM-7AM,  please contact night-coverage [email or web address] Password [email or web address]

## 2013-10-10 NOTE — Progress Notes (Signed)
Subjective: No acute events.  Feeling well.  Objective: Vital signs in last 24 hours: Temp:  [97.8 F (36.6 C)-98.3 F (36.8 C)] 98.3 F (36.8 C) (02/01 0514) Pulse Rate:  [68-79] 68 (02/01 0514) Resp:  [16-22] 16 (02/01 0514) BP: (94-135)/(47-98) 123/47 mmHg (02/01 0514) SpO2:  [97 %-100 %] 97 % (02/01 0514) Weight:  [186 lb 4.6 oz (84.5 kg)-187 lb 9.8 oz (85.1 kg)] 187 lb 9.8 oz (85.1 kg) (02/01 0514) Last BM Date: 10/08/13  Intake/Output from previous day: 01/31 0701 - 02/01 0700 In: 625 [P.O.:300; Blood:325] Out: 0  Intake/Output this shift:    General appearance: alert and no distress GI: soft, non-tender; bowel sounds normal; no masses,  no organomegaly  Lab Results:  Recent Labs  10/08/13 2209 10/09/13 0420 10/10/13 0530  WBC 14.2* 15.0* 10.7*  HGB 6.6* 7.8* 8.6*  HCT 20.0* 23.4* 25.5*  PLT 258 246 201   BMET  Recent Labs  10/08/13 0442 10/09/13 1411  NA  --  135*  K  --  5.2  CL  --  91*  CO2  --  22  GLUCOSE  --  209*  BUN  --  101*  CREATININE 5.99* 7.79*  CALCIUM  --  9.6   LFT  Recent Labs  10/09/13 1411  ALBUMIN 2.7*   PT/INR No results found for this basename: LABPROT, INR,  in the last 72 hours Hepatitis Panel No results found for this basename: HEPBSAG, HCVAB, HEPAIGM, HEPBIGM,  in the last 72 hours C-Diff No results found for this basename: CDIFFTOX,  in the last 72 hours Fecal Lactopherrin No results found for this basename: FECLLACTOFRN,  in the last 72 hours  Studies/Results: No results found.  Medications:  Scheduled: . [START ON 10/11/2013] sodium chloride   Intravenous Once  . acetaminophen  500 mg Oral TID  . alteplase  2 mg Intracatheter Once  . [START ON 10/13/2013] darbepoetin (ARANESP) injection - DIALYSIS  200 mcg Intravenous Q Wed-HD  . doxercalciferol  8 mcg Intravenous Q M,W,F-HD  . famotidine  20 mg Oral QHS  . feeding supplement (NEPRO CARB STEADY)  237 mL Oral QPC supper  . insulin aspart  0-9 Units  Subcutaneous TID WC  . midodrine  5 mg Oral Q M,W,F-HD  . multivitamin  1 tablet Oral QHS  . pantoprazole (PROTONIX) IV  40 mg Intravenous Q12H  . sevelamer carbonate  1,600 mg Oral TID WC   Continuous: . sodium chloride Stopped (10/04/13 1600)    Assessment/Plan: 1) Hematemesis. 2) History of recent esophagitis and PUD.   She is stable.  Again, this is a very hard situation.  Her bleeding is not a surprise to me, but she was not on PPIs.  It may be reasonable to treat her with Protonix for 7 days and then try anticoagulation again.  If rebleeding occurs, she cannot use that type of medication.   Plan: 1) Continue to hold anticoagulation. 2) Follow HGB and transfuse as necessary.   LOS: 20 days   Briana Gould 10/10/2013, 8:29 AM

## 2013-10-10 NOTE — Progress Notes (Signed)
[facility] Progress Note   Subjective: Feeling better today, BP up 125/75. HD yest, no fluid off. Hb 8.6 today, weights are bed weights and inaccurate. Weighed standing 80.8kg subtracting for prosthesis.   Filed Vitals:   10/09/13 1700 10/09/13 1736 10/09/13 2020 10/10/13 0514  BP: 118/77 120/98 109/49 123/47  Pulse: 75 78 71 68  Temp:   98.2 F (36.8 C) 98.3 F (36.8 C)  TempSrc:   Oral Oral  Resp: 16 16 16 16   Height:      Weight:    85.1 kg (187 lb 9.8 oz)  SpO2:   100% 97%  Exam  Alert, no distress, sitting up in bed Clear bilat, no rales or wheezes RRR no MRG Abd +BS, soft and nontender  2+ pitting edema RLE, BKA on L  LUA AV fistula patent  Dialysis: MWF [place]  4.5h (4 hr here) 79kg BFR 250 (just started buttonhole to AVF) Profile 2 F180 No heparin  Hectorol 8 Epo 20K Venofer none  Assessment: 1 Hematemesis, resolved 2 Anemia, s/p prbc's, Hb 8.6 3 Recurrent cardiac arrest on dialysis 4 Severe ASCVD 5 ESRD on HD 6 HTN, permissive mild vol excess 7 2HPTH 9 CAD diffuse small vessel dz per cath 2011  Plan- difficult problem, recurrent resp > cardiac arrest on dialysis thought to be related to severe ASCVD causing hypoperfusion with BP shifts during the HD procedure. IR was going to attempt carotid procedure of L ICA stenosis but bleeding problems for now have put this on hold. I have asked Gen Surg to see pt about placement of PD cath, understanding that gen anesthesia would have it's own risks given the situation.  PD generaly allows for a more stable BP overall during the dialysis process.  However, prognosis long-term is still poor with the severity of vascular disease she has.  Have d/w pt and daughter.  Plan HD tomorrow.    Vinson Moselle MD pager [phone]    cell [phone] 10/10/2013, 11:17 AM   Recent Labs Lab 10/04/13 0728 10/06/13 1030 10/07/13 0007 10/08/13 0442 10/09/13 1411  NA 133* 137 137  --  135*  K 4.5 4.4 3.7  --  5.2  CL  92* 95* 95*  --  91*  CO2 23 26 27   --  22  GLUCOSE 135* 202* 192*  --  209*  BUN 39* 36* 19  --  101*  CREATININE 6.84* 6.78* 3.91* 5.99* 7.79*  CALCIUM 9.6 9.8 9.5  --  9.6  PHOS 4.0 4.0  --   --  3.7    Recent Labs Lab 10/06/13 1030 10/07/13 0007 10/09/13 1411  AST  --  19  --   ALT  --  11  --   ALKPHOS  --  91  --   BILITOT  --  0.3  --   PROT  --  6.5  --   ALBUMIN 2.8* 2.7* 2.7*    Recent Labs Lab 10/07/13 0007  10/08/13 2209 10/09/13 0420 10/10/13 0530  WBC 8.6  < > 14.2* 15.0* 10.7*  NEUTROABS 6.3  --   --   --   --   HGB 8.4*  < > 6.6* 7.8* 8.6*  HCT 25.7*  < > 20.0* 23.4* 25.5*  MCV 96.3  < > 95.2 95.9 93.4  PLT 243  < > 258 246 201  < > = values in this interval not displayed. Melene Muller ON 10/11/2013] sodium chloride   Intravenous Once  . acetaminophen  500 mg Oral TID  . alteplase  2 mg Intracatheter Once  . [START ON 10/13/2013] darbepoetin (ARANESP) injection - DIALYSIS  200 mcg Intravenous Q Wed-HD  . docusate sodium  100 mg Oral BID  . doxercalciferol  8 mcg Intravenous Q M,W,F-HD  . famotidine  20 mg Oral QHS  . feeding supplement (NEPRO CARB STEADY)  237 mL Oral QPC supper  . insulin aspart  0-9 Units Subcutaneous TID WC  . midodrine  5 mg Oral Q M,W,F-HD  . multivitamin  1 tablet Oral QHS  . pantoprazole (PROTONIX) IV  40 mg Intravenous Q12H  . sevelamer carbonate  1,600 mg Oral TID WC   . sodium chloride Stopped (10/04/13 1600)   sodium chloride, acetaminophen, aluminum hydroxide, bisacodyl, diphenhydrAMINE, guaiFENesin-dextromethorphan, ondansetron (ZOFRAN) IV, prochlorperazine, prochlorperazine, prochlorperazine, RESOURCE THICKENUP CLEAR, sodium chloride, traMADol

## 2013-10-11 ENCOUNTER — Encounter (HOSPITAL_COMMUNITY)
Admission: EM | Disposition: A | Discharge status: Rehabilitation Facility | Payer: Self-pay | Source: Home / Self Care | Attending: Internal Medicine

## 2013-10-11 ENCOUNTER — Encounter (HOSPITAL_COMMUNITY): Payer: Self-pay | Admitting: General Surgery

## 2013-10-11 ENCOUNTER — Inpatient Hospital Stay (HOSPITAL_COMMUNITY): Admit: 2013-10-11 | Payer: BC Managed Care – PPO

## 2013-10-11 DIAGNOSIS — N186 End stage renal disease: Secondary | ICD-10-CM

## 2013-10-11 DIAGNOSIS — Z01810 Encounter for preprocedural cardiovascular examination: Secondary | ICD-10-CM

## 2013-10-11 LAB — RENAL FUNCTION PANEL
Albumin: 3 g/dL — ABNORMAL LOW (ref 3.5–5.2)
BUN: 45 mg/dL — AB (ref 6–23)
CO2: 25 mEq/L (ref 19–32)
Calcium: 9.7 mg/dL (ref 8.4–10.5)
Chloride: 94 mEq/L — ABNORMAL LOW (ref 96–112)
Creatinine, Ser: 6.24 mg/dL — ABNORMAL HIGH (ref 0.50–1.10)
GFR calc Af Amer: 7 mL/min — ABNORMAL LOW (ref >90)
GFR calc non Af Amer: 6 mL/min — ABNORMAL LOW (ref >90)
Glucose, Bld: 160 mg/dL — ABNORMAL HIGH (ref 70–99)
PHOSPHORUS: 3.8 mg/dL (ref 2.3–4.6)
Potassium: 5.3 mEq/L (ref 3.7–5.3)
Sodium: 136 mEq/L — ABNORMAL LOW (ref 137–147)

## 2013-10-11 LAB — CBC
HEMATOCRIT: 27.5 % — AB (ref 36.0–46.0)
Hemoglobin: 8.9 g/dL — ABNORMAL LOW (ref 12.0–15.0)
MCH: 31 pg (ref 26.0–34.0)
MCHC: 32.4 g/dL (ref 30.0–36.0)
MCV: 95.8 fL (ref 78.0–100.0)
Platelets: 230 10*3/uL (ref 150–400)
RBC: 2.87 MIL/uL — ABNORMAL LOW (ref 3.87–5.11)
RDW: 18.9 % — AB (ref 11.5–15.5)
WBC: 11.4 10*3/uL — ABNORMAL HIGH (ref 4.0–10.5)

## 2013-10-11 LAB — GLUCOSE, CAPILLARY
GLUCOSE-CAPILLARY: 185 mg/dL — AB (ref 70–99)
Glucose-Capillary: 143 mg/dL — ABNORMAL HIGH (ref 70–99)
Glucose-Capillary: 268 mg/dL — ABNORMAL HIGH (ref 70–99)

## 2013-10-11 SURGERY — RADIOLOGY WITH ANESTHESIA
Anesthesia: Monitor Anesthesia Care

## 2013-10-11 SURGERY — Surgical Case
Anesthesia: *Unknown

## 2013-10-11 MED ORDER — DOXERCALCIFEROL 4 MCG/2ML IV SOLN
INTRAVENOUS | Status: AC
  Administered 2013-10-11: 4 ug via INTRAVENOUS
  Filled 2013-10-11: qty 2

## 2013-10-11 MED ORDER — DOXERCALCIFEROL 4 MCG/2ML IV SOLN
INTRAVENOUS | Status: AC
  Administered 2013-10-11: 4 ug
  Filled 2013-10-11: qty 2

## 2013-10-11 NOTE — Consult Note (Signed)
Would not put her under general anesthesia in light of arrests on hd, carotid disease.

## 2013-10-11 NOTE — Progress Notes (Signed)
[facility] ROUNDING NOTE   Subjective:   Interval History:   Objective:  Vital signs in last 24 hours:  Temp:  [97.7 F (36.5 C)-98.3 F (36.8 C)] 97.7 F (36.5 C) (02/02 0523) Pulse Rate:  [73-82] 73 (02/02 0523) Resp:  [15-18] 15 (02/02 0523) BP: (128-157)/(58-84) 149/84 mmHg (02/02 0523) SpO2:  [100 %] 100 % (02/02 0523) Weight:  [80.8 kg (178 lb 2.1 oz)] 80.8 kg (178 lb 2.1 oz) (02/01 1132)  Weight change: -3.7 kg (-8 lb 2.5 oz) Filed Weights   10/09/13 1407 10/10/13 0514 10/10/13 1132  Weight: 84.5 kg (186 lb 4.6 oz) 85.1 kg (187 lb 9.8 oz) 80.8 kg (178 lb 2.1 oz)    Intake/Output: I/O last 3 completed shifts: In: 1120 [P.O.:1120] Out: 3 [Urine:1; Stool:2]   Intake/Output this shift:     CVS- RRR RS- CTA ABD- BS present soft non-distended EXT- no edema   Basic Metabolic Panel:  Recent Labs Lab 10/06/13 1030 10/07/13 0007 10/08/13 0442 10/09/13 1411  NA 137 137  --  135*  K 4.4 3.7  --  5.2  CL 95* 95*  --  91*  CO2 26 27  --  22  GLUCOSE 202* 192*  --  209*  BUN 36* 19  --  101*  CREATININE 6.78* 3.91* 5.99* 7.79*  CALCIUM 9.8 9.5  --  9.6  PHOS 4.0  --   --  3.7    Liver Function Tests:  Recent Labs Lab 10/06/13 1030 10/07/13 0007 10/09/13 1411  AST  --  19  --   ALT  --  11  --   ALKPHOS  --  91  --   BILITOT  --  0.3  --   PROT  --  6.5  --   ALBUMIN 2.8* 2.7* 2.7*   No results found for this basename: LIPASE, AMYLASE,  in the last 168 hours No results found for this basename: AMMONIA,  in the last 168 hours  CBC:  Recent Labs Lab 10/07/13 0007 10/08/13 1846 10/08/13 2209 10/09/13 0420 10/10/13 0530  WBC 8.6 12.5* 14.2* 15.0* 10.7*  NEUTROABS 6.3  --   --   --   --   HGB 8.4* 6.6* 6.6* 7.8* 8.6*  HCT 25.7* 20.4* 20.0* 23.4* 25.5*  MCV 96.3 95.8 95.2 95.9 93.4  PLT 243 252 258 246 201    Cardiac Enzymes: No results found for this basename: CKTOTAL, CKMB, CKMBINDEX, TROPONINI,  in the last 168  hours  BNP: No components found with this basename: POCBNP,   CBG:  Recent Labs Lab 10/10/13 0805 10/10/13 1145 10/10/13 1658 10/10/13 2048 10/11/13 0741  GLUCAP 138* 145* 161* 186* 143*    Microbiology: Results for orders placed during the hospital encounter of 09/20/13  MRSA PCR SCREENING     Status: None   Collection Time    09/20/13  1:06 PM      Result Value Range Status   MRSA by PCR NEGATIVE  NEGATIVE Final   Comment:            The GeneXpert MRSA Assay (FDA     approved for NASAL specimens     only), is one component of a     comprehensive MRSA colonization     surveillance program. It is not     intended to diagnose MRSA     infection nor to guide or     monitor treatment for     MRSA infections.  Coagulation Studies: No results found for this basename: LABPROT, INR,  in the last 72 hours  Urinalysis: No results found for this basename: COLORURINE, APPERANCEUR, LABSPEC, PHURINE, GLUCOSEU, HGBUR, BILIRUBINUR, KETONESUR, PROTEINUR, UROBILINOGEN, NITRITE, LEUKOCYTESUR,  in the last 72 hours    Imaging: No results found.   Medications:   . sodium chloride Stopped (10/04/13 1600)   . acetaminophen  500 mg Oral TID  . alteplase  2 mg Intracatheter Once  . [START ON 10/13/2013] darbepoetin (ARANESP) injection - DIALYSIS  200 mcg Intravenous Q Wed-HD  . docusate sodium  100 mg Oral BID  . doxercalciferol  8 mcg Intravenous Q M,W,F-HD  . famotidine  20 mg Oral QHS  . feeding supplement (NEPRO CARB STEADY)  237 mL Oral QPC supper  . insulin aspart  0-9 Units Subcutaneous TID WC  . midodrine  5 mg Oral Q M,W,F-HD  . multivitamin  1 tablet Oral QHS  . pantoprazole (PROTONIX) IV  40 mg Intravenous Q12H  . 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 Readmitted for PEA arrest while on dialysis. Patient cardiac arrest etiology is not fully determined, question carotid stenosis, severe pulm HTN or severe CAD may be contributing when she is becoming hypotensivie during dialysis sessions, cardiac arrytmia. Plan is for intracranial angiogram and or possible stent 1-29 to see if that help with circulation. Not safe to discharge patient to outpatient dialysis   ESRD  Dialysis MWF  Unfortunately several cardiac arrests with Hemodialysis now exploring ideas such as PD and Next stage  HTN controlled  Midodrine support  Anemia darbepoietin 100mcg transfused   Bones Hectorol 8mcg  and renvela  Carotid disease  Brilinta and plavix  Discussed with daughter [name]  In room, she would like more information about home therapy and will meet with the home therapy team in AM tomorrow. She also expressed an interest in having Dr Lowell GuitarPowell involved  LOS: 21 Abdel Effinger W @TODAY @10 :01 AM

## 2013-10-11 NOTE — Progress Notes (Signed)
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 Readmitted for PEA arrest while on dialysis. Patient cardiac arrest etiology is not fully determined, question carotid stenosis, severe pulm HTN or severe CAD may be contributing when she is becoming hypotensivie during dialysis sessions, cardiac arrytmia. Plan is for intracranial angiogram and or possible stent 1-29 to see if that help with circulation. Not safe to discharge patient to outpatient dialysis. Appreciate Dr Arlean HoppingSchertz help.   Assessment/Plan:   -A fib: RVR with subsequent cardiac pause 6.8 sec on 1-23 after dialysis. Patient Blood pressure decrease during dialysis 1-23. HR increased. She received IV bolus. Cardiology consulted. She was on coumadin at some point but this was stop due to bleed. Difficult situation with anticoagulation. Patient converted spontaneously. She had a pause of 6.8 sec. She didn't received any medications.  No good candidate for pacer per cardio evaluation. No further evaluation per cardio.  -now sinus rhythm.  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  - Multiple times - etiology not fully determined, but suspect related to cerebral hypoperfusion during HD with volume and BP shifts. -No further evaluation per cardio.   Carotid artery stenosis - 1-39% stenosis involving the left internal carotid artery. - Findings consistent with 60-79% right ICA stenosis  - interventional radiology has been consulted, cerebral angiogram done  by Dr. Corliss Skainseveshwar.  -Severe stenosis of LT ICA pet cavernous junction of  95% , 60 % stenosis of RT ICA prox.. 50 % steosis of LT ICA petrous seg. Occluded RT VBJ -Plan for intracranial angiogram and or possible stent 1-29. -Per Dr. Corliss Skainseveshwar: resistant to plavix. Switch to Brilinta and reschedule angiogram for early next week.  Upper GI Bleed -1/30: had a episode of coffee ground emesis (aprox 1 L). -Will DC ASA and Brillinta. -Protonix IV BID. -Had EGD in 12/14 with findings of esophagitis, antral and duodenal ulcers. -Hemodynamically stable. -Stat CBC and follow q 8 hours for 24 hours. -Have discussed with Dr. Elnoria HowardHung. -Patient will receive 2 more units of PRBCs today in HD.  -Leukocytosis; Resolved  -Chest pain: resolved.  -Musculoskeletal likely due to CPR with chest wall tenderness improving:  - Placed her on tramadol as needed and Tylenol 500mg  TID.   -ESRD (end stage renal disease)  - per nephrology.  -Will hold HD 1/30 due to acute episode of GI Bleed. -Exploring alternate methods to HD: surgery has refused to place PD catheter due to risks with anesthesia.  -Acute Respiratory failure with hypoxia/ Pulmonary edema/ diastolic CHF  - s/p arrest  - nephrology team to manage fluid balance    Severe Pulmonary HTN with moderate TR, CAD  - cath in 2011 revealed diffuse distal disease   Right IJ thrombus  -found on a previous admission- not a candidate for anticoagulation due to PUD with bleed in 12/14 in setting of Coumadin   PUD - cont Pepcid   Hypertension  - Hydralazine  PRN. Careful with Hypotension.   Encephalopathy acute - likely due to anoxia secondary to cardiac arrest- may have new cognitive baseline-  -  Levaquin started on 1/16- continue for 5 days . Finished course.  -Resolved.   Severe PAD with left BKA.   DVT Prophylaxis: SCDs  Code Status: Full code  Family Communication: Care discussed with patient.   Disposition: Transfer to telemetry.   SIGNIFICANT EVENTS / STUDIES:  1/12 cardiac arrest/ PEA at HD. 20 minutes  estimated to ROSC  1/12 Echocardiogram: LVEF 50-55%. No RWMAs. Moderate LVH. Probably mild AS. Mild MR. RVSP est 69 mmHg  1/12 CT head: Age uncertain small infarct mid pons. Small infarct in this area may be recent.  1/13 Cognition much improved. Failed SBT  1/15 Extubated successfully  1/20: Cerebral angiogram done 1/30: Upper GI Bleed (coffee-ground emesis).   Subjective:  Significant episode of coffee-ground emesis today.  Objective: Weight change: -3.7 kg (-8 lb 2.5 oz)  Intake/Output Summary (Last 24 hours) at 10/11/13 1452 Last data filed at 10/11/13 1300  Gross per 24 hour  Intake   1000 ml  Output      2 ml  Net    998 ml   Blood pressure 144/78, pulse 84, temperature 97.5 F (36.4 C), temperature source Oral, resp. rate 16, height 5\' 6"  (1.676 m), weight 80.8 kg (178 lb 2.1 oz), SpO2 100.00%.  Physical Exam: General: Alert and awake, oriented. CVS: S1-S2 clear Chest: CTAB Abdomen: soft nontender, nondistended, normal bowel sounds  Extremities: no cyanosis, clubbing left BKA,   Lab Results: Basic Metabolic Panel:  Recent Labs Lab 10/09/13 1411 10/11/13 1243  NA 135* 136*  K 5.2 5.3  CL 91* 94*  CO2 22 25  GLUCOSE 209* 160*  BUN 101* 45*  CREATININE 7.79* 6.24*  CALCIUM 9.6 9.7  PHOS 3.7 3.8   Liver Function Tests:  Recent Labs Lab 10/07/13 0007 10/09/13 1411 10/11/13 1243  AST 19  --   --   ALT 11  --   --   ALKPHOS 91  --   --   BILITOT 0.3  --   --   PROT 6.5  --   --   ALBUMIN 2.7* 2.7* 3.0*   No results found for this basename: LIPASE, AMYLASE,  in the last 168 hours No results found for this basename: AMMONIA,  in the last 168 hours CBC:  Recent Labs Lab 10/07/13 0007  10/10/13 0530 10/11/13 1243  WBC 8.6  < > 10.7* 11.4*  NEUTROABS 6.3  --   --   --   HGB 8.4*  < > 8.6* 8.9*  HCT 25.7*  < > 25.5* 27.5*  MCV 96.3  < > 93.4 95.8  PLT 243  < > 201 230  < > = values in this interval not displayed. Cardiac Enzymes: No results  found for this basename: CKTOTAL, CKMB, CKMBINDEX, TROPONINI,  in the last 168 hours BNP: No components found with this basename: POCBNP,  CBG:  Recent Labs Lab 10/10/13 1145 10/10/13 1658 10/10/13 2048 10/11/13 0741 10/11/13 1140  GLUCAP 145* 161* 186* 143* 185*     Micro Results: No results found for this or any previous visit (from the past 240 hour(s)).  Studies/Results: Ct Head Wo Contrast  09/20/2013   CLINICAL DATA:  Altered mental status ; status post cardiopulmonary resuscitation  EXAM: CT HEAD WITHOUT CONTRAST  TECHNIQUE: Contiguous axial images were obtained from the base of the skull through the vertex without intravenous contrast.  COMPARISON:  None.  FINDINGS: There is age related volume loss. There is no demonstrable mass, hemorrhage, extra-axial fluid collection, or midline shift.  There is mild small vessel disease in the centra semiovale bilaterally. There is decreased attenuation in the midline of the upper to mid pons which may represent a small recent infarct in this area. No other acute appearing infarct is appreciable.  Bony calvarium appears intact.  The mastoid air cells are clear.  IMPRESSION: Age uncertain small infarct mid pons. Small infarct in this area may be recent.  There is underlying age related volume loss with mild periventricular small vessel disease. There is no intracranial mass or hemorrhage. No gray-white compartment edema is appreciable on this study.   Electronically Signed   By: Bretta Bang M.D.   On: 09/20/2013 12:54   Dg Chest Port 1 View  09/25/2013   CLINICAL DATA:  Edema after dialysis.  EXAM: PORTABLE CHEST - 1 VIEW  COMPARISON:  One-view chest 09/24/2013.  FINDINGS: Cardiac enlargement is stable. A left IJ line is stable in position. Interstitial edema is improved. No focal airspace consolidation is evident.  IMPRESSION: 1. Improved interstitial edema. 2. Stable cardiomegaly.   Electronically Signed   By: Gennette Pac M.D.   On:  09/25/2013 21:55   Dg Chest Port 1 View  09/24/2013   CLINICAL DATA:  Respiratory failure  EXAM: PORTABLE CHEST - 1 VIEW  COMPARISON:  09/23/2013  FINDINGS: Interval tracheal and esophageal extubation. Left IJ catheter in stable position, tip at the superior cavoatrial junction.  Unchanged cardiomegaly and pulmonary edema. These changes could obscure superimposed infection. No evidence of increased pleural fluid. No pneumothorax.  IMPRESSION: 1. Stable lung volumes after tracheal extubation. 2. Unchanged pulmonary edema, which could obscure superimposed consolidation.   Electronically Signed   By: Tiburcio Pea M.D.   On: 09/24/2013 06:23   Dg Chest Port 1 View  09/23/2013   CLINICAL DATA:  Respiratory failure.  EXAM: PORTABLE CHEST - 1 VIEW  COMPARISON:  09/22/2013.  FINDINGS: Endotracheal tube, left IJ line, NG tube in stable position. Persistent cardiomegaly and pulmonary venous congestion present. Interstitial prominence present. These findings consistent with congestive heart failure. Left base atelectasis and/or infiltrate present. Left pleural effusion has improved. No pneumothorax.  IMPRESSION: 1. Stable line and tube positions. 2. Findings consistent with congestive heart failure with pulmonary interstitial edema again noted. Left pleural effusion has improved. 3. Left lower lobe atelectasis and/or infiltrate.   Electronically Signed   By: Maisie Fus  Register   On: 09/23/2013 07:36   Dg Chest Port 1 View  09/22/2013   CLINICAL DATA:  Respiratory failure.  EXAM: PORTABLE CHEST - 1 VIEW  COMPARISON:  Chest radiograph September 21, 2013  FINDINGS: Endotracheal tube tip projects 4.2 cm above the carina. Left internal jugular central venous catheter with distal tip at the cavoatrial junction. Nasogastric tube past the gastroesophageal junction region code though, tip is not visualized. No pneumothorax.  Stable cardiomegaly, worsening interstitial edema, with minimal patchy airspace opacity in left lung  base, unchanged. Suspected small left pleural effusion. Soft tissue planes and included osseous structures are unchanged.  IMPRESSION: No apparent change in life-support lines.  Stable cardiomegaly with slightly worsening interstitial edema. Retrocardiac airspace opacity may reflect atelectasis or confluent edema with suspected small left pleural effusion.   Electronically Signed   By: Awilda Metro   On: 09/22/2013 06:31   Dg Chest Port 1 View  09/21/2013   CLINICAL DATA:  Respiratory distress.  Intubated patient.  EXAM: PORTABLE CHEST - 1 VIEW  COMPARISON:  09/20/2013  FINDINGS: Endotracheal tube tip lies 3 cm above the carina.  Nasogastric tube passes below the diaphragm well into the stomach. Left internal jugular central venous line tip lies the caval atrial junction.  Mild hazy airspace opacity is noted in the medial right lung base. This may reflect atelectasis. Infiltrate is possible. Lungs are otherwise clear. No pleural effusion or pneumothorax  IMPRESSION: 1. Support apparatus is stable in well positioned. 2. Mild hazy opacity in the medial right lung base, likely atelectasis. This potentially could reflect a small area of infiltrate or even asymmetric edema. Lungs are otherwise clear. Findings are similar to the prior exam allowing for differences in patient positioning and technique.   Electronically Signed   By: Amie Portland M.D.   On: 09/21/2013 12:27   Dg Chest Port 1 View  09/20/2013   CLINICAL DATA:  Post IJ placement  EXAM: PORTABLE CHEST - 1 VIEW  COMPARISON:  Prior chest x-ray 09/20/2013 at 9:19 a.m.  FINDINGS: Interval placement of a left IJ approach central venous catheter. The tip of the catheter is in good position at the superior cavoatrial junction. No evidence of complicating pneumothorax. The patient remains intubated. The tip of the endotracheal tube is 4 cm above the carina. A nasogastric tube is present, the tip lies off the field of view presumably within the stomach.  External defibrillator pads and metallic artifacts overlie the chest. Stable cardiomegaly, very low inspiratory volumes and mild pulmonary edema.  IMPRESSION: 1. New left IJ approach central venous catheter with the tip at the superior cavoatrial junction. No evidence of pneumothorax or other complication. 2. Otherwise, no significant interval change in the appearance of the chest.   Electronically Signed   By: Malachy Moan M.D.   On: 09/20/2013 11:48   Dg Chest Portable 1 View  09/20/2013   CLINICAL DATA:  Status post intubation.  EXAM: PORTABLE CHEST - 1 VIEW  COMPARISON:  None.  FINDINGS: 0919 hrs. Endotracheal tube tip is 3.8 cm above the base of the carina. The NG tube passes into the stomach although the distal tip position is not included on the film. Lung volumes are low. The cardio pericardial silhouette is enlarged. Diffuse interstitial opacity is associated with vascular congestion. Telemetry leads overlie the chest. No evidence for pneumothorax or substantial pleural effusion.  IMPRESSION: Low volume film with enlargement of the cardiopericardial silhouette. Vascular congestion with interstitial opacities suggest associated edema.   Electronically Signed   By: Kennith Center M.D.   On: 09/20/2013 09:30    Medications: Scheduled Meds: . acetaminophen  500 mg Oral TID  . alteplase  2 mg Intracatheter Once  . [START ON 10/13/2013] darbepoetin (ARANESP) injection - DIALYSIS  200 mcg Intravenous Q Wed-HD  . docusate sodium  100 mg Oral BID  . doxercalciferol  8 mcg Intravenous Q M,W,F-HD  . famotidine  20 mg Oral QHS  . feeding supplement (NEPRO CARB STEADY)  237 mL Oral QPC supper  . insulin aspart  0-9 Units Subcutaneous TID WC  . midodrine  5 mg Oral Q M,W,F-HD  . multivitamin  1 tablet Oral QHS  . pantoprazole (PROTONIX) IV  40 mg Intravenous Q12H  . sevelamer carbonate  1,600 mg Oral TID WC   Time Spent: 45 minutes   LOS: 21 days   Chaya Jan M.D. [facility] 10/11/2013, 2:52 PM Pager: [phone]  If 7PM-7AM, please contact night-coverage [email or web address] Password [email or web address]

## 2013-10-11 NOTE — Progress Notes (Signed)
UR Completed Gwendelyn Lanting Graves-Bigelow, RN,BSN [phone]  

## 2013-10-11 NOTE — Progress Notes (Signed)
PT Cancellation Note  Patient Details Name: Briana Gould MRN: [id number] DOB: Oct 14, 1945   Cancelled Treatment:    Reason Eval/Treat Not Completed: Patient at procedure or test/unavailable (in HD) PT to check back tomorrow.    Thanks,  Rollene Rotundaebecca B. Mercedes Valeriano, PT, DPT [phone]   10/11/2013, 4:00 PM

## 2013-10-11 NOTE — Progress Notes (Signed)
Subjective: No acute events.  Doing well at this time.    Objective: Vital signs in last 24 hours: Temp:  [97.7 F (36.5 C)-98.3 F (36.8 C)] 97.7 F (36.5 C) (02/02 0523) Pulse Rate:  [73-82] 73 (02/02 0523) Resp:  [15-18] 15 (02/02 0523) BP: (128-157)/(58-84) 149/84 mmHg (02/02 0523) SpO2:  [100 %] 100 % (02/02 0523) Weight:  [178 lb 2.1 oz (80.8 kg)] 178 lb 2.1 oz (80.8 kg) (02/01 1132) Last BM Date: 10/10/13  Intake/Output from previous day: 02/01 0701 - 02/02 0700 In: 1120 [P.O.:1120] Out: 3 [Urine:1; Stool:2] Intake/Output this shift:    General appearance: alert and no distress GI: soft, non-tender; bowel sounds normal; no masses,  no organomegaly  Lab Results:  Recent Labs  10/08/13 2209 10/09/13 0420 10/10/13 0530  WBC 14.2* 15.0* 10.7*  HGB 6.6* 7.8* 8.6*  HCT 20.0* 23.4* 25.5*  PLT 258 246 201   BMET  Recent Labs  10/09/13 1411  NA 135*  K 5.2  CL 91*  CO2 22  GLUCOSE 209*  BUN 101*  CREATININE 7.79*  CALCIUM 9.6   LFT  Recent Labs  10/09/13 1411  ALBUMIN 2.7*   PT/INR No results found for this basename: LABPROT, INR,  in the last 72 hours Hepatitis Panel No results found for this basename: HEPBSAG, HCVAB, HEPAIGM, HEPBIGM,  in the last 72 hours C-Diff No results found for this basename: CDIFFTOX,  in the last 72 hours Fecal Lactopherrin No results found for this basename: FECLLACTOFRN,  in the last 72 hours  Studies/Results: No results found.  Medications:  Scheduled: . acetaminophen  500 mg Oral TID  . alteplase  2 mg Intracatheter Once  . [START ON 10/13/2013] darbepoetin (ARANESP) injection - DIALYSIS  200 mcg Intravenous Q Wed-HD  . docusate sodium  100 mg Oral BID  . doxercalciferol  8 mcg Intravenous Q M,W,F-HD  . famotidine  20 mg Oral QHS  . feeding supplement (NEPRO CARB STEADY)  237 mL Oral QPC supper  . insulin aspart  0-9 Units Subcutaneous TID WC  . midodrine  5 mg Oral Q M,W,F-HD  . multivitamin  1 tablet Oral  QHS  . pantoprazole (PROTONIX) IV  40 mg Intravenous Q12H  . sevelamer carbonate  1,600 mg Oral TID WC   Continuous: . sodium chloride Stopped (10/04/13 1600)    Assessment/Plan: 1) History of esophagitis and PUD. 2) Carotid stenosis. 3) ESRD.   HGB has increased.  No overt signs of bleeding.  Again, I think the risks of a CVA outweigh the risk of bleeding, but this is a very difficult situation.  She needs to be on PPI for another 6 days or so before a retrial of anticoagulation, if she is still going to undergo stenting.  If she rebleeds with adequate and continued treatment of her esophagitis and PUD, the she cannot be on any anticoagulation.    Plan: 1) Continue with PPI. 2) ? Stenting.   LOS: 21 days   Briana Gould D 10/11/2013, 8:27 AM

## 2013-10-11 NOTE — Consult Note (Signed)
CARDIOLOGY CONSULT NOTE       [name] MRN: [id number] DOB/AGE: [birth year] 68 y.o.  Admit date: 09/20/2013 Referring Physician:  Loreta AveAcosta Primary Physician: Laurena SlimmerLARK,PRESTON S, MD Primary Cardiologist:  Excell Seltzerooper Reason for Consultation:  Preop  Principal Problem:   Cardiac arrest Active Problems:   ESRD (end stage renal disease)   Acute respiratory failure with hypoxia   Hypertension   Encephalopathy acute   H/O: GI bleed   CVA (cerebral infarction)   Paroxysmal a-fib   HPI:   Debilitated 68 yo dialysis patient Seen by Dr Excell Seltzerooper 09/20/13.  Presented  on 09/20/2013 with cardiac arrest. She was undergoing hemodialysis this morning and she became unresponsive. She was found to have PEA arrest. CPR was performed for 20 minutes. No medications were administered. She had return of spontaneous circulation with resuscitative efforts. We are asked to see her for further cardiac evaluation after her arrest. She has had syncope and collapse in the past and was just hospitalized in December 2014. At that time she had marked bradycardia and required dopamine. She was seen by our electrophysiology service and thought to be a very poor candidate for permanent pacemaker. This was in the setting of end-stage renal disease, right internal jugular vein DVT, left arm AV fistula, and subclavian catheter for dialysis. After several days off of her beta blocker, she was able to be weaned from dopamine and had no further issues. Her course 3 weeks ago was remarkable for stable hemodynamics , no heart block, R/O and no arrhythmias while intubated.  At that time no cath planned as etiology of her event seemed more from cerebral hypoperfusion.  There has been talk of performing stenting.  She has had 2.5 weeks of dialysis without incident for full 4 hours.  Renal has wanted to explore peritoneal dialysis to minimize hemodynamic swings that may ppt events.  Cannot find consult note but short note from Dr  Dwain SarnaWakefield indicates that he would not do presumed surgery to place peritoneal catheter   Patient has had no chest pain.  Reviewed her cath from 08/31/2010 Done by Dr Teressa LowerBensimohn  Diffuse disease in distal RCA/PDA  Distal circumflex and diagonals.  40% calcified mid LAD.  [facility] was advised and reviewed with Dr Excell Seltzerooper.     ROS All other systems reviewed and negative except as noted above  Past Medical History  Diagnosis Date  . End stage renal disease on dialysis   . Peripheral arterial occlusive disease   . Hypertension   . Cardiac arrest   . Diabetes mellitus type 2, uncontrolled, with complications   . DVT (deep venous thrombosis)      Right internal jugular vein  . CAD (coronary artery disease), native coronary artery 2011    Diffuse distal vessel CAD noted at cardiac catheterization    History reviewed. No pertinent family history.  History   Social History  . Marital Status: Married    Spouse Name: N/A    Number of Children: N/A  . Years of Education: N/A   Occupational History  . Not on file.   Social History Main Topics  . Smoking status: Never Smoker   . Smokeless tobacco: Not on file  . Alcohol Use: No  . Drug Use: No  . Sexual Activity: Not on file   Other Topics Concern  . Not on file   Social History Narrative   Nonsmoker, no alcohol. Supportive family.    Past Surgical History  Procedure  Laterality Date  . Below knee leg amputation Left   . Av fistula placement Left      . acetaminophen  500 mg Oral TID  . alteplase  2 mg Intracatheter Once  . [START ON 10/13/2013] darbepoetin (ARANESP) injection - DIALYSIS  200 mcg Intravenous Q Wed-HD  . docusate sodium  100 mg Oral BID  . doxercalciferol  8 mcg Intravenous Q M,W,F-HD  . famotidine  20 mg Oral QHS  . feeding supplement (NEPRO CARB STEADY)  237 mL Oral QPC supper  . insulin aspart  0-9 Units Subcutaneous TID WC  . midodrine  5 mg Oral Q M,W,F-HD  . multivitamin  1  tablet Oral QHS  . pantoprazole (PROTONIX) IV  40 mg Intravenous Q12H  . sevelamer carbonate  1,600 mg Oral TID WC   . sodium chloride Stopped (10/04/13 1600)    Physical Exam: Blood pressure 152/72, pulse 75, temperature 97.5 F (36.4 C), temperature source Oral, resp. rate 16, height 5\' 6"  (1.676 m), weight 193 lb 2 oz (87.6 kg), SpO2 100.00%.    Affect appropriate Chronically ill black female  HEENT: normal Neck supple with no adenopathy JVP normal loud left  bruits no thyromegaly Lungs clear with no wheezing and good diaphragmatic motion Heart:  S1/S2 AS murmur, no rub, gallop or click PMI normal Abdomen: benighn, BS positve, no tenderness, no AAA no bruit.  No HSM or HJR Distal pulses intact with no bruits No edema Neuro non-focal Skin warm and dry Left AKAmputation  LUE dialysis catheter    Labs:   Lab Results  Component Value Date   WBC 11.4* 10/11/2013   HGB 8.9* 10/11/2013   HCT 27.5* 10/11/2013   MCV 95.8 10/11/2013   PLT 230 10/11/2013    Recent Labs Lab 10/07/13 0007  10/11/13 1243  NA 137  < > 136*  K 3.7  < > 5.3  CL 95*  < > 94*  CO2 27  < > 25  BUN 19  < > 45*  CREATININE 3.91*  < > 6.24*  CALCIUM 9.5  < > 9.7  PROT 6.5  --   --   BILITOT 0.3  --   --   ALKPHOS 91  --   --   ALT 11  --   --   AST 19  --   --   GLUCOSE 192*  < > 160*  < > = values in this interval not displayed. Lab Results  Component Value Date   TROPONINI 1.65* 09/21/2013       Radiology:   09/30/2013   CLINICAL DATA:  Patient with transient left-sided weakness. Abnormal MRA of the brain and neck.  EXAM: BILATERAL COMMON CAROTID AND INNOMINATE ANGIOGRAPHY AND BILATERAL VERTEBRAL ARTERY ANGIOGRAMS:  MEDICATIONS: Versed 1 mg IV. Fentanyl 25 mcg IV.  ANESTHESIA/SEDATION: Conscious sedation.  CONTRAST:  80mL OMNIPAQUE IOHEXOL 300 MG/ML  SOLN  PROCEDURE: Following a full explanation of the procedure along with the potential associated complications, an informed witnessed consent was  obtained.  The right groin was prepped and draped in the usual sterile fashion. Thereafter using modified Seldinger technique, transfemoral access into the right common femoral artery was obtained without difficulty. Over a 0.035 inch guidewire, a 5 French Pinnacle sheath was inserted. Through this and also over a 0.035 inch guidewire, a 5 French JB1 catheter was advanced into the aortic arch region and selectively positioned in the right common carotid artery, the right subclavian artery, the left common carotid artery  and the left subclavian artery.  There were no acute complications. The patient tolerated the procedure well.  COMPLICATIONS: None immediate.  FINDINGS: The right common carotid arteriogram demonstrates the right external carotid artery and its major branches to be normal.  The right internal carotid artery at the bulb is patent. Just distal to the bulb there is a focal stenosis of 65% - 70%.  Distal to this the vessel opacifies normally to the cranial skull base.  There is normal opacification of the right ICA in the distal cervical segment.  Mild narrowing is seen of the cervical petrous junction and the cavernous petrous junction.  The cavernous segment distally appears widely patent. The supraclinoid segment is also widely patent.  The right middle cerebral artery proximally just distal to its origin demonstrates a 50% stenosis. A 40% stenosis is seen of the right MCA just distal to the anterior temporal branch.  The right middle cerebral artery distribution and the right anterior cerebral artery distribution distally demonstrates normal opacification into the capillary and venous phases. Cross filling via the anterior communicating artery of the left anterior cerebral artery A2 segment is seen. Occlusion of the left anterior cerebral artery A1 segment is noted.  The right vertebral artery origin is normal.  The vessel opacifies to the cranial skull base where it terminates primarily in the  ipsilateral right posterior-inferior cerebellar artery.  No flow is seen into the right vertebrobasilar junction.  The left vertebral artery origin is widely patent.  The vessel opacifies to the cranial skull base.  There is normal opacification of the left posterior inferior cerebellar artery and the left vertebrobasilar junction.  Opacification of the basilar artery, the superior cerebellar arteries, the posterior cerebral arteries and anterior inferior cerebellar arteries is seen. Also noted is the stump of the occluded right vertebrobasilar junction.  The posterior cerebral arteries demonstrate moderate narrowing in the P2 segments and the P3 segments consistent with intracranial arteriosclerosis.  The left common carotid arteriogram demonstrates the left external carotid artery to be narrowed by 50%. Its branches are normal.  The left internal carotid artery at the bulb to the cranial skull base opacifies normally.  There is mild narrowing of the distal cervical vertical segment.  A 50% stenosis is noted of the distal horizontal segment of the petrous portion, with a 95% plus focal stenosis of the left internal carotid artery in the petrous cavernous junction.  The cavernous segment distal to this remains widely patent as does the supraclinoid segment.  The left middle cerebral artery opacifies into the capillary and venous phases.  Nonvisualization of the left anterior cerebral artery A1 segment is seen consistent with occlusion.  IMPRESSION: 95% plus focal stenoses of the left internal carotid artery petrous cavernous junction, with approximately 50% stenosis of the horizontal segment of the petrous portion of the left internal carotid artery.  Occluded right vertebrobasilar junction distal to the right posterior-inferior cerebellar artery.  50% stenosis of the right middle cerebral artery proximally. Approximately 65% percent stenosis the right internal carotid artery just distal to the bulb.    Electronically Signed   By: Julieanne Cotton M.D.   On: 09/28/2013 13:53    EKG:  SR rate 75 LBBB    ASSESSMENT AND PLAN:  Preop:  Will have to find out from surgery/renal exactly what issues/plan are.  Clearly she is high risk for general anesthesia.  However off beta blockers she has not had any rhythm issues with her "events" on dialysis.  Her last  episode appeared to be neurologic as she recovered with no CPR meds and had normal rhythm during entire event.  It has been 4 years since her cath which showed diffuse distal small vessel disease Echo 09/20/13 with EF 50-55% and ? Mild AS doppler poor.  Discussed with Dr Excell Seltzer.  Will see if patient willing to have diagnostic cath to r/o new more proximal large vessel disease that could be impacting her hemodialysis .  Will discuss with her in am and tentatively plan cath for 2/3 or 2/4     Signed: Charlton Haws 10/11/2013, 5:28 PM

## 2013-10-11 NOTE — Consult Note (Signed)
Briana Gould September 05, 1946  [id number].   Requesting MD: Dr. Roney Jaffe Chief Complaint/Reason for Consult: request for PD cath HPI: This is a 68 yo female with ESRD who has arrested twice in HD who has PAD and other co-morbidities.  We have been asked to see the patient to consider placement of a PD catheter which apparently has less risk for complications during dialysis that put her at less risk for cardiac arrest.  The patient denies abdominal surgery or pain.  ROS: Please see HPI, otherwise all other systems have been reviewed  History reviewed. No pertinent family history.  Past Medical History  Diagnosis Date  . End stage renal disease on dialysis   . Peripheral arterial occlusive disease   . Hypertension   . Cardiac arrest   . Diabetes mellitus type 2, uncontrolled, with complications   . DVT (deep venous thrombosis)      Right internal jugular vein  . CAD (coronary artery disease), native coronary artery 2011    Diffuse distal vessel CAD noted at cardiac catheterization    Past Surgical History  Procedure Laterality Date  . Below knee leg amputation Left   . Av fistula placement Left     Social History:  reports that she has never smoked. She does not have any smokeless tobacco history on file. She reports that she does not drink alcohol or use illicit drugs.  Allergies: No Known Allergies  Medications Prior to Admission  Medication Sig Dispense Refill  . amLODipine (NORVASC) 5 MG tablet Take 5 mg by mouth daily.      Marland Kitchen b complex-vitamin c-folic acid (NEPHRO-VITE) 0.8 MG TABS tablet Take 1 tablet by mouth daily.      Marland Kitchen doxazosin (CARDURA) 8 MG tablet Take 8 mg by mouth daily.      . famotidine (PEPCID) 40 MG tablet Take 40 mg by mouth daily.      . hydrALAZINE (APRESOLINE) 50 MG tablet Take 50 mg by mouth 2 (two) times daily.      Marland Kitchen lisinopril (PRINIVIL,ZESTRIL) 40 MG tablet Take 40 mg by mouth daily.      . Nutritional Supplements (FEEDING SUPPLEMENT, NEPRO CARB  STEADY,) LIQD Take 237 mLs by mouth daily.      . sevelamer carbonate (RENVELA) 800 MG tablet Take 1,600 mg by mouth 3 (three) times daily with meals.       . traMADol (ULTRAM) 50 MG tablet Take 50 mg by mouth every 12 (twelve) hours as needed (for pain).         Blood pressure 149/84, pulse 73, temperature 97.7 F (36.5 C), temperature source Oral, resp. rate 15, height _0  (1.676 m), weight 178 lb 2.1 oz (80.8 kg), SpO2 100.00%. Physical Exam: General: pleasant, obese black female who is laying in bed in NAD HEENT: head is normocephalic, atraumatic.  Sclera are noninjected.  PERRL.  Ears and nose without any masses or lesions.  Mouth is pink and moist Heart: regular, rate, and rhythm.  Normal s1,s2. No obvious murmurs, gallops, or rubs noted.  Palpable right pedal pulse.  Left BKA Lungs: CTAB, no wheezes, rhonchi, or rales noted.  Respiratory effort nonlabored Abd: soft, NT, ND, +BS, no masses, hernias, or organomegaly Psych: A&Ox3 with an appropriate affect.    Results for orders placed during the hospital encounter of 09/20/13 (from the past 48 hour(s))  GLUCOSE, CAPILLARY     Status: Abnormal   Collection Time    10/09/13 11:59 AM      Result  Value Range   Glucose-Capillary 142 (*) 70 - 99 mg/dL   Comment 1 Notify RN    RENAL FUNCTION PANEL     Status: Abnormal   Collection Time    10/09/13  2:11 PM      Result Value Range   Sodium 135 (*) 137 - 147 mEq/L   Potassium 5.2  3.7 - 5.3 mEq/L   Chloride 91 (*) 96 - 112 mEq/L   CO2 22  19 - 32 mEq/L   Glucose, Bld 209 (*) 70 - 99 mg/dL   BUN 101 (*) 6 - 23 mg/dL   Creatinine, Ser 7.79 (*) 0.50 - 1.10 mg/dL   Calcium 9.6  8.4 - 10.5 mg/dL   Phosphorus 3.7  2.3 - 4.6 mg/dL   Albumin 2.7 (*) 3.5 - 5.2 g/dL   GFR calc non Af Amer 5 (*) >90 mL/min   GFR calc Af Amer 6 (*) >90 mL/min   Comment: (NOTE)     The eGFR has been calculated using the CKD EPI equation.     This calculation has not been validated in all clinical  situations.     eGFR's persistently <90 mL/min signify possible Chronic Kidney     Disease.  GLUCOSE, CAPILLARY     Status: Abnormal   Collection Time    10/09/13  8:26 PM      Result Value Range   Glucose-Capillary 117 (*) 70 - 99 mg/dL  CBC     Status: Abnormal   Collection Time    10/10/13  5:30 AM      Result Value Range   WBC 10.7 (*) 4.0 - 10.5 K/uL   RBC 2.73 (*) 3.87 - 5.11 MIL/uL   Hemoglobin 8.6 (*) 12.0 - 15.0 g/dL   HCT 25.5 (*) 36.0 - 46.0 %   MCV 93.4  78.0 - 100.0 fL   MCH 31.5  26.0 - 34.0 pg   MCHC 33.7  30.0 - 36.0 g/dL   RDW 18.9 (*) 11.5 - 15.5 %   Platelets 201  150 - 400 K/uL  GLUCOSE, CAPILLARY     Status: Abnormal   Collection Time    10/10/13  8:05 AM      Result Value Range   Glucose-Capillary 138 (*) 70 - 99 mg/dL   Comment 1 Notify RN    GLUCOSE, CAPILLARY     Status: Abnormal   Collection Time    10/10/13 11:45 AM      Result Value Range   Glucose-Capillary 145 (*) 70 - 99 mg/dL   Comment 1 Notify RN    GLUCOSE, CAPILLARY     Status: Abnormal   Collection Time    10/10/13  4:58 PM      Result Value Range   Glucose-Capillary 161 (*) 70 - 99 mg/dL   Comment 1 Notify RN    GLUCOSE, CAPILLARY     Status: Abnormal   Collection Time    10/10/13  8:48 PM      Result Value Range   Glucose-Capillary 186 (*) 70 - 99 mg/dL  GLUCOSE, CAPILLARY     Status: Abnormal   Collection Time    10/11/13  7:41 AM      Result Value Range   Glucose-Capillary 143 (*) 70 - 99 mg/dL   No results found.     Assessment/Plan 1. ESRD, on HD with multiple cardiac arrests Patient Active Problem List   Diagnosis Date Noted  . CVA (cerebral infarction) 10/01/2013  . Paroxysmal  a-fib 10/01/2013  . H/O: GI bleed 09/25/2013  . Cardiac arrest 09/20/2013  . ESRD (end stage renal disease) 09/20/2013  . Acute respiratory failure with hypoxia 09/20/2013  . Hypertension 09/20/2013  . Encephalopathy acute 09/20/2013   Plan: 1. The patient is not a general anesthesia  candidate given her multiple comorbidities and her recurrent cardiac arrests on HD.  Unfortunately for her, a PD catheter is not an option.  Other methods will have to be considered by nephrology.  This was d/w the patient and a female family member who was present.  We will sign off.   Oasis Goehring E 10/11/2013, 11:17 AM Pager: [phone]

## 2013-10-12 LAB — GLUCOSE, CAPILLARY
Glucose-Capillary: 148 mg/dL — ABNORMAL HIGH (ref 70–99)
Glucose-Capillary: 167 mg/dL — ABNORMAL HIGH (ref 70–99)
Glucose-Capillary: 136 mg/dL — ABNORMAL HIGH (ref 70–99)
Glucose-Capillary: 153 mg/dL — ABNORMAL HIGH (ref 70–99)

## 2013-10-12 NOTE — Progress Notes (Signed)
[facility] ROUNDING NOTE   Subjective:   Interval History: no complaints although many questions. Very complex situations of cardiac arrest and advanced vascular disease. High risk for surgery and general anesthesia. Discussed with Dr Lowell GuitarPowell and Dr Deterding yesterday. Changing modality may not make much difference. Revisiting cardiac issues today and appreciate Dr Jodelle RedNishen's help  Objective:  Vital signs in last 24 hours:  Temp:  [97.5 F (36.4 C)-98.6 F (37 C)] 98.2 F (36.8 C) (02/03 0503) Pulse Rate:  [71-84] 76 (02/03 0503) Resp:  [16-17] 17 (02/03 0503) BP: (142-155)/(59-78) 143/77 mmHg (02/03 0503) SpO2:  [97 %-100 %] 99 % (02/03 0503) Weight:  [87.6 kg (193 lb 2 oz)] 87.6 kg (193 lb 2 oz) (02/02 1310)  Weight change: 6.8 kg (14 lb 15.9 oz) Filed Weights   10/10/13 0514 10/10/13 1132 10/11/13 1310  Weight: 85.1 kg (187 lb 9.8 oz) 80.8 kg (178 lb 2.1 oz) 87.6 kg (193 lb 2 oz)    Intake/Output: I/O last 3 completed shifts: In: 720 [P.O.:720] Out: 2200 [Other:2200]   Intake/Output this shift:     CVS- RRR  Systolic ejection murmur RS- CTA  No wheezes or rales ABD- BS present soft non-distended EXT- no edema   Basic Metabolic Panel:  Recent Labs Lab 10/06/13 1030 10/07/13 0007 10/08/13 0442 10/09/13 1411 10/11/13 1243  NA 137 137  --  135* 136*  K 4.4 3.7  --  5.2 5.3  CL 95* 95*  --  91* 94*  CO2 26 27  --  22 25  GLUCOSE 202* 192*  --  209* 160*  BUN 36* 19  --  101* 45*  CREATININE 6.78* 3.91* 5.99* 7.79* 6.24*  CALCIUM 9.8 9.5  --  9.6 9.7  PHOS 4.0  --   --  3.7 3.8    Liver Function Tests:  Recent Labs Lab 10/06/13 1030 10/07/13 0007 10/09/13 1411 10/11/13 1243  AST  --  19  --   --   ALT  --  11  --   --   ALKPHOS  --  91  --   --   BILITOT  --  0.3  --   --   PROT  --  6.5  --   --   ALBUMIN 2.8* 2.7* 2.7* 3.0*   No results found for this basename: LIPASE, AMYLASE,  in the last 168 hours No results found for this  basename: AMMONIA,  in the last 168 hours  CBC:  Recent Labs Lab 10/07/13 0007 10/08/13 1846 10/08/13 2209 10/09/13 0420 10/10/13 0530 10/11/13 1243  WBC 8.6 12.5* 14.2* 15.0* 10.7* 11.4*  NEUTROABS 6.3  --   --   --   --   --   HGB 8.4* 6.6* 6.6* 7.8* 8.6* 8.9*  HCT 25.7* 20.4* 20.0* 23.4* 25.5* 27.5*  MCV 96.3 95.8 95.2 95.9 93.4 95.8  PLT 243 252 258 246 201 230    Cardiac Enzymes: No results found for this basename: CKTOTAL, CKMB, CKMBINDEX, TROPONINI,  in the last 168 hours  BNP: No components found with this basename: POCBNP,   CBG:  Recent Labs Lab 10/10/13 2048 10/11/13 0741 10/11/13 1140 10/11/13 2126 10/12/13 0809  GLUCAP 186* 143* 185* 268* 148*    Microbiology: Results for orders placed during the hospital encounter of 09/20/13  MRSA PCR SCREENING     Status: None   Collection Time    09/20/13  1:06 PM      Result Value Range Status  MRSA by PCR NEGATIVE  NEGATIVE Final   Comment:            The GeneXpert MRSA Assay (FDA     approved for NASAL specimens     only), is one component of a     comprehensive MRSA colonization     surveillance program. It is not     intended to diagnose MRSA     infection nor to guide or     monitor treatment for     MRSA infections.    Coagulation Studies: No results found for this basename: LABPROT, INR,  in the last 72 hours  Urinalysis: No results found for this basename: COLORURINE, APPERANCEUR, LABSPEC, PHURINE, GLUCOSEU, HGBUR, BILIRUBINUR, KETONESUR, PROTEINUR, UROBILINOGEN, NITRITE, LEUKOCYTESUR,  in the last 72 hours    Imaging: No results found.   Medications:   . sodium chloride Stopped (10/04/13 1600)   . acetaminophen  500 mg Oral TID  . alteplase  2 mg Intracatheter Once  . [START ON 10/13/2013] darbepoetin (ARANESP) injection - DIALYSIS  200 mcg Intravenous Q Wed-HD  . docusate sodium  100 mg Oral BID  . doxercalciferol  8 mcg Intravenous Q M,W,F-HD  . famotidine  20 mg Oral QHS  .  feeding supplement (NEPRO CARB STEADY)  237 mL Oral QPC supper  . insulin aspart  0-9 Units Subcutaneous TID WC  . midodrine  5 mg Oral Q M,W,F-HD  . multivitamin  1 tablet Oral QHS  . pantoprazole (PROTONIX) IV  40 mg Intravenous Q12H  . 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 Readmitted for PEA arrest while on dialysis. Patient cardiac arrest etiology is not fully determined, question carotid stenosis, severe pulm HTN or severe CAD may be contributing when she is becoming hypotensivie during dialysis sessions, cardiac arrytmia. Plan is for intracranial angiogram and or possible stent 1-29 to see if that help with circulation. Not safe to discharge patient to outpatient dialysis    ESRD Dialysis MWF Unfortunately several cardiac arrests with Hemodialysis now exploring ideas such as Next stage . Complicated patient that is a very high surgical risk HTN controlled Midodrine support  Anemia darbepoietin transfused  Bones Hectorol and renvela  Carotid disease anticoagulation held for GI bleed. Family and Dr Bedelia Person leaning toward medical management  GI bleed  PPI x next few days and then retrial anticoagulation  Discussed with daughter Briana Gould In room, she would like more information about home therapy and will meet with the home therapy team this a.m. Dr Lowell Guitar interested in re-evaluating cardiac issues    LOS:  22 Briana Gould W @TODAY @10 :53 AM

## 2013-10-12 NOTE — Progress Notes (Signed)
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 Readmitted for PEA arrest while on dialysis. Patient cardiac arrest etiology is not fully determined, question carotid stenosis, severe pulm HTN or severe CAD may be contributing when she is becoming hypotensivie during dialysis sessions, cardiac arrytmia. Plan is for intracranial angiogram and or possible stent 1-29 to see if that help with circulation. Not safe to discharge patient to outpatient dialysis. Appreciate Dr Arlean Hopping help. Stent had to be cancelled due to upper GI Bleed on 1/30 necessitating we stop ASA and Brillinta. Cards has been consulted and is considering further cardiac work up on this patient and even the possibility of a PPM.  Assessment/Plan:   -A fib: RVR with subsequent cardiac pause 6.8 sec on 1-23 after dialysis. Patient Blood pressure decrease during dialysis 1-23. HR increased. She received IV bolus. Cardiology consulted. She was on coumadin at some point but this was stop due to bleed. Difficult situation with anticoagulation. Patient converted spontaneously. She had a pause of 6.8 sec. She didn't received any medications.  No good candidate for pacer per cardio evaluation. No further evaluation per cardio.  -now sinus rhythm.  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  - Multiple times - etiology not fully determined, but suspect related to cerebral hypoperfusion during HD with volume and BP shifts. -No further evaluation per cardio.   Carotid artery stenosis - 1-39% stenosis involving the left internal  carotid artery. - Findings consistent with 60-79% right ICA stenosis  - interventional radiology has been consulted, cerebral angiogram done  by Dr. Corliss Skains.  -Severe stenosis of LT ICA pet cavernous junction of 95% , 60 % stenosis of RT ICA prox.. 50 % steosis of LT ICA petrous seg. Occluded RT VBJ -Plan for intracranial angiogram and or possible stent 1-29. -Per Dr. Corliss Skains: resistant to plavix. Switch to Brilinta and reschedule angiogram for early next week.  Upper GI Bleed -1/30: had a episode of coffee ground emesis (aprox 1 L). -Will DC ASA and Brillinta. -Protonix IV BID. -Had EGD in 12/14 with findings of esophagitis, antral and duodenal ulcers. -Hemodynamically stable. -Stat CBC and follow q 8 hours for 24 hours. -Have discussed with Dr. Elnoria Howard.  -Leukocytosis; Resolved  -Chest pain: resolved.  -Musculoskeletal likely due to CPR with chest wall tenderness improving:  - Placed her on tramadol as needed and Tylenol 500mg  TID.   -ESRD (end stage renal disease)  - per nephrology.  -Will hold HD 1/30 due to acute episode of GI Bleed. -Exploring alternate methods to HD: surgery has refused to place PD catheter due to risks with anesthesia.  -Acute Respiratory failure with hypoxia/ Pulmonary edema/ diastolic CHF  - s/p arrest  - nephrology team to manage fluid balance    Severe Pulmonary HTN with moderate TR, CAD  - cath in 2011 revealed diffuse distal disease   Right IJ thrombus  -found on a previous admission- not a candidate for anticoagulation due to PUD with bleed in 12/14 in setting of Coumadin   PUD - cont Pepcid  Hypertension  - Hydralazine  PRN. Careful with Hypotension.   Encephalopathy acute - likely due to anoxia secondary to cardiac arrest- may have new cognitive baseline-  - Levaquin started on 1/16- continue for 5 days . Finished course.  -Resolved.   Severe PAD with left BKA.   DVT Prophylaxis: SCDs  Code Status: Full code  Family  Communication: Care discussed with patient.   Disposition: Transfer to telemetry.   SIGNIFICANT EVENTS / STUDIES:  1/12 cardiac arrest/ PEA at HD. 20 minutes estimated to ROSC  1/12 Echocardiogram: LVEF 50-55%. No RWMAs. Moderate LVH. Probably mild AS. Mild MR. RVSP est 69 mmHg  1/12 CT head: Age uncertain small infarct mid pons. Small infarct in this area may be recent.  1/13 Cognition much improved. Failed SBT  1/15 Extubated successfully  1/20: Cerebral angiogram done 1/30: Upper GI Bleed (coffee-ground emesis).   Subjective:  Significant episode of coffee-ground emesis today.  Objective: Weight change: 6.8 kg (14 lb 15.9 oz)  Intake/Output Summary (Last 24 hours) at 10/12/13 1400 Last data filed at 10/11/13 1725  Gross per 24 hour  Intake      0 ml  Output   2200 ml  Net  -2200 ml   Blood pressure 143/77, pulse 76, temperature 98.2 F (36.8 C), temperature source Oral, resp. rate 17, height 5\' 6"  (1.676 m), weight 87.6 kg (193 lb 2 oz), SpO2 99.00%.  Physical Exam: General: Alert and awake, oriented. CVS: S1-S2 clear Chest: CTAB Abdomen: soft nontender, nondistended, normal bowel sounds  Extremities: no cyanosis, clubbing left BKA,   Lab Results: Basic Metabolic Panel:  Recent Labs Lab 10/09/13 1411 10/11/13 1243  NA 135* 136*  K 5.2 5.3  CL 91* 94*  CO2 22 25  GLUCOSE 209* 160*  BUN 101* 45*  CREATININE 7.79* 6.24*  CALCIUM 9.6 9.7  PHOS 3.7 3.8   Liver Function Tests:  Recent Labs Lab 10/07/13 0007 10/09/13 1411 10/11/13 1243  AST 19  --   --   ALT 11  --   --   ALKPHOS 91  --   --   BILITOT 0.3  --   --   PROT 6.5  --   --   ALBUMIN 2.7* 2.7* 3.0*   No results found for this basename: LIPASE, AMYLASE,  in the last 168 hours No results found for this basename: AMMONIA,  in the last 168 hours CBC:  Recent Labs Lab 10/07/13 0007  10/10/13 0530 10/11/13 1243  WBC 8.6  < > 10.7* 11.4*  NEUTROABS 6.3  --   --   --   HGB 8.4*  < > 8.6*  8.9*  HCT 25.7*  < > 25.5* 27.5*  MCV 96.3  < > 93.4 95.8  PLT 243  < > 201 230  < > = values in this interval not displayed. Cardiac Enzymes: No results found for this basename: CKTOTAL, CKMB, CKMBINDEX, TROPONINI,  in the last 168 hours BNP: No components found with this basename: POCBNP,  CBG:  Recent Labs Lab 10/11/13 0741 10/11/13 1140 10/11/13 2126 10/12/13 0809 10/12/13 1143  GLUCAP 143* 185* 268* 148* 167*     Micro Results: No results found for this or any previous visit (from the past 240 hour(s)).  Studies/Results: Ct Head Wo Contrast  09/20/2013   CLINICAL DATA:  Altered mental status ; status post cardiopulmonary resuscitation  EXAM: CT HEAD WITHOUT CONTRAST  TECHNIQUE: Contiguous axial images were obtained from the base of the skull through the vertex  without intravenous contrast.  COMPARISON:  None.  FINDINGS: There is age related volume loss. There is no demonstrable mass, hemorrhage, extra-axial fluid collection, or midline shift. There is mild small vessel disease in the centra semiovale bilaterally. There is decreased attenuation in the midline of the upper to mid pons which may represent a small recent infarct in this area. No other acute appearing infarct is appreciable.  Bony calvarium appears intact.  The mastoid air cells are clear.  IMPRESSION: Age uncertain small infarct mid pons. Small infarct in this area may be recent.  There is underlying age related volume loss with mild periventricular small vessel disease. There is no intracranial mass or hemorrhage. No gray-white compartment edema is appreciable on this study.   Electronically Signed   By: Bretta BangWilliam  Woodruff M.D.   On: 09/20/2013 12:54   Dg Chest Port 1 View  09/25/2013   CLINICAL DATA:  Edema after dialysis.  EXAM: PORTABLE CHEST - 1 VIEW  COMPARISON:  One-view chest 09/24/2013.  FINDINGS: Cardiac enlargement is stable. A left IJ line is stable in position. Interstitial edema is improved. No focal  airspace consolidation is evident.  IMPRESSION: 1. Improved interstitial edema. 2. Stable cardiomegaly.   Electronically Signed   By: Gennette Pachris  Mattern M.D.   On: 09/25/2013 21:55   Dg Chest Port 1 View  09/24/2013   CLINICAL DATA:  Respiratory failure  EXAM: PORTABLE CHEST - 1 VIEW  COMPARISON:  09/23/2013  FINDINGS: Interval tracheal and esophageal extubation. Left IJ catheter in stable position, tip at the superior cavoatrial junction.  Unchanged cardiomegaly and pulmonary edema. These changes could obscure superimposed infection. No evidence of increased pleural fluid. No pneumothorax.  IMPRESSION: 1. Stable lung volumes after tracheal extubation. 2. Unchanged pulmonary edema, which could obscure superimposed consolidation.   Electronically Signed   By: Tiburcio PeaJonathan  Watts M.D.   On: 09/24/2013 06:23   Dg Chest Port 1 View  09/23/2013   CLINICAL DATA:  Respiratory failure.  EXAM: PORTABLE CHEST - 1 VIEW  COMPARISON:  09/22/2013.  FINDINGS: Endotracheal tube, left IJ line, NG tube in stable position. Persistent cardiomegaly and pulmonary venous congestion present. Interstitial prominence present. These findings consistent with congestive heart failure. Left base atelectasis and/or infiltrate present. Left pleural effusion has improved. No pneumothorax.  IMPRESSION: 1. Stable line and tube positions. 2. Findings consistent with congestive heart failure with pulmonary interstitial edema again noted. Left pleural effusion has improved. 3. Left lower lobe atelectasis and/or infiltrate.   Electronically Signed   By: Maisie Fushomas  Register   On: 09/23/2013 07:36   Dg Chest Port 1 View  09/22/2013   CLINICAL DATA:  Respiratory failure.  EXAM: PORTABLE CHEST - 1 VIEW  COMPARISON:  Chest radiograph September 21, 2013  FINDINGS: Endotracheal tube tip projects 4.2 cm above the carina. Left internal jugular central venous catheter with distal tip at the cavoatrial junction. Nasogastric tube past the gastroesophageal junction  region code though, tip is not visualized. No pneumothorax.  Stable cardiomegaly, worsening interstitial edema, with minimal patchy airspace opacity in left lung base, unchanged. Suspected small left pleural effusion. Soft tissue planes and included osseous structures are unchanged.  IMPRESSION: No apparent change in life-support lines.  Stable cardiomegaly with slightly worsening interstitial edema. Retrocardiac airspace opacity may reflect atelectasis or confluent edema with suspected small left pleural effusion.   Electronically Signed   By: Awilda Metroourtnay  Bloomer   On: 09/22/2013 06:31   Dg Chest Port 1 View  09/21/2013   CLINICAL DATA:  Respiratory distress.  Intubated patient.  EXAM: PORTABLE CHEST - 1 VIEW  COMPARISON:  09/20/2013  FINDINGS: Endotracheal tube tip lies 3 cm above the carina. Nasogastric tube passes below the diaphragm well into the stomach. Left internal jugular central venous line tip lies the caval atrial junction.  Mild hazy airspace opacity is noted in the medial right lung base. This may reflect atelectasis. Infiltrate is possible. Lungs are otherwise clear. No pleural effusion or pneumothorax  IMPRESSION: 1. Support apparatus is stable in well positioned. 2. Mild hazy opacity in the medial right lung base, likely atelectasis. This potentially could reflect a small area of infiltrate or even asymmetric edema. Lungs are otherwise clear. Findings are similar to the prior exam allowing for differences in patient positioning and technique.   Electronically Signed   By: Amie Portland M.D.   On: 09/21/2013 12:27   Dg Chest Port 1 View  09/20/2013   CLINICAL DATA:  Post IJ placement  EXAM: PORTABLE CHEST - 1 VIEW  COMPARISON:  Prior chest x-ray 09/20/2013 at 9:19 a.m.  FINDINGS: Interval placement of a left IJ approach central venous catheter. The tip of the catheter is in good position at the superior cavoatrial junction. No evidence of complicating pneumothorax. The patient remains intubated.  The tip of the endotracheal tube is 4 cm above the carina. A nasogastric tube is present, the tip lies off the field of view presumably within the stomach. External defibrillator pads and metallic artifacts overlie the chest. Stable cardiomegaly, very low inspiratory volumes and mild pulmonary edema.  IMPRESSION: 1. New left IJ approach central venous catheter with the tip at the superior cavoatrial junction. No evidence of pneumothorax or other complication. 2. Otherwise, no significant interval change in the appearance of the chest.   Electronically Signed   By: Malachy Moan M.D.   On: 09/20/2013 11:48   Dg Chest Portable 1 View  09/20/2013   CLINICAL DATA:  Status post intubation.  EXAM: PORTABLE CHEST - 1 VIEW  COMPARISON:  None.  FINDINGS: 0919 hrs. Endotracheal tube tip is 3.8 cm above the base of the carina. The NG tube passes into the stomach although the distal tip position is not included on the film. Lung volumes are low. The cardio pericardial silhouette is enlarged. Diffuse interstitial opacity is associated with vascular congestion. Telemetry leads overlie the chest. No evidence for pneumothorax or substantial pleural effusion.  IMPRESSION: Low volume film with enlargement of the cardiopericardial silhouette. Vascular congestion with interstitial opacities suggest associated edema.   Electronically Signed   By: Kennith Center M.D.   On: 09/20/2013 09:30    Medications: Scheduled Meds: . acetaminophen  500 mg Oral TID  . alteplase  2 mg Intracatheter Once  . [START ON 10/13/2013] darbepoetin (ARANESP) injection - DIALYSIS  200 mcg Intravenous Q Wed-HD  . docusate sodium  100 mg Oral BID  . doxercalciferol  8 mcg Intravenous Q M,W,F-HD  . famotidine  20 mg Oral QHS  . feeding supplement (NEPRO CARB STEADY)  237 mL Oral QPC supper  . insulin aspart  0-9 Units Subcutaneous TID WC  . midodrine  5 mg Oral Q M,W,F-HD  . multivitamin  1 tablet Oral QHS  . pantoprazole (PROTONIX) IV  40 mg  Intravenous Q12H  . sevelamer carbonate  1,600 mg Oral TID WC   Time Spent: 25 minutes   LOS: 22 days   Chaya Jan M.D. [facility] 10/12/2013, 2:00 PM Pager: [phone]  If 7PM-7AM, please contact night-coverage  [email or web address] Password [email or web address]

## 2013-10-12 NOTE — Progress Notes (Signed)
[name], female   DOB: 1946-05-31, 68 y.o.   MRN: [id number]    Subjective:  Denies SSCP, palpitations or Dyspnea Discussed rational for cath Daughter Briana Gould to call me   Objective:  Filed Vitals:   10/11/13 1700 10/11/13 1725 10/11/13 2122 10/12/13 0503  BP: 152/72 148/65 150/70 143/77  Pulse: 75 78 80 76  Temp:  97.8 F (36.6 C) 98.6 F (37 C) 98.2 F (36.8 C)  TempSrc:  Oral Oral Oral  Resp: 16 16 16 17   Height:      Weight:      SpO2:  97% 98% 99%    Intake/Output from previous day:  Intake/Output Summary (Last 24 hours) at 10/12/13 1040 Last data filed at 10/11/13 1725  Gross per 24 hour  Intake    360 ml  Output   2200 ml  Net  -1840 ml    Physical Exam: Affect appropriate Chronically ill black female  HEENT: normal Neck supple with no adenopathy JVP normal no bruits no thyromegaly Lungs clear with no wheezing and good diaphragmatic motion Heart:  S1/S2 midl AS  murmur, no rub, gallop or click PMI normal Abdomen: benighn, BS positve, no tenderness, no AAA no bruit.  No HSM or HJR Left above knee amputation  No edema Neuro non-focal Skin warm and dry Dialysis catheter LUE    Lab Results: Basic Metabolic Panel:  Recent Labs  09/11/7199/31/15 1411 10/11/13 1243  NA 135* 136*  K 5.2 5.3  CL 91* 94*  CO2 22 25  GLUCOSE 209* 160*  BUN 101* 45*  CREATININE 7.79* 6.24*  CALCIUM 9.6 9.7  PHOS 3.7 3.8   Liver Function Tests:  Recent Labs  10/09/13 1411 10/11/13 1243  ALBUMIN 2.7* 3.0*   CBC:  Recent Labs  10/10/13 0530 10/11/13 1243  WBC 10.7* 11.4*  HGB 8.6* 8.9*  HCT 25.5* 27.5*  MCV 93.4 95.8  PLT 201 230    Imaging: No results found.  Cardiac Studies:  ECG:    Telemetry:  NSR no arrhythmia   Echo: Study Conclusions   09/20/13   - Procedure narrative: Transthoracic echocardiography. Image quality was adequate. The study was technically difficult. - Left ventricle: Overall image quality very poor but no discreet  RWMA seen The cavity size was normal. Wall thickness was increased in a pattern of moderate LVH. Systolic function was normal. The estimated ejection fraction was in the range of 50% to 55%. - Aortic valve: No good CW doppler may have mild AS but certainly not severe. - Mitral valve: Moderately calcified, moderately thickened annulus. Mild regurgitation. - Left atrium: The atrium was mildly dilated. - Right ventricle: The cavity size was mildly dilated. Wall thickness was normal. - Right atrium: The atrium was mildly dilated. - Tricuspid valve: Moderate regurgitation. - Pulmonary arteries: PA peak pressure: 69mm Hg (S). - Impressions: PA pressures elevated by TR velocity   Medications:   . acetaminophen  500 mg Oral TID  . alteplase  2 mg Intracatheter Once  . [START ON 10/13/2013] darbepoetin (ARANESP) injection - DIALYSIS  200 mcg Intravenous Q Wed-HD  . docusate sodium  100 mg Oral BID  . doxercalciferol  8 mcg Intravenous Q M,W,F-HD  . famotidine  20 mg Oral QHS  . feeding supplement (NEPRO CARB STEADY)  237 mL Oral QPC supper  . insulin aspart  0-9 Units Subcutaneous TID WC  . midodrine  5 mg Oral Q M,W,F-HD  . multivitamin  1 tablet Oral QHS  .  pantoprazole (PROTONIX) IV  40 mg Intravenous Q12H  . sevelamer carbonate  1,600 mg Oral TID WC     . sodium chloride Stopped (10/04/13 1600)    Assessment/Plan:  CAD:  Discussed with Dr Hyman Hopes and patient Reasonable to pursue cath to make sure she has not developed LM or large vessel  Epicardial disease since 4 years ago.  Appears that peritoneal dialysis will be frought with more issues than potential benefit and Surgery refuses to do catheter under general anesthesia with unknown heart issues.  Risks of cath including stroke discussed She is willing to proceed and will talk to daughter today  Given PA pressure estimate near by echo will do right heart Cath to document degree of pulmonary hypertension as this also bears on  medical Rx and risk of hemodynamic compromise On dialysis   Briana Gould 10/12/2013, 10:40 AM

## 2013-10-12 NOTE — Progress Notes (Signed)
Occupational Therapy Treatment Patient Details Name: Briana Gould MRN: [id number] DOB: [birth year] Today's Date: 10/12/2013 Time: 7829-5621 OT Time Calculation (min): 24 min  OT Assessment / Plan / Recommendation  History of present illness 68 year old female w/ ESRD, [facility] after being treated for UGIB while on coumadin for Right IJ thrombus stroke prevention. Was in usual state of health, getting stronger after her d/c. On 1/12 was at regular scheduled HD. About 20 minutes into HD she slumped over and became unresponsive. Initial rhythm was reported as PEA. CPR started. Reported 20 minutes CPR before ROSC, no drugs. Transferred to [facility] s/p arrest. Of note, she has PMHx of left BKA and wears a prosthesis.     OT comments  Pt making progress, but deconditioned and will benefit from CIR to increase level of independence to facilitate safe D/C home.  Rec CIR when medically stable.  Follow Up Recommendations  CIR;Supervision/Assistance - 24 hour    Barriers to Discharge       Equipment Recommendations  3 in 1 bedside comode    Recommendations for Other Services Rehab consult  Frequency Min 2X/week   Progress towards OT Goals Progress towards OT goals: Progressing toward goals  Plan Discharge plan remains appropriate    Precautions / Restrictions Precautions Precautions: Fall Restrictions Weight Bearing Restrictions: No   Pertinent Vitals/Pain no apparent distress     ADL  Lower Body Bathing: Minimal assistance Where Assessed - Lower Body Bathing: Supported sit to stand Lower Body Dressing: Minimal assistance Where Assessed - Lower Body Dressing: Supported sit to stand Transfers/Ambulation Related to ADLs: min A. Ambulating with RW ADL Comments: Limited by poor endurance    OT Diagnosis:    OT Problem List:   OT Treatment Interventions:     OT Goals(current goals can now be found in the care plan section) Acute Rehab OT Goals Patient Stated Goal: get  out of here OT Goal Formulation: With patient Time For Goal Achievement: 10/19/13 Potential to Achieve Goals: Good ADL Goals Pt Will Perform Grooming: with supervision;standing Pt Will Perform Upper Body Bathing: with supervision;sitting Pt Will Perform Lower Body Bathing: with supervision;sit to/from stand Pt Will Transfer to Toilet: with supervision;ambulating Pt Will Perform Toileting - Clothing Manipulation and hygiene: with supervision;sit to/from stand Pt Will Perform Tub/Shower Transfer: Shower transfer;with supervision;ambulating;shower seat;rolling walker  Visit Information  Last OT Received On: 10/12/13 History of Present Illness: 68 year old female w/ ESRD, [facility] after being treated for UGIB while on coumadin for Right IJ thrombus stroke prevention. Was in usual state of health, getting stronger after her d/c. On 1/12 was at regular scheduled HD. About 20 minutes into HD she slumped over and became unresponsive. Initial rhythm was reported as PEA. CPR started. Reported 20 minutes CPR before ROSC, no drugs. Transferred to [facility] s/p arrest. Of note, she has PMHx of left BKA and wears a prosthesis.      Subjective Data      Prior Functioning       Cognition  Cognition Arousal/Alertness: Awake/alert Behavior During Therapy: WFL for tasks assessed/performed Overall Cognitive Status: Within Functional Limits for tasks assessed    Mobility  Bed Mobility Overal bed mobility: Modified Independent Bed Mobility: Supine to Sit;Sit to Supine Transfers Overall transfer level: Needs assistance Equipment used: Rolling walker (2 wheeled) Transfers: Sit to/from [employer] Sit to Stand: Min assist    Exercises  Other Exercises Other Exercises: band  Balance Balance Overall balance assessment: Needs assistance Standing balance support: During functional activity Standing balance-Leahy Scale: Fair  End of Session OT - End of  Session Equipment Utilized During Treatment: Gait belt;Rolling walker Activity Tolerance: Patient tolerated treatment well Patient left: in chair;with call bell/phone within reach Nurse Communication: Mobility status  GO     Earnstine Meinders,HILLARY 10/12/2013, 2:48 PM [facility] Hospital[facility] Bruk [place], OTR/L  [phone] 10/12/2013

## 2013-10-12 NOTE — Progress Notes (Signed)
Physical Therapy Treatment Patient Details Name: Briana Gould MRN: [id number] DOB: Jan 14, 1946 Today's Date: 10/12/2013 Time: 4098-1191 PT Time Calculation (min): 27 min  PT Assessment / Plan / Recommendation  History of Present Illness 68 year old female w/ ESRD, [facility] after being treated for UGIB while on coumadin for Right IJ thrombus stroke prevention. Was in usual state of health, getting stronger after her d/c. On 1/12 was at regular scheduled HD. About 20 minutes into HD she slumped over and became unresponsive. Initial rhythm was reported as PEA. CPR started. Reported 20 minutes CPR before ROSC, no drugs. Transferred to [facility] s/p arrest. Of note, she has PMHx of left BKA and wears a prosthesis.     PT Comments   Pt is progressing well, but slowly with her mobility.  She still reports quite a bit of difficulty getting out of lower chairs and she did not have this issue PTA.  I continue to endorse CIR level therapies for her before d/c home and she continues to be agreeable to this.  Goals updated based on progress.    Follow Up Recommendations  CIR     Does the patient have the potential to tolerate intense rehabilitation    Yes  Barriers to Discharge   None      Equipment Recommendations  None recommended by PT    Recommendations for Other Services   None  Frequency Min 3X/week   Progress towards PT Goals Progress towards PT goals: Progressing toward goals  Plan Current plan remains appropriate    Precautions / Restrictions Precautions Precautions: Fall Restrictions Weight Bearing Restrictions: No   Pertinent Vitals/Pain See vitals flow sheet.    Mobility  Bed Mobility Overal bed mobility: Modified Independent Bed Mobility: Supine to Sit;Sit to Supine Transfers Overall transfer level: Needs assistance Equipment used: Rolling walker (2 wheeled) Transfers: Sit to/from Stand Sit to Stand: Min guard;From elevated surface General transfer comment:  min guard assist for safety and balance during transition from sitting to stand from elevated bed.  Per pt report she could not get out of the recliner chair earlier today without significant help from the NT.   Ambulation/Gait Ambulation/Gait assistance: Min assist Ambulation Distance (Feet): 75 Feet Assistive device: Rolling walker (2 wheeled) Gait Pattern/deviations: Step-through pattern;Trunk flexed Gait velocity: decreased Gait velocity interpretation: <1.8 ft/sec, indicative of risk for recurrent falls General Gait Details: Pt already had prosthesis donned and was sitting EOB.  Assist needed for gait for balance and safety when she fatigued the second half of her walk.      Exercises General Exercises - Lower Extremity Long Arc Quad: AROM;Both;10 reps;Seated Hip ABduction/ADduction: AROM;Both;10 reps;Seated Hip Flexion/Marching: AROM;Both;10 reps;Seated Toe Raises: AROM;Right;10 reps;Seated Heel Raises: AROM;Right;10 reps;Seated Other Exercises Other Exercises: band exercises for hip abduction and knee extension (OT gave pt band for UE exercises- green) Other Exercises: HEP written exercises given with instructions on reps and frequency.       PT Goals (current goals can now be found in the care plan section) Acute Rehab PT Goals Patient Stated Goal: to try to get strong enough to go home PT Goal Formulation: With patient Time For Goal Achievement: 10/26/13 Potential to Achieve Goals: Good  Visit Information  Last PT Received On: 10/12/13 Assistance Needed: +1 History of Present Illness: 68 year old female w/ ESRD, [facility] after being treated for UGIB while on coumadin for Right IJ thrombus stroke prevention. Was in usual state of  health, getting stronger after her d/c. On 1/12 was at regular scheduled HD. About 20 minutes into HD she slumped over and became unresponsive. Initial rhythm was reported as PEA. CPR started. Reported 20 minutes CPR before  ROSC, no drugs. Transferred to [facility] s/p arrest. Of note, she has PMHx of left BKA and wears a prosthesis.      Subjective Data  Subjective: Pt reports she is still having difficulty getting out of chairs on her own.  Her right leg is still very edemous.  Patient Stated Goal: to try to get strong enough to go home   Cognition  Cognition Arousal/Alertness: Awake/alert Behavior During Therapy: WFL for tasks assessed/performed Overall Cognitive Status: Within Functional Limits for tasks assessed    Balance  Balance Overall balance assessment: Needs assistance Sitting-balance support: Feet supported Sitting balance-Leahy Scale: Good Standing balance support: Bilateral upper extremity supported Standing balance-Leahy Scale: Fair  End of Session PT - End of Session Equipment Utilized During Treatment: Gait belt;Other (comment) (left leg prosthesis. ) Activity Tolerance: Patient limited by fatigue Patient left: in bed;with call bell/phone within reach;Other (comment) (seated EOB)     Kyleigha Markert B. Arleen Bar, PT, DPT [phone]   10/12/2013, 5:55 PM

## 2013-10-12 NOTE — Progress Notes (Signed)
I continue to follow pt's progess with therapy as medical workup continues. [phone]

## 2013-10-13 ENCOUNTER — Encounter (HOSPITAL_COMMUNITY)
Admission: EM | Disposition: A | Discharge status: Rehabilitation Facility | Payer: Self-pay | Source: Home / Self Care | Attending: Internal Medicine

## 2013-10-13 LAB — CBC
HEMATOCRIT: 28.4 % — AB (ref 36.0–46.0)
Hemoglobin: 9.2 g/dL — ABNORMAL LOW (ref 12.0–15.0)
MCH: 31.5 pg (ref 26.0–34.0)
MCHC: 32.4 g/dL (ref 30.0–36.0)
MCV: 97.3 fL (ref 78.0–100.0)
PLATELETS: 213 10*3/uL (ref 150–400)
RBC: 2.92 MIL/uL — ABNORMAL LOW (ref 3.87–5.11)
RDW: 18.5 % — AB (ref 11.5–15.5)
WBC: 10 10*3/uL (ref 4.0–10.5)

## 2013-10-13 LAB — GLUCOSE, CAPILLARY
GLUCOSE-CAPILLARY: 120 mg/dL — AB (ref 70–99)
Glucose-Capillary: 159 mg/dL — ABNORMAL HIGH (ref 70–99)
Glucose-Capillary: 144 mg/dL — ABNORMAL HIGH (ref 70–99)
Glucose-Capillary: 162 mg/dL — ABNORMAL HIGH (ref 70–99)

## 2013-10-13 LAB — RENAL FUNCTION PANEL
ALBUMIN: 3.1 g/dL — AB (ref 3.5–5.2)
BUN: 25 mg/dL — ABNORMAL HIGH (ref 6–23)
CHLORIDE: 97 meq/L (ref 96–112)
CO2: 25 mEq/L (ref 19–32)
Calcium: 9.8 mg/dL (ref 8.4–10.5)
Creatinine, Ser: 5.61 mg/dL — ABNORMAL HIGH (ref 0.50–1.10)
GFR, EST AFRICAN AMERICAN: 8 mL/min — AB (ref >90)
GFR, EST NON AFRICAN AMERICAN: 7 mL/min — AB (ref >90)
Glucose, Bld: 180 mg/dL — ABNORMAL HIGH (ref 70–99)
PHOSPHORUS: 4.1 mg/dL (ref 2.3–4.6)
Potassium: 4.1 mEq/L (ref 3.7–5.3)
SODIUM: 139 meq/L (ref 137–147)

## 2013-10-13 SURGERY — LEFT AND RIGHT HEART CATHETERIZATION WITH CORONARY ANGIOGRAM
Anesthesia: LOCAL

## 2013-10-13 MED ORDER — HEPARIN SODIUM (PORCINE) 1000 UNIT/ML DIALYSIS
1000.0000 [IU] | INTRAMUSCULAR | Status: DC | PRN
  Filled 2013-10-13: qty 1

## 2013-10-13 MED ORDER — SODIUM CHLORIDE 0.9 % IV SOLN: 100.0000 mL | INTRAVENOUS | Status: DC | PRN

## 2013-10-13 MED ORDER — DARBEPOETIN ALFA-POLYSORBATE 200 MCG/0.4ML IJ SOLN
INTRAMUSCULAR | Status: AC
  Administered 2013-10-13: 200 ug via INTRAVENOUS
  Filled 2013-10-13: qty 0.4

## 2013-10-13 MED ORDER — PENTAFLUOROPROP-TETRAFLUOROETH EX AERO: 1.0000 "application " | INHALATION_SPRAY | CUTANEOUS | Status: DC | PRN

## 2013-10-13 MED ORDER — ALTEPLASE 2 MG IJ SOLR: 2.0000 mg | Freq: Once | INTRAMUSCULAR | Status: DC | PRN

## 2013-10-13 MED ORDER — NEPRO/CARBSTEADY PO LIQD
237.0000 mL | ORAL | Status: DC | PRN
Start: 2013-10-13 — End: 2013-10-13

## 2013-10-13 MED ORDER — SODIUM CHLORIDE 0.9 % IV SOLN
100.0000 mL | INTRAVENOUS | Status: DC | PRN
Start: 2013-10-13 — End: 2013-10-15

## 2013-10-13 MED ORDER — LIDOCAINE HCL (PF) 1 % IJ SOLN
5.0000 mL | INTRAMUSCULAR | Status: DC | PRN
  Filled 2013-10-13: qty 5

## 2013-10-13 MED ORDER — NEPRO/CARBSTEADY PO LIQD: 237.0000 mL | ORAL | Status: DC | PRN

## 2013-10-13 MED ORDER — LIDOCAINE-PRILOCAINE 2.5-2.5 % EX CREA: 1.0000 "application " | TOPICAL_CREAM | CUTANEOUS | Status: DC | PRN

## 2013-10-13 MED ORDER — HEPARIN SODIUM (PORCINE) 1000 UNIT/ML DIALYSIS
1000.0000 [IU] | INTRAMUSCULAR | Status: DC | PRN
Start: 2013-10-13 — End: 2013-10-13

## 2013-10-13 MED ORDER — DOXERCALCIFEROL 4 MCG/2ML IV SOLN
INTRAVENOUS | Status: AC
  Administered 2013-10-13: 8 ug via INTRAVENOUS
  Filled 2013-10-13: qty 4

## 2013-10-13 MED ORDER — LIDOCAINE-PRILOCAINE 2.5-2.5 % EX CREA
1.0000 "application " | TOPICAL_CREAM | CUTANEOUS | Status: DC | PRN
  Filled 2013-10-13: qty 5

## 2013-10-13 MED ORDER — ALTEPLASE 2 MG IJ SOLR
2.0000 mg | Freq: Once | INTRAMUSCULAR | Status: AC | PRN
  Filled 2013-10-13: qty 2

## 2013-10-13 MED ORDER — LIDOCAINE HCL (PF) 1 % IJ SOLN: 5.0000 mL | INTRAMUSCULAR | Status: DC | PRN

## 2013-10-13 MED ORDER — SODIUM CHLORIDE 0.9 % IV SOLN
100.0000 mL | INTRAVENOUS | Status: DC | PRN
Start: 2013-10-13 — End: 2013-10-13

## 2013-10-13 MED ORDER — NEPRO/CARBSTEADY PO LIQD
237.0000 mL | ORAL | Status: DC | PRN
  Filled 2013-10-13: qty 237

## 2013-10-13 MED ORDER — HEPARIN SODIUM (PORCINE) 1000 UNIT/ML DIALYSIS: 1000.0000 [IU] | INTRAMUSCULAR | Status: DC | PRN

## 2013-10-13 NOTE — Progress Notes (Addendum)
I have seen and examined this patient and agree with the plan of care  Seen on dialysis with no issues today Briana Gould W 10/13/2013, 10:20 AM

## 2013-10-13 NOTE — Progress Notes (Signed)
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 Readmitted for PEA arrest while on dialysis. Patient cardiac arrest etiology is not fully determined, question carotid stenosis, severe pulm HTN or severe CAD may be contributing when she is becoming hypotensivie during dialysis sessions, cardiac arrytmia. Plan is for intracranial angiogram and or possible stent 1-29 to see if that help with circulation. Not safe to discharge patient to outpatient dialysis. Appreciate Dr Arlean Hopping help. Stent had to be cancelled due to upper GI Bleed on 1/30 necessitating we stop ASA and Brillinta. Cards has been consulted and is considering further cardiac work up on this patient and even the possibility of a PPM.  Assessment/Plan:   -A fib: RVR with subsequent cardiac pause 6.8 sec on 1-23 after dialysis. Patient Blood pressure decrease during dialysis 1-23. HR increased. She received IV bolus. Cardiology consulted. She was on coumadin at some point but this was stop due to bleed. Difficult situation with anticoagulation. Patient converted spontaneously. She had a pause of 6.8 sec. She didn't received any medications.  No good candidate for pacer per cardio evaluation. No further evaluation per cardio.  -now sinus rhythm.  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  - Multiple times - etiology not fully determined, but suspect related to cerebral hypoperfusion during HD with volume and BP shifts. -No further evaluation per cardio.   Carotid artery stenosis - 1-39% stenosis involving the left internal  carotid artery. - Findings consistent with 60-79% right ICA stenosis  - interventional radiology has been consulted, cerebral angiogram done  by Dr. Corliss Skains.  -Severe stenosis of LT ICA pet cavernous junction of 95% , 60 % stenosis of RT ICA prox.. 50 % steosis of LT ICA petrous seg. Occluded RT VBJ -Plan for intracranial angiogram and or possible stent 1-29. -Per Dr. Corliss Skains: resistant to plavix. Switch to Brilinta and reschedule angiogram for outpatient in in 2 to 3 weeks. Needs to follow up with Dr Corliss Skains.   Upper GI Bleed -1/30: had a episode of coffee ground emesis (aprox 1 L). -Will DC ASA and Brillinta. -Continue with PPI.  -Had EGD in 12/14 with findings of esophagitis, antral and duodenal ulcers. -Hemodynamically stable.  -Leukocytosis; Resolved  -Chest pain: resolved.  -Musculoskeletal likely due to CPR with chest wall tenderness improving:  - Placed her on tramadol as needed and Tylenol 500mg  TID.   -ESRD (end stage renal disease)  - per nephrology.  -Will hold HD 1/30 due to acute episode of GI Bleed. -Exploring alternate methods to HD: surgery has refused to place PD catheter due to risks with anesthesia. -Dr Web to discussed options with patient 2-5.   -Acute Respiratory failure with hypoxia/ Pulmonary edema/ diastolic CHF  - s/p arrest  - nephrology team to manage fluid balance    Severe Pulmonary HTN with moderate TR, CAD  - cath in 2011 revealed diffuse distal disease   Right IJ thrombus  -found on a previous admission- not a candidate for anticoagulation due to PUD with bleed in 12/14 in setting of Coumadin  PUD - cont Pepcid   Hypertension  - Hydralazine  PRN. Careful with Hypotension.   Encephalopathy acute - likely due to anoxia secondary to cardiac arrest- may have new cognitive baseline-  - Levaquin started on 1/16- continue for 5 days . Finished course.  -Resolved.   Severe PAD with left BKA.   DVT Prophylaxis: SCDs  Code Status: Full  code  Family Communication: Care discussed with patient.   Disposition: CIR evaluation.   SIGNIFICANT EVENTS / STUDIES:  1/12 cardiac arrest/ PEA at HD. 20 minutes estimated to ROSC  1/12 Echocardiogram: LVEF 50-55%. No RWMAs. Moderate LVH. Probably mild AS. Mild MR. RVSP est 69 mmHg  1/12 CT head: Age uncertain small infarct mid pons. Small infarct in this area may be recent.  1/13 Cognition much improved. Failed SBT  1/15 Extubated successfully  1/20: Cerebral angiogram done 1/30: Upper GI Bleed (coffee-ground emesis).   Subjective: No emesis. Doing well. Decline Cath, pacemaker.   Objective: Weight change: -5.045 kg (-11 lb 2 oz)  Intake/Output Summary (Last 24 hours) at 10/13/13 1636 Last data filed at 10/13/13 1247  Gross per 24 hour  Intake      0 ml  Output   2891 ml  Net  -2891 ml   Blood pressure 152/70, pulse 78, temperature 97.8 F (36.6 C), temperature source Oral, resp. rate 24, height 5\' 6"  (1.676 m), weight 82.4 kg (181 lb 10.5 oz), SpO2 100.00%.  Physical Exam: General: Alert and awake, oriented. CVS: S1-S2 clear Chest: CTAB Abdomen: soft nontender, nondistended, normal bowel sounds  Extremities: no cyanosis, clubbing left BKA,   Lab Results: Basic Metabolic Panel:  Recent Labs Lab 10/11/13 1243 10/13/13 0835  NA 136* 139  K 5.3 4.1  CL 94* 97  CO2 25 25  GLUCOSE 160* 180*  BUN 45* 25*  CREATININE 6.24* 5.61*  CALCIUM 9.7 9.8  PHOS 3.8 4.1   Liver Function Tests:  Recent Labs Lab 10/07/13 0007  10/11/13 1243 10/13/13 0835  AST 19  --   --   --   ALT 11  --   --   --   ALKPHOS 91  --   --   --   BILITOT 0.3  --   --   --   PROT 6.5  --   --   --   ALBUMIN 2.7*  < > 3.0* 3.1*  < > = values in this interval not displayed. No results found for this basename: LIPASE, AMYLASE,  in the last 168 hours No results found for this basename: AMMONIA,  in the last 168 hours CBC:  Recent Labs Lab 10/07/13 0007  10/11/13 1243 10/13/13 0834   WBC 8.6  < > 11.4* 10.0  NEUTROABS 6.3  --   --   --   HGB 8.4*  < > 8.9* 9.2*  HCT 25.7*  < > 27.5* 28.4*  MCV 96.3  < > 95.8 97.3  PLT 243  < > 230 213  < > = values in this interval not displayed. Cardiac Enzymes: No results found for this basename: CKTOTAL, CKMB, CKMBINDEX, TROPONINI,  in the last 168 hours BNP: No components found with this basename: POCBNP,  CBG:  Recent Labs Lab 10/12/13 0809 10/12/13 1143 10/12/13 1653 10/12/13 2122 10/13/13 0747  GLUCAP 148* 167* 136* 153* 159*     Micro Results: No results found for this or any previous visit (from the past 240 hour(s)).  Studies/Results: Ct Head Wo Contrast  09/20/2013  CLINICAL DATA:  Altered mental status ; status post cardiopulmonary resuscitation  EXAM: CT HEAD WITHOUT CONTRAST  TECHNIQUE: Contiguous axial images were obtained from the base of the skull through the vertex without intravenous contrast.  COMPARISON:  None.  FINDINGS: There is age related volume loss. There is no demonstrable mass, hemorrhage, extra-axial fluid collection, or midline shift. There is mild small vessel disease in the centra semiovale bilaterally. There is decreased attenuation in the midline of the upper to mid pons which may represent a small recent infarct in this area. No other acute appearing infarct is appreciable.  Bony calvarium appears intact.  The mastoid air cells are clear.  IMPRESSION: Age uncertain small infarct mid pons. Small infarct in this area may be recent.  There is underlying age related volume loss with mild periventricular small vessel disease. There is no intracranial mass or hemorrhage. No gray-white compartment edema is appreciable on this study.   Electronically Signed   By: Bretta Bang M.D.   On: 09/20/2013 12:54   Dg Chest Port 1 View  09/25/2013   CLINICAL DATA:  Edema after dialysis.  EXAM: PORTABLE CHEST - 1 VIEW  COMPARISON:  One-view chest 09/24/2013.  FINDINGS: Cardiac enlargement is stable. A left  IJ line is stable in position. Interstitial edema is improved. No focal airspace consolidation is evident.  IMPRESSION: 1. Improved interstitial edema. 2. Stable cardiomegaly.   Electronically Signed   By: Gennette Pac M.D.   On: 09/25/2013 21:55   Dg Chest Port 1 View  09/24/2013   CLINICAL DATA:  Respiratory failure  EXAM: PORTABLE CHEST - 1 VIEW  COMPARISON:  09/23/2013  FINDINGS: Interval tracheal and esophageal extubation. Left IJ catheter in stable position, tip at the superior cavoatrial junction.  Unchanged cardiomegaly and pulmonary edema. These changes could obscure superimposed infection. No evidence of increased pleural fluid. No pneumothorax.  IMPRESSION: 1. Stable lung volumes after tracheal extubation. 2. Unchanged pulmonary edema, which could obscure superimposed consolidation.   Electronically Signed   By: Tiburcio Pea M.D.   On: 09/24/2013 06:23   Dg Chest Port 1 View  09/23/2013   CLINICAL DATA:  Respiratory failure.  EXAM: PORTABLE CHEST - 1 VIEW  COMPARISON:  09/22/2013.  FINDINGS: Endotracheal tube, left IJ line, NG tube in stable position. Persistent cardiomegaly and pulmonary venous congestion present. Interstitial prominence present. These findings consistent with congestive heart failure. Left base atelectasis and/or infiltrate present. Left pleural effusion has improved. No pneumothorax.  IMPRESSION: 1. Stable line and tube positions. 2. Findings consistent with congestive heart failure with pulmonary interstitial edema again noted. Left pleural effusion has improved. 3. Left lower lobe atelectasis and/or infiltrate.   Electronically Signed   By: Maisie Fus  Register   On: 09/23/2013 07:36   Dg Chest Port 1 View  09/22/2013   CLINICAL DATA:  Respiratory failure.  EXAM: PORTABLE CHEST - 1 VIEW  COMPARISON:  Chest radiograph September 21, 2013  FINDINGS: Endotracheal tube tip projects 4.2 cm above the carina. Left internal jugular central venous catheter with distal tip at the  cavoatrial junction. Nasogastric tube past the gastroesophageal junction region code though, tip is not visualized. No pneumothorax.  Stable cardiomegaly, worsening interstitial edema, with minimal patchy airspace opacity in left lung base, unchanged. Suspected small left pleural effusion. Soft tissue planes and included osseous structures are unchanged.  IMPRESSION: No apparent change in life-support lines.  Stable cardiomegaly with slightly worsening interstitial edema. Retrocardiac airspace opacity may reflect atelectasis or confluent edema with suspected  small left pleural effusion.   Electronically Signed   By: Awilda Metroourtnay  Bloomer   On: 09/22/2013 06:31   Dg Chest Port 1 View  09/21/2013   CLINICAL DATA:  Respiratory distress.  Intubated patient.  EXAM: PORTABLE CHEST - 1 VIEW  COMPARISON:  09/20/2013  FINDINGS: Endotracheal tube tip lies 3 cm above the carina. Nasogastric tube passes below the diaphragm well into the stomach. Left internal jugular central venous line tip lies the caval atrial junction.  Mild hazy airspace opacity is noted in the medial right lung base. This may reflect atelectasis. Infiltrate is possible. Lungs are otherwise clear. No pleural effusion or pneumothorax  IMPRESSION: 1. Support apparatus is stable in well positioned. 2. Mild hazy opacity in the medial right lung base, likely atelectasis. This potentially could reflect a small area of infiltrate or even asymmetric edema. Lungs are otherwise clear. Findings are similar to the prior exam allowing for differences in patient positioning and technique.   Electronically Signed   By: Amie Portlandavid  Ormond M.D.   On: 09/21/2013 12:27   Dg Chest Port 1 View  09/20/2013   CLINICAL DATA:  Post IJ placement  EXAM: PORTABLE CHEST - 1 VIEW  COMPARISON:  Prior chest x-ray 09/20/2013 at 9:19 a.m.  FINDINGS: Interval placement of a left IJ approach central venous catheter. The tip of the catheter is in good position at the superior cavoatrial junction.  No evidence of complicating pneumothorax. The patient remains intubated. The tip of the endotracheal tube is 4 cm above the carina. A nasogastric tube is present, the tip lies off the field of view presumably within the stomach. External defibrillator pads and metallic artifacts overlie the chest. Stable cardiomegaly, very low inspiratory volumes and mild pulmonary edema.  IMPRESSION: 1. New left IJ approach central venous catheter with the tip at the superior cavoatrial junction. No evidence of pneumothorax or other complication. 2. Otherwise, no significant interval change in the appearance of the chest.   Electronically Signed   By: Malachy MoanHeath  McCullough M.D.   On: 09/20/2013 11:48   Dg Chest Portable 1 View  09/20/2013   CLINICAL DATA:  Status post intubation.  EXAM: PORTABLE CHEST - 1 VIEW  COMPARISON:  None.  FINDINGS: 0919 hrs. Endotracheal tube tip is 3.8 cm above the base of the carina. The NG tube passes into the stomach although the distal tip position is not included on the film. Lung volumes are low. The cardio pericardial silhouette is enlarged. Diffuse interstitial opacity is associated with vascular congestion. Telemetry leads overlie the chest. No evidence for pneumothorax or substantial pleural effusion.  IMPRESSION: Low volume film with enlargement of the cardiopericardial silhouette. Vascular congestion with interstitial opacities suggest associated edema.   Electronically Signed   By: Kennith CenterEric  Mansell M.D.   On: 09/20/2013 09:30    Medications: Scheduled Meds: . acetaminophen  500 mg Oral TID  . alteplase  2 mg Intracatheter Once  . darbepoetin (ARANESP) injection - DIALYSIS  200 mcg Intravenous Q Wed-HD  . docusate sodium  100 mg Oral BID  . doxercalciferol  8 mcg Intravenous Q M,W,F-HD  . famotidine  20 mg Oral QHS  . feeding supplement (NEPRO CARB STEADY)  237 mL Oral QPC supper  . insulin aspart  0-9 Units Subcutaneous TID WC  . midodrine  5 mg Oral Q M,W,F-HD  . multivitamin  1  tablet Oral QHS  . pantoprazole (PROTONIX) IV  40 mg Intravenous Q12H  . sevelamer carbonate  1,600 mg Oral TID  WC   Time Spent: 25 minutes   LOS: 23 days   REGALADO,BELKYS M.D. [facility] 10/13/2013, 4:36 PM Pager: [phone].  If 7PM-7AM, please contact night-coverage [email or web address] Password [email or web address]

## 2013-10-13 NOTE — Progress Notes (Signed)
The Chaplain offered emotional and spiritual support to the patient today. The Chaplain had arrived in the room just moments after the patient had gotten back from her chemo-dialysis and the patient informed the Chaplain that she is a woman of faith but requested more prayer for her physical condition and the Chaplain prayed for the patient. The patient also informed the Chaplain that she would like more Chaplains to come by and pay her a visit sometime as she copes with her health and mental challenges. The Chaplain informed the patient that her request will be granted and that we are here to help you get through life's toughest crisis.  Chaplain Bryson HaKwan Tedi Hughson

## 2013-10-13 NOTE — Progress Notes (Signed)
[name], female   DOB: 11-Nov-1945, 68 y.o.   MRN: [id number]   Pt had been scheduled for L ICA angioplasty/stent Pt has many issues: resistant to Plavix--changed to Brilinta; developed UGI bleed GI MD dc'd Brilinta   Now on PPI  Pt also had had dialysis issues and cardiac arrest  We will plan for procedure later when pt is stable We will monitor chart

## 2013-10-13 NOTE — Progress Notes (Signed)
[name], female   DOB: Jan 15, 1946, 68 y.o.   MRN: [id number]    Subjective:  Comfortable on dialysis Confirms not wanting cath  Objective:  Filed Vitals:   10/13/13 1000 10/13/13 1030 10/13/13 1100 10/13/13 1117  BP: 153/68 126/51 130/51 127/53  Pulse: 78 75 73 70  Temp:      TempSrc:      Resp: 15 16 20 16   Height:      Weight:      SpO2:        Intake/Output from previous day: No intake or output data in the 24 hours ending 10/13/13 1146  Physical Exam: Affect appropriate Chronically ill black female  HEENT: normal Neck supple with no adenopathy JVP normal no bruits no thyromegaly Lungs clear with no wheezing and good diaphragmatic motion Heart:  S1/S2 midl AS  murmur, no rub, gallop or click PMI normal Abdomen: benighn, BS positve, no tenderness, no AAA no bruit.  No HSM or HJR Left above knee amputation  No edema Neuro non-focal Skin warm and dry Dialysis catheter LUE    Lab Results: Basic Metabolic Panel:  Recent Labs  40/98/1100/10/24 1243 10/13/13 0835  NA 136* 139  K 5.3 4.1  CL 94* 97  CO2 25 25  GLUCOSE 160* 180*  BUN 45* 25*  CREATININE 6.24* 5.61*  CALCIUM 9.7 9.8  PHOS 3.8 4.1   Liver Function Tests:  Recent Labs  10/11/13 1243 10/13/13 0835  ALBUMIN 3.0* 3.1*   CBC:  Recent Labs  10/11/13 1243 10/13/13 0834  WBC 11.4* 10.0  HGB 8.9* 9.2*  HCT 27.5* 28.4*  MCV 95.8 97.3  PLT 230 213    Imaging: No results found.  Cardiac Studies:  ECG:    Telemetry:  NSR no arrhythmia   Echo: Study Conclusions   09/20/13   - Procedure narrative: Transthoracic echocardiography. Image quality was adequate. The study was technically difficult. - Left ventricle: Overall image quality very poor but no discreet RWMA seen The cavity size was normal. Wall thickness was increased in a pattern of moderate LVH. Systolic function was normal. The estimated ejection fraction was in the range of 50% to 55%. - Aortic valve: No good CW  doppler may have mild AS but certainly not severe. - Mitral valve: Moderately calcified, moderately thickened annulus. Mild regurgitation. - Left atrium: The atrium was mildly dilated. - Right ventricle: The cavity size was mildly dilated. Wall thickness was normal. - Right atrium: The atrium was mildly dilated. - Tricuspid valve: Moderate regurgitation. - Pulmonary arteries: PA peak pressure: 69mm Hg (S). - Impressions: PA pressures elevated by TR velocity   Medications:   . acetaminophen  500 mg Oral TID  . alteplase  2 mg Intracatheter Once  . darbepoetin (ARANESP) injection - DIALYSIS  200 mcg Intravenous Q Wed-HD  . docusate sodium  100 mg Oral BID  . doxercalciferol  8 mcg Intravenous Q M,W,F-HD  . famotidine  20 mg Oral QHS  . feeding supplement (NEPRO CARB STEADY)  237 mL Oral QPC supper  . insulin aspart  0-9 Units Subcutaneous TID WC  . midodrine  5 mg Oral Q M,W,F-HD  . multivitamin  1 tablet Oral QHS  . pantoprazole (PROTONIX) IV  40 mg Intravenous Q12H  . sevelamer carbonate  1,600 mg Oral TID WC     . sodium chloride Stopped (10/04/13 1600)    Assessment/Plan:  CAD:  Patient and daughter have decided they do not want to pursue cath.  I can understand this.  Has done well on dialysis Last 2.5 weeks.  If issues in future they are willing to have palliative/comfort care.  She looks much better than 3 weeks ag And her and her daughter are clearly making informed decisions.  Will sign off   Charlton Haws 10/13/2013, 11:46 AM

## 2013-10-14 LAB — GLUCOSE, CAPILLARY
GLUCOSE-CAPILLARY: 136 mg/dL — AB (ref 70–99)
GLUCOSE-CAPILLARY: 176 mg/dL — AB (ref 70–99)
Glucose-Capillary: 207 mg/dL — ABNORMAL HIGH (ref 70–99)
Glucose-Capillary: 141 mg/dL — ABNORMAL HIGH (ref 70–99)

## 2013-10-14 NOTE — Progress Notes (Signed)
I met with pt at beside to discuss overall goals for admission to inpt rehab. I will follow up with Nephrology service and Rehabilitation MD to assess possibility of admission to inpt rehab. RN CM is aware. I will also contact pt's daughter, Hinton Dyer, to discuss. [phone]

## 2013-10-14 NOTE — Progress Notes (Signed)
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 Readmitted for PEA arrest while on dialysis. Patient cardiac arrest etiology is not fully determined, question carotid stenosis, severe pulm HTN or severe CAD may be contributing when she is becoming hypotensivie during dialysis sessions, cardiac arrytmia. Plan is for intracranial angiogram and or possible stent 1-29 to see if that help with circulation. Not safe to discharge patient to outpatient dialysis. Appreciate Dr Arlean Hopping help. Stent had to be cancelled due to upper GI Bleed on 1/30 necessitating we stop ASA and Brillinta. Cards has been consulted and is considering further cardiac work up on this patient and even the possibility of a PPM.  Assessment/Plan:   -A fib: RVR with subsequent cardiac pause 6.8 sec on 1-23 after dialysis. Patient Blood pressure decrease during dialysis 1-23. HR increased. She received IV bolus. Cardiology consulted. She was on coumadin at some point but this was stop due to bleed. Difficult situation with anticoagulation. Patient converted spontaneously. She had a pause of 6.8 sec. She didn't received any medications.  No good candidate for pacer per cardio evaluation. No further evaluation per cardio.  -now sinus rhythm.  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  - Multiple times - etiology not fully determined, but suspect related to cerebral hypoperfusion during HD with volume and BP shifts. -No further evaluation per cardio.   Carotid artery stenosis - 1-39% stenosis involving the left internal  carotid artery. - Findings consistent with 60-79% right ICA stenosis  - interventional radiology has been consulted, cerebral angiogram done  by Dr. Corliss Skains.  -Severe stenosis of LT ICA pet cavernous junction of 95% , 60 % stenosis of RT ICA prox.. 50 % steosis of LT ICA petrous seg. Occluded RT VBJ -Plan for intracranial angiogram and or possible stent 1-29. -Per Dr. Corliss Skains: resistant to plavix. Switch to Brilinta and reschedule angiogram for outpatient in in 2 to 3 weeks. Needs to follow up with Dr Corliss Skains.   Upper GI Bleed -1/30: had a episode of coffee ground emesis (aprox 1 L). -Will DC ASA and Brillinta. -Continue with PPI.  -Had EGD in 12/14 with findings of esophagitis, antral and duodenal ulcers. -Hemodynamically stable.  -Leukocytosis; Resolved  -Chest pain: resolved.  -Musculoskeletal likely due to CPR with chest wall tenderness improving:  - Placed her on tramadol as needed and Tylenol 500mg  TID.   -ESRD (end stage renal disease)  - per nephrology.  -Will hold HD 1/30 due to acute episode of GI Bleed. -Exploring alternate methods to HD: surgery has refused to place PD catheter due to risks with anesthesia. -Dr Web to discussed options with patient 2-5.   -Acute Respiratory failure with hypoxia/ Pulmonary edema/ diastolic CHF  - s/p arrest  - nephrology team to manage fluid balance    Severe Pulmonary HTN with moderate TR, CAD  - cath in 2011 revealed diffuse distal disease   Right IJ thrombus  -found on a previous admission- not a candidate for anticoagulation due to PUD with bleed in 12/14 in setting of Coumadin  PUD - cont Pepcid   Hypertension  - Hydralazine  PRN. Careful with Hypotension.   Encephalopathy acute - likely due to anoxia secondary to cardiac arrest- may have new cognitive baseline-  - Levaquin started on 1/16- continue for 5 days . Finished course.  -Resolved.   Severe PAD with left BKA.   DVT Prophylaxis: SCDs  Code Status: Full  code  Family Communication: Care discussed with patient.   Disposition: CIR evaluation.   SIGNIFICANT EVENTS / STUDIES:  1/12 cardiac arrest/ PEA at HD. 20 minutes estimated to ROSC  1/12 Echocardiogram: LVEF 50-55%. No RWMAs. Moderate LVH. Probably mild AS. Mild MR. RVSP est 69 mmHg  1/12 CT head: Age uncertain small infarct mid pons. Small infarct in this area may be recent.  1/13 Cognition much improved. Failed SBT  1/15 Extubated successfully  1/20: Cerebral angiogram done 1/30: Upper GI Bleed (coffee-ground emesis).   Subjective: No emesis. Doing well. Decline Cath, pacemaker.  Agree to CIR.   Objective: Weight change: 2.745 kg (6 lb 0.8 oz) No intake or output data in the 24 hours ending 10/14/13 1538 Blood pressure 132/75, pulse 78, temperature 98.5 F (36.9 C), temperature source Oral, resp. rate 18, height 5\' 6"  (1.676 m), weight 81.9 kg (180 lb 8.9 oz), SpO2 97.00%.  Physical Exam: General: Alert and awake, oriented. CVS: S1-S2 clear Chest: CTAB Abdomen: soft nontender, nondistended, normal bowel sounds  Extremities: no cyanosis, clubbing left BKA,   Lab Results: Basic Metabolic Panel:  Recent Labs Lab 10/11/13 1243 10/13/13 0835  NA 136* 139  K 5.3 4.1  CL 94* 97  CO2 25 25  GLUCOSE 160* 180*  BUN 45* 25*  CREATININE 6.24* 5.61*  CALCIUM 9.7 9.8  PHOS 3.8 4.1   Liver Function Tests:  Recent Labs Lab 10/11/13 1243 10/13/13 0835  ALBUMIN 3.0* 3.1*   No results found for this basename: LIPASE, AMYLASE,  in the last 168 hours No results found for this basename: AMMONIA,  in the last 168 hours CBC:  Recent Labs Lab 10/11/13 1243 10/13/13 0834  WBC 11.4* 10.0  HGB 8.9* 9.2*  HCT 27.5* 28.4*  MCV 95.8 97.3  PLT 230 213   Cardiac Enzymes: No results found for this basename: CKTOTAL, CKMB, CKMBINDEX, TROPONINI,  in the last 168 hours BNP: No components found with this basename: POCBNP,  CBG:  Recent Labs Lab 10/13/13 1328  10/13/13 1700 10/13/13 1953 10/14/13 0742 10/14/13 1150  GLUCAP 120* 144* 162* 136* 207*     Micro Results: No results found for this or any previous visit (from the past 240 hour(s)).  Studies/Results: Ct Head Wo Contrast  09/20/2013   CLINICAL DATA:  Altered mental status ; status post cardiopulmonary resuscitation  EXAM: CT HEAD WITHOUT CONTRAST  TECHNIQUE: Contiguous axial images were obtained from the base of the skull through the vertex without intravenous contrast.  COMPARISON:  None.  FINDINGS: There is age related volume loss. There is no demonstrable mass, hemorrhage, extra-axial fluid collection, or midline shift. There is mild small vessel disease in the centra semiovale bilaterally. There is decreased attenuation in the midline of the upper to mid pons which may represent a small recent infarct in this area. No other acute appearing infarct is appreciable.  Bony calvarium appears intact.  The mastoid air cells are clear.  IMPRESSION: Age uncertain small infarct mid pons. Small infarct in this area may be recent.  There is underlying age related volume loss with mild periventricular small vessel disease. There  is no intracranial mass or hemorrhage. No gray-white compartment edema is appreciable on this study.   Electronically Signed   By: Bretta Bang M.D.   On: 09/20/2013 12:54   Dg Chest Port 1 View  09/25/2013   CLINICAL DATA:  Edema after dialysis.  EXAM: PORTABLE CHEST - 1 VIEW  COMPARISON:  One-view chest 09/24/2013.  FINDINGS: Cardiac enlargement is stable. A left IJ line is stable in position. Interstitial edema is improved. No focal airspace consolidation is evident.  IMPRESSION: 1. Improved interstitial edema. 2. Stable cardiomegaly.   Electronically Signed   By: Gennette Pac M.D.   On: 09/25/2013 21:55   Dg Chest Port 1 View  09/24/2013   CLINICAL DATA:  Respiratory failure  EXAM: PORTABLE CHEST - 1 VIEW  COMPARISON:  09/23/2013  FINDINGS: Interval tracheal and  esophageal extubation. Left IJ catheter in stable position, tip at the superior cavoatrial junction.  Unchanged cardiomegaly and pulmonary edema. These changes could obscure superimposed infection. No evidence of increased pleural fluid. No pneumothorax.  IMPRESSION: 1. Stable lung volumes after tracheal extubation. 2. Unchanged pulmonary edema, which could obscure superimposed consolidation.   Electronically Signed   By: Tiburcio Pea M.D.   On: 09/24/2013 06:23   Dg Chest Port 1 View  09/23/2013   CLINICAL DATA:  Respiratory failure.  EXAM: PORTABLE CHEST - 1 VIEW  COMPARISON:  09/22/2013.  FINDINGS: Endotracheal tube, left IJ line, NG tube in stable position. Persistent cardiomegaly and pulmonary venous congestion present. Interstitial prominence present. These findings consistent with congestive heart failure. Left base atelectasis and/or infiltrate present. Left pleural effusion has improved. No pneumothorax.  IMPRESSION: 1. Stable line and tube positions. 2. Findings consistent with congestive heart failure with pulmonary interstitial edema again noted. Left pleural effusion has improved. 3. Left lower lobe atelectasis and/or infiltrate.   Electronically Signed   By: Maisie Fus  Register   On: 09/23/2013 07:36   Dg Chest Port 1 View  09/22/2013   CLINICAL DATA:  Respiratory failure.  EXAM: PORTABLE CHEST - 1 VIEW  COMPARISON:  Chest radiograph September 21, 2013  FINDINGS: Endotracheal tube tip projects 4.2 cm above the carina. Left internal jugular central venous catheter with distal tip at the cavoatrial junction. Nasogastric tube past the gastroesophageal junction region code though, tip is not visualized. No pneumothorax.  Stable cardiomegaly, worsening interstitial edema, with minimal patchy airspace opacity in left lung base, unchanged. Suspected small left pleural effusion. Soft tissue planes and included osseous structures are unchanged.  IMPRESSION: No apparent change in life-support lines.  Stable  cardiomegaly with slightly worsening interstitial edema. Retrocardiac airspace opacity may reflect atelectasis or confluent edema with suspected small left pleural effusion.   Electronically Signed   By: Awilda Metro   On: 09/22/2013 06:31   Dg Chest Port 1 View  09/21/2013   CLINICAL DATA:  Respiratory distress.  Intubated patient.  EXAM: PORTABLE CHEST - 1 VIEW  COMPARISON:  09/20/2013  FINDINGS: Endotracheal tube tip lies 3 cm above the carina. Nasogastric tube passes below the diaphragm well into the stomach. Left internal jugular central venous line tip lies the caval atrial junction.  Mild hazy airspace opacity is noted in the medial right lung base. This may reflect atelectasis. Infiltrate is possible. Lungs are otherwise clear. No pleural effusion or pneumothorax  IMPRESSION: 1. Support apparatus is stable in well positioned. 2. Mild hazy opacity in the medial right lung base, likely atelectasis. This potentially could reflect a small area of infiltrate or even  asymmetric edema. Lungs are otherwise clear. Findings are similar to the prior exam allowing for differences in patient positioning and technique.   Electronically Signed   By: Amie Portland M.D.   On: 09/21/2013 12:27   Dg Chest Port 1 View  09/20/2013   CLINICAL DATA:  Post IJ placement  EXAM: PORTABLE CHEST - 1 VIEW  COMPARISON:  Prior chest x-ray 09/20/2013 at 9:19 a.m.  FINDINGS: Interval placement of a left IJ approach central venous catheter. The tip of the catheter is in good position at the superior cavoatrial junction. No evidence of complicating pneumothorax. The patient remains intubated. The tip of the endotracheal tube is 4 cm above the carina. A nasogastric tube is present, the tip lies off the field of view presumably within the stomach. External defibrillator pads and metallic artifacts overlie the chest. Stable cardiomegaly, very low inspiratory volumes and mild pulmonary edema.  IMPRESSION: 1. New left IJ approach central  venous catheter with the tip at the superior cavoatrial junction. No evidence of pneumothorax or other complication. 2. Otherwise, no significant interval change in the appearance of the chest.   Electronically Signed   By: Malachy Moan M.D.   On: 09/20/2013 11:48   Dg Chest Portable 1 View  09/20/2013   CLINICAL DATA:  Status post intubation.  EXAM: PORTABLE CHEST - 1 VIEW  COMPARISON:  None.  FINDINGS: 0919 hrs. Endotracheal tube tip is 3.8 cm above the base of the carina. The NG tube passes into the stomach although the distal tip position is not included on the film. Lung volumes are low. The cardio pericardial silhouette is enlarged. Diffuse interstitial opacity is associated with vascular congestion. Telemetry leads overlie the chest. No evidence for pneumothorax or substantial pleural effusion.  IMPRESSION: Low volume film with enlargement of the cardiopericardial silhouette. Vascular congestion with interstitial opacities suggest associated edema.   Electronically Signed   By: Kennith Center M.D.   On: 09/20/2013 09:30    Medications: Scheduled Meds: . acetaminophen  500 mg Oral TID  . alteplase  2 mg Intracatheter Once  . darbepoetin (ARANESP) injection - DIALYSIS  200 mcg Intravenous Q Wed-HD  . docusate sodium  100 mg Oral BID  . doxercalciferol  8 mcg Intravenous Q M,W,F-HD  . famotidine  20 mg Oral QHS  . feeding supplement (NEPRO CARB STEADY)  237 mL Oral QPC supper  . insulin aspart  0-9 Units Subcutaneous TID WC  . midodrine  5 mg Oral Q M,W,F-HD  . multivitamin  1 tablet Oral QHS  . pantoprazole (PROTONIX) IV  40 mg Intravenous Q12H  . sevelamer carbonate  1,600 mg Oral TID WC   Time Spent: 25 minutes   LOS: 24 days   Davonna Ertl M.D. [facility] 10/14/2013, 3:38 PM Pager: [phone].  If 7PM-7AM, please contact night-coverage [email or web address] Password [email or web address]

## 2013-10-14 NOTE — Progress Notes (Signed)
NUTRITION FOLLOW UP  Intervention:   Continue Nepro Shake PO once daily, each supplement provides 425 kcal and 19 grams protein  Nutritional Services Ambassador assisting patient with meal options.  Allow double protein portions.  Nutrition Dx:   No new nutrition diagnosis at this time.  Goal:   Intake to meet >90% of estimated nutrition needs. Met.  Monitor:   PO intake, labs, weight trend.  Assessment:   Pt admitted on 1/12 after cardiac arrest during HD session. Cerebral arteriogram procedure for today was cancelled due to too high P2Y12 levels. Patient with recurrent cardiorespiratory arrest occuring during dialysis likely related to poor cerebral perfusion with severe ASCVD.  Currently receiving Renal diet with good oral intake. Consuming 75-100% of meals. Double protein portions are being allowed. She has been drinking Nepro once daily in the evening. Daughter is bringing in foods for patient.  Height: Ht Readings from Last 1 Encounters:  10/01/13 _0  (1.676 m)    Weight Status:   Wt Readings from Last 1 Encounters:  10/14/13 180 lb 8.9 oz (81.9 kg)  09/24/13  184 lb 8.4 oz (83.7 kg)   Re-estimated needs:  Kcal: 1600-1800 Protein: 80-90 gm Fluid: 1.2 L  Skin: left groin incision  Diet Order: Renal 80/90-2-2 with 1200 ml fluid restriction; Nepro once daily.   Intake/Output Summary (Last 24 hours) at 10/14/13 0920 Last data filed at 10/13/13 1247  Gross per 24 hour  Intake      0 ml  Output   2891 ml  Net  -2891 ml    Last BM: 2/3   Labs:   Recent Labs Lab 10/09/13 1411 10/11/13 1243 10/13/13 0835  NA 135* 136* 139  K 5.2 5.3 4.1  CL 91* 94* 97  CO2 _1 BUN 101* 45* 25*  CREATININE 7.79* 6.24* 5.61*  CALCIUM 9.6 9.7 9.8  PHOS 3.7 3.8 4.1  GLUCOSE 209* 160* 180*    CBG (last 3)   Recent Labs  10/13/13 1700 10/13/13 1953 10/14/13 0742  GLUCAP 144* 162* 136*    Scheduled Meds: . acetaminophen  500 mg Oral TID  . alteplase   2 mg Intracatheter Once  . darbepoetin (ARANESP) injection - DIALYSIS  200 mcg Intravenous Q Wed-HD  . docusate sodium  100 mg Oral BID  . doxercalciferol  8 mcg Intravenous Q M,W,F-HD  . famotidine  20 mg Oral QHS  . feeding supplement (NEPRO CARB STEADY)  237 mL Oral QPC supper  . insulin aspart  0-9 Units Subcutaneous TID WC  . midodrine  5 mg Oral Q M,W,F-HD  . multivitamin  1 tablet Oral QHS  . pantoprazole (PROTONIX) IV  40 mg Intravenous Q12H  . sevelamer carbonate  1,600 mg Oral TID WC    Continuous Infusions: . sodium chloride Stopped (10/04/13 1600)     Molli Barrows, RD, LDN, [place] Pager [phone] After Hours Pager [phone]

## 2013-10-14 NOTE — Progress Notes (Signed)
[name]   Subjective:   Interval History: no complaints although many questions. Refused further cardiac work up. She does appear quite weak but tolerating dialysis   Objective:  Vital signs in last 24 hours:  Temp:  [97.8 F (36.6 C)-98.9 F (37.2 C)] 98.6 F (37 C) (02/05 0525) Pulse Rate:  [70-79] 72 (02/05 0525) Resp:  [16-24] 18 (02/05 0525) BP: (122-152)/(50-76) 129/76 mmHg (02/05 0525) SpO2:  [99 %-100 %] 100 % (02/05 0525) Weight:  [81.9 kg (180 lb 8.9 oz)-82.4 kg (181 lb 10.5 oz)] 81.9 kg (180 lb 8.9 oz) (02/05 0525)  Weight change: 2.745 kg (6 lb 0.8 oz) Filed Weights   10/13/13 0831 10/13/13 1247 10/14/13 0525  Weight: 85.3 kg (188 lb 0.8 oz) 82.4 kg (181 lb 10.5 oz) 81.9 kg (180 lb 8.9 oz)    Intake/Output: I/O last 3 completed shifts: In: -  Out: 2891 [Other:2891]   Intake/Output this shift:    CVS- RRR Systolic ejection murmur  RS- CTA No wheezes or rales  ABD- BS present soft non-distended  EXT- no edema    Basic Metabolic Panel:  Recent Labs Lab 10/08/13 0442 10/09/13 1411 10/11/13 1243 10/13/13 0835  NA  --  135* 136* 139  K  --  5.2 5.3 4.1  CL  --  91* 94* 97  CO2  --  22 25 25   GLUCOSE  --  209* 160* 180*  BUN  --  101* 45* 25*  CREATININE 5.99* 7.79* 6.24* 5.61*  CALCIUM  --  9.6 9.7 9.8  PHOS  --  3.7 3.8 4.1    Liver Function Tests:  Recent Labs Lab 10/09/13 1411 10/11/13 1243 10/13/13 0835  ALBUMIN 2.7* 3.0* 3.1*   No results found for this basename: LIPASE, AMYLASE,  in the last 168 hours No results found for this basename: AMMONIA,  in the last 168 hours  CBC:  Recent Labs Lab 10/08/13 2209 10/09/13 0420 10/10/13 0530 10/11/13 1243 10/13/13 0834  WBC 14.2* 15.0* 10.7* 11.4* 10.0  HGB 6.6* 7.8* 8.6* 8.9* 9.2*  HCT 20.0* 23.4* 25.5* 27.5* 28.4*  MCV 95.2 95.9 93.4 95.8 97.3  PLT 258 246 201 230 213    Cardiac Enzymes: No results found for this basename: CKTOTAL, CKMB,  CKMBINDEX, TROPONINI,  in the last 168 hours  BNP: No components found with this basename: POCBNP,   CBG:  Recent Labs Lab 10/13/13 0747 10/13/13 1328 10/13/13 1700 10/13/13 1953 10/14/13 0742  GLUCAP 159* 120* 144* 162* 136*    Microbiology: Results for orders placed during the hospital encounter of 09/20/13  MRSA PCR SCREENING     Status: None   Collection Time    09/20/13  1:06 PM      Result Value Range Status   MRSA by PCR NEGATIVE  NEGATIVE Final   Comment:            The GeneXpert MRSA Assay (FDA     approved for NASAL specimens     only), is one component of a     comprehensive MRSA colonization     surveillance program. It is not     intended to diagnose MRSA     infection nor to guide or     monitor treatment for     MRSA infections.    Coagulation Studies: No results found for this basename: LABPROT, INR,  in the last 72 hours  Urinalysis: No results found for this basename: COLORURINE, APPERANCEUR, LABSPEC, PHURINE,  GLUCOSEU, HGBUR, BILIRUBINUR, KETONESUR, PROTEINUR, UROBILINOGEN, NITRITE, LEUKOCYTESUR,  in the last 72 hours    Imaging: No results found.   Medications:   . sodium chloride Stopped (10/04/13 1600)   . acetaminophen  500 mg Oral TID  . alteplase  2 mg Intracatheter Once  . darbepoetin (ARANESP) injection - DIALYSIS  200 mcg Intravenous Q Wed-HD  . docusate sodium  100 mg Oral BID  . doxercalciferol  8 mcg Intravenous Q M,W,F-HD  . famotidine  20 mg Oral QHS  . feeding supplement (NEPRO CARB STEADY)  237 mL Oral QPC supper  . insulin aspart  0-9 Units Subcutaneous TID WC  . midodrine  5 mg Oral Q M,W,F-HD  . multivitamin  1 tablet Oral QHS  . pantoprazole (PROTONIX) IV  40 mg Intravenous Q12H  . 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 Readmitted for PEA arrest while on dialysis. Patient cardiac arrest etiology is not fully determined, question carotid stenosis, severe pulm HTN or severe CAD may be contributing when she is becoming hypotensivie during dialysis sessions, cardiac arrytmia. Plan is for intracranial angiogram and or possible stent 1-29 to see if that help with circulation. Not safe to discharge patient to outpatient dialysis   ESRD Dialysis MWF Unfortunately several cardiac arrests with Hemodialysis now exploring ideas such as Next stage . Complicated patient that is a very high surgical risk  HTN controlled Midodrine support  Anemia darbepoietin 100mcg transfused  Bones Hectorol 8mcg and renvela  Carotid disease anticoagulation held for GI bleed. Family and Dr Bedelia Personevenshwar leaning toward medical management  GI bleed PPI x next few days and then retrial anticoagulation  Discussed with daughter Briana Gould In room, she would like more information about home therapy and will meet with the home therapy team this a.m. Discussed with Dr Lowell G[facility]Powell and [facility] yesterday, Patient weak and quite high risk of further arrest. Recommended in patient treatments   LOS: 24 Briana Gould W @TODAY @10 :38 AM

## 2013-10-14 NOTE — Progress Notes (Signed)
Physical Therapy Treatment Patient Details Name: Briana Gould MRN: [id number] DOB: July 01, 1946 Today's Date: 10/14/2013 Time: [id number] PT Time Calculation (min): 31 min  PT Assessment / Plan / Recommendation  History of Present Illness 68 year old female w/ ESRD, [facility] after being treated for UGIB while on coumadin for Right IJ thrombus stroke prevention. Was in usual state of health, getting stronger after her d/c. On 1/12 was at regular scheduled HD. About 20 minutes into HD she slumped over and became unresponsive. Initial rhythm was reported as PEA. CPR started. Reported 20 minutes CPR before ROSC, no drugs. Transferred to [facility] s/p arrest. Of note, she has PMHx of left BKA and wears a prosthesis.     PT Comments   Pt is progressing well with increased gait distance today (although required a standing rest break to be able to increase gait distance due to fatigue).  PT focused this session on working on increasing pt's independence with sit to stand transfers from chair.  Chair elevated and chair push ups preformed.  Pt needs continued practice to get stronger with this transition. I also educated her about sitting surface choices (i.e. Higher chairs are better, chairs with armrests help, deep/low couches and chairs are not good).  Pt continues to want CIR level therapies at discharge and I believe this is appropriate if she is approved.    Follow Up Recommendations  CIR     Does the patient have the potential to tolerate intense rehabilitation    NA  Barriers to Discharge   None      Equipment Recommendations  None recommended by PT    Recommendations for Other Services   None  Frequency Min 3X/week   Progress towards PT Goals Progress towards PT goals: Progressing toward goals  Plan Current plan remains appropriate    Precautions / Restrictions Precautions Precautions: Fall   Pertinent Vitals/Pain See vitals flow sheet.     Mobility   Transfers Equipment used: Rolling walker (2 wheeled) Transfers: Sit to/from Stand Sit to Stand: Mod assist General transfer comment: mod assist to support trunk to get to standing from lower recliner chair.  Even when seat boosted with blankets pt struggled to get up from sitting.  Verbal cues for hand placement and forward momentum of trunk over legs.   Ambulation/Gait Ambulation/Gait assistance: Min guard Ambulation Distance (Feet): 85 Feet Assistive device: Rolling walker (2 wheeled) Gait Pattern/deviations: Step-through pattern;Trunk flexed Gait velocity: decreased Gait velocity interpretation: <1.8 ft/sec, indicative of risk for recurrent falls General Gait Details: one standing rest break needed due to fatigue 3/4th of the way through the walk.      Exercises General Exercises - Lower Extremity Long Arc Quad: AROM;Both;10 reps;Seated Hip ABduction/ADduction: AROM;Both;10 reps;Seated Hip Flexion/Marching: AROM;Both;10 reps;Seated Toe Raises: AROM;Right;10 reps;Seated Heel Raises: AROM;Right;10 reps;Seated Other Exercises Other Exercises: band exercises for hip abduction and knee extension (OT gave pt band for UE exercises- green) Other Exercises: HEP written exercises given with instructions on reps and frequency.   Other Exercises: Chair pushu ups x 10     PT Goals (current goals can now be found in the care plan section) Acute Rehab PT Goals Patient Stated Goal: to try to get strong enough to go home  Visit Information  Last PT Received On: 10/14/13 Assistance Needed: +1 History of Present Illness: 68 year old female w/ ESRD, [facility] after being treated for UGIB while on coumadin for Right IJ thrombus stroke  prevention. Was in usual state of health, getting stronger after her d/c. On 1/12 was at regular scheduled HD. About 20 minutes into HD she slumped over and became unresponsive. Initial rhythm was reported as PEA. CPR started. Reported 20 minutes  CPR before ROSC, no drugs. Transferred to [facility] s/p arrest. Of note, she has PMHx of left BKA and wears a prosthesis.      Subjective Data  Subjective: Pt reports she is still very interested in CIR level therapies.   Patient Stated Goal: to try to get strong enough to go home   Cognition  Cognition Arousal/Alertness: Awake/alert Behavior During Therapy: WFL for tasks assessed/performed Overall Cognitive Status: Within Functional Limits for tasks assessed    Balance  Balance Standing balance support: Bilateral upper extremity supported Standing balance-Leahy Scale: Fair  End of Session PT - End of Session Equipment Utilized During Treatment: Gait belt;Other (comment) (left leg prosthesis) Activity Tolerance: Patient limited by fatigue Patient left: in chair;with call bell/phone within reach;with family/visitor present     Lurena Joiner B. Avonda Toso, PT, DPT [phone]   10/14/2013, 3:12 PM

## 2013-10-15 ENCOUNTER — Inpatient Hospital Stay (HOSPITAL_COMMUNITY)
Admission: RE | Admit: 2013-10-15 | Discharge: 2013-10-25 | DRG: 945 | Disposition: A | Discharge status: Home Health | Payer: Medicare Other | Source: Intra-hospital | Attending: Physical Medicine & Rehabilitation | Admitting: Physical Medicine & Rehabilitation

## 2013-10-15 ENCOUNTER — Encounter (HOSPITAL_COMMUNITY): Payer: Self-pay | Admitting: Physical Medicine and Rehabilitation

## 2013-10-15 ENCOUNTER — Telehealth: Payer: Self-pay | Admitting: Physical Medicine and Rehabilitation

## 2013-10-15 DIAGNOSIS — N189 Chronic kidney disease, unspecified: Secondary | ICD-10-CM

## 2013-10-15 DIAGNOSIS — I498 Other specified cardiac arrhythmias: Secondary | ICD-10-CM | POA: Diagnosis not present

## 2013-10-15 DIAGNOSIS — Z8674 Personal history of sudden cardiac arrest: Secondary | ICD-10-CM

## 2013-10-15 DIAGNOSIS — I6529 Occlusion and stenosis of unspecified carotid artery: Secondary | ICD-10-CM | POA: Diagnosis present

## 2013-10-15 DIAGNOSIS — Z8673 Personal history of transient ischemic attack (TIA), and cerebral infarction without residual deficits: Secondary | ICD-10-CM

## 2013-10-15 DIAGNOSIS — D631 Anemia in chronic kidney disease: Secondary | ICD-10-CM | POA: Diagnosis present

## 2013-10-15 DIAGNOSIS — E119 Type 2 diabetes mellitus without complications: Secondary | ICD-10-CM

## 2013-10-15 DIAGNOSIS — R5381 Other malaise: Secondary | ICD-10-CM

## 2013-10-15 DIAGNOSIS — E1129 Type 2 diabetes mellitus with other diabetic kidney complication: Secondary | ICD-10-CM

## 2013-10-15 DIAGNOSIS — R071 Chest pain on breathing: Secondary | ICD-10-CM | POA: Diagnosis present

## 2013-10-15 DIAGNOSIS — I10 Essential (primary) hypertension: Secondary | ICD-10-CM

## 2013-10-15 DIAGNOSIS — K922 Gastrointestinal hemorrhage, unspecified: Secondary | ICD-10-CM | POA: Diagnosis present

## 2013-10-15 DIAGNOSIS — N186 End stage renal disease: Secondary | ICD-10-CM | POA: Diagnosis present

## 2013-10-15 DIAGNOSIS — S88119A Complete traumatic amputation at level between knee and ankle, unspecified lower leg, initial encounter: Secondary | ICD-10-CM

## 2013-10-15 DIAGNOSIS — I2510 Atherosclerotic heart disease of native coronary artery without angina pectoris: Secondary | ICD-10-CM

## 2013-10-15 DIAGNOSIS — Z5189 Encounter for other specified aftercare: Principal | ICD-10-CM

## 2013-10-15 DIAGNOSIS — Z86718 Personal history of other venous thrombosis and embolism: Secondary | ICD-10-CM

## 2013-10-15 DIAGNOSIS — K920 Hematemesis: Secondary | ICD-10-CM | POA: Diagnosis present

## 2013-10-15 DIAGNOSIS — I120 Hypertensive chronic kidney disease with stage 5 chronic kidney disease or end stage renal disease: Secondary | ICD-10-CM | POA: Diagnosis present

## 2013-10-15 DIAGNOSIS — I82C19 Acute embolism and thrombosis of unspecified internal jugular vein: Secondary | ICD-10-CM | POA: Diagnosis present

## 2013-10-15 DIAGNOSIS — I679 Cerebrovascular disease, unspecified: Secondary | ICD-10-CM

## 2013-10-15 DIAGNOSIS — I469 Cardiac arrest, cause unspecified: Secondary | ICD-10-CM | POA: Diagnosis not present

## 2013-10-15 DIAGNOSIS — D649 Anemia, unspecified: Secondary | ICD-10-CM | POA: Diagnosis present

## 2013-10-15 DIAGNOSIS — E1165 Type 2 diabetes mellitus with hyperglycemia: Secondary | ICD-10-CM

## 2013-10-15 DIAGNOSIS — Z992 Dependence on renal dialysis: Secondary | ICD-10-CM

## 2013-10-15 LAB — CREATININE, SERUM
Creatinine, Ser: 6.03 mg/dL — ABNORMAL HIGH (ref 0.50–1.10)
GFR calc Af Amer: 8 mL/min — ABNORMAL LOW (ref >90)
GFR, EST NON AFRICAN AMERICAN: 6 mL/min — AB (ref >90)

## 2013-10-15 LAB — GLUCOSE, CAPILLARY
GLUCOSE-CAPILLARY: 162 mg/dL — AB (ref 70–99)
Glucose-Capillary: 146 mg/dL — ABNORMAL HIGH (ref 70–99)

## 2013-10-15 LAB — RENAL FUNCTION PANEL
ALBUMIN: 2.7 g/dL — AB (ref 3.5–5.2)
BUN: 24 mg/dL — ABNORMAL HIGH (ref 6–23)
CHLORIDE: 97 meq/L (ref 96–112)
CO2: 27 meq/L (ref 19–32)
Calcium: 9.9 mg/dL (ref 8.4–10.5)
Creatinine, Ser: 5.95 mg/dL — ABNORMAL HIGH (ref 0.50–1.10)
GFR, EST AFRICAN AMERICAN: 8 mL/min — AB (ref >90)
GFR, EST NON AFRICAN AMERICAN: 7 mL/min — AB (ref >90)
Glucose, Bld: 198 mg/dL — ABNORMAL HIGH (ref 70–99)
Phosphorus: 3.9 mg/dL (ref 2.3–4.6)
Potassium: 4 mEq/L (ref 3.7–5.3)
SODIUM: 139 meq/L (ref 137–147)

## 2013-10-15 LAB — CBC
HCT: 28.9 % — ABNORMAL LOW (ref 36.0–46.0)
Hemoglobin: 9.1 g/dL — ABNORMAL LOW (ref 12.0–15.0)
MCH: 31.1 pg (ref 26.0–34.0)
MCHC: 31.5 g/dL (ref 30.0–36.0)
MCV: 98.6 fL (ref 78.0–100.0)
PLATELETS: 200 10*3/uL (ref 150–400)
RBC: 2.93 MIL/uL — AB (ref 3.87–5.11)
RDW: 18.4 % — ABNORMAL HIGH (ref 11.5–15.5)
WBC: 10.8 10*3/uL — AB (ref 4.0–10.5)

## 2013-10-15 MED ORDER — LIDOCAINE-PRILOCAINE 2.5-2.5 % EX CREA
1.0000 | TOPICAL_CREAM | CUTANEOUS | Status: DC | PRN
Start: 2013-10-15 — End: 2013-10-15

## 2013-10-15 MED ORDER — DOXERCALCIFEROL 4 MCG/2ML IV SOLN
INTRAVENOUS | Status: AC
  Administered 2013-10-15: 8 ug via INTRAVENOUS
  Filled 2013-10-15: qty 4

## 2013-10-15 MED ORDER — INSULIN ASPART 100 UNIT/ML ~~LOC~~ SOLN
0.0000 [IU] | Freq: Three times a day (TID) | SUBCUTANEOUS | Status: DC
  Administered 2013-10-15: 2 [IU] via SUBCUTANEOUS
  Administered 2013-10-16: 1 [IU] via SUBCUTANEOUS
  Administered 2013-10-16 (×2): 2 [IU] via SUBCUTANEOUS
  Administered 2013-10-17: 1 [IU] via SUBCUTANEOUS
  Administered 2013-10-17: 3 [IU] via SUBCUTANEOUS
  Administered 2013-10-17 – 2013-10-18 (×2): 1 [IU] via SUBCUTANEOUS
  Administered 2013-10-18 – 2013-10-19 (×2): 2 [IU] via SUBCUTANEOUS
  Administered 2013-10-19: 1 [IU] via SUBCUTANEOUS
  Administered 2013-10-20: 2 [IU] via SUBCUTANEOUS
  Administered 2013-10-20 – 2013-10-21 (×3): 1 [IU] via SUBCUTANEOUS
  Administered 2013-10-22 – 2013-10-24 (×3): 2 [IU] via SUBCUTANEOUS

## 2013-10-15 MED ORDER — ALUMINUM HYDROXIDE GEL 320 MG/5ML PO SUSP
15.0000 mL | Freq: Four times a day (QID) | ORAL | Status: DC | PRN
  Filled 2013-10-15: qty 30

## 2013-10-15 MED ORDER — MIDODRINE HCL 5 MG PO TABS
5.0000 mg | ORAL_TABLET | ORAL | Status: DC
  Administered 2013-10-18 – 2013-10-25 (×4): 5 mg via ORAL
  Filled 2013-10-15 (×4): qty 1

## 2013-10-15 MED ORDER — SODIUM CHLORIDE 0.9 % IV SOLN
100.0000 mL | INTRAVENOUS | Status: DC | PRN
Start: 2013-10-15 — End: 2013-10-15

## 2013-10-15 MED ORDER — TRAZODONE HCL 50 MG PO TABS: 25.0000 mg | ORAL_TABLET | Freq: Every evening | ORAL | Status: DC | PRN

## 2013-10-15 MED ORDER — PROCHLORPERAZINE EDISYLATE 5 MG/ML IJ SOLN
5.0000 mg | Freq: Four times a day (QID) | INTRAMUSCULAR | Status: DC | PRN
  Filled 2013-10-15: qty 2

## 2013-10-15 MED ORDER — SORBITOL 70 % SOLN
30.0000 mL | Freq: Two times a day (BID) | Status: DC | PRN
  Administered 2013-10-17 – 2013-10-21 (×3): 30 mL via ORAL
  Filled 2013-10-15 (×3): qty 30

## 2013-10-15 MED ORDER — SODIUM CHLORIDE 0.9 % IV SOLN: 100.0000 mL | INTRAVENOUS | Status: DC | PRN

## 2013-10-15 MED ORDER — LIDOCAINE HCL (PF) 1 % IJ SOLN: 5.0000 mL | INTRAMUSCULAR | Status: DC | PRN

## 2013-10-15 MED ORDER — NEPRO/CARBSTEADY PO LIQD
237.0000 mL | Freq: Every day | ORAL | Status: DC
  Administered 2013-10-15 – 2013-10-24 (×6): 237 mL via ORAL

## 2013-10-15 MED ORDER — GUAIFENESIN-DM 100-10 MG/5ML PO SYRP: 5.0000 mL | ORAL_SOLUTION | Freq: Four times a day (QID) | ORAL | Status: DC | PRN

## 2013-10-15 MED ORDER — SODIUM CHLORIDE 0.9 % IJ SOLN: 3.0000 mL | INTRAMUSCULAR | Status: DC | PRN

## 2013-10-15 MED ORDER — DIPHENHYDRAMINE HCL 12.5 MG/5ML PO ELIX: 12.5000 mg | ORAL_SOLUTION | Freq: Four times a day (QID) | ORAL | Status: DC | PRN

## 2013-10-15 MED ORDER — HYDROMORPHONE HCL PF 1 MG/ML IJ SOLN: 0.2500 mg | INTRAMUSCULAR | Status: DC | PRN

## 2013-10-15 MED ORDER — PENTAFLUOROPROP-TETRAFLUOROETH EX AERO: 1.0000 "application " | INHALATION_SPRAY | CUTANEOUS | Status: DC | PRN

## 2013-10-15 MED ORDER — NEPRO/CARBSTEADY PO LIQD: 237.0000 mL | ORAL | Status: DC | PRN

## 2013-10-15 MED ORDER — SODIUM CHLORIDE 0.9 % IV SOLN
INTRAVENOUS | Status: DC
Start: 2013-10-15 — End: 2013-10-25

## 2013-10-15 MED ORDER — ONDANSETRON HCL 4 MG/2ML IJ SOLN: 4.0000 mg | Freq: Once | INTRAMUSCULAR | Status: AC | PRN

## 2013-10-15 MED ORDER — SODIUM CHLORIDE 0.9 % IV SOLN: 250.0000 mL | INTRAVENOUS | Status: DC | PRN

## 2013-10-15 MED ORDER — PROCHLORPERAZINE 25 MG RE SUPP
12.5000 mg | Freq: Four times a day (QID) | RECTAL | Status: DC | PRN
  Filled 2013-10-15: qty 1

## 2013-10-15 MED ORDER — FLEET ENEMA 7-19 GM/118ML RE ENEM: 1.0000 | ENEMA | Freq: Once | RECTAL | Status: AC | PRN

## 2013-10-15 MED ORDER — SEVELAMER CARBONATE 800 MG PO TABS
1600.0000 mg | ORAL_TABLET | Freq: Three times a day (TID) | ORAL | Status: DC
  Administered 2013-10-15 – 2013-10-25 (×25): 1600 mg via ORAL
  Filled 2013-10-15 (×33): qty 2

## 2013-10-15 MED ORDER — RENA-VITE PO TABS
1.0000 | ORAL_TABLET | Freq: Every day | ORAL | Status: DC
  Administered 2013-10-15 – 2013-10-24 (×10): 1 via ORAL
  Filled 2013-10-15 (×11): qty 1

## 2013-10-15 MED ORDER — DOXERCALCIFEROL 4 MCG/2ML IV SOLN
8.0000 ug | INTRAVENOUS | Status: DC
  Administered 2013-10-18 – 2013-10-20 (×2): 8 ug via INTRAVENOUS
  Filled 2013-10-15 (×2): qty 4

## 2013-10-15 MED ORDER — PROCHLORPERAZINE MALEATE 5 MG PO TABS
5.0000 mg | ORAL_TABLET | Freq: Four times a day (QID) | ORAL | Status: DC | PRN
  Filled 2013-10-15 (×2): qty 2

## 2013-10-15 MED ORDER — PANTOPRAZOLE SODIUM 40 MG PO TBEC: 40.0000 mg | DELAYED_RELEASE_TABLET | Freq: Two times a day (BID) | ORAL | Status: DC

## 2013-10-15 MED ORDER — FAMOTIDINE 20 MG PO TABS
20.0000 mg | ORAL_TABLET | Freq: Every day | ORAL | Status: DC
  Administered 2013-10-15 – 2013-10-24 (×10): 20 mg via ORAL
  Filled 2013-10-15 (×11): qty 1

## 2013-10-15 MED ORDER — TRAMADOL HCL 50 MG PO TABS: 50.0000 mg | ORAL_TABLET | Freq: Three times a day (TID) | ORAL | Status: DC | PRN

## 2013-10-15 MED ORDER — ACETAMINOPHEN 325 MG PO TABS
650.0000 mg | ORAL_TABLET | Freq: Four times a day (QID) | ORAL | Status: DC | PRN
  Administered 2013-10-20: 650 mg via ORAL
  Filled 2013-10-15: qty 2

## 2013-10-15 MED ORDER — SODIUM CHLORIDE 0.9 % IJ SOLN
3.0000 mL | Freq: Two times a day (BID) | INTRAMUSCULAR | Status: DC
  Administered 2013-10-16 – 2013-10-17 (×2): 3 mL via INTRAVENOUS

## 2013-10-15 MED ORDER — ALTEPLASE 2 MG IJ SOLR
2.0000 mg | Freq: Once | INTRAMUSCULAR | Status: DC | PRN
  Filled 2013-10-15: qty 2

## 2013-10-15 MED ORDER — RESOURCE THICKENUP CLEAR PO POWD
ORAL | Status: DC | PRN
  Filled 2013-10-15: qty 125

## 2013-10-15 MED ORDER — ASPIRIN 81 MG PO CHEW
81.0000 mg | CHEWABLE_TABLET | ORAL | Status: AC
Start: 2013-10-16 — End: 2013-10-16
  Administered 2013-10-16: 81 mg via ORAL
  Filled 2013-10-15: qty 1

## 2013-10-15 MED ORDER — HEPARIN SODIUM (PORCINE) 1000 UNIT/ML DIALYSIS: 1000.0000 [IU] | INTRAMUSCULAR | Status: DC | PRN

## 2013-10-15 MED ORDER — DOCUSATE SODIUM 100 MG PO CAPS
100.0000 mg | ORAL_CAPSULE | Freq: Two times a day (BID) | ORAL | Status: DC
  Administered 2013-10-15 – 2013-10-24 (×17): 100 mg via ORAL
  Filled 2013-10-15 (×22): qty 1

## 2013-10-15 MED ORDER — DARBEPOETIN ALFA-POLYSORBATE 200 MCG/0.4ML IJ SOLN
200.0000 ug | INTRAMUSCULAR | Status: DC
  Administered 2013-10-20: 200 ug via INTRAVENOUS
  Filled 2013-10-15: qty 0.4

## 2013-10-15 NOTE — Progress Notes (Signed)
Received pt. From [facility].Pt. Is from home with family.Pt. Has a prosthesis from home for her LLE.Pt. Does not have any pressure sores,skin is dry and intact.RN educated pt. About safety,safety video was play,and general information about inpatient rehab was provided to pt.Keep monitoring pt. Closely and assessing her needs.

## 2013-10-15 NOTE — H&P (Signed)
Physical Medicine and Rehabilitation Admission H&P  Chief Complaint   Patient presents with   .  Deconditioning.   HPI: Briana Gould is a 68 y.o. 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 for upper GI bleeding in setting of Coumadin use(IJ thrombus) readmitted 09/20/2013 for PEA arrest. Patient did receive CPR x20 minutes. Cranial CT scan showed age uncertain a small infarct mid pons. Small infarct in this area felt to be possibly recent. Echocardiogram ejection fraction of 55% and no ST segment elevation on EKG. MRA showed significant intracranial atherosclerotic type changes. Carotid Dopplers 60-79% right ICA stenosis. Patient not felt to be a surgical intervention candidate. 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  Patient was extubated 09/23/2013. 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 60% stenosis of right ICA approximately and 50% stenosis left ICA petrous segment. She was started on antiplatelets in anticipation of stent placement but she developed A Fib with RVR on 10/07/13 but did convert to NSR. She developed hematemesis 10/08/13 due to recurrent GIB and required 2 units PRBC. Cardiac cath recommended by Dr. Johnsie Cancel to rule out development of cardiac disease (last cath 4 years ago/ recurrent cardiac arrests) but patient and family declined. Plans for L-ICA stent/angio in the future when stable. Hypotension controlled with midodrine support. Patient deconditioned and has difficulty with transitional movements. Rehab team recommending CIR and patient  admitted today.    Review of Systems  HENT: Negative for hearing loss.  Eyes: Positive for blurred vision.  Respiratory: Negative for cough and shortness of breath.  Cardiovascular: Positive for chest pain. Negative for palpitations.  Gastrointestinal: Negative for heartburn, nausea, vomiting and constipation.  Musculoskeletal: Positive for back pain and neck pain.  Neurological: Negative for dizziness, tingling, focal weakness, seizures and headaches.  Psychiatric/Behavioral: The patient does not have insomnia.    Past Medical History   Diagnosis  Date   .  End stage renal disease on dialysis    .  Peripheral arterial occlusive disease    .  Hypertension    .  Cardiac arrest    .  Diabetes mellitus type 2, uncontrolled, with complications    .  DVT (deep venous thrombosis)      Right internal jugular vein   .  CAD (coronary artery disease), native coronary artery  2011     Diffuse distal vessel CAD noted at cardiac catheterization    Past Surgical History   Procedure  Laterality  Date   .  Below knee leg amputation  Left    .  Av fistula placement  Left     Family History   Problem  Relation  Age of Onset   .  Heart attack  Brother    .  Stroke  Mother    .  Pulmonary embolism  Father     Social History: Married. Lives with family. Uses cane for mobility. Still drives. 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 She reports that she has never smoked. She does not have any smokeless tobacco history on file. She reports that she does not drink alcohol or use illicit drugs.   Allergies: No Known Allergies  Medications  Prior to Admission   Medication  Sig  Dispense  Refill   .  b complex-vitamin c-folic acid (NEPHRO-VITE) 0.8 MG TABS tablet  Take 1 tablet by mouth daily.     .  Nutritional Supplements (FEEDING SUPPLEMENT, NEPRO CARB STEADY,) LIQD  Take 237 mLs by mouth daily.     .  sevelamer carbonate (RENVELA) 800 MG tablet  Take 1,600 mg by  mouth 3 (three) times daily with meals.     .  traMADol (ULTRAM) 50 MG tablet  Take 50 mg by mouth every 12 (twelve) hours as needed (for pain).     .  [DISCONTINUED] amLODipine (NORVASC) 5 MG tablet  Take 5 mg by mouth daily.     .  [DISCONTINUED] doxazosin (CARDURA) 8 MG tablet  Take 8 mg by mouth daily.     .  [DISCONTINUED] famotidine (PEPCID) 40 MG tablet  Take 40 mg by mouth daily.     .  [DISCONTINUED] hydrALAZINE (APRESOLINE) 50 MG tablet  Take 50 mg by mouth 2 (two) times daily.     .  [DISCONTINUED] lisinopril (PRINIVIL,ZESTRIL) 40 MG tablet  Take 40 mg by mouth daily.      Home:  Home Living  Family/patient expects to be discharged to:: Private residence  Living Arrangements: Spouse/significant other;Other relatives;Other (Comment)  Available Help at Discharge: Family  Type of Home: House  Home Access: Ramped entrance  Home Layout: One level  Home Equipment: [place] - 2 wheels;Wheelchair - Brewing technologist - built in  Additional Comments: husband is disabled and cannot provide physical (A)  Lives With: Spouse;Family;Other (Comment)  Functional History:  Prior Function  Comments: PTA pt and sister reports that pt was independent with mobility and ADLs with Lt prosthesis  Functional Status:  Mobility:    Ambulation/Gait  Ambulation Distance (Feet): 85 Feet  Gait velocity: decreased  General Gait Details: one standing rest break needed due to fatigue 3/4th of the way through the walk.   ADL:  ADL  Eating/Feeding: Independent  Where Assessed - Eating/Feeding: Edge of bed  Grooming: Wash/dry face;Teeth care;Denture care;Minimal assistance;Wash/dry hands;Brushing hair (Min A for balance while standing; brushed hair sitting-setup)  Where Assessed - Grooming: Supported standing;Supported sitting  Upper Body Bathing: Set up;Supervision/safety  Where Assessed - Upper Body Bathing: Unsupported sitting  Lower Body Bathing: Minimal assistance  Where Assessed - Lower Body  Bathing: Supported sit to stand  Upper Body Dressing: Set up  Where Assessed - Upper Body Dressing: Unsupported sitting  Lower Body Dressing: Minimal assistance  Where Assessed - Lower Body Dressing: Supported sit to Retail buyer: Moderate assistance (from lower commode)  Toilet Transfer Method: Sit to stand;Stand pivot  Toilet Transfer Equipment: Regular height toilet;Grab bars  Tub/Shower Transfer Method: Not assessed  Equipment Used: Gait belt;Rolling walker;Other (comment) (prosthetic)  Transfers/Ambulation Related to ADLs: min A. Ambulating with RW  ADL Comments: Limited by poor endurance  Cognition:  Cognition  Overall Cognitive Status: Within Functional Limits for tasks assessed  Orientation Level: Oriented X4  Cognition  Arousal/Alertness: Awake/alert  Behavior During Therapy: WFL for tasks assessed/performed  Overall Cognitive Status: Within Functional Limits for tasks assessed  Area of Impairment: Orientation;Memory;Following commands;Problem solving;Awareness;Attention;Safety/judgement  Orientation Level: Disoriented to;Situation;Time  Current Attention Level: Alternating  Memory: Decreased short-term memory  Following Commands: Follows one step commands with increased time  Safety/Judgement: Decreased awareness of deficits;Decreased awareness of safety  Awareness: Anticipatory  Problem Solving: Slow processing;Difficulty sequencing;Requires verbal cues;Requires tactile cues  General Comments: pt's  cognitive status has improved significantly   Physical Exam:  Blood pressure 150/65, pulse 76, temperature 97.9 F (36.6 C), temperature source Oral, resp. rate 13, height 5' 6"  (1.676 m), weight 81.1 kg (178 lb 12.7 oz), SpO2 100.00%.  Physical Exam  Results for orders placed during the hospital encounter of 09/20/13 (from the past 48 hour(s))   GLUCOSE, CAPILLARY Status: Abnormal    Collection Time    10/13/13 5:00 PM   Result  Value  Range    Glucose-Capillary   144 (*)  70 - 99 mg/dL    Comment 1  Notify RN     Comment 2  Documented in Chart    GLUCOSE, CAPILLARY Status: Abnormal    Collection Time    10/13/13 7:53 PM   Result  Value  Range    Glucose-Capillary  162 (*)  70 - 99 mg/dL    Comment 1  Notify RN    GLUCOSE, CAPILLARY Status: Abnormal    Collection Time    10/14/13 7:42 AM   Result  Value  Range    Glucose-Capillary  136 (*)  70 - 99 mg/dL   GLUCOSE, CAPILLARY Status: Abnormal    Collection Time    10/14/13 11:50 AM   Result  Value  Range    Glucose-Capillary  207 (*)  70 - 99 mg/dL   GLUCOSE, CAPILLARY Status: Abnormal    Collection Time    10/14/13 4:24 PM   Result  Value  Range    Glucose-Capillary  141 (*)  70 - 99 mg/dL   GLUCOSE, CAPILLARY Status: Abnormal    Collection Time    10/14/13 10:40 PM   Result  Value  Range    Glucose-Capillary  176 (*)  70 - 99 mg/dL    Comment 1  Notify RN    CREATININE, SERUM Status: Abnormal    Collection Time    10/15/13 5:10 AM   Result  Value  Range    Creatinine, Ser  6.03 (*)  0.50 - 1.10 mg/dL    GFR calc non Af Amer  6 (*)  >90 mL/min    GFR calc Af Amer  8 (*)  >90 mL/min    Comment:  (NOTE)     The eGFR has been calculated using the CKD EPI equation.     This calculation has not been validated in all clinical situations.     eGFR's persistently <90 mL/min signify possible Chronic Kidney     Disease.   CBC Status: Abnormal    Collection Time    10/15/13 7:31 AM   Result  Value  Range    WBC  10.8 (*)  4.0 - 10.5 K/uL    RBC  2.93 (*)  3.87 - 5.11 MIL/uL    Hemoglobin  9.1 (*)  12.0 - 15.0 g/dL    HCT  28.9 (*)  36.0 - 46.0 %    MCV  98.6  78.0 - 100.0 fL    MCH  31.1  26.0 - 34.0 pg    MCHC  31.5  30.0 - 36.0 g/dL    RDW  18.4 (*)  11.5 - 15.5 %    Platelets  200  150 - 400 K/uL   RENAL FUNCTION PANEL Status: Abnormal    Collection Time    10/15/13 7:31 AM   Result  Value  Range    Sodium  139  137 - 147 mEq/L    Potassium  4.0  3.7 -  5.3 mEq/L     Chloride  97  96 - 112 mEq/L    CO2  27  19 - 32 mEq/L    Glucose, Bld  198 (*)  70 - 99 mg/dL    BUN  24 (*)  6 - 23 mg/dL    Creatinine, Ser  5.95 (*)  0.50 - 1.10 mg/dL    Calcium  9.9  8.4 - 10.5 mg/dL    Phosphorus  3.9  2.3 - 4.6 mg/dL    Albumin  2.7 (*)  3.5 - 5.2 g/dL    GFR calc non Af Amer  7 (*)  >90 mL/min    GFR calc Af Amer  8 (*)  >90 mL/min    Comment:  (NOTE)     The eGFR has been calculated using the CKD EPI equation.     This calculation has not been validated in all clinical situations.     eGFR's persistently <90 mL/min signify possible Chronic Kidney     Disease.    No results found.  Post Admission Physician Evaluation:  1. Functional deficits secondary to deconditioning after multiple medical issues. 2. Patient is admitted to receive collaborative, interdisciplinary care between the physiatrist, rehab nursing staff, and therapy team. 3. Patient's level of medical complexity and substantial therapy needs in context of that medical necessity cannot be provided at a lesser intensity of care such as a SNF. 4. Patient has experienced substantial functional loss from his/her baseline which was documented above under the "Functional History" and "Functional Status" headings. Judging by the patient's diagnosis, physical exam, and functional history, the patient has potential for functional progress which will result in measurable gains while on inpatient rehab. These gains will be of substantial and practical use upon discharge in facilitating mobility and self-care at the household level.  5. Physiatrist will provide 24 hour management of medical needs as well as oversight of the therapy plan/treatment and provide guidance as appropriate regarding the interaction of the two. 6. 24 hour rehab nursing will assist with bladder management, bowel management, safety, skin/wound care, disease management, medication administration, pain management and patient education and help  integrate therapy concepts, techniques,education, etc. 7. PT will assess and treat for/with: Lower extremity strength, range of motion, stamina, balance, functional mobility, safety, adaptive techniques and equipment, CV stamina, pain mgt. Goals are: mod I. 8. OT will assess and treat for/with: ADL's, functional mobility, safety, upper extremity strength, adaptive techniques and equipment, CV stamina, pain mgt. Goals are: mod I. 9. SLP will assess and treat for/with: n/a. Goals are: n./a. 10. Case Management and Social Worker will assess and treat for psychological issues and discharge planning. 11. Team conference will be held weekly to assess progress toward goals and to determine barriers to discharge. 12. Patient will receive at least 3 hours of therapy per day at least 5 days per week. 13. ELOS: 7 days  14. Prognosis: excellent   Medical Problem List and Plan:  Deconditioning secondary to cardiac arrest in a patient with end-stage renal disease  1. DVT Prophylaxis/Anticoagulation: Mechanical: Sequential compression devices, below knee Bilateral lower extremities and increase mobility  2. Pain Management: Tramadol prn and tylenol tid for pain.  3. Mood: pt is in good spirits, team to provide egosupport  4. Neuropsych: This patient is capable of making decisions on her own behalf.  5. CAD with h/o recurrent PEA: Has refused further work up. Continue tramadol for chest wall pain due to CPR.  6. Recurrent GIB:  Continue PPI bid and pepcid at HS.  7. ESRD: HD ongoing M, W, F. Able to maintain BP with proamatine prior to procedure and other adjustments to HD process. Check daily weights. Modified diet with FR.  8. L-ICA stenosis: To follow up with Dr. Estanislado Pandy past discharge.  9. 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  11. Acute on chronic anemia: Monitor with routine checks and transfuse prn. Continue aranesp.  12.DM type 2: Will monitor with  ac/hs checks. Continue SSI for now. Used amaryl 4 mg at home.   Meredith Staggers, MD, [facility]   10/15/2013

## 2013-10-15 NOTE — Discharge Summary (Signed)
Physician Discharge Summary  Briana Gould ZOX:[id number] DOB: [birth year] DOA: 09/20/2013  PCP: [name], MD  Admit date: 09/20/2013 Discharge date: 10/15/2013  Time spent: 35 minutes  Recommendations for Outpatient Follow-up:  Needs to follow up with Dr Corliss Skains for further evaluation of arteriogram.  Follow up with Dr Elnoria Howard with GI.   Discharge Diagnoses:    Cardiac arrest   ESRD (end stage renal disease)   Acute respiratory failure with hypoxia   Hypertension   Encephalopathy acute   H/O: GI bleed   CVA (cerebral infarction)   Paroxysmal a-fib   Preop cardiovascular exam   Discharge Condition: Stable.   Diet recommendation: Renal diet.   Filed Weights   10/15/13 0559 10/15/13 0717 10/15/13 1139  Weight: 82.464 kg (181 lb 12.8 oz) 83.2 kg (183 lb 6.8 oz) 81.1 kg (178 lb 12.7 oz)    History of present illness:  68 year old female w/ ESRD, recently [facility] after being treated for UGIB while on coumadin for Right IJ thrombus stroke prevention. Was in usual state of health, getting stronger after her d/c. On 1/12 was at regular scheduled HD. About 20 minutes into HD she slumped over and became unresponsive. Initial rhythm was reported as PEA. CPR started. Reported 20 minutes CPR before ROSC, no drugs. Transferred to [facility] s/p arrest.    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 Readmitted for PEA arrest while on dialysis. Patient cardiac arrest etiology is not fully determined, question carotid stenosis, severe pulm HTN or severe CAD may be contributing when she is becoming hypotensivie during dialysis sessions, cardiac arrytmia. Plan is for intracranial angiogram  and or possible stent to  see if that help with circulation out patient. This will be reevaluated by Dr Lowell Guitar and Dr Web on outpatient basis.  Stent had to be cancelled due to upper GI Bleed on 1/30 necessitating we stop ASA and Brillinta. Cards has been consulted and is considering further cardiac work up on this patient and even the possibility of a PPM. Patient decline cardiac cath and Pacemaker placement. Patient to be transfer to CIR for further rehabilitation.   Assessment/Plan:   -A fib: RVR with subsequent cardiac pause 6.8 sec on 1-23 after dialysis. Patient Blood pressure decrease during dialysis 1-23. HR increased. She received IV bolus. Cardiology consulted. She was on coumadin at some point but this was stop due to bleed. Difficult situation with anticoagulation. Patient converted spontaneously. She had a pause of 6.8 sec. She didn't received any medications. No good candidate for pacer per cardio evaluation. No further evaluation per cardio.  -now sinus rhythm.  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  - Multiple times - etiology not fully determined, but suspect related to cerebral hypoperfusion during HD with volume and BP shifts.  -No further evaluation per cardio.   Carotid artery stenosis - 1-39% stenosis involving the left internal carotid artery. - Findings consistent with 60-79% right ICA stenosis  - interventional radiology has been consulted, cerebral angiogram done by Dr. Corliss Skains.  -Severe stenosis of LT ICA pet cavernous junction of 95% , 60 % stenosis of RT ICA prox.. 50 % steosis  of LT ICA petrous seg. Occluded RT VBJ  -Plan for intracranial angiogram and or possible stent 1-29.  -Per Dr. Corliss Skains: resistant to plavix. Switch to Brilinta and reschedule angiogram for outpatient in in 2 to 3 weeks. Needs to follow up with Dr Corliss Skains. -Further discussion to take place with Dr Lowell Guitar.    Upper GI Bleed  -1/30: had a episode of coffee  ground emesis (aprox 1 L).  -Will DC ASA and Brillinta.  -Continue with PPI. protonix BID.  -Had EGD in 12/14 with findings of esophagitis, antral and duodenal ulcers.  -Hemodynamically stable.   -Leukocytosis; Resolved   -Chest pain: resolved.  -Musculoskeletal likely due to CPR with chest wall tenderness improving:  - Placed her on tramadol as needed and Tylenol 500mg  TID.   -ESRD (end stage renal disease)  - per nephrology.  -Exploring alternate methods to HD. Considering home dialysis.  -Goal is to avoid hypotension during dialysis.   -Acute Respiratory failure with hypoxia/ Pulmonary edema/ diastolic CHF  - s/p arrest  - nephrology team to manage fluid balance   Severe Pulmonary HTN with moderate TR, CAD  - cath in 2011 revealed diffuse distal disease  Right IJ thrombus  -found on a previous admission- not a candidate for anticoagulation due to PUD with bleed in 12/14 in setting of Coumadin  PUD - cont Pepcid  Hypertension  - Hydralazine PRN. Careful with Hypotension.  Encephalopathy acute - likely due to anoxia secondary to cardiac arrest- may have new cognitive baseline-  - Levaquin started on 1/16- continue for 5 days . Finished course.  -Resolved.  Severe PAD with left BKA.    SIGNIFICANT EVENTS / STUDIES:  1/12 cardiac arrest/ PEA at HD. 20 minutes estimated to ROSC  1/12 Echocardiogram: LVEF 50-55%. No RWMAs. Moderate LVH. Probably mild AS. Mild MR. RVSP est 69 mmHg  1/12 CT head: Age uncertain small infarct mid pons. Small infarct in this area may be recent.  1/13 Cognition much improved. Failed SBT  1/15 Extubated successfully  1/20: Cerebral angiogram done  1/30: Upper GI Bleed (coffee-ground emesis).   Consultations:  Cardiology  Renal  IR  Discharge Exam: Filed Vitals:   10/15/13 1139  BP: 150/65  Pulse: 76  Temp: 97.9 F (36.6 C)  Resp: 13    General: No distress.  Cardiovascular: S 1, S 2 RRR Respiratory: CTA  Discharge  Instructions     Medication List    STOP taking these medications       amLODipine 5 MG tablet  Commonly known as:  NORVASC     doxazosin 8 MG tablet  Commonly known as:  CARDURA     famotidine 40 MG tablet  Commonly known as:  PEPCID     hydrALAZINE 50 MG tablet  Commonly known as:  APRESOLINE     lisinopril 40 MG tablet  Commonly known as:  PRINIVIL,ZESTRIL      TAKE these medications       b complex-vitamin c-folic acid 0.8 MG Tabs tablet  Take 1 tablet by mouth daily.     feeding supplement (NEPRO CARB STEADY) Liqd  Take 237 mLs by mouth daily.     sevelamer carbonate 800 MG tablet  Commonly known as:  RENVELA  Take 1,600 mg by mouth 3 (three) times daily with meals.     traMADol 50 MG tablet  Commonly known as:  ULTRAM  Take 50 mg by mouth every 12 (twelve) hours as needed (for pain).  No Known Allergies    The results of significant diagnostics from this hospitalization (including imaging, microbiology, ancillary and laboratory) are listed below for reference.    Significant Diagnostic Studies: Ct Head Wo Contrast  09/20/2013   CLINICAL DATA:  Altered mental status ; status post cardiopulmonary resuscitation  EXAM: CT HEAD WITHOUT CONTRAST  TECHNIQUE: Contiguous axial images were obtained from the base of the skull through the vertex without intravenous contrast.  COMPARISON:  None.  FINDINGS: There is age related volume loss. There is no demonstrable mass, hemorrhage, extra-axial fluid collection, or midline shift. There is mild small vessel disease in the centra semiovale bilaterally. There is decreased attenuation in the midline of the upper to mid pons which may represent a small recent infarct in this area. No other acute appearing infarct is appreciable.  Bony calvarium appears intact.  The mastoid air cells are clear.  IMPRESSION: Age uncertain small infarct mid pons. Small infarct in this area may be recent.  There is underlying age related volume  loss with mild periventricular small vessel disease. There is no intracranial mass or hemorrhage. No gray-white compartment edema is appreciable on this study.   Electronically Signed   By: Bretta Bang M.D.   On: 09/20/2013 12:54   Dg Chest Port 1 View  09/25/2013   CLINICAL DATA:  Edema after dialysis.  EXAM: PORTABLE CHEST - 1 VIEW  COMPARISON:  One-view chest 09/24/2013.  FINDINGS: Cardiac enlargement is stable. A left IJ line is stable in position. Interstitial edema is improved. No focal airspace consolidation is evident.  IMPRESSION: 1. Improved interstitial edema. 2. Stable cardiomegaly.   Electronically Signed   By: Gennette Pac M.D.   On: 09/25/2013 21:55   Dg Chest Port 1 View  09/24/2013   CLINICAL DATA:  Respiratory failure  EXAM: PORTABLE CHEST - 1 VIEW  COMPARISON:  09/23/2013  FINDINGS: Interval tracheal and esophageal extubation. Left IJ catheter in stable position, tip at the superior cavoatrial junction.  Unchanged cardiomegaly and pulmonary edema. These changes could obscure superimposed infection. No evidence of increased pleural fluid. No pneumothorax.  IMPRESSION: 1. Stable lung volumes after tracheal extubation. 2. Unchanged pulmonary edema, which could obscure superimposed consolidation.   Electronically Signed   By: Tiburcio Pea M.D.   On: 09/24/2013 06:23   Dg Chest Port 1 View  09/23/2013   CLINICAL DATA:  Respiratory failure.  EXAM: PORTABLE CHEST - 1 VIEW  COMPARISON:  09/22/2013.  FINDINGS: Endotracheal tube, left IJ line, NG tube in stable position. Persistent cardiomegaly and pulmonary venous congestion present. Interstitial prominence present. These findings consistent with congestive heart failure. Left base atelectasis and/or infiltrate present. Left pleural effusion has improved. No pneumothorax.  IMPRESSION: 1. Stable line and tube positions. 2. Findings consistent with congestive heart failure with pulmonary interstitial edema again noted. Left pleural  effusion has improved. 3. Left lower lobe atelectasis and/or infiltrate.   Electronically Signed   By: Maisie Fus  Register   On: 09/23/2013 07:36   Dg Chest Port 1 View  09/22/2013   CLINICAL DATA:  Respiratory failure.  EXAM: PORTABLE CHEST - 1 VIEW  COMPARISON:  Chest radiograph September 21, 2013  FINDINGS: Endotracheal tube tip projects 4.2 cm above the carina. Left internal jugular central venous catheter with distal tip at the cavoatrial junction. Nasogastric tube past the gastroesophageal junction region code though, tip is not visualized. No pneumothorax.  Stable cardiomegaly, worsening interstitial edema, with minimal patchy airspace opacity in left lung base, unchanged. Suspected small  left pleural effusion. Soft tissue planes and included osseous structures are unchanged.  IMPRESSION: No apparent change in life-support lines.  Stable cardiomegaly with slightly worsening interstitial edema. Retrocardiac airspace opacity may reflect atelectasis or confluent edema with suspected small left pleural effusion.   Electronically Signed   By: Awilda Metroourtnay  Bloomer   On: 09/22/2013 06:31   Dg Chest Port 1 View  09/21/2013   CLINICAL DATA:  Respiratory distress.  Intubated patient.  EXAM: PORTABLE CHEST - 1 VIEW  COMPARISON:  09/20/2013  FINDINGS: Endotracheal tube tip lies 3 cm above the carina. Nasogastric tube passes below the diaphragm well into the stomach. Left internal jugular central venous line tip lies the caval atrial junction.  Mild hazy airspace opacity is noted in the medial right lung base. This may reflect atelectasis. Infiltrate is possible. Lungs are otherwise clear. No pleural effusion or pneumothorax  IMPRESSION: 1. Support apparatus is stable in well positioned. 2. Mild hazy opacity in the medial right lung base, likely atelectasis. This potentially could reflect a small area of infiltrate or even asymmetric edema. Lungs are otherwise clear. Findings are similar to the prior exam allowing for  differences in patient positioning and technique.   Electronically Signed   By: Amie Portlandavid  Ormond M.D.   On: 09/21/2013 12:27   Dg Chest Port 1 View  09/20/2013   CLINICAL DATA:  Post IJ placement  EXAM: PORTABLE CHEST - 1 VIEW  COMPARISON:  Prior chest x-ray 09/20/2013 at 9:19 a.m.  FINDINGS: Interval placement of a left IJ approach central venous catheter. The tip of the catheter is in good position at the superior cavoatrial junction. No evidence of complicating pneumothorax. The patient remains intubated. The tip of the endotracheal tube is 4 cm above the carina. A nasogastric tube is present, the tip lies off the field of view presumably within the stomach. External defibrillator pads and metallic artifacts overlie the chest. Stable cardiomegaly, very low inspiratory volumes and mild pulmonary edema.  IMPRESSION: 1. New left IJ approach central venous catheter with the tip at the superior cavoatrial junction. No evidence of pneumothorax or other complication. 2. Otherwise, no significant interval change in the appearance of the chest.   Electronically Signed   By: Malachy MoanHeath  McCullough M.D.   On: 09/20/2013 11:48   Dg Chest Portable 1 View  09/20/2013   CLINICAL DATA:  Status post intubation.  EXAM: PORTABLE CHEST - 1 VIEW  COMPARISON:  None.  FINDINGS: 0919 hrs. Endotracheal tube tip is 3.8 cm above the base of the carina. The NG tube passes into the stomach although the distal tip position is not included on the film. Lung volumes are low. The cardio pericardial silhouette is enlarged. Diffuse interstitial opacity is associated with vascular congestion. Telemetry leads overlie the chest. No evidence for pneumothorax or substantial pleural effusion.  IMPRESSION: Low volume film with enlargement of the cardiopericardial silhouette. Vascular congestion with interstitial opacities suggest associated edema.   Electronically Signed   By: Kennith CenterEric  Mansell M.D.   On: 09/20/2013 09:30   Ir Angio Intra Extracran Sel Com  Carotid Innominate Bilat Mod Sed  09/30/2013   CLINICAL DATA:  Patient with transient left-sided weakness. Abnormal MRA of the brain and neck.  EXAM: BILATERAL COMMON CAROTID AND INNOMINATE ANGIOGRAPHY AND BILATERAL VERTEBRAL ARTERY ANGIOGRAMS:  MEDICATIONS: Versed 1 mg IV. Fentanyl 25 mcg IV.  ANESTHESIA/SEDATION: Conscious sedation.  CONTRAST:  80mL OMNIPAQUE IOHEXOL 300 MG/ML  SOLN  PROCEDURE: Following a full explanation of the procedure along with  the potential associated complications, an informed witnessed consent was obtained.  The right groin was prepped and draped in the usual sterile fashion. Thereafter using modified Seldinger technique, transfemoral access into the right common femoral artery was obtained without difficulty. Over a 0.035 inch guidewire, a 5 French Pinnacle sheath was inserted. Through this and also over a 0.035 inch guidewire, a 5 French JB1 catheter was advanced into the aortic arch region and selectively positioned in the right common carotid artery, the right subclavian artery, the left common carotid artery and the left subclavian artery.  There were no acute complications. The patient tolerated the procedure well.  COMPLICATIONS: None immediate.  FINDINGS: The right common carotid arteriogram demonstrates the right external carotid artery and its major branches to be normal.  The right internal carotid artery at the bulb is patent. Just distal to the bulb there is a focal stenosis of 65% - 70%.  Distal to this the vessel opacifies normally to the cranial skull base.  There is normal opacification of the right ICA in the distal cervical segment.  Mild narrowing is seen of the cervical petrous junction and the cavernous petrous junction.  The cavernous segment distally appears widely patent. The supraclinoid segment is also widely patent.  The right middle cerebral artery proximally just distal to its origin demonstrates a 50% stenosis. A 40% stenosis is seen of the right MCA just  distal to the anterior temporal branch.  The right middle cerebral artery distribution and the right anterior cerebral artery distribution distally demonstrates normal opacification into the capillary and venous phases. Cross filling via the anterior communicating artery of the left anterior cerebral artery A2 segment is seen. Occlusion of the left anterior cerebral artery A1 segment is noted.  The right vertebral artery origin is normal.  The vessel opacifies to the cranial skull base where it terminates primarily in the ipsilateral right posterior-inferior cerebellar artery.  No flow is seen into the right vertebrobasilar junction.  The left vertebral artery origin is widely patent.  The vessel opacifies to the cranial skull base.  There is normal opacification of the left posterior inferior cerebellar artery and the left vertebrobasilar junction.  Opacification of the basilar artery, the superior cerebellar arteries, the posterior cerebral arteries and anterior inferior cerebellar arteries is seen. Also noted is the stump of the occluded right vertebrobasilar junction.  The posterior cerebral arteries demonstrate moderate narrowing in the P2 segments and the P3 segments consistent with intracranial arteriosclerosis.  The left common carotid arteriogram demonstrates the left external carotid artery to be narrowed by 50%. Its branches are normal.  The left internal carotid artery at the bulb to the cranial skull base opacifies normally.  There is mild narrowing of the distal cervical vertical segment.  A 50% stenosis is noted of the distal horizontal segment of the petrous portion, with a 95% plus focal stenosis of the left internal carotid artery in the petrous cavernous junction.  The cavernous segment distal to this remains widely patent as does the supraclinoid segment.  The left middle cerebral artery opacifies into the capillary and venous phases.  Nonvisualization of the left anterior cerebral artery A1  segment is seen consistent with occlusion.  IMPRESSION: 95% plus focal stenoses of the left internal carotid artery petrous cavernous junction, with approximately 50% stenosis of the horizontal segment of the petrous portion of the left internal carotid artery.  Occluded right vertebrobasilar junction distal to the right posterior-inferior cerebellar artery.  50% stenosis of the right middle  cerebral artery proximally. Approximately 65% percent stenosis the right internal carotid artery just distal to the bulb.   Electronically Signed   By: Julieanne Cotton M.D.   On: 09/28/2013 13:53    Microbiology: No results found for this or any previous visit (from the past 240 hour(s)).   Labs: Basic Metabolic Panel:  Recent Labs Lab 10/09/13 1411 10/11/13 1243 10/13/13 0835 10/15/13 0510 10/15/13 0731  NA 135* 136* 139  --  139  K 5.2 5.3 4.1  --  4.0  CL 91* 94* 97  --  97  CO2 22 25 25   --  27  GLUCOSE 209* 160* 180*  --  198*  BUN 101* 45* 25*  --  24*  CREATININE 7.79* 6.24* 5.61* 6.03* 5.95*  CALCIUM 9.6 9.7 9.8  --  9.9  PHOS 3.7 3.8 4.1  --  3.9   Liver Function Tests:  Recent Labs Lab 10/09/13 1411 10/11/13 1243 10/13/13 0835 10/15/13 0731  ALBUMIN 2.7* 3.0* 3.1* 2.7*   No results found for this basename: LIPASE, AMYLASE,  in the last 168 hours No results found for this basename: AMMONIA,  in the last 168 hours CBC:  Recent Labs Lab 10/09/13 0420 10/10/13 0530 10/11/13 1243 10/13/13 0834 10/15/13 0731  WBC 15.0* 10.7* 11.4* 10.0 10.8*  HGB 7.8* 8.6* 8.9* 9.2* 9.1*  HCT 23.4* 25.5* 27.5* 28.4* 28.9*  MCV 95.9 93.4 95.8 97.3 98.6  PLT 246 201 230 213 200   Cardiac Enzymes: No results found for this basename: CKTOTAL, CKMB, CKMBINDEX, TROPONINI,  in the last 168 hours BNP: BNP (last 3 results)  Recent Labs  09/20/13 0913 09/20/13 1155  PROBNP 57457.0* 47172.0*   CBG:  Recent Labs Lab 10/13/13 1953 10/14/13 0742 10/14/13 1150 10/14/13 1624  10/14/13 2240  GLUCAP 162* 136* 207* 141* 176*       Signed:  Raed Schalk  [facility] 10/15/2013, 1:09 PM

## 2013-10-15 NOTE — Progress Notes (Signed)
Dr.Henry Malissa HippoW Smith III(cardiologist) office was call to clarify NPO orders for procedure tomorrow,RN was call back by the on call MD. Orders were received to put diet orders back,as per Cardiologist on call,Pam Love PA, and the pt. ,she will not go for any procedure tomorrow.

## 2013-10-15 NOTE — PMR Pre-admission (Signed)
PMR Admission Coordinator Pre-Admission Assessment  Patient: Briana Gould is an 68 y.o., female MRN: [id number] DOB: [birth year] Height: 5\' 6"  (167.6 cm) Weight: 83.2 kg (183 lb 6.8 oz)              Insurance Information HMO:     PPO:      PCP:      IPA:      80/20: yes     OTHER: no HMO PRIMARY: Medicare a and b      Policy#: [id number]      Subscriber: pt Benefits:  Phone #: online     Name: 09/30/13 Eff. Date: 09/09/10   Deduct: $1260      Out of Pocket Max: none      Life Max: none CIR: 100%      SNF: 20 full days Outpatient: 80%     Co-Pay: 20% Home Health: 100%      Co-Pay: none DME: 80%     Co-Pay: 20% Providers: pt choice  SECONDARY: [facility]      Policy#: [id number]      Subscriber: Briana Gould: [place] [facility] [place] [id number] Briana Gould:  Briana Gould for Life  [id number]  [facility]  Emergency Contact Information Contact Information   Name Relation Home Work [place] Daughter [phone]  [phone]   Briana Gould, Briana Gould [phone]       Current Medical History  Patient Admitting Diagnosis: Deconditioning secondary to cardiac arrest in a patient with end-stage renal disease  History of Present Illness:Briana Gould is a 68 y.o. 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   Patient did receive CPR x20 minutes. Cranial CT scan showed age uncertain a small infarct mid pons. Small infarct in this area felt to be possibly reent. Echocardiogram ejection fraction of 55%. There is no ST segment elevation on EKG. MRA showed significant intracranial atherosclerotic type changes. Carotid Dopplers 60-79%  right ICA stenosis. Patient not felt to be a surgical intervention candidate. EEG showed no seizure activity. 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  Patient was extubated 09/23/2013.   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 60% stenosis of right ICA approximately and 50% stenosis left ICA petrous segment. Stent had to be cancelled due to upper GI bleed on 10/08/13 necessitating to stop ASA and Brillinta. Per Dr. Corliss Skains: resistant to plavix. Switch to Brilinta and reschedule angiogram for outpatient in in 2 to 3 weeks. Needs to follow up with Dr Corliss Skains.    Patient cardiac arrest etiology is not fully determined, question carotid stenosis, severe pulm HTN or severe CAD may be contributing when she is becoming hypotensivie during dialysis sessions, cardiac arrytmia.   A fib: RVR with subsequent cardiac pause 6.8 sec on 1-23 after dialysis. Patient Blood pressure decrease during dialysis 1-23. HR increased. She received IV bolus. Cardiology consulted. She was on coumadin at some point but this was stop due to bleed. Difficult situation with anticoagulation. Patient converted spontaneously. She had a pause of 6.8 sec. She didn't received any medications. No good candidate for pacer per cardio evaluation. No further evaluation per cardio.  -  now sinus rhythm.  -Exploring alternate methods to HD: surgery has refused to place PD catheter due to risks with anesthesia. Plan is for pt to return to her outpatient  Center and continued discussions with pt and her daughter concerning the possibility of home hemodialysis.  Past Medical History  Past Medical History  Diagnosis Date  . End stage renal disease on dialysis   . Peripheral arterial occlusive disease   . Hypertension   . Cardiac arrest   . Diabetes mellitus type 2, uncontrolled, with  complications   . DVT (deep venous thrombosis)      Right internal jugular vein  . CAD (coronary artery disease), native coronary artery 2011    Diffuse distal vessel CAD noted at cardiac catheterization    Family History  family history is not on file.  Prior Rehab/Hospitalizations: home health and pt states inpt rehab after BKA  Current Medications  Current facility-administered medications:0.9 %  sodium chloride infusion, , Intravenous, Continuous PRN, Merwyn Katosavid B Simonds, MD;  0.9 %  sodium chloride infusion, 100 mL, Intravenous, PRN, Garnetta BuddyMartin W Webb, MD;  0.9 %  sodium chloride infusion, 100 mL, Intravenous, PRN, Garnetta BuddyMartin W Webb, MD;  acetaminophen (TYLENOL) tablet 325-650 mg, 325-650 mg, Oral, Q4H PRN, Jacquelynn Creeamela S Love, PA-C, 650 mg at 10/08/13 [id number] acetaminophen (TYLENOL) tablet 500 mg, 500 mg, Oral, TID, Ripudeep K Rai, MD, 500 mg at 10/14/13 2122;  alteplase (CATHFLO ACTIVASE) injection 2 mg, 2 mg, Intracatheter, Once, Belkys A Regalado, MD;  alteplase (CATHFLO ACTIVASE) injection 2 mg, 2 mg, Intracatheter, Once PRN, Garnetta BuddyMartin W Webb, MD;  aluminum hydroxide (AMPHOJEL/ALTERNAGEL) suspension 15 mL, 15 mL, Oral, Q6H PRN, Evlyn KannerPamela S Love, PA-C bisacodyl (DULCOLAX) suppository 10 mg, 10 mg, Rectal, Daily PRN, Pamela S Love, PA-C;  darbepoetin (ARANESP) injection 200 mcg, 200 mcg, Intravenous, Q Wed-HD, Maree Krabbeobert D Schertz, MD, 200 mcg at 10/13/13 0932;  diphenhydrAMINE (BENADRYL) 12.5 MG/5ML elixir 12.5-25 mg, 12.5-25 mg, Oral, Q6H PRN, Evlyn KannerPamela S Love, PA-C;  docusate sodium (COLACE) capsule 100 mg, 100 mg, Oral, BID, Estela Isaiah BlakesY Hernandez Acosta, MD, 100 mg at 10/14/13 2122 doxercalciferol (HECTOROL) injection 8 mcg, 8 mcg, Intravenous, Q M,W,F-HD, Jacquelynn Creeamela S Love, PA-C, 8 mcg at 10/15/13 [id number];  famotidine (PEPCID) tablet 20 mg, 20 mg, Oral, QHS, Pamela S Love, PA-C, 20 mg at 10/14/13 2122;  feeding supplement (NEPRO CARB STEADY) liquid 237 mL, 237 mL, Oral, QPC supper, Hettie HolsteinKimberly Alverson Harris, RD, 237 mL at 10/14/13  1800;  feeding supplement (NEPRO CARB STEADY) liquid 237 mL, 237 mL, Oral, PRN, Garnetta BuddyMartin W Webb, MD feeding supplement (NEPRO CARB STEADY) liquid 237 mL, 237 mL, Oral, PRN, Garnetta BuddyMartin W Webb, MD;  guaiFENesin-dextromethorphan (ROBITUSSIN DM) 100-10 MG/5ML syrup 5-10 mL, 5-10 mL, Oral, Q6H PRN, Evlyn KannerPamela S Love, PA-C;  heparin injection 1,000 Units, 1,000 Units, Dialysis, PRN, Garnetta BuddyMartin W Webb, MD;  insulin aspart (novoLOG) injection 0-9 Units, 0-9 Units, Subcutaneous, TID WC, Belkys A Regalado, MD, 1 Units at 10/14/13 1723 lidocaine (PF) (XYLOCAINE) 1 % injection 5 mL, 5 mL, Intradermal, PRN, Garnetta BuddyMartin W Webb, MD;  lidocaine (PF) (XYLOCAINE) 1 % injection 5 mL, 5 mL, Intradermal, PRN, Garnetta BuddyMartin W Webb, MD;  lidocaine-prilocaine (EMLA) cream 1 application, 1 application, Topical, PRN, Garnetta BuddyMartin W Webb, MD;  midodrine (PROAMATINE) tablet 5 mg, 5 mg, Oral, Q M,W,F-HD, Pamela S Love, PA-C, 5 mg at 10/13/13 1331 multivitamin (RENA-VIT) tablet 1 tablet, 1 tablet, Oral, QHS, Pamela S Love, PA-C, 1 tablet at 10/14/13 2122;  ondansetron (ZOFRAN) injection 4 mg, 4 mg,  Intravenous, Q6H PRN, Nelda Bucks, MD, 4 mg at 10/08/13 1308;  pantoprazole (PROTONIX) injection 40 mg, 40 mg, Intravenous, Q12H, Henderson Cloud, MD, 40 mg at 10/14/13 2122 pentafluoroprop-tetrafluoroeth (GEBAUERS) aerosol 1 application, 1 application, Topical, PRN, Garnetta Buddy, MD;  prochlorperazine (COMPAZINE) injection 5-10 mg, 5-10 mg, Intramuscular, Q6H PRN, Evlyn Kanner Love, PA-C;  prochlorperazine (COMPAZINE) suppository 12.5 mg, 12.5 mg, Rectal, Q6H PRN, Evlyn Kanner Love, PA-C;  prochlorperazine (COMPAZINE) tablet 5-10 mg, 5-10 mg, Oral, Q6H PRN, Jacquelynn Cree, PA-C RESOURCE THICKENUP CLEAR, , Oral, PRN, Jacquelynn Cree, PA-C;  sevelamer carbonate (RENVELA) tablet 1,600 mg, 1,600 mg, Oral, TID WC, Pamela S Love, PA-C, 1,600 mg at 10/14/13 1723;  sodium chloride 0.9 % injection 10-40 mL, 10-40 mL, Intracatheter, PRN, Belkys A Regalado, MD, 10 mL at 10/04/13  2018;  traMADol (ULTRAM) tablet 50 mg, 50 mg, Oral, Q8H PRN, Jacquelynn Cree, PA-C  Patients Current Diet: Renal  Precautions / Restrictions Precautions Precautions: Fall Other Brace/Splint: LLE prosthesis Restrictions Weight Bearing Restrictions: No   Prior Activity Level Community (5-7x/wk): pt drove self to dialysis 3 times per week, caregiver to 3 grandchildren 8, 30, and 11 yo  Journalist, newspaper / Corporate investment banker Devices/Equipment: Paediatric nurse with back;Wheelchair;Walker (specify type);Cane (specify quad or straight);Eyeglasses;Prosthesis Home Equipment: Walker - 2 wheels;Wheelchair - Manufacturing systems engineer - built in  Prior Functional Level Prior Function Level of Independence: Independent Comments: PTA pt and sister reports that pt was independent with mobility and ADLs with Lt prosthesis   Current Functional Level Cognition  Overall Cognitive Status: Within Functional Limits for tasks assessed Current Attention Level: Alternating Orientation Level: Oriented X4 Following Commands: Follows one step commands with increased time Safety/Judgement: Decreased awareness of deficits;Decreased awareness of safety General Comments: pt's cognitive status has improved significantly    Extremity Assessment (includes Sensation/Coordination)          ADLs  Eating/Feeding: Independent Where Assessed - Eating/Feeding: Edge of bed Grooming: Wash/dry face;Teeth care;Denture care;Minimal assistance;Wash/dry hands;Brushing hair (Min A for balance while standing; brushed hair sitting-setup) Where Assessed - Grooming: Supported standing;Supported sitting Upper Body Bathing: Set up;Supervision/safety Where Assessed - Upper Body Bathing: Unsupported sitting Lower Body Bathing: Minimal assistance Where Assessed - Lower Body Bathing: Supported sit to stand Upper Body Dressing: Set up Where Assessed - Upper Body Dressing: Unsupported sitting Lower Body Dressing: Minimal  assistance Where Assessed - Lower Body Dressing: Supported sit to Pharmacist, hospital: Moderate assistance (from lower commode) Toilet Transfer Method: Sit to stand;Stand pivot Toilet Transfer Equipment: Regular height toilet;Grab bars Toileting - Clothing Manipulation and Hygiene: Supervision/safety Where Assessed - Engineer, mining and Hygiene: Lean right and/or left Tub/Shower Transfer Method: Not assessed Equipment Used: Gait belt;Rolling walker;Other (comment) (prosthetic) Transfers/Ambulation Related to ADLs: min A. Ambulating with RW ADL Comments: Limited by poor endurance    Mobility  Overal bed mobility: Modified Independent Bed Mobility: Supine to Sit;Sit to Supine Supine to sit: Supervision Sit to supine: Min assist General bed mobility comments: improved ability to get EOB, no physical assist required, only use of rail and HOB slightly elevated    Transfers  Overall transfer level: Needs assistance Equipment used: Rolling walker (2 wheeled) Transfers: Sit to/from Stand Sit to Stand: Mod assist Stand pivot transfers: +2 safety/equipment;Min assist General transfer comment: mod assist to support trunk to get to standing from lower recliner chair.  Even when seat boosted with blankets pt struggled to get up from sitting.  Verbal cues for hand placement  and forward momentum of trunk over legs.      Ambulation / Gait / Stairs / Wheelchair Mobility  Ambulation/Gait Ambulation Distance (Feet): 85 Feet Gait velocity: decreased General Gait Details: one standing rest break needed due to fatigue 3/4th of the way through the walk.      Posture / Balance Dynamic Sitting Balance Sitting balance - Comments: sitting balance improved    Special needs/care consideration Dialysis HD MWF Bowel mgmt: continent Bladder mgmt: very little Diabetic mgmt     Previous Home Environment Living Arrangements: Spouse/significant other;Other relatives;Other (Comment)  Lives  With: Spouse;Family;Other (Comment) Available Help at Discharge: Family Type of Home: House Home Layout: One level Home Access: Ramped entrance Bathroom Shower/Tub: Health visitor: Handicapped height Bathroom Accessibility: Yes How Accessible: Accessible via walker Home Care Services: No Type of Home Care Services: Home PT;Home RN Home Care Agency (if known): [facility] Additional Comments: husband is disabled and cannot provide physical (A)   Discharge Living Setting Plans for Discharge Living Setting: Patient's home;Lives with (comment);Other (Comment) Type of Home at Discharge: House Discharge Home Layout: One level Discharge Home Access: Ramped entrance Discharge Bathroom Shower/Tub: Tub/shower unit Discharge Bathroom Toilet: Standard Discharge Bathroom Accessibility: Yes How Accessible: Accessible via wheelchair Does the patient have any problems obtaining your medications?: No  Social/Family/Support Systems Patient Roles: Spouse;Parent;Caregiver;Other (Comment) Contact Information: Linward Natal, daughter Anticipated Caregiver: [name] prn, spouse no assist, pt's brother down to help from [place] Anticipated Caregiver's Contact Information: [name], daughter Ability/Limitations of Caregiver: supervision but not 24/7 unless brother stays in town Caregiver Availability: Other (Comment) Discharge Plan Discussed with Primary Caregiver: Yes Is Caregiver In Agreement with Plan?: Yes Does Caregiver/Family have Issues with Lodging/Transportation while Pt is in Rehab?: No    Goals/Additional Needs Patient/Family Goal for Rehab: supervision with PT and OT Expected length of stay: ELOS 5 to 7 days Dietary Needs: renal diet Special Service Needs: plan to return to same outpt dialysis center at d/c with continued discussions about possible home hemodialyis months from now. Additional Information: Pt drove self to dialysis pta. Daughter states she will initally drive her  but will likely need to look into transportation service Pt/Family Agrees to Admission and willing to participate: Yes Program Orientation Provided & Reviewed with Pt/Caregiver Including Roles  & Responsibilities: Yes Additional Information Needs: Tedra Coupe, was hired to be there with pt's spouse when pt at dialysis. It has evolved into [employer], cleaning, etc and is usually there 8 hrs per day monday through Friday   Decrease burden of Care through IP rehab admission: n/a  Possible need for SNF placement upon discharge:not anticipated  Patient Condition: This patient's medical and functional status has changed since the consult dated: 09/30/13 in which the Rehabilitation Physician determined and documented that the patient's condition is appropriate for intensive rehabilitative care in an inpatient rehabilitation facility. See "History of Present Illness" (above) for medical update. Functional changes are: overall min assist level. Patient's medical and functional status update has been discussed with the Rehabilitation physician and patient remains appropriate for inpatient rehabilitation. Will admit to inpatient rehab today.  Preadmission Screen Completed By:  Clois Dupes, 10/15/2013 10:29 AM ______________________________________________________________________   Discussed status with Dr. Riley Kill on 10/15/13 at  1028 and received telephone approval for admission today.  Admission Coordinator:  Clois Dupes, time 1610 Date 10/15/13.

## 2013-10-15 NOTE — Procedures (Signed)
I have seen and examined this patient and agree with the plan of care .  68 y/o female with PMH of DM type 2, CAD, severe cerebrovascular disease, right IJ thrombus PAD with b/l BKA, severe carotid disease, Pulm HTN, ESRD on HD and cardiac arrest while on HD who was discharged on 12/21 after being [facility] for an upper GI bleed in setting of Coumadin use (IJ thrombus). Readmitted for PEA arrest while on dialysis. Patient cardiac arrest etiology is not fully determined, question carotid stenosis, severe pulm HTN or severe CAD may be contributing when she is becoming hypotensivie during dialysis sessions, cardiac arrytmia. Plan is for intracranial angiogram and or possible stent 1-29 to see if that help with circulation. Appears to be tolerating in hospital dialysis and looks to be doing better. No further events  Gumecindo Hopkin W 10/15/2013, 9:41 AM

## 2013-10-15 NOTE — Progress Notes (Signed)
I have discussed with Dr. Hyman HopesWebb and Dr. Riley KillSwartz. We will admit pt to inpt rehab today after dialysis. Plan for estimated LOS 5 to 7 days and then d/c home. Patient is aware and in agreement. I contacted her daughter, [name], by phone and she is also in agreement. I have notified Dr. Sunnie Nielsenegalado, RN CM, and SW. [phone]

## 2013-10-16 ENCOUNTER — Inpatient Hospital Stay (HOSPITAL_COMMUNITY): Payer: Self-pay

## 2013-10-16 ENCOUNTER — Inpatient Hospital Stay (HOSPITAL_COMMUNITY): Payer: Medicare Other | Admitting: Occupational Therapy

## 2013-10-16 DIAGNOSIS — R5381 Other malaise: Secondary | ICD-10-CM

## 2013-10-16 DIAGNOSIS — I679 Cerebrovascular disease, unspecified: Secondary | ICD-10-CM

## 2013-10-16 DIAGNOSIS — N186 End stage renal disease: Secondary | ICD-10-CM

## 2013-10-16 LAB — GLUCOSE, CAPILLARY
Glucose-Capillary: 154 mg/dL — ABNORMAL HIGH (ref 70–99)
Glucose-Capillary: 160 mg/dL — ABNORMAL HIGH (ref 70–99)
Glucose-Capillary: 141 mg/dL — ABNORMAL HIGH (ref 70–99)
Glucose-Capillary: 156 mg/dL — ABNORMAL HIGH (ref 70–99)

## 2013-10-16 NOTE — Evaluation (Addendum)
Occupational Therapy Assessment and Plan  Patient Details  Name: Briana Gould MRN: [id number] Date of Birth: 1946-05-22  OT Diagnosis: muscle weakness (generalized) Rehab Potential: Rehab Potential: Good ELOS: 7-9 days   Today's Date: 10/16/2013 Time: 0800-0900; 1300-1330 Time Calculation (min): 60 min; 30 min  Problem List:  Patient Active Problem List   Diagnosis Date Noted  . Physical deconditioning 10/15/2013  . Preop cardiovascular exam 10/11/2013  . CVA (cerebral infarction) 10/01/2013  . Paroxysmal a-fib 10/01/2013  . H/O: GI bleed 09/25/2013  . Cardiac arrest 09/20/2013  . ESRD (end stage renal disease) 09/20/2013  . Acute respiratory failure with hypoxia 09/20/2013  . Hypertension 09/20/2013  . Encephalopathy acute 09/20/2013  . Bradycardia 09/03/2013  . Anemia of chronic renal failure 08/30/2013  . Secondary hyperparathyroidism 08/30/2013  . GIB (gastrointestinal bleeding) 08/29/2013  . CAD (coronary artery disease) 08/16/2013  . History of syncope 08/16/2013  . Aortic stenosis 08/16/2013  . 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 Cerebrovascular disease 08/15/2013  . Thrombocytopenia 08/14/2013  . Seizure 08/13/2013  . PAD (peripheral artery disease) 08/13/2013  . Weakness generalized 11/08/2011  . ESRD (end stage renal disease) on dialysis 11/04/2011  . MITRAL REGURGITATION 10/04/2010  . AORTIC STENOSIS 10/04/2010  . DM 09/13/2010  . HYPERLIPIDEMIA 09/13/2010  . OBESITY 09/13/2010  . HYPERTENSION 09/13/2010  . PULMONARY HYPERTENSION 09/13/2010  . BKA, LEFT LEG 09/13/2010    Past Medical History:  Past Medical History  Diagnosis Date  . PAD (peripheral artery disease)     a. L BKA  . Type II diabetes mellitus   . Anemia   . History of blood transfusion 2002    "related to amputation" (08/13/2013)  . End stage renal disease on dialysis     "[facility]; M, W, F" (08/13/2013)  . DVT (deep venous thrombosis)     a. R IJ DVT  08/2013.  Marland Kitchen CVA (cerebral infarction)     a. MRI 08/2013: possible left motor strip infarct with remote pontine infarct.  . Pulmonary HTN     a. Cath 2011 - mod to severe pulmonary hypertension. b. Echo 08/2013: PAP 42mHg (mild-mod increased).  . Thrombocytopenia   . CAD (coronary artery disease)     a. Severe diffuse diabetic disease by cath 2011, for med rx, not a candidate for CABG or PCI at that time.  . Aortic stenosis   . Syncope     a. 2011, etiology never clear.  . Carotid artery disease     a. Dopp 100/1749 14-49%LICA< 667-59%RICA borderline >80%.  . Cerebrovascular disease     a. 08/2013 MRA brain: multiple bilateral moderate and high grade intracranial stenoses  . Cardiac arrest 08/14/13    a. Possible arrest in setting of hypotension during dialysis with possible arrest requiring brief CPR, not intubated and was fully intact after episode. Suspected due to low cerebral perfusion in setting of hypotension. Unclear if any arrhythmia.  . End stage renal disease on dialysis   . Peripheral arterial occlusive disease   . Hypertension   . Cardiac arrest   . Diabetes mellitus type 2, uncontrolled, with complications   . DVT (deep venous thrombosis)      Right internal jugular vein  . CAD (coronary artery disease), native coronary artery 2011    Diffuse distal vessel CAD noted at cardiac catheterization   Past Surgical History:  Past Surgical History  Procedure Laterality Date  . Av fistula placement Left 2011    upper arm  .  Below knee leg amputation Left 2002  . Tonsillectomy    . Cataract extraction w/ intraocular lens  implant, bilateral Bilateral 2012-2014  . Esophagogastroduodenoscopy N/A 08/30/2013    Procedure: ESOPHAGOGASTRODUODENOSCOPY (EGD);  Surgeon: Beryle Beams, MD;  Location: [facility] ENDOSCOPY;  Service: Endoscopy;  Laterality: N/A;  . Below knee leg amputation Left   . Av fistula placement Left     Assessment & Plan Clinical Impression: Patient is a 68 y.o.  year old female with recent admission to the hospital on 09/20/2013 with PEA arrest.    Per chart review, Briana Gould is a 68 y.o. right-handed female with history of diabetes mellitus, coronary artery disease, CVA with little residual, left below-knee amputation 68 years ago, end-stage renal disease with hemodialysis and cardiac arrest while on hemodialysis who was discharged 08/29/2012 after being treated at [facility] for upper GI bleeding in setting of Coumadin use(IJ thrombus) readmitted 09/20/2013 for PEA arrest. Patient did receive CPR x20 minutes. Cranial CT scan showed age uncertain a small infarct mid pons. Small infarct in this area felt to be possibly recent. Echocardiogram ejection fraction of 55% and no ST segment elevation on EKG. MRA showed significant intracranial atherosclerotic type changes. Carotid Dopplers 60-79% right ICA stenosis. Patient not felt to be a surgical intervention candidate. 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  Patient was extubated 09/23/2013. 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 60% stenosis of right ICA approximately and 50% stenosis left ICA petrous segment. She was started on antiplatelets in anticipation of stent placement but she developed A Fib with RVR on 10/07/13 but did convert to NSR. She developed hematemesis 10/08/13 due to recurrent GIB and required 2 units PRBC. Cardiac cath recommended by Dr. Johnsie Cancel to rule out development of cardiac disease (last cath 4 years ago/ recurrent cardiac arrests) but patient and family declined. Plans for L-ICA stent/angio in the future when stable. Hypotension controlled with midodrine support. Patient deconditioned and has difficulty with transitional movements. Rehab team recommending CIR.  Patient transferred to CIR on  10/15/2013 .    Patient currently requires min with basic self-care skills secondary to muscle weakness.  Prior to hospitalization, patient could complete BADLs with independent , was still driving, and was retired.  Patient used a cane for mobility prior to hospitalization and has a LLE prosthesis.  Patient will benefit from skilled intervention to increase independence with basic self-care skills and increase level of independence with iADL prior to discharge home with care partner.  Anticipate patient will require intermittent supervision and follow up home health.  OT - End of Session Activity Tolerance: Tolerates 30+ min activity with multiple rests Endurance Deficit: Yes OT Assessment Rehab Potential: Good Barriers to Discharge: Decreased caregiver support Barriers to Discharge Comments: Pt reports husband unable to assist with hands-on care due to own cardiac health issues (pt reports husband has pacemaker).  Patient has 3 children in home that are her grandchildren that she reports she and her husband have had since all were born.   OT Patient demonstrates impairments in the following area(s): Balance;Endurance;Edema OT Basic ADL's Functional Problem(s): Grooming;Bathing;Dressing;Toileting OT Advanced ADL's Functional Problem(s): Simple Meal Preparation;Light Housekeeping OT Transfers Functional Problem(s): Toilet;Tub/Shower OT Plan OT Intensity: Minimum of 1-2 x/day, 45 to 90 minutes OT Frequency: Total of 15 hours over 7 days OT Duration/Estimated Length of Stay: 10-12 days OT Treatment/Interventions: Medical illustrator  training;Discharge planning;DME/adaptive equipment instruction;Functional mobility training;Patient/family education;Psychosocial support;Self Care/advanced ADL retraining;Therapeutic Activities;Therapeutic Exercise;UE/LE Strength taining/ROM;UE/LE Coordination activities;Wheelchair propulsion/positioning OT Self Feeding Anticipated Outcome(s): independent OT Basic  Self-Care Anticipated Outcome(s): modified independent OT Toileting Anticipated Outcome(s): modified independent OT Bathroom Transfers Anticipated Outcome(s): supervision to modified independent OT Recommendation Patient destination: Home Follow Up Recommendations: Home health OT Equipment Recommended: To be determined   Skilled Therapeutic Intervention Patient seen in AM for occupational therapy evaluation FIM ADL and self care retraining and instruction.  Patient seen in PM for continued occupational therapy with emphasis on therapeutic activity tolerance, strength, and overall endurance.  Patient sitting upright on side of bed upon therapist arrival this morning.  Vitals obtained (see below).  Patient transfers bed to wheelchair via stand pivot transfer with moderate assistance.  Patient transfers wheelchair to/from transfer tub bench in shower stall via stand pivot with moderate assistance to minimum assistance.  Patient bathes seated with close SBA/supervision.  Patient dresses wheelchair level at the sink.  Patient completes upper body dressing with setup and lower body dressing with maximum assistance.  Patient grooms with setup at the sink.  Patient requires increased rest breaks secondary to patient easily fatigued and poor activity tolerance.  Patient with good safety awareness.    Patient seen in PM for continued OT with emphasis on prosthesis application with increased time, BUE therapeutic activity tolerance tasks to self propel wheelchair from hospital room to/from therapy gym, as well as continued assessment of home environment, vision, UE strength and coordination.    Patient motivated and with rehab potential as good.  OT Evaluation Precautions/Restrictions  Precautions Precautions: Fall Required Braces or Orthoses: Other Brace/Splint Other Brace/Splint: LLE prosthesis Restrictions Weight Bearing Restrictions: No General Chart Reviewed: Yes Vital Signs Therapy  Vitals Temp: 99.3 F (37.4 C) Temp src: Oral Pulse Rate: 82 Resp: 16 BP: 158/73 mmHg Patient Position, if appropriate: Sitting Oxygen Therapy SpO2: 100 % O2 Device: None (Room air) Pain Pain Assessment Pain Assessment: No/denies pain Home Living/Prior Functioning Home Living Family/patient expects to be discharged to:: Private residence Living Arrangements: Spouse/significant other Available Help at Discharge: Family Type of Home: House Home Access: Ramped entrance Home Layout: One level Additional Comments: Husband unable to assist physically at discharge.  Lives With: Spouse;Family (3 children) Prior Function Level of Independence: Independent with basic ADLs Vocation: On disability Leisure: Hobbies-yes (Comment) Comments: PTA pt had housekeeper to assist with IADL cooking and cleaning; likes to knit, crochet, read and scrapbook Vision/Perception  Vision - History Baseline Vision: Bifocals Visual History: Cataracts Patient Visual Report: No change from baseline Vision - Assessment Eye Alignment: Within Functional Limits Perception Perception: Within Functional Limits  Cognition Overall Cognitive Status: Within Functional Limits for tasks assessed Arousal/Alertness: Awake/alert Orientation Level: Oriented X4 Attention: Focused Memory: Appears intact Problem Solving: Appears intact Safety/Judgment: Appears intact Sensation Coordination Gross Motor Movements are Fluid and Coordinated: Yes Fine Motor Movements are Fluid and Coordinated: Yes Finger Nose Finger Test: [facility] Motor  Motor Motor: Within Functional Limits Extremity/Trunk Assessment RUE Assessment RUE Assessment: Exceptions to [facility] RUE AROM (degrees) Overall AROM Right Upper Extremity: Within functional limits for tasks performed RUE Strength RUE Overall Strength: Within Functional Limits for tasks performed (4/5 to 4+/5) RUE Tone RUE Tone: Within Functional Limits LUE Assessment LUE Assessment:  Exceptions to [facility] LUE AROM (degrees) Overall AROM Left Upper Extremity: Within functional limits for tasks assessed LUE Strength LUE Overall Strength: Within Functional Limits for tasks assessed (4/5 to 4+/5) LUE Tone LUE Tone: Within Functional Limits  FIM:  FIM - Eating Eating Activity: 7: Complete independence:no helper FIM - Grooming Grooming Steps: Wash, rinse, dry face;Wash, rinse, dry hands;Oral care, brush teeth, clean dentures;Brush, comb hair Grooming: 5: Set-up assist to obtain items FIM - Bathing Bathing Steps Patient Completed: Chest;Right Arm;Left Arm;Abdomen;Front perineal area;Buttocks;Right upper leg;Left upper leg;Right lower leg (including foot);Left lower leg (including foot) Bathing: 5: Supervision: Safety issues/verbal cues FIM - Upper Body Dressing/Undressing Upper body dressing/undressing steps patient completed: Thread/unthread right sleeve of pullover shirt/dresss;Pull shirt over trunk;Thread/unthread left sleeve of pullover shirt/dress;Put head through opening of pull over shirt/dress Upper body dressing/undressing: 5: Set-up assist to: Obtain clothing/put away FIM - Lower Body Dressing/Undressing Lower body dressing/undressing steps patient completed: Thread/unthread right underwear leg;Thread/unthread left underwear leg;Thread/unthread right pants leg;Thread/unthread left pants leg Lower body dressing/undressing: 2: Max-Patient completed 25-49% of tasks FIM - Toileting Toileting: 0: Activity did not occur FIM - Control and instrumentation engineer Devices: Arm rests;Bed rails Bed/Chair Transfer: 3: Bed > Chair or W/C: Mod A (lift or lower assist) FIM - Toilet Transfers Toilet Transfers: 0-Activity did not occur FIM - Systems developer Devices: Tub transfer bench;Walk in shower;Grab bars Tub/shower Transfers: 3-Into Tub/Shower: Mod A (lift or lower/lift 2 legs)   Refer to Care Plan for Long Term Goals  Recommendations  for other services: None  Discharge Criteria: Patient will be discharged from OT if patient refuses treatment 3 consecutive times without medical reason, if treatment goals not met, if there is a change in medical status, if patient makes no progress towards goals or if patient is discharged from hospital.  The above assessment, treatment plan, treatment alternatives and goals were discussed and mutually agreed upon: by patient  Osa Craver 10/16/2013, 8:29 AM

## 2013-10-16 NOTE — Progress Notes (Signed)
[facility] ROUNDING NOTE   Subjective:   Interval History: no complaints  Objective:  Vital signs in last 24 hours:  Temp:  [97.9 F (36.6 C)-99.3 F (37.4 C)] 99.3 F (37.4 C) (02/07 0550) Pulse Rate:  [80-85] 85 (02/07 0935) Resp:  [15-16] 16 (02/07 0550) BP: (147-163)/(72-79) 152/72 mmHg (02/07 0935) SpO2:  [98 %-100 %] 99 % (02/07 0935) Weight:  [79.8 kg (175 lb 14.8 oz)-80.3 kg (177 lb 0.5 oz)] 80.3 kg (177 lb 0.5 oz) (02/07 0500)  Weight change:  Filed Weights   10/15/13 1536 10/16/13 0500  Weight: 79.8 kg (175 lb 14.8 oz) 80.3 kg (177 lb 0.5 oz)    Intake/Output:     Intake/Output this shift:  Total I/O In: 480 [P.O.:480] Out: -   CVS- RRR RS- CTA ABD- BS present soft non-distended EXT- no edema   Basic Metabolic Panel:  Recent Labs Lab 10/09/13 1411 10/11/13 1243 10/13/13 0835 10/15/13 0510 10/15/13 0731  NA 135* 136* 139  --  139  K 5.2 5.3 4.1  --  4.0  CL 91* 94* 97  --  97  CO2 22 25 25   --  27  GLUCOSE 209* 160* 180*  --  198*  BUN 101* 45* 25*  --  24*  CREATININE 7.79* 6.24* 5.61* 6.03* 5.95*  CALCIUM 9.6 9.7 9.8  --  9.9  PHOS 3.7 3.8 4.1  --  3.9    Liver Function Tests:  Recent Labs Lab 10/09/13 1411 10/11/13 1243 10/13/13 0835 10/15/13 0731  ALBUMIN 2.7* 3.0* 3.1* 2.7*   No results found for this basename: LIPASE, AMYLASE,  in the last 168 hours No results found for this basename: AMMONIA,  in the last 168 hours  CBC:  Recent Labs Lab 10/10/13 0530 10/11/13 1243 10/13/13 0834 10/15/13 0731  WBC 10.7* 11.4* 10.0 10.8*  HGB 8.6* 8.9* 9.2* 9.1*  HCT 25.5* 27.5* 28.4* 28.9*  MCV 93.4 95.8 97.3 98.6  PLT 201 230 213 200    Cardiac Enzymes: No results found for this basename: CKTOTAL, CKMB, CKMBINDEX, TROPONINI,  in the last 168 hours  BNP: No components found with this basename: POCBNP,   CBG:  Recent Labs Lab 10/14/13 2240 10/15/13 1613 10/15/13 2033 10/16/13 0727 10/16/13 1127  GLUCAP  176* 162* 146* 154* 160*    Microbiology: Results for orders placed during the hospital encounter of 09/20/13  MRSA PCR SCREENING     Status: None   Collection Time    09/20/13  1:06 PM      Result Value Range Status   MRSA by PCR NEGATIVE  NEGATIVE Final   Comment:            The GeneXpert MRSA Assay (FDA     approved for NASAL specimens     only), is one component of a     comprehensive MRSA colonization     surveillance program. It is not     intended to diagnose MRSA     infection nor to guide or     monitor treatment for     MRSA infections.    Coagulation Studies: No results found for this basename: LABPROT, INR,  in the last 72 hours  Urinalysis: No results found for this basename: COLORURINE, APPERANCEUR, LABSPEC, PHURINE, GLUCOSEU, HGBUR, BILIRUBINUR, KETONESUR, PROTEINUR, UROBILINOGEN, NITRITE, LEUKOCYTESUR,  in the last 72 hours    Imaging: No results found.   Medications:   . sodium chloride     . [START ON  10/20/2013] darbepoetin (ARANESP) injection - DIALYSIS  200 mcg Intravenous Q Wed-HD  . docusate sodium  100 mg Oral BID  . [START ON 10/18/2013] doxercalciferol  8 mcg Intravenous Q M,W,F-HD  . famotidine  20 mg Oral QHS  . feeding supplement (NEPRO CARB STEADY)  237 mL Oral QPC supper  . insulin aspart  0-9 Units Subcutaneous TID WC  . [START ON 10/18/2013] midodrine  5 mg Oral Q M,W,F-HD  . multivitamin  1 tablet Oral QHS  . sevelamer carbonate  1,600 mg Oral TID WC  . sodium chloride  3 mL Intravenous Q12H   sodium chloride, acetaminophen, aluminum hydroxide, diphenhydrAMINE, guaiFENesin-dextromethorphan, HYDROmorphone (DILAUDID) injection, prochlorperazine, prochlorperazine, prochlorperazine, RESOURCE THICKENUP CLEAR, sodium chloride, sorbitol, traMADol, traZODone  Assessment/ Plan:  68 y/o female with PMH of DM type 2, CAD, severe cerebrovascular disease, right IJ thrombus PAD with b/l BKA, severe carotid disease, Pulm HTN, ESRD on HD and cardiac  arrest while on HD who was discharged on 12/21 after being treated at [facility] for an upper GI bleed in setting of Coumadin use (IJ thrombus). Readmitted for PEA arrest while on dialysis. Patient cardiac arrest etiology is not fully determined, question carotid stenosis, severe pulm HTN or severe CAD may be contributing when she is becoming hypotensivie during dialysis sessions, cardiac arrytmia. Plan is for intracranial angiogram and or possible stent 1-29 to see if that help with circulation. Patient remains high risk but is doing well with inpatient treatments  ESRD Dialysis MWF Unfortunately several cardiac arrests with Hemodialysis now exploring ideas such as Next stage . Complicated patient that is a very high surgical risk  HTN controlled Midodrine support  Anemia darbepoietin transfused  Bones Hectorol and renvela  Carotid disease anticoagulation held for GI bleed. Family and Briana Gould leaning toward medical management  GI bleed PPI x next few days and then retrial anticoagulation Discussed with daughter Briana Gould In room, she would like more information about home therapy and will meet with the home therapy team this a.m. Discussed with Briana Gould and [facility] clinic manager yesterday, Patient weak and quite high risk of further arrest. Recommended in patient treatments         LOS: 1 Briana Gould W @TODAY @1 :03 PM

## 2013-10-16 NOTE — Progress Notes (Signed)
Physical Therapy Session Note  Patient Details  Name: Briana Gould MRN: [id number] Date of Birth: [birth year]  Today's Date: 10/16/2013 Time: [id number] Time Calculation (min): 45 min  Short Term Goals: Week 1:  PT Short Term Goal 1 (Week 1): Pt will be able to propel wheel chair 200 feet at mod I on level surfaces to promote community re-entry, allowing pt to attend follow up MD appointments.  PT Short Term Goal 2 (Week 1): Pt will be able to transfer from various surfaces with supervision to promote OOB activity and increase functional mobility.  PT Short Term Goal 3 (Week 1): Pt will be able to ambulate 50 feet using the LRAD in order to promote functional mobility within the home, allowing pt to ambulate to the bathroom as needed for toileting.  PT Short Term Goal 4 (Week 1): Pt will be able to tolerate 10-15 reps of strenth training exercises of the lower extremities to promote increased strength, ROM and overall improved functional mobility.   Skilled Therapeutic Interventions/Progress Updates:  Pt received in her w/c in room, agreeable to therapy. Pt reports "I was exhausted after this morning. I laid down in the bed and before I knew it, 12:00 had come." Pt completed squat pivot tsf from w/c to mat able with min A and cues for hand placement. Pt completed supine exercises including AP (RLE only), quad set, glut set, SAQ, hip abd, HS (flexin of the LLE), SLR (x10 only) and LAQ. 15-20 reps to tolerance as pt required multiple rest breaks due to fatigue. Pt states, "I can't believe I am so tired." Pt reports she cares for the 3 grandchildren that live with her, gets them ready for school, etc. Pt performed squat pivot from mat table to w/c with min A and returned to room, requesting to remain in w/c. All needs within reach.   Therapy Documentation Precautions:  Precautions Precautions: None Required Braces or Orthoses: Other Brace/Splint Other Brace/Splint: BKA- RLE with prosthesis. Pt  able to donn herself.  Restrictions Weight Bearing Restrictions: No Pain: Pain Assessment Pain Assessment: No/denies pain Pain Score: 0-No pain Patients Stated Pain Goal: 0 Mobility:  Locomotion :  See FIM for current functional status  Therapy/Group: Individual Therapy  Masahiro Iglesia R 10/16/2013, 4:16 PM

## 2013-10-16 NOTE — Progress Notes (Signed)
[name], female   DOB: 11-Jul-1946, 68 y.o.   MRN: [id number]  10/16/13.  Deconditioning.    HPI: Briana Gould is a 68 y.o. 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 Patient did receive CPR x20 minutes. Cranial CT scan showed age uncertain a small infarct mid pons. Small infarct in this area felt to be possibly recent. Echocardiogram ejection fraction of 55% and no ST segment elevation on EKG. MRA showed significant intracranial atherosclerotic type changes. Carotid Dopplers 60-79% right ICA stenosis. Patient not felt to be a surgical intervention candidate.  Patient was extubated 09/23/2013. 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 60% stenosis of right ICA approximately and 50% stenosis left ICA petrous segment. She was started on antiplatelets in anticipation of stent placement but she developed A Fib with RVR on 10/07/13 but did convert to NSR. She developed hematemesis 10/08/13 due to recurrent GIB and required 2 units PRBC. Cardiac cath recommended by Dr. Johnsie Cancel to rule out development of cardiac disease (last cath 4 years ago/ recurrent cardiac arrests) but patient and family declined. Plans for L-ICA stent/angio in the future when stable. Hypotension controlled with midodrine support. Patient deconditioned and has difficulty with transitional movements. Rehab team recommending CIR and patient admitted yesterday (10/15/13).  Review of Systems  HENT: Negative for hearing loss.  Eyes: Positive for blurred vision.  Respiratory: Negative for cough and shortness of  breath.  Cardiovascular: Positive for chest pain. Negative for palpitations.  Gastrointestinal: Negative for heartburn, nausea, vomiting and constipation.  Musculoskeletal: Positive for back pain and neck pain.  Neurological: Negative for dizziness, tingling, focal weakness, seizures and headaches.  Psychiatric/Behavioral: The patient does not have insomnia.  Past Medical History   Diagnosis  Date   .  End stage renal disease on dialysis    .  Peripheral arterial occlusive disease    .  Hypertension    .  Cardiac arrest    .  Diabetes mellitus type 2, uncontrolled, with complications    .  DVT (deep venous thrombosis)      Right internal jugular vein   .  CAD (coronary artery disease), native coronary artery  2011     Diffuse distal vessel CAD noted at cardiac catheterization    Past Surgical History   Procedure  Laterality  Date   .  Below knee leg amputation  Left    .  Av fistula placement  Left     Family History   Problem  Relation  Age of Onset   .  Heart attack  Brother    .  Stroke  Mother    .  Pulmonary embolism  Father     Allergies: No Known Allergies  Medications Prior to Admission   Medication  Sig  Dispense  Refill   .  b complex-vitamin c-folic acid (NEPHRO-VITE) 0.8 MG TABS tablet  Take 1 tablet by mouth daily.     .  Nutritional Supplements (FEEDING SUPPLEMENT, NEPRO CARB STEADY,) LIQD  Take 237 mLs by mouth daily.     .  sevelamer carbonate (RENVELA) 800 MG tablet  Take 1,600 mg by mouth 3 (three) times daily with  meals.     .  traMADol (ULTRAM) 50 MG tablet  Take 50 mg by mouth every 12 (twelve) hours as needed (for pain).     .  [DISCONTINUED] amLODipine (NORVASC) 5 MG tablet  Take 5 mg by mouth daily.     .  [DISCONTINUED] doxazosin (CARDURA) 8 MG tablet  Take 8 mg by mouth daily.     .  [DISCONTINUED] famotidine (PEPCID) 40 MG tablet  Take 40 mg by mouth daily.     .  [DISCONTINUED] hydrALAZINE (APRESOLINE) 50 MG tablet  Take 50 mg by mouth 2 (two) times  daily.     .  [DISCONTINUED] lisinopril (PRINIVIL,ZESTRIL) 40 MG tablet  Take 40 mg by mouth daily.      Physical Exam:  Blood pressure 150/65, pulse 76, temperature 97.9 F (36.6 C), temperature source Oral, resp. rate 13, height 5' 6"  (1.676 m), weight 81.1 kg (178 lb 12.7 oz), SpO2 100.00%.  Physical Exam   Gen- alert; no distress HEENT- neg Chest- clear CV- regular no tachycardia Abd- soft and non-tender Extr- s/p L BKA  Results for orders placed during the hospital encounter of 09/20/13 (from the past 48 hour(s))   GLUCOSE, CAPILLARY Status: Abnormal    Collection Time    10/13/13 5:00 PM   Result  Value  Range    Glucose-Capillary  144 (*)  70 - 99 mg/dL    Comment 1  Notify RN     Comment 2  Documented in Chart    GLUCOSE, CAPILLARY Status: Abnormal    Collection Time    10/13/13 7:53 PM   Result  Value  Range    Glucose-Capillary  162 (*)  70 - 99 mg/dL    Comment 1  Notify RN    GLUCOSE, CAPILLARY Status: Abnormal    Collection Time    10/14/13 7:42 AM   Result  Value  Range    Glucose-Capillary  136 (*)  70 - 99 mg/dL   GLUCOSE, CAPILLARY Status: Abnormal    Collection Time    10/14/13 11:50 AM   Result  Value  Range    Glucose-Capillary  207 (*)  70 - 99 mg/dL   GLUCOSE, CAPILLARY Status: Abnormal    Collection Time    10/14/13 4:24 PM   Result  Value  Range    Glucose-Capillary  141 (*)  70 - 99 mg/dL   GLUCOSE, CAPILLARY Status: Abnormal    Collection Time    10/14/13 10:40 PM   Result  Value  Range    Glucose-Capillary  176 (*)  70 - 99 mg/dL    Comment 1  Notify RN    CREATININE, SERUM Status: Abnormal    Collection Time    10/15/13 5:10 AM   Result  Value  Range    Creatinine, Ser  6.03 (*)  0.50 - 1.10 mg/dL    GFR calc non Af Amer  6 (*)  >90 mL/min    GFR calc Af Amer  8 (*)  >90 mL/min    Comment:  (NOTE)     The eGFR has been calculated using the CKD EPI equation.     This calculation has not been validated in all clinical situations.      eGFR's persistently <90 mL/min signify possible Chronic Kidney     Disease.   CBC Status: Abnormal    Collection Time    10/15/13 7:31 AM   Result  Value  Range    WBC  10.8 (*)  4.0 - 10.5 K/uL    RBC  2.93 (*)  3.87 - 5.11 MIL/uL    Hemoglobin  9.1 (*)  12.0 - 15.0 g/dL    HCT  28.9 (*)  36.0 - 46.0 %    MCV  98.6  78.0 - 100.0 fL    MCH  31.1  26.0 - 34.0 pg    MCHC  31.5  30.0 - 36.0 g/dL    RDW  18.4 (*)  11.5 - 15.5 %    Platelets  200  150 - 400 K/uL   RENAL FUNCTION PANEL Status: Abnormal    Collection Time    10/15/13 7:31 AM   Result  Value  Range    Sodium  139  137 - 147 mEq/L    Potassium  4.0  3.7 - 5.3 mEq/L    Chloride  97  96 - 112 mEq/L    CO2  27  19 - 32 mEq/L    Glucose, Bld  198 (*)  70 - 99 mg/dL    BUN  24 (*)  6 - 23 mg/dL    Creatinine, Ser  5.95 (*)  0.50 - 1.10 mg/dL    Calcium  9.9  8.4 - 10.5 mg/dL    Phosphorus  3.9  2.3 - 4.6 mg/dL    Albumin  2.7 (*)  3.5 - 5.2 g/dL    GFR calc non Af Amer  7 (*)  >90 mL/min    GFR calc Af Amer  8 (*)  >90 mL/min    Comment:  (NOTE)     The eGFR has been calculated using the CKD EPI equation.     This calculation has not been validated in all clinical situations.     eGFR's persistently <90 mL/min signify possible Chronic Kidney     Disease.   No results found.    Intake/Output Summary (Last 24 hours) at 10/16/13 0858 Last data filed at 10/16/13 0831  Gross per 24 hour  Intake    240 ml  Output      0 ml  Net    240 ml    Patient Vitals for the past 24 hrs:  BP Temp Temp src Pulse Resp SpO2 Height Weight  10/16/13 0825 158/73 mmHg - - 82 - 100 % - -  10/16/13 0550 163/78 mmHg 99.3 F (37.4 C) Oral 80 16 98 % - -  10/16/13 0500 - - - - - - - 177 lb 0.5 oz (80.3 kg)  10/15/13 1536 147/79 mmHg 97.9 F (36.6 C) Oral 81 15 99 % 5' 6"  (1.676 m) 175 lb 14.8 oz (79.8 kg)     CBG (last 3)   Recent Labs  10/15/13 1613 10/15/13 2033 10/16/13 0727  GLUCAP 162* 146* 154*      Medical  Problem List and Plan:   1. Deconditioning secondary to cardiac arrest in a patient with end-stage renal disease  2.  CAD with h/o recurrent PEA: Has refused further work up. Continue tramadol for chest wall pain due to CPR.  3.  Recurrent GIB: Continue PPI bid and pepcid at HS.  4. ESRD: HD ongoing M, W, F. Able to maintain BP with proamatine prior to procedure and other adjustments to HD process. Check daily weights. Modified diet with FR.  5. L-ICA stenosis: To follow up with Dr. Estanislado Pandy past discharge.  6.  Severe PAD with L-BKA  7. R-IJ thrombus: not a candidate for anticoagulation due to  GIB.  8.  Acute on chronic anemia: Monitor with routine checks and transfuse prn. Continue aranesp.  9. .DM type 2: Will monitor with ac/hs checks. Continue SSI for now. Used amaryl 4 mg at home.

## 2013-10-16 NOTE — Progress Notes (Addendum)
Physical Therapy Assessment and Plan  Patient Details  Name: Briana Gould MRN: [id number] Date of Birth: 08/11/1946  PT Diagnosis: Abnormality of gait, Difficulty walking, Edema of RLE, Deconditioning, and Muscle weakness Rehab Potential: Good ELOS: 12-14   Today's Date: 10/16/2013 Time: 9379-0240 Time Calculation (min): 65 min  Problem List:  Patient Active Problem List   Diagnosis Date Noted  . Physical deconditioning 10/15/2013  . Preop cardiovascular exam 10/11/2013  . CVA (cerebral infarction) 10/01/2013  . Paroxysmal a-fib 10/01/2013  . H/O: GI bleed 09/25/2013  . Cardiac arrest 09/20/2013  . ESRD (end stage renal disease) 09/20/2013  . Acute respiratory failure with hypoxia 09/20/2013  . Hypertension 09/20/2013  . Encephalopathy acute 09/20/2013  . Bradycardia 09/03/2013  . Anemia of chronic renal failure 08/30/2013  . Secondary hyperparathyroidism 08/30/2013  . GIB (gastrointestinal bleeding) 08/29/2013  . CAD (coronary artery disease) 08/16/2013  . History of syncope 08/16/2013  . Aortic stenosis 08/16/2013  . 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 Cerebrovascular disease 08/15/2013  . Thrombocytopenia 08/14/2013  . Seizure 08/13/2013  . PAD (peripheral artery disease) 08/13/2013  . Weakness generalized 11/08/2011  . ESRD (end stage renal disease) on dialysis 11/04/2011  . MITRAL REGURGITATION 10/04/2010  . AORTIC STENOSIS 10/04/2010  . DM 09/13/2010  . HYPERLIPIDEMIA 09/13/2010  . OBESITY 09/13/2010  . HYPERTENSION 09/13/2010  . PULMONARY HYPERTENSION 09/13/2010  . BKA, LEFT LEG 09/13/2010    Past Medical History:  Past Medical History  Diagnosis Date  . PAD (peripheral artery disease)     a. L BKA  . Type II diabetes mellitus   . Anemia   . History of blood transfusion 2002    "related to amputation" (08/13/2013)  . End stage renal disease on dialysis     "[facility]; M, W, F" (08/13/2013)  . DVT (deep venous thrombosis)      a. R IJ DVT 08/2013.  Marland Kitchen CVA (cerebral infarction)     a. MRI 08/2013: possible left motor strip infarct with remote pontine infarct.  . Pulmonary HTN     a. Cath 2011 - mod to severe pulmonary hypertension. b. Echo 08/2013: PAP 26mHg (mild-mod increased).  . Thrombocytopenia   . CAD (coronary artery disease)     a. Severe diffuse diabetic disease by cath 2011, for med rx, not a candidate for CABG or PCI at that time.  . Aortic stenosis   . Syncope     a. 2011, etiology never clear.  . Carotid artery disease     a. Dopp 197/3532 19-92%LICA< 642-68%RICA borderline >80%.  . Cerebrovascular disease     a. 08/2013 MRA brain: multiple bilateral moderate and high grade intracranial stenoses  . Cardiac arrest 08/14/13    a. Possible arrest in setting of hypotension during dialysis with possible arrest requiring brief CPR, not intubated and was fully intact after episode. Suspected due to low cerebral perfusion in setting of hypotension. Unclear if any arrhythmia.  . End stage renal disease on dialysis   . Peripheral arterial occlusive disease   . Hypertension   . Cardiac arrest   . Diabetes mellitus type 2, uncontrolled, with complications   . DVT (deep venous thrombosis)      Right internal jugular vein  . CAD (coronary artery disease), native coronary artery 2011    Diffuse distal vessel CAD noted at cardiac catheterization   Past Surgical History:  Past Surgical History  Procedure Laterality Date  . Av fistula placement Left 2011  upper arm  . Below knee leg amputation Left 2002  . Tonsillectomy    . Cataract extraction w/ intraocular lens  implant, bilateral Bilateral 2012-2014  . Esophagogastroduodenoscopy N/A 08/30/2013    Procedure: ESOPHAGOGASTRODUODENOSCOPY (EGD);  Surgeon: Patrick D Hung, MD;  Location: [facility] ENDOSCOPY;  Service: Endoscopy;  Laterality: N/A;  . Below knee leg amputation Left   . Av fistula placement Left     Assessment & Plan Clinical Impression: Patient is  a 68 y.o. year old female with recent admission to the hospital on 09-20-2013  with PEA arrest and small infarct of the PONS noted on imaging.  Patient transferred to CIR on 10/15/2013 .   Patient currently requires min with mobility secondary to muscle weakness.  Prior to hospitalization, patient was modified independent  with mobility and lived with 3 Grandchildren (ages 8, 10 and 13)  in a House home.  Home access is  Ramped entrance.  Patient will benefit from skilled PT intervention to maximize safe functional mobility for planned discharge home .  Anticipate patient will benefit from follow up HH at discharge. Pt reports her daughter is available to assist but she does work full time. Currently patients brother is in town to assist with care of the 3 grandchildren.   PT - End of Session Activity Tolerance: Tolerates 30+ min activity with multiple rests (Pt fatigues quickly, espeically with standing and gait activities, requiring frequent rest breaks. Vitals WFL) PT Assessment Rehab Potential: Good PT Patient demonstrates impairments in the following area(s): Balance;Edema;Endurance;Safety;Motor PT Transfers Functional Problem(s): Bed Mobility;Bed to Chair;Car PT Locomotion Functional Problem(s): Ambulation;Wheelchair Mobility;Other (comment) (negotiation up/down ramp for safe entry/exit of the home and into the community) PT Plan PT Intensity: Minimum of 1-2 x/day ,45 to 90 minutes PT Frequency: 5 out of 7 days PT Duration Estimated Length of Stay: 12-14 PT Treatment/Interventions: Ambulation/gait training;Community reintegration;Discharge planning;Disease management/prevention;DME/adaptive equipment instruction;Functional mobility training;Neuromuscular re-education;Pain management;Patient/family education;Therapeutic Activities;Therapeutic Exercise;UE/LE Strength taining/ROM;Stair training;UE/LE Coordination activities;Visual/perceptual remediation/compensation;Wheelchair  propulsion/positioning;Psychosocial support PT Transfers Anticipated Outcome(s): mod I PT Locomotion Anticipated Outcome(s): mod I  PT Recommendation Follow Up Recommendations: Home health PT Patient destination: Home Equipment Recommended: Rolling walker with 5" wheels  Skilled Therapeutic Intervention   PT Evaluation Precautions/Restrictions Precautions Precautions: None Other Brace/Splint: BKA- RLE with prosthesis. Pt able to donn herself.  General   Vital SignsTherapy Vitals Pulse Rate: 85 BP: 152/72 mmHg Patient Position, if appropriate: Sitting Oxygen Therapy SpO2: 99 % O2 Device: None (Room air) Pain Pain Assessment Pain Assessment: No/denies pain Pain Score: 0-No pain Patients Stated Pain Goal: 0 Home Living/Prior Functioning Pt lives in a one story home with a ramp to enter.  Vision/Perception  Vision - History Baseline Vision: Wears glasses all the time Visual History: Cataracts;Other (comment) (Left cataract sx. Pt reports blurry vision to in the Left. Pt able to read small print with glass donned. ) Perception Perception: Within Functional Limits  Cognition Overall Cognitive Status: Within Functional Limits for tasks assessed Arousal/Alertness: Awake/alert Orientation Level: Oriented X4 Attention: Focused Memory: Appears intact Problem Solving: Appears intact Safety/Judgment: Appears intact Sensation  Intact to light touch in bilateral lower extremities.  Motor  Motor Motor: Within Functional Limits  Mobility Bed Mobility Bed Mobility: Supine to Sit;Sit to Supine;Rolling Right;Rolling Left;Scooting to HOB Rolling Right: 5: Supervision Rolling Right Details: Verbal cues for technique Rolling Left: 5: Supervision Rolling Left Details: Verbal cues for technique Supine to Sit: 4: Min assist Supine to Sit Details: Tactile cues for sequencing;Verbal cues for technique Sit to   Supine: 3: Mod assist (BLE assist) Sit to Supine - Details: Tactile cues for  sequencing;Verbal cues for sequencing;Verbal cues for technique Scooting to [facility]: 2: Max assist Scooting to [facility] Details: Tactile cues for initiation;Tactile cues for sequencing;Verbal cues for technique;Verbal cues for sequencing Transfers Transfers: Yes Sit to Stand: 4: Min assist Sit to Stand Details: Tactile cues for sequencing;Verbal cues for technique;Verbal cues for sequencing Stand to Sit: 4: Min assist Stand to Sit Details (indicate cue type and reason): Tactile cues for sequencing;Verbal cues for technique;Verbal cues for sequencing Squat Pivot Transfers: 4: Min Risk manager Details: Tactile cues for sequencing;Verbal cues for sequencing;Verbal cues for technique Locomotion  Ambulation Ambulation: Yes Ambulation/Gait Assistance: 4: Min assist Ambulation Distance (Feet):  (5 feet x1 and 8 feet x1 with lengthy rest break in between trial secondary to fatigue. BP: 152/72, HR: 78 BPM, O2: 99% on RA. ) Assistive device: Rolling walker Ambulation/Gait Assistance Details: Verbal cues for technique;Verbal cues for sequencing Stairs / Additional Locomotion Stairs: No (Pt unable to attempt during eval) Wheelchair Mobility Wheelchair Mobility: Yes Wheelchair Assistance: 5: Supervision;4: Min assist (supervision on level surfaces, min A required for turning and negotiating obstacles. ) Wheelchair Assistance Details: Verbal cues for sequencing;Tactile cues for sequencing Wheelchair Propulsion: Both upper extremities;Right lower extremity Wheelchair Parts Management: Supervision/cueing Distance: 150     Balance Balance Balance Assessed: Yes Static Sitting Balance Static Sitting - Balance Support: No upper extremity supported Static Sitting - Level of Assistance: 6: Modified independent (Device/Increase time) Dynamic Sitting Balance Dynamic Sitting - Balance Support: No upper extremity supported Dynamic Sitting - Level of Assistance: 5: Stand by assistance Dynamic Sitting  Balance - Compensations: Pt able to donn and doff LLE prosthesis sitting EOB without assist and without LOB.  Dynamic Sitting - Balance Activities: Trunk control activities;Reaching for objects Static Standing Balance Static Standing - Balance Support: Bilateral upper extremity supported Static Standing - Level of Assistance: 4: Min assist Dynamic Standing Balance Dynamic Standing - Balance Support: Bilateral upper extremity supported Dynamic Standing - Level of Assistance: 4: Min assist  FIM:  FIM - Control and instrumentation engineer Devices: Bed rails;Arm rests Bed/Chair Transfer: 4: Supine > Sit: Min A (steadying Pt. > 75%/lift 1 leg);3: Sit > Supine: Mod A (lifting assist/Pt. 50-74%/lift 2 legs);4: Chair or W/C > Bed: Min A (steadying Pt. > 75%);4: Bed > Chair or W/C: Min A (steadying Pt. > 75%) FIM - Locomotion: Wheelchair Distance: 150 Locomotion: Wheelchair: 4: Travels 150 ft or more: maneuvers on rugs and over door sillls with minimal assistance (Pt.>75%) FIM - Locomotion: Ambulation Locomotion: Ambulation Assistive Devices: Administrator Ambulation/Gait Assistance: 4: Min assist Locomotion: Ambulation: 1: Travels less than 50 ft with minimal assistance (Pt.>75%) (fatigues quickly. Ambulating 5 feet x1 and 8 feet x1) FIM - Locomotion: Stairs Locomotion: Stairs: 0: Activity did not occur   Refer to Care Plan for Long Term Goals  Recommendations for other services: None  Discharge Criteria: Patient will be discharged from PT if patient refuses treatment 3 consecutive times without medical reason, if treatment goals not met, if there is a change in medical status, if patient makes no progress towards goals or if patient is discharged from hospital.  The above assessment, treatment plan, treatment alternatives and goals were discussed and mutually agreed upon: by patient  Lillia Corporal R 10/16/2013, 12:58 PM

## 2013-10-17 ENCOUNTER — Inpatient Hospital Stay (HOSPITAL_COMMUNITY): Payer: Medicare Other | Admitting: Physical Therapy

## 2013-10-17 DIAGNOSIS — E119 Type 2 diabetes mellitus without complications: Secondary | ICD-10-CM

## 2013-10-17 DIAGNOSIS — I469 Cardiac arrest, cause unspecified: Secondary | ICD-10-CM

## 2013-10-17 DIAGNOSIS — I10 Essential (primary) hypertension: Secondary | ICD-10-CM

## 2013-10-17 LAB — GLUCOSE, CAPILLARY
GLUCOSE-CAPILLARY: 137 mg/dL — AB (ref 70–99)
Glucose-Capillary: 209 mg/dL — ABNORMAL HIGH (ref 70–99)
Glucose-Capillary: 142 mg/dL — ABNORMAL HIGH (ref 70–99)
Glucose-Capillary: 154 mg/dL — ABNORMAL HIGH (ref 70–99)

## 2013-10-17 NOTE — IPOC Note (Addendum)
Overall Plan of Care [facility](IPOC) Patient Details Name: Briana Gould MRN: [id number] DOB: November 19, 1945  Admitting Diagnosis: OLD BKA, PEA ARREST, ESRD   Hospital Problems: Active Problems:   Physical deconditioning     Functional Problem List: Nursing Bladder;Bowel;Pain;Safety;Skin Integrity  PT Balance;Edema;Endurance;Safety;Motor  OT Balance;Endurance;Edema  SLP    TR  activity tolerance, balance, safety       Basic ADL's: OT Grooming;Bathing;Dressing;Toileting     Advanced  ADL's: OT Simple Meal Preparation;Light Housekeeping     Transfers: PT Bed Mobility;Bed to Chair;Car  OT Toilet;Tub/Shower     Locomotion: PT Ambulation;Wheelchair Mobility;Other (comment) (negotiation up/down ramp for safe entry/exit of the home and into the community)     Additional Impairments: OT    SLP        TR      Anticipated Outcomes Item Anticipated Outcome  Self Feeding independent  Swallowing      Basic self-care  modified independent  Toileting  modified independent   Bathroom Transfers supervision to modified independent  Bowel/Bladder  Pt. will continue continent to bowel and bladder  Transfers  mod I  Locomotion  mod I   Communication     Cognition     Pain  Pt. pain level 2,on scale 1 to 10  Safety/Judgment  Pt. will remain free from fall during her stay in rehab   Therapy Plan: PT Intensity: Minimum of 1-2 x/day ,45 to 90 minutes PT Frequency: 5 out of 7 days PT Duration Estimated Length of Stay: 12-14 OT Intensity: Minimum of 1-2 x/day, 45 to 90 minutes OT Frequency: Total of 15 hours over 7 days OT Duration/Estimated Length of Stay: 10-12 days         Team Interventions: Nursing Interventions Patient/Family Education;Bowel Management;Pain Management;Medication Management;Discharge Planning;Skin Care/Wound Management  PT interventions Ambulation/gait training;Community reintegration;Discharge planning;Disease management/prevention;DME/adaptive  equipment instruction;Functional mobility training;Neuromuscular re-education;Pain management;Patient/family education;Therapeutic Activities;Therapeutic Exercise;UE/LE Strength taining/ROM;Stair training;UE/LE Coordination activities;Visual/perceptual remediation/compensation;Wheelchair propulsion/positioning;Psychosocial support  OT Interventions Financial controllerBalance/vestibular training;Discharge planning;DME/adaptive equipment instruction;Functional mobility training;Patient/family education;Psychosocial support;Self Care/advanced ADL retraining;Therapeutic Activities;Therapeutic Exercise;UE/LE Strength taining/ROM;UE/LE Coordination activities;Wheelchair propulsion/positioning  SLP Interventions    TR Interventions  therapeutic activities, functional mobility, recreation/leisure participation, psychosocial support, discharge planning, pt/family education, therapeutic activities, balance training, community reintegration  SW/CM Interventions      Team Discharge Planning: Destination: PT-Home ,OT- Home , SLP-  Projected Follow-up: PT-Home health PT, OT-  Home health OT, SLP-  Projected Equipment Needs: PT-Rolling walker with 5" wheels, OT- To be determined, SLP-  Equipment Details: PT- , OT-  Patient/family involved in discharge planning: PT- Patient,  OT-Patient, SLP-   MD ELOS: 10-12 days pending medical plan and stability Medical Rehab Prognosis:  Excellent Assessment: The patient has been admitted for CIR therapies. The team will be addressing, functional mobility, strength, stamina, balance, safety, adaptive techniques/equipment, self-care, bowel and bladder mgt, patient and caregiver education, CV stamina, pain mgt, ego-support. Goals have been set at mod I for mobility and self-care.    Ranelle OysterZachary T. Swartz, MD, FAAPMR      See Team Conference Notes for weekly updates to the plan of care

## 2013-10-17 NOTE — Progress Notes (Signed)
[name], female   DOB: 10/20/1945, 68 y.o.   MRN: [id number]  [name], female   DOB: [birth year], 68 y.o.   MRN: [id number]  10/17/13.  Deconditioning.    HPI: Briana Gould is a 68 y.o. 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 Patient did receive CPR x20 minutes. Cranial CT scan showed age uncertain a small infarct mid pons. Small infarct in this area felt to be possibly recent. Echocardiogram ejection fraction of 55% and no ST segment elevation on EKG. MRA showed significant intracranial atherosclerotic type changes. Carotid Dopplers 60-79% right ICA stenosis. Patient not felt to be a surgical intervention candidate.  Patient was extubated 09/23/2013. 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 60% stenosis of right ICA approximately and 50% stenosis left ICA petrous segment. She was started on antiplatelets in anticipation of stent placement but she developed A Fib with RVR on 10/07/13 but did convert to NSR. She developed hematemesis 10/08/13 due to recurrent GIB and required 2 units PRBC. Cardiac cath recommended by Dr. Johnsie Cancel to rule out development of cardiac disease (last cath 4 years ago/ recurrent cardiac arrests) but patient and family declined. Plans for L-ICA stent/angio in the future when stable. Hypotension controlled with midodrine support. Patient deconditioned and has difficulty with transitional movements. Rehab team recommending CIR and patient admitted yesterday (10/15/13).  Review of Systems  HENT: Negative for hearing loss.   Eyes: Positive for blurred vision.  Respiratory: Negative for cough and shortness of breath.  Cardiovascular: Positive for chest pain. Negative for palpitations.  Gastrointestinal: Negative for heartburn, nausea, vomiting and constipation.  Musculoskeletal: Positive for back pain and neck pain.  Neurological: Negative for dizziness, tingling, focal weakness, seizures and headaches.  Psychiatric/Behavioral: The patient does not have insomnia.  Past Medical History   Diagnosis  Date   .  End stage renal disease on dialysis    .  Peripheral arterial occlusive disease    .  Hypertension    .  Cardiac arrest    .  Diabetes mellitus type 2, uncontrolled, with complications    .  DVT (deep venous thrombosis)      Right internal jugular vein   .  CAD (coronary artery disease), native coronary artery  2011     Diffuse distal vessel CAD noted at cardiac catheterization    Past Surgical History   Procedure  Laterality  Date   .  Below knee leg amputation  Left    .  Av fistula placement  Left     Family History   Problem  Relation  Age of Onset   .  Heart attack  Brother    .  Stroke  Mother    .  Pulmonary embolism  Father       Physical Exam:  Blood pressure 150/65, pulse 76, temperature 97.9 F (36.6 C), temperature source Oral, resp. rate 13, height 5' 6"  (1.676 m), weight 81.1 kg (178 lb 12.7 oz), SpO2 100.00%.  Physical Exam   Gen- alert; no distress HEENT- neg Chest- clear CV- slightly irregular no tachycardia; grade 2/6 systolic murmur Abd- soft and non-tender Extr- s/p L BKA  Results for orders placed during the hospital encounter of 09/20/13 (from the past 48 hour(s))   GLUCOSE, CAPILLARY Status: Abnormal    Collection Time    10/13/13 5:00 PM   Result  Value  Range    Glucose-Capillary  144 (*)  70 - 99 mg/dL    Comment 1  Notify RN     Comment 2  Documented in Chart    GLUCOSE, CAPILLARY Status: Abnormal    Collection Time    10/13/13 7:53 PM   Result  Value   Range    Glucose-Capillary  162 (*)  70 - 99 mg/dL    Comment 1  Notify RN    GLUCOSE, CAPILLARY Status: Abnormal    Collection Time    10/14/13 7:42 AM   Result  Value  Range    Glucose-Capillary  136 (*)  70 - 99 mg/dL   GLUCOSE, CAPILLARY Status: Abnormal    Collection Time    10/14/13 11:50 AM   Result  Value  Range    Glucose-Capillary  207 (*)  70 - 99 mg/dL   GLUCOSE, CAPILLARY Status: Abnormal    Collection Time    10/14/13 4:24 PM   Result  Value  Range    Glucose-Capillary  141 (*)  70 - 99 mg/dL   GLUCOSE, CAPILLARY Status: Abnormal    Collection Time    10/14/13 10:40 PM   Result  Value  Range    Glucose-Capillary  176 (*)  70 - 99 mg/dL    Comment 1  Notify RN    CREATININE, SERUM Status: Abnormal    Collection Time    10/15/13 5:10 AM   Result  Value  Range    Creatinine, Ser  6.03 (*)  0.50 - 1.10 mg/dL    GFR calc non Af Amer  6 (*)  >90 mL/min    GFR calc Af Amer  8 (*)  >90 mL/min    Comment:  (NOTE)     The eGFR has been calculated using the CKD EPI equation.     This calculation has not been validated in all clinical situations.     eGFR's persistently <90 mL/min signify possible Chronic Kidney     Disease.   CBC Status: Abnormal    Collection Time    10/15/13 7:31 AM   Result  Value  Range    WBC  10.8 (*)  4.0 - 10.5 K/uL    RBC  2.93 (*)  3.87 - 5.11 MIL/uL    Hemoglobin  9.1 (*)  12.0 - 15.0 g/dL    HCT  28.9 (*)  36.0 - 46.0 %    MCV  98.6  78.0 - 100.0 fL    MCH  31.1  26.0 - 34.0 pg    MCHC  31.5  30.0 - 36.0 g/dL    RDW  18.4 (*)  11.5 - 15.5 %    Platelets  200  150 - 400 K/uL   RENAL FUNCTION PANEL Status: Abnormal    Collection Time    10/15/13 7:31 AM   Result  Value  Range    Sodium  139  137 - 147 mEq/L    Potassium  4.0  3.7 - 5.3 mEq/L    Chloride  97  96 - 112 mEq/L    CO2  27  19 - 32 mEq/L    Glucose, Bld  198 (*)  70 - 99 mg/dL    BUN  24 (*)  6 - 23 mg/dL    Creatinine, Ser  5.95 (*)  0.50 - 1.10 mg/dL    Calcium   9.9  8.4 - 10.5 mg/dL    Phosphorus  3.9  2.3 - 4.6 mg/dL    Albumin  2.7 (*)  3.5 - 5.2 g/dL    GFR calc non Af Amer  7 (*)  >90 mL/min    GFR calc Af Amer  8 (*)  >90 mL/min    Comment:  (NOTE)     The eGFR has been calculated using the CKD EPI equation.     This calculation has not been validated in all clinical situations.     eGFR's persistently <90 mL/min signify possible Chronic Kidney     Disease.   No results found.    Intake/Output Summary (Last 24 hours) at 10/17/13 0805 Last data filed at 10/16/13 1841  Gross per 24 hour  Intake    960 ml  Output      0 ml  Net    960 ml    Patient Vitals for the past 24 hrs:  BP Temp Temp src Pulse Resp SpO2  10/17/13 0550 148/97 mmHg 98.6 F (37 C) Oral 75 18 99 %  10/16/13 1624 156/81 mmHg 98.2 F (36.8 C) Oral 80 18 98 %  10/16/13 0935 152/72 mmHg - - 85 - 99 %  10/16/13 0825 158/73 mmHg - - 82 - 100 %     CBG (last 3)   Recent Labs  10/16/13 1600 10/16/13 2037 10/17/13 0717  GLUCAP 141* 156* 137*      Medical Problem List and Plan:   1. Deconditioning secondary to cardiac arrest in a patient with end-stage renal disease  2.  CAD with h/o recurrent PEA: Has refused further work up. Continue tramadol for chest wall pain due to CPR.  3.  Recurrent GIB: Continue PPI bid and pepcid at HS.  4. ESRD: HD ongoing M, W, F. Able to maintain BP with proamatine prior to procedure and other adjustments to HD process. Check daily weights. Modified diet with FR.  5. L-ICA stenosis: To follow up with Dr. Estanislado Pandy past discharge.  6.  Severe PAD with L-BKA  7. R-IJ thrombus: not a candidate for anticoagulation due to GIB.  8.  Acute on chronic anemia: Monitor with routine checks and transfuse prn. Continue aranesp.  9. .DM type 2: Will monitor with ac/hs checks. Continue SSI for now. Used amaryl 4 mg at home.

## 2013-10-17 NOTE — Progress Notes (Signed)
Physical Therapy Session Note  Patient Details  Name: Briana Gould MRN: [id number] Date of Birth: [birth year]  Today's Date: 10/17/2013 Time: 0800-0858 Time Calculation (min): 58 min  Short Term Goals: Week 1:  PT Short Term Goal 1 (Week 1): Pt will be able to propel wheel chair 200 feet at mod I on level surfaces to promote community re-entry, allowing pt to attend follow up MD appointments.  PT Short Term Goal 2 (Week 1): Pt will be able to transfer from various surfaces with supervision to promote OOB activity and increase functional mobility.  PT Short Term Goal 3 (Week 1): Pt will be able to ambulate 50 feet using the LRAD in order to promote functional mobility within the home, allowing pt to ambulate to the bathroom as needed for toileting.  PT Short Term Goal 4 (Week 1): Pt will be able to tolerate 10-15 reps of strenth training exercises of the lower extremities to promote increased strength, ROM and overall improved functional mobility.   Skilled Therapeutic Interventions/Progress Updates:  Pt was seen bedside in the am, sitting on edge of bed. Pt able to don R prosthesis and shirt with S. Pt transferred sit to stand with min guard and rolling walker to pull up pants once donned. Pt ambulated from edge of bed about 7 feet to w/c with rolling walker and min guard. Pt propelled w/c about 100 feet with B UEs and S with verbal cues. Once in gym peformed LE exercises and multiple sit to stand transfers for LE strengthening. Pt ambulated with rolling walker and min guard x 2 for 20 feet each time with occasional verbal cues for safety.   Therapy Documentation Precautions:  Precautions Precautions: None Required Braces or Orthoses: Other Brace/Splint Other Brace/Splint: BKA- RLE with prosthesis. Pt able to donn herself.  Restrictions Weight Bearing Restrictions: No General:   Pain: No c/o pain.   Locomotion : Ambulation Ambulation/Gait Assistance: 4: Min guard   See FIM for  current functional status  Therapy/Group: Individual Therapy  Rayford HalstedMitchell, Briana Gould 10/17/2013, 12:29 PM

## 2013-10-17 NOTE — Progress Notes (Signed)
[facility] ROUNDING NOTE   Subjective:   Interval History: no complaints sitting out in wheelchair no shortness of breath or chest pain  Objective:  Vital signs in last 24 hours:  Temp:  [98.2 F (36.8 C)-98.6 F (37 C)] 98.6 F (37 C) (02/08 0550) Pulse Rate:  [75-80] 75 (02/08 0550) Resp:  [18] 18 (02/08 0550) BP: (148-156)/(81-97) 148/97 mmHg (02/08 0550) SpO2:  [98 %-99 %] 99 % (02/08 0550)  Weight change:  Filed Weights   10/15/13 1536 10/16/13 0500  Weight: 79.8 kg (175 lb 14.8 oz) 80.3 kg (177 lb 0.5 oz)    Intake/Output: I/O last 3 completed shifts: In: 960 [P.O.:960] Out: -    Intake/Output this shift:     CVS- RRR RS- CTA ABD- BS present soft non-distended EXT- no edema   Basic Metabolic Panel:  Recent Labs Lab 10/11/13 1243 10/13/13 0835 10/15/13 0510 10/15/13 0731  NA 136* 139  --  139  K 5.3 4.1  --  4.0  CL 94* 97  --  97  CO2 25 25  --  27  GLUCOSE 160* 180*  --  198*  BUN 45* 25*  --  24*  CREATININE 6.24* 5.61* 6.03* 5.95*  CALCIUM 9.7 9.8  --  9.9  PHOS 3.8 4.1  --  3.9    Liver Function Tests:  Recent Labs Lab 10/11/13 1243 10/13/13 0835 10/15/13 0731  ALBUMIN 3.0* 3.1* 2.7*   No results found for this basename: LIPASE, AMYLASE,  in the last 168 hours No results found for this basename: AMMONIA,  in the last 168 hours  CBC:  Recent Labs Lab 10/11/13 1243 10/13/13 0834 10/15/13 0731  WBC 11.4* 10.0 10.8*  HGB 8.9* 9.2* 9.1*  HCT 27.5* 28.4* 28.9*  MCV 95.8 97.3 98.6  PLT 230 213 200    Cardiac Enzymes: No results found for this basename: CKTOTAL, CKMB, CKMBINDEX, TROPONINI,  in the last 168 hours  BNP: No components found with this basename: POCBNP,   CBG:  Recent Labs Lab 10/16/13 0727 10/16/13 1127 10/16/13 1600 10/16/13 2037 10/17/13 0717  GLUCAP 154* 160* 141* 156* 137*    Microbiology: Results for orders placed during the hospital encounter of 09/20/13  MRSA PCR SCREENING      Status: None   Collection Time    09/20/13  1:06 PM      Result Value Range Status   MRSA by PCR NEGATIVE  NEGATIVE Final   Comment:            The GeneXpert MRSA Assay (FDA     approved for NASAL specimens     only), is one component of a     comprehensive MRSA colonization     surveillance program. It is not     intended to diagnose MRSA     infection nor to guide or     monitor treatment for     MRSA infections.    Coagulation Studies: No results found for this basename: LABPROT, INR,  in the last 72 hours  Urinalysis: No results found for this basename: COLORURINE, APPERANCEUR, LABSPEC, PHURINE, GLUCOSEU, HGBUR, BILIRUBINUR, KETONESUR, PROTEINUR, UROBILINOGEN, NITRITE, LEUKOCYTESUR,  in the last 72 hours    Imaging: No results found.   Medications:   . sodium chloride     . [START ON 10/20/2013] darbepoetin (ARANESP) injection - DIALYSIS  200 mcg Intravenous Q Wed-HD  . docusate sodium  100 mg Oral BID  . [START ON 10/18/2013] doxercalciferol  8 mcg Intravenous Q M,Gould,F-HD  . famotidine  20 mg Oral QHS  . feeding supplement (NEPRO CARB STEADY)  237 mL Oral QPC supper  . insulin aspart  0-9 Units Subcutaneous TID WC  . [START ON 10/18/2013] midodrine  5 mg Oral Q M,Gould,F-HD  . multivitamin  1 tablet Oral QHS  . sevelamer carbonate  1,600 mg Oral TID WC  . sodium chloride  3 mL Intravenous Q12H   sodium chloride, acetaminophen, aluminum hydroxide, diphenhydrAMINE, guaiFENesin-dextromethorphan, HYDROmorphone (DILAUDID) injection, prochlorperazine, prochlorperazine, prochlorperazine, RESOURCE THICKENUP CLEAR, sodium chloride, sorbitol, traMADol, traZODone  Assessment/ Plan:  68 y/o female with PMH of DM type 2, CAD, severe cerebrovascular disease, right IJ thrombus PAD with b/l BKA, severe carotid disease, Pulm HTN, ESRD on HD and cardiac arrest while on HD who was discharged on 12/21 after being [facility] at [facility] for an upper GI bleed in setting of Coumadin use (IJ  thrombus). Readmitted for PEA arrest while on dialysis. Patient cardiac arrest etiology is not fully determined, question carotid stenosis, severe pulm HTN or severe CAD may be contributing when she is becoming hypotensivie during dialysis sessions, cardiac arrytmia. Plan is for intracranial angiogram and or possible stent 1-29 to see if that help with circulation. Patient remains high risk but is doing well with inpatient treatments  ESRD Dialysis MWF Unfortunately several cardiac arrests with Hemodialysis now exploring ideas such as Next stage . Complicated patient that is a very high surgical risk  HTN controlled Midodrine support  Anemia darbepoietin 100mcg transfused  Bones Hectorol 8mcg and renvela  Carotid disease anticoagulation held for GI bleed. Family and Dr Bedelia Personevenshwar leaning toward medical management  GI bleed PPI x next few days and then retrial anticoagulation Discussed with daughter Briana Gould , she would like more information about home therapy.Discussed with Dr Lowell GuitarPowell and [name] clinic manager , Patient weak and quite high risk of further arrest. Recommended in patient treatments for this week with supervision    LOS: 2 Briana Gould @TODAY @11 :04 AM

## 2013-10-18 ENCOUNTER — Inpatient Hospital Stay (HOSPITAL_COMMUNITY): Payer: Self-pay | Admitting: Physical Therapy

## 2013-10-18 ENCOUNTER — Inpatient Hospital Stay (HOSPITAL_COMMUNITY): Payer: Self-pay

## 2013-10-18 ENCOUNTER — Inpatient Hospital Stay (HOSPITAL_COMMUNITY): Payer: Medicare Other

## 2013-10-18 DIAGNOSIS — N186 End stage renal disease: Secondary | ICD-10-CM

## 2013-10-18 DIAGNOSIS — Z8673 Personal history of transient ischemic attack (TIA), and cerebral infarction without residual deficits: Secondary | ICD-10-CM

## 2013-10-18 DIAGNOSIS — R5381 Other malaise: Secondary | ICD-10-CM

## 2013-10-18 DIAGNOSIS — E1129 Type 2 diabetes mellitus with other diabetic kidney complication: Secondary | ICD-10-CM

## 2013-10-18 DIAGNOSIS — E1165 Type 2 diabetes mellitus with hyperglycemia: Secondary | ICD-10-CM

## 2013-10-18 DIAGNOSIS — I2510 Atherosclerotic heart disease of native coronary artery without angina pectoris: Secondary | ICD-10-CM

## 2013-10-18 LAB — RENAL FUNCTION PANEL
Albumin: 2.7 g/dL — ABNORMAL LOW (ref 3.5–5.2)
BUN: 48 mg/dL — AB (ref 6–23)
CHLORIDE: 92 meq/L — AB (ref 96–112)
CO2: 25 mEq/L (ref 19–32)
Calcium: 10.5 mg/dL (ref 8.4–10.5)
Creatinine, Ser: 8.3 mg/dL — ABNORMAL HIGH (ref 0.50–1.10)
GFR calc non Af Amer: 4 mL/min — ABNORMAL LOW (ref >90)
GFR, EST AFRICAN AMERICAN: 5 mL/min — AB (ref >90)
GLUCOSE: 137 mg/dL — AB (ref 70–99)
POTASSIUM: 4.6 meq/L (ref 3.7–5.3)
Phosphorus: 4 mg/dL (ref 2.3–4.6)
SODIUM: 134 meq/L — AB (ref 137–147)

## 2013-10-18 LAB — CBC
HCT: 29.6 % — ABNORMAL LOW (ref 36.0–46.0)
HEMOGLOBIN: 9.7 g/dL — AB (ref 12.0–15.0)
MCH: 32.1 pg (ref 26.0–34.0)
MCHC: 32.8 g/dL (ref 30.0–36.0)
MCV: 98 fL (ref 78.0–100.0)
Platelets: 228 10*3/uL (ref 150–400)
RBC: 3.02 MIL/uL — AB (ref 3.87–5.11)
RDW: 18.4 % — ABNORMAL HIGH (ref 11.5–15.5)
WBC: 9 10*3/uL (ref 4.0–10.5)

## 2013-10-18 LAB — GLUCOSE, CAPILLARY
GLUCOSE-CAPILLARY: 177 mg/dL — AB (ref 70–99)
GLUCOSE-CAPILLARY: 106 mg/dL — AB (ref 70–99)
Glucose-Capillary: 140 mg/dL — ABNORMAL HIGH (ref 70–99)

## 2013-10-18 MED ORDER — ALTEPLASE 2 MG IJ SOLR: 2.0000 mg | Freq: Once | INTRAMUSCULAR | Status: AC | PRN

## 2013-10-18 MED ORDER — NEPRO/CARBSTEADY PO LIQD: 237.0000 mL | ORAL | Status: DC | PRN

## 2013-10-18 MED ORDER — GLIMEPIRIDE 1 MG PO TABS
1.0000 mg | ORAL_TABLET | Freq: Every day | ORAL | Status: DC
  Administered 2013-10-18 – 2013-10-25 (×7): 1 mg via ORAL
  Filled 2013-10-18 (×9): qty 1

## 2013-10-18 MED ORDER — LIDOCAINE HCL (PF) 1 % IJ SOLN: 5.0000 mL | INTRAMUSCULAR | Status: DC | PRN

## 2013-10-18 MED ORDER — SODIUM CHLORIDE 0.9 % IV SOLN: 100.0000 mL | INTRAVENOUS | Status: DC | PRN

## 2013-10-18 MED ORDER — HEPARIN SODIUM (PORCINE) 1000 UNIT/ML DIALYSIS: 1000.0000 [IU] | INTRAMUSCULAR | Status: DC | PRN

## 2013-10-18 MED ORDER — PENTAFLUOROPROP-TETRAFLUOROETH EX AERO: 1.0000 "application " | INHALATION_SPRAY | CUTANEOUS | Status: DC | PRN

## 2013-10-18 MED ORDER — DOXERCALCIFEROL 4 MCG/2ML IV SOLN
INTRAVENOUS | Status: AC
  Administered 2013-10-18: 8 ug via INTRAVENOUS
  Filled 2013-10-18: qty 4

## 2013-10-18 MED ORDER — LIDOCAINE-PRILOCAINE 2.5-2.5 % EX CREA: 1.0000 "application " | TOPICAL_CREAM | CUTANEOUS | Status: DC | PRN

## 2013-10-18 NOTE — Progress Notes (Signed)
  [facility] Progress Note   Subjective: No complaints  Exam  Blood pressure 149/68, pulse 79, temperature 98.3 F (36.8 C), temperature source Oral, resp. rate 19, height 5\' 6"  (1.676 m), weight 82.3 kg (181 lb 7 oz), SpO2 100.00%.   Dialysis: MWF [place] 4.5h  EDW was 79kg, now around 80-82kg   Prog 2  No heparin Hect 8ug  EPO 20k   Assessment: 1 Recurrent cardiac arrest (PEA)- pt refused further w/u (heart cath) 2 Severe L carotid stenosis- family and Dr Corliss Skainseveshwar leaning towards medical management 3 Recurrent GIB- on bid PPI and pepcid at night 4 ESRD- keep BP > 125, midodrine pre HD, family considering home HD but no change in HD schedule for now 5 PAD L BKA 6 Anemia darbe 200ug, transfused 7 2HPTH Hectorol 8ug and renvela   Plan- HD today    Vinson Moselleob Jazmene Racz MD  pager [phone]    cell [phone]  10/18/2013, 11:07 AM     Recent Labs Lab 10/11/13 1243 10/13/13 0835 10/15/13 0510 10/15/13 0731  NA 136* 139  --  139  K 5.3 4.1  --  4.0  CL 94* 97  --  97  CO2 25 25  --  27  GLUCOSE 160* 180*  --  198*  BUN 45* 25*  --  24*  CREATININE 6.24* 5.61* 6.03* 5.95*  CALCIUM 9.7 9.8  --  9.9  PHOS 3.8 4.1  --  3.9    Recent Labs Lab 10/11/13 1243 10/13/13 0835 10/15/13 0731  ALBUMIN 3.0* 3.1* 2.7*    Recent Labs Lab 10/11/13 1243 10/13/13 0834 10/15/13 0731  WBC 11.4* 10.0 10.8*  HGB 8.9* 9.2* 9.1*  HCT 27.5* 28.4* 28.9*  MCV 95.8 97.3 98.6  PLT 230 213 200   . [START ON 10/20/2013] darbepoetin (ARANESP) injection - DIALYSIS  200 mcg Intravenous Q Wed-HD  . docusate sodium  100 mg Oral BID  . doxercalciferol  8 mcg Intravenous Q M,W,F-HD  . famotidine  20 mg Oral QHS  . feeding supplement (NEPRO CARB STEADY)  237 mL Oral QPC supper  . glimepiride  1 mg Oral Q breakfast  . insulin aspart  0-9 Units Subcutaneous TID WC  . midodrine  5 mg Oral Q M,W,F-HD  . multivitamin  1 tablet Oral QHS  . sevelamer carbonate  1,600 mg Oral TID WC   . sodium chloride  3 mL Intravenous Q12H   . sodium chloride     sodium chloride, acetaminophen, aluminum hydroxide, diphenhydrAMINE, guaiFENesin-dextromethorphan, HYDROmorphone (DILAUDID) injection, prochlorperazine, prochlorperazine, prochlorperazine, RESOURCE THICKENUP CLEAR, sodium chloride, sorbitol, traMADol, traZODone

## 2013-10-18 NOTE — Progress Notes (Signed)
[facility]patient [facility] Individual Statement of Services  Patient Name:  Briana Gould  Date:  10/18/2013  Welcome to the Inpatient Rehabilitation Center.  Our goal is to provide you with an individualized program based on your diagnosis and situation, designed to meet your specific needs.  With this comprehensive rehabilitation program, you will be expected to participate in at least 3 hours of rehabilitation therapies Monday-Friday, with modified therapy programming on the weekends.  Your rehabilitation program will include the following services:  Physical Therapy (PT), Occupational Therapy (OT), 24 hour per day rehabilitation nursing, Case Management (Social Worker), Rehabilitation Medicine, Nutrition Services and Pharmacy Services  Weekly team conferences will be held on Tuesdays to discuss your progress.  Your Social Worker will talk with you frequently to get your input and to update you on team discussions.  Team conferences with you and your family in attendance may also be held.  Expected length of stay: 7-9 days  Overall anticipated outcome: modified independent  Depending on your progress and recovery, your program may change. Your Social Worker will coordinate services and will keep you informed of any changes. Your Social Worker's name and contact numbers are listed  below.  The following services may also be recommended but are not provided by the Inpatient Rehabilitation Center:   Driving Evaluations  Home Health Rehabiltiation Services  Outpatient Rehabilitation Services  Transportation Services   Arrangements will be made to provide these services after discharge if needed.  Arrangements include referral to agencies that provide these services.  Your insurance has been verified to be:  Medicare, Tricare and BCBS Your primary doctor is:  Dr. Margaretmary BayleyPreston Clark  Pertinent information will be shared with your doctor and your insurance company.  Social Worker:   HunnewellLucy Andres Bantz, [place] [phone] or [phone]) [phone]   Information discussed with and copy given to patient by: Amada JupiterHOYLE, Taitum Alms, 10/18/2013, 1:56 PM

## 2013-10-18 NOTE — Progress Notes (Signed)
Patient information reviewed and entered into eRehab system by Riata Ikeda, RN, CRRN, PPS Coordinator.  Information including medical coding and functional independence measure will be reviewed and updated through discharge.     Per nursing patient was given "Data Collection Information Summary for Patients in Inpatient Rehabilitation Facilities with attached "Privacy Act Statement-Health Care Records" upon admission.  

## 2013-10-18 NOTE — Procedures (Signed)
I was present at this dialysis session, have reviewed the session itself and made  appropriate changes  Vinson Moselleob Zharia Conrow MD (pgr) [phone]    [phone]) [phone] 10/18/2013, 6:01 PM

## 2013-10-18 NOTE — Progress Notes (Addendum)
Physical Therapy Session Note  Patient Details  Name: Briana Gould MRN: [id number] Date of Birth: 09-21-1945  Today's Date: 10/18/2013 Time: 1135-1205 Time Calculation (min): 30 min  Skilled Therapeutic Interventions/Progress Updates:    Session focused on sit <> stands and ambulation. Sit <> stands x 4 reps with mod assist progressing to min-guard assist with repetition, PT educated pt on LE positioning, scooting to edge of chair, and anterior weight shift into standing. Ambulation x 30', 40', and 5' with RW and min assist, pt most limited by decreased endurance and needs to sit quickly. Discussed pressure relief, pt practiced wheelchair pushups x 5 reps, PT advised pt to perform every 30 min when up in chair. Rt. Foot elevated at end of session due to increased swelling into foot.   Therapy Documentation Precautions:  Precautions Precautions: None Required Braces or Orthoses: Other Brace/Splint Other Brace/Splint: BKA- RLE with prosthesis. Pt able to donn herself.  Restrictions Weight Bearing Restrictions: No Pain: Pain Assessment Pain Assessment: No/denies pain  See FIM for current functional status  Therapy/Group: Individual Therapy  [name], [name] 10/18/2013, 12:13 PM

## 2013-10-18 NOTE — Progress Notes (Signed)
Social Work  Social Work Assessment and Plan  Patient Details  Name: Geneve Swanstonvaldes MRN: [id number] Date of Birth: [birth year]  Today's Date: 10/18/2013  Problem List:  Patient Active Problem List   Diagnosis Date Noted  . Physical deconditioning 10/15/2013  . Preop cardiovascular exam 10/11/2013  . CVA (cerebral infarction) 10/01/2013  . Paroxysmal a-fib 10/01/2013  . H/O: GI bleed 09/25/2013  . Cardiac arrest 09/20/2013  . ESRD (end stage renal disease) 09/20/2013  . Acute respiratory failure with hypoxia 09/20/2013  . Hypertension 09/20/2013  . Encephalopathy acute 09/20/2013  . Bradycardia 09/03/2013  . Anemia of chronic renal failure 08/30/2013  . Secondary hyperparathyroidism 08/30/2013  . GIB (gastrointestinal bleeding) 08/29/2013  . CAD (coronary artery disease) 08/16/2013  . History of syncope 08/16/2013  . Aortic stenosis 08/16/2013  . 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 Cerebrovascular disease 08/15/2013  . Thrombocytopenia 08/14/2013  . Seizure 08/13/2013  . PAD (peripheral artery disease) 08/13/2013  . Weakness generalized 11/08/2011  . ESRD (end stage renal disease) on dialysis 11/04/2011  . MITRAL REGURGITATION 10/04/2010  . AORTIC STENOSIS 10/04/2010  . DM 09/13/2010  . HYPERLIPIDEMIA 09/13/2010  . OBESITY 09/13/2010  . HYPERTENSION 09/13/2010  . PULMONARY HYPERTENSION 09/13/2010  . BKA, LEFT LEG 09/13/2010   Past Medical History:  Past Medical History  Diagnosis Date  . PAD (peripheral artery disease)     a. L BKA  . Type II diabetes mellitus   . Anemia   . History of blood transfusion 2002    "related to amputation" (08/13/2013)  . End stage renal disease on dialysis     "[facility]; M, W, F" (08/13/2013)  . DVT (deep venous thrombosis)     a. R IJ DVT 08/2013.  Marland Kitchen CVA (cerebral infarction)     a. MRI 08/2013: possible left motor strip infarct with remote pontine infarct.  . Pulmonary HTN     a. Cath 2011 - mod to severe  pulmonary hypertension. b. Echo 08/2013: PAP (mild-mod increased).  . Thrombocytopenia   . CAD (coronary artery disease)     a. Severe diffuse diabetic disease by cath 2011, for med rx, not a candidate for CABG or PCI at that time.  . Aortic stenosis   . Syncope     a. 2011, etiology never clear.  . Carotid artery disease     a. Dopp 40/9811: 1-39% LICA< 60-79% RICA borderline >80%.  . Cerebrovascular disease     a. 08/2013 MRA brain: multiple bilateral moderate and high grade intracranial stenoses  . Cardiac arrest 08/14/13    a. Possible arrest in setting of hypotension during dialysis with possible arrest requiring brief CPR, not intubated and was fully intact after episode. Suspected due to low cerebral perfusion in setting of hypotension. Unclear if any arrhythmia.  . End stage renal disease on dialysis   . Peripheral arterial occlusive disease   . Hypertension   . Cardiac arrest   . Diabetes mellitus type 2, uncontrolled, with complications   . DVT (deep venous thrombosis)      Right internal jugular vein  . CAD (coronary artery disease), native coronary artery 2011    Diffuse distal vessel CAD noted at cardiac catheterization   Past Surgical History:  Past Surgical History  Procedure Laterality Date  . Av fistula placement Left 2011    upper arm  . Below knee leg amputation Left 2002  . Tonsillectomy    . Cataract extraction w/ intraocular lens  implant, bilateral  Bilateral 2012-2014  . Esophagogastroduodenoscopy N/A 08/30/2013    Procedure: ESOPHAGOGASTRODUODENOSCOPY (EGD);  Surgeon: Theda BelfastPatrick D Hung, MD;  [facility];  Service: Endoscopy;  Laterality: N/A;  . Below knee leg amputation Left   . Av fistula placement Left    Social History:  reports that she has never smoked. She does not have any smokeless tobacco history on file. She reports that she does not drink alcohol or use illicit drugs.  Family / Support Systems Marital Status: Married How Long?:  44 yrs Patient Roles: Scientist, product/process developmentpouse;Parent;Caregiver;Other (Comment) (custody of her 3 grandchildren) Spouse/Significant Other: husband, Garrison Columbusaul Taul @ [phone](H) [phone] Children: daughter, Linward NatalDana Hanks Brooks @ [phone](H) [phone] or [phone](C) [phone] Other Supports: brother (here from [place]), April MansonMichael Swanston Anticipated Caregiver: Annabelle Harmanana prn, spouse no assist, pt's brother down to help from [place] Ability/Limitations of Caregiver: supervision but not 24/7 unless brother stays in town;  pt reports brother plans to leave "...five minutes after I get home" Caregiver Availability: Other (Comment) Family Dynamics: pt describes good support from husband and daughter, however, they cannot provide 24/7 care   Social History Preferred language: English Religion: Episcopalian Cultural Background: NA Education: college Read: Yes Write: Yes Employment Status: Retired Date 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  Denies any emotional distress.  Eager to d/c home ASAP.  Denies any concerns about managing at home after d/c Recent Psychosocial Issues: None Pyschiatric History: None Substance Abuse History: None  Patient / Family Perceptions, Expectations & Goals Pt/Family understanding of illness & functional limitations: pt with good understanding of her medical issues which caused a readmission to hospital (had recently d/c'd after GIB).  Good understanding of her deconditioned state and need for CIR. Premorbid pt/family roles/activities: Pt independent overall and was actually driving herself to HD Anticipated changes in  roles/activities/participation: little change anticipated if she reaches targeted goals of mod i except may possibly need assist with transportations Pt/family expectations/goals: "I just want to get home"  [employer] Community Agencies: Other (Comment) ([place] @ [facility] [place]) Premorbid Home Care/DME Agencies: Other (Comment) (OPPT @ [facility]) Transportation available at discharge: family vs possible referral to [facility] Resource referrals recommended: Support group (specify)  Discharge Planning Living Arrangements: Spouse/significant other;Other relatives;Other (Comment) (pt has custody of 8, 6410 and 68 yo grandchildren) Support Systems: Spouse/significant other;Children Type of Residence: Private residence Community education officernsurance Resources: AdministratorMedicare;Private Insurance (specify) Biomedical engineer(Tricare and AcupuncturistBCBS) Financial Resources: Restaurant manager, fast foodocial Security Financial Screen Referred: No Living Expenses: Own Money Management: Spouse Does the patient have any problems obtaining your medications?: No Home Management: pt and family Patient/Family Preliminary Plans: pt to return home with husband and three grandchildren Social Work Anticipated Follow Up Needs: HH/OP Expected length of stay: ELOS 7 to 9 days  Clinical Impression Pleasant woman here following cardiac arrest at HD center and now deconditioned.  Familiar with CIR (was here following BKA).  Very eager to return home as soon as possible.  No emotional distress noted/ reports.  Will follow for d/c planning and support needs.  Lenvil Swaim 10/18/2013, 1:54 PM

## 2013-10-18 NOTE — Plan of Care (Signed)
Problem: RH BOWEL ELIMINATION Goal: RH STG MANAGE BOWEL WITH ASSISTANCE STG Manage Bowel with Assistance.  Outcome: Progressing Pt unable to have BM for 5 days, requiring PRN sorbitol as well as scheduled laxatives

## 2013-10-18 NOTE — Progress Notes (Signed)
Occupational Therapy Session Note  Patient Details  Name: Briana Gould MRN: [id number] Date of Birth: 02-06-1946  Today's Date: 10/18/2013 Time: [id number] Time Calculation (min): 64 min  Short Term Goals: Week 1:  OT Short Term Goal 1 (Week 1): Patient will complete grooming wheelchair level at the sink independently. OT Short Term Goal 2 (Week 1): Patient will transfer wheelchair to/from toilet with use of grab bars with supervision. OT Short Term Goal 3 (Week 1): Patient will transfer wheelchair to/from shower with use of grab bars with supervision. OT Short Term Goal 4 (Week 1): Patient will complete toileting with minimum assistance stand balance for clothing management up and down. OT Short Term Goal 5 (Week 1): Patient will complete LB dressing with minimum assistance stand balance.  Skilled Therapeutic Interventions/Progress Updates: ADL-retraining with focus on improved activity tolerance, standing balance, transfers.   Patient received at edge of bed, visiting with her sister while awaiting therapist.   Patient donned prosthesis and completed transfer from edge of bed to w/c with steadying assist.   Patient completed transfer from w/c to tub bench with mod assist (lowering) but required max assist after prolonged bath.   Patient dressed at edge of bed with min assist to snap bra and max assist to pull up underwear, lace pants up both legs and pull up pants.   Patient required extra time to progress through ADL but maintained pace throughout activity without need for rest break.    Therapy Documentation Precautions:  Precautions Precautions: None Required Braces or Orthoses: Other Brace/Splint Other Brace/Splint: BKA- RLE with prosthesis. Pt able to donn herself.  Restrictions Weight Bearing Restrictions: No  Pain: Pain Assessment Pain Assessment: No/denies pain  See FIM for current functional status  Therapy/Group: Individual Therapy  Rudene Poulsen 10/18/2013, 10:35  AM

## 2013-10-18 NOTE — Progress Notes (Signed)
Occupational Therapy Note  Patient Details  Name: Briana Gould MRN: [id number] Date of Birth: October 12, 1945 Today's Date: 10/18/2013  Time: 9562-13081035-1115 Pt denies pain Individual Therapy   Pt engaged in dynamic standing activities folding towels, reaching for objects outside BOS, and other BUE tasks.  Pt required rest breaks X 5 during session.  Focus on activity tolerance, dynamic standing balance, and safety awareness.  Lavone NeriLanier, Joneric Streight [facility] 10/18/2013, 2:44 PM

## 2013-10-18 NOTE — Progress Notes (Signed)
Physical Therapy Session Note  Patient Details  Name: Briana Gould MRN: [id number] Date of Birth: 05-Sep-1946  Today's Date: 10/18/2013 Time: [id number] Time Calculation (min): 55 min  Skilled Therapeutic Interventions/Progress Updates:    W/c propulsion for strengthening and endurance x 100' with supervision. Sit <> stands for neuro re-education and strengthening 2 x 5 reps with focus on weight shift and slow eccentric control. Standing balance working on decreased UE support and reaching behind and overhead for dynamic balance. Ambulation x 25', 30' with RW and min-guard assist fatigue still limiting factor. Elevated Rt LE, RN made aware of end of therapies and pt ready for HD.   Extended rest breaks needed.  Therapy Documentation Precautions:  Precautions Precautions: None Required Braces or Orthoses: Other Brace/Splint Other Brace/Splint: BKA- RLE with prosthesis. Pt able to donn herself.  Restrictions Weight Bearing Restrictions: No Pain:  no c/o  See FIM for current functional status  Therapy/Group: Individual Therapy  Briana Gould, Briana Gould 10/18/2013, 3:18 PM

## 2013-10-18 NOTE — Progress Notes (Signed)
Subjective/Complaints:   Objective: Vital Signs: Blood pressure 149/68, pulse 79, temperature 98.3 F (36.8 C), temperature source Oral, resp. rate 19, height 5\' 6"  (1.676 m), weight 82.3 kg (181 lb 7 oz), SpO2 100.00%. No results found. No results found for this basename: WBC, HGB, HCT, PLT,  in the last 72 hours No results found for this basename: NA, K, CL, CO, GLUCOSE, BUN, CREATININE, CALCIUM,  in the last 72 hours CBG (last 3)   Recent Labs  10/17/13 1559 10/17/13 2127 10/18/13 0730  GLUCAP 142* 154* 140*    Wt Readings from Last 3 Encounters:  10/18/13 82.3 kg (181 lb 7 oz)  10/15/13 81.1 kg (178 lb 12.7 oz)  10/15/13 81.1 kg (178 lb 12.7 oz)    Physical Exam:   General: Alert and oriented x 3, No apparent distress. obese HEENT: Head is normocephalic, atraumatic, PERRLA, EOMI, sclera anicteric, oral mucosa pink and moist, dentition intact, ext ear canals clear,  Neck: Supple without JVD or lymphadenopathy Heart: Reg rate and rhythm. With SEM. no rubs or gallops Chest: CTA bilaterally without wheezes, rales, or rhonchi; no distress Abdomen: Soft, non-tender, non-distended, bowel sounds positive. Extremities: No clubbing, cyanosis, or trace LE edema. Pulses are 2+ Skin: Clean and intact without signs of breakdown. Left BK well healed. Neuro: Pt is cognitively appropriate with normal insight, memory, and awareness. Cranial nerves 2-12 are intact. Sensory exam is normal except for decreased PP RLE distally. Reflexes are 2+ in all 4's. Fine motor coordination is intact. No tremors. Motor function is grossly 5/5.  Musculoskeletal: Full ROM, No pain with AROM or PROM in the neck, trunk, or extremities. Posture appropriate Psych: Pt's affect is appropriate. Pt is cooperative    Assessment/Plan: 1. Functional deficits secondary to deconditioning which require 3+ hours per day of interdisciplinary therapy in a comprehensive inpatient rehab setting. Physiatrist is  providing close team supervision and 24 hour management of active medical problems listed below. Physiatrist and rehab team continue to assess barriers to discharge/monitor patient progress toward functional and medical goals. FIM: FIM - Bathing Bathing Steps Patient Completed: Chest;Right Arm;Left Arm;Abdomen;Front perineal area;Buttocks;Right upper leg;Left upper leg;Right lower leg (including foot);Left lower leg (including foot) Bathing: 5: Supervision: Safety issues/verbal cues  FIM - Upper Body Dressing/Undressing Upper body dressing/undressing steps patient completed: Thread/unthread right sleeve of pullover shirt/dresss;Pull shirt over trunk;Thread/unthread left sleeve of pullover shirt/dress;Put head through opening of pull over shirt/dress Upper body dressing/undressing: 5: Set-up assist to: Obtain clothing/put away FIM - Lower Body Dressing/Undressing Lower body dressing/undressing steps patient completed: Thread/unthread right underwear leg;Thread/unthread left underwear leg;Thread/unthread right pants leg;Thread/unthread left pants leg Lower body dressing/undressing: 2: Max-Patient completed 25-49% of tasks  FIM - Toileting Toileting: 0: Activity did not occur  FIM - ArchivistToilet Transfers Toilet Transfers: 0-Activity did not occur  FIM - BankerBed/Chair Transfer Bed/Chair Transfer Assistive Devices: Bed rails;Prosthesis;Walker Bed/Chair Transfer: 4: Bed > Chair or W/C: Min A (steadying Pt. > 75%)  FIM - Locomotion: Wheelchair Distance: 150 Locomotion: Wheelchair: 2: Travels 50 - 149 ft with supervision, cueing or coaxing FIM - Locomotion: Ambulation Locomotion: Ambulation Assistive Devices: Designer, industrial/productWalker - Rolling Ambulation/Gait Assistance: 4: Min guard Locomotion: Ambulation: 1: Travels less than 50 ft with minimal assistance (Pt.>75%)  Comprehension Comprehension Mode: Auditory Comprehension: 5-Follows basic conversation/direction: With no assist  Expression Expression Mode:  Verbal Expression: 5-Expresses basic needs/ideas: With no assist  Social Interaction Social Interaction: 6-Interacts appropriately with others with medication or extra time (anti-anxiety, antidepressant).  Problem Solving Problem Solving: 5-Solves basic problems: With no assist  Memory Memory: 7-Complete Independence: No helper  Medical Problem List and Plan:  Deconditioning secondary to cardiac arrest in a patient with end-stage renal disease  1. DVT Prophylaxis/Anticoagulation: Mechanical: Sequential compression devices, below knee Bilateral lower extremities and increase mobility  2. Pain Management: Tramadol prn and tylenol tid for pain.  3. Mood: pt is in good spirits, team to provide egosupport  4. Neuropsych: This patient is capable of making decisions on her own behalf.  5. CAD with h/o recurrent PEA: Has refused further work up. Continue tramadol for chest wall pain due to CPR.  6. Recurrent GIB: Continue PPI bid and pepcid at HS.  7. ESRD: HD ongoing M, W, F. Able to maintain BP with proamatine prior to procedure and other adjustments to HD process.   daily weights. Modified diet with FR.   -has had cardiac arrests on HD  -team working on options moving forward 8. L-ICA stenosis: To follow up with Dr. Corliss Skains past discharge.  9. 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  11. Acute on chronic anemia: Monitor with routine checks and transfuse prn. Continue aranesp.  12.DM type 2: Will monitor with ac/hs checks. Continue SSI for now. Used amaryl 4 mg at home- resume 1mg  daily   -fair control at present   LOS (Days) 3 A FACE TO FACE EVALUATION WAS PERFORMED  Francess Mullen T 10/18/2013 8:20 AM

## 2013-10-19 ENCOUNTER — Inpatient Hospital Stay (HOSPITAL_COMMUNITY): Payer: Medicare Other | Admitting: Physical Therapy

## 2013-10-19 ENCOUNTER — Inpatient Hospital Stay (HOSPITAL_COMMUNITY): Payer: Medicare Other

## 2013-10-19 ENCOUNTER — Inpatient Hospital Stay (HOSPITAL_COMMUNITY): Payer: Medicare Other | Admitting: *Deleted

## 2013-10-19 LAB — GLUCOSE, CAPILLARY
GLUCOSE-CAPILLARY: 159 mg/dL — AB (ref 70–99)
Glucose-Capillary: 122 mg/dL — ABNORMAL HIGH (ref 70–99)
Glucose-Capillary: 129 mg/dL — ABNORMAL HIGH (ref 70–99)
Glucose-Capillary: 138 mg/dL — ABNORMAL HIGH (ref 70–99)

## 2013-10-19 NOTE — Progress Notes (Signed)
Occupational Therapy Session Note  Patient Details  Name: Briana Gould MRN: [id number] Date of Birth: 01-29-1946  Today's Date: 10/19/2013 Time: [id number] Time Calculation (min): 65 min  Short Term Goals: Week 1:  OT Short Term Goal 1 (Week 1): Patient will complete grooming wheelchair level at the sink independently. OT Short Term Goal 2 (Week 1): Patient will transfer wheelchair to/from toilet with use of grab bars with supervision. OT Short Term Goal 3 (Week 1): Patient will transfer wheelchair to/from shower with use of grab bars with supervision. OT Short Term Goal 4 (Week 1): Patient will complete toileting with minimum assistance stand balance for clothing management up and down. OT Short Term Goal 5 (Week 1): Patient will complete LB dressing with minimum assistance stand balance.  Skilled Therapeutic Interventions/Progress Updates: ADL-retraining with focus on improved dynamic standing balance, sit <> stand, transfers, and energy conservation.   Patient received sitting at edge of bed, partially dressed (upper body 100%, underwear to mid-thigh and pants to knees).  Patient required re-ed on role of therapist and OT treatments, RN tech support, and use of daily schedule for assist and performance of BADL.   Patient was assisted with transfer to w/c (see FIM) but deferred bathing in order to complete seated grooming, spending much time cleaning her dentures, despite numerous verbal cues on time remaining to complete all BADL.   With setup assist, patient bathed upper body and upper legs but elected to focus on replacing shoe on prosthesis with remainder of session.      Therapy Documentation Precautions:  Precautions Precautions: None Required Braces or Orthoses: Other Brace/Splint Other Brace/Splint: BKA- RLE with prosthesis. Pt able to donn herself.  Restrictions Weight Bearing Restrictions: No  Pain: Pain Assessment Pain Assessment: No/denies pain  See FIM for current  functional status  Therapy/Group: Individual Therapy  Second session: Time: 1300-1345 Time Calculation (min):  45 min  Pain Assessment: No pain  Skilled Therapeutic Interventions: Therapeutic activities with focus on improved activity tolerance, dynamic standing balance, and general strengthening of bil UE in prep for improved performance of sit <> stand.  Patient received in w/c in her room, attending to hygiene at sink.   Patient oriented to need for increased endurance and UE strength for improved transfers and dynamic standing.   Patient escorted to gym to complete UE ergometry using Sci_Fit and UE strengthening using 2 lb dumbell.   Patient completed 5 min UE ergometry but required reduced resistance, level 1.0 and 1 rest break after 3 minutes before stopping due to complaint of right forearm pain from exertion.   Patient performed 2 sets of UE strengthening with mod assist to stabilize left shoulder during isolated triceps exercise.   Patient reports having a 3 lb weight at home but she does not use it; OT re-educated pt on benefit of theraband versus dumbell for HEP.   Patient concluded session at craft activity, standing supported at tabletop to assemble Valentine's Day displays for 5 minutes.  See FIM for current functional status  Therapy/Group: Individual Therapy  Chelsy Parrales 10/19/2013, 10:17 AM

## 2013-10-19 NOTE — Discharge Summary (Signed)
I have seen this pt and agree with above Dorcas CarrowPatrick WrightMD

## 2013-10-19 NOTE — Progress Notes (Signed)
Physical Therapy Session Note  Patient Details  Name: Briana Gould MRN: [id number] Date of Birth: [birth year]  Today's Date: 10/19/2013 Time: 1030-1130 Time Calculation (min): 60 min  Skilled Therapeutic Interventions/Progress Updates:    Pain: no c/o  Ambulation all the way to the gym without rest break! 20177' with RW and min-guard assist, w/c to follow. Standing balance without UE support on compliant surface then tiled surface with varied bases of support + UE curls (2# weights) focusing on strengthening vestibular/visual systems for balance. NuStep level 2 progressing to 4 x 10 min total with multiple rest breaks, overall slow steps/min due to poor endurance. Ambulation x 40' with RW min-guard assist at end of session, limited distance due to decreased endurance.   Prosthesis donned entire session. Rt LE elevated.   Second Session Time:  [id number] Time Calculation (min): 30 min Skilled Therapeutic Interventions/Progress Updates:   Ambulation x 8780' with RW and min-guard assist, slight pain in Lt LE pt reports she needed to apply another sock however unable to find sock until very end of session. Rest of session focused on short distance ambulation in home environment with RW and sit <> stands from low compliant surfaces performed with min assist. Min verbal cues for positioning of RW for safety and for slow controlled descent with stand > sit.   Prosthesis donned entire session, removed at end of session. Rt LE elevated.  Therapy Documentation Precautions:  Precautions Precautions: None Required Braces or Orthoses: Other Brace/Splint Other Brace/Splint: BKA- RLE with prosthesis. Pt able to donn herself.  Restrictions Weight Bearing Restrictions: No   See FIM for current functional status  Therapy/Group: Individual Therapy both sessions  [name], Aprill Banko Cheek 10/19/2013, 12:16 PM

## 2013-10-19 NOTE — Progress Notes (Signed)
Subjective/Complaints: Feeling well. No cough, sob, chest pain, palpitations A 12 point review of systems has been performed and if not noted above is otherwise negative.   Objective: Vital Signs: Blood pressure 135/76, pulse 83, temperature 98.3 F (36.8 C), temperature source Oral, resp. rate 18, height 5\' 6"  (1.676 m), weight 84.8 kg (186 lb 15.2 oz), SpO2 96.00%. No results found.  Recent Labs  10/18/13 1630  WBC 9.0  HGB 9.7*  HCT 29.6*  PLT 228    Recent Labs  10/18/13 1630  NA 134*  K 4.6  CL 92*  GLUCOSE 137*  BUN 48*  CREATININE 8.30*  CALCIUM 10.5   CBG (last 3)   Recent Labs  10/18/13 1201 10/18/13 2053 10/19/13 0731  GLUCAP 177* 106* 122*    Wt Readings from Last 3 Encounters:  10/19/13 84.8 kg (186 lb 15.2 oz)  10/15/13 81.1 kg (178 lb 12.7 oz)  10/15/13 81.1 kg (178 lb 12.7 oz)    Physical Exam:   General: Alert and oriented x 3, No apparent distress. obese HEENT: Head is normocephalic, atraumatic, PERRLA, EOMI, sclera anicteric, oral mucosa pink and moist, dentition intact, ext ear canals clear,  Neck: Supple without JVD or lymphadenopathy Heart: Reg rate and rhythm. With SEM. no rubs or gallops Chest: CTA bilaterally without wheezes, rales, or rhonchi; no distress Abdomen: Soft, non-tender, non-distended, bowel sounds positive. Extremities: No clubbing, cyanosis, or trace LE edema. Pulses are 2+ Skin: Clean and intact without signs of breakdown. Left BK well healed. Neuro: Pt is cognitively appropriate with normal insight, memory, and awareness. Cranial nerves 2-12 are intact. Sensory exam is normal except for decreased PP RLE distally. Reflexes are 2+ in all 4's. Fine motor coordination is intact. No tremors. Motor function is grossly 5/5. Good trunk control Musculoskeletal: Full ROM, No pain with AROM or PROM in the neck, trunk, or extremities. Posture appropriate Psych: Pt's affect is appropriate. Pt is  cooperative    Assessment/Plan: 1. Functional deficits secondary to deconditioning which require 3+ hours per day of interdisciplinary therapy in a comprehensive inpatient rehab setting. Physiatrist is providing close team supervision and 24 hour management of active medical problems listed below. Physiatrist and rehab team continue to assess barriers to discharge/monitor patient progress toward functional and medical goals.  Team conference today.   FIM: FIM - Bathing Bathing Steps Patient Completed: Chest;Right Arm;Left Arm;Abdomen;Front perineal area;Buttocks;Right upper leg;Left upper leg;Right lower leg (including foot) Bathing: 5: Supervision: Safety issues/verbal cues  FIM - Upper Body Dressing/Undressing Upper body dressing/undressing steps patient completed: Thread/unthread right bra strap;Thread/unthread left bra strap;Thread/unthread right sleeve of pullover shirt/dresss;Thread/unthread left sleeve of pullover shirt/dress;Put head through opening of pull over shirt/dress;Pull shirt over trunk Upper body dressing/undressing: 4: Min-Patient completed 75 plus % of tasks FIM - Lower Body Dressing/Undressing Lower body dressing/undressing steps patient completed: Thread/unthread right underwear leg;Thread/unthread left underwear leg Lower body dressing/undressing: 2: Max-Patient completed 25-49% of tasks  FIM - Toileting Toileting: 0: Activity did not occur  FIM - Archivist Transfers: 0-Activity did not occur  FIM - Banker Devices: Bed rails;Prosthesis;Walker Bed/Chair Transfer: 4: Bed > Chair or W/C: Min A (steadying Pt. > 75%)  FIM - Locomotion: Wheelchair Distance: 150 Locomotion: Wheelchair: 2: Travels 50 - 149 ft with supervision, cueing or coaxing FIM - Locomotion: Ambulation Locomotion: Ambulation Assistive Devices: Designer, industrial/product Ambulation/Gait Assistance: 4: Min guard Locomotion: Ambulation: 1: Travels  less than 50 ft with  minimal assistance (Pt.>75%)  Comprehension Comprehension Mode: Auditory Comprehension: 6-Follows complex conversation/direction: With extra time/assistive device  Expression Expression Mode: Verbal Expression: 6-Expresses complex ideas: With extra time/assistive device  Social Interaction Social Interaction: 6-Interacts appropriately with others with medication or extra time (anti-anxiety, antidepressant).  Problem Solving Problem Solving: 5-Solves complex 90% of the time/cues < 10% of the time  Memory Memory: 7-Complete Independence: No helper  Medical Problem List and Plan:  Deconditioning secondary to cardiac arrest in a patient with end-stage renal disease  1. DVT Prophylaxis/Anticoagulation: Mechanical: Sequential compression devices, below knee Bilateral lower extremities and increase mobility  2. Pain Management: Tramadol prn and tylenol tid for pain.  3. Mood: pt is in good spirits, team to provide egosupport  4. Neuropsych: This patient is capable of making decisions on her own behalf.  5. CAD with h/o recurrent PEA: Has refused further work up. Continue tramadol for chest wall pain due to CPR.  6. Recurrent GIB: Continue PPI bid and pepcid at HS.  7. ESRD: HD ongoing M, W, F. Able to maintain BP with proamatine prior to procedure and other adjustments to HD process.   daily weights. Modified diet with FR.   -has had cardiac arrests on HD  -team working on options moving forward 8. L-ICA stenosis: To follow up with Dr. Corliss Skainseveshwar past discharge.  9. 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   11. Acute on chronic anemia: Monitor with routine checks and transfuse prn. Continue aranesp.  12.DM type 2: Will monitor with ac/hs checks. Continue SSI for now. Used amaryl 4 mg at home- resume 1mg  daily   -fair control at present   LOS (Days) 4 A FACE TO FACE EVALUATION WAS PERFORMED  Briana Gould  T 10/19/2013 8:10 AM

## 2013-10-19 NOTE — Progress Notes (Signed)
Recreational Therapy Assessment and Plan  Patient Details  Name: Briana Gould MRN: [id number] Date of Birth: 1946/03/20 Today's Date: 10/19/2013  Rehab Potential: Good ELOS: 7 days   Assessment Clinical Impression: Problem List:  Patient Active Problem List    Diagnosis  Date Noted   .  Physical deconditioning  10/15/2013   .  Preop cardiovascular exam  10/11/2013   .  CVA (cerebral infarction)  10/01/2013   .  Paroxysmal a-fib  10/01/2013   .  H/O: GI bleed  09/25/2013   .  Cardiac arrest  09/20/2013   .  ESRD (end stage renal disease)  09/20/2013   .  Acute respiratory failure with hypoxia  09/20/2013   .  Hypertension  09/20/2013   .  Encephalopathy acute  09/20/2013   .  Bradycardia  09/03/2013   .  Anemia of chronic renal failure  08/30/2013   .  Secondary hyperparathyroidism  08/30/2013   .  GIB (gastrointestinal bleeding)  08/29/2013   .  CAD (coronary artery disease)  08/16/2013   .  History of syncope  08/16/2013   .  Aortic stenosis  08/16/2013   .  R Jugular vein thrombosis  08/15/2013   .  Cerebrovascular disease  08/15/2013   .  Thrombocytopenia  08/14/2013   .  Seizure  08/13/2013   .  PAD (peripheral artery disease)  08/13/2013   .  Weakness generalized  11/08/2011   .  ESRD (end stage renal disease) on dialysis  11/04/2011   .  MITRAL REGURGITATION  10/04/2010   .  AORTIC STENOSIS  10/04/2010   .  DM  09/13/2010   .  HYPERLIPIDEMIA  09/13/2010   .  OBESITY  09/13/2010   .  HYPERTENSION  09/13/2010   .  PULMONARY HYPERTENSION  09/13/2010   .  BKA, LEFT LEG  09/13/2010    Past Medical History:  Past Medical History   Diagnosis  Date   .  PAD (peripheral artery disease)      a. L BKA   .  Type II diabetes mellitus    .  Anemia    .  History of blood transfusion  2002     "related to amputation" (08/13/2013)   .  End stage renal disease on dialysis      "[facility]; M, W, F" (08/13/2013)   .  DVT (deep venous thrombosis)      a.  R IJ DVT 08/2013.   Marland Kitchen  CVA (cerebral infarction)      a. MRI 08/2013: possible left motor strip infarct with remote pontine infarct.   .  Pulmonary HTN      a. Cath 2011 - mod to severe pulmonary hypertension. b. Echo 08/2013: PAP 67mHg (mild-mod increased).   .  Thrombocytopenia    .  CAD (coronary artery disease)      a. Severe diffuse diabetic disease by cath 2011, for med rx, not a candidate for CABG or PCI at that time.   .  Aortic stenosis    .  Syncope      a. 2011, etiology never clear.   .  Carotid artery disease      a. Dopp 103/5009 13-81%LICA< 682-99%RICA borderline >80%.   .  Cerebrovascular disease      a. 08/2013 MRA brain: multiple bilateral moderate and high grade intracranial stenoses   .  Cardiac arrest  08/14/13     a. Possible arrest in setting  of hypotension during dialysis with possible arrest requiring brief CPR, not intubated and was fully intact after episode. Suspected due to low cerebral perfusion in setting of hypotension. Unclear if any arrhythmia.   .  End stage renal disease on dialysis    .  Peripheral arterial occlusive disease    .  Hypertension    .  Cardiac arrest    .  Diabetes mellitus type 2, uncontrolled, with complications    .  DVT (deep venous thrombosis)      Right internal jugular vein   .  CAD (coronary artery disease), native coronary artery  2011     Diffuse distal vessel CAD noted at cardiac catheterization    Past Surgical History:  Past Surgical History   Procedure  Laterality  Date   .  Av fistula placement  Left  2011     upper arm   .  Below knee leg amputation  Left  2002   .  Tonsillectomy     .  Cataract extraction w/ intraocular lens implant, bilateral  Bilateral  2012-2014   .  Esophagogastroduodenoscopy  N/A  08/30/2013     Procedure: ESOPHAGOGASTRODUODENOSCOPY (EGD); Surgeon: Beryle Beams, MD; Location: [facility] ENDOSCOPY; Service: Endoscopy; Laterality: N/A;   .  Below knee leg amputation  Left    .  Av fistula placement   Left     Assessment & Plan  Clinical Impression: Patient is a 68 y.o. year old female with recent admission to the hospital on 09/20/2013 with PEA arrest. Per chart review, Briana Gould is a 68 y.o. right-handed female with history of diabetes mellitus, coronary artery disease, CVA with little residual, left below-knee amputation 13 years ago, end-stage renal disease with hemodialysis and cardiac arrest while on hemodialysis who was discharged 08/29/2012 after being treated at [facility] for upper GI bleeding in setting of Coumadin use(IJ thrombus) readmitted 09/20/2013 for PEA arrest. Patient did receive CPR x20 minutes. Cranial CT scan showed age uncertain a small infarct mid pons. Small infarct in this area felt to be possibly recent. Echocardiogram ejection fraction of 55% and no ST segment elevation on EKG. MRA showed significant intracranial atherosclerotic type changes. Carotid Dopplers 60-79% right ICA stenosis. Patient not felt to be a surgical intervention candidate. 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 Patient was extubated 09/23/2013. 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 60% stenosis of right ICA approximately and 50% stenosis left ICA petrous segment. She was started on antiplatelets in anticipation of stent placement but she developed A Fib with RVR on 10/07/13 but did convert to NSR. She developed hematemesis 10/08/13 due to recurrent GIB and required 2 units PRBC. Cardiac cath recommended by Dr. Johnsie Cancel to rule out development of cardiac disease (last cath 4 years ago/ recurrent cardiac arrests) but patient and family declined. Plans for L-ICA stent/angio in the future when stable. Hypotension controlled with midodrine support. Patient deconditioned and has difficulty with transitional  movements. Rehab team recommending CIR. Patient transferred to CIR on 10/15/2013.   Pt presents with decreased activity tolerance, decreased functional mobility, decreased balance limiting pt's independence with leisure/community pursuits.  Leisure History/Participation Premorbid leisure interest/current participation: [place] Other Leisure Interests: Reading Leisure Participation Style: With Family/Friends;Alone Awareness of Community Resources: Good-identify 3 post discharge leisure resources Psychosocial / Spiritual Patient agreeable to Pet Therapy:  No Does patient have pets?: No Social interaction - Mood/Behavior: Cooperative Engineer, drilling for Education?: Yes Recreational Therapy Orientation Orientation -Reviewed with patient: Available activity resources Strengths/Weaknesses Patient Strengths/Abilities: Willingness to participate;Active premorbidly Patient weaknesses: Physical limitations TR Patient demonstrates impairments in the following area(s): Endurance  Plan Rec Therapy Plan Is patient appropriate for Therapeutic Recreation?: Yes Rehab Potential: Good Treatment times per week: Min 1 time per week >20 minutes Estimated Length of Stay: 7 days TR Treatment/Interventions: Adaptive equipment instruction;1:1 session;Balance/vestibular training;Functional mobility training;Community reintegration;Patient/family education;Therapeutic activities;Recreation/leisure participation;Therapeutic exercise  Recommendations for other services: None  Discharge Criteria: Patient will be discharged from TR if patient refuses treatment 3 consecutive times without medical reason.  If treatment goals not met, if there is a change in medical status, if patient makes no progress towards goals or if patient is discharged from hospital.  The above assessment, treatment plan, treatment alternatives and goals were discussed and mutually  agreed upon: by patient  [place] 10/19/2013, 11:09 AM

## 2013-10-20 ENCOUNTER — Inpatient Hospital Stay (HOSPITAL_COMMUNITY): Payer: Medicare Other

## 2013-10-20 ENCOUNTER — Inpatient Hospital Stay (HOSPITAL_COMMUNITY): Payer: Medicare Other | Admitting: *Deleted

## 2013-10-20 ENCOUNTER — Inpatient Hospital Stay (HOSPITAL_COMMUNITY): Payer: Medicare Other | Admitting: Physical Therapy

## 2013-10-20 DIAGNOSIS — Z8673 Personal history of transient ischemic attack (TIA), and cerebral infarction without residual deficits: Secondary | ICD-10-CM

## 2013-10-20 DIAGNOSIS — E1129 Type 2 diabetes mellitus with other diabetic kidney complication: Secondary | ICD-10-CM

## 2013-10-20 DIAGNOSIS — R5381 Other malaise: Secondary | ICD-10-CM

## 2013-10-20 DIAGNOSIS — E1165 Type 2 diabetes mellitus with hyperglycemia: Secondary | ICD-10-CM

## 2013-10-20 DIAGNOSIS — I2510 Atherosclerotic heart disease of native coronary artery without angina pectoris: Secondary | ICD-10-CM

## 2013-10-20 DIAGNOSIS — N186 End stage renal disease: Secondary | ICD-10-CM

## 2013-10-20 LAB — RENAL FUNCTION PANEL
Albumin: 3.1 g/dL — ABNORMAL LOW (ref 3.5–5.2)
BUN: 37 mg/dL — ABNORMAL HIGH (ref 6–23)
CALCIUM: 10.6 mg/dL — AB (ref 8.4–10.5)
CO2: 25 mEq/L (ref 19–32)
Chloride: 93 mEq/L — ABNORMAL LOW (ref 96–112)
Creatinine, Ser: 6.95 mg/dL — ABNORMAL HIGH (ref 0.50–1.10)
GFR calc non Af Amer: 5 mL/min — ABNORMAL LOW (ref >90)
GFR, EST AFRICAN AMERICAN: 6 mL/min — AB (ref >90)
Glucose, Bld: 103 mg/dL — ABNORMAL HIGH (ref 70–99)
POTASSIUM: 4.6 meq/L (ref 3.7–5.3)
Phosphorus: 4.6 mg/dL (ref 2.3–4.6)
SODIUM: 136 meq/L — AB (ref 137–147)

## 2013-10-20 LAB — CBC
HCT: 31.6 % — ABNORMAL LOW (ref 36.0–46.0)
Hemoglobin: 10.3 g/dL — ABNORMAL LOW (ref 12.0–15.0)
MCH: 31.9 pg (ref 26.0–34.0)
MCHC: 32.6 g/dL (ref 30.0–36.0)
MCV: 97.8 fL (ref 78.0–100.0)
Platelets: 230 10*3/uL (ref 150–400)
RBC: 3.23 MIL/uL — AB (ref 3.87–5.11)
RDW: 17.7 % — ABNORMAL HIGH (ref 11.5–15.5)
WBC: 9.1 10*3/uL (ref 4.0–10.5)

## 2013-10-20 LAB — GLUCOSE, CAPILLARY
GLUCOSE-CAPILLARY: 114 mg/dL — AB (ref 70–99)
Glucose-Capillary: 133 mg/dL — ABNORMAL HIGH (ref 70–99)
Glucose-Capillary: 156 mg/dL — ABNORMAL HIGH (ref 70–99)
Glucose-Capillary: 107 mg/dL — ABNORMAL HIGH (ref 70–99)

## 2013-10-20 MED ORDER — DARBEPOETIN ALFA-POLYSORBATE 200 MCG/0.4ML IJ SOLN
INTRAMUSCULAR | Status: AC
  Administered 2013-10-20: 200 ug via INTRAVENOUS
  Filled 2013-10-20: qty 0.4

## 2013-10-20 MED ORDER — DOXERCALCIFEROL 4 MCG/2ML IV SOLN
INTRAVENOUS | Status: AC
  Administered 2013-10-20: 8 ug via INTRAVENOUS
  Filled 2013-10-20: qty 4

## 2013-10-20 NOTE — Progress Notes (Signed)
Subjective/Complaints: Feeling well. Getting stronger. Sleeping well. A 12 point review of systems has been performed and if not noted above is otherwise negative.   Objective: Vital Signs: Blood pressure 132/81, pulse 80, temperature 98.4 F (36.9 C), temperature source Oral, resp. rate 18, height 5\' 6"  (1.676 m), weight 83.9 kg (184 lb 15.5 oz), SpO2 100.00%. No results found.  Recent Labs  10/18/13 1630  WBC 9.0  HGB 9.7*  HCT 29.6*  PLT 228    Recent Labs  10/18/13 1630  NA 134*  K 4.6  CL 92*  GLUCOSE 137*  BUN 48*  CREATININE 8.30*  CALCIUM 10.5   CBG (last 3)   Recent Labs  10/19/13 1242 10/19/13 1657 10/19/13 2107  GLUCAP 159* 129* 138*    Wt Readings from Last 3 Encounters:  10/20/13 83.9 kg (184 lb 15.5 oz)  10/15/13 81.1 kg (178 lb 12.7 oz)  10/15/13 81.1 kg (178 lb 12.7 oz)    Physical Exam:   General: Alert and oriented x 3, No apparent distress. obese HEENT: Head is normocephalic, atraumatic, PERRLA, EOMI, sclera anicteric, oral mucosa pink and moist, dentition intact, ext ear canals clear,  Neck: Supple without JVD or lymphadenopathy Heart: Reg rate and rhythm. With SEM. no rubs or gallops Chest: CTA bilaterally without wheezes, rales, or rhonchi; no distress Abdomen: Soft, non-tender, non-distended, bowel sounds positive. Extremities: No clubbing, cyanosis, persistent 1+ LE edema. Pulses are 2+ Skin: Clean and intact without signs of breakdown. Left BK well healed. Neuro: Pt is cognitively appropriate with normal insight, memory, and awareness. Cranial nerves 2-12 are intact. Sensory exam is normal except for decreased PP RLE distally. Reflexes are 2+ in all 4's. Fine motor coordination is intact. No tremors. Motor function is grossly 5/5. Good trunk control Musculoskeletal: Full ROM, No pain with AROM or PROM in the neck, trunk, or extremities. Posture appropriate Psych: Pt's affect is appropriate. Pt is  cooperative    Assessment/Plan: 1. Functional deficits secondary to deconditioning which require 3+ hours per day of interdisciplinary therapy in a comprehensive inpatient rehab setting. Physiatrist is providing close team supervision and 24 hour management of active medical problems listed below. Physiatrist and rehab team continue to assess barriers to discharge/monitor patient progress toward functional and medical goals.    FIM: FIM - Bathing Bathing Steps Patient Completed: Chest;Right Arm;Left Arm;Abdomen;Right upper leg;Left upper leg Bathing: 3: Mod-Patient completes 5-7 1251f 10 parts or 50-74%  FIM - Upper Body Dressing/Undressing Upper body dressing/undressing steps patient completed: Thread/unthread right sleeve of pullover shirt/dresss;Thread/unthread left sleeve of pullover shirt/dress;Put head through opening of pull over shirt/dress Upper body dressing/undressing: 4: Min-Patient completed 75 plus % of tasks FIM - Lower Body Dressing/Undressing Lower body dressing/undressing steps patient completed: Thread/unthread right underwear leg;Thread/unthread left underwear leg;Pull underwear up/down;Thread/unthread right pants leg;Thread/unthread left pants leg;Pull pants up/down Lower body dressing/undressing: 2: Max-Patient completed 25-49% of tasks  FIM - Toileting Toileting: 0: Activity did not occur  FIM - ArchivistToilet Transfers Toilet Transfers: 0-Activity did not occur  FIM - BankerBed/Chair Transfer Bed/Chair Transfer Assistive Devices: Bed rails;Prosthesis;Walker Bed/Chair Transfer: 4: Bed > Chair or W/C: Min A (steadying Pt. > 75%)  FIM - Locomotion: Wheelchair Distance: 150 Locomotion: Wheelchair: 2: Travels 50 - 149 ft with supervision, cueing or coaxing FIM - Locomotion: Ambulation Locomotion: Ambulation Assistive Devices: Designer, industrial/productWalker - Rolling Ambulation/Gait Assistance: 4: Min guard Locomotion: Ambulation: 4: Travels 150 ft or more with minimal assistance  (Pt.>75%)  Comprehension Comprehension  Mode: Auditory Comprehension: 6-Follows complex conversation/direction: With extra time/assistive device  Expression Expression Mode: Verbal Expression: 6-Expresses complex ideas: With extra time/assistive device  Social Interaction Social Interaction: 6-Interacts appropriately with others with medication or extra time (anti-anxiety, antidepressant).  Problem Solving Problem Solving: 5-Solves complex 90% of the time/cues < 10% of the time  Memory Memory: 7-Complete Independence: No helper  Medical Problem List and Plan:  Deconditioning secondary to cardiac arrest in a patient with end-stage renal disease  1. DVT Prophylaxis/Anticoagulation: Mechanical: Sequential compression devices, below knee Bilateral lower extremities and increase mobility  2. Pain Management: Tramadol prn and tylenol tid for pain.  3. Mood: pt is in good spirits, team to provide egosupport  4. Neuropsych: This patient is capable of making decisions on her own behalf.  5. CAD with h/o recurrent PEA: Has refused further work up. Continue tramadol for chest wall pain due to CPR.  6. Recurrent GIB: Continue PPI bid and pepcid at HS.  7. ESRD: HD ongoing M, W, F. Able to maintain BP with proamatine prior to procedure and other adjustments to HD process.   daily weights. Modified diet with FR.   -has had cardiac arrests on HD  -team working on options moving forward 8. L-ICA stenosis: To follow up with Dr. Corliss Skains after discharge.  9. 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   11. Acute on chronic anemia: Monitor with routine checks and transfuse prn. Continue aranesp.  12.DM type 2: Will monitor with ac/hs checks. Continue SSI for now. Used amaryl 4 mg at home- resume 1mg  daily   -good control at present   LOS (Days) 5 A FACE TO FACE EVALUATION WAS PERFORMED  Vaden Becherer T 10/20/2013 8:09 AM

## 2013-10-20 NOTE — Progress Notes (Signed)
Occupational Therapy Session Note  Patient Details  Name: Briana Gould MRN: [id number] Date of Birth: [birth year]  Today's Date: 10/20/2013 Time: 0730-0830 Time Calculation (min): 60 min  Short Term Goals: Week 1:  OT Short Term Goal 1 (Week 1): Patient will complete grooming wheelchair level at the sink independently. OT Short Term Goal 2 (Week 1): Patient will transfer wheelchair to/from toilet with use of grab bars with supervision. OT Short Term Goal 3 (Week 1): Patient will transfer wheelchair to/from shower with use of grab bars with supervision. OT Short Term Goal 4 (Week 1): Patient will complete toileting with minimum assistance stand balance for clothing management up and down. OT Short Term Goal 5 (Week 1): Patient will complete LB dressing with minimum assistance stand balance.  Skilled Therapeutic Interventions/Progress Updates: ADL-retraining with focus on safety awareness, transfers, dynamic standing balance, problem-solving, and adapted dressing skills.   Patient received seated in her w/c awaiting assistance with transfer from w/c to toilet.   Patient donned prosthesis and transferred from bed to w/c with RN tech support but wheeled herself into bathroom and was considering transfer unassisted.   OT re-educated patient on safety plan and continued need for min assist for transfers for safety.   Patient acknowledged and completed toileting, calling for assist for transfer to tub bench when she finished.   Patient progressed through ADL,sitting and standing, as needed using grab bar and RW with questioning cues provided, and requesting physical assist only for donning right sock and shoe this session (see FIM).    Therapy Documentation Precautions:  Precautions Precautions: None Required Braces or Orthoses: Other Brace/Splint Other Brace/Splint: BKA- RLE with prosthesis. Pt able to donn herself.  Restrictions Weight Bearing Restrictions: No  Vital Signs: Therapy  Vitals Temp: 98.4 F (36.9 C) Temp src: Oral Pulse Rate: 80 Resp: 18 BP: 132/81 mmHg Patient Position, if appropriate: Lying Oxygen Therapy SpO2: 100 % O2 Device: None (Room air)  Pain: No report of pain    See FIM for current functional status  Therapy/Group: Individual Therapy  Second session: Time: 1420-1450 Time Calculation (min):  30 min  Pain Assessment: No pain  Skilled Therapeutic Interventions: Therapeutic activity with focus on endurance and dynamic standing balance; therapeutic exercise with focus on seated UE strengthening using thera-band.   Patient tasked with loading wash in washing machine, after ambulating from her room using RW.  Patient ambulated 55110' using RW with supervision for safety.  Patient completed task as instructed with 1 rest break to recover, seated in w/c.  At w/c level, patient was then instructed on 4 of 8 UE exercises to improve UE strength in prep for transfers.   Patient required hand guidance for correct movements for each exercise and reported good challenge to shoulders and upper arms.   Patient was escorted back to her room in her w/c, call light placed within reach.  See FIM for current functional status  Therapy/Group: Individual Therapy  Briana Gould 10/20/2013, 8:30 AM

## 2013-10-20 NOTE — Progress Notes (Signed)
Social Work [name], female   DOB: 11-30-1945, 68 y.o.   MRN: [id number]  Met yesterday afternoon following team conference.  Pt disappointed in targeted d/c date of 2/19 - had hoped would be sooner - but she is agreeable.  Understands reasoning behind date and agrees she is still very deconditioned.  No other concerns.  Continue to follow.  Aswad Wandrey, LCSW

## 2013-10-20 NOTE — Progress Notes (Addendum)
Physical Therapy Note  Patient Details  Name: Briana Gould MRN: [id number] Date of Birth: 10-Feb-1946 Today's Date: 10/20/2013  [id number] (25 minutes) individual Pain : no reported pain Focus of treatment: Therapeutic exercise focused on bilateral LE strengthening/ activity tolerance Treatment: Pt finishing breakfast; transfers stand/turn RW SBA; Nustep Level 4 X 10 minutes; pt tolerated without rest break..  1135-1200 (25 minutes) individual Pain; No reported pain Focus of treatment: sit to stand ; standing tolerance ; gait training Treatment: Transfers stand/turn RW with vcs to lock wc SBA ; sit to stand x 5 from raised mat; pt stands with forward flexed trunk to maintain balance; standing tolerance to BS table X 2.5 minutes; gait - 80 feet X 1 RW + LT prosthesis SBA; returned to room with all needs within reach.   1300-1340 (40 minutes) individual Pain: no reported pain Focus of treatment: Therapeutic exercise focused on activity tolerance Treatment: Nustep Level 4 x 10 minutes; standing tolerance x 2 minutes x 2 to RW (Wii bowling)  Saige Busby,JIM 10/20/2013, 9:12 AM

## 2013-10-20 NOTE — Progress Notes (Signed)
[facility] Progress Note  Subjective:   No emerging c/o's.  Rehab tough but going well  Objective Filed Vitals:   10/19/13 0500 10/19/13 1600 10/20/13 0459 10/20/13 0700  BP: 135/76 151/76 132/81   Pulse: 83 83 80   Temp: 98.3 F (36.8 C) 98.5 F (36.9 C) 98.4 F (36.9 C)   TempSrc: Oral Oral Oral   Resp: 18 19 18    Height:      Weight: 84.8 kg (186 lb 15.2 oz)   83.9 kg (184 lb 15.5 oz)  SpO2: 96% 98% 100%    Physical Exam General: Alert, cooperative, NAD Heart: RRR, 2/6 murmur LUSB Lungs: CTA bilat, no wheezes or rhonchi Abdomen: Soft, NT, + BS Extremities: L BKA, RLE with ++edema and ischemic changes (stated as improving per pt) Dialysis Access: L AVF + bruit  Dialysis: MWF [facility]  4.5h EDW was 79kg, now around 80-82kg (subtract 2.4kg for prosthesis) Prof 2 No heparin  Hect 8ug EPO 20k.   Assessment/Plan: 1. Deconditioning - Inpatient rehab, possibly through 2/19 2. Recurrent cardiac arrest (PEA) - pt refused further cardiac w/u (heart cath)  3. Severe L carotid stenosis - family and Dr Corliss Skainseveshwar leaning towards medical management  4. Recurrent GIB - on bid PPI and pepcid at night  5. ESRD - SBPs 130's. HD today. Keep BP > 125, midodrine pre HD, family considering home HD but no change in HD schedule for now. Standing wgts with prosthetic then subtract 2.4kg for goal.  6. Anemia - Hgb 9.7 on Aranesp 200 q wed, transfused. CBC pending 7. 2HPTH - Ca /Phos controlled. Continue Hectorol 8 and renvela. Renal panel pending.  Ca high, will stop vit D 8. PAD - s/p L BKA, RLE with edema and ischemic changes. Will watch closely.  Scot JunKaren E. Thad RangerWarren, PA-C [facility] Pager [phone] 10/20/2013,3:26 PM  LOS: 5 days   I have seen and examined patient, discussed with PA and agree with assessment and plan as outlined above. Vinson Moselleob Barbarann Kelly MD pager [phone]    cell [phone] 10/20/2013, 4:02 PM    Additional Objective Labs: Basic Metabolic  Panel:  Recent Labs Lab 10/15/13 0510 10/15/13 0731 10/18/13 1630  NA  --  139 134*  K  --  4.0 4.6  CL  --  97 92*  CO2  --  27 25  GLUCOSE  --  198* 137*  BUN  --  24* 48*  CREATININE 6.03* 5.95* 8.30*  CALCIUM  --  9.9 10.5  PHOS  --  3.9 4.0   Liver Function Tests:  Recent Labs Lab 10/15/13 0731 10/18/13 1630  ALBUMIN 2.7* 2.7*   No results found for this basename: LIPASE, AMYLASE,  in the last 168 hours CBC:  Recent Labs Lab 10/15/13 0731 10/18/13 1630  WBC 10.8* 9.0  HGB 9.1* 9.7*  HCT 28.9* 29.6*  MCV 98.6 98.0  PLT 200 228   Blood Culture    Component Value Date/Time   SDES BLOOD RIGHT HEMODIALYSIS CATHETER 08/14/2013 1045   SPECREQUEST BOTTLES DRAWN AEROBIC AND ANAEROBIC 5CC FROM [facility] 08/14/2013 1045   CULT  Value: DIPHTHEROIDS(CORYNEBACTERIUM SPECIES) Note: Standardized susceptibility testing for this organism is not available. Note: Gram Stain Report Called to,Read Back By and Verified With: Raelyn NumberVictoria Ingram RN on 08/16/13 at 06:08 by Christie NottinghamAnne Skeen Performed at [facility] 08/14/2013 1045   REPTSTATUS 08/17/2013 FINAL 08/14/2013 1045    CBG:  Recent Labs Lab 10/19/13 1242 10/19/13 1657 10/19/13 2107 10/20/13  0810 10/20/13 1112  GLUCAP 159* 129* 138* 133* 156*   Studies/Results: No results found.  Medications: . sodium chloride     . darbepoetin (ARANESP) injection - DIALYSIS  200 mcg Intravenous Q Wed-HD  . docusate sodium  100 mg Oral BID  . doxercalciferol  8 mcg Intravenous Q M,W,F-HD  . famotidine  20 mg Oral QHS  . feeding supplement (NEPRO CARB STEADY)  237 mL Oral QPC supper  . glimepiride  1 mg Oral Q breakfast  . insulin aspart  0-9 Units Subcutaneous TID WC  . midodrine  5 mg Oral Q M,W,F-HD  . multivitamin  1 tablet Oral QHS  . sevelamer carbonate  1,600 mg Oral TID WC  . sodium chloride  3 mL Intravenous Q12H

## 2013-10-20 NOTE — Patient Care Conference (Signed)
Inpatient RehabilitationTeam Conference and Plan of Care Update Date: 10/19/2013   Time: 2:25 PM    Patient Name: Briana Gould      Medical Record Number: [id number]  Date of Birth: 09/30/1945 Sex: Female         Room/Bed: 4W08C/4W08C-01 Payor Info: Payor: MEDICARE / Plan: MEDICARE PART A AND B / Product Type: *No Product type* /    Admitting Diagnosis: OLD BKA, PEA ARREST, ESRD   Admit Date/Time:  10/15/2013  2:54 PM Admission Comments: No comment available   Primary Diagnosis:  <principal problem not specified> Principal Problem: <principal problem not specified>  Patient Active Problem List   Diagnosis Date Noted  . Physical deconditioning 10/15/2013  . Preop cardiovascular exam 10/11/2013  . CVA (cerebral infarction) 10/01/2013  . Paroxysmal a-fib 10/01/2013  . H/O: GI bleed 09/25/2013  . Cardiac arrest 09/20/2013  . ESRD (end stage renal disease) 09/20/2013  . Acute respiratory failure with hypoxia 09/20/2013  . Hypertension 09/20/2013  . Encephalopathy acute 09/20/2013  . Bradycardia 09/03/2013  . Anemia of chronic renal failure 08/30/2013  . Secondary hyperparathyroidism 08/30/2013  . GIB (gastrointestinal bleeding) 08/29/2013  . CAD (coronary artery disease) 08/16/2013  . History of syncope 08/16/2013  . Aortic stenosis 08/16/2013  . 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 Cerebrovascular disease 08/15/2013  . Thrombocytopenia 08/14/2013  . Seizure 08/13/2013  . PAD (peripheral artery disease) 08/13/2013  . Weakness generalized 11/08/2011  . ESRD (end stage renal disease) on dialysis 11/04/2011  . MITRAL REGURGITATION 10/04/2010  . AORTIC STENOSIS 10/04/2010  . DM 09/13/2010  . HYPERLIPIDEMIA 09/13/2010  . OBESITY 09/13/2010  . HYPERTENSION 09/13/2010  . PULMONARY HYPERTENSION 09/13/2010  . BKA, LEFT LEG 09/13/2010    Expected Discharge Date: Expected Discharge Date: 10/28/13  Team Members Present: Physician leading conference: Dr. Faith RogueZachary  Swartz Social Worker Present: Amada JupiterLucy Edla Para, LCSW Nurse Present: Other (comment) Tennis Must(Luz Rosero, RN) PT Present: Karolee StampsAlison Gray, Jerrye BushyPT;Hannah [name], PT OT Present: Donzetta KohutFrank Barthold, OT;Kris Gellert, OT;Patricia Mat Carnelay, OT PPS Coordinator present : Tora DuckMarie Noel, RN, CRRN     Current Status/Progress Goal Weekly Team Focus  Medical   deconditioning related CV, numerous cardiac arrests. on HD  increase stamina and activity tolerance  volume mgt, bp control, increase activity tolerance   Bowel/Bladder   oliguria, dialysis MWF, [place] bowel, LBM 10/17/13   remain continent bowel  Remain continnet of bowel   Swallow/Nutrition/ Hydration             ADL's   Supervision bathing, mod assist dressing, min assist transfers  Mod I bathe/dress, supervision shower transfer, mod I toileting  dynamic standing balance, general strengthening and endurance, bathing and dressing skills re-training   Mobility   min assist  mod I  safety with ambulation, endurance, dynamic balance, community integration    Communication             Safety/Cognition/ Behavioral Observations            Pain   No c/o pain   less <3  assess for pain q shift   Skin   No skin issues  No skin breakdown  assess skin for any skin breakdown    Rehab Goals Patient on target to meet rehab goals: Yes *See Care Plan and progress notes for long and short-term goals.  Barriers to Discharge: stamina    Possible Resolutions to Barriers:  repetition, stamina and endurance training during functional activities    Discharge Planning/Teaching Needs:  home with family  able  to provide supervision and intermittent physical assist      Team Discussion:  Still very weak and needs extra time to complete tasks.  Hopeful to reach mod i goals overall  Revisions to Treatment Plan:  None   Continued Need for Acute Rehabilitation Level of Care: The patient requires daily medical management by a physician with specialized training in physical  medicine and rehabilitation for the following conditions: Daily direction of a multidisciplinary physical rehabilitation program to ensure safe treatment while eliciting the highest outcome that is of practical value to the patient.: Yes Daily medical management of patient stability for increased activity during participation in an intensive rehabilitation regime.: Yes Daily analysis of laboratory values and/or radiology reports with any subsequent need for medication adjustment of medical intervention for : Cardiac problems (renal)  Haeven Nickle 10/21/2013, 4:21 PM

## 2013-10-20 NOTE — Progress Notes (Signed)
Recreational Therapy Session Note  Patient Details  Name: Yolando Swanstonvaldes MRN: [id number] Date of Birth: Abriel 12, 1947 Today's Date: 10/20/2013  Pain: no c/o Skilled Therapeutic Interventions/Progress Updates: Session focused on discharge planning as pt stated she was disappointed in longer LOS than she expected.  Discussed pt's prior level of function & weekly activities and compared them to current level of functioning, pt stated understanding for longer LOS identifying her endurance as her biggest challenge.  Brysin Towery 10/20/2013, 1:46 PM

## 2013-10-21 ENCOUNTER — Inpatient Hospital Stay (HOSPITAL_COMMUNITY): Payer: Medicare Other | Admitting: Physical Therapy

## 2013-10-21 ENCOUNTER — Ambulatory Visit (HOSPITAL_COMMUNITY): Payer: Medicare Other | Admitting: *Deleted

## 2013-10-21 ENCOUNTER — Inpatient Hospital Stay (HOSPITAL_COMMUNITY): Payer: Medicare Other

## 2013-10-21 LAB — GLUCOSE, CAPILLARY
Glucose-Capillary: 110 mg/dL — ABNORMAL HIGH (ref 70–99)
Glucose-Capillary: 132 mg/dL — ABNORMAL HIGH (ref 70–99)
Glucose-Capillary: 126 mg/dL — ABNORMAL HIGH (ref 70–99)

## 2013-10-21 NOTE — Progress Notes (Signed)
Physical Therapy Session Note  Patient Details  Name: Briana Gould MRN: [id number] Date of Birth: 10-20-1945  Today's Date: 10/21/2013 Time: [id number] Time Calculation (min): 55 min  Skilled Therapeutic Interventions/Progress Updates:    Ambulation continuous to gym 177', 100', 100', 6560' with RW and supervision, pt reporting she felt prosthesis' heel high off ground in current shoes. Trial of heel lift -pt reporting significant improvement. Curb/Ramp x 2 reps with min assist progressing to supervision with RW, some difficulty with strength stepping up curb. Stairs x 2 with two rails attempting to only use one rail (mod assist). Mini squats x 7 reps and w/c propulsion x 150' for generalized strengthening and conditioning. Discussed appropriateness of using ramp in community and not stairs if possible. Also discussed calling brother and daughter to provide assistance with caring for grandchildren for at least next two weeks. Pt verbalized understanding. Prosthesis donned entire session.   Second Session Time:  1130-1200 Time Calculation (min): 30 min Skilled Therapeutic Interventions/Progress Updates:  Ambulation around hospital clothing and jewelry sale in community environment (~100'), tight space negotiation and negotiation of crowded people with RW all while being cognizant of fatigue level - supervision. Pt able to visually scan bil directions without significant loss of balance, decreased gait speed as compensation however. Outdoor ambulation x 120' over uneven paved terrain, ascending/descending incline with RW and close supervision.   Therapy Documentation Precautions:  Precautions Precautions: None Required Braces or Orthoses: Other Brace/Splint Other Brace/Splint: BKA- RLE with prosthesis. Pt able to donn herself.  Restrictions Weight Bearing Restrictions: No Pain: Pain Assessment Pain Assessment: No/denies pain either sessions   See FIM for current functional  status  Therapy/Group: Individual Therapy both sessions  [name], [name] 10/21/2013, 9:29 AM

## 2013-10-21 NOTE — Progress Notes (Signed)
Subjective/Complaints: Feeling well.  No new complaints. No cp, sob, palpitations. A 12 point review of systems has been performed and if not noted above is otherwise negative.   Objective: Vital Signs: Blood pressure 149/66, pulse 80, temperature 97 F (36.1 C), temperature source Oral, resp. rate 18, height 5\' 6"  (1.676 m), weight 79.701 kg (175 lb 11.3 oz), SpO2 96.00%. No results found.  Recent Labs  10/18/13 1630 10/20/13 1730  WBC 9.0 9.1  HGB 9.7* 10.3*  HCT 29.6* 31.6*  PLT 228 230    Recent Labs  10/18/13 1630 10/20/13 1730  NA 134* 136*  K 4.6 4.6  CL 92* 93*  GLUCOSE 137* 103*  BUN 48* 37*  CREATININE 8.30* 6.95*  CALCIUM 10.5 10.6*   CBG (last 3)   Recent Labs  10/20/13 1647 10/20/13 2156 10/21/13 0730  GLUCAP 114* 107* 110*    Wt Readings from Last 3 Encounters:  10/21/13 79.701 kg (175 lb 11.3 oz)  10/15/13 81.1 kg (178 lb 12.7 oz)  10/15/13 81.1 kg (178 lb 12.7 oz)    Physical Exam:   General: Alert and oriented x 3, No apparent distress. obese HEENT: Head is normocephalic, atraumatic, PERRLA, EOMI, sclera anicteric, oral mucosa pink and moist, dentition intact, ext ear canals clear,  Neck: Supple without JVD or lymphadenopathy Heart: Reg rate and rhythm. With SEM. no rubs or gallops Chest: CTA bilaterally without wheezes, rales, or rhonchi; no distress Abdomen: Soft, non-tender, non-distended, bowel sounds positive. Extremities: No clubbing, cyanosis, persistent 1+ LE edema. Pulses are 2+ Skin: Clean and intact without signs of breakdown. Left BK well healed. Neuro: Pt is cognitively appropriate with normal insight, memory, and awareness. Cranial nerves 2-12 are intact. Sensory exam is normal except for decreased PP RLE distally. Reflexes are 2+ in all 4's. Fine motor coordination is intact. No tremors. Motor function is grossly 5/5. Good trunk control Musculoskeletal: Full ROM, No pain with AROM or PROM in the neck, trunk, or  extremities. Posture appropriate Psych: Pt's affect is appropriate. Pt is cooperative    Assessment/Plan: 1. Functional deficits secondary to deconditioning which require 3+ hours per day of interdisciplinary therapy in a comprehensive inpatient rehab setting. Physiatrist is providing close team supervision and 24 hour management of active medical problems listed below. Physiatrist and rehab team continue to assess barriers to discharge/monitor patient progress toward functional and medical goals.    FIM: FIM - Bathing Bathing Steps Patient Completed: Chest;Right Arm;Left Arm;Abdomen;Front perineal area;Buttocks;Right upper leg;Left upper leg;Right lower leg (including foot) Bathing: 6: More than reasonable amount of time  FIM - Upper Body Dressing/Undressing Upper body dressing/undressing steps patient completed: Thread/unthread right bra strap;Thread/unthread left bra strap;Hook/unhook bra;Thread/unthread right sleeve of pullover shirt/dresss;Thread/unthread left sleeve of pullover shirt/dress;Put head through opening of pull over shirt/dress;Pull shirt over trunk Upper body dressing/undressing: 7: Complete Independence: No helper FIM - Lower Body Dressing/Undressing Lower body dressing/undressing steps patient completed: Thread/unthread right underwear leg;Thread/unthread left underwear leg;Pull underwear up/down;Thread/unthread right pants leg;Thread/unthread left pants leg;Pull pants up/down Lower body dressing/undressing: 4: Min-Patient completed 75 plus % of tasks  FIM - Toileting Toileting steps completed by patient: Adjust clothing prior to toileting;Performs perineal hygiene;Adjust clothing after toileting Toileting: 6: More than reasonable amount of time  FIM - Diplomatic Services operational officerToilet Transfers Toilet Transfers Assistive Devices: Bedside commode Toilet Transfers: 5-To toilet/BSC: Supervision (verbal cues/safety issues);5-From toilet/BSC: Supervision (verbal cues/safety issues)  FIM -  Bed/Chair Transfer Bed/Chair Transfer Assistive Devices: Bed rails;Prosthesis;Walker Bed/Chair Transfer: 4:  Bed > Chair or W/C: Min A (steadying Pt. > 75%)  FIM - Locomotion: Wheelchair Distance: 150 Locomotion: Wheelchair: 2: Travels 50 - 149 ft with supervision, cueing or coaxing FIM - Locomotion: Ambulation Locomotion: Ambulation Assistive Devices: Designer, industrial/product Ambulation/Gait Assistance: 4: Min guard Locomotion: Ambulation: 4: Travels 150 ft or more with minimal assistance (Pt.>75%)  Comprehension Comprehension Mode: Auditory Comprehension: 6-Follows complex conversation/direction: With extra time/assistive device  Expression Expression Mode: Verbal Expression: 6-Expresses complex ideas: With extra time/assistive device  Social Interaction Social Interaction: 6-Interacts appropriately with others with medication or extra time (anti-anxiety, antidepressant).  Problem Solving Problem Solving: 5-Solves basic problems: With no assist  Memory Memory: 5-Requires cues to use assistive device  Medical Problem List and Plan:  Deconditioning secondary to cardiac arrest in a patient with end-stage renal disease  1. DVT Prophylaxis/Anticoagulation: Mechanical: Sequential compression devices, below knee Bilateral lower extremities and increase mobility  2. Pain Management: Tramadol prn and tylenol tid for pain.  3. Mood: pt is in good spirits, team to provide egosupport  4. Neuropsych: This patient is capable of making decisions on her own behalf.  5. CAD with h/o recurrent PEA: Has refused further work up. Continue tramadol for chest wall pain due to CPR.  6. Recurrent GIB: Continue PPI bid and pepcid at HS.  7. ESRD: HD ongoing M, W, F. Able to maintain BP with proamatine prior to procedure and other adjustments to HD process.   daily weights. Modified diet with FR.   -has had cardiac arrests on HD--no problems since coming to rehab  -team working on options moving forward 8.  L-ICA stenosis: To follow up with Dr. Corliss Skains after discharge.  9. 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   11. Acute on chronic anemia: Monitor with routine checks and transfuse prn. Continue aranesp.  12.DM type 2: Will monitor with ac/hs checks. Continue SSI for now. Used amaryl 4 mg at home- on 1mg  daily currently   -good control at present   LOS (Days) 6 A FACE TO FACE EVALUATION WAS PERFORMED  Nabeeha Badertscher T 10/21/2013 9:09 AM

## 2013-10-21 NOTE — Progress Notes (Signed)
Occupational Therapy Session Note  Patient Details  Name: Briana Gould MRN: [id number] Date of Birth: Jul 14, 1946  Today's Date: 10/21/2013 Time: 0730-0825 Time Calculation (min): 55 min  Short Term Goals: Week 1:  OT Short Term Goal 1 (Week 1): Patient will complete grooming wheelchair level at the sink independently. OT Short Term Goal 2 (Week 1): Patient will transfer wheelchair to/from toilet with use of grab bars with supervision. OT Short Term Goal 3 (Week 1): Patient will transfer wheelchair to/from shower with use of grab bars with supervision. OT Short Term Goal 4 (Week 1): Patient will complete toileting with minimum assistance stand balance for clothing management up and down. OT Short Term Goal 5 (Week 1): Patient will complete LB dressing with minimum assistance stand balance.  Skilled Therapeutic Interventions/Progress Updates:  ADL-retraining with focus on transfers, safety awareness, lower body dressing skills, and dynamic standing balance.   Patient progressing well with endurance and transfers, now requiring min verbal cues for positioning w/c and locking brakes.  Patient completes bathing unassisted at walk-in shower, to include sit <> stand as needed to bathe lower body.   Patient able to to propel w/c from bathroom to bedside to complete seated dressing with good safety awareness when standing to pull up underwear and pants, unassisted however she remains dependent for care and dressing of right foot, sock and shoe.    Therapy Documentation Precautions:  Precautions Precautions: None Required Braces or Orthoses: Other Brace/Splint Other Brace/Splint: BKA- RLE with prosthesis. Pt able to donn herself.  Restrictions Weight Bearing Restrictions: No  Vital Signs: Therapy Vitals Temp: 97 F (36.1 C) Temp src: Oral Pulse Rate: 80 Resp: 18 BP: 149/66 mmHg Patient Position, if appropriate: Sitting Oxygen Therapy SpO2: 96 %  Pain: Pain Assessment Pain  Assessment: No/denies pain  See FIM for current functional status  Therapy/Group: Individual Therapy  Second session: Time: 1300-1345 Time Calculation (min): 45 min  Pain Assessment: No pain  Skilled Therapeutic Interventions: 15 min of UE therex using elastic band.   Patient provided written exercises with graphics and required 1:1 supervision and hand guidance for proper execution of exercises.   30 min of therapeutic activity with focus on functional mobility using RW, general endurance, dynamic standing balance, performance of repeated sit <> stand.  Patient ambulated from room to day room to perform task of assembling Valentine's Day greeting card display while standing at table top.   Patient sustained 15 of standing to include mobility prior to requesting 1 rest break of 3 minutes before continuing task.  Patient able to tolerate continued standing but required 3 additional seated rest breaks during task.   Pt required repeated instruction on task concept and intent but she sustained attention and participation beyond scheduled treatment time due to presence of a friend, Briana Gould, who visited and provoked additional enhancements to patient's Sport and exercise psychologistdisplay design.  See FIM for current functional status  Therapy/Group: Individual Therapy  [name] 10/21/2013, 8:28 AM

## 2013-10-21 NOTE — Progress Notes (Signed)
Recreational Therapy Session Note  Patient Details  Name: Briana Gould MRN: [id number] Date of Birth: 1946/05/12 Today's Date: 10/21/2013  Pain: no c/o Skilled Therapeutic Interventions/Progress Updates: Session focused on activity tolerance, ambulation with RW/functional mobility, dynamic standing balance with & without UE support with supervision.  Pt stood at tabletop to [employer] Valentine card display with a visiting friend.  Jordynn Perrier 10/21/2013, 3:07 PM

## 2013-10-21 NOTE — Progress Notes (Signed)
Social Work [name], female   DOB: 03/15/1946, 68 y.o.   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  Physical deconditioning  10/15/2013   .  Preop cardiovascular exam  10/11/2013   .  CVA (cerebral infarction)  10/01/2013   .  Paroxysmal a-fib  10/01/2013   .  H/O: GI bleed  09/25/2013   .  Cardiac arrest  09/20/2013   .  ESRD (end stage renal disease)  09/20/2013   .  Acute respiratory failure with hypoxia  09/20/2013   .  Hypertension  09/20/2013   .  Encephalopathy acute  09/20/2013   .  Bradycardia  09/03/2013   .  Anemia of chronic renal failure  08/30/2013   .  Secondary hyperparathyroidism  08/30/2013   .  GIB (gastrointestinal bleeding)  08/29/2013   .  CAD (coronary artery disease)  08/16/2013   .  History of syncope  08/16/2013   .  Aortic stenosis  08/16/2013   .  R Jugular vein thrombosis  08/15/2013   .  Cerebrovascular disease  08/15/2013   .  Thrombocytopenia  08/14/2013   .  Seizure  08/13/2013   .  PAD (peripheral artery disease)  08/13/2013   .  Weakness generalized  11/08/2011   .  ESRD (end stage renal disease) on dialysis  11/04/2011   .  MITRAL REGURGITATION  10/04/2010   .  AORTIC STENOSIS  10/04/2010   .  DM  09/13/2010   .   HYPERLIPIDEMIA  09/13/2010   .  OBESITY  09/13/2010   .  HYPERTENSION  09/13/2010   .  PULMONARY HYPERTENSION  09/13/2010   .  BKA, LEFT LEG  09/13/2010     Expected Discharge Date: Expected Discharge Date: 10/28/13  Team Members Present: Physician leading conference: Dr. Faith RogueZachary Swartz Social Worker Present: Briana JupiterLucy Makya Phillis, LCSW Nurse Present: Other (comment) Tennis Must(Luz Rosero, RN) PT Present: Karolee StampsAlison Gray, Jerrye BushyPT;Hannah [name], PT OT Present: Donzetta KohutFrank Barthold, OT;Kris Gellert, OT;Patricia Mat Carnelay, OT PPS Coordinator present : Tora DuckMarie Noel, RN, CRRN        Current Status/Progress  Goal  Weekly Team Focus   Medical     deconditioning related CV, numerous cardiac arrests. on HD  increase stamina and activity tolerance  volume mgt, bp control, increase activity tolerance   Bowel/Bladder     oliguria, dialysis MWF, Continnet bowel, LBM 10/17/13   remain continent bowel  Remain continnet of bowel   Swallow/Nutrition/ Hydration            ADL's     Supervision bathing, mod assist dressing, min assist transfers  Mod I bathe/dress, supervision shower transfer, mod I toileting  dynamic standing balance, general  strengthening and endurance, bathing and dressing skills re-training   Mobility     min assist  mod I  safety with ambulation, endurance, dynamic balance, community integration    Communication            Safety/Cognition/ Behavioral Observations           Pain     No c/o pain   less <3  assess for pain q shift   Skin     No skin issues  No skin breakdown  assess skin for any skin breakdown    Rehab Goals Patient on target to meet rehab goals: Yes *See Care Plan and progress notes for long and short-term goals.    Barriers to Discharge:  stamina     Possible Resolutions to Barriers:    repetition, stamina and endurance training during functional activities      Discharge Planning/Teaching Needs:    home with family  able to provide supervision and intermittent physical assist       Team Discussion:    Still very weak and needs extra time to complete tasks.  Hopeful to reach mod i goals overall   Revisions to Treatment Plan:    None    Continued Need for Acute Rehabilitation Level of Care: The patient requires daily medical management by a physician with specialized training in physical medicine and rehabilitation for the following conditions: Daily direction of a multidisciplinary physical rehabilitation program to ensure safe treatment while eliciting the highest outcome that is of practical value to the patient.: Yes Daily medical management of patient stability for increased activity during participation in an intensive rehabilitation regime.: Yes Daily analysis of laboratory values and/or radiology reports with any subsequent need for medication adjustment of medical intervention for : Cardiac problems (renal)  Briana Gould 10/21/2013, 4:21 PM

## 2013-10-22 ENCOUNTER — Inpatient Hospital Stay (HOSPITAL_COMMUNITY): Payer: Medicare Other

## 2013-10-22 ENCOUNTER — Inpatient Hospital Stay (HOSPITAL_COMMUNITY): Payer: Medicare Other | Admitting: Physical Therapy

## 2013-10-22 LAB — RENAL FUNCTION PANEL
ALBUMIN: 3.1 g/dL — AB (ref 3.5–5.2)
BUN: 26 mg/dL — ABNORMAL HIGH (ref 6–23)
CHLORIDE: 95 meq/L — AB (ref 96–112)
CO2: 25 mEq/L (ref 19–32)
Calcium: 10.5 mg/dL (ref 8.4–10.5)
Creatinine, Ser: 6.14 mg/dL — ABNORMAL HIGH (ref 0.50–1.10)
GFR, EST AFRICAN AMERICAN: 7 mL/min — AB (ref >90)
GFR, EST NON AFRICAN AMERICAN: 6 mL/min — AB (ref >90)
Glucose, Bld: 166 mg/dL — ABNORMAL HIGH (ref 70–99)
POTASSIUM: 4.5 meq/L (ref 3.7–5.3)
Phosphorus: 4 mg/dL (ref 2.3–4.6)
SODIUM: 138 meq/L (ref 137–147)

## 2013-10-22 LAB — GLUCOSE, CAPILLARY
GLUCOSE-CAPILLARY: 107 mg/dL — AB (ref 70–99)
GLUCOSE-CAPILLARY: 103 mg/dL — AB (ref 70–99)
GLUCOSE-CAPILLARY: 192 mg/dL — AB (ref 70–99)
GLUCOSE-CAPILLARY: 65 mg/dL — AB (ref 70–99)
GLUCOSE-CAPILLARY: 83 mg/dL (ref 70–99)
Glucose-Capillary: 162 mg/dL — ABNORMAL HIGH (ref 70–99)

## 2013-10-22 LAB — CBC
HCT: 33.7 % — ABNORMAL LOW (ref 36.0–46.0)
Hemoglobin: 10.5 g/dL — ABNORMAL LOW (ref 12.0–15.0)
MCH: 30.7 pg (ref 26.0–34.0)
MCHC: 31.2 g/dL (ref 30.0–36.0)
MCV: 98.5 fL (ref 78.0–100.0)
PLATELETS: 213 10*3/uL (ref 150–400)
RBC: 3.42 MIL/uL — ABNORMAL LOW (ref 3.87–5.11)
RDW: 17.6 % — AB (ref 11.5–15.5)
WBC: 8.9 10*3/uL (ref 4.0–10.5)

## 2013-10-22 NOTE — Progress Notes (Signed)
Occupational Therapy Session Note  Patient Details  Name: Briana Gould MRN: [id number] Date of Birth: [birth year]  Today's Date: 10/22/2013 Time: 1300-1330 Time Calculation (min): 30 min  Short Term Goals: Week 1:  OT Short Term Goal 1 (Week 1): Patient will complete grooming wheelchair level at the sink independently. OT Short Term Goal 1 - Progress (Week 1): Met OT Short Term Goal 2 (Week 1): Patient will transfer wheelchair to/from toilet with use of grab bars with supervision. OT Short Term Goal 2 - Progress (Week 1): Met OT Short Term Goal 3 (Week 1): Patient will transfer wheelchair to/from shower with use of grab bars with supervision. OT Short Term Goal 3 - Progress (Week 1): Met OT Short Term Goal 4 (Week 1): Patient will complete toileting with minimum assistance stand balance for clothing management up and down. OT Short Term Goal 4 - Progress (Week 1): Met OT Short Term Goal 5 (Week 1): Patient will complete LB dressing with minimum assistance stand balance. OT Short Term Goal 5 - Progress (Week 1): Met  Week 2: STG=LTG  Skilled Therapeutic Interventions/Progress Updates:    Pt was seated on wc upon arrival.  Pt participated in simple meal prep activity in therapy apartment utilizing RW to ambulate and min verbal cues to complete task.  Pt took several rest breaks.  Pt required max safety cues for transporting items while using RW.  Pt self propelled wc to collect laundry and then self propelled back to room.  Pt had no c/o pain.    Therapy Documentation Precautions:  Precautions Precautions: None Required Braces or Orthoses: Other Brace/Splint Other Brace/Splint: BKA- RLE with prosthesis. Pt able to donn herself.  Restrictions Weight Bearing Restrictions: No   Therapy/Group: Individual Therapy  Koichi Platte 10/22/2013, 2:56 PM

## 2013-10-22 NOTE — Progress Notes (Signed)
Subjective/Complaints: Feeling well.  Up at sink already. Denies pain, insomnia, sob A 12 point review of systems has been performed and if not noted above is otherwise negative.   Objective: Vital Signs: Blood pressure 146/78, pulse 82, temperature 98.3 F (36.8 C), temperature source Oral, resp. rate 17, height 5\' 6"  (1.676 m), weight 78.2 kg (172 lb 6.4 oz), SpO2 98.00%. No results found.  Recent Labs  10/20/13 1730  WBC 9.1  HGB 10.3*  HCT 31.6*  PLT 230    Recent Labs  10/20/13 1730  NA 136*  K 4.6  CL 93*  GLUCOSE 103*  BUN 37*  CREATININE 6.95*  CALCIUM 10.6*   CBG (last 3)   Recent Labs  10/21/13 1216 10/21/13 1747 10/22/13 0723  GLUCAP 132* 126* 103*    Wt Readings from Last 3 Encounters:  10/22/13 78.2 kg (172 lb 6.4 oz)  10/15/13 81.1 kg (178 lb 12.7 oz)  10/15/13 81.1 kg (178 lb 12.7 oz)    Physical Exam:   General: Alert and oriented x 3, No apparent distress. obese HEENT: Head is normocephalic, atraumatic, PERRLA, EOMI, sclera anicteric, oral mucosa pink and moist, dentition intact, ext ear canals clear,  Neck: Supple without JVD or lymphadenopathy Heart: Reg rate and rhythm. With SEM. no rubs or gallops Chest: CTA bilaterally without wheezes, rales, or rhonchi; no distress Abdomen: Soft, non-tender, non-distended, bowel sounds positive. Extremities: No clubbing, cyanosis, persistent 1+ LE edema. Pulses are 2+ Skin: Clean and intact without signs of breakdown. Left BK well healed. Neuro: Pt is cognitively appropriate with normal insight, memory, and awareness. Cranial nerves 2-12 are intact. Sensory exam is normal except for decreased PP RLE distally. Reflexes are 2+ in all 4's. Fine motor coordination is intact. No tremors. Motor function is grossly 5/5. Good trunk control Musculoskeletal: Full ROM, No pain with AROM or PROM in the neck, trunk, or extremities. Posture appropriate Psych: Pt's affect is appropriate. Pt is  cooperative    Assessment/Plan: 1. Functional deficits secondary to deconditioning which require 3+ hours per day of interdisciplinary therapy in a comprehensive inpatient rehab setting. Physiatrist is providing close team supervision and 24 hour management of active medical problems listed below. Physiatrist and rehab team continue to assess barriers to discharge/monitor patient progress toward functional and medical goals.  Pt is anxious to leave earlier if possible  FIM: FIM - Bathing Bathing Steps Patient Completed: Chest;Right Arm;Left Arm;Abdomen;Front perineal area;Buttocks;Right upper leg;Left upper leg;Right lower leg (including foot) Bathing: 6: More than reasonable amount of time  FIM - Upper Body Dressing/Undressing Upper body dressing/undressing steps patient completed: Thread/unthread right bra strap;Thread/unthread left bra strap;Hook/unhook bra;Thread/unthread right sleeve of pullover shirt/dresss;Thread/unthread left sleeve of pullover shirt/dress;Put head through opening of pull over shirt/dress;Pull shirt over trunk Upper body dressing/undressing: 7: Complete Independence: No helper FIM - Lower Body Dressing/Undressing Lower body dressing/undressing steps patient completed: Thread/unthread right underwear leg;Thread/unthread left underwear leg;Pull underwear up/down;Thread/unthread right pants leg;Thread/unthread left pants leg;Pull pants up/down Lower body dressing/undressing: 4: Min-Patient completed 75 plus % of tasks  FIM - Toileting Toileting steps completed by patient: Adjust clothing prior to toileting;Performs perineal hygiene;Adjust clothing after toileting Toileting: 6: More than reasonable amount of time  FIM - Diplomatic Services operational officer Devices: Bedside commode Toilet Transfers: 5-To toilet/BSC: Supervision (verbal cues/safety issues);5-From toilet/BSC: Supervision (verbal cues/safety issues)  FIM - Bed/Chair Transfer Bed/Chair Transfer  Assistive Devices: Bed rails;Prosthesis;Walker Bed/Chair Transfer: 4: Bed > Chair or W/C: Min A (  steadying Pt. > 75%)  FIM - Locomotion: Wheelchair Distance: 150 Locomotion: Wheelchair: 5: Travels 150 ft or more: maneuvers on rugs and over door sills with supervision, cueing or coaxing FIM - Locomotion: Ambulation Locomotion: Ambulation Assistive Devices: Designer, industrial/productWalker - Rolling Ambulation/Gait Assistance: 5: Supervision Locomotion: Ambulation: 5: Travels 150 ft or more with supervision/safety issues  Comprehension Comprehension Mode: Auditory Comprehension: 6-Follows complex conversation/direction: With extra time/assistive device  Expression Expression Mode: Verbal Expression: 6-Expresses complex ideas: With extra time/assistive device  Social Interaction Social Interaction: 6-Interacts appropriately with others with medication or extra time (anti-anxiety, antidepressant).  Problem Solving Problem Solving: 5-Solves basic problems: With no assist  Memory Memory: 5-Requires cues to use assistive device  Medical Problem List and Plan:  Deconditioning secondary to cardiac arrest in a patient with end-stage renal disease  1. DVT Prophylaxis/Anticoagulation: Mechanical: Sequential compression devices, below knee Bilateral lower extremities and increase mobility  2. Pain Management: Tramadol prn and tylenol tid for pain.  3. Mood: pt is in good spirits, team to provide egosupport  4. Neuropsych: This patient is capable of making decisions on her own behalf.  5. CAD with h/o recurrent PEA: Has refused further work up. Continue tramadol for chest wall pain due to CPR.  6. Recurrent GIB: Continue PPI bid and pepcid at HS.  7. ESRD: HD ongoing M, W, F. Able to maintain BP with proamatine prior to procedure and other adjustments to HD process.   daily weights. Modified diet with FR.   -has had cardiac arrests on HD--no problems since coming to rehab  -team working on options moving forward 8.  L-ICA stenosis: To follow up with Dr. Corliss Skainseveshwar after discharge.  9. 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   11. Acute on chronic anemia: Monitor with routine checks and transfuse prn. Continue aranesp.  12.DM type 2: Will monitor with ac/hs checks. Continue SSI for now. Used amaryl 4 mg at home- on 1mg  daily currently   -good control    LOS (Days) 7 A FACE TO FACE EVALUATION WAS PERFORMED  Maeryn Mcgath T 10/22/2013 8:12 AM

## 2013-10-22 NOTE — Procedures (Signed)
Patient was seen on dialysis and the procedure was supervised.  BFR 400  Via AVF BP is  157/63.   Patient appears to be tolerating treatment well  Yasir Kitner A 10/22/2013

## 2013-10-22 NOTE — Progress Notes (Addendum)
Subjective:   Feeling well, progressing well with rehab, anticipated discharge 2/17  Objective: Vital signs in last 24 hours: Temp:  [98 F (36.7 C)-98.3 F (36.8 C)] 98.3 F (36.8 C) (02/13 0534) Pulse Rate:  [80-82] 82 (02/13 0534) Resp:  [16-17] 17 (02/13 0534) BP: (145-146)/(61-78) 146/78 mmHg (02/13 0534) SpO2:  [98 %-99 %] 98 % (02/13 0534) Weight:  [78.2 kg (172 lb 6.4 oz)] 78.2 kg (172 lb 6.4 oz) (02/13 0534) Weight change: -1.501 kg (-3 lb 4.9 oz)  Intake/Output from previous day: 02/12 0701 - 02/13 0700 In: 840 [P.O.:840] Out: -  Intake/Output this shift: Total I/O In: 360 [P.O.:360] Out: -   Lab Results:  Recent Labs  10/20/13 1730  WBC 9.1  HGB 10.3*  HCT 31.6*  PLT 230   BMET:  Recent Labs  10/20/13 1730  NA 136*  K 4.6  CL 93*  CO2 25  GLUCOSE 103*  BUN 37*  CREATININE 6.95*  CALCIUM 10.6*  ALBUMIN 3.1*   No results found for this basename: PTH,  in the last 72 hours Iron Studies: No results found for this basename: IRON, TIBC, TRANSFERRIN, FERRITIN,  in the last 72 hours  EXAM: General appearance:  Alert, in no apparent distress Resp:  CTA without rales, rhonchi, or wheezes Cardio:  RRR with Gr II/VI systolic murmur, no rub GI:  + BS, soft and nontender Extremities:  L BKA, R lower leg wrapped in ACE Access:  AVF @ LUA with + bruit  Dialysis: MWF [facility]  4.5h EDW was 79kg, now around 80-82kg (subtract 2.4kg for prosthesis) Prof 2 No heparin  Hect 8ug EPO 20k.   Assessment/Plan: 1. Deconditioning - Improving with inpatient rehab, possibly through 2/17. 2. Recurrent cardiac arrest (PEA) - pt declined heart cath  3. Severe L carotid stenosis - family/pt and Dr Corliss Skainseveshwar leaning towards medical management.  4. Recurrent GIB - on bid PPI and Pepcid qhs. 5. ESRD - HD on MWF @ [facility], K 4.6. HD today.  6. HTN/Volume - SBP 140s, maintain BP > 125, Midodrine pre HD; family considering home HD, but no change in HD schedule for now. Standing  wgts with prosthetic then subtract 2.4kg for goal. May consider HD 4x /week as outpatient 7. Anemia - Hgb 10.3 on Aranesp 200 mcg on Wed.  8. Sec HPT - Ca 10.6 (11.3 corrected) on 2/11, P 4.6; 2Ca bath, Hectorol 8 mcg on hold sec to high Ca, Renvela 2 with meals. 9. PAD - s/p L BKA, RLE with edema and ischemic changes. Will watch closely.    LOS: 7 days   LYLES,CHARLES 10/22/2013,11:12 AM  I have seen and examined patient, discussed with PA and agree with assessment and plan as outlined above. Had a discussion with Mr Lesly DukesSwanston today regarding prognosis at discharge. Assured he that we would do what we could to prevent BP drops at HD but that even with strict orders we cannot say that the BP will be controlled at all times.  Outpatient HD is not the same as the inpatient situation and she remains at risk for complications. She seems to understand. She said during the inpatient workup for recurrent cardiac arrest that she came to a point where she eventually decided not to pursue further invasive workup (heart cath and carotid procedure) because it was "just too much". We discussed EOL issues briefly and she stated that she has a living will.  We discussed code status and she said she hadn't really considered the possibility  of "No Code Blue". Given what she has been through I encouraged her to reconsider her code status, she said she will give it some thought this weekend.  Vinson Moselle MD pager [phone]    cell [phone] 10/22/2013, 12:52 PM

## 2013-10-22 NOTE — Progress Notes (Signed)
Physical Therapy Weekly Progress Note  Patient Details  Name: Briana Gould MRN: [id number] Date of Birth: 1946-08-15  Today's Date: 10/22/2013 Time: 3254-9826 Time Calculation (min): 60 min  Patient has met 4 of 4 short term goals.  Pt currently supervision assist with 150-200' ambulation with RW and prosthesis, pt is on track for being modified independent with ambulation, transfers in controlled environment and supervision in the community. Pt is motivated and although still lacking endurance pt is making great gains.   Patient continues to demonstrate the following deficits: decreased dynamic stability, decreased functional endurance, decreased functional mobility as compared to baseline and therefore will continue to benefit from skilled PT intervention to enhance overall performance with activity tolerance, balance and ability to compensate for deficits.  Patient progressing toward long term goals..  Continue plan of care.  PT Short Term Goals Week 1:  PT Short Term Goal 1 (Week 1): Pt will be able to propel wheel chair 200 feet at mod I on level surfaces to promote community re-entry, allowing pt to attend follow up MD appointments.  PT Short Term Goal 1 - Progress (Week 1): Other (comment) (due to change of focus, pt now primarily ambulatory w/c goal not appropriate) PT Short Term Goal 2 (Week 1): Pt will be able to transfer from various surfaces with supervision to promote OOB activity and increase functional mobility.  PT Short Term Goal 2 - Progress (Week 1): Met PT Short Term Goal 3 (Week 1): Pt will be able to ambulate 50 feet using the LRAD in order to promote functional mobility within the home, allowing pt to ambulate to the bathroom as needed for toileting.  PT Short Term Goal 3 - Progress (Week 1): Met PT Short Term Goal 4 (Week 1): Pt will be able to tolerate 10-15 reps of strenth training exercises of the lower extremities to promote increased strength, ROM and overall  improved functional mobility.  PT Short Term Goal 4 - Progress (Week 1): Met   Therapy Documentation Precautions:  Precautions Precautions: None Required Braces or Orthoses: Other Brace/Splint Other Brace/Splint: BKA- RLE with prosthesis. Pt able to donn herself.  Restrictions Weight Bearing Restrictions: No  See FIM for current functional status   Lahoma Rocker 10/22/2013, 12:08 PM

## 2013-10-22 NOTE — Progress Notes (Signed)
Physical Therapy Session Note  Patient Details  Name: Briana Gould MRN: [id number] Date of Birth: 08/16/1946  Today's Date: 10/22/2013 Time: [id number] Time Calculation (min): 60 min  Skilled Therapeutic Interventions/Progress Updates:    PT re-wrapped Rt LE educating pt on wrapping (figure eight, distal pressure, and avoiding wrinkles). Ambulation >200', 180' continuous with RW, close supervision at slow pace. Car transfer x 2 SUV height with runner board and RW, supervision. Discussed again risks of driving and pt reports she will not be driving "for a while." Stairs x 4 with single railing min assist cues for safety and sequencing. Step up/downs for strengthening 2 x 5 reps Lt LE.   Second Session Time:  [id number] Time Calculation (min): 41 min Skilled Therapeutic Interventions/Progress Updates:  Reviewed limb wrapping, pt attempting wrapping trial however unable to complete by self - requires max assist. Re-educated on daily skin checks of foot. Ambulation 2 x 150' controlled environment, 150' carpeted surface forwards/back/and side stepping with RW supervision assist. Mini-squats x 10 reps, sit <> stands focusing on slow eccentric control for strengthening. Pt declines proposed outing next week for community integration.   Therapy Documentation Precautions:  Precautions Precautions: None Required Braces or Orthoses: Other Brace/Splint Other Brace/Splint: BKA- RLE with prosthesis. Pt able to donn herself.  Restrictions Weight Bearing Restrictions: No Pain:  no c/o pain either session  See FIM for current functional status  Therapy/Group: Individual Therapy both sessions  [name], [name] 10/22/2013, 11:52 AM

## 2013-10-22 NOTE — Progress Notes (Signed)
Hypoglycemic Event  CBG: 65  Treatment: 15 GM carbohydrate snack  Symptoms: None  Follow-up CBG: Time:1918 CBG Result:83  Possible Reasons for Event: Inadequate meal intake  Comments/MD notified Patient asymptomatic Will continue to monitor.   [name], [name] O  Remember to initiate Hypoglycemia Order Set & complete

## 2013-10-22 NOTE — Progress Notes (Signed)
Note reviewed and accurately reflects treatment session.   

## 2013-10-22 NOTE — Progress Notes (Signed)
Occupational Therapy Session and Weekly Progress Note  Patient Details  Name: Briana Gould MRN: [id number] Date of Birth: 22-Apr-1946  Today's Date: 10/22/2013 Time: 0730-0830  Time Calculation (min): 60 min  Patient has met 5 of 5 short term goals.    Patient continues to demonstrate the following deficits: Impaired endurance, impaired lower body dressing skills and therefore will continue to benefit from skilled OT intervention to enhance overall performance with BADL.  Patient progressing toward long term goals..  Continue plan of care.  OT Short Term Goals Week 1:  OT Short Term Goal 1 (Week 1): Patient will complete grooming wheelchair level at the sink independently. OT Short Term Goal 1 - Progress (Week 1): Met OT Short Term Goal 2 (Week 1): Patient will transfer wheelchair to/from toilet with use of grab bars with supervision. OT Short Term Goal 2 - Progress (Week 1): Met OT Short Term Goal 3 (Week 1): Patient will transfer wheelchair to/from shower with use of grab bars with supervision. OT Short Term Goal 3 - Progress (Week 1): Met OT Short Term Goal 4 (Week 1): Patient will complete toileting with minimum assistance stand balance for clothing management up and down. OT Short Term Goal 4 - Progress (Week 1): Met OT Short Term Goal 5 (Week 1): Patient will complete LB dressing with minimum assistance stand balance. OT Short Term Goal 5 - Progress (Week 1): Met Week 2:  OT Short Term Goal 1 (Week 2): STG = LTG due to short length of stay  Skilled Therapeutic Interventions/Progress Updates: ADL-retraining with focus on transfers, safety awareness, standing balance, endurance, and lower body dressing skills enhancement.   Patient now completing transfers safely, requesting supervision assist as recommended from RN staff when transferring on/off toilet (improved safety awareness).  During OT session patient completed stand-pivot transfer from w/c to tub bench using grab bar w/o  need for verbal cues for positioning or sequencing (modified independent).   Patient completed bath unassisted and returned from bathroom to bedside in w/c to complete dressing. Patient attempted use of shoe horn and elastic laces on right shoe this session but was unable to fit her foot into shoe d/t edema.   OT provided initial instruction on manual edema wrapping using ace wrap at end of session however patient was unable to duplicate wrap effectively; needs reinforcement training.    Therapy Documentation Precautions:  Precautions Precautions: None Required Braces or Orthoses: Other Brace/Splint Other Brace/Splint: BKA- RLE with prosthesis. Pt able to donn herself.  Restrictions Weight Bearing Restrictions: No  Pain: No pain   Other Treatments:    See FIM for current functional status  Therapy/Group: Individual Therapy  [place] 10/22/2013, 12:02 PM

## 2013-10-23 ENCOUNTER — Ambulatory Visit (HOSPITAL_COMMUNITY): Payer: Medicare Other | Admitting: Physical Therapy

## 2013-10-23 DIAGNOSIS — E1129 Type 2 diabetes mellitus with other diabetic kidney complication: Secondary | ICD-10-CM

## 2013-10-23 DIAGNOSIS — R5381 Other malaise: Secondary | ICD-10-CM

## 2013-10-23 DIAGNOSIS — Z8673 Personal history of transient ischemic attack (TIA), and cerebral infarction without residual deficits: Secondary | ICD-10-CM

## 2013-10-23 DIAGNOSIS — N186 End stage renal disease: Secondary | ICD-10-CM

## 2013-10-23 DIAGNOSIS — E1165 Type 2 diabetes mellitus with hyperglycemia: Secondary | ICD-10-CM

## 2013-10-23 DIAGNOSIS — I2510 Atherosclerotic heart disease of native coronary artery without angina pectoris: Secondary | ICD-10-CM

## 2013-10-23 LAB — GLUCOSE, CAPILLARY
GLUCOSE-CAPILLARY: 162 mg/dL — AB (ref 70–99)
Glucose-Capillary: 96 mg/dL (ref 70–99)
Glucose-Capillary: 178 mg/dL — ABNORMAL HIGH (ref 70–99)
Glucose-Capillary: 110 mg/dL — ABNORMAL HIGH (ref 70–99)

## 2013-10-23 NOTE — Progress Notes (Signed)
Subjective/Complaints: Feeling well.  Up at sink already. Cleaning dentures A 12 point review of systems has been performed and if not noted above is otherwise negative.   Objective: Vital Signs: Blood pressure 152/87, pulse 82, temperature 98.9 F (37.2 C), temperature source Oral, resp. rate 20, height 5\' 6"  (1.676 m), weight 79.2 kg (174 lb 9.7 oz), SpO2 100.00%. No results found.  Recent Labs  10/20/13 1730 10/22/13 1426  WBC 9.1 8.9  HGB 10.3* 10.5*  HCT 31.6* 33.7*  PLT 230 213    Recent Labs  10/20/13 1730 10/22/13 1426  NA 136* 138  K 4.6 4.5  CL 93* 95*  GLUCOSE 103* 166*  BUN 37* 26*  CREATININE 6.95* 6.14*  CALCIUM 10.6* 10.5   CBG (last 3)   Recent Labs  10/22/13 1851 10/22/13 1917 10/22/13 2046  GLUCAP 65* 83 162*    Wt Readings from Last 3 Encounters:  10/23/13 79.2 kg (174 lb 9.7 oz)  10/15/13 81.1 kg (178 lb 12.7 oz)  10/15/13 81.1 kg (178 lb 12.7 oz)    Physical Exam:   General: Alert and oriented x 3, No apparent distress. obese HEENT: Head is normocephalic, atraumatic, PERRLA, EOMI, sclera anicteric, oral mucosa pink and moist, dentition intact, ext ear canals clear,  Neck: Supple without JVD or lymphadenopathy Heart: Reg rate and rhythm. With SEM. no rubs or gallops Chest: CTA bilaterally without wheezes, rales, or rhonchi; no distress Abdomen: Soft, non-tender, non-distended, bowel sounds positive. Extremities: No clubbing, cyanosis, persistent 1+ LE edema. Pulses are 2+ Skin: Clean and intact without signs of breakdown. Left BK well healed. Neuro: Pt is cognitively appropriate with normal insight, memory, and awareness.  Fine motor coordination is intact. No tremors. Motor function is grossly 5/5. Good trunk control Musculoskeletal: Full ROM, No pain with AROM or PROM in the neck, trunk, or extremities. Posture appropriate Psych: Pt's affect is appropriate. Pt is cooperative    Assessment/Plan: 1. Functional deficits  secondary to deconditioning which require 3+ hours per day of interdisciplinary therapy in a comprehensive inpatient rehab setting. Physiatrist is providing close team supervision and 24 hour management of active medical problems listed below. Physiatrist and rehab team continue to assess barriers to discharge/monitor patient progress toward functional and medical goals.  Worried about snow predicted on D/C date  FIM: FIM - Bathing Bathing Steps Patient Completed: Chest;Right Arm;Left Arm;Abdomen;Front perineal area;Buttocks;Right upper leg;Left upper leg;Right lower leg (including foot) Bathing: 7: Complete Independence: No helper  FIM - Upper Body Dressing/Undressing Upper body dressing/undressing steps patient completed: Thread/unthread right bra strap;Thread/unthread left bra strap;Hook/unhook bra;Thread/unthread right sleeve of pullover shirt/dresss;Thread/unthread left sleeve of pullover shirt/dress;Put head through opening of pull over shirt/dress;Pull shirt over trunk Upper body dressing/undressing: 6: More than reasonable amount of time FIM - Lower Body Dressing/Undressing Lower body dressing/undressing steps patient completed: Thread/unthread right underwear leg;Thread/unthread left underwear leg;Pull underwear up/down;Thread/unthread right pants leg;Thread/unthread left pants leg;Pull pants up/down Lower body dressing/undressing: 4: Min-Patient completed 75 plus % of tasks  FIM - Toileting Toileting steps completed by patient: Adjust clothing prior to toileting;Performs perineal hygiene;Adjust clothing after toileting Toileting: 5: Supervision: Safety issues/verbal cues  FIM - Diplomatic Services operational officer Devices: Bedside commode Toilet Transfers: 5-To toilet/BSC: Supervision (verbal cues/safety issues);5-From toilet/BSC: Supervision (verbal cues/safety issues)  FIM - Bed/Chair Transfer Bed/Chair Transfer Assistive Devices: Bed rails;Prosthesis;Walker Bed/Chair  Transfer: 5: Bed > Chair or W/C: Supervision (verbal cues/safety issues);5: Chair or W/C > Bed: Supervision (verbal cues/safety  issues)  FIM - Locomotion: Wheelchair Distance: 150 Locomotion: Wheelchair: 5: Travels 150 ft or more: maneuvers on rugs and over door sills with supervision, cueing or coaxing FIM - Locomotion: Ambulation Locomotion: Ambulation Assistive Devices: Designer, industrial/productWalker - Rolling Ambulation/Gait Assistance: 5: Supervision Locomotion: Ambulation: 5: Travels 150 ft or more with supervision/safety issues  Comprehension Comprehension Mode: Auditory Comprehension: 6-Follows complex conversation/direction: With extra time/assistive device  Expression Expression Mode: Verbal Expression: 6-Expresses complex ideas: With extra time/assistive device  Social Interaction Social Interaction: 6-Interacts appropriately with others with medication or extra time (anti-anxiety, antidepressant).  Problem Solving Problem Solving: 6-Solves complex problems: With extra time  Memory Memory: 6-More than reasonable amt of time  Medical Problem List and Plan:  Deconditioning secondary to cardiac arrest in a patient with end-stage renal disease  1. DVT Prophylaxis/Anticoagulation: Mechanical: Sequential compression devices, below knee Bilateral lower extremities and increase mobility  2. Pain Management: Tramadol prn and tylenol tid for pain.  3. Mood: pt is in good spirits, team to provide egosupport  4. Neuropsych: This patient is capable of making decisions on her own behalf.  5. CAD with h/o recurrent PEA: Has refused further work up. Continue tramadol for chest wall pain due to CPR.  6. Recurrent GIB: Continue PPI bid and pepcid at HS.  7. ESRD: HD ongoing M, W, F. Able to maintain BP with proamatine prior to procedure and other adjustments to HD process.   daily weights. Modified diet with FR.   -has had cardiac arrests on HD--no problems since coming to rehab  -team working on options  moving forward 8. L-ICA stenosis: To follow up with Dr. Corliss Skainseveshwar after discharge.  9. 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   11. Acute on chronic anemia: Monitor with routine checks and transfuse prn. Continue aranesp.  12.DM type 2: Will monitor with ac/hs checks. Continue SSI for now. Used amaryl 4 mg at home- on 1mg  daily currently   -good control    LOS (Days) 8 A FACE TO FACE EVALUATION WAS PERFORMED  Erick ColaceKIRSTEINS,ANDREW E 10/23/2013 7:14 AM

## 2013-10-23 NOTE — Progress Notes (Signed)
Physical Therapy Session Note  Patient Details  Name: Briana Gould MRN: [id number] Date of Birth: [birth year]  Today's Date: 10/23/2013 Time: 1400-1505 Time Calculation (min): 65 min   Skilled Therapeutic Interventions/Progress Updates:    Pt participated in 5 stations during Stride Right Walking Group Therapy Session:  1] Obstacle Course: navigating cones, stepping over obstacles, stairs x 2 with bil railings, ambulation over compliant surface and controlled tiled surface with RW and overall close supervision assist (min assist for steps). Pt moves at slow speed however endurance is significantly improving.  2] Standing balance on compliant surface with decreased UE support while rolling bowling ball at pins, min assist 3] NuStep level 4 x 10 min (+intermittent rest breaks as needed) 4] Sit<> stands x 10 reps, rest and repeat  5] Seated LE resisted exercises with 4# ankle weights: marching and long arc quads (Rt LE only) 10 reps each, repeated until rotation switch.   Therapy Documentation Precautions:  Precautions Precautions: None Required Braces or Orthoses: Other Brace/Splint Other Brace/Splint: BKA- RLE with prosthesis. Pt able to donn herself.  Restrictions Weight Bearing Restrictions: No Pain:  no c/o  See FIM for current functional status  Therapy/Group: Group Therapy  [name], Jamarie [place] Cheek 10/23/2013, 3:51 PM

## 2013-10-23 NOTE — Progress Notes (Signed)
Subjective:  No complaints, improving with rehab  Objective: Vital signs in last 24 hours: Temp:  [98.1 F (36.7 C)-98.9 F (37.2 C)] 98.9 F (37.2 C) (02/14 0534) Pulse Rate:  [71-82] 82 (02/14 0534) Resp:  [18-20] 20 (02/14 0534) BP: (123-194)/(58-87) 152/87 mmHg (02/14 0534) SpO2:  [99 %-100 %] 100 % (02/14 0534) Weight:  [78.9 kg (173 lb 15.1 oz)-81.9 kg (180 lb 8.9 oz)] 79.2 kg (174 lb 9.7 oz) (02/14 0534) Weight change: 3.7 kg (8 lb 2.5 oz)  Intake/Output from previous day: 02/13 0701 - 02/14 0700 In: 840 [P.O.:840] Out: 2016    Lab Results:  Recent Labs  10/20/13 1730 10/22/13 1426  WBC 9.1 8.9  HGB 10.3* 10.5*  HCT 31.6* 33.7*  PLT 230 213   BMET:  Recent Labs  10/20/13 1730 10/22/13 1426  NA 136* 138  K 4.6 4.5  CL 93* 95*  CO2 25 25  GLUCOSE 103* 166*  BUN 37* 26*  CREATININE 6.95* 6.14*  CALCIUM 10.6* 10.5  ALBUMIN 3.1* 3.1*   No results found for this basename: PTH,  in the last 72 hours Iron Studies: No results found for this basename: IRON, TIBC, TRANSFERRIN, FERRITIN,  in the last 72 hours  EXAM:  General appearance: Alert, in no apparent distress  Resp: CTA without rales, rhonchi, or wheezes  Cardio: RRR with Gr II/VI systolic murmur, no rub  GI: + BS, soft and nontender  Extremities: L BKA, R lower leg wrapped in ACE  Access: AVF @ LUA with + bruit   Dialysis: MWF [facility]  4.5h EDW was 79kg (subtract 2.4kg for prosthesis) Prof 2 No heparin  Hect 8ug EPO 20k.   Assessment/Plan: 1. Deconditioning - Improving with inpatient rehab, possibly through 2/17.  2. 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 Bradycardic with some of the episodes, cardiology felt pt not good candidate for PPM due to ESRD status, risk of infection.  3. Severe L carotid stenosis - procedure originally postponed due to GI bleeding on antiplatelet agents, ultimately pt decided not to pursue the procedure further, manage  medically  4. Recurrent GIB - on bid PPI and Pepcid qhs.  5. ESRD - HD on MWF @ [facility], K 4.5.  Next HD 2/16. Keep SBP over 125 with HD. Consider 4x per wk HD as outpt. 6. HTN/Volume - BP 152/87, maintain BP > 125, Midodrine pre HD; wt 79.2 kg s/p net UF 2 L yesterday; Standing wgts with prosthetic then subtract 2.4 kg for goal. 7. Anemia - Hgb 10.5 on Aranesp 200 mcg on Wed.  8. Sec HPT - Ca 10.5 (11.2 corrected) on 2/11, P 4; 2Ca bath, Hectorol 8 mcg on hold sec to high Ca, Renvela 2 with meals.  9. PAD - s/p L BKA, RLE with edema and ischemic changes. Will watch closely.     LOS: 8 days   LYLES,CHARLES 10/23/2013,9:53 AM  I have seen and examined patient, discussed with PA and agree with assessment and plan as outlined above with additions as indicated. Vinson Moselleob Mery Guadalupe MD pager [phone]    cell [phone] 10/23/2013, 11:45 AM

## 2013-10-24 LAB — GLUCOSE, CAPILLARY
GLUCOSE-CAPILLARY: 117 mg/dL — AB (ref 70–99)
Glucose-Capillary: 180 mg/dL — ABNORMAL HIGH (ref 70–99)
Glucose-Capillary: 82 mg/dL (ref 70–99)
Glucose-Capillary: 128 mg/dL — ABNORMAL HIGH (ref 70–99)

## 2013-10-24 NOTE — Progress Notes (Signed)
Subjective/Complaints: Sitting EOB, appreciate Nephro note.  Pt concerned about snow forecast for Tues A 12 point review of systems has been performed and if not noted above is otherwise negative.   Objective: Vital Signs: Blood pressure 142/53, pulse 76, temperature 98.5 F (36.9 C), temperature source Oral, resp. rate 18, height 5\' 6"  (1.676 m), weight 74.2 kg (163 lb 9.3 oz), SpO2 100.00%. No results found.  Recent Labs  10/22/13 1426  WBC 8.9  HGB 10.5*  HCT 33.7*  PLT 213    Recent Labs  10/22/13 1426  NA 138  K 4.5  CL 95*  GLUCOSE 166*  BUN 26*  CREATININE 6.14*  CALCIUM 10.5   CBG (last 3)   Recent Labs  10/23/13 1620 10/23/13 2046 10/24/13 0707  GLUCAP 110* 162* 117*    Wt Readings from Last 3 Encounters:  10/24/13 74.2 kg (163 lb 9.3 oz)  10/15/13 81.1 kg (178 lb 12.7 oz)  10/15/13 81.1 kg (178 lb 12.7 oz)    Physical Exam:   General: Alert and oriented x 3, No apparent distress. obese HEENT: Head is normocephalic, atraumatic, PERRLA, EOMI, sclera anicteric, oral mucosa pink and moist, dentition intact, ext ear canals clear,  Neck: Supple without JVD or lymphadenopathy Heart: Reg rate and rhythm. With SEM. no rubs or gallops Chest: CTA bilaterally without wheezes, rales, or rhonchi; no distress Abdomen: Soft, non-tender, non-distended, bowel sounds positive. Extremities: No clubbing, cyanosis, persistent 1+ LE edema. Pulses are 2+ Skin: Clean and intact without signs of breakdown. Left BK well healed. Neuro: Pt is cognitively appropriate with normal insight, memory, and awareness.  Fine motor coordination is intact. No tremors. Motor function is grossly 5/5. Good trunk control Musculoskeletal: Full ROM, No pain with AROM or PROM in the neck, trunk, or extremities. Posture appropriate Psych: Pt's affect is appropriate. Pt is cooperative    Assessment/Plan: 1. Functional deficits secondary to deconditioning which require 3+ hours  per day of interdisciplinary therapy in a comprehensive inpatient rehab setting. Physiatrist is providing close team supervision and 24 hour management of active medical problems listed below. Physiatrist and rehab team continue to assess barriers to discharge/monitor patient progress toward functional and medical goals. Consider Monday post therapy d/c   FIM: FIM - Bathing Bathing Steps Patient Completed: Chest;Right Arm;Left Arm;Abdomen;Front perineal area;Buttocks;Right upper leg;Left upper leg;Right lower leg (including foot) Bathing: 7: Complete Independence: No helper  FIM - Upper Body Dressing/Undressing Upper body dressing/undressing steps patient completed: Thread/unthread right bra strap;Thread/unthread left bra strap;Hook/unhook bra;Thread/unthread right sleeve of pullover shirt/dresss;Thread/unthread left sleeve of pullover shirt/dress;Put head through opening of pull over shirt/dress;Pull shirt over trunk Upper body dressing/undressing: 6: More than reasonable amount of time FIM - Lower Body Dressing/Undressing Lower body dressing/undressing steps patient completed: Thread/unthread right underwear leg;Thread/unthread left underwear leg;Pull underwear up/down;Thread/unthread right pants leg;Thread/unthread left pants leg;Pull pants up/down Lower body dressing/undressing: 4: Min-Patient completed 75 plus % of tasks  FIM - Toileting Toileting steps completed by patient: Adjust clothing prior to toileting;Performs perineal hygiene;Adjust clothing after toileting Toileting: 5: Supervision: Safety issues/verbal cues  FIM - Diplomatic Services operational officerToilet Transfers Toilet Transfers Assistive Devices: Bedside commode Toilet Transfers: 5-To toilet/BSC: Supervision (verbal cues/safety issues);5-From toilet/BSC: Supervision (verbal cues/safety issues)  FIM - Press photographerBed/Chair Transfer Bed/Chair Transfer Assistive Devices: Prosthesis;Arm rests Bed/Chair Transfer: 5: Bed > Chair or W/C: Supervision (verbal cues/safety  issues);5: Chair or W/C > Bed: Supervision (verbal cues/safety issues)  FIM - Locomotion: Wheelchair Distance: 150 Locomotion: Wheelchair: 5: Travels 150  ft or more: maneuvers on rugs and over door sills with supervision, cueing or coaxing FIM - Locomotion: Ambulation Locomotion: Ambulation Assistive Devices: Walker - Rolling Ambulation/Gait Assistance: 5: Supervision Locomotion: Ambulation: 2: Travels 50 - 149 ft with supervision/safety issues  Comprehension Comprehension Mode: Auditory Comprehension: 6-Follows complex conversation/direction: With extra time/assistive device  Expression Expression Mode: Verbal Expression: 6-Expresses complex ideas: With extra time/assistive device  Social Interaction Social Interaction: 6-Interacts appropriately with others with medication or extra time (anti-anxiety, antidepressant).  Problem Solving Problem Solving: 6-Solves complex problems: With extra time  Memory Memory: 6-More than reasonable amt of time  Medical Problem List and Plan:  Deconditioning secondary to cardiac arrest in a patient with end-stage renal disease  1. DVT Prophylaxis/Anticoagulation: Mechanical: Sequential compression devices, below knee Bilateral lower extremities and increase mobility  2. Pain Management: Tramadol prn and tylenol tid for pain.  3. Mood: pt is in good spirits, team to provide egosupport  4. Neuropsych: This patient is capable of making decisions on her own behalf.  5. CAD with h/o recurrent PEA: Has refused further work up. Continue tramadol for chest wall pain due to CPR.  6. Recurrent GIB: Continue PPI bid and pepcid at HS.  7. ESRD: HD ongoing M, W, F. Able to maintain BP with proamatine prior to procedure and other adjustments to HD process.   daily weights. Modified diet with FR.   -has had cardiac arrests on HD--no problems since coming to rehab  -team working on options moving forward 8. L-ICA stenosis: To follow up with Dr. Corliss Skains after  discharge.  9. 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   11. Acute on chronic anemia: Monitor with routine checks and transfuse prn. Continue aranesp.  12.DM type 2: Will monitor with ac/hs checks. Continue SSI for now. Used amaryl 4 mg at home- on 1mg  daily currently   -good control    LOS (Days) 9 A FACE TO FACE EVALUATION WAS PERFORMED  Erick Colace 10/24/2013 8:56 AM

## 2013-10-24 NOTE — Progress Notes (Signed)
Subjective:   No complaints, feeling well, improving with rehab  Objective: Vital signs in last 24 hours: Temp:  [98.1 F (36.7 C)-98.5 F (36.9 C)] 98.5 F (36.9 C) (02/15 0556) Pulse Rate:  [76-80] 76 (02/15 0556) Resp:  [18-20] 18 (02/15 0556) BP: (140-142)/(53-77) 142/53 mmHg (02/15 0556) SpO2:  [97 %-100 %] 100 % (02/15 0556) Weight:  [74.2 kg (163 lb 9.3 oz)-80 kg (176 lb 5.9 oz)] 74.2 kg (163 lb 9.3 oz) (02/15 0556) Weight change: -1.9 kg (-4 lb 3 oz)  Intake/Output from previous day: 02/14 0701 - 02/15 0700 In: 720 [P.O.:720] Out: -    Lab Results:  Recent Labs  10/22/13 1426  WBC 8.9  HGB 10.5*  HCT 33.7*  PLT 213   BMET:  Recent Labs  10/22/13 1426  NA 138  K 4.5  CL 95*  CO2 25  GLUCOSE 166*  BUN 26*  CREATININE 6.14*  CALCIUM 10.5  ALBUMIN 3.1*   No results found for this basename: PTH,  in the last 72 hours Iron Studies: No results found for this basename: IRON, TIBC, TRANSFERRIN, FERRITIN,  in the last 72 hours   EXAM:  General appearance: Alert, in no apparent distress  Resp: CTA without rales, rhonchi, or wheezes  Cardio: RRR with Gr II/VI systolic murmur, no rub  GI: + BS, soft and nontender  Extremities: L BKA, R lower leg wrapped in ACE  Access: AVF @ LUA with + bruit   Dialysis: MWF [facility]  4.5h EDW was 79kg (subtract 2.4kg for prosthesis) Prof 2 No heparin  Hect 8ug EPO 20k.   Assessment/Plan: 1. Deconditioning - Improving with inpatient rehab, possibly through 2/17.  2. 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  3. Severe L carotid stenosis - procedure originally postponed due to GI bleeding on antiplatelet agents, ultimately pt decided not to pursue the procedure further, manage medically.  4. Recurrent GIB - on bid PPI and Pepcid qhs.  5. ESRD - HD on MWF @  [facility], K 4.5. Next HD tomorrow. 6. HTN/Volume - BP 142/53, maintain BP > 125 with HD, Midodrine pre HD; wt 74.2 kg; standing wgts with prosthetic then subtract 2.4 kg for goal. Establish new EDW. 7. Anemia - Hgb 10.5 on Aranesp 200 mcg on Wed.  8. Sec HPT - Ca 10.5 (11.2 corrected) on 2/11, P 4; 2Ca bath, Hectorol 8 mcg on hold sec to high Ca, Renvela 2 with meals.  9. PAD - s/p L BKA, RLE with edema and ischemic changes. Will watch closely.     LOS: 9 days   LYLES,CHARLES 10/24/2013,8:10 AM  I have seen and examined patient, discussed with PA and agree with assessment and plan as outlined above. Vinson Moselleob Finnean Cerami MD pager [phone]    cell [phone] 10/24/2013, 1:01 PM

## 2013-10-25 ENCOUNTER — Inpatient Hospital Stay (HOSPITAL_COMMUNITY): Payer: Medicare Other

## 2013-10-25 ENCOUNTER — Inpatient Hospital Stay (HOSPITAL_COMMUNITY): Payer: Medicare Other | Admitting: Physical Therapy

## 2013-10-25 DIAGNOSIS — I2510 Atherosclerotic heart disease of native coronary artery without angina pectoris: Secondary | ICD-10-CM

## 2013-10-25 LAB — CBC
HCT: 32.3 % — ABNORMAL LOW (ref 36.0–46.0)
Hemoglobin: 10.4 g/dL — ABNORMAL LOW (ref 12.0–15.0)
MCH: 31.5 pg (ref 26.0–34.0)
MCHC: 32.2 g/dL (ref 30.0–36.0)
MCV: 97.9 fL (ref 78.0–100.0)
PLATELETS: 234 10*3/uL (ref 150–400)
RBC: 3.3 MIL/uL — ABNORMAL LOW (ref 3.87–5.11)
RDW: 17.4 % — ABNORMAL HIGH (ref 11.5–15.5)
WBC: 9 10*3/uL (ref 4.0–10.5)

## 2013-10-25 LAB — RENAL FUNCTION PANEL
Albumin: 3.3 g/dL — ABNORMAL LOW (ref 3.5–5.2)
BUN: 36 mg/dL — ABNORMAL HIGH (ref 6–23)
CO2: 24 meq/L (ref 19–32)
Calcium: 10.6 mg/dL — ABNORMAL HIGH (ref 8.4–10.5)
Chloride: 93 meq/L — ABNORMAL LOW (ref 96–112)
Creatinine, Ser: 7.54 mg/dL — ABNORMAL HIGH (ref 0.50–1.10)
GFR calc Af Amer: 6 mL/min — ABNORMAL LOW
GFR calc non Af Amer: 5 mL/min — ABNORMAL LOW
Glucose, Bld: 122 mg/dL — ABNORMAL HIGH (ref 70–99)
Phosphorus: 3.9 mg/dL (ref 2.3–4.6)
Potassium: 4.3 meq/L (ref 3.7–5.3)
Sodium: 135 meq/L — ABNORMAL LOW (ref 137–147)

## 2013-10-25 LAB — GLUCOSE, CAPILLARY
Glucose-Capillary: 97 mg/dL (ref 70–99)
Glucose-Capillary: 97 mg/dL (ref 70–99)

## 2013-10-25 MED ORDER — FAMOTIDINE 20 MG PO TABS: 20.0000 mg | ORAL_TABLET | Freq: Every day | ORAL | Status: DC

## 2013-10-25 MED ORDER — MIDODRINE HCL 5 MG PO TABS: 5.0000 mg | ORAL_TABLET | ORAL | Status: DC

## 2013-10-25 MED ORDER — PANTOPRAZOLE SODIUM 40 MG PO TBEC: 40.0000 mg | DELAYED_RELEASE_TABLET | Freq: Two times a day (BID) | ORAL | Status: DC

## 2013-10-25 MED ORDER — GLIMEPIRIDE 1 MG PO TABS: 1.0000 mg | ORAL_TABLET | Freq: Every day | ORAL | Status: DC

## 2013-10-25 NOTE — Progress Notes (Signed)
Occupational Therapy Discharge Summary  Patient Details  Name: Briana Gould MRN: [id number] Date of Birth: [birth year]  Today's Date: 10/26/2013  Patient has met 5 of 8 long term goals due to improved activity tolerance and ability to compensate for deficits.  Patient to discharge at overall Supervision - [place] (for lower body dressing) level owing to patient's request for early discharge due to inclement weather .  Patient's care partner is independent to provide the necessary physical and cognitive assistance at discharge to complete care of right foot and supervise tub transfers.    Reasons goals not met: n/a  Recommendation:  Patient will benefit from ongoing skilled OT services in home health setting to continue to advance functional skills in the area of BADL.  Equipment: No equipment provided  Reasons for discharge: discharge from hospital  Patient/family agrees with progress made and goals achieved: Yes  OT Discharge Precautions/Restrictions  Precautions Precautions: None Other Brace/Splint: BKA- RLE with prosthesis. Pt able to donn herself.  Restrictions Weight Bearing Restrictions: No  Pain Pain Assessment Pain Assessment: No/denies pain  ADL ADL ADL Comments: see FIM  Vision/Perception  Vision - History Baseline Vision: Bifocals Visual History: Cataracts Patient Visual Report: No change from baseline Vision - Assessment Eye Alignment: Within Functional Limits Perception Perception: Within Functional Limits Praxis Praxis: Intact   Cognition Overall Cognitive Status: Within Functional Limits for tasks assessed Arousal/Alertness: Awake/alert Orientation Level: Oriented X4 Attention: Alternating Focused Attention: Appears intact Alternating Attention: Appears intact Memory: Appears intact Awareness: Appears intact Problem Solving: Appears intact Safety/Judgment: Appears intact  Sensation Sensation Light Touch: Appears  Intact Stereognosis: Appears Intact Hot/Cold: Appears Intact Proprioception: Appears Intact Coordination Gross Motor Movements are Fluid and Coordinated: Yes Fine Motor Movements are Fluid and Coordinated: Yes  Motor  Motor Motor: Within Functional Limits Motor - Discharge Observations: Generalized weakness/deconditioning  Mobility  Bed Mobility Bed Mobility: Supine to Sit;Sit to Supine Rolling Right: 6: Modified independent (Device/Increase time) Rolling Right Details: Verbal cues for technique Supine to Sit: 6: Modified independent (Device/Increase time) Sit to Supine: 6: Modified independent (Device/Increase time) Transfers Transfers: Sit to Stand;Stand to Sit Sit to Stand: 6: Modified independent (Device/Increase time) Stand to Sit: 6: Modified independent (Device/Increase time)   Trunk/Postural Assessment  Cervical Assessment Cervical Assessment: Within Functional Limits Thoracic Assessment Thoracic Assessment: Within Functional Limits Lumbar Assessment Lumbar Assessment: Within Functional Limits Postural Control Postural Control: Within Functional Limits   Balance Balance Balance Assessed: Yes Static Standing Balance Static Standing - Level of Assistance: 6: Modified independent (Device/Increase time)  Extremity/Trunk Assessment RUE Assessment RUE Assessment: Within Functional Limits RUE AROM (degrees) Overall AROM Right Upper Extremity: Within functional limits for tasks performed RUE Strength RUE Overall Strength: Within Functional Limits for tasks performed RUE Tone RUE Tone: Within Functional Limits LUE Assessment LUE Assessment: Within Functional Limits LUE AROM (degrees) Overall AROM Left Upper Extremity: Within functional limits for tasks assessed LUE Strength LUE Overall Strength: Within Functional Limits for tasks assessed LUE Tone LUE Tone: Within Functional Limits  See FIM for current functional status  Franck Vinal 10/26/2013, 11:38  AM

## 2013-10-25 NOTE — Progress Notes (Signed)
Pt discharged to home accompanied by her daughter 

## 2013-10-25 NOTE — Progress Notes (Signed)
Social Work  Discharge Note  The overall goal for the admission was met for:   Discharge location: Yes - home with family providing any needed assist  Length of Stay: Yes - 10 days  Discharge activity level: Yes - modified independent to supervision  Home/community participation: Yes  Services provided included: MD, RD, PT, OT, RN, TR, Pharmacy and SW  Financial Services: Medicare and Private Insurance: Building control surveyor, [place]  Follow-up services arranged: Home Health: RN, PT via [place], DME: rolling walker via [place] and Patient/Family has no preference for HH/DME agencies  Comments (or additional information):  Patient/Family verbalized understanding of follow-up arrangements: Yes  Individual responsible for coordination of the follow-up plan: patient  Confirmed correct DME delivered: Jaymen Fetch 10/25/2013    Shabre Kreher

## 2013-10-25 NOTE — Procedures (Signed)
Tolerating hemodialysis at this time, hemodynamically stable.  I discussed with patient at length the uncertainty of outcomes on dialysis at the kidney center and the inability for staff to prevent another arrest.  There is no real predictability of another event based on BP or pulse, and therefore no realistic way to anticipate another event.  I told her I could not see a cause and effect re: the arrest and dialysis given the occurrence 8 minutes into the hemodialysis treatment. She will need to limit her fluid intake and we will limit fluid removal.  This will help with BP fluctuations but may not prevent an untoward event.  I told her her refusal to allow further diagnostic studies has hampered our ability to help her and she understands this.  I also told her that the outpt and inpt dialysis situations are completely different  and expectations regarding out pt treatment should not change, but we will try to make some modifications to be more attentive to her but cannot replicate the in pt setting.  I called the daughter,  [name] but no answer. Deshondra Worst C   Assessment/Plan:  1. Deconditioning - Improving with inpatient rehab, possibly through 2/17.  2. 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  3. Severe L carotid stenosis - procedure originally postponed due to GI bleeding on antiplatelet agents, ultimately pt decided not to pursue the procedure further, manage medically.  4. Recurrent GIB - on bid PPI and Pepcid qhs.  5. ESRD - HD on MWF @ [facility], K 4.5. Next HD tomorrow. 6. HTN/Volume - BP 142/53, maintain BP > 125 with HD, Midodrine pre HD; wt 74.2 kg; standing wgts with prosthetic then subtract 2.4 kg for goal. Establish new EDW. 7. Anemia - Hgb 10.5 on Aranesp 200 mcg on Wed.  8. Sec HPT - Ca 10.5 (11.2 corrected) on 2/11, P 4;  2Ca bath, Hectorol 8 mcg on hold sec to high Ca, Renvela 2 with meals.  9. PAD - s/p L BKA, RLE with edema and ischemic changes. Will watch closely.

## 2013-10-25 NOTE — Progress Notes (Addendum)
[name], female   DOB: 09-Sep-1946, 68 y.o.   MRN: [id number]    Asked to see patient regarding heart catheterization:  Debilitated 68 yo dialysis patient Seen by Dr Excell Seltzerooper 09/20/13. Presented on 09/20/2013 with cardiac arrest. She was undergoing hemodialysis this morning and she became unresponsive. She was found to have PEA arrest. CPR was performed for 20 minutes. No medications were administered. She had return of spontaneous circulation with resuscitative efforts.  She has had syncope and collapse in the past and was just hospitalized in December 2014. At that time she had marked bradycardia and required dopamine. She was seen by our electrophysiology service and thought to be a very poor candidate for permanent pacemaker. This was in the setting of end-stage renal disease, right internal jugular vein DVT, left arm AV fistula, and subclavian catheter for dialysis. After several days off of her beta blocker, she was able to be weaned from dopamine and had no further issues. Her course 3 weeks ago was remarkable for stable hemodynamics , no heart block, R/O and no arrhythmias while intubated. At that time no cath planned as etiology of her event seemed more from cerebral hypoperfusion. There has been talk of performing stenting. She has had 5 weeks of dialysis without incident for full 4 hours. Renal has wanted to explore peritoneal dialysis to minimize hemodynamic swings that may ppt events. Cannot find consult note but short note from Dr Dwain SarnaWakefield indicates that he would not do presumed surgery to place peritoneal catheter   Patient has had no chest pain. Reviewed her cath from 08/31/2010 Done by Dr Teressa LowerBensimohn Diffuse disease in distal RCA/PDA Distal circumflex and diagonals. 40% calcified mid LAD. Not amenable to PCI/CABG   She has had a GI bleed.  Plavix was not effective with high P2Y when considering neuro intervention but bled on Brillinta She bled down to a Hct of 22.8 and EGD  showed gastric/duodenol ulcers with esophagitis.  Colonoscopy never done   Objective:  Filed Vitals:   10/25/13 1130 10/25/13 1152 10/25/13 1220 10/25/13 1300  BP: 156/70 158/73 178/85 162/77  Pulse: 77 77 82 80  Temp:    98 F (36.7 C)  TempSrc:    Oral  Resp:    17  Height:      Weight:    175 lb 4.3 oz (79.5 kg)  SpO2:    100%    Intake/Output from previous day:  Intake/Output Summary (Last 24 hours) at 10/25/13 1748 Last data filed at 10/25/13 1245  Gross per 24 hour  Intake    240 ml  Output   2001 ml  Net  -1761 ml    Physical Exam: Affect appropriate Chronically ill black female  HEENT: normal Neck supple with no adenopathy JVP normal no bruits no thyromegaly Lungs clear with no wheezing and good diaphragmatic motion Heart:  S1/S2 SEM  murmur, no rub, gallop or click PMI normal Abdomen: benighn, BS positve, no tenderness, no AAA no bruit.  No HSM or HJR Right below knee amputation Plus one RLE  edema Neuro non-focal Skin warm and dry Fistula RUE with good thrill    Lab Results: Basic Metabolic Panel:  Recent Labs  46/96/2902/16/15 0800  NA 135*  K 4.3  CL 93*  CO2 24  GLUCOSE 122*  BUN 36*  CREATININE 7.54*  CALCIUM 10.6*  PHOS 3.9   Liver Function Tests:  Recent Labs  10/25/13 0800  ALBUMIN 3.3*   CBC:  Recent Labs  10/25/13  0800  WBC 9.0  HGB 10.4*  HCT 32.3*  MCV 97.9  PLT 234    Imaging: No results found.  Cardiac Studies:  ECG:  SR LBBB    Echo: - Left ventricle: Overall image quality very poor but no discreet RWMA seen The cavity size was normal. Wall thickness was increased in a pattern of moderate LVH. Systolic function was normal. The estimated ejection fraction was in the range of 50% to 55%. - Aortic valve: No good CW doppler may have mild AS but certainly not severe. - Mitral valve: Moderately calcified, moderately thickened annulus. Mild regurgitation. - Left atrium: The atrium was mildly dilated. - Right  ventricle: The cavity size was mildly dilated. Wall thickness was normal. - Right atrium: The atrium was mildly dilated. - Tricuspid valve: Moderate regurgitation. - Pulmonary arteries: PA peak pressure: 69mm Hg (S). - Impressions: PA pressures elevated by TR velocity Impressions:  - PA pressures elevated by TR velocity   Medications:   . darbepoetin (ARANESP) injection - DIALYSIS  200 mcg Intravenous Q Wed-HD  . docusate sodium  100 mg Oral BID  . feeding supplement (NEPRO CARB STEADY)  237 mL Oral QPC supper  . glimepiride  1 mg Oral Q breakfast  . insulin aspart  0-9 Units Subcutaneous TID WC  . midodrine  5 mg Oral Q M,W,F-HD  . multivitamin  1 tablet Oral QHS  . pantoprazole  40 mg Oral BID  . sevelamer carbonate  1,600 mg Oral TID WC  . sodium chloride  3 mL Intravenous Q12H     . sodium chloride      Assessment/Plan:  CAD:  Long discussion with patient and daughter.  She had refused heart cath prior to admission to rehab.  She is doing well now.  Discussed risks of heart cath and possibility of LM or large vessel proximal disease.  Unfortunately we would have to do diagnositic  Study and if intervention possible get GI involved to rescope  Starting Effient or Brillinta could ppt a recurrent GI bleed and she is not A candidate for CABG if LM disease found.  Given that fact that she is doing well and wants to go home for a time we will schedule f/u wth Dr Excell Seltzer in next 2-3 weeks and consider cath in future.  They are comfortable and favor this approach as less risky than forging ahead at this point.  She will be d/c from rehab tonight.  She will continue with outpatient dialysis.  She will continue with pantoprazole bid  Charlton Haws 10/25/2013, 5:48 PM

## 2013-10-25 NOTE — Progress Notes (Signed)
Physical Therapy Discharge Summary  Patient Details  Name: Briana Gould MRN: [id number] Date of Birth: 07/09/1946  Today's Date: 10/25/2013 Time: 4680-3212 Time Calculation (min): 35 min  Patient has met 2 of 8 long term goals due to improved activity tolerance, improved balance and increased strength.  Patient to discharge at an ambulatory level Supervision.   Patient's care partner is independent to provide the necessary supervision assistance at discharge.  Reasons goals not met: Pt did not reach mod I for dynamic standing balance, gait goals (controlled, home or community) or transfer goals of mod I.  Pt scheduled to D/C on Thursday but chose to D/C early due to inclement weather.  Secondary to shortened LOS pt did not reach mod I level.   Recommendation:  Patient will benefit from ongoing skilled PT services in home health setting to continue to advance safe functional mobility, address ongoing impairments in bilat LE weakness, impaired activity tolerance/endurance, gait, balance, and minimize fall risk.  Equipment: RW  Reasons for discharge: discharge from hospital  Patient/family agrees with progress made and goals achieved: Yes  PT Discharge Vital Signs Therapy Vitals Temp: 98 F (36.7 C) Temp src: Oral Pulse Rate: 80 Resp: 17 BP: 162/77 mmHg Patient Position, if appropriate: Lying Oxygen Therapy SpO2: 100 % Pain Pain Assessment Pain Assessment: No/denies pain Cognition Orientation Level: Oriented X4 Sensation Sensation Light Touch: Appears Intact Stereognosis: Not tested Hot/Cold: Not tested Proprioception: Appears Intact Coordination Gross Motor Movements are Fluid and Coordinated: Yes Fine Motor Movements are Fluid and Coordinated: Yes Motor  Motor Motor - Discharge Observations: Generalized weakness/deconditioning  Mobility Bed Mobility Bed Mobility: Supine to Sit;Sit to Supine Supine to Sit: 6: Modified independent (Device/Increase time) Sit  to Supine: 6: Modified independent (Device/Increase time) Sit to Supine - Details (indicate cue type and reason): Able to perform supine <> sit mod I when prosthesis doffed; when performed on flat mat with prosthesis donned required min A to place LLE onto bed and position for scooting in bed; pt reports she usually pushes through end of residual limb.  Transfers Stand Pivot Transfers: 5: Supervision;With armrests Stand Pivot Transfer Details (indicate cue type and reason): Stand pivot with RW with supervision; requires cues for safe set up of w/c prior to standing.  Also performed stand pivot w/c <> simulated tall SUV with RW and supervision.  Pt able to bring LE into and out of car safely.  Locomotion  Ambulation Ambulation/Gait Assistance: 5: Supervision Ambulation Distance (Feet): 25 Feet Assistive device: Rolling walker Ambulation/Gait Assistance Details:    Stairs / Additional Locomotion Stairs: No Ramp: 5: Supervision (performed gait with RW up/down ramp to simulate how pt will enter her home.  Required supervision for safety on incline Wheelchair Mobility Wheelchair Mobility: Yes Wheelchair Assistance: 5: Supervision Wheelchair Propulsion: Both upper extremities Wheelchair Parts Management: Supervision/cueing Distance: 150; with cues for propulsion sequence to maintain straight navigation in hallway; pt noted to be veering to L and running into wall on L   Balance Static Sitting Balance Static Sitting - Balance Support: No upper extremity supported Static Sitting - Level of Assistance: 6: Modified independent (Device/Increase time) Dynamic Sitting Balance Dynamic Sitting - Balance Support: No upper extremity supported Dynamic Sitting - Level of Assistance: 6: Modified independent (Device/Increase time) Static Standing Balance Static Standing - Balance Support: Bilateral upper extremity supported Static Standing - Level of Assistance: 6: Modified independent (Device/Increase  time) Dynamic Standing Balance Dynamic Standing - Balance Support: Bilateral upper extremity supported Dynamic Standing - Level of  Assistance: 5: Stand by assistance Extremity Assessment  RLE Assessment RLE Assessment: Exceptions to [facility] RLE Strength RLE Overall Strength: Deficits RLE Overall Strength Comments: 4/5 overall except hip flexion 3/5 LLE Assessment LLE Assessment: Exceptions to [facility] LLE Strength LLE Overall Strength: Deficits LLE Overall Strength Comments: hip flexion 3/5 with prosthesis donned, knee extension/flexion 4/5  See FIM for current functional status  Raylene Everts [facility] 10/25/2013, 4:55 PM

## 2013-10-25 NOTE — Progress Notes (Signed)
Subjective/Complaints: No complaints. Would like to go today if possible given incoming weather.  A 12 point review of systems has been performed and if not noted above is otherwise negative.   Objective: Vital Signs: Blood pressure 144/68, pulse 76, temperature 97.7 F (36.5 C), temperature source Oral, resp. rate 16, height 5\' 6"  (1.676 m), weight 81.5 kg (179 lb 10.8 oz), SpO2 100.00%. No results found.  Recent Labs  10/22/13 1426  WBC 8.9  HGB 10.5*  HCT 33.7*  PLT 213    Recent Labs  10/22/13 1426  NA 138  K 4.5  CL 95*  GLUCOSE 166*  BUN 26*  CREATININE 6.14*  CALCIUM 10.5   CBG (last 3)   Recent Labs  10/24/13 1624 10/24/13 2037 10/25/13 0718  GLUCAP 82 128* 97    Wt Readings from Last 3 Encounters:  10/25/13 81.5 kg (179 lb 10.8 oz)  10/15/13 81.1 kg (178 lb 12.7 oz)  10/15/13 81.1 kg (178 lb 12.7 oz)    Physical Exam:   General: Alert and oriented x 3, No apparent distress. obese HEENT: Head is normocephalic, atraumatic, PERRLA, EOMI, sclera anicteric, oral mucosa pink and moist, dentition intact, ext ear canals clear,  Neck: Supple without JVD or lymphadenopathy Heart: Reg rate and rhythm. With SEM. no rubs or gallops Chest: CTA bilaterally without wheezes, rales, or rhonchi; no distress Abdomen: Soft, non-tender, non-distended, bowel sounds positive. Extremities: No clubbing, cyanosis, persistent 1+ LE edema. Pulses are 2+ Skin: Clean and intact without signs of breakdown. Left BK well healed. Neuro: Pt is cognitively appropriate with normal insight, memory, and awareness.  Fine motor coordination is intact. No tremors. Motor function is grossly 5/5. Good trunk control Musculoskeletal: Full ROM, No pain with AROM or PROM in the neck, trunk, or extremities. Posture appropriate Psych: Pt's affect is appropriate. Pt is cooperative    Assessment/Plan: 1. Functional deficits secondary to deconditioning which require 3+ hours per day  of interdisciplinary therapy in a comprehensive inpatient rehab setting. Physiatrist is providing close team supervision and 24 hour management of active medical problems listed below. Physiatrist and rehab team continue to assess barriers to discharge/monitor patient progress toward functional and medical goals.  HD moved up (there now). Will dc home when she completes HD later this morning/early afternoon---daughter is in for ed right now.  FIM: FIM - Bathing Bathing Steps Patient Completed: Chest;Right Arm;Left Arm;Abdomen;Front perineal area;Buttocks;Right upper leg;Left upper leg;Right lower leg (including foot) Bathing: 7: Complete Independence: No helper  FIM - Upper Body Dressing/Undressing Upper body dressing/undressing steps patient completed: Thread/unthread right bra strap;Thread/unthread left bra strap;Hook/unhook bra;Thread/unthread right sleeve of pullover shirt/dresss;Thread/unthread left sleeve of pullover shirt/dress;Put head through opening of pull over shirt/dress;Pull shirt over trunk Upper body dressing/undressing: 7: Complete Independence: No helper FIM - Lower Body Dressing/Undressing Lower body dressing/undressing steps patient completed: Thread/unthread right underwear leg;Thread/unthread left underwear leg;Pull underwear up/down;Thread/unthread right pants leg;Thread/unthread left pants leg;Pull pants up/down;Don/Doff right sock Lower body dressing/undressing: 4: Min-Patient completed 75 plus % of tasks  FIM - Toileting Toileting steps completed by patient: Adjust clothing prior to toileting;Performs perineal hygiene;Adjust clothing after toileting Toileting: 6: More than reasonable amount of time  FIM - Diplomatic Services operational officerToilet Transfers Toilet Transfers Assistive Devices: Environmental consultantWalker;Bedside commode;Prosthesis Toilet Transfers: 6-Assistive device: No helper  FIM - Bed/Chair Transfer Bed/Chair Transfer Assistive Devices: Walker;Prosthesis Bed/Chair Transfer: 6: Assistive device: no  helper  FIM - Locomotion: Wheelchair Distance: 150 Locomotion: Wheelchair: 5: Travels 150 ft or more:  maneuvers on rugs and over door sills with supervision, cueing or coaxing FIM - Locomotion: Ambulation Locomotion: Ambulation Assistive Devices: Walker - Rolling Ambulation/Gait Assistance: 5: Supervision Locomotion: Ambulation: 2: Travels 50 - 149 ft with supervision/safety issues  Comprehension Comprehension Mode: Auditory Comprehension: 6-Follows complex conversation/direction: With extra time/assistive device  Expression Expression Mode: Verbal Expression: 6-Expresses complex ideas: With extra time/assistive device  Social Interaction Social Interaction: 6-Interacts appropriately with others with medication or extra time (anti-anxiety, antidepressant).  Problem Solving Problem Solving: 5-Solves complex 90% of the time/cues < 10% of the time  Memory Memory: 6-More than reasonable amt of time  Medical Problem List and Plan:  Deconditioning secondary to cardiac arrest in a patient with end-stage renal disease  1. DVT Prophylaxis/Anticoagulation: Mechanical: Sequential compression devices, below knee Bilateral lower extremities and increase mobility  2. Pain Management: Tramadol prn and tylenol tid for pain.  3. Mood: pt is in good spirits, team to provide egosupport  4. Neuropsych: This patient is capable of making decisions on her own behalf.  5. CAD with h/o recurrent PEA: Has refused further work up. Continue tramadol for chest wall pain due to CPR.  6. Recurrent GIB: Continue PPI bid and pepcid at HS.  7. ESRD: HD ongoing M, W, F. Able to maintain BP with proamatine prior to procedure and other adjustments to HD process.   daily weights. Modified diet with FR.   -has had cardiac arrests on HD--no problems since coming to rehab    8. L-ICA stenosis: To follow up with Dr. Corliss Skains after discharge.  9. 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   11. Acute on chronic anemia: Monitor with routine checks and transfuse prn. Continue aranesp.  12.DM type 2: Will monitor with ac/hs checks. Continue SSI for now. Used amaryl 4 mg at home- on 1mg  daily currently   -still good control    LOS (Days) 10 A FACE TO FACE EVALUATION WAS PERFORMED  SWARTZ,ZACHARY T 10/25/2013 8:40 AM

## 2013-10-25 NOTE — Progress Notes (Signed)
Occupational Therapy Session Note  Patient Details  Name: Briana Gould MRN: [id number] Date of Birth: 09-08-1946  Today's Date: 10/25/2013 Time: 0727-0757 Time Calculation (min): 30 min  Short Term Goals: Week 2:  OT Short Term Goal 1 (Week 2): STG = LTG due to short length of stay  Skilled Therapeutic Interventions/Progress Updates: ADL-retraining with focus on re-assessment of performance of transfers, bathing and dressing skills, self-feeding and grooming, endurance, and safety.   Family member, daughter Briana Gould, present during session.   Treatment abbreviated by dialysis session scheduled early in prep for an early discharge this date, at patient's request due to potential for inclement weather disrupting discharge on 10/26/13.      Therapy Documentation Precautions:  Precautions Precautions: None Required Braces or Orthoses: Other Brace/Splint Other Brace/Splint: BKA- RLE with prosthesis. Pt able to donn herself.  Restrictions Weight Bearing Restrictions: No  General: General Amount of Missed OT Time (min): 30 Minutes, hemodialysis treatment scheduled early   Vital Signs: Therapy Vitals Temp: 97.7 F (36.5 C) Temp src: Oral Pulse Rate: 75 Resp: 16 BP: 168/72 mmHg Patient Position, if appropriate: Lying Oxygen Therapy SpO2: 100 % O2 Device: None (Room air)  Pain: Pain Assessment Pain Assessment: No/denies pain  See FIM for current functional status  Therapy/Group: Individual Therapy  Second session: Time: 0930 Time Calculation (min):  0 min  Skilled Therapeutic Interventions: No treatment provided d/t dialysis    See FIM for current functional status  Therapy/Group: Individual Therapy  Second session: Time: 1430-1445 Time Calculation (min):  15 min  Pain Assessment: No pain  Skilled Therapeutic Interventions: ADL with focus on family ed and discharge planning.   Patient completed self-feeding unassisted and then presented request for assist with  problem-solving relating to her new plan for cardiac catheterization.  With daughter present for collaborationg, patient elected to pursue catheterization.   Patient was advised to seek contact with acute care admission process, per guidance from MD/PA, as coordination from IP Rehab could not be accomplished at this time.      See FIM for current functional status  Therapy/Group: Individual Therapy  Briana Gould 10/25/2013, 10:08 AM

## 2013-10-25 NOTE — Discharge Instructions (Signed)
Inpatient Rehab Discharge Instructions  Briana Gould Discharge date and time:  10/25/13  Activities/Precautions/ Functional Status: Activity: no lifting, driving, or strenuous exercise for till cleared by MD Diet: renal diet 5 cups fluid/day Wound Care: none needed  Functional status:  ___ No restrictions     ___ Walk up steps independently _X__ 24/7 supervision/assistance   ___ Walk up steps with assistance ___ Intermittent supervision/assistance  ___ Bathe/dress independently ___ Walk with walker       ___ Bathe/dress with assistance ___ Walk Independently    ___ Shower independently ___ Walk with assistance    ___ Shower with assistance _X__ No alcohol     ___ Return to work/school ________     COMMUNITY REFERRALS UPON DISCHARGE:    Home Health:   PT     RN                    Agency: [facility] Phone: [phone]       Special Instructions:    My questions have been answered and I understand these instructions. I will adhere to these goals and the provided educational materials after my discharge from the hospital.  Patient/Caregiver Signature _______________________________ Date __________  Clinician Signature _______________________________________ Date __________  Please bring this form and your medication list with you to all your follow-up doctor's appointments.

## 2013-10-25 NOTE — Progress Notes (Signed)
Recreational Therapy Discharge Summary Patient Details  Name: Briana Gould MRN: [id number] Date of Birth: 04-Feb-1946 Today's Date: 10/25/2013  Long term goals set: 1  Long term goals met: 1  Comments on progress toward goal:  Pt has made great progress toward goal and is ready for discharge home today with family to provide 24 hour supervision.  Pt requires frequent rest breaks due to low activity tolerance.    Reason for discharge:discharge from hospital  Patient/family agrees with progress made and goals achieved: Yes  Briana Gould 10/25/2013, 2:55 PM

## 2013-10-25 NOTE — Progress Notes (Signed)
Patient has decided to have cardiac work up prior to discharge. Cardiology contacted for input this afternoon and awaiting recommendations. Dr. Lowell GuitarPowell called to relay that he'd like patient to stay and have work up due to cardiac events and need for work up. Patient will need transfer to acute for work up as has completed rehab course. Question cardiology v/s TH service--awaiting input by cards.

## 2013-10-25 NOTE — Progress Notes (Signed)
Per MD today, pt cleared for d/c home this afternoon following completion of HD and therapies.  Pt aware and agreeable.  Team aware and agreeable.  Have restarted HH f/u.  Romelo Sciandra, LCSW

## 2013-10-26 NOTE — Discharge Summary (Signed)
Physician Discharge Summary  [name] MRN: [id number] DOB/AGE: 1946/07/08 68 y.o.  Admit date: 10/15/2013 Discharge date: 10/25/2013  Discharge Diagnoses:  Principal Problem:   Physical deconditioning Active Problems:   ESRD (end stage renal disease) on dialysis   CAD (coronary artery disease)   GIB (gastrointestinal bleeding)--recurrent   Anemia of chronic renal failure   Discharged Condition: Stable   Labs:  Basic Metabolic Panel:  Recent Labs Lab 10/20/13 1730 10/22/13 1426 10/25/13 0800  NA 136* 138 135*  K 4.6 4.5 4.3  CL 93* 95* 93*  CO2 25 25 24   GLUCOSE 103* 166* 122*  BUN 37* 26* 36*  CREATININE 6.95* 6.14* 7.54*  CALCIUM 10.6* 10.5 10.6*  PHOS 4.6 4.0 3.9    CBC:  Recent Labs Lab 10/20/13 1730 10/22/13 1426 10/25/13 0800  WBC 9.1 8.9 9.0  HGB 10.3* 10.5* 10.4*  HCT 31.6* 33.7* 32.3*  MCV 97.8 98.5 97.9  PLT 230 213 234    CBG:  Recent Labs Lab 10/24/13 1125 10/24/13 1624 10/24/13 2037 10/25/13 0718 10/25/13 1340  GLUCAP 180* 82 128* 97 97    Brief HPI:   Briana Gould is a 68 y.o. right-handed female with history of diabetes mellitus, coronary artery disease, CVA, L-BKA, ESRD and recent hospitalization for cardiac arrest complicated buy GIB. She was readmitted 09/20/2013 for PEA arrest. Patient did receive CPR x20 minutes. Cranial CT scan showed age uncertain a small infarct mid pons. Small infarct in this area felt to be possibly recent.  Carotid Dopplers 60-79% right ICA stenosis. Patient not felt to be a surgical intervention candidate. 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 60% stenosis of right ICA approximately and 50% stenosis left ICA petrous segment. She was started on antiplatelets in anticipation of stent placement but she developed A Fib with RVR as well as recurrent GIB requiring 2 units PRBC. Cardiac cath recommended by Dr. Eden Emms but patient  declined. Plans for L-ICA stent in the future. Hypotension managed with midodrine support prior to HD. Patient deconditioned and CIR recommended by rehab team.    Hospital Course: Briana Gould was admitted to rehab 10/15/2013 for inpatient therapies to consist of PT, ST and OT at least three hours five days a week. Past admission physiatrist, therapy team and rehab RN have worked together to provide customized collaborative inpatient rehab. HD dialysis has been ongoing on MWF schedule and patient has tolerated this without difficulty. She is being premedicated with proamatine prior to HD sessions. Po intake has been good. H/H has been monitored in HD and is slowly improving. No episodes of recurrent GIB during this stay. BS has been monitored on ac/hs basis and BS have been controlled with CM restrictions. She has had good participation in therapy and endurance has improved. She did express concerns about having recurrrent PEA and was interested in cardiac work up prior to discharge. Dr. Eden Emms was consulted and risks associated with heart cath were discussed. Patient elected on going home to think things over and follow up with cardiology on outpatient basis. She was concerned about  inclement weather and was discharged prior to ELOS and did not reach goals. She is at supervision level overall. She will continue to receive HHPT past discharge.    Rehab course: During patient's stay in rehab weekly team conferences were held to monitor patient's progress, set goals and discuss barriers to discharge. Patient has had improvement in activity tolerance, balance, postural control, as well  as ability to compensate for deficits. She requires supervision for bathing and upper body dressing. She requires min assist for lower body dressing. She requires supervision for transfers and mobility. She is able to ambulate 25 feet with supervision and RW. Family education was done with daughter who will assist as needed  past discharge.    Disposition: 01-Home or Self Care   Diet: Renal diet. 1200 fluid/day  Special Instructions: 1. Wear support stockings or ace wrap RLE for edema control.  2. [facility] to provide PT, RN.  3. No driving or strenuous activity till cleared by MD.     Medication List    STOP taking these medications       amLODipine 5 MG tablet  Commonly known as:  NORVASC     doxazosin 8 MG tablet  Commonly known as:  CARDURA     hydrALAZINE 50 MG tablet  Commonly known as:  APRESOLINE     lisinopril 40 MG tablet  Commonly known as:  PRINIVIL,ZESTRIL      TAKE these medications       b complex-vitamin c-folic acid 0.8 MG Tabs tablet  Take 1 tablet by mouth daily.     famotidine 20 MG tablet  Commonly known as:  PEPCID  Take 1 tablet (20 mg total) by mouth at bedtime.     feeding supplement (NEPRO CARB STEADY) Liqd  Take 237 mLs by mouth daily.     glimepiride 1 MG tablet  Commonly known as:  AMARYL  Take 1 tablet (1 mg total) by mouth daily with breakfast.     midodrine 5 MG tablet  Commonly known as:  PROAMATINE  Take 1 tablet (5 mg total) by mouth every Monday, Wednesday, and Friday with hemodialysis.     pantoprazole 40 MG tablet  Commonly known as:  PROTONIX  Take 1 tablet (40 mg total) by mouth 2 (two) times daily.     sevelamer carbonate 800 MG tablet  Commonly known as:  RENVELA  Take 1,600 mg by mouth 3 (three) times daily with meals.     traMADol 50 MG tablet  Commonly known as:  ULTRAM  Take 50 mg by mouth every 12 (twelve) hours as needed (for pain).       Follow-up Information   Call Ranelle OysterSWARTZ,ZACHARY T, MD. (As needed)    Specialty:  Physical Medicine and Rehabilitation   Contact information:   [address], [address] [place] [address] [phone]       Follow up with Laurena SlimmerLARK,PRESTON S, MD On 11/04/2013. (@ 12:00 pm)    Specialty:  Internal Medicine   Contact information:   [address] [place]  [id number] [phone]       Follow up with Lesleigh NoeSMITH III,HENRY W, MD. Call today. (for follow up)    Specialty:  Cardiology   Contact information:   [address] [place] [address] [phone]       Call Theda BelfastHUNG,PATRICK D, MD. (for follow up on GI bleed. )    Specialty:  Gastroenterology   Contact information:   [address], [address] [place] [address] [phone]       Signed: Jacquelynn CreeLove, Hani Campusano S 10/26/2013, 8:45 AM

## 2013-10-29 ENCOUNTER — Ambulatory Visit: Payer: Medicare Other | Admitting: Cardiovascular Disease

## 2013-11-05 ENCOUNTER — Telehealth (HOSPITAL_COMMUNITY): Payer: Self-pay | Admitting: Interventional Radiology

## 2013-11-05 NOTE — Telephone Encounter (Signed)
Called pt's daughter to see if they wanted to schedule a f/u visit in clinic with Deveshwar. She said she would talk to her mother and call me back next week w/ an answer. JM

## 2013-11-19 ENCOUNTER — Inpatient Hospital Stay (HOSPITAL_COMMUNITY)
Admission: EM | Admit: 2013-11-19 | Discharge: 2013-11-24 | DRG: 286 | Disposition: A | Discharge status: Home Health | Payer: Medicare Other | Attending: Internal Medicine | Admitting: Internal Medicine

## 2013-11-19 ENCOUNTER — Emergency Department (HOSPITAL_COMMUNITY): Payer: Medicare Other

## 2013-11-19 ENCOUNTER — Encounter (HOSPITAL_COMMUNITY): Payer: Self-pay | Admitting: Emergency Medicine

## 2013-11-19 DIAGNOSIS — I359 Nonrheumatic aortic valve disorder, unspecified: Secondary | ICD-10-CM

## 2013-11-19 DIAGNOSIS — M899 Disorder of bone, unspecified: Secondary | ICD-10-CM | POA: Diagnosis present

## 2013-11-19 DIAGNOSIS — R001 Bradycardia, unspecified: Secondary | ICD-10-CM

## 2013-11-19 DIAGNOSIS — K922 Gastrointestinal hemorrhage, unspecified: Secondary | ICD-10-CM

## 2013-11-19 DIAGNOSIS — I10 Essential (primary) hypertension: Secondary | ICD-10-CM

## 2013-11-19 DIAGNOSIS — M949 Disorder of cartilage, unspecified: Secondary | ICD-10-CM

## 2013-11-19 DIAGNOSIS — J9600 Acute respiratory failure, unspecified whether with hypoxia or hypercapnia: Secondary | ICD-10-CM

## 2013-11-19 DIAGNOSIS — Z823 Family history of stroke: Secondary | ICD-10-CM

## 2013-11-19 DIAGNOSIS — K219 Gastro-esophageal reflux disease without esophagitis: Secondary | ICD-10-CM | POA: Diagnosis present

## 2013-11-19 DIAGNOSIS — N039 Chronic nephritic syndrome with unspecified morphologic changes: Secondary | ICD-10-CM

## 2013-11-19 DIAGNOSIS — I480 Paroxysmal atrial fibrillation: Secondary | ICD-10-CM

## 2013-11-19 DIAGNOSIS — I6529 Occlusion and stenosis of unspecified carotid artery: Secondary | ICD-10-CM | POA: Diagnosis present

## 2013-11-19 DIAGNOSIS — I2582 Chronic total occlusion of coronary artery: Secondary | ICD-10-CM | POA: Diagnosis present

## 2013-11-19 DIAGNOSIS — E875 Hyperkalemia: Secondary | ICD-10-CM | POA: Diagnosis not present

## 2013-11-19 DIAGNOSIS — R569 Unspecified convulsions: Secondary | ICD-10-CM

## 2013-11-19 DIAGNOSIS — E785 Hyperlipidemia, unspecified: Secondary | ICD-10-CM

## 2013-11-19 DIAGNOSIS — Z01810 Encounter for preprocedural cardiovascular examination: Secondary | ICD-10-CM

## 2013-11-19 DIAGNOSIS — I120 Hypertensive chronic kidney disease with stage 5 chronic kidney disease or end stage renal disease: Secondary | ICD-10-CM | POA: Diagnosis present

## 2013-11-19 DIAGNOSIS — I658 Occlusion and stenosis of other precerebral arteries: Secondary | ICD-10-CM | POA: Diagnosis present

## 2013-11-19 DIAGNOSIS — D631 Anemia in chronic kidney disease: Secondary | ICD-10-CM

## 2013-11-19 DIAGNOSIS — J9601 Acute respiratory failure with hypoxia: Secondary | ICD-10-CM

## 2013-11-19 DIAGNOSIS — R5381 Other malaise: Secondary | ICD-10-CM

## 2013-11-19 DIAGNOSIS — Z87898 Personal history of other specified conditions: Secondary | ICD-10-CM

## 2013-11-19 DIAGNOSIS — I4891 Unspecified atrial fibrillation: Secondary | ICD-10-CM | POA: Diagnosis present

## 2013-11-19 DIAGNOSIS — Z8673 Personal history of transient ischemic attack (TIA), and cerebral infarction without residual deficits: Secondary | ICD-10-CM

## 2013-11-19 DIAGNOSIS — Z79899 Other long term (current) drug therapy: Secondary | ICD-10-CM

## 2013-11-19 DIAGNOSIS — I2584 Coronary atherosclerosis due to calcified coronary lesion: Secondary | ICD-10-CM | POA: Diagnosis present

## 2013-11-19 DIAGNOSIS — Z992 Dependence on renal dialysis: Secondary | ICD-10-CM

## 2013-11-19 DIAGNOSIS — I080 Rheumatic disorders of both mitral and aortic valves: Secondary | ICD-10-CM

## 2013-11-19 DIAGNOSIS — Z86718 Personal history of other venous thrombosis and embolism: Secondary | ICD-10-CM

## 2013-11-19 DIAGNOSIS — I252 Old myocardial infarction: Secondary | ICD-10-CM

## 2013-11-19 DIAGNOSIS — E1165 Type 2 diabetes mellitus with hyperglycemia: Secondary | ICD-10-CM

## 2013-11-19 DIAGNOSIS — N186 End stage renal disease: Secondary | ICD-10-CM

## 2013-11-19 DIAGNOSIS — E669 Obesity, unspecified: Secondary | ICD-10-CM

## 2013-11-19 DIAGNOSIS — Z8719 Personal history of other diseases of the digestive system: Secondary | ICD-10-CM

## 2013-11-19 DIAGNOSIS — I350 Nonrheumatic aortic (valve) stenosis: Secondary | ICD-10-CM

## 2013-11-19 DIAGNOSIS — I2789 Other specified pulmonary heart diseases: Secondary | ICD-10-CM

## 2013-11-19 DIAGNOSIS — N2581 Secondary hyperparathyroidism of renal origin: Secondary | ICD-10-CM

## 2013-11-19 DIAGNOSIS — IMO0001 Reserved for inherently not codable concepts without codable children: Secondary | ICD-10-CM | POA: Diagnosis present

## 2013-11-19 DIAGNOSIS — Z8249 Family history of ischemic heart disease and other diseases of the circulatory system: Secondary | ICD-10-CM

## 2013-11-19 DIAGNOSIS — S88119A Complete traumatic amputation at level between knee and ankle, unspecified lower leg, initial encounter: Secondary | ICD-10-CM

## 2013-11-19 DIAGNOSIS — E1122 Type 2 diabetes mellitus with diabetic chronic kidney disease: Secondary | ICD-10-CM | POA: Diagnosis present

## 2013-11-19 DIAGNOSIS — G9340 Encephalopathy, unspecified: Secondary | ICD-10-CM

## 2013-11-19 DIAGNOSIS — D696 Thrombocytopenia, unspecified: Secondary | ICD-10-CM

## 2013-11-19 DIAGNOSIS — N189 Chronic kidney disease, unspecified: Secondary | ICD-10-CM

## 2013-11-19 DIAGNOSIS — I639 Cerebral infarction, unspecified: Secondary | ICD-10-CM

## 2013-11-19 DIAGNOSIS — I679 Cerebrovascular disease, unspecified: Secondary | ICD-10-CM

## 2013-11-19 DIAGNOSIS — Z8674 Personal history of sudden cardiac arrest: Secondary | ICD-10-CM

## 2013-11-19 DIAGNOSIS — I739 Peripheral vascular disease, unspecified: Secondary | ICD-10-CM

## 2013-11-19 DIAGNOSIS — I469 Cardiac arrest, cause unspecified: Principal | ICD-10-CM

## 2013-11-19 DIAGNOSIS — I82890 Acute embolism and thrombosis of other specified veins: Secondary | ICD-10-CM

## 2013-11-19 DIAGNOSIS — I2510 Atherosclerotic heart disease of native coronary artery without angina pectoris: Secondary | ICD-10-CM

## 2013-11-19 DIAGNOSIS — R531 Weakness: Secondary | ICD-10-CM

## 2013-11-19 DIAGNOSIS — E119 Type 2 diabetes mellitus without complications: Secondary | ICD-10-CM

## 2013-11-19 HISTORY — DX: Cerebral infarction, unspecified: I63.9

## 2013-11-19 HISTORY — DX: Acute myocardial infarction, unspecified: I21.9

## 2013-11-19 LAB — CBC WITH DIFFERENTIAL/PLATELET
BASOS ABS: 0 10*3/uL (ref 0.0–0.1)
Basophils Relative: 0 % (ref 0–1)
Eosinophils Absolute: 0.6 10*3/uL (ref 0.0–0.7)
Eosinophils Relative: 5 % (ref 0–5)
HCT: 35.3 % — ABNORMAL LOW (ref 36.0–46.0)
Hemoglobin: 11.4 g/dL — ABNORMAL LOW (ref 12.0–15.0)
Lymphocytes Relative: 11 % — ABNORMAL LOW (ref 12–46)
Lymphs Abs: 1.2 10*3/uL (ref 0.7–4.0)
MCH: 30.6 pg (ref 26.0–34.0)
MCHC: 32.3 g/dL (ref 30.0–36.0)
MCV: 94.9 fL (ref 78.0–100.0)
Monocytes Absolute: 0.3 10*3/uL (ref 0.1–1.0)
Monocytes Relative: 3 % (ref 3–12)
NEUTROS ABS: 8.8 10*3/uL — AB (ref 1.7–7.7)
Neutrophils Relative %: 81 % — ABNORMAL HIGH (ref 43–77)
Platelets: 150 10*3/uL (ref 150–400)
RBC: 3.72 MIL/uL — ABNORMAL LOW (ref 3.87–5.11)
RDW: 16.8 % — AB (ref 11.5–15.5)
WBC: 10.9 10*3/uL — AB (ref 4.0–10.5)

## 2013-11-19 LAB — CREATININE, SERUM
Creatinine, Ser: 7.53 mg/dL — ABNORMAL HIGH (ref 0.50–1.10)
GFR calc Af Amer: 6 mL/min — ABNORMAL LOW (ref >90)
GFR calc non Af Amer: 5 mL/min — ABNORMAL LOW (ref >90)

## 2013-11-19 LAB — CBC
HEMATOCRIT: 37.3 % (ref 36.0–46.0)
Hemoglobin: 12.2 g/dL (ref 12.0–15.0)
MCH: 31 pg (ref 26.0–34.0)
MCHC: 32.7 g/dL (ref 30.0–36.0)
MCV: 94.7 fL (ref 78.0–100.0)
Platelets: 164 10*3/uL (ref 150–400)
RBC: 3.94 MIL/uL (ref 3.87–5.11)
RDW: 16.6 % — AB (ref 11.5–15.5)
WBC: 7.6 10*3/uL (ref 4.0–10.5)

## 2013-11-19 LAB — HEMOGLOBIN A1C
Hgb A1c MFr Bld: 5.7 % — ABNORMAL HIGH (ref <5.7)
MEAN PLASMA GLUCOSE: 117 mg/dL — AB (ref <117)

## 2013-11-19 LAB — BASIC METABOLIC PANEL
BUN: 47 mg/dL — ABNORMAL HIGH (ref 6–23)
CHLORIDE: 99 meq/L (ref 96–112)
CO2: 25 mEq/L (ref 19–32)
Calcium: 10 mg/dL (ref 8.4–10.5)
Creatinine, Ser: 7.29 mg/dL — ABNORMAL HIGH (ref 0.50–1.10)
GFR calc Af Amer: 6 mL/min — ABNORMAL LOW (ref >90)
GFR calc non Af Amer: 5 mL/min — ABNORMAL LOW (ref >90)
Glucose, Bld: 182 mg/dL — ABNORMAL HIGH (ref 70–99)
Potassium: 4.6 mEq/L (ref 3.7–5.3)
SODIUM: 142 meq/L (ref 137–147)

## 2013-11-19 LAB — I-STAT TROPONIN, ED: TROPONIN I, POC: 0.03 ng/mL (ref 0.00–0.08)

## 2013-11-19 LAB — MAGNESIUM: Magnesium: 2.4 mg/dL (ref 1.5–2.5)

## 2013-11-19 LAB — GLUCOSE, CAPILLARY
Glucose-Capillary: 125 mg/dL — ABNORMAL HIGH (ref 70–99)
Glucose-Capillary: 152 mg/dL — ABNORMAL HIGH (ref 70–99)

## 2013-11-19 LAB — MRSA PCR SCREENING: MRSA by PCR: NEGATIVE

## 2013-11-19 LAB — CBG MONITORING, ED: GLUCOSE-CAPILLARY: 131 mg/dL — AB (ref 70–99)

## 2013-11-19 LAB — TROPONIN I: Troponin I: <0.3 ng/mL

## 2013-11-19 LAB — PHOSPHORUS: PHOSPHORUS: 3.4 mg/dL (ref 2.3–4.6)

## 2013-11-19 MED ORDER — FAMOTIDINE 20 MG PO TABS
20.0000 mg | ORAL_TABLET | Freq: Every day | ORAL | Status: DC
  Administered 2013-11-19 – 2013-11-23 (×4): 20 mg via ORAL
  Filled 2013-11-19 (×6): qty 1

## 2013-11-19 MED ORDER — ACETAMINOPHEN 650 MG RE SUPP: 650.0000 mg | Freq: Four times a day (QID) | RECTAL | Status: DC | PRN

## 2013-11-19 MED ORDER — PREDNISOLONE ACETATE 1 % OP SUSP
1.0000 [drp] | Freq: Three times a day (TID) | OPHTHALMIC | Status: DC
Start: 2013-11-20 — End: 2013-11-24
  Administered 2013-11-20 – 2013-11-24 (×12): 1 [drp] via OPHTHALMIC
  Filled 2013-11-19 (×2): qty 1

## 2013-11-19 MED ORDER — NEPRO/CARBSTEADY PO LIQD
237.0000 mL | Freq: Every day | ORAL | Status: DC
  Administered 2013-11-19 – 2013-11-23 (×3): 237 mL via ORAL
  Filled 2013-11-19 (×6): qty 237

## 2013-11-19 MED ORDER — PANTOPRAZOLE SODIUM 40 MG PO TBEC
40.0000 mg | DELAYED_RELEASE_TABLET | Freq: Two times a day (BID) | ORAL | Status: DC
  Administered 2013-11-19 – 2013-11-23 (×8): 40 mg via ORAL
  Filled 2013-11-19 (×8): qty 1

## 2013-11-19 MED ORDER — HEPARIN SODIUM (PORCINE) 5000 UNIT/ML IJ SOLN
5000.0000 [IU] | Freq: Three times a day (TID) | INTRAMUSCULAR | Status: DC
Start: 2013-11-19 — End: 2013-11-22
  Administered 2013-11-19 – 2013-11-21 (×7): 5000 [IU] via SUBCUTANEOUS
  Filled 2013-11-19 (×11): qty 1

## 2013-11-19 MED ORDER — ONDANSETRON HCL 4 MG/2ML IJ SOLN: 4.0000 mg | Freq: Four times a day (QID) | INTRAMUSCULAR | Status: DC | PRN

## 2013-11-19 MED ORDER — MIDODRINE HCL 5 MG PO TABS
5.0000 mg | ORAL_TABLET | ORAL | Status: DC
  Filled 2013-11-19 (×3): qty 1

## 2013-11-19 MED ORDER — RENA-VITE PO TABS
1.0000 | ORAL_TABLET | Freq: Every day | ORAL | Status: DC
  Administered 2013-11-19 – 2013-11-20 (×2): 1 via ORAL
  Administered 2013-11-21: 22:00:00 via ORAL
  Administered 2013-11-22 – 2013-11-23 (×2): 1 via ORAL
  Filled 2013-11-19 (×7): qty 1

## 2013-11-19 MED ORDER — ONDANSETRON HCL 4 MG/2ML IJ SOLN
4.0000 mg | Freq: Once | INTRAMUSCULAR | Status: AC
  Administered 2013-11-19: 4 mg via INTRAVENOUS
  Filled 2013-11-19: qty 2

## 2013-11-19 MED ORDER — INSULIN ASPART 100 UNIT/ML ~~LOC~~ SOLN
0.0000 [IU] | Freq: Three times a day (TID) | SUBCUTANEOUS | Status: DC
Start: 2013-11-19 — End: 2013-11-24
  Administered 2013-11-19: 1 [IU] via SUBCUTANEOUS
  Administered 2013-11-20: 2 [IU] via SUBCUTANEOUS
  Administered 2013-11-21: 1 [IU] via SUBCUTANEOUS
  Administered 2013-11-21: 2 [IU] via SUBCUTANEOUS
  Administered 2013-11-23: 1 [IU] via SUBCUTANEOUS
  Administered 2013-11-23: 5 [IU] via SUBCUTANEOUS
  Administered 2013-11-23 – 2013-11-24 (×2): 1 [IU] via SUBCUTANEOUS
  Administered 2013-11-24: 3 [IU] via SUBCUTANEOUS

## 2013-11-19 MED ORDER — ONDANSETRON HCL 4 MG PO TABS: 4.0000 mg | ORAL_TABLET | Freq: Four times a day (QID) | ORAL | Status: DC | PRN

## 2013-11-19 MED ORDER — ACETAMINOPHEN 325 MG PO TABS
650.0000 mg | ORAL_TABLET | Freq: Four times a day (QID) | ORAL | Status: DC | PRN
  Filled 2013-11-19: qty 2

## 2013-11-19 MED ORDER — MORPHINE SULFATE 2 MG/ML IJ SOLN: 1.0000 mg | INTRAMUSCULAR | Status: DC | PRN

## 2013-11-19 MED ORDER — SEVELAMER CARBONATE 800 MG PO TABS
1600.0000 mg | ORAL_TABLET | Freq: Three times a day (TID) | ORAL | Status: DC
  Administered 2013-11-19 – 2013-11-24 (×10): 1600 mg via ORAL
  Filled 2013-11-19 (×18): qty 2

## 2013-11-19 NOTE — Progress Notes (Signed)
[facility]  History and Physical  Briana Gould MRN:[id number] DOB: 03/17/1946 DOA: 11/19/2013  Referring physician: Dr. Docherty  PCP: CLARK,PRESTON S, MD  Chief Complaint: cardiac arrest  HPI: Briana Gould is a 68 y.o. female with pmh significant for DM type 2, PAD (s/p BKA), ESRD, GERD, CAD and two prior admissions (Jan and Feb) with cardiac arrest; came to hospital from HD center after experiencing another mild episode of cardiac arrest after initiating HD therapy. Patient denies any prodromic symptoms prior to episode and per reports she was noticed to have agonal breathing and subsequent pulseless episode. Patient received CPR and turned around with just chest compression. She was transferred to ED for further evaluation. While in ED patient hs been fine, VSS and denies any acute complaints, except for mild HA. CT head negative. [facility] called to admit patient for further evaluation and treatment.  Review of Systems:  Negative except as mentioned on HPI.  Past Medical History   Diagnosis  Date   .  PAD (peripheral artery disease)      a. L BKA   .  Type II diabetes mellitus    .  Anemia    .  History of blood transfusion  2002     "related to amputation" (08/13/2013)   .  End stage renal disease on dialysis      "[facility]; M, W, F" (08/13/2013)   .  DVT (deep venous thrombosis)      a. R IJ DVT 08/2013.   .  CVA (cerebral infarction)      a. MRI 08/2013: possible left motor strip infarct with remote pontine infarct.   .  Pulmonary HTN      a. Cath 2011 - mod to severe pulmonary hypertension. b. Echo 08/2013: PAP 45mmHg (mild-mod increased).   .  Thrombocytopenia    .  CAD (coronary artery disease)      a. Severe diffuse diabetic disease by cath 2011, for med rx, not a candidate for CABG or PCI at that time.   .  Aortic stenosis    .  Syncope      a. 2011, etiology never clear.   .  Carotid artery disease      a. Dopp 08/2013: 1-39% LICA< 60-79% RICA  borderline >80%.   .  Cerebrovascular disease      a. 08/2013 MRA brain: multiple bilateral moderate and high grade intracranial stenoses   .  Cardiac arrest  08/14/13     a. Possible arrest in setting of hypotension during dialysis with possible arrest requiring brief CPR, not intubated and was fully intact after episode. Suspected due to low cerebral perfusion in setting of hypotension. Unclear if any arrhythmia.   .  End stage renal disease on dialysis    .  Peripheral arterial occlusive disease    .  Hypertension    .  Cardiac arrest    .  Diabetes mellitus type 2, uncontrolled, with complications    .  DVT (deep venous thrombosis)      Right internal jugular vein   .  CAD (coronary artery disease), native coronary artery  2011     Diffuse distal vessel CAD noted at cardiac catheterization   .  MI (myocardial infarction)     Past Surgical History   Procedure  Laterality  Date   .  Av fistula placement  Left  2011     upper arm   .  Below knee leg amputation  Left    2002   .  Tonsillectomy     .  Cataract extraction w/ intraocular lens implant, bilateral  Bilateral  2012-2014   .  Esophagogastroduodenoscopy  N/A  08/30/2013     Procedure: ESOPHAGOGASTRODUODENOSCOPY (EGD); Surgeon: Patrick D Hung, MD; Location: [facility]; Service: Endoscopy; Laterality: N/A;   .  Below knee leg amputation  Left    .  Av fistula placement  Left     Social History: reports that she has never smoked. She does not have any smokeless tobacco history on file. She reports that she does not drink alcohol or use illicit drugs.  Allergies   Allergen  Reactions   .  Phenergan [Promethazine Hcl]  Other (See Comments)     Causes patient to" vomit more"   .  Vitamin D Analogs  Rash    Family History   Problem  Relation  Age of Onset   .  Hypertension  Mother    .  Hypertension  Father    .  Heart attack  Brother    .  Stroke  Mother    .  Pulmonary embolism  Father     Prior to Admission medications    Medication  Sig  Start Date  End Date  Taking?  Authorizing Provider   b complex-vitamin c-folic acid (NEPHRO-VITE) 0.8 MG TABS tablet  Take 1 tablet by mouth daily.    Yes  Historical Provider, MD   famotidine (PEPCID) 20 MG tablet  Take 1 tablet (20 mg total) by mouth at bedtime.  10/25/13   Yes  Pamela S Love, PA-C   glimepiride (AMARYL) 1 MG tablet  Take 1 tablet (1 mg total) by mouth daily with breakfast.  10/25/13   Yes  Pamela S Love, PA-C   midodrine (PROAMATINE) 5 MG tablet  Take 1 tablet (5 mg total) by mouth every Monday, Wednesday, and Friday with hemodialysis.  10/25/13   Yes  Pamela S Love, PA-C   Nutritional Supplements (FEEDING SUPPLEMENT, NEPRO CARB STEADY,) LIQD  Take 237 mLs by mouth daily.    Yes  Historical Provider, MD   pantoprazole (PROTONIX) 40 MG tablet  Take 1 tablet (40 mg total) by mouth 2 (two) times daily.  10/25/13   Yes  Pamela S Love, PA-C   sevelamer carbonate (RENVELA) 800 MG tablet  Take 1,600 mg by mouth 3 (three) times daily with meals.    Yes  Historical Provider, MD   Physical Exam:  Filed Vitals:    11/19/13 1500   BP:  143/70   Pulse:  71   Temp:    Resp:  21    BP 143/70  Pulse 71  Temp(Src) 97.8 F (36.6 C) (Oral)  Resp 21  Ht 5' 6" (1.676 m)  Wt 81.7 kg (180 lb 1.9 oz)  BMI 29.09 kg/m2  SpO2 100%  General: Appears calm and comfortable, no fever  Eyes: PERRL, normal lids, irises & conjunctiva  ENT: grossly normal hearing, lips & tongue, no erythema or exudates  Neck: no LAD, masses or thyromegaly, no JVD  Cardiovascular: RRR, no rubs or gallops; positive SEM; 2 ++ edema RLE  Telemetry: SR, no arrhythmias appreciated on tele  Respiratory: CTA bilaterally, no w/r/r. Normal respiratory effort.  Abdomen: soft, nt, nd and positive BS  Skin: no rash or induration seen on exam  Musculoskeletal: grossly normal tone BUE/BLE; left BKA; 2++ edema  Psychiatric: grossly normal mood and affect, speech fluent and appropriate  Neurologic: grossly  non-focal.            Labs on Admission:  Basic Metabolic Panel:   Recent Labs  Lab  11/19/13 1003   NA  142   K  4.6   CL  99   CO2  25   GLUCOSE  182*   BUN  47*   CREATININE  7.29*   CALCIUM  10.0    CBC:   Recent Labs  Lab  11/19/13 1003   WBC  10.9*   NEUTROABS  8.8*   HGB  11.4*   HCT  35.3*   MCV  94.9   PLT  150    BNP (last 3 results)   Recent Labs   09/20/13 0913  09/20/13 1155   PROBNP  57457.0*  47172.0*    CBG:   Recent Labs  Lab  11/19/13 1339   GLUCAP  131*    Radiological Exams on Admission:  Ct Head Wo Contrast  11/19/2013 CLINICAL DATA: Cardiac arrest during dialysis. Vomiting after resuscitation. Headache. EXAM: CT HEAD WITHOUT CONTRAST TECHNIQUE: Contiguous axial images were obtained from the base of the skull through the vertex without intravenous contrast. COMPARISON: IR ANGIO VERTEBRAL SEL SUBCLAVIAN INNOMINATE BILAT MOD SED dated 09/28/2013; CT HEAD W/O CM dated 09/20/2013 FINDINGS: Focal central pontine hypodensity, image 10 of series 2, stable, most compatible with remote lacunar infarct. Otherwise, the brainstem, cerebellum, cerebral peduncles, thalamus, basal ganglia, basilar cisterns, and ventricular system appear within normal limits. No intracranial hemorrhage, mass lesion, or acute CVA. There is atherosclerotic calcification of the cavernous carotid arteries bilaterally. IMPRESSION: 1. Small remote lacunar infarct in the mid pons. No acute intracranial findings. Electronically Signed By: Walt Liebkemann M.D. On: 11/19/2013 11:13  Dg Chest Port 1 View  11/19/2013 CLINICAL DATA: Loss of consciousness EXAM: PORTABLE CHEST - 1 VIEW COMPARISON: September 25, 2013 FINDINGS: The heart size and mediastinal contours are stable. The heart size is enlarged. There is pulmonary edema. There is no focal pneumonia or pleural effusion. The visualized skeletal structures are stable. IMPRESSION: Congestive heart failure. Electronically Signed By: Wei-Chen Lin  M.D. On: 11/19/2013 09:57   EKG:  Ordered and pending  Assessment/Plan  1-Cardiac arrest: exact etiology remains unclear. This is patient third cardiac arrest in the last 3 months.  -this time patient was able to recover after just some CP and her VS are now stable, same as mentation. Patient denies any prodromic symptoms.  -will admit to stepdown for close observation; check TSH, Mg, Phosphorus and cycle troponin  -will provide supportive care and follow rec's from cardiology service (plan from previous admissions included decide on cardiac cath and pace maker implantation)  -avoid any agent that can slow doen her heart  -be permissive with HTN  2-DM: will check A1C; start SSI  3-ESRD (end stage renal disease) on dialysis: per renal service.  -plan is for HD tomorrow.  -Potassium WNL  4-Anemia of chronic renal failure: per renal.  -Hgb stable.  5-Paroxysmal a-fib: rate controlled and currently in sinus rhythm.  -will monitor on telemetry  6-PAD: status post left BKA. Stump is intact and without any open wounds or drainage  7-GERD: continue PPI  Renal service  Cardiology  Code Status: Full  Family Communication: daughter (Dana) at bedside  Disposition Plan: inpatient, LOS > 2 midnights, stepdown  Time spent: 50 minutes  [place], Cinch Ormond E Ciel Yanes  [facility]  Pager [phone]  

## 2013-11-19 NOTE — ED Notes (Signed)
Paged IV team to take out dialysis needles. MD aware

## 2013-11-19 NOTE — ED Notes (Signed)
Pt at dialysis this morning and 20 minutes into procedure she stopped breathing and pulseless. Staff did 15 compressions and pt became awake. When EMS arrived pt was alert and oriented, but vomiting. EMS gave 4mg  zofran. EMS states pt's HR was in 80's junctional. CBG 193. Sats 100% on room air. 192/119 BP

## 2013-11-19 NOTE — Progress Notes (Signed)
Deacessed graft ,small amount of blood on site noted;pressure drsg applied.Rn at bedside.Educated pt.

## 2013-11-19 NOTE — ED Notes (Signed)
Family at bedside. 

## 2013-11-19 NOTE — Plan of Care (Signed)
Problem: Phase I Progression Outcomes Goal: Initial discharge plan identified Variance: Arrhythmia Comments: Patient admitted for s/p Cardiac Arrest @ Hemodialysis

## 2013-11-19 NOTE — ED Provider Notes (Signed)
CSN: [id number]     Arrival date & time 11/19/13  9528 History   First MD Initiated Contact with Patient 11/19/13 0940     Chief Complaint  Patient presents with  . Loss of Consciousness     (Consider location/radiation/quality/duration/timing/severity/associated sxs/prior Treatment) HPI Comments: Pt was in first 20 mins of HD session when she complained of nausea, then became unresponsive. She was pulseless, apneic and received about 15 compressions before regaining pulse & consiousness. She had a similar episode 3 months ago as well as 2 months ago.  During the latter episode, she received CPR for 20 mins, was intubated and admitted to the ICU. Pt has no memory of today's episode, still having nausea. She denies CP.   Patient is a 68 y.o. female presenting with altered mental status. The history is provided by the patient and the EMS personnel. No language interpreter was used.  Altered Mental Status Presenting symptoms: unresponsiveness   Presenting symptoms: no confusion   Severity:  Severe Most recent episode:  Today Episode history:  Single Timing:  Rare Progression:  Resolved Chronicity:  Recurrent (Two prior recent episodes of asystole during HD requiring CPR.) Associated symptoms: nausea   Associated symptoms: no abdominal pain, no agitation, no fever, no headaches, no palpitations, no vomiting and no weakness     Past Medical History  Diagnosis Date  . PAD (peripheral artery disease)     a. L BKA  . Type II diabetes mellitus   . Anemia   . History of blood transfusion 2002    "related to amputation" (08/13/2013)  . End stage renal disease on dialysis     "[facility]; M, W, F" (08/13/2013)  . DVT (deep venous thrombosis)     a. R IJ DVT 08/2013.  Marland Kitchen CVA (cerebral infarction)     a. MRI 08/2013: possible left motor strip infarct with remote pontine infarct.  . Pulmonary HTN     a. Cath 2011 - mod to severe pulmonary hypertension. b. Echo 08/2013: PAP  (mild-mod increased).  . Thrombocytopenia   . CAD (coronary artery disease)     a. Severe diffuse diabetic disease by cath 2011, for med rx, not a candidate for CABG or PCI at that time.  . Aortic stenosis   . Syncope     a. 2011, etiology never clear.  . Carotid artery disease     a. Dopp 41/3244: 1-39% LICA< 60-79% RICA borderline >80%.  . Cerebrovascular disease     a. 08/2013 MRA brain: multiple bilateral moderate and high grade intracranial stenoses  . Cardiac arrest 08/14/13    a. Possible arrest in setting of hypotension during dialysis with possible arrest requiring brief CPR, not intubated and was fully intact after episode. Suspected due to low cerebral perfusion in setting of hypotension. Unclear if any arrhythmia.  . End stage renal disease on dialysis   . Peripheral arterial occlusive disease   . Hypertension   . Cardiac arrest   . Diabetes mellitus type 2, uncontrolled, with complications   . DVT (deep venous thrombosis)      Right internal jugular vein  . CAD (coronary artery disease), native coronary artery 2011    Diffuse distal vessel CAD noted at cardiac catheterization  . MI (myocardial infarction)   . Stroke    Past Surgical History  Procedure Laterality Date  . Av fistula placement Left 2011    upper arm  . Below knee leg amputation Left 2002  . Tonsillectomy    .  Cataract extraction w/ intraocular lens  implant, bilateral Bilateral 2012-2014  . Esophagogastroduodenoscopy N/A 08/30/2013    Procedure: ESOPHAGOGASTRODUODENOSCOPY (EGD);  Surgeon: Theda BelfastPatrick D Hung, MD;  [facility];  Service: Endoscopy;  Laterality: N/A;  . Below knee leg amputation Left   . Av fistula placement Left    Family History  Problem Relation Age of Onset  . Hypertension Mother   . Hypertension Father   . Heart attack Brother   . Stroke Mother   . Pulmonary embolism Father    History  Substance Use Topics  . Smoking status: Never Smoker   . Smokeless tobacco: Not on  file  . Alcohol Use: No   OB History   Grav Para Term Preterm Abortions TAB SAB Ect Mult Living                 Review of Systems  Constitutional: Negative for fever, chills, diaphoresis, activity change, appetite change and fatigue.  HENT: Negative for congestion, facial swelling, rhinorrhea and sore throat.   Eyes: Negative for photophobia and discharge.  Respiratory: Negative for cough, chest tightness and shortness of breath.   Cardiovascular: Negative for chest pain, palpitations and leg swelling.  Gastrointestinal: Positive for nausea. Negative for vomiting, abdominal pain and diarrhea.  Endocrine: Negative for polydipsia and polyuria.  Genitourinary: Negative for dysuria, frequency, difficulty urinating and pelvic pain.  Musculoskeletal: Negative for arthralgias, back pain, neck pain and neck stiffness.  Skin: Negative for color change and wound.  Allergic/Immunologic: Negative for immunocompromised state.  Neurological: Negative for facial asymmetry, weakness, numbness and headaches.  Hematological: Does not bruise/bleed easily.  Psychiatric/Behavioral: Negative for confusion and agitation.      Allergies  Phenergan and Vitamin d analogs  Home Medications   No current outpatient prescriptions on file. BP 185/83  Pulse 73  Temp(Src) 98.8 F (37.1 C) (Oral)  Resp 20  Ht 5\' 6"  (1.676 m)  Wt 180 lb 1.9 oz (81.7 kg)  BMI 29.09 kg/m2  SpO2 100% Physical Exam  Constitutional: She is oriented to person, place, and time. She appears well-developed and well-nourished. No distress.  HENT:  Head: Normocephalic and atraumatic.  Mouth/Throat: No oropharyngeal exudate.  Eyes: Pupils are equal, round, and reactive to light.  Neck: Normal range of motion. Neck supple.  Cardiovascular: Normal rate, regular rhythm and normal heart sounds.  Exam reveals no gallop and no friction rub.   No murmur heard. Pulmonary/Chest: Effort normal and breath sounds normal. No respiratory  distress. She has no wheezes. She has no rales.  Abdominal: Soft. Bowel sounds are normal. She exhibits no distension and no mass. There is no tenderness. There is no rebound and no guarding.  Musculoskeletal: Normal range of motion. She exhibits no edema and no tenderness.  Neurological: She is alert and oriented to person, place, and time. GCS eye subscore is 4. GCS verbal subscore is 5. GCS motor subscore is 6.  Skin: Skin is warm and dry.  Psychiatric: She has a normal mood and affect.    ED Course  Procedures (including critical care time) Labs Review Labs Reviewed  CBC WITH DIFFERENTIAL - Abnormal; Notable for the following:    WBC 10.9 (*)    RBC 3.72 (*)    Hemoglobin 11.4 (*)    HCT 35.3 (*)    RDW 16.8 (*)    Neutrophils Relative % 81 (*)    Neutro Abs 8.8 (*)    Lymphocytes Relative 11 (*)    All other components within  normal limits  BASIC METABOLIC PANEL - Abnormal; Notable for the following:    Glucose, Bld 182 (*)    BUN 47 (*)    Creatinine, Ser 7.29 (*)    GFR calc non Af Amer 5 (*)    GFR calc Af Amer 6 (*)    All other components within normal limits  CBC - Abnormal; Notable for the following:    RDW 16.6 (*)    All other components within normal limits  CREATININE, SERUM - Abnormal; Notable for the following:    Creatinine, Ser 7.53 (*)    GFR calc non Af Amer 5 (*)    GFR calc Af Amer 6 (*)    All other components within normal limits  GLUCOSE, CAPILLARY - Abnormal; Notable for the following:    Glucose-Capillary 125 (*)    All other components within normal limits  GLUCOSE, CAPILLARY - Abnormal; Notable for the following:    Glucose-Capillary 152 (*)    All other components within normal limits  CBG MONITORING, ED - Abnormal; Notable for the following:    Glucose-Capillary 131 (*)    All other components within normal limits  MRSA PCR SCREENING  URINE CULTURE  TROPONIN I  MAGNESIUM  PHOSPHORUS  URINALYSIS, ROUTINE W REFLEX MICROSCOPIC   TROPONIN I  TROPONIN I  HEMOGLOBIN A1C  BASIC METABOLIC PANEL  CBC  TSH  I-STAT TROPOININ, ED   Imaging Review Ct Head Wo Contrast  11/19/2013   CLINICAL DATA:  Cardiac arrest during dialysis. Vomiting after resuscitation. Headache.  EXAM: CT HEAD WITHOUT CONTRAST  TECHNIQUE: Contiguous axial images were obtained from the base of the skull through the vertex without intravenous contrast.  COMPARISON:  IR ANGIO VERTEBRAL SEL SUBCLAVIAN INNOMINATE BILAT MOD SED dated 09/28/2013; CT HEAD W/O CM dated 09/20/2013  FINDINGS: Focal central pontine hypodensity, image 10 of series 2, stable, most compatible with remote lacunar infarct.  Otherwise, the brainstem, cerebellum, cerebral peduncles, thalamus, basal ganglia, basilar cisterns, and ventricular system appear within normal limits. No intracranial hemorrhage, mass lesion, or acute CVA. There is atherosclerotic calcification of the cavernous carotid arteries bilaterally.  IMPRESSION: 1. Small remote lacunar infarct in the mid pons. No acute intracranial findings.   Electronically Signed   By: Herbie Baltimore M.D.   On: 11/19/2013 11:13   Dg Chest Port 1 View  11/19/2013   CLINICAL DATA:  Loss of consciousness  EXAM: PORTABLE CHEST - 1 VIEW  COMPARISON:  September 25, 2013  FINDINGS: The heart size and mediastinal contours are stable. The heart size is enlarged. There is pulmonary edema. There is no focal pneumonia or pleural effusion. The visualized skeletal structures are stable.  IMPRESSION: Congestive heart failure.   Electronically Signed   By: Sherian Rein M.D.   On: 11/19/2013 09:57     EKG Interpretation None      Date: 11/19/2013 @09 :58  Rate: 78  Rhythm: sinus  QRS Axis: left  Intervals: QRS wide  ST/T Wave abnormalities: nonspecific ST/T changes  Conduction Disutrbances:left bundle branch block  Narrative Interpretation:   Old EKG Reviewed: changes noted, QRS widening now more prominent, with prior EKG considered nonspecific IVCD  with borderline axis.   MDM   Final diagnoses:  Cardiac arrest    Pt is a 68 y.o. female with Pmhx as above who presents with episode of pulselessness requiring CPR at HD this morning about 20 min into treatment. Daughter reports this is third episode during HD in past 3 months where  she has become pulseless & required CPR.  Last episode was in Jan, required 20 mins CPR, intubation, and ICU admission. Pt states she had only preceding nausea, has no memory of events and now is asymptomatic except for mild nausea. EKG with a more prominent QRS widening and axis which has gone from borderline to left axis. Upon exam, Pt hypertensive, but VS otherwise stable, she is awake, alert and at baseline mental status per daughter. Cardiopulm exam benign. CXR w/ stable cardiomegaly, and pulmonary edema. CT head w/o acute changes. Labs show mild leukocytosis, stable Cr, K 4.6, CO2 25, stable Hb, negative first trop.  Given concern for likely repeat episodes, feel she should be admitted for further w/u, repeat cardiology consult.  [facility] will admit to stepdown.         Shanna Cisco, MD 11/20/13 [phone]

## 2013-11-19 NOTE — Consult Note (Signed)
[facility] Renal Consultation Note  Indication for Consultation:  Management of ESRD/hemodialysis; anemia, hypertension/volume and secondary hyperparathyroidism  HPI: Briana Gould is a 68 y.o. female on chronic hemodialysis MWF([facility]) brought to the ER via EMS  After episode on OP dialysis this morning = 20 minutes into procedure she had another episode of cardiorespiratory arrest with  Staff doing 15 compressions and pt became awake. When EMS arrived pt was  Vomiting,alert and oriented, EMS gave 45m zofran. EMS states pt's HR was in 80's junctional. CBG 193. Sats 100% on room air. 192/119 BP. Pt denies chest pain  Or sob. Per pt . She was in her normal health ,"yesterday out all day walking around with L BKA prosthesis without problems" . Noted this year also admit  1-12>10-25-13  With  PEA occuring on HDand workup showing  severe stenosis of left ICA cavernous junction 95%. 60% stenosis of right ICA approximately and 50% stenosis left ICA petrous segment,  started on antiplatelets in anticipation of stent placement , developed A Fib with RVR as well as recurrent GIB requiring 2 units PRBC. Cardiac cath recommended by Dr. NJohnsie Cancelbut patient declined with plans for L-ICA stent in the future. Hypotension managed with midodrine support prior to HD. Patient was  deconditioned and she had rehab admit.    Now in her room on 2H unit no complaints of sob or chest pain,melena, fevers or chills. "just started using lower portion of Avf  With Button hole needles and upper portion sharp needles . She is 2 kg over her edw of 68kg with bp 180/80 She took her Midodrine this am pre hd and denies missing other bp meds yesterday.     Past Medical History  Diagnosis Date  . PAD (peripheral artery disease)     a. L BKA  . Type II diabetes mellitus   . Anemia   . History of blood transfusion 2002    "related to amputation" (08/13/2013)  . End stage renal disease [facility] dialysis    [facility] ([facility])  . DVT (deep venous thrombosis)     a. R IJ DVT 08/2013.  .Marland KitchenCVA (cerebral infarction)     a. MRI 08/2013: possible left motor strip infarct with remote pontine infarct.  . Pulmonary HTN     a. Cath 2011 - mod to severe pulmonary hypertension. b. Echo 08/2013: PAP 472mg (mild-mod increased).  . Thrombocytopenia   . CAD (coronary artery disease)     a. Severe diffuse diabetic disease by cath 2011, for med rx, not a candidate for CABG or PCI at that time.  . Aortic stenosis   . Syncope     a. 2011, etiology never clear.  . Carotid artery disease     a. Dopp 1233/35451-6-25%ICA< 6063-89%ICA borderline >80%.  . Cerebrovascular disease     a. 08/2013 MRA brain: multiple bilateral moderate and high grade intracranial stenoses  . Cardiac arrest 08/14/13    a. Possible arrest in setting of hypotension during dialysis with possible arrest requiring brief CPR, not intubated and was fully intact after episode. Suspected due to low cerebral perfusion in setting of hypotension. Unclear if any arrhythmia.  . End stage renal disease on dialysis   . Peripheral arterial occlusive disease   . Hypertension   . Cardiac arrest   . Diabetes mellitus type 2, uncontrolled, with complications   . DVT (deep venous thrombosis)      Right internal jugular vein  .  CAD (coronary artery disease), native coronary artery 2011    Diffuse distal vessel CAD noted at cardiac catheterization  . MI (myocardial infarction)     Past Surgical History  Procedure Laterality Date  . Av fistula placement Left 2011    upper arm  . Below knee leg amputation Left 2002  . Tonsillectomy    . Cataract extraction w/ intraocular lens  implant, bilateral Bilateral 2012-2014  . Esophagogastroduodenoscopy N/A 08/30/2013    Procedure: ESOPHAGOGASTRODUODENOSCOPY (EGD);  Surgeon: Beryle Beams, MD;  Location: [facility] ENDOSCOPY;  Service: Endoscopy;  Laterality: N/A;  . Below knee leg  amputation Left   . Av fistula placement Left       Family History  Problem Relation Age of Onset  . Hypertension Mother   . Hypertension Father   . Heart attack Brother   . Stroke Mother   . Pulmonary embolism Father    Social= Lives with husband in [place]    reports that she has never smoked. She does not have any smokeless tobacco history on file. She reports that she does not drink alcohol or use illicit drugs.   Allergies  Allergen Reactions  . Phenergan [Promethazine Hcl] Other (See Comments)    Causes patient to" vomit more"  . Vitamin D Analogs Rash    Prior to Admission medications   Medication Sig Start Date End Date Taking? Authorizing Provider  b complex-vitamin c-folic acid (NEPHRO-VITE) 0.8 MG TABS tablet Take 1 tablet by mouth daily.   Yes Historical Provider, MD  famotidine (PEPCID) 20 MG tablet Take 1 tablet (20 mg total) by mouth at bedtime. 10/25/13  Yes Ivan Anchors Love, PA-C  glimepiride (AMARYL) 1 MG tablet Take 1 tablet (1 mg total) by mouth daily with breakfast. 10/25/13  Yes Ivan Anchors Love, PA-C  midodrine (PROAMATINE) 5 MG tablet Take 1 tablet (5 mg total) by mouth every Monday, Wednesday, and Friday with hemodialysis. 10/25/13  Yes Ivan Anchors Love, PA-C  Nutritional Supplements (FEEDING SUPPLEMENT, NEPRO CARB STEADY,) LIQD Take 237 mLs by mouth daily.   Yes Historical Provider, MD  pantoprazole (PROTONIX) 40 MG tablet Take 1 tablet (40 mg total) by mouth 2 (two) times daily. 10/25/13  Yes Ivan Anchors Love, PA-C  sevelamer carbonate (RENVELA) 800 MG tablet Take 1,600 mg by mouth 3 (three) times daily with meals.    Yes Historical Provider, MD    [id number], acetaminophen, morphine injection, ondansetron (ZOFRAN) IV, ondansetron  Results for orders placed during the hospital encounter of 11/19/13 (from the past 48 hour(s))  CBC WITH DIFFERENTIAL     Status: Abnormal   Collection Time    11/19/13 10:03 AM      Result Value Ref Range   WBC 10.9 (*) 4.0 -  10.5 K/uL   RBC 3.72 (*) 3.87 - 5.11 MIL/uL   Hemoglobin 11.4 (*) 12.0 - 15.0 g/dL   HCT 35.3 (*) 36.0 - 46.0 %   MCV 94.9  78.0 - 100.0 fL   MCH 30.6  26.0 - 34.0 pg   MCHC 32.3  30.0 - 36.0 g/dL   RDW 16.8 (*) 11.5 - 15.5 %   Platelets 150  150 - 400 K/uL   Neutrophils Relative % 81 (*) 43 - 77 %   Neutro Abs 8.8 (*) 1.7 - 7.7 K/uL   Lymphocytes Relative 11 (*) 12 - 46 %   Lymphs Abs 1.2  0.7 - 4.0 K/uL   Monocytes Relative 3  3 - 12 %  Monocytes Absolute 0.3  0.1 - 1.0 K/uL   Eosinophils Relative 5  0 - 5 %   Eosinophils Absolute 0.6  0.0 - 0.7 K/uL   Basophils Relative 0  0 - 1 %   Basophils Absolute 0.0  0.0 - 0.1 K/uL  BASIC METABOLIC PANEL     Status: Abnormal   Collection Time    11/19/13 10:03 AM      Result Value Ref Range   Sodium 142  137 - 147 mEq/L   Potassium 4.6  3.7 - 5.3 mEq/L   Chloride 99  96 - 112 mEq/L   CO2 25  19 - 32 mEq/L   Glucose, Bld 182 (*) 70 - 99 mg/dL   BUN 47 (*) 6 - 23 mg/dL   Creatinine, Ser 7.29 (*) 0.50 - 1.10 mg/dL   Calcium 10.0  8.4 - 10.5 mg/dL   GFR calc non Af Amer 5 (*) >90 mL/min   GFR calc Af Amer 6 (*) >90 mL/min   Comment: (NOTE)     The eGFR has been calculated using the CKD EPI equation.     This calculation has not been validated in all clinical situations.     eGFR's persistently <90 mL/min signify possible Chronic Kidney     Disease.  Randolm Idol, ED     Status: None   Collection Time    11/19/13 10:16 AM      Result Value Ref Range   Troponin i, poc 0.03  0.00 - 0.08 ng/mL   Comment 3            Comment: Due to the release kinetics of cTnI,     a negative result within the first hours     of the onset of symptoms does not rule out     myocardial infarction with certainty.     If myocardial infarction is still suspected,     repeat the test at appropriate intervals.  CBG MONITORING, ED     Status: Abnormal   Collection Time    11/19/13  1:39 PM      Result Value Ref Range   Glucose-Capillary 131 (*) 70 -  99 mg/dL   ROS: only positives in hpi   Physical Exam: Filed Vitals:   11/19/13 1500  BP: 143/70  Pulse: 71  Temp:   Resp: 21     General: Alert sl. Obese bf NAD appropriate  HEENT: [place], MMM, nonicteric  Neck: pos. Jvd/ old rem scar in R ij area also/ faint carotid bruits bilat Heart: RRR 2/6/sem lsb, no rub Lungs: CTA bilat Abdomen: obese, BS =+, soft nt, nd Extremities: R 1= pedal edema/ L BKD stump no edema  Skin: R lower extrem . Venous stasis type skin changes no open ulcer  Neuro: alert OX3 moves all exterm , appropriate MS , no acute deficits noted  Dialysis Access: pos bruit L UA AVF  Dialysis Orders: [facility]: [facility] . EDW   HD Bath    Time   Heparin  . Access LUA AVF BFR   DFR      Zemplar   mcg IV/HD Epogen     Units IV/HD  Venofer     Other    Assessment/Plan 1. Recurrent cardioresp arrest= admit team wu/ Card eval/ 1st ce neg 2. ESRD -  MWF , hold hd tonight labs  Okay and vol stable/ HD tomorrow if stable   Reck labs pre hd in am / 3. Hypertension/volume  -  o2SAT 100% 2 l [place] only 2 kg over edw / uf 2 liter in am/  GET Standing wts   Hopefully but needs prothesis  Not avail in hosp now/ midodrine pre hd / bp meds 4. Anemia  - nhgb  11.4 no esa 5. Metabolic bone disease -  [place] and vit d /obtain kid center info 6. ASCVD - with known Severe L ICA stenosis   Ernest Haber, PA-C [place] [phone] 11/19/2013, 4:13 PM   Pt seen, examined and agree w A/P as above.  Difficult situation, patient with ESRD and recurrent resp/cardiac arrest occurring on dialysis. Prior w/u has shown severe L carotid disease and pt refused heart cath.  Was going to have attempted stenting of the ICA disease but didn't tolerate antiplatelet prep with UGIB.  Eventually patient decided not to pursue ICA procedure and just treat medically.  Unclear cause of arrest, has never had +CE or EKG changes other than associated bradycardia w the events.  Brainstem  hypoperfusion with resp arrest has been considered as possible cause and at outpatient HD there are orders to stop pulling fluid if BP drops below a certain threshold.  No new suggestions.  Have d/w primary.  Palliative care input would be helpful if not pursued already. Kelly Splinter MD pager [phone]    cell [phone] 11/19/2013, 5:15 PM

## 2013-11-19 NOTE — H&P (Signed)
[facility]  History and Physical  Briana Gould ZOX:[id number] DOB: Ardene 13, 1947 DOA: 11/19/2013  Referring physician: Dr. Micheline Maze  PCP: [name], MD  Chief Complaint: cardiac arrest  HPI: Briana Gould is a 68 y.o. female with pmh significant for DM type 2, PAD (s/p BKA), ESRD, GERD, CAD and two prior admissions (Jan and Feb) with cardiac arrest; came to hospital from HD center after experiencing another mild episode of cardiac arrest after initiating HD therapy. Patient denies any prodromic symptoms prior to episode and per reports she was noticed to have agonal breathing and subsequent pulseless episode. Patient received CPR and turned around with just chest compression. She was transferred to ED for further evaluation. While in ED patient hs been fine, VSS and denies any acute complaints, except for mild HA. CT head negative. [facility] called to admit patient for further evaluation and treatment.  Review of Systems:  Negative except as mentioned on HPI.  Past Medical History   Diagnosis  Date   .  PAD (peripheral artery disease)      a. L BKA   .  Type II diabetes mellitus    .  Anemia    .  History of blood transfusion  2002     "related to amputation" (08/13/2013)   .  End stage renal disease on dialysis      "[facility]; M, W, F" (08/13/2013)   .  DVT (deep venous thrombosis)      a. R IJ DVT 08/2013.   Marland Kitchen  CVA (cerebral infarction)      a. MRI 08/2013: possible left motor strip infarct with remote pontine infarct.   .  Pulmonary HTN      a. Cath 2011 - mod to severe pulmonary hypertension. b. Echo 08/2013: PAP (mild-mod increased).   .  Thrombocytopenia    .  CAD (coronary artery disease)      a. Severe diffuse diabetic disease by cath 2011, for med rx, not a candidate for CABG or PCI at that time.   .  Aortic stenosis    .  Syncope      a. 2011, etiology never clear.   .  Carotid artery disease      a. Dopp 81/1914: 1-39% LICA< 60-79% RICA  borderline >80%.   .  Cerebrovascular disease      a. 08/2013 MRA brain: multiple bilateral moderate and high grade intracranial stenoses   .  Cardiac arrest  08/14/13     a. Possible arrest in setting of hypotension during dialysis with possible arrest requiring brief CPR, not intubated and was fully intact after episode. Suspected due to low cerebral perfusion in setting of hypotension. Unclear if any arrhythmia.   .  End stage renal disease on dialysis    .  Peripheral arterial occlusive disease    .  Hypertension    .  Cardiac arrest    .  Diabetes mellitus type 2, uncontrolled, with complications    .  DVT (deep venous thrombosis)      Right internal jugular vein   .  CAD (coronary artery disease), native coronary artery  2011     Diffuse distal vessel CAD noted at cardiac catheterization   .  MI (myocardial infarction)     Past Surgical History   Procedure  Laterality  Date   .  Av fistula placement  Left  2011     upper arm   .  Below knee leg amputation  Left  2002   .  Tonsillectomy     .  Cataract extraction w/ intraocular lens implant, bilateral  Bilateral  2012-2014   .  Esophagogastroduodenoscopy  N/A  08/30/2013     Procedure: ESOPHAGOGASTRODUODENOSCOPY (EGD); Surgeon: Theda BelfastPatrick D Hung, MD; [facility]; Service: Endoscopy; Laterality: N/A;   .  Below knee leg amputation  Left    .  Av fistula placement  Left     Social History: reports that she has never smoked. She does not have any smokeless tobacco history on file. She reports that she does not drink alcohol or use illicit drugs.  Allergies   Allergen  Reactions   .  Phenergan [Promethazine Hcl]  Other (See Comments)     Causes patient to" vomit more"   .  Vitamin D Analogs  Rash    Family History   Problem  Relation  Age of Onset   .  Hypertension  Mother    .  Hypertension  Father    .  Heart attack  Brother    .  Stroke  Mother    .  Pulmonary embolism  Father     Prior to Admission medications    Medication  Sig  Start Date  End Date  Taking?  Authorizing Provider   b complex-vitamin c-folic acid (NEPHRO-VITE) 0.8 MG TABS tablet  Take 1 tablet by mouth daily.    Yes  Historical Provider, MD   famotidine (PEPCID) 20 MG tablet  Take 1 tablet (20 mg total) by mouth at bedtime.  10/25/13   Yes  Evlyn KannerPamela S Love, PA-C   glimepiride (AMARYL) 1 MG tablet  Take 1 tablet (1 mg total) by mouth daily with breakfast.  10/25/13   Yes  Evlyn KannerPamela S Love, PA-C   midodrine (PROAMATINE) 5 MG tablet  Take 1 tablet (5 mg total) by mouth every Monday, Wednesday, and Friday with hemodialysis.  10/25/13   Yes  Evlyn KannerPamela S Love, PA-C   Nutritional Supplements (FEEDING SUPPLEMENT, NEPRO CARB STEADY,) LIQD  Take 237 mLs by mouth daily.    Yes  Historical Provider, MD   pantoprazole (PROTONIX) 40 MG tablet  Take 1 tablet (40 mg total) by mouth 2 (two) times daily.  10/25/13   Yes  Evlyn KannerPamela S Love, PA-C   sevelamer carbonate (RENVELA) 800 MG tablet  Take 1,600 mg by mouth 3 (three) times daily with meals.    Yes  Historical Provider, MD   Physical Exam:  Filed Vitals:    11/19/13 1500   BP:  143/70   Pulse:  71   Temp:    Resp:  21    BP 143/70  Pulse 71  Temp(Src) 97.8 F (36.6 C) (Oral)  Resp 21  Ht 5\' 6"  (1.676 m)  Wt 81.7 kg (180 lb 1.9 oz)  BMI 29.09 kg/m2  SpO2 100%  General: Appears calm and comfortable, no fever  Eyes: PERRL, normal lids, irises & conjunctiva  ENT: grossly normal hearing, lips & tongue, no erythema or exudates  Neck: no LAD, masses or thyromegaly, no JVD  Cardiovascular: RRR, no rubs or gallops; positive SEM; 2 ++ edema RLE  Telemetry: SR, no arrhythmias appreciated on tele  Respiratory: CTA bilaterally, no w/r/r. Normal respiratory effort.  Abdomen: soft, nt, nd and positive BS  Skin: no rash or induration seen on exam  Musculoskeletal: grossly normal tone BUE/BLE; left BKA; 2++ edema  Psychiatric: grossly normal mood and affect, speech fluent and appropriate  Neurologic: grossly  non-focal.  Labs on Admission:  Basic Metabolic Panel:   Recent Labs  Lab  11/19/13 1003   NA  142   K  4.6   CL  99   CO2  25   GLUCOSE  182*   BUN  47*   CREATININE  7.29*   CALCIUM  10.0    CBC:   Recent Labs  Lab  11/19/13 1003   WBC  10.9*   NEUTROABS  8.8*   HGB  11.4*   HCT  35.3*   MCV  94.9   PLT  150    BNP (last 3 results)   Recent Labs   09/20/13 0913  09/20/13 1155   PROBNP  57457.0*  47172.0*    CBG:   Recent Labs  Lab  11/19/13 1339   GLUCAP  131*    Radiological Exams on Admission:  Ct Head Wo Contrast  11/19/2013 CLINICAL DATA: Cardiac arrest during dialysis. Vomiting after resuscitation. Headache. EXAM: CT HEAD WITHOUT CONTRAST TECHNIQUE: Contiguous axial images were obtained from the base of the skull through the vertex without intravenous contrast. COMPARISON: IR ANGIO VERTEBRAL SEL SUBCLAVIAN INNOMINATE BILAT MOD SED dated 09/28/2013; CT HEAD W/O CM dated 09/20/2013 FINDINGS: Focal central pontine hypodensity, image 10 of series 2, stable, most compatible with remote lacunar infarct. Otherwise, the brainstem, cerebellum, cerebral peduncles, thalamus, basal ganglia, basilar cisterns, and ventricular system appear within normal limits. No intracranial hemorrhage, mass lesion, or acute CVA. There is atherosclerotic calcification of the cavernous carotid arteries bilaterally. IMPRESSION: 1. Small remote lacunar infarct in the mid pons. No acute intracranial findings. Electronically Signed By: Herbie Baltimore M.D. On: 11/19/2013 11:13  Dg Chest Port 1 View  11/19/2013 CLINICAL DATA: Loss of consciousness EXAM: PORTABLE CHEST - 1 VIEW COMPARISON: September 25, 2013 FINDINGS: The heart size and mediastinal contours are stable. The heart size is enlarged. There is pulmonary edema. There is no focal pneumonia or pleural effusion. The visualized skeletal structures are stable. IMPRESSION: Congestive heart failure. Electronically Signed By: Sherian Rein  M.D. On: 11/19/2013 09:57   EKG:  Ordered and pending  Assessment/Plan  1-Cardiac arrest: exact etiology remains unclear. This is patient third cardiac arrest in the last 3 months.  -this time patient was able to recover after just some CP and her VS are now stable, same as mentation. Patient denies any prodromic symptoms.  -will admit to stepdown for close observation; check TSH, Mg, Phosphorus and cycle troponin  -will provide supportive care and follow rec's from cardiology service (plan from previous admissions included decide on cardiac cath and pace maker implantation)  -avoid any agent that can slow doen her heart  -be permissive with HTN  2-DM: will check A1C; start SSI  3-ESRD (end stage renal disease) on dialysis: per renal service.  -plan is for HD tomorrow.  -Potassium WNL  4-Anemia of chronic renal failure: per renal.  -Hgb stable.  5-Paroxysmal a-fib: rate controlled and currently in sinus rhythm.  -will monitor on telemetry  6-PAD: status post left BKA. Stump is intact and without any open wounds or drainage  7-GERD: continue PPI  Renal service  Cardiology  Code Status: Full  Family Communication: daughter [name]) at bedside  Disposition Plan: inpatient, LOS > 2 midnights, stepdown  Time spent: 50 minutes  Armen Pickup  [facility]  Pager [phone]

## 2013-11-20 ENCOUNTER — Encounter (HOSPITAL_COMMUNITY): Payer: Self-pay | Admitting: Nephrology

## 2013-11-20 DIAGNOSIS — N189 Chronic kidney disease, unspecified: Secondary | ICD-10-CM

## 2013-11-20 DIAGNOSIS — I359 Nonrheumatic aortic valve disorder, unspecified: Secondary | ICD-10-CM

## 2013-11-20 DIAGNOSIS — N039 Chronic nephritic syndrome with unspecified morphologic changes: Secondary | ICD-10-CM

## 2013-11-20 DIAGNOSIS — K922 Gastrointestinal hemorrhage, unspecified: Secondary | ICD-10-CM

## 2013-11-20 DIAGNOSIS — I2510 Atherosclerotic heart disease of native coronary artery without angina pectoris: Secondary | ICD-10-CM

## 2013-11-20 DIAGNOSIS — D631 Anemia in chronic kidney disease: Secondary | ICD-10-CM

## 2013-11-20 DIAGNOSIS — I498 Other specified cardiac arrhythmias: Secondary | ICD-10-CM

## 2013-11-20 DIAGNOSIS — I739 Peripheral vascular disease, unspecified: Secondary | ICD-10-CM

## 2013-11-20 DIAGNOSIS — I469 Cardiac arrest, cause unspecified: Principal | ICD-10-CM

## 2013-11-20 DIAGNOSIS — I6350 Cerebral infarction due to unspecified occlusion or stenosis of unspecified cerebral artery: Secondary | ICD-10-CM

## 2013-11-20 LAB — CBC
HCT: 35 % — ABNORMAL LOW (ref 36.0–46.0)
HEMOGLOBIN: 11.3 g/dL — AB (ref 12.0–15.0)
MCH: 30.9 pg (ref 26.0–34.0)
MCHC: 32.3 g/dL (ref 30.0–36.0)
MCV: 95.6 fL (ref 78.0–100.0)
PLATELETS: 139 10*3/uL — AB (ref 150–400)
RBC: 3.66 MIL/uL — ABNORMAL LOW (ref 3.87–5.11)
RDW: 16.7 % — AB (ref 11.5–15.5)
WBC: 7.6 10*3/uL (ref 4.0–10.5)

## 2013-11-20 LAB — TSH: TSH: 0.663 u[IU]/mL (ref 0.350–4.500)

## 2013-11-20 LAB — BASIC METABOLIC PANEL
BUN: 61 mg/dL — AB (ref 6–23)
CALCIUM: 10.1 mg/dL (ref 8.4–10.5)
CO2: 23 mEq/L (ref 19–32)
CREATININE: 8.26 mg/dL — AB (ref 0.50–1.10)
Chloride: 95 mEq/L — ABNORMAL LOW (ref 96–112)
GFR, EST AFRICAN AMERICAN: 5 mL/min — AB (ref >90)
GFR, EST NON AFRICAN AMERICAN: 4 mL/min — AB (ref >90)
Glucose, Bld: 98 mg/dL (ref 70–99)
POTASSIUM: 5.4 meq/L — AB (ref 3.7–5.3)
Sodium: 135 mEq/L — ABNORMAL LOW (ref 137–147)

## 2013-11-20 LAB — GLUCOSE, CAPILLARY
Glucose-Capillary: 110 mg/dL — ABNORMAL HIGH (ref 70–99)
Glucose-Capillary: 156 mg/dL — ABNORMAL HIGH (ref 70–99)
Glucose-Capillary: 89 mg/dL (ref 70–99)

## 2013-11-20 LAB — TROPONIN I: Troponin I: <0.3 ng/mL (ref <0.30)

## 2013-11-20 MED ORDER — SODIUM CHLORIDE 0.9 % IV SOLN: 250.0000 mL | INTRAVENOUS | Status: DC | PRN

## 2013-11-20 MED ORDER — SODIUM CHLORIDE 0.9 % IJ SOLN
3.0000 mL | Freq: Two times a day (BID) | INTRAMUSCULAR | Status: DC
  Administered 2013-11-20 – 2013-11-21 (×3): 3 mL via INTRAVENOUS

## 2013-11-20 MED ORDER — ASPIRIN 81 MG PO CHEW
81.0000 mg | CHEWABLE_TABLET | ORAL | Status: AC
  Administered 2013-11-22: 81 mg via ORAL
  Filled 2013-11-20: qty 1

## 2013-11-20 MED ORDER — SODIUM CHLORIDE 0.9 % IJ SOLN: 3.0000 mL | INTRAMUSCULAR | Status: DC | PRN

## 2013-11-20 NOTE — Progress Notes (Addendum)
  Briana Gould Progress Note   Subjective: No complaints  Filed Vitals:   11/19/13 2300 11/20/13 0000 11/20/13 0400 11/20/13 0811  BP:  175/60 130/55 193/63  Pulse:  65 74 86  Temp: 98.2 F (36.8 C)  98.7 F (37.1 C) 98.7 F (37.1 C)  TempSrc: Oral  Oral Oral  Resp:  20 17 13   Height:      Weight:   80.2 kg (176 lb 12.9 oz)   SpO2:    96%   Exam: Alert, up in bed, no distress, calm No jvd Chest is clear bilat RRR soft 2/6 SEM no RG Abd soft, nt, nd, no ascites R leg 2+ stable pitting edema, L BKA intact Neuro is nf, ox3 LUA AVF +bruit  Dialysis: MWF [place] 4h   80kg   300/800   Buttonhole lower part AVF or sharp needle top section   Heparin none EPO 7600  Assessment/Plan  1. Recurrent cardioresp arrest- unclear cause, refused heart cath last admission 2. ESRD on hemodialysis 3. Hypertension/volume- permissive HTN and we are maintaining some vol excess due to #1, at dry wt today 4. Anemia Hb 11.4, hold epo 5. Metabolic bone disease - Binder and vit d 6. ASCVD - with known severe L ICA stenosis and occluded [place] 7. Hx recent GIB- no hep with HD due to this   Plan- HD Monday. Spoke with pt , she is agreeable now to invasive procedures including heart cath if needed to investigate cause of recurrent arrest.     Vinson Moselleob Bentlee Drier MD  pager [phone]    cell [phone]  11/20/2013, 12:33 PM     Recent Labs Lab 11/19/13 1003 11/19/13 1645 11/20/13 0350  NA 142  --  135*  K 4.6  --  5.4*  CL 99  --  95*  CO2 25  --  23  GLUCOSE 182*  --  98  BUN 47*  --  61*  CREATININE 7.29* 7.53* 8.26*  CALCIUM 10.0  --  10.1  PHOS  --  3.4  --    No results found for this basename: AST, ALT, ALKPHOS, BILITOT, PROT, ALBUMIN,  in the last 168 hours  Recent Labs Lab 11/19/13 1003 11/19/13 1645 11/20/13 0350  WBC 10.9* 7.6 7.6  NEUTROABS 8.8*  --   --   HGB 11.4* 12.2 11.3*  HCT 35.3* 37.3 35.0*  MCV 94.9 94.7 95.6  PLT 150 164 139*   .  famotidine  20 mg Oral QHS  . feeding supplement (NEPRO CARB STEADY)  237 mL Oral Daily  . heparin  5,000 Units Subcutaneous 3 times per day  . insulin aspart  0-9 Units Subcutaneous TID WC  . [START ON 11/22/2013] midodrine  5 mg Oral Q M,W,F-HD  . multivitamin  1 tablet Oral QHS  . pantoprazole  40 mg Oral BID  . prednisoLONE acetate  1 drop Left Eye TID  . sevelamer carbonate  1,600 mg Oral TID WC     acetaminophen, acetaminophen, morphine injection, ondansetron (ZOFRAN) IV, ondansetron

## 2013-11-20 NOTE — Progress Notes (Signed)
Nutrition Brief Note  Patient identified on the Malnutrition Screening Tool (MST) Report  Wt Readings from Last 5 Encounters:  11/20/13 176 lb 12.9 oz (80.2 kg)  10/25/13 175 lb 4.3 oz (79.5 kg)  10/15/13 178 lb 12.7 oz (81.1 kg)  10/15/13 178 lb 12.7 oz (81.1 kg)  10/15/13 178 lb 12.7 oz (81.1 kg)    Body mass index is 28.55 kg/(m^2). Patient meets criteria for overweight based on current BMI.   Current diet order is renal/CHO modified with 1.2L fluid restriction, patient is consuming approximately 50% of meals at this time. Labs and medications reviewed. Sodium low, potassium elevated, BUN/Cr elevated with very low GFR. Pt with pmh significant for DM type 2, PAD (s/p left BKA), ESRD, GERD, CAD and two prior admissions (Jan and Feb) with cardiac arrest; came to hospital from HD center after experiencing another mild episode of cardiac arrest after initiating HD therapy.   Met with pt who reports eating well with good appetite and stable weight PTA. Drinks 1 Nepro/day at home, will order for admission. Had questions reviewing renal diet that were reviewed and handouts provided.   No nutrition interventions warranted at this time. If nutrition issues arise, please consult RD.   Mikey College MS, RD, LDN [phone] Weekend/After Hours Pager

## 2013-11-20 NOTE — Consult Note (Addendum)
Primary Physician: Primary Cardiologist:   HPI: paeint is a 68 yo who we are asked to see re cardiac arrest, CAD  The patient has been admitted x 2  (Jan, Feb) with cardiac arrest (? PEA)   from hemodialysis.   Patient with known CAD by cath in 2011  Diffuse disease at that time, not felt amenable to intervention (surgery or PCI)   Note on last admission (Feb) echo done.  LVEF was 55%.  Pateint also had carotid dopplers that showed 50 to 79% R ICA.  Angiogram done that showed 95% L ICA stenosis.  Started on APT  Developed afib then hematemesis. Cardiology saw patient  Recomm L heart cath to define anatomy but patient declined (10/13/13 discussion with P Nishan.  SHe DId not wish to pursue cath.  Would consider palliative care)  Yesterday, patient went for hemodialysis  Just started. Patient say she has nausea develop.  Patient Then reported to have  agonal breathing and then pulseless episodes  Has received CPR  QUick response  [facility]  On arival denied CP  No SOB  Mild HA.  Now comfortable  No nausea  Undergoing dialysis.   Deneis CP  Breathing is stable  She says her BP used to be higher before dialysis  Now low  On review of hospital records, patient also seen by EP service in Dec for bradycardia.  Felt to be a poor candidate for PPM.  B BLocker held and HR improved.        Past Medical History  Diagnosis Date  . PAD (peripheral artery disease)     a. L BKA  . Type II diabetes mellitus   . Anemia   . History of blood transfusion 2002    "related to amputation" (08/13/2013)  . End stage renal disease on dialysis     [facility]; M, W, F" (08/13/2013)  . DVT (deep venous thrombosis)     a. R IJ DVT 08/2013.  Marland Kitchen. CVA (cerebral infarction)     a. MRI 08/2013: possible left motor strip infarct with remote pontine infarct.  . Pulmonary HTN     a. Cath 2011 - mod to severe pulmonary hypertension. b. Echo 08/2013: PAP 45mmHg (mild-mod increased).  .  Thrombocytopenia   . CAD (coronary artery disease)     a. Severe diffuse diabetic disease by cath 2011, for med rx, not a candidate for CABG or PCI at that time.  . Aortic stenosis   . Syncope     a. 2011, etiology never clear.  . Carotid artery disease     a. Dopp 16/109612/2014: 1-39% LICA< 60-79% RICA borderline >80%.  . Cerebrovascular disease     a. 08/2013 MRA brain: multiple bilateral moderate and high grade intracranial stenoses  . Cardiac arrest 08/14/13    a. Possible arrest in setting of hypotension during dialysis with possible arrest requiring brief CPR, not intubated and was fully intact after episode. Suspected due to low cerebral perfusion in setting of hypotension. Unclear if any arrhythmia.  . End stage renal disease on dialysis   . Peripheral arterial occlusive disease   . Hypertension   . Cardiac arrest   . Diabetes mellitus type 2, uncontrolled, with complications   . DVT (deep venous thrombosis)      Right internal jugular vein  . CAD (coronary artery disease), native coronary artery 2011    Diffuse distal vessel CAD noted at cardiac catheterization  . MI (myocardial infarction)   .  Stroke     Medications Prior to Admission  Medication Sig Dispense Refill  . b complex-vitamin c-folic acid (NEPHRO-VITE) 0.8 MG TABS tablet Take 1 tablet by mouth daily.      . famotidine (PEPCID) 20 MG tablet Take 1 tablet (20 mg total) by mouth at bedtime.  30 tablet  1  . glimepiride (AMARYL) 1 MG tablet Take 1 tablet (1 mg total) by mouth daily with breakfast.  30 tablet  1  . midodrine (PROAMATINE) 5 MG tablet Take 1 tablet (5 mg total) by mouth every Monday, Wednesday, and Friday with hemodialysis.  30 tablet  1  . Nutritional Supplements (FEEDING SUPPLEMENT, NEPRO CARB STEADY,) LIQD Take 237 mLs by mouth daily.      . pantoprazole (PROTONIX) 40 MG tablet Take 1 tablet (40 mg total) by mouth 2 (two) times daily.  60 tablet  1  . sevelamer carbonate (RENVELA) 800 MG tablet Take 1,600  mg by mouth 3 (three) times daily with meals.          . famotidine  20 mg Oral QHS  . feeding supplement (NEPRO CARB STEADY)  237 mL Oral Daily  . heparin  5,000 Units Subcutaneous 3 times per day  . insulin aspart  0-9 Units Subcutaneous TID WC  . [START ON 11/22/2013] midodrine  5 mg Oral Q M,W,F-HD  . multivitamin  1 tablet Oral QHS  . pantoprazole  40 mg Oral BID  . prednisoLONE acetate  1 drop Left Eye TID  . sevelamer carbonate  1,600 mg Oral TID WC    Infusions:    Allergies  Allergen Reactions  . Phenergan [Promethazine Hcl] Other (See Comments)    Causes patient to" vomit more"  . Vitamin D Analogs Rash    History   Social History  . Marital Status: Married    Spouse Name: N/A    Number of Children: N/A  . Years of Education: N/A   Occupational History  . Not on file.   Social History Main Topics  . Smoking status: Never Smoker   . Smokeless tobacco: Not on file  . Alcohol Use: No  . Drug Use: No  . Sexual Activity: Not Currently   Other Topics Concern  . Not on file   Social History Narrative   ** Merged History Encounter **       Nonsmoker, no alcohol. Supportive family.    Family History  Problem Relation Age of Onset  . Hypertension Mother   . Hypertension Father   . Heart attack Brother   . Stroke Mother   . Pulmonary embolism Father     REVIEW OF SYSTEMS:  All systems reviewed  Negative to the above problem except as noted above.    PHYSICAL EXAM: Filed Vitals:   11/20/13 0811  BP: 193/63  Pulse: 86  Temp: 98.7 F (37.1 C)  Resp: 13    No intake or output data in the 24 hours ending 11/20/13 1055  General:  Patient is a 68 yo in NAD HEENT: normal Neck: supple. no JVD. Carotids 2+ bilat; no bruits. No  thryomegaly appreciated. Cor: PMI nondisplaced. Regular rate & rhythm. No rubs, gallops or murmurs. Lungs: clear Abdomen: soft, nontender, nondistended. No hepatosplenomegaly. No bruits or masses. Good bowel  sounds. Extremities: s/p L BKA  1 + RLE edema   Neuro: alert & oriented x 3, cranial nerves grossly intact. moves all 4 extremities w/o difficulty. Affect pleasant.  ECG:  SR 78  LBBB  Results for orders placed during the hospital encounter of 11/19/13 (from the past 24 hour(s))  CBG MONITORING, ED     Status: Abnormal   Collection Time    11/19/13  1:39 PM      Result Value Ref Range   Glucose-Capillary 131 (*) 70 - 99 mg/dL  TROPONIN I     Status: None   Collection Time    11/19/13  3:51 PM      Result Value Ref Range   Troponin I <0.30  <0.30 ng/mL  MRSA PCR SCREENING     Status: None   Collection Time    11/19/13  3:55 PM      Result Value Ref Range   MRSA by PCR NEGATIVE  NEGATIVE  GLUCOSE, CAPILLARY     Status: Abnormal   Collection Time    11/19/13  4:43 PM      Result Value Ref Range   Glucose-Capillary 125 (*) 70 - 99 mg/dL  CBC     Status: Abnormal   Collection Time    11/19/13  4:45 PM      Result Value Ref Range   WBC 7.6  4.0 - 10.5 K/uL   RBC 3.94  3.87 - 5.11 MIL/uL   Hemoglobin 12.2  12.0 - 15.0 g/dL   HCT 16.1  09.6 - 04.5 %   MCV 94.7  78.0 - 100.0 fL   MCH 31.0  26.0 - 34.0 pg   MCHC 32.7  30.0 - 36.0 g/dL   RDW 40.9 (*) 81.1 - 91.4 %   Platelets 164  150 - 400 K/uL  CREATININE, SERUM     Status: Abnormal   Collection Time    11/19/13  4:45 PM      Result Value Ref Range   Creatinine, Ser 7.53 (*) 0.50 - 1.10 mg/dL   GFR calc non Af Amer 5 (*) >90 mL/min   GFR calc Af Amer 6 (*) >90 mL/min  MAGNESIUM     Status: None   Collection Time    11/19/13  4:45 PM      Result Value Ref Range   Magnesium 2.4  1.5 - 2.5 mg/dL  PHOSPHORUS     Status: None   Collection Time    11/19/13  4:45 PM      Result Value Ref Range   Phosphorus 3.4  2.3 - 4.6 mg/dL  HEMOGLOBIN N8G     Status: Abnormal   Collection Time    11/19/13  4:45 PM      Result Value Ref Range   Hemoglobin A1C 5.7 (*) <5.7 %   Mean Plasma Glucose 117 (*) <117 mg/dL  GLUCOSE, CAPILLARY      Status: Abnormal   Collection Time    11/19/13  9:46 PM      Result Value Ref Range   Glucose-Capillary 152 (*) 70 - 99 mg/dL  TROPONIN I     Status: None   Collection Time    11/20/13  3:50 AM      Result Value Ref Range   Troponin I <0.30  <0.30 ng/mL  BASIC METABOLIC PANEL     Status: Abnormal   Collection Time    11/20/13  3:50 AM      Result Value Ref Range   Sodium 135 (*) 137 - 147 mEq/L   Potassium 5.4 (*) 3.7 - 5.3 mEq/L   Chloride 95 (*) 96 - 112 mEq/L   CO2 23  19 - 32 mEq/L  Glucose, Bld 98  70 - 99 mg/dL   BUN 61 (*) 6 - 23 mg/dL   Creatinine, Ser 6.96 (*) 0.50 - 1.10 mg/dL   Calcium 29.5  8.4 - 28.4 mg/dL   GFR calc non Af Amer 4 (*) >90 mL/min   GFR calc Af Amer 5 (*) >90 mL/min  CBC     Status: Abnormal   Collection Time    11/20/13  3:50 AM      Result Value Ref Range   WBC 7.6  4.0 - 10.5 K/uL   RBC 3.66 (*) 3.87 - 5.11 MIL/uL   Hemoglobin 11.3 (*) 12.0 - 15.0 g/dL   HCT 13.2 (*) 44.0 - 10.2 %   MCV 95.6  78.0 - 100.0 fL   MCH 30.9  26.0 - 34.0 pg   MCHC 32.3  30.0 - 36.0 g/dL   RDW 72.5 (*) 36.6 - 44.0 %   Platelets 139 (*) 150 - 400 K/uL  GLUCOSE, CAPILLARY     Status: Abnormal   Collection Time    11/20/13  8:15 AM      Result Value Ref Range   Glucose-Capillary 110 (*) 70 - 99 mg/dL   Ct Head Wo Contrast  11/19/2013   CLINICAL DATA:  Cardiac arrest during dialysis. Vomiting after resuscitation. Headache.  EXAM: CT HEAD WITHOUT CONTRAST  TECHNIQUE: Contiguous axial images were obtained from the base of the skull through the vertex without intravenous contrast.  COMPARISON:  IR ANGIO VERTEBRAL SEL SUBCLAVIAN INNOMINATE BILAT MOD SED dated 09/28/2013; CT HEAD W/O CM dated 09/20/2013  FINDINGS: Focal central pontine hypodensity, image 10 of series 2, stable, most compatible with remote lacunar infarct.  Otherwise, the brainstem, cerebellum, cerebral peduncles, thalamus, basal ganglia, basilar cisterns, and ventricular system appear within normal  limits. No intracranial hemorrhage, mass lesion, or acute CVA. There is atherosclerotic calcification of the cavernous carotid arteries bilaterally.  IMPRESSION: 1. Small remote lacunar infarct in the mid pons. No acute intracranial findings.   Electronically Signed   By: Herbie Baltimore M.D.   On: 11/19/2013 11:13   Dg Chest Port 1 View  11/19/2013   CLINICAL DATA:  Loss of consciousness  EXAM: PORTABLE CHEST - 1 VIEW  COMPARISON:  September 25, 2013  FINDINGS: The heart size and mediastinal contours are stable. The heart size is enlarged. There is pulmonary edema. There is no focal pneumonia or pleural effusion. The visualized skeletal structures are stable.  IMPRESSION: Congestive heart failure.   Electronically Signed   By: Sherian Rein M.D.   On: 11/19/2013 09:57     ASSESSMENT:  Patient is a 68 yo with multiple medical problems  Now with another admission for cardiac event at dialysis  Underwent CPR with prompt response.  She notes nausea prior She was seen on last admission and cath ws recomm  She declned.   I think, given continued need for dialysis, it is important to find out etiol for events.  WOuld reocmm L heart cath to define anatomy. Plan for Monday.   If anatomy uncnanged may reflect hypervagal respnse, esp gien history of bradycardia   Afib  PAF  FOllow on tele.  Not a good candidate for anticoag  Bradycardia  Follow on tele  CV disease  Severe  Will need to be addressed at some pt  ESRD  Continue dialysis.  DM  Per primary service.    GI  Hx GI bleed  WIll need to follow   Dietrich Pates

## 2013-11-20 NOTE — Progress Notes (Signed)
[facility] PROGRESS NOTE  Briana Gould HDQ:[id number] DOB: 1946-03-21 DOA: 11/19/2013 PCP: [name], MD  Assessment/Plan: 1-Cardiac arrest: exact etiology remains unclear. This is patient third cardiac arrest in the last 3 months.  -this time patient was able to recover after just some Chest compression and her VS has remains stable, same as mentation. Patient denies any prodromic symptoms.  -no overnight events -will follow cardiology rec's for decision on needs for cath and pacemaker (previously discussed with patient) -avoid any agent that can slow down her heart  -be permissive with HTN  -continue supportive care  2-DM: using amaryl as an outpatient -A1C 5.7 -continue SSI and low carb diet  3-ESRD (end stage renal disease) on dialysis: per renal service.  -plan is for HD today (3/14).  -Potassium slightly elevated today 5.6  4-Anemia of chronic renal failure: per renal.  -Hgb stable.   5-Paroxysmal a-fib: rate controlled and currently in sinus rhythm.  -will monitor on telemetry   6-PAD: status post left BKA. Stump is intact and without any open wounds or drainage   7-GERD and hx of GIB: continue PPI   8-hyperparathyroidism:  -per renal service -continue renvela  9-hyperkalemia: to be corrected with HD today.  Code Status: Full Family Communication: daughter at bedside  Disposition Plan: will transfer to telemetry bed    Consultants:  Cardiology  Renal service   Procedures:  See below for x-ray reports  Antibiotics:  None   HPI/Subjective: Afebrile; feeling ok. No events overnight. Patient HR has remained stable.  Objective: Filed Vitals:   11/20/13 0811  BP: 193/63  Pulse: 86  Temp: 98.7 F (37.1 C)  Resp: 13   No intake or output data in the 24 hours ending 11/20/13 1012 Filed Weights   11/19/13 0934 11/19/13 0943 11/20/13 0400  Weight: 80.74 kg (178 lb) 81.7 kg (180 lb 1.9 oz) 80.2 kg (176 lb 12.9 oz)     Exam:   General:  NAD, afebrile. No events overnight  Cardiovascular: S1 and S2, no rubs or gallops. Positive SEM  Respiratory: CTA bilaterally  Abdomen: soft, NT, ND, positive BS  Musculoskeletal: Left BKA, 1-2++ edema RLE  Data Reviewed: Basic Metabolic Panel:  Recent Labs Lab 11/19/13 1003 11/19/13 1645 11/20/13 0350  NA 142  --  135*  K 4.6  --  5.4*  CL 99  --  95*  CO2 25  --  23  GLUCOSE 182*  --  98  BUN 47*  --  61*  CREATININE 7.29* 7.53* 8.26*  CALCIUM 10.0  --  10.1  MG  --  2.4  --   PHOS  --  3.4  --    CBC:  Recent Labs Lab 11/19/13 1003 11/19/13 1645 11/20/13 0350  WBC 10.9* 7.6 7.6  NEUTROABS 8.8*  --   --   HGB 11.4* 12.2 11.3*  HCT 35.3* 37.3 35.0*  MCV 94.9 94.7 95.6  PLT 150 164 139*   Cardiac Enzymes:  Recent Labs Lab 11/19/13 1551 11/20/13 0350  TROPONINI <0.30 <0.30   BNP (last 3 results)  Recent Labs  09/20/13 0913 09/20/13 1155  PROBNP 57457.0* 47172.0*   CBG:  Recent Labs Lab 11/19/13 1339 11/19/13 1643 11/19/13 2146 11/20/13 0815  GLUCAP 131* 125* 152* 110*    Recent Results (from the past 240 hour(s))  MRSA PCR SCREENING     Status: None   Collection Time    11/19/13  3:55 PM      Result Value Ref Range  Status   MRSA by PCR NEGATIVE  NEGATIVE Final   Comment:            The GeneXpert MRSA Assay (FDA     approved for NASAL specimens     only), is one component of a     comprehensive MRSA colonization     surveillance program. It is not     intended to diagnose MRSA     infection nor to guide or     monitor treatment for     MRSA infections.     Studies: Ct Head Wo Contrast  11/19/2013   CLINICAL DATA:  Cardiac arrest during dialysis. Vomiting after resuscitation. Headache.  EXAM: CT HEAD WITHOUT CONTRAST  TECHNIQUE: Contiguous axial images were obtained from the base of the skull through the vertex without intravenous contrast.  COMPARISON:  IR ANGIO VERTEBRAL SEL SUBCLAVIAN INNOMINATE BILAT  MOD SED dated 09/28/2013; CT HEAD W/O CM dated 09/20/2013  FINDINGS: Focal central pontine hypodensity, image 10 of series 2, stable, most compatible with remote lacunar infarct.  Otherwise, the brainstem, cerebellum, cerebral peduncles, thalamus, basal ganglia, basilar cisterns, and ventricular system appear within normal limits. No intracranial hemorrhage, mass lesion, or acute CVA. There is atherosclerotic calcification of the cavernous carotid arteries bilaterally.  IMPRESSION: 1. Small remote lacunar infarct in the mid pons. No acute intracranial findings.   Electronically Signed   By: Herbie BaltimoreWalt  Liebkemann M.D.   On: 11/19/2013 11:13   Dg Chest Port 1 View  11/19/2013   CLINICAL DATA:  Loss of consciousness  EXAM: PORTABLE CHEST - 1 VIEW  COMPARISON:  September 25, 2013  FINDINGS: The heart size and mediastinal contours are stable. The heart size is enlarged. There is pulmonary edema. There is no focal pneumonia or pleural effusion. The visualized skeletal structures are stable.  IMPRESSION: Congestive heart failure.   Electronically Signed   By: Sherian ReinWei-Chen  Lin M.D.   On: 11/19/2013 09:57    Scheduled Meds: . famotidine  20 mg Oral QHS  . feeding supplement (NEPRO CARB STEADY)  237 mL Oral Daily  . heparin  5,000 Units Subcutaneous 3 times per day  . insulin aspart  0-9 Units Subcutaneous TID WC  . [START ON 11/22/2013] midodrine  5 mg Oral Q M,W,F-HD  . multivitamin  1 tablet Oral QHS  . pantoprazole  40 mg Oral BID  . prednisoLONE acetate  1 drop Left Eye TID  . sevelamer carbonate  1,600 mg Oral TID WC   Continuous Infusions:   Principal Problem:   Cardiac arrest Active Problems:   DM   ESRD (end stage renal disease) on dialysis   Anemia of chronic renal failure   Paroxysmal a-fib    Time spent: >30 minutes    Armen PickupManzueta, Denene Alamillo E Antwaine Boomhower  [facility] Pager [phone]. If 7PM-7AM, please contact night-coverage at [email or web address], password [email or web address] 11/20/2013, 10:12 AM  LOS: 1 day

## 2013-11-21 DIAGNOSIS — N186 End stage renal disease: Secondary | ICD-10-CM

## 2013-11-21 DIAGNOSIS — I10 Essential (primary) hypertension: Secondary | ICD-10-CM

## 2013-11-21 DIAGNOSIS — I4891 Unspecified atrial fibrillation: Secondary | ICD-10-CM

## 2013-11-21 DIAGNOSIS — I679 Cerebrovascular disease, unspecified: Secondary | ICD-10-CM

## 2013-11-21 LAB — GLUCOSE, CAPILLARY
Glucose-Capillary: 159 mg/dL — ABNORMAL HIGH (ref 70–99)
Glucose-Capillary: 184 mg/dL — ABNORMAL HIGH (ref 70–99)
Glucose-Capillary: 140 mg/dL — ABNORMAL HIGH (ref 70–99)
Glucose-Capillary: 163 mg/dL — ABNORMAL HIGH (ref 70–99)

## 2013-11-21 NOTE — Progress Notes (Signed)
Subjective: No SOB or CP   Objective: Filed Vitals:   11/20/13 1857 11/20/13 2040 11/20/13 2135 11/21/13 0610  BP: 170/67  162/71 148/67  Pulse: 91  77 69  Temp: 98.7 F (37.1 C)  98.8 F (37.1 C) 98.6 F (37 C)  TempSrc: Oral  Oral Oral  Resp: 18  18 20  Height:      Weight:  175 lb 7.8 oz (79.6 kg)    SpO2: 100%  98% 100%   Weight change: -2 lb 8.2 oz (-1.14 kg)  Intake/Output Summary (Last 24 hours) at 11/21/13 0734 Last data filed at 11/21/13 0651  Gross per 24 hour  Intake    420 ml  Output    500 ml  Net    -80 ml    General: Alert, awake, oriented x3, in no acute distress Neck:  JVP is normal Heart: Regular rate and rhythm, without murmurs, rubs, gallops.  Lungs: Clear to auscultation.  No rales or wheezes. Exemities:  No edema.   Neuro: Grossly intact, nonfocal.   Lab Results: Results for orders placed during the hospital encounter of 11/19/13 (from the past 24 hour(s))  GLUCOSE, CAPILLARY     Status: Abnormal   Collection Time    11/20/13  8:15 AM      Result Value Ref Range   Glucose-Capillary 110 (*) 70 - 99 mg/dL  GLUCOSE, CAPILLARY     Status: Abnormal   Collection Time    11/20/13 12:35 PM      Result Value Ref Range   Glucose-Capillary 156 (*) 70 - 99 mg/dL  GLUCOSE, CAPILLARY     Status: None   Collection Time    11/20/13  7:04 PM      Result Value Ref Range   Glucose-Capillary 89  70 - 99 mg/dL    Studies/Results: @RISRSLT24@  Medications:  Reviewed   @PROBHOSP@  1.  Cardiac arrest.  Patient has not had a few events at dialysis requiring CPR  Seen last admit  Refused L heart cath to define anatomy.  Now amenable.   Plan R and L heart cath in AM (Echo in past sugg pulm HTN).  Plan for AM  2.  CAD  Diffuse CAD  Last cath in 2011  3.  CV dz  Severe  WIll need eval in future  4.  ESRD  Will get dialysis after    LOS: 2 days   Paula Ross 11/21/2013, 7:34 AM   

## 2013-11-21 NOTE — Progress Notes (Signed)
Subjective:   No cos. tolerated hd yesterday  Only 500 cc uf/noted for Card cath in am Objective Vital signs in last 24 hours: Filed Vitals:   11/20/13 2040 11/20/13 2135 11/21/13 0610 11/21/13 0840  BP:  162/71 148/67 194/93  Pulse:  77 69 119  Temp:  98.8 F (37.1 C) 98.6 F (37 C) 98.6 F (37 C)  TempSrc:  Oral Oral Oral  Resp:  18 20 18   Height:      Weight: 79.6 kg (175 lb 7.8 oz)     SpO2:  98% 100% 100%   Exam:  Alert, sitting  up in bed, calm nad  No jvd  Chest  CTA bilat  RRR soft 2/6 SEM no RG  Abd  Obese,soft, nt, R leg 1 to 2+ stable pitting edema, L BKA prothesis on    LUA AVF +bruit  Dialysis: MWF [facility] 4h 80kg 300/800 Buttonhole lower part AVF or sharp needle top section Heparin none  EPO 7600   Assessment/Plan  1. Recurrent cardioresp arrest- unclear cause,  Agrees to am heart cath 2. ESRD on hemodialysis normal schedule MWF 3. Hypertension/volume- permissive HTN  Only 500 cc uf yesterday and we are maintaining some vol excess with pedal edema due to #1, at dry wt today/ Midodrine 5 mg pre hd 4. Anemia Hb 11.4,>12.2>11.3 hold epo 5. Metabolic bone disease -phos 3.4 ca 10.1 onRenvela Binder and vit d 6. ASCVD - with known severe L ICA stenosis and occluded [place] and CAD per  Prior cath 2011 7. Hx recent GIB- no hep with HD due to this  Plan- HD Monday. Now she is agreeable to invasive procedures including heart cath if needed to investigate cause of recurrent arrest.  Lenny Pastel, PA-C [facility] Beeper [phone] 11/21/2013,10:03 AM  LOS: 2 days   Pt seen, examined, agree w assess/plan as above with additions as indicated. Cardiology recommending R and L heart cath, prob tomorrow.  Continue HD MWF.   Vinson Moselle MD pager [phone]    cell [phone] 11/21/2013, 1:14 PM     Labs: Basic Metabolic Panel:  Recent Labs Lab 11/19/13 1003 11/19/13 1645 11/20/13 0350  NA 142  --  135*  K 4.6  --  5.4*  CL 99   --  95*  CO2 25  --  23  GLUCOSE 182*  --  98  BUN 47*  --  61*  CREATININE 7.29* 7.53* 8.26*  CALCIUM 10.0  --  10.1  PHOS  --  3.4  --   CBC:  Recent Labs Lab 11/19/13 1003 11/19/13 1645 11/20/13 0350  WBC 10.9* 7.6 7.6  NEUTROABS 8.8*  --   --   HGB 11.4* 12.2 11.3*  HCT 35.3* 37.3 35.0*  MCV 94.9 94.7 95.6  PLT 150 164 139*   Cardiac Enzymes:  Recent Labs Lab 11/19/13 1551 11/20/13 0350  TROPONINI <0.30 <0.30   CBG:  Recent Labs Lab 11/19/13 2146 11/20/13 0815 11/20/13 1235 11/20/13 1904 11/21/13 0727  GLUCAP 152* 110* 156* 89 159*    Studies/Results: Ct Head Wo Contrast  11/19/2013   CLINICAL DATA:  Cardiac arrest during dialysis. Vomiting after resuscitation. Headache.  EXAM: CT HEAD WITHOUT CONTRAST  TECHNIQUE: Contiguous axial images were obtained from the base of the skull through the vertex without intravenous contrast.  COMPARISON:  IR ANGIO VERTEBRAL SEL SUBCLAVIAN INNOMINATE BILAT MOD SED dated 09/28/2013; CT HEAD W/O CM dated 09/20/2013  FINDINGS: Focal central pontine hypodensity, image 10  of series 2, stable, most compatible with remote lacunar infarct.  Otherwise, the brainstem, cerebellum, cerebral peduncles, thalamus, basal ganglia, basilar cisterns, and ventricular system appear within normal limits. No intracranial hemorrhage, mass lesion, or acute CVA. There is atherosclerotic calcification of the cavernous carotid arteries bilaterally.  IMPRESSION: 1. Small remote lacunar infarct in the mid pons. No acute intracranial findings.   Electronically Signed   By: Herbie BaltimoreWalt  Liebkemann M.D.   On: 11/19/2013 11:13   Medications:   . aspirin  81 mg Oral Pre-Cath  . famotidine  20 mg Oral QHS  . feeding supplement (NEPRO CARB STEADY)  237 mL Oral Daily  . heparin  5,000 Units Subcutaneous 3 times per day  . insulin aspart  0-9 Units Subcutaneous TID WC  . [START ON 11/22/2013] midodrine  5 mg Oral Q M,W,F-HD  . multivitamin  1 tablet Oral QHS  . pantoprazole   40 mg Oral BID  . prednisoLONE acetate  1 drop Left Eye TID  . sevelamer carbonate  1,600 mg Oral TID WC  . sodium chloride  3 mL Intravenous Q12H

## 2013-11-21 NOTE — Progress Notes (Signed)
[facility] PROGRESS NOTE  Briana Gould ZOX:[id number] DOB: [birth year] DOA: 11/19/2013 PCP: Laurena SlimmerLARK,PRESTON S, MD  Assessment/Plan: 1-Cardiac arrest: exact etiology remains unclear. This is patient third cardiac arrest in the last 3 months.  -this time patient was able to recover after just some Chest compression and her VS has remains stable, same as mentation. Patient denies any prodromic symptoms.  -no overnight events; has tolerated HD on 3/14 -will follow cardiology rec's; plan is for R and L Cath on 3/16 -avoid any agent that can slow down her heart  -be permissive with HTN  -continue supportive care  2-DM: using amaryl as an outpatient -A1C 5.7 -continue SSI and low carb diet  3-ESRD (end stage renal disease) on dialysis: per renal service.  -plan is for HD today (3/16).  -electrolytes to be rechecked tomorrow prior to HD  4-Anemia of chronic renal failure: per renal.  -Hgb stable.   5-Paroxysmal a-fib: rate controlled and currently in sinus rhythm.  -will monitor on telemetry   6-PAD: status post left BKA. Stump is intact and without any open wounds or drainage   7-GERD and hx of GIB: continue PPI   8-hyperparathyroidism:  -per renal service -continue renvela  9-hyperkalemia: corrected with HD on 3/14.  Code Status: Full Family Communication: daughter at bedside  Disposition Plan: will transfer to telemetry bed    Consultants:  Cardiology  Renal service   Procedures:  See below for x-ray reports  Antibiotics:  None   HPI/Subjective: Afebrile; feeling ok. No events overnight. Patient tolerated HD w/o any issues on 3/14  Objective: Filed Vitals:   11/21/13 1247  BP: 145/70  Pulse: 74  Temp: 97.7 F (36.5 C)  Resp: 20    Intake/Output Summary (Last 24 hours) at 11/21/13 1342 Last data filed at 11/21/13 0945  Gross per 24 hour  Intake    540 ml  Output    500 ml  Net     40 ml   Filed Weights   11/20/13 0400 11/20/13 1825  11/20/13 2040  Weight: 80.2 kg (176 lb 12.9 oz) 79.6 kg (175 lb 7.8 oz) 79.6 kg (175 lb 7.8 oz)    Exam:   General:  NAD, afebrile. No events overnight  Cardiovascular: S1 and S2, no rubs or gallops. Positive SEM  Respiratory: CTA bilaterally  Abdomen: soft, NT, ND, positive BS  Musculoskeletal: Left BKA, 1-2++ edema RLE  Data Reviewed: Basic Metabolic Panel:  Recent Labs Lab 11/19/13 1003 11/19/13 1645 11/20/13 0350  NA 142  --  135*  K 4.6  --  5.4*  CL 99  --  95*  CO2 25  --  23  GLUCOSE 182*  --  98  BUN 47*  --  61*  CREATININE 7.29* 7.53* 8.26*  CALCIUM 10.0  --  10.1  MG  --  2.4  --   PHOS  --  3.4  --    CBC:  Recent Labs Lab 11/19/13 1003 11/19/13 1645 11/20/13 0350  WBC 10.9* 7.6 7.6  NEUTROABS 8.8*  --   --   HGB 11.4* 12.2 11.3*  HCT 35.3* 37.3 35.0*  MCV 94.9 94.7 95.6  PLT 150 164 139*   Cardiac Enzymes:  Recent Labs Lab 11/19/13 1551 11/20/13 0350  TROPONINI <0.30 <0.30   BNP (last 3 results)  Recent Labs  09/20/13 0913 09/20/13 1155  PROBNP 57457.0* 47172.0*   CBG:  Recent Labs Lab 11/20/13 0815 11/20/13 1235 11/20/13 1904 11/21/13 0727 11/21/13 1201  GLUCAP 110*  156* 89 159* 184*    Recent Results (from the past 240 hour(s))  MRSA PCR SCREENING     Status: None   Collection Time    11/19/13  3:55 PM      Result Value Ref Range Status   MRSA by PCR NEGATIVE  NEGATIVE Final   Comment:            The GeneXpert MRSA Assay (FDA     approved for NASAL specimens     only), is one component of a     comprehensive MRSA colonization     surveillance program. It is not     intended to diagnose MRSA     infection nor to guide or     monitor treatment for     MRSA infections.     Studies: No results found.  Scheduled Meds: . aspirin  81 mg Oral Pre-Cath  . famotidine  20 mg Oral QHS  . feeding supplement (NEPRO CARB STEADY)  237 mL Oral Daily  . heparin  5,000 Units Subcutaneous 3 times per day  . insulin  aspart  0-9 Units Subcutaneous TID WC  . [START ON 11/22/2013] midodrine  5 mg Oral Q M,W,F-HD  . multivitamin  1 tablet Oral QHS  . pantoprazole  40 mg Oral BID  . prednisoLONE acetate  1 drop Left Eye TID  . sevelamer carbonate  1,600 mg Oral TID WC  . sodium chloride  3 mL Intravenous Q12H   Continuous Infusions:   Principal Problem:   Cardiac arrest Active Problems:   DM   ESRD (end stage renal disease) on dialysis   Anemia of chronic renal failure   Paroxysmal a-fib    Time spent: < 30 minutes    Armen Pickup  [facility] Pager [phone]. If 7PM-7AM, please contact night-coverage at [email or web address], password [facility] 11/21/2013, 1:42 PM  LOS: 2 days

## 2013-11-22 ENCOUNTER — Encounter (HOSPITAL_COMMUNITY)
Admission: EM | Disposition: A | Discharge status: Home Health | Payer: Self-pay | Source: Home / Self Care | Attending: Internal Medicine

## 2013-11-22 DIAGNOSIS — I2510 Atherosclerotic heart disease of native coronary artery without angina pectoris: Secondary | ICD-10-CM

## 2013-11-22 DIAGNOSIS — Z8719 Personal history of other diseases of the digestive system: Secondary | ICD-10-CM

## 2013-11-22 HISTORY — PX: LEFT AND RIGHT HEART CATHETERIZATION WITH CORONARY ANGIOGRAM: SHX5449

## 2013-11-22 LAB — CBC
HCT: 33.7 % — ABNORMAL LOW (ref 36.0–46.0)
HEMATOCRIT: 34.2 % — AB (ref 36.0–46.0)
HEMOGLOBIN: 11 g/dL — AB (ref 12.0–15.0)
Hemoglobin: 11.5 g/dL — ABNORMAL LOW (ref 12.0–15.0)
MCH: 30 pg (ref 26.0–34.0)
MCH: 31.5 pg (ref 26.0–34.0)
MCHC: 32.2 g/dL (ref 30.0–36.0)
MCHC: 34.1 g/dL (ref 30.0–36.0)
MCV: 93.2 fL (ref 78.0–100.0)
MCV: 92.3 fL (ref 78.0–100.0)
Platelets: 177 10*3/uL (ref 150–400)
Platelets: 140 10*3/uL — ABNORMAL LOW (ref 150–400)
RBC: 3.67 MIL/uL — ABNORMAL LOW (ref 3.87–5.11)
RBC: 3.65 MIL/uL — ABNORMAL LOW (ref 3.87–5.11)
RDW: 16.5 % — ABNORMAL HIGH (ref 11.5–15.5)
RDW: 16.5 % — ABNORMAL HIGH (ref 11.5–15.5)
WBC: 7 10*3/uL (ref 4.0–10.5)
WBC: 7 10*3/uL (ref 4.0–10.5)

## 2013-11-22 LAB — PROTIME-INR
INR: 1.06 (ref 0.00–1.49)
Prothrombin Time: 13.6 seconds (ref 11.6–15.2)

## 2013-11-22 LAB — GLUCOSE, CAPILLARY
GLUCOSE-CAPILLARY: 146 mg/dL — AB (ref 70–99)
GLUCOSE-CAPILLARY: 128 mg/dL — AB (ref 70–99)
Glucose-Capillary: 128 mg/dL — ABNORMAL HIGH (ref 70–99)
Glucose-Capillary: 218 mg/dL — ABNORMAL HIGH (ref 70–99)

## 2013-11-22 LAB — RENAL FUNCTION PANEL
Albumin: 3.3 g/dL — ABNORMAL LOW (ref 3.5–5.2)
BUN: 53 mg/dL — ABNORMAL HIGH (ref 6–23)
CALCIUM: 10 mg/dL (ref 8.4–10.5)
CO2: 25 meq/L (ref 19–32)
CREATININE: 7.36 mg/dL — AB (ref 0.50–1.10)
Chloride: 93 mEq/L — ABNORMAL LOW (ref 96–112)
GFR calc Af Amer: 6 mL/min — ABNORMAL LOW (ref >90)
GFR calc non Af Amer: 5 mL/min — ABNORMAL LOW (ref >90)
Glucose, Bld: 173 mg/dL — ABNORMAL HIGH (ref 70–99)
Phosphorus: 3.3 mg/dL (ref 2.3–4.6)
Potassium: 5.4 mEq/L — ABNORMAL HIGH (ref 3.7–5.3)
Sodium: 134 mEq/L — ABNORMAL LOW (ref 137–147)

## 2013-11-22 LAB — CREATININE, SERUM
Creatinine, Ser: 7.56 mg/dL — ABNORMAL HIGH (ref 0.50–1.10)
GFR calc Af Amer: 6 mL/min — ABNORMAL LOW (ref >90)
GFR calc non Af Amer: 5 mL/min — ABNORMAL LOW (ref >90)

## 2013-11-22 SURGERY — LEFT AND RIGHT HEART CATHETERIZATION WITH CORONARY ANGIOGRAM
Anesthesia: LOCAL | Site: Groin | Laterality: Right

## 2013-11-22 MED ORDER — NITROGLYCERIN 0.2 MG/ML ON CALL CATH LAB
INTRAVENOUS | Status: AC
  Filled 2013-11-22: qty 1

## 2013-11-22 MED ORDER — HEPARIN SODIUM (PORCINE) 5000 UNIT/ML IJ SOLN
5000.0000 [IU] | Freq: Three times a day (TID) | INTRAMUSCULAR | Status: DC
  Administered 2013-11-22 – 2013-11-24 (×5): 5000 [IU] via SUBCUTANEOUS
  Filled 2013-11-22 (×8): qty 1

## 2013-11-22 MED ORDER — MIDAZOLAM HCL 2 MG/2ML IJ SOLN
INTRAMUSCULAR | Status: AC
  Filled 2013-11-22: qty 2

## 2013-11-22 MED ORDER — LIDOCAINE HCL (PF) 1 % IJ SOLN
INTRAMUSCULAR | Status: AC
  Filled 2013-11-22: qty 30

## 2013-11-22 MED ORDER — SODIUM CHLORIDE 0.9 % IV SOLN
INTRAVENOUS | Status: DC
  Administered 2013-11-22: 13:00:00 via INTRAVENOUS

## 2013-11-22 MED ORDER — HEPARIN (PORCINE) IN NACL 2-0.9 UNIT/ML-% IJ SOLN
INTRAMUSCULAR | Status: AC
  Filled 2013-11-22: qty 1000

## 2013-11-22 NOTE — Interval H&P Note (Signed)
History and Physical Interval Note:  11/22/2013 9:18 AM  Marwa Swanston-Lintner  has presented today for surgery, with the diagnosis of cp  The various methods of treatment have been discussed with the patient and family. After consideration of risks, benefits and other options for treatment, the patient has consented to  Procedure(s): LEFT AND RIGHT HEART CATHETERIZATION WITH CORONARY ANGIOGRAM (Right) as a surgical intervention .  The patient's history has been reviewed, patient examined, no change in status, stable for surgery.  I have reviewed the patient's chart and labs.  Questions were answered to the patient's satisfaction.    Cath Lab Visit (complete for each Cath Lab visit)  Clinical Evaluation Leading to the Procedure:   ACS: no  Non-ACS:    Anginal Classification: No Symptoms  Anti-ischemic medical therapy: No Therapy  Non-Invasive Test Results: No non-invasive testing performed  Prior CABG: No previous CABG       Theron Aristaeter [facility] CenterJordanMD,FACC 11/22/2013 9:18 AM

## 2013-11-22 NOTE — Procedures (Signed)
I was present at this dialysis session. I have reviewed the session itself and made appropriate changes.   LHC results reviewed.  No clear targets for intervention at risk of bleeding.    Sabra Heckyan Vincenzina Jagoda  MD 11/22/2013, 4:52 PM

## 2013-11-22 NOTE — CV Procedure (Signed)
   Cardiac Catheterization Procedure Note  Name: Briana Gould MRN: [id number] DOB: 09-13-1945  Procedure: Right Heart Cath, Left Heart Cath, Selective Coronary Angiography, LV angiography  Indication: 68 yo BF with history of ESRD, PAD, CVA, pulmonary HTN, and CAD presents with recurrent cardiac arrests on dialysis.    Procedural Details: The right groin was prepped, draped, and anesthetized with 1% lidocaine. Using the modified Seldinger technique a 5 French sheath was placed in the right femoral artery and a 7 French sheath was placed in the right femoral vein. A Swan-Ganz catheter was used for the right heart catheterization. Standard protocol was followed for recording of right heart pressures and sampling of oxygen saturations. Fick cardiac output was calculated. Standard Judkins catheters were used for selective coronary angiography and left ventriculography. There were no immediate procedural complications. The patient was transferred to the post catheterization recovery area for further monitoring.  Procedural Findings: Hemodynamics RA 22/24 mean 20 mm Hg RV 71/22 mm Hg PA 75/29 mean 47 mm Hg PCWP 20/23 mean 19 mm Hg LV 163/17 mm Hg AO 160/56 mean 95 mm Hg  Oxygen saturations: PA 65% AO 85%  Cardiac Output (Fick) 8.4 L/min  Cardiac Index (Fick) 4.45 L/min/m2   Coronary angiography: Coronary dominance: codominant  Left mainstem: Calcified 20% distal disease  Left anterior descending (LAD): severe calcification. In the mid vessel there is 50-60% disease. In the distal vessel there is a long segment of disease up to 70-80%.  Left circumflex (LCx): This is a large codominant vessel. It is heavily calcified. There is 90% stenosis in the proximal vessel involving the ostium. This is followed by an 80-90% stenosis in the mid vessel after the first OM. The distal LCx is diffusely diseased up to 50%. The first OM is occluded. The second OM is small with 90% ostial disease.  The third OM is moderate in size with 90% proximal disease. The posterolateral branch and PDA are without obstructive disease.   Right coronary artery (RCA): 100% occluded in the mid vessel. There are left to right collaterals to the distal RCA.   Left ventriculography: Left ventricular systolic function is abnormal, LVEF is estimated at 40-45%, There is severe hypokinesis to akinesis of the inferior wall. There is no significant mitral regurgitation   Final Conclusions:   1. Severe 3 vessel obstructive CAD. This has progressed significantly since Dec. 2011.  2. Mild to moderate LV dysfunction. 3. Severe pulmonary HTN.  Recommendations: This patient has severe progression of CAD since 2011. She has chronic severe pulmonary HTN. Given her co-morbidities she is not a candidate for CABG. The only lesion amenable to PCI is in the proximal to mid LCx. Given recent GI bleed she is not a candidate for dual antiplatelet therapy therefore PCI is not an option. Even if this could be stented she would still have significant ischemic burden. Need to discuss with patient her poor prognosis. Consider palliative care.    [facility] CenterJordanMD,FACC 11/22/2013, 10:12 AM

## 2013-11-22 NOTE — Progress Notes (Signed)
[facility] PROGRESS NOTE  Briana Gould UJW:[id number] DOB: 04-Oct-1945 DOA: 11/19/2013 PCP: Laurena SlimmerLARK,PRESTON S, MD  Assessment/Plan: 1-Cardiac arrest: exact etiology remains unclear. This is patient third cardiac arrest in the last 3 months.  -this time patient was able to recover after just some Chest compression and her VS has remains stable, same as mentation. Patient denies any prodromic symptoms.  -no overnight events; has tolerated HD on 3/14; next treatment plan for today (3/16) -will follow cardiology rec's; plan is for R and L Cath today 3/16 -continue avoiding any agent that can slow down her heart  -be permissive with HTN  -continue supportive care  2-DM: using amaryl as an outpatient -A1C 5.7 -continue SSI and low carb diet while in the hospital  3-ESRD (end stage renal disease) on dialysis: per renal service.  -plan is for HD today (3/16) after Cath.  -electrolytes to be rechecked tomorrow prior to HD  4-Anemia of chronic renal failure: per renal.  -Hgb stable.   5-Paroxysmal a-fib: rate controlled and currently in sinus rhythm.  -will monitor on telemetry   6-PAD: status post left BKA. Stump is intact and without any open wounds or drainage   7-GERD and hx of GIB: continue PPI   8-hyperparathyroidism:  -per renal service -continue renvela  9-hyperkalemia: corrected with HD on 3/14.  Code Status: Full Family Communication: daughter at bedside  Disposition Plan: will transfer to telemetry bed    Consultants:  Cardiology  Renal service   Procedures:  See below for x-ray reports  Antibiotics:  None   HPI/Subjective: Afebrile; feeling ok. No events overnight. Patient with plans for cath lab today and HD later.  Objective: Filed Vitals:   11/22/13 0700  BP: 168/88  Pulse: 69  Temp: 98.5 F (36.9 C)  Resp: 20    Intake/Output Summary (Last 24 hours) at 11/22/13 0912 Last data filed at 11/21/13 1845  Gross per 24 hour  Intake     240 ml  Output      0 ml  Net    240 ml   Filed Weights   11/20/13 1825 11/20/13 2040 11/21/13 2129  Weight: 79.6 kg (175 lb 7.8 oz) 79.6 kg (175 lb 7.8 oz) 80.3 kg (177 lb 0.5 oz)    Exam:   General:  NAD, afebrile. No events overnight  Cardiovascular: S1 and S2, no rubs or gallops. Positive SEM  Respiratory: CTA bilaterally  Abdomen: soft, NT, ND, positive BS  Musculoskeletal: Left BKA, 1-2++ edema RLE  Data Reviewed: Basic Metabolic Panel:  Recent Labs Lab 11/19/13 1003 11/19/13 1645 11/20/13 0350  NA 142  --  135*  K 4.6  --  5.4*  CL 99  --  95*  CO2 25  --  23  GLUCOSE 182*  --  98  BUN 47*  --  61*  CREATININE 7.29* 7.53* 8.26*  CALCIUM 10.0  --  10.1  MG  --  2.4  --   PHOS  --  3.4  --    CBC:  Recent Labs Lab 11/19/13 1003 11/19/13 1645 11/20/13 0350 11/22/13 0530  WBC 10.9* 7.6 7.6 7.0  NEUTROABS 8.8*  --   --   --   HGB 11.4* 12.2 11.3* 11.0*  HCT 35.3* 37.3 35.0* 34.2*  MCV 94.9 94.7 95.6 93.2  PLT 150 164 139* 177   Cardiac Enzymes:  Recent Labs Lab 11/19/13 1551 11/20/13 0350  TROPONINI <0.30 <0.30   BNP (last 3 results)  Recent Labs  09/20/13 0913  09/20/13 1155  PROBNP 57457.0* 47172.0*   CBG:  Recent Labs Lab 11/21/13 0727 11/21/13 1201 11/21/13 1646 11/21/13 2134 11/22/13 0849  GLUCAP 159* 184* 140* 163* 146*    Recent Results (from the past 240 hour(s))  MRSA PCR SCREENING     Status: None   Collection Time    11/19/13  3:55 PM      Result Value Ref Range Status   MRSA by PCR NEGATIVE  NEGATIVE Final   Comment:            The GeneXpert MRSA Assay (FDA     approved for NASAL specimens     only), is one component of a     comprehensive MRSA colonization     surveillance program. It is not     intended to diagnose MRSA     infection nor to guide or     monitor treatment for     MRSA infections.     Studies: No results found.  Scheduled Meds: . famotidine  20 mg Oral QHS  . feeding supplement  (NEPRO CARB STEADY)  237 mL Oral Daily  . heparin  5,000 Units Subcutaneous 3 times per day  . insulin aspart  0-9 Units Subcutaneous TID WC  . midodrine  5 mg Oral Q M,W,F-HD  . multivitamin  1 tablet Oral QHS  . pantoprazole  40 mg Oral BID  . prednisoLONE acetate  1 drop Left Eye TID  . sevelamer carbonate  1,600 mg Oral TID WC  . sodium chloride  3 mL Intravenous Q12H   Continuous Infusions:   Principal Problem:   Cardiac arrest Active Problems:   DM   ESRD (end stage renal disease) on dialysis   Anemia of chronic renal failure   Paroxysmal a-fib    Time spent: < 30 minutes    Armen Pickup  [facility] Pager [phone]. If 7PM-7AM, please contact night-coverage at [email or web address], password [facility] 11/22/2013, 9:12 AM  LOS: 3 days

## 2013-11-22 NOTE — H&P (View-Only) (Signed)
Subjective: No SOB or CP   Objective: Filed Vitals:   11/20/13 1857 11/20/13 2040 11/20/13 2135 11/21/13 0610  BP: 170/67  162/71 148/67  Pulse: 91  77 69  Temp: 98.7 F (37.1 C)  98.8 F (37.1 C) 98.6 F (37 C)  TempSrc: Oral  Oral Oral  Resp: 18  18 20   Height:      Weight:  175 lb 7.8 oz (79.6 kg)    SpO2: 100%  98% 100%   Weight change: -2 lb 8.2 oz (-1.14 kg)  Intake/Output Summary (Last 24 hours) at 11/21/13 0734 Last data filed at 11/21/13 0651  Gross per 24 hour  Intake    420 ml  Output    500 ml  Net    -80 ml    General: Alert, awake, oriented x3, in no acute distress Neck:  JVP is normal Heart: Regular rate and rhythm, without murmurs, rubs, gallops.  Lungs: Clear to auscultation.  No rales or wheezes. Exemities:  No edema.   Neuro: Grossly intact, nonfocal.   Lab Results: Results for orders placed during the hospital encounter of 11/19/13 (from the past 24 hour(s))  GLUCOSE, CAPILLARY     Status: Abnormal   Collection Time    11/20/13  8:15 AM      Result Value Ref Range   Glucose-Capillary 110 (*) 70 - 99 mg/dL  GLUCOSE, CAPILLARY     Status: Abnormal   Collection Time    11/20/13 12:35 PM      Result Value Ref Range   Glucose-Capillary 156 (*) 70 - 99 mg/dL  GLUCOSE, CAPILLARY     Status: None   Collection Time    11/20/13  7:04 PM      Result Value Ref Range   Glucose-Capillary 89  70 - 99 mg/dL    Studies/Results: @RISRSLT24 @  Medications:  Reviewed   @PROBHOSP @  1.  Cardiac arrest.  Patient has not had a few events at dialysis requiring CPR  Seen last admit  Refused L heart cath to define anatomy.  Now amenable.   Plan R and L heart cath in AM (Echo in past sugg pulm HTN).  Plan for AM  2.  CAD  Diffuse CAD  Last cath in 2011  3.  CV dz  Severe  WIll need eval in future  4.  ESRD  Will get dialysis after    LOS: 2 days   Dietrich Patesaula Abdulkarim Eberlin 11/21/2013, 7:34 AM

## 2013-11-23 DIAGNOSIS — Z9189 Other specified personal risk factors, not elsewhere classified: Secondary | ICD-10-CM

## 2013-11-23 DIAGNOSIS — E785 Hyperlipidemia, unspecified: Secondary | ICD-10-CM

## 2013-11-23 LAB — GLUCOSE, CAPILLARY
Glucose-Capillary: 144 mg/dL — ABNORMAL HIGH (ref 70–99)
Glucose-Capillary: 266 mg/dL — ABNORMAL HIGH (ref 70–99)
Glucose-Capillary: 126 mg/dL — ABNORMAL HIGH (ref 70–99)
Glucose-Capillary: 146 mg/dL — ABNORMAL HIGH (ref 70–99)

## 2013-11-23 LAB — POCT I-STAT 3, ART BLOOD GAS (G3+)
ACID-BASE EXCESS: 2 mmol/L (ref 0.0–2.0)
Bicarbonate: 26.7 mEq/L — ABNORMAL HIGH (ref 20.0–24.0)
O2 SAT: 85 %
TCO2: 28 mmol/L (ref 0–100)
pCO2 arterial: 40.4 mmHg (ref 35.0–45.0)
pH, Arterial: 7.428 (ref 7.350–7.450)
pO2, Arterial: 49 mmHg — ABNORMAL LOW (ref 80.0–100.0)

## 2013-11-23 LAB — POCT I-STAT 3, VENOUS BLOOD GAS (G3P V)
ACID-BASE EXCESS: 1 mmol/L (ref 0.0–2.0)
BICARBONATE: 25.9 meq/L — AB (ref 20.0–24.0)
O2 SAT: 65 %
TCO2: 27 mmol/L (ref 0–100)
pCO2, Ven: 39.4 mmHg — ABNORMAL LOW (ref 45.0–50.0)
pH, Ven: 7.426 — ABNORMAL HIGH (ref 7.250–7.300)
pO2, Ven: 33 mmHg (ref 30.0–45.0)

## 2013-11-23 NOTE — Progress Notes (Signed)
I saw the patient and agree with the above assessment and plan.    Palliatve care meeting tomorrow.  Despite cardiac illness, pt seems clear that she wishes to cont on HD.  Will work to minimize UF rate and permit higher BPs.    Sabra Heckyan Juvon Teater, MD

## 2013-11-23 NOTE — Telephone Encounter (Signed)
There was no telephone encounter with this patient.  Jacquelynn CreeLove, Pamela S, PA-C

## 2013-11-23 NOTE — Progress Notes (Signed)
.   Patient Name: Briana Gould Date of Encounter: 11/23/2013     Principal Problem:   Cardiac arrest Active Problems:   DM   ESRD (end stage renal disease) on dialysis   Anemia of chronic renal failure   Paroxysmal a-fib    SUBJECTIVE  Feels fine. Wants to know "whats next." she also inquired about cardiac rehab. Tolerated HD yesterday well with no sx.    CURRENT MEDS . famotidine  20 mg Oral QHS  . feeding supplement (NEPRO CARB STEADY)  237 mL Oral Daily  . heparin  5,000 Units Subcutaneous 3 times per day  . insulin aspart  0-9 Units Subcutaneous TID WC  . midodrine  5 mg Oral Q M,W,F-HD  . multivitamin  1 tablet Oral QHS  . pantoprazole  40 mg Oral BID  . prednisoLONE acetate  1 drop Left Eye TID  . sevelamer carbonate  1,600 mg Oral TID WC    OBJECTIVE  Filed Vitals:   11/22/13 1958 11/22/13 2010 11/22/13 2045 11/23/13 0300  BP: 140/83 155/78 164/71 173/73  Pulse: 69 70 74 74  Temp: 98.1 F (36.7 C)  99.2 F (37.3 C) 99.1 F (37.3 C)  TempSrc: Oral  Oral Oral  Resp: 20 19 20 18   Height:      Weight: 185 lb 13.6 oz (84.3 kg)   180 lb 12.4 oz (82 kg)  SpO2: 99%  96% 96%    Intake/Output Summary (Last 24 hours) at 11/23/13 0936 Last data filed at 11/22/13 2148  Gross per 24 hour  Intake    240 ml  Output    492 ml  Net   -252 ml   Filed Weights   11/22/13 1552 11/22/13 1958 11/23/13 0300  Weight: 185 lb 3 oz (84 kg) 185 lb 13.6 oz (84.3 kg) 180 lb 12.4 oz (82 kg)    PHYSICAL EXAM  General: Pleasant, NAD. Neuro: Alert and oriented X 3. Moves all extremities spontaneously. Psych: Normal affect. HEENT:  Normal  Neck: Supple without bruits or JVD. Lungs:  Resp regular and unlabored, CTA. Heart: RRR no s3, s4. + murmur. 3/6 SEM at RUSB Abdomen: Soft, non-tender, non-distended, BS + x 4.  Extremities: No clubbing, cyanosis R 1+ Edema, Left BKA.   Accessory Clinical Findings  CBC  Recent Labs  11/22/13 0530 11/22/13 1230  WBC 7.0 7.0   HGB 11.0* 11.5*  HCT 34.2* 33.7*  MCV 93.2 92.3  PLT 177 140*   Basic Metabolic Panel  Recent Labs  11/22/13 1230  NA 134*  K 5.4*  CL 93*  CO2 25  GLUCOSE 173*  BUN 53*  CREATININE 7.56*  7.36*  CALCIUM 10.0  PHOS 3.3   Liver Function Tests  Recent Labs  11/22/13 1230  ALBUMIN 3.3*    TELE  NSR with freq PVCs  Radiology/Studies  Ct Head Wo Contrast  11/19/2013   CLINICAL DATA:  Cardiac arrest during dialysis. Vomiting after resuscitation. Headache.  EXAM: CT HEAD WITHOUT CONTRAST  TECHNIQUE: Contiguous axial images were obtained from the base of the skull through the vertex without intravenous contrast.  COMPARISON:  IR ANGIO VERTEBRAL SEL SUBCLAVIAN INNOMINATE BILAT MOD SED dated 09/28/2013; CT HEAD W/O CM dated 09/20/2013  FINDINGS: Focal central pontine hypodensity, image 10 of series 2, stable, most compatible with remote lacunar infarct.  Otherwise, the brainstem, cerebellum, cerebral peduncles, thalamus, basal ganglia, basilar cisterns, and ventricular system appear within normal limits. No intracranial hemorrhage, mass lesion, or acute CVA. There is  atherosclerotic calcification of the cavernous carotid arteries bilaterally.  IMPRESSION: 1. Small remote lacunar infarct in the mid pons. No acute intracranial findings.   Electronically Signed   By: Herbie Baltimore M.D.   On: 11/19/2013 11:13   Dg Chest Port 1 View  11/19/2013   CLINICAL DATA:  Loss of consciousness  EXAM: PORTABLE CHEST - 1 VIEW  COMPARISON:  September 25, 2013  FINDINGS: The heart size and mediastinal contours are stable. The heart size is enlarged. There is pulmonary edema. There is no focal pneumonia or pleural effusion. The visualized skeletal structures are stable.  IMPRESSION: Congestive heart failure.   Electronically Signed   By: Sherian Rein M.D.   On: 11/19/2013 09:57   Cardiac Catheterization Procedure Note  DOB: [birth year]  Procedure: Right Heart Cath, Left Heart Cath, Selective Coronary  Angiography, LV angiography  Indication: 68 yo BF with history of ESRD, PAD, CVA, pulmonary HTN, and CAD presents with recurrent cardiac arrests on dialysis.  Procedural Details: The right groin was prepped, draped, and anesthetized with 1% lidocaine. Using the modified Seldinger technique a 5 French sheath was placed in the right femoral artery and a 7 French sheath was placed in the right femoral vein. A Swan-Ganz catheter was used for the right heart catheterization. Standard protocol was followed for recording of right heart pressures and sampling of oxygen saturations. Fick cardiac output was calculated. Standard Judkins catheters were used for selective coronary angiography and left ventriculography. There were no immediate procedural complications. The patient was transferred to the post catheterization recovery area for further monitoring.  Procedural Findings:  Hemodynamics  RA 22/24 mean 20 mm Hg  RV 71/22 mm Hg  PA 75/29 mean 47 mm Hg  PCWP 20/23 mean 19 mm Hg  LV 163/17 mm Hg  AO 160/56 mean 95 mm Hg  Oxygen saturations:  PA 65%  AO 85%  Cardiac Output (Fick) 8.4 L/min  Cardiac Index (Fick) 4.45 L/min/m2  Coronary angiography:  Coronary dominance: codominant  Left mainstem: Calcified 20% distal disease  Left anterior descending (LAD): severe calcification. In the mid vessel there is 50-60% disease. In the distal vessel there is a long segment of disease up to 70-80%.  Left circumflex (LCx): This is a large codominant vessel. It is heavily calcified. There is 90% stenosis in the proximal vessel involving the ostium. This is followed by an 80-90% stenosis in the mid vessel after the first OM. The distal LCx is diffusely diseased up to 50%. The first OM is occluded. The second OM is small with 90% ostial disease. The third OM is moderate in size with 90% proximal disease. The posterolateral branch and PDA are without obstructive disease.  Right coronary artery (RCA): 100% occluded in  the mid vessel. There are left to right collaterals to the distal RCA.  Left ventriculography: Left ventricular systolic function is abnormal, LVEF is estimated at 40-45%, There is severe hypokinesis to akinesis of the inferior wall. There is no significant mitral regurgitation  Final Conclusions:  1. Severe 3 vessel obstructive CAD. This has progressed significantly since Dec. 2011.  2. Mild to moderate LV dysfunction.  3. Severe pulmonary HTN.  Recommendations: This patient has severe progression of CAD since 2011. She has chronic severe pulmonary HTN. Given her co-morbidities she is not a candidate for CABG. The only lesion amenable to PCI is in the proximal to mid LCx. Given recent GI bleed she is not a candidate for dual antiplatelet therapy therefore PCI is not an  option. Even if this could be stented she would still have significant ischemic burden. Need to discuss with patient her poor prognosis. Consider palliative care.    ASSESSMENT AND PLAN  68 yo BF with history of ESRD, PAD, CVA, pulmonary HTN, and CAD presented with recurrent cardiac arrests on dialysis.  Cardiac arrest: exact etiology remains unclear. This is patient third cardiac arrest in the last 3 months.  -this time patient was able to recover after just some Chest compression and her VS has remains stable, same as mentation. -Patient denies any prodromic symptoms.  -continue avoiding any agent that can slow down her heart (hx of bradycardia and BB held w/improvement). Can be permissive with HTN.  - R and L Cath 3/16 Conclusions: Severe 3 vessel obstructive CAD, which has progressed significantly since Dec. 2011; Mild to moderate LV dysfunction;Severe pulmonary HTN. This patient has severe progression of CAD since 2011. She has chronic severe pulmonary HTN. Given her co-morbidities she is not a candidate for CABG. The only lesion amenable to PCI is in the proximal to mid LCx. Given recent GI bleed she is not a candidate for dual  antiplatelet therapy therefore PCI is not an option. Even if this could be stented she would still have significant ischemic burden. Need to discuss with patient her poor prognosis. - - She needs a palliative care discussion, will consult. - Patient inquires about cardiac rehab, will consult.   Paroxysmal a-fib: rate controlled and currently in sinus rhythm.  -will monitor on telemetry   ESRD on dialysis: per renal service.  -Had HD after Cath (3/16) yesterday and tolerated it well   PAD: status post left BKA. Stump is intact and without any open wounds or drainage   DM:  -A1C 5.7  -continue SSI per IM  Anemia of chronic renal failure: per renal.  -Hgb stable.   GERD and hx of GIB: continue PPI   Hyperparathyroidism:  -per renal service  -continue renvela   Hyperkalemia: 5.5 yesterday. BMET pending today.   Urban Gibson PA-C  Pager [phone]  I seen and evaluated the patient this afternoon along with Venetia Maxon, KATHRYN PA-C. I agree with her findings, assessment and recommendations.  No further symptoms today. At this point I agree with Dr. Elvis Coil assessment that her best options would be to consider a more palliative symptom-based approach as opposed to a restorative/fixing it approach. She has extensive CAD that does not to be very revascularizable. The only possible lesion would require Rotablator and would only be a partial fix.    Unfortunately we are not clear as to what is causing her arrests events on dialysis,  And would not be sure that we are treating the correct etiology. She is actually asymptomatic with exception of cardiac arrest episodes.  I do think the cardiac rehabilitation would be helpful. I also think a palliative care consult is reasonable.  Would consider the possibility of Ranexa for multivessel disease. With her low blood pressures during dialysis being on midodrine we have not much room to titrate cardiac medications. Would very much like to  have her on beta blocker if possible. Would consider low-dose Toprol - if nephrology will approve.    No clear arrhythmias noted.  Marykay Lex, M.D., M.S. Interventional Cardiologist  [facility] Pager # [phone] 11/23/2013

## 2013-11-23 NOTE — Progress Notes (Signed)
Thank you for consulting the Palliative Medicine Team at [facility] to meet your patient's and family's needs.   The reason that you asked us to see your patient is for GOC and options.   We have scheduled your patient for a meeting: 3/18 10am  The Surrogate decision make is: [name] (dtr) Contact information: [phone]   Your patient is able/unable to participate: able  Yong ChannelAlicia Konnie Noffsinger, NP Palliative Medicine Team Pager # [phone] (M-F 8a-5p) Team Phone # [phone] (Nights/Weekends)

## 2013-11-23 NOTE — Progress Notes (Signed)
[facility]  Patient Status: Active (receiving services up to time of hospitalization)  [facility] is providing the following services: RN and PT  If patient discharges after hours, please call [phone].   Briana BurgerStephanie M Gould 11/23/2013, 11:22 AM

## 2013-11-23 NOTE — Progress Notes (Signed)
Subjective:  Tolerated HD yesterday/ noted card cath report of advanced disease Objective Vital signs in last 24 hours: Filed Vitals:   11/22/13 1958 11/22/13 2010 11/22/13 2045 11/23/13 0300  BP: 140/83 155/78 164/71 173/73  Pulse: 69 70 74 74  Temp: 98.1 F (36.7 C)  99.2 F (37.3 C) 99.1 F (37.3 C)  TempSrc: Oral  Oral Oral  Resp: 20 19 20 18   Height:      Weight: 84.3 kg (185 lb 13.6 oz)   82 kg (180 lb 12.4 oz)  SpO2: 99%  96% 96%   EXAM= Chest CTA bilat  Rare irreg beat ( occasional PVC on telemetry) rate stable 80, soft 2/6 SEM no RG  Abd Obese,soft, nt,  R lower leg  2+  pitting edema, L BKA prothesis on  LUA AVF +bruit   Dialysis: MWF [facility]  4h ( info corrected from op info 77.0 kg edw) 300/800 Buttonhole lower part AVF or sharp needle top section Heparin none  EPO 7600   Assessment/Plan  1. Recurrent cardioresp arrest-  No episode in hosp / Avoiding  More than 500 cc uf  On hd 2. ESRD on hemodialysis normal schedule MWF 3. Hypertension/volume- permissive HTN Only 500 cc uf yesterday maintaining some vol excess with pedal edema due to #1, noted  Today wt 82 (with op edw 77kg before this admit [facility]19/13  [facility] on / Midodrine 5 mg pre hd/  4. Anemia Hb 11.5,holding  epo 5. Metabolic bone disease -phos 3.3 ca 10.3 corrected onRenvela Binder and vit d 6.  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 Swaziland[place] notes with GI bld . History not a candidate for PCI  Or CABG with her comorbiditites 7. Hx recent GIB- no hep with HD due to this/ stable hgb and no Melena with stool in hosp .  Labs: Basic Metabolic Panel:  Recent Labs Lab 11/19/13 1003 11/19/13 1645 11/20/13 0350 11/22/13 1230  NA 142  --  135* 134*  K 4.6  --  5.4* 5.4*  CL 99  --  95* 93*  CO2 25  --  23 25  GLUCOSE 182*  --  98 173*  BUN 47*  --  61* 53*  CREATININE 7.29* 7.53* 8.26* 7.56*  7.36*   CALCIUM 10.0  --  10.1 10.0  PHOS  --  3.4  --  3.3   Liver Function Tests:  Recent Labs Lab 11/22/13 1230  ALBUMIN 3.3*  CBC:  Recent Labs Lab 11/19/13 1003 11/19/13 1645 11/20/13 0350 11/22/13 0530 11/22/13 1230  WBC 10.9* 7.6 7.6 7.0 7.0  NEUTROABS 8.8*  --   --   --   --   HGB 11.4* 12.2 11.3* 11.0* 11.5*  HCT 35.3* 37.3 35.0* 34.2* 33.7*  MCV 94.9 94.7 95.6 93.2 92.3  PLT 150 164 139* 177 140*   Cardiac Enzymes:  Recent Labs Lab 11/19/13 1551 11/20/13 0350  TROPONINI <0.30 <0.30   CBG:  Recent Labs Lab 11/22/13 1018 11/22/13 1151 11/22/13 2144 11/23/13 0544 11/23/13 1117  GLUCAP 128* 128* 218* 144* 266*    Studies/Results: No results found. Medications: . sodium chloride 10 mL/hr at 11/22/13 1230   . famotidine  20 mg Oral QHS  . feeding supplement (NEPRO CARB STEADY)  237 mL Oral Daily  . heparin  5,000 Units Subcutaneous 3 times per day  .  insulin aspart  0-9 Units Subcutaneous TID WC  . midodrine  5 mg Oral Q M,W,F-HD  . multivitamin  1 tablet Oral QHS  . pantoprazole  40 mg Oral BID  . prednisoLONE acetate  1 drop Left Eye TID  . sevelamer carbonate  1,600 mg Oral TID WC    Lenny Pastel, PA-C [facility] Beeper [phone] 11/23/2013,12:06 PM  LOS: 4 days

## 2013-11-23 NOTE — Progress Notes (Addendum)
CARDIAC REHAB PHASE I   PRE:  Rate/Rhythm: 78 SR with PVCs    BP: sitting 150/72    SaO2: 99-100 RA  MODE:  Ambulation: 260 ft   POST:  Rate/Rhythm: 98 SR    BP: sitting 200/86, 160/80 after 5 min    SaO2: 98RA  Pt able to don prosthesis without assistance. Needed assist x2 to stand from low recliner. Tolerated walk well with RW, steady, assist x2 for supervision. BP very elevated after walk, notified RN. Discussed CRPII with pt. She is very interested however it will be difficult as her HD days are the same days. Pt sts she could possibly do CRPII at 2:45 after dialysis. Pt would like referral sent to [place]. Will need permission from cardiology to exercise.  [id number]  Briana Gould, Briana Gould CES, ACSM 11/23/2013 2:14 PM

## 2013-11-23 NOTE — Progress Notes (Signed)
[facility] PROGRESS NOTE  Briana Gould ZOX:[id number] DOB: 1946-01-11 DOA: 11/19/2013 PCP: Laurena SlimmerLARK,PRESTON S, MD  Assessment/Plan: 1-Cardiac arrest: exact etiology remains unclear. This is the patient third cardiac arrest in the last 3 months.  -this time patient was able to recover after just some Chest compression and her VS has remains stable, same as her mentation. Patient denies any prodromic symptoms prior this event.  -no overnight events or any other cardiac arrest with HD; has tolerated HD on 3/14; and (3/16) -cardiology performed cardiac cath and the results are for severe extensive CAD; not a candidate for any intervention -plan is for palliative care meeting and continue as much as possible medication management  -continue to be permissive with HTN  -continue low UF during HD  2-DM: using amaryl as an outpatient -A1C 5.7 -continue SSI and low carb diet while in the hospital  3-ESRD (end stage renal disease) on dialysis: per renal service.  -plan is for HD today (3/18)  -electrolytes stable -BMET pre HD  4-Anemia of chronic renal failure: per renal.  -Hgb stable.   5-Paroxysmal a-fib: rate controlled and currently in sinus rhythm.  -will continue to monitor on telemetry   6-PAD: status post left BKA. Stump is intact and without any open wounds or drainage   7-GERD and hx of GIB: continue PPI   8-hyperparathyroidism:  -per renal service -continue renvela  9-hyperkalemia: corrected with HD   Code Status: Full Family Communication: daughter at bedside  Disposition Plan: will transfer to telemetry bed    Consultants:  Cardiology  Renal service   Palliative care (Goals of care meeting on 3/18)  Procedures:  See below for x-ray reports  Antibiotics:  None   HPI/Subjective: Afebrile; feeling ok. No acute complaints. Has tolerated HD while inpatient  Objective: Filed Vitals:   11/23/13 1410  BP: 137/72  Pulse: 80  Temp: 98.4 F (36.9  C)  Resp: 20    Intake/Output Summary (Last 24 hours) at 11/23/13 1751 Last data filed at 11/23/13 1300  Gross per 24 hour  Intake    360 ml  Output    492 ml  Net   -132 ml   Filed Weights   11/22/13 1552 11/22/13 1958 11/23/13 0300  Weight: 84 kg (185 lb 3 oz) 84.3 kg (185 lb 13.6 oz) 82 kg (180 lb 12.4 oz)    Exam:   General:  NAD, afebrile. No events overnight  Cardiovascular: S1 and S2, no rubs or gallops. Positive SEM  Respiratory: CTA bilaterally  Abdomen: soft, NT, ND, positive BS  Musculoskeletal: Left BKA, 1-2++ edema RLE  Data Reviewed: Basic Metabolic Panel:  Recent Labs Lab 11/19/13 1003 11/19/13 1645 11/20/13 0350 11/22/13 1230  NA 142  --  135* 134*  K 4.6  --  5.4* 5.4*  CL 99  --  95* 93*  CO2 25  --  23 25  GLUCOSE 182*  --  98 173*  BUN 47*  --  61* 53*  CREATININE 7.29* 7.53* 8.26* 7.56*  7.36*  CALCIUM 10.0  --  10.1 10.0  MG  --  2.4  --   --   PHOS  --  3.4  --  3.3   CBC:  Recent Labs Lab 11/19/13 1003 11/19/13 1645 11/20/13 0350 11/22/13 0530 11/22/13 1230  WBC 10.9* 7.6 7.6 7.0 7.0  NEUTROABS 8.8*  --   --   --   --   HGB 11.4* 12.2 11.3* 11.0* 11.5*  HCT 35.3* 37.3 35.0*  34.2* 33.7*  MCV 94.9 94.7 95.6 93.2 92.3  PLT 150 164 139* 177 140*   Cardiac Enzymes:  Recent Labs Lab 11/19/13 1551 11/20/13 0350  TROPONINI <0.30 <0.30   BNP (last 3 results)  Recent Labs  09/20/13 0913 09/20/13 1155  PROBNP 57457.0* 47172.0*   CBG:  Recent Labs Lab 11/22/13 1151 11/22/13 2144 11/23/13 0544 11/23/13 1117 11/23/13 1653  GLUCAP 128* 218* 144* 266* 126*    Recent Results (from the past 240 hour(s))  MRSA PCR SCREENING     Status: None   Collection Time    11/19/13  3:55 PM      Result Value Ref Range Status   MRSA by PCR NEGATIVE  NEGATIVE Final   Comment:            The GeneXpert MRSA Assay (FDA     approved for NASAL specimens     only), is one component of a     comprehensive MRSA colonization      surveillance program. It is not     intended to diagnose MRSA     infection nor to guide or     monitor treatment for     MRSA infections.     Studies: No results found.  Scheduled Meds: . famotidine  20 mg Oral QHS  . feeding supplement (NEPRO CARB STEADY)  237 mL Oral Daily  . heparin  5,000 Units Subcutaneous 3 times per day  . insulin aspart  0-9 Units Subcutaneous TID WC  . midodrine  5 mg Oral Q M,W,F-HD  . multivitamin  1 tablet Oral QHS  . pantoprazole  40 mg Oral BID  . prednisoLONE acetate  1 drop Left Eye TID  . sevelamer carbonate  1,600 mg Oral TID WC   Continuous Infusions: . sodium chloride 10 mL/hr at 11/22/13 1230    Principal Problem:   Cardiac arrest Active Problems:   DM   ESRD (end stage renal disease) on dialysis   Anemia of chronic renal failure   Paroxysmal a-fib    Time spent: < 30 minutes    Briana Gould  [facility] Pager [phone]. If 7PM-7AM, please contact night-coverage at [email or web address], password [facility] 11/23/2013, 5:51 PM  LOS: 4 days

## 2013-11-24 DIAGNOSIS — I080 Rheumatic disorders of both mitral and aortic valves: Secondary | ICD-10-CM

## 2013-11-24 LAB — CBC
HCT: 30.4 % — ABNORMAL LOW (ref 36.0–46.0)
HEMOGLOBIN: 10 g/dL — AB (ref 12.0–15.0)
MCH: 30.9 pg (ref 26.0–34.0)
MCHC: 32.9 g/dL (ref 30.0–36.0)
MCV: 93.8 fL (ref 78.0–100.0)
PLATELETS: 136 10*3/uL — AB (ref 150–400)
RBC: 3.24 MIL/uL — ABNORMAL LOW (ref 3.87–5.11)
RDW: 16.4 % — ABNORMAL HIGH (ref 11.5–15.5)
WBC: 5 10*3/uL (ref 4.0–10.5)

## 2013-11-24 LAB — GLUCOSE, CAPILLARY
Glucose-Capillary: 127 mg/dL — ABNORMAL HIGH (ref 70–99)
Glucose-Capillary: 109 mg/dL — ABNORMAL HIGH (ref 70–99)
Glucose-Capillary: 220 mg/dL — ABNORMAL HIGH (ref 70–99)
Glucose-Capillary: 233 mg/dL — ABNORMAL HIGH (ref 70–99)

## 2013-11-24 LAB — RENAL FUNCTION PANEL
ALBUMIN: 3 g/dL — AB (ref 3.5–5.2)
BUN: 43 mg/dL — ABNORMAL HIGH (ref 6–23)
CALCIUM: 9.7 mg/dL (ref 8.4–10.5)
CO2: 25 mEq/L (ref 19–32)
CREATININE: 5.85 mg/dL — AB (ref 0.50–1.10)
Chloride: 95 mEq/L — ABNORMAL LOW (ref 96–112)
GFR calc Af Amer: 8 mL/min — ABNORMAL LOW (ref >90)
GFR, EST NON AFRICAN AMERICAN: 7 mL/min — AB (ref >90)
Glucose, Bld: 187 mg/dL — ABNORMAL HIGH (ref 70–99)
Phosphorus: 3 mg/dL (ref 2.3–4.6)
Potassium: 4.8 mEq/L (ref 3.7–5.3)
Sodium: 136 mEq/L — ABNORMAL LOW (ref 137–147)

## 2013-11-24 MED ORDER — RANOLAZINE ER 500 MG PO TB12
500.0000 mg | ORAL_TABLET | Freq: Two times a day (BID) | ORAL | Status: DC
  Administered 2013-11-24: 500 mg via ORAL
  Filled 2013-11-24 (×2): qty 1

## 2013-11-24 MED ORDER — SODIUM CHLORIDE 0.9 % IV SOLN: 100.0000 mL | INTRAVENOUS | Status: DC | PRN

## 2013-11-24 MED ORDER — METOPROLOL TARTRATE 12.5 MG HALF TABLET: 12.5000 mg | ORAL_TABLET | Freq: Two times a day (BID) | ORAL | Status: DC

## 2013-11-24 MED ORDER — NEPRO/CARBSTEADY PO LIQD
237.0000 mL | ORAL | Status: DC | PRN
  Filled 2013-11-24: qty 237

## 2013-11-24 MED ORDER — ALTEPLASE 2 MG IJ SOLR
2.0000 mg | Freq: Once | INTRAMUSCULAR | Status: DC | PRN
  Filled 2013-11-24: qty 2

## 2013-11-24 MED ORDER — ISOSORBIDE DINITRATE 10 MG PO TABS: 10.0000 mg | ORAL_TABLET | Freq: Three times a day (TID) | ORAL | Status: DC

## 2013-11-24 MED ORDER — RANOLAZINE ER 500 MG PO TB12: 500.0000 mg | ORAL_TABLET | Freq: Two times a day (BID) | ORAL | Status: DC

## 2013-11-24 MED ORDER — ISOSORBIDE DINITRATE 10 MG PO TABS
10.0000 mg | ORAL_TABLET | Freq: Three times a day (TID) | ORAL | Status: DC
  Administered 2013-11-24: 10 mg via ORAL
  Filled 2013-11-24 (×2): qty 1

## 2013-11-24 MED ORDER — PENTAFLUOROPROP-TETRAFLUOROETH EX AERO: 1.0000 "application " | INHALATION_SPRAY | CUTANEOUS | Status: DC | PRN

## 2013-11-24 MED ORDER — LIDOCAINE HCL (PF) 1 % IJ SOLN: 5.0000 mL | INTRAMUSCULAR | Status: DC | PRN

## 2013-11-24 MED ORDER — METOPROLOL TARTRATE 12.5 MG HALF TABLET
12.5000 mg | ORAL_TABLET | Freq: Three times a day (TID) | ORAL | Status: DC
  Administered 2013-11-24: 12.5 mg via ORAL
  Filled 2013-11-24 (×2): qty 1

## 2013-11-24 MED ORDER — LIDOCAINE-PRILOCAINE 2.5-2.5 % EX CREA
1.0000 "application " | TOPICAL_CREAM | CUTANEOUS | Status: DC | PRN
  Filled 2013-11-24: qty 5

## 2013-11-24 MED ORDER — HEPARIN SODIUM (PORCINE) 1000 UNIT/ML DIALYSIS
1000.0000 [IU] | INTRAMUSCULAR | Status: DC | PRN
  Filled 2013-11-24: qty 1

## 2013-11-24 NOTE — Procedures (Signed)
I was present at this dialysis session. I have reviewed the session itself and made appropriate changes.   Will do goal UF 55100mL/hr if SBP > 130.  Discussed with pt limiting IDWG to minimize required UF w/ treatments.  Pt feels well today.    Sabra Heckyan Adin Lariccia  MD 11/24/2013, 8:48 AM

## 2013-11-24 NOTE — Progress Notes (Signed)
.   Patient Name: Briana Gould Date of Encounter: 11/24/2013     Principal Problem:   Cardiac arrest Active Problems:   DM   ESRD (end stage renal disease) on dialysis   Anemia of chronic renal failure   Paroxysmal a-fib    SUBJECTIVE  Feeling good. Had dialsys today and is feeling well. She is ready to go home.    CURRENT MEDS . famotidine  20 mg Oral QHS  . feeding supplement (NEPRO CARB STEADY)  237 mL Oral Daily  . heparin  5,000 Units Subcutaneous 3 times per day  . insulin aspart  0-9 Units Subcutaneous TID WC  . midodrine  5 mg Oral Q M,W,F-HD  . multivitamin  1 tablet Oral QHS  . pantoprazole  40 mg Oral BID  . prednisoLONE acetate  1 drop Left Eye TID  . sevelamer carbonate  1,600 mg Oral TID WC    OBJECTIVE  Filed Vitals:   11/24/13 0931 11/24/13 1000 11/24/13 1030 11/24/13 1100  BP: 140/61 163/80 169/75 171/75  Pulse: 64 64 66 67  Temp:      TempSrc:      Resp: 16 14 12 18   Height:      Weight:      SpO2:        Intake/Output Summary (Last 24 hours) at 11/24/13 1116 Last data filed at 11/24/13 0800  Gross per 24 hour  Intake    840 ml  Output      0 ml  Net    840 ml   Filed Weights   11/23/13 0300 11/24/13 0446 11/24/13 0833  Weight: 180 lb 12.4 oz (82 kg) 186 lb 1.1 oz (84.4 kg) 186 lb 15.2 oz (84.8 kg)    PHYSICAL EXAM  General: Pleasant, NAD. Neuro: Alert and oriented X 3. Moves all extremities spontaneously. Psych: Normal affect. HEENT:  Normal  Neck: Supple without bruits or JVD. Lungs:  Resp regular and unlabored, CTA. Heart: RRR no s3, s4. + murmur. 3/6 SEM at RUSB Abdomen: Soft, non-tender, non-distended, BS + x 4.  Extremities: No clubbing, cyanosis R 1+ Edema, Left BKA.   Accessory Clinical Findings  CBC  Recent Labs  11/22/13 1230 11/24/13 0927  WBC 7.0 5.0  HGB 11.5* 10.0*  HCT 33.7* 30.4*  MCV 92.3 93.8  PLT 140* 136*   Basic Metabolic Panel  Recent Labs  11/22/13 1230 11/24/13 0842  NA 134*  136*  K 5.4* 4.8  CL 93* 95*  CO2 25 25  GLUCOSE 173* 187*  BUN 53* 43*  CREATININE 7.56*  7.36* 5.85*  CALCIUM 10.0 9.7  PHOS 3.3 3.0   Liver Function Tests  Recent Labs  11/22/13 1230 11/24/13 0842  ALBUMIN 3.3* 3.0*    TELE  NSR with freq PVCs  Radiology/Studies  Ct Head Wo Contrast  11/19/2013   CLINICAL DATA:  Cardiac arrest during dialysis. Vomiting after resuscitation. Headache.  EXAM: CT HEAD WITHOUT CONTRAST  TECHNIQUE: Contiguous axial images were obtained from the base of the skull through the vertex without intravenous contrast.  COMPARISON:  IR ANGIO VERTEBRAL SEL SUBCLAVIAN INNOMINATE BILAT MOD SED dated 09/28/2013; CT HEAD W/O CM dated 09/20/2013  FINDINGS: Focal central pontine hypodensity, image 10 of series 2, stable, most compatible with remote lacunar infarct.  Otherwise, the brainstem, cerebellum, cerebral peduncles, thalamus, basal ganglia, basilar cisterns, and ventricular system appear within normal limits. No intracranial hemorrhage, mass lesion, or acute CVA. There is atherosclerotic calcification of the cavernous carotid arteries  bilaterally.  IMPRESSION: 1. Small remote lacunar infarct in the mid pons. No acute intracranial findings.   Electronically Signed   By: Herbie Baltimore M.D.   On: 11/19/2013 11:13   Dg Chest Port 1 View  11/19/2013   CLINICAL DATA:  Loss of consciousness  EXAM: PORTABLE CHEST - 1 VIEW  COMPARISON:  September 25, 2013  FINDINGS: The heart size and mediastinal contours are stable. The heart size is enlarged. There is pulmonary edema. There is no focal pneumonia or pleural effusion. The visualized skeletal structures are stable.  IMPRESSION: Congestive heart failure.   Electronically Signed   By: Sherian Rein M.D.   On: 11/19/2013 09:57   Cardiac Catheterization Procedure Note  DOB: Jan 06, 1946  Procedure: Right Heart Cath, Left Heart Cath, Selective Coronary Angiography, LV angiography  Indication: 68 yo BF with history of ESRD, PAD,  CVA, pulmonary HTN, and CAD presents with recurrent cardiac arrests on dialysis.  Procedural Details: The right groin was prepped, draped, and anesthetized with 1% lidocaine. Using the modified Seldinger technique a 5 French sheath was placed in the right femoral artery and a 7 French sheath was placed in the right femoral vein. A Swan-Ganz catheter was used for the right heart catheterization. Standard protocol was followed for recording of right heart pressures and sampling of oxygen saturations. Fick cardiac output was calculated. Standard Judkins catheters were used for selective coronary angiography and left ventriculography. There were no immediate procedural complications. The patient was transferred to the post catheterization recovery area for further monitoring.  Procedural Findings:  Hemodynamics  RA 22/24 mean 20 mm Hg  RV 71/22 mm Hg  PA 75/29 mean 47 mm Hg  PCWP 20/23 mean 19 mm Hg  LV 163/17 mm Hg  AO 160/56 mean 95 mm Hg  Oxygen saturations:  PA 65%  AO 85%  Cardiac Output (Fick) 8.4 L/min  Cardiac Index (Fick) 4.45 L/min/m2  Coronary angiography:  Coronary dominance: codominant  Left mainstem: Calcified 20% distal disease  Left anterior descending (LAD): severe calcification. In the mid vessel there is 50-60% disease. In the distal vessel there is a long segment of disease up to 70-80%.  Left circumflex (LCx): This is a large codominant vessel. It is heavily calcified. There is 90% stenosis in the proximal vessel involving the ostium. This is followed by an 80-90% stenosis in the mid vessel after the first OM. The distal LCx is diffusely diseased up to 50%. The first OM is occluded. The second OM is small with 90% ostial disease. The third OM is moderate in size with 90% proximal disease. The posterolateral branch and PDA are without obstructive disease.  Right coronary artery (RCA): 100% occluded in the mid vessel. There are left to right collaterals to the distal RCA.  Left  ventriculography: Left ventricular systolic function is abnormal, LVEF is estimated at 40-45%, There is severe hypokinesis to akinesis of the inferior wall. There is no significant mitral regurgitation  Final Conclusions:  1. Severe 3 vessel obstructive CAD. This has progressed significantly since Dec. 2011.  2. Mild to moderate LV dysfunction.  3. Severe pulmonary HTN.  Recommendations: This patient has severe progression of CAD since 2011. She has chronic severe pulmonary HTN. Given her co-morbidities she is not a candidate for CABG. The only lesion amenable to PCI is in the proximal to mid LCx. Given recent GI bleed she is not a candidate for dual antiplatelet therapy therefore PCI is not an option. Even if this could be stented  she would still have significant ischemic burden. Need to discuss with patient her poor prognosis. Consider palliative care.    ASSESSMENT AND PLAN  68 yo BF with history of ESRD, PAD, CVA, pulmonary HTN, and CAD presented with recurrent cardiac arrests on dialysis.  Cardiac arrest/ severe CAD: exact etiology remains unclear. This is patient third cardiac arrest in the last 3 months.  -this time patient was able to recover after just some Chest compression and her VS has remains stable, same as mentation. -Patient denies any prodromic symptoms.  -continue avoiding any agent that can slow down her heart (hx of bradycardia and BB held w/improvement). Can be permissive with HTN.  - R and L Cath 3/16 Conclusions: Severe 3 vessel obstructive CAD, which has progressed significantly since Dec. 2011; Mild to moderate LV dysfunction;Severe pulmonary HTN. This patient has severe progression of CAD since 2011. She has chronic severe pulmonary HTN. Given her co-morbidities she is not a candidate for CABG. The only lesion amenable to PCI is in the proximal to mid LCx. Given recent GI bleed she is not a candidate for dual antiplatelet therapy therefore PCI is not an option. Even if this  could be stented she would still have significant ischemic burden.  - Patient seen by cardiac rehab yesterday and recommends CRPII at 2:45 after dialysis-- needs cardiology's permission. MD to see.  - Would like to add low dose of toprol if nephrology approves  Paroxysmal a-fib: rate controlled and currently in sinus rhythm.  -will monitor on telemetry   ESRD on dialysis: per renal service.  -Had HD after Cath (3/16) yesterday and tolerated it well   PAD: status post left BKA. Stump is intact and without any open wounds or drainage   DM:  -A1C 5.7  -continue SSI per IM  Anemia of chronic renal failure: per renal.  -Hgb stable.   GERD and hx of GIB: continue PPI   Hyperparathyroidism:  -per renal service  -continue renvela   Hyperkalemia: resolved. 4.8 today  Dispo- she tolerated HD well today and is ready to go home. She is stable from a cardiac standpoint and had a long conversation about her situation with Dr. Herbie Baltimore yesterday. She is clear on her limited options. Palliative care asked to consult although it seems as though there was never a meeting. MD to see.   Urban Gibson PA-C  Pager [phone]  I saw the patient this evening after she was seen by Thereasa Parkin PA-C earlier today. I actually had the benefit of seeing her as she was being discharged.  I was able to talk to her family members as well.    Again a very difficult situation with the patient who is on midodrine on her dialysis days in order to keep her blood pressures up but then has blood pressures in the 18 his otherwise. We have added low-dose beta blocker, nitrate and Ranexa. I recommended that the beta blocker be held on the mornings of dialysis.  She currently has an appointment to followup with Dr. Excell Seltzer, however it would appear that Dr. [place] may know her better. Basilar name she recognizes. We will try to effect this change in scheduling to see if she can be scheduled to see Dr. [place] as  opposed to Dr. Excell Seltzer. She'll be notified of this change.  Marykay Lex, M.D., M.S. Interventional Cardiologist  [facility] Pager # [phone] 11/24/2013

## 2013-11-24 NOTE — Discharge Instructions (Signed)

## 2013-11-24 NOTE — Discharge Summary (Signed)
Physician Discharge Summary  Briana Gould ZOX:[id number] DOB: 04-10-1946 DOA: 11/19/2013  PCP: [name], MD  Admit date: 11/19/2013 Discharge date: 11/24/2013  Recommendations for Outpatient Follow-up:  1. Pt will need to follow up with PCP in 2-3 weeks post discharge  Discharge Diagnoses: Cardiac arrest  Principal Problem:   Cardiac arrest Active Problems:   DM   ESRD (end stage renal disease) on dialysis   Anemia of chronic renal failure   Paroxysmal a-fib  Discharge Condition: Stable  Diet recommendation: Heart healthy diet discussed in details   History of present illness:  68 y.o. female with pmh significant for DM type 2, PAD (s/p BKA), ESRD, GERD, CAD and two prior admissions (Jan and Feb) with cardiac arrest; came to hospital from HD center after experiencing another mild episode of cardiac arrest after initiating HD therapy. Patient denies any prodromic symptoms prior to episode and per reports she was noticed to have agonal breathing and subsequent pulseless episode. Patient received CPR and turned around with just chest compression. She was transferred to ED for further evaluation. While in ED patient hs been fine, VSS and denies any acute complaints, except for mild HA. CT head negative. [facility] called to admit patient for further evaluation and treatment.   Hospital Course:  Cardiac arrest - exact etiology remains unclear, this is the patient's third cardiac arrest in the last 3 months.  - this time patient was able to recover after just some Chest compression and her VS has remained stable - no overnight events or any other cardiac arrest with HD; has tolerated HD on 3/14; and (3/16); and 3/18 - cardiology performed cardiac cath and the results c/w severe extensive CAD; not a candidate for any intervention  DM - using amaryl as an outpatient  - A1C 5.7  ESRD (end stage renal disease) on dialysis - per nephrology team  - HD tolerated well and pt wants to go  home  - electrolytes stable  Anemia of chronic renal failure - Hgb stable.  Paroxysmal a-fib - rate controlled and currently in sinus rhythm.  PAD - status post left BKA. Stump is intact and without any open wounds or drainage  GERD and hx of GIB - continue PPI  Hyperparathyroidism - continue renvela  Hyperkalemia - corrected with HD   Code Status: Full  Family Communication: daughter at bedside   Consultants:  Cardiology  Renal service  Palliative care (Goals of care meeting on 3/18) Procedures:  See below for x-ray reports Antibiotics:  None   Procedures/Studies: Ct Head Wo Contrast   11/19/2013  Small remote lacunar infarct in the mid pons. No acute intracranial findings.    Dg Chest Port 1 View   11/19/2013   Congestive heart failure.     Discharge Exam: Filed Vitals:   11/24/13 1342  BP: 188/61  Pulse: 76  Temp: 98.7 F (37.1 C)  Resp: 17   Filed Vitals:   11/24/13 1230 11/24/13 1245 11/24/13 1300 11/24/13 1342  BP: 172/75 171/78 175/82 188/61  Pulse: 71 73 75 76  Temp:   98 F (36.7 C) 98.7 F (37.1 C)  TempSrc:   Oral Oral  Resp: 19 17 15 17   Height:      Weight:   82.2 kg (181 lb 3.5 oz)   SpO2:   100% 99%    General: Pt is alert, follows commands appropriately, not in acute distress Cardiovascular: Regular rate and rhythm, S1/S2 +, SEM 3/6, no rubs, no gallops Respiratory: Clear to  auscultation bilaterally, no wheezing, no crackles, no rhonchi Abdominal: Soft, non tender, non distended, bowel sounds +, no guarding Extremities: No clubbing, cyanosis, R 1+ Edema, Left BKA.    Discharge Instructions  Discharge Orders   Future Appointments Provider Department Dept Phone   12/07/2013 9:30 AM Tonny BollmanMichael Cooper, MD [facility] [address] Office [phone]   Future Orders Complete By Expires   Amb Referral to Cardiac Rehabilitation  As directed        Medication List         b complex-vitamin c-folic acid 0.8 MG Tabs tablet  Take 1 tablet by  mouth daily.     famotidine 20 MG tablet  Commonly known as:  PEPCID  Take 1 tablet (20 mg total) by mouth at bedtime.     feeding supplement (NEPRO CARB STEADY) Liqd  Take 237 mLs by mouth daily.     glimepiride 1 MG tablet  Commonly known as:  AMARYL  Take 1 tablet (1 mg total) by mouth daily with breakfast.     isosorbide dinitrate 10 MG tablet  Commonly known as:  ISORDIL  Take 1 tablet (10 mg total) by mouth 3 (three) times daily.     metoprolol tartrate 12.5 mg Tabs tablet  Commonly known as:  LOPRESSOR  Take 0.5 tablets (12.5 mg total) by mouth 2 (two) times daily.     midodrine 5 MG tablet  Commonly known as:  PROAMATINE  Take 1 tablet (5 mg total) by mouth every Monday, Wednesday, and Friday with hemodialysis.     pantoprazole 40 MG tablet  Commonly known as:  PROTONIX  Take 1 tablet (40 mg total) by mouth 2 (two) times daily.     ranolazine 500 MG 12 hr tablet  Commonly known as:  RANEXA  Take 1 tablet (500 mg total) by mouth 2 (two) times daily.     sevelamer carbonate 800 MG tablet  Commonly known as:  RENVELA  Take 1,600 mg by mouth 3 (three) times daily with meals.           Follow-up Information   Schedule an appointment as soon as possible for a visit with Laurena SlimmerLARK,PRESTON S, MD.   Specialty:  Internal Medicine   Contact information:   [address] [place] [address] [phone]       Follow up with Debbora PrestoMAGICK-Macee Venables, MD. (call my cell phone with questions )    Specialty:  Internal Medicine   Contact information:   [address] [place] [address] [phone]        The results of significant diagnostics from this hospitalization (including imaging, microbiology, ancillary and laboratory) are listed below for reference.     Microbiology: Recent Results (from the past 240 hour(s))  MRSA PCR SCREENING     Status: None   Collection Time    11/19/13  3:55 PM      Result Value Ref Range Status   MRSA by PCR  NEGATIVE  NEGATIVE Final   Comment:            The GeneXpert MRSA Assay (FDA     approved for NASAL specimens     only), is one component of a     comprehensive MRSA colonization     surveillance program. It is not     intended to diagnose MRSA     infection nor to guide or     monitor treatment for     MRSA infections.  Labs: Basic Metabolic Panel:  Recent Labs Lab 11/19/13 1003 11/19/13 1645 11/20/13 0350 11/22/13 1230 11/24/13 0842  NA 142  --  135* 134* 136*  K 4.6  --  5.4* 5.4* 4.8  CL 99  --  95* 93* 95*  CO2 25  --  23 25 25   GLUCOSE 182*  --  98 173* 187*  BUN 47*  --  61* 53* 43*  CREATININE 7.29* 7.53* 8.26* 7.56*  7.36* 5.85*  CALCIUM 10.0  --  10.1 10.0 9.7  MG  --  2.4  --   --   --   PHOS  --  3.4  --  3.3 3.0   Liver Function Tests:  Recent Labs Lab 11/22/13 1230 11/24/13 0842  ALBUMIN 3.3* 3.0*   CBC:  Recent Labs Lab 11/19/13 1003 11/19/13 1645 11/20/13 0350 11/22/13 0530 11/22/13 1230 11/24/13 0927  WBC 10.9* 7.6 7.6 7.0 7.0 5.0  NEUTROABS 8.8*  --   --   --   --   --   HGB 11.4* 12.2 11.3* 11.0* 11.5* 10.0*  HCT 35.3* 37.3 35.0* 34.2* 33.7* 30.4*  MCV 94.9 94.7 95.6 93.2 92.3 93.8  PLT 150 164 139* 177 140* 136*   Cardiac Enzymes:  Recent Labs Lab 11/19/13 1551 11/20/13 0350  TROPONINI <0.30 <0.30   BNP: BNP (last 3 results)  Recent Labs  09/20/13 0913 09/20/13 1155  PROBNP 57457.0* 47172.0*   CBG:  Recent Labs Lab 11/23/13 1117 11/23/13 1653 11/23/13 2146 11/24/13 0617 11/24/13 1329  GLUCAP 266* 126* 146* 127* 109*     SIGNED: Time coordinating discharge: Over 30 minutes  Debbora Presto, MD  [facility] 11/24/2013, 3:11 PM Pager [phone]  If 7PM-7AM, please contact night-coverage [email or web address] Password [email or web address]

## 2013-11-24 NOTE — Progress Notes (Signed)
[id number] Cardiac Rehab Attempted to get pt to ambulate, she declines now states that she is tired and would like to take her medications. She stated that she has not had them since returning from dialysis. Pt hopes that she is going home today. Completed angina education with pt. I gave her exercise guidelines. She voices understanding.  Beatrix FettersHughes, Ireene Ballowe G, RN 11/24/2013 2:35 PM

## 2013-11-25 NOTE — Progress Notes (Signed)
Patient d.c. Via w.c. With daughter to home with belongings. No complaints voiced. D.c. Instr. Gone over w/  Patient by first shift r.n. And Dr. Lavell AnchorsHardening came in and discuss meds with patient and daughter. Daughter read d.c. Instr. And appears to understand them.

## 2013-11-25 NOTE — Care Management Note (Signed)
    Page 1 of 1   11/25/2013     1:27:36 PM   CARE MANAGEMENT NOTE 11/25/2013  Patient:  Briana Gould,Briana Gould   Account Number:  [id number]  Date Initiated:  11/24/2013  Documentation initiated by:  Silviano Neuser  Subjective/Objective Assessment:   PT ADM ON 3/13 AFTER CARDIAC ARREST DURING HD.  PTA, PT RESIDES AT HOME WITH SPOUSE AND HAS SUPPORTIVE FAMILY.     Action/Plan:   PT DESIRES TO DC HOME TODAY; WAS ACTIVE WITH [facility] FOR HH SERVICES PRIOR TO ADMISSION.  [facility] TO RESUME SERVICES AT DC.   Anticipated DC Date:  11/24/2013   Anticipated DC Plan:  HOME W HOME HEALTH SERVICES      DC Planning Services  CM consult      [facility]  Resumption Of Svcs/PTA Provider   Choice offered to / List presented to:  C-1 Patient        HH arranged  HH-1 RN  HH-2 PT  HH-4 NURSE'S AIDE      HH agency  [facility]   Status of service:  Completed, signed off Medicare Important Message given?   (If response is "NO", the following Medicare IM given date fields will be blank) Date Medicare IM given:   Date Additional Medicare IM given:    Discharge Disposition:  HOME W HOME HEALTH SERVICES  Per UR Regulation:  Reviewed for med. necessity/level of care/duration of stay  If discussed at Long Length of Stay Meetings, dates discussed:    Comments:

## 2013-11-29 ENCOUNTER — Other Ambulatory Visit (HOSPITAL_COMMUNITY): Payer: Self-pay | Admitting: Interventional Radiology

## 2013-11-29 DIAGNOSIS — I679 Cerebrovascular disease, unspecified: Secondary | ICD-10-CM

## 2013-12-07 ENCOUNTER — Encounter: Payer: Self-pay | Admitting: Cardiovascular Disease

## 2013-12-07 ENCOUNTER — Ambulatory Visit (INDEPENDENT_AMBULATORY_CARE_PROVIDER_SITE_OTHER): Payer: Medicare Other | Admitting: Cardiovascular Disease

## 2013-12-07 VITALS — BP 140/100 | HR 81

## 2013-12-07 DIAGNOSIS — J9600 Acute respiratory failure, unspecified whether with hypoxia or hypercapnia: Secondary | ICD-10-CM

## 2013-12-07 DIAGNOSIS — I2510 Atherosclerotic heart disease of native coronary artery without angina pectoris: Secondary | ICD-10-CM

## 2013-12-07 DIAGNOSIS — I359 Nonrheumatic aortic valve disorder, unspecified: Secondary | ICD-10-CM

## 2013-12-07 DIAGNOSIS — J9601 Acute respiratory failure with hypoxia: Secondary | ICD-10-CM

## 2013-12-07 DIAGNOSIS — I10 Essential (primary) hypertension: Secondary | ICD-10-CM

## 2013-12-07 DIAGNOSIS — I350 Nonrheumatic aortic (valve) stenosis: Secondary | ICD-10-CM

## 2013-12-07 NOTE — Patient Instructions (Addendum)
Your physician has recommended you make the following change in your medication:  STOP Ranexa, Isosorbide, Metoprolol (Lopressor)  Your physician wants you to follow-up in: 6 months with Dr. Excell Seltzerooper.  You will receive a reminder letter in the mail two months in advance. If you don't receive a letter, please call our office to schedule the follow-up appointment.

## 2013-12-07 NOTE — Progress Notes (Signed)
HPI:  68 year old woman presenting for followup evaluation. She has a very complex medical history. She's had multiple recent hospital admissions with cardiac arrest during hemodialysis. She has known extensive CAD dating back several years. She has had diffuse disease felt to not be amenable to PCI or surgery. Despite the extent of ischemic disease, her LV function has been preserved. She has multiple other problems including severe carotid stenosis. She's had major bleeding on anticoagulant drugs during her hospitalizations. Her most recent hospital admission was March 13. She again developed PEA and agonal breathing requiring CPR. She responded quickly to resuscitative efforts. She underwent cardiac catheterization March 16 demonstrating a right atrial pressure of 20, PA pressure 75/29 with a mean of 47, preserved cardiac output of 8.4 L per minute, and severe multivessel coronary artery disease with total occlusion of the RCA and 80% or greater stenosis in the LAD and left circumflex and multiple areas of those vessels. Her LVEF was felt to be mildly to moderately depressed and estimated at 40-45% with severe hypokinesis of the inferior wall. Given her severe comorbidities and history of major bleeding on antiplatelet an anti-coagulant drugs, PCI was not considered an option. She has recovered from her hospitalization and presents today for followup evaluation.  The patient denies chest pain or pressure. She was discharged on metoprolol, isosorbide, and Ranexa. She has not been taking metoprolol or Ranexa because of some issues with her prescriptions. She has been taking isosorbide only on nondialysis days. She continues to have some difficulty with hypotension during dialysis and requires Midodrine to maintain an adequate BP. She complains of marked fatigue. She has shortness of breath with activity, but essentially she is wheelchair-bound and is doing very little physically.  Outpatient Encounter  Prescriptions as of 12/07/2013  Medication Sig  . b complex-vitamin c-folic acid (NEPHRO-VITE) 0.8 MG TABS tablet Take 1 tablet by mouth daily.  . famotidine (PEPCID) 20 MG tablet Take 1 tablet (20 mg total) by mouth at bedtime.  Marland Kitchen glimepiride (AMARYL) 1 MG tablet Take 1 tablet (1 mg total) by mouth daily with breakfast.  . isosorbide dinitrate (ISORDIL) 10 MG tablet Take 1 tablet (10 mg total) by mouth 3 (three) times daily.  . metoprolol tartrate (LOPRESSOR) 12.5 mg TABS tablet Take 0.5 tablets (12.5 mg total) by mouth 2 (two) times daily.  . midodrine (PROAMATINE) 5 MG tablet Take 1 tablet (5 mg total) by mouth every Monday, Wednesday, and Friday with hemodialysis.  . Nutritional Supplements (FEEDING SUPPLEMENT, NEPRO CARB STEADY,) LIQD Take 237 mLs by mouth daily.  . pantoprazole (PROTONIX) 40 MG tablet Take 1 tablet (40 mg total) by mouth 2 (two) times daily.  . ranolazine (RANEXA) 500 MG 12 hr tablet Take 1 tablet (500 mg total) by mouth 2 (two) times daily.  . sevelamer carbonate (RENVELA) 800 MG tablet Take 1,600 mg by mouth 3 (three) times daily with meals.     Allergies  Allergen Reactions  . Phenergan [Promethazine Hcl] Other (See Comments)    Causes patient to" vomit more"  . Vitamin D Analogs Rash    Past Medical History  Diagnosis Date  . PAD (peripheral artery disease)     a. L BKA  . Type II diabetes mellitus   . Anemia   . History of blood transfusion 2002    "related to amputation" (08/13/2013)  . End stage renal disease on dialysis     "[facility]; M, W, F" (08/13/2013)  . DVT (deep venous thrombosis)  a. R IJ DVT 08/2013.  Marland Kitchen. CVA (cerebral infarction)     a. MRI 08/2013: possible left motor strip infarct with remote pontine infarct.  . Pulmonary HTN     a. Cath 2011 - mod to severe pulmonary hypertension. b. Echo 08/2013: PAP 45mmHg (mild-mod increased).  . Thrombocytopenia   . CAD (coronary artery disease)     a. Severe diffuse diabetic disease  by cath 2011, for med rx, not a candidate for CABG or PCI at that time.  . Aortic stenosis     mild  . Syncope     a. 2011, etiology never clear.  . Carotid artery disease     a. Dopp 16/109612/2014: 1-39% LICA< 60-79% RICA borderline >80%.  . Cerebrovascular disease     a. 08/2013 MRA brain: multiple bilateral moderate and high grade intracranial stenoses  . Cardiac arrest 08/14/13    a. Possible arrest in setting of hypotension during dialysis with possible arrest requiring brief CPR, not intubated and was fully intact after episode. Suspected due to low cerebral perfusion in setting of hypotension. Unclear if any arrhythmia.  . End stage renal disease on dialysis   . Peripheral arterial occlusive disease   . Hypertension   . Diabetes mellitus type 2, uncontrolled, with complications   . DVT (deep venous thrombosis)      Right internal jugular vein  . CAD (coronary artery disease), native coronary artery 2011    Diffuse distal vessel CAD noted at cardiac catheterization  . MI (myocardial infarction)   . Stroke     ROS: Negative except as per HPI  BP 140/100  Pulse 81  PHYSICAL EXAM: Pt is alert and oriented, very pleasant woman, in a wheelchair, in NAD HEENT: normal Neck: JVP - normal Lungs: CTA bilaterally CV: RRR without murmur or gallop Abd: soft, NT, Positive BS, no hepatomegaly Ext: No right leg edema, left BKA noted Skin: warm/dry no rash  EKG:  NSR 81 bpm with left IVCD  ASSESSMENT AND PLAN: 1. Coronary artery disease, native vessel. The patient clearly has severe multivessel coronary artery disease. She unfortunately has advanced comorbid medical conditions including end-stage renal disease and peripheral arterial disease with history of amputation. She is not a candidate for PCI because of diffuse severe disease and inability to take antiplatelet medications in the setting of severe recurrent anemia. I had a frank discussion with the patient and her family today. I do not  think there are any treatment options for her heart disease. Even medical therapy is fraught with problems as she experiences hypotension during dialysis. I advised her to discontinue isosorbide as she really does not experience any angina and she does not take this medicine during dialysis days when she is having her problems. She has had symptomatic bradycardia in the past and I recommended that she stop her metoprolol (not currently taking because she hasn't been able to get the prescription filled). I will see her back in 6 months. If her bleeding/anemia were to stabilize, it would be reasonable to start aspirin 81 mg daily.  2. Hypotension. Continue Midodrine predialysis.  Tonny BollmanMichael Ji Feldner 12/08/2013 4:48 PM

## 2013-12-12 ENCOUNTER — Encounter (HOSPITAL_COMMUNITY): Payer: Self-pay | Admitting: Emergency Medicine

## 2013-12-12 ENCOUNTER — Other Ambulatory Visit: Payer: Self-pay

## 2013-12-12 ENCOUNTER — Emergency Department (HOSPITAL_COMMUNITY): Payer: Medicare Other

## 2013-12-12 ENCOUNTER — Inpatient Hospital Stay (HOSPITAL_COMMUNITY)
Admission: EM | Admit: 2013-12-12 | Discharge: 2013-12-18 | DRG: 193 | Disposition: A | Discharge status: Skilled Nursing Facility | Payer: Medicare Other | Attending: Internal Medicine | Admitting: Internal Medicine

## 2013-12-12 DIAGNOSIS — I469 Cardiac arrest, cause unspecified: Secondary | ICD-10-CM | POA: Diagnosis present

## 2013-12-12 DIAGNOSIS — I252 Old myocardial infarction: Secondary | ICD-10-CM

## 2013-12-12 DIAGNOSIS — I2489 Other forms of acute ischemic heart disease: Secondary | ICD-10-CM | POA: Diagnosis present

## 2013-12-12 DIAGNOSIS — I480 Paroxysmal atrial fibrillation: Secondary | ICD-10-CM | POA: Diagnosis present

## 2013-12-12 DIAGNOSIS — D631 Anemia in chronic kidney disease: Secondary | ICD-10-CM | POA: Diagnosis present

## 2013-12-12 DIAGNOSIS — K922 Gastrointestinal hemorrhage, unspecified: Secondary | ICD-10-CM | POA: Diagnosis present

## 2013-12-12 DIAGNOSIS — N189 Chronic kidney disease, unspecified: Secondary | ICD-10-CM

## 2013-12-12 DIAGNOSIS — I10 Essential (primary) hypertension: Secondary | ICD-10-CM

## 2013-12-12 DIAGNOSIS — J9601 Acute respiratory failure with hypoxia: Secondary | ICD-10-CM | POA: Diagnosis present

## 2013-12-12 DIAGNOSIS — R569 Unspecified convulsions: Secondary | ICD-10-CM

## 2013-12-12 DIAGNOSIS — I120 Hypertensive chronic kidney disease with stage 5 chronic kidney disease or end stage renal disease: Secondary | ICD-10-CM | POA: Diagnosis present

## 2013-12-12 DIAGNOSIS — S88119A Complete traumatic amputation at level between knee and ankle, unspecified lower leg, initial encounter: Secondary | ICD-10-CM

## 2013-12-12 DIAGNOSIS — R5381 Other malaise: Secondary | ICD-10-CM

## 2013-12-12 DIAGNOSIS — J9600 Acute respiratory failure, unspecified whether with hypoxia or hypercapnia: Secondary | ICD-10-CM

## 2013-12-12 DIAGNOSIS — E669 Obesity, unspecified: Secondary | ICD-10-CM

## 2013-12-12 DIAGNOSIS — Z992 Dependence on renal dialysis: Secondary | ICD-10-CM

## 2013-12-12 DIAGNOSIS — E1122 Type 2 diabetes mellitus with diabetic chronic kidney disease: Secondary | ICD-10-CM | POA: Diagnosis present

## 2013-12-12 DIAGNOSIS — Z823 Family history of stroke: Secondary | ICD-10-CM

## 2013-12-12 DIAGNOSIS — E785 Hyperlipidemia, unspecified: Secondary | ICD-10-CM | POA: Diagnosis present

## 2013-12-12 DIAGNOSIS — Z89519 Acquired absence of unspecified leg below knee: Secondary | ICD-10-CM

## 2013-12-12 DIAGNOSIS — Z8249 Family history of ischemic heart disease and other diseases of the circulatory system: Secondary | ICD-10-CM

## 2013-12-12 DIAGNOSIS — Z79899 Other long term (current) drug therapy: Secondary | ICD-10-CM

## 2013-12-12 DIAGNOSIS — I080 Rheumatic disorders of both mitral and aortic valves: Secondary | ICD-10-CM

## 2013-12-12 DIAGNOSIS — R531 Weakness: Secondary | ICD-10-CM

## 2013-12-12 DIAGNOSIS — Z8673 Personal history of transient ischemic attack (TIA), and cerebral infarction without residual deficits: Secondary | ICD-10-CM

## 2013-12-12 DIAGNOSIS — N2581 Secondary hyperparathyroidism of renal origin: Secondary | ICD-10-CM | POA: Diagnosis present

## 2013-12-12 DIAGNOSIS — I359 Nonrheumatic aortic valve disorder, unspecified: Secondary | ICD-10-CM | POA: Diagnosis present

## 2013-12-12 DIAGNOSIS — I679 Cerebrovascular disease, unspecified: Secondary | ICD-10-CM

## 2013-12-12 DIAGNOSIS — Z8674 Personal history of sudden cardiac arrest: Secondary | ICD-10-CM

## 2013-12-12 DIAGNOSIS — I739 Peripheral vascular disease, unspecified: Secondary | ICD-10-CM | POA: Diagnosis present

## 2013-12-12 DIAGNOSIS — D696 Thrombocytopenia, unspecified: Secondary | ICD-10-CM

## 2013-12-12 DIAGNOSIS — G9340 Encephalopathy, unspecified: Secondary | ICD-10-CM | POA: Diagnosis present

## 2013-12-12 DIAGNOSIS — I2510 Atherosclerotic heart disease of native coronary artery without angina pectoris: Secondary | ICD-10-CM | POA: Diagnosis present

## 2013-12-12 DIAGNOSIS — I6529 Occlusion and stenosis of unspecified carotid artery: Secondary | ICD-10-CM | POA: Diagnosis present

## 2013-12-12 DIAGNOSIS — N186 End stage renal disease: Secondary | ICD-10-CM | POA: Diagnosis present

## 2013-12-12 DIAGNOSIS — E119 Type 2 diabetes mellitus without complications: Secondary | ICD-10-CM | POA: Diagnosis present

## 2013-12-12 DIAGNOSIS — I350 Nonrheumatic aortic (valve) stenosis: Secondary | ICD-10-CM

## 2013-12-12 DIAGNOSIS — Z86718 Personal history of other venous thrombosis and embolism: Secondary | ICD-10-CM

## 2013-12-12 DIAGNOSIS — I82890 Acute embolism and thrombosis of other specified veins: Secondary | ICD-10-CM

## 2013-12-12 DIAGNOSIS — R001 Bradycardia, unspecified: Secondary | ICD-10-CM | POA: Diagnosis present

## 2013-12-12 DIAGNOSIS — I639 Cerebral infarction, unspecified: Secondary | ICD-10-CM

## 2013-12-12 DIAGNOSIS — I658 Occlusion and stenosis of other precerebral arteries: Secondary | ICD-10-CM | POA: Diagnosis present

## 2013-12-12 DIAGNOSIS — R0902 Hypoxemia: Secondary | ICD-10-CM

## 2013-12-12 DIAGNOSIS — N039 Chronic nephritic syndrome with unspecified morphologic changes: Secondary | ICD-10-CM

## 2013-12-12 DIAGNOSIS — Z87898 Personal history of other specified conditions: Secondary | ICD-10-CM

## 2013-12-12 DIAGNOSIS — I248 Other forms of acute ischemic heart disease: Secondary | ICD-10-CM | POA: Diagnosis present

## 2013-12-12 DIAGNOSIS — I2789 Other specified pulmonary heart diseases: Secondary | ICD-10-CM | POA: Diagnosis present

## 2013-12-12 DIAGNOSIS — J189 Pneumonia, unspecified organism: Principal | ICD-10-CM | POA: Diagnosis present

## 2013-12-12 LAB — CBC WITH DIFFERENTIAL/PLATELET
BASOS ABS: 0 10*3/uL (ref 0.0–0.1)
Basophils Relative: 0 % (ref 0–1)
Eosinophils Absolute: 0 10*3/uL (ref 0.0–0.7)
Eosinophils Relative: 0 % (ref 0–5)
HEMATOCRIT: 36.1 % (ref 36.0–46.0)
Hemoglobin: 12.1 g/dL (ref 12.0–15.0)
LYMPHS PCT: 7 % — AB (ref 12–46)
Lymphs Abs: 0.9 10*3/uL (ref 0.7–4.0)
MCH: 29.6 pg (ref 26.0–34.0)
MCHC: 33.5 g/dL (ref 30.0–36.0)
MCV: 88.3 fL (ref 78.0–100.0)
MONOS PCT: 4 % (ref 3–12)
Monocytes Absolute: 0.5 10*3/uL (ref 0.1–1.0)
NEUTROS PCT: 89 % — AB (ref 43–77)
Neutro Abs: 11.7 10*3/uL — ABNORMAL HIGH (ref 1.7–7.7)
Platelets: 190 10*3/uL (ref 150–400)
RBC: 4.09 MIL/uL (ref 3.87–5.11)
RDW: 16.8 % — AB (ref 11.5–15.5)
WBC MORPHOLOGY: INCREASED
WBC: 13.1 10*3/uL — AB (ref 4.0–10.5)

## 2013-12-12 LAB — MRSA PCR SCREENING: MRSA by PCR: NEGATIVE

## 2013-12-12 LAB — COMPREHENSIVE METABOLIC PANEL
ALBUMIN: 2.9 g/dL — AB (ref 3.5–5.2)
ALK PHOS: 129 U/L — AB (ref 39–117)
ALT: 35 U/L (ref 0–35)
AST: 30 U/L (ref 0–37)
BILIRUBIN TOTAL: 1.2 mg/dL (ref 0.3–1.2)
BUN: 48 mg/dL — AB (ref 6–23)
CHLORIDE: 88 meq/L — AB (ref 96–112)
CO2: 23 mEq/L (ref 19–32)
Calcium: 9.6 mg/dL (ref 8.4–10.5)
Creatinine, Ser: 6.66 mg/dL — ABNORMAL HIGH (ref 0.50–1.10)
GFR calc Af Amer: 7 mL/min — ABNORMAL LOW (ref >90)
GFR calc non Af Amer: 6 mL/min — ABNORMAL LOW (ref >90)
Glucose, Bld: 149 mg/dL — ABNORMAL HIGH (ref 70–99)
POTASSIUM: 4.5 meq/L (ref 3.7–5.3)
SODIUM: 133 meq/L — AB (ref 137–147)
Total Protein: 6.9 g/dL (ref 6.0–8.3)

## 2013-12-12 LAB — I-STAT ARTERIAL BLOOD GAS, ED
ACID-BASE EXCESS: 4 mmol/L — AB (ref 0.0–2.0)
BICARBONATE: 27.4 meq/L — AB (ref 20.0–24.0)
O2 Saturation: 84 %
PH ART: 7.492 — AB (ref 7.350–7.450)
TCO2: 29 mmol/L (ref 0–100)
pCO2 arterial: 35.8 mmHg (ref 35.0–45.0)
pO2, Arterial: 44 mmHg — ABNORMAL LOW (ref 80.0–100.0)

## 2013-12-12 LAB — I-STAT TROPONIN, ED: Troponin i, poc: 0.56 ng/mL (ref 0.00–0.08)

## 2013-12-12 LAB — LACTIC ACID, PLASMA: LACTIC ACID, VENOUS: 2.6 mmol/L — AB (ref 0.5–2.2)

## 2013-12-12 LAB — POC OCCULT BLOOD, ED: Fecal Occult Bld: POSITIVE — AB

## 2013-12-12 MED ORDER — SODIUM CHLORIDE 0.9 % IV SOLN: 250.0000 mL | INTRAVENOUS | Status: DC | PRN

## 2013-12-12 MED ORDER — RENA-VITE PO TABS
1.0000 | ORAL_TABLET | Freq: Every day | ORAL | Status: DC
  Administered 2013-12-12 – 2013-12-17 (×6): 1 via ORAL
  Filled 2013-12-12 (×7): qty 1

## 2013-12-12 MED ORDER — PENTAFLUOROPROP-TETRAFLUOROETH EX AERO: 1.0000 "application " | INHALATION_SPRAY | CUTANEOUS | Status: DC | PRN

## 2013-12-12 MED ORDER — SODIUM CHLORIDE 0.9 % IJ SOLN: 3.0000 mL | INTRAMUSCULAR | Status: DC | PRN

## 2013-12-12 MED ORDER — ALTEPLASE 2 MG IJ SOLR: 2.0000 mg | Freq: Once | INTRAMUSCULAR | Status: DC | PRN

## 2013-12-12 MED ORDER — ALBUTEROL SULFATE (2.5 MG/3ML) 0.083% IN NEBU: 2.5000 mg | INHALATION_SOLUTION | RESPIRATORY_TRACT | Status: DC | PRN

## 2013-12-12 MED ORDER — LIDOCAINE HCL (PF) 1 % IJ SOLN: 5.0000 mL | INTRAMUSCULAR | Status: DC | PRN

## 2013-12-12 MED ORDER — SODIUM CHLORIDE 0.9 % IJ SOLN
3.0000 mL | Freq: Two times a day (BID) | INTRAMUSCULAR | Status: DC
Start: 2013-12-12 — End: 2013-12-18
  Administered 2013-12-12 – 2013-12-18 (×8): 3 mL via INTRAVENOUS

## 2013-12-12 MED ORDER — SODIUM CHLORIDE 0.9 % IV SOLN: 100.0000 mL | INTRAVENOUS | Status: DC | PRN

## 2013-12-12 MED ORDER — HEPARIN SODIUM (PORCINE) 1000 UNIT/ML DIALYSIS
1000.0000 [IU] | INTRAMUSCULAR | Status: DC | PRN
Start: 2013-12-12 — End: 2013-12-15
  Filled 2013-12-12: qty 1

## 2013-12-12 MED ORDER — NEPRO/CARBSTEADY PO LIQD
237.0000 mL | Freq: Every day | ORAL | Status: DC
  Administered 2013-12-12 – 2013-12-17 (×3): 237 mL via ORAL
  Filled 2013-12-12 (×8): qty 237

## 2013-12-12 MED ORDER — ACETAMINOPHEN 325 MG PO TABS
650.0000 mg | ORAL_TABLET | Freq: Four times a day (QID) | ORAL | Status: DC | PRN
  Administered 2013-12-12: 650 mg via ORAL
  Filled 2013-12-12: qty 2

## 2013-12-12 MED ORDER — SEVELAMER CARBONATE 800 MG PO TABS
1600.0000 mg | ORAL_TABLET | Freq: Three times a day (TID) | ORAL | Status: DC
  Administered 2013-12-13 – 2013-12-18 (×11): 1600 mg via ORAL
  Filled 2013-12-12 (×20): qty 2

## 2013-12-12 MED ORDER — FAMOTIDINE 20 MG PO TABS
20.0000 mg | ORAL_TABLET | Freq: Every day | ORAL | Status: DC
  Administered 2013-12-12 – 2013-12-17 (×6): 20 mg via ORAL
  Filled 2013-12-12 (×7): qty 1

## 2013-12-12 MED ORDER — MIDODRINE HCL 5 MG PO TABS
10.0000 mg | ORAL_TABLET | Freq: Once | ORAL | Status: AC
Start: 2013-12-12 — End: 2013-12-12
  Administered 2013-12-12: 10 mg via ORAL

## 2013-12-12 MED ORDER — NEPRO/CARBSTEADY PO LIQD
237.0000 mL | ORAL | Status: DC | PRN
  Filled 2013-12-12: qty 237

## 2013-12-12 MED ORDER — ACETAMINOPHEN 650 MG RE SUPP: 650.0000 mg | Freq: Four times a day (QID) | RECTAL | Status: DC | PRN

## 2013-12-12 MED ORDER — LIDOCAINE-PRILOCAINE 2.5-2.5 % EX CREA: 1.0000 "application " | TOPICAL_CREAM | CUTANEOUS | Status: DC | PRN

## 2013-12-12 MED ORDER — ONDANSETRON HCL 4 MG/2ML IJ SOLN: 4.0000 mg | Freq: Three times a day (TID) | INTRAMUSCULAR | Status: DC | PRN

## 2013-12-12 MED ORDER — PENTAFLUOROPROP-TETRAFLUOROETH EX AERO
1.0000 "application " | INHALATION_SPRAY | CUTANEOUS | Status: DC | PRN
  Filled 2013-12-12: qty 103.5

## 2013-12-12 MED ORDER — LIDOCAINE-PRILOCAINE 2.5-2.5 % EX CREA
1.0000 "application " | TOPICAL_CREAM | CUTANEOUS | Status: DC | PRN
  Filled 2013-12-12: qty 5

## 2013-12-12 MED ORDER — ONDANSETRON HCL 4 MG/2ML IJ SOLN: 4.0000 mg | Freq: Four times a day (QID) | INTRAMUSCULAR | Status: DC | PRN

## 2013-12-12 MED ORDER — PIPERACILLIN-TAZOBACTAM IN DEX 2-0.25 GM/50ML IV SOLN
2.2500 g | Freq: Three times a day (TID) | INTRAVENOUS | Status: DC
Start: 2013-12-12 — End: 2013-12-17
  Administered 2013-12-12 – 2013-12-17 (×15): 2.25 g via INTRAVENOUS
  Filled 2013-12-12 (×20): qty 50

## 2013-12-12 MED ORDER — GUAIFENESIN ER 600 MG PO TB12
600.0000 mg | ORAL_TABLET | Freq: Two times a day (BID) | ORAL | Status: DC
  Administered 2013-12-12 – 2013-12-18 (×10): 600 mg via ORAL
  Filled 2013-12-12 (×13): qty 1

## 2013-12-12 MED ORDER — MIDODRINE HCL 5 MG PO TABS
ORAL_TABLET | ORAL | Status: AC
  Administered 2013-12-12: 10 mg via ORAL
  Filled 2013-12-12: qty 2

## 2013-12-12 MED ORDER — HEPARIN SODIUM (PORCINE) 5000 UNIT/ML IJ SOLN
5000.0000 [IU] | Freq: Three times a day (TID) | INTRAMUSCULAR | Status: DC
  Administered 2013-12-12 – 2013-12-18 (×16): 5000 [IU] via SUBCUTANEOUS
  Filled 2013-12-12 (×20): qty 1

## 2013-12-12 MED ORDER — NEPRO/CARBSTEADY PO LIQD: 237.0000 mL | ORAL | Status: DC | PRN

## 2013-12-12 MED ORDER — HEPARIN SODIUM (PORCINE) 1000 UNIT/ML DIALYSIS: 1000.0000 [IU] | INTRAMUSCULAR | Status: DC | PRN

## 2013-12-12 MED ORDER — PANTOPRAZOLE SODIUM 40 MG PO TBEC
40.0000 mg | DELAYED_RELEASE_TABLET | Freq: Two times a day (BID) | ORAL | Status: DC
  Administered 2013-12-13 – 2013-12-18 (×8): 40 mg via ORAL
  Filled 2013-12-12 (×8): qty 1

## 2013-12-12 MED ORDER — ONDANSETRON HCL 4 MG PO TABS
4.0000 mg | ORAL_TABLET | Freq: Four times a day (QID) | ORAL | Status: DC | PRN
Start: 2013-12-12 — End: 2013-12-18

## 2013-12-12 MED ORDER — ALTEPLASE 2 MG IJ SOLR
2.0000 mg | Freq: Once | INTRAMUSCULAR | Status: AC | PRN
  Filled 2013-12-12: qty 2

## 2013-12-12 MED ORDER — VANCOMYCIN HCL 10 G IV SOLR
1500.0000 mg | Freq: Once | INTRAVENOUS | Status: AC
  Administered 2013-12-12: 1500 mg via INTRAVENOUS
  Filled 2013-12-12 (×2): qty 1500

## 2013-12-12 MED ORDER — MIDODRINE HCL 5 MG PO TABS
5.0000 mg | ORAL_TABLET | ORAL | Status: DC
  Administered 2013-12-13: 5 mg via ORAL
  Filled 2013-12-12: qty 1

## 2013-12-12 MED ORDER — VANCOMYCIN HCL IN DEXTROSE 750-5 MG/150ML-% IV SOLN
750.0000 mg | INTRAVENOUS | Status: DC
Start: 2013-12-13 — End: 2013-12-15
  Administered 2013-12-13: 750 mg via INTRAVENOUS
  Filled 2013-12-12 (×6): qty 150

## 2013-12-12 NOTE — ED Notes (Signed)
Patient is a left lower extremity amputee and was using the restroom this morning and was attempting to transfer back to wheel chair and fell on her backside. Daughter was concerned because she saw bright red blood in the stool. Patient denies LOC and denies and pain. Patients only complaint is generalized weakness. CBG 171. Patient is a dialysis pt and has a hx of cardiac arrest x2.

## 2013-12-12 NOTE — ED Provider Notes (Signed)
68 year old female status post amputation of the lower extremity presents with a complaint of a fall from her wheelchair while transferring. Incidentally the patient was noted to be somewhat short of breath and hypoxic on arrival and on exam has bilateral rales and mild wheezing. She is not using accessory muscles and is able to speak in full sentences but pulmonary infiltrates are seen on the chest x-ray. She will need to be admitted to the hospital for further treatment and evaluation.  Medical screening examination/treatment/procedure(s) were conducted as a shared visit with non-physician practitioner(s) and myself.  I personally evaluated the patient during the encounter.       Vida RollerBrian D Pamla Pangle, MD 12/13/13 [id number]

## 2013-12-12 NOTE — Progress Notes (Signed)
ANTIBIOTIC CONSULT NOTE - INITIAL  Pharmacy Consult for Vancomycin and Zosyn  Indication: rule out pneumonia  Allergies  Allergen Reactions  . Phenergan [Promethazine Hcl] Other (See Comments)    Causes patient to" vomit more"  . Vitamin D Analogs Rash    Patient Measurements: Height: 5' 6.14" (168 cm) Weight: 181 lb (82.101 kg) IBW/kg (Calculated) : 59.63  Vital Signs: Temp: 98.1 F (36.7 C) (04/05 0558) Temp src: Oral (04/05 0558) BP: 125/63 mmHg (04/05 0719) Pulse Rate: 98 (04/05 0719) Intake/Output from previous day:   Intake/Output from this shift:    Labs: No results found for this basename: WBC, HGB, PLT, LABCREA, CREATININE,  in the last 72 hours Estimated Creatinine Clearance: 10 ml/min (by C-G formula based on Cr of 5.85). No results found for this basename: VANCOTROUGH, Leodis BinetVANCOPEAK, VANCORANDOM, GENTTROUGH, GENTPEAK, GENTRANDOM, TOBRATROUGH, TOBRAPEAK, TOBRARND, AMIKACINPEAK, AMIKACINTROU, AMIKACIN,  in the last 72 hours   Microbiology: Recent Results (from the past 720 hour(s))  MRSA PCR SCREENING     Status: None   Collection Time    11/19/13  3:55 PM      Result Value Ref Range Status   MRSA by PCR NEGATIVE  NEGATIVE Final   Comment:            The GeneXpert MRSA Assay (FDA     approved for NASAL specimens     only), is one component of a     comprehensive MRSA colonization     surveillance program. It is not     intended to diagnose MRSA     infection nor to guide or     monitor treatment for     MRSA infections.    Medical History: Past Medical History  Diagnosis Date  . PAD (peripheral artery disease)     a. L BKA  . Type II diabetes mellitus   . Anemia   . History of blood transfusion 2002    "related to amputation" (08/13/2013)  . End stage renal disease on dialysis     [facility]" (08/13/2013)  . DVT (deep venous thrombosis)     a. R IJ DVT 08/2013.  Marland Kitchen. CVA (cerebral infarction)     a. MRI 08/2013: possible left  motor strip infarct with remote pontine infarct.  . Pulmonary HTN     a. Cath 2011 - mod to severe pulmonary hypertension. b. Echo 08/2013: PAP 45mmHg (mild-mod increased).  . Thrombocytopenia   . CAD (coronary artery disease)     a. Severe diffuse diabetic disease by cath 2011, for med rx, not a candidate for CABG or PCI at that time.  . Aortic stenosis     mild  . Syncope     a. 2011, etiology never clear.  . Carotid artery disease     a. Dopp 16/109612/2014: 1-39% LICA< 60-79% RICA borderline >80%.  . Cerebrovascular disease     a. 08/2013 MRA brain: multiple bilateral moderate and high grade intracranial stenoses  . Cardiac arrest 08/14/13    a. Possible arrest in setting of hypotension during dialysis with possible arrest requiring brief CPR, not intubated and was fully intact after episode. Suspected due to low cerebral perfusion in setting of hypotension. Unclear if any arrhythmia.  . End stage renal disease on dialysis   . Peripheral arterial occlusive disease   . Hypertension   . Diabetes mellitus type 2, uncontrolled, with complications   . DVT (deep venous thrombosis)      Right internal  jugular vein  . CAD (coronary artery disease), native coronary artery 2011    Diffuse distal vessel CAD noted at cardiac catheterization  . MI (myocardial infarction)   . Stroke     Medications:  Pepcid  Amaryl  Midodrine  Protonix  Renvela    Assessment: 68 yo female with cough/weakness, possible PNA, for empiric antibiotics  Goal of Therapy:  Vancomycin pre-HD level 15-25  Plan:  Vancomycin 1500 mg IV now, then 750 mg IV after each HD Zosyn 2.25 g IV q8h  Eddie Candle 12/12/2013,7:35 AM

## 2013-12-12 NOTE — ED Notes (Signed)
MD at bedside. 

## 2013-12-12 NOTE — ED Notes (Signed)
Due to pt O2 stats 89% pt placed on 2lpm O2 by [place]

## 2013-12-12 NOTE — Progress Notes (Signed)
Admitted from hemodialysis, arousable, answer questions and follow commands then go to sleep. Incontinent of stool brown in color in mod. Amount, cleansed and kept dry.

## 2013-12-12 NOTE — Consult Note (Signed)
[facility] Renal Consultation Note    Indication for Consultation:  Management of ESRD/hemodialysis; anemia, hypertension/volume and secondary hyperparathyroidism PCP:  HPI: Briana Gould is a 68 y.o. female with ESRD on MWF dialysis with hx carioresp arrests on dialysis, inoperable/uninterventionable CAD, hx CVA who presented to the ED this am with weakness during a transfer from her W/C.  Pt has had a cough x 5 days. Evaluation in the ED showed a WBC at 13K with 89% PMNs significantly up from last admission. CXR showed diffuse bilateral patchy infiltrates. Pt became more SOB in the ED and ABG showed pH 7.49, Po2 44 pCO2 36 O2 sats 84%. She was placed on a NRB mask with improvement in sats to 96%. She left 2.7 above EDW Friday which actually had been better than her past hour treatments. Had had no fevers at dialysis last week.   She was taken emergently to the HD unit for ultrafiltration and dialysis.  Past Medical History  Diagnosis Date  . PAD (peripheral artery disease)     a. L BKA  . Type II diabetes mellitus   . Anemia   . History of blood transfusion 2002    "related to amputation" (08/13/2013)  . End stage renal disease on dialysis     "[facility]; M, W, F" (08/13/2013)  . DVT (deep venous thrombosis)     a. R IJ DVT 08/2013.  Marland Kitchen. CVA (cerebral infarction)     a. MRI 08/2013: possible left motor strip infarct with remote pontine infarct.  . Pulmonary HTN     a. Cath 2011 - mod to severe pulmonary hypertension. b. Echo 08/2013: PAP 45mmHg (mild-mod increased).  . Thrombocytopenia   . CAD (coronary artery disease)     a. Severe diffuse diabetic disease by cath 2011, for med rx, not a candidate for CABG or PCI at that time.  . Aortic stenosis     mild  . Syncope     a. 2011, etiology never clear.  . Carotid artery disease     a. Dopp 04/540912/2014: 1-39% LICA< 60-79% RICA borderline >80%.  . Cerebrovascular disease     a. 08/2013 MRA brain: multiple  bilateral moderate and high grade intracranial stenoses  . Cardiac arrest 08/14/13    a. Possible arrest in setting of hypotension during dialysis with possible arrest requiring brief CPR, not intubated and was fully intact after episode. Suspected due to low cerebral perfusion in setting of hypotension. Unclear if any arrhythmia.  . End stage renal disease on dialysis   . Peripheral arterial occlusive disease   . Hypertension   . Diabetes mellitus type 2, uncontrolled, with complications   . DVT (deep venous thrombosis)      Right internal jugular vein  . CAD (coronary artery disease), native coronary artery 2011    Diffuse distal vessel CAD noted at cardiac catheterization  . MI (myocardial infarction)   . Stroke    Past Surgical History  Procedure Laterality Date  . Av fistula placement Left 2011    upper arm  . Below knee leg amputation Left 2002  . Tonsillectomy    . Cataract extraction w/ intraocular lens  implant, bilateral Bilateral 2012-2014  . Esophagogastroduodenoscopy N/A 08/30/2013    Procedure: ESOPHAGOGASTRODUODENOSCOPY (EGD);  Surgeon: Theda BelfastPatrick D Hung, MD;  Location[facility];  Service: Endoscopy;  Laterality: N/A;  . Below knee leg amputation Left   . Av fistula placement Left    Family History  Problem Relation Age  of Onset  . Hypertension Mother   . Hypertension Father   . Heart attack Brother   . Stroke Mother   . Pulmonary embolism Father    Social History:  reports that she has never smoked. She does not have any smokeless tobacco history on file. She reports that she does not drink alcohol or use illicit drugs. Allergies  Allergen Reactions  . Phenergan [Promethazine Hcl] Other (See Comments)    Causes patient to" vomit more"  . Vitamin D Analogs Rash   Prior to Admission medications   Medication Sig Start Date End Date Taking? Authorizing Provider  b complex-vitamin c-folic acid (NEPHRO-VITE) 0.8 MG TABS tablet Take 1 tablet by mouth daily.   Yes  Historical Provider, MD  famotidine (PEPCID) 20 MG tablet Take 1 tablet (20 mg total) by mouth at bedtime. 10/25/13  Yes Evlyn KannerPamela S Love, PA-C  glimepiride (AMARYL) 1 MG tablet Take 1 tablet (1 mg total) by mouth daily with breakfast. 10/25/13  Yes Evlyn KannerPamela S Love, PA-C  midodrine (PROAMATINE) 5 MG tablet Take 1 tablet (5 mg total) by mouth every Monday, Wednesday, and Friday with hemodialysis. 10/25/13  Yes Evlyn KannerPamela S Love, PA-C  Nutritional Supplements (FEEDING SUPPLEMENT, NEPRO CARB STEADY,) LIQD Take 237 mLs by mouth daily.   Yes Historical Provider, MD  pantoprazole (PROTONIX) 40 MG tablet Take 1 tablet (40 mg total) by mouth 2 (two) times daily. 10/25/13  Yes Evlyn KannerPamela S Love, PA-C  sevelamer carbonate (RENVELA) 800 MG tablet Take 1,600 mg by mouth 3 (three) times daily with meals.    Yes Historical Provider, MD   Current Facility-Administered Medications  Medication Dose Route Frequency Provider Last Rate Last Dose  . 0.9 %  sodium chloride infusion  100 mL Intravenous PRN Sheffield SliderMartha B. Karlisa Gaubert, PA-C      . 0.9 %  sodium chloride infusion  100 mL Intravenous PRN Sheffield SliderMartha B. Robbie Nangle, PA-C      . 0.9 %  sodium chloride infusion  100 mL Intravenous PRN Sheffield SliderMartha B. Barth Trella, PA-C      . 0.9 %  sodium chloride infusion  100 mL Intravenous PRN Sheffield SliderMartha B. Kennedee Kitzmiller, PA-C      . alteplase (CATHFLO ACTIVASE) injection 2 mg  2 mg Intracatheter Once PRN Sheffield SliderMartha B. Davena Julian, PA-C      . alteplase (CATHFLO ACTIVASE) injection 2 mg  2 mg Intracatheter Once PRN Sheffield SliderMartha B. Rielynn Trulson, PA-C      . feeding supplement (NEPRO CARB STEADY) liquid 237 mL  237 mL Oral PRN Sheffield SliderMartha B. Yaacov Koziol, PA-C      . feeding supplement (NEPRO CARB STEADY) liquid 237 mL  237 mL Oral PRN Sheffield SliderMartha B. Krina Mraz, PA-C      . heparin injection 1,000 Units  1,000 Units Dialysis PRN Sheffield SliderMartha B. Taquita Demby, PA-C      . heparin injection 1,000 Units  1,000 Units Dialysis PRN Sheffield SliderMartha B. Chastidy Ranker, PA-C      . lidocaine (PF) (XYLOCAINE) 1 % injection 5 mL  5 mL Intradermal PRN  Sheffield SliderMartha B. Lillard Bailon, PA-C      . lidocaine (PF) (XYLOCAINE) 1 % injection 5 mL  5 mL Intradermal PRN Sheffield SliderMartha B. Coral Soler, PA-C      . lidocaine-prilocaine (EMLA) cream 1 application  1 application Topical PRN Sheffield SliderMartha B. Mateusz Neilan, PA-C      . lidocaine-prilocaine (EMLA) cream 1 application  1 application Topical PRN Sheffield SliderMartha B. Elvie Maines, PA-C      . ondansetron (ZOFRAN) injection 4 mg  4 mg Intravenous Q8H  PRN Renne Crigler, PA-C      . pentafluoroprop-tetrafluoroeth (GEBAUERS) aerosol 1 application  1 application Topical PRN Sheffield Slider, PA-C      . pentafluoroprop-tetrafluoroeth (GEBAUERS) aerosol 1 application  1 application Topical PRN Sheffield Slider, PA-C      . piperacillin-tazobactam (ZOSYN) IVPB 2.25 g  2.25 g Intravenous 3 times per day Vida Roller, MD   2.25 g at 12/12/13 0824  . [START ON 12/13/2013] vancomycin (VANCOCIN) IVPB 750 mg/150 ml premix  750 mg Intravenous Q M,W,F-HD Vida Roller, MD       Current Outpatient Prescriptions  Medication Sig Dispense Refill  . b complex-vitamin c-folic acid (NEPHRO-VITE) 0.8 MG TABS tablet Take 1 tablet by mouth daily.      . famotidine (PEPCID) 20 MG tablet Take 1 tablet (20 mg total) by mouth at bedtime.  30 tablet  1  . glimepiride (AMARYL) 1 MG tablet Take 1 tablet (1 mg total) by mouth daily with breakfast.  30 tablet  1  . midodrine (PROAMATINE) 5 MG tablet Take 1 tablet (5 mg total) by mouth every Monday, Wednesday, and Friday with hemodialysis.  30 tablet  1  . Nutritional Supplements (FEEDING SUPPLEMENT, NEPRO CARB STEADY,) LIQD Take 237 mLs by mouth daily.      . pantoprazole (PROTONIX) 40 MG tablet Take 1 tablet (40 mg total) by mouth 2 (two) times daily.  60 tablet  1  . sevelamer carbonate (RENVELA) 800 MG tablet Take 1,600 mg by mouth 3 (three) times daily with meals.        Labs: Basic Metabolic Panel:  Recent Labs Lab 12/12/13 0635  NA 133*  K 4.5  CL 88*  CO2 23  GLUCOSE 149*  BUN 48*  CREATININE 6.66*  CALCIUM  9.6   Liver Function Tests:  Recent Labs Lab 12/12/13 0635  AST 30  ALT 35  ALKPHOS 129*  BILITOT 1.2  PROT 6.9  ALBUMIN 2.9*  CBC:  Recent Labs Lab 12/12/13 0635  WBC 13.1*  NEUTROABS 11.7*  HGB 12.1  HCT 36.1  MCV 88.3  PLT 190  Studies/Results: Dg Chest 2 View  12/12/2013   CLINICAL DATA:  Cough  EXAM: CHEST  2 VIEW  COMPARISON:  11/19/2013  FINDINGS: Cardiac shadow remains enlarged. Patchy infiltrative changes are noted throughout both lungs but most prominent in the right middle and lower lobes. No acute bony abnormality is seen.  IMPRESSION: Diffuse bilateral patchy infiltrates. Followup films are recommended following appropriate therapy.   Electronically Signed   By: Alcide Clever M.D.   On: 12/12/2013 07:12    ROS: Limited to SOB which talking  Physical Exam: Filed Vitals:   12/12/13 1050 12/12/13 1135 12/12/13 1216 12/12/13 1235  BP: 130/56 147/73 143/75 153/78  Pulse: 80 80 82 79  Temp:  99.3 F (37.4 C)    TempSrc:  Oral    Resp: 26 26 26 24   Height:      Weight:      SpO2: 96% 100% 100% 100%     General: Well developed,very  SOB with talking, frequent coughing Head: Normocephalic, atraumatic, sclera non-icteric,NRB in place Neck: Supple. JVD elevated. Lungs: . Diffuse wheezes and rales breathing is labored using accessory muscles to breath; Heart: RRR  Abdomen: Soft, non-tender, non-distended with normoactive bowel sounds. No rebound/guarding. Lower extremities: left BKA no significant edema; right LE ++edema Neuro: Rousable but sleepy. Moves all extremities spontaneously. Dialysis Access: left AVF   Dialysis Orders: MWF [place]  4h (250/800 Buttonhole lower part AVF or sharp needle top section Heparin none EPO 5800  Recent labs;  Hgb 11.7 4/02, iPTH 455 Ca 8.7 P 2.1 - no Hectorol - had been on hold since some time in March - apparently due to hypercalcemia - prev dose was 8; Not getting to edw- post HD weight 82.8 3/27, 83.4 3/30, 80.8 4/02 79.7  4/03+++max goals have been 2 L on HD and has been dialyzing 4 x a week at times due to volume. - Also noted  Was a pre HD of 3.3 on 3/30 - while on a 2 K bath   Assessment/Plan: 1. SOB/volume overload with HCAP  - on empiric Vanc and Zosyn; low BP may limit fluid removal; plans to sycle enzymes - initial troponin elevated at 0.56 - ? Demand ischemia - no acute change on adm EKG. 2. ESRD -  MWF - max goals of 2 L during tmts; usually requiring 4 x per week HD due to volume; last HD 4/03; K 4.5  - plan serial HD to lower volume gently - orders written for Monday 3. Hypertension/volume  - needs decrease volume - set goal for 2.5 - today - likely will  need more UF on Monday - plan for repeat treatment - one time dose of midodrine 10 mg per Dr. Allena Katz.; BPs lower than usual outpt pre HD BPs of 170 - 180s.- needs strict fluid restriction - d/c on 5 midodrine last admission; metoprolol 12.5 bid 4. Anemia  - Has been stable in 11s on 5800 Epo - Hgb 11.7 today with excess volume - hold on initiating Aranesp - follow trends 5. Metabolic bone disease -  Hectorol has been on hold due to hypercalcemia and on 2 Ca bath; Ca much higher here at 9.6  - sevelmer 2 ac - last outpt P was low - recheck with am pre HD labs - to direct dosing.  6. Nutrition - needs renal carb mod diet 7. Hx of recurrent cardioresp arrests, severe ASCVD with left ICA stenosis/severe pul HTN and Advanced CAD - noninterventional or surgical candidate; pt not interested in palliative care - full code - though extremely fragile. 8. Hx GIB - on no heparin HD - Hgb stable 9. DM - per primary  ADDENDUM:  1305 Pt on HD noted increasing bigeminy and trigeminy.  V5 on tele  showing diffuse ST elevation. Earlier EKG showes slight ST depression. Stat EKG called - EKG no sig change from earlier this am.Goal decreased to 1500 on HD  Primary notified. MBB  Sheffield Slider, PA-C [employer] [phone] 12/12/2013, 1:00 PM

## 2013-12-12 NOTE — Consult Note (Signed)
I have personally seen and examined this patient and agree with the assessment/plan as outlined [facility] by [facility]. Ms.Gonyer has an incredibly complex past medical history and has been very challenging to manage on dialysis. She presents with a cough for 5 days and shortness of breath since last night- the latter appears to be from a combination of PNA and volume overload/pulmonary edema- Will do emergent dialysis after pre-medicating with midodrine to sustain blood pressures higher as she tends to have dysrhythmias at hypotensive episodes.  Antiono Ettinger K.,MD 12/12/2013 12:06 PM

## 2013-12-12 NOTE — H&P (Addendum)
[facility] History and Physical  Briana Gould ZOX:[id number] DOB: 1946/06/25 DOA: 12/12/2013  Referring physician: ED PA. PCP: [name], MD   Chief Complaint: SOB, Fall.   HPI: Briana Gould is a 68 y.o. female with significant PMH Diabetes, CAD, ESRD, Carotid stenosis, multiple admission for Cardiac Arrest who presents after a fall. Patient relates that she felt weak, and fell down. Denies loss of consciousness. She didn't hit her head. She denies joint pain. She also relates worsening cough for last week, productive. Denies fever. She denies chest pain. She has been more sleepy per daughter.    Review of Systems:  Negative except as per HPI.    Past Medical History  Diagnosis Date  . PAD (peripheral artery disease)     a. L BKA  . Type II diabetes mellitus   . Anemia   . History of blood transfusion 2002    "related to amputation" (08/13/2013)  . End stage renal disease on dialysis     "[facility]; M, W, F" (08/13/2013)  . DVT (deep venous thrombosis)     a. R IJ DVT 08/2013.  Marland Kitchen CVA (cerebral infarction)     a. MRI 08/2013: possible left motor strip infarct with remote pontine infarct.  . Pulmonary HTN     a. Cath 2011 - mod to severe pulmonary hypertension. b. Echo 08/2013: PAP (mild-mod increased).  . Thrombocytopenia   . CAD (coronary artery disease)     a. Severe diffuse diabetic disease by cath 2011, for med rx, not a candidate for CABG or PCI at that time.  . Aortic stenosis     mild  . Syncope     a. 2011, etiology never clear.  . Carotid artery disease     a. Dopp 81/1914: 1-39% LICA< 60-79% RICA borderline >80%.  . Cerebrovascular disease     a. 08/2013 MRA brain: multiple bilateral moderate and high grade intracranial stenoses  . Cardiac arrest 08/14/13    a. Possible arrest in setting of hypotension during dialysis with possible arrest requiring brief CPR, not intubated and was fully intact after episode.  Suspected due to low cerebral perfusion in setting of hypotension. Unclear if any arrhythmia.  . End stage renal disease on dialysis   . Peripheral arterial occlusive disease   . Hypertension   . Diabetes mellitus type 2, uncontrolled, with complications   . DVT (deep venous thrombosis)      Right internal jugular vein  . CAD (coronary artery disease), native coronary artery 2011    Diffuse distal vessel CAD noted at cardiac catheterization  . MI (myocardial infarction)   . Stroke    Past Surgical History  Procedure Laterality Date  . Av fistula placement Left 2011    upper arm  . Below knee leg amputation Left 2002  . Tonsillectomy    . Cataract extraction w/ intraocular lens  implant, bilateral Bilateral 2012-2014  . Esophagogastroduodenoscopy N/A 08/30/2013    Procedure: ESOPHAGOGASTRODUODENOSCOPY (EGD);  Surgeon: Theda Belfast, MD;  Location: [facility] ENDOSCOPY;  Service: Endoscopy;  Laterality: N/A;  . Below knee leg amputation Left   . Av fistula placement Left    Social History:  reports that she has never smoked. She does not have any smokeless tobacco history on file. She reports that she does not drink alcohol or use illicit drugs.  Allergies  Allergen Reactions  . Phenergan [Promethazine Hcl] Other (See Comments)    Causes patient to" vomit more"  .  Vitamin D Analogs Rash    Family History  Problem Relation Age of Onset  . Hypertension Mother   . Hypertension Father   . Heart attack Brother   . Stroke Mother   . Pulmonary embolism Father      Prior to Admission medications   Medication Sig Start Date End Date Taking? Authorizing Provider  b complex-vitamin c-folic acid (NEPHRO-VITE) 0.8 MG TABS tablet Take 1 tablet by mouth daily.   Yes Historical Provider, MD  famotidine (PEPCID) 20 MG tablet Take 1 tablet (20 mg total) by mouth at bedtime. 10/25/13  Yes Evlyn KannerPamela S Love, PA-C  glimepiride (AMARYL) 1 MG tablet Take 1 tablet (1 mg total) by mouth daily with breakfast.  10/25/13  Yes Evlyn KannerPamela S Love, PA-C  midodrine (PROAMATINE) 5 MG tablet Take 1 tablet (5 mg total) by mouth every Monday, Wednesday, and Friday with hemodialysis. 10/25/13  Yes Evlyn KannerPamela S Love, PA-C  Nutritional Supplements (FEEDING SUPPLEMENT, NEPRO CARB STEADY,) LIQD Take 237 mLs by mouth daily.   Yes Historical Provider, MD  pantoprazole (PROTONIX) 40 MG tablet Take 1 tablet (40 mg total) by mouth 2 (two) times daily. 10/25/13  Yes Evlyn KannerPamela S Love, PA-C  sevelamer carbonate (RENVELA) 800 MG tablet Take 1,600 mg by mouth 3 (three) times daily with meals.    Yes Historical Provider, MD   Physical Exam: Filed Vitals:   12/12/13 0730  BP: 140/73  Pulse: 86  Temp:   Resp: 32    BP 140/73  Pulse 86  Temp(Src) 98.1 F (36.7 C) (Oral)  Resp 32  Ht 5' 6.14" (1.68 m)  Wt 82.101 kg (181 lb)  BMI 29.09 kg/m2  SpO2 81%  General:  Appears calm and comfortable, lethargic, wake up to answer questions, follow command.  Eyes: PERRL, normal lids, irises & conjunctiva ENT: grossly normal hearing, lips & tongue Neck: no LAD, masses or thyromegaly Cardiovascular: RRR, no m/r/g. No LE edema. Telemetry: SR, no arrhythmias  Respiratory: Bilateral diffuse ronchus and crackles,. Normal respiratory effort. Abdomen: soft, ntnd Skin: no rash or induration seen on limited exam Musculoskeletal: left BKA Neurologic: Lethargic, follow command. grossly non-focal.          Labs on Admission:  Basic Metabolic Panel:  Recent Labs Lab 12/12/13 0635  NA 133*  K 4.5  CL 88*  CO2 23  GLUCOSE 149*  BUN 48*  CREATININE 6.66*  CALCIUM 9.6   Liver Function Tests:  Recent Labs Lab 12/12/13 0635  AST 30  ALT 35  ALKPHOS 129*  BILITOT 1.2  PROT 6.9  ALBUMIN 2.9*   No results found for this basename: LIPASE, AMYLASE,  in the last 168 hours No results found for this basename: AMMONIA,  in the last 168 hours CBC:  Recent Labs Lab 12/12/13 0635  WBC 13.1*  NEUTROABS 11.7*  HGB 12.1  HCT 36.1  MCV  88.3  PLT 190   Cardiac Enzymes: No results found for this basename: CKTOTAL, CKMB, CKMBINDEX, TROPONINI,  in the last 168 hours  BNP (last 3 results)  Recent Labs  09/20/13 0913 09/20/13 1155  PROBNP 57457.0* 47172.0*   CBG: No results found for this basename: GLUCAP,  in the last 168 hours  Radiological Exams on Admission: Dg Chest 2 View  12/12/2013   CLINICAL DATA:  Cough  EXAM: CHEST  2 VIEW  COMPARISON:  11/19/2013  FINDINGS: Cardiac shadow remains enlarged. Patchy infiltrative changes are noted throughout both lungs but most prominent in the right middle and lower  lobes. No acute bony abnormality is seen.  IMPRESSION: Diffuse bilateral patchy infiltrates. Followup films are recommended following appropriate therapy.   Electronically Signed   By: Alcide Clever M.D.   On: 12/12/2013 07:12    EKG: Independently reviewed. Left BBB, no ST elevation.   Assessment/Plan Active Problems:   HCAP (healthcare-associated pneumonia)   PNA (pneumonia)   1-Acute respiratory Failure; in setting of PNA, and Heart Failure. Oxygen supplementation, IV antibiotics. Nephrologist will be contacted by [facility]. Will also check ABG. Check lactic acid.  ABG with Hypoxemia; will contact renal. Patient might need dialysis.   2-Health Care Associated PNA; Patient presents with leukocytosis, infiltrates chest x ray.  Continue with vancomycin and Zosyn. Will check blood culture, sputum culture.   3-Encephalopathy; patient lethargic, easy arouse. Answer questions. Check ABG.   4-Elevated troponin; probably demand ischemia. Setting PNA, renal failure. Cardiology consulted. Patient no PCI candidate due to high risk for bleed.  5-ESRD;renal consulted for dialysis.  6-Fall; denies head trauma. Denies joint pain.   Code Status: Per daughter patient is full code. Last conversation with her Sheral Flow, she wishes to be full code.  Family Communication: Care discussed with Daughter.  Disposition Plan: expect more  than 3 to 4 days inpatient.   Time spent: 75 minutes.   Hartley Barefoot A [facility] Pager [phone]

## 2013-12-12 NOTE — ED Notes (Signed)
Abnormal labs given to PA- Renne CriglerJoshua Geiple

## 2013-12-12 NOTE — Consult Note (Addendum)
[facility] Renal Consultation Note    Indication for Consultation:  Management of ESRD/hemodialysis; anemia, hypertension/volume and secondary hyperparathyroidism PCP:  HPI: Briana Gould is a 68 y.o. female with ESRD on MWF dialysis with hx carioresp arrests on dialysis, inoperable/uninterventionable CAD, hx CVA who presented to the ED this am with weakness during a transfer from her W/C.  Pt has had a cough x 5 days. Evaluation in the ED showed a WBC at 13K with 89% PMNs significantly up from last admission. CXR showed diffuse bilateral patchy infiltrates. Pt became more SOB in the ED and ABG showed pH 7.49, Po2 44 pCO2 36 O2 sats 84%. She was placed on a NRB mask with improvement in sats to 96%. She left 2.7 above EDW Friday which actually had been better than her past hour treatments. Had had no fevers at dialysis last week.   She was taken emergently to the HD unit for ultrafiltration and dialysis.  Past Medical History  Diagnosis Date  . PAD (peripheral artery disease)     a. L BKA  . Type II diabetes mellitus   . Anemia   . History of blood transfusion 2002    "related to amputation" (08/13/2013)  . End stage renal disease on dialysis     "[facility]; M, W, F" (08/13/2013)  . DVT (deep venous thrombosis)     a. R IJ DVT 08/2013.  Marland Kitchen. CVA (cerebral infarction)     a. MRI 08/2013: possible left motor strip infarct with remote pontine infarct.  . Pulmonary HTN     a. Cath 2011 - mod to severe pulmonary hypertension. b. Echo 08/2013: PAP 45mmHg (mild-mod increased).  . Thrombocytopenia   . CAD (coronary artery disease)     a. Severe diffuse diabetic disease by cath 2011, for med rx, not a candidate for CABG or PCI at that time.  . Aortic stenosis     mild  . Syncope     a. 2011, etiology never clear.  . Carotid artery disease     a. Dopp 04/540912/2014: 1-39% LICA< 60-79% RICA borderline >80%.  . Cerebrovascular disease     a. 08/2013 MRA brain: multiple  bilateral moderate and high grade intracranial stenoses  . Cardiac arrest 08/14/13    a. Possible arrest in setting of hypotension during dialysis with possible arrest requiring brief CPR, not intubated and was fully intact after episode. Suspected due to low cerebral perfusion in setting of hypotension. Unclear if any arrhythmia.  . End stage renal disease on dialysis   . Peripheral arterial occlusive disease   . Hypertension   . Diabetes mellitus type 2, uncontrolled, with complications   . DVT (deep venous thrombosis)      Right internal jugular vein  . CAD (coronary artery disease), native coronary artery 2011    Diffuse distal vessel CAD noted at cardiac catheterization  . MI (myocardial infarction)   . Stroke    Past Surgical History  Procedure Laterality Date  . Av fistula placement Left 2011    upper arm  . Below knee leg amputation Left 2002  . Tonsillectomy    . Cataract extraction w/ intraocular lens  implant, bilateral Bilateral 2012-2014  . Esophagogastroduodenoscopy N/A 08/30/2013    Procedure: ESOPHAGOGASTRODUODENOSCOPY (EGD);  Surgeon: Theda BelfastPatrick D Hung, MD;  Location[facility] END[facility]COPY;  Service: Endoscopy;  Laterality: N/A;  . Below knee leg amputation Left   . Av fistula placement Left    Family History  Problem Relation Age  of Onset  . Hypertension Mother   . Hypertension Father   . Heart attack Brother   . Stroke Mother   . Pulmonary embolism Father    Social History:  reports that she has never smoked. She does not have any smokeless tobacco history on file. She reports that she does not drink alcohol or use illicit drugs. Allergies  Allergen Reactions  . Phenergan [Promethazine Hcl] Other (See Comments)    Causes patient to" vomit more"  . Vitamin D Analogs Rash   Prior to Admission medications   Medication Sig Start Date End Date Taking? Authorizing Provider  b complex-vitamin c-folic acid (NEPHRO-VITE) 0.8 MG TABS tablet Take 1 tablet by mouth daily.   Yes  Historical Provider, MD  famotidine (PEPCID) 20 MG tablet Take 1 tablet (20 mg total) by mouth at bedtime. 10/25/13  Yes Evlyn Kanner Love, PA-C  glimepiride (AMARYL) 1 MG tablet Take 1 tablet (1 mg total) by mouth daily with breakfast. 10/25/13  Yes Evlyn Kanner Love, PA-C  midodrine (PROAMATINE) 5 MG tablet Take 1 tablet (5 mg total) by mouth every Monday, Wednesday, and Friday with hemodialysis. 10/25/13  Yes Evlyn Kanner Love, PA-C  Nutritional Supplements (FEEDING SUPPLEMENT, NEPRO CARB STEADY,) LIQD Take 237 mLs by mouth daily.   Yes Historical Provider, MD  pantoprazole (PROTONIX) 40 MG tablet Take 1 tablet (40 mg total) by mouth 2 (two) times daily. 10/25/13  Yes Evlyn Kanner Love, PA-C  sevelamer carbonate (RENVELA) 800 MG tablet Take 1,600 mg by mouth 3 (three) times daily with meals.    Yes Historical Provider, MD   Current Facility-Administered Medications  Medication Dose Route Frequency Provider Last Rate Last Dose  . ondansetron (ZOFRAN) injection 4 mg  4 mg Intravenous Q8H PRN Renne Crigler, PA-C      . piperacillin-tazobactam (ZOSYN) IVPB 2.25 g  2.25 g Intravenous 3 times per day Vida Roller, MD   2.25 g at 12/12/13 0824  . vancomycin (VANCOCIN) 1,500 mg in sodium chloride 0.9 % 500 mL IVPB  1,500 mg Intravenous Once Vida Roller, MD 250 mL/hr at 12/12/13 0914 1,500 mg at 12/12/13 0914  . [START ON 12/13/2013] vancomycin (VANCOCIN) IVPB 750 mg/150 ml premix  750 mg Intravenous Q M,W,F-HD Vida Roller, MD       Current Outpatient Prescriptions  Medication Sig Dispense Refill  . b complex-vitamin c-folic acid (NEPHRO-VITE) 0.8 MG TABS tablet Take 1 tablet by mouth daily.      . famotidine (PEPCID) 20 MG tablet Take 1 tablet (20 mg total) by mouth at bedtime.  30 tablet  1  . glimepiride (AMARYL) 1 MG tablet Take 1 tablet (1 mg total) by mouth daily with breakfast.  30 tablet  1  . midodrine (PROAMATINE) 5 MG tablet Take 1 tablet (5 mg total) by mouth every Monday, Wednesday, and Friday with  hemodialysis.  30 tablet  1  . Nutritional Supplements (FEEDING SUPPLEMENT, NEPRO CARB STEADY,) LIQD Take 237 mLs by mouth daily.      . pantoprazole (PROTONIX) 40 MG tablet Take 1 tablet (40 mg total) by mouth 2 (two) times daily.  60 tablet  1  . sevelamer carbonate (RENVELA) 800 MG tablet Take 1,600 mg by mouth 3 (three) times daily with meals.        Labs: Basic Metabolic Panel:  Recent Labs Lab 12/12/13 0635  NA 133*  K 4.5  CL 88*  CO2 23  GLUCOSE 149*  BUN 48*  CREATININE 6.66*  CALCIUM 9.6   Liver Function Tests:  Recent Labs Lab 12/12/13 0635  AST 30  ALT 35  ALKPHOS 129*  BILITOT 1.2  PROT 6.9  ALBUMIN 2.9*  CBC:  Recent Labs Lab 12/12/13 0635  WBC 13.1*  NEUTROABS 11.7*  HGB 12.1  HCT 36.1  MCV 88.3  PLT 190  Studies/Results: Dg Chest 2 View  12/12/2013   CLINICAL DATA:  Cough  EXAM: CHEST  2 VIEW  COMPARISON:  11/19/2013  FINDINGS: Cardiac shadow remains enlarged. Patchy infiltrative changes are noted throughout both lungs but most prominent in the right middle and lower lobes. No acute bony abnormality is seen.  IMPRESSION: Diffuse bilateral patchy infiltrates. Followup films are recommended following appropriate therapy.   Electronically Signed   By: Alcide Clever M.D.   On: 12/12/2013 07:12    ROS: Limited to SOB which talking  Physical Exam: Filed Vitals:   12/12/13 0615 12/12/13 0719 12/12/13 0730 12/12/13 0943  BP: 148/74 125/63 140/73 115/79  Pulse: 85 98 86 81  Temp:      TempSrc:      Resp: 10 24 32 28  Height:  5' 6.14" (1.68 m)    Weight:  82.101 kg (181 lb)    SpO2: 95% 100% 81% 97%     General: Well developed,very  SOB with talking, frequent coughing Head: Normocephalic, atraumatic, sclera non-icteric,NRB in place Neck: Supple. JVD elevated. Lungs: . Diffuse wheezes and rales breathing is labored using accessory muscles to breath; Heart: RRR  Abdomen: Soft, non-tender, non-distended with normoactive bowel sounds. No  rebound/guarding. Lower extremities: left BKA no significant edema; right LE ++edema Neuro: Rousable but sleepy. Moves all extremities spontaneously. Dialysis Access: left AVF   Dialysis Orders: MWF [place] 4h (250/800 Buttonhole lower part AVF or sharp needle top section Heparin none EPO 5800  Recent labs;  Hgb 11.7 4/02, iPTH 455 Ca 8.7 P 2.1 - no Hectorol - had been on hold since some time in March - apparently due to hypercalcemia - prev dose was 8; Not getting to edw- post HD weight 82.8 3/27, 83.4 3/30, 80.8 4/02 79.7 4/03+++max goals have been 2 L on HD and has been dialyzing 4 x a week at times due to volume. - Also noted  Was a pre HD of 3.3 on 3/30 - while on a 2 K bath   Assessment/Plan: 1. SOB/volume overload with HCAP  - on empiric Vanc and Zosyn; low BP may limit fluid removal; plans to sycle enzymes - initial troponin elevated at 0.56 - ? Demand ischemia - no acute change on adm EKG. 2. ESRD -  MWF - max goals of 2 L during tmts; usually requiring 4 x per week HD due to volume; last HD 4/03; K 4.5  - plan serial HD to lower volume gently - orders written for Monday 3. Hypertension/volume  - needs decrease volume - set goal for 2.5 - today - likely will  need more UF on Monday - plan for repeat treatment - one time dose of midodrine 10 mg per Dr. Allena Katz.; BPs lower than usual outpt pre HD BPs of 170 - 180s.- needs strict fluid restriction - d/c on 5 midodrine last admission; metoprolol 12.5 bid 4. Anemia  - Has been stable in 11s on 5800 Epo - Hgb 11.7 today with excess volume - hold on initiating Aranesp - follow trends 5. Metabolic bone disease -  Hectorol has been on hold due to hypercalcemia and on 2 Ca bath; Ca much  higher here at 9.6  - sevelmer 2 ac - last outpt P was low - recheck with am pre HD labs - to direct dosing.  6. Nutrition - needs renal carb mod diet 7. Hx of recurrent cardioresp arrests, severe ASCVD with left ICA stenosis/severe pul HTN and Advanced CAD -  noninterventional or surgical candidate; pt not interested in palliative care - full code - though extremely fragile. 8. Hx GIB - on no heparin HD - Hgb stable 9. DM - per primary  Sheffield Slider, PA-C [facility] [phone] 12/12/2013, 10:24 AM

## 2013-12-12 NOTE — ED Notes (Signed)
Patient placed on 2L via [place].

## 2013-12-12 NOTE — ED Provider Notes (Signed)
CSN: [id number]     Arrival date & time 12/12/13  0530 History   First MD Initiated Contact with Patient 12/12/13 [phone]     Chief Complaint  Patient presents with  . Fall     (Consider location/radiation/quality/duration/timing/severity/associated sxs/prior Treatment) HPI Comments: Patients with history of multiple medical problems including end-stage renal disease on dialysis (M./W./F.), 2 episodes of cardiac arrest during dialysis, CVA, CAD -- presents with complaint of generalized weakness. Patient is accompanied by her daughter who gives most of the history. Patient was transferring from her wheelchair this morning when she had a fall onto her lower back and buttocks. Patient did not lose consciousness. She currently does not complain of pain from the fall. Patient's daughter assisted and noted bright red blood in the stool. Current complaint is generalized weakness. Patient has had a cough for the past 5 days. No documented fever. No other URI symptoms. No skin rashes. No nausea, vomiting, or diarrhea. Patient makes very little urine. No chest pain or shortness of breath. Other than generalized weakness, patient acting at baseline per daughter. The onset of this condition was gradual. The course is constant. Aggravating factors: none. Alleviating factors: none.    Patient is a 68 y.o. female presenting with fall. The history is provided by the patient, medical records and a relative.  Fall Associated symptoms include coughing and weakness. Pertinent negatives include no abdominal pain, chest pain, fever, headaches, myalgias, nausea, rash, sore throat or vomiting.    Past Medical History  Diagnosis Date  . PAD (peripheral artery disease)     a. L BKA  . Type II diabetes mellitus   . Anemia   . History of blood transfusion 2002    "related to amputation" (08/13/2013)  . End stage renal disease on dialysis     "[facility]; M, W, F" (08/13/2013)  . DVT (deep venous thrombosis)      a. R IJ DVT 08/2013.  Marland Kitchen CVA (cerebral infarction)     a. MRI 08/2013: possible left motor strip infarct with remote pontine infarct.  . Pulmonary HTN     a. Cath 2011 - mod to severe pulmonary hypertension. b. Echo 08/2013: PAP (mild-mod increased).  . Thrombocytopenia   . CAD (coronary artery disease)     a. Severe diffuse diabetic disease by cath 2011, for med rx, not a candidate for CABG or PCI at that time.  . Aortic stenosis     mild  . Syncope     a. 2011, etiology never clear.  . Carotid artery disease     a. Dopp 07/9146: 1-39% LICA< 60-79% RICA borderline >80%.  . Cerebrovascular disease     a. 08/2013 MRA brain: multiple bilateral moderate and high grade intracranial stenoses  . Cardiac arrest 08/14/13    a. Possible arrest in setting of hypotension during dialysis with possible arrest requiring brief CPR, not intubated and was fully intact after episode. Suspected due to low cerebral perfusion in setting of hypotension. Unclear if any arrhythmia.  . End stage renal disease on dialysis   . Peripheral arterial occlusive disease   . Hypertension   . Diabetes mellitus type 2, uncontrolled, with complications   . DVT (deep venous thrombosis)      Right internal jugular vein  . CAD (coronary artery disease), native coronary artery 2011    Diffuse distal vessel CAD noted at cardiac catheterization  . MI (myocardial infarction)   . Stroke    Past Surgical History  Procedure Laterality Date  . Av fistula placement Left 2011    upper arm  . Below knee leg amputation Left 2002  . Tonsillectomy    . Cataract extraction w/ intraocular lens  implant, bilateral Bilateral 2012-2014  . Esophagogastroduodenoscopy N/A 08/30/2013    Procedure: ESOPHAGOGASTRODUODENOSCOPY (EGD);  Surgeon: Theda Belfast, MD;  Location: [facility] ENDOSCOPY;  Service: Endoscopy;  Laterality: N/A;  . Below knee leg amputation Left   . Av fistula placement Left    Family History  Problem Relation Age of  Onset  . Hypertension Mother   . Hypertension Father   . Heart attack Brother   . Stroke Mother   . Pulmonary embolism Father    History  Substance Use Topics  . Smoking status: Never Smoker   . Smokeless tobacco: Not on file  . Alcohol Use: No   OB History   Grav Para Term Preterm Abortions TAB SAB Ect Mult Living                 Review of Systems  Constitutional: Negative for fever.  HENT: Negative for rhinorrhea and sore throat.   Eyes: Negative for redness.  Respiratory: Positive for cough.   Cardiovascular: Negative for chest pain.  Gastrointestinal: Positive for blood in stool (bright red). Negative for nausea, vomiting, abdominal pain and diarrhea.  Genitourinary: Negative for dysuria.  Musculoskeletal: Negative for myalgias.  Skin: Negative for rash.  Neurological: Positive for weakness. Negative for headaches.      Allergies  Phenergan and Vitamin d analogs  Home Medications   Current Outpatient Rx  Name  Route  Sig  Dispense  Refill  . b complex-vitamin c-folic acid (NEPHRO-VITE) 0.8 MG TABS tablet   Oral   Take 1 tablet by mouth daily.         . famotidine (PEPCID) 20 MG tablet   Oral   Take 1 tablet (20 mg total) by mouth at bedtime.   30 tablet   1   . glimepiride (AMARYL) 1 MG tablet   Oral   Take 1 tablet (1 mg total) by mouth daily with breakfast.   30 tablet   1   . midodrine (PROAMATINE) 5 MG tablet   Oral   Take 1 tablet (5 mg total) by mouth every Monday, Wednesday, and Friday with hemodialysis.   30 tablet   1   . Nutritional Supplements (FEEDING SUPPLEMENT, NEPRO CARB STEADY,) LIQD   Oral   Take 237 mLs by mouth daily.         . pantoprazole (PROTONIX) 40 MG tablet   Oral   Take 1 tablet (40 mg total) by mouth 2 (two) times daily.   60 tablet   1   . sevelamer carbonate (RENVELA) 800 MG tablet   Oral   Take 1,600 mg by mouth 3 (three) times daily with meals.           BP 140/54  Pulse 87  Temp(Src) 98.1 F  (36.7 C) (Oral)  Resp 30  SpO2 89%  Physical Exam  Nursing note and vitals reviewed. Constitutional: She is oriented to person, place, and time. She appears well-developed and well-nourished.  HENT:  Head: Normocephalic and atraumatic.  Nose: Nose normal.  Mouth/Throat: Oropharynx is clear and moist.  Eyes: Conjunctivae are normal. Right eye exhibits no discharge. Left eye exhibits no discharge.  Conjunctivae pale  Neck: Normal range of motion. Neck supple.  Cardiovascular: Normal rate, regular rhythm and normal heart sounds.   No  murmur heard. Pulmonary/Chest: Effort normal. No accessory muscle usage. No respiratory distress. She has no decreased breath sounds. She has no wheezes. She has rhonchi (Scattered). She has rales.  Abdominal: Soft. There is no tenderness. There is no rebound and no guarding.  Musculoskeletal: She exhibits edema (right lower extremity edema 1-2+ to the knee) and tenderness (Generalized lower extremities).  Left lower extremity amputation  Neurological: She is alert and oriented to person, place, and time. She exhibits normal muscle tone.  Skin: Skin is warm and dry. No rash noted.  Psychiatric: She has a normal mood and affect.    ED Course  Procedures (including critical care time) Labs Review Labs Reviewed  CBC WITH DIFFERENTIAL - Abnormal; Notable for the following:    WBC 13.1 (*)    RDW 16.8 (*)    Neutrophils Relative % 89 (*)    Lymphocytes Relative 7 (*)    Neutro Abs 11.7 (*)    All other components within normal limits  COMPREHENSIVE METABOLIC PANEL - Abnormal; Notable for the following:    Sodium 133 (*)    Chloride 88 (*)    Glucose, Bld 149 (*)    BUN 48 (*)    Creatinine, Ser 6.66 (*)    Albumin 2.9 (*)    Alkaline Phosphatase 129 (*)    GFR calc non Af Amer 6 (*)    GFR calc Af Amer 7 (*)    All other components within normal limits  POC OCCULT BLOOD, ED - Abnormal; Notable for the following:    Fecal Occult Bld POSITIVE (*)     All other components within normal limits  I-STAT TROPOININ, ED - Abnormal; Notable for the following:    Troponin i, poc 0.56 (*)    All other components within normal limits  OCCULT BLOOD X 1 CARD TO LAB, STOOL   Imaging Review Dg Chest 2 View  12/12/2013   CLINICAL DATA:  Cough  EXAM: CHEST  2 VIEW  COMPARISON:  11/19/2013  FINDINGS: Cardiac shadow remains enlarged. Patchy infiltrative changes are noted throughout both lungs but most prominent in the right middle and lower lobes. No acute bony abnormality is seen.  IMPRESSION: Diffuse bilateral patchy infiltrates. Followup films are recommended following appropriate therapy.   Electronically Signed   By: Alcide CleverMark  Lukens M.D.   On: 12/12/2013 07:12     EKG Interpretation None      6:17 AM Patient seen and examined. Work-up initiated. Medications ordered.   Vital signs reviewed and are as follows: Filed Vitals:   12/12/13 0558  BP: 140/54  Pulse: 87  Temp: 98.1 F (36.7 C)  Resp: 30   7:35 AM CXR reviewed. D/w and seen by Dr. Hyacinth MeekerMiller. Will treat/admit for HCAP.    Date: 12/12/2013  Rate: 83  Rhythm: normal sinus rhythm  QRS Axis: left  Intervals: normal  ST/T Wave abnormalities: nonspecific ST/T changes  Conduction Disutrbances:left bundle branch block  Narrative Interpretation:   Old EKG Reviewed: changes noted, lateral t-wave inversions compared to previous  8:47 AM Consulted [facility] who will see.   Troponin returned after mildly elevated. Upon further review, patient has severe 3 vessel disease on recent cath last month. She is not candidate for CABG. Palliative care recommended at that time.   Will call nephrology and make them aware. Will update Dr. Sunnie Nielsenegalado regarding troponin.   8:50 AM Spoke with Dr. Eliott Nineunham regarding need for consult/dialysis.   9:07 AM Spoke with Dr. Shirlee LatchMcLean. States consider heparin over  night, cycle enzymes. Otherwise as patient is not candidate for revascularization, if everything is stable, f/u  in office.    MDM   Final diagnoses:  HCAP (healthcare-associated pneumonia)  Hypoxia  ESRD (end stage renal disease)   Admit for above.    Renne Crigler, PA-C 12/12/13 0854  Renne Crigler, PA-C 12/12/13 [phone]

## 2013-12-12 NOTE — Consult Note (Signed)
I have personally seen and examined this patient and agree with the assessment/plan as outlined [facility] by [facility]. Carson Meche K.,MD 12/12/2013 1:17 PM

## 2013-12-12 NOTE — ED Notes (Signed)
Old and New EKG given to Dr Miller 

## 2013-12-12 NOTE — Consult Note (Signed)
I have personally seen and examined this patient and agree with the assessment/plan as outlined above by [facility]. Kamara Allan K.,MD 12/12/2013 1:16 PM

## 2013-12-12 NOTE — Procedures (Signed)
Patient seen on Hemodialysis. QB 400, UF goal 2L (Currently off while she is undergoing EKG for evaluation of possible ST elevation noted on rhythm monitor) Treatment adjusted as needed.  Briana BillsJay Shakai Dolley MD [facility]. Office # [phone] Pager # [phone] 1:07 PM

## 2013-12-13 ENCOUNTER — Inpatient Hospital Stay (HOSPITAL_COMMUNITY): Payer: Medicare Other

## 2013-12-13 DIAGNOSIS — J189 Pneumonia, unspecified organism: Principal | ICD-10-CM

## 2013-12-13 DIAGNOSIS — N186 End stage renal disease: Secondary | ICD-10-CM

## 2013-12-13 DIAGNOSIS — R5381 Other malaise: Secondary | ICD-10-CM

## 2013-12-13 DIAGNOSIS — I2510 Atherosclerotic heart disease of native coronary artery without angina pectoris: Secondary | ICD-10-CM

## 2013-12-13 DIAGNOSIS — R5383 Other fatigue: Secondary | ICD-10-CM

## 2013-12-13 LAB — TROPONIN I
Troponin I: 1.08 ng/mL (ref <0.30)
Troponin I: 0.86 ng/mL (ref <0.30)

## 2013-12-13 LAB — GLUCOSE, CAPILLARY
Glucose-Capillary: 100 mg/dL — ABNORMAL HIGH (ref 70–99)
Glucose-Capillary: 107 mg/dL — ABNORMAL HIGH (ref 70–99)

## 2013-12-13 LAB — CBC
HCT: 31.9 % — ABNORMAL LOW (ref 36.0–46.0)
Hemoglobin: 10.7 g/dL — ABNORMAL LOW (ref 12.0–15.0)
MCH: 29.3 pg (ref 26.0–34.0)
MCHC: 33.5 g/dL (ref 30.0–36.0)
MCV: 87.4 fL (ref 78.0–100.0)
Platelets: 147 10*3/uL — ABNORMAL LOW (ref 150–400)
RBC: 3.65 MIL/uL — ABNORMAL LOW (ref 3.87–5.11)
RDW: 16.8 % — AB (ref 11.5–15.5)
WBC: 11 10*3/uL — ABNORMAL HIGH (ref 4.0–10.5)

## 2013-12-13 LAB — BASIC METABOLIC PANEL
BUN: 32 mg/dL — ABNORMAL HIGH (ref 6–23)
CO2: 26 mEq/L (ref 19–32)
CREATININE: 4.37 mg/dL — AB (ref 0.50–1.10)
Calcium: 9 mg/dL (ref 8.4–10.5)
Chloride: 98 mEq/L (ref 96–112)
GFR, EST AFRICAN AMERICAN: 11 mL/min — AB (ref >90)
GFR, EST NON AFRICAN AMERICAN: 10 mL/min — AB (ref >90)
Glucose, Bld: 76 mg/dL (ref 70–99)
Potassium: 4.2 mEq/L (ref 3.7–5.3)
SODIUM: 140 meq/L (ref 137–147)

## 2013-12-13 MED ORDER — WHITE PETROLATUM GEL
Status: AC
  Filled 2013-12-13: qty 5

## 2013-12-13 MED ORDER — MIDODRINE HCL 5 MG PO TABS
5.0000 mg | ORAL_TABLET | ORAL | Status: DC
  Filled 2013-12-13: qty 1

## 2013-12-13 MED ORDER — WHITE PETROLATUM GEL
Status: DC | PRN
Start: 2013-12-13 — End: 2013-12-18
  Administered 2013-12-13: 0.2 via TOPICAL
  Filled 2013-12-13: qty 5

## 2013-12-13 NOTE — ED Provider Notes (Signed)
Medical screening examination/treatment/procedure(s) were conducted as a shared visit with non-physician practitioner(s) and myself.  I personally evaluated the patient during the encounter  Please see my separate respective documentation pertaining to this patient encounter   Vida RollerBrian D Ajai Terhaar, MD 12/13/13 [phone]

## 2013-12-13 NOTE — Consult Note (Signed)
General: chronically ill Neck: JVP 8-9 cm, no thyromegaly or thyroid nodule.  Lungs: Crackles at bases bilaterally. CV: Nondisplaced PMI.  Heart regular S1/S2, no S3/S4, 1/6 SEM RUSB.  1+ right ankle edema. Abdomen: Soft, nontender, no hepatosplenomegaly, no distention.  Skin: Intact without lesions or rashes.  Neurologic: Mildly sedated post-HD Psych: Depressed affect Extremities: s/p left BKA.  HEENT: Normal.   Assessment/Plan: Patient seen with PA, agree with the above note.   68 yo with extensive PMH including ESRD, s/p L BKA, Diastolic CHF, unrevascularizable severe CAD, and prior cardiac arrests (PEA) presented to the ER after a fall at home.  She was noted to have PNA. She had no chest pain.  Cardiac enzymes were sent and troponin was 1 so cardiology was consulted.   The patient has severe multivessel coronary artery disease. She unfortunately has advanced comorbid medical conditions including end-stage renal disease and peripheral arterial disease with history of amputation. She is not a candidate for PCI because of diffuse severe disease and inability to take antiplatelet medications in the setting of severe recurrent anemia. I do not think that there are any treatment options for her heart disease. Even medical therapy is fraught with problems as she experiences hypotension during dialysis. She has been taken off Imdur as she really does not experience any angina and she does not take this medicine during dialysis days when she has hypotension. She has had symptomatic bradycardia in the past and she is not on a beta blocker.  She cannot take Ranexa given ESRD.  The elevated troponin this admission may represent demand ischemia, perhaps from volume overload.  This is being treated via HD.   I would not check any more cardiac enzymes.  No further cardiac workup is going to be helpful here.  Would treat her PNA and dialyze her, pulling off fluid as tolerated.  Palliative care involvement  would be appropriate.  We will follow at a distance.    Marca AnconaDalton Laressa Bolinger 12/13/2013 3:57 PM

## 2013-12-13 NOTE — Progress Notes (Signed)
Subjective: No complaints. Only 1.3 kg off w HD yesterday. Off of NRB mask, on 2L [place] now.   Filed Vitals:   12/13/13 0400 12/13/13 0525 12/13/13 0811 12/13/13 0907  BP: 151/59  133/55 123/55  Pulse: 74  80   Temp: 97.9 F (36.6 C)  98.8 F (37.1 C) 97.6 F (36.4 C)  TempSrc: Oral  Oral Oral  Resp: 16  19   Height:      Weight:  79.4 kg (175 lb 0.7 oz)  80.3 kg (177 lb 0.5 oz)  SpO2: 96%  100%    Exam: Alert, on HD , no distress +JVD Mild bilat rhonchi RRR no MRG Abd soft, NTND, no ascites or hsm L BKA no edema, RLE 1-2+ edema pretibial Neuro is nf, ox3  CXR 4/5 bilat patchy diffuse infiltrates  Dialysis: MWF [facility] (sometimes 4x/wk as schedule allows) 4h  Dry wt unclear- was 78 v 80kg   Max UF 2kg has been goal   BFR has been 250   2K bath   Heparin none  Buttonhole lower part AVF or sharp needle top section Hect none   EPO 5800  Fe none   Assessment: 1 Cough / dyspnea / bilat pulm infiltrates- also LG temp, ^WBC, pna and/or fluid overload 2 ESRD on hemodialysis 3 Hx of recurrent cardioresp arrests / severe ASCVD with left ICA stenosis / severe pul HTN / severe 3V CAD - not interventional or surgical candidate; pt not interested in palliative care 4 Anemia Hb 10.7 , hold epo for now 5 MBD- cont binders, not vit D d/t high Ca, check phos 6 Hx GIB 7 DM 8 Hx CVA 8 PAD L BKA   Plan- HD again today, UF 2-2.5kg as BP will tolerate.  BP dropped mid HD already. Breathing better after HD yesterday, but not a lot of fluid removed, also on abx. Will request repeat CXR today after HD. Not sure yet how much of this presentation is volume vs infection.    Vinson Moselleob Xyon Lukasik MD  pager [phone]    cell [phone]  12/13/2013, 9:14 AM     Recent Labs Lab 12/12/13 0635 12/13/13 0314  NA 133* 140  K 4.5 4.2  CL 88* 98  CO2 23 26  GLUCOSE 149* 76  BUN 48* 32*  CREATININE 6.66* 4.37*  CALCIUM 9.6 9.0    Recent Labs Lab 12/12/13 0635  AST 30  ALT 35  ALKPHOS 129*   BILITOT 1.2  PROT 6.9  ALBUMIN 2.9*    Recent Labs Lab 12/12/13 0635 12/13/13 0314  WBC 13.1* 11.0*  NEUTROABS 11.7*  --   HGB 12.1 10.7*  HCT 36.1 31.9*  MCV 88.3 87.4  PLT 190 147*   . famotidine  20 mg Oral QHS  . feeding supplement (NEPRO CARB STEADY)  237 mL Oral Daily  . guaiFENesin  600 mg Oral BID  . heparin  5,000 Units Subcutaneous 3 times per day  . [START ON 12/15/2013] midodrine  5 mg Oral Q M,W,F-HD  . multivitamin  1 tablet Oral QHS  . pantoprazole  40 mg Oral BID WC  . piperacillin-tazobactam (ZOSYN)  IV  2.25 g Intravenous 3 times per day  . sevelamer carbonate  1,600 mg Oral TID WC  . sodium chloride  3 mL Intravenous Q12H  . vancomycin  750 mg Intravenous Q M,W,F-HD     sodium chloride, sodium chloride, sodium chloride, acetaminophen, acetaminophen, albuterol, feeding supplement (NEPRO CARB STEADY), heparin, lidocaine (PF), lidocaine-prilocaine, ondansetron (ZOFRAN)  IV, ondansetron, pentafluoroprop-tetrafluoroeth, sodium chloride

## 2013-12-13 NOTE — Progress Notes (Signed)
[facility] PROGRESS NOTE  Briana Gould NWG:[id number] DOB: 26-Sep-1945 DOA: 12/12/2013 PCP: [name], MD  Assessment/Plan: 1-Acute respiratory Failure; in setting of PNA, and Heart Failure. Oxygen supplementation, IV antibiotics. Lactic acid 2.8 Now off NB mask. Now on 2 L of Oxygen. For dialysis again today.   2-Health Care Associated PNA; Patient presents with leukocytosis, infiltrates chest x ray. Continue with vancomycin and Zosyn day 2.  blood culture; no growth to date. sputum culture.   3-Encephalopathy; more alert today. Likely secondary to hypoxemia and infectious process.   4-Elevated troponin; probably demand ischemia. Setting PNA, renal failure. Cardiology consulted. Patient no PCI candidate due to high risk for bleed. Cycle cardiac enzyme.   5-ESRD;continue with dialysis.   6-Fall; denies head trauma. Denies joint pain.   Code Status: Full Code.  Family Communication: care discussed with patient.  Disposition Plan: Remain step down.    Consultants:  Renal.   Procedures:  Dialysis.   Antibiotics:  Zosyn 4-5  Vancomycin 4-5  HPI/Subjective: Still coughing a lot. Dyspnea some what improved.  She wants to sit up.   Objective: Filed Vitals:   12/13/13 0811  BP: 133/55  Pulse: 80  Temp: 98.8 F (37.1 C)  Resp: 19    Intake/Output Summary (Last 24 hours) at 12/13/13 0857 Last data filed at 12/13/13 0559  Gross per 24 hour  Intake    323 ml  Output   1315 ml  Net   -992 ml   Filed Weights   12/12/13 0719 12/12/13 1730 12/13/13 0525  Weight: 82.101 kg (181 lb) 79.5 kg (175 lb 4.3 oz) 79.4 kg (175 lb 0.7 oz)    Exam:   General: Alert, less lethargic. Oriented to place and person.   Cardiovascular: S 1, S 2 RRR  Respiratory: bilateral ronchus, no wheezes.  Abdomen: BS present, soft, NT  Musculoskeletal: left BKA  Data Reviewed: Basic Metabolic Panel:  Recent Labs Lab 12/12/13 0635 12/13/13 0314  NA 133* 140  K  4.5 4.2  CL 88* 98  CO2 23 26  GLUCOSE 149* 76  BUN 48* 32*  CREATININE 6.66* 4.37*  CALCIUM 9.6 9.0   Liver Function Tests:  Recent Labs Lab 12/12/13 0635  AST 30  ALT 35  ALKPHOS 129*  BILITOT 1.2  PROT 6.9  ALBUMIN 2.9*   No results found for this basename: LIPASE, AMYLASE,  in the last 168 hours No results found for this basename: AMMONIA,  in the last 168 hours CBC:  Recent Labs Lab 12/12/13 0635 12/13/13 0314  WBC 13.1* 11.0*  NEUTROABS 11.7*  --   HGB 12.1 10.7*  HCT 36.1 31.9*  MCV 88.3 87.4  PLT 190 147*   Cardiac Enzymes: No results found for this basename: CKTOTAL, CKMB, CKMBINDEX, TROPONINI,  in the last 168 hours BNP (last 3 results)  Recent Labs  09/20/13 0913 09/20/13 1155  PROBNP 57457.0* 47172.0*   CBG: No results found for this basename: GLUCAP,  in the last 168 hours  Recent Results (from the past 240 hour(s))  CULTURE, BLOOD (ROUTINE X 2)     Status: None   Collection Time    12/12/13 10:00 AM      Result Value Ref Range Status   Specimen Description BLOOD RIGHT FOREARM   Final   Special Requests BOTTLES DRAWN AEROBIC AND ANAEROBIC 10CC   Final   Culture  Setup Time     Final   Value: 12/12/2013 18:35     Performed at [facility]   Culture     Final   Value:        BLOOD CULTURE RECEIVED NO GROWTH TO DATE CULTURE WILL BE HELD FOR 5 DAYS BEFORE ISSUING A FINAL NEGATIVE REPORT     Performed at [facility]   Report Status PENDING   Incomplete  CULTURE, BLOOD (ROUTINE X 2)     Status: None   Collection Time    12/12/13 10:04 AM      Result Value Ref Range Status   Specimen Description BLOOD RIGHT HAND   Final   Special Requests BOTTLES DRAWN AEROBIC AND ANAEROBIC 10CC   Final   Culture  Setup Time     Final   Value: 12/12/2013 18:35     Performed at [facility]   Culture     Final   Value:        BLOOD CULTURE RECEIVED NO GROWTH TO DATE CULTURE WILL BE HELD FOR 5 DAYS BEFORE ISSUING A FINAL NEGATIVE  REPORT     Performed at [facility]   Report Status PENDING   Incomplete  MRSA PCR SCREENING     Status: None   Collection Time    12/12/13  5:35 PM      Result Value Ref Range Status   MRSA by PCR NEGATIVE  NEGATIVE Final   Comment:            The GeneXpert MRSA Assay (FDA     approved for NASAL specimens     only), is one component of a     comprehensive MRSA colonization     surveillance program. It is not     intended to diagnose MRSA     infection nor to guide or     monitor treatment for     MRSA infections.     Studies: Dg Chest 2 View  12/12/2013   CLINICAL DATA:  Cough  EXAM: CHEST  2 VIEW  COMPARISON:  11/19/2013  FINDINGS: Cardiac shadow remains enlarged. Patchy infiltrative changes are noted throughout both lungs but most prominent in the right middle and lower lobes. No acute bony abnormality is seen.  IMPRESSION: Diffuse bilateral patchy infiltrates. Followup films are recommended following appropriate therapy.   Electronically Signed   By: Alcide CleverMark  Lukens M.D.   On: 12/12/2013 07:12    Scheduled Meds: . famotidine  20 mg Oral QHS  . feeding supplement (NEPRO CARB STEADY)  237 mL Oral Daily  . guaiFENesin  600 mg Oral BID  . heparin  5,000 Units Subcutaneous 3 times per day  . midodrine  5 mg Oral Q M,W,F-HD  . multivitamin  1 tablet Oral QHS  . pantoprazole  40 mg Oral BID WC  . piperacillin-tazobactam (ZOSYN)  IV  2.25 g Intravenous 3 times per day  . sevelamer carbonate  1,600 mg Oral TID WC  . sodium chloride  3 mL Intravenous Q12H  . vancomycin  750 mg Intravenous Q M,W,F-HD   Continuous Infusions:   Active Problems:   HCAP (healthcare-associated pneumonia)   PNA (pneumonia)    Time spent: 35 minutes.     Hartley Barefootegalado, Kindal Ponti A  [facility] Pager [phone]. If 7PM-7AM, please contact night-coverage at [email or web address], password [email or web address] 12/13/2013, 8:57 AM  LOS: 1 day

## 2013-12-13 NOTE — Consult Note (Signed)
Reason for Consult: Elevated Troponin  Requesting Physician: Mary Sella  HPI:   68 year old woman presenting for followup evaluation. She has a very complex medical history. She's had multiple recent hospital admissions with cardiac arrest during hemodialysis, she was hospitalized Jan- Feb- March this year. She underwent cardiac catheterization March 16 demonstrating a right atrial pressure of 20, PA pressure 75/29 with a mean of 47, preserved cardiac output of 8.4 L per minute, and severe multivessel coronary artery disease with total occlusion of the RCA and 80% or greater stenosis in the LAD and left circumflex and multiple areas of those vessels. Her LVEF was felt to be mildly to moderately depressed and estimated at 40-45% with severe hypokinesis of the inferior wall. Given her severe comorbidities and history of major bleeding on antiplatelet an anti-coagulant drugs, PCI was not considered an option. She was discharged on medical Rx but was unable to take beta blocker secondary to bradycardia. Her B/P is low and she is not actually having angina so nitrates didn't appear to be indicated.                The pt presented to the ER 12/12/13 with weakness during a transfer from her W/C. Pt has had a cough x 5 days. Evaluation in the ED showed a WBC at 13K with 89% PMNs significantly up from last admission. CXR showed diffuse bilateral patchy infiltrates. Pt became more SOB in the ED and ABG showed pH 7.49, Po2 44 pCO2 36 O2 sats 84%. Her BNP was > 5k. She was admitted and Rx'd for respiratory failure secondary to CHF and HCAP. Troponin's were ordered and came back positive- 1.08- and we are asked to see her in consult. She denies chest pain.                     PMHx:  Past Medical History  Diagnosis Date  . PAD (peripheral artery disease)     a. L BKA  . Type II diabetes mellitus   . Anemia   . History of blood transfusion 2002    "related to amputation" (08/13/2013)  . End stage renal disease  on dialysis     "[facility]; M, W, F" (08/13/2013)  . DVT (deep venous thrombosis)     a. R IJ DVT 08/2013.  Marland Kitchen CVA (cerebral infarction)     a. MRI 08/2013: possible left motor strip infarct with remote pontine infarct.  . Pulmonary HTN     a. Cath 2011 - mod to severe pulmonary hypertension. b. Echo 08/2013: PAP 1mHg (mild-mod increased).  . Thrombocytopenia   . CAD (coronary artery disease)     a. Severe diffuse diabetic disease by cath 2011, for med rx, not a candidate for CABG or PCI at that time.  . Aortic stenosis     mild  . Syncope     a. 2011, etiology never clear.  . Carotid artery disease     a. Dopp 122/6333 15-45%LICA< 662-56%RICA borderline >80%.  . Cerebrovascular disease     a. 08/2013 MRA brain: multiple bilateral moderate and high grade intracranial stenoses  . Cardiac arrest 08/14/13    a. Possible arrest in setting of hypotension during dialysis with possible arrest requiring brief CPR, not intubated and was fully intact after episode. Suspected due to low cerebral perfusion in setting of hypotension. Unclear if any arrhythmia.  . End stage renal disease on dialysis   . Peripheral arterial occlusive disease   .  Hypertension   . Diabetes mellitus type 2, uncontrolled, with complications   . DVT (deep venous thrombosis)      Right internal jugular vein  . CAD (coronary artery disease), native coronary artery 2011    Diffuse distal vessel CAD noted at cardiac catheterization  . MI (myocardial infarction)   . Stroke    Past Surgical History  Procedure Laterality Date  . Av fistula placement Left 2011    upper arm  . Below knee leg amputation Left 2002  . Tonsillectomy    . Cataract extraction w/ intraocular lens  implant, bilateral Bilateral 2012-2014  . Esophagogastroduodenoscopy N/A 08/30/2013    Procedure: ESOPHAGOGASTRODUODENOSCOPY (EGD);  Surgeon: Beryle Beams, MD;  Location: [facility] ENDOSCOPY;  Service: Endoscopy;  Laterality: N/A;  . Below  knee leg amputation Left   . Av fistula placement Left     FAMHx: HTN, brother had CAD   SOCHx:  reports that she has never smoked. She does not have any smokeless tobacco history on file. She reports that she does not drink alcohol or use illicit drugs.  ALLERGIES: Allergies  Allergen Reactions  . Phenergan [Promethazine Hcl] Other (See Comments)    Causes patient to" vomit more"  . Vitamin D Analogs Rash    ROS: Pertinent items are noted in HPI. see H&P for complete detail, no anginal chest pain  HOME MEDICATIONS: Prescriptions prior to admission  Medication Sig Dispense Refill  . b complex-vitamin c-folic acid (NEPHRO-VITE) 0.8 MG TABS tablet Take 1 tablet by mouth daily.      . famotidine (PEPCID) 20 MG tablet Take 1 tablet (20 mg total) by mouth at bedtime.  30 tablet  1  . glimepiride (AMARYL) 1 MG tablet Take 1 tablet (1 mg total) by mouth daily with breakfast.  30 tablet  1  . midodrine (PROAMATINE) 5 MG tablet Take 1 tablet (5 mg total) by mouth every Monday, Wednesday, and Friday with hemodialysis.  30 tablet  1  . Nutritional Supplements (FEEDING SUPPLEMENT, NEPRO CARB STEADY,) LIQD Take 237 mLs by mouth daily.      . pantoprazole (PROTONIX) 40 MG tablet Take 1 tablet (40 mg total) by mouth 2 (two) times daily.  60 tablet  1  . sevelamer carbonate (RENVELA) 800 MG tablet Take 1,600 mg by mouth 3 (three) times daily with meals.         HOSPITAL MEDICATIONS: I have reviewed the patient's current medications.  VITALS: Blood pressure 119/60, pulse 77, temperature 98.1 F (36.7 C), temperature source Oral, resp. rate 15, height _0  (1.676 m), weight 174 lb 2.6 oz (79 kg), SpO2 98.00%.  PHYSICAL EXAM: General appearance: alert, cooperative and no distress Neck: Lt CA bruit Lungs: basilar rales Heart: regular rate and rhythm and soft systolic mumur Abdomen: non tender, not distended Extremities: Lt BKA Pulses: diminnished Skin: cool and dry Neurologic: Grossly  normal  LABS: Results for orders placed during the hospital encounter of 12/12/13 (from the past 48 hour(s))  CBC WITH DIFFERENTIAL     Status: Abnormal   Collection Time    12/12/13  6:35 AM      Result Value Ref Range   WBC 13.1 (*) 4.0 - 10.5 K/uL   RBC 4.09  3.87 - 5.11 MIL/uL   Hemoglobin 12.1  12.0 - 15.0 g/dL   HCT 36.1  36.0 - 46.0 %   MCV 88.3  78.0 - 100.0 fL   MCH 29.6  26.0 - 34.0 pg   MCHC 33.5  30.0 - 36.0 g/dL   RDW 16.8 (*) 11.5 - 15.5 %   Platelets 190  150 - 400 K/uL   Neutrophils Relative % 89 (*) 43 - 77 %   Lymphocytes Relative 7 (*) 12 - 46 %   Monocytes Relative 4  3 - 12 %   Eosinophils Relative 0  0 - 5 %   Basophils Relative 0  0 - 1 %   Neutro Abs 11.7 (*) 1.7 - 7.7 K/uL   Lymphs Abs 0.9  0.7 - 4.0 K/uL   Monocytes Absolute 0.5  0.1 - 1.0 K/uL   Eosinophils Absolute 0.0  0.0 - 0.7 K/uL   Basophils Absolute 0.0  0.0 - 0.1 K/uL   WBC Morphology INCREASED BANDS (>20% BANDS)     Comment: TOXIC GRANULATION  COMPREHENSIVE METABOLIC PANEL     Status: Abnormal   Collection Time    12/12/13  6:35 AM      Result Value Ref Range   Sodium 133 (*) 137 - 147 mEq/L   Potassium 4.5  3.7 - 5.3 mEq/L   Chloride 88 (*) 96 - 112 mEq/L   CO2 23  19 - 32 mEq/L   Glucose, Bld 149 (*) 70 - 99 mg/dL   BUN 48 (*) 6 - 23 mg/dL   Creatinine, Ser 6.66 (*) 0.50 - 1.10 mg/dL   Calcium 9.6  8.4 - 10.5 mg/dL   Total Protein 6.9  6.0 - 8.3 g/dL   Albumin 2.9 (*) 3.5 - 5.2 g/dL   AST 30  0 - 37 U/L   ALT 35  0 - 35 U/L   Alkaline Phosphatase 129 (*) 39 - 117 U/L   Total Bilirubin 1.2  0.3 - 1.2 mg/dL   GFR calc non Af Amer 6 (*) >90 mL/min   GFR calc Af Amer 7 (*) >90 mL/min   Comment: (NOTE)     The eGFR has been calculated using the CKD EPI equation.     This calculation has not been validated in all clinical situations.     eGFR's persistently <90 mL/min signify possible Chronic Kidney     Disease.  POC OCCULT BLOOD, ED     Status: Abnormal   Collection Time     12/12/13  7:04 AM      Result Value Ref Range   Fecal Occult Bld POSITIVE (*) NEGATIVE  I-STAT TROPOININ, ED     Status: Abnormal   Collection Time    12/12/13  8:18 AM      Result Value Ref Range   Troponin i, poc 0.56 (*) 0.00 - 0.08 ng/mL   Comment NOTIFIED PHYSICIAN     Comment 3            Comment: Due to the release kinetics of cTnI,     a negative result within the first hours     of the onset of symptoms does not rule out     myocardial infarction with certainty.     If myocardial infarction is still suspected,     repeat the test at appropriate intervals.  LACTIC ACID, PLASMA     Status: Abnormal   Collection Time    12/12/13  9:23 AM      Result Value Ref Range   Lactic Acid, Venous 2.6 (*) 0.5 - 2.2 mmol/L  CULTURE, BLOOD (ROUTINE X 2)     Status: None   Collection Time    12/12/13 10:00 AM  Result Value Ref Range   Specimen Description BLOOD RIGHT FOREARM     Special Requests BOTTLES DRAWN AEROBIC AND ANAEROBIC 10CC     Culture  Setup Time       Value: 12/12/2013 18:35     Performed at [facility]   Culture       Value:        BLOOD CULTURE RECEIVED NO GROWTH TO DATE CULTURE WILL BE HELD FOR 5 DAYS BEFORE ISSUING A FINAL NEGATIVE REPORT     Performed at [facility]   Report Status PENDING    CULTURE, BLOOD (ROUTINE X 2)     Status: None   Collection Time    12/12/13 10:04 AM      Result Value Ref Range   Specimen Description BLOOD RIGHT HAND     Special Requests BOTTLES DRAWN AEROBIC AND ANAEROBIC 10CC     Culture  Setup Time       Value: 12/12/2013 18:35     Performed at [facility]   Culture       Value:        BLOOD CULTURE RECEIVED NO GROWTH TO DATE CULTURE WILL BE HELD FOR 5 DAYS BEFORE ISSUING A FINAL NEGATIVE REPORT     Performed at [facility]   Report Status PENDING    I-STAT ARTERIAL BLOOD GAS, ED     Status: Abnormal   Collection Time    12/12/13 10:28 AM      Result Value Ref Range   pH, Arterial 7.492  (*) 7.350 - 7.450   pCO2 arterial 35.8  35.0 - 45.0 mmHg   pO2, Arterial 44.0 (*) 80.0 - 100.0 mmHg   Bicarbonate 27.4 (*) 20.0 - 24.0 mEq/L   TCO2 29  0 - 100 mmol/L   O2 Saturation 84.0     Acid-Base Excess 4.0 (*) 0.0 - 2.0 mmol/L   Collection site RADIAL, ALLEN'S TEST ACCEPTABLE     Drawn by RT     Sample type ARTERIAL    MRSA PCR SCREENING     Status: None   Collection Time    12/12/13  5:35 PM      Result Value Ref Range   MRSA by PCR NEGATIVE  NEGATIVE   Comment:            The GeneXpert MRSA Assay (FDA     approved for NASAL specimens     only), is one component of a     comprehensive MRSA colonization     surveillance program. It is not     intended to diagnose MRSA     infection nor to guide or     monitor treatment for     MRSA infections.  CBC     Status: Abnormal   Collection Time    12/13/13  3:14 AM      Result Value Ref Range   WBC 11.0 (*) 4.0 - 10.5 K/uL   RBC 3.65 (*) 3.87 - 5.11 MIL/uL   Hemoglobin 10.7 (*) 12.0 - 15.0 g/dL   HCT 31.9 (*) 36.0 - 46.0 %   MCV 87.4  78.0 - 100.0 fL   MCH 29.3  26.0 - 34.0 pg   MCHC 33.5  30.0 - 36.0 g/dL   RDW 16.8 (*) 11.5 - 15.5 %   Platelets 147 (*) 150 - 400 K/uL  BASIC METABOLIC PANEL     Status: Abnormal   Collection Time    12/13/13  3:14 AM      Result Value Ref Range   Sodium 140  137 - 147 mEq/L   Comment: DELTA CHECK NOTED   Potassium 4.2  3.7 - 5.3 mEq/L   Chloride 98  96 - 112 mEq/L   CO2 26  19 - 32 mEq/L   Glucose, Bld 76  70 - 99 mg/dL   BUN 32 (*) 6 - 23 mg/dL   Creatinine, Ser 4.37 (*) 0.50 - 1.10 mg/dL   Comment: DELTA CHECK NOTED   Calcium 9.0  8.4 - 10.5 mg/dL   GFR calc non Af Amer 10 (*) >90 mL/min   GFR calc Af Amer 11 (*) >90 mL/min   Comment: (NOTE)     The eGFR has been calculated using the CKD EPI equation.     This calculation has not been validated in all clinical situations.     eGFR's persistently <90 mL/min signify possible Chronic Kidney     Disease.  TROPONIN I     Status:  Abnormal   Collection Time    12/13/13 12:00 PM      Result Value Ref Range   Troponin I 1.08 (*) <0.30 ng/mL   Comment:            Due to the release kinetics of cTnI,     a negative result within the first hours     of the onset of symptoms does not rule out     myocardial infarction with certainty.     If myocardial infarction is still suspected,     repeat the test at appropriate intervals.     CRITICAL RESULT CALLED TO, READ BACK BY AND VERIFIED WITH:     T.SHELTON,RN 12/13/13 1404 BY BSLADE    EKG: NSR, PVCs, LBBB  IMAGING: Dg Chest 2 View  12/12/2013   CLINICAL DATA:  Cough  EXAM: CHEST  2 VIEW  COMPARISON:  11/19/2013  FINDINGS: Cardiac shadow remains enlarged. Patchy infiltrative changes are noted throughout both lungs but most prominent in the right middle and lower lobes. No acute bony abnormality is seen.  IMPRESSION: Diffuse bilateral patchy infiltrates. Followup films are recommended following appropriate therapy.   Electronically Signed   By: Inez Catalina M.D.   On: 12/12/2013 07:12    IMPRESSION: Principal Problem:   Acute respiratory failure with hypoxia Active Problems:   CAD- severe 3V CAD at cath March 2015- medical Rx   Cardiac arrest x 3- Jan, Feb, March 2015   HCAP (healthcare-associated pneumonia)   Diabetes mellitus with end stage renal disease   ESRD (end stage renal disease) on dialysis   GIB (gastrointestinal bleeding)--recurrent- not a candidate for Plavix   HYPERLIPIDEMIA   PAD (peripheral artery disease)   Anemia of chronic renal failure   Bradycardia- intol to beta blocker   Paroxysmal a-fib   PULMONARY HYPERTENSION   BKA, LEFT LEG   RECOMMENDATION: MD to see. Continue medical Rx.   Time Spent Directly with Patient: 40 minutes  Erlene Quan [phone] beeper 12/13/2013, 3:41 PM   See separate note with MD recommendations.   Loralie Champagne 12/13/2013 4:48 PM

## 2013-12-14 ENCOUNTER — Ambulatory Visit (HOSPITAL_COMMUNITY): Admission: RE | Admit: 2013-12-14 | Payer: Medicare Other | Source: Ambulatory Visit

## 2013-12-14 DIAGNOSIS — D631 Anemia in chronic kidney disease: Secondary | ICD-10-CM

## 2013-12-14 DIAGNOSIS — N189 Chronic kidney disease, unspecified: Secondary | ICD-10-CM

## 2013-12-14 DIAGNOSIS — N039 Chronic nephritic syndrome with unspecified morphologic changes: Secondary | ICD-10-CM

## 2013-12-14 LAB — CBC
HEMATOCRIT: 32.3 % — AB (ref 36.0–46.0)
Hemoglobin: 11 g/dL — ABNORMAL LOW (ref 12.0–15.0)
MCH: 29.6 pg (ref 26.0–34.0)
MCHC: 34.1 g/dL (ref 30.0–36.0)
MCV: 87.1 fL (ref 78.0–100.0)
Platelets: 138 10*3/uL — ABNORMAL LOW (ref 150–400)
RBC: 3.71 MIL/uL — ABNORMAL LOW (ref 3.87–5.11)
RDW: 16.8 % — ABNORMAL HIGH (ref 11.5–15.5)
WBC: 11.4 10*3/uL — ABNORMAL HIGH (ref 4.0–10.5)

## 2013-12-14 LAB — BASIC METABOLIC PANEL
BUN: 24 mg/dL — ABNORMAL HIGH (ref 6–23)
CALCIUM: 9 mg/dL (ref 8.4–10.5)
CHLORIDE: 97 meq/L (ref 96–112)
CO2: 26 mEq/L (ref 19–32)
CREATININE: 3.18 mg/dL — AB (ref 0.50–1.10)
GFR calc non Af Amer: 14 mL/min — ABNORMAL LOW (ref >90)
GFR, EST AFRICAN AMERICAN: 16 mL/min — AB (ref >90)
Glucose, Bld: 115 mg/dL — ABNORMAL HIGH (ref 70–99)
Potassium: 3.9 mEq/L (ref 3.7–5.3)
Sodium: 138 mEq/L (ref 137–147)

## 2013-12-14 LAB — GLUCOSE, CAPILLARY
GLUCOSE-CAPILLARY: 213 mg/dL — AB (ref 70–99)
Glucose-Capillary: 171 mg/dL — ABNORMAL HIGH (ref 70–99)

## 2013-12-14 LAB — TROPONIN I: Troponin I: 0.88 ng/mL (ref <0.30)

## 2013-12-14 MED ORDER — MIDODRINE HCL 5 MG PO TABS
10.0000 mg | ORAL_TABLET | Freq: Two times a day (BID) | ORAL | Status: DC
  Administered 2013-12-14 – 2013-12-16 (×4): 10 mg via ORAL
  Filled 2013-12-14 (×6): qty 2

## 2013-12-14 NOTE — Progress Notes (Addendum)
[facility] PROGRESS NOTE  Briana Gould ZOX:[id number] DOB: Briana Gould, Briana Gould DOA: 12/12/2013 PCP: Laurena SlimmerLARK,PRESTON S, MD  Assessment/Plan: 1-Acute respiratory Failure; in setting of PNA, and Heart Failure. Oxygen supplementation, IV antibiotics. Lactic acid 2.8 Now off NB mask. Now on 2 L of Oxygen. Had dialysis 4-5 and 4-6. Chest X ray 4-6: Persistent patchy pulmonary infiltrates could represent edema or  infection, little changed. Improved.   2-Health Care Associated PNA; Patient presents with leukocytosis, infiltrates chest x ray. Continue with vancomycin and Zosyn day 3.  blood culture: no growth to date. sputum culture not send yet.   3-Encephalopathy; . Likely secondary to hypoxemia and infectious process. Alert, awake, slow respond time. Oriented to place and person.   4-Elevated troponin; probably demand ischemia. Setting PNA, renal failure. Cardiology consulted. Patient no PCI candidate due to high risk for bleed. No further therapy.   5-ESRD;continue with dialysis.   6-Fall; denies head trauma. Denies joint pain.   Code Status: Full Code.  Family Communication: care discussed with patient.  Disposition Plan: transfer to telemetry.   Consultants:  Renal.  Cardiology   Procedures:  Dialysis.   Antibiotics:  Zosyn 4-5  Vancomycin 4-5  HPI/Subjective: Cough improved. She is more al;ert. Dyspnea improved.   Objective: Filed Vitals:   12/14/13 0700  BP:   Pulse: 72  Temp:   Resp: 24    Intake/Output Summary (Last 24 hours) at 12/14/13 0751 Last data filed at 12/14/13 0526  Gross per 24 hour  Intake    650 ml  Output   1277 ml  Net   -627 ml   Filed Weights   12/13/13 0907 12/13/13 1330 12/14/13 0351  Weight: 80.3 kg (177 lb 0.5 oz) 79 kg (174 lb 2.6 oz) 80.015 kg (176 lb 6.4 oz)    Exam:   General: Alert, sitting bed. Oriented to place and person.   Cardiovascular: S 1, S 2 RRR  Respiratory: no wheezing, less ronchus.  Abdomen: BS  present, soft, NT  Musculoskeletal: left BKA  Data Reviewed: Basic Metabolic Panel:  Recent Labs Lab 12/12/13 0635 12/13/13 0314 12/14/13 0112  NA 133* 140 138  K 4.5 4.2 3.9  CL 88* 98 97  CO2 23 26 26   GLUCOSE 149* 76 115*  BUN 48* 32* 24*  CREATININE 6.66* 4.37* 3.18*  CALCIUM 9.6 9.0 9.0   Liver Function Tests:  Recent Labs Lab 12/12/13 0635  AST 30  ALT 35  ALKPHOS 129*  BILITOT 1.2  PROT 6.9  ALBUMIN 2.9*   No results found for this basename: LIPASE, AMYLASE,  in the last 168 hours No results found for this basename: AMMONIA,  in the last 168 hours CBC:  Recent Labs Lab 12/12/13 0635 12/13/13 0314 12/14/13 0112  WBC 13.1* 11.0* 11.4*  NEUTROABS 11.7*  --   --   HGB 12.1 10.7* 11.0*  HCT 36.1 31.9* 32.3*  MCV 88.3 87.4 87.1  PLT 190 147* 138*   Cardiac Enzymes:  Recent Labs Lab 12/13/13 1200 12/13/13 1925 12/14/13 0112  TROPONINI 1.08* 0.86* 0.88*   BNP (last 3 results)  Recent Labs  09/20/13 0913 09/20/13 1155  PROBNP 57457.0* 47172.0*   CBG:  Recent Labs Lab 12/13/13 0818 12/13/13 2212  GLUCAP 100* 107*    Recent Results (from the past 240 hour(s))  CULTURE, BLOOD (ROUTINE X 2)     Status: None   Collection Time    12/12/13 10:00 AM      Result Value Ref Range Status  Specimen Description BLOOD RIGHT FOREARM   Final   Special Requests BOTTLES DRAWN AEROBIC AND ANAEROBIC 10CC   Final   Culture  Setup Time     Final   Value: 12/12/2013 18:35     Performed at [facility]   Culture     Final   Value:        BLOOD CULTURE RECEIVED NO GROWTH TO DATE CULTURE WILL BE HELD FOR 5 DAYS BEFORE ISSUING A FINAL NEGATIVE REPORT     Performed at [facility]   Report Status PENDING   Incomplete  CULTURE, BLOOD (ROUTINE X 2)     Status: None   Collection Time    12/12/13 10:04 AM      Result Value Ref Range Status   Specimen Description BLOOD RIGHT HAND   Final   Special Requests BOTTLES DRAWN AEROBIC AND ANAEROBIC  10CC   Final   Culture  Setup Time     Final   Value: 12/12/2013 18:35     Performed at [facility]   Culture     Final   Value:        BLOOD CULTURE RECEIVED NO GROWTH TO DATE CULTURE WILL BE HELD FOR 5 DAYS BEFORE ISSUING A FINAL NEGATIVE REPORT     Performed at [facility]   Report Status PENDING   Incomplete  MRSA PCR SCREENING     Status: None   Collection Time    12/12/13  5:35 PM      Result Value Ref Range Status   MRSA by PCR NEGATIVE  NEGATIVE Final   Comment:            The GeneXpert MRSA Assay (FDA     approved for NASAL specimens     only), is one component of a     comprehensive MRSA colonization     surveillance program. It is not     intended to diagnose MRSA     infection nor to guide or     monitor treatment for     MRSA infections.     Studies: Dg Chest 2 View  12/13/2013   CLINICAL DATA:  Followup pulmonary infiltrates edema versus pneumonia, history end-stage renal disease on dialysis, diabetes, peripheral vascular disease, coronary artery disease, hypertension, pulmonary hypertension, stroke  EXAM: CHEST  2 VIEW  COMPARISON:  12/12/2013  FINDINGS: Enlargement of cardiac silhouette.  Slight pulmonary vascular congestion.  Patchy pulmonary infiltrates little changed.  Blunting of left posterior costophrenic angle on lateral view by small effusion.  No pneumothorax.  Osseous structures stable.  Slight rotation to the right on the PA view.  IMPRESSION: Persistent patchy pulmonary infiltrates could represent edema or infection, little changed.   Electronically Signed   By: Ulyses Southward M.D.   On: 12/13/2013 19:00    Scheduled Meds: . famotidine  20 mg Oral QHS  . feeding supplement (NEPRO CARB STEADY)  237 mL Oral Daily  . guaiFENesin  600 mg Oral BID  . heparin  5,000 Units Subcutaneous 3 times per day  . [START ON 12/15/2013] midodrine  5 mg Oral Q M,W,F-HD  . multivitamin  1 tablet Oral QHS  . pantoprazole  40 mg Oral BID WC  .  piperacillin-tazobactam (ZOSYN)  IV  2.25 g Intravenous 3 times per day  . sevelamer carbonate  1,600 mg Oral TID WC  . sodium chloride  3 mL Intravenous Q12H  . 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     Hartley Barefoot A  [facility] Pager [phone]. If 7PM-7AM, please contact night-coverage at [email or web address], password [email or web address] 12/14/2013, 7:51 AM  LOS: 2 days

## 2013-12-14 NOTE — Care Management Note (Signed)
    Page 1 of 1   12/14/2013     11:34:59 AM   CARE MANAGEMENT NOTE 12/14/2013  Patient:  Briana Gould,Briana Gould   Account Number:  [id number]  Date Initiated:  12/13/2013  Documentation initiated by:  Junius CreamerWELL,DEBBIE  Subjective/Objective Assessment:   adm w pneumonia     Action/Plan:   lives w husband, pcp dr Melissa Montaneprestn clark   Anticipated DC Date:     Anticipated DC Plan:  HOME W HOME HEALTH SERVICES      DC Planning Services  CM consult      [facility]  Resumption Of Svcs/PTA Provider   Choice offered to / List presented to:          [facility]  [facility]      [facility]  [facility]   Status of service:   Medicare Important Message given?   (If response is "NO", the following Medicare IM given date fields will be blank) Date Medicare IM given:   Date Additional Medicare IM given:    Discharge Disposition:  HOME W HOME HEALTH SERVICES  Per UR Regulation:  Reviewed for med. necessity/level of care/duration of stay  If discussed at Long Length of Stay Meetings, dates discussed:    Comments:  4/7 1134a debbie Bayan Hedstrom rn,bsn alerted [facility] of adm. they will follow along.

## 2013-12-14 NOTE — Progress Notes (Signed)
Subjective: "I feel lousy".  CXR post HD yest with no change. BP dropped into 60's yest with only 1.2 kg off. Coughing, no SOB.   Filed Vitals:   12/14/13 0500 12/14/13 0600 12/14/13 0700 12/14/13 0827  BP:      Pulse: 92 73 72   Temp:    98.2 F (36.8 C)  TempSrc:    Oral  Resp: 18 24 24    Height:      Weight:      SpO2: 100% 99% 98% 96%   Exam: Alert, on HD , no distress +mild JVD Occ basilar coarse rales bilat RRR no MRG Abd soft, NTND, no ascites or hsm 1+ pitting edema bilat LE's, including L BK stump Neuro is nf, ox3  CXR 4/5 bilat patchy diffuse infiltrates CXR 4/6 after HD no chg in bilat infiltrates  Dialysis: MWF [facility] (sometimes 4x/wk as schedule allows) 4h  Dry wt unclear- was 78 v 80kg   Max UF 2kg has been goal   BFR has been 250   2K bath   Heparin none  Buttonhole lower part AVF or sharp needle top section Hect none   EPO 5800  Fe none   Assessment: 1 Cough / lowgrade fever / bilat pulm infiltrates- BP's dropping, unable to remove much volume, favor infectious cause of cough/cxr infiltrates 2 ESRD on hemodialysis 3 Hx of recurrent cardioresp arrests / severe ASCVD with left ICA stenosis / severe pul HTN / severe 3V CAD (100% RCA, multiple >80% lesions of LAD and LCx) - not interventional or surgical candidate 4 Anemia Hb 10.7 , hold epo for now 5 MBD- cont binders, not vit D d/t high Ca, check phos 6 HTN/volume- on midodrine pre HD, +LE edema, is close to dry wt at 80kg 6 Hx GIB on APA"s 7 DM 2 8 Hx CVA 8 PAD L BKA   Plan- Will increase midodrine and plan HD in am, UF as BP tolerates    Vinson Moselle MD  pager [phone]    cell [phone]  12/14/2013, 10:21 AM     Recent Labs Lab 12/12/13 0635 12/13/13 0314 12/14/13 0112  NA 133* 140 138  K 4.5 4.2 3.9  CL 88* 98 97  CO2 23 26 26   GLUCOSE 149* 76 115*  BUN 48* 32* 24*  CREATININE 6.66* 4.37* 3.18*  CALCIUM 9.6 9.0 9.0    Recent Labs Lab 12/12/13 0635  AST 30  ALT 35  ALKPHOS  129*  BILITOT 1.2  PROT 6.9  ALBUMIN 2.9*    Recent Labs Lab 12/12/13 0635 12/13/13 0314 12/14/13 0112  WBC 13.1* 11.0* 11.4*  NEUTROABS 11.7*  --   --   HGB 12.1 10.7* 11.0*  HCT 36.1 31.9* 32.3*  MCV 88.3 87.4 87.1  PLT 190 147* 138*   . famotidine  20 mg Oral QHS  . feeding supplement (NEPRO CARB STEADY)  237 mL Oral Daily  . guaiFENesin  600 mg Oral BID  . heparin  5,000 Units Subcutaneous 3 times per day  . [START ON 12/15/2013] midodrine  5 mg Oral Q M,W,F-HD  . multivitamin  1 tablet Oral QHS  . pantoprazole  40 mg Oral BID WC  . piperacillin-tazobactam (ZOSYN)  IV  2.25 g Intravenous 3 times per day  . sevelamer carbonate  1,600 mg Oral TID WC  . sodium chloride  3 mL Intravenous Q12H  . vancomycin  750 mg Intravenous Q M,W,F-HD     sodium chloride, sodium chloride, sodium chloride,  acetaminophen, acetaminophen, albuterol, feeding supplement (NEPRO CARB STEADY), heparin, lidocaine (PF), lidocaine-prilocaine, ondansetron (ZOFRAN) IV, ondansetron, pentafluoroprop-tetrafluoroeth, sodium chloride, white petrolatum

## 2013-12-14 NOTE — Progress Notes (Signed)
[name], female   DOB: 02/09/1946, 68 y.o.   MRN: [id number]    Pt was scheduled to see Dr Corliss Skainseveshwar as an OP today to discuss Abnormal arteriogram performed 09/30/2013 Revealed: L ICA 95%stenosis                  R MCA 50% stenosis                  R ICA 65% stenosis Pt sxs: transient L sided weakness  Was hospitalized 2 days ago with hypotension; cough; weakness Treatment now for HCAP  Hx ESRD; DM; PAD; CAD; anemia; HLD; HTN  Pt knows Dr Corliss Skainseveshwar is aware of her hospitalization We will follow her chart Need to reschedule appt with Dr Georgetta Habereveshwar----possible see pt as IP

## 2013-12-14 NOTE — Progress Notes (Signed)
[facility]  Patient Status: Active (receiving services up to time of hospitalization)  [facility] is providing the following services: RN  If patient discharges after hours, please call [phone].   Kizzie FurnishDonna Fellmy 12/14/2013, 11:26 AM

## 2013-12-14 NOTE — Progress Notes (Signed)
PT Cancellation Note  Patient Details Name: Briana Gould Wixted MRN: [id number] DOB: 06/23/1946   Cancelled Treatment:    Reason Eval/Treat Not Completed: Fatigue/lethargy limiting ability to participate.  Pt seated EOB.  Looks and reports being weak and fatigued.  She does not feel up for OOB with PT right now and reports her prosthesis is coming later today or tomorrow.  PT will attempt eval tomorrow.   Thanks,    Rollene Rotundaebecca B. Shedric Fredericks, PT, DPT [phone]   12/14/2013, 2:00 PM

## 2013-12-15 ENCOUNTER — Inpatient Hospital Stay (HOSPITAL_COMMUNITY): Payer: Medicare Other

## 2013-12-15 ENCOUNTER — Encounter (HOSPITAL_COMMUNITY): Payer: Self-pay

## 2013-12-15 DIAGNOSIS — E1129 Type 2 diabetes mellitus with other diabetic kidney complication: Secondary | ICD-10-CM

## 2013-12-15 LAB — BLOOD GAS, ARTERIAL
Acid-Base Excess: 5.1 mmol/L — ABNORMAL HIGH (ref 0.0–2.0)
Bicarbonate: 28.5 mEq/L — ABNORMAL HIGH (ref 20.0–24.0)
DRAWN BY: 205171
O2 CONTENT: 2 L/min
O2 SAT: 98.5 %
PCO2 ART: 37.9 mmHg (ref 35.0–45.0)
Patient temperature: 98.6
TCO2: 29.7 mmol/L (ref 0–100)
pH, Arterial: 7.488 — ABNORMAL HIGH (ref 7.350–7.450)
pO2, Arterial: 119 mmHg — ABNORMAL HIGH (ref 80.0–100.0)

## 2013-12-15 LAB — GLUCOSE, CAPILLARY
GLUCOSE-CAPILLARY: 182 mg/dL — AB (ref 70–99)
GLUCOSE-CAPILLARY: 181 mg/dL — AB (ref 70–99)
Glucose-Capillary: 179 mg/dL — ABNORMAL HIGH (ref 70–99)

## 2013-12-15 LAB — CBC
HCT: 31.6 % — ABNORMAL LOW (ref 36.0–46.0)
Hemoglobin: 10.9 g/dL — ABNORMAL LOW (ref 12.0–15.0)
MCH: 29.6 pg (ref 26.0–34.0)
MCHC: 34.5 g/dL (ref 30.0–36.0)
MCV: 85.9 fL (ref 78.0–100.0)
PLATELETS: 152 10*3/uL (ref 150–400)
RBC: 3.68 MIL/uL — ABNORMAL LOW (ref 3.87–5.11)
RDW: 16.8 % — ABNORMAL HIGH (ref 11.5–15.5)
WBC: 8.7 10*3/uL (ref 4.0–10.5)

## 2013-12-15 LAB — VANCOMYCIN, RANDOM: Vancomycin Rm: 11.6 ug/mL

## 2013-12-15 MED ORDER — NEPRO/CARBSTEADY PO LIQD: 237.0000 mL | ORAL | Status: DC | PRN

## 2013-12-15 MED ORDER — ALTEPLASE 2 MG IJ SOLR
2.0000 mg | Freq: Once | INTRAMUSCULAR | Status: AC | PRN
  Filled 2013-12-15: qty 2

## 2013-12-15 MED ORDER — VANCOMYCIN HCL IN DEXTROSE 750-5 MG/150ML-% IV SOLN
750.0000 mg | INTRAVENOUS | Status: DC
  Filled 2013-12-15: qty 150

## 2013-12-15 MED ORDER — SODIUM CHLORIDE 0.9 % IV SOLN: 100.0000 mL | INTRAVENOUS | Status: DC | PRN

## 2013-12-15 MED ORDER — LIDOCAINE-PRILOCAINE 2.5-2.5 % EX CREA: 1.0000 "application " | TOPICAL_CREAM | CUTANEOUS | Status: DC | PRN

## 2013-12-15 MED ORDER — VANCOMYCIN HCL 1000 MG IV SOLR
1250.0000 mg | INTRAVENOUS | Status: AC
  Administered 2013-12-15: 1250 mg via INTRAVENOUS
  Filled 2013-12-15: qty 1250

## 2013-12-15 MED ORDER — PENTAFLUOROPROP-TETRAFLUOROETH EX AERO: 1.0000 "application " | INHALATION_SPRAY | CUTANEOUS | Status: DC | PRN

## 2013-12-15 MED ORDER — LIDOCAINE HCL (PF) 1 % IJ SOLN: 5.0000 mL | INTRAMUSCULAR | Status: DC | PRN

## 2013-12-15 MED ORDER — HEPARIN SODIUM (PORCINE) 1000 UNIT/ML DIALYSIS
1000.0000 [IU] | INTRAMUSCULAR | Status: DC | PRN
  Filled 2013-12-15: qty 1

## 2013-12-15 NOTE — Progress Notes (Addendum)
[facility] PROGRESS NOTE  Briana Gould [id number] DOB: [birth year] DOA: 12/12/2013 PCP: Laurena SlimmerLARK,PRESTON S, MD  Assessment/Plan: Briana Gould is a 68 y.o. female with significant PMH Diabetes, CAD, ESRD, Carotid stenosis, multiple admission for Cardiac Arrest who presents after a fall. Patient relates that she felt weak, and fell down. Denies loss of consciousness. She didn't hit her head. She denies joint pain. She also relates worsening cough for last week, productive. Denies fever. She denies chest pain. She has been more sleepy per daughter. Patient was hypoxic, re required NB mask. She underwent emergent dialysis on admission, which improved oxygen requirement.   1-Acute respiratory Failure: In setting of PNA, and Heart Failure. Oxygen supplementation, IV antibiotics. Lactic acid 2.8 on admission.  Now off NB mask.  Had dialysis 4-5 and 4-6, 4-8. Chest X ray 4-6: Persistent patchy pulmonary infiltrates could represent edema or infection, little changed. -Patient with SOB. Will check Stat Chest x ray, ABG. -Patient feeling better after she was started on 2 L of oxygen.   2-Health Care Associated PNA; Patient presents with leukocytosis, infiltrates chest x ray. Continue with vancomycin and Zosyn day 4.  blood culture: no growth to date. sputum culture not send yet.   3-Encephalopathy; . Likely secondary to hypoxemia and infectious process. Alert, awake, slow respond time. Oriented to place and person. Still with decrease respond time. She denies headaches. I will check CT head.   4-Elevated troponin; probably demand ischemia. Setting PNA, renal failure. Cardiology consulted. Patient no PCI candidate due to high risk for bleed. No further therapy.   5-ESRD;continue with dialysis.   6-Fall; denies head trauma. Denies joint pain.   Code Status: Full Code.  Family Communication: care discussed with patient.  Disposition Plan: to be determine.     Consultants:  Renal.  Cardiology   Procedures:  Dialysis.   Antibiotics:  Zosyn 4-5  Vancomycin 4-5  HPI/Subjective: Cough improved.  She is alert, still with slow respond time.  I was called by nurse, patient was complaining of dyspnea.  Patient oxygen saturation 96 on room air, bilateral air movement, no respiratory muscle used. She was started on oxygen for comfort. Patient feeling better.   Objective: Filed Vitals:   12/15/13 1146  BP: 150/73  Pulse: 73  Temp: 98.1 F (36.7 C)  Resp: 26    Intake/Output Summary (Last 24 hours) at 12/15/13 1241 Last data filed at 12/15/13 1146  Gross per 24 hour  Intake    770 ml  Output   2003 ml  Net  -1233 ml   Filed Weights   12/15/13 0642 12/15/13 0746 12/15/13 1146  Weight: 79.1 kg (174 lb 6.1 oz) 79.6 kg (175 lb 7.8 oz) 77.6 kg (171 lb 1.2 oz)    Exam:   General: Alert, following command, slow respond.   Cardiovascular: S 1, S 2 RRR  Respiratory: bilateral air movement, bilateral ronchu  Abdomen: BS present, soft, NT  Musculoskeletal: left BKA  Data Reviewed: Basic Metabolic Panel:  Recent Labs Lab 12/12/13 0635 12/13/13 0314 12/14/13 0112  NA 133* 140 138  K 4.5 4.2 3.9  CL 88* 98 97  CO2 23 26 26   GLUCOSE 149* 76 115*  BUN 48* 32* 24*  CREATININE 6.66* 4.37* 3.18*  CALCIUM 9.6 9.0 9.0   Liver Function Tests:  Recent Labs Lab 12/12/13 0635  AST 30  ALT 35  ALKPHOS 129*  BILITOT 1.2  PROT 6.9  ALBUMIN 2.9*   No results found for this basename:  LIPASE, AMYLASE,  in the last 168 hours No results found for this basename: AMMONIA,  in the last 168 hours CBC:  Recent Labs Lab 12/12/13 0635 12/13/13 0314 12/14/13 0112  WBC 13.1* 11.0* 11.4*  NEUTROABS 11.7*  --   --   HGB 12.1 10.7* 11.0*  HCT 36.1 31.9* 32.3*  MCV 88.3 87.4 87.1  PLT 190 147* 138*   Cardiac Enzymes:  Recent Labs Lab 12/13/13 1200 12/13/13 1925 12/14/13 0112  TROPONINI 1.08* 0.86* 0.88*   BNP  (last 3 results)  Recent Labs  09/20/13 0913 09/20/13 1155  PROBNP 57457.0* 47172.0*   CBG:  Recent Labs Lab 12/13/13 0818 12/13/13 2212 12/14/13 1614 12/14/13 2142 12/15/13 0610  GLUCAP 100* 107* 171* 213* 182*    Recent Results (from the past 240 hour(s))  CULTURE, BLOOD (ROUTINE X 2)     Status: None   Collection Time    12/12/13 10:00 AM      Result Value Ref Range Status   Specimen Description BLOOD RIGHT FOREARM   Final   Special Requests BOTTLES DRAWN AEROBIC AND ANAEROBIC 10CC   Final   Culture  Setup Time     Final   Value: 12/12/2013 18:35     Performed at [facility]   Culture     Final   Value:        BLOOD CULTURE RECEIVED NO GROWTH TO DATE CULTURE WILL BE HELD FOR 5 DAYS BEFORE ISSUING A FINAL NEGATIVE REPORT     Performed at [facility]   Report Status PENDING   Incomplete  CULTURE, BLOOD (ROUTINE X 2)     Status: None   Collection Time    12/12/13 10:04 AM      Result Value Ref Range Status   Specimen Description BLOOD RIGHT HAND   Final   Special Requests BOTTLES DRAWN AEROBIC AND ANAEROBIC 10CC   Final   Culture  Setup Time     Final   Value: 12/12/2013 18:35     Performed at [facility]   Culture     Final   Value:        BLOOD CULTURE RECEIVED NO GROWTH TO DATE CULTURE WILL BE HELD FOR 5 DAYS BEFORE ISSUING A FINAL NEGATIVE REPORT     Performed at [facility]   Report Status PENDING   Incomplete  MRSA PCR SCREENING     Status: None   Collection Time    12/12/13  5:35 PM      Result Value Ref Range Status   MRSA by PCR NEGATIVE  NEGATIVE Final   Comment:            The GeneXpert MRSA Assay (FDA     approved for NASAL specimens     only), is one component of a     comprehensive MRSA colonization     surveillance program. It is not     intended to diagnose MRSA     infection nor to guide or     monitor treatment for     MRSA infections.     Studies: Dg Chest 2 View  12/13/2013   CLINICAL DATA:   Followup pulmonary infiltrates edema versus pneumonia, history end-stage renal disease on dialysis, diabetes, peripheral vascular disease, coronary artery disease, hypertension, pulmonary hypertension, stroke  EXAM: CHEST  2 VIEW  COMPARISON:  12/12/2013  FINDINGS: Enlargement of cardiac silhouette.  Slight pulmonary vascular congestion.  Patchy pulmonary infiltrates little changed.  Blunting  of left posterior costophrenic angle on lateral view by small effusion.  No pneumothorax.  Osseous structures stable.  Slight rotation to the right on the PA view.  IMPRESSION: Persistent patchy pulmonary infiltrates could represent edema or infection, little changed.   Electronically Signed   By: Ulyses Southward M.D.   On: 12/13/2013 19:00    Scheduled Meds: . famotidine  20 mg Oral QHS  . feeding supplement (NEPRO CARB STEADY)  237 mL Oral Daily  . guaiFENesin  600 mg Oral BID  . heparin  5,000 Units Subcutaneous 3 times per day  . midodrine  10 mg Oral BID WC  . multivitamin  1 tablet Oral QHS  . pantoprazole  40 mg Oral BID WC  . piperacillin-tazobactam (ZOSYN)  IV  2.25 g Intravenous 3 times per day  . sevelamer carbonate  1,600 mg Oral TID WC  . sodium chloride  3 mL Intravenous Q12H  . 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     Rylea Selway A Levonne Carreras  [facility] Pager [phone]. If 7PM-7AM, please contact night-coverage at [email or web address], password [facility] 12/15/2013, 12:41 PM  LOS: 3 days

## 2013-12-15 NOTE — Progress Notes (Signed)
Subjective:  Seen on dialysis, comfortable, but continues to have cough  Objective: Vital signs in last 24 hours: Temp:  [98 F (36.7 C)-98.3 F (36.8 C)] 98 F (36.7 C) (04/08 0746) Pulse Rate:  [67-85] 73 (04/08 1030) Resp:  [16-30] 24 (04/08 1030) BP: (139-175)/(65-87) 153/72 mmHg (04/08 1030) SpO2:  [94 %-100 %] 98 % (04/08 0746) Weight:  [79.1 kg (174 lb 6.1 oz)-79.6 kg (175 lb 7.8 oz)] 79.6 kg (175 lb 7.8 oz) (04/08 0746) Weight change: -1.2 kg (-2 lb 10.3 oz)  Intake/Output from previous day: 04/07 0701 - 04/08 0700 In: 770 [P.O.:720; IV Piggyback:50] Out: 0  Intake/Output this shift: Total I/O In: 120 [P.O.:120] Out: 0   Lab Results:  Recent Labs  12/13/13 0314 12/14/13 0112  WBC 11.0* 11.4*  HGB 10.7* 11.0*  HCT 31.9* 32.3*  PLT 147* 138*   BMET:  Recent Labs  12/13/13 0314 12/14/13 0112  NA 140 138  K 4.2 3.9  CL 98 97  CO2 26 26  GLUCOSE 76 115*  BUN 32* 24*  CREATININE 4.37* 3.18*  CALCIUM 9.0 9.0   No results found for this basename: PTH,  in the last 72 hours Iron Studies: No results found for this basename: IRON, TIBC, TRANSFERRIN, FERRITIN,  in the last 72 hours  Studies/Results: No results found.  EXAM: General appearance:  Alert, in no apparent distress Resp: Bibasilar rales Cardio:  RRR without murmur or rub GI: + BS, soft and nontender Extremities:  L BKA, 1+ edema Access:  AVF @ LUA with BFR 400 cc/min  Dialysis: MWF [place] (sometimes 4x/wk as schedule allows)  4h Dry wt unclear- was 78 v 80kg Max UF 2kg has been goal BFR has been 250 2K bath Heparin none Buttonhole lower part AVF or sharp needle top section  Hect none EPO 5800 Fe none  Assessment/Plan: 1. Cough/low-grade fever / PNA - bilateral pulmonary infiltrates per CXR, on Vancomycin & Zosyn. 2. Hx recurrent cardiorespiratory arrests - severe ASCVD with L ICA stenosis, severe pulm HTN, severe 3 V CAD (100% RCA, multiple > 80% lesionsof LAD & LCx), not candidate for  intervention or surgery. 3. ESRD - HD on MWF @ [facility], K 3.9.  HD today. 4. HTN/Volume - BP 153/72, pre-HD wt 79.6 kg, unable to tolerate large UF goals, 2 L today.  5. Anemia - Hgb 11, stable. 6. Sec HPT - No Hectorol, Renvela 2 with meals.  7. Nutrition - carb-mod renal diet, multivitamin. 8. Hx GIB - no Heparin 9. DM - per primary.   LOS: 3 days   Gerome Apleyharles Lyles 12/15/2013,10:34 AM  Pt seen, examined, agree w assess/plan as above with additions as indicated. LG temps improving, cough improving, looks better today. HD today, lower dry wt gently if BP will tolerate.  Vinson Moselleob Ta Fair MD pager [phone]    cell [phone] 12/15/2013, 11:52 AM

## 2013-12-15 NOTE — Procedures (Signed)
I was present at this dialysis session, have reviewed the session itself and made  appropriate changes  Vinson Moselleob Jerzy Roepke MD (pgr) [phone]    [phone]) [phone] 12/15/2013, 11:54 AM

## 2013-12-15 NOTE — Progress Notes (Signed)
Pt called RN to room. Upon RN arrival, pt coughing violently, short of breath, states "I can't breathe". Pulse ox obtained, initially 85%, then improved to 95% when pt calmer, not moving, and not coughing. Pt tachypneic and somewhat lethargic, slow to answer questions, drifting off to sleep during talking. Lung field auscultation reveals extremely diminished BBS, little air movement noted. Other VS stable. Pt placed on 2L/[place]. Dr. Sunnie Nielsenegalado paged and notified, stated to call rapid response and order an ABG. ABG ordered, rapid response RN Angelique Blonderenise notified, Dr. Sunnie Nielsenegalado arrived at bedside to examine pt. Ordered chest x-ray and head CT. Will monitor closely.

## 2013-12-15 NOTE — Progress Notes (Signed)
ANTIBIOTIC CONSULT NOTE - FOLLOW UP  Pharmacy Consult for Vancomycin and Zosyn Indication: rule out pneumonia  Allergies  Allergen Reactions  . Phenergan [Promethazine Hcl] Other (See Comments)    Causes patient to" vomit more"  . Vitamin D Analogs Rash    Patient Measurements: Height: 5\' 6"  (167.6 cm) Weight: 175 lb 7.8 oz (79.6 kg) IBW/kg (Calculated) : 59.3  Vital Signs: Temp: 98 F (36.7 C) (04/08 0746) Temp src: Oral (04/08 0746) BP: 153/72 mmHg (04/08 1030) Pulse Rate: 73 (04/08 1030) Intake/Output from previous day: 04/07 0701 - 04/08 0700 In: 770 [P.O.:720; IV Piggyback:50] Out: 0  Intake/Output from this shift: Total I/O In: 120 [P.O.:120] Out: 0   Labs:  Recent Labs  12/13/13 0314 12/14/13 0112  WBC 11.0* 11.4*  HGB 10.7* 11.0*  PLT 147* 138*  CREATININE 4.37* 3.18*   Estimated Creatinine Clearance: 18 ml/min (by C-G formula based on Cr of 3.18).  Recent Labs  12/15/13 0705  [facility] 11.6     Microbiology: Recent Results (from the past 720 hour(s))  MRSA PCR SCREENING     Status: None   Collection Time    11/19/13  3:55 PM      Result Value Ref Range Status   MRSA by PCR NEGATIVE  NEGATIVE Final   Comment:            The GeneXpert MRSA Assay (FDA     approved for NASAL specimens     only), is one component of a     comprehensive MRSA colonization     surveillance program. It is not     intended to diagnose MRSA     infection nor to guide or     monitor treatment for     MRSA infections.  CULTURE, BLOOD (ROUTINE X 2)     Status: None   Collection Time    12/12/13 10:00 AM      Result Value Ref Range Status   Specimen Description BLOOD RIGHT FOREARM   Final   Special Requests BOTTLES DRAWN AEROBIC AND ANAEROBIC 10CC   Final   Culture  Setup Time     Final   Value: 12/12/2013 18:35     Performed at [facility]   Culture     Final   Value:        BLOOD CULTURE RECEIVED NO GROWTH TO DATE CULTURE WILL BE HELD FOR 5 DAYS  BEFORE ISSUING A FINAL NEGATIVE REPORT     Performed at [facility]   Report Status PENDING   Incomplete  CULTURE, BLOOD (ROUTINE X 2)     Status: None   Collection Time    12/12/13 10:04 AM      Result Value Ref Range Status   Specimen Description BLOOD RIGHT HAND   Final   Special Requests BOTTLES DRAWN AEROBIC AND ANAEROBIC 10CC   Final   Culture  Setup Time     Final   Value: 12/12/2013 18:35     Performed at [facility]   Culture     Final   Value:        BLOOD CULTURE RECEIVED NO GROWTH TO DATE CULTURE WILL BE HELD FOR 5 DAYS BEFORE ISSUING A FINAL NEGATIVE REPORT     Performed at [facility]   Report Status PENDING   Incomplete  MRSA PCR SCREENING     Status: None   Collection Time    12/12/13  5:35 PM  Result Value Ref Range Status   MRSA by PCR NEGATIVE  NEGATIVE Final   Comment:            The GeneXpert MRSA Assay (FDA     approved for NASAL specimens     only), is one component of a     comprehensive MRSA colonization     surveillance program. It is not     intended to diagnose MRSA     infection nor to guide or     monitor treatment for     MRSA infections.    Anti-infectives   Start     Dose/Rate Route Frequency Ordered Stop   12/13/13 1200  vancomycin (VANCOCIN) IVPB 750 mg/150 ml premix     750 mg 150 mL/hr over 60 Minutes Intravenous Every M-W-F (Hemodialysis) 12/12/13 0746     12/12/13 0800  vancomycin (VANCOCIN) 1,500 mg in sodium chloride 0.9 % 500 mL IVPB     1,500 mg 250 mL/hr over 120 Minutes Intravenous  Once 12/12/13 0746 12/12/13 1114   12/12/13 0800  piperacillin-tazobactam (ZOSYN) IVPB 2.25 g     2.25 g 100 mL/hr over 30 Minutes Intravenous 3 times per day 12/12/13 0746        Assessment: 68 yo F with ESRD presented to ED 4/5 with weakness and fall.  CXR in ED concerning for infiltrates and possible PNA.  Pt was initiated on Vancomycin and Zosyn.  It appears that a Vancomycin dose was missed 4/5 after HD and  thus a Vancomycin level was checked today to confirm.  Pre-HD level = 11.6.  Estimate post-HD level ~ 7.8.  Pt will need increased dose after HD today to reach goal concentrations.  Goal of Therapy:  pre-HD Vancomycin level 15-25 mcg/ml  Plan:  Vancomycin 1250 mg IV x 1 today after HD. Restart Vancomycin 750 mg IV with each HD session on Friday 4/10. Continue Zosyn 2.25 gm IV q8h. Follow up cx data, clinical progress, and antibiotic plans.  Toys 'R' UsKimberly Joaopedro Gould, Pharm.D., BCPS Clinical Pharmacist Pager [phone] 12/15/2013 10:43 AM

## 2013-12-15 NOTE — Evaluation (Signed)
Physical Therapy Evaluation Patient Details Name: Briana Gould MRN: [id number] DOB: 13-Apr-1946 Today's Date: 12/15/2013   History of Present Illness  68 y.o. female admitted to [facility] on 12/12/13 with SOB, weakness, and a fall.  Dx with acute respiratory failure, HCAP, acute encephalopathy, elevated triponons (likely due to demand ischemia), and fall (no head trauma or significant joint pain).  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  Clinical Impression  Limited evaluation completed as pt still does not have her prosthesis here today.  She is two person mod assist to get to standing without her prosthesis.  Breathing (no DOE) remained stable during session, but O2 via [place] left on due to episode earlier today.  PT is familiar with this pt from previous admission.  Husband is unable to provide physical assistance to the pt and unless one of her children is staying with them to help her physically 24/7, I am uncomfortable with sending her home.  She would be safer at SNF for rehab.   PT to follow acutely for deficits listed below.       Follow Up Recommendations SNF    Equipment Recommendations  None recommended by PT    Recommendations for Other Services OT consult     Precautions / Restrictions Precautions Precautions: Fall Precaution Comments: h/o falls and L BKA with prosthesis      Mobility  Bed Mobility Overal bed mobility: Needs Assistance Bed Mobility: Sit to Supine       Sit to supine: Mod assist   General bed mobility comments: mod assist to support trunk and bring legs up over the side of the bed.  Pt seemed to have difficulty processing how she would do this as she sat there with a very confused look on her face and needed repeated verbal and finally tactile cues.   Transfers Overall transfer level: Needs  assistance Equipment used: Rolling walker (2 wheeled) Transfers: Sit to/from Stand Sit to Stand: +2 physical assistance;Mod assist;From elevated surface         General transfer comment: Two person mod assist to transition to standing over just her right leg with RW.  Bed elevated to help with transitions (at baseline she has difficulty getting to standing).  She also did not have her prosthesis still today, so this transfer was harder without her left leg donned.    Ambulation/Gait             General Gait Details: unable due to lack of prosthesis.          Balance Overall balance assessment: Needs assistance;History of Falls Sitting-balance support: Feet unsupported;Bilateral upper extremity supported Sitting balance-Leahy Scale: Good Sitting balance - Comments: pt able to remove her upper extremity assist and donn sock over her residual limb in sitting.  She is very kyphotic,  so she compresses her lung sitting EOB.     Standing balance support: Bilateral upper extremity supported Standing balance-Leahy Scale: Poor Standing balance comment: likely poor today due to lack of having her prosthesis.                               Pertinent Vitals/Pain No DOE with mobility.  O2 2 L [place] left on entire session.  She only seemed to be in mild distress as she had  several coughing spells that seemed productive (rattling sounds as she coughed), but she could never cough up the mucus.      Home Living Family/patient expects to be discharged to:: Private residence Living Arrangements: Spouse/significant other (husband is unable to physically help her) Available Help at Discharge: Family;Available 24 hours/day Type of Home: House Home Access: Ramped entrance     Home Layout: One level Home Equipment: Walker - 2 wheels;Wheelchair - Manufacturing systems engineermanual;Shower seat - built in      Prior Function Level of Independence: Independent with assistive device(s)               Hand  Dominance   Dominant Hand: Right    Extremity/Trunk Assessment   Upper Extremity Assessment: Overall WFL for tasks assessed           Lower Extremity Assessment: LLE deficits/detail RLE Deficits / Details: 4/5 LLE Deficits / Details: previous BKA  Cervical / Trunk Assessment: Kyphotic  Communication   Communication: No difficulties  Cognition Arousal/Alertness: Awake/alert Behavior During Therapy: WFL for tasks assessed/performed Overall Cognitive Status: History of cognitive impairments - at baseline Area of Impairment: Memory;Problem solving     Memory: Decreased short-term memory       Problem Solving: Slow processing;Difficulty sequencing;Requires verbal cues;Requires tactile cues General Comments: Pt slow to process cues for getting back into the bed.  Seemed confused about how she was going to do it.               Assessment/Plan    PT Assessment Patient needs continued PT services  PT Diagnosis Difficulty walking;Generalized weakness;Abnormality of gait   PT Problem List Decreased strength;Decreased activity tolerance;Decreased balance;Decreased mobility;Decreased knowledge of use of DME;Cardiopulmonary status limiting activity  PT Treatment Interventions DME instruction;Gait training;Functional mobility training;Therapeutic activities;Therapeutic exercise;Balance training;Neuromuscular re-education;Patient/family education   PT Goals (Current goals can be found in the Care Plan section) Acute Rehab PT Goals Patient Stated Goal: to get stronger, feel better, get rid of her cough PT Goal Formulation: With patient Time For Goal Achievement: 12/29/13 Potential to Achieve Goals: Good    Frequency Min 3X/week    End of Session Equipment Utilized During Treatment: Gait belt;Oxygen Activity Tolerance: Patient limited by fatigue Patient left: in bed;Other (comment) (because transporter was taking her) Nurse Communication: Mobility status         Time:  [id number] PT Time Calculation (min): 31 min   Charges:   PT Evaluation $Initial PT Evaluation Tier I: 1 Procedure PT Treatments $Therapeutic Activity: 8-22 mins        Jones Viviani B. Niki Cosman, PT, DPT [phone]   12/15/2013, 4:14 PM

## 2013-12-16 LAB — GLUCOSE, CAPILLARY
GLUCOSE-CAPILLARY: 203 mg/dL — AB (ref 70–99)
GLUCOSE-CAPILLARY: 135 mg/dL — AB (ref 70–99)
Glucose-Capillary: 192 mg/dL — ABNORMAL HIGH (ref 70–99)

## 2013-12-16 LAB — CBC
HCT: 31.8 % — ABNORMAL LOW (ref 36.0–46.0)
HEMOGLOBIN: 10.9 g/dL — AB (ref 12.0–15.0)
MCH: 29.8 pg (ref 26.0–34.0)
MCHC: 34.3 g/dL (ref 30.0–36.0)
MCV: 86.9 fL (ref 78.0–100.0)
Platelets: 144 10*3/uL — ABNORMAL LOW (ref 150–400)
RBC: 3.66 MIL/uL — AB (ref 3.87–5.11)
RDW: 16.8 % — ABNORMAL HIGH (ref 11.5–15.5)
WBC: 8.6 10*3/uL (ref 4.0–10.5)

## 2013-12-16 LAB — BASIC METABOLIC PANEL
BUN: 28 mg/dL — ABNORMAL HIGH (ref 6–23)
CALCIUM: 9 mg/dL (ref 8.4–10.5)
CO2: 25 meq/L (ref 19–32)
CREATININE: 3.35 mg/dL — AB (ref 0.50–1.10)
Chloride: 99 mEq/L (ref 96–112)
GFR calc Af Amer: 15 mL/min — ABNORMAL LOW (ref >90)
GFR calc non Af Amer: 13 mL/min — ABNORMAL LOW (ref >90)
Glucose, Bld: 201 mg/dL — ABNORMAL HIGH (ref 70–99)
Potassium: 4.4 mEq/L (ref 3.7–5.3)
Sodium: 141 mEq/L (ref 137–147)

## 2013-12-16 MED ORDER — DEXTROSE 50 % IV SOLN: 50.0000 mL | Freq: Once | INTRAVENOUS | Status: AC | PRN

## 2013-12-16 MED ORDER — DEXTROSE 50 % IV SOLN: 25.0000 mL | Freq: Once | INTRAVENOUS | Status: AC | PRN

## 2013-12-16 MED ORDER — VANCOMYCIN HCL 500 MG IV SOLR
500.0000 mg | Freq: Once | INTRAVENOUS | Status: AC
  Administered 2013-12-16: 500 mg via INTRAVENOUS
  Filled 2013-12-16 (×2): qty 500

## 2013-12-16 MED ORDER — MIDODRINE HCL 5 MG PO TABS
5.0000 mg | ORAL_TABLET | Freq: Two times a day (BID) | ORAL | Status: DC
  Administered 2013-12-17 – 2013-12-18 (×3): 5 mg via ORAL
  Filled 2013-12-16 (×6): qty 1

## 2013-12-16 MED ORDER — INSULIN ASPART 100 UNIT/ML ~~LOC~~ SOLN
0.0000 [IU] | Freq: Three times a day (TID) | SUBCUTANEOUS | Status: DC
  Administered 2013-12-16: 3 [IU] via SUBCUTANEOUS
  Administered 2013-12-16: 2 [IU] via SUBCUTANEOUS
  Administered 2013-12-17: 3 [IU] via SUBCUTANEOUS
  Administered 2013-12-17 – 2013-12-18 (×2): 2 [IU] via SUBCUTANEOUS

## 2013-12-16 MED ORDER — INSULIN ASPART 100 UNIT/ML ~~LOC~~ SOLN
0.0000 [IU] | Freq: Every day | SUBCUTANEOUS | Status: DC
  Administered 2013-12-17: 2 [IU] via SUBCUTANEOUS

## 2013-12-16 MED ORDER — GLUCOSE 40 % PO GEL: 1.0000 | ORAL | Status: DC | PRN

## 2013-12-16 NOTE — Progress Notes (Signed)
Physical Therapy Treatment Patient Details Name: Briana Gould MRN: [id number] DOB: Mar 22, 1946 Today's Date: 12/16/2013    History of Present Illness 68 y.o. female admitted to [facility] on 12/12/13 with SOB, weakness, and a fall.  Dx with acute respiratory failure, HCAP, acute encephalopathy, elevated triponons (likely due to demand ischemia), and fall (no head trauma or significant joint pain).  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    PT Comments    Pt was only one person assist to stand from elevated bed today, however, we were still unable to attempt gait as her left leg is too swollen to get her prosthesis donned.  Pt to leave her silicone liner on, go for short dialysis session, and PT to check back later if time to see if she can get the prosthesis on later.    Follow Up Recommendations  SNF     Equipment Recommendations  None recommended by PT    Recommendations for Other Services   NA     Precautions / Restrictions Precautions Precautions: Fall Precaution Comments: h/o falls and L BKA with prosthesis Required Braces or Orthoses: Other Brace/Splint Other Brace/Splint: LLE prosthesis    Mobility   Transfers Overall transfer level: Needs assistance Equipment used: Rolling walker (2 wheeled) Transfers: Sit to/from Stand Sit to Stand: Min assist Stand pivot transfers: From elevated surface       General transfer comment: Min assist to support trunk and stabilize RW during transition to power up to standing.  Attempted to get prosthesis to click in while standing and unsuccessful after two attempts and some retrograde massage.    Ambulation/Gait             General Gait Details: Unable to attempt because her leg is too swollen to get her prosthesis to fit.           Balance Overall balance assessment: Needs  assistance Sitting-balance support: Feet supported Sitting balance-Leahy Scale: Good Sitting balance - Comments: pt able to donn her silicone liner independently EOB and position her prosthesis to click in   Standing balance support: Bilateral upper extremity supported Standing balance-Leahy Scale: Poor Standing balance comment: needs min assist for standing balance while attempting to pull up her underwear.                     Cognition Arousal/Alertness: Awake/alert Behavior During Therapy: WFL for tasks assessed/performed Overall Cognitive Status: History of cognitive impairments - at baseline Area of Impairment: Memory               General Comments: General STM deficits    Exercises Other Exercises Other Exercises: Orange t-band given again as she lost the first one I gave her.  Other Exercises: Retro grade massage preformed on left leg in sitting to attempt to mobilize fluid enough to get her prosthesis to fit.          Pertinent Vitals/Pain See vitals flow sheet.     Home Living Family/patient expects to be discharged to:: Private residence Living Arrangements: Spouse/significant other Available Help at Discharge: Family;Available 24 hours/day Type of Home: House Home Access: Ramped entrance   Home Layout: One level Home Equipment: Walker - 2 wheels;Wheelchair - manual;Shower seat - built in;Cane - single point          PT Goals (current goals can  now be found in the care plan section) Acute Rehab PT Goals Patient Stated Goal: to get stronger, feel better, get rid of her cough Progress towards PT goals: Progressing toward goals    Frequency  Min 3X/week    PT Plan Current plan remains appropriate (until I can see her walk, SNF is safer)       End of Session Equipment Utilized During Treatment: Gait belt;Oxygen Activity Tolerance: Patient limited by fatigue;Other (comment) (limited by left leg swelling) Patient left: in bed;with call  bell/phone within reach;with family/visitor present (seated EOB with daughter (who also wears a prosthesis. )     Time: 8119-1478: 0958-1038 PT Time Calculation (min): 40 min  Charges:  $Therapeutic Activity: 23-37 mins                      Nixie Laube B. Myanna Ziesmer, PT, DPT [phone]   12/16/2013, 12:31 PM

## 2013-12-16 NOTE — Progress Notes (Addendum)
Clinical Social Work Department CLINICAL SOCIAL WORK PLACEMENT NOTE 12/16/2013  Patient:  Briana Gould,Briana Gould  Account Number:  [id number] Admit date:  12/12/2013  Clinical Social Worker:  Carren RangLINDSAY PURITZ, LCSWA  Date/time:  12/16/2013 02:29 PM  Clinical Social Work is seeking post-discharge placement for this patient at the following level of care:   SKILLED NURSING   (*CSW will update this form in Epic as items are completed)   12/16/2013  Patient/family provided with [facility] Department of Clinical Social Work's list of facilities offering this level of care within the geographic area requested by the patient (or if unable, by the patient's family).  12/16/2013  Patient/family informed of their freedom to choose among providers that offer the needed level of care, that participate in Medicare, Medicaid or managed care program needed by the patient, have an available bed and are willing to accept the patient.  12/16/2013  Patient/family informed of [facility]' ownership interest in [facility], as well as of the fact that they are under no obligation to receive care at this facility.  PASARR submitted to EDS on 12/16/2013 PASARR number received from EDS on 12/16/2013  FL2 transmitted to all facilities in geographic area requested by pt/family on  12/16/2013 FL2 transmitted to all facilities within larger geographic area on   Patient informed that his/her managed care company has contracts with or will negotiate with  certain facilities, including the following:     Patient/family informed of bed offers received:   Patient chooses bed at  Physician recommends and patient chooses bed at    Patient to be transferred to [facility] on 12/18/13- Jetta LoutBailey Morgan, LCSWA   Patient to be transferred to facility by Patient's sister in private vehicle- Jetta LoutBailey Morgan, [place]  The following physician request were entered in Epic:   Additional Comments:  Maree KrabbeLindsay Puritz, MSW,  [employer]CSWA [phone]

## 2013-12-16 NOTE — Progress Notes (Signed)
ANTIBIOTIC CONSULT NOTE - FOLLOW UP  Pharmacy Consult for Vancomycin Indication: rule out pneumonia  Allergies  Allergen Reactions  . Phenergan [Promethazine Hcl] Other (See Comments)    Causes patient to" vomit more"  . Vitamin D Analogs Rash    Patient Measurements: Height: 5\' 6"  (167.6 cm) Weight: 171 lb 1.2 oz (77.6 kg) IBW/kg (Calculated) : 59.3  Vital Signs: Temp: 98.1 F (36.7 C) (04/09 0535) Temp src: Oral (04/09 0535) BP: 137/53 mmHg (04/09 0535) Pulse Rate: 75 (04/09 1011) Intake/Output from previous day: 04/08 0701 - 04/09 0700 In: 830 [P.O.:730; IV Piggyback:100] Out: 2003  Intake/Output from this shift:    Labs:  Recent Labs  12/14/13 0112 12/15/13 0705 12/16/13 0327  WBC 11.4* 8.7 8.6  HGB 11.0* 10.9* 10.9*  PLT 138* 152 144*  CREATININE 3.18*  --  3.35*   Estimated Creatinine Clearance: 16.9 ml/min (by C-G formula based on Cr of 3.35).  Recent Labs  12/15/13 0705  [facility] 11.6     Microbiology: Recent Results (from the past 720 hour(s))  MRSA PCR SCREENING     Status: None   Collection Time    11/19/13  3:55 PM      Result Value Ref Range Status   MRSA by PCR NEGATIVE  NEGATIVE Final   Comment:            The GeneXpert MRSA Assay (FDA     approved for NASAL specimens     only), is one component of a     comprehensive MRSA colonization     surveillance program. It is not     intended to diagnose MRSA     infection nor to guide or     monitor treatment for     MRSA infections.  CULTURE, BLOOD (ROUTINE X 2)     Status: None   Collection Time    12/12/13 10:00 AM      Result Value Ref Range Status   Specimen Description BLOOD RIGHT FOREARM   Final   Special Requests BOTTLES DRAWN AEROBIC AND ANAEROBIC 10CC   Final   Culture  Setup Time     Final   Value: 12/12/2013 18:35     Performed at [facility]   Culture     Final   Value:        BLOOD CULTURE RECEIVED NO GROWTH TO DATE CULTURE WILL BE HELD FOR 5 DAYS BEFORE  ISSUING A FINAL NEGATIVE REPORT     Performed at [facility]   Report Status PENDING   Incomplete  CULTURE, BLOOD (ROUTINE X 2)     Status: None   Collection Time    12/12/13 10:04 AM      Result Value Ref Range Status   Specimen Description BLOOD RIGHT HAND   Final   Special Requests BOTTLES DRAWN AEROBIC AND ANAEROBIC 10CC   Final   Culture  Setup Time     Final   Value: 12/12/2013 18:35     Performed at [facility]   Culture     Final   Value:        BLOOD CULTURE RECEIVED NO GROWTH TO DATE CULTURE WILL BE HELD FOR 5 DAYS BEFORE ISSUING A FINAL NEGATIVE REPORT     Performed at [facility]   Report Status PENDING   Incomplete  MRSA PCR SCREENING     Status: None   Collection Time    12/12/13  5:35 PM  Result Value Ref Range Status   MRSA by PCR NEGATIVE  NEGATIVE Final   Comment:            The GeneXpert MRSA Assay (FDA     approved for NASAL specimens     only), is one component of a     comprehensive MRSA colonization     surveillance program. It is not     intended to diagnose MRSA     infection nor to guide or     monitor treatment for     MRSA infections.    Anti-infectives   Start     Dose/Rate Route Frequency Ordered Stop   12/17/13 1200  vancomycin (VANCOCIN) IVPB 750 mg/150 ml premix     750 mg 150 mL/hr over 60 Minutes Intravenous Every M-W-F (Hemodialysis) 12/15/13 1047     12/15/13 1100  vancomycin (VANCOCIN) 1,250 mg in sodium chloride 0.9 % 250 mL IVPB     1,250 mg 250 mL/hr over 60 Minutes Intravenous To Hemodialysis 12/15/13 1047 12/15/13 1210   12/13/13 1200  vancomycin (VANCOCIN) IVPB 750 mg/150 ml premix  Status:  Discontinued     750 mg 150 mL/hr over 60 Minutes Intravenous Every M-W-F (Hemodialysis) 12/12/13 0746 12/15/13 1047   12/12/13 0800  vancomycin (VANCOCIN) 1,500 mg in sodium chloride 0.9 % 500 mL IVPB     1,500 mg 250 mL/hr over 120 Minutes Intravenous  Once 12/12/13 0746 12/12/13 1114   12/12/13 0800   piperacillin-tazobactam (ZOSYN) IVPB 2.25 g     2.25 g 100 mL/hr over 30 Minutes Intravenous 3 times per day 12/12/13 0746        Assessment: 68 yo F with ESRD presented to ED 4/5 with weakness and fall.  CXR in ED concerning for infiltrates and possible PNA.  Pt was initiated on Vancomycin and Zosyn.  Noted plans today for extra 2.5 hour HD session to assist with volume removal.  Will order extra dose of Vancomycin for after HD today.  Goal of Therapy:  pre-HD Vancomycin level 15-25 mcg/ml  Plan:  Vancomycin 500 mg IV x 1 today (lower dose due to reduced time of session). Restart Vancomycin 750 mg IV with each HD session on Friday 4/10. Follow up cx data, clinical progress, and antibiotic plans.  Toys '[employer]' UsKimberly Kaylie Ritter, Pharm.D., BCPS Clinical Pharmacist Pager [phone] 12/16/2013 1:16 PM

## 2013-12-16 NOTE — Progress Notes (Signed)
Subjective:  Still coughing, BP has improved (come up) with volume removal and midodrine  Filed Vitals:   12/15/13 1305 12/15/13 2058 12/16/13 0535 12/16/13 1011  BP:  138/85 137/53   Pulse:  77 74 75  Temp:  98.3 F (36.8 C) 98.1 F (36.7 C)   TempSrc:  Oral Oral   Resp:  18 18   Height:      Weight:      SpO2: 95% 100% 99% 99%   General appearance:  Alert, in no apparent distress Resp: Bibasilar rales Cardio:  RRR without murmur or rub GI: + BS, soft and nontender Extremities:  L BKA, 1+ edema Access:  AVF @ LUA with BFR 400 cc/min  Dialysis: MWF [place] (sometimes 4x/wk as schedule allows)  4h Dry wt unclear- was 78 v 80kg Max UF 2kg has been goal BFR has been 250 2K bath Heparin none Buttonhole lower part AVF or sharp needle top section  Hect none EPO 5800 Fe none  Assessment: 1. Cough / fever / pulm infiltrates- still coughing, BP up now with vol down and midodrine 2. ESRD - HD on MWF @ [facility], K 3.9.  HD today. 3. HTN/Volume - BP up, will slightly decrease midodrine and do extra HD today for volume, still has lots of LE edema 4. Recurrent cardiac arrests / severe ASCVD with L ICA stenosis / severe pulm HTN / severe 3 V CAD (100% RCA, multiple > 80% lesionsof LAD & LCx), poor candidate for intervention or surgery  5. Anemia - Hgb 11, stable. 6. Sec HPT - No Hectorol, Renvela 2 with meals.  7. Nutrition - carb-mod renal diet, multivitamin. 8. Hx GIB - no Heparin 9. DM - per primary  Plan- now that BP is better I think we can get more volume off, lower dry wt, tolerating HD better w midodrine; HD today and tomorrow   Vinson Moselle MD  pager [phone]    cell [phone]  12/16/2013, 10:34 AM     Recent Labs Lab 12/13/13 0314 12/14/13 0112 12/16/13 0327  NA 140 138 141  K 4.2 3.9 4.4  CL 98 97 99  CO2 26 26 25   GLUCOSE 76 115* 201*  BUN 32* 24* 28*  CREATININE 4.37* 3.18* 3.35*  CALCIUM 9.0 9.0 9.0    Recent Labs Lab 12/12/13 0635  AST 30  ALT 35   ALKPHOS 129*  BILITOT 1.2  PROT 6.9  ALBUMIN 2.9*    Recent Labs Lab 12/12/13 0635  12/14/13 0112 12/15/13 0705 12/16/13 0327  WBC 13.1*  < > 11.4* 8.7 8.6  NEUTROABS 11.7*  --   --   --   --   HGB 12.1  < > 11.0* 10.9* 10.9*  HCT 36.1  < > 32.3* 31.6* 31.8*  MCV 88.3  < > 87.1 85.9 86.9  PLT 190  < > 138* 152 144*  < > = values in this interval not displayed. . famotidine  20 mg Oral QHS  . feeding supplement (NEPRO CARB STEADY)  237 mL Oral Daily  . guaiFENesin  600 mg Oral BID  . heparin  5,000 Units Subcutaneous 3 times per day  . insulin aspart  0-5 Units Subcutaneous QHS  . insulin aspart  0-9 Units Subcutaneous TID WC  . midodrine  5 mg Oral BID WC  . multivitamin  1 tablet Oral QHS  . pantoprazole  40 mg Oral BID WC  . piperacillin-tazobactam (ZOSYN)  IV  2.25 g Intravenous 3 times per  day  . sevelamer carbonate  1,600 mg Oral TID WC  . sodium chloride  3 mL Intravenous Q12H  . [START ON 12/17/2013] vancomycin  750 mg Intravenous Q M,W,F-HD     sodium chloride, acetaminophen, acetaminophen, albuterol, dextrose, dextrose, dextrose, ondansetron (ZOFRAN) IV, ondansetron, sodium chloride, white petrolatum

## 2013-12-16 NOTE — Evaluation (Signed)
Occupational Therapy Evaluation Patient Details Name: Briana Gould MRN: [id number] DOB: November 30, 1945 Today's Date: 12/16/2013    History of Present Illness 68 y.o. female admitted to [facility] on 12/12/13 with SOB, weakness, and a fall.  Dx with acute respiratory failure, HCAP, acute encephalopathy, elevated triponons (likely due to demand ischemia), and fall (no head trauma or significant joint pain).  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   Clinical Impression   Pt demonstrates decline in function with ADLs and ADL mobility with decreased strength, balance and endurance. Pt would benefit from acute OT services to address impairments to increase level of function and safety    Follow Up Recommendations  SNF;Supervision/Assistance - 24 hour    Equipment Recommendations  None recommended by OT    Recommendations for Other Services       Precautions / Restrictions Precautions Precautions: Fall Precaution Comments: h/o falls and L BKA with prosthesis Required Braces or Orthoses: Other Brace/Splint Other Brace/Splint: LLE prosthesis Restrictions Weight Bearing Restrictions: No      Mobility Bed Mobility Overal bed mobility: Needs Assistance Bed Mobility: Sit to Supine;Supine to Sit     Supine to sit: Min assist Sit to supine: Mod assist   General bed mobility comments: mod A + 2 to scoot to [facility]  Transfers Overall transfer level: Needs assistance Equipment used: Rolling walker (2 wheeled) Transfers: Sit to/from Stand Sit to Stand: Min assist Stand pivot transfers: From elevated surface       General transfer comment: Pt declined transfers with OT this a.m. Per PT note pt is min A for sit - stand, SPT    Balance Overall balance assessment: Needs assistance Sitting-balance support: No upper extremity supported Sitting  balance-Leahy Scale: Good Sitting balance - Comments: pt sat EOB for ADLs/grooing tasks and B UE exercises   Standing balance support: Bilateral upper extremity supported Standing balance-Leahy Scale: Poor Standing balance comment: needs min assist for standing balance while attempting to pull up her underwear.                             ADL Overall ADL's : Needs assistance/impaired     Grooming: Wash/dry hands;Wash/dry face;Set up;Supervision/safety;Sitting   Upper Body Bathing: Set up;Supervision/ safety;Sitting   Lower Body Bathing: Moderate assistance;Sitting/lateral leans   Upper Body Dressing : Supervision/safety;Set up;Sitting   Lower Body Dressing: Moderate assistance;Maximal assistance;Sitting/lateral leans     Toilet Transfer Details (indicate cue type and reason): pt declioed transfer to [facility], per PT note pt is min A with SPT           General ADL Comments: pt staed she was too fatigued for transfers and will be going to dialysis shortly     Vision  wears glasses at all times                              Pertinent Vitals/Pain No c/o pain, VSS     Hand Dominance Right   Extremity/Trunk Assessment Upper Extremity Assessment Upper Extremity Assessment: Generalized weakness   Lower Extremity Assessment Lower Extremity Assessment: Defer to PT evaluation   Cervical / Trunk Assessment Cervical / Trunk Assessment: Kyphotic   Communication Communication Communication: No difficulties   Cognition Arousal/Alertness: Awake/alert Behavior During Therapy: [facility]  for tasks assessed/performed Overall Cognitive Status: History of cognitive impairments - at baseline Area of Impairment: Memory                  General Comments   Pt pleasant and cooperative    Exercises Exercises: Other exercises Other Exercises Other Exercises: Medium grade orange theraband  Exercises B UEs 3 sets 5 reps (biceps/triceps)         Home Living  Family/patient expects to be discharged to:: Private residence Living Arrangements: Spouse/significant other Available Help at Discharge: Family;Available 24 hours/day Type of Home: House Home Access: Ramped entrance     Home Layout: One level     Bathroom Shower/Tub: Producer, television/film/videoWalk-in shower   Bathroom Toilet: Handicapped height Bathroom Accessibility: Yes How Accessible: Accessible via walker;Accessible via wheelchair Home Equipment: Dan HumphreysWalker - 2 wheels;Wheelchair - manual;Shower seat - built in;Cane - single point   Additional Comments: Husband unable to assist physically at discharge.  Lives With: Spouse;Family    Prior Functioning/Environment Level of Independence: Independent with assistive device(s)        Comments: PTA pt had housekeeper to assist with IADL cooking and cleaning    OT Diagnosis: Generalized weakness   OT Problem List: Decreased strength;Decreased activity tolerance;Impaired balance (sitting and/or standing)   OT Treatment/Interventions: Self-care/ADL training;Therapeutic exercise;Therapeutic activities;Patient/family education;Balance training    OT Goals(Current goals can be found in the care plan section) Acute Rehab OT Goals Patient Stated Goal: to get stronger, feel better, get rid of her cough ADL Goals Pt Will Perform Grooming: with min assist;standing Pt Will Perform Lower Body Bathing: with mod assist;sitting/lateral leans Pt Will Perform Lower Body Dressing: with min assist;sitting/lateral leans Pt Will Transfer to Toilet: with min assist;with min guard assist;bedside commode Additional ADL Goal #1: Pt will tolerate B UE strengthening exercises with medium grade theraband (orange) sitting EOB  OT Frequency: Min 2X/week   Barriers to D/C:  decreased caregiver support                        End of Session    Activity Tolerance: Patient limited by fatigue Patient left: in bed;with call bell/phone within reach   Time: [id number] OT Time  Calculation (min): 22 min Charges:  OT General Charges $OT Visit: 1 Procedure OT Evaluation $Initial OT Evaluation Tier I: 1 Procedure OT Treatments $Therapeutic Activity: 8-22 mins G-Codes:    Lafe GarinDenise J Damien Batty 12/16/2013, 2:39 PM

## 2013-12-16 NOTE — Progress Notes (Signed)
Clinical Social Work Department BRIEF PSYCHOSOCIAL ASSESSMENT 12/16/2013  Patient:  Briana Gould,Briana Gould     Account Number:  [id number]     Admit date:  12/12/2013  Clinical Social Worker:  Carren RangPURITZ,Waylan Busta, LCSWA  Date/Time:  12/16/2013 02:17 PM  Referred by:  RN  Date Referred:  12/16/2013 Referred for  SNF Placement   Other Referral:   Interview type:  Patient Other interview type:    PSYCHOSOCIAL DATA Living Status:  FAMILY Admitted from facility:   Level of care:   Primary support name:  Garrison ColumbusPaul S[place]land Primary support relationship to patient:  SPOUSE Degree of support available:   Good    CURRENT CONCERNS Current Concerns  Post-Acute Placement   Other Concerns:    SOCIAL WORK ASSESSMENT / PLAN Clinical Social Worker received referral for SNF placement at d/c. CSW introduced self and explained reason for visit. CSW explained SNF process and provided SNF packet to patient. Patient appeared to be in good spirits and was smiling throughout conversation. CSW explained the SNF process to patient and asked how patient felt about rehab. Patient states that she wants the rehab to get stronger so she can go back home and be with her husband and her children. Patient states she is okay with going to a SNF and would like one with a really good rehab section. Patient reported she is agreeable for SNF placement and prefers [name]. CSW encouraged patient to think about additional SNF options pending availability of preferred facility. CSW will complete FL2 for MD's signature and will update patient  when bed offers are received.   Assessment/plan status:  Psychosocial Support/Ongoing Assessment of Needs Other assessment/ plan:   Information/referral to community resources:   SNF packet    PATIENT'S/FAMILY'S RESPONSE TO PLAN OF CARE: Patient reports she is agreeable to the SNF plan and would like either [facility] or [name] but verbalizes understanding of availability .          Maree KrabbeLindsay Essie Lagunes, MSW, Theresia MajorsLCSWA [phone]

## 2013-12-16 NOTE — Progress Notes (Signed)
OT Cancellation Note  Patient Details Name: Briana Gould MRN: [id number] DOB: 05-16-1946   Cancelled Treatment:    Reason Eval/Treat Not Completed: Patient declined, stating that she was tired from PT session and requested that OT return later  Lafe GarinDenise J [name] 12/16/2013, 10:49 AM

## 2013-12-16 NOTE — Progress Notes (Signed)
[facility] Progress Note [place] TEAM 1 - Stepdown/ICU TEAM   Briana Gould ZOX:[id number] DOB: 11-15-1945 DOA: 12/12/2013 PCP: [name], MD  Brief narrative: Briana Gould is a 68 y.o. female presenting on 12/12/2013 with PMH of Diabetes, CAD, ESRD, Carotid stenosis, multiple admission for Cardiac Arrest who presents after a fall. Patient relates that she felt weak, and fell down. She had a cough for about 1 wk and was noted to by hypoxic in there ER. She underwent emergent dialysis in ER to see if this would help her respiratory distress.   Subjective: Coughing excessively and unable to talk to me. Has no questions.   Assessment/Plan: Principal Problem:   Acute respiratory failure with hypoxia - Pneumonia- (HCAP) - cont Vanc and Zosyn  Active Problems:   Diabetes mellitus with end stage renal disease - holding Amaryl- on low dose sliding scale-   Recurrent cardiac arrests / severe ASCVD with L ICA stenosis / severe pulm HTN / severe 3 V CAD (100% RCA, multiple > 80% lesionsof LAD & LCx) -  -  poor candidate for intervention or surgery   Elevated Troponins with severe CAD - Per Cardiology "not a candidate for PCI because of diffuse severe disease and inability to take antiplatelet medications in the setting of severe recurrent anemia"    BKA, LEFT LEG    ESRD on dialysis - per nephrology    PAD      GIB - --recurrent- not a candidate for Plavix    Anemia of chronic renal failure  Paroxysmal a-fib - intol to beta blocker due to problems wiht bradycardia  Code Status: Full code Family Communication: with daughter Disposition Plan: home in 1-2 days  Consultants: nephro Cardiology  Procedures: none  Antibiotics: Anti-infectives   Start     Dose/Rate Route Frequency Ordered Stop   12/17/13 1200  vancomycin (VANCOCIN) IVPB 750 mg/150 ml premix     750 mg 150 mL/hr over 60 Minutes Intravenous Every M-W-F (Hemodialysis) 12/15/13 1047      12/16/13 1600  vancomycin (VANCOCIN) 500 mg in sodium chloride 0.9 % 100 mL IVPB     500 mg 100 mL/hr over 60 Minutes Intravenous  Once 12/16/13 1319     12/15/13 1100  vancomycin (VANCOCIN) 1,250 mg in sodium chloride 0.9 % 250 mL IVPB     1,250 mg 250 mL/hr over 60 Minutes Intravenous To Hemodialysis 12/15/13 1047 12/15/13 1210   12/13/13 1200  vancomycin (VANCOCIN) IVPB 750 mg/150 ml premix  Status:  Discontinued     750 mg 150 mL/hr over 60 Minutes Intravenous Every M-W-F (Hemodialysis) 12/12/13 0746 12/15/13 1047   12/12/13 0800  vancomycin (VANCOCIN) 1,500 mg in sodium chloride 0.9 % 500 mL IVPB     1,500 mg 250 mL/hr over 120 Minutes Intravenous  Once 12/12/13 0746 12/12/13 1114   12/12/13 0800  piperacillin-tazobactam (ZOSYN) IVPB 2.25 g     2.25 g 100 mL/hr over 30 Minutes Intravenous 3 times per day 12/12/13 0746        DVT prophylaxis: Heparin  Objective: Filed Weights   12/15/13 0642 12/15/13 0746 12/15/13 1146  Weight: 79.1 kg (174 lb 6.1 oz) 79.6 kg (175 lb 7.8 oz) 77.6 kg (171 lb 1.2 oz)   Blood pressure 137/53, pulse 75, temperature 98.1 F (36.7 C), temperature source Oral, resp. rate 18, height 5\' 6"  (1.676 m), weight 77.6 kg (171 lb 1.2 oz), SpO2 99.00%.  Intake/Output Summary (Last 24 hours) at 12/16/13 1340 Last data  filed at 12/15/13 1800  Gross per 24 hour  Intake    590 ml  Output      0 ml  Net    590 ml     Exam: General: AAO x 3 Lungs: decreased BS at bases- significant cough Cardiovascular: Regular rate and rhythm without murmur gallop or rub normal S1 and S2 Abdomen: Nontender, nondistended, soft, bowel sounds positive, no rebound, no ascites, no appreciable mass Extremities: No significant cyanosis, clubbing, or edema bilateral lower extremities  Data Reviewed: Basic Metabolic Panel:  Recent Labs Lab 12/12/13 0635 12/13/13 0314 12/14/13 0112 12/16/13 0327  NA 133* 140 138 141  K 4.5 4.2 3.9 4.4  CL 88* 98 97 99  CO2 23 26 26 25    GLUCOSE 149* 76 115* 201*  BUN 48* 32* 24* 28*  CREATININE 6.66* 4.37* 3.18* 3.35*  CALCIUM 9.6 9.0 9.0 9.0   Liver Function Tests:  Recent Labs Lab 12/12/13 0635  AST 30  ALT 35  ALKPHOS 129*  BILITOT 1.2  PROT 6.9  ALBUMIN 2.9*   No results found for this basename: LIPASE, AMYLASE,  in the last 168 hours No results found for this basename: AMMONIA,  in the last 168 hours CBC:  Recent Labs Lab 12/12/13 0635 12/13/13 0314 12/14/13 0112 12/15/13 0705 12/16/13 0327  WBC 13.1* 11.0* 11.4* 8.7 8.6  NEUTROABS 11.7*  --   --   --   --   HGB 12.1 10.7* 11.0* 10.9* 10.9*  HCT 36.1 31.9* 32.3* 31.6* 31.8*  MCV 88.3 87.4 87.1 85.9 86.9  PLT 190 147* 138* 152 144*   Cardiac Enzymes:  Recent Labs Lab 12/13/13 1200 12/13/13 1925 12/14/13 0112  TROPONINI 1.08* 0.86* 0.88*   BNP (last 3 results)  Recent Labs  09/20/13 0913 09/20/13 1155  PROBNP 57457.0* 47172.0*   CBG:  Recent Labs Lab 12/15/13 0610 12/15/13 1645 12/15/13 2119 12/16/13 0623 12/16/13 1114  GLUCAP 182* 181* 179* 192* 203*    Recent Results (from the past 240 hour(s))  CULTURE, BLOOD (ROUTINE X 2)     Status: None   Collection Time    12/12/13 10:00 AM      Result Value Ref Range Status   Specimen Description BLOOD RIGHT FOREARM   Final   Special Requests BOTTLES DRAWN AEROBIC AND ANAEROBIC 10CC   Final   Culture  Setup Time     Final   Value: 12/12/2013 18:35     Performed at [facility]   Culture     Final   Value:        BLOOD CULTURE RECEIVED NO GROWTH TO DATE CULTURE WILL BE HELD FOR 5 DAYS BEFORE ISSUING A FINAL NEGATIVE REPORT     Performed at [facility]   Report Status PENDING   Incomplete  CULTURE, BLOOD (ROUTINE X 2)     Status: None   Collection Time    12/12/13 10:04 AM      Result Value Ref Range Status   Specimen Description BLOOD RIGHT HAND   Final   Special Requests BOTTLES DRAWN AEROBIC AND ANAEROBIC 10CC   Final   Culture  Setup Time     Final    Value: 12/12/2013 18:35     Performed at [facility]   Culture     Final   Value:        BLOOD CULTURE RECEIVED NO GROWTH TO DATE CULTURE WILL BE HELD FOR 5 DAYS BEFORE ISSUING A FINAL NEGATIVE  REPORT     Performed at [facility]   Report Status PENDING   Incomplete  MRSA PCR SCREENING     Status: None   Collection Time    12/12/13  5:35 PM      Result Value Ref Range Status   MRSA by PCR NEGATIVE  NEGATIVE Final   Comment:            The GeneXpert MRSA Assay (FDA     approved for NASAL specimens     only), is one component of a     comprehensive MRSA colonization     surveillance program. It is not     intended to diagnose MRSA     infection nor to guide or     monitor treatment for     MRSA infections.     Studies:  Recent x-ray studies have been reviewed in detail by the Attending Physician  Scheduled Meds:  Scheduled Meds: . famotidine  20 mg Oral QHS  . feeding supplement (NEPRO CARB STEADY)  237 mL Oral Daily  . guaiFENesin  600 mg Oral BID  . heparin  5,000 Units Subcutaneous 3 times per day  . insulin aspart  0-5 Units Subcutaneous QHS  . insulin aspart  0-9 Units Subcutaneous TID WC  . midodrine  5 mg Oral BID WC  . multivitamin  1 tablet Oral QHS  . pantoprazole  40 mg Oral BID WC  . piperacillin-tazobactam (ZOSYN)  IV  2.25 g Intravenous 3 times per day  . sevelamer carbonate  1,600 mg Oral TID WC  . sodium chloride  3 mL Intravenous Q12H  . vancomycin  500 mg Intravenous Once  . [START ON 12/17/2013] vancomycin  750 mg Intravenous Q M,W,F-HD   Continuous Infusions:   Time spent on care of this patient: 30 min   Calvert Cantor, MD 12/16/2013, 1:40 PM  LOS: 4 days   [facility] Office  [phone] Pager - Text Page per Loretha Stapler   If 7PM-7AM, please contact night-coverage [email or web address]

## 2013-12-17 DIAGNOSIS — J189 Pneumonia, unspecified organism: Secondary | ICD-10-CM

## 2013-12-17 DIAGNOSIS — N186 End stage renal disease: Secondary | ICD-10-CM

## 2013-12-17 DIAGNOSIS — G9340 Encephalopathy, unspecified: Secondary | ICD-10-CM

## 2013-12-17 DIAGNOSIS — J9600 Acute respiratory failure, unspecified whether with hypoxia or hypercapnia: Secondary | ICD-10-CM

## 2013-12-17 LAB — GLUCOSE, CAPILLARY
GLUCOSE-CAPILLARY: 240 mg/dL — AB (ref 70–99)
GLUCOSE-CAPILLARY: 232 mg/dL — AB (ref 70–99)
Glucose-Capillary: 192 mg/dL — ABNORMAL HIGH (ref 70–99)

## 2013-12-17 MED ORDER — PREDNISOLONE ACETATE 1 % OP SUSP
1.0000 [drp] | Freq: Three times a day (TID) | OPHTHALMIC | Status: DC
  Administered 2013-12-17 – 2013-12-18 (×2): 1 [drp] via OPHTHALMIC
  Filled 2013-12-17: qty 1

## 2013-12-17 MED ORDER — LEVOFLOXACIN 250 MG PO TABS: 250.0000 mg | ORAL_TABLET | ORAL | Status: DC

## 2013-12-17 MED ORDER — LEVOFLOXACIN IN D5W 500 MG/100ML IV SOLN
500.0000 mg | Freq: Once | INTRAVENOUS | Status: AC
  Filled 2013-12-17: qty 100

## 2013-12-17 MED ORDER — MIDODRINE HCL 5 MG PO TABS
ORAL_TABLET | ORAL | Status: AC
  Filled 2013-12-17: qty 1

## 2013-12-17 MED ORDER — PREDNISOLONE ACETATE 1 % OP SUSP
1.0000 [drp] | Freq: Three times a day (TID) | OPHTHALMIC | Status: DC
  Filled 2013-12-17: qty 1

## 2013-12-17 MED ORDER — FLUTICASONE PROPIONATE HFA 110 MCG/ACT IN AERO: 2.0000 | INHALATION_SPRAY | Freq: Two times a day (BID) | RESPIRATORY_TRACT | Status: DC

## 2013-12-17 MED ORDER — LEVOFLOXACIN IN D5W 500 MG/100ML IV SOLN
500.0000 mg | Freq: Once | INTRAVENOUS | Status: AC
  Administered 2013-12-17: 500 mg via INTRAVENOUS
  Filled 2013-12-17: qty 100

## 2013-12-17 MED ORDER — GUAIFENESIN ER 600 MG PO TB12: 600.0000 mg | ORAL_TABLET | Freq: Two times a day (BID) | ORAL | Status: DC

## 2013-12-17 MED ORDER — DEXTROMETHORPHAN POLISTIREX 30 MG/5ML PO LQCR
30.0000 mg | Freq: Two times a day (BID) | ORAL | Status: DC
  Administered 2013-12-17 – 2013-12-18 (×3): 30 mg via ORAL
  Filled 2013-12-17 (×4): qty 5

## 2013-12-17 MED ORDER — FLUTICASONE PROPIONATE HFA 110 MCG/ACT IN AERO
2.0000 | INHALATION_SPRAY | Freq: Two times a day (BID) | RESPIRATORY_TRACT | Status: DC
  Administered 2013-12-17 – 2013-12-18 (×3): 2 via RESPIRATORY_TRACT
  Filled 2013-12-17: qty 12

## 2013-12-17 MED ORDER — PREDNISOLONE ACETATE 1 % OP SUSP: 1.0000 [drp] | Freq: Three times a day (TID) | OPHTHALMIC | Status: AC

## 2013-12-17 MED ORDER — ALBUTEROL SULFATE (2.5 MG/3ML) 0.083% IN NEBU: 2.5000 mg | INHALATION_SOLUTION | RESPIRATORY_TRACT | Status: DC | PRN

## 2013-12-17 MED ORDER — GLIMEPIRIDE 1 MG PO TABS
1.0000 mg | ORAL_TABLET | Freq: Every day | ORAL | Status: DC
  Administered 2013-12-17 – 2013-12-18 (×2): 1 mg via ORAL
  Filled 2013-12-17 (×3): qty 1

## 2013-12-17 MED ORDER — DEXTROMETHORPHAN POLISTIREX 30 MG/5ML PO LQCR
30.0000 mg | Freq: Two times a day (BID) | ORAL | Status: DC | PRN
  Filled 2013-12-17: qty 5

## 2013-12-17 MED ORDER — DEXTROMETHORPHAN POLISTIREX 30 MG/5ML PO LQCR: 30.0000 mg | Freq: Two times a day (BID) | ORAL | Status: DC

## 2013-12-17 NOTE — Progress Notes (Signed)
Subjective:  No complaints  Filed Vitals:   12/16/13 1900 12/16/13 2006 12/17/13 0256 12/17/13 0622  BP: 128/53 154/75 163/73   Pulse: 81 82 81   Temp:  99 F (37.2 C) 98.7 F (37.1 C)   TempSrc:  Oral Oral   Resp: 20 18 18    Height:      Weight:    77.8 kg (171 lb 8.3 oz)  SpO2: 100% 96% 97%    General appearance:  Alert, in no apparent distress Resp: Bibasilar rales Cardio:  RRR without murmur or rub GI: + BS, soft and nontender Extremities:  L BKA, 1+ edema Access:  AVF @ LUA with BFR 400 cc/min  Dialysis: MWF [place] (sometimes 4x/wk as schedule allows)  4h Dry wt unclear- was 78 v 80kg Max UF 2kg has been goal BFR has been 250 2K bath Heparin none Buttonhole lower part AVF or sharp needle top section  Hect none EPO 5800 Fe none  Assessment: 1. Cough / fever / pulm infiltrates- PNA +/- edema 2. ESRD - HD again today, challenge volume 3. HTN/volume- BP's low started midodrine with improvement, still vol excess 4. Recurrent cardiac arrests / severe ASCVD with L ICA stenosis / severe pulm HTN / severe 3 V CAD (100% RCA, multiple > 80% lesionsof LAD & LCx), poor candidate for intervention or surgery  5. Anemia - Hgb 11, stable. 6. Sec HPT - No Hectorol, Renvela 2 with meals.  7. Nutrition - carb-mod renal diet, multivitamin. 8. Hx GIB - no Heparin 9. DM - per primary  Plan- HD again today, possibly tomorrow if still here as well, lower dry wt as BP tolerates   Briana Moselleob Lakela Kuba MD  pager [phone]    cell [phone]  12/17/2013, 7:11 AM     Recent Labs Lab 12/13/13 0314 12/14/13 0112 12/16/13 0327  NA 140 138 141  K 4.2 3.9 4.4  CL 98 97 99  CO2 26 26 25   GLUCOSE 76 115* 201*  BUN 32* 24* 28*  CREATININE 4.37* 3.18* 3.35*  CALCIUM 9.0 9.0 9.0    Recent Labs Lab 12/12/13 0635  AST 30  ALT 35  ALKPHOS 129*  BILITOT 1.2  PROT 6.9  ALBUMIN 2.9*    Recent Labs Lab 12/12/13 0635  12/14/13 0112 12/15/13 0705 12/16/13 0327  WBC 13.1*  < > 11.4* 8.7  8.6  NEUTROABS 11.7*  --   --   --   --   HGB 12.1  < > 11.0* 10.9* 10.9*  HCT 36.1  < > 32.3* 31.6* 31.8*  MCV 88.3  < > 87.1 85.9 86.9  PLT 190  < > 138* 152 144*  < > = values in this interval not displayed. . famotidine  20 mg Oral QHS  . feeding supplement (NEPRO CARB STEADY)  237 mL Oral Daily  . guaiFENesin  600 mg Oral BID  . heparin  5,000 Units Subcutaneous 3 times per day  . insulin aspart  0-5 Units Subcutaneous QHS  . insulin aspart  0-9 Units Subcutaneous TID WC  . midodrine  5 mg Oral BID WC  . multivitamin  1 tablet Oral QHS  . pantoprazole  40 mg Oral BID WC  . piperacillin-tazobactam (ZOSYN)  IV  2.25 g Intravenous 3 times per day  . sevelamer carbonate  1,600 mg Oral TID WC  . sodium chloride  3 mL Intravenous Q12H  . vancomycin  750 mg Intravenous Q M,W,F-HD     sodium chloride, acetaminophen, acetaminophen,  albuterol, dextromethorphan, dextrose, ondansetron (ZOFRAN) IV, ondansetron, sodium chloride, white petrolatum

## 2013-12-17 NOTE — Progress Notes (Signed)
PT Cancellation Note  Patient Details Name: Briana Gould MRN: [id number] DOB: [birth year]   Cancelled Treatment:    Reason Eval/Treat Not Completed: Patient at procedure or test/unavailable   Lurena Joinerebecca B. Elvira Langston, PT, DPT [phone]   12/17/2013, 1:59 PM

## 2013-12-17 NOTE — Procedures (Signed)
I was present at this dialysis session, have reviewed the session itself and made  appropriate changes  Vinson Moselleob Raylee Adamec MD (pgr) [phone]    [phone]) [phone] 12/17/2013, 4:09 PM

## 2013-12-17 NOTE — Discharge Summary (Addendum)
Physician Discharge Summary  Damian Brynlynn Walko AVW:[id number] DOB: 1945/12/20 DOA: 12/12/2013  PCP: [name], MD  Admit date: 12/12/2013 Discharge date: 12/17/2013  Time spent: >45 minutes  Recommendations for Outpatient Follow-up:  1. Cont to monitor symptoms of HCAP- giving total of 14 days of antibiotics 2. D/c to [name] nursing home  Discharge Diagnoses:  Principal Problem:   Acute respiratory failure with hypoxia--  HCAP (healthcare-associated pneumonia) Active Problems:   Diabetes mellitus with end stage renal disease   HYPERLIPIDEMIA   PULMONARY HYPERTENSION   BKA, LEFT LEG   ESRD (end stage renal disease) on dialysis   PAD (peripheral artery disease)   CAD- severe 3V CAD at cath March 2015- medical Rx   GIB (gastrointestinal bleeding)--recurrent- not a candidate for Plavix   Anemia of chronic renal failure   Bradycardia- intol to beta blocker   Cardiac arrest x 3- Jan, Feb, March 2015   Paroxysmal a-fib Abnormal arteriogram performed 09/30/2013  Revealed: L ICA 95%stenosis  R MCA 50% stenosis  R ICA 65% stenosis    Discharge Condition: stable  Diet recommendation: heart healthy, carb modified, renal diet  Filed Weights   12/16/13 1610 12/16/13 1852 12/17/13 0622  Weight: 80.9 kg (178 lb 5.6 oz) 78.6 kg (173 lb 4.5 oz) 77.8 kg (171 lb 8.3 oz)    History of present illness:  Briana Gould is a 68 y.o. female presenting on 12/12/2013 with PMH of Diabetes, CAD, ESRD, Carotid stenosis, multiple admission for Cardiac Arrest who presents after a fall. Patient relates that she felt weak, and fell down. She had a cough for about 1 wk and was noted to by hypoxic in there ER. She underwent emergent dialysis in ER to see if this would help her respiratory distress. It is felt that her distress was due mainly due to [facility]  Hospital Course:  Acute respiratory failure with hypoxia  - Pneumonia- (HCAP) - received Vanc and Zosyn for 5 days- will switch to Levaquin  for 7 more days - her remaining issue at this time is severe cough- she is not hypoxic or dyspneic   Active Problems:  Diabetes mellitus with end stage renal disease  - stable   Recurrent cardiac arrests / severe ASCVD with L ICA stenosis / severe pulm HTN / severe 3 V CAD (100% RCA, multiple > 80% lesionsof LAD & LCx) -  - poor candidate for intervention or surgery   Elevated Troponins with severe CAD  - Per Cardiology "not a candidate for PCI because of diffuse severe disease and inability to take antiplatelet medications in the setting of severe recurrent anemia"   BKA, LEFT LEG   ESRD on dialysis  - per nephrology   PAD   GIB  - --recurrent- not a candidate for Plavix   Anemia of chronic renal failure   Paroxysmal a-fib  - intolerant of beta blocker due to problems wiht bradycardia   Left ICA Stenosis- 90% - is aware of this and has an appt to f/u with IR    Procedures:  none  Consultations:  nephrology  Discharge Exam: Filed Vitals:   12/17/13 0256  BP: 163/73  Pulse: 81  Temp: 98.7 F (37.1 C)  Resp: 18    General: AAO x 3, no distress Cardiovascular: RRR, no murmurs Respiratory: CTA b/l - persistent cough  Discharge Instructions You were cared for by a hospitalist during your hospital stay. If you have any questions about your discharge medications or the care you received while  you were in the hospital after you are discharged, you can call the unit and asked to speak with the hospitalist on call if the hospitalist that took care of you is not available. Once you are discharged, your primary care physician will handle any further medical issues. Please note that NO REFILLS for any discharge medications will be authorized once you are discharged, as it is imperative that you return to your primary care physician (or establish a relationship with a primary care physician if you do not have one) for your aftercare needs so that they can reassess your need  for medications and monitor your lab values.     Medication List         albuterol (2.5 MG/3ML) 0.083% nebulizer solution  Commonly known as:  PROVENTIL  Take 3 mLs (2.5 mg total) by nebulization every 4 (four) hours as needed for wheezing.     b complex-vitamin c-folic acid 0.8 MG Tabs tablet  Take 1 tablet by mouth daily.     dextromethorphan 30 MG/5ML liquid  Commonly known as:  DELSYM  Take 5 mLs (30 mg total) by mouth 2 (two) times daily.     famotidine 20 MG tablet  Commonly known as:  PEPCID  Take 1 tablet (20 mg total) by mouth at bedtime.     feeding supplement (NEPRO CARB STEADY) Liqd  Take 237 mLs by mouth daily.     fluticasone 110 MCG/ACT inhaler  Commonly known as:  FLOVENT HFA  Inhale 2 puffs into the lungs 2 (two) times daily.     glimepiride 1 MG tablet  Commonly known as:  AMARYL  Take 1 tablet (1 mg total) by mouth daily with breakfast.     guaiFENesin 600 MG 12 hr tablet  Commonly known as:  MUCINEX  Take 1 tablet (600 mg total) by mouth 2 (two) times daily.     levofloxacin 250 MG tablet  Commonly known as:  LEVAQUIN  Take 1 tablet (250 mg total) by mouth every other day.  Start taking on:  12/19/2013     midodrine 5 MG tablet  Commonly known as:  PROAMATINE  Take 1 tablet (5 mg total) by mouth every Monday, Wednesday, and Friday with hemodialysis.     pantoprazole 40 MG tablet  Commonly known as:  PROTONIX  Take 1 tablet (40 mg total) by mouth 2 (two) times daily.     prednisoLONE acetate 1 % ophthalmic suspension  Commonly known as:  PRED FORTE  Place 1 drop into both eyes 3 (three) times daily.     sevelamer carbonate 800 MG tablet  Commonly known as:  RENVELA  Take 1,600 mg by mouth 3 (three) times daily with meals.       Allergies  Allergen Reactions  . Phenergan [Promethazine Hcl] Other (See Comments)    Causes patient to" vomit more"  . Vitamin D Analogs Rash      The results of significant diagnostics from this  hospitalization (including imaging, microbiology, ancillary and laboratory) are listed below for reference.    Significant Diagnostic Studies: Dg Chest 2 View  12/13/2013   CLINICAL DATA:  Followup pulmonary infiltrates edema versus pneumonia, history end-stage renal disease on dialysis, diabetes, peripheral vascular disease, coronary artery disease, hypertension, pulmonary hypertension, stroke  EXAM: CHEST  2 VIEW  COMPARISON:  12/12/2013  FINDINGS: Enlargement of cardiac silhouette.  Slight pulmonary vascular congestion.  Patchy pulmonary infiltrates little changed.  Blunting of left posterior costophrenic angle on lateral view  by small effusion.  No pneumothorax.  Osseous structures stable.  Slight rotation to the right on the PA view.  IMPRESSION: Persistent patchy pulmonary infiltrates could represent edema or infection, little changed.   Electronically Signed   By: Ulyses Southward M.D.   On: 12/13/2013 19:00   Dg Chest 2 View  12/12/2013   CLINICAL DATA:  Cough  EXAM: CHEST  2 VIEW  COMPARISON:  11/19/2013  FINDINGS: Cardiac shadow remains enlarged. Patchy infiltrative changes are noted throughout both lungs but most prominent in the right middle and lower lobes. No acute bony abnormality is seen.  IMPRESSION: Diffuse bilateral patchy infiltrates. Followup films are recommended following appropriate therapy.   Electronically Signed   By: Alcide Clever M.D.   On: 12/12/2013 07:12   Ct Head Wo Contrast  12/15/2013   CLINICAL DATA:  Encephalopathy.  EXAM: CT HEAD WITHOUT CONTRAST  TECHNIQUE: Contiguous axial images were obtained from the base of the skull through the vertex without intravenous contrast.  COMPARISON:  11/19/2013  FINDINGS: Ventricles are normal in configuration. There is ventricular and sulcal enlargement reflecting mild atrophy. No hydrocephalus.  No parenchymal masses or mass effect. Small focus of hypoattenuation is noted in the deep anterior right frontal lobe white matter, likely an old  lacune infarct or a focus of chronic microvascular ischemic change. There is evidence of an old lacune infarct along the superior left cerebellum. Subtle mid pontine lacune infarct is also noted. There is no evidence of a cortical infarct.  There are no extra-axial masses or abnormal fluid collections.  There is no intracranial hemorrhage.  Visualized sinuses and mastoid air cells are clear. No skull lesion.  No change from the prior study.  IMPRESSION: 1. No acute findings. 2. Atrophy. Small lacunar infarcts in the central pons and superior left cerebellum. Small deep white matter lacune infarct versus focus of ischemic change in the right frontal lobe.   Electronically Signed   By: Amie Portland M.D.   On: 12/15/2013 16:39   Ct Head Wo Contrast  11/19/2013   CLINICAL DATA:  Cardiac arrest during dialysis. Vomiting after resuscitation. Headache.  EXAM: CT HEAD WITHOUT CONTRAST  TECHNIQUE: Contiguous axial images were obtained from the base of the skull through the vertex without intravenous contrast.  COMPARISON:  IR ANGIO VERTEBRAL SEL SUBCLAVIAN INNOMINATE BILAT MOD SED dated 09/28/2013; CT HEAD W/O CM dated 09/20/2013  FINDINGS: Focal central pontine hypodensity, image 10 of series 2, stable, most compatible with remote lacunar infarct.  Otherwise, the brainstem, cerebellum, cerebral peduncles, thalamus, basal ganglia, basilar cisterns, and ventricular system appear within normal limits. No intracranial hemorrhage, mass lesion, or acute CVA. There is atherosclerotic calcification of the cavernous carotid arteries bilaterally.  IMPRESSION: 1. Small remote lacunar infarct in the mid pons. No acute intracranial findings.   Electronically Signed   By: Herbie Baltimore M.D.   On: 11/19/2013 11:13   Dg Chest Port 1 View  12/15/2013   CLINICAL DATA:  dyspnea.  EXAM: PORTABLE CHEST - 1 VIEW  COMPARISON:  DG CHEST 2 VIEW dated 12/13/2013  FINDINGS: Low lung volumes. Cardiac silhouette enlarged. Diffuse bilateral  pulmonary opacities are appreciated stable when compared to previous study. No new focal regions of consolidation or new focal infiltrates. No acute osseous abnormalities.  IMPRESSION: Stable bilateral pulmonary infiltrates. Differential considerations again include edema versus infection. Stable trace blunting of the left costophrenic angle.   Electronically Signed   By: Salome Holmes M.D.   On: 12/15/2013 15:10  Dg Chest Port 1 View  11/19/2013   CLINICAL DATA:  Loss of consciousness  EXAM: PORTABLE CHEST - 1 VIEW  COMPARISON:  September 25, 2013  FINDINGS: The heart size and mediastinal contours are stable. The heart size is enlarged. There is pulmonary edema. There is no focal pneumonia or pleural effusion. The visualized skeletal structures are stable.  IMPRESSION: Congestive heart failure.   Electronically Signed   By: Sherian ReinWei-Chen  Lin M.D.   On: 11/19/2013 09:57    Microbiology: Recent Results (from the past 240 hour(s))  CULTURE, BLOOD (ROUTINE X 2)     Status: None   Collection Time    12/12/13 10:00 AM      Result Value Ref Range Status   Specimen Description BLOOD RIGHT FOREARM   Final   Special Requests BOTTLES DRAWN AEROBIC AND ANAEROBIC 10CC   Final   Culture  Setup Time     Final   Value: 12/12/2013 18:35     Performed at [facility]   Culture     Final   Value:        BLOOD CULTURE RECEIVED NO GROWTH TO DATE CULTURE WILL BE HELD FOR 5 DAYS BEFORE ISSUING A FINAL NEGATIVE REPORT     Performed at [facility]   Report Status PENDING   Incomplete  CULTURE, BLOOD (ROUTINE X 2)     Status: None   Collection Time    12/12/13 10:04 AM      Result Value Ref Range Status   Specimen Description BLOOD RIGHT HAND   Final   Special Requests BOTTLES DRAWN AEROBIC AND ANAEROBIC 10CC   Final   Culture  Setup Time     Final   Value: 12/12/2013 18:35     Performed at [facility]   Culture     Final   Value:        BLOOD CULTURE RECEIVED NO GROWTH TO DATE CULTURE  WILL BE HELD FOR 5 DAYS BEFORE ISSUING A FINAL NEGATIVE REPORT     Performed at [facility]   Report Status PENDING   Incomplete  MRSA PCR SCREENING     Status: None   Collection Time    12/12/13  5:35 PM      Result Value Ref Range Status   MRSA by PCR NEGATIVE  NEGATIVE Final   Comment:            The GeneXpert MRSA Assay (FDA     approved for NASAL specimens     only), is one component of a     comprehensive MRSA colonization     surveillance program. It is not     intended to diagnose MRSA     infection nor to guide or     monitor treatment for     MRSA infections.     Labs: Basic Metabolic Panel:  Recent Labs Lab 12/12/13 0635 12/13/13 0314 12/14/13 0112 12/16/13 0327  NA 133* 140 138 141  K 4.5 4.2 3.9 4.4  CL 88* 98 97 99  CO2 23 26 26 25   GLUCOSE 149* 76 115* 201*  BUN 48* 32* 24* 28*  CREATININE 6.66* 4.37* 3.18* 3.35*  CALCIUM 9.6 9.0 9.0 9.0   Liver Function Tests:  Recent Labs Lab 12/12/13 0635  AST 30  ALT 35  ALKPHOS 129*  BILITOT 1.2  PROT 6.9  ALBUMIN 2.9*   No results found for this basename: LIPASE, AMYLASE,  in the last 168 hours  No results found for this basename: AMMONIA,  in the last 168 hours CBC:  Recent Labs Lab 12/12/13 0635 12/13/13 0314 12/14/13 0112 12/15/13 0705 12/16/13 0327  WBC 13.1* 11.0* 11.4* 8.7 8.6  NEUTROABS 11.7*  --   --   --   --   HGB 12.1 10.7* 11.0* 10.9* 10.9*  HCT 36.1 31.9* 32.3* 31.6* 31.8*  MCV 88.3 87.4 87.1 85.9 86.9  PLT 190 147* 138* 152 144*   Cardiac Enzymes:  Recent Labs Lab 12/13/13 1200 12/13/13 1925 12/14/13 0112  TROPONINI 1.08* 0.86* 0.88*   BNP: BNP (last 3 results)  Recent Labs  09/20/13 0913 09/20/13 1155  PROBNP 57457.0* 47172.0*   CBG:  Recent Labs Lab 12/16/13 0623 12/16/13 1114 12/16/13 2015 12/17/13 0603 12/17/13 1117  GLUCAP 192* 203* 135* 192* 240*       Signed:  Calvert Cantor, MD [facility] 12/17/2013, 1:54 PM

## 2013-12-17 NOTE — Progress Notes (Signed)
CSW spoke to patient and patient's daughter by bedside. Patient really wants [name] for SNF. CSW explained that [name] is able to take patient, but does not transport for dialysis. Patient states she will have someone take her to dialysis. Patient's daughter expressed her concern about why CIR was not recommended. CSW explained that she will page PT and see if they can speak with patient's daughter, at daughter's request. CSW notified MD that [name] can take patient tomorrow if dc paperwork is done today.  Maree KrabbeLindsay Merisa Julio, MSW, Theresia MajorsLCSWA [phone]

## 2013-12-18 LAB — CULTURE, BLOOD (ROUTINE X 2)
CULTURE: NO GROWTH
Culture: NO GROWTH

## 2013-12-18 LAB — GLUCOSE, CAPILLARY: Glucose-Capillary: 182 mg/dL — ABNORMAL HIGH (ref 70–99)

## 2013-12-18 NOTE — Progress Notes (Signed)
Patient is medically stable for D/C today to [facility]. [facility] going to a room on [address]. Clinical Child psychotherapistocial Worker (CSW) prepared D/C packet and gave it to patient to take with her to [facility]. CSW faxed D/C summary to [facility]. Patient reported that her sister is transporting her in a private vehicle. Please reconsult if further social work needs arise. CSW signing off.   Jetta LoutBailey Morgan, LCSWA Weekend CSW [phone]

## 2013-12-18 NOTE — Progress Notes (Signed)
[facility] Progress Note [place] TEAM 1 - Stepdown/ICU TEAM   [name] ZOX:[id number] DOB: [birth year] DOA: 12/12/2013 PCP: Laurena SlimmerLARK,PRESTON S, MD  Brief narrative: Briana Gould is a 68 y.o. female presenting on 12/12/2013 with PMH of Diabetes, CAD, ESRD, Carotid stenosis, multiple admission for Cardiac Arrest who presents after a fall. Patient relates that she felt weak, and fell down. She had a cough for about 1 wk and was noted to by hypoxic in there ER. She underwent emergent dialysis in ER to see if this would help her respiratory distress.   GOING TO SNF TODAY- D/C SUMMARY COMPLETED ON 4/10  Subjective: Doing quite well today- cough is much better with the interventions started yesterday. Ready to go to SNF today.   Assessment/Plan: Principal Problem:   Acute respiratory failure with hypoxia - Pneumonia- (HCAP) - transitioned to Levaquin on 4/10  Active Problems:   Diabetes mellitus with end stage renal disease - cont Amaryl on d/c  Recurrent cardiac arrests / severe ASCVD with L ICA stenosis / severe pulm HTN / severe 3 V CAD (100% RCA, multiple > 80% lesionsof LAD & LCx) -  -  poor candidate for intervention or surgery   Elevated Troponins with severe CAD - Per Cardiology "not a candidate for PCI because of diffuse severe disease and inability to take antiplatelet medications in the setting of severe recurrent anemia"    BKA, LEFT LEG    ESRD on dialysis - per nephrology    PAD      GIB - --recurrent- not a candidate for Plavix    Anemia of chronic renal failure  Paroxysmal a-fib - intol to beta blocker due to problems wiht bradycardia  Code Status: Full code Family Communication: none today Disposition Plan: SNF today  Consultants: nephro Cardiology  Procedures: none  Antibiotics: Anti-infectives   Start     Dose/Rate Route Frequency Ordered Stop   12/19/13 0000  levofloxacin (LEVAQUIN) 250 MG tablet    Comments:  NOTE- START  4/12 NOT 4/11   250 mg Oral Every 48 hours 12/17/13 1354     12/17/13 2015  levofloxacin (LEVAQUIN) IVPB 500 mg     500 mg 100 mL/hr over 60 Minutes Intravenous  Once 12/17/13 2006 12/17/13 2114   12/17/13 1500  levofloxacin (LEVAQUIN) IVPB 500 mg     500 mg 100 mL/hr over 60 Minutes Intravenous  Once 12/17/13 1353 12/17/13 2100   12/17/13 1200  vancomycin (VANCOCIN) IVPB 750 mg/150 ml premix  Status:  Discontinued     750 mg 150 mL/hr over 60 Minutes Intravenous Every M-W-F (Hemodialysis) 12/15/13 1047 12/17/13 1340   12/17/13 0000  levofloxacin (LEVAQUIN) 250 MG tablet  Status:  Discontinued     250 mg Oral Every 48 hours 12/17/13 1353 12/17/13    12/16/13 1600  vancomycin (VANCOCIN) 500 mg in sodium chloride 0.9 % 100 mL IVPB     500 mg 100 mL/hr over 60 Minutes Intravenous  Once 12/16/13 1319 12/16/13 1910   12/15/13 1100  vancomycin (VANCOCIN) 1,250 mg in sodium chloride 0.9 % 250 mL IVPB     1,250 mg 250 mL/hr over 60 Minutes Intravenous To Hemodialysis 12/15/13 1047 12/15/13 1210   12/13/13 1200  vancomycin (VANCOCIN) IVPB 750 mg/150 ml premix  Status:  Discontinued     750 mg 150 mL/hr over 60 Minutes Intravenous Every M-W-F (Hemodialysis) 12/12/13 0746 12/15/13 1047   12/12/13 0800  vancomycin (VANCOCIN) 1,500 mg in sodium chloride 0.9 % 500  mL IVPB     1,500 mg 250 mL/hr over 120 Minutes Intravenous  Once 12/12/13 0746 12/12/13 1114   12/12/13 0800  piperacillin-tazobactam (ZOSYN) IVPB 2.25 g  Status:  Discontinued     2.25 g 100 mL/hr over 30 Minutes Intravenous 3 times per day 12/12/13 0746 12/17/13 1353      DVT prophylaxis: Heparin  Objective: Filed Weights   12/17/13 1407 12/17/13 1710 12/18/13 0407  Weight: 79.4 kg (175 lb 0.7 oz) 77.6 kg (171 lb 1.2 oz) 77.7 kg (171 lb 4.8 oz)   Blood pressure 143/64, pulse 85, temperature 98.4 F (36.9 C), temperature source Oral, resp. rate 18, height 5\' 6"  (1.676 m), weight 77.7 kg (171 lb 4.8 oz), SpO2  100.00%.  Intake/Output Summary (Last 24 hours) at 12/18/13 0958 Last data filed at 12/18/13 0830  Gross per 24 hour  Intake    240 ml  Output   2000 ml  Net  -1760 ml     Exam: General: AAO x 3 Lungs: decreased BS at bases- significant cough Cardiovascular: Regular rate and rhythm without murmur gallop or rub normal S1 and S2 Abdomen: Nontender, nondistended, soft, bowel sounds positive, no rebound, no ascites, no appreciable mass Extremities: No significant cyanosis, clubbing, or edema bilateral lower extremities  Data Reviewed: Basic Metabolic Panel:  Recent Labs Lab 12/12/13 0635 12/13/13 0314 12/14/13 0112 12/16/13 0327  NA 133* 140 138 141  K 4.5 4.2 3.9 4.4  CL 88* 98 97 99  CO2 23 26 26 25   GLUCOSE 149* 76 115* 201*  BUN 48* 32* 24* 28*  CREATININE 6.66* 4.37* 3.18* 3.35*  CALCIUM 9.6 9.0 9.0 9.0   Liver Function Tests:  Recent Labs Lab 12/12/13 0635  AST 30  ALT 35  ALKPHOS 129*  BILITOT 1.2  PROT 6.9  ALBUMIN 2.9*   No results found for this basename: LIPASE, AMYLASE,  in the last 168 hours No results found for this basename: AMMONIA,  in the last 168 hours CBC:  Recent Labs Lab 12/12/13 0635 12/13/13 0314 12/14/13 0112 12/15/13 0705 12/16/13 0327  WBC 13.1* 11.0* 11.4* 8.7 8.6  NEUTROABS 11.7*  --   --   --   --   HGB 12.1 10.7* 11.0* 10.9* 10.9*  HCT 36.1 31.9* 32.3* 31.6* 31.8*  MCV 88.3 87.4 87.1 85.9 86.9  PLT 190 147* 138* 152 144*   Cardiac Enzymes:  Recent Labs Lab 12/13/13 1200 12/13/13 1925 12/14/13 0112  TROPONINI 1.08* 0.86* 0.88*   BNP (last 3 results)  Recent Labs  09/20/13 0913 09/20/13 1155  PROBNP 57457.0* 47172.0*   CBG:  Recent Labs Lab 12/16/13 2015 12/17/13 0603 12/17/13 1117 12/17/13 2137 12/18/13 0624  GLUCAP 135* 192* 240* 232* 182*    Recent Results (from the past 240 hour(s))  CULTURE, BLOOD (ROUTINE X 2)     Status: None   Collection Time    12/12/13 10:00 AM      Result Value Ref  Range Status   Specimen Description BLOOD RIGHT FOREARM   Final   Special Requests BOTTLES DRAWN AEROBIC AND ANAEROBIC 10CC   Final   Culture  Setup Time     Final   Value: 12/12/2013 18:35     Performed at [facility]   Culture     Final   Value:        BLOOD CULTURE RECEIVED NO GROWTH TO DATE CULTURE WILL BE HELD FOR 5 DAYS BEFORE ISSUING A FINAL NEGATIVE REPORT  Performed at [facility]   Report Status PENDING   Incomplete  CULTURE, BLOOD (ROUTINE X 2)     Status: None   Collection Time    12/12/13 10:04 AM      Result Value Ref Range Status   Specimen Description BLOOD RIGHT HAND   Final   Special Requests BOTTLES DRAWN AEROBIC AND ANAEROBIC 10CC   Final   Culture  Setup Time     Final   Value: 12/12/2013 18:35     Performed at [facility]   Culture     Final   Value:        BLOOD CULTURE RECEIVED NO GROWTH TO DATE CULTURE WILL BE HELD FOR 5 DAYS BEFORE ISSUING A FINAL NEGATIVE REPORT     Performed at [facility]   Report Status PENDING   Incomplete  MRSA PCR SCREENING     Status: None   Collection Time    12/12/13  5:35 PM      Result Value Ref Range Status   MRSA by PCR NEGATIVE  NEGATIVE Final   Comment:            The GeneXpert MRSA Assay (FDA     approved for NASAL specimens     only), is one component of a     comprehensive MRSA colonization     surveillance program. It is not     intended to diagnose MRSA     infection nor to guide or     monitor treatment for     MRSA infections.     Studies:  Recent x-ray studies have been reviewed in detail by the Attending Physician  Scheduled Meds:  Scheduled Meds: . dextromethorphan  30 mg Oral BID  . famotidine  20 mg Oral QHS  . feeding supplement (NEPRO CARB STEADY)  237 mL Oral Daily  . fluticasone  2 puff Inhalation BID  . glimepiride  1 mg Oral Q breakfast  . guaiFENesin  600 mg Oral BID  . heparin  5,000 Units Subcutaneous 3 times per day  . insulin aspart  0-5 Units  Subcutaneous QHS  . insulin aspart  0-9 Units Subcutaneous TID WC  . midodrine  5 mg Oral BID WC  . multivitamin  1 tablet Oral QHS  . pantoprazole  40 mg Oral BID WC  . prednisoLONE acetate  1 drop Both Eyes TID  . sevelamer carbonate  1,600 mg Oral TID WC  . sodium chloride  3 mL Intravenous Q12H   Continuous Infusions:   Time spent on care of this patient: 30 min   Calvert Cantor, MD 12/18/2013, 9:58 AM  LOS: 6 days   [facility] Office  [phone] Pager - Text Page per Loretha Stapler   If 7PM-7AM, please contact night-coverage [email or web address]

## 2013-12-18 NOTE — Progress Notes (Signed)
Pt A&Ox3. Patient without complaint. Report called to [facility] [address]. IV D/C'd. Telemetry D/C'd and returned to cubby.

## 2014-01-06 ENCOUNTER — Ambulatory Visit (INDEPENDENT_AMBULATORY_CARE_PROVIDER_SITE_OTHER): Payer: Medicare Other | Admitting: Podiatry

## 2014-01-06 ENCOUNTER — Encounter: Payer: Self-pay | Admitting: Podiatry

## 2014-01-06 VITALS — BP 141/87 | HR 76 | Resp 17 | Ht 66.0 in | Wt 180.0 lb

## 2014-01-06 DIAGNOSIS — B351 Tinea unguium: Secondary | ICD-10-CM

## 2014-01-06 DIAGNOSIS — M79609 Pain in unspecified limb: Secondary | ICD-10-CM

## 2014-01-06 NOTE — Progress Notes (Signed)
   Subjective:    [name], female    DOB: 02/08/1946, 68 y.o.   MRN: [id number]  HPI Comments: Pt presents for debridement of Right 1 - 5 toenails and the 2nd distal toe has hard skin.     Review of Systems  All other systems reviewed and are negative.      Objective:   Physical Exam        Assessment & Plan:

## 2014-01-07 NOTE — Progress Notes (Signed)
Subjective:     [name], female   DOB: 1945-12-08, 68 y.o.   MRN: [id number]  HPI patient is found to have thick nailbeds 1-5 both feet that she cannot cut   Review of Systems     Objective:   Physical Exam Neurovascular status unchanged with thick nailbeds 1-5 both feet that are brittle yellow and crumbled with pain    Assessment:     Mycotic nail infection with pain 1-5 both feet    Plan:     Debridement painful nailbeds 1-5 both feet with no bleeding noted

## 2014-01-10 ENCOUNTER — Ambulatory Visit: Payer: Self-pay | Admitting: Podiatry

## 2014-04-04 ENCOUNTER — Ambulatory Visit: Payer: Medicare Other | Admitting: Podiatry

## 2014-04-07 ENCOUNTER — Ambulatory Visit: Payer: Medicare Other | Admitting: Podiatry

## 2014-04-18 ENCOUNTER — Ambulatory Visit: Payer: Medicare Other | Admitting: Podiatry

## 2014-04-23 ENCOUNTER — Other Ambulatory Visit (HOSPITAL_COMMUNITY): Payer: Self-pay | Admitting: Physical Medicine and Rehabilitation

## 2014-05-12 ENCOUNTER — Ambulatory Visit: Payer: Medicare Other | Admitting: Podiatry

## 2014-05-17 ENCOUNTER — Encounter: Payer: Medicare Other | Admitting: Cardiovascular Disease

## 2014-05-24 NOTE — Progress Notes (Signed)
This encounter was created in error - please disregard.

## 2014-05-31 ENCOUNTER — Other Ambulatory Visit: Payer: Self-pay | Admitting: Internal Medicine

## 2014-05-31 DIAGNOSIS — N6310 Unspecified lump in the right breast, unspecified quadrant: Secondary | ICD-10-CM

## 2014-06-07 ENCOUNTER — Ambulatory Visit
Admission: RE | Admit: 2014-06-07 | Discharge: 2014-06-07 | Disposition: A | Discharge status: Home / Self Care | Payer: BC Managed Care – PPO | Source: Ambulatory Visit | Attending: Internal Medicine | Admitting: Internal Medicine

## 2014-06-07 ENCOUNTER — Other Ambulatory Visit: Payer: Self-pay | Admitting: Internal Medicine

## 2014-06-07 DIAGNOSIS — N6310 Unspecified lump in the right breast, unspecified quadrant: Secondary | ICD-10-CM

## 2014-06-20 ENCOUNTER — Other Ambulatory Visit (HOSPITAL_COMMUNITY): Payer: Self-pay | Admitting: Physical Medicine and Rehabilitation

## 2014-06-27 ENCOUNTER — Emergency Department (HOSPITAL_COMMUNITY): Payer: Medicare Other

## 2014-06-27 ENCOUNTER — Inpatient Hospital Stay (HOSPITAL_COMMUNITY)
Admission: EM | Admit: 2014-06-27 | Discharge: 2014-07-07 | DRG: 208 | Disposition: A | Discharge status: Other Acute Inpatient Hospital | Payer: Medicare Other | Attending: Internal Medicine | Admitting: Internal Medicine

## 2014-06-27 ENCOUNTER — Encounter (HOSPITAL_COMMUNITY): Payer: Self-pay | Admitting: Emergency Medicine

## 2014-06-27 ENCOUNTER — Inpatient Hospital Stay (HOSPITAL_COMMUNITY): Payer: Medicare Other

## 2014-06-27 DIAGNOSIS — I5023 Acute on chronic systolic (congestive) heart failure: Secondary | ICD-10-CM

## 2014-06-27 DIAGNOSIS — I480 Paroxysmal atrial fibrillation: Secondary | ICD-10-CM | POA: Diagnosis present

## 2014-06-27 DIAGNOSIS — Z8249 Family history of ischemic heart disease and other diseases of the circulatory system: Secondary | ICD-10-CM | POA: Diagnosis not present

## 2014-06-27 DIAGNOSIS — Z992 Dependence on renal dialysis: Secondary | ICD-10-CM

## 2014-06-27 DIAGNOSIS — I120 Hypertensive chronic kidney disease with stage 5 chronic kidney disease or end stage renal disease: Secondary | ICD-10-CM | POA: Diagnosis present

## 2014-06-27 DIAGNOSIS — I739 Peripheral vascular disease, unspecified: Secondary | ICD-10-CM

## 2014-06-27 DIAGNOSIS — I272 Other secondary pulmonary hypertension: Secondary | ICD-10-CM | POA: Diagnosis present

## 2014-06-27 DIAGNOSIS — I252 Old myocardial infarction: Secondary | ICD-10-CM | POA: Diagnosis not present

## 2014-06-27 DIAGNOSIS — Z79899 Other long term (current) drug therapy: Secondary | ICD-10-CM

## 2014-06-27 DIAGNOSIS — Z9841 Cataract extraction status, right eye: Secondary | ICD-10-CM

## 2014-06-27 DIAGNOSIS — J189 Pneumonia, unspecified organism: Secondary | ICD-10-CM | POA: Diagnosis present

## 2014-06-27 DIAGNOSIS — Z978 Presence of other specified devices: Secondary | ICD-10-CM

## 2014-06-27 DIAGNOSIS — I2510 Atherosclerotic heart disease of native coronary artery without angina pectoris: Secondary | ICD-10-CM

## 2014-06-27 DIAGNOSIS — Z87898 Personal history of other specified conditions: Secondary | ICD-10-CM

## 2014-06-27 DIAGNOSIS — E1165 Type 2 diabetes mellitus with hyperglycemia: Secondary | ICD-10-CM | POA: Diagnosis present

## 2014-06-27 DIAGNOSIS — D631 Anemia in chronic kidney disease: Secondary | ICD-10-CM | POA: Diagnosis present

## 2014-06-27 DIAGNOSIS — E1122 Type 2 diabetes mellitus with diabetic chronic kidney disease: Secondary | ICD-10-CM

## 2014-06-27 DIAGNOSIS — I959 Hypotension, unspecified: Secondary | ICD-10-CM | POA: Diagnosis present

## 2014-06-27 DIAGNOSIS — N2581 Secondary hyperparathyroidism of renal origin: Secondary | ICD-10-CM

## 2014-06-27 DIAGNOSIS — J9600 Acute respiratory failure, unspecified whether with hypoxia or hypercapnia: Secondary | ICD-10-CM

## 2014-06-27 DIAGNOSIS — Z961 Presence of intraocular lens: Secondary | ICD-10-CM | POA: Diagnosis present

## 2014-06-27 DIAGNOSIS — E875 Hyperkalemia: Secondary | ICD-10-CM | POA: Diagnosis present

## 2014-06-27 DIAGNOSIS — J811 Chronic pulmonary edema: Secondary | ICD-10-CM

## 2014-06-27 DIAGNOSIS — I350 Nonrheumatic aortic (valve) stenosis: Secondary | ICD-10-CM

## 2014-06-27 DIAGNOSIS — Z8673 Personal history of transient ischemic attack (TIA), and cerebral infarction without residual deficits: Secondary | ICD-10-CM

## 2014-06-27 DIAGNOSIS — I5042 Chronic combined systolic (congestive) and diastolic (congestive) heart failure: Secondary | ICD-10-CM | POA: Diagnosis present

## 2014-06-27 DIAGNOSIS — I679 Cerebrovascular disease, unspecified: Secondary | ICD-10-CM | POA: Diagnosis present

## 2014-06-27 DIAGNOSIS — R532 Functional quadriplegia: Secondary | ICD-10-CM | POA: Diagnosis present

## 2014-06-27 DIAGNOSIS — I10 Essential (primary) hypertension: Secondary | ICD-10-CM | POA: Diagnosis present

## 2014-06-27 DIAGNOSIS — N189 Chronic kidney disease, unspecified: Secondary | ICD-10-CM

## 2014-06-27 DIAGNOSIS — J9601 Acute respiratory failure with hypoxia: Secondary | ICD-10-CM | POA: Diagnosis present

## 2014-06-27 DIAGNOSIS — E118 Type 2 diabetes mellitus with unspecified complications: Secondary | ICD-10-CM | POA: Diagnosis present

## 2014-06-27 DIAGNOSIS — N186 End stage renal disease: Secondary | ICD-10-CM

## 2014-06-27 DIAGNOSIS — E43 Unspecified severe protein-calorie malnutrition: Secondary | ICD-10-CM | POA: Diagnosis present

## 2014-06-27 DIAGNOSIS — I469 Cardiac arrest, cause unspecified: Secondary | ICD-10-CM

## 2014-06-27 DIAGNOSIS — I2583 Coronary atherosclerosis due to lipid rich plaque: Secondary | ICD-10-CM

## 2014-06-27 DIAGNOSIS — G9340 Encephalopathy, unspecified: Secondary | ICD-10-CM

## 2014-06-27 DIAGNOSIS — Z4659 Encounter for fitting and adjustment of other gastrointestinal appliance and device: Secondary | ICD-10-CM

## 2014-06-27 DIAGNOSIS — I080 Rheumatic disorders of both mitral and aortic valves: Secondary | ICD-10-CM

## 2014-06-27 DIAGNOSIS — Z89512 Acquired absence of left leg below knee: Secondary | ICD-10-CM | POA: Diagnosis not present

## 2014-06-27 DIAGNOSIS — D696 Thrombocytopenia, unspecified: Secondary | ICD-10-CM

## 2014-06-27 DIAGNOSIS — Y95 Nosocomial condition: Secondary | ICD-10-CM | POA: Diagnosis present

## 2014-06-27 DIAGNOSIS — E119 Type 2 diabetes mellitus without complications: Secondary | ICD-10-CM

## 2014-06-27 DIAGNOSIS — R569 Unspecified convulsions: Secondary | ICD-10-CM

## 2014-06-27 DIAGNOSIS — R531 Weakness: Secondary | ICD-10-CM

## 2014-06-27 DIAGNOSIS — R5381 Other malaise: Secondary | ICD-10-CM

## 2014-06-27 DIAGNOSIS — Z9842 Cataract extraction status, left eye: Secondary | ICD-10-CM | POA: Diagnosis not present

## 2014-06-27 DIAGNOSIS — I5032 Chronic diastolic (congestive) heart failure: Secondary | ICD-10-CM | POA: Diagnosis present

## 2014-06-27 DIAGNOSIS — R001 Bradycardia, unspecified: Secondary | ICD-10-CM

## 2014-06-27 DIAGNOSIS — I82890 Acute embolism and thrombosis of other specified veins: Secondary | ICD-10-CM

## 2014-06-27 LAB — APTT
APTT: 34 s (ref 24–37)
aPTT: 36 seconds (ref 24–37)

## 2014-06-27 LAB — POCT I-STAT, CHEM 8
BUN: 105 mg/dL — ABNORMAL HIGH (ref 6–23)
BUN: 52 mg/dL — ABNORMAL HIGH (ref 6–23)
BUN: 31 mg/dL — ABNORMAL HIGH (ref 6–23)
CALCIUM ION: 1.06 mmol/L — AB (ref 1.13–1.30)
CALCIUM ION: 1.12 mmol/L — AB (ref 1.13–1.30)
CHLORIDE: 106 meq/L (ref 96–112)
CHLORIDE: 102 meq/L (ref 96–112)
Calcium, Ion: 0.9 mmol/L — ABNORMAL LOW (ref 1.13–1.30)
Chloride: 104 mEq/L (ref 96–112)
Creatinine, Ser: 10.7 mg/dL — ABNORMAL HIGH (ref 0.50–1.10)
Creatinine, Ser: 6.8 mg/dL — ABNORMAL HIGH (ref 0.50–1.10)
Creatinine, Ser: 4.1 mg/dL — ABNORMAL HIGH (ref 0.50–1.10)
GLUCOSE: 222 mg/dL — AB (ref 70–99)
GLUCOSE: 157 mg/dL — AB (ref 70–99)
Glucose, Bld: 105 mg/dL — ABNORMAL HIGH (ref 70–99)
HEMATOCRIT: 39 % (ref 36.0–46.0)
HEMATOCRIT: 39 % (ref 36.0–46.0)
HEMATOCRIT: 40 % (ref 36.0–46.0)
Hemoglobin: 13.3 g/dL (ref 12.0–15.0)
Hemoglobin: 13.3 g/dL (ref 12.0–15.0)
Hemoglobin: 13.6 g/dL (ref 12.0–15.0)
POTASSIUM: 6.7 meq/L — AB (ref 3.7–5.3)
Potassium: 4.3 mEq/L (ref 3.7–5.3)
Potassium: 3.7 mEq/L (ref 3.7–5.3)
Sodium: 134 mEq/L — ABNORMAL LOW (ref 137–147)
Sodium: 139 mEq/L (ref 137–147)
Sodium: 141 mEq/L (ref 137–147)
TCO2: 19 mmol/L (ref 0–100)
TCO2: 22 mmol/L (ref 0–100)
TCO2: 26 mmol/L (ref 0–100)

## 2014-06-27 LAB — BASIC METABOLIC PANEL
Anion gap: 28 — ABNORMAL HIGH (ref 5–15)
Anion gap: 18 — ABNORMAL HIGH (ref 5–15)
Anion gap: 21 — ABNORMAL HIGH (ref 5–15)
BUN: 93 mg/dL — ABNORMAL HIGH (ref 6–23)
BUN: 28 mg/dL — ABNORMAL HIGH (ref 6–23)
BUN: 39 mg/dL — ABNORMAL HIGH (ref 6–23)
CALCIUM: 8.9 mg/dL (ref 8.4–10.5)
CALCIUM: 9.4 mg/dL (ref 8.4–10.5)
CO2: 16 mEq/L — ABNORMAL LOW (ref 19–32)
CO2: 26 mEq/L (ref 19–32)
CO2: 23 mEq/L (ref 19–32)
Calcium: 9.3 mg/dL (ref 8.4–10.5)
Chloride: 96 mEq/L (ref 96–112)
Chloride: 95 mEq/L — ABNORMAL LOW (ref 96–112)
Chloride: 95 mEq/L — ABNORMAL LOW (ref 96–112)
Creatinine, Ser: 10.44 mg/dL — ABNORMAL HIGH (ref 0.50–1.10)
Creatinine, Ser: 3.58 mg/dL — ABNORMAL HIGH (ref 0.50–1.10)
Creatinine, Ser: 5.17 mg/dL — ABNORMAL HIGH (ref 0.50–1.10)
GFR calc Af Amer: 14 mL/min — ABNORMAL LOW (ref >90)
GFR calc non Af Amer: 3 mL/min — ABNORMAL LOW (ref >90)
GFR, EST AFRICAN AMERICAN: 4 mL/min — AB (ref >90)
GFR, EST AFRICAN AMERICAN: 9 mL/min — AB (ref >90)
GFR, EST NON AFRICAN AMERICAN: 12 mL/min — AB (ref >90)
GFR, EST NON AFRICAN AMERICAN: 8 mL/min — AB (ref >90)
GLUCOSE: 224 mg/dL — AB (ref 70–99)
GLUCOSE: 97 mg/dL (ref 70–99)
Glucose, Bld: 116 mg/dL — ABNORMAL HIGH (ref 70–99)
POTASSIUM: 6.4 meq/L — AB (ref 3.7–5.3)
POTASSIUM: 4.3 meq/L (ref 3.7–5.3)
POTASSIUM: 4.7 meq/L (ref 3.7–5.3)
SODIUM: 140 meq/L (ref 137–147)
SODIUM: 139 meq/L (ref 137–147)
Sodium: 139 mEq/L (ref 137–147)

## 2014-06-27 LAB — I-STAT CHEM 8, ED
BUN: 100 mg/dL — ABNORMAL HIGH (ref 6–23)
CHLORIDE: 104 meq/L (ref 96–112)
Calcium, Ion: 1.02 mmol/L — ABNORMAL LOW (ref 1.13–1.30)
Creatinine, Ser: 11.3 mg/dL — ABNORMAL HIGH (ref 0.50–1.10)
GLUCOSE: 194 mg/dL — AB (ref 70–99)
HEMATOCRIT: 40 % (ref 36.0–46.0)
Hemoglobin: 13.6 g/dL (ref 12.0–15.0)
POTASSIUM: 6.3 meq/L — AB (ref 3.7–5.3)
SODIUM: 133 meq/L — AB (ref 137–147)
TCO2: 18 mmol/L (ref 0–100)

## 2014-06-27 LAB — COMPREHENSIVE METABOLIC PANEL
ALBUMIN: 3.8 g/dL (ref 3.5–5.2)
ALT: 44 U/L — ABNORMAL HIGH (ref 0–35)
AST: 49 U/L — ABNORMAL HIGH (ref 0–37)
Alkaline Phosphatase: 247 U/L — ABNORMAL HIGH (ref 39–117)
Anion gap: 26 — ABNORMAL HIGH (ref 5–15)
BUN: 93 mg/dL — ABNORMAL HIGH (ref 6–23)
CALCIUM: 9.4 mg/dL (ref 8.4–10.5)
CO2: 17 meq/L — AB (ref 19–32)
CREATININE: 10.75 mg/dL — AB (ref 0.50–1.10)
Chloride: 92 mEq/L — ABNORMAL LOW (ref 96–112)
GFR calc Af Amer: 4 mL/min — ABNORMAL LOW (ref >90)
GFR, EST NON AFRICAN AMERICAN: 3 mL/min — AB (ref >90)
Glucose, Bld: 191 mg/dL — ABNORMAL HIGH (ref 70–99)
Potassium: 6.5 mEq/L (ref 3.7–5.3)
Sodium: 135 mEq/L — ABNORMAL LOW (ref 137–147)
Total Bilirubin: 0.5 mg/dL (ref 0.3–1.2)
Total Protein: 8.2 g/dL (ref 6.0–8.3)

## 2014-06-27 LAB — CBC WITH DIFFERENTIAL/PLATELET
BASOS ABS: 0 10*3/uL (ref 0.0–0.1)
BASOS PCT: 1 % (ref 0–1)
Eosinophils Absolute: 0.3 10*3/uL (ref 0.0–0.7)
Eosinophils Relative: 4 % (ref 0–5)
HCT: 36.1 % (ref 36.0–46.0)
Hemoglobin: 12 g/dL (ref 12.0–15.0)
Lymphocytes Relative: 25 % (ref 12–46)
Lymphs Abs: 1.9 10*3/uL (ref 0.7–4.0)
MCH: 31.2 pg (ref 26.0–34.0)
MCHC: 33.2 g/dL (ref 30.0–36.0)
MCV: 93.8 fL (ref 78.0–100.0)
Monocytes Absolute: 0.4 10*3/uL (ref 0.1–1.0)
Monocytes Relative: 5 % (ref 3–12)
Neutro Abs: 5 10*3/uL (ref 1.7–7.7)
Neutrophils Relative %: 65 % (ref 43–77)
Platelets: 174 10*3/uL (ref 150–400)
RBC: 3.85 MIL/uL — ABNORMAL LOW (ref 3.87–5.11)
RDW: 15 % (ref 11.5–15.5)
WBC: 7.5 10*3/uL (ref 4.0–10.5)

## 2014-06-27 LAB — I-STAT ARTERIAL BLOOD GAS, ED
ACID-BASE DEFICIT: 7 mmol/L — AB (ref 0.0–2.0)
Acid-base deficit: 7 mmol/L — ABNORMAL HIGH (ref 0.0–2.0)
BICARBONATE: 19.1 meq/L — AB (ref 20.0–24.0)
Bicarbonate: 19.6 mEq/L — ABNORMAL LOW (ref 20.0–24.0)
O2 SAT: 99 %
O2 Saturation: 100 %
PCO2 ART: 42.1 mmHg (ref 35.0–45.0)
PCO2 ART: 38.5 mmHg (ref 35.0–45.0)
PH ART: 7.275 — AB (ref 7.350–7.450)
TCO2: 21 mmol/L (ref 0–100)
TCO2: 20 mmol/L (ref 0–100)
pH, Arterial: 7.304 — ABNORMAL LOW (ref 7.350–7.450)
pO2, Arterial: 387 mmHg — ABNORMAL HIGH (ref 80.0–100.0)
pO2, Arterial: 129 mmHg — ABNORMAL HIGH (ref 80.0–100.0)

## 2014-06-27 LAB — URINALYSIS, ROUTINE W REFLEX MICROSCOPIC
Bilirubin Urine: NEGATIVE
Glucose, UA: 250 mg/dL — AB
Ketones, ur: 15 mg/dL — AB
Leukocytes, UA: NEGATIVE
Nitrite: NEGATIVE
Specific Gravity, Urine: 1.013 (ref 1.005–1.030)
UROBILINOGEN UA: 0.2 mg/dL (ref 0.0–1.0)
pH: 8 (ref 5.0–8.0)

## 2014-06-27 LAB — GLUCOSE, CAPILLARY
GLUCOSE-CAPILLARY: 111 mg/dL — AB (ref 70–99)
Glucose-Capillary: 201 mg/dL — ABNORMAL HIGH (ref 70–99)

## 2014-06-27 LAB — PROTIME-INR
INR: 1.31 (ref 0.00–1.49)
INR: 1.18 (ref 0.00–1.49)
PROTHROMBIN TIME: 16.5 s — AB (ref 11.6–15.2)
Prothrombin Time: 15.1 seconds (ref 11.6–15.2)

## 2014-06-27 LAB — URINE MICROSCOPIC-ADD ON

## 2014-06-27 LAB — TROPONIN I

## 2014-06-27 LAB — CBG MONITORING, ED: Glucose-Capillary: 199 mg/dL — ABNORMAL HIGH (ref 70–99)

## 2014-06-27 LAB — MRSA PCR SCREENING: MRSA BY PCR: NEGATIVE

## 2014-06-27 MED ORDER — DEXTROSE 5 % IV SOLN
0.5000 ug/min | INTRAVENOUS | Status: DC
  Filled 2014-06-27: qty 4

## 2014-06-27 MED ORDER — LABETALOL HCL 5 MG/ML IV SOLN
20.0000 mg | INTRAVENOUS | Status: DC | PRN
  Administered 2014-06-27 (×2): 20 mg via INTRAVENOUS
  Filled 2014-06-27 (×2): qty 4

## 2014-06-27 MED ORDER — HEPARIN SODIUM (PORCINE) 5000 UNIT/ML IJ SOLN
5000.0000 [IU] | Freq: Three times a day (TID) | INTRAMUSCULAR | Status: DC
  Administered 2014-06-27 – 2014-07-03 (×18): 5000 [IU] via SUBCUTANEOUS
  Filled 2014-06-27 (×21): qty 1

## 2014-06-27 MED ORDER — MIDAZOLAM HCL 5 MG/ML IJ SOLN: 2.0000 mg | Freq: Once | INTRAMUSCULAR | Status: AC | PRN

## 2014-06-27 MED ORDER — MIDAZOLAM BOLUS VIA INFUSION: 2.0000 mg | INTRAVENOUS | Status: DC | PRN

## 2014-06-27 MED ORDER — ROCURONIUM BROMIDE 50 MG/5ML IV SOLN
100.0000 mg | Freq: Once | INTRAVENOUS | Status: AC
  Administered 2014-06-27: 100 mg via INTRAVENOUS

## 2014-06-27 MED ORDER — MIDAZOLAM HCL 5 MG/ML IJ SOLN: 2.0000 mg | Freq: Once | INTRAMUSCULAR | Status: DC

## 2014-06-27 MED ORDER — SODIUM CHLORIDE 0.9 % IJ SOLN
10.0000 mL | Freq: Two times a day (BID) | INTRAMUSCULAR | Status: DC
  Administered 2014-06-28: 40 mL
  Administered 2014-06-29 – 2014-06-30 (×3): 10 mL

## 2014-06-27 MED ORDER — SODIUM CHLORIDE 0.9 % IV SOLN: 25.0000 ug/h | INTRAVENOUS | Status: DC

## 2014-06-27 MED ORDER — CETYLPYRIDINIUM CHLORIDE 0.05 % MT LIQD
7.0000 mL | Freq: Four times a day (QID) | OROMUCOSAL | Status: DC
  Administered 2014-06-28 – 2014-06-30 (×10): 7 mL via OROMUCOSAL

## 2014-06-27 MED ORDER — FENTANYL BOLUS VIA INFUSION: 50.0000 ug | INTRAVENOUS | Status: DC | PRN

## 2014-06-27 MED ORDER — MIDAZOLAM BOLUS VIA INFUSION
2.0000 mg | INTRAVENOUS | Status: DC | PRN
  Administered 2014-06-28: 2 mg via INTRAVENOUS
  Filled 2014-06-27: qty 2

## 2014-06-27 MED ORDER — FENTANYL CITRATE 0.05 MG/ML IJ SOLN: 25.0000 ug | INTRAMUSCULAR | Status: DC | PRN

## 2014-06-27 MED ORDER — FENTANYL CITRATE 0.05 MG/ML IJ SOLN: 100.0000 ug | Freq: Once | INTRAMUSCULAR | Status: DC

## 2014-06-27 MED ORDER — MIDAZOLAM HCL 2 MG/2ML IJ SOLN
4.0000 mg | INTRAMUSCULAR | Status: DC | PRN
Start: 2014-06-27 — End: 2014-06-27

## 2014-06-27 MED ORDER — FENTANYL BOLUS VIA INFUSION
50.0000 ug | INTRAVENOUS | Status: DC | PRN
  Filled 2014-06-27: qty 50

## 2014-06-27 MED ORDER — PANTOPRAZOLE SODIUM 40 MG IV SOLR
40.0000 mg | Freq: Every day | INTRAVENOUS | Status: DC
  Administered 2014-06-27 – 2014-06-30 (×4): 40 mg via INTRAVENOUS
  Filled 2014-06-27 (×5): qty 40

## 2014-06-27 MED ORDER — CISATRACURIUM BOLUS VIA INFUSION: 0.1000 mg/kg | Freq: Once | INTRAVENOUS | Status: DC

## 2014-06-27 MED ORDER — CISATRACURIUM BOLUS VIA INFUSION
0.1000 mg/kg | Freq: Once | INTRAVENOUS | Status: AC
  Administered 2014-06-27: 8.2 mg via INTRAVENOUS
  Filled 2014-06-27: qty 9

## 2014-06-27 MED ORDER — SODIUM CHLORIDE 0.9 % IV SOLN
1.0000 ug/kg/min | INTRAVENOUS | Status: DC
  Administered 2014-06-27: 1.001 ug/kg/min via INTRAVENOUS
  Administered 2014-06-28: 1 ug/kg/min via INTRAVENOUS
  Filled 2014-06-27 (×2): qty 20

## 2014-06-27 MED ORDER — ALBUTEROL SULFATE (2.5 MG/3ML) 0.083% IN NEBU
2.5000 mg | INHALATION_SOLUTION | RESPIRATORY_TRACT | Status: DC
  Administered 2014-06-27 – 2014-07-01 (×21): 2.5 mg via RESPIRATORY_TRACT
  Filled 2014-06-27 (×22): qty 3

## 2014-06-27 MED ORDER — CISATRACURIUM BOLUS VIA INFUSION
0.0500 mg/kg | INTRAVENOUS | Status: DC | PRN
  Filled 2014-06-27: qty 5

## 2014-06-27 MED ORDER — SODIUM CHLORIDE 0.9 % IV SOLN
1.0000 mg/h | INTRAVENOUS | Status: DC
  Administered 2014-06-27 (×2): 2 mg/h via INTRAVENOUS
  Administered 2014-06-28: 3 mg/h via INTRAVENOUS
  Administered 2014-06-29: 5 mg/h via INTRAVENOUS
  Filled 2014-06-27 (×4): qty 10

## 2014-06-27 MED ORDER — SODIUM CHLORIDE 0.9 % IV SOLN: 1.0000 ug/kg/min | INTRAVENOUS | Status: DC

## 2014-06-27 MED ORDER — FENTANYL CITRATE 0.05 MG/ML IJ SOLN
25.0000 ug/h | INTRAMUSCULAR | Status: DC
  Administered 2014-06-27: 75 ug/h via INTRAVENOUS
  Administered 2014-06-27 – 2014-06-28 (×2): 50 ug/h via INTRAVENOUS
  Administered 2014-06-28 (×2): 200 ug/h via INTRAVENOUS
  Filled 2014-06-27 (×3): qty 50

## 2014-06-27 MED ORDER — SODIUM CHLORIDE 0.9 % IV SOLN: 1.0000 mg/h | INTRAVENOUS | Status: DC

## 2014-06-27 MED ORDER — HYDRALAZINE HCL 20 MG/ML IJ SOLN
5.0000 mg | Freq: Once | INTRAMUSCULAR | Status: AC
  Administered 2014-06-27: 5 mg via INTRAVENOUS
  Filled 2014-06-27: qty 1

## 2014-06-27 MED ORDER — ASPIRIN 300 MG RE SUPP
300.0000 mg | RECTAL | Status: AC
  Administered 2014-06-27: 300 mg via RECTAL
  Filled 2014-06-27: qty 1

## 2014-06-27 MED ORDER — PREDNISOLONE ACETATE 1 % OP SUSP
1.0000 [drp] | Freq: Three times a day (TID) | OPHTHALMIC | Status: DC
  Administered 2014-06-27 – 2014-07-07 (×30): 1 [drp] via OPHTHALMIC
  Filled 2014-06-27 (×5): qty 1

## 2014-06-27 MED ORDER — INSULIN ASPART 100 UNIT/ML ~~LOC~~ SOLN
1.0000 [IU] | SUBCUTANEOUS | Status: DC
  Administered 2014-06-27: 2 [IU] via SUBCUTANEOUS
  Administered 2014-06-27: 3 [IU] via SUBCUTANEOUS
  Administered 2014-06-28 (×4): 1 [IU] via SUBCUTANEOUS
  Administered 2014-06-29: 2 [IU] via SUBCUTANEOUS
  Administered 2014-06-29 – 2014-06-30 (×2): 1 [IU] via SUBCUTANEOUS
  Administered 2014-06-30: 2 [IU] via SUBCUTANEOUS
  Administered 2014-06-30 (×3): 1 [IU] via SUBCUTANEOUS
  Administered 2014-06-30 – 2014-07-01 (×3): 2 [IU] via SUBCUTANEOUS
  Administered 2014-07-01: 1 [IU] via SUBCUTANEOUS

## 2014-06-27 MED ORDER — SODIUM CHLORIDE 0.9 % IJ SOLN: 10.0000 mL | INTRAMUSCULAR | Status: DC | PRN

## 2014-06-27 MED ORDER — ARTIFICIAL TEARS OP OINT
1.0000 "application " | TOPICAL_OINTMENT | Freq: Three times a day (TID) | OPHTHALMIC | Status: DC
  Administered 2014-06-27 – 2014-06-28 (×5): 1 via OPHTHALMIC
  Filled 2014-06-27: qty 3.5

## 2014-06-27 MED ORDER — FENTANYL CITRATE 0.05 MG/ML IJ SOLN: 100.0000 ug | Freq: Once | INTRAMUSCULAR | Status: AC | PRN

## 2014-06-27 MED ORDER — CHLORHEXIDINE GLUCONATE 0.12 % MT SOLN
15.0000 mL | Freq: Two times a day (BID) | OROMUCOSAL | Status: DC
  Administered 2014-06-27 – 2014-06-29 (×6): 15 mL via OROMUCOSAL
  Filled 2014-06-27 (×6): qty 15

## 2014-06-27 MED ORDER — SODIUM CHLORIDE 0.9 % IV SOLN
2000.0000 mL | Freq: Once | INTRAVENOUS | Status: AC
  Administered 2014-06-27: 2000 mL via INTRAVENOUS

## 2014-06-27 MED ORDER — CISATRACURIUM BOLUS VIA INFUSION: 0.0500 mg/kg | INTRAVENOUS | Status: DC | PRN

## 2014-06-27 NOTE — Procedures (Signed)
Central Venous Catheter Insertion Procedure Note Briana Gould [id number] [birth year]  Procedure: Insertion of Central Venous Catheter Indications: Assessment of intravascular volume, Drug and/or fluid administration and Frequent blood sampling  Procedure Details Consent: Risks of procedure as well as the alternatives and risks of each were explained to the (patient/caregiver).  Consent for procedure obtained. Time Out: Verified patient identification, verified procedure, site/side was marked, verified correct patient position, special equipment/implants available, medications/allergies/relevent history reviewed, required imaging and test results available.  Performed  Maximum sterile technique was used including antiseptics, cap, gloves, gown, hand hygiene, mask and sheet. Skin prep: Chlorhexidine; local anesthetic administered A antimicrobial bonded/coated triple lumen catheter was placed in the left femoral vein due to patient being a dialysis patient using the Seldinger technique.  Evaluation Blood flow good Complications: No apparent complications Patient did tolerate procedure well. Chest X-ray ordered to verify placement.  CXR: not required  BABCOCK,PETE 06/27/2014, 1:34 PM  Ultrasound used for site verification, live visualisation of needle entry & guidewire prior to dilation Supervised by me  Oretha MilchALVA,RAKESH V. MD

## 2014-06-27 NOTE — Progress Notes (Signed)
Chaplain responded to page from ED. Chaplain made her presence known to family. Family minister present. Chaplain offered to move family to the consult room, but family preferred to stay in ED waiting. Family asked chaplain to continue to check in.  06/27/14 1200  Clinical Encounter Type  Visited With Family  Visit Type Initial;Critical Care  Referral From Nurse  Jiles HaroldStamey, Miquel Lamson F, Chaplain 06/27/2014 12:49 PM

## 2014-06-27 NOTE — ED Notes (Signed)
Patient transported to CT, Hope, RN accompanied pt to CT scan with RT

## 2014-06-27 NOTE — ED Notes (Signed)
Pt unresponsive. Dr. Rosalia Hammersay at bedside prepared to intubate. Rocuronium given.

## 2014-06-27 NOTE — ED Notes (Signed)
Pt in surgery for dialysis graft placement. Pt received 25mcg fentanyl and unknown dose of versed. Pt became unresponsive- apneic, started CPR in surgery. She received 0.4 narcan by staff in surgery. 2 minutes of CPR, gained pulses. EMS gave 2mg  narcan, placed nasal trumpet, bag masked pt. Pt unresponsive even to pain. Prior to procedure pt was alert/oriented

## 2014-06-27 NOTE — Procedures (Signed)
Attempted left fem aline. Not able to thread guidewire.   Anders SimmondsPete Babcock ACNP-BC [facility] Pager # [phone] OR # [phone] if no answer

## 2014-06-27 NOTE — ED Notes (Signed)
Critical care at bedside placing left femoral central line and A line.

## 2014-06-27 NOTE — H&P (Signed)
PULMONARY / CRITICAL CARE MEDICINE   Name: Briana Gould MRN: [id number] DOB: April 27, 1946    ADMISSION DATE:  06/27/2014 CONSULTATION DATE:  10/19  REFERRING MD :  Rosalia Hammers   CHIEF COMPLAINT:  Cardiac arrest   INITIAL PRESENTATION:  69 year old female admitted on 10/19 s/p cardiopulmonary arrest after placement of tunneled HD cath in out-pt surgical center. Pt had hematoma involving her left AV fistula making HD access difficult. No HD since 10/14. Per surgical center pt not on monitor. Noted to not be breathing ~ 5 minutes s/p procedure. Oral airway placed. Assisted ventilation initiated. No pulses noted. Got CPR x 2 minutes then ROSC noted. Presented to the ER unresponsive w/ ineffective ventilation and posturing. PCCM asked to admit.   STUDIES:  CT head 10/19>>>  SIGNIFICANT EVENTS: Echo 10/19>>>   HISTORY OF PRESENT ILLNESS:   68 year old female transferred to [facility] ER after respiratory arrest at outpatient surgical Center. Patient had tunneled left IJ catheter placed at outpatient facility. For procedure she received 1 mg of Versed and 25 mcg of fentanyl. Per her physician, Dr. Juel Burrow, reports patient tolerated procedure well. Approximately 5 minutes after the procedure she had a respiratory arrest. He stated that they placed an oral airway and assisted her in breathing. She was not on a monitor at the time. They noted that she did not have pulses and proceeded to do compressions for 2 minutes. After that she had return of circulation. She received Romazicon and Narcan. She is breathing on her own. EMS reports sinus tachycardia and no arrhythmia seen. EMS was assisting ventilations and route. Patient not responsive and route via EMS or on presentation here. She has some breathing per EMS. She is having an IJ catheter placed secondary to hematoma involving her AV fistula. On arrival to ED she was unresponsive and not effectively ventilating. Per EDP she was decorticate posturing. PCCM has  been asked to admit s/p arrest.    PAST MEDICAL HISTORY :   has a past medical history of PAD (peripheral artery disease); Type II diabetes mellitus; Anemia; History of blood transfusion (2002); End stage renal disease on dialysis; DVT (deep venous thrombosis); CVA (cerebral infarction); Pulmonary HTN; Thrombocytopenia; CAD (coronary artery disease); Aortic stenosis; Syncope; Carotid artery disease; Cerebrovascular disease; Cardiac arrest (08/14/13); End stage renal disease on dialysis; Peripheral arterial occlusive disease; Hypertension; Diabetes mellitus type 2, uncontrolled, with complications; DVT (deep venous thrombosis); CAD (coronary artery disease), native coronary artery (2011); MI (myocardial infarction); and Stroke.  has past surgical history that includes AV fistula placement (Left, 2011); Below knee leg amputation (Left, 2002); Tonsillectomy; Cataract extraction w/ intraocular lens  implant, bilateral (Bilateral, 2012-2014); Esophagogastroduodenoscopy (N/A, 08/30/2013); Below knee leg amputation (Left); and AV fistula placement (Left). Prior to Admission medications   Medication Sig Start Date End Date Taking? Authorizing Provider  albuterol (PROVENTIL) (2.5 MG/3ML) 0.083% nebulizer solution Take 3 mLs (2.5 mg total) by nebulization every 4 (four) hours as needed for wheezing. 12/17/13   Calvert Cantor, MD  b complex-vitamin c-folic acid (NEPHRO-VITE) 0.8 MG TABS tablet Take 1 tablet by mouth daily.    Historical Provider, MD  dextromethorphan (DELSYM) 30 MG/5ML liquid Take 5 mLs (30 mg total) by mouth 2 (two) times daily. 12/17/13   Calvert Cantor, MD  famotidine (PEPCID) 20 MG tablet Take 1 tablet (20 mg total) by mouth at bedtime. 10/25/13   Evlyn Kanner Love, PA-C  fluticasone (FLOVENT HFA) 110 MCG/ACT inhaler Inhale 2 puffs into the lungs 2 (two) times daily.  12/17/13   Calvert CantorSaima Rizwan, MD  glimepiride (AMARYL) 1 MG tablet Take 1 tablet (1 mg total) by mouth daily with breakfast. 10/25/13   Jacquelynn CreePamela S  Love, PA-C  guaiFENesin (MUCINEX) 600 MG 12 hr tablet Take 1 tablet (600 mg total) by mouth 2 (two) times daily. 12/17/13   Calvert CantorSaima Rizwan, MD  levofloxacin (LEVAQUIN) 250 MG tablet Take 1 tablet (250 mg total) by mouth every other day. 12/19/13   Calvert CantorSaima Rizwan, MD  midodrine (PROAMATINE) 5 MG tablet Take 1 tablet (5 mg total) by mouth every Monday, Wednesday, and Friday with hemodialysis. 10/25/13   Jacquelynn CreePamela S Love, PA-C  Nutritional Supplements (FEEDING SUPPLEMENT, NEPRO CARB STEADY,) LIQD Take 237 mLs by mouth daily.    Historical Provider, MD  pantoprazole (PROTONIX) 40 MG tablet Take 1 tablet (40 mg total) by mouth 2 (two) times daily. 10/25/13   Jacquelynn CreePamela S Love, PA-C  prednisoLONE acetate (PRED FORTE) 1 % ophthalmic suspension Place 1 drop into both eyes 3 (three) times daily. 12/17/13   Calvert CantorSaima Rizwan, MD  sevelamer carbonate (RENVELA) 800 MG tablet Take 1,600 mg by mouth 3 (three) times daily with meals.     Historical Provider, MD   Allergies  Allergen Reactions  . Phenergan [Promethazine Hcl] Other (See Comments)    Causes patient to" vomit more"  . Vitamin D Analogs Rash    FAMILY HISTORY:  has no family status information on file.  SOCIAL HISTORY:  reports that she has never smoked. She does not have any smokeless tobacco history on file. She reports that she does not drink alcohol or use illicit drugs.  REVIEW OF SYSTEMS:  Unable   SUBJECTIVE:  Unresponsive on vent  VITAL SIGNS: Pulse Rate:  [96-109] 96 (10/19 1215) Resp:  [16-19] 18 (10/19 1215) BP: (205-224)/(86-104) 205/86 mmHg (10/19 1215) SpO2:  [100 %] 100 % (10/19 1215) FiO2 (%):  [50 %] 50 % (10/19 1105) HEMODYNAMICS:   VENTILATOR SETTINGS: Vent Mode:  [-] PRVC FiO2 (%):  [50 %] 50 % Set Rate:  [18 bmp] 18 bmp Vt Set:  [470 mL] 470 mL PEEP:  [5 cmH20] 5 cmH20 Plateau Pressure:  [19 cmH20] 19 cmH20 INTAKE / OUTPUT: No intake or output data in the 24 hours ending 06/27/14 1232  PHYSICAL EXAMINATION: General:   Unresponsive on the vent  Neuro:  GCS 3.  HEENT:  [place], + JVD Cardiovascular:  rrr Lungs:  Crackles bases  Abdomen:  Soft, non-tender  Musculoskeletal:  Intact  Skin:  Small dime sized non-stagable ulcer ant RLE, left AKA   LABS:  CBC  Recent Labs Lab 06/27/14 1125 06/27/14 1136  WBC 7.5  --   HGB 12.0 13.6  HCT 36.1 40.0  PLT 174  --    Coag's No results found for this basename: APTT, INR,  in the last 168 hours BMET  Recent Labs Lab 06/27/14 1136  NA 133*  K 6.3*  CL 104  BUN 100*  CREATININE 11.30*  GLUCOSE 194*   Electrolytes No results found for this basename: CALCIUM, MG, PHOS,  in the last 168 hours Sepsis Markers No results found for this basename: LATICACIDVEN, PROCALCITON, O2SATVEN,  in the last 168 hours ABG  Recent Labs Lab 06/27/14 1127  PHART 7.275*  PCO2ART 42.1  PO2ART 387.0*   Liver Enzymes No results found for this basename: AST, ALT, ALKPHOS, BILITOT, ALBUMIN,  in the last 168 hours Cardiac Enzymes  Recent Labs Lab 06/27/14 1125  TROPONINI <0.30   Glucose  Recent  Labs Lab 06/27/14 1212  GLUCAP 199*    Imaging No results found.   ASSESSMENT / PLAN:  PULMONARY OETT 10/19 A:  acute hypoxic respiratory failure s/p cardiac arrest  P:   Full vent support NMB and IV sedation  F/u abg  F/u PCXR  CARDIOVASCULAR CVL Left IJ tunneled HD cath 10/19 A:  Cardiopulmonary arrest. Suspect mediated by respiratory failure.  HTN Pulmonary edema  afib  H/o diastolic HF , pul hypertension -RVSP 69 on echo 09/2013 Severe known CAD: She is not a candidate for PCI because of diffuse severe disease and inability to take antiplatelet medications in the setting of severe recurrent anemia (Per Dr Shirlee LatchMcLean 12/2013)  P:  PRN labetolol  Volume removal w/ HD  RENAL A:   ESRD. Has not had HD since 10/14 Hyperkalemia   P:   Renal consulted  For emergent HD today  F/u chemistry per Hypothermia protocol  Volume removal    GASTROINTESTINAL A:   No acute  P:   NPO  PPI for SUP   HEMATOLOGIC A:  No acute  P:  [place] heparin  Serial CBC   INFECTIOUS A:  No acute  P:   Trend WBC curve   ENDOCRINE A:   DM  P:   ssi   NEUROLOGIC A:   Acute encephalopathy s/p arrest -posturing on ED arrival  H/o CVAs  P:   RASS goal: -4 Hypothermia protocol    Family Updates: daughter [name] updated at bedside re: plan of care.    Interdisciplinary Family Meetings:    TODAY'S SUMMARY:  S/p cardiopulmonary arrest. Down time unclear as was time she was not on monitor. Will go ahead and start hypothermia protocol   I have personally obtained a history by reviewing chart & speaking to ED physician, examined the patient, evaluated laboratory and imaging results, formulated the assessment and plan and placed orders. Discussed care plan with daughter who desires aggressive care at this point. Given posturing noted in ED, will proceed with hypothermia. Also d/w Dr Arlean Hoppingschertz regarding emergent HD. She will be a difficult access & will likely place femoral CVL   CRITICAL CARE: The patient is critically ill with multiple organ systems failure and requires high complexity decision making for assessment and support, frequent evaluation and titration of therapies, application of advanced monitoring technologies and extensive interpretation of multiple databases. Critical Care Time devoted to patient care services described in this note is 60 minutes.   Care during the described time interval was provided by me and/or other providers on the critical care team.  I have reviewed this patient's available data, including medical history, events of note, physical examination and test results as part of my evaluation  Cyril Mourningakesh Alva MD. FCCP. [place] [facility] Pager [phone] If no response call [phone]     06/27/2014, 12:32 PM

## 2014-06-27 NOTE — ED Notes (Signed)
Cooling pads in place- cooling has not started.

## 2014-06-27 NOTE — ED Notes (Signed)
Checked patient blood sugar it as 199 notified RN Hope of blood sugar

## 2014-06-27 NOTE — Procedures (Signed)
I was present at this dialysis session, have reviewed the session itself and made  appropriate changes  Vinson Moselleob Marleena Shubert MD (pgr) [phone]    [phone] 06/27/2014, 4:42 PM

## 2014-06-27 NOTE — Progress Notes (Signed)
Chaplain returned to ED when paged that pt was being transferred. Chaplain walked family friends to [facility] waiting area. Chaplain then offered prayer. Will follow as needed.   06/27/14 1400  Clinical Encounter Type  Visited With Family  Visit Type Follow-up  Spiritual Encounters  Spiritual Needs Prayer  Jiles HaroldStamey, Unique Sillas F, Chaplain 06/27/2014 2:11 PM

## 2014-06-27 NOTE — ED Notes (Signed)
OG tube placed  

## 2014-06-27 NOTE — ED Provider Notes (Signed)
CSN: [id number]     Arrival date & time 06/27/14  1101 History   First MD Initiated Contact with Patient 06/27/14 1112     Chief Complaint  Patient presents with  . Cardiac Arrest     (Consider location/radiation/quality/duration/timing/severity/associated sxs/prior Treatment) HPI 68 year old female transferred here after respiratory arrest at outpatient surgical Center. Patient had tunneled left IJ catheter placed at outpatient facility. For procedure she received 1 mg of Versed and 25 mcg of fentanyl. Per her physician, Dr. Juel BurrowLin, reports patient tolerated procedure well. Approximately 5 minutes after the procedure she had a respiratory arrest. He states that they placed an oral airway and assisted her in breathing. She was not on a monitor at the time. They noted that she did not have pulses and proceeded to do compressions for 2 minutes. After that she had return of circulation. She received Romazicon and Narcan. She is breathing on her own. EMS reports sinus tachycardia and no arrhythmia seen. EMS was assisting ventilations and route. Patient not responsive and route via EMS or on presentation here. She has some breathing per EMS. She is having an IJ catheter placed secondary to clot of her AV fistula. Past Medical History  Diagnosis Date  . PAD (peripheral artery disease)     a. L BKA  . Type II diabetes mellitus   . Anemia   . History of blood transfusion 2002    "related to amputation" (08/13/2013)  . End stage renal disease on dialysis     [facility]" (08/13/2013)  . DVT (deep venous thrombosis)     a. R IJ DVT 08/2013.  Marland Kitchen. CVA (cerebral infarction)     a. MRI 08/2013: possible left motor strip infarct with remote pontine infarct.  . Pulmonary HTN     a. Cath 2011 - mod to severe pulmonary hypertension. b. Echo 08/2013: PAP 45mmHg (mild-mod increased).  . Thrombocytopenia   . CAD (coronary artery disease)     a. Severe diffuse diabetic disease by cath 2011,  for med rx, not a candidate for CABG or PCI at that time.  . Aortic stenosis     mild  . Syncope     a. 2011, etiology never clear.  . Carotid artery disease     a. Dopp 82/956212/2014: 1-39% LICA< 60-79% RICA borderline >80%.  . Cerebrovascular disease     a. 08/2013 MRA brain: multiple bilateral moderate and high grade intracranial stenoses  . Cardiac arrest 08/14/13    a. Possible arrest in setting of hypotension during dialysis with possible arrest requiring brief CPR, not intubated and was fully intact after episode. Suspected due to low cerebral perfusion in setting of hypotension. Unclear if any arrhythmia.  . End stage renal disease on dialysis   . Peripheral arterial occlusive disease   . Hypertension   . Diabetes mellitus type 2, uncontrolled, with complications   . DVT (deep venous thrombosis)      Right internal jugular vein  . CAD (coronary artery disease), native coronary artery 2011    Diffuse distal vessel CAD noted at cardiac catheterization  . MI (myocardial infarction)   . Stroke    Past Surgical History  Procedure Laterality Date  . Av fistula placement Left 2011    upper arm  . Below knee leg amputation Left 2002  . Tonsillectomy    . Cataract extraction w/ intraocular lens  implant, bilateral Bilateral 2012-2014  . Esophagogastroduodenoscopy N/A 08/30/2013    Procedure: ESOPHAGOGASTRODUODENOSCOPY (EGD);  Surgeon: Theda Belfast, MD;  Location: [facility] ENDOSCOPY;  Service: Endoscopy;  Laterality: N/A;  . Below knee leg amputation Left   . Av fistula placement Left    Family History  Problem Relation Age of Onset  . Hypertension Mother   . Hypertension Father   . Heart attack Brother   . Stroke Mother   . Pulmonary embolism Father    History  Substance Use Topics  . Smoking status: Never Smoker   . Smokeless tobacco: Not on file  . Alcohol Use: No   OB History   Grav Para Term Preterm Abortions TAB SAB Ect Mult Living                 Review of Systems   Unable to perform ROS     Allergies  Phenergan and Vitamin d analogs  Home Medications   Prior to Admission medications   Medication Sig Start Date End Date Taking? Authorizing Provider  albuterol (PROVENTIL) (2.5 MG/3ML) 0.083% nebulizer solution Take 3 mLs (2.5 mg total) by nebulization every 4 (four) hours as needed for wheezing. 12/17/13   Calvert Cantor, MD  b complex-vitamin c-folic acid (NEPHRO-VITE) 0.8 MG TABS tablet Take 1 tablet by mouth daily.    Historical Provider, MD  dextromethorphan (DELSYM) 30 MG/5ML liquid Take 5 mLs (30 mg total) by mouth 2 (two) times daily. 12/17/13   Calvert Cantor, MD  famotidine (PEPCID) 20 MG tablet Take 1 tablet (20 mg total) by mouth at bedtime. 10/25/13   Evlyn Kanner Love, PA-C  fluticasone (FLOVENT HFA) 110 MCG/ACT inhaler Inhale 2 puffs into the lungs 2 (two) times daily. 12/17/13   Calvert Cantor, MD  glimepiride (AMARYL) 1 MG tablet Take 1 tablet (1 mg total) by mouth daily with breakfast. 10/25/13   Jacquelynn Cree, PA-C  guaiFENesin (MUCINEX) 600 MG 12 hr tablet Take 1 tablet (600 mg total) by mouth 2 (two) times daily. 12/17/13   Calvert Cantor, MD  levofloxacin (LEVAQUIN) 250 MG tablet Take 1 tablet (250 mg total) by mouth every other day. 12/19/13   Calvert Cantor, MD  midodrine (PROAMATINE) 5 MG tablet Take 1 tablet (5 mg total) by mouth every Monday, Wednesday, and Friday with hemodialysis. 10/25/13   Jacquelynn Cree, PA-C  Nutritional Supplements (FEEDING SUPPLEMENT, NEPRO CARB STEADY,) LIQD Take 237 mLs by mouth daily.    Historical Provider, MD  pantoprazole (PROTONIX) 40 MG tablet Take 1 tablet (40 mg total) by mouth 2 (two) times daily. 10/25/13   Jacquelynn Cree, PA-C  prednisoLONE acetate (PRED FORTE) 1 % ophthalmic suspension Place 1 drop into both eyes 3 (three) times daily. 12/17/13   Calvert Cantor, MD  sevelamer carbonate (RENVELA) 800 MG tablet Take 1,600 mg by mouth 3 (three) times daily with meals.     Historical Provider, MD   There were no vitals  taken for this visit. Physical Exam  Nursing note and vitals reviewed. Constitutional: She appears well-developed and well-nourished.  HENT:  Head: Normocephalic and atraumatic.  Dialysis catheter left chest  Neck: Normal range of motion.  Cardiovascular: Tachycardia present.   Abdominal: Soft. Bowel sounds are normal.  Musculoskeletal:  Left BKA  Neurological:  The cerebrate posturing noted  Skin: Skin is warm and dry.    ED Course  INTUBATION Date/Time: 06/27/2014 12:43 PM Performed by: Hilario Quarry Authorized by: Hilario Quarry Consent: The procedure was performed in an emergent situation. Time out: Immediately prior to procedure a "time out" was called to  verify the correct patient, procedure, equipment, support staff and site/side marked as required. Indications: airway protection Intubation method: video-assisted Patient status: paralyzed (RSI) Preoxygenation: nonrebreather mask Paralytic: rocuronium Tube size: 8.0 mm Tube type: cuffed Number of attempts: 1 Cricoid pressure: yes Cords visualized: yes Post-procedure assessment: chest rise and ETCO2 monitor Breath sounds: equal Cuff inflated: yes ETT to lip: 23 cm Tube secured with: ETT holder Chest x-Jacquelene Kopecky interpreted by radiologist. Chest x-Scherrie Seneca findings: endotracheal tube too low Tube repositioned: tube repositioned successfully Patient tolerance: Patient tolerated the procedure well with no immediate complications.   (including critical care time) Labs Review Labs Reviewed  CBC WITH DIFFERENTIAL - Abnormal; Notable for the following:    RBC 3.85 (*)    All other components within normal limits  COMPREHENSIVE METABOLIC PANEL - Abnormal; Notable for the following:    Sodium 135 (*)    Potassium 6.5 (*)    Chloride 92 (*)    CO2 17 (*)    Glucose, Bld 191 (*)    BUN 93 (*)    Creatinine, Ser 10.75 (*)    AST 49 (*)    ALT 44 (*)    Alkaline Phosphatase 247 (*)    GFR calc non Af Amer 3 (*)    GFR  calc Af Amer 4 (*)    Anion gap 26 (*)    All other components within normal limits  CBG MONITORING, ED - Abnormal; Notable for the following:    Glucose-Capillary 199 (*)    All other components within normal limits  I-STAT ARTERIAL BLOOD GAS, ED - Abnormal; Notable for the following:    pH, Arterial 7.275 (*)    pO2, Arterial 387.0 (*)    Bicarbonate 19.6 (*)    Acid-base deficit 7.0 (*)    All other components within normal limits  I-STAT CHEM 8, ED - Abnormal; Notable for the following:    Sodium 133 (*)    Potassium 6.3 (*)    BUN 100 (*)    Creatinine, Ser 11.30 (*)    Glucose, Bld 194 (*)    Calcium, Ion 1.02 (*)    All other components within normal limits  TROPONIN I  BLOOD GAS, ARTERIAL  URINALYSIS, ROUTINE W REFLEX MICROSCOPIC  BLOOD GAS, ARTERIAL  BASIC METABOLIC PANEL  BASIC METABOLIC PANEL  BASIC METABOLIC PANEL  BASIC METABOLIC PANEL  BASIC METABOLIC PANEL  BASIC METABOLIC PANEL  BASIC METABOLIC PANEL  PROTIME-INR  PROTIME-INR  APTT  APTT  BLOOD GAS, ARTERIAL    Imaging Review Ct Head Wo Contrast  06/27/2014   CLINICAL DATA:  Cardiac arrest following dialysis catheter placement. Hypertension.  EXAM: CT HEAD WITHOUT CONTRAST  TECHNIQUE: Contiguous axial images were obtained from the base of the skull through the vertex without intravenous contrast.  COMPARISON:  CT head without contrast 12/15/2013.  FINDINGS: Mild generalized atrophy and white matter disease is stable compared to prior exam. There is a remote lacunar infarct within the inferior midbrain. No acute infarct, hemorrhage, or mass lesion is present. The ventricles are of normal size. No significant extraaxial fluid collection is present.  The paranasal sinuses and mastoid air cells are clear. The osseous skull is intact. Atherosclerotic calcifications are noted.  IMPRESSION: 1. No acute intracranial abnormality or significant interval change. 2. Stable atrophy and white matter disease.    Electronically Signed   By: Gennette Pachris  Mattern M.D.   On: 06/27/2014 12:20   Dg Chest Portable 1 View  06/27/2014   CLINICAL DATA:  Cardiac arrest.  Endotracheal tube placement.  EXAM: PORTABLE CHEST - 1 VIEW  COMPARISON:  One-view chest x-Lilit Cinelli 12/15/2013  FINDINGS: The heart is enlarged. The lung volumes are low. The endotracheal tube extends into the right mainstem bronchus, cm be on the carina. A left IJ dialysis catheter is in place. There is no pneumothorax. The NG tube courses off the inferior border of the film. Mild interstitial edema is present.  IMPRESSION: 1. The endotracheal tube terminates within the right mainstem bronchus. 2. Cardiomegaly and mild edema. 3. New left IJ catheter without radiographic evidence for complication.   Electronically Signed   By: Gennette Pac M.D.   On: 06/27/2014 12:16   ET tube pulled back 4 cm with good bilateral breath sounds.   EKG Interpretation   Date/Time:  Monday June 27 2014 11:08:12 EDT Ventricular Rate:  104 PR Interval:  155 QRS Duration: 142 QT Interval:  393 QTC Calculation: 517 R Axis:   -59 Text Interpretation:  Sinus tachycardia Left bundle branch block Left axis  deviation HEART RATE INCREASED SINCE last tracing 12/12/13 Confirmed by Rokhaya Quinn  MD, Duwayne Heck [phone]) on 06/27/2014 11:37:48 AM      MDM   Final diagnoses:  Cardiac arrest    68 y.o. Female dialysis patient s/p arrest at op facility with undocumented rhythm but pulseless for two minutes by report 100 and Consulted critical care and Dr. Vassie Loll is in department.  They have decided to proceed with post arrest and arrest hypothermia protocol.    CRITICAL CARE Performed by: Hilario Quarry Total critical care time: 60 Critical care time was exclusive of separately billable procedures and treating other patients. Critical care was necessary to treat or prevent imminent or life-threatening deterioration. Critical care was time spent personally by me on the following activities:  development of treatment plan with patient and/or surrogate as well as nursing, discussions with consultants, evaluation of patient's response to treatment, examination of patient, obtaining history from patient or surrogate, ordering and performing treatments and interventions, ordering and review of laboratory studies, ordering and review of radiographic studies, pulse oximetry and re-evaluation of patient's condition.   Hilario Quarry, MD 06/27/14 [phone]

## 2014-06-27 NOTE — Consult Note (Signed)
Renal Service Consult Note [facility]  Briana Gould 06/27/2014 Briana Gould Requesting Physician: Dr Vassie Loll  Reason for Consult:  ESRD pt with resp arrest HPI: The patient is a 68 y.o. year-old with hx of DM, L AKA, ESRD on HD. Hx of recurrent cardiac arrest, hx severe ASCVS not candidate for intervention Gould/t GIB on APA's. Also severe 3V CAD by cath in Mar 2015 not candidate for intervention Gould/t severe bleeding issues on APA"s. Pt's vasc access was infiltrated at HD on Friday. She presented to [facility] center today and underwent L IJ [facility] cath placement. She rec'Gould IV fentanyl and versed. She became unresponsive and apneic, underwent CPR and rec'Gould narcan and romazicon. She had ROSC. EMS came and transported to [facility]. Has some spont breathing en route. Was having some decerbrate posturing on arrival to ED.  Is on vent now, not responding.    Chart review: 02 - abcess / osteo L foot, MRSA wound cx+ > underwent I&Gould of L foot, left wound open, Rx Abx 02 - fem-tib bypass graft w skin graft to LLE, DM2, HTN 02 - infected fem-tib bypass graft > ligation of fem-tib bypass graft, L BKA, AKI, anemia, shock. Seen by neuropsych for depression 08 - HTN crisis, DM2 poor control, CKD, ASPVD 11 - syncope unknown etiology, NSTEMI, ESRD on HD, DM2 > left heart cath nonobstructive disease, severe MR 12 - acute on chronic anemia, gen weakness, ESRD on HD, DM, HTN, L BKA > prbc's 2/13 - CP, cough, fever > dx with HCAP rx with IV abx, +trop 2/2 demand isch, uncont HTN, vol excess 12/5- 08/20/13 > hypotension/ LOC during OP HD > admitted, had cardiac arrest 10 min into first IP dialysis. Tx to ICU. Brief CPR, not intubated, no cognitive effects. W/U with R IJ thrombus. MRI tiny area altered signal L motor. rx with coumadin for IJ thrombosis. ESRD on HD. DM. 12/21- 09/03/13 > melena, hematemesis on coumadin, INR 1.6 on admission > Hb 7.3, rx FFP, PPI drip. Had bradycardia, cardiology felt  poor PPM candidate Gould/t infection risk. Rx dopamine, EGD showed esophagitis, antral ulcers with patchy gastritis, duod ulcers with duodenitis. DA weaned off. DC'Gould home.  1/12- 10/15/13 > 20 min into OP dialysis had PEA cardiac arrest. 20 min CPR with ROSC. Was evaluated and MRA showed severe intracerebral arterial stenosis. Started on Brilinta in prep for angiogram/ cerebral stent, but developed UGIB so asa and Brilinta were stopped and angiogram/ stent procedure was cancelled. AFter that pt decided not to further puruse cerebral angiogram and had Gould/w Dr Titus Dubin.   2/6- 10/26/13 > rehab stay  3/13- 11/24/13 > pulseless with agonal breathing, mild cardiac arrest,  near beginning of OP HD > admitted , heart cath showed severe extensive disease (100% RCA, >80% lesions of LAD and LCx), not a candidate for intervention. ESRD on HD, keep BP over 130-140 if possible 4/5- 12/17/13 > weak, fell, cough, hypoxic in ED > had emerg HD in ED, didn't help much and etiology of resp distress felt to be do PNA. Rx with IV then po abx for 7 Gould total. Also ESRD on HD   ROS  n/a  Past Medical History  Past Medical History  Diagnosis Date  . PAD (peripheral artery disease)     a. L BKA  . Type II diabetes mellitus   . Anemia   . History of blood transfusion 2002    "related to amputation" (08/13/2013)  . End stage renal disease  on dialysis     [facility]; M, W, F" (08/13/2013)  . DVT (deep venous thrombosis)     a. R IJ DVT 08/2013.  Marland Kitchen. CVA (cerebral infarction)     a. MRI 08/2013: possible left motor strip infarct with remote pontine infarct.  . Pulmonary HTN     a. Cath 2011 - mod to severe pulmonary hypertension. b. Echo 08/2013: PAP 45mmHg (mild-mod increased).  . Thrombocytopenia   . CAD (coronary artery disease)     a. Severe diffuse diabetic disease by cath 2011, for med rx, not a candidate for CABG or PCI at that time.  . Aortic stenosis     mild  . Syncope     a. 2011, etiology never  clear.  . Carotid artery disease     a. Dopp 38/756412/2014: 1-39% LICA< 60-79% RICA borderline >80%.  . Cerebrovascular disease     a. 08/2013 MRA brain: multiple bilateral moderate and high grade intracranial stenoses  . Cardiac arrest 08/14/13    a. Possible arrest in setting of hypotension during dialysis with possible arrest requiring brief CPR, not intubated and was fully intact after episode. Suspected due to low cerebral perfusion in setting of hypotension. Unclear if any arrhythmia.  . End stage renal disease on dialysis   . Peripheral arterial occlusive disease   . Hypertension   . Diabetes mellitus type 2, uncontrolled, with complications   . DVT (deep venous thrombosis)      Right internal jugular vein  . CAD (coronary artery disease), native coronary artery 2011    Diffuse distal vessel CAD noted at cardiac catheterization  . MI (myocardial infarction)   . Stroke    Past Surgical History  Past Surgical History  Procedure Laterality Date  . Av fistula placement Left 2011    upper arm  . Below knee leg amputation Left 2002  . Tonsillectomy    . Cataract extraction w/ intraocular lens  implant, bilateral Bilateral 2012-2014  . Esophagogastroduodenoscopy N/A 08/30/2013    Procedure: ESOPHAGOGASTRODUODENOSCOPY (EGD);  Surgeon: Theda BelfastPatrick Gould Hung, MD;  [facility];  Service: Endoscopy;  Laterality: N/A;  . Below knee leg amputation Left   . Av fistula placement Left    Family History  Family History  Problem Relation Age of Onset  . Hypertension Mother   . Hypertension Father   . Heart attack Brother   . Stroke Mother   . Pulmonary embolism Father    Social History  reports that she has never smoked. She does not have any smokeless tobacco history on file. She reports that she does not drink alcohol or use illicit drugs. Allergies  Allergies  Allergen Reactions  . Phenergan [Promethazine Hcl] Other (See Comments)    Causes patient to" vomit more"  . Vitamin Gould  Analogs Rash   Home medications Prior to Admission medications   Medication Sig Start Date End Date Taking? Authorizing Provider  albuterol (PROVENTIL) (2.5 MG/3ML) 0.083% nebulizer solution Take 3 mLs (2.5 mg total) by nebulization every 4 (four) hours as needed for wheezing. 12/17/13   Calvert CantorSaima Rizwan, MD  b complex-vitamin c-folic acid (NEPHRO-VITE) 0.8 MG TABS tablet Take 1 tablet by mouth daily.    Historical Provider, MD  dextromethorphan (DELSYM) 30 MG/5ML liquid Take 5 mLs (30 mg total) by mouth 2 (two) times daily. 12/17/13   Calvert CantorSaima Rizwan, MD  famotidine (PEPCID) 20 MG tablet Take 1 tablet (20 mg total) by mouth at bedtime. 10/25/13   Jacquelynn CreePamela S Love,  PA-C  fluticasone (FLOVENT HFA) 110 MCG/ACT inhaler Inhale 2 puffs into the lungs 2 (two) times daily. 12/17/13   Calvert CantorSaima Rizwan, MD  glimepiride (AMARYL) 1 MG tablet Take 1 tablet (1 mg total) by mouth daily with breakfast. 10/25/13   Jacquelynn CreePamela S Love, PA-C  guaiFENesin (MUCINEX) 600 MG 12 hr tablet Take 1 tablet (600 mg total) by mouth 2 (two) times daily. 12/17/13   Calvert CantorSaima Rizwan, MD  levofloxacin (LEVAQUIN) 250 MG tablet Take 1 tablet (250 mg total) by mouth every other day. 12/19/13   Calvert CantorSaima Rizwan, MD  midodrine (PROAMATINE) 5 MG tablet Take 1 tablet (5 mg total) by mouth every Monday, Wednesday, and Friday with hemodialysis. 10/25/13   Jacquelynn CreePamela S Love, PA-C  Nutritional Supplements (FEEDING SUPPLEMENT, NEPRO CARB STEADY,) LIQD Take 237 mLs by mouth daily.    Historical Provider, MD  pantoprazole (PROTONIX) 40 MG tablet Take 1 tablet (40 mg total) by mouth 2 (two) times daily. 10/25/13   Jacquelynn CreePamela S Love, PA-C  prednisoLONE acetate (PRED FORTE) 1 % ophthalmic suspension Place 1 drop into both eyes 3 (three) times daily. 12/17/13   Calvert CantorSaima Rizwan, MD  sevelamer carbonate (RENVELA) 800 MG tablet Take 1,600 mg by mouth 3 (three) times daily with meals.     Historical Provider, MD   Liver Function Tests No results found for this basename: AST, ALT, ALKPHOS, BILITOT,  PROT, ALBUMIN,  in the last 168 hours No results found for this basename: LIPASE, AMYLASE,  in the last 168 hours CBC  Recent Labs Lab 06/27/14 1125 06/27/14 1136  WBC 7.5  --   NEUTROABS 5.0  --   HGB 12.0 13.6  HCT 36.1 40.0  MCV 93.8  --   PLT 174  --    Basic Metabolic Panel  Recent Labs Lab 06/27/14 1136  NA 133*  K 6.3*  CL 104  GLUCOSE 194*  BUN 100*  CREATININE 11.30*    Filed Vitals:   06/27/14 1105 06/27/14 1130 06/27/14 1200 06/27/14 1215  BP:  224/104 208/92 205/86  Pulse:  109 100 96  Resp:  16 19 18   Height: 5\' 6"  (1.676 m)     SpO2:  100% 100% 100%   Exam On vent, unresponsive No rash, cyanosis or gangrene Sclera anicteric, throat clear No jvd Chest occ rales bilat RRR 2/6 SEM no gallop Abd obese, soft, NTND, no mass 1+ RLE edema, L BKA no edema, no UE edema L IJ TDC intact; LUA AVF +bruit Neuro is unresponsive  CXR - CM w mild edema  HD: MWF  F180  4h 75kg   400800  2/2.0 [place]  Heparin 5000    ARanesp 25 every 2 weeks (last 10/07), Venofer 50 mg / wed, Hectorol 5 ug TIW  Assessment: 1 Cardioresp arrest 2 ESRD on HD 3 Severe known CAD - not surg candidate 4 Severe known ASCVA - not candidate for procedure Gould/t GIB on antilplatelets 5 HTN - keeping SBP over 130-140 if at all possible during dialsysi 6 DM per prim 7 HPTH cont vit Gould, resume Renvela 2 ac tid when eating 8 CM EF 35% 9 Hx recurrent card arrest 10 Hyperkalemia K 6.7 11 Pulm edema / vol excess - up 7 kg by wts  Plan- HD in ICU today, get K down, get vol down  Vinson Moselleob Lovie Agresta MD (pgr) [phone]    (c) [phone] 06/27/2014, 12:25 PM

## 2014-06-27 NOTE — ED Notes (Signed)
Did temp foley on patient size 14 french no urine in return

## 2014-06-27 NOTE — ED Notes (Signed)
Pt has small skin tear to left lower shin. No bleeding noted.

## 2014-06-27 NOTE — Progress Notes (Signed)
Utilization Review Completed.Briana Gould T10/19/2015  

## 2014-06-27 NOTE — Progress Notes (Signed)
CRITICAL VALUE ALERT  Critical value received:  K 6.7  Date of notification:  10/19   Time of notification:  1420  Critical value read back:Yes.    Nurse who received alert:  Orson GearJoshua Christian Destynie Toomey RN   MD notified (1st page):  Vassie LollAlva MD   Time of first page:  1430  MD notified (2nd page):  Time of second page:  Responding MD: Vassie LollAlva MD   Time MD responded:  [phone]

## 2014-06-27 NOTE — ED Notes (Signed)
Pt yellow bracelet and yellow/silver watch placed in cup with name label.

## 2014-06-27 NOTE — ED Notes (Signed)
CCMD PA placed left femoral line.

## 2014-06-27 NOTE — ED Notes (Signed)
Zoll pads place on pt. Pt still unresponsive. Pt successfully intubated.

## 2014-06-27 NOTE — ED Notes (Signed)
Nephrology and critical care MD at bedside

## 2014-06-27 NOTE — ED Notes (Signed)
[name] at bedside. MD informed pt's family on situation. Gave pt's dtr pt's yellow ear rings, yellow bracelet, silver/yellow watch and prosthetic leg/shoe

## 2014-06-28 ENCOUNTER — Inpatient Hospital Stay (HOSPITAL_COMMUNITY): Payer: Medicare Other

## 2014-06-28 DIAGNOSIS — I679 Cerebrovascular disease, unspecified: Secondary | ICD-10-CM

## 2014-06-28 DIAGNOSIS — G9340 Encephalopathy, unspecified: Secondary | ICD-10-CM

## 2014-06-28 DIAGNOSIS — I469 Cardiac arrest, cause unspecified: Secondary | ICD-10-CM

## 2014-06-28 DIAGNOSIS — E1322 Other specified diabetes mellitus with diabetic chronic kidney disease: Secondary | ICD-10-CM

## 2014-06-28 LAB — BASIC METABOLIC PANEL
ANION GAP: 20 — AB (ref 5–15)
Anion gap: 17 — ABNORMAL HIGH (ref 5–15)
Anion gap: 19 — ABNORMAL HIGH (ref 5–15)
Anion gap: 21 — ABNORMAL HIGH (ref 5–15)
Anion gap: 21 — ABNORMAL HIGH (ref 5–15)
Anion gap: 23 — ABNORMAL HIGH (ref 5–15)
BUN: 21 mg/dL (ref 6–23)
BUN: 30 mg/dL — AB (ref 6–23)
BUN: 44 mg/dL — ABNORMAL HIGH (ref 6–23)
BUN: 47 mg/dL — ABNORMAL HIGH (ref 6–23)
BUN: 49 mg/dL — ABNORMAL HIGH (ref 6–23)
BUN: 51 mg/dL — ABNORMAL HIGH (ref 6–23)
CALCIUM: 9.7 mg/dL (ref 8.4–10.5)
CALCIUM: 9.6 mg/dL (ref 8.4–10.5)
CALCIUM: 9.5 mg/dL (ref 8.4–10.5)
CHLORIDE: 98 meq/L (ref 96–112)
CHLORIDE: 97 meq/L (ref 96–112)
CO2: 21 mEq/L (ref 19–32)
CO2: 22 mEq/L (ref 19–32)
CO2: 22 mEq/L (ref 19–32)
CO2: 22 meq/L (ref 19–32)
CO2: 23 mEq/L (ref 19–32)
CO2: 25 mEq/L (ref 19–32)
CREATININE: 6.32 mg/dL — AB (ref 0.50–1.10)
CREATININE: 4.14 mg/dL — AB (ref 0.50–1.10)
CREATININE: 3.42 mg/dL — AB (ref 0.50–1.10)
Calcium: 9.7 mg/dL (ref 8.4–10.5)
Calcium: 9.3 mg/dL (ref 8.4–10.5)
Calcium: 9.2 mg/dL (ref 8.4–10.5)
Chloride: 101 mEq/L (ref 96–112)
Chloride: 93 mEq/L — ABNORMAL LOW (ref 96–112)
Chloride: 96 mEq/L (ref 96–112)
Chloride: 98 mEq/L (ref 96–112)
Creatinine, Ser: 5.99 mg/dL — ABNORMAL HIGH (ref 0.50–1.10)
Creatinine, Ser: 6.57 mg/dL — ABNORMAL HIGH (ref 0.50–1.10)
Creatinine, Ser: 6.89 mg/dL — ABNORMAL HIGH (ref 0.50–1.10)
GFR calc Af Amer: 7 mL/min — ABNORMAL LOW (ref >90)
GFR calc Af Amer: 7 mL/min — ABNORMAL LOW (ref >90)
GFR calc Af Amer: 6 mL/min — ABNORMAL LOW (ref >90)
GFR calc Af Amer: 12 mL/min — ABNORMAL LOW (ref >90)
GFR calc Af Amer: 15 mL/min — ABNORMAL LOW (ref >90)
GFR calc non Af Amer: 6 mL/min — ABNORMAL LOW (ref >90)
GFR calc non Af Amer: 6 mL/min — ABNORMAL LOW (ref >90)
GFR calc non Af Amer: 5 mL/min — ABNORMAL LOW (ref >90)
GFR, EST AFRICAN AMERICAN: 8 mL/min — AB (ref >90)
GFR, EST NON AFRICAN AMERICAN: 10 mL/min — AB (ref >90)
GFR, EST NON AFRICAN AMERICAN: 13 mL/min — AB (ref >90)
GFR, EST NON AFRICAN AMERICAN: 6 mL/min — AB (ref >90)
GLUCOSE: 125 mg/dL — AB (ref 70–99)
GLUCOSE: 96 mg/dL (ref 70–99)
GLUCOSE: 93 mg/dL (ref 70–99)
GLUCOSE: 152 mg/dL — AB (ref 70–99)
Glucose, Bld: 129 mg/dL — ABNORMAL HIGH (ref 70–99)
Glucose, Bld: 87 mg/dL (ref 70–99)
POTASSIUM: 5.1 meq/L (ref 3.7–5.3)
POTASSIUM: 4.9 meq/L (ref 3.7–5.3)
POTASSIUM: 3.7 meq/L (ref 3.7–5.3)
POTASSIUM: 4.2 meq/L (ref 3.7–5.3)
Potassium: 4.7 mEq/L (ref 3.7–5.3)
Potassium: 5 mEq/L (ref 3.7–5.3)
SODIUM: 140 meq/L (ref 137–147)
SODIUM: 136 meq/L — AB (ref 137–147)
Sodium: 140 mEq/L (ref 137–147)
Sodium: 141 mEq/L (ref 137–147)
Sodium: 143 mEq/L (ref 137–147)
Sodium: 139 mEq/L (ref 137–147)

## 2014-06-28 LAB — CBC
HCT: 31.6 % — ABNORMAL LOW (ref 36.0–46.0)
Hemoglobin: 10.8 g/dL — ABNORMAL LOW (ref 12.0–15.0)
MCH: 31.7 pg (ref 26.0–34.0)
MCHC: 34.2 g/dL (ref 30.0–36.0)
MCV: 92.7 fL (ref 78.0–100.0)
PLATELETS: 146 10*3/uL — AB (ref 150–400)
RBC: 3.41 MIL/uL — ABNORMAL LOW (ref 3.87–5.11)
RDW: 14.9 % (ref 11.5–15.5)
WBC: 5.5 10*3/uL (ref 4.0–10.5)

## 2014-06-28 LAB — GLUCOSE, CAPILLARY
GLUCOSE-CAPILLARY: 126 mg/dL — AB (ref 70–99)
GLUCOSE-CAPILLARY: 118 mg/dL — AB (ref 70–99)
GLUCOSE-CAPILLARY: 90 mg/dL (ref 70–99)
GLUCOSE-CAPILLARY: 90 mg/dL (ref 70–99)
Glucose-Capillary: 118 mg/dL — ABNORMAL HIGH (ref 70–99)
Glucose-Capillary: 128 mg/dL — ABNORMAL HIGH (ref 70–99)
Glucose-Capillary: 135 mg/dL — ABNORMAL HIGH (ref 70–99)
Glucose-Capillary: 137 mg/dL — ABNORMAL HIGH (ref 70–99)
Glucose-Capillary: 99 mg/dL (ref 70–99)
Glucose-Capillary: 81 mg/dL (ref 70–99)
Glucose-Capillary: 96 mg/dL (ref 70–99)
Glucose-Capillary: 125 mg/dL — ABNORMAL HIGH (ref 70–99)

## 2014-06-28 LAB — HEPATITIS B SURFACE ANTIGEN: HEP B S AG: NEGATIVE

## 2014-06-28 MED ORDER — NOREPINEPHRINE BITARTRATE 1 MG/ML IV SOLN
0.0000 ug/min | INTRAVENOUS | Status: DC
  Administered 2014-06-28: 3 ug/min via INTRAVENOUS
  Administered 2014-06-28: 2 ug/min via INTRAVENOUS
  Administered 2014-06-29: 8 ug/min via INTRAVENOUS
  Filled 2014-06-28 (×2): qty 4

## 2014-06-28 MED ORDER — HYDRALAZINE HCL 20 MG/ML IJ SOLN
10.0000 mg | Freq: Three times a day (TID) | INTRAMUSCULAR | Status: DC
  Administered 2014-06-28: 10 mg via INTRAVENOUS
  Filled 2014-06-28: qty 1
  Filled 2014-06-28 (×6): qty 0.5

## 2014-06-28 MED ORDER — DEXTROSE 10 % IV SOLN
INTRAVENOUS | Status: DC
  Administered 2014-06-28 – 2014-06-29 (×2): via INTRAVENOUS
  Filled 2014-06-28: qty 1000

## 2014-06-28 NOTE — Plan of Care (Signed)
Problem: Consults Goal: Chaplain Consult Outcome: Completed/Met Date Met:  06/28/14 Chaplain met with family on admission; will follow up if needed.     

## 2014-06-28 NOTE — Plan of Care (Signed)
Problem: Consults Goal: Cardiac Arrest Patient Education (See Patient Education module for education specifics.)  Outcome: Completed/Met Date Met:  06/28/14 Given to sister of patient and daughter, Hinton Dyer.

## 2014-06-28 NOTE — Procedures (Signed)
I was present at this dialysis session, have reviewed the session itself and made  appropriate changes  Vinson Moselleob Wilena Tyndall MD (pgr) [phone]    [phone] 06/28/2014, 1:50 PM

## 2014-06-28 NOTE — Progress Notes (Signed)
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 edema, no UE edema  L IJ TDC intact; LUA AVF +bruit  Neuro is unresponsive  CXR - CM w mild edema  HD: MWF  F180 4h 75kg 400/800 2/2.0 Bath Heparin 5000  Aranesp 25 every 2 weeks (last 10/07), Venofer 50 mg / wed, Hectorol 5 ug TIW   Assessment:  1 Cardioresp arrest - on cooling protocol 2 Resp failure / pulm edema / vol excess - still wet, still up 8 kg over dry wt 3 ESRD on HD - infiltrated AVF, using tunneled HD cath for now 4 Severe known CAD - not surg candidate  5 Severe known ASCVA - not candidate for procedure d/t GIB on antilplatelets  6 HTN - keeping SBP up on HD over 120-140 due to hx of severe ASCVD 7 DM per prim  8 HPTH cont vit D, resume Renvela 2 ac tid when eating  9 CM EF 35%  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  11 Hyperkalemia - resolved with HD   Plan- HD today and tomorrow, lower vol, keep SBP over 130 with pressors    Vinson Moselleob Lorren Splawn MD  pager [phone]    cell [phone]  06/28/2014, 11:05 AM     Recent Labs Lab 06/28/14 0010 06/28/14 0420 06/28/14 0800  NA 136* 140 141  K 5.0 5.1 4.9  CL 93* 96 98  CO2 22 21 22   GLUCOSE 129* 125* 96  BUN 44* 47* 49*  CREATININE 5.99* 6.32* 6.57*  CALCIUM 9.5 9.7 9.7    Recent Labs Lab 06/27/14 1125  AST 49*  ALT 44*  ALKPHOS 247*  BILITOT 0.5  PROT 8.2  ALBUMIN 3.8    Recent Labs Lab 06/27/14 1125  06/27/14 1633 06/27/14 1822 06/28/14 1000  WBC 7.5  --   --    --  5.5  NEUTROABS 5.0  --   --   --   --   HGB 12.0  < > 13.3 13.6 10.8*  HCT 36.1  < > 39.0 40.0 31.6*  MCV 93.8  --   --   --  92.7  PLT 174  --   --   --  146*  < > = values in this interval not displayed. Marland Kitchen. albuterol  2.5 mg Nebulization Q4H  . antiseptic oral rinse  7 mL Mouth Rinse QID  . artificial tears  1 application Both Eyes 3 times per day  . chlorhexidine  15 mL Mouth Rinse BID  . fentaNYL  100 mcg Intravenous Once  . fentaNYL  100 mcg Intravenous Once  . heparin  5,000 Units Subcutaneous 3 times per day  . hydrALAZINE  10 mg Intravenous 3 times per day  . insulin aspart  1-3 Units Subcutaneous 6 times per day  . midazolam  2 mg Intravenous Once  .  midazolam  2 mg Intravenous Once  . pantoprazole (PROTONIX) IV  40 mg Intravenous Daily  . prednisoLONE acetate  1 drop Both Eyes TID  . sodium chloride  10-40 mL Intracatheter Q12H   . cisatracurium (NIMBEX) infusion 1 mcg/kg/min (06/28/14 0800)  . fentaNYL infusion INTRAVENOUS 200 mcg/hr (06/28/14 0922)  . midazolam (VERSED) infusion 5 mg/hr (06/28/14 0800)  . norepinephrine (LEVOPHED) Adult infusion     cisatracurium, fentaNYL, labetalol, midazolam, sodium chloride

## 2014-06-28 NOTE — Progress Notes (Signed)
PULMONARY / CRITICAL CARE MEDICINE   Name: Briana Gould MRN: [id number] DOB: 31-Mar-1946    ADMISSION DATE:  06/27/2014 CONSULTATION DATE:  10/19  REFERRING MD :  Rosalia Hammersay   CHIEF COMPLAINT:  Cardiac arrest   INITIAL PRESENTATION:  68 year old female admitted on 10/19 s/p cardiopulmonary arrest after placement of tunneled HD cath in out-pt surgical center. Pt had hematoma involving her left AV fistula making HD access difficult. No HD since 10/14. Per surgical center pt not on monitor. Noted to not be breathing ~ 5 minutes s/p procedure. Oral airway placed. Assisted ventilation initiated. No pulses noted. Got CPR x 2 minutes then ROSC noted. Presented to the ER unresponsive w/ ineffective ventilation and posturing. PCCM asked to admit.   STUDIES:  CT head 10/19>>>NAICP  SIGNIFICANT EVENTS: Echo 10/19>>>   SUBJECTIVE:  Paralyzed on vent   VITAL SIGNS: Temp:  [88.2 F (31.2 C)-95.2 F (35.1 C)] 91.2 F (32.9 C) (10/20 0800) Pulse Rate:  [43-116] 51 (10/20 0800) Resp:  [11-19] 18 (10/20 0800) BP: (139-224)/(48-104) 153/48 mmHg (10/20 0800) SpO2:  [97 %-100 %] 100 % (10/20 0800) FiO2 (%):  [40 %-70 %] 40 % (10/20 0800) Weight:  [81.6 kg (179 lb 14.3 oz)-86.3 kg (190 lb 4.1 oz)] 83.6 kg (184 lb 4.9 oz) (10/19 1942) HEMODYNAMICS: CVP:  [8 mmHg-9 mmHg] 9 mmHg VENTILATOR SETTINGS: Vent Mode:  [-] PRVC FiO2 (%):  [40 %-70 %] 40 % Set Rate:  [18 bmp] 18 bmp Vt Set:  [470 mL-480 mL] 480 mL PEEP:  [5 cmH20] 5 cmH20 Plateau Pressure:  [18 cmH20-20 cmH20] 20 cmH20 INTAKE / OUTPUT:  Intake/Output Summary (Last 24 hours) at 06/28/14 0847 Last data filed at 06/28/14 0800  Gross per 24 hour  Intake 350.06 ml  Output   4145 ml  Net -3794.94 ml    PHYSICAL EXAMINATION: General:  Paralyzed on vent HEENT: NCAT, ETT in place, L scleritis, R pupil reactive PULM: CTA B CV: Brady, irregular, limited by pads AB: limited by pads, hypoactive bowel sounds Ext: cool, s/p L  AKA Neuro: paralyzed on vent  LABS:  CBC  Recent Labs Lab 06/27/14 1125  06/27/14 1424 06/27/14 1633 06/27/14 1822  WBC 7.5  --   --   --   --   HGB 12.0  < > 13.3 13.3 13.6  HCT 36.1  < > 39.0 39.0 40.0  PLT 174  --   --   --   --   < > = values in this interval not displayed. Coag's  Recent Labs Lab 06/27/14 1420 06/27/14 2032  APTT 34 36  INR 1.31 1.18   BMET  Recent Labs Lab 06/28/14 0010 06/28/14 0420 06/28/14 0800  NA 136* 140 141  K 5.0 5.1 4.9  CL 93* 96 98  CO2 22 21 22   BUN 44* 47* 49*  CREATININE 5.99* 6.32* 6.57*  GLUCOSE 129* 125* 96   Electrolytes  Recent Labs Lab 06/28/14 0010 06/28/14 0420 06/28/14 0800  CALCIUM 9.5 9.7 9.7   Sepsis Markers No results found for this basename: LATICACIDVEN, PROCALCITON, O2SATVEN,  in the last 168 hours ABG  Recent Labs Lab 06/27/14 1127 06/27/14 1333  PHART 7.275* 7.304*  PCO2ART 42.1 38.5  PO2ART 387.0* 129.0*   Liver Enzymes  Recent Labs Lab 06/27/14 1125  AST 49*  ALT 44*  ALKPHOS 247*  BILITOT 0.5  ALBUMIN 3.8   Cardiac Enzymes  Recent Labs Lab 06/27/14 1125  TROPONINI <0.30   Glucose  Recent Labs Lab 06/27/14 2140 06/27/14 2358 06/28/14 0213 06/28/14 0423 06/28/14 0614 06/28/14 0743  GLUCAP 126* 135* 137* 128* 118* 90    Imaging Ct Head Wo Contrast  06/27/2014   CLINICAL DATA:  Cardiac arrest following dialysis catheter placement. Hypertension.  EXAM: CT HEAD WITHOUT CONTRAST  TECHNIQUE: Contiguous axial images were obtained from the base of the skull through the vertex without intravenous contrast.  COMPARISON:  CT head without contrast 12/15/2013.  FINDINGS: Mild generalized atrophy and white matter disease is stable compared to prior exam. There is a remote lacunar infarct within the inferior midbrain. No acute infarct, hemorrhage, or mass lesion is present. The ventricles are of normal size. No significant extraaxial fluid collection is present.  The paranasal  sinuses and mastoid air cells are clear. The osseous skull is intact. Atherosclerotic calcifications are noted.  IMPRESSION: 1. No acute intracranial abnormality or significant interval change. 2. Stable atrophy and white matter disease.   Electronically Signed   By: Gennette Pac M.D.   On: 06/27/2014 12:20   Dg Chest Port 1 View  06/27/2014   CLINICAL DATA:  Cardiac arrest. Repositioning of the endotracheal tube.  EXAM: PORTABLE CHEST - 1 VIEW  COMPARISON:  One-view chest from the same day.  FINDINGS: The heart is enlarged. Mild edema is stable. The endotracheal tube has been pulled back and now terminates 3.7 cm above the carina, in satisfactory position. The NG tube courses off the inferior border the film. A left IJ dialysis catheter is stable, terminating in the right atrium. There is no pneumothorax or significant effusion.  IMPRESSION: 1. Interval repositioning of the endotracheal tube, now terminating 3.7 cm above the carina. 2. Stable cardiomegaly and mild edema.   Electronically Signed   By: Gennette Pac M.D.   On: 06/27/2014 14:06   Dg Chest Portable 1 View  06/27/2014   CLINICAL DATA:  Cardiac arrest.  Endotracheal tube placement.  EXAM: PORTABLE CHEST - 1 VIEW  COMPARISON:  One-view chest x-ray 12/15/2013  FINDINGS: The heart is enlarged. The lung volumes are low. The endotracheal tube extends into the right mainstem bronchus, cm be on the carina. A left IJ dialysis catheter is in place. There is no pneumothorax. The NG tube courses off the inferior border of the film. Mild interstitial edema is present.  IMPRESSION: 1. The endotracheal tube terminates within the right mainstem bronchus. 2. Cardiomegaly and mild edema. 3. New left IJ catheter without radiographic evidence for complication.   Electronically Signed   By: Gennette Pac M.D.   On: 06/27/2014 12:16     ASSESSMENT / PLAN:  PULMONARY OETT 10/19 A:  Acute hypoxic respiratory failure s/p cardiac arrest  P:   Continue Full  vent support Daily CXR WUA/SBT once warm  CARDIOVASCULAR CVL Left IJ tunneled HD cath 10/19 Left femoral triple lumen 10/19 > A:  Cardiopulmonary arrest, PEA most likely due to chronic baseline hypotension and pulmonary hypertension HTN Pulmonary edema  Afib  H/o diastolic HF , pul hypertension -RVSP 69 on echo 09/2013 Severe known CAD: She is not a candidate for PCI because of diffuse severe disease and inability to take antiplatelet medications in the setting of severe recurrent anemia (Per Dr Shirlee Latch 12/2013)  P:  PRN labetolol  Add scheduled hydralazine Volume removal w/ HD No anticoagulation considering hematoma left arm and history of GI bleed  RENAL A:   ESRD. Has not had HD since 10/14 Hyperkalemia resolved  P:   HD per renal F/u  chemistry per Hypothermia protocol  Volume removal   GASTROINTESTINAL A:   No acute  P:   NPO  PPI for SUP   HEMATOLOGIC A:  Recent hematoma L AV fistula> appears stable P:  [place] heparin  Serial CBC   INFECTIOUS A:  No acute issues P:   Trend WBC curve   ENDOCRINE A:   DM  P:   SSI  NEUROLOGIC A:   Acute encephalopathy s/p arrest -posturing on ED arrival  H/o CVAs  P:   RASS goal: -5 Hypothermia protocol with paralysis   Family Updates: sister updated at bedside re: plan of care.    Interdisciplinary Family Meetings:    TODAY'S SUMMARY:  S/p cardiopulmonary arrest. Down time unclear as was time she was not on monitor. Now s/p HD, plan to continue hypothermia protocol  CRITICAL CARE: The patient is critically ill with multiple organ systems failure and requires high complexity decision making for assessment and support, frequent evaluation and titration of therapies, application of advanced monitoring technologies and extensive interpretation of multiple databases. Critical Care Time devoted to patient care services described in this note is 45 minutes.   Care during the described time interval was provided by  me and/or other providers on the critical care team.  I have reviewed this patient's available data, including medical history, events of note, physical examination and test results as part of my evaluation  Heber CarolinaBrent McQuaid, MD [facility] Pager: [phone] Cell: [phone] If no response, call [phone]    06/28/2014, 8:47 AM

## 2014-06-28 NOTE — Plan of Care (Signed)
Problem: Consults Goal: Skin Care Protocol Initiated - if Braden Score 18 or less If consults are not indicated, leave blank or document N/A  Outcome: Progressing Turning patient q 2 hours, preventive sacrum dressing applied, heels elevated off of bed, low air mattress

## 2014-06-28 NOTE — Progress Notes (Signed)
Echocardiogram 2D Echocardiogram has been performed.  Briana Gould 06/28/2014, 1:42 PM

## 2014-06-29 ENCOUNTER — Inpatient Hospital Stay (HOSPITAL_COMMUNITY): Payer: Medicare Other

## 2014-06-29 LAB — CBC WITH DIFFERENTIAL/PLATELET
Basophils Absolute: 0 10*3/uL (ref 0.0–0.1)
Basophils Relative: 0 % (ref 0–1)
EOS ABS: 0 10*3/uL (ref 0.0–0.7)
Eosinophils Relative: 0 % (ref 0–5)
HCT: 37.6 % (ref 36.0–46.0)
Hemoglobin: 12.9 g/dL (ref 12.0–15.0)
LYMPHS ABS: 0.6 10*3/uL — AB (ref 0.7–4.0)
Lymphocytes Relative: 5 % — ABNORMAL LOW (ref 12–46)
MCH: 32.3 pg (ref 26.0–34.0)
MCHC: 34.3 g/dL (ref 30.0–36.0)
MCV: 94.2 fL (ref 78.0–100.0)
Monocytes Absolute: 0.8 10*3/uL (ref 0.1–1.0)
Monocytes Relative: 6 % (ref 3–12)
NEUTROS PCT: 89 % — AB (ref 43–77)
Neutro Abs: 11.4 10*3/uL — ABNORMAL HIGH (ref 1.7–7.7)
PLATELETS: 209 10*3/uL (ref 150–400)
RBC: 3.99 MIL/uL (ref 3.87–5.11)
RDW: 15.1 % (ref 11.5–15.5)
WBC: 12.8 10*3/uL — ABNORMAL HIGH (ref 4.0–10.5)

## 2014-06-29 LAB — GLUCOSE, CAPILLARY
GLUCOSE-CAPILLARY: 100 mg/dL — AB (ref 70–99)
GLUCOSE-CAPILLARY: 133 mg/dL — AB (ref 70–99)
Glucose-Capillary: 146 mg/dL — ABNORMAL HIGH (ref 70–99)
Glucose-Capillary: 188 mg/dL — ABNORMAL HIGH (ref 70–99)
Glucose-Capillary: 99 mg/dL (ref 70–99)
Glucose-Capillary: 119 mg/dL — ABNORMAL HIGH (ref 70–99)

## 2014-06-29 LAB — BASIC METABOLIC PANEL
Anion gap: 20 — ABNORMAL HIGH (ref 5–15)
Anion gap: 23 — ABNORMAL HIGH (ref 5–15)
BUN: 24 mg/dL — AB (ref 6–23)
BUN: 26 mg/dL — AB (ref 6–23)
CHLORIDE: 95 meq/L — AB (ref 96–112)
CHLORIDE: 96 meq/L (ref 96–112)
CO2: 20 meq/L (ref 19–32)
CO2: 22 mEq/L (ref 19–32)
Calcium: 9.6 mg/dL (ref 8.4–10.5)
Calcium: 9.6 mg/dL (ref 8.4–10.5)
Creatinine, Ser: 4.03 mg/dL — ABNORMAL HIGH (ref 0.50–1.10)
Creatinine, Ser: 4.46 mg/dL — ABNORMAL HIGH (ref 0.50–1.10)
GFR calc Af Amer: 12 mL/min — ABNORMAL LOW (ref >90)
GFR calc non Af Amer: 10 mL/min — ABNORMAL LOW (ref >90)
GFR calc non Af Amer: 9 mL/min — ABNORMAL LOW (ref >90)
GFR, EST AFRICAN AMERICAN: 11 mL/min — AB (ref >90)
GLUCOSE: 146 mg/dL — AB (ref 70–99)
Glucose, Bld: 147 mg/dL — ABNORMAL HIGH (ref 70–99)
POTASSIUM: 5 meq/L (ref 3.7–5.3)
Potassium: 4.4 mEq/L (ref 3.7–5.3)
Sodium: 137 mEq/L (ref 137–147)
Sodium: 139 mEq/L (ref 137–147)

## 2014-06-29 MED ORDER — SODIUM CHLORIDE 0.9 % IV SOLN: 100.0000 mL | INTRAVENOUS | Status: DC | PRN

## 2014-06-29 MED ORDER — HEPARIN SODIUM (PORCINE) 1000 UNIT/ML DIALYSIS: 2000.0000 [IU] | INTRAMUSCULAR | Status: DC | PRN

## 2014-06-29 MED ORDER — LIDOCAINE HCL (PF) 1 % IJ SOLN: 5.0000 mL | INTRAMUSCULAR | Status: DC | PRN

## 2014-06-29 MED ORDER — NEPRO/CARBSTEADY PO LIQD: 237.0000 mL | ORAL | Status: DC | PRN

## 2014-06-29 MED ORDER — LIDOCAINE-PRILOCAINE 2.5-2.5 % EX CREA
1.0000 "application " | TOPICAL_CREAM | CUTANEOUS | Status: DC | PRN
  Filled 2014-06-29: qty 5

## 2014-06-29 MED ORDER — PENTAFLUOROPROP-TETRAFLUOROETH EX AERO: 1.0000 "application " | INHALATION_SPRAY | CUTANEOUS | Status: DC | PRN

## 2014-06-29 MED ORDER — ALTEPLASE 2 MG IJ SOLR: 2.0000 mg | Freq: Once | INTRAMUSCULAR | Status: DC | PRN

## 2014-06-29 MED ORDER — HEPARIN SODIUM (PORCINE) 1000 UNIT/ML DIALYSIS: 1000.0000 [IU] | INTRAMUSCULAR | Status: DC | PRN

## 2014-06-29 MED ORDER — LIDOCAINE-PRILOCAINE 2.5-2.5 % EX CREA: 1.0000 "application " | TOPICAL_CREAM | CUTANEOUS | Status: DC | PRN

## 2014-06-29 MED ORDER — NEPRO/CARBSTEADY PO LIQD
237.0000 mL | ORAL | Status: DC | PRN
  Filled 2014-06-29: qty 237

## 2014-06-29 MED ORDER — ALTEPLASE 2 MG IJ SOLR
2.0000 mg | Freq: Once | INTRAMUSCULAR | Status: AC | PRN
  Filled 2014-06-29: qty 2

## 2014-06-29 MED ORDER — NOREPINEPHRINE BITARTRATE 1 MG/ML IV SOLN
0.0000 ug/min | INTRAVENOUS | Status: DC
  Administered 2014-06-29: 20 ug/min via INTRAVENOUS
  Administered 2014-06-30: 7 ug/min via INTRAVENOUS
  Filled 2014-06-29 (×2): qty 16

## 2014-06-29 NOTE — Procedures (Signed)
**Note De-Identified Markeshia Giebel Obfuscation** Extubation Procedure Note  Patient Details:   Name: Briana Gould CobbsSwanston Chason DOB: 05-Jun-1946 MRN: [id number]   Airway Documentation:     Evaluation  O2 sats: stable throughout Complications: No apparent complications Patient did tolerate procedure well. Bilateral Breath Sounds: Clear;Diminished Suctioning: Airway No No stridor noted, + leak Kayleena Eke, Megan SalonWendy Cooper 06/29/2014, 12:08 PM

## 2014-06-29 NOTE — Progress Notes (Signed)
[facility] Progress Note   Subjective: cxr clearing up, on vent, on HD  Filed Vitals:   06/29/14 1045 06/29/14 1050 06/29/14 1056 06/29/14 1100  BP: 82/51 151/65 131/60 105/53  Pulse: 39 35 90 42  Temp:      TempSrc:      Resp: 24 22 19 23   Height:      Weight:      SpO2: 100% 100% 100% 100%   Exam: On vent, unresponsive  No jvd  Chest occ rales bilat  RRR 2/6 SEM no gallop  Abd obese, soft, NTND, no mass  1+ RLE edema, L BKA no edema, no UE edema  L IJ TDC intact; LUA AVF +bruit  Neuro is unresponsive  CXR - CM w mild edema  HD: MWF  F180 4h 75kg 400/800 2/2.0 [place] Heparin 5000  Aranesp 25 every 2 weeks (last 10/07), Venofer 50 mg / wed, Hectorol 5 ug TIW   Assessment:  1 Cardioresp arrest - on cooling protocol 2 Resp failure / pulm edema / vol excess - better, getting close to euvolemic 3 ESRD on HD 4 Access - resting infiltrated AVF, has new TDC R chest 5 Severe known CAD - not surg candidate  6 Severe known ASCVA - not candidate for procedure d/t GIB on antilplatelets  7 HTN - keeping SBP up on HD over 115-120 if possible due to hx of severe ASCVD + longstanding HTN 7 DM per prim  8 HPTH cont vit D, resume Renvela 2 ac tid when eating  9 CM EF 35%  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  11 Hyperkalemia - resolved   Plan- HD today, then prob next on Friday    Vinson Moselleob Duyen Beckom MD  pager [phone]    cell [phone]  06/29/2014, 11:12 AM     Recent Labs Lab 06/28/14 1930 06/28/14 2339 06/29/14 0411  NA 139 137 139  K 4.2 4.4 5.0  CL 97 95* 96  CO2 23 22 20   GLUCOSE 152* 146* 147*  BUN 21 24* 26*  CREATININE 3.42* 4.03* 4.46*  CALCIUM 9.2 9.6 9.6    Recent Labs Lab 06/27/14 1125  AST 49*  ALT 44*  ALKPHOS 247*  BILITOT 0.5  PROT 8.2  ALBUMIN 3.8    Recent Labs Lab 06/27/14 1125  06/27/14 1822 06/28/14 1000 06/29/14 0450  WBC 7.5  --   --  5.5 12.8*  NEUTROABS 5.0  --   --   --  11.4*  HGB 12.0  < > 13.6 10.8* 12.9  HCT  36.1  < > 40.0 31.6* 37.6  MCV 93.8  --   --  92.7 94.2  PLT 174  --   --  146* 209  < > = values in this interval not displayed. Marland Kitchen. albuterol  2.5 mg Nebulization Q4H  . antiseptic oral rinse  7 mL Mouth Rinse QID  . chlorhexidine  15 mL Mouth Rinse BID  . heparin  5,000 Units Subcutaneous 3 times per day  . hydrALAZINE  10 mg Intravenous 3 times per day  . insulin aspart  1-3 Units Subcutaneous 6 times per day  . pantoprazole (PROTONIX) IV  40 mg Intravenous Daily  . prednisoLONE acetate  1 drop Both Eyes TID  . sodium chloride  10-40 mL Intracatheter Q12H   . dextrose 10 mL/hr at 06/29/14 0641  . norepinephrine (LEVOPHED) Adult infusion 20 mcg/min (06/29/14 0735)   sodium chloride, sodium chloride, alteplase, feeding supplement (NEPRO CARB STEADY), heparin, heparin,  heparin, labetalol, lidocaine (PF), lidocaine-prilocaine, pentafluoroprop-tetrafluoroeth, sodium chloride

## 2014-06-29 NOTE — Procedures (Signed)
I was present at this dialysis session, have reviewed the session itself and made  appropriate changes  Vinson Moselleob Kimber Fritts MD (pgr) [phone]    [phone] 06/29/2014, 11:15 AM

## 2014-06-29 NOTE — Progress Notes (Signed)
PULMONARY / CRITICAL CARE MEDICINE   Name: Briana Gould MRN: [id number] DOB: [birth year]    ADMISSION DATE:  06/27/2014 CONSULTATION DATE:  10/19  REFERRING MD :  Rosalia Hammersay   CHIEF COMPLAINT:  Cardiac arrest   INITIAL PRESENTATION:  68 year old female admitted on 10/19 s/p cardiopulmonary arrest after placement of tunneled HD cath in out-pt surgical center. Pt had hematoma involving her left AV fistula making HD access difficult. No HD since 10/14. Per surgical center pt not on monitor. Noted to not be breathing ~ 5 minutes s/p procedure. Oral airway placed. Assisted ventilation initiated. No pulses noted. Got CPR x 2 minutes then ROSC noted. Presented to the ER unresponsive w/ ineffective ventilation and posturing. PCCM asked to admit.   STUDIES:  CT head 10/19>>>NAICP Echo 10/19>>> LVEF 45-50%, mild LVF, PA pressure 34mmHg, RV moderately dilated  SIGNIFICANT EVENTS: 10/19>10/20 hypothermia protocol 10/20 HD 10/21 rewarmed, making some purposeful movements    SUBJECTIVE:  HD this morning, following commands   VITAL SIGNS: Temp:  [89.4 F (31.9 C)-99.9 F (37.7 C)] 99.9 F (37.7 C) (10/21 0700) Pulse Rate:  [48-107] 105 (10/21 0737) Resp:  [14-26] 26 (10/21 0737) BP: (76-210)/(40-103) 174/66 mmHg (10/21 0737) SpO2:  [90 %-100 %] 100 % (10/21 0737) FiO2 (%):  [40 %] 40 % (10/21 0737) Weight:  [79.6 kg (175 lb 7.8 oz)-83.7 kg (184 lb 8.4 oz)] 79.6 kg (175 lb 7.8 oz) (10/21 0700) HEMODYNAMICS: CVP:  [4 mmHg-9 mmHg] 4 mmHg VENTILATOR SETTINGS: Vent Mode:  [-] PRVC FiO2 (%):  [40 %] 40 % Set Rate:  [18 bmp] 18 bmp Vt Set:  [480 mL] 480 mL PEEP:  [5 cmH20] 5 cmH20 Plateau Pressure:  [19 cmH20-25 cmH20] 25 cmH20 INTAKE / OUTPUT:  Intake/Output Summary (Last 24 hours) at 06/29/14 0740 Last data filed at 06/29/14 0700  Gross per 24 hour  Intake 1179.68 ml  Output   3525 ml  Net -2345.32 ml    PHYSICAL EXAMINATION: General:  Awake on vent HEENT: NCAT, ETT in place,  L scleritis, R pupil reactive PULM: CTA B CV: Brady, irregular, limited by pads AB: limited by pads, hypoactive bowel sounds Ext: cool, s/p L AKA Neuro: awake, following commands  LABS:  CBC  Recent Labs Lab 06/27/14 1125  06/27/14 1822 06/28/14 1000 06/29/14 0450  WBC 7.5  --   --  5.5 12.8*  HGB 12.0  < > 13.6 10.8* 12.9  HCT 36.1  < > 40.0 31.6* 37.6  PLT 174  --   --  146* 209  < > = values in this interval not displayed. Coag's  Recent Labs Lab 06/27/14 1420 06/27/14 2032  APTT 34 36  INR 1.31 1.18   BMET  Recent Labs Lab 06/28/14 1930 06/28/14 2339 06/29/14 0411  NA 139 137 139  K 4.2 4.4 5.0  CL 97 95* 96  CO2 23 22 20   BUN 21 24* 26*  CREATININE 3.42* 4.03* 4.46*  GLUCOSE 152* 146* 147*   Electrolytes  Recent Labs Lab 06/28/14 1930 06/28/14 2339 06/29/14 0411  CALCIUM 9.2 9.6 9.6   Sepsis Markers No results found for this basename: LATICACIDVEN, PROCALCITON, O2SATVEN,  in the last 168 hours ABG  Recent Labs Lab 06/27/14 1127 06/27/14 1333  PHART 7.275* 7.304*  PCO2ART 42.1 38.5  PO2ART 387.0* 129.0*   Liver Enzymes  Recent Labs Lab 06/27/14 1125  AST 49*  ALT 44*  ALKPHOS 247*  BILITOT 0.5  ALBUMIN 3.8   Cardiac Enzymes  Recent Labs Lab 06/27/14 1125  TROPONINI <0.30   Glucose  Recent Labs Lab 06/28/14 0955 06/28/14 1135 06/28/14 1620 06/28/14 1929 06/28/14 2354 06/29/14 0404  GLUCAP 96 81 90 125* 133* 146*    Imaging Dg Chest Port 1 View  06/28/2014   CLINICAL DATA:  Intubation, coronary artery disease post MI and CABG, personal history of type 2 diabetes, end-stage renal disease on dialysis, stroke, pulmonary hypertension, hypertension  EXAM: PORTABLE CHEST - 1 VIEW  COMPARISON:  Portable exam 0453 hr compared to 06/27/2014  FINDINGS: Tip of endotracheal tube projects 3.3 cm above carina.  Nasogastric tube extends into stomach.  LEFT jugular large-bore dual-lumen central venous catheter tip projects over  inferior RIGHT atrium, unchanged.  Enlargement of cardiac silhouette with pulmonary vascular congestion.  Perihilar infiltrates likely edema.  Rotated to the LEFT.  No gross pleural effusion or pneumothorax.  EKG leads and epicardial pacing leads project over chest.  IMPRESSION: Probable mild pulmonary edema.   Electronically Signed   By: Ulyses SouthwardMark  Boles M.D.   On: 06/28/2014 07:50     ASSESSMENT / PLAN:  PULMONARY OETT 10/19 A:  Acute hypoxic respiratory failure s/p cardiac arrest  P:   SBT now, hopefully extubate Daily CXR  CARDIOVASCULAR CVL Left IJ tunneled HD cath 10/19 Left femoral triple lumen 10/19 > A:  Cardiopulmonary arrest, PEA most likely due to chronic baseline hypotension and pulmonary hypertension HTN Pulmonary edema  Afib  H/o diastolic HF , pulmonary hypertension -RVSP 34 on echo Severe known CAD: She is not a candidate for PCI because of diffuse severe disease and inability to take antiplatelet medications in the setting of severe recurrent anemia (Per Dr Shirlee LatchMcLean 12/2013)  P:  PRN labetolol  Continue scheduled hydralazine Volume removal w/ HD No anticoagulation considering hematoma left arm and history of GI bleed  RENAL A:   ESRD> renal following Hyperkalemia resolved  P:   HD per renal F/u chemistry per Hypothermia protocol   GASTROINTESTINAL A:   No acute  P:   Tube feedings if not extubated PPI for SUP   HEMATOLOGIC A:  Recent hematoma L AV fistula> appears stable P:  [place] heparin > may advance to anticoagulation if OK by renal Serial CBC   INFECTIOUS A:  No acute issues P:   Trend WBC curve   ENDOCRINE A:   DM  P:   SSI  NEUROLOGIC A:   Acute encephalopathy s/p arrest -posturing on ED arrival  H/o CVAs  P:   RASS goal: 0   Hypothermia protocol with paralysis to be d/c'd today   Family Updates: daughter updated at bedside at length, we discussed code status; she desires full code   Interdisciplinary Family Meetings:     TODAY'S SUMMARY:  Plan extubation today, family desires continued full code despite overall poor prognosis.  CRITICAL CARE: The patient is critically ill with multiple organ systems failure and requires high complexity decision making for assessment and support, frequent evaluation and titration of therapies, application of advanced monitoring technologies and extensive interpretation of multiple databases. Critical Care Time devoted to patient care services described in this note is 40 minutes.    Heber CarolinaBrent Amayrany Cafaro, MD [place] [facility] Pager: [phone] Cell: [phone] If no response, call [phone]    06/29/2014, 7:40 AM

## 2014-06-29 NOTE — Progress Notes (Signed)
100 ml fentanyl gtt and 35 mls of versed drip wasted in sink. Verified by two RN's. Shearon BaloNicole Marlaina Coburn, RN.

## 2014-06-30 ENCOUNTER — Inpatient Hospital Stay (HOSPITAL_COMMUNITY): Payer: Medicare Other

## 2014-06-30 LAB — CBC
HCT: 33.7 % — ABNORMAL LOW (ref 36.0–46.0)
Hemoglobin: 11.2 g/dL — ABNORMAL LOW (ref 12.0–15.0)
MCH: 32.2 pg (ref 26.0–34.0)
MCHC: 33.2 g/dL (ref 30.0–36.0)
MCV: 96.8 fL (ref 78.0–100.0)
PLATELETS: 182 10*3/uL (ref 150–400)
RBC: 3.48 MIL/uL — ABNORMAL LOW (ref 3.87–5.11)
RDW: 15.6 % — AB (ref 11.5–15.5)
WBC: 11.2 10*3/uL — AB (ref 4.0–10.5)

## 2014-06-30 LAB — BASIC METABOLIC PANEL
ANION GAP: 16 — AB (ref 5–15)
BUN: 27 mg/dL — ABNORMAL HIGH (ref 6–23)
CALCIUM: 9.5 mg/dL (ref 8.4–10.5)
CO2: 25 meq/L (ref 19–32)
Chloride: 95 mEq/L — ABNORMAL LOW (ref 96–112)
Creatinine, Ser: 4.94 mg/dL — ABNORMAL HIGH (ref 0.50–1.10)
GFR calc Af Amer: 10 mL/min — ABNORMAL LOW (ref >90)
GFR calc non Af Amer: 8 mL/min — ABNORMAL LOW (ref >90)
Glucose, Bld: 131 mg/dL — ABNORMAL HIGH (ref 70–99)
POTASSIUM: 4.6 meq/L (ref 3.7–5.3)
Sodium: 136 mEq/L — ABNORMAL LOW (ref 137–147)

## 2014-06-30 LAB — GLUCOSE, CAPILLARY
GLUCOSE-CAPILLARY: 176 mg/dL — AB (ref 70–99)
GLUCOSE-CAPILLARY: 127 mg/dL — AB (ref 70–99)
Glucose-Capillary: 121 mg/dL — ABNORMAL HIGH (ref 70–99)
Glucose-Capillary: 150 mg/dL — ABNORMAL HIGH (ref 70–99)
Glucose-Capillary: 152 mg/dL — ABNORMAL HIGH (ref 70–99)

## 2014-06-30 MED ORDER — CHLORHEXIDINE GLUCONATE 0.12 % MT SOLN
15.0000 mL | Freq: Two times a day (BID) | OROMUCOSAL | Status: DC
  Administered 2014-06-30 – 2014-07-06 (×12): 15 mL via OROMUCOSAL
  Filled 2014-06-30 (×13): qty 15

## 2014-06-30 MED ORDER — VANCOMYCIN HCL 10 G IV SOLR
1750.0000 mg | Freq: Once | INTRAVENOUS | Status: AC
  Administered 2014-06-30: 1750 mg via INTRAVENOUS
  Filled 2014-06-30: qty 1750

## 2014-06-30 MED ORDER — DEXTROSE 5 % IV SOLN
2.0000 g | Freq: Once | INTRAVENOUS | Status: AC
  Administered 2014-06-30: 2 g via INTRAVENOUS
  Filled 2014-06-30: qty 2

## 2014-06-30 MED ORDER — CETYLPYRIDINIUM CHLORIDE 0.05 % MT LIQD
7.0000 mL | Freq: Two times a day (BID) | OROMUCOSAL | Status: DC
  Administered 2014-06-30 – 2014-07-07 (×15): 7 mL via OROMUCOSAL

## 2014-06-30 MED ORDER — VITAL HIGH PROTEIN PO LIQD
1000.0000 mL | ORAL | Status: DC
  Administered 2014-06-30: 1000 mL
  Filled 2014-06-30 (×2): qty 1000

## 2014-06-30 NOTE — Progress Notes (Signed)
[facility] Progress Note   Subjective: cxr clearing up, on vent, on HD  Filed Vitals:   06/30/14 0731 06/30/14 0800 06/30/14 0803 06/30/14 0900  BP: 145/54 151/55  156/48  Pulse: 93 90  94  Temp: 99.8 F (37.7 C)     TempSrc: Oral     Resp: 32 31  16  Height:      Weight:      SpO2: 98% 100% 100% 98%   Exam: Extubated, no verbal response  No jvd  Chest occ rales bilat  RRR 2/6 SEM no gallop  Abd obese, soft, NTND, no mass  1+ RLE edema, L BKA no edema, no UE edema  L IJ TDC intact; LUA AVF +bruit  Neuro is unresponsive  HD: MWF  F180 4h 75kg 400/800 2/2.0 Bath Heparin 5000  Aranesp 25 every 2 weeks (last 10/07), Venofer 50 mg / wed, Hectorol 5 ug TIW   Assessment:  1 Cardioresp arrest - s/p cooling protocol 2 Resp failure / pulm edema / vol excess - better, down 8 kg from admission, still up 4kg today.  Don't see any new fluid on today's film although read as "IS edema" 3 ESRD on HD 4 Access - resting infiltrated AVF, has new TDC R chest 5 Severe known CAD - not surg candidate  6 Severe known ASCVD - not candidate for procedure d/t GIB on antilplatelets  7 HTN - keeping SBP up on HD over 115-120 if possible due to hx of severe ASCVD + longstanding HTN 7 DM per prim  8 HPTH cont vit D, resume Renvela 2 ac tid when eating  9 CM EF 35%  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  11 Hyperkalemia - resolved   Plan- HD tomorrow in ICU, wean pressors to keep SBP over 115 today    Briana Gould Depass MD  pager [phone]    cell [phone]  06/30/2014, 10:11 AM     Recent Labs Lab 06/28/14 1930 06/28/14 2339 06/29/14 0411  NA 139 137 139  K 4.2 4.4 5.0  CL 97 95* 96  CO2 23 22 20   GLUCOSE 152* 146* 147*  BUN 21 24* 26*  CREATININE 3.42* 4.03* 4.46*  CALCIUM 9.2 9.6 9.6    Recent Labs Lab 06/27/14 1125  AST 49*  ALT 44*  ALKPHOS 247*  BILITOT 0.5  PROT 8.2  ALBUMIN 3.8    Recent Labs Lab 06/27/14 1125  06/27/14 1822 06/28/14 1000 06/29/14 0450   WBC 7.5  --   --  5.5 12.8*  NEUTROABS 5.0  --   --   --  11.4*  HGB 12.0  < > 13.6 10.8* 12.9  HCT 36.1  < > 40.0 31.6* 37.6  MCV 93.8  --   --  92.7 94.2  PLT 174  --   --  146* 209  < > = values in this interval not displayed. Marland Kitchen. albuterol  2.5 mg Nebulization Q4H  . antiseptic oral rinse  7 mL Mouth Rinse q12n4p  . chlorhexidine  15 mL Mouth Rinse BID  . heparin  5,000 Units Subcutaneous 3 times per day  . hydrALAZINE  10 mg Intravenous 3 times per day  . insulin aspart  1-3 Units Subcutaneous 6 times per day  . pantoprazole (PROTONIX) IV  40 mg Intravenous Daily  . prednisoLONE acetate  1 drop Both Eyes TID  . sodium chloride  10-40 mL Intracatheter Q12H   . dextrose 10 mL/hr at 06/29/14 0641  . norepinephrine (  LEVOPHED) Adult infusion 6 mcg/min (06/30/14 0526)   sodium chloride, sodium chloride, feeding supplement (NEPRO CARB STEADY), heparin, heparin, heparin, labetalol, lidocaine (PF), lidocaine-prilocaine, pentafluoroprop-tetrafluoroeth, sodium chloride

## 2014-06-30 NOTE — Progress Notes (Signed)
ANTIBIOTIC CONSULT NOTE - INITIAL  Pharmacy Consult for Ceftazidime + Vancomycin Indication: pneumonia  Allergies  Allergen Reactions  . Phenergan [Promethazine Hcl] Other (See Comments)    Causes patient to" vomit more"  . Vitamin D Analogs Rash    Patient Measurements: Height: 5' (152.4 cm) Weight: 172 lb 9.9 oz (78.3 kg) IBW/kg (Calculated) : 45.5  Vital Signs: Temp: 100.8 F (38.2 C) (10/22 1058) Temp Source: Oral (10/22 1058) BP: 130/41 mmHg (10/22 1100) Pulse Rate: 93 (10/22 1100) Intake/Output from previous day: 10/21 0701 - 10/22 0700 In: 480.9 [I.V.:480.9] Out: 1366  Intake/Output from this shift: Total I/O In: 45.9 [I.V.:45.9] Out: -   Labs:  Recent Labs  06/27/14 1125  06/27/14 1822  06/28/14 1000  06/28/14 1930 06/28/14 2339 06/29/14 0411 06/29/14 0450  WBC 7.5  --   --   --  5.5  --   --   --   --  12.8*  HGB 12.0  < > 13.6  --  10.8*  --   --   --   --  12.9  PLT 174  --   --   --  146*  --   --   --   --  209  CREATININE 10.75*  < > 4.10*  < >  --   < > 3.42* 4.03* 4.46*  --   < > = values in this interval not displayed. Estimated Creatinine Clearance: 11.2 ml/min (by C-G formula based on Cr of 4.46). No results found for this basename: VANCOTROUGH, Leodis BinetVANCOPEAK, VANCORANDOM, GENTTROUGH, GENTPEAK, GENTRANDOM, TOBRATROUGH, TOBRAPEAK, TOBRARND, AMIKACINPEAK, AMIKACINTROU, AMIKACIN,  in the last 72 hours   Microbiology: Recent Results (from the past 720 hour(s))  MRSA PCR SCREENING     Status: None   Collection Time    06/27/14  2:18 PM      Result Value Ref Range Status   MRSA by PCR NEGATIVE  NEGATIVE Final   Comment:            The GeneXpert MRSA Assay (FDA     approved for NASAL specimens     only), is one component of a     comprehensive MRSA colonization     surveillance program. It is not     intended to diagnose MRSA     infection nor to guide or     monitor treatment for     MRSA infections.    Medical History: Past Medical  History  Diagnosis Date  . PAD (peripheral artery disease)     a. L BKA  . Type II diabetes mellitus   . Anemia   . History of blood transfusion 2002    "related to amputation" (08/13/2013)  . End stage renal disease on dialysis     [facility]" (08/13/2013)  . DVT (deep venous thrombosis)     a. R IJ DVT 08/2013.  Marland Kitchen. CVA (cerebral infarction)     a. MRI 08/2013: possible left motor strip infarct with remote pontine infarct.  . Pulmonary HTN     a. Cath 2011 - mod to severe pulmonary hypertension. b. Echo 08/2013: PAP 45mmHg (mild-mod increased).  . Thrombocytopenia   . CAD (coronary artery disease)     a. Severe diffuse diabetic disease by cath 2011, for med rx, not a candidate for CABG or PCI at that time.  . Aortic stenosis     mild  . Syncope     a. 2011, etiology never clear.  .Marland Kitchen  Carotid artery disease     a. Dopp 16/109612/2014: 1-39% LICA< 60-79% RICA borderline >80%.  . Cerebrovascular disease     a. 08/2013 MRA brain: multiple bilateral moderate and high grade intracranial stenoses  . Cardiac arrest 08/14/13    a. Possible arrest in setting of hypotension during dialysis with possible arrest requiring brief CPR, not intubated and was fully intact after episode. Suspected due to low cerebral perfusion in setting of hypotension. Unclear if any arrhythmia.  . End stage renal disease on dialysis   . Peripheral arterial occlusive disease   . Hypertension   . Diabetes mellitus type 2, uncontrolled, with complications   . DVT (deep venous thrombosis)      Right internal jugular vein  . CAD (coronary artery disease), native coronary artery 2011    Diffuse distal vessel CAD noted at cardiac catheterization  . MI (myocardial infarction)   . Stroke     Medications:  Scheduled:  . albuterol  2.5 mg Nebulization Q4H  . antiseptic oral rinse  7 mL Mouth Rinse q12n4p  . cefTAZidime (FORTAZ)  IV  2 g Intravenous Once  . chlorhexidine  15 mL Mouth Rinse BID  .  heparin  5,000 Units Subcutaneous 3 times per day  . insulin aspart  1-3 Units Subcutaneous 6 times per day  . pantoprazole (PROTONIX) IV  40 mg Intravenous Daily  . prednisoLONE acetate  1 drop Both Eyes TID  . sodium chloride  10-40 mL Intracatheter Q12H  . vancomycin  1,750 mg Intravenous Once   Infusions:  . norepinephrine (LEVOPHED) Adult infusion 3 mcg/min (06/30/14 1106)   Assessment: 68 yo F admitted 06/27/2014 s/p arrest at out patient surgical center after placement of tunneled HD cath. Patient is ESRD on HD now to start IV abx empirically for HCAP. Tmax 100.1, WBC elevated to 12.8, extubated on 10/21.   Goal of Therapy:  Pre-HD level: 15-25 mcg/mL Post-HD level: 5-15 mcg/mL   Plan:  - Start Ceftazidime 2 gm IV x 1 today - Vanc 1750 mg IV x 1 today - Subsequent doses will be ordered based on HD tolerance and schedule - Monitor temp, WBC, C&S, vanc levels as indicated

## 2014-06-30 NOTE — Care Management Note (Addendum)
    Page 1 of 1   07/04/2014     9:13:47 AM CARE MANAGEMENT NOTE 07/04/2014  Patient:  Briana Gould,Briana Gould   Account Number:  [id number]  Date Initiated:  06/30/2014  Documentation initiated by:  Junius CreamerWELL,DEBBIE  Subjective/Objective Assessment:   adm w cardiac arrest, esrd hd     Action/Plan:   lives w fam   Anticipated DC Date:     Anticipated DC Plan:           Choice offered to / List presented to:             Status of service:   Medicare Important Message given?  YES (If response is "NO", the following Medicare IM given date fields will be blank) Date Medicare IM given:  06/30/2014 Medicare IM given by:  Junius CreamerWELL,DEBBIE Date Additional Medicare IM given:  07/04/2014 Additional Medicare IM given [facility]:  [facility]  Discharge Disposition:    Per UR Regulation:  Reviewed for med. necessity/level of care/duration of stay  If discussed at Long Length of Stay Meetings, dates discussed:   07/05/2014    Comments:

## 2014-06-30 NOTE — Procedures (Signed)
ELECTROENCEPHALOGRAM REPORT   Patient: Briana Gould       Room #: 1O102H04 EEG No. ID: [id number] Age: 68 y.o.        Sex: female Referring Physician: Vassie LollAlva Report Date:  06/30/2014        Interpreting Physician: Thana FarrEYNOLDS,Jamason Peckham D  History: [name] is an 68 y.o. female s/p cardiopulmonary arrest after catheter placement.  Patient placed under hypothermia protocol and now extubated.    Medications:  Scheduled: . albuterol  2.5 mg Nebulization Q4H  . antiseptic oral rinse  7 mL Mouth Rinse q12n4p  . chlorhexidine  15 mL Mouth Rinse BID  . heparin  5,000 Units Subcutaneous 3 times per day  . insulin aspart  1-3 Units Subcutaneous 6 times per day  . pantoprazole (PROTONIX) IV  40 mg Intravenous Daily  . prednisoLONE acetate  1 drop Both Eyes TID  . sodium chloride  10-40 mL Intracatheter Q12H    Conditions of Recording:  This is a 16 channel EEG carried out with the patient in the unresponsive state.   Description:  The background activity is discontinuous. It consists of bursts of intermittent periodic discharges of triphasic morphology alternating with periods of attenuation.  On most occasions these discharges were generalized.  On other occasions and less commonly these discharges were most predominantly from the right hemisphere.  The burst activity lasts up to 3 seconds.  The periods of attenuation last up to 5 seconds. This discontinuous activity is maintained throughout the tracing. There are times though when the activity noted between periods of attenuation were not triphasic in morphology but were periods of poorly organized theta activity that was diffusely distributed.  Despite the fact that the patient was stimulated on two separate occasions during the recording no change in the background activity was noted.   There was no evidence of normal sleep.   Hyperventilation and intermittent photic stimulation were not performed.   IMPRESSION: This is an abnormal  electroencephalogram secondary to a burst suppression rhythm with periodic triphasic discharges.  This is suggestive of a diffuse cerebral disturbance that may have a right hemispheric predominance.  Etiology is nonspecific and clinical correlation is recommended.      Thana FarrLeslie Loria Lacina, MD [facility] [phone] 06/30/2014, 4:58 PM

## 2014-06-30 NOTE — Progress Notes (Signed)
PULMONARY / CRITICAL CARE MEDICINE   Name: Briana Gould MRN: [id number] DOB: 1946-07-31    ADMISSION DATE:  06/27/2014 CONSULTATION DATE:  10/19  REFERRING MD :  Rosalia Hammersay   CHIEF COMPLAINT:  Cardiac arrest   INITIAL PRESENTATION:  68 year old female admitted on 10/19 s/p cardiopulmonary arrest after placement of tunneled HD cath in out-pt surgical center. Pt had hematoma involving her left AV fistula making HD access difficult. No HD since 10/14. Per surgical center pt not on monitor. Noted to not be breathing ~ 5 minutes s/p procedure. Oral airway placed. Assisted ventilation initiated. No pulses noted. Got CPR x 2 minutes then ROSC noted. Presented to the ER unresponsive w/ ineffective ventilation and posturing. PCCM asked to admit.   STUDIES:  CT head 10/19>>>NAICP Echo 10/19>>> LVEF 45-50%, mild LVF, PA pressure 34mmHg, RV moderately dilated  SIGNIFICANT EVENTS: 10/19>10/20 hypothermia protocol 10/20 HD 10/21 rewarmed, making some purposeful movements, extubated   SUBJECTIVE:  Extubated yesterday, was able to follow commands but was non-verbal, this morning she is more encephalopathic, low grade temp   VITAL SIGNS: Temp:  [98.1 F (36.7 C)-100.1 F (37.8 C)] 99.8 F (37.7 C) (10/22 0731) Pulse Rate:  [35-102] 94 (10/22 0900) Resp:  [11-36] 16 (10/22 0900) BP: (82-168)/(38-86) 156/48 mmHg (10/22 0900) SpO2:  [97 %-100 %] 98 % (10/22 0900) FiO2 (%):  [40 %] 40 % (10/21 1119) Weight:  [78.3 kg (172 lb 9.9 oz)] 78.3 kg (172 lb 9.9 oz) (10/21 1115) HEMODYNAMICS:   VENTILATOR SETTINGS: Vent Mode:  [-] PSV;CPAP FiO2 (%):  [40 %] 40 % PEEP:  [5 cmH20] 5 cmH20 Pressure Support:  [5 cmH20] 5 cmH20 INTAKE / OUTPUT:  Intake/Output Summary (Last 24 hours) at 06/30/14 1040 Last data filed at 06/30/14 1000  Gross per 24 hour  Intake 422.87 ml  Output   1366 ml  Net -943.13 ml    PHYSICAL EXAMINATION: General:  Asleep HEENT: NCAT, left pupil s/p surgery, r pupil  round and reactive PULM: rhonchi bilaterally CV: Irreg irreg, no mgr AB: BS positive, soft Ext: warm, s/p L AKA Neuro: asleep,  Arouses to touch, grimace, moves all four extrem to pain  LABS:  CBC  Recent Labs Lab 06/27/14 1125  06/27/14 1822 06/28/14 1000 06/29/14 0450  WBC 7.5  --   --  5.5 12.8*  HGB 12.0  < > 13.6 10.8* 12.9  HCT 36.1  < > 40.0 31.6* 37.6  PLT 174  --   --  146* 209  < > = values in this interval not displayed. Coag's  Recent Labs Lab 06/27/14 1420 06/27/14 2032  APTT 34 36  INR 1.31 1.18   BMET  Recent Labs Lab 06/28/14 1930 06/28/14 2339 06/29/14 0411  NA 139 137 139  K 4.2 4.4 5.0  CL 97 95* 96  CO2 23 22 20   BUN 21 24* 26*  CREATININE 3.42* 4.03* 4.46*  GLUCOSE 152* 146* 147*   Electrolytes  Recent Labs Lab 06/28/14 1930 06/28/14 2339 06/29/14 0411  CALCIUM 9.2 9.6 9.6   Sepsis Markers No results found for this basename: LATICACIDVEN, PROCALCITON, O2SATVEN,  in the last 168 hours ABG  Recent Labs Lab 06/27/14 1127 06/27/14 1333  PHART 7.275* 7.304*  PCO2ART 42.1 38.5  PO2ART 387.0* 129.0*   Liver Enzymes  Recent Labs Lab 06/27/14 1125  AST 49*  ALT 44*  ALKPHOS 247*  BILITOT 0.5  ALBUMIN 3.8   Cardiac Enzymes  Recent Labs Lab 06/27/14 1125  TROPONINI <0.30   Glucose  Recent Labs Lab 06/29/14 1125 06/29/14 1651 06/29/14 1936 06/30/14 0010 06/30/14 0338 06/30/14 0856  GLUCAP 99 119* 100* 150* 176* 152*    Imaging Dg Chest Port 1 View  06/29/2014   CLINICAL DATA:  Acute respiratory failure. History of cardiac arrest.  EXAM: PORTABLE CHEST - 1 VIEW  COMPARISON:  06/28/2014  FINDINGS: Endotracheal tube is 1.7 cm above the carina. Nasogastric tube extends into the abdomen. There is a left jugular dialysis catheter. Catheter tip in the right atrium. Improved aeration and decreased edema compared to the previous examination. Cardiac pad overlying the left side of the chest. Stable consolidation at  the left lung base most compatible with atelectasis but cannot exclude pleural fluid. Atherosclerotic calcifications at the aortic arch. Heart size remains enlarged.  IMPRESSION: Decreased pulmonary edema.  Persistent opacification at the left lung base as described.  Support apparatuses as described.   Electronically Signed   By: Richarda OverlieAdam  Henn M.D.   On: 06/29/2014 07:45     ASSESSMENT / PLAN:  PULMONARY OETT 10/19 >> 10/21 A:  Acute hypoxic respiratory failure s/p cardiac arrest  > resolved HCAP P:   Aspiration precautions See ID Daily CXR  CARDIOVASCULAR CVL Left IJ tunneled HD cath 10/19 Left femoral triple lumen 10/19 > A:  Cardiopulmonary arrest, PEA most likely due to chronic baseline hypotension and pulmonary hypertension Shock> multifactorial, has chronic hypotension, ? sepsis Pulmonary edema  Afib, chronic H/o diastolic HF , pulmonary hypertension -RVSP 34 on echo Severe known CAD  P:  Levophed for SBP > 115 Volume removal w/ HD tomorrow No anticoagulation considering hematoma left arm and history of GI bleed  RENAL A:   ESRD> renal following Hyperkalemia resolved  P:   HD per renal  GASTROINTESTINAL A:   No acute  P:   Place panda if mental status doesn't improve PPI for SUP   HEMATOLOGIC A:  Recent hematoma L AV fistula>  stable P:  [place] heparin > may advance to anticoagulation if OK by renal Serial CBC   INFECTIOUS A:  HCAP 10/22 blood culture >> 10/22 vanc >> 10/22 ceftax >> P:   Plan 7 days HCAP therapy   ENDOCRINE A:   DM  P:   SSI/CBG Stop D10  NEUROLOGIC A:   Acute encephalopathy s/p arrest - overall picture worrisome for anoxic encephalopathy, but 10/22 exam worse likely due to delirium from HCAP H/o CVAs  P:   CT head now EEG no No sedating medications   Family Updates: daughter and brother updated at bedside at length again 10/22, we discussed code status; she still desires full code but is leaning towards DNR depending  on mental status   Interdisciplinary Family Meetings:    TODAY'S SUMMARY:  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 Still full code.  CRITICAL CARE: The patient is critically ill with multiple organ systems failure and requires high complexity decision making for assessment and support, frequent evaluation and titration of therapies, application of advanced monitoring technologies and extensive interpretation of multiple databases. Critical Care Time devoted to patient care services described in this note is 40 minutes.    Heber CarolinaBrent McQuaid, MD [facility] Pager: [phone] Cell: [phone] If no response, call [phone]    06/30/2014, 10:40 AM

## 2014-06-30 NOTE — Progress Notes (Signed)
Bedside EEG completed, results pending. 

## 2014-07-01 ENCOUNTER — Inpatient Hospital Stay (HOSPITAL_COMMUNITY): Payer: Medicare Other

## 2014-07-01 ENCOUNTER — Other Ambulatory Visit: Payer: Self-pay

## 2014-07-01 DIAGNOSIS — J189 Pneumonia, unspecified organism: Secondary | ICD-10-CM

## 2014-07-01 LAB — BASIC METABOLIC PANEL
Anion gap: 22 — ABNORMAL HIGH (ref 5–15)
BUN: 42 mg/dL — AB (ref 6–23)
CO2: 21 meq/L (ref 19–32)
Calcium: 9.7 mg/dL (ref 8.4–10.5)
Chloride: 96 mEq/L (ref 96–112)
Creatinine, Ser: 6.39 mg/dL — ABNORMAL HIGH (ref 0.50–1.10)
GFR calc Af Amer: 7 mL/min — ABNORMAL LOW (ref >90)
GFR calc non Af Amer: 6 mL/min — ABNORMAL LOW (ref >90)
GLUCOSE: 152 mg/dL — AB (ref 70–99)
Potassium: 5 mEq/L (ref 3.7–5.3)
SODIUM: 139 meq/L (ref 137–147)

## 2014-07-01 LAB — CBC WITH DIFFERENTIAL/PLATELET
Basophils Absolute: 0 10*3/uL (ref 0.0–0.1)
Basophils Relative: 0 % (ref 0–1)
EOS ABS: 0 10*3/uL (ref 0.0–0.7)
EOS PCT: 0 % (ref 0–5)
HEMATOCRIT: 31.1 % — AB (ref 36.0–46.0)
Hemoglobin: 10.1 g/dL — ABNORMAL LOW (ref 12.0–15.0)
LYMPHS ABS: 1.2 10*3/uL (ref 0.7–4.0)
Lymphocytes Relative: 13 % (ref 12–46)
MCH: 30.8 pg (ref 26.0–34.0)
MCHC: 32.5 g/dL (ref 30.0–36.0)
MCV: 94.8 fL (ref 78.0–100.0)
MONO ABS: 0.8 10*3/uL (ref 0.1–1.0)
MONOS PCT: 8 % (ref 3–12)
Neutro Abs: 7.5 10*3/uL (ref 1.7–7.7)
Neutrophils Relative %: 79 % — ABNORMAL HIGH (ref 43–77)
Platelets: 165 10*3/uL (ref 150–400)
RBC: 3.28 MIL/uL — ABNORMAL LOW (ref 3.87–5.11)
RDW: 15.6 % — ABNORMAL HIGH (ref 11.5–15.5)
WBC: 9.5 10*3/uL (ref 4.0–10.5)

## 2014-07-01 LAB — GLUCOSE, CAPILLARY
GLUCOSE-CAPILLARY: 150 mg/dL — AB (ref 70–99)
GLUCOSE-CAPILLARY: 163 mg/dL — AB (ref 70–99)
GLUCOSE-CAPILLARY: 220 mg/dL — AB (ref 70–99)
Glucose-Capillary: 137 mg/dL — ABNORMAL HIGH (ref 70–99)
Glucose-Capillary: 164 mg/dL — ABNORMAL HIGH (ref 70–99)
Glucose-Capillary: 174 mg/dL — ABNORMAL HIGH (ref 70–99)
Glucose-Capillary: 221 mg/dL — ABNORMAL HIGH (ref 70–99)
Glucose-Capillary: 194 mg/dL — ABNORMAL HIGH (ref 70–99)

## 2014-07-01 MED ORDER — PRO-STAT SUGAR FREE PO LIQD
30.0000 mL | Freq: Every morning | ORAL | Status: DC
  Administered 2014-07-01 – 2014-07-06 (×4): 30 mL via ORAL
  Filled 2014-07-01 (×6): qty 30

## 2014-07-01 MED ORDER — PNEUMOCOCCAL VAC POLYVALENT 25 MCG/0.5ML IJ INJ
0.5000 mL | INJECTION | INTRAMUSCULAR | Status: DC
  Filled 2014-07-01: qty 0.5

## 2014-07-01 MED ORDER — INSULIN ASPART 100 UNIT/ML ~~LOC~~ SOLN
0.0000 [IU] | SUBCUTANEOUS | Status: DC
  Administered 2014-07-01 (×2): 2 [IU] via SUBCUTANEOUS
  Administered 2014-07-01: 3 [IU] via SUBCUTANEOUS
  Administered 2014-07-01: 2 [IU] via SUBCUTANEOUS
  Administered 2014-07-02 (×2): 3 [IU] via SUBCUTANEOUS
  Administered 2014-07-02 (×2): 2 [IU] via SUBCUTANEOUS
  Administered 2014-07-03 (×2): 1 [IU] via SUBCUTANEOUS
  Administered 2014-07-03 (×2): 3 [IU] via SUBCUTANEOUS
  Administered 2014-07-03 – 2014-07-04 (×4): 2 [IU] via SUBCUTANEOUS
  Administered 2014-07-04 (×3): 1 [IU] via SUBCUTANEOUS
  Administered 2014-07-05: 2 [IU] via SUBCUTANEOUS
  Administered 2014-07-05 (×3): 1 [IU] via SUBCUTANEOUS
  Administered 2014-07-06: 2 [IU] via SUBCUTANEOUS
  Administered 2014-07-06: 1 [IU] via SUBCUTANEOUS
  Administered 2014-07-06: 2 [IU] via SUBCUTANEOUS
  Administered 2014-07-06 – 2014-07-07 (×2): 1 [IU] via SUBCUTANEOUS
  Administered 2014-07-07 (×2): 2 [IU] via SUBCUTANEOUS

## 2014-07-01 MED ORDER — VITAL AF 1.2 CAL PO LIQD
1000.0000 mL | ORAL | Status: DC
  Filled 2014-07-01 (×2): qty 1000

## 2014-07-01 MED ORDER — DEXTROSE 5 % IV SOLN
5.0000 mg/h | INTRAVENOUS | Status: DC
  Administered 2014-07-01 – 2014-07-02 (×2): 5 mg/h via INTRAVENOUS
  Filled 2014-07-01: qty 100

## 2014-07-01 MED ORDER — NEPRO/CARBSTEADY PO LIQD
1000.0000 mL | ORAL | Status: DC
  Administered 2014-07-01 – 2014-07-03 (×3): 1000 mL
  Filled 2014-07-01 (×6): qty 1000

## 2014-07-01 NOTE — Progress Notes (Addendum)
Pt. Rhythm change to Afib RVR rate into the 180s. Rhythm went in and out for a few minutes. Pt currently in Afib RVR. Physician notified. EKG obtained. Pt responds to pain. BP stable. Breathing is more labored at this time lung sounds are clear. Zoll pads placed.

## 2014-07-01 NOTE — Progress Notes (Signed)
CRITICAL VALUE ALERT  Critical value received:  Aerobic blood cultures-positive for gram cocci in cluster   Date of notification:  07/01/2014   Time of notification:  1500  Critical value read back:  Nurse who received alert:  Fritzi Mandeshris Ashlen Kiger RN  MD notified (1st page):  Dr. Sung AmabileSimonds  Time of first page:  1500   MD notified (2nd page):  Time of second page:  Responding MD:    Time MD responded:

## 2014-07-01 NOTE — Progress Notes (Signed)
[facility] Progress Note   Subjective: Moving more today, not verbalizing yet or responding to commands  Filed Vitals:   07/01/14 0500 07/01/14 0600 07/01/14 0700 07/01/14 0730  BP: 113/43 108/38 121/48 125/52  Pulse: 81 81 78   Temp:    98.7 F (37.1 C)  TempSrc:    Oral  Resp: 29 34 29   Height:      Weight:      SpO2: 100% 98% 98% 99%   Exam: Extubated, lying upright in bed No jvd  Chest coarse BS at bases RRR 2/6 SEM no gallop  Abd obese, soft, NTND, no mass  1+ RLE edema, L BKA no edema, no UE edema  L IJ TDC intact; LUA AVF +bruit  Neuro is unresponsive  HD: MWF  F180 4h 75kg 400/800 2/2.0 [place] Heparin 5000  Aranesp 25 every 2 weeks (last 10/07), Venofer 50 mg / wed, Hectorol 5 ug TIW   Assessment:  1 Cardioresp arrest - s/p cooling protocol 2 AMS - hypoxic injury vs delirium, may be improving some 3 Vol excess / pulm edema - resolved, below dry wt today 4 PNA - on empiric abx 5 ESRD on HD 6 Access - resting infiltrated AVF, has new TDC R chest 7 Severe known CAD - not surg candidate  8 Severe known ASCVD - not candidate for procedure d/t GIB on antilplatelets  9 HTN - keeping SBP up on HD over 115-120 as much as possible due to hx of severe ASCVD + longstanding HTN 10 DM per prim  11 HPTH cont vit D, resume Renvela 2 ac tid when eating  12 CM EF 35%  13 Hx recurrent card arrest   Plan- hold HD today as she is below dry wt and has had HD 3 days in a row; plan on short Rx tomorrow then on Monday.     Vinson Moselleob Corydon Schweiss MD  pager [phone]    cell [phone]  07/01/2014, 10:20 AM     Recent Labs Lab 06/29/14 0411 06/30/14 1045 07/01/14 0445  NA 139 136* 139  K 5.0 4.6 5.0  CL 96 95* 96  CO2 20 25 21   GLUCOSE 147* 131* 152*  BUN 26* 27* 42*  CREATININE 4.46* 4.94* 6.39*  CALCIUM 9.6 9.5 9.7    Recent Labs Lab 06/27/14 1125  AST 49*  ALT 44*  ALKPHOS 247*  BILITOT 0.5  PROT 8.2  ALBUMIN 3.8    Recent Labs Lab  06/27/14 1125  06/29/14 0450 06/30/14 1045 07/01/14 0445  WBC 7.5  < > 12.8* 11.2* 9.5  NEUTROABS 5.0  --  11.4*  --  7.5  HGB 12.0  < > 12.9 11.2* 10.1*  HCT 36.1  < > 37.6 33.7* 31.1*  MCV 93.8  < > 94.2 96.8 94.8  PLT 174  < > 209 182 165  < > = values in this interval not displayed. Marland Kitchen. albuterol  2.5 mg Nebulization Q4H  . antiseptic oral rinse  7 mL Mouth Rinse q12n4p  . chlorhexidine  15 mL Mouth Rinse BID  . feeding supplement (PRO-STAT SUGAR FREE 64)  30 mL Oral q morning - 10a  . heparin  5,000 Units Subcutaneous 3 times per day  . insulin aspart  1-3 Units Subcutaneous 6 times per day  . pantoprazole (PROTONIX) IV  40 mg Intravenous Daily  . prednisoLONE acetate  1 drop Both Eyes TID  . sodium chloride  10-40 mL Intracatheter Q12H   . feeding supplement (  NEPRO CARB STEADY)    . norepinephrine (LEVOPHED) Adult infusion Stopped (07/01/14 0300)   labetalol, sodium chloride

## 2014-07-01 NOTE — Progress Notes (Addendum)
INITIAL NUTRITION ASSESSMENT  DOCUMENTATION CODES Per approved criteria  -Obesity Unspecified   INTERVENTION: Initiate Nepro 1.8 @ 40 ml/hr via Panda tube. Prostat once daily.   Tube feeding regimen provides 1828 kcal (100% of needs), 93 grams of protein, and 1166 ml of H2O.   NUTRITION DIAGNOSIS: Inadequate oral intake related to inability to eat as evidenced by NPO.   Goal: Pt to meet >/= 90% of their estimated nutrition needs   Monitor:  Weight trend, NPO, toleration of TF, labs, I/O's  Reason for Assessment: Consult for TF initiation and management  68 y.o. female  Admitting Dx: <principal problem not specified>  ASSESSMENT: Briana Gould is an 68 y.o. female s/p cardiopulmonary arrest after catheter placement. Patient placed under hypothermia protocol (10/19-10/20) and now extubated (10/21.)  - Pt with no signs of fat or muscle wasting.  - Pt has ESRD on HD.  - Pt with Panda tube, tolerating TF at this time.  - Pt's weight has decreased from 190 to 161 lbs since admission, some of this is fluid loss due to pt in negative fluid balance since admission. Per chart history, pt's usual body weight is around 175-180 lbs.   Patient has NG tube in place with tip of tube in stomach. Vital HP is infusing @ 40 ml/hr. Tube feeding regimen currently providing 960 kcal, 84 grams protein, and 806 ml H2O.   Labs: CBGs: 121-164 Na and K WNL  Height: Ht Readings from Last 1 Encounters:  06/27/14 5' (1.524 m)    Weight: Wt Readings from Last 1 Encounters:  07/01/14 161 lb 9.6 oz (73.3 kg)    Ideal Body Weight: 100 lb  % Ideal Body Weight: 161%  Wt Readings from Last 10 Encounters:  07/01/14 161 lb 9.6 oz (73.3 kg)  01/06/14 180 lb (81.647 kg)  12/18/13 171 lb 4.8 oz (77.7 kg)  11/24/13 181 lb 3.5 oz (82.2 kg)  11/24/13 181 lb 3.5 oz (82.2 kg)  10/25/13 175 lb 4.3 oz (79.5 kg)  10/15/13 178 lb 12.7 oz (81.1 kg)  10/15/13 178 lb 12.7 oz (81.1 kg)  10/15/13 178 lb  12.7 oz (81.1 kg)  10/15/13 178 lb 12.7 oz (81.1 kg)    Usual Body Weight: 175-180 lbs  % Usual Body Weight: 89%  BMI:  Body mass index is 31.56 kg/(m^2).  Estimated Nutritional Needs: Kcal: 1800-2000 Protein: 90-100 g Fluid: Per MD  Skin: skin tear on groin  Diet Order: NPO  EDUCATION NEEDS: -Education not appropriate at this time   Intake/Output Summary (Last 24 hours) at 07/01/14 0928 Last data filed at 07/01/14 0700  Gross per 24 hour  Intake 826.04 ml  Output      0 ml  Net 826.04 ml    Last BM: 10/22   Labs:   Recent Labs Lab 06/29/14 0411 06/30/14 1045 07/01/14 0445  NA 139 136* 139  K 5.0 4.6 5.0  CL 96 95* 96  CO2 20 25 21   BUN 26* 27* 42*  CREATININE 4.46* 4.94* 6.39*  CALCIUM 9.6 9.5 9.7  GLUCOSE 147* 131* 152*    CBG (last 3)   Recent Labs  06/30/14 2334 07/01/14 0405 07/01/14 0730  GLUCAP 163* 150* 164*    Scheduled Meds: . albuterol  2.5 mg Nebulization Q4H  . antiseptic oral rinse  7 mL Mouth Rinse q12n4p  . chlorhexidine  15 mL Mouth Rinse BID  . feeding supplement (VITAL HIGH PROTEIN)  1,000 mL Per Tube Q24H  . heparin  5,000 Units Subcutaneous 3 times per day  . insulin aspart  1-3 Units Subcutaneous 6 times per day  . pantoprazole (PROTONIX) IV  40 mg Intravenous Daily  . prednisoLONE acetate  1 drop Both Eyes TID  . sodium chloride  10-40 mL Intracatheter Q12H    Continuous Infusions: . norepinephrine (LEVOPHED) Adult infusion Stopped (07/01/14 0300)    Past Medical History  Diagnosis Date  . PAD (peripheral artery disease)     a. L BKA  . Type II diabetes mellitus   . Anemia   . History of blood transfusion 2002    "related to amputation" (08/13/2013)  . End stage renal disease on dialysis     [facility]" (08/13/2013)  . DVT (deep venous thrombosis)     a. R IJ DVT 08/2013.  Marland Kitchen. CVA (cerebral infarction)     a. MRI 08/2013: possible left motor strip infarct with remote pontine infarct.   . Pulmonary HTN     a. Cath 2011 - mod to severe pulmonary hypertension. b. Echo 08/2013: PAP 45mmHg (mild-mod increased).  . Thrombocytopenia   . CAD (coronary artery disease)     a. Severe diffuse diabetic disease by cath 2011, for med rx, not a candidate for CABG or PCI at that time.  . Aortic stenosis     mild  . Syncope     a. 2011, etiology never clear.  . Carotid artery disease     a. Dopp 98/119112/2014: 1-39% LICA< 60-79% RICA borderline >80%.  . Cerebrovascular disease     a. 08/2013 MRA brain: multiple bilateral moderate and high grade intracranial stenoses  . Cardiac arrest 08/14/13    a. Possible arrest in setting of hypotension during dialysis with possible arrest requiring brief CPR, not intubated and was fully intact after episode. Suspected due to low cerebral perfusion in setting of hypotension. Unclear if any arrhythmia.  . End stage renal disease on dialysis   . Peripheral arterial occlusive disease   . Hypertension   . Diabetes mellitus type 2, uncontrolled, with complications   . DVT (deep venous thrombosis)      Right internal jugular vein  . CAD (coronary artery disease), native coronary artery 2011    Diffuse distal vessel CAD noted at cardiac catheterization  . MI (myocardial infarction)   . Stroke     Past Surgical History  Procedure Laterality Date  . Av fistula placement Left 2011    upper arm  . Below knee leg amputation Left 2002  . Tonsillectomy    . Cataract extraction w/ intraocular lens  implant, bilateral Bilateral 2012-2014  . Esophagogastroduodenoscopy N/A 08/30/2013    Procedure: ESOPHAGOGASTRODUODENOSCOPY (EGD);  Surgeon: Theda BelfastPatrick D Hung, MD;  [facility];  Service: Endoscopy;  Laterality: N/A;  . Below knee leg amputation Left   . Av fistula placement Left     Emmaline KluverHaley Freddy Spadafora RD, LDN

## 2014-07-01 NOTE — Progress Notes (Signed)
eLink Physician-Brief Progress Note Patient Name: Briana Gould DOB: July 06, 1946 MRN: [id number]   Date of Service  07/01/2014  HPI/Events of Note  Patient developed afib with RVR.  BP stable.  eICU Interventions  Start cardizem drip     Intervention Category Intermediate Interventions: Arrhythmia - evaluation and management  Henry RusselSMITH, Maurina Fawaz, P 07/01/2014, 9:53 PM

## 2014-07-01 NOTE — Progress Notes (Signed)
PULMONARY / CRITICAL CARE MEDICINE   Name: Briana Gould MRN: [id number] DOB: 1946/08/07    ADMISSION DATE:  06/27/2014 CONSULTATION DATE:  10/19  REFERRING MD :  Rosalia Hammersay   CHIEF COMPLAINT:  Cardiac arrest   INITIAL PRESENTATION:  68 year old female admitted on 10/19 s/p cardiopulmonary arrest after placement of tunneled HD cath in out-pt surgical center. Pt had hematoma involving her left AV fistula making HD access difficult. No HD since 10/14. Per surgical center pt not on monitor. Noted to not be breathing ~ 5 minutes s/p procedure. Oral airway placed. Assisted ventilation initiated. No pulses noted. Got CPR x 2 minutes then ROSC noted. Presented to the ER unresponsive w/ ineffective ventilation and posturing. PCCM asked to admit.   STUDIES:  CT head 10/19>>>NAICP Echo 10/19>>> LVEF 45-50%, mild LVF, PA pressure 34mmHg, RV moderately dilated CT head 10/22 > NAICP EEG 10/22 >> burst suppression rhythm with periodic triphasic charges  SIGNIFICANT EVENTS: 10/19>10/20 hypothermia protocol 10/20 HD 10/21 rewarmed, making some purposeful movements, extubated 10/22 Fever, likely LLL HCAP, panda placed   SUBJECTIVE:  Fever overnight, LLL HCAP, panda placed, cough, gag in placed   VITAL SIGNS: Temp:  [98.7 F (37.1 C)-101.4 F (38.6 C)] 98.7 F (37.1 C) (10/23 0730) Pulse Rate:  [78-94] 78 (10/23 0700) Resp:  [15-44] 29 (10/23 0700) BP: (102-141)/(13-88) 125/52 mmHg (10/23 0730) SpO2:  [92 %-100 %] 99 % (10/23 0730) FiO2 (%):  [99 %] 99 % (10/23 0801) Weight:  [73.3 kg (161 lb 9.6 oz)] 73.3 kg (161 lb 9.6 oz) (10/23 0452) HEMODYNAMICS:   VENTILATOR SETTINGS: Vent Mode:  [-]  FiO2 (%):  [99 %] 99 % INTAKE / OUTPUT:  Intake/Output Summary (Last 24 hours) at 07/01/14 1013 Last data filed at 07/01/14 0700  Gross per 24 hour  Intake 811.34 ml  Output      0 ml  Net 811.34 ml    PHYSICAL EXAMINATION: General:  Arouses to voice, touch HEENT: NCAT, left pupil s/p  surgery, r pupil round and reactive PULM: crackles left base, otherwise clear CV: Irreg irreg, no mgr AB: BS positive, soft Ext: warm, s/p L AKA Neuro: arouses to touch, tracks with eyes, non-verbal, grimace to pain  LABS:  CBC  Recent Labs Lab 06/29/14 0450 06/30/14 1045 07/01/14 0445  WBC 12.8* 11.2* 9.5  HGB 12.9 11.2* 10.1*  HCT 37.6 33.7* 31.1*  PLT 209 182 165   Coag's  Recent Labs Lab 06/27/14 1420 06/27/14 2032  APTT 34 36  INR 1.31 1.18   BMET  Recent Labs Lab 06/29/14 0411 06/30/14 1045 07/01/14 0445  NA 139 136* 139  K 5.0 4.6 5.0  CL 96 95* 96  CO2 20 25 21   BUN 26* 27* 42*  CREATININE 4.46* 4.94* 6.39*  GLUCOSE 147* 131* 152*   Electrolytes  Recent Labs Lab 06/29/14 0411 06/30/14 1045 07/01/14 0445  CALCIUM 9.6 9.5 9.7   Sepsis Markers No results found for this basename: LATICACIDVEN, PROCALCITON, O2SATVEN,  in the last 168 hours ABG  Recent Labs Lab 06/27/14 1127 06/27/14 1333  PHART 7.275* 7.304*  PCO2ART 42.1 38.5  PO2ART 387.0* 129.0*   Liver Enzymes  Recent Labs Lab 06/27/14 1125  AST 49*  ALT 44*  ALKPHOS 247*  BILITOT 0.5  ALBUMIN 3.8   Cardiac Enzymes  Recent Labs Lab 06/27/14 1125  TROPONINI <0.30   Glucose  Recent Labs Lab 06/30/14 1101 06/30/14 1616 06/30/14 2048 06/30/14 2334 07/01/14 0405 07/01/14 0730  GLUCAP 127*  121* 137* 163* 150* 164*    Imaging Dg Abd 1 View  06/30/2014   CLINICAL DATA:  38 -year-old female with feeding tube placement. Initial encounter.  EXAM: ABDOMEN - 1 VIEW  COMPARISON:  Portable chest radiograph from 0816 hr today.  FINDINGS: Portable AP supine view at 1937 hrs. Feeding tube tip projects over the body of the stomach. This is about 12 cm proximal to the level of the pylorus. Negative bowel gas pattern. Aortoiliac calcified atherosclerosis noted. Left femoral approach central venous catheter, tip projects at the left lower sacrum. No acute osseous  abnormality identified.  IMPRESSION: Feeding tube tip at the body of the stomach, advanced 12 cm to allow adequate slack for post pyloric transit.   Electronically Signed   By: Augusto Gamble M.D.   On: 06/30/2014 20:10   Ct Head Wo Contrast  06/30/2014   CLINICAL DATA:  68 year old with acute encephalopathy and altered mental status.  EXAM: CT HEAD WITHOUT CONTRAST  TECHNIQUE: Contiguous axial images were obtained from the base of the skull through the vertex without contrast.  COMPARISON:  06/27/2014  FINDINGS: No evidence for acute hemorrhage, mass lesion, midline shift, hydrocephalus or large infarct. Subtle white matter changes in the right frontal lobe are again noted. Small amount of fluid in the left mastoid air cells. Patient has developed fluid in the right sphenoid sinus. No acute bone abnormality.  IMPRESSION: No acute intracranial abnormality.  New fluid in the right sphenoid sinus. Again noted is a small amount of fluid in the left mastoid air cells.   Electronically Signed   By: Richarda Overlie M.D.   On: 06/30/2014 13:39   Dg Chest Port 1 View  06/30/2014   CLINICAL DATA:  Follow-up of pulmonary edema; history of dialysis dependent end-stage renal disease, pulmonary hypertension, coronary artery disease, and diabetes  EXAM: PORTABLE CHEST - 1 VIEW  COMPARISON:  Portable chest x-ray of June 29, 2014  FINDINGS: The patient has undergone interval extubation of the trachea and esophagus. The lungs remain hypoinflated. There is an infiltrate in the left lower lobe posteriorly with persistent partial obscuration of the left hemidiaphragm. The cardiac silhouette is enlarged. The central pulmonary vascularity is engorged and the pulmonary interstitial markings are mildly increased. The dialysis catheter tip overlies the distal portion of the SVC.  IMPRESSION: Findings of worsening pulmonary interstitial edema. There is also left lower lobe pneumonia and a small left pleural effusion. The appearance of the  chest has deteriorated since yesterday's study.   Electronically Signed   By: David  [place]   On: 06/30/2014 08:41   Dg Abd Portable 1v  06/30/2014   CLINICAL DATA:  Feeding tube placement.  EXAM: PORTABLE ABDOMEN - 1 VIEW  COMPARISON:  Chest and abdomen radiographs obtained earlier today.  FINDINGS: Feeding tube tip in the distal stomach in the region of the gastric pylorus. Paucity of intestinal gas with no dilated bowel loops seen. Probable enlarged liver. Left jugular catheter tip in the inferior right atrium. Enlarged cardiac silhouette. No significant change in bibasilar airspace opacity  IMPRESSION: 1. Feeding tube tip in the distal stomach. 2. Probable hepatomegaly. 3. Cardiomegaly and bibasilar pneumonia or patchy atelectasis.   Electronically Signed   By: Gordan Payment M.D.   On: 06/30/2014 21:37     ASSESSMENT / PLAN:  PULMONARY OETT 10/19 >> 10/21 A:  Acute hypoxic respiratory failure s/p cardiac arrest  > resolved HCAP P:   Aspiration precautions See ID CXR prn  CARDIOVASCULAR  CVL Left IJ tunneled HD cath 10/19 Left femoral triple lumen 10/19 > 10/23 A:  Cardiopulmonary arrest, PEA most likely due to chronic baseline hypotension and pulmonary hypertension Shock> multifactorial, has chronic hypotension, septic? > resolved Afib, chronic H/o diastolic HF , pulmonary hypertension -RVSP 34 on echo Severe known CAD P:  No anticoagulation considering hematoma left arm and history of GI bleed Monitor BP  RENAL A:   ESRD> renal following Hyperkalemia resolved  P:   HD per renal  GASTROINTESTINAL A:   No acute  P:   Panda, Nepro per dietary recs PPI for SUP   HEMATOLOGIC A:  Recent hematoma L AV fistula>  Stable Anemia of chronic kidney disease, no bleeding P:  [place] heparin for dvt prophylaxis Serial CBC   INFECTIOUS A:  HCAP with fever 10/22 10/22 blood culture >> 10/22 vanc >> 10/22 ceftax >> P:   Plan 7 days HCAP therapy Pull femoral CVL, place  peripheral IV  ENDOCRINE A:   DM  P:   SSI/CBG Stop D10  NEUROLOGIC A:   Acute encephalopathy s/p arrest - discussed EEG findings with neurology, this can represent delirium or anoxic brain injury which both are possible based on clinical exam H/o CVAs  P:   Continue supportive care No sedating medications   Family Updates: daughter and brother updated at bedside at length again 10/23, we discussed code status; she still desires full code.   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  Continue supportive care as family desires full code status.  If she does not wake up by next week after fever/HCAP resolved may need to invoke ethics committee if family still desires full code.  Transfer to SDU, TRH service  Heber CarolinaBrent Zigmund Linse, MD [facility] Pager: [phone] Cell: [phone] If no response, call [phone]    07/01/2014, 10:13 AM

## 2014-07-02 ENCOUNTER — Inpatient Hospital Stay (HOSPITAL_COMMUNITY): Payer: Medicare Other

## 2014-07-02 DIAGNOSIS — I2510 Atherosclerotic heart disease of native coronary artery without angina pectoris: Secondary | ICD-10-CM

## 2014-07-02 LAB — GLUCOSE, CAPILLARY
GLUCOSE-CAPILLARY: 213 mg/dL — AB (ref 70–99)
GLUCOSE-CAPILLARY: 245 mg/dL — AB (ref 70–99)
GLUCOSE-CAPILLARY: 235 mg/dL — AB (ref 70–99)
Glucose-Capillary: 199 mg/dL — ABNORMAL HIGH (ref 70–99)
Glucose-Capillary: 169 mg/dL — ABNORMAL HIGH (ref 70–99)
Glucose-Capillary: 196 mg/dL — ABNORMAL HIGH (ref 70–99)

## 2014-07-02 LAB — CULTURE, BLOOD (ROUTINE X 2)

## 2014-07-02 LAB — BASIC METABOLIC PANEL
Anion gap: 22 — ABNORMAL HIGH (ref 5–15)
BUN: 74 mg/dL — AB (ref 6–23)
CALCIUM: 9.4 mg/dL (ref 8.4–10.5)
CO2: 20 mEq/L (ref 19–32)
CREATININE: 7.82 mg/dL — AB (ref 0.50–1.10)
Chloride: 99 mEq/L (ref 96–112)
GFR calc Af Amer: 5 mL/min — ABNORMAL LOW (ref >90)
GFR, EST NON AFRICAN AMERICAN: 5 mL/min — AB (ref >90)
Glucose, Bld: 228 mg/dL — ABNORMAL HIGH (ref 70–99)
Potassium: 5.3 mEq/L (ref 3.7–5.3)
Sodium: 141 mEq/L (ref 137–147)

## 2014-07-02 MED ORDER — DOXERCALCIFEROL 4 MCG/2ML IV SOLN
5.0000 ug | INTRAVENOUS | Status: DC
  Administered 2014-07-04 – 2014-07-06 (×2): 5 ug via INTRAVENOUS
  Filled 2014-07-02 (×2): qty 4

## 2014-07-02 MED ORDER — DOXERCALCIFEROL 4 MCG/2ML IV SOLN
5.0000 ug | INTRAVENOUS | Status: AC
  Filled 2014-07-02: qty 4

## 2014-07-02 MED ORDER — VANCOMYCIN HCL IN DEXTROSE 750-5 MG/150ML-% IV SOLN
750.0000 mg | Freq: Once | INTRAVENOUS | Status: AC
  Administered 2014-07-02: 750 mg via INTRAVENOUS
  Filled 2014-07-02: qty 150

## 2014-07-02 MED ORDER — SODIUM CHLORIDE 0.9 % IV SOLN
62.5000 mg | Freq: Once | INTRAVENOUS | Status: AC
  Administered 2014-07-02: 62.5 mg via INTRAVENOUS
  Filled 2014-07-02: qty 5

## 2014-07-02 MED ORDER — DILTIAZEM HCL ER COATED BEADS 120 MG PO CP24
120.0000 mg | ORAL_CAPSULE | Freq: Every day | ORAL | Status: DC
  Filled 2014-07-02: qty 1

## 2014-07-02 MED ORDER — SODIUM CHLORIDE 0.9 % IV SOLN
62.5000 mg | INTRAVENOUS | Status: DC
  Administered 2014-07-06: 62.5 mg via INTRAVENOUS
  Filled 2014-07-02 (×2): qty 5

## 2014-07-02 MED ORDER — DARBEPOETIN ALFA-POLYSORBATE 25 MCG/0.42ML IJ SOLN
25.0000 ug | Freq: Once | INTRAMUSCULAR | Status: AC
  Administered 2014-07-04: 25 ug via INTRAVENOUS
  Filled 2014-07-02: qty 0.42

## 2014-07-02 MED ORDER — METOPROLOL TARTRATE 25 MG PO TABS
25.0000 mg | ORAL_TABLET | Freq: Four times a day (QID) | ORAL | Status: DC
  Administered 2014-07-02 – 2014-07-03 (×2): 25 mg via ORAL
  Filled 2014-07-02 (×5): qty 1

## 2014-07-02 MED ORDER — DEXTROSE 5 % IV SOLN
2.0000 g | Freq: Once | INTRAVENOUS | Status: AC
  Administered 2014-07-02: 2 g via INTRAVENOUS
  Filled 2014-07-02: qty 2

## 2014-07-02 NOTE — Consult Note (Signed)
Primary cardiologist: Consulting cardiologist:  Clinical Summary Briana Gould is a 68 y.o.female history of PAD, DM2, ESRD, CVA, pulm HTN, CAD, admitted with cardiac arrest.   She has a complex medical history.  She has known diffuse CAD not amenable to revascularization.  Last cath 11/2013 LM 20% distal, LAD mid 50-60%, distal 70-80%, LCX 90% prox involving ostium, 80-90% mid, distal 50%. OM1 occluded. OM2 ostial 90%. OM3 90% prox. RCA 100% occluded with left to right collaterals. RHC RA 20, mean PA 47, PCWP 19 From notes given her comrobidities thought poor candidate for CABG. Only PCI amenable lesion is LCX, however given problems with GI bleeds in the past concern for DAPT, and stenting of LCX would still leave significant ischemic burden. - Echo 06/28/14 LVEF 45-50%,  - medical therapy has been limited by hypotension.   From notes she has had multiple prior cardiac arrests, previously related to HD, previously agonal breathing with PEA arrest.   Admitted after cardiac arrest 10/19 after placement of tunneled HD catheter at an outpatient surgical center. She had not had HD at the time since 06/22/14 due to access issues. Patient was noted to be apneic with loss of pulse, received CPR x 2 minutes before ROSC. Intubated during arrest. From notes thought to be primary respiratory arrest related to procedural sedation. The exact arrest rhythm is unclear from what is documented.        Allergies  Allergen Reactions  . Phenergan [Promethazine Hcl] Other (See Comments)    Causes patient to" vomit more"  . Vitamin D Analogs Rash    Medications Scheduled Medications: . antiseptic oral rinse  7 mL Mouth Rinse q12n4p  . cefTAZidime (FORTAZ)  IV  2 g Intravenous Once  . chlorhexidine  15 mL Mouth Rinse BID  . darbepoetin (ARANESP) injection - DIALYSIS  25 mcg Intravenous Once  . [START ON 07/04/2014] doxercalciferol  5 mcg Intravenous Q M,W,F-HD  . doxercalciferol  5 mcg Intravenous Q  Sat-HD  . feeding supplement (PRO-STAT SUGAR FREE 64)  30 mL Oral q morning - 10a  . [START ON 07/06/2014] ferric gluconate (FERRLECIT/NULECIT) IV  62.5 mg Intravenous Q Wed-HD  . heparin  5,000 Units Subcutaneous 3 times per day  . insulin aspart  0-9 Units Subcutaneous 6 times per day  . pneumococcal 23 valent vaccine  0.5 mL Intramuscular Tomorrow-1000  . prednisoLONE acetate  1 drop Both Eyes TID  . vancomycin  750 mg Intravenous Once     Infusions: . diltiazem (CARDIZEM) infusion 5 mg/hr (07/02/14 1137)  . feeding supplement (NEPRO CARB STEADY) 1,000 mL (07/02/14 0800)     PRN Medications:  labetalol   Past Medical History  Diagnosis Date  . PAD (peripheral artery disease)     a. L BKA  . Type II diabetes mellitus   . Anemia   . History of blood transfusion 2002    "related to amputation" (08/13/2013)  . End stage renal disease on dialysis     [facility]" (08/13/2013)  . DVT (deep venous thrombosis)     a. R IJ DVT 08/2013.  Marland Kitchen. CVA (cerebral infarction)     a. MRI 08/2013: possible left motor strip infarct with remote pontine infarct.  . Pulmonary HTN     a. Cath 2011 - mod to severe pulmonary hypertension. b. Echo 08/2013: PAP 45mmHg (mild-mod increased).  . Thrombocytopenia   . CAD (coronary artery disease)     a. Severe diffuse diabetic disease  by cath 2011, for med rx, not a candidate for CABG or PCI at that time.  . Aortic stenosis     mild  . Syncope     a. 2011, etiology never clear.  . Carotid artery disease     a. Dopp 16/1096: 1-39% LICA< 60-79% RICA borderline >80%.  . Cerebrovascular disease     a. 08/2013 MRA brain: multiple bilateral moderate and high grade intracranial stenoses  . Cardiac arrest 08/14/13    a. Possible arrest in setting of hypotension during dialysis with possible arrest requiring brief CPR, not intubated and was fully intact after episode. Suspected due to low cerebral perfusion in setting of hypotension.  Unclear if any arrhythmia.  . End stage renal disease on dialysis   . Peripheral arterial occlusive disease   . Hypertension   . Diabetes mellitus type 2, uncontrolled, with complications   . DVT (deep venous thrombosis)      Right internal jugular vein  . CAD (coronary artery disease), native coronary artery 2011    Diffuse distal vessel CAD noted at cardiac catheterization  . MI (myocardial infarction)   . Stroke     Past Surgical History  Procedure Laterality Date  . Av fistula placement Left 2011    upper arm  . Below knee leg amputation Left 2002  . Tonsillectomy    . Cataract extraction w/ intraocular lens  implant, bilateral Bilateral 2012-2014  . Esophagogastroduodenoscopy N/A 08/30/2013    Procedure: ESOPHAGOGASTRODUODENOSCOPY (EGD);  Surgeon: Theda Belfast, MD;  Location: [facility] ENDOSCOPY;  Service: Endoscopy;  Laterality: N/A;  . Below knee leg amputation Left   . Av fistula placement Left     Family History  Problem Relation Age of Onset  . Hypertension Mother   . Hypertension Father   . Heart attack Brother   . Stroke Mother   . Pulmonary embolism Father     Social History Briana Gould reports that she has never smoked. She does not have any smokeless tobacco history on file. Briana Gould reports that she does not drink alcohol.  Review of Systems Patient is somnolent, unable to collect ROS     Physical Examination Blood pressure 111/48, pulse 80, temperature 99 F (37.2 C), temperature source Oral, resp. rate 18, height 5' (1.524 m), weight 164 lb 3.9 oz (74.5 kg), SpO2 97.00%.  Intake/Output Summary (Last 24 hours) at 07/02/14 1352 Last data filed at 07/02/14 1200  Gross per 24 hour  Intake 1044.37 ml  Output      0 ml  Net 1044.37 ml    HEENT: Sclera clear  Cardiovascular: RRR, 2/6 systolic murmur RUSB, no JVD  Respiratory: CTAB  GI: abdomen soft, NT, ND  MSK: no LE edema  Neuro: somnolent    Lab Results  Basic Metabolic  Panel:  Recent Labs Lab 06/28/14 2339 06/29/14 0411 06/30/14 1045 07/01/14 0445 07/02/14 0220  NA 137 139 136* 139 141  K 4.4 5.0 4.6 5.0 5.3  CL 95* 96 95* 96 99  CO2 22 20 25 21 20   GLUCOSE 146* 147* 131* 152* 228*  BUN 24* 26* 27* 42* 74*  CREATININE 4.03* 4.46* 4.94* 6.39* 7.82*  CALCIUM 9.6 9.6 9.5 9.7 9.4    Liver Function Tests:  Recent Labs Lab 06/27/14 1125  AST 49*  ALT 44*  ALKPHOS 247*  BILITOT 0.5  PROT 8.2  ALBUMIN 3.8    CBC:  Recent Labs Lab 06/27/14 1125  06/27/14 1822 06/28/14 1000 06/29/14 0450 06/30/14 1045  07/01/14 0445  WBC 7.5  --   --  5.5 12.8* 11.2* 9.5  NEUTROABS 5.0  --   --   --  11.4*  --  7.5  HGB 12.0  < > 13.6 10.8* 12.9 11.2* 10.1*  HCT 36.1  < > 40.0 31.6* 37.6 33.7* 31.1*  MCV 93.8  --   --  92.7 94.2 96.8 94.8  PLT 174  --   --  146* 209 182 165  < > = values in this interval not displayed.  Cardiac Enzymes:  Recent Labs Lab 06/27/14 1125  TROPONINI <0.30    BNP: No components found with this basename: POCBNP,     Impression/Recommendations  1. CAD - only see one trop on presnetation which was negative, EKG with old LBBB not interpretable for acute ischemia. Echo this admit overall stable function - from review of several notes, including most recent Dr Earmon Phoenixooper's clinic note 12/07/13 the patient based on her comorbidities and coronary anatomy does not have revascularization options as far as her severe CAD - unclear if CAD related to this event, from H&P initial concern was possible respiratory initiated arrest. No documented ventricular arrhythmias, echo overall stable, history suggests possible respiratory event related to sedation - continue current medical therapy for CAD, she is not a candidate for revasc.     2. HCAP - abx per primary team  3. Afib - started on cardizem gtt, rates controlled, has been transitioned to oral - from prior notes she has had consistent problems with anemia and one was of  the concerns about possible PCI and committing her to DAPT, mention of prior GI bleeds and "major bleeding on antiplatelet and anticoagulant drugs.. Similar concern would exist for committing to anticoag at this point. Would contiue rate control, careful dosing as she has had troubles with hypotension in the past during HD with bp meds.  - given her CAD would suggests beta blocker as opposed to CCB, would change dilt to oral lopressor (short acting to allow titration given her previous labile hemodynamics on HD)  4. ESRD - per renal    Dina RichJonathan Branch, M.D.

## 2014-07-02 NOTE — Progress Notes (Signed)
[facility] Progress Note   Subjective: Moaning and crying today, no verbal response  Filed Vitals:   07/02/14 0430 07/02/14 0500 07/02/14 0600 07/02/14 0700  BP:  113/48 134/59 134/57  Pulse:  75 78 79  Temp:    97.8 F (36.6 C)  TempSrc:    Oral  Resp:      Height:      Weight: 74.5 kg (164 lb 3.9 oz)     SpO2:  97% 97% 99%   Exam: Moaning, grimacing, crying - will not respond verbally or follow commands No jvd  Chest mostly clear, occ scattered rales RRR 2/6 SEM no gallop  Abd obese, soft, NTND, no mass  L BKA, no LE edema L IJ TDC intact; LUA AVF +bruit , ecchymosis improving Neuro as above  HD: MWF  F180 4h 75kg 400/800 2/2.0 [place] Heparin 5000  Aranesp 25 every 2 weeks (last 10/07), Venofer 50 mg / wed, Hectorol 5 ug TIW   Assessment:  1 Cardioresp arrest - s/p cooling protocol 2 Vol excess / pulm edema - resolved, below dry wt 3 AMS - hypoxic injury and/or delirium 4 Afib - long hx, rapid VR last night, back in SR today 5 ESRD on HD 6 Access - resting infiltrated AVF, has new TDC L IJ 7 Anemia Hb down to 10.0, give aranesp 25ug today with HD then q 2 wks 8 PNA suspected, on emp AB 9 HTN - keep SBP over 115-120 if possible due to hx of severe ASCVD + longstanding HTN 10 DM per prim  11 HPTH cont vit D, resume Renvela 2 ac tid when eating  12 CM EF 35% 13 Severe CAD, not surg candidate / severe ASCVD, not candidate for procedure d/t GIB on antilplatelets   Plan- HD today then on Monday     Vinson Moselleob Emerie Vanderkolk MD  pager [phone]    cell [phone]  07/02/2014, 9:17 AM     Recent Labs Lab 06/30/14 1045 07/01/14 0445 07/02/14 0220  NA 136* 139 141  K 4.6 5.0 5.3  CL 95* 96 99  CO2 25 21 20   GLUCOSE 131* 152* 228*  BUN 27* 42* 74*  CREATININE 4.94* 6.39* 7.82*  CALCIUM 9.5 9.7 9.4    Recent Labs Lab 06/27/14 1125  AST 49*  ALT 44*  ALKPHOS 247*  BILITOT 0.5  PROT 8.2  ALBUMIN 3.8    Recent Labs Lab 06/27/14 1125   06/29/14 0450 06/30/14 1045 07/01/14 0445  WBC 7.5  < > 12.8* 11.2* 9.5  NEUTROABS 5.0  --  11.4*  --  7.5  HGB 12.0  < > 12.9 11.2* 10.1*  HCT 36.1  < > 37.6 33.7* 31.1*  MCV 93.8  < > 94.2 96.8 94.8  PLT 174  < > 209 182 165  < > = values in this interval not displayed. Marland Kitchen. antiseptic oral rinse  7 mL Mouth Rinse q12n4p  . chlorhexidine  15 mL Mouth Rinse BID  . darbepoetin (ARANESP) injection - DIALYSIS  25 mcg Intravenous Once  . [START ON 07/04/2014] doxercalciferol  5 mcg Intravenous Q M,W,F-HD  . doxercalciferol  5 mcg Intravenous Q Sat-HD  . feeding supplement (PRO-STAT SUGAR FREE 64)  30 mL Oral q morning - 10a  . ferric gluconate (FERRLECIT/NULECIT) IV  62.5 mg Intravenous Once  . [START ON 07/06/2014] ferric gluconate (FERRLECIT/NULECIT) IV  62.5 mg Intravenous Q Wed-HD  . heparin  5,000 Units Subcutaneous 3 times per day  . insulin aspart  0-9 Units Subcutaneous 6 times per day  . pneumococcal 23 valent vaccine  0.5 mL Intramuscular Tomorrow-1000  . prednisoLONE acetate  1 drop Both Eyes TID   . diltiazem (CARDIZEM) infusion 5 mg/hr (07/02/14 0330)  . feeding supplement (NEPRO CARB STEADY) 1,000 mL (07/01/14 2000)   labetalol

## 2014-07-02 NOTE — Progress Notes (Signed)
Patient transferred to Hemodialysis for 3 hour run. Once patient in dialysis RN noted b/p to be 90's. RN stated MD notified of B/P and he canceled dialysis tonight. "We have had this patient many times and she will cardiac arrest with low B/P's." [facility] and brought [facility] back to room. B/P's sustaining 100's or 50's. Will monitor closely.

## 2014-07-02 NOTE — Progress Notes (Signed)
ANTIBIOTIC CONSULT NOTE - INITIAL  Pharmacy Consult for Ceftazidime + Vancomycin Indication: pneumonia  Allergies  Allergen Reactions  . Phenergan [Promethazine Hcl] Other (See Comments)    Causes patient to" vomit more"  . Vitamin D Analogs Rash    Patient Measurements: Height: 5' (152.4 cm) Weight: 164 lb 3.9 oz (74.5 kg) IBW/kg (Calculated) : 45.5  Vital Signs: Temp: 99 F (37.2 C) (10/24 1100) Temp Source: Oral (10/24 1100) BP: 102/42 mmHg (10/24 1100) Pulse Rate: 83 (10/24 1100) Intake/Output from previous day: 10/23 0701 - 10/24 0700 In: 669.4 [I.V.:59.4; NG/GT:610] Out: -  Intake/Output from this shift: Total I/O In: 495 [I.V.:15; NG/GT:480] Out: -   Labs:  Recent Labs  06/30/14 1045 07/01/14 0445 07/02/14 0220  WBC 11.2* 9.5  --   HGB 11.2* 10.1*  --   PLT 182 165  --   CREATININE 4.94* 6.39* 7.82*   Estimated Creatinine Clearance: 6.2 ml/min (by C-G formula based on Cr of 7.82). No results found for this basename: VANCOTROUGH, Leodis BinetVANCOPEAK, VANCORANDOM, GENTTROUGH, GENTPEAK, GENTRANDOM, TOBRATROUGH, TOBRAPEAK, TOBRARND, AMIKACINPEAK, AMIKACINTROU, AMIKACIN,  in the last 72 hours   Microbiology: Recent Results (from the past 720 hour(s))  MRSA PCR SCREENING     Status: None   Collection Time    06/27/14  2:18 PM      Result Value Ref Range Status   MRSA by PCR NEGATIVE  NEGATIVE Final   Comment:            The GeneXpert MRSA Assay (FDA     approved for NASAL specimens     only), is one component of a     comprehensive MRSA colonization     surveillance program. It is not     intended to diagnose MRSA     infection nor to guide or     monitor treatment for     MRSA infections.  CULTURE, BLOOD (ROUTINE X 2)     Status: None   Collection Time    06/30/14 12:20 PM      Result Value Ref Range Status   Specimen Description BLOOD RIGHT HAND   Final   Special Requests BOTTLES DRAWN AEROBIC AND ANAEROBIC 5CC   Final   Culture  Setup Time     Final   Value: 06/30/2014 17:34     Performed at [facility]   Culture     Final   Value: STAPHYLOCOCCUS SPECIES (COAGULASE NEGATIVE)     Note: THE SIGNIFICANCE OF ISOLATING THIS ORGANISM FROM A SINGLE SET OF BLOOD CULTURES WHEN MULTIPLE SETS ARE DRAWN IS UNCERTAIN. PLEASE NOTIFY THE MICROBIOLOGY DEPARTMENT WITHIN ONE WEEK IF SPECIATION AND SENSITIVITIES ARE REQUIRED.     Note: Gram Stain Report Called to,Read Back By and Verified With: TONYA@12 :16PM ON 07/01/14 BY DANTS     Performed at [facility]   Report Status 07/02/2014 FINAL   Final  CULTURE, BLOOD (ROUTINE X 2)     Status: None   Collection Time    06/30/14 12:26 PM      Result Value Ref Range Status   Specimen Description BLOOD RIGHT THUMB   Final   Special Requests BOTTLES DRAWN AEROBIC ONLY 5CC   Final   Culture  Setup Time     Final   Value: 06/30/2014 17:35     Performed at [facility]   Culture     Final   Value:        BLOOD CULTURE RECEIVED NO GROWTH TO  DATE CULTURE WILL BE HELD FOR 5 DAYS BEFORE ISSUING A FINAL NEGATIVE REPORT     Performed at [facility]   Report Status PENDING   Incomplete    Medical History: Past Medical History  Diagnosis Date  . PAD (peripheral artery disease)     a. L BKA  . Type II diabetes mellitus   . Anemia   . History of blood transfusion 2002    "related to amputation" (08/13/2013)  . End stage renal disease on dialysis     [facility]" (08/13/2013)  . DVT (deep venous thrombosis)     a. R IJ DVT 08/2013.  Marland Kitchen. CVA (cerebral infarction)     a. MRI 08/2013: possible left motor strip infarct with remote pontine infarct.  . Pulmonary HTN     a. Cath 2011 - mod to severe pulmonary hypertension. b. Echo 08/2013: PAP 45mmHg (mild-mod increased).  . Thrombocytopenia   . CAD (coronary artery disease)     a. Severe diffuse diabetic disease by cath 2011, for med rx, not a candidate for CABG or PCI at that time.  . Aortic stenosis     mild  .  Syncope     a. 2011, etiology never clear.  . Carotid artery disease     a. Dopp [id number]: 1-39% LICA< 60-79% RICA borderline >80%.  . Cerebrovascular disease     a. 08/2013 MRA brain: multiple bilateral moderate and high grade intracranial stenoses  . Cardiac arrest 08/14/13    a. Possible arrest in setting of hypotension during dialysis with possible arrest requiring brief CPR, not intubated and was fully intact after episode. Suspected due to low cerebral perfusion in setting of hypotension. Unclear if any arrhythmia.  . End stage renal disease on dialysis   . Peripheral arterial occlusive disease   . Hypertension   . Diabetes mellitus type 2, uncontrolled, with complications   . DVT (deep venous thrombosis)      Right internal jugular vein  . CAD (coronary artery disease), native coronary artery 2011    Diffuse distal vessel CAD noted at cardiac catheterization  . MI (myocardial infarction)   . Stroke     Medications:  Scheduled:  . antiseptic oral rinse  7 mL Mouth Rinse q12n4p  . cefTAZidime (FORTAZ)  IV  2 g Intravenous Once  . chlorhexidine  15 mL Mouth Rinse BID  . darbepoetin (ARANESP) injection - DIALYSIS  25 mcg Intravenous Once  . [START ON 07/04/2014] doxercalciferol  5 mcg Intravenous Q M,W,F-HD  . doxercalciferol  5 mcg Intravenous Q Sat-HD  . feeding supplement (PRO-STAT SUGAR FREE 64)  30 mL Oral q morning - 10a  . ferric gluconate (FERRLECIT/NULECIT) IV  62.5 mg Intravenous Once  . [START ON 07/06/2014] ferric gluconate (FERRLECIT/NULECIT) IV  62.5 mg Intravenous Q Wed-HD  . heparin  5,000 Units Subcutaneous 3 times per day  . insulin aspart  0-9 Units Subcutaneous 6 times per day  . pneumococcal 23 valent vaccine  0.5 mL Intramuscular Tomorrow-1000  . prednisoLONE acetate  1 drop Both Eyes TID  . vancomycin  750 mg Intravenous Once   Infusions:  . diltiazem (CARDIZEM) infusion 5 mg/hr (07/02/14 0800)  . feeding supplement (NEPRO CARB STEADY) 1,000 mL (07/02/14  0800)   Assessment: 68 yo F admitted 06/27/2014 s/p arrest at out patient surgical center after placement of tunneled HD cath. Patient is ESRD on HD now to start IV abx empirically for HCAP day #3. Tmax  100.2, WBC trend down to 9.5, extubated on 10/21. Scheduled for HD today so orders will be entered to be given after HD.  Ceftaz 10/22>> Vanc 10/22>>  10/22 BCx2>>1/2 CoNS  Goal of Therapy:  Pre-HD level: 15-25 mcg/mL Post-HD level: 5-15 mcg/mL   Plan:  - Ceftazidime 2 gm IV qHD - Vanc 750 mg IV qHD - Subsequent doses will be ordered based on HD tolerance and schedule - Monitor temp, WBC, C&S, vanc levels as indicated

## 2014-07-02 NOTE — Progress Notes (Signed)
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 She was getting hemodialysis catheter placed, became pulseless, received CPR for 2 minutes then return of spontaneous circulation. This represented her third cardiac arrest. She was placed under the hypothermia protocol. On 06/29/2014 she was rewarmed and extubated. Patient remains lethargic and nonverbal and there is concern that there may be an anoxic encephalopathy. Pulmonary critical care medicine discussed goals of care with family as they desire for full CODE STATUS.  Patient has also been evaluated by nephrology during this hospitalization for hemodialysis. On 07/02/2014 case discussed with cardiology requesting consultation.  Assessment/Plan: 1. Cardiac arrest -Patient with history of severe multivessel coronary artery disease, not found to be candidate for percutaneous intervention, having a history of cardiac arrest x 2 -Cardiology consulted  -Overall her finger prognosis is poor, as treatment options may be limited for her diffuse severe disease  2.  Healthcare associated pneumonia -Chest x-ray performed on 06/30/2014 showing bibasilar pneumonia versus patchy atelectasis -Continue Ceftaz  2 g IV and Vancomycin -Blood cultures obtained on 06/28/2014 showing no growth x2 sets -Supportive care  3.  Atrial fibrillation rapid ventricular response -Patient going into A. fib with RVR  having ventricular rates in the 180s occurring overnight for which she was started on a Cardizem drip -Cardizem running  at 5 mg, with heart rates down to 80s -Will discontinue IV Cardizem, transition to oral Cardizem 120 mg by mouth daily\ -Cardiology has been consulted  4.  End-stage renal disease -Nephrology consulted, patient receiving hemodialysis -Has new tunnel dialysis catheter  5.  Severe multivessel coronary artery disease -Patient's not candidate for percutaneous intervention or coronary artery bypass grafting -Has history of anemia  6.  Encephalopathy -Patient remains nonverbal, unable to follow commands. Could be secondary to hypoxic brain injury, versus delirium -Nursing staff reporting there is some improvement today  7.  Type 2 diabetes mellitus -Continue Accu-Cheks every 4 hours with sliding scale coverage  Code Status: Full Code Family Communication: I spoke with her daughter Disposition Plan: Will transfer to step down unit   Consultants:  Cardiology  PCCM  Nephrology  Procedures: 10/19>10/20 hypothermia protocol  10/20 HD  10/21 rewarmed, making some purposeful movements, extubated   Antibiotics:  Ceftaz  Vancomycin  HPI/Subjective: Patient remains encephalopathic, nonverbal, cannot follow commands for me.  Objective: Filed Vitals:   07/02/14 1200  BP: 111/48  Pulse: 80  Temp:   Resp:     Intake/Output Summary (Last 24 hours) at 07/02/14 1340 Last data filed at 07/02/14 1200  Gross per 24 hour  Intake 1044.37 ml  Output      0 ml  Net 1044.37 ml   Filed Weights   06/29/14 1115 07/01/14 0452 07/02/14 0430  Weight: 78.3 kg (172 lb 9.9 oz) 73.3 kg (161 lb 9.6 oz) 74.5 kg (164 lb 3.9 oz)    Exam:   General:  Patient is nonverbal, not following commands, lethargic  Cardiovascular: Irregular rate and rhythm normal S1-S2  Respiratory: Positive crackles and rhonchi bilaterally, having normal respiratory effort no wheezing or rales  Abdomen: Obese, soft nontender nondistended  Musculoskeletal: Status post left below the knee amputation  Data  Reviewed: Basic Metabolic Panel:  Recent Labs Lab 06/28/14 2339 06/29/14 0411 06/30/14 1045 07/01/14 0445 07/02/14 0220  NA 137 139 136* 139 141  K 4.4 5.0 4.6 5.0 5.3  CL 95* 96 95* 96 99  CO2 22 20 25 21 20   GLUCOSE 146* 147* 131* 152* 228*  BUN 24* 26* 27* 42* 74*  CREATININE 4.03* 4.46* 4.94* 6.39* 7.82*  CALCIUM 9.6 9.6 9.5 9.7 9.4   Liver Function Tests:  Recent Labs Lab 06/27/14 1125  AST 49*  ALT 44*  ALKPHOS 247*  BILITOT 0.5  PROT 8.2  ALBUMIN 3.8   No results found for this basename: LIPASE, AMYLASE,  in the last 168 hours No results found for this basename: AMMONIA,  in the last 168 hours CBC:  Recent Labs Lab 06/27/14 1125  06/27/14 1822 06/28/14 1000 06/29/14 0450 06/30/14 1045 07/01/14 0445  WBC 7.5  --   --  5.5 12.8* 11.2* 9.5  NEUTROABS 5.0  --   --   --  11.4*  --  7.5  HGB 12.0  < > 13.6 10.8* 12.9 11.2* 10.1*  HCT 36.1  < > 40.0 31.6* 37.6 33.7* 31.1*  MCV 93.8  --   --  92.7 94.2 96.8 94.8  PLT 174  --   --  146* 209 182 165  < > = values in this interval not displayed. Cardiac Enzymes:  Recent Labs Lab 06/27/14 1125  TROPONINI <0.30   BNP (last 3 results)  Recent Labs  09/20/13 0913 09/20/13 1155  PROBNP 57457.0* 47172.0*   CBG:  Recent Labs Lab 07/01/14 2011 07/01/14 2324 07/02/14 0425 07/02/14 0732 07/02/14 1124  GLUCAP 194* 220* 199* 213* 169*    Recent Results (from the past 240 hour(s))  MRSA PCR SCREENING     Status: None   Collection Time    06/27/14  2:18 PM      Result Value Ref Range Status   MRSA by PCR NEGATIVE  NEGATIVE Final   Comment:            The GeneXpert MRSA Assay (FDA     approved for NASAL specimens     only), is one component of a     comprehensive MRSA colonization     surveillance program. It is not     intended to diagnose MRSA     infection nor to guide or     monitor treatment for     MRSA infections.  CULTURE, BLOOD (ROUTINE X 2)     Status: None   Collection Time     06/30/14 12:20 PM      Result Value Ref Range Status   Specimen Description BLOOD RIGHT HAND   Final   Special Requests BOTTLES DRAWN AEROBIC AND ANAEROBIC 5CC   Final   Culture  Setup Time     Final   Value: 06/30/2014 17:34     Performed at [facility]   Culture     Final   Value: STAPHYLOCOCCUS SPECIES (COAGULASE NEGATIVE)     Note: THE SIGNIFICANCE OF ISOLATING THIS ORGANISM FROM A SINGLE SET OF BLOOD CULTURES WHEN MULTIPLE SETS ARE DRAWN IS UNCERTAIN. PLEASE NOTIFY THE MICROBIOLOGY DEPARTMENT WITHIN ONE WEEK IF SPECIATION AND SENSITIVITIES ARE REQUIRED.     Note: Gram Stain Report Called to,Read Back By and Verified With: TONYA@12 :16PM ON 07/01/14 BY DANTS     Performed at [facility]   Report Status 07/02/2014 FINAL   Final  CULTURE, BLOOD (  ROUTINE X 2)     Status: None   Collection Time    06/30/14 12:26 PM      Result Value Ref Range Status   Specimen Description BLOOD RIGHT THUMB   Final   Special Requests BOTTLES DRAWN AEROBIC ONLY 5CC   Final   Culture  Setup Time     Final   Value: 06/30/2014 17:35     Performed at [facility]   Culture     Final   Value:        BLOOD CULTURE RECEIVED NO GROWTH TO DATE CULTURE WILL BE HELD FOR 5 DAYS BEFORE ISSUING A FINAL NEGATIVE REPORT     Performed at [facility]   Report Status PENDING   Incomplete     Studies: Dg Abd 1 View  06/30/2014   CLINICAL DATA:  15Sixty-eight -year-old female with feeding tube placement. Initial encounter.  EXAM: ABDOMEN - 1 VIEW  COMPARISON:  Portable chest radiograph from 0816 hr today.  FINDINGS: Portable AP supine view at 1937 hrs. Feeding tube tip projects over the body of the stomach. This is about 12 cm proximal to the level of the pylorus. Negative bowel gas pattern. Aortoiliac calcified atherosclerosis noted. Left femoral approach central venous catheter, tip projects at the left lower sacrum. No acute osseous abnormality identified.  IMPRESSION: Feeding tube tip at  the body of the stomach, advanced 12 cm to allow adequate slack for post pyloric transit.   Electronically Signed   By: Augusto GambleLee  Hall M.D.   On: 06/30/2014 20:10   Dg Chest Port 1 View  07/01/2014   CLINICAL DATA:  Follow-up of pulmonary edema; history of coronary artery disease, aortic stenosis, pulmonary hypertension, and end-stage dialysis dependent renal failure.  EXAM: PORTABLE CHEST - 1 VIEW  COMPARISON:  Portable chest x-ray of June 30, 2014  FINDINGS: The lungs arebetter inflated today. The interstitial markings remain increased but become less prominent. The central pulmonary vascularity is less engorged. The cardiac silhouette remains enlarged. There is no significant pleural effusion. The dialysis catheter tip projects over the distal portion of the SVC. The feeding tube tip projects below the inferior margin of the image.  IMPRESSION: Mild interval improvement in the appearance of the pulmonary interstitium consistent with improving CHF.   Electronically Signed   By: David  [place]   On: 07/01/2014 08:49   Dg Abd Portable 1v  06/30/2014   CLINICAL DATA:  Feeding tube placement.  EXAM: PORTABLE ABDOMEN - 1 VIEW  COMPARISON:  Chest and abdomen radiographs obtained earlier today.  FINDINGS: Feeding tube tip in the distal stomach in the region of the gastric pylorus. Paucity of intestinal gas with no dilated bowel loops seen. Probable enlarged liver. Left jugular catheter tip in the inferior right atrium. Enlarged cardiac silhouette. No significant change in bibasilar airspace opacity  IMPRESSION: 1. Feeding tube tip in the distal stomach. 2. Probable hepatomegaly. 3. Cardiomegaly and bibasilar pneumonia or patchy atelectasis.   Electronically Signed   By: Gordan PaymentSteve  Reid M.D.   On: 06/30/2014 21:37    Scheduled Meds: . antiseptic oral rinse  7 mL Mouth Rinse q12n4p  . cefTAZidime (FORTAZ)  IV  2 g Intravenous Once  . chlorhexidine  15 mL Mouth Rinse BID  . darbepoetin (ARANESP) injection - DIALYSIS   25 mcg Intravenous Once  . [START ON 07/04/2014] doxercalciferol  5 mcg Intravenous Q M,W,F-HD  . doxercalciferol  5 mcg Intravenous Q Sat-HD  . feeding supplement (PRO-STAT SUGAR  FREE 64)  30 mL Oral q morning - 10a  . [START ON 07/06/2014] ferric gluconate (FERRLECIT/NULECIT) IV  62.5 mg Intravenous Q Wed-HD  . heparin  5,000 Units Subcutaneous 3 times per day  . insulin aspart  0-9 Units Subcutaneous 6 times per day  . pneumococcal 23 valent vaccine  0.5 mL Intramuscular Tomorrow-1000  . prednisoLONE acetate  1 drop Both Eyes TID  . vancomycin  750 mg Intravenous Once   Continuous Infusions: . diltiazem (CARDIZEM) infusion 5 mg/hr (07/02/14 1137)  . feeding supplement (NEPRO CARB STEADY) 1,000 mL (07/02/14 0800)    Active Problems:   Cardiac arrest    Time spent: 35 min    Jeralyn BennettZAMORA, Briya Lookabaugh  [facility] Pager [phone]. If 7PM-7AM, please contact night-coverage at [email or web address], password [email or web address] 07/02/2014, 1:40 PM  LOS: 5 days

## 2014-07-03 DIAGNOSIS — I480 Paroxysmal atrial fibrillation: Secondary | ICD-10-CM

## 2014-07-03 LAB — GLUCOSE, CAPILLARY
GLUCOSE-CAPILLARY: 196 mg/dL — AB (ref 70–99)
GLUCOSE-CAPILLARY: 125 mg/dL — AB (ref 70–99)
GLUCOSE-CAPILLARY: 133 mg/dL — AB (ref 70–99)
Glucose-Capillary: 190 mg/dL — ABNORMAL HIGH (ref 70–99)
Glucose-Capillary: 191 mg/dL — ABNORMAL HIGH (ref 70–99)
Glucose-Capillary: 217 mg/dL — ABNORMAL HIGH (ref 70–99)
Glucose-Capillary: 147 mg/dL — ABNORMAL HIGH (ref 70–99)

## 2014-07-03 MED ORDER — ACETAMINOPHEN 160 MG/5ML PO SOLN
650.0000 mg | ORAL | Status: DC | PRN
  Administered 2014-07-03: 650 mg via ORAL
  Filled 2014-07-03: qty 20.3

## 2014-07-03 MED ORDER — PENTAFLUOROPROP-TETRAFLUOROETH EX AERO: 1.0000 "application " | INHALATION_SPRAY | CUTANEOUS | Status: DC | PRN

## 2014-07-03 MED ORDER — LIDOCAINE-PRILOCAINE 2.5-2.5 % EX CREA
1.0000 | TOPICAL_CREAM | CUTANEOUS | Status: DC | PRN
Start: 2014-07-03 — End: 2014-07-03

## 2014-07-03 MED ORDER — LIDOCAINE HCL (PF) 1 % IJ SOLN: 5.0000 mL | INTRAMUSCULAR | Status: DC | PRN

## 2014-07-03 MED ORDER — SODIUM CHLORIDE 0.9 % IV SOLN: 100.0000 mL | INTRAVENOUS | Status: DC | PRN

## 2014-07-03 MED ORDER — PENTAFLUOROPROP-TETRAFLUOROETH EX AERO
1.0000 | INHALATION_SPRAY | CUTANEOUS | Status: DC | PRN
Start: 2014-07-03 — End: 2014-07-07

## 2014-07-03 MED ORDER — NEPRO/CARBSTEADY PO LIQD: 237.0000 mL | ORAL | Status: DC | PRN

## 2014-07-03 MED ORDER — ALTEPLASE 2 MG IJ SOLR: 2.0000 mg | Freq: Once | INTRAMUSCULAR | Status: DC | PRN

## 2014-07-03 MED ORDER — HEPARIN SODIUM (PORCINE) 1000 UNIT/ML DIALYSIS: 1000.0000 [IU] | INTRAMUSCULAR | Status: DC | PRN

## 2014-07-03 MED ORDER — METOPROLOL TARTRATE 25 MG PO TABS
25.0000 mg | ORAL_TABLET | Freq: Four times a day (QID) | ORAL | Status: DC
  Filled 2014-07-03 (×4): qty 1

## 2014-07-03 MED ORDER — HEPARIN SODIUM (PORCINE) 1000 UNIT/ML DIALYSIS
1000.0000 [IU] | INTRAMUSCULAR | Status: DC | PRN
Start: 2014-07-03 — End: 2014-07-03

## 2014-07-03 MED ORDER — METOPROLOL TARTRATE 25 MG PO TABS: 25.0000 mg | ORAL_TABLET | Freq: Four times a day (QID) | ORAL | Status: DC

## 2014-07-03 MED ORDER — LIDOCAINE-PRILOCAINE 2.5-2.5 % EX CREA: 1.0000 "application " | TOPICAL_CREAM | CUTANEOUS | Status: DC | PRN

## 2014-07-03 MED ORDER — HEPARIN SODIUM (PORCINE) 1000 UNIT/ML DIALYSIS
2000.0000 [IU] | INTRAMUSCULAR | Status: DC | PRN
  Filled 2014-07-03: qty 2

## 2014-07-03 MED ORDER — SODIUM CHLORIDE 0.9 % IV SOLN
100.0000 mL | INTRAVENOUS | Status: DC | PRN
Start: 2014-07-03 — End: 2014-07-03

## 2014-07-03 MED ORDER — ALTEPLASE 2 MG IJ SOLR
2.0000 mg | Freq: Once | INTRAMUSCULAR | Status: AC | PRN
  Administered 2014-07-03: 2 mg
  Filled 2014-07-03 (×2): qty 2

## 2014-07-03 MED ORDER — HEPARIN SODIUM (PORCINE) 1000 UNIT/ML DIALYSIS
4000.0000 [IU] | Freq: Once | INTRAMUSCULAR | Status: AC
  Administered 2014-07-03: 4000 [IU] via INTRAVENOUS_CENTRAL

## 2014-07-03 MED ORDER — HEPARIN SODIUM (PORCINE) 1000 UNIT/ML DIALYSIS
1000.0000 [IU] | INTRAMUSCULAR | Status: DC | PRN
  Filled 2014-07-03: qty 1

## 2014-07-03 MED ORDER — NEPRO/CARBSTEADY PO LIQD
237.0000 mL | ORAL | Status: DC | PRN
Start: 2014-07-03 — End: 2014-07-03

## 2014-07-03 MED ORDER — HEPARIN SODIUM (PORCINE) 1000 UNIT/ML DIALYSIS
5000.0000 [IU] | Freq: Once | INTRAMUSCULAR | Status: DC
  Filled 2014-07-03: qty 5

## 2014-07-03 MED ORDER — HEPARIN SODIUM (PORCINE) 1000 UNIT/ML DIALYSIS: 4000.0000 [IU] | Freq: Once | INTRAMUSCULAR | Status: DC

## 2014-07-03 NOTE — Progress Notes (Addendum)
[facility] Progress Note   Subjective: tearful, wants a glass of H2O. Unable to do HD last night due to hypotension  Filed Vitals:   07/03/14 0400 07/03/14 0745 07/03/14 0800 07/03/14 0937  BP: 136/43  135/51 141/52  Pulse: 85  53 79  Temp:  98.2 F (36.8 C)    TempSrc:  Oral    Resp:  20 18   Height:      Weight:      SpO2: 99% 99% 98% 100%   Exam: Awake, difficulty verbalizing but recognizes me No jvd  Chest mostly clear, occ scattered rales RRR 2/6 SEM no gallop  Abd obese, soft, NTND, no mass  L BKA, no LE edema L IJ TDC intact; LUA AVF +bruit , ecchymosis improving Neuro as above  HD: MWF  F180 4h 75kg 400/800 2/2.0 [place] Heparin 5000  Aranesp 25 every 2 weeks (last 10/07), Venofer 50 mg / wed, Hectorol 5 ug TIW   Assessment:  1 Cardioresp arrest - s/p cooling protocol 2 Pulm edema  - resolved 3 AMS - some improvement 4 Afib - back in NSR, dilt changed to lopressor short-acting 25 qid 5 ESRD on HD 6 Access - infiltrated AVF, has new TDC L IJ; should be able to use AVF soon 7 Anemia Hb down to 10.0, continuing aranesp 8 PNA suspected, on emp AB 9 HTN - try to keep SBP over 115-120 if possible 10 DM per prim  11 HPTH cont vit D, resume Renvela 2 ac tid when eating  12 CM EF 35% 13 Severe CAD, not surg candidate / severe ASCVD, not candidate for procedure d/t GIB on antilplatelets   Plan- BP better today, plan short HD today w no fluid off and usual Rx tomorrow. Hold order placed on MTP to avoid low BP's    Vinson Moselleob Kada Friesen MD  pager [phone]    cell [phone]  07/03/2014, 9:44 AM     Recent Labs Lab 06/30/14 1045 07/01/14 0445 07/02/14 0220  NA 136* 139 141  K 4.6 5.0 5.3  CL 95* 96 99  CO2 25 21 20   GLUCOSE 131* 152* 228*  BUN 27* 42* 74*  CREATININE 4.94* 6.39* 7.82*  CALCIUM 9.5 9.7 9.4    Recent Labs Lab 06/27/14 1125  AST 49*  ALT 44*  ALKPHOS 247*  BILITOT 0.5  PROT 8.2  ALBUMIN 3.8    Recent Labs Lab  06/27/14 1125  06/29/14 0450 06/30/14 1045 07/01/14 0445  WBC 7.5  < > 12.8* 11.2* 9.5  NEUTROABS 5.0  --  11.4*  --  7.5  HGB 12.0  < > 12.9 11.2* 10.1*  HCT 36.1  < > 37.6 33.7* 31.1*  MCV 93.8  < > 94.2 96.8 94.8  PLT 174  < > 209 182 165  < > = values in this interval not displayed. Marland Kitchen. antiseptic oral rinse  7 mL Mouth Rinse q12n4p  . chlorhexidine  15 mL Mouth Rinse BID  . darbepoetin (ARANESP) injection - DIALYSIS  25 mcg Intravenous Once  . [START ON 07/04/2014] doxercalciferol  5 mcg Intravenous Q M,W,F-HD  . doxercalciferol  5 mcg Intravenous Q Sat-HD  . feeding supplement (PRO-STAT SUGAR FREE 64)  30 mL Oral q morning - 10a  . [START ON 07/06/2014] ferric gluconate (FERRLECIT/NULECIT) IV  62.5 mg Intravenous Q Wed-HD  . heparin  5,000 Units Subcutaneous 3 times per day  . insulin aspart  0-9 Units Subcutaneous 6 times per day  . metoprolol  tartrate  25 mg Oral Q6H  . pneumococcal 23 valent vaccine  0.5 mL Intramuscular Tomorrow-1000  . prednisoLONE acetate  1 drop Both Eyes TID   . feeding supplement (NEPRO CARB STEADY) 1,000 mL (07/02/14 1709)   labetalol

## 2014-07-03 NOTE — Progress Notes (Signed)
SUBJECTIVE: The patient is doing reasonably today.  Lethargic.  No complaints of chest pain this am.  . antiseptic oral rinse  7 mL Mouth Rinse q12n4p  . chlorhexidine  15 mL Mouth Rinse BID  . darbepoetin (ARANESP) injection - DIALYSIS  25 mcg Intravenous Once  . [START ON 07/04/2014] doxercalciferol  5 mcg Intravenous Q M,W,F-HD  . doxercalciferol  5 mcg Intravenous Q Sat-HD  . feeding supplement (PRO-STAT SUGAR FREE 64)  30 mL Oral q morning - 10a  . [START ON 07/06/2014] ferric gluconate (FERRLECIT/NULECIT) IV  62.5 mg Intravenous Q Wed-HD  . heparin  5,000 Units Subcutaneous 3 times per day  . insulin aspart  0-9 Units Subcutaneous 6 times per day  . metoprolol tartrate  25 mg Oral Q6H  . pneumococcal 23 valent vaccine  0.5 mL Intramuscular Tomorrow-1000  . prednisoLONE acetate  1 drop Both Eyes TID   . feeding supplement (NEPRO CARB STEADY) 1,000 mL (07/02/14 1709)    OBJECTIVE: Physical Exam: Filed Vitals:   07/03/14 0745 07/03/14 0800 07/03/14 0937 07/03/14 1044  BP:  135/51 141/52 121/46  Pulse:  53 79 66  Temp: 98.2 F (36.8 C)     TempSrc: Oral     Resp: 20 18    Height:      Weight:      SpO2: 99% 98% 100% 100%    Intake/Output Summary (Last 24 hours) at 07/03/14 1140 Last data filed at 07/03/14 0800  Gross per 24 hour  Intake   1005 ml  Output      0 ml  Net   1005 ml    Telemetry reveals sinus rhythm  GEN- The patient is chronically ill appearing, arouses, not able to provide history (history per her daughter) Head- normocephalic, atraumatic Eyes-  Sclera clear, conjunctiva pink Ears- hearing intact Oropharynx- clear Neck- supple,+ JVD Lungs- Clear to ausculation bilaterally, normal work of breathing Heart- Regular rate and rhythm  GI- soft, NT, ND, + BS Extremities- no clubbing, cyanosis, + dependant edema Skin- no rash or lesion Psych- euthymic mood, full affect Neuro- strength and sensation are intact  LABS: Basic Metabolic  Panel:  Recent Labs  07/01/14 0445 07/02/14 0220  NA 139 141  K 5.0 5.3  CL 96 99  CO2 21 20  GLUCOSE 152* 228*  BUN 42* 74*  CREATININE 6.39* 7.82*  CALCIUM 9.7 9.4   Liver Function Tests: No results found for this basename: AST, ALT, ALKPHOS, BILITOT, PROT, ALBUMIN,  in the last 72 hours No results found for this basename: LIPASE, AMYLASE,  in the last 72 hours CBC:  Recent Labs  07/01/14 0445  WBC 9.5  NEUTROABS 7.5  HGB 10.1*  HCT 31.1*  MCV 94.8  PLT 165   Impression/Recommendations   1. CAD - no ischemic symptoms presently - from review of several notes, including most recent Dr Earmon Phoenixooper's clinic note 12/07/13 the patient based on her comorbidities and coronary anatomy does not have revascularization options as far as her severe CAD - unclear if CAD related to this event, from H&P and family at bedside today, initial concern was possible respiratory initiated arrest. No documented ventricular arrhythmias, echo overall stable, history suggests possible respiratory event related to sedation - continue current medical therapy for CAD, she is not a candidate for revasc.     2. HCAP - abx per primary team  3. Afib Now in sinus rhythm Not a candidate for anticoagulation Continue metoprolol  4. ESRD - per  renal   No new recs Please call with questions Will be available as needed   Hillis RangeAllred, Marquie Aderhold, MD 07/03/2014 11:40 AM

## 2014-07-03 NOTE — Progress Notes (Signed)
Pt in HD unit for hemodialysis Tx. Was informed that her BP is to be >130. Pts BP taken and was initially 100/49. BP retaken and was 99/39. Dr. Arlean HoppingSchertz was called and informed of her current BP's. This was prior to pt being put on the hemodialysis machine. Dr. Janan HalterSchertx orders for Tx to be discontinued due to her BP being low.

## 2014-07-03 NOTE — Progress Notes (Addendum)
[name], female   DOB: [birth year], 68 y.o.   MRN: [id number] [facility] PROGRESS NOTE  Briana Gould WUJ:[id number] DOB: [birth year] DOA: 06/27/2014 PCP: Laurena SlimmerLARK,PRESTON S, MD  Brief narrative: 68 year old female with multiple comorbidities including severe multivessel CAD, not candidate for percutaneous intervention, ESRD on HD, PAD status post right BKA, [facility]. She was on hemodialysis catheter placed, became pulseless, received CPR for 2 minutes with return of spontaneous circulation. This was her third cardiac arrest. She was placed under the hypothermia protocol. On 06/29/2014 she was rewarmed and extubated. Patient remains lethargic and nonverbal and there is concern that there may be an anoxic encephalopathy. Pulmonary critical care medicine discussed goals of care with family as they desire for FULL CODE STATUS. Patient has also been evaluated by nephrology during this hospitalization for hemodialysis. On 07/02/2014, cardiology consultation requested.   Assessment and Plan:   Cardiac arrest - Patient with history of severe multivessel CAD, found not to be candidate for PCI, having a history of cardiac arrest x 2  - Cardiology consulted  - no ischemic symptoms presently   - continue current medical therapy for CAD, she is not a candidate for revasc.  - appreciate cardiology input  Acute respiratory failure secondary to Healthcare associated pneumonia  - Chest x-ray 06/30/2014 showing bibasilar pneumonia versus patchy atelectasis  - Continue Ceftaz 2 g IV and Vancomycin with HD sessions, ABX started 10/22 - WBC trending down and WNL this AM - Blood cultures obtained on 06/28/2014 showing no growth x2 sets  - Supportive care  Atrial fibrillation rapid ventricular response  - Patient going into A. fib with RVR, in NSR this AM - continue Metoprolol per cardiology recommendations  End-stage renal disease  - Nephrology consulted,  patient receiving hemodialysis  - Has new tunnel dialysis catheter  - plan for HD today  Severe multivessel coronary artery disease  - Patient's not candidate for percutaneous intervention or coronary artery bypass grafting  - conservative management Encephalopathy  - Patient remains nonverbal, unable to follow commands - Could be secondary to hypoxic brain injury, versus delirium  Severe PCM - continue tube feeding for now  Acute functional quadriplegia - secondary to medical conditions outlined above - attempt PT/OT once pt able to participate  Anemia of CKD - slight drop in Hg since admission - no signs of active bleeding, repeat CBC in AM Type 2 diabetes mellitus  - Continue Accu-Cheks every 4 hours with sliding scale coverage   DVT prophylaxis  Heparin SQ while pt is in hospital with HD  Code Status: Full Family Communication: No family at bedside  Disposition Plan: Remains in SDU   IV Access:    Peripheral IV Procedures and diagnostic studies:    C10/24/2015  No significant change, moderate-to-marked cardiomegaly and mild diffuse interstitial pulmonary edema.   10/19 >10/20 hypothermia protocol   10/20 HD   10/21 rewarmed, making some purposeful movements, extubated  Medical Consultants:    Cardiology  Nephrology  PCCM Other Consultants:    None Anti-Infectives:    Vancomycin 10/22  -->   Ceftaz 10/22 -->  Debbora PrestoMAGICK-Tristian Sickinger, MD  [facility] Pager [phone]  If 7PM-7AM, please contact night-coverage [email or web address] Password [email or web address] 07/03/2014, 3:17 PM   LOS: 6 days   HPI/Subjective: No events overnight.   Objective: Filed Vitals:   07/03/14 0937 07/03/14 1044 07/03/14 1200 07/03/14 1315  BP: 141/52 121/46 130/76 109/36  Pulse: 79 66 71 68  Temp:   98.2  F (36.8 C)   TempSrc:   Oral   Resp:   18   Height:      Weight:      SpO2: 100% 100% 99% 100%    Intake/Output Summary (Last 24 hours) at 07/03/14 1517 Last data filed at 07/03/14 1300   Gross per 24 hour  Intake    950 ml  Output      0 ml  Net    950 ml    Exam:   General:  Pt is still non verbal and does not follow any commands   Cardiovascular: Regular rhythm, no rubs, no gallops  Respiratory: Bibasilar rhonchi, diminished air movement at bases   Abdomen: Soft, non tender, non distended, bowel sounds present, no guarding  Data Reviewed: Basic Metabolic Panel:  Recent Labs Lab 06/28/14 2339 06/29/14 0411 06/30/14 1045 07/01/14 0445 07/02/14 0220  NA 137 139 136* 139 141  K 4.4 5.0 4.6 5.0 5.3  CL 95* 96 95* 96 99  CO2 22 20 25 21 20   GLUCOSE 146* 147* 131* 152* 228*  BUN 24* 26* 27* 42* 74*  CREATININE 4.03* 4.46* 4.94* 6.39* 7.82*  CALCIUM 9.6 9.6 9.5 9.7 9.4   Liver Function Tests:  Recent Labs Lab 06/27/14 1125  AST 49*  ALT 44*  ALKPHOS 247*  BILITOT 0.5  PROT 8.2  ALBUMIN 3.8   CBC:  Recent Labs Lab 06/27/14 1125  06/27/14 1822 06/28/14 1000 06/29/14 0450 06/30/14 1045 07/01/14 0445  WBC 7.5  --   --  5.5 12.8* 11.2* 9.5  NEUTROABS 5.0  --   --   --  11.4*  --  7.5  HGB 12.0  < > 13.6 10.8* 12.9 11.2* 10.1*  HCT 36.1  < > 40.0 31.6* 37.6 33.7* 31.1*  MCV 93.8  --   --  92.7 94.2 96.8 94.8  PLT 174  --   --  146* 209 182 165  < > = values in this interval not displayed. Cardiac Enzymes:  Recent Labs Lab 06/27/14 1125  TROPONINI <0.30   CBG:  Recent Labs Lab 07/02/14 2314 07/03/14 0318 07/03/14 0519 07/03/14 0748 07/03/14 1217  GLUCAP 235* 196* 190* 191* 217*    Recent Results (from the past 240 hour(s))  MRSA PCR SCREENING     Status: None   Collection Time    06/27/14  2:18 PM      Result Value Ref Range Status   MRSA by PCR NEGATIVE  NEGATIVE Final   Comment:            The GeneXpert MRSA Assay (FDA     approved for NASAL specimens     only), is one component of a     comprehensive MRSA colonization     surveillance program. It is not     intended to diagnose MRSA     infection nor to guide or      monitor treatment for     MRSA infections.  CULTURE, BLOOD (ROUTINE X 2)     Status: None   Collection Time    06/30/14 12:20 PM      Result Value Ref Range Status   Specimen Description BLOOD RIGHT HAND   Final   Special Requests BOTTLES DRAWN AEROBIC AND ANAEROBIC 5CC   Final   Culture  Setup Time     Final   Value: 06/30/2014 17:34     Performed at [facility]   Culture     Final  Value: STAPHYLOCOCCUS SPECIES (COAGULASE NEGATIVE)     Note: THE SIGNIFICANCE OF ISOLATING THIS ORGANISM FROM A SINGLE SET OF BLOOD CULTURES WHEN MULTIPLE SETS ARE DRAWN IS UNCERTAIN. PLEASE NOTIFY THE MICROBIOLOGY DEPARTMENT WITHIN ONE WEEK IF SPECIATION AND SENSITIVITIES ARE REQUIRED.     Note: Gram Stain Report Called to,Read Back By and Verified With: TONYA@12 :16PM ON 07/01/14 BY DANTS     Performed at [facility]   Report Status 07/02/2014 FINAL   Final  CULTURE, BLOOD (ROUTINE X 2)     Status: None   Collection Time    06/30/14 12:26 PM      Result Value Ref Range Status   Specimen Description BLOOD RIGHT THUMB   Final   Special Requests BOTTLES DRAWN AEROBIC ONLY 5CC   Final   Culture  Setup Time     Final   Value: 06/30/2014 17:35     Performed at [facility]   Culture     Final   Value:        BLOOD CULTURE RECEIVED NO GROWTH TO DATE CULTURE WILL BE HELD FOR 5 DAYS BEFORE ISSUING A FINAL NEGATIVE REPORT     Performed at [facility]   Report Status PENDING   Incomplete     Scheduled Meds: . darbepoetin (ARANESP) injection - DIALYSIS  25 mcg Intravenous Once  . doxercalciferol  5 mcg Intravenous Q Sat-HD  . heparin  4,000 Units Dialysis Once in dialysis  . [START ON 07/04/2014] heparin  5,000 Units Dialysis Once in dialysis  . insulin aspart  0-9 Units Subcutaneous 6 times per day  . metoprolol tartrate  25 mg Oral Q6H  . prednisoLONE acetate  1 drop Both Eyes TID   Continuous Infusions: . feeding supplement (NEPRO CARB STEADY) 1,000 mL (07/02/14  1709)

## 2014-07-04 LAB — BASIC METABOLIC PANEL
Anion gap: 21 — ABNORMAL HIGH (ref 5–15)
BUN: 104 mg/dL — ABNORMAL HIGH (ref 6–23)
CO2: 23 meq/L (ref 19–32)
Calcium: 9.4 mg/dL (ref 8.4–10.5)
Chloride: 100 mEq/L (ref 96–112)
Creatinine, Ser: 8.95 mg/dL — ABNORMAL HIGH (ref 0.50–1.10)
GFR calc Af Amer: 5 mL/min — ABNORMAL LOW (ref >90)
GFR, EST NON AFRICAN AMERICAN: 4 mL/min — AB (ref >90)
GLUCOSE: 155 mg/dL — AB (ref 70–99)
POTASSIUM: 5.1 meq/L (ref 3.7–5.3)
SODIUM: 144 meq/L (ref 137–147)

## 2014-07-04 LAB — VANCOMYCIN, RANDOM: Vancomycin Rm: 23 ug/mL

## 2014-07-04 LAB — CBC
HCT: 28.9 % — ABNORMAL LOW (ref 36.0–46.0)
Hemoglobin: 9.5 g/dL — ABNORMAL LOW (ref 12.0–15.0)
MCH: 31.3 pg (ref 26.0–34.0)
MCHC: 32.9 g/dL (ref 30.0–36.0)
MCV: 95.1 fL (ref 78.0–100.0)
Platelets: 162 10*3/uL (ref 150–400)
RBC: 3.04 MIL/uL — AB (ref 3.87–5.11)
RDW: 15.4 % (ref 11.5–15.5)
WBC: 8.1 10*3/uL (ref 4.0–10.5)

## 2014-07-04 LAB — GLUCOSE, CAPILLARY
GLUCOSE-CAPILLARY: 138 mg/dL — AB (ref 70–99)
Glucose-Capillary: 151 mg/dL — ABNORMAL HIGH (ref 70–99)
Glucose-Capillary: 167 mg/dL — ABNORMAL HIGH (ref 70–99)
Glucose-Capillary: 147 mg/dL — ABNORMAL HIGH (ref 70–99)

## 2014-07-04 MED ORDER — DEXTROSE 5 % IV SOLN
2.0000 g | Freq: Once | INTRAVENOUS | Status: AC
  Administered 2014-07-04: 2 g via INTRAVENOUS
  Filled 2014-07-04 (×2): qty 2

## 2014-07-04 MED ORDER — MIDODRINE HCL 5 MG PO TABS
5.0000 mg | ORAL_TABLET | ORAL | Status: DC
  Administered 2014-07-06: 5 mg via ORAL
  Filled 2014-07-04 (×2): qty 1

## 2014-07-04 MED ORDER — DOXERCALCIFEROL 4 MCG/2ML IV SOLN
INTRAVENOUS | Status: AC
  Administered 2014-07-04: 5 ug via INTRAVENOUS
  Filled 2014-07-04: qty 4

## 2014-07-04 MED ORDER — ASPIRIN 81 MG PO CHEW
81.0000 mg | CHEWABLE_TABLET | Freq: Every day | ORAL | Status: DC
  Administered 2014-07-06 – 2014-07-07 (×2): 81 mg via ORAL
  Filled 2014-07-04 (×2): qty 1

## 2014-07-04 MED ORDER — METOPROLOL TARTRATE 1 MG/ML IV SOLN
5.0000 mg | Freq: Four times a day (QID) | INTRAVENOUS | Status: DC
  Administered 2014-07-05 – 2014-07-07 (×8): 5 mg via INTRAVENOUS
  Filled 2014-07-04 (×14): qty 5

## 2014-07-04 MED ORDER — FLUTICASONE PROPIONATE HFA 110 MCG/ACT IN AERO
2.0000 | INHALATION_SPRAY | Freq: Two times a day (BID) | RESPIRATORY_TRACT | Status: DC
  Administered 2014-07-05 – 2014-07-07 (×4): 2 via RESPIRATORY_TRACT
  Filled 2014-07-04: qty 12

## 2014-07-04 MED ORDER — VANCOMYCIN HCL IN DEXTROSE 750-5 MG/150ML-% IV SOLN
750.0000 mg | Freq: Once | INTRAVENOUS | Status: AC
  Administered 2014-07-04: 750 mg via INTRAVENOUS
  Filled 2014-07-04 (×2): qty 150

## 2014-07-04 MED ORDER — DARBEPOETIN ALFA-POLYSORBATE 25 MCG/0.42ML IJ SOLN
INTRAMUSCULAR | Status: AC
  Filled 2014-07-04: qty 0.42

## 2014-07-04 NOTE — Progress Notes (Signed)
NUTRITION FOLLOW UP  Intervention:   -RD to follow for diet advancement -Supplement diet as appropriate  Nutrition Dx:   Inadequate oral intake related to inability to eat as evidenced by NPO.   Goal:   Pt to meet >/= 90% of their estimated nutrition needs   Monitor:   Diet advancement, PO intake, labs, weight changes, I/O's  Assessment:   Briana Gould is an 68 y.o. female s/p cardiopulmonary arrest after catheter placement. Patient placed under hypothermia protocol (10/19-10/20) and now extubated (10/21.)  Chart reviewed. Pt pulled out NGT this AM. Pt is currently awaiting SLP consult to assess readiness for PO diet.  Noted a 8.9% wt gain x 3 days. Wt gain likely due to fluid retention. Pt with ESRD on HD.  Labs reviewed. BUN/Creat: 104/8.95, Glucose:155, CBGS:147-167. K WDL.   Height: Ht Readings from Last 1 Encounters:  06/27/14 5' (1.524 m)    Weight Status:   Wt Readings from Last 1 Encounters:  07/04/14 175 lb 4.3 oz (79.5 kg)  07/01/14  161 lb 9.6 oz (73.3 kg)    Re-estimated needs:  Kcal: 1900-2100 Protein: 78-88 grams Fluid: 1-1.5 L  Skin: skin tears on lt abdomen and rt leg  Diet Order:  NPO   Intake/Output Summary (Last 24 hours) at 07/04/14 1222 Last data filed at 07/04/14 0600  Gross per 24 hour  Intake    710 ml  Output      0 ml  Net    710 ml    Last BM: 07/01/14   Labs:   Recent Labs Lab 07/01/14 0445 07/02/14 0220 07/04/14 0303  NA 139 141 144  K 5.0 5.3 5.1  CL 96 99 100  CO2 21 20 23   BUN 42* 74* 104*  CREATININE 6.39* 7.82* 8.95*  CALCIUM 9.7 9.4 9.4  GLUCOSE 152* 228* 155*    CBG (last 3)   Recent Labs  07/04/14 0326 07/04/14 0733 07/04/14 1131  GLUCAP 151* 167* 147*    Scheduled Meds: . antiseptic oral rinse  7 mL Mouth Rinse q12n4p  . aspirin  81 mg Oral Daily  . cefTAZidime (FORTAZ)  IV  2 g Intravenous Once  . chlorhexidine  15 mL Mouth Rinse BID  . darbepoetin (ARANESP) injection - DIALYSIS  25  mcg Intravenous Once  . doxercalciferol  5 mcg Intravenous Q M,W,F-HD  . doxercalciferol  5 mcg Intravenous Q Sat-HD  . feeding supplement (PRO-STAT SUGAR FREE 64)  30 mL Oral q morning - 10a  . [START ON 07/06/2014] ferric gluconate (FERRLECIT/NULECIT) IV  62.5 mg Intravenous Q Wed-HD  . fluticasone  2 puff Inhalation BID  . heparin  5,000 Units Dialysis Once in dialysis  . insulin aspart  0-9 Units Subcutaneous 6 times per day  . metoprolol tartrate  25 mg Oral Q6H  . midodrine  5 mg Oral Q M,W,F-HD  . pneumococcal 23 valent vaccine  0.5 mL Intramuscular Tomorrow-1000  . prednisoLONE acetate  1 drop Both Eyes TID  . vancomycin  750 mg Intravenous Once    Continuous Infusions: . feeding supplement (NEPRO CARB STEADY) 1,000 mL (07/03/14 1819)    Bensyn Bornemann A. Mayford KnifeWilliams, RD, LDN Pager: [phone] After hours Pager: [phone]

## 2014-07-04 NOTE — Evaluation (Signed)
Occupational Therapy Evaluation Patient Details Name: Briana Gould MRN: [id number] DOB: [birth year] Today's Date: 07/04/2014    History of Present Illness Pt admitted with cardiac arrest, acute respiratory failure.  PMH: cardiac arrests, CAD, ESRD, PAD with s/p BKA.   Clinical Impression   No family available for PLOF, pt reports she lives alone after recent death of her husband.  Pt presents with total dependence in all ADL.  She requires +2 assist for bed mobility and demonstrates poor sitting balance.  Pt with impaired cognition with disorientation and decreased direction following. Will follow acutely.  Follow Up Recommendations  SNF;Supervision/Assistance - 24 hour    Equipment Recommendations       Recommendations for Other Services       Precautions / Restrictions Precautions Precautions: Fall Restrictions Weight Bearing Restrictions: No      Mobility Bed Mobility Overal bed mobility: +2 for physical assistance;Needs Assistance Bed Mobility: Rolling;Sidelying to Sit;Sit to Supine Rolling: +2 for physical assistance;Total assist Sidelying to sit: +2 for physical assistance;Total assist   Sit to supine: +2 for physical assistance;Total assist      Transfers                      Balance Overall balance assessment: Needs assistance Sitting-balance support: Feet supported Sitting balance-Leahy Scale: Poor   Postural control: Left lateral lean                                  ADL Overall ADL's : Needs assistance/impaired Eating/Feeding: NPO                                   Functional mobility during ADLs: +2 for physical assistance;Total assistance General ADL Comments: Pt with total dependence in ADL.  Pt unaware of BM.     Vision                 Additional Comments: L preference, but able to turn her head to R to locate therapist on her R side.   Perception     Praxis      Pertinent Vitals/Pain  Pain Assessment: Faces Faces Pain Scale: Hurts a little bit Pain Location: grimaced when moved, but did not report specific pain Pain Descriptors / Indicators: Grimacing Pain Intervention(s): Monitored during session;Repositioned     Hand Dominance Right   Extremity/Trunk Assessment Upper Extremity Assessment Upper Extremity Assessment: Difficult to assess due to impaired cognition;Generalized weakness   Lower Extremity Assessment Lower Extremity Assessment: Defer to PT evaluation       Communication     Cognition Arousal/Alertness: Awake/alert Behavior During Therapy: Flat affect Overall Cognitive Status: No family/caregiver present to determine baseline cognitive functioning Area of Impairment: Orientation;Attention;Memory;Following commands;Awareness;Problem solving Orientation Level: Disoriented to;Place;Time;Situation Current Attention Level: Sustained Memory: Decreased short-term memory Following Commands: Follows one step commands inconsistently;Follows one step commands with increased time   Awareness:  (pre intellectual) Problem Solving: Slow processing;Difficulty sequencing;Decreased initiation;Requires verbal cues;Requires tactile cues     General Comments       Exercises       Shoulder Instructions      Home Living Family/patient expects to be discharged to:: Unsure Living Arrangements: Children  Additional Comments: Husband recently deceased. Pt unable to offer PLOF.      Prior Functioning/Environment               OT Diagnosis: Generalized weakness;Cognitive deficits;Acute pain   OT Problem List: Decreased strength;Decreased activity tolerance;Impaired balance (sitting and/or standing);Decreased cognition;Decreased knowledge of use of DME or AE;Obesity;Impaired UE functional use;Pain;Decreased coordination   OT Treatment/Interventions: Self-care/ADL training;Therapeutic exercise;Therapeutic  activities;Balance training;Patient/family education;Cognitive remediation/compensation    OT Goals(Current goals can be found in the care plan section) Acute Rehab OT Goals OT Goal Formulation: Patient unable to participate in goal setting Time For Goal Achievement: 07/18/14 Potential to Achieve Goals: Good ADL Goals Pt Will Perform Grooming: with mod assist;sitting Additional ADL Goal #1: Pt will sit EOB x 5 minutes with supervision. Additional ADL Goal #2: Pt will perform bed mobility with moderate assist in preparation for ADL and to assist with pericare. Additional ADL Goal #3: Pt will follow one step commands with minimal verbal and physical cues.  OT Frequency: Min 2X/week   Barriers to D/C:            Co-evaluation              End of Session Nurse Communication: Other (comment) (aware of BM)  Activity Tolerance: Patient limited by fatigue Patient left: in bed;with call bell/phone within reach;with bed alarm set   Time: [id number] OT Time Calculation (min): 38 min Charges:  OT General Charges $OT Visit: 1 Procedure OT Evaluation $Initial OT Evaluation Tier I: 1 Procedure OT Treatments $Therapeutic Activity: 8-22 mins G-Codes:    Evern BioMayberry, Briana Gould 07/04/2014, 11:34 AM [phone]

## 2014-07-04 NOTE — Evaluation (Signed)
Physical Therapy Evaluation Patient Details Name: Briana Gould MRN: [id number] DOB: 1946/09/08 Today's Date: 07/04/2014   History of Present Illness  Pt admitted with cardiac arrest, acute respiratory failure.  PMH: cardiac arrests, CAD, ESRD, PAD with s/p BKA.  Clinical Impression  Pt admitted with above. Pt currently with functional limitations due to the deficits listed below (see PT Problem List).  Pt will benefit from skilled PT to increase their independence and safety with mobility to allow discharge to the venue listed below.       Follow Up Recommendations SNF    Equipment Recommendations  Other (comment) (To be determined.)    Recommendations for Other Services       Precautions / Restrictions Precautions Precautions: Fall Restrictions Weight Bearing Restrictions: No      Mobility  Bed Mobility Overal bed mobility: +2 for physical assistance;Needs Assistance Bed Mobility: Rolling;Sidelying to Sit;Sit to Supine Rolling: +2 for physical assistance;Total assist Sidelying to sit: +2 for physical assistance;Total assist   Sit to supine: +2 for physical assistance;Total assist   General bed mobility comments: Verbal/tactile cues to reach with UE and to turn head in direction of turn without any success.  Transfers                    Ambulation/Gait                Stairs            Wheelchair Mobility    Modified Rankin (Stroke Patients Only)       Balance Overall balance assessment: Needs assistance Sitting-balance support: Feet supported Sitting balance-Leahy Scale: Poor Sitting balance - Comments: Pt sat EOB x 20 minutes with mod A. Postural control: Left lateral lean                                   Pertinent Vitals/Pain Pain Assessment: Faces Faces Pain Scale: Hurts a little bit Pain Location: grimaced when moved, but did not report specific pain Pain Descriptors / Indicators: Grimacing Pain  Intervention(s): Monitored during session;Repositioned    Home Living Family/patient expects to be discharged to:: Unsure Living Arrangements: Children               Additional Comments: Husband recently deceased. Pt unable to offer PLOF.    Prior Function                 Hand Dominance   Dominant Hand: Right    Extremity/Trunk Assessment   Upper Extremity Assessment: Defer to OT evaluation           Lower Extremity Assessment: RLE deficits/detail;LLE deficits/detail RLE Deficits / Details: strength grossly 2+/5 LLE Deficits / Details: strength grossly 2+/5 for residual limb     Communication      Cognition Arousal/Alertness: Awake/alert Behavior During Therapy: Flat affect Overall Cognitive Status: No family/caregiver present to determine baseline cognitive functioning Area of Impairment: Orientation;Attention;Memory;Following commands;Awareness;Problem solving Orientation Level: Disoriented to;Place;Time;Situation Current Attention Level: Sustained Memory: Decreased short-term memory Following Commands: Follows one step commands inconsistently;Follows one step commands with increased time   Awareness:  (pre intellectual) Problem Solving: Slow processing;Difficulty sequencing;Decreased initiation;Requires verbal cues;Requires tactile cues      General Comments      Exercises        Assessment/Plan    PT Assessment    PT Diagnosis Generalized weakness;Altered mental status;Difficulty walking   PT Problem List  PT Treatment Interventions     PT Goals (Current goals can be found in the Care Plan section) Acute Rehab PT Goals Patient Stated Goal: Pt unable to state. PT Goal Formulation: Patient unable to participate in goal setting Time For Goal Achievement: 07/18/14 Potential to Achieve Goals: Fair    Frequency     Barriers to discharge        Co-evaluation PT/OT/SLP Co-Evaluation/Treatment: Yes Reason for Co-Treatment: For  patient/therapist safety;Necessary to address cognition/behavior during functional activity;Complexity of the patient's impairments (multi-system involvement) PT goals addressed during session: Mobility/safety with mobility;Balance         End of Session   Activity Tolerance: Patient tolerated treatment well Patient left: in bed;with call bell/phone within reach;with bed alarm set Nurse Communication: Mobility status         Time: [id number] PT Time Calculation (min): 38 min   Charges:   PT Evaluation $Initial PT Evaluation Tier I: 1 Procedure PT Treatments $Therapeutic Activity: 8-22 mins   PT G Codes:          [name] 07/04/2014, 1:58 PM  [facility] Dumla[facility] PT [phone]

## 2014-07-04 NOTE — Progress Notes (Signed)
[place] TEAM 1 - Stepdown/ICU TEAM Progress Note  Briana Gould ZOX:[id number] DOB: 1946-04-19 DOA: 06/27/2014 PCP: [name], MD  Admit HPI / Brief Narrative: 68 year old female with multiple comorbidities including severe multivessel CAD (not candidate for percutaneous intervention), ESRD on HD, and PAD status post right BKA, who was admitted to [facility] on 06/27/2014. She was having a hemodialysis catheter placed at an out-pt surgical center when she became pulseless.  She received CPR for 2 minutes with return of spontaneous circulation.   She presented to the ER unresponsive w/ ineffective ventilation and posturing.  This was her third cardiac arrest. She was placed under the hypothermia protocol. On 06/29/2014 she was rewarmed and extubated. 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 PCCM discussed goals of care with family who still desire for FULL CODE STATUS. Cardiology and Nephrology have also assisted in her care.   HPI/Subjective: Pt is awake and interactive.  She is unable to provide a detailed hx, but does answer some simple questions.  She denies specific complaints.  She says she is "super."   Assessment/Plan:  Cardiac arrest  - Patient with history of severe multivessel CAD, found not to be candidate for PCI, having a history of cardiac arrest x 2 prior to this 3rd event  - Cardiology has followed  - no ischemic symptoms presently  - continue current medical therapy for CAD  Acute respiratory failure secondary to Healthcare associated pneumonia  - Chest x-ray 06/30/2014 showing bibasilar pneumonia versus patchy atelectasis  - Continue Ceftaz and Vancomycin with HD sessions, ABX started 10/22  - WBC now WNL  - Supportive care   Parox Atrial fibrillation w/ rapid ventricular response  - NSR at present - cont BB - not an anticoag candidate per Cardiology   End-stage renal disease on HD  -  Nephrology following   - Has new tunneled dialysis catheter in L chest - AVF infiltrated   Severe multivessel coronary artery disease  - Patient is not a candidate for percutaneous intervention or coronary artery bypass grafting  - medical management only   Encephalopathy  - Patient has begun to speak some, and will now follow very simply commands  - hypoxic brain injury versus delirium versus uremia    Severe PCM  - was receiving tube feeds, but pt removed her tube this morning - given improvement in mental status will ask SLP to determine if she is now ready for a diet    Acute functional quadriplegia  - secondary to medical conditions outlined above - appears to be slowly improving - clearly moving all extremities today - PT/OT to see    Anemia of CKD  - slight drop in Hg since admission  - no signs of active bleeding - follow CBC    Type 2 diabetes mellitus  - CBGs currently reasonably controlled - follow trend   Systolic CHF - well compensated TTE 10/20 notes EF 45-50% - volume management per HD   1/2 blood cx + coag neg staph Most c/w contaminant - is on abx at present - follow clinically   Code Status: FULL Family Communication: no family present at time of exam Disposition Plan: SDU  Consultants: Cardiology  Nephrology  PCCM  Antibiotics: Vancomycin 10/22 >  Ceftaz 10/22 >  DVT prophylaxis: SQ heparin   Objective: Blood pressure 106/38, pulse 69, temperature 98 F (36.7 C), temperature source Oral, resp. rate 18, height 5' (1.524 m), weight 79.5 kg (  175 lb 4.3 oz), SpO2 98.00%.  Intake/Output Summary (Last 24 hours) at 07/04/14 0900 Last data filed at 07/04/14 0600  Gross per 24 hour  Intake    900 ml  Output      0 ml  Net    900 ml   Exam: General: No acute respiratory distress Lungs: mild bibasilar crackles - no wheeze  Cardiovascular: Regular rate and rhythm without murmur gallop or rub normal S1 and S2 Abdomen: Nontender, nondistended, soft,  bowel sounds positive, no rebound, no ascites, no appreciable mass Extremities: No significant cyanosis, clubbing, or edema bilateral lower extremities - s/p L LE BKA Neuro:  Awake, answers simple questions, moves all 4 ext spontaneously, no clear CN deficits  Data Reviewed: Basic Metabolic Panel:  Recent Labs Lab 06/29/14 0411 06/30/14 1045 07/01/14 0445 07/02/14 0220 07/04/14 0303  NA 139 136* 139 141 144  K 5.0 4.6 5.0 5.3 5.1  CL 96 95* 96 99 100  CO2 20 25 21 20 23   GLUCOSE 147* 131* 152* 228* 155*  BUN 26* 27* 42* 74* 104*  CREATININE 4.46* 4.94* 6.39* 7.82* 8.95*  CALCIUM 9.6 9.5 9.7 9.4 9.4    Liver Function Tests:  Recent Labs Lab 06/27/14 1125  AST 49*  ALT 44*  ALKPHOS 247*  BILITOT 0.5  PROT 8.2  ALBUMIN 3.8   Coags:  Recent Labs Lab 06/27/14 1420 06/27/14 2032  INR 1.31 1.18    Recent Labs Lab 06/27/14 1420 06/27/14 2032  APTT 34 36    CBC:  Recent Labs Lab 06/27/14 1125  06/28/14 1000 06/29/14 0450 06/30/14 1045 07/01/14 0445 07/04/14 0303  WBC 7.5  --  5.5 12.8* 11.2* 9.5 8.1  NEUTROABS 5.0  --   --  11.4*  --  7.5  --   HGB 12.0  < > 10.8* 12.9 11.2* 10.1* 9.5*  HCT 36.1  < > 31.6* 37.6 33.7* 31.1* 28.9*  MCV 93.8  --  92.7 94.2 96.8 94.8 95.1  PLT 174  --  146* 209 182 165 162  < > = values in this interval not displayed.  Cardiac Enzymes:  Recent Labs Lab 06/27/14 1125  TROPONINI <0.30    CBG:  Recent Labs Lab 07/03/14 1607 07/03/14 1923 07/03/14 2307 07/04/14 0326 07/04/14 0733  GLUCAP 147* 125* 133* 151* 167*    Recent Results (from the past 240 hour(s))  MRSA PCR SCREENING     Status: None   Collection Time    06/27/14  2:18 PM      Result Value Ref Range Status   MRSA by PCR NEGATIVE  NEGATIVE Final   Comment:            The GeneXpert MRSA Assay (FDA     approved for NASAL specimens     only), is one component of a     comprehensive MRSA colonization     surveillance program. It is not      intended to diagnose MRSA     infection nor to guide or     monitor treatment for     MRSA infections.  CULTURE, BLOOD (ROUTINE X 2)     Status: None   Collection Time    06/30/14 12:20 PM      Result Value Ref Range Status   Specimen Description BLOOD RIGHT HAND   Final   Special Requests BOTTLES DRAWN AEROBIC AND ANAEROBIC 5CC   Final   Culture  Setup Time     Final  Value: 06/30/2014 17:34     Performed at [facility]   Culture     Final   Value: STAPHYLOCOCCUS SPECIES (COAGULASE NEGATIVE)     Note: THE SIGNIFICANCE OF ISOLATING THIS ORGANISM FROM A SINGLE SET OF BLOOD CULTURES WHEN MULTIPLE SETS ARE DRAWN IS UNCERTAIN. PLEASE NOTIFY THE MICROBIOLOGY DEPARTMENT WITHIN ONE WEEK IF SPECIATION AND SENSITIVITIES ARE REQUIRED.     Note: Gram Stain Report Called to,Read Back By and Verified With: TONYA@12 :16PM ON 07/01/14 BY DANTS     Performed at [facility]   Report Status 07/02/2014 FINAL   Final  CULTURE, BLOOD (ROUTINE X 2)     Status: None   Collection Time    06/30/14 12:26 PM      Result Value Ref Range Status   Specimen Description BLOOD RIGHT THUMB   Final   Special Requests BOTTLES DRAWN AEROBIC ONLY 5CC   Final   Culture  Setup Time     Final   Value: 06/30/2014 17:35     Performed at [facility]   Culture     Final   Value:        BLOOD CULTURE RECEIVED NO GROWTH TO DATE CULTURE WILL BE HELD FOR 5 DAYS BEFORE ISSUING A FINAL NEGATIVE REPORT     Performed at [facility]   Report Status PENDING   Incomplete     Studies:  Recent x-ray studies have been reviewed in detail by the Attending Physician  Scheduled Meds:  Scheduled Meds: . antiseptic oral rinse  7 mL Mouth Rinse q12n4p  . cefTAZidime (FORTAZ)  IV  2 g Intravenous Once  . chlorhexidine  15 mL Mouth Rinse BID  . darbepoetin (ARANESP) injection - DIALYSIS  25 mcg Intravenous Once  . doxercalciferol  5 mcg Intravenous Q M,W,F-HD  . doxercalciferol  5 mcg Intravenous Q  Sat-HD  . feeding supplement (PRO-STAT SUGAR FREE 64)  30 mL Oral q morning - 10a  . [START ON 07/06/2014] ferric gluconate (FERRLECIT/NULECIT) IV  62.5 mg Intravenous Q Wed-HD  . heparin  5,000 Units Dialysis Once in dialysis  . insulin aspart  0-9 Units Subcutaneous 6 times per day  . metoprolol tartrate  25 mg Oral Q6H  . pneumococcal 23 valent vaccine  0.5 mL Intramuscular Tomorrow-1000  . prednisoLONE acetate  1 drop Both Eyes TID  . vancomycin  750 mg Intravenous Once    Time spent on care of this patient: 35 mins   Erlean Mealor T , MD   [facility] Office  [phone] Pager - Text Page per Loretha StaplerAmion as per below:  On-Call/Text Page:      [email or web address]      password [email or web address]  If 7PM-7AM, please contact night-coverage [email or web address] Password [email or web address] 07/04/2014, 9:00 AM   LOS: 7 days

## 2014-07-04 NOTE — Progress Notes (Signed)
[facility] Progress Note   Subjective: awake, not very interactive  Filed Vitals:   07/04/14 0300 07/04/14 0435 07/04/14 0729 07/04/14 1127  BP: 115/41 95/34 106/38 128/53  Pulse: 71  69 71  Temp: 97.9 F (36.6 C)  98 F (36.7 C) 98.6 F (37 C)  TempSrc: Oral  Oral Oral  Resp: 17  18 18   Height:      Weight: 79.5 kg (175 lb 4.3 oz)     SpO2: 100%  98% 97%   Exam: Awake, difficulty verbalizing  No jvd  Chest mostly clear, occ scattered rales RRR 2/6 SEM no gallop  Abd obese, soft, NTND, no mass  L BKA, no LE edema L IJ TDC intact; LUA AVF +bruit , ecchymosis noted Neuro as above  HD: MWF  F180 4h 75kg 400/800 2/2.0 [place] Heparin 5000  Aranesp 25 every 2 weeks (last 10/07), Venofer 50 mg / wed, Hectorol 5 ug TIW   Assessment:  1 Cardioresp arrest - s/p cooling protocol 2 Pulm edema  - resolved 3 AMS - some improvement, slow 4 Afib - back in NSR, dilt changed to lopressor short-acting 25 qid 5 ESRD on HD MWF 6 Access - infiltrated AVF, has new TDC L IJ; should be able to use AVF soon; plan for Wednesday 7 Anemia Hb continuing aranesp 8 PNA suspected, on emp AB 9 HTN - try to keep SBP over 115-120 if possible 10 DM per prim  11 HPTH cont vit D, resume Renvela 2 ac tid when eating  12 CM EF 35% 13 Severe CAD, not surg candidate / severe ASCVD, not candidate for procedure d/t GIB on antilplatelets   Plan- Regular HD today, Watch BPs during Tx.  Sabra Heckyan Sanford, MD [phone]     Recent Labs Lab 07/01/14 [phone] 07/02/14 0220 07/04/14 0303  NA 139 141 144  K 5.0 5.3 5.1  CL 96 99 100  CO2 21 20 23   GLUCOSE 152* 228* 155*  BUN 42* 74* 104*  CREATININE 6.39* 7.82* 8.95*  CALCIUM 9.7 9.4 9.4   No results found for this basename: AST, ALT, ALKPHOS, BILITOT, PROT, ALBUMIN,  in the last 168 hours  Recent Labs Lab 06/29/14 0450 06/30/14 1045 07/01/14 0445 07/04/14 0303  WBC 12.8* 11.2* 9.5 8.1  NEUTROABS 11.4*  --  7.5  --   HGB 12.9 11.2*  10.1* 9.5*  HCT 37.6 33.7* 31.1* 28.9*  MCV 94.2 96.8 94.8 95.1  PLT 209 182 165 162   . antiseptic oral rinse  7 mL Mouth Rinse q12n4p  . aspirin  81 mg Oral Daily  . cefTAZidime (FORTAZ)  IV  2 g Intravenous Once  . chlorhexidine  15 mL Mouth Rinse BID  . darbepoetin (ARANESP) injection - DIALYSIS  25 mcg Intravenous Once  . doxercalciferol  5 mcg Intravenous Q M,W,F-HD  . doxercalciferol  5 mcg Intravenous Q Sat-HD  . feeding supplement (PRO-STAT SUGAR FREE 64)  30 mL Oral q morning - 10a  . [START ON 07/06/2014] ferric gluconate (FERRLECIT/NULECIT) IV  62.5 mg Intravenous Q Wed-HD  . fluticasone  2 puff Inhalation BID  . heparin  5,000 Units Dialysis Once in dialysis  . insulin aspart  0-9 Units Subcutaneous 6 times per day  . metoprolol tartrate  25 mg Oral Q6H  . midodrine  5 mg Oral Q M,W,F-HD  . pneumococcal 23 valent vaccine  0.5 mL Intramuscular Tomorrow-1000  . prednisoLONE acetate  1 drop Both Eyes TID  . vancomycin  750 mg Intravenous Once   . feeding supplement (NEPRO CARB STEADY) 1,000 mL (07/03/14 1819)   sodium chloride, sodium chloride, acetaminophen (TYLENOL) oral liquid 160 mg/5 mL, feeding supplement (NEPRO CARB STEADY), heparin, heparin, lidocaine (PF), lidocaine-prilocaine, pentafluoroprop-tetrafluoroeth

## 2014-07-04 NOTE — Progress Notes (Signed)
ANTIBIOTIC CONSULT NOTE - INITIAL  Pharmacy Consult for Ceftazidime + Vancomycin Indication: pneumonia  Allergies  Allergen Reactions  . Phenergan [Promethazine Hcl] Other (See Comments)    Causes patient to" vomit more"  . Vitamin D Analogs Rash    Patient Measurements: Height: 5' (152.4 cm) Weight: 175 lb 4.3 oz (79.5 kg) IBW/kg (Calculated) : 45.5  Vital Signs: Temp: 97.9 F (36.6 C) (10/26 0300) Temp Source: Oral (10/26 0300) BP: 95/34 mmHg (10/26 0435) Pulse Rate: 71 (10/26 0300) Intake/Output from previous day: 10/25 0701 - 10/26 0700 In: 970 [NG/GT:970] Out: 0  Intake/Output from this shift:    Labs:  Recent Labs  07/02/14 0220 07/04/14 0303  WBC  --  8.1  HGB  --  9.5*  PLT  --  162  CREATININE 7.82* 8.95*   Estimated Creatinine Clearance: 5.6 ml/min (by C-G formula based on Cr of 8.95).  Recent Labs  07/04/14 0303  VANCORANDOM 23.0     Microbiology: Recent Results (from the past 720 hour(s))  MRSA PCR SCREENING     Status: None   Collection Time    06/27/14  2:18 PM      Result Value Ref Range Status   MRSA by PCR NEGATIVE  NEGATIVE Final   Comment:            The GeneXpert MRSA Assay (FDA     approved for NASAL specimens     only), is one component of a     comprehensive MRSA colonization     surveillance program. It is not     intended to diagnose MRSA     infection nor to guide or     monitor treatment for     MRSA infections.  CULTURE, BLOOD (ROUTINE X 2)     Status: None   Collection Time    06/30/14 12:20 PM      Result Value Ref Range Status   Specimen Description BLOOD RIGHT HAND   Final   Special Requests BOTTLES DRAWN AEROBIC AND ANAEROBIC 5CC   Final   Culture  Setup Time     Final   Value: 06/30/2014 17:34     Performed at [facility]   Culture     Final   Value: STAPHYLOCOCCUS SPECIES (COAGULASE NEGATIVE)     Note: THE SIGNIFICANCE OF ISOLATING THIS ORGANISM FROM A SINGLE SET OF BLOOD CULTURES WHEN MULTIPLE  SETS ARE DRAWN IS UNCERTAIN. PLEASE NOTIFY THE MICROBIOLOGY DEPARTMENT WITHIN ONE WEEK IF SPECIATION AND SENSITIVITIES ARE REQUIRED.     Note: Gram Stain Report Called to,Read Back By and Verified With: TONYA@12 :16PM ON 07/01/14 BY DANTS     Performed at [facility]   Report Status 07/02/2014 FINAL   Final  CULTURE, BLOOD (ROUTINE X 2)     Status: None   Collection Time    06/30/14 12:26 PM      Result Value Ref Range Status   Specimen Description BLOOD RIGHT THUMB   Final   Special Requests BOTTLES DRAWN AEROBIC ONLY 5CC   Final   Culture  Setup Time     Final   Value: 06/30/2014 17:35     Performed at [facility]   Culture     Final   Value:        BLOOD CULTURE RECEIVED NO GROWTH TO DATE CULTURE WILL BE HELD FOR 5 DAYS BEFORE ISSUING A FINAL NEGATIVE REPORT     Performed at [facility]   Report  Status PENDING   Incomplete    Medical History: Past Medical History  Diagnosis Date  . PAD (peripheral artery disease)     a. L BKA  . Type II diabetes mellitus   . Anemia   . History of blood transfusion 2002    "related to amputation" (08/13/2013)  . End stage renal disease on dialysis     [facility]" (08/13/2013)  . DVT (deep venous thrombosis)     a. R IJ DVT 08/2013.  Marland Kitchen. CVA (cerebral infarction)     a. MRI 08/2013: possible left motor strip infarct with remote pontine infarct.  . Pulmonary HTN     a. Cath 2011 - mod to severe pulmonary hypertension. b. Echo 08/2013: PAP 45mmHg (mild-mod increased).  . Thrombocytopenia   . CAD (coronary artery disease)     a. Severe diffuse diabetic disease by cath 2011, for med rx, not a candidate for CABG or PCI at that time.  . Aortic stenosis     mild  . Syncope     a. 2011, etiology never clear.  . Carotid artery disease     a. Dopp 16/109612/2014: 1-39% LICA< 60-79% RICA borderline >80%.  . Cerebrovascular disease     a. 08/2013 MRA brain: multiple bilateral moderate and high grade  intracranial stenoses  . Cardiac arrest 08/14/13    a. Possible arrest in setting of hypotension during dialysis with possible arrest requiring brief CPR, not intubated and was fully intact after episode. Suspected due to low cerebral perfusion in setting of hypotension. Unclear if any arrhythmia.  . End stage renal disease on dialysis   . Peripheral arterial occlusive disease   . Hypertension   . Diabetes mellitus type 2, uncontrolled, with complications   . DVT (deep venous thrombosis)      Right internal jugular vein  . CAD (coronary artery disease), native coronary artery 2011    Diffuse distal vessel CAD noted at cardiac catheterization  . MI (myocardial infarction)   . Stroke     Medications:  Scheduled:  . antiseptic oral rinse  7 mL Mouth Rinse q12n4p  . chlorhexidine  15 mL Mouth Rinse BID  . darbepoetin (ARANESP) injection - DIALYSIS  25 mcg Intravenous Once  . doxercalciferol  5 mcg Intravenous Q M,W,F-HD  . doxercalciferol  5 mcg Intravenous Q Sat-HD  . feeding supplement (PRO-STAT SUGAR FREE 64)  30 mL Oral q morning - 10a  . [START ON 07/06/2014] ferric gluconate (FERRLECIT/NULECIT) IV  62.5 mg Intravenous Q Wed-HD  . heparin  5,000 Units Dialysis Once in dialysis  . insulin aspart  0-9 Units Subcutaneous 6 times per day  . metoprolol tartrate  25 mg Oral Q6H  . pneumococcal 23 valent vaccine  0.5 mL Intramuscular Tomorrow-1000  . prednisoLONE acetate  1 drop Both Eyes TID   Infusions:  . feeding supplement (NEPRO CARB STEADY) 1,000 mL (07/03/14 1819)   Assessment: 68 yo F admitted 06/27/2014 s/p arrest at out patient surgical center after placement of tunneled HD cath. Patient is ESRD on HD now to start IV abx empirically for HCAP day #4 (noted for 7 days treatment per CCM).  WBC trend down to 8.1, last HD 11/24 and plans for HD today. Vancomycin random today was 23.    Ceftaz 10/22>> Vanc 10/22>>  10/22 BCx2>>1/2 CoNS  Goal of Therapy:  Pre-HD level: 15-25  mcg/mL Post-HD level: 5-15 mcg/mL   Plan:  - Ceftazidime 2 gm IV qHD -  Vanc 750 mg IV qHD - Subsequent doses will be ordered based on HD tolerance and schedule - Monitor temp, WBC, C&S, vanc levels as indicated  Harland Germanndrew Dyer Klug, Pharm D 07/04/2014 7:12 AM

## 2014-07-05 DIAGNOSIS — E119 Type 2 diabetes mellitus without complications: Secondary | ICD-10-CM

## 2014-07-05 DIAGNOSIS — N189 Chronic kidney disease, unspecified: Secondary | ICD-10-CM

## 2014-07-05 DIAGNOSIS — I5023 Acute on chronic systolic (congestive) heart failure: Secondary | ICD-10-CM

## 2014-07-05 DIAGNOSIS — D631 Anemia in chronic kidney disease: Secondary | ICD-10-CM

## 2014-07-05 LAB — GLUCOSE, CAPILLARY
GLUCOSE-CAPILLARY: 166 mg/dL — AB (ref 70–99)
Glucose-Capillary: 102 mg/dL — ABNORMAL HIGH (ref 70–99)
Glucose-Capillary: 131 mg/dL — ABNORMAL HIGH (ref 70–99)
Glucose-Capillary: 137 mg/dL — ABNORMAL HIGH (ref 70–99)
Glucose-Capillary: 139 mg/dL — ABNORMAL HIGH (ref 70–99)
Glucose-Capillary: 88 mg/dL (ref 70–99)

## 2014-07-05 MED ORDER — DARBEPOETIN ALFA-POLYSORBATE 25 MCG/0.42ML IJ SOLN: 25.0000 ug | INTRAMUSCULAR | Status: DC

## 2014-07-05 MED ORDER — ACETAMINOPHEN 325 MG PO TABS
650.0000 mg | ORAL_TABLET | Freq: Four times a day (QID) | ORAL | Status: DC | PRN
  Administered 2014-07-05 – 2014-07-06 (×3): 650 mg via ORAL
  Filled 2014-07-05 (×3): qty 2

## 2014-07-05 MED ORDER — HEPARIN SODIUM (PORCINE) 1000 UNIT/ML DIALYSIS
5000.0000 [IU] | Freq: Once | INTRAMUSCULAR | Status: DC
  Filled 2014-07-05: qty 5

## 2014-07-05 MED ORDER — RESOURCE THICKENUP CLEAR PO POWD
ORAL | Status: DC | PRN
  Filled 2014-07-05 (×2): qty 125

## 2014-07-05 MED ORDER — DM-GUAIFENESIN ER 30-600 MG PO TB12
1.0000 | ORAL_TABLET | Freq: Two times a day (BID) | ORAL | Status: DC
  Administered 2014-07-05 – 2014-07-07 (×4): 1 via ORAL
  Filled 2014-07-05 (×5): qty 1

## 2014-07-05 NOTE — Progress Notes (Signed)
[place] TEAM 1 - Stepdown/ICU TEAM Progress Note  Saoirse Cecil CobbsSwanston Conerly GEX:[id number] DOB: 01-Jan-1946 DOA: 06/27/2014 PCP: Laurena SlimmerLARK,PRESTON S, MD  Admit HPI / Brief Narrative: 68 year old BF PMHx  severe multivessel CAD (not candidate for percutaneous intervention), ESRD on HD M/W/F and PAD status post right BKA,  Admitted to PCCM on 06/27/2014. She was having a hemodialysis catheter placed at an out-pt surgical center when she became pulseless. She received CPR for 2 minutes with return of spontaneous circulation.  She presented to the ER unresponsive w/ ineffective ventilation and posturing. This was her third cardiac arrest. She was placed under the hypothermia protocol. On 06/29/2014 she was rewarmed and extubated. 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 PCCM discussed goals of care with family who still desire for FULL CODE STATUS. Cardiology and Nephrology have also assisted in her care.     HPI/Subjective: A/O x 3 (does not know when)   Assessment/Plan: Cardiac arrest  - Patient with history of severe multivessel CAD, found not to be candidate for PCI, having a history of cardiac arrest x 2 prior to this 3rd event  - Cardiology has followed  - no ischemic symptoms presently  - continue current medical therapy for CAD   Acute respiratory failure secondary to Healthcare associated pneumonia  - Not totally convinced patient has HCAP but secondary to her multiple comorbidities would treat for 7 days.  -Chest x-ray 06/30/2014 showing bibasilar pneumonia versus patchy atelectasis  - Continue Ceftaz and Vancomycin with HD sessions, ABX started 10/22  - WBC now WNL  - Flutter valve  -Mucinex DM BID  Parox Atrial fibrillation w/ rapid ventricular response  - NSR at present - cont BB  - not an anticoag candidate per Cardiology   End-stage renal disease on ESRD on HD M/W/F - Nephrology following  - Has new tunneled  dialysis catheter in L chest - AVF infiltrated   Severe multivessel coronary artery disease  - Patient is not a candidate for percutaneous intervention or coronary artery bypass grafting  - medical management only   Encephalopathy  - Resolved most likely multifactorial to include respiratory/cardiac arrest, uremia   Severe PCM  - Speech recommends dysphagia 3; nectar thick liquid diet     Acute functional quadriplegia  - Moves all extremities to commands   - PT/OT to see   Anemia of CKD  - slight drop in Hg since admission  - no signs of active bleeding - follow CBC   Type 2 diabetes mellitus  - CBGs currently reasonably controlled - follow trend -Check hemoglobin A1c -Check lipid panel   Systolic CHF - well compensated  TTE 10/20 notes EF 45-50% - volume management per HD   1/2 blood cx + coag neg staph  Most c/w contaminant - is on abx at present - follow clinically     Code Status: FULL  Family Communication: Brother present at time of exam  Disposition Plan:   Consultants: Dr. Hillis RangeJames Allred (cardiology) Dr. Ronnell Freshwateryan Samford (nephrology)  Procedure/Significant Events: 10/19 CT head;remote lacunar infarct within the inferior midbrain.  -No acute infarct, hemorrhage, or mass lesion is present    Culture 10/19 MRSA by PCR negative 10/22 blood right hand positive coag negative staph (most likely contaminant) 10/22 right thumb NGTD   Antibiotics: Ceftaz 10/26>> Vancomycin 10/26>>    DVT prophylaxis: Heparin dialysis and SCD   Devices    LINES / TUBES:      Continuous Infusions: .  feeding supplement (NEPRO CARB STEADY) 1,000 mL (07/03/14 1819)    Objective: VITAL SIGNS: Temp: 98.7 F (37.1 C) (10/27 0800) Temp Source: Oral (10/27 0800) BP: 144/73 mmHg (10/27 1000) Pulse Rate: 66 (10/27 1000) SPO2; FIO2:   Intake/Output Summary (Last 24 hours) at 07/05/14 1205 Last data filed at 07/04/14 2204  Gross per 24 hour  Intake     50 ml    Output      0 ml  Net     50 ml     Exam: General: A/O x 4, NAD, No acute respiratory distress Lungs: Clear to auscultation bilaterally without wheezes or crackles Cardiovascular: Regular rate and rhythm Positive systolic murmur(chronic), Negative  gallop or rub normal S1 and S2 Abdomen: Nontender, nondistended, soft, bowel sounds positive, no rebound, no ascites, no appreciable mass Extremities: No significant cyanosis, clubbing, or edema bilateral lower extremities; Lt BKA NO Decub present   Data Reviewed: Basic Metabolic Panel:  Recent Labs Lab 06/29/14 0411 06/30/14 1045 07/01/14 0445 07/02/14 0220 07/04/14 0303  NA 139 136* 139 141 144  K 5.0 4.6 5.0 5.3 5.1  CL 96 95* 96 99 100  CO2 20 25 21 20 23   GLUCOSE 147* 131* 152* 228* 155*  BUN 26* 27* 42* 74* 104*  CREATININE 4.46* 4.94* 6.39* 7.82* 8.95*  CALCIUM 9.6 9.5 9.7 9.4 9.4   Liver Function Tests: No results found for this basename: AST, ALT, ALKPHOS, BILITOT, PROT, ALBUMIN,  in the last 168 hours No results found for this basename: LIPASE, AMYLASE,  in the last 168 hours No results found for this basename: AMMONIA,  in the last 168 hours CBC:  Recent Labs Lab 06/29/14 0450 06/30/14 1045 07/01/14 0445 07/04/14 0303  WBC 12.8* 11.2* 9.5 8.1  NEUTROABS 11.4*  --  7.5  --   HGB 12.9 11.2* 10.1* 9.5*  HCT 37.6 33.7* 31.1* 28.9*  MCV 94.2 96.8 94.8 95.1  PLT 209 182 165 162   Cardiac Enzymes: No results found for this basename: CKTOTAL, CKMB, CKMBINDEX, TROPONINI,  in the last 168 hours BNP (last 3 results)  Recent Labs  09/20/13 0913 09/20/13 1155  PROBNP 57457.0* 47172.0*   CBG:  Recent Labs Lab 07/04/14 1131 07/04/14 1619 07/05/14 0010 07/05/14 0346 07/05/14 0804  GLUCAP 147* 138* 88 102* 139*    Recent Results (from the past 240 hour(s))  MRSA PCR SCREENING     Status: None   Collection Time    06/27/14  2:18 PM      Result Value Ref Range Status   MRSA by PCR NEGATIVE  NEGATIVE  Final   Comment:            The GeneXpert MRSA Assay (FDA     approved for NASAL specimens     only), is one component of a     comprehensive MRSA colonization     surveillance program. It is not     intended to diagnose MRSA     infection nor to guide or     monitor treatment for     MRSA infections.  CULTURE, BLOOD (ROUTINE X 2)     Status: None   Collection Time    06/30/14 12:20 PM      Result Value Ref Range Status   Specimen Description BLOOD RIGHT HAND   Final   Special Requests BOTTLES DRAWN AEROBIC AND ANAEROBIC 5CC   Final   Culture  Setup Time     Final   Value: 06/30/2014  17:34     Performed at [facility]   Culture     Final   Value: STAPHYLOCOCCUS SPECIES (COAGULASE NEGATIVE)     Note: THE SIGNIFICANCE OF ISOLATING THIS ORGANISM FROM A SINGLE SET OF BLOOD CULTURES WHEN MULTIPLE SETS ARE DRAWN IS UNCERTAIN. PLEASE NOTIFY THE MICROBIOLOGY DEPARTMENT WITHIN ONE WEEK IF SPECIATION AND SENSITIVITIES ARE REQUIRED.     Note: Gram Stain Report Called to,Read Back By and Verified With: TONYA@12 :16PM ON 07/01/14 BY DANTS     Performed at [facility]   Report Status 07/02/2014 FINAL   Final  CULTURE, BLOOD (ROUTINE X 2)     Status: None   Collection Time    06/30/14 12:26 PM      Result Value Ref Range Status   Specimen Description BLOOD RIGHT THUMB   Final   Special Requests BOTTLES DRAWN AEROBIC ONLY 5CC   Final   Culture  Setup Time     Final   Value: 06/30/2014 17:35     Performed at [facility]   Culture     Final   Value:        BLOOD CULTURE RECEIVED NO GROWTH TO DATE CULTURE WILL BE HELD FOR 5 DAYS BEFORE ISSUING A FINAL NEGATIVE REPORT     Performed at [facility]   Report Status PENDING   Incomplete     Studies:  Recent x-ray studies have been reviewed in detail by the Attending Physician  Scheduled Meds:  Scheduled Meds: . antiseptic oral rinse  7 mL Mouth Rinse q12n4p  . aspirin  81 mg Oral Daily  . chlorhexidine  15  mL Mouth Rinse BID  . [START ON 07/11/2014] darbepoetin (ARANESP) injection - DIALYSIS  25 mcg Intravenous Q Mon-HD  . doxercalciferol  5 mcg Intravenous Q M,W,F-HD  . doxercalciferol  5 mcg Intravenous Q Sat-HD  . feeding supplement (PRO-STAT SUGAR FREE 64)  30 mL Oral q morning - 10a  . [START ON 07/06/2014] ferric gluconate (FERRLECIT/NULECIT) IV  62.5 mg Intravenous Q Wed-HD  . fluticasone  2 puff Inhalation BID  . heparin  5,000 Units Dialysis Once in dialysis  . insulin aspart  0-9 Units Subcutaneous 6 times per day  . metoprolol  5 mg Intravenous 4 times per day  . midodrine  5 mg Oral Q M,W,F-HD  . pneumococcal 23 valent vaccine  0.5 mL Intramuscular Tomorrow-1000  . prednisoLONE acetate  1 drop Both Eyes TID    Time spent on care of this patient: 40 mins   Drema DallasWOODS, CURTIS, J , MD   [facility] Office  [phone] Pager [phone]- [phone]  On-Call/Text Page:      [email or web address]      password [email or web address]  If 7PM-7AM, please contact night-coverage [email or web address] Password [email or web address] 07/05/2014, 12:05 PM   LOS: 8 days

## 2014-07-05 NOTE — Evaluation (Signed)
Clinical/Bedside Swallow Evaluation Patient Details  Name: Briana Gould MRN: [id number] Date of Birth: 07/25/1946  Today's Date: 07/05/2014 Time: [id number] SLP Time Calculation (min): 32 min  Past Medical History:  Past Medical History  Diagnosis Date  . PAD (peripheral artery disease)     a. L BKA  . Type II diabetes mellitus   . Anemia   . History of blood transfusion 2002    "related to amputation" (08/13/2013)  . End stage renal disease on dialysis     [facility] W, F" (08/13/2013)  . DVT (deep venous thrombosis)     a. R IJ DVT 08/2013.  Marland Kitchen. CVA (cerebral infarction)     a. MRI 08/2013: possible left motor strip infarct with remote pontine infarct.  . Pulmonary HTN     a. Cath 2011 - mod to severe pulmonary hypertension. b. Echo 08/2013: PAP 45mmHg (mild-mod increased).  . Thrombocytopenia   . CAD (coronary artery disease)     a. Severe diffuse diabetic disease by cath 2011, for med rx, not a candidate for CABG or PCI at that time.  . Aortic stenosis     mild  . Syncope     a. 2011, etiology never clear.  . Carotid artery disease     a. Dopp 29/528412/2014: 1-39% LICA< 60-79% RICA borderline >80%.  . Cerebrovascular disease     a. 08/2013 MRA brain: multiple bilateral moderate and high grade intracranial stenoses  . Cardiac arrest 08/14/13    a. Possible arrest in setting of hypotension during dialysis with possible arrest requiring brief CPR, not intubated and was fully intact after episode. Suspected due to low cerebral perfusion in setting of hypotension. Unclear if any arrhythmia.  . End stage renal disease on dialysis   . Peripheral arterial occlusive disease   . Hypertension   . Diabetes mellitus type 2, uncontrolled, with complications   . DVT (deep venous thrombosis)      Right internal jugular vein  . CAD (coronary artery disease), native coronary artery 2011    Diffuse distal vessel CAD noted at cardiac catheterization  . MI (myocardial  infarction)   . Stroke    Past Surgical History:  Past Surgical History  Procedure Laterality Date  . Av fistula placement Left 2011    upper arm  . Below knee leg amputation Left 2002  . Tonsillectomy    . Cataract extraction w/ intraocular lens  implant, bilateral Bilateral 2012-2014  . Esophagogastroduodenoscopy N/A 08/30/2013    Procedure: ESOPHAGOGASTRODUODENOSCOPY (EGD);  Surgeon: Theda BelfastPatrick D Hung, MD;  [facility];  Service: Endoscopy;  Laterality: N/A;  . Below knee leg amputation Left   . Av fistula placement Left    HPI:  Pt admitted with cardiac arrest, acute respiratory failure. PMH: cardiac arrests, CAD, ESRD, PAD with s/p BKA. 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 Per daughter, she transitioned to thin liquids while she was in the hospital and has not had any episodes of PNA until this admission.   Assessment / Plan / Recommendation Clinical Impression  Pt demonstrates immediate and delayed throat clearing with thin liquid boluses, concerning for airway penetration. No overt signs of aspiration are observed with nectar thick liquids and solid textures, although pt does require additional time for bolus preparation and frequent respositioning to maintain safe, upright posture. Suspect that this is an acute, reversible  dysphagia secondary to decreased functional reserve with acute admission. Recommend to initiate Dys 3 textures and nectar thick liquids with SLP f/u for tolerance and advancement.    Aspiration Risk  Moderate    Diet Recommendation Dysphagia 3 (Mechanical Soft);Nectar-thick liquid   Liquid Administration via: Cup;Straw Medication Administration: Whole meds with puree Supervision: Staff to assist with self feeding;Full supervision/cueing for compensatory strategies Compensations: Slow rate;Small sips/bites Postural  Changes and/or Swallow Maneuvers: Seated upright 90 degrees    Other  Recommendations Oral Care Recommendations: Oral care BID Other Recommendations: Order thickener from pharmacy;Prohibited food (jello, ice cream, thin soups);Remove water pitcher   Follow Up Recommendations  Skilled Nursing facility    Frequency and Duration min 2x/week  2 weeks   Pertinent Vitals/Pain n/a    SLP Swallow Goals     Swallow Study Prior Functional Status       General Date of Onset: 06/27/14 HPI: Pt admitted with cardiac arrest, acute respiratory failure. PMH: cardiac arrests, CAD, ESRD, PAD with s/p BKA. 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 Per daughter, she transitioned to thin liquids while she was in the hospital and has not had any episodes of PNA until this admission. Type of Study: Bedside swallow evaluation Previous Swallow Assessment: see HPI Diet Prior to this Study: NPO Temperature Spikes Noted: No Respiratory Status: Room air History of Recent Intubation: Yes Length of Intubations (days): 3 days Date extubated: 06/29/14 Behavior/Cognition: Alert;Cooperative;Pleasant mood;Requires cueing;Decreased sustained attention Oral Cavity - Dentition: Dentures, top;Dentures, bottom Self-Feeding Abilities: Needs assist Patient Positioning: Upright in bed (pt requires frequent repositioning to remain upright in bed) Baseline Vocal Quality: Clear    Oral/Motor/Sensory Function     Ice Chips Ice chips: Not tested   Thin Liquid Thin Liquid: Impaired Presentation: Cup;Self Fed;Straw Pharyngeal  Phase Impairments: Suspected delayed Swallow;Throat Clearing - Immediate;Throat Clearing - Delayed    Nectar Thick Nectar Thick Liquid: Impaired Presentation: Cup;Self Fed;Straw Pharyngeal Phase Impairments: Suspected delayed Swallow   Honey Thick Honey Thick Liquid:  Not tested   Puree Puree: Impaired Presentation: Spoon;Self Fed Oral Phase Impairments: Reduced lingual movement/coordination;Impaired anterior to posterior transit Oral Phase Functional Implications: Prolonged oral transit   Solid   GO    Solid: Impaired Oral Phase Impairments: Reduced lingual movement/coordination;Impaired anterior to posterior transit;Impaired mastication Oral Phase Functional Implications: Other (comment) (prolonged oral transit)      Maxcine HamLaura Paiewonsky, M.A. CCC-SLP [phone]  Maxcine Hamaiewonsky, Anyjah Roundtree 07/05/2014,11:56 AM

## 2014-07-05 NOTE — Progress Notes (Signed)
Admit: 06/27/2014 LOS: 8  Briana Gould admitted w/ arrest at vascular access center (third arrest total) in setting of severe ASCVD, chronic SHF, PVD s/p AKA.  S/p Cooling.  Has HCAP on Vanc/Ceftaz.  Persistent AMS  Subjective:  HD yesetrday, no UF  BP ok More awake, alert, and interactive Family at bedside  10/26 0701 - 10/27 0700 In: 50 [IV Piggyback:50] Out: 0   Filed Weights   07/04/14 0300 07/04/14 1806 07/05/14 0500  Weight: 79.5 kg (175 lb 4.3 oz) 74 kg (163 lb 2.3 oz) 74.9 kg (165 lb 2 oz)    Scheduled Meds: . antiseptic oral rinse  7 mL Mouth Rinse q12n4p  . aspirin  81 mg Oral Daily  . chlorhexidine  15 mL Mouth Rinse BID  . doxercalciferol  5 mcg Intravenous Q M,W,F-HD  . doxercalciferol  5 mcg Intravenous Q Sat-HD  . feeding supplement (PRO-STAT SUGAR FREE 64)  30 mL Oral q morning - 10a  . [START ON 07/06/2014] ferric gluconate (FERRLECIT/NULECIT) IV  62.5 mg Intravenous Q Wed-HD  . fluticasone  2 puff Inhalation BID  . heparin  5,000 Units Dialysis Once in dialysis  . insulin aspart  0-9 Units Subcutaneous 6 times per day  . metoprolol  5 mg Intravenous 4 times per day  . midodrine  5 mg Oral Q M,W,F-HD  . pneumococcal 23 valent vaccine  0.5 mL Intramuscular Tomorrow-1000  . prednisoLONE acetate  1 drop Both Eyes TID   Continuous Infusions: . feeding supplement (NEPRO CARB STEADY) 1,000 mL (07/03/14 1819)   PRN Meds:.sodium chloride, sodium chloride, acetaminophen (TYLENOL) oral liquid 160 mg/5 mL, feeding supplement (NEPRO CARB STEADY), heparin, heparin, lidocaine (PF), lidocaine-prilocaine, pentafluoroprop-tetrafluoroeth  Current Labs: reviewed   Physical Exam:  Blood pressure 144/73, pulse 66, temperature 98.7 F (37.1 C), temperature source Oral, resp. rate 15, height 5' (1.524 m), weight 74.9 kg (165 lb 2 oz), SpO2 95.00%. RRR NAD CTAB LUA AVF +B/T, venous side bruit diminished; not swollend, old ecchymoses No LEE AAOx3  HD: MWF  F180 4h  75kg 400/800 2/2.0 [place] Heparin 5000  Aranesp 25 every 2 weeks (last 10/07), Venofer 50 mg / wed, Hectorol 5 ug TIW  Assessment/Plan 1. Cardioresp Arrest: s/p cooling.  Resolved 2. [facility]; Keep SBP > 130 3. Vascular Access: Qb at 300 last Tx.  AVF had infiltrated. Try to cannulate 17g tomorrow. 4. HTN/Vol: Keep SPB >130 mmHg at HD.  EDW 75kg as outpt, likely needs some lowering; Challeng 0.5kg tomorrow 5. 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  Renveal 2qAC when eating.  Ca WNL 6. Anemia of CKD: Maintenance Fe qWed.  Place on qWk Aranesp w/ acute illness, Hb 9.5 7. HCAP: per TRH; Vanc/Ceftaz 8. AMS: slow to resolve 9. DM2 10. CAD, not candidate for intervention   Sabra Heckyan Jj Enyeart MD 07/05/2014, 10:56 AM   Recent Labs Lab 07/01/14 0445 07/02/14 0220 07/04/14 0303  NA 139 141 144  K 5.0 5.3 5.1  CL 96 99 100  CO2 21 20 23   GLUCOSE 152* 228* 155*  BUN 42* 74* 104*  CREATININE 6.39* 7.82* 8.95*  CALCIUM 9.7 9.4 9.4    Recent Labs Lab 06/29/14 0450 06/30/14 1045 07/01/14 0445 07/04/14 0303  WBC 12.8* 11.2* 9.5 8.1  NEUTROABS 11.4*  --  7.5  --   HGB 12.9 11.2* 10.1* 9.5*  HCT 37.6 33.7* 31.1* 28.9*  MCV 94.2 96.8 94.8 95.1  PLT 209 182 165 162

## 2014-07-06 LAB — COMPREHENSIVE METABOLIC PANEL
ALK PHOS: 159 U/L — AB (ref 39–117)
ALT: 19 U/L (ref 0–35)
AST: 17 U/L (ref 0–37)
Albumin: 2.5 g/dL — ABNORMAL LOW (ref 3.5–5.2)
Anion gap: 20 — ABNORMAL HIGH (ref 5–15)
BILIRUBIN TOTAL: 0.4 mg/dL (ref 0.3–1.2)
BUN: 79 mg/dL — ABNORMAL HIGH (ref 6–23)
CHLORIDE: 97 meq/L (ref 96–112)
CO2: 23 meq/L (ref 19–32)
CREATININE: 7.93 mg/dL — AB (ref 0.50–1.10)
Calcium: 9.4 mg/dL (ref 8.4–10.5)
GFR calc Af Amer: 5 mL/min — ABNORMAL LOW (ref >90)
GFR, EST NON AFRICAN AMERICAN: 5 mL/min — AB (ref >90)
Glucose, Bld: 172 mg/dL — ABNORMAL HIGH (ref 70–99)
Potassium: 4.9 mEq/L (ref 3.7–5.3)
SODIUM: 140 meq/L (ref 137–147)
Total Protein: 6.7 g/dL (ref 6.0–8.3)

## 2014-07-06 LAB — GLUCOSE, CAPILLARY
GLUCOSE-CAPILLARY: 109 mg/dL — AB (ref 70–99)
GLUCOSE-CAPILLARY: 125 mg/dL — AB (ref 70–99)
Glucose-Capillary: 134 mg/dL — ABNORMAL HIGH (ref 70–99)
Glucose-Capillary: 151 mg/dL — ABNORMAL HIGH (ref 70–99)
Glucose-Capillary: 161 mg/dL — ABNORMAL HIGH (ref 70–99)
Glucose-Capillary: 77 mg/dL (ref 70–99)

## 2014-07-06 LAB — CULTURE, BLOOD (ROUTINE X 2): CULTURE: NO GROWTH

## 2014-07-06 LAB — CBC WITH DIFFERENTIAL/PLATELET
BASOS PCT: 0 % (ref 0–1)
Basophils Absolute: 0 10*3/uL (ref 0.0–0.1)
EOS ABS: 0.1 10*3/uL (ref 0.0–0.7)
Eosinophils Relative: 1 % (ref 0–5)
HEMATOCRIT: 29.1 % — AB (ref 36.0–46.0)
HEMOGLOBIN: 9.6 g/dL — AB (ref 12.0–15.0)
Lymphocytes Relative: 15 % (ref 12–46)
Lymphs Abs: 1.4 10*3/uL (ref 0.7–4.0)
MCH: 31.4 pg (ref 26.0–34.0)
MCHC: 33 g/dL (ref 30.0–36.0)
MCV: 95.1 fL (ref 78.0–100.0)
Monocytes Absolute: 0.8 10*3/uL (ref 0.1–1.0)
Monocytes Relative: 9 % (ref 3–12)
NEUTROS ABS: 6.7 10*3/uL (ref 1.7–7.7)
Neutrophils Relative %: 75 % (ref 43–77)
Platelets: 223 10*3/uL (ref 150–400)
RBC: 3.06 MIL/uL — ABNORMAL LOW (ref 3.87–5.11)
RDW: 14.9 % (ref 11.5–15.5)
WBC: 9 10*3/uL (ref 4.0–10.5)

## 2014-07-06 LAB — LIPID PANEL
Cholesterol: 147 mg/dL (ref 0–200)
HDL: 33 mg/dL — ABNORMAL LOW (ref >39)
LDL CALC: 91 mg/dL (ref 0–99)
TRIGLYCERIDES: 114 mg/dL (ref <150)
Total CHOL/HDL Ratio: 4.5 RATIO
VLDL: 23 mg/dL (ref 0–40)

## 2014-07-06 LAB — HEMOGLOBIN A1C
HEMOGLOBIN A1C: 6.3 % — AB (ref <5.7)
MEAN PLASMA GLUCOSE: 134 mg/dL — AB (ref <117)

## 2014-07-06 LAB — MAGNESIUM: Magnesium: 2.4 mg/dL (ref 1.5–2.5)

## 2014-07-06 MED ORDER — DEXTROSE 5 % IV SOLN
2.0000 g | INTRAVENOUS | Status: DC
  Administered 2014-07-06: 2 g via INTRAVENOUS
  Filled 2014-07-06: qty 2

## 2014-07-06 MED ORDER — DOXERCALCIFEROL 4 MCG/2ML IV SOLN
INTRAVENOUS | Status: AC
  Filled 2014-07-06: qty 4

## 2014-07-06 MED ORDER — VANCOMYCIN HCL IN DEXTROSE 750-5 MG/150ML-% IV SOLN
750.0000 mg | INTRAVENOUS | Status: DC
  Administered 2014-07-06: 750 mg via INTRAVENOUS
  Filled 2014-07-06: qty 150

## 2014-07-06 MED ORDER — NEPRO/CARBSTEADY PO LIQD
237.0000 mL | Freq: Two times a day (BID) | ORAL | Status: DC
  Administered 2014-07-07 (×2): 237 mL via ORAL

## 2014-07-06 MED ORDER — PRO-STAT SUGAR FREE PO LIQD
30.0000 mL | Freq: Two times a day (BID) | ORAL | Status: DC
  Administered 2014-07-06 – 2014-07-07 (×2): 30 mL via ORAL
  Filled 2014-07-06 (×2): qty 30

## 2014-07-06 NOTE — Progress Notes (Signed)
NUTRITION FOLLOW UP  Intervention:   Provide Nepro Shake po BID thickened to nectar thick consistency, each supplement provides 425 kcal and 19 grams protein  Increase 30 ml Prostat po to BID, each supplement provides 100 kcal and 15 grams of protein.  Encourage PO intake.  Nutrition Dx:   Inadequate oral intake related to inability to eat as evidenced by NPO, just advanced to dysphagia 3 diet with nectar thick liquids; ongoing  Goal:   Pt to meet >/= 90% of their estimated nutrition needs; not met  Monitor:   Diet advancement, PO intake, labs, weight changes, I/O's  Assessment:   Briana Gould is an 68 y.o. female s/p cardiopulmonary arrest after catheter placement. Patient placed under hypothermia protocol (10/19-10/20) and now extubated (10/21.)  10/26-Chart reviewed. Pt pulled out NGT this AM. Pt is currently awaiting SLP consult to assess readiness for PO diet.  Noted a 8.9% wt gain x 3 days. Wt gain likely due to fluid retention. Pt with ESRD on HD.    10/28- Pt has been advanced to a dysphagia 3 diet with nectar thick liquids. Meal completion is 0-40%. Pt reports her appetite is just "ok". Pt is agreeable to Nepro Shake. Will increase prostat to maximize protein needs. Pt was encouraged to eat her food at her meals and to take her oral supplements.   Labs: High BUN, creatinine, and alkaline phosphatase.  Height: Ht Readings from Last 1 Encounters:  06/27/14 5' (1.524 m)    Weight Status:   Wt Readings from Last 1 Encounters:  07/06/14 174 lb 9.7 oz (79.2 kg)  07/01/14  161 lb 9.6 oz (73.3 kg)   Re-estimated needs:  Kcal: 1900-2100 Protein: 78-88 grams Fluid: 1-1.5 L  Skin: skin tears on lt abdomen and rt leg, generalized edema  Diet Order: Dysphagia 3 with nectar thick liquids   Intake/Output Summary (Last 24 hours) at 07/06/14 1009 Last data filed at 07/05/14 2000  Gross per 24 hour  Intake    220 ml  Output      0 ml  Net    220 ml    Last BM:  10/26   Labs:   Recent Labs Lab 07/01/14 0445 07/02/14 0220 07/04/14 0303  NA 139 141 144  K 5.0 5.3 5.1  CL 96 99 100  CO2 _0 BUN 42* 74* 104*  CREATININE 6.39* 7.82* 8.95*  CALCIUM 9.7 9.4 9.4  GLUCOSE 152* 228* 155*    CBG (last 3)   Recent Labs  07/06/14 0008 07/06/14 0429 07/06/14 0808  GLUCAP 151* 109* 125*    Scheduled Meds: . antiseptic oral rinse  7 mL Mouth Rinse q12n4p  . aspirin  81 mg Oral Daily  . chlorhexidine  15 mL Mouth Rinse BID  . [START ON 07/11/2014] darbepoetin (ARANESP) injection - DIALYSIS  25 mcg Intravenous Q Mon-HD  . dextromethorphan-guaiFENesin  1 tablet Oral BID  . doxercalciferol  5 mcg Intravenous Q M,W,F-HD  . doxercalciferol  5 mcg Intravenous Q Sat-HD  . feeding supplement (PRO-STAT SUGAR FREE 64)  30 mL Oral q morning - 10a  . ferric gluconate (FERRLECIT/NULECIT) IV  62.5 mg Intravenous Q Wed-HD  . fluticasone  2 puff Inhalation BID  . heparin  5,000 Units Dialysis Once in dialysis  . heparin  5,000 Units Dialysis Once in dialysis  . insulin aspart  0-9 Units Subcutaneous 6 times per day  . metoprolol  5 mg Intravenous 4 times per day  . midodrine  5 mg Oral Q M,W,F-HD  . pneumococcal 23 valent vaccine  0.5 mL Intramuscular Tomorrow-1000  . prednisoLONE acetate  1 drop Both Eyes TID    Continuous Infusions: . feeding supplement (NEPRO CARB STEADY) 1,000 mL (07/03/14 1819)    Kallie Locks, MS, RD, LDN Pager # [phone] After hours/ weekend pager # [phone]

## 2014-07-06 NOTE — Clinical Social Work Placement (Addendum)
Clinical Social Work Department CLINICAL SOCIAL WORK PLACEMENT NOTE 07/06/2014  Patient:  Briana Gould,Briana Gould  Account Number:  [id number] Admit date:  06/27/2014  Clinical Social Worker:  Genelle BalVANESSA Ceili Boshers, LCSW  Date/time:  07/06/2014 12:58 PM  Clinical Social Work is seeking post-discharge placement for this patient at the following level of care:   SKILLED NURSING   (*CSW will update this form in Epic as items are completed)     Patient/family provided with [facility] Department of Clinical Social Work's list of facilities offering this level of care within the geographic area requested by the patient (or if unable, by the patient's family).  07/06/2014  Patient/family informed of their freedom to choose among providers that offer the needed level of care, that participate in Medicare, Medicaid or managed care program needed by the patient, have an available bed and are willing to accept the patient.    Patient/family informed of [facility]' ownership interest in [facility], as well as of the fact that they are under no obligation to receive care at this facility.  PASARR submitted to EDS on [id number] PASARR number received on [id number]  FL2 transmitted to all facilities in geographic area requested by pt/family on  07/06/2014 FL2 transmitted to all facilities within larger geographic area on   Patient informed that his/her managed care company has contracts with or will negotiate with  certain facilities, including the following:     Patient/family informed of bed offers received:   [facility] Physician recommends and patient chooses bed at    [facility] to be transferred to [facility] on 06/28/14  Patient to be transferred to facility by stretcher  Patient and family notified of transfer on 07/07/14 Name of family member notified: Annabelle Harmanana and Juanda BondZackery Brooks, daughter and son-in-law   The following physician request  were entered in Epic:  Additional Comments: 07/07/14: [facility] suggested during today's length of stay meeting. [facility] contacted and after evaluating patient, informed nurse case manager that they can accept her today. Patient's daughter notified by phone by nurse case manager C. Royal and son-in-law contacted by CSW.

## 2014-07-06 NOTE — Progress Notes (Addendum)
[facility] Progress Note  Briana Cecil CobbsSwanston Vrba ZOX:[id number] DOB: 12/03/1945 DOA: 06/27/2014 PCP: Laurena SlimmerLARK,PRESTON S, MD  Admit HPI / Brief Narrative: 68 year old female with multiple comorbidities including severe multivessel CAD (not candidate for percutaneous intervention), ESRD on HD, and PAD status post right BKA, [facility]. She was having a hemodialysis catheter placed at an out-pt surgical center when she became pulseless.  She received CPR for 2 minutes with return of spontaneous circulation.   She presented to the ER unresponsive w/ ineffective ventilation and posturing.  This was her third cardiac arrest. She was placed under the hypothermia protocol. On 06/29/2014 she was rewarmed and extubated. 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 PCCM discussed goals of care with family who still desire for FULL CODE STATUS. Cardiology and Nephrology have also assisted in her care.   HPI/Subjective: Pt is awake and interactive.  , Able to answer some simple questions.  She denies specific complaints.   Assessment/Plan:  Cardiac arrest  - Patient with history of severe multivessel CAD, found not to be candidate for PCI, having a history of cardiac arrest x 2 prior to this 3rd event  - Cardiology has followed  - no ischemic symptoms presently  - continue current medical therapy for CAD  Acute respiratory failure secondary to Healthcare associated pneumonia  - Chest x-ray 06/30/2014 showing bibasilar pneumonia versus patchy atelectasis  - Continue Ceftaz and Vancomycin with HD sessions, ABX started 10/22  - WBC now WNL  - Supportive care   Parox Atrial fibrillation w/ rapid ventricular response  - NSR at present - cont BB - not an anticoag candidate per Cardiology   End-stage renal disease on HD  - Nephrology following   - Has new tunneled dialysis catheter in L chest - AVF infiltrated    Severe multivessel coronary artery disease  - Patient is not a candidate for percutaneous intervention or coronary artery bypass grafting  - medical management only   Encephalopathy  - Patient has begun to speak some, and will now follow commands  - hypoxic brain injury versus delirium versus uremia    Severe PCM  -Management per Nutritional management  Acute functional quadriplegia  - secondary to medical conditions outlined above - appears to be  improving - clearly moving all extremities today - PT/OT to see    Anemia of CKD  - slight drop in Hg since admission  - no signs of active bleeding - follow CBC    Type 2 diabetes mellitus  - CBGs currently reasonably controlled - follow trend   Systolic CHF - well compensated TTE 10/20 notes EF 45-50% - volume management per HD   1/2 blood cx + coag neg staph Most c/w contaminant - is on abx at present - follow clinically   Code Status: FULL Family Communication: no family present at time of exam Disposition Plan: SNF when patient is able to tolerate transfer from dialysis.  Consultants: Cardiology  Nephrology  PCCM  Antibiotics: Vancomycin 10/22 >  Ceftaz 10/22 >  DVT prophylaxis: SQ heparin   Objective: Blood pressure 134/67, pulse 65, temperature 98.5 F (36.9 C), temperature source Oral, resp. rate 18, height 5' (1.524 m), weight 74.6 kg (164 lb 7.4 oz), SpO2 99.00%.  Intake/Output Summary (Last 24 hours) at 07/06/14 1512 Last data filed at 07/06/14 1000  Gross per 24 hour  Intake    100 ml  Output  0 ml  Net    100 ml   Exam: General: No acute respiratory distress Lungs: mild bibasilar crackles - no wheeze  Cardiovascular: Regular rate and rhythm without murmur gallop or rub normal S1 and S2 Abdomen: Nontender, nondistended, soft, bowel sounds positive, no rebound, no ascites, no appreciable mass Extremities: No significant cyanosis, clubbing, or edema bilateral lower extremities - s/p L LE  BKA Neuro:  Awake, answers simple questions, moves all 4 ext spontaneously, no clear CN deficits  Data Reviewed: Basic Metabolic Panel:  Recent Labs Lab 06/30/14 1045 07/01/14 0445 07/02/14 0220 07/04/14 0303 07/06/14 1300  NA 136* 139 141 144 140  K 4.6 5.0 5.3 5.1 4.9  CL 95* 96 99 100 97  CO2 25 21 20 23 23   GLUCOSE 131* 152* 228* 155* 172*  BUN 27* 42* 74* 104* 79*  CREATININE 4.94* 6.39* 7.82* 8.95* 7.93*  CALCIUM 9.5 9.7 9.4 9.4 9.4  MG  --   --   --   --  2.4    Liver Function Tests:  Recent Labs Lab 07/06/14 1300  AST 17  ALT 19  ALKPHOS 159*  BILITOT 0.4  PROT 6.7  ALBUMIN 2.5*   Coags: No results found for this basename: PT, INR,  in the last 168 hours No results found for this basename: APTT,  in the last 168 hours  CBC:  Recent Labs Lab 06/30/14 1045 07/01/14 0445 07/04/14 0303 07/06/14 1300  WBC 11.2* 9.5 8.1 9.0  NEUTROABS  --  7.5  --  6.7  HGB 11.2* 10.1* 9.5* 9.6*  HCT 33.7* 31.1* 28.9* 29.1*  MCV 96.8 94.8 95.1 95.1  PLT 182 165 162 223    Cardiac Enzymes: No results found for this basename: CKTOTAL, CKMB, CKMBINDEX, TROPONINI,  in the last 168 hours  CBG:  Recent Labs Lab 07/05/14 2021 07/06/14 0008 07/06/14 0429 07/06/14 0808 07/06/14 1142  GLUCAP 137* 151* 109* 125* 161*    Recent Results (from the past 240 hour(s))  MRSA PCR SCREENING     Status: Gould   Collection Time    06/27/14  2:18 PM      Result Value Ref Range Status   MRSA by PCR NEGATIVE  NEGATIVE Final   Comment:            The GeneXpert MRSA Assay (FDA     approved for NASAL specimens     only), is one component of a     comprehensive MRSA colonization     surveillance program. It is not     intended to diagnose MRSA     infection nor to guide or     monitor treatment for     MRSA infections.  CULTURE, BLOOD (ROUTINE X 2)     Status: Gould   Collection Time    06/30/14 12:20 PM      Result Value Ref Range Status   Specimen Description BLOOD  RIGHT HAND   Final   Special Requests BOTTLES DRAWN AEROBIC AND ANAEROBIC 5CC   Final   Culture  Setup Time     Final   Value: 06/30/2014 17:34     Performed at [facility]   Culture     Final   Value: STAPHYLOCOCCUS SPECIES (COAGULASE NEGATIVE)     Note: THE SIGNIFICANCE OF ISOLATING THIS ORGANISM FROM A SINGLE SET OF BLOOD CULTURES WHEN MULTIPLE SETS ARE DRAWN IS UNCERTAIN. PLEASE NOTIFY THE MICROBIOLOGY DEPARTMENT WITHIN ONE WEEK IF SPECIATION AND SENSITIVITIES ARE  REQUIRED.     Note: Gram Stain Report Called to,Read Back By and Verified With: TONYA@12 :16PM ON 07/01/14 BY DANTS     [facility] at [facility]   Report Status 07/02/2014 FINAL   Final  CULTURE, BLOOD (ROUTINE X 2)     Status: Gould   Collection Time    06/30/14 12:26 PM      Result Value Ref Range Status   Specimen Description BLOOD RIGHT THUMB   Final   Special Requests BOTTLES DRAWN AEROBIC ONLY 5CC   Final   Culture  Setup Time     Final   Value: 06/30/2014 17:35     Performed at [facility]   Culture     Final   Value: NO GROWTH 5 DAYS     Performed at [facility]   Report Status 07/06/2014 FINAL   Final     Studies:  Recent x-ray studies have been reviewed in detail by the Attending Physician  Scheduled Meds:  Scheduled Meds: . antiseptic oral rinse  7 mL Mouth Rinse q12n4p  . aspirin  81 mg Oral Daily  . cefTAZidime (FORTAZ)  IV  2 g Intravenous Q M,W,F-2000  . chlorhexidine  15 mL Mouth Rinse BID  . [START ON 07/11/2014] darbepoetin (ARANESP) injection - DIALYSIS  25 mcg Intravenous Q Mon-HD  . dextromethorphan-guaiFENesin  1 tablet Oral BID  . doxercalciferol  5 mcg Intravenous Q M,W,F-HD  . doxercalciferol  5 mcg Intravenous Q Sat-HD  . feeding supplement (NEPRO CARB STEADY)  237 mL Oral BID BM  . feeding supplement (PRO-STAT SUGAR FREE 64)  30 mL Oral BID  . ferric gluconate (FERRLECIT/NULECIT) IV  62.5 mg Intravenous Q Wed-HD  . fluticasone  2 puff Inhalation BID   . heparin  5,000 Units Dialysis Once in dialysis  . insulin aspart  0-9 Units Subcutaneous 6 times per day  . metoprolol  5 mg Intravenous 4 times per day  . midodrine  5 mg Oral Q M,W,F-HD  . pneumococcal 23 valent vaccine  0.5 mL Intramuscular Tomorrow-1000  . prednisoLONE acetate  1 drop Both Eyes TID  . vancomycin  750 mg Intravenous Q M,W,F-HD    Time spent on care of this patient: 35 mins   Elara Cocke , MD   [facility] Pager [phone]  If 7PM-7AM, please contact night-coverage [email or web address] Password [email or web address] 07/06/2014, 3:12 PM   LOS: 9 days

## 2014-07-06 NOTE — Procedures (Signed)
I was present at this dialysis session. I have reviewed the session itself and made appropriate changes.   [facility] to [facility] today.  [facility]   Discussed access with Paulene FloorJames Lin.  Has direct compression of AVF by recent hematoma.  Needs to rest for 2-3 wk. [facility].  SBP in 140s. UF goal 500mL.  Pt doing well.  Sabra Heckyan Di Jasmer  MD 07/06/2014, 3:16 PM

## 2014-07-06 NOTE — Progress Notes (Signed)
Speech Language Pathology Treatment: Dysphagia  Patient Details Name: Briana Gould MRN: [id number] DOB: 1946-03-21 Today's Date: 07/06/2014 Time: [id number] SLP Time Calculation (min): 23 min  Assessment / Plan / Recommendation Clinical Impression  Pt continues to demonstrate signs of penetration/aspiration with thin liquids, including wet vocal quality. Pt has difficulty returning voice to baseline with cued throat clear, however several cup sips culminate in strong, delayed episode of reflexive coughing. With Min cues provided by SLP for small cup sips and assist with repositioning for safe intake, pt did not show overt signs of aspiration with nectar thick liquids. Recommend to continue with Dys 3 textures and nectar thick liquids at this time.   HPI HPI: Pt admitted with cardiac arrest, acute respiratory failure. PMH: cardiac arrests, CAD, ESRD, PAD with s/p BKA. 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 Per daughter, she transitioned to thin liquids while she was in the hospital and has not had any episodes of PNA until this admission.   Pertinent Vitals Pain Assessment: No/denies pain  SLP Plan  Continue with current plan of care    Recommendations Diet recommendations: Dysphagia 3 (mechanical soft);Nectar-thick liquid Medication Administration: Whole meds with puree Supervision: Full supervision/cueing for compensatory strategies;Patient able to self feed Compensations: Slow rate;Small sips/bites Postural Changes and/or Swallow Maneuvers: Seated upright 90 degrees              Oral Care Recommendations: Oral care BID Follow up Recommendations: Skilled Nursing facility Plan: Continue with current plan of care    GO      Briana Gould, Briana Gould [phone]  Briana Gould, Briana Gould 07/06/2014, 11:34 AM

## 2014-07-06 NOTE — Progress Notes (Signed)
New Admission Note:   Arrival Method:  Via bed Mental Orientation: A&Ox2 Telemetry: st Assessment: Completed Skin:excoriation saccrum IV: nsl Pain: none Tubes: none Safety Measures: Safety Fall Prevention Plan has been given, discussed and signed Admission: Completed [address] Orientation: Patient has been orientated to the room, unit and staff.  Family: none at bedside  Orders have been reviewed and implemented. Will continue to monitor the patient. Call light has been placed within reach and bed alarm has been activated.   De Nursey Karcyn Menn BSN, Publishing copyN  Phone number: [phone]

## 2014-07-06 NOTE — Progress Notes (Signed)
Patient evaluated for community based chronic disease management services with [facility] as a benefit of patient's [employer]. Spoke with inpatient team RN and SW regarding patient's disposition.  Patient will be transferring to a SNF for short term rehabilitation.  It is not clear if she will be able to returned to home at this time.  Patient is currently in dialysis.  [facility] will not engage.  Left contact information and [facility] literature at bedside. Made Inpatient Case Manager aware that [facility] Management following. Of note, [facility] services does not replace or interfere with any services that are arranged by inpatient case management or social work.  For additional questions or referrals please contact Anibal Hendersonim Henderson BSN RN [facility] Liaison at [phone].

## 2014-07-06 NOTE — Clinical Social Work Psychosocial (Signed)
Clinical Social Work Department BRIEF PSYCHOSOCIAL ASSESSMENT 07/06/2014  Patient:  Briana Gould,Briana Gould     Account Number:  [id number]     Admit date:  06/27/2014  Clinical Social Worker:  Delmer IslamRAWFORD,Jatoria Kneeland, LCSW  Date/Time:  07/06/2014 12:49 PM  Referred by:  Physician  Date Referred:  07/04/2014 Referred for  SNF Placement   Other Referral:   Interview type:  Family Other interview type:    PSYCHOSOCIAL DATA Living Status:  FAMILY Admitted from facility:   Level of care:   Primary support name:  Madelyn BrunnerDana Brooks Primary support relationship to patient:  CHILD, ADULT Degree of support available:   In the home with patient is her brother and 3 adopted children. Patient also has a housekeeper during the day.    CURRENT CONCERNS Current Concerns  Post-Acute Placement   Other Concerns:    SOCIAL WORK ASSESSMENT / PLAN CSW received call from Mr. Juanda BondZackery Brooks, son-in-law regarding discharge plan for patient. His wife [name] (patient's daughter) is requesting St rehab for patient before going home. CSW encouraged to talk with patient, however per Mr. Shon BatonBrooks, she is a little confused as of yesterday (Tuesday, 10/27). Patient's daughter is to be contacted with facility responses.   Assessment/plan status:  Psychosocial Support/Ongoing Assessment of Needs Other assessment/ plan:   Information/referral to community resources:   None needed or requested at this time.    PATIENT'S/FAMILY'S RESPONSE TO PLAN OF CARE: Family requesting short-term rehab for patient.

## 2014-07-07 ENCOUNTER — Inpatient Hospital Stay
Admission: AD | Admit: 2014-07-07 | Discharge: 2014-07-29 | Disposition: A | Discharge status: Rehabilitation Facility | Payer: Self-pay | Source: Ambulatory Visit | Attending: Internal Medicine | Admitting: Internal Medicine

## 2014-07-07 DIAGNOSIS — I350 Nonrheumatic aortic (valve) stenosis: Secondary | ICD-10-CM

## 2014-07-07 DIAGNOSIS — J189 Pneumonia, unspecified organism: Secondary | ICD-10-CM

## 2014-07-07 DIAGNOSIS — R0602 Shortness of breath: Secondary | ICD-10-CM

## 2014-07-07 DIAGNOSIS — N186 End stage renal disease: Secondary | ICD-10-CM

## 2014-07-07 DIAGNOSIS — R52 Pain, unspecified: Secondary | ICD-10-CM

## 2014-07-07 DIAGNOSIS — R079 Chest pain, unspecified: Secondary | ICD-10-CM

## 2014-07-07 DIAGNOSIS — L988 Other specified disorders of the skin and subcutaneous tissue: Secondary | ICD-10-CM

## 2014-07-07 DIAGNOSIS — N289 Disorder of kidney and ureter, unspecified: Secondary | ICD-10-CM

## 2014-07-07 LAB — GLUCOSE, CAPILLARY
GLUCOSE-CAPILLARY: 149 mg/dL — AB (ref 70–99)
Glucose-Capillary: 121 mg/dL — ABNORMAL HIGH (ref 70–99)
Glucose-Capillary: 141 mg/dL — ABNORMAL HIGH (ref 70–99)
Glucose-Capillary: 146 mg/dL — ABNORMAL HIGH (ref 70–99)
Glucose-Capillary: 176 mg/dL — ABNORMAL HIGH (ref 70–99)

## 2014-07-07 LAB — BASIC METABOLIC PANEL
Anion gap: 19 — ABNORMAL HIGH (ref 5–15)
BUN: 33 mg/dL — AB (ref 6–23)
CHLORIDE: 98 meq/L (ref 96–112)
CO2: 22 mEq/L (ref 19–32)
Calcium: 9.2 mg/dL (ref 8.4–10.5)
Creatinine, Ser: 4.23 mg/dL — ABNORMAL HIGH (ref 0.50–1.10)
GFR calc Af Amer: 11 mL/min — ABNORMAL LOW (ref >90)
GFR calc non Af Amer: 10 mL/min — ABNORMAL LOW (ref >90)
GLUCOSE: 181 mg/dL — AB (ref 70–99)
POTASSIUM: 4.7 meq/L (ref 3.7–5.3)
Sodium: 139 mEq/L (ref 137–147)

## 2014-07-07 LAB — HEMOGLOBIN A1C
HEMOGLOBIN A1C: 6.3 % — AB (ref <5.7)
MEAN PLASMA GLUCOSE: 134 mg/dL — AB (ref <117)

## 2014-07-07 LAB — MAGNESIUM: Magnesium: 2.1 mg/dL (ref 1.5–2.5)

## 2014-07-07 MED ORDER — RENA-VITE PO TABS: 1.0000 | ORAL_TABLET | Freq: Every day | ORAL | Status: DC

## 2014-07-07 MED ORDER — METOPROLOL TARTRATE 12.5 MG HALF TABLET
12.5000 mg | ORAL_TABLET | Freq: Two times a day (BID) | ORAL | Status: DC
  Filled 2014-07-07 (×3): qty 1

## 2014-07-07 MED ORDER — PRO-STAT SUGAR FREE PO LIQD: 30.0000 mL | Freq: Two times a day (BID) | ORAL | Status: AC

## 2014-07-07 MED ORDER — ACETAMINOPHEN 325 MG PO TABS: 650.0000 mg | ORAL_TABLET | Freq: Four times a day (QID) | ORAL | Status: DC | PRN

## 2014-07-07 MED ORDER — METOPROLOL TARTRATE 1 MG/ML IV SOLN: 2.5000 mg | Freq: Four times a day (QID) | INTRAVENOUS | Status: DC

## 2014-07-07 MED ORDER — METOPROLOL TARTRATE 1 MG/ML IV SOLN
2.5000 mg | Freq: Four times a day (QID) | INTRAVENOUS | Status: DC
  Administered 2014-07-07: 2.5 mg via INTRAVENOUS
  Filled 2014-07-07 (×3): qty 5

## 2014-07-07 MED ORDER — INSULIN ASPART 100 UNIT/ML ~~LOC~~ SOLN
0.0000 [IU] | Freq: Three times a day (TID) | SUBCUTANEOUS | Status: DC
  Administered 2014-07-07: 1 [IU] via SUBCUTANEOUS

## 2014-07-07 MED ORDER — ASPIRIN 81 MG PO CHEW: 81.0000 mg | CHEWABLE_TABLET | Freq: Every day | ORAL | Status: DC

## 2014-07-07 MED ORDER — INSULIN ASPART 100 UNIT/ML ~~LOC~~ SOLN: 0.0000 [IU] | Freq: Every day | SUBCUTANEOUS | Status: DC

## 2014-07-07 MED ORDER — METOPROLOL TARTRATE 12.5 MG HALF TABLET: 12.5000 mg | ORAL_TABLET | Freq: Two times a day (BID) | ORAL | Status: DC

## 2014-07-07 MED ORDER — SEVELAMER CARBONATE 800 MG PO TABS
1600.0000 mg | ORAL_TABLET | Freq: Three times a day (TID) | ORAL | Status: DC
  Administered 2014-07-07 (×2): 1600 mg via ORAL
  Filled 2014-07-07 (×3): qty 2

## 2014-07-07 NOTE — Progress Notes (Signed)
[name]  female  FAO:[id number]    DOB: [birth year]    DOA: 06/27/2014  PCP: Laurena SlimmerLARK,PRESTON S, MD  Brief history of present illness  68 year old female with multiple comorbidities including severe multivessel CAD (not candidate for percutaneous intervention), ESRD on HD, and PAD status post right BKA, who [facility]. She was having a hemodialysis catheter placed at an out-pt surgical center when she became pulseless. She received CPR for 2 minutes with return of spontaneous circulation.  She presented to the ER unresponsive w/ ineffective ventilation and posturing. This was her third cardiac arrest. She was placed under the hypothermia protocol. On 06/29/2014 she was rewarmed and extubated. 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 PCCM discussed goals of care with family who still desire for FULL CODE STATUS. Cardiology and Nephrology have also assisted in her care.    Assessment/Plan: Principal Problem: Cardiac arrest  - Patient with history of severe multivessel CAD, found not to be candidate for PCI, having a history of cardiac arrest x 2 prior to this 3rd event. Patient was seen by cardiology, Dr. Johney FrameAllred, has severe CAD, coronary anatomy does not have revascularization options   - continue current medical therapy for CAD  - Patient noted to be in bigeminy overnight, bradycardia and atrial fibrillation, I requested cardiology to see the patient due to change in rhythms overnight.   Active problems Acute respiratory failure secondary to Healthcare associated pneumonia  - Chest x-ray 06/30/2014 showing bibasilar pneumonia versus patchy atelectasis  - Continue Ceftaz and Vancomycin with HD sessions, ABX started 10/22  - Supportive care   Parox Atrial fibrillation w/ rapid ventricular response  - In atrial fibrillation, not an anticoag candidate per Cardiology   End-stage renal  disease on HD  - Nephrology following  - Has new tunneled dialysis catheter in L chest - AVF infiltrated  - Dr. Marisue HumbleSanford discussed with patient's Dr. 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   Severe PCM  -Management per Nutritional management   Acute functional quadriplegia  - secondary to medical conditions outlined above -PT evaluation  Anemia of CKD- currently stable   Type 2 diabetes mellitus  - CBGs currently reasonably controlled  Systolic CHF - well compensated  TTE 10/20 notes EF 45-50% - volume management per HD   1/2 blood cx + coag neg staph  Most c/w contaminant - is on abx at present - follow clinically   DVT Prophylaxis:  Code Status: Full code   Family Communication: Discussed with daughter at the bedside   Disposition: SNF vs LTAC  Consultants:  Nephrology  Cardiology  PC CM  Procedures:  Hemodialysis  Antibiotics: Vancomycin 10/22 >  Ceftaz 10/22 >    Subjective: Patient seen and examined, overnight events noted, daughter at the bedside, mental status improving  Objective: Weight change: -4.6 kg (-10 lb 2.3 oz)  Intake/Output Summary (Last 24 hours) at 07/07/14 1221 Last data filed at 07/06/14 1725  Gross per 24 hour  Intake      0 ml  Output  250 ml  Net   -250 ml   Blood pressure 129/69, pulse 85, temperature 98.6 F (37 C), temperature source Oral, resp. rate 22, height 5' (1.524 m), weight 77.6 kg (171 lb 1.2 oz), SpO2 99.00%.  Physical Exam: General: Alert and awake, oriented , not in any acute distress. CVS: S1-S2 clear, no  murmur rubs or gallops Chest: clear to auscultation bilaterally, no wheezing, rales or rhonchi Abdomen: soft nontender, nondistended, normal bowel sounds  Extremities: no cyanosis, clubbing or edema noted bilaterally   Lab Results: Basic Metabolic Panel:  Recent Labs Lab 07/06/14 1300 07/07/14 0630  NA 140 139  K 4.9 4.7  CL 97 98  CO2 23 22  GLUCOSE 172* 181*  BUN 79* 33*  CREATININE 7.93* 4.23*  CALCIUM 9.4 9.2  MG 2.4 2.1   Liver Function Tests:  Recent Labs Lab 07/06/14 1300  AST 17  ALT 19  ALKPHOS 159*  BILITOT 0.4  PROT 6.7  ALBUMIN 2.5*   No results found for this basename: LIPASE, AMYLASE,  in the last 168 hours No results found for this basename: AMMONIA,  in the last 168 hours CBC:  Recent Labs Lab 07/04/14 0303 07/06/14 1300  WBC 8.1 9.0  NEUTROABS  --  6.7  HGB 9.5* 9.6*  HCT 28.9* 29.1*  MCV 95.1 95.1  PLT 162 223   Cardiac Enzymes: No results found for this basename: CKTOTAL, CKMB, CKMBINDEX, TROPONINI,  in the last 168 hours BNP: No components found with this basename: POCBNP,  CBG:  Recent Labs Lab 07/06/14 2021 07/06/14 2351 07/07/14 0422 07/07/14 0804 07/07/14 1156  GLUCAP 134* 121* 146* 176* 141*     Micro Results: Recent Results (from the past 240 hour(s))  MRSA PCR SCREENING     Status: None   Collection Time    06/27/14  2:18 PM      Result Value Ref Range Status   MRSA by PCR NEGATIVE  NEGATIVE Final   Comment:            The GeneXpert MRSA Assay (FDA     approved for NASAL specimens     only), is one component of a     comprehensive MRSA colonization     surveillance program. It is not     intended to diagnose MRSA     infection nor to guide or     monitor treatment for     MRSA infections.  CULTURE, BLOOD (ROUTINE X 2)     Status: None   Collection Time    06/30/14 12:20 PM      Result Value Ref Range Status   Specimen Description BLOOD RIGHT HAND   Final   Special Requests BOTTLES DRAWN AEROBIC AND  ANAEROBIC 5CC   Final   Culture  Setup Time     Final   Value: 06/30/2014 17:34     Performed at [facility]   Culture     Final   Value: STAPHYLOCOCCUS SPECIES (COAGULASE NEGATIVE)     Note: THE SIGNIFICANCE OF ISOLATING THIS ORGANISM FROM A SINGLE SET OF BLOOD CULTURES WHEN MULTIPLE SETS ARE DRAWN IS UNCERTAIN. PLEASE NOTIFY THE MICROBIOLOGY DEPARTMENT WITHIN ONE WEEK IF SPECIATION AND SENSITIVITIES ARE REQUIRED.     Note: Gram Stain Report Called to,Read Back By and Verified With: TONYA@12 :16PM ON 07/01/14 BY DANTS     Performed at [facility]   Report Status 07/02/2014 FINAL   Final  CULTURE, BLOOD (ROUTINE X 2)  Status: None   Collection Time    06/30/14 12:26 PM      Result Value Ref Range Status   Specimen Description BLOOD RIGHT THUMB   Final   Special Requests BOTTLES DRAWN AEROBIC ONLY 5CC   Final   Culture  Setup Time     Final   Value: 06/30/2014 17:35     Performed at [facility]   Culture     Final   Value: NO GROWTH 5 DAYS     Performed at [facility]   Report Status 07/06/2014 FINAL   Final    Studies/Results: Dg Abd 1 View  06/30/2014   CLINICAL DATA:  35 -year-old female with feeding tube placement. Initial encounter.  EXAM: ABDOMEN - 1 VIEW  COMPARISON:  Portable chest radiograph from 0816 hr today.  FINDINGS: Portable AP supine view at 1937 hrs. Feeding tube tip projects over the body of the stomach. This is about 12 cm proximal to the level of the pylorus. Negative bowel gas pattern. Aortoiliac calcified atherosclerosis noted. Left femoral approach central venous catheter, tip projects at the left lower sacrum. No acute osseous abnormality identified.  IMPRESSION: Feeding tube tip at the body of the stomach, advanced 12 cm to allow adequate slack for post pyloric transit.   Electronically Signed   By: Augusto Gamble M.D.   On: 06/30/2014 20:10   Ct Head Wo Contrast  06/30/2014   CLINICAL DATA:  68 year old with acute  encephalopathy and altered mental status.  EXAM: CT HEAD WITHOUT CONTRAST  TECHNIQUE: Contiguous axial images were obtained from the base of the skull through the vertex without contrast.  COMPARISON:  06/27/2014  FINDINGS: No evidence for acute hemorrhage, mass lesion, midline shift, hydrocephalus or large infarct. Subtle white matter changes in the right frontal lobe are again noted. Small amount of fluid in the left mastoid air cells. Patient has developed fluid in the right sphenoid sinus. No acute bone abnormality.  IMPRESSION: No acute intracranial abnormality.  New fluid in the right sphenoid sinus. Again noted is a small amount of fluid in the left mastoid air cells.   Electronically Signed   By: Richarda Overlie M.D.   On: 06/30/2014 13:39   Ct Head Wo Contrast  06/27/2014   CLINICAL DATA:  Cardiac arrest following dialysis catheter placement. Hypertension.  EXAM: CT HEAD WITHOUT CONTRAST  TECHNIQUE: Contiguous axial images were obtained from the base of the skull through the vertex without intravenous contrast.  COMPARISON:  CT head without contrast 12/15/2013.  FINDINGS: Mild generalized atrophy and white matter disease is stable compared to prior exam. There is a remote lacunar infarct within the inferior midbrain. No acute infarct, hemorrhage, or mass lesion is present. The ventricles are of normal size. No significant extraaxial fluid collection is present.  The paranasal sinuses and mastoid air cells are clear. The osseous skull is intact. Atherosclerotic calcifications are noted.  IMPRESSION: 1. No acute intracranial abnormality or significant interval change. 2. Stable atrophy and white matter disease.   Electronically Signed   By: Gennette Pac M.D.   On: 06/27/2014 12:20   Dg Chest Port 1 View  07/01/2014   CLINICAL DATA:  Follow-up of pulmonary edema; history of coronary artery disease, aortic stenosis, pulmonary hypertension, and end-stage dialysis dependent renal failure.  EXAM: PORTABLE  CHEST - 1 VIEW  COMPARISON:  Portable chest x-ray of June 30, 2014  FINDINGS: The lungs arebetter inflated today. The interstitial markings remain increased but become less  prominent. The central pulmonary vascularity is less engorged. The cardiac silhouette remains enlarged. There is no significant pleural effusion. The dialysis catheter tip projects over the distal portion of the SVC. The feeding tube tip projects below the inferior margin of the image.  IMPRESSION: Mild interval improvement in the appearance of the pulmonary interstitium consistent with improving CHF.   Electronically Signed   By: David  [place]   On: 07/01/2014 08:49   Dg Chest Port 1 View  06/30/2014   CLINICAL DATA:  Follow-up of pulmonary edema; history of dialysis dependent end-stage renal disease, pulmonary hypertension, coronary artery disease, and diabetes  EXAM: PORTABLE CHEST - 1 VIEW  COMPARISON:  Portable chest x-ray of June 29, 2014  FINDINGS: The patient has undergone interval extubation of the trachea and esophagus. The lungs remain hypoinflated. There is an infiltrate in the left lower lobe posteriorly with persistent partial obscuration of the left hemidiaphragm. The cardiac silhouette is enlarged. The central pulmonary vascularity is engorged and the pulmonary interstitial markings are mildly increased. The dialysis catheter tip overlies the distal portion of the SVC.  IMPRESSION: Findings of worsening pulmonary interstitial edema. There is also left lower lobe pneumonia and a small left pleural effusion. The appearance of the chest has deteriorated since yesterday's study.   Electronically Signed   By: David  [place]   On: 06/30/2014 08:41   Dg Chest Port 1 View  06/29/2014   CLINICAL DATA:  Acute respiratory failure. History of cardiac arrest.  EXAM: PORTABLE CHEST - 1 VIEW  COMPARISON:  06/28/2014  FINDINGS: Endotracheal tube is 1.7 cm above the carina. Nasogastric tube extends into the abdomen. There is a left  jugular dialysis catheter. Catheter tip in the right atrium. Improved aeration and decreased edema compared to the previous examination. Cardiac pad overlying the left side of the chest. Stable consolidation at the left lung base most compatible with atelectasis but cannot exclude pleural fluid. Atherosclerotic calcifications at the aortic arch. Heart size remains enlarged.  IMPRESSION: Decreased pulmonary edema.  Persistent opacification at the left lung base as described.  Support apparatuses as described.   Electronically Signed   By: Richarda Overlie M.D.   On: 06/29/2014 07:45   Dg Chest Port 1 View  06/28/2014   CLINICAL DATA:  Intubation, coronary artery disease post MI and CABG, personal history of type 2 diabetes, end-stage renal disease on dialysis, stroke, pulmonary hypertension, hypertension  EXAM: PORTABLE CHEST - 1 VIEW  COMPARISON:  Portable exam 0453 hr compared to 06/27/2014  FINDINGS: Tip of endotracheal tube projects 3.3 cm above carina.  Nasogastric tube extends into stomach.  LEFT jugular large-bore dual-lumen central venous catheter tip projects over inferior RIGHT atrium, unchanged.  Enlargement of cardiac silhouette with pulmonary vascular congestion.  Perihilar infiltrates likely edema.  Rotated to the LEFT.  No gross pleural effusion or pneumothorax.  EKG leads and epicardial pacing leads project over chest.  IMPRESSION: Probable mild pulmonary edema.   Electronically Signed   By: Ulyses Southward M.D.   On: 06/28/2014 07:50   Dg Chest Port 1 View  06/27/2014   CLINICAL DATA:  Cardiac arrest. Repositioning of the endotracheal tube.  EXAM: PORTABLE CHEST - 1 VIEW  COMPARISON:  One-view chest from the same day.  FINDINGS: The heart is enlarged. Mild edema is stable. The endotracheal tube has been pulled back and now terminates 3.7 cm above the carina, in satisfactory position. The NG tube courses off the inferior border the film. A left IJ dialysis  catheter is stable, terminating in the right  atrium. There is no pneumothorax or significant effusion.  IMPRESSION: 1. Interval repositioning of the endotracheal tube, now terminating 3.7 cm above the carina. 2. Stable cardiomegaly and mild edema.   Electronically Signed   By: Gennette Pac M.D.   On: 06/27/2014 14:06   Dg Chest Portable 1 View  06/27/2014   CLINICAL DATA:  Cardiac arrest.  Endotracheal tube placement.  EXAM: PORTABLE CHEST - 1 VIEW  COMPARISON:  One-view chest x-ray 12/15/2013  FINDINGS: The heart is enlarged. The lung volumes are low. The endotracheal tube extends into the right mainstem bronchus, cm be on the carina. A left IJ dialysis catheter is in place. There is no pneumothorax. The NG tube courses off the inferior border of the film. Mild interstitial edema is present.  IMPRESSION: 1. The endotracheal tube terminates within the right mainstem bronchus. 2. Cardiomegaly and mild edema. 3. New left IJ catheter without radiographic evidence for complication.   Electronically Signed   By: Gennette Pac M.D.   On: 06/27/2014 12:16   Dg Chest Port 1v Same Day  07/02/2014   CLINICAL DATA:  Pulmonary edema.  Diabetes.  Dialysis patient.  EXAM: PORTABLE CHEST - 1 VIEW SAME DAY  COMPARISON:  07/01/2014.  FINDINGS: Defibrillator pad projects over left chest. The patient is rotated and leaning to the right. Lung volumes are low. Cardiomegaly appears stable. There is diffuse pulmonary vascular congestion. Bilateral interstitial prominence is again noted. No significant change in aeration of the lungs compared to 07/01/2014. Right basilar atelectasis noted. No visible pleural effusion or pneumothorax.  Left IJ dialysis catheter appears unchanged in position.  IMPRESSION: No significant change M moderate-to-marked cardiomegaly and mild diffuse interstitial pulmonary edema.   Electronically Signed   By: Britta Mccreedy M.D.   On: 07/02/2014 17:39   Dg Abd Portable 1v  06/30/2014   CLINICAL DATA:  Feeding tube placement.  EXAM: PORTABLE  ABDOMEN - 1 VIEW  COMPARISON:  Chest and abdomen radiographs obtained earlier today.  FINDINGS: Feeding tube tip in the distal stomach in the region of the gastric pylorus. Paucity of intestinal gas with no dilated bowel loops seen. Probable enlarged liver. Left jugular catheter tip in the inferior right atrium. Enlarged cardiac silhouette. No significant change in bibasilar airspace opacity  IMPRESSION: 1. Feeding tube tip in the distal stomach. 2. Probable hepatomegaly. 3. Cardiomegaly and bibasilar pneumonia or patchy atelectasis.   Electronically Signed   By: Gordan Payment M.D.   On: 06/30/2014 21:37    Medications: Scheduled Meds: . antiseptic oral rinse  7 mL Mouth Rinse q12n4p  . aspirin  81 mg Oral Daily  . cefTAZidime (FORTAZ)  IV  2 g Intravenous Q M,W,F-2000  . chlorhexidine  15 mL Mouth Rinse BID  . [START ON 07/11/2014] darbepoetin (ARANESP) injection - DIALYSIS  25 mcg Intravenous Q Mon-HD  . dextromethorphan-guaiFENesin  1 tablet Oral BID  . doxercalciferol  5 mcg Intravenous Q M,W,F-HD  . feeding supplement (NEPRO CARB STEADY)  237 mL Oral BID BM  . feeding supplement (PRO-STAT SUGAR FREE 64)  30 mL Oral BID  . ferric gluconate (FERRLECIT/NULECIT) IV  62.5 mg Intravenous Q Wed-HD  . fluticasone  2 puff Inhalation BID  . heparin  5,000 Units Dialysis Once in dialysis  . insulin aspart  0-9 Units Subcutaneous 6 times per day  . metoprolol  5 mg Intravenous 4 times per day  . midodrine  5 mg Oral Q M,W,F-HD  .  multivitamin  1 tablet Oral QHS  . pneumococcal 23 valent vaccine  0.5 mL Intramuscular Tomorrow-1000  . prednisoLONE acetate  1 drop Both Eyes TID  . sevelamer carbonate  1,600 mg Oral TID WC  . vancomycin  750 mg Intravenous Q M,W,F-HD      LOS: 10 days   RAI,RIPUDEEP M.D. [facility] 07/07/2014, 12:21 PM Pager: [phone]  If 7PM-7AM, please contact night-coverage [email or web address] Password [email or web address]

## 2014-07-07 NOTE — Progress Notes (Signed)
RN paged secondary to pt having change in rhythm on tele. Was in bigeminy, then brady with PVCs while sleeping. Scheduled Metoprolol was held at that time due to brady.  After which, pt was in Afib. 12 lead showed Afib with RVR. Metoprolol was given as per prior order and rate in the 100s at this time. Asked RN to get vitals at 7 and txt page attending.

## 2014-07-07 NOTE — Progress Notes (Signed)
OT Cancellation Note  Patient Details Name: Briana Gould MRN: [id number] DOB: 10-18-1945   Cancelled Treatment:     Patient has discharge orders [facility] this date.   Yamna Mackel A 07/07/2014, 2:50 PM

## 2014-07-07 NOTE — Care Management Note (Signed)
CARE MANAGEMENT NOTE 07/07/2014  Patient:  Briana Gould,Briana Gould   Account Number:  [id number]  Date Initiated:  06/30/2014  Documentation initiated by:  Junius CreamerWELL,DEBBIE  Subjective/Objective Assessment:   adm w cardiac arrest, esrd hd     Action/Plan:   lives w fam  07/07/2014 Discussed possible placement of this pt in LTAC, with pt and family and they are in agreement. [facility] has offered a bed and we await d/c and transfer.   Anticipated DC Date:  07/07/2014   Anticipated DC Plan:  LONG TERM ACUTE CARE (LTAC)         Choice offered to / List presented to:             Status of service:   Medicare Important Message given?  YES (If response is "NO", the following Medicare IM given date fields will be blank) Date Medicare IM given:  06/30/2014 Medicare IM given by:  Junius CreamerWELL,DEBBIE Date Additional Medicare IM given:  07/04/2014 Additional Medicare IM given by:  [facility]  Discharge Disposition:    Per UR Regulation:  Reviewed for med. necessity/level of care/duration of stay  If discussed at Long Length of Stay Meetings, dates discussed:   07/05/2014    Comments:  07/07/2014 Pt to d/c to [facility] for LTAC services.   CRoyal RN MPH, case manager, [phone]

## 2014-07-07 NOTE — Discharge Summary (Addendum)
Physician Discharge Summary  [name] MRN: [id number] DOB/AGE: 68-02-1946 68 y.o.  Admit date: 06/27/2014 Discharge date: 07/07/2014  Primary Care Physician:  [name], MD  Discharge Diagnoses:    . Cardiac arrest . Physical deconditioning . Encephalopathy acute . Hypertension . Acute respiratory failure with hypoxia . Paroxysmal a-fib . Diabetes mellitus with end stage renal disease . CAD- severe 3V CAD at cath March 2015- medical Rx  Consults:  Nephrology, Dr Marisue Humble                    Cardiology                     PCCM   Recommendations for Outpatient Follow-up:  F/u with Dr Juel Burrow regarding AVF   DIET: DYSPHAGIA 3, NECTAR THICK LIQUIDS, MEDS WHOLE IN PUREE. FULL SUPERVISION.  Next Hemodialysis tomorrow 10/30, MWF   Allergies:   Allergies  Allergen Reactions  . Phenergan [Promethazine Hcl] Other (See Comments)    Causes patient to" vomit more"  . Vitamin D Analogs Rash     Discharge Medications:   Medication List    STOP taking these medications       levofloxacin 250 MG tablet  Commonly known as:  LEVAQUIN      TAKE these medications       acetaminophen 325 MG tablet  Commonly known as:  TYLENOL  Take 2 tablets (650 mg total) by mouth every 6 (six) hours as needed for moderate pain.     albuterol (2.5 MG/3ML) 0.083% nebulizer solution  Commonly known as:  PROVENTIL  Take 3 mLs (2.5 mg total) by nebulization every 4 (four) hours as needed for wheezing.     aspirin 81 MG chewable tablet  Chew 1 tablet (81 mg total) by mouth daily.     multivitamin Tabs tablet  Take 1 tablet by mouth at bedtime.     b complex-vitamin c-folic acid 0.8 MG Tabs tablet  Take 1 tablet by mouth daily.     dextromethorphan 30 MG/5ML liquid  Commonly known as:  DELSYM  Take 5 mLs (30 mg total) by mouth 2 (two) times daily.     famotidine 20 MG tablet  Commonly known as:  PEPCID  Take 1 tablet (20 mg total) by mouth at bedtime.      feeding supplement (NEPRO CARB STEADY) Liqd  Take 237 mLs by mouth daily.     feeding supplement (PRO-STAT SUGAR FREE 64) Liqd  Take 30 mLs by mouth 2 (two) times daily at 10 AM and 5 PM.     fluticasone 110 MCG/ACT inhaler  Commonly known as:  FLOVENT HFA  Inhale 2 puffs into the lungs 2 (two) times daily.     glimepiride 1 MG tablet  Commonly known as:  AMARYL  Take 1 tablet (1 mg total) by mouth daily with breakfast.     guaiFENesin 600 MG 12 hr tablet  Commonly known as:  MUCINEX  Take 1 tablet (600 mg total) by mouth 2 (two) times daily.     metoprolol 1 MG/ML injection  Commonly known as:  LOPRESSOR  Inject 2.5 mLs (2.5 mg total) into the vein every 6 (six) hours.     midodrine 5 MG tablet  Commonly known as:  PROAMATINE  Take 1 tablet (5 mg total) by mouth every Monday, Wednesday, and Friday with hemodialysis.     pantoprazole 40 MG tablet  Commonly known as:  PROTONIX  Take 1  tablet (40 mg total) by mouth 2 (two) times daily.     prednisoLONE acetate 1 % ophthalmic suspension  Commonly known as:  PRED FORTE  Place 1 drop into both eyes 3 (three) times daily.     sevelamer carbonate 800 MG tablet  Commonly known as:  RENVELA  Take 1,600 mg by mouth 3 (three) times daily with meals.         Brief H and P: For complete details please refer to admission H and P, but in brief 68 year old female admitted on 10/19 s/p cardiopulmonary arrest after placement of tunneled HD cath in out-pt surgical center. Pt had hematoma involving her left AV fistula making HD access difficult. No HD since 10/14. Per surgical center pt not on monitor. Noted to not be breathing ~ 5 minutes s/p procedure. Oral airway placed. Assisted ventilation initiated. No pulses noted. Got CPR x 2 minutes then ROSC noted. Presented to the ER unresponsive w/ ineffective ventilation and posturing. PCCM asked to admit.    Hospital Course:  68 year old female with multiple comorbidities including severe  multivessel CAD (not candidate for percutaneous intervention), ESRD on HD, and PAD status post right BKA, who was admitted to [facility] on 06/27/2014. She was having a hemodialysis catheter placed at an out-pt surgical center when she became pulseless. She received CPR for 2 minutes with return of spontaneous circulation.  She presented to the ER unresponsive w/ ineffective ventilation and posturing. This was her third cardiac arrest. She was placed under the hypothermia protocol. On 06/29/2014 she was rewarmed and extubated. 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 PCCM discussed goals of care with family who still desire for FULL CODE STATUS. Cardiology and Nephrology have also assisted in her care.   Cardiac arrest  - Patient with history of severe multivessel CAD, found not to be candidate for PCI, having a history of cardiac arrest x 2 prior to this 3rd event. Patient was seen by cardiology, Dr. Johney Frame, has severe CAD, coronary anatomy does not have revascularization options.  - continue current medical therapy for CAD  - Discontinued IV metoprolol, placed on oral BB, titrate dose, I also requested cardiology to assess for any final recommendations.  Acute respiratory failure secondary to Healthcare associated pneumonia  - Chest x-ray 06/30/2014 showing bibasilar pneumonia versus patchy atelectasis  - Patient received Ceftaz and Vancomycin with HD sessions, completed course today. - Supportive care   Parox Atrial fibrillation w/ rapid ventricular response  - In atrial fibrillationrate currently controlled, not an anticoagulation candidate per Cardiology   End-stage renal disease on HD  - Patient was followed closely by nephrology.  - Has new tunneled dialysis catheter in L chest - AVF was infiltrated  - Dr. Marisue Humble discussed with patient's nephrologist Dr. 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   Severe PCM  -Management per Nutritional management   Acute functional quadriplegia  - secondary to medical conditions outlined above, continue physical therapy  Anemia of CKD- currently stable   Type 2 diabetes mellitus  - CBGs currently reasonably  controlled, continue SSI   Systolic CHF - well compensated  TTE 10/20 notes EF 45-50% - volume management per HD   1/2 blood cx + coag neg staph  Most c/w contaminant, afebrile    Day of Discharge BP 129/69  Pulse 85  Temp(Src) 98.6 F (37 C) (Oral)  Resp 22  Ht 5' (1.524 m)  Wt 77.6 kg (171 lb 1.2 oz)  BMI 33.41 kg/m2  SpO2 99%  Physical Exam: General: Alert and awake oriented x3 not in any acute distress. HEENT: anicteric sclera, pupils reactive to light and accommodation CVS: S1-S2 clear no murmur rubs or gallops Chest: clear to auscultation bilaterally, no wheezing rales or rhonchi Abdomen: soft nontender, nondistended, normal bowel sounds Extremities: no cyanosis, clubbing or edema noted bilaterally    The results of significant diagnostics from this hospitalization (including imaging, microbiology, ancillary and laboratory) are listed below for reference.    LAB RESULTS: Basic Metabolic Panel:  Recent Labs Lab 07/06/14 1300 07/07/14 0630  NA 140 139  K 4.9 4.7  CL 97 98  CO2 23 22  GLUCOSE 172* 181*  BUN 79* 33*  CREATININE 7.93* 4.23*  CALCIUM 9.4 9.2  MG 2.4 2.1   Liver Function Tests:  Recent Labs Lab 07/06/14 1300  AST 17  ALT 19  ALKPHOS 159*  BILITOT 0.4  PROT 6.7  ALBUMIN 2.5*   No results found for this basename: LIPASE,  AMYLASE,  in the last 168 hours No results found for this basename: AMMONIA,  in the last 168 hours CBC:  Recent Labs Lab 07/04/14 0303 07/06/14 1300  WBC 8.1 9.0  NEUTROABS  --  6.7  HGB 9.5* 9.6*  HCT 28.9* 29.1*  MCV 95.1 95.1  PLT 162 223   Cardiac Enzymes: No results found for this basename: CKTOTAL, CKMB, CKMBINDEX, TROPONINI,  in the last 168 hours BNP: No components found with this basename: POCBNP,  CBG:  Recent Labs Lab 07/07/14 0804 07/07/14 1156  GLUCAP 176* 141*    Significant Diagnostic Studies:  Ct Head Wo Contrast  06/27/2014   CLINICAL DATA:  Cardiac arrest following dialysis catheter placement. Hypertension.  EXAM: CT HEAD WITHOUT CONTRAST  TECHNIQUE: Contiguous axial images were obtained from the base of the skull through the vertex without intravenous contrast.  COMPARISON:  CT head without contrast 12/15/2013.  FINDINGS: Mild generalized atrophy and white matter disease is stable compared to prior exam. There is a remote lacunar infarct within the inferior midbrain. No acute infarct, hemorrhage, or mass lesion is present. The ventricles are of normal size. No significant extraaxial fluid collection is present.  The paranasal sinuses and mastoid air cells are clear. The osseous skull is intact. Atherosclerotic calcifications are noted.  IMPRESSION: 1. No acute intracranial abnormality or significant interval change. 2. Stable atrophy and white matter disease.   Electronically Signed   By: Gennette Pachris  Mattern M.D.   On: 06/27/2014 12:20   Dg Chest Port 1 View  06/28/2014   CLINICAL DATA:  Intubation, coronary artery disease post MI and CABG, personal history of type 2 diabetes, end-stage renal disease on dialysis, stroke, pulmonary hypertension, hypertension  EXAM: PORTABLE CHEST - 1 VIEW  COMPARISON:  Portable exam 0453 hr compared to 06/27/2014  FINDINGS: Tip of endotracheal tube projects 3.3 cm above carina.  Nasogastric tube extends into stomach.  LEFT jugular  large-bore dual-lumen central venous catheter tip projects over inferior RIGHT atrium, unchanged.  Enlargement of cardiac silhouette with pulmonary vascular congestion.  Perihilar infiltrates likely  edema.  Rotated to the LEFT.  No gross pleural effusion or pneumothorax.  EKG leads and epicardial pacing leads project over chest.  IMPRESSION: Probable mild pulmonary edema.   Electronically Signed   By: Ulyses SouthwardMark  Boles M.D.   On: 06/28/2014 07:50   Dg Chest Port 1 View  06/27/2014   CLINICAL DATA:  Cardiac arrest. Repositioning of the endotracheal tube.  EXAM: PORTABLE CHEST - 1 VIEW  COMPARISON:  One-view chest from the same day.  FINDINGS: The heart is enlarged. Mild edema is stable. The endotracheal tube has been pulled back and now terminates 3.7 cm above the carina, in satisfactory position. The NG tube courses off the inferior border the film. A left IJ dialysis catheter is stable, terminating in the right atrium. There is no pneumothorax or significant effusion.  IMPRESSION: 1. Interval repositioning of the endotracheal tube, now terminating 3.7 cm above the carina. 2. Stable cardiomegaly and mild edema.   Electronically Signed   By: Gennette Pachris  Mattern M.D.   On: 06/27/2014 14:06   Dg Chest Portable 1 View  06/27/2014   CLINICAL DATA:  Cardiac arrest.  Endotracheal tube placement.  EXAM: PORTABLE CHEST - 1 VIEW  COMPARISON:  One-view chest x-ray 12/15/2013  FINDINGS: The heart is enlarged. The lung volumes are low. The endotracheal tube extends into the right mainstem bronchus, cm be on the carina. A left IJ dialysis catheter is in place. There is no pneumothorax. The NG tube courses off the inferior border of the film. Mild interstitial edema is present.  IMPRESSION: 1. The endotracheal tube terminates within the right mainstem bronchus. 2. Cardiomegaly and mild edema. 3. New left IJ catheter without radiographic evidence for complication.   Electronically Signed   By: Gennette Pachris  Mattern M.D.   On: 06/27/2014  12:16    2D ECHO:  Study Conclusions  - Left ventricle: The cavity size was normal. Wall thickness was increased in a pattern of mild LVH. Systolic function was mildly reduced. The estimated ejection fraction was in the range of 45% to 50%. Probable hypokinesis of the inferior myocardium. - Aortic valve: Valve area (VTI): 1.04 cm^2. Valve area (Vmax): 0.99 cm^2. Valve area (Vmean): 0.95 cm^2. - Mitral valve: Calcified annulus. Mildly thickened leaflets . The findings are consistent with trivial stenosis. There was mild regurgitation. Valve area by pressure half-time: 2.32 cm^2. Valve area by continuity equation (using LVOT flow): 1.35 cm^2. - Left atrium: The atrium was mildly to moderately dilated. - Right ventricle: The cavity size was moderately dilated. Systolic function was mildly reduced. - Right atrium: The atrium was moderately dilated. - Pulmonary arteries: PA peak pressure: 34 mm Hg (S).  Disposition and Follow-up: Discharge Instructions   Increase activity slowly    Complete by:  As directed             DISPOSITION: LTACH  DIET: DYSPHAGIA 3, NECTAR THICK LIQUIDS, MEDS WHOLE IN PUREE. FULL SUPERVISION.   DISCHARGE FOLLOW-UP Follow-up Information   Follow up with Laurena SlimmerLARK,PRESTON S, MD. Schedule an appointment as soon as possible for a visit in 2 weeks. (for hospital follow-up)    Specialty:  Internal Medicine   Contact information:   [address] [place] [address] [phone]       Follow up with Tonny BollmanMichael Cooper, MD. Schedule an appointment as soon as possible for a visit in 2 weeks. (for hospital follow-up)    Specialty:  Cardiology   Contact information:   [address] [place]  [place] [address] [phone]       Time spent on Discharge: 40 mins  Signed:   Printice Hellmer M.D. [facility] 07/07/2014, 4:00 PM Pager: [phone]  Addendum: Patient had bigeminy with bradycardia overnight, metoprolol was  held, subsequently patient had Afib with RVR with HR in low 100's. I have requested cardiology to see patient for recommendations for disposition. In the interim, will decrease metoprolol IV to avoid bradycardia and hypotension.   Khristen Cheyney M.D. [facility] 07/07/2014, 4:00 PM  Pager: [phone]

## 2014-07-07 NOTE — Progress Notes (Signed)
Physical Therapy Treatment Patient Details Name: Briana Gould MRN: [id number] DOB: [birth year] Today's Date: 07/07/2014    History of Present Illness Pt admitted with cardiac arrest, acute respiratory failure.  PMH: cardiac arrests, CAD, ESRD, PAD with s/p BKA.    PT Comments    Pt progressing slowly towards physical therapy goals. Pt anticipating d/c to LTAC this afternoon/evening. Was not able to don prosthetic due to pain in residual limb. Attempted sit<>stand x3 and was not able to achieve full standing. Will continue to follow until d/c.   Follow Up Recommendations  SNF;Supervision/Assistance - 24 hour     Equipment Recommendations  None recommended by PT    Recommendations for Other Services       Precautions / Restrictions Precautions Precautions: Fall Restrictions Weight Bearing Restrictions: No    Mobility  Bed Mobility Overal bed mobility: Needs Assistance;+2 for physical assistance Bed Mobility: Supine to Sit;Sit to Supine     Supine to sit: Mod assist;+2 for physical assistance Sit to supine: Max assist;+2 for physical assistance   General bed mobility comments: VC's for use of bed rails for support. Specific cues to move LE's off bed. Bed pad used to scoot hips around to the EOB, with assist to elevate trunk into full sitting position.   Transfers Overall transfer level: Needs assistance Equipment used: Standard walker Transfers: Sit to/from Stand           General transfer comment: Attempted sit<>stand x3 with no success. Pt was cued for hand placement on seated surface for safety. Pt's R foot/knee was blocked, and bed pad was used to support hips during attempts. Prosthetic leg was available, however residual limb was sore and could not don shrinker due to pain.   Ambulation/Gait             General Gait Details: Unable   Stairs            Wheelchair Mobility    Modified Rankin (Stroke Patients Only)       Balance  Overall balance assessment: Needs assistance Sitting-balance support: Feet supported;No upper extremity supported Sitting balance-Leahy Scale: Poor Sitting balance - Comments: Pt requires UE support to maintain seated balance.                             Cognition Arousal/Alertness: Awake/alert Behavior During Therapy: Flat affect Overall Cognitive Status: No family/caregiver present to determine baseline cognitive functioning                      Exercises      General Comments        Pertinent Vitals/Pain Pain Assessment: Faces Faces Pain Scale: Hurts little more Pain Location: chest, L residual limb Pain Intervention(s): Limited activity within patient's tolerance;Monitored during session;Repositioned    Home Living                      Prior Function            PT Goals (current goals can now be found in the care plan section) Acute Rehab PT Goals Patient Stated Goal: Pt states she wants to go home, and is disappointed she has to go to LTAC prior to return home.  PT Goal Formulation: With patient Time For Goal Achievement: 07/18/14 Potential to Achieve Goals: Fair    Frequency  Min 2X/week    PT Plan Current plan remains appropriate    Co-evaluation  End of Session Equipment Utilized During Treatment: Gait belt Activity Tolerance: Patient limited by fatigue Patient left: in bed;with call bell/phone within reach;with bed alarm set     Time: [id number] PT Time Calculation (min): 18 min  Charges:  $Therapeutic Activity: 8-22 mins                    G Codes:      Briana Gould, Briana Gould 07/07/2014, 4:21 PM  Briana Gould, PT, DPT Acute Rehabilitation Services Pager: [phone]

## 2014-07-07 NOTE — Progress Notes (Signed)
I saw the patient and agree with the above assessment and plan.    Encouraged Pt to work with PT today.  HD tomorrow.  No new issues.

## 2014-07-07 NOTE — Progress Notes (Signed)
Subjective: She felt her heart "shaking" earlier this morning.    Objective: Vital signs in last 24 hours: Temp:  [97.8 F (36.6 C)-99.3 F (37.4 C)] 98.6 F (37 C) (10/29 0802) Pulse Rate:  [31-103] 85 (10/29 0802) Resp:  [19-22] 22 (10/29 0802) BP: (100-155)/(42-81) 129/69 mmHg (10/29 0802) SpO2:  [98 %-100 %] 99 % (10/29 0852) Weight:  [164 lb 14.5 oz (74.8 kg)-171 lb 1.2 oz (77.6 kg)] 171 lb 1.2 oz (77.6 kg) (10/28 2026) Last BM Date:  (unable to assess)  Intake/Output from previous day: 10/28 0701 - 10/29 0700 In: 0  Out: 250  Intake/Output this shift:    Medications Current Facility-Administered Medications  Medication Dose Route Frequency Provider Last Rate Last Dose  . 0.9 %  sodium chloride infusion  100 mL Intravenous PRN Maree Krabbeobert D Schertz, MD      . 0.9 %  sodium chloride infusion  100 mL Intravenous PRN Maree Krabbeobert D Schertz, MD      . acetaminophen (TYLENOL) tablet 650 mg  650 mg Oral Q6H PRN Drema Dallasurtis J Woods, MD   650 mg at 07/06/14 2109  . antiseptic oral rinse (CPC / CETYLPYRIDINIUM CHLORIDE 0.05%) solution 7 mL  7 mL Mouth Rinse q12n4p Cyril Mourningakesh Alva V, MD   7 mL at 07/07/14 1600  . aspirin chewable tablet 81 mg  81 mg Oral Daily Lonia BloodJeffrey T McClung, MD   81 mg at 07/07/14 1143  . chlorhexidine (PERIDEX) 0.12 % solution 15 mL  15 mL Mouth Rinse BID Oretha Milchakesh Alva V, MD   15 mL at 07/06/14 2109  . [START ON 07/11/2014] darbepoetin (ARANESP) injection 25 mcg  25 mcg Intravenous Q Mon-HD Arita Missyan B Sanford, MD      . dextromethorphan-guaiFENesin [facility](MUCINEX DM) 30-600 MG per 12 hr tablet 1 tablet  1 tablet Oral BID Drema Dallasurtis J Woods, MD   1 tablet at 07/07/14 1125  . doxercalciferol (HECTOROL) injection 5 mcg  5 mcg Intravenous Q M,W,F-HD Maree Krabbeobert D Schertz, MD   5 mcg at 07/06/14 1750  . feeding supplement (NEPRO CARB STEADY) liquid 237 mL  237 mL Oral BID BM Stephanie La, RD   237 mL at 07/07/14 1547  . feeding supplement (PRO-STAT SUGAR FREE 64) liquid 30 mL  30 mL Oral BID Marijean NiemannStephanie  La, RD   30 mL at 07/07/14 1126  . ferric gluconate (NULECIT) 62.5 mg in sodium chloride 0.9 % 100 mL IVPB  62.5 mg Intravenous Q Wed-HD Maree Krabbeobert D Schertz, MD   62.5 mg at 07/06/14 1700  . fluticasone (FLOVENT HFA) 110 MCG/ACT inhaler 2 puff  2 puff Inhalation BID Lonia BloodJeffrey T McClung, MD   2 puff at 07/07/14 [phone]  . heparin injection 5,000 Units  5,000 Units Dialysis Once in dialysis Arita Missyan B Sanford, MD      . insulin aspart (novoLOG) injection 0-5 Units  0-5 Units Subcutaneous QHS Ripudeep K Rai, MD      . insulin aspart (novoLOG) injection 0-9 Units  0-9 Units Subcutaneous TID WC Ripudeep K Rai, MD      . metoprolol (LOPRESSOR) injection 2.5 mg  2.5 mg Intravenous 4 times per day Ripudeep Jenna LuoK Rai, MD      . midodrine (PROAMATINE) tablet 5 mg  5 mg Oral Q M,W,F-HD Lonia BloodJeffrey T McClung, MD   5 mg at 07/06/14 1239  . multivitamin (RENA-VIT) tablet 1 tablet  1 tablet Oral QHS Sheffield SliderMartha B. Bergman, PA-C      . pneumococcal 23 valent vaccine (PNU-IMMUNE) injection  0.5 mL  0.5 mL Intramuscular Tomorrow-1000 Cyril Mourningakesh Alva V, MD      . prednisoLONE acetate (PRED FORTE) 1 % ophthalmic suspension 1 drop  1 drop Both Eyes TID Simonne MartinetPeter E Babcock, NP   1 drop at 07/07/14 1548  . RESOURCE THICKENUP CLEAR   Oral PRN Drema Dallasurtis J Woods, MD      . sevelamer carbonate (RENVELA) tablet 1,600 mg  1,600 mg Oral TID WC Sheffield SliderMartha B. Bergman, PA-C   1,600 mg at 07/07/14 1125    PE: General appearance: alert, cooperative and no distress Lungs: clear to auscultation bilaterally Heart: Reg rhythm.  Slow. 1/6 sys MM Extremities: No LEE Pulses: 1+ Radials and right PT.   Skin: Warm and dry Neurologic: Grossly normal  Lab Results:   Recent Labs  07/06/14 1300  WBC 9.0  HGB 9.6*  HCT 29.1*  PLT 223   BMET  Recent Labs  07/06/14 1300 07/07/14 0630  NA 140 139  K 4.9 4.7  CL 97 98  CO2 23 22  GLUCOSE 172* 181*  BUN 79* 33*  CREATININE 7.93* 4.23*  CALCIUM 9.4 9.2   PT/INR No results found for this basename: LABPROT, INR,   in the last 72 hours Cholesterol  Recent Labs  07/06/14 1300  CHOL 147      Assessment/Plan  Principal Problem:   Cardiac arrest Active Problems:   Diabetes mellitus with end stage renal disease   ESRD (end stage renal disease) on dialysis   CAD- severe 3V CAD at cath March 2015- medical Rx   Acute respiratory failure with hypoxia   Hypertension   Encephalopathy acute   Paroxysmal a-fib   Physical deconditioning  1. CAD  - IS resting comfortably. No CP or SOB.  No CP during Afib RVR this morning either.  - Dr Earmon Phoenixooper's clinic note 12/07/13 the patient based on her comorbidities and coronary anatomy does not have revascularization options as far as her severe CAD  - unclear if CAD related to this event, from H&P and family at bedside today, initial concern was possible respiratory initiated arrest. No documented ventricular arrhythmias, echo overall stable, history suggests possible respiratory event related to sedation  - continue current medical therapy for CAD, she is not a candidate for revasc.   2. HCAP  - abx per primary team  3. Paroxysmal Afib  She was in RVR between ~0540hrs and 0900hrs this morning.  Back in SR-bigeminy.  Lateral ST depression on EKG while in RVR.  Not a candidate for anticoagulation  Continue metoprolol.  RN said she was changed to IV 2.5mg  Q6hr so she could transfer to [facility].  Her BP will probably tolerate 5mg  Q6.  If she has more Afib, I would increase the dose.   4. ESRD   - per renal    LOS: 10 days    HAGER, BRYAN PA-C 07/07/2014 4:11 PM  Personally seen and examined. Agree with above. Donato SchultzSKAINS, Yurani Fettes, MD

## 2014-07-07 NOTE — Progress Notes (Signed)
[facility]. Per RN at [facility], patient needs to have a PIV prior to transfer. IV team notified. Will transfer patient after PIV obtained.  Leanna BattlesEckelmann, Belissa Kooy Eileen, RN.

## 2014-07-07 NOTE — Progress Notes (Signed)
[facility] Progress Note  Assessment/Plan: 89F [facility] w/ arrest at vascular access center (third arrest total) in setting of severe ASCVD, chronic SHF, PVD s/p AKA. S/p Cooling. Has HCAP on Vanc/Ceftaz. Persistent AMS  1. Cardioresp Arrest: s/p cooling. Resolved 2. [facility]; Keep SBP > 130 - HD tomorrow 3. Vascular Access: Qb at 300 last Tx. AVF had infiltrated. Unable to cannulate Wed, used cath - Dr. Marisue HumbleSanford discussed with Dr. Juel BurrowLin - who said she had direct compression of AVF by recent hematoma and should rest 2-3 weeks  4. HTN/Vol: Keep SPB >130 mmHg at HD.- BP has been lower than this  EDW 75kg as outpt, net UF 250 L on Wed with post weight of 74.8 5. 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 Resume Renveal 2qAC Ca ok - need to check P levels 6. Anemia of CKD: Maintenance Fe qWed. Place on qWk Aranesp w/ acute illness, Hb 9.6 stable 7. HCAP: per [facility]; Vanc/Ceftaz 8. AMS: slow to resolve 9. DM2 10. CAD, not candidate for intervention 11. Nutrition - D3 diet - intake poor; Alb 2.5- on 220 recorded as intake yesterday- would expect to lose wt with poor intake - supplements ordered- add vits  Sheffield SliderMartha B Lawrencia Mauney, PA-C [facility] Beeper [phone] 07/07/2014,10:35 AM  LOS: 10 days   Subjective:   Didn't eat any breakfast - doesn't want it warmed up now. Denies problems with HD  Objective Filed Vitals:   07/07/14 0504 07/07/14 0704 07/07/14 0802 07/07/14 0852  BP:  100/71 129/69   Pulse: 60 103 85   Temp:   98.6 F (37 C)   TempSrc:   Oral   Resp:   22   Height:      Weight:      SpO2:   100% 99%   Physical Exam General: NAD Heart: RRR Lungs: no rales Abdomen: soft Extremities: right BKA no edema; LLE no overt edema Dialysis Access: left upper AVF + B/T right IJ  Dialysis Orders: [name] MWF F180 4h 75kg 400/800 2/2.0 [place] Heparin 5000  Aranesp 25 every 2 weeks (last 10/07), Venofer 50 mg / wed, Hectorol 5 ug TIW   Additional  Objective Labs: Basic Metabolic Panel:  Recent Labs Lab 07/04/14 0303 07/06/14 1300 07/07/14 0630  NA 144 140 139  K 5.1 4.9 4.7  CL 100 97 98  CO2 23 23 22   GLUCOSE 155* 172* 181*  BUN 104* 79* 33*  CREATININE 8.95* 7.93* 4.23*  CALCIUM 9.4 9.4 9.2   Liver Function Tests:  Recent Labs Lab 07/06/14 1300  AST 17  ALT 19  ALKPHOS 159*  BILITOT 0.4  PROT 6.7  ALBUMIN 2.5*   CBC:  Recent Labs Lab 06/30/14 1045 07/01/14 0445 07/04/14 0303 07/06/14 1300  WBC 11.2* 9.5 8.1 9.0  NEUTROABS  --  7.5  --  6.7  HGB 11.2* 10.1* 9.5* 9.6*  HCT 33.7* 31.1* 28.9* 29.1*  MCV 96.8 94.8 95.1 95.1  PLT 182 165 162 223   Blood Culture    Component Value Date/Time   SDES BLOOD RIGHT THUMB 06/30/2014 1226   SPECREQUEST BOTTLES DRAWN AEROBIC ONLY 5CC 06/30/2014 1226   CULT  Value: NO GROWTH 5 DAYS Performed at [facility] 06/30/2014 1226   REPTSTATUS 07/06/2014 FINAL 06/30/2014 1226  CBG:  Recent Labs Lab 07/06/14 1838 07/06/14 2021 07/06/14 2351 07/07/14 0422 07/07/14 0804  GLUCAP 77 134* 121* 146* 176*   IMedications:   . antiseptic oral  rinse  7 mL Mouth Rinse q12n4p  . aspirin  81 mg Oral Daily  . cefTAZidime (FORTAZ)  IV  2 g Intravenous Q M,W,F-2000  . chlorhexidine  15 mL Mouth Rinse BID  . [START ON 07/11/2014] darbepoetin (ARANESP) injection - DIALYSIS  25 mcg Intravenous Q Mon-HD  . dextromethorphan-guaiFENesin  1 tablet Oral BID  . doxercalciferol  5 mcg Intravenous Q M,W,F-HD  . feeding supplement (NEPRO CARB STEADY)  237 mL Oral BID BM  . feeding supplement (PRO-STAT SUGAR FREE 64)  30 mL Oral BID  . ferric gluconate (FERRLECIT/NULECIT) IV  62.5 mg Intravenous Q Wed-HD  . fluticasone  2 puff Inhalation BID  . heparin  5,000 Units Dialysis Once in dialysis  . insulin aspart  0-9 Units Subcutaneous 6 times per day  . metoprolol  5 mg Intravenous 4 times per day  . midodrine  5 mg Oral Q M,W,F-HD  . pneumococcal 23 valent vaccine  0.5 mL  Intramuscular Tomorrow-1000  . prednisoLONE acetate  1 drop Both Eyes TID  . vancomycin  750 mg Intravenous Q M,W,F-HD

## 2014-07-08 ENCOUNTER — Other Ambulatory Visit (HOSPITAL_COMMUNITY): Payer: Self-pay

## 2014-07-08 LAB — BASIC METABOLIC PANEL
Anion gap: 20 — ABNORMAL HIGH (ref 5–15)
BUN: 47 mg/dL — ABNORMAL HIGH (ref 6–23)
CO2: 20 mEq/L (ref 19–32)
Calcium: 9.3 mg/dL (ref 8.4–10.5)
Chloride: 97 mEq/L (ref 96–112)
Creatinine, Ser: 5.78 mg/dL — ABNORMAL HIGH (ref 0.50–1.10)
GFR calc Af Amer: 8 mL/min — ABNORMAL LOW (ref >90)
GFR calc non Af Amer: 7 mL/min — ABNORMAL LOW (ref >90)
GLUCOSE: 127 mg/dL — AB (ref 70–99)
Potassium: 5 mEq/L (ref 3.7–5.3)
Sodium: 137 mEq/L (ref 137–147)

## 2014-07-08 LAB — CBC
HCT: 29.1 % — ABNORMAL LOW (ref 36.0–46.0)
HEMOGLOBIN: 9.5 g/dL — AB (ref 12.0–15.0)
MCH: 31.4 pg (ref 26.0–34.0)
MCHC: 32.6 g/dL (ref 30.0–36.0)
MCV: 96 fL (ref 78.0–100.0)
PLATELETS: 231 10*3/uL (ref 150–400)
RBC: 3.03 MIL/uL — ABNORMAL LOW (ref 3.87–5.11)
RDW: 15.5 % (ref 11.5–15.5)
WBC: 10.5 10*3/uL (ref 4.0–10.5)

## 2014-07-08 LAB — TSH: TSH: 1.05 u[IU]/mL (ref 0.350–4.500)

## 2014-07-09 ENCOUNTER — Other Ambulatory Visit (HOSPITAL_COMMUNITY): Payer: Self-pay

## 2014-07-09 LAB — TROPONIN I
Troponin I: <0.3 ng/mL (ref <0.30)
Troponin I: <0.3 ng/mL (ref <0.30)

## 2014-07-09 LAB — CK TOTAL AND CKMB (NOT AT ARMC)
CK, MB: 2.5 ng/mL (ref 0.3–4.0)
CK, MB: 2.3 ng/mL (ref 0.3–4.0)
RELATIVE INDEX: INVALID (ref 0.0–2.5)
Relative Index: INVALID (ref 0.0–2.5)
Total CK: 69 U/L (ref 7–177)
Total CK: 71 U/L (ref 7–177)

## 2014-07-11 LAB — BASIC METABOLIC PANEL
Anion gap: 20 — ABNORMAL HIGH (ref 5–15)
BUN: 66 mg/dL — ABNORMAL HIGH (ref 6–23)
CO2: 23 mEq/L (ref 19–32)
Calcium: 9.6 mg/dL (ref 8.4–10.5)
Chloride: 92 mEq/L — ABNORMAL LOW (ref 96–112)
Creatinine, Ser: 7.11 mg/dL — ABNORMAL HIGH (ref 0.50–1.10)
GFR, EST AFRICAN AMERICAN: 6 mL/min — AB (ref >90)
GFR, EST NON AFRICAN AMERICAN: 5 mL/min — AB (ref >90)
Glucose, Bld: 166 mg/dL — ABNORMAL HIGH (ref 70–99)
POTASSIUM: 5.9 meq/L — AB (ref 3.7–5.3)
SODIUM: 135 meq/L — AB (ref 137–147)

## 2014-07-11 LAB — URINALYSIS, ROUTINE W REFLEX MICROSCOPIC
GLUCOSE, UA: 100 mg/dL — AB
Ketones, ur: 15 mg/dL — AB
Nitrite: NEGATIVE
Protein, ur: 100 mg/dL — AB
SPECIFIC GRAVITY, URINE: 1.019 (ref 1.005–1.030)
Urobilinogen, UA: 0.2 mg/dL (ref 0.0–1.0)
pH: 6 (ref 5.0–8.0)

## 2014-07-11 LAB — URINE MICROSCOPIC-ADD ON

## 2014-07-11 LAB — CBC
HCT: 32 % — ABNORMAL LOW (ref 36.0–46.0)
HEMOGLOBIN: 10.3 g/dL — AB (ref 12.0–15.0)
MCH: 32.3 pg (ref 26.0–34.0)
MCHC: 32.2 g/dL (ref 30.0–36.0)
MCV: 100.3 fL — ABNORMAL HIGH (ref 78.0–100.0)
Platelets: 272 10*3/uL (ref 150–400)
RBC: 3.19 MIL/uL — ABNORMAL LOW (ref 3.87–5.11)
RDW: 16.7 % — AB (ref 11.5–15.5)
WBC: 16.4 10*3/uL — ABNORMAL HIGH (ref 4.0–10.5)

## 2014-07-12 ENCOUNTER — Other Ambulatory Visit (HOSPITAL_COMMUNITY): Payer: Self-pay

## 2014-07-12 LAB — CBC WITH DIFFERENTIAL/PLATELET
BASOS PCT: 0 % (ref 0–1)
Basophils Absolute: 0 10*3/uL (ref 0.0–0.1)
EOS PCT: 1 % (ref 0–5)
Eosinophils Absolute: 0.2 10*3/uL (ref 0.0–0.7)
HEMATOCRIT: 30.3 % — AB (ref 36.0–46.0)
HEMOGLOBIN: 10 g/dL — AB (ref 12.0–15.0)
LYMPHS ABS: 1.4 10*3/uL (ref 0.7–4.0)
Lymphocytes Relative: 10 % — ABNORMAL LOW (ref 12–46)
MCH: 32.9 pg (ref 26.0–34.0)
MCHC: 33 g/dL (ref 30.0–36.0)
MCV: 99.7 fL (ref 78.0–100.0)
MONO ABS: 0.6 10*3/uL (ref 0.1–1.0)
Monocytes Relative: 4 % (ref 3–12)
NEUTROS PCT: 85 % — AB (ref 43–77)
Neutro Abs: 11.7 10*3/uL — ABNORMAL HIGH (ref 1.7–7.7)
Platelets: 212 10*3/uL (ref 150–400)
RBC: 3.04 MIL/uL — AB (ref 3.87–5.11)
RDW: 16.9 % — ABNORMAL HIGH (ref 11.5–15.5)
WBC: 14 10*3/uL — ABNORMAL HIGH (ref 4.0–10.5)

## 2014-07-12 LAB — URINE CULTURE
COLONY COUNT: NO GROWTH
Culture: NO GROWTH

## 2014-07-12 LAB — PROCALCITONIN: Procalcitonin: 1.53 ng/mL

## 2014-07-13 LAB — RENAL FUNCTION PANEL
ALBUMIN: 2.4 g/dL — AB (ref 3.5–5.2)
Anion gap: 19 — ABNORMAL HIGH (ref 5–15)
BUN: 65 mg/dL — ABNORMAL HIGH (ref 6–23)
CALCIUM: 9.2 mg/dL (ref 8.4–10.5)
CO2: 21 meq/L (ref 19–32)
CREATININE: 7.58 mg/dL — AB (ref 0.50–1.10)
Chloride: 94 mEq/L — ABNORMAL LOW (ref 96–112)
GFR calc Af Amer: 6 mL/min — ABNORMAL LOW (ref >90)
GFR calc non Af Amer: 5 mL/min — ABNORMAL LOW (ref >90)
Glucose, Bld: 115 mg/dL — ABNORMAL HIGH (ref 70–99)
Phosphorus: 6.1 mg/dL — ABNORMAL HIGH (ref 2.3–4.6)
Potassium: 5.7 mEq/L — ABNORMAL HIGH (ref 3.7–5.3)
Sodium: 134 mEq/L — ABNORMAL LOW (ref 137–147)

## 2014-07-13 LAB — PROCALCITONIN: Procalcitonin: 1.14 ng/mL

## 2014-07-13 LAB — CBC WITH DIFFERENTIAL/PLATELET
BASOS ABS: 0 10*3/uL (ref 0.0–0.1)
Basophils Relative: 0 % (ref 0–1)
EOS ABS: 0.2 10*3/uL (ref 0.0–0.7)
EOS PCT: 2 % (ref 0–5)
HCT: 29.2 % — ABNORMAL LOW (ref 36.0–46.0)
Hemoglobin: 9.5 g/dL — ABNORMAL LOW (ref 12.0–15.0)
Lymphocytes Relative: 13 % (ref 12–46)
Lymphs Abs: 1.6 10*3/uL (ref 0.7–4.0)
MCH: 31.8 pg (ref 26.0–34.0)
MCHC: 32.5 g/dL (ref 30.0–36.0)
MCV: 97.7 fL (ref 78.0–100.0)
Monocytes Absolute: 0.7 10*3/uL (ref 0.1–1.0)
Monocytes Relative: 5 % (ref 3–12)
Neutro Abs: 9.6 10*3/uL — ABNORMAL HIGH (ref 1.7–7.7)
Neutrophils Relative %: 80 % — ABNORMAL HIGH (ref 43–77)
PLATELETS: 224 10*3/uL (ref 150–400)
RBC: 2.99 MIL/uL — ABNORMAL LOW (ref 3.87–5.11)
RDW: 16.7 % — AB (ref 11.5–15.5)
WBC: 12.1 10*3/uL — AB (ref 4.0–10.5)

## 2014-07-14 ENCOUNTER — Other Ambulatory Visit (HOSPITAL_COMMUNITY): Payer: Self-pay

## 2014-07-14 LAB — CBC
HCT: 29.5 % — ABNORMAL LOW (ref 36.0–46.0)
HEMOGLOBIN: 9.4 g/dL — AB (ref 12.0–15.0)
MCH: 31.4 pg (ref 26.0–34.0)
MCHC: 31.9 g/dL (ref 30.0–36.0)
MCV: 98.7 fL (ref 78.0–100.0)
PLATELETS: 214 10*3/uL (ref 150–400)
RBC: 2.99 MIL/uL — ABNORMAL LOW (ref 3.87–5.11)
RDW: 16.6 % — AB (ref 11.5–15.5)
WBC: 11 10*3/uL — ABNORMAL HIGH (ref 4.0–10.5)

## 2014-07-14 LAB — BASIC METABOLIC PANEL
ANION GAP: 15 (ref 5–15)
BUN: 38 mg/dL — ABNORMAL HIGH (ref 6–23)
CO2: 26 mEq/L (ref 19–32)
Calcium: 9.1 mg/dL (ref 8.4–10.5)
Chloride: 95 mEq/L — ABNORMAL LOW (ref 96–112)
Creatinine, Ser: 5.24 mg/dL — ABNORMAL HIGH (ref 0.50–1.10)
GFR, EST AFRICAN AMERICAN: 9 mL/min — AB (ref >90)
GFR, EST NON AFRICAN AMERICAN: 8 mL/min — AB (ref >90)
Glucose, Bld: 127 mg/dL — ABNORMAL HIGH (ref 70–99)
POTASSIUM: 5 meq/L (ref 3.7–5.3)
Sodium: 136 mEq/L — ABNORMAL LOW (ref 137–147)

## 2014-07-14 LAB — PARATHYROID HORMONE, INTACT (NO CA): PTH: 769 pg/mL — ABNORMAL HIGH (ref 14–64)

## 2014-07-15 DIAGNOSIS — T829XXA Unspecified complication of cardiac and vascular prosthetic device, implant and graft, initial encounter: Secondary | ICD-10-CM

## 2014-07-15 DIAGNOSIS — Z992 Dependence on renal dialysis: Secondary | ICD-10-CM

## 2014-07-15 LAB — RENAL FUNCTION PANEL
Albumin: 2.4 g/dL — ABNORMAL LOW (ref 3.5–5.2)
Anion gap: 18 — ABNORMAL HIGH (ref 5–15)
BUN: 57 mg/dL — ABNORMAL HIGH (ref 6–23)
CALCIUM: 9.3 mg/dL (ref 8.4–10.5)
CO2: 23 mEq/L (ref 19–32)
CREATININE: 6.82 mg/dL — AB (ref 0.50–1.10)
Chloride: 94 mEq/L — ABNORMAL LOW (ref 96–112)
GFR calc Af Amer: 6 mL/min — ABNORMAL LOW (ref >90)
GFR, EST NON AFRICAN AMERICAN: 6 mL/min — AB (ref >90)
Glucose, Bld: 101 mg/dL — ABNORMAL HIGH (ref 70–99)
PHOSPHORUS: 4.7 mg/dL — AB (ref 2.3–4.6)
Potassium: 5.4 mEq/L — ABNORMAL HIGH (ref 3.7–5.3)
SODIUM: 135 meq/L — AB (ref 137–147)

## 2014-07-15 LAB — HEMOGLOBIN AND HEMATOCRIT, BLOOD
HCT: 27.3 % — ABNORMAL LOW (ref 36.0–46.0)
HCT: 23.8 % — ABNORMAL LOW (ref 36.0–46.0)
Hemoglobin: 8.9 g/dL — ABNORMAL LOW (ref 12.0–15.0)
Hemoglobin: 7.8 g/dL — ABNORMAL LOW (ref 12.0–15.0)

## 2014-07-15 LAB — HEMOGLOBIN: HEMOGLOBIN: 9.9 g/dL — AB (ref 12.0–15.0)

## 2014-07-15 NOTE — Progress Notes (Signed)
*  PRELIMINARY RESULTS* Vascular Ultrasound Duplex Dialysis Access (AVF, AGV) has been completed.  Preliminary findings: Technically limited due to bandages over patient's fistula that prevent full visualization on the ultrasound. Appears to be patent left AV fistula. An area with mixed echoes is visualized in the left mid upper arm, superficial to fistula, which is possibly a hematoma.  Briana DemarkJill Eunice, RDMS, RVT  07/15/2014, 6:20 PM

## 2014-07-16 LAB — BASIC METABOLIC PANEL
ANION GAP: 18 — AB (ref 5–15)
BUN: 48 mg/dL — AB (ref 6–23)
CALCIUM: 8.9 mg/dL (ref 8.4–10.5)
CO2: 24 mEq/L (ref 19–32)
CREATININE: 5.94 mg/dL — AB (ref 0.50–1.10)
Chloride: 92 mEq/L — ABNORMAL LOW (ref 96–112)
GFR calc Af Amer: 8 mL/min — ABNORMAL LOW (ref >90)
GFR calc non Af Amer: 7 mL/min — ABNORMAL LOW (ref >90)
GLUCOSE: 124 mg/dL — AB (ref 70–99)
Potassium: 4.9 mEq/L (ref 3.7–5.3)
Sodium: 134 mEq/L — ABNORMAL LOW (ref 137–147)

## 2014-07-16 LAB — CBC
HEMATOCRIT: 22.9 % — AB (ref 36.0–46.0)
HEMOGLOBIN: 7.5 g/dL — AB (ref 12.0–15.0)
MCH: 31.9 pg (ref 26.0–34.0)
MCHC: 32.8 g/dL (ref 30.0–36.0)
MCV: 97.4 fL (ref 78.0–100.0)
PLATELETS: 220 10*3/uL (ref 150–400)
RBC: 2.35 MIL/uL — ABNORMAL LOW (ref 3.87–5.11)
RDW: 16.4 % — ABNORMAL HIGH (ref 11.5–15.5)
WBC: 11 10*3/uL — AB (ref 4.0–10.5)

## 2014-07-17 LAB — CBC
HCT: 22 % — ABNORMAL LOW (ref 36.0–46.0)
HEMOGLOBIN: 7.2 g/dL — AB (ref 12.0–15.0)
MCH: 31.7 pg (ref 26.0–34.0)
MCHC: 32.7 g/dL (ref 30.0–36.0)
MCV: 96.9 fL (ref 78.0–100.0)
Platelets: 227 10*3/uL (ref 150–400)
RBC: 2.27 MIL/uL — AB (ref 3.87–5.11)
RDW: 16.3 % — ABNORMAL HIGH (ref 11.5–15.5)
WBC: 8.7 10*3/uL (ref 4.0–10.5)

## 2014-07-17 LAB — BASIC METABOLIC PANEL
Anion gap: 18 — ABNORMAL HIGH (ref 5–15)
Anion gap: 20 — ABNORMAL HIGH (ref 5–15)
BUN: 58 mg/dL — ABNORMAL HIGH (ref 6–23)
BUN: 66 mg/dL — AB (ref 6–23)
CALCIUM: 9 mg/dL (ref 8.4–10.5)
CO2: 23 meq/L (ref 19–32)
CO2: 24 meq/L (ref 19–32)
CREATININE: 7.26 mg/dL — AB (ref 0.50–1.10)
Calcium: 9 mg/dL (ref 8.4–10.5)
Chloride: 92 mEq/L — ABNORMAL LOW (ref 96–112)
Chloride: 88 mEq/L — ABNORMAL LOW (ref 96–112)
Creatinine, Ser: 7.64 mg/dL — ABNORMAL HIGH (ref 0.50–1.10)
GFR calc Af Amer: 6 mL/min — ABNORMAL LOW (ref >90)
GFR calc Af Amer: 6 mL/min — ABNORMAL LOW (ref >90)
GFR calc non Af Amer: 5 mL/min — ABNORMAL LOW (ref >90)
GFR calc non Af Amer: 5 mL/min — ABNORMAL LOW (ref >90)
GLUCOSE: 129 mg/dL — AB (ref 70–99)
GLUCOSE: 183 mg/dL — AB (ref 70–99)
Potassium: 6 mEq/L — ABNORMAL HIGH (ref 3.7–5.3)
Potassium: 6 mEq/L — ABNORMAL HIGH (ref 3.7–5.3)
Sodium: 133 mEq/L — ABNORMAL LOW (ref 137–147)
Sodium: 132 mEq/L — ABNORMAL LOW (ref 137–147)

## 2014-07-17 LAB — MAGNESIUM: MAGNESIUM: 2.6 mg/dL — AB (ref 1.5–2.5)

## 2014-07-17 LAB — PREPARE RBC (CROSSMATCH)

## 2014-07-17 LAB — PHOSPHORUS: Phosphorus: 6 mg/dL — ABNORMAL HIGH (ref 2.3–4.6)

## 2014-07-18 ENCOUNTER — Other Ambulatory Visit (HOSPITAL_COMMUNITY): Payer: Self-pay

## 2014-07-18 LAB — RENAL FUNCTION PANEL
Albumin: 2.6 g/dL — ABNORMAL LOW (ref 3.5–5.2)
Anion gap: 22 — ABNORMAL HIGH (ref 5–15)
BUN: 69 mg/dL — AB (ref 6–23)
CALCIUM: 8.6 mg/dL (ref 8.4–10.5)
CO2: 23 meq/L (ref 19–32)
CREATININE: 8.19 mg/dL — AB (ref 0.50–1.10)
Chloride: 89 mEq/L — ABNORMAL LOW (ref 96–112)
GFR calc Af Amer: 5 mL/min — ABNORMAL LOW (ref >90)
GFR calc non Af Amer: 4 mL/min — ABNORMAL LOW (ref >90)
GLUCOSE: 143 mg/dL — AB (ref 70–99)
Phosphorus: 6.4 mg/dL — ABNORMAL HIGH (ref 2.3–4.6)
Potassium: 5.5 mEq/L — ABNORMAL HIGH (ref 3.7–5.3)
Sodium: 134 mEq/L — ABNORMAL LOW (ref 137–147)

## 2014-07-18 LAB — CBC
HEMATOCRIT: 21.7 % — AB (ref 36.0–46.0)
HEMOGLOBIN: 7.1 g/dL — AB (ref 12.0–15.0)
MCH: 31.7 pg (ref 26.0–34.0)
MCHC: 32.7 g/dL (ref 30.0–36.0)
MCV: 96.9 fL (ref 78.0–100.0)
Platelets: 244 10*3/uL (ref 150–400)
RBC: 2.24 MIL/uL — AB (ref 3.87–5.11)
RDW: 16.4 % — ABNORMAL HIGH (ref 11.5–15.5)
WBC: 8.7 10*3/uL (ref 4.0–10.5)

## 2014-07-18 LAB — HEMOGLOBIN AND HEMATOCRIT, BLOOD
HCT: 26.3 % — ABNORMAL LOW (ref 36.0–46.0)
Hemoglobin: 8.9 g/dL — ABNORMAL LOW (ref 12.0–15.0)

## 2014-07-18 LAB — CULTURE, BLOOD (ROUTINE X 2)
CULTURE: NO GROWTH
Culture: NO GROWTH

## 2014-07-18 MED ORDER — CEFAZOLIN (ANCEF) 1 G IV SOLR
2.0000 g | Freq: Once | INTRAVENOUS | Status: AC
  Administered 2014-07-18: 2 g

## 2014-07-18 MED ORDER — CEFAZOLIN SODIUM-DEXTROSE 2-3 GM-% IV SOLR
INTRAVENOUS | Status: AC
  Filled 2014-07-18: qty 50

## 2014-07-18 MED ORDER — FENTANYL CITRATE 0.05 MG/ML IJ SOLN
INTRAMUSCULAR | Status: AC | PRN
  Administered 2014-07-18 (×2): 50 ug via INTRAVENOUS

## 2014-07-18 MED ORDER — HEPARIN SODIUM (PORCINE) 1000 UNIT/ML IJ SOLN
INTRAMUSCULAR | Status: AC
  Filled 2014-07-18: qty 1

## 2014-07-18 MED ORDER — CEFAZOLIN (ANCEF) 1 G IV SOLR: 1.0000 g | INTRAVENOUS | Status: DC

## 2014-07-18 MED ORDER — FENTANYL CITRATE 0.05 MG/ML IJ SOLN
INTRAMUSCULAR | Status: AC
  Filled 2014-07-18: qty 4

## 2014-07-18 MED ORDER — CHLORHEXIDINE GLUCONATE 4 % EX LIQD
CUTANEOUS | Status: AC
  Filled 2014-07-18: qty 15

## 2014-07-18 MED ORDER — LIDOCAINE HCL 1 % IJ SOLN
INTRAMUSCULAR | Status: AC
  Filled 2014-07-18: qty 20

## 2014-07-18 MED ORDER — CEFAZOLIN (ANCEF) 1 G IV SOLR: 2.0000 g | INTRAVENOUS | Status: DC

## 2014-07-18 NOTE — Sedation Documentation (Signed)
MD gives orders to only medicate with Fentanyl for procedure. Pt had Fentanyl and Versed combination previously and went into cardiac arrest.

## 2014-07-18 NOTE — Sedation Documentation (Signed)
Successful HD cath placed  

## 2014-07-18 NOTE — Procedures (Signed)
Procedure:  Dialysis catheter exchange Left Pallindrome catheter removed over guidewire.  New 27 cm tip to cuff Hemosplit catheter placed.  Tip in RA.  OK to use.

## 2014-07-19 ENCOUNTER — Inpatient Hospital Stay: Admission: RE | Admit: 2014-07-19 | Payer: Medicare Other | Source: Ambulatory Visit

## 2014-07-19 LAB — CBC
HCT: 25.8 % — ABNORMAL LOW (ref 36.0–46.0)
HEMOGLOBIN: 8.4 g/dL — AB (ref 12.0–15.0)
MCH: 31.2 pg (ref 26.0–34.0)
MCHC: 32.6 g/dL (ref 30.0–36.0)
MCV: 95.9 fL (ref 78.0–100.0)
Platelets: 247 10*3/uL (ref 150–400)
RBC: 2.69 MIL/uL — AB (ref 3.87–5.11)
RDW: 19.2 % — ABNORMAL HIGH (ref 11.5–15.5)
WBC: 10.1 10*3/uL (ref 4.0–10.5)

## 2014-07-19 LAB — TYPE AND SCREEN
ABO/RH(D): O POS
ANTIBODY SCREEN: NEGATIVE
UNIT DIVISION: 0
Unit division: 0

## 2014-07-19 LAB — BASIC METABOLIC PANEL
Anion gap: 20 — ABNORMAL HIGH (ref 5–15)
BUN: 40 mg/dL — ABNORMAL HIGH (ref 6–23)
CALCIUM: 8.9 mg/dL (ref 8.4–10.5)
CO2: 22 meq/L (ref 19–32)
Chloride: 95 mEq/L — ABNORMAL LOW (ref 96–112)
Creatinine, Ser: 5.23 mg/dL — ABNORMAL HIGH (ref 0.50–1.10)
GFR calc Af Amer: 9 mL/min — ABNORMAL LOW (ref >90)
GFR calc non Af Amer: 8 mL/min — ABNORMAL LOW (ref >90)
GLUCOSE: 177 mg/dL — AB (ref 70–99)
Potassium: 4.7 mEq/L (ref 3.7–5.3)
SODIUM: 137 meq/L (ref 137–147)

## 2014-07-20 LAB — CBC
HCT: 26.3 % — ABNORMAL LOW (ref 36.0–46.0)
Hemoglobin: 8.6 g/dL — ABNORMAL LOW (ref 12.0–15.0)
MCH: 30.8 pg (ref 26.0–34.0)
MCHC: 32.7 g/dL (ref 30.0–36.0)
MCV: 94.3 fL (ref 78.0–100.0)
PLATELETS: 284 10*3/uL (ref 150–400)
RBC: 2.79 MIL/uL — AB (ref 3.87–5.11)
RDW: 18.7 % — ABNORMAL HIGH (ref 11.5–15.5)
WBC: 10.9 10*3/uL — AB (ref 4.0–10.5)

## 2014-07-20 LAB — RENAL FUNCTION PANEL
ALBUMIN: 2.6 g/dL — AB (ref 3.5–5.2)
ANION GAP: 19 — AB (ref 5–15)
BUN: 51 mg/dL — AB (ref 6–23)
CHLORIDE: 94 meq/L — AB (ref 96–112)
CO2: 23 meq/L (ref 19–32)
Calcium: 9 mg/dL (ref 8.4–10.5)
Creatinine, Ser: 6.74 mg/dL — ABNORMAL HIGH (ref 0.50–1.10)
GFR calc Af Amer: 7 mL/min — ABNORMAL LOW (ref >90)
GFR calc non Af Amer: 6 mL/min — ABNORMAL LOW (ref >90)
Glucose, Bld: 142 mg/dL — ABNORMAL HIGH (ref 70–99)
POTASSIUM: 5.3 meq/L (ref 3.7–5.3)
Phosphorus: 5.7 mg/dL — ABNORMAL HIGH (ref 2.3–4.6)
Sodium: 136 mEq/L — ABNORMAL LOW (ref 137–147)

## 2014-07-21 LAB — BASIC METABOLIC PANEL
ANION GAP: 22 — AB (ref 5–15)
BUN: 35 mg/dL — ABNORMAL HIGH (ref 6–23)
CHLORIDE: 94 meq/L — AB (ref 96–112)
CO2: 22 meq/L (ref 19–32)
Calcium: 9.2 mg/dL (ref 8.4–10.5)
Creatinine, Ser: 4.55 mg/dL — ABNORMAL HIGH (ref 0.50–1.10)
GFR calc non Af Amer: 9 mL/min — ABNORMAL LOW (ref >90)
GFR, EST AFRICAN AMERICAN: 10 mL/min — AB (ref >90)
Glucose, Bld: 144 mg/dL — ABNORMAL HIGH (ref 70–99)
POTASSIUM: 5.2 meq/L (ref 3.7–5.3)
Sodium: 138 mEq/L (ref 137–147)

## 2014-07-21 LAB — PHOSPHORUS: PHOSPHORUS: 5.1 mg/dL — AB (ref 2.3–4.6)

## 2014-07-21 LAB — MAGNESIUM: MAGNESIUM: 2.5 mg/dL (ref 1.5–2.5)

## 2014-07-22 LAB — RENAL FUNCTION PANEL
Albumin: 2.7 g/dL — ABNORMAL LOW (ref 3.5–5.2)
Anion gap: 17 — ABNORMAL HIGH (ref 5–15)
BUN: 49 mg/dL — ABNORMAL HIGH (ref 6–23)
CHLORIDE: 94 meq/L — AB (ref 96–112)
CO2: 25 mEq/L (ref 19–32)
CREATININE: 5.71 mg/dL — AB (ref 0.50–1.10)
Calcium: 9 mg/dL (ref 8.4–10.5)
GFR, EST AFRICAN AMERICAN: 8 mL/min — AB (ref >90)
GFR, EST NON AFRICAN AMERICAN: 7 mL/min — AB (ref >90)
Glucose, Bld: 134 mg/dL — ABNORMAL HIGH (ref 70–99)
Phosphorus: 4.6 mg/dL (ref 2.3–4.6)
Potassium: 5 mEq/L (ref 3.7–5.3)
SODIUM: 136 meq/L — AB (ref 137–147)

## 2014-07-22 LAB — CBC
HCT: 26 % — ABNORMAL LOW (ref 36.0–46.0)
Hemoglobin: 8.6 g/dL — ABNORMAL LOW (ref 12.0–15.0)
MCH: 31.9 pg (ref 26.0–34.0)
MCHC: 33.1 g/dL (ref 30.0–36.0)
MCV: 96.3 fL (ref 78.0–100.0)
PLATELETS: 290 10*3/uL (ref 150–400)
RBC: 2.7 MIL/uL — ABNORMAL LOW (ref 3.87–5.11)
RDW: 19.1 % — AB (ref 11.5–15.5)
WBC: 10.9 10*3/uL — AB (ref 4.0–10.5)

## 2014-07-23 ENCOUNTER — Other Ambulatory Visit: Payer: Self-pay | Admitting: Physical Medicine and Rehabilitation

## 2014-07-23 DIAGNOSIS — L988 Other specified disorders of the skin and subcutaneous tissue: Secondary | ICD-10-CM

## 2014-07-23 NOTE — Progress Notes (Addendum)
Ultrasound evaluation of left A-V fistula completed.  The fistula is patent.  Hematoma found 07-15-14 measured approximately 2.25 x 1.11 cm now measures 0.66 x 1.8 cm.  A second hematoma measures approximately 0.88 x 0.77 cm.

## 2014-07-25 LAB — CBC
HCT: 28 % — ABNORMAL LOW (ref 36.0–46.0)
HEMATOCRIT: 29.5 % — AB (ref 36.0–46.0)
Hemoglobin: 9.4 g/dL — ABNORMAL LOW (ref 12.0–15.0)
Hemoglobin: 9 g/dL — ABNORMAL LOW (ref 12.0–15.0)
MCH: 32.4 pg (ref 26.0–34.0)
MCH: 32.4 pg (ref 26.0–34.0)
MCHC: 31.9 g/dL (ref 30.0–36.0)
MCHC: 32.1 g/dL (ref 30.0–36.0)
MCV: 101.7 fL — ABNORMAL HIGH (ref 78.0–100.0)
MCV: 100.7 fL — AB (ref 78.0–100.0)
PLATELETS: 273 10*3/uL (ref 150–400)
Platelets: 267 10*3/uL (ref 150–400)
RBC: 2.9 MIL/uL — ABNORMAL LOW (ref 3.87–5.11)
RBC: 2.78 MIL/uL — ABNORMAL LOW (ref 3.87–5.11)
RDW: 20.3 % — ABNORMAL HIGH (ref 11.5–15.5)
RDW: 20.1 % — AB (ref 11.5–15.5)
WBC: 9.3 10*3/uL (ref 4.0–10.5)
WBC: 9.6 10*3/uL (ref 4.0–10.5)

## 2014-07-25 LAB — RENAL FUNCTION PANEL
ALBUMIN: 2.9 g/dL — AB (ref 3.5–5.2)
Albumin: 2.8 g/dL — ABNORMAL LOW (ref 3.5–5.2)
Anion gap: 18 — ABNORMAL HIGH (ref 5–15)
Anion gap: 17 — ABNORMAL HIGH (ref 5–15)
BUN: 64 mg/dL — ABNORMAL HIGH (ref 6–23)
BUN: 64 mg/dL — ABNORMAL HIGH (ref 6–23)
CALCIUM: 9.4 mg/dL (ref 8.4–10.5)
CO2: 24 mEq/L (ref 19–32)
CO2: 24 mEq/L (ref 19–32)
Calcium: 9.1 mg/dL (ref 8.4–10.5)
Chloride: 92 mEq/L — ABNORMAL LOW (ref 96–112)
Chloride: 90 mEq/L — ABNORMAL LOW (ref 96–112)
Creatinine, Ser: 7.06 mg/dL — ABNORMAL HIGH (ref 0.50–1.10)
Creatinine, Ser: 6.97 mg/dL — ABNORMAL HIGH (ref 0.50–1.10)
GFR calc Af Amer: 6 mL/min — ABNORMAL LOW (ref >90)
GFR calc non Af Amer: 5 mL/min — ABNORMAL LOW (ref >90)
GFR calc non Af Amer: 5 mL/min — ABNORMAL LOW (ref >90)
GFR, EST AFRICAN AMERICAN: 6 mL/min — AB (ref >90)
Glucose, Bld: 159 mg/dL — ABNORMAL HIGH (ref 70–99)
Glucose, Bld: 174 mg/dL — ABNORMAL HIGH (ref 70–99)
PHOSPHORUS: 3.7 mg/dL (ref 2.3–4.6)
PHOSPHORUS: 3.8 mg/dL (ref 2.3–4.6)
Potassium: 5.1 mEq/L (ref 3.7–5.3)
Potassium: 5.1 mEq/L (ref 3.7–5.3)
SODIUM: 134 meq/L — AB (ref 137–147)
Sodium: 131 mEq/L — ABNORMAL LOW (ref 137–147)

## 2014-07-26 LAB — HEPATITIS B SURFACE ANTIGEN: Hepatitis B Surface Ag: NEGATIVE

## 2014-07-27 LAB — RENAL FUNCTION PANEL
Albumin: 3.1 g/dL — ABNORMAL LOW (ref 3.5–5.2)
Anion gap: 17 — ABNORMAL HIGH (ref 5–15)
BUN: 55 mg/dL — ABNORMAL HIGH (ref 6–23)
CHLORIDE: 93 meq/L — AB (ref 96–112)
CO2: 24 meq/L (ref 19–32)
Calcium: 9.5 mg/dL (ref 8.4–10.5)
Creatinine, Ser: 5.49 mg/dL — ABNORMAL HIGH (ref 0.50–1.10)
GFR calc Af Amer: 8 mL/min — ABNORMAL LOW (ref >90)
GFR, EST NON AFRICAN AMERICAN: 7 mL/min — AB (ref >90)
Glucose, Bld: 139 mg/dL — ABNORMAL HIGH (ref 70–99)
Phosphorus: 3 mg/dL (ref 2.3–4.6)
Potassium: 5 mEq/L (ref 3.7–5.3)
SODIUM: 134 meq/L — AB (ref 137–147)

## 2014-07-27 LAB — HEMOGLOBIN AND HEMATOCRIT, BLOOD
HCT: 28.7 % — ABNORMAL LOW (ref 36.0–46.0)
Hemoglobin: 9 g/dL — ABNORMAL LOW (ref 12.0–15.0)

## 2014-07-28 ENCOUNTER — Other Ambulatory Visit (HOSPITAL_COMMUNITY): Payer: BC Managed Care – PPO

## 2014-07-28 LAB — RENAL FUNCTION PANEL
ALBUMIN: 2.9 g/dL — AB (ref 3.5–5.2)
Anion gap: 17 — ABNORMAL HIGH (ref 5–15)
BUN: 39 mg/dL — ABNORMAL HIGH (ref 6–23)
CO2: 23 meq/L (ref 19–32)
CREATININE: 4.31 mg/dL — AB (ref 0.50–1.10)
Calcium: 9.1 mg/dL (ref 8.4–10.5)
Chloride: 98 mEq/L (ref 96–112)
GFR calc non Af Amer: 10 mL/min — ABNORMAL LOW (ref >90)
GFR, EST AFRICAN AMERICAN: 11 mL/min — AB (ref >90)
Glucose, Bld: 129 mg/dL — ABNORMAL HIGH (ref 70–99)
Phosphorus: 3 mg/dL (ref 2.3–4.6)
Potassium: 4.9 mEq/L (ref 3.7–5.3)
Sodium: 138 mEq/L (ref 137–147)

## 2014-07-28 LAB — HEMOGLOBIN A1C
Hgb A1c MFr Bld: 6.3 % — ABNORMAL HIGH (ref <5.7)
MEAN PLASMA GLUCOSE: 134 mg/dL — AB (ref <117)

## 2014-07-29 ENCOUNTER — Inpatient Hospital Stay (HOSPITAL_COMMUNITY)
Admission: AD | Admit: 2014-07-29 | Discharge: 2014-08-19 | DRG: 091 | Disposition: A | Discharge status: Home Health | Payer: Medicare Other | Source: Intra-hospital | Attending: Physical Medicine & Rehabilitation | Admitting: Physical Medicine & Rehabilitation

## 2014-07-29 DIAGNOSIS — N186 End stage renal disease: Secondary | ICD-10-CM

## 2014-07-29 DIAGNOSIS — Z86718 Personal history of other venous thrombosis and embolism: Secondary | ICD-10-CM | POA: Diagnosis not present

## 2014-07-29 DIAGNOSIS — G931 Anoxic brain damage, not elsewhere classified: Secondary | ICD-10-CM | POA: Diagnosis not present

## 2014-07-29 DIAGNOSIS — Z89512 Acquired absence of left leg below knee: Secondary | ICD-10-CM | POA: Diagnosis not present

## 2014-07-29 DIAGNOSIS — I272 Other secondary pulmonary hypertension: Secondary | ICD-10-CM | POA: Diagnosis not present

## 2014-07-29 DIAGNOSIS — I2510 Atherosclerotic heart disease of native coronary artery without angina pectoris: Secondary | ICD-10-CM | POA: Diagnosis not present

## 2014-07-29 DIAGNOSIS — E119 Type 2 diabetes mellitus without complications: Secondary | ICD-10-CM | POA: Diagnosis not present

## 2014-07-29 DIAGNOSIS — E1122 Type 2 diabetes mellitus with diabetic chronic kidney disease: Secondary | ICD-10-CM | POA: Diagnosis present

## 2014-07-29 DIAGNOSIS — N185 Chronic kidney disease, stage 5: Secondary | ICD-10-CM | POA: Diagnosis not present

## 2014-07-29 DIAGNOSIS — I252 Old myocardial infarction: Secondary | ICD-10-CM | POA: Diagnosis not present

## 2014-07-29 DIAGNOSIS — N189 Chronic kidney disease, unspecified: Secondary | ICD-10-CM

## 2014-07-29 DIAGNOSIS — G7281 Critical illness myopathy: Secondary | ICD-10-CM | POA: Diagnosis present

## 2014-07-29 DIAGNOSIS — D631 Anemia in chronic kidney disease: Secondary | ICD-10-CM | POA: Diagnosis present

## 2014-07-29 DIAGNOSIS — Z992 Dependence on renal dialysis: Secondary | ICD-10-CM | POA: Diagnosis not present

## 2014-07-29 DIAGNOSIS — Z8673 Personal history of transient ischemic attack (TIA), and cerebral infarction without residual deficits: Secondary | ICD-10-CM | POA: Diagnosis not present

## 2014-07-29 DIAGNOSIS — I770 Arteriovenous fistula, acquired: Secondary | ICD-10-CM

## 2014-07-29 DIAGNOSIS — Z8674 Personal history of sudden cardiac arrest: Secondary | ICD-10-CM | POA: Diagnosis not present

## 2014-07-29 DIAGNOSIS — I10 Essential (primary) hypertension: Secondary | ICD-10-CM | POA: Diagnosis present

## 2014-07-29 DIAGNOSIS — T82818A Embolism of vascular prosthetic devices, implants and grafts, initial encounter: Secondary | ICD-10-CM | POA: Diagnosis not present

## 2014-07-29 DIAGNOSIS — I6782 Cerebral ischemia: Secondary | ICD-10-CM

## 2014-07-29 DIAGNOSIS — Z79899 Other long term (current) drug therapy: Secondary | ICD-10-CM | POA: Diagnosis not present

## 2014-07-29 DIAGNOSIS — I4891 Unspecified atrial fibrillation: Secondary | ICD-10-CM | POA: Diagnosis not present

## 2014-07-29 DIAGNOSIS — I120 Hypertensive chronic kidney disease with stage 5 chronic kidney disease or end stage renal disease: Secondary | ICD-10-CM | POA: Diagnosis not present

## 2014-07-29 DIAGNOSIS — R1310 Dysphagia, unspecified: Secondary | ICD-10-CM | POA: Diagnosis not present

## 2014-07-29 DIAGNOSIS — L89152 Pressure ulcer of sacral region, stage 2: Secondary | ICD-10-CM | POA: Diagnosis not present

## 2014-07-29 DIAGNOSIS — Z89519 Acquired absence of unspecified leg below knee: Secondary | ICD-10-CM

## 2014-07-29 HISTORY — DX: Gastrointestinal hemorrhage, unspecified: K92.2

## 2014-07-29 LAB — GLUCOSE, CAPILLARY
GLUCOSE-CAPILLARY: 233 mg/dL — AB (ref 70–99)
Glucose-Capillary: 143 mg/dL — ABNORMAL HIGH (ref 70–99)

## 2014-07-29 LAB — HEMOGLOBIN A1C
Hgb A1c MFr Bld: 6.5 % — ABNORMAL HIGH (ref <5.7)
MEAN PLASMA GLUCOSE: 140 mg/dL — AB (ref <117)

## 2014-07-29 LAB — RENAL FUNCTION PANEL
Albumin: 3.2 g/dL — ABNORMAL LOW (ref 3.5–5.2)
Anion gap: 17 — ABNORMAL HIGH (ref 5–15)
BUN: 56 mg/dL — ABNORMAL HIGH (ref 6–23)
CHLORIDE: 94 meq/L — AB (ref 96–112)
CO2: 22 meq/L (ref 19–32)
CREATININE: 5.45 mg/dL — AB (ref 0.50–1.10)
Calcium: 9.5 mg/dL (ref 8.4–10.5)
GFR calc Af Amer: 8 mL/min — ABNORMAL LOW (ref >90)
GFR, EST NON AFRICAN AMERICAN: 7 mL/min — AB (ref >90)
Glucose, Bld: 176 mg/dL — ABNORMAL HIGH (ref 70–99)
Phosphorus: 3 mg/dL (ref 2.3–4.6)
Potassium: 5.1 mEq/L (ref 3.7–5.3)
Sodium: 133 mEq/L — ABNORMAL LOW (ref 137–147)

## 2014-07-29 MED ORDER — DARBEPOETIN ALFA 60 MCG/0.3ML IJ SOSY: 60.0000 ug | PREFILLED_SYRINGE | INTRAMUSCULAR | Status: DC

## 2014-07-29 MED ORDER — METOCLOPRAMIDE HCL 5 MG PO TABS
5.0000 mg | ORAL_TABLET | Freq: Three times a day (TID) | ORAL | Status: DC
  Administered 2014-07-29 – 2014-08-01 (×11): 5 mg via ORAL
  Filled 2014-07-29 (×21): qty 1

## 2014-07-29 MED ORDER — ASPIRIN EC 81 MG PO TBEC
81.0000 mg | DELAYED_RELEASE_TABLET | Freq: Every day | ORAL | Status: DC
  Administered 2014-07-30 – 2014-08-19 (×21): 81 mg via ORAL
  Filled 2014-07-29 (×23): qty 1

## 2014-07-29 MED ORDER — FLUTICASONE PROPIONATE HFA 110 MCG/ACT IN AERO
2.0000 | INHALATION_SPRAY | Freq: Two times a day (BID) | RESPIRATORY_TRACT | Status: DC
  Administered 2014-07-30 – 2014-08-06 (×15): 2 via RESPIRATORY_TRACT
  Filled 2014-07-29: qty 12

## 2014-07-29 MED ORDER — ACETAMINOPHEN 325 MG PO TABS
325.0000 mg | ORAL_TABLET | ORAL | Status: DC | PRN
  Administered 2014-08-01 – 2014-08-04 (×3): 650 mg via ORAL
  Administered 2014-08-05 – 2014-08-06 (×3): 325 mg via ORAL
  Administered 2014-08-09 – 2014-08-12 (×6): 650 mg via ORAL
  Administered 2014-08-14: 325 mg via ORAL
  Administered 2014-08-14 – 2014-08-16 (×3): 650 mg via ORAL
  Administered 2014-08-16: 325 mg via ORAL
  Administered 2014-08-17: 650 mg via ORAL
  Filled 2014-07-29: qty 2
  Filled 2014-07-29 (×2): qty 1
  Filled 2014-07-29: qty 2
  Filled 2014-07-29: qty 1
  Filled 2014-07-29 (×5): qty 2
  Filled 2014-07-29: qty 1
  Filled 2014-07-29 (×4): qty 2

## 2014-07-29 MED ORDER — PENTAFLUOROPROP-TETRAFLUOROETH EX AERO: 1.0000 "application " | INHALATION_SPRAY | CUTANEOUS | Status: DC | PRN

## 2014-07-29 MED ORDER — MIDODRINE HCL 5 MG PO TABS
5.0000 mg | ORAL_TABLET | ORAL | Status: DC
  Administered 2014-08-02 – 2014-08-12 (×6): 5 mg via ORAL
  Filled 2014-07-29 (×6): qty 1

## 2014-07-29 MED ORDER — INSULIN ASPART 100 UNIT/ML ~~LOC~~ SOLN
0.0000 [IU] | Freq: Three times a day (TID) | SUBCUTANEOUS | Status: DC
  Administered 2014-07-29: 3 [IU] via SUBCUTANEOUS
  Administered 2014-07-30: 2 [IU] via SUBCUTANEOUS
  Administered 2014-07-30 (×2): 3 [IU] via SUBCUTANEOUS
  Administered 2014-07-31: 2 [IU] via SUBCUTANEOUS

## 2014-07-29 MED ORDER — INSULIN ASPART 100 UNIT/ML ~~LOC~~ SOLN
0.0000 [IU] | Freq: Every day | SUBCUTANEOUS | Status: DC
Start: 2014-07-29 — End: 2014-07-31

## 2014-07-29 MED ORDER — POLYETHYLENE GLYCOL 3350 17 G PO PACK
17.0000 g | PACK | Freq: Every day | ORAL | Status: DC | PRN
  Administered 2014-08-01: 17 g via ORAL
  Filled 2014-07-29 (×3): qty 1

## 2014-07-29 MED ORDER — ONDANSETRON HCL 4 MG PO TABS: 4.0000 mg | ORAL_TABLET | Freq: Four times a day (QID) | ORAL | Status: DC | PRN

## 2014-07-29 MED ORDER — SODIUM CHLORIDE 0.9 % IV SOLN: 100.0000 mL | INTRAVENOUS | Status: DC | PRN

## 2014-07-29 MED ORDER — NEPRO/CARBSTEADY PO LIQD: 237.0000 mL | ORAL | Status: DC | PRN

## 2014-07-29 MED ORDER — NEPRO/CARBSTEADY PO LIQD
237.0000 mL | Freq: Three times a day (TID) | ORAL | Status: DC
  Administered 2014-07-29 – 2014-08-19 (×47): 237 mL via ORAL
  Filled 2014-07-29 (×3): qty 237

## 2014-07-29 MED ORDER — METOPROLOL TARTRATE 12.5 MG HALF TABLET: 12.5000 mg | ORAL_TABLET | Freq: Three times a day (TID) | ORAL | Status: DC

## 2014-07-29 MED ORDER — LIDOCAINE HCL (PF) 1 % IJ SOLN: 5.0000 mL | INTRAMUSCULAR | Status: DC | PRN

## 2014-07-29 MED ORDER — METOPROLOL TARTRATE 12.5 MG HALF TABLET
12.5000 mg | ORAL_TABLET | ORAL | Status: DC
  Administered 2014-07-30 – 2014-08-14 (×29): 12.5 mg via ORAL
  Filled 2014-07-29 (×40): qty 1

## 2014-07-29 MED ORDER — HYDROCERIN EX CREA
TOPICAL_CREAM | Freq: Two times a day (BID) | CUTANEOUS | Status: DC
  Administered 2014-07-29 – 2014-08-02 (×9): via TOPICAL
  Administered 2014-08-03: 1 via TOPICAL
  Administered 2014-08-03 – 2014-08-19 (×27): via TOPICAL
  Filled 2014-07-29: qty 113

## 2014-07-29 MED ORDER — BIOTENE DRY MOUTH MT LIQD: 15.0000 mL | OROMUCOSAL | Status: DC | PRN

## 2014-07-29 MED ORDER — PREDNISOLONE ACETATE 1 % OP SUSP
1.0000 [drp] | Freq: Three times a day (TID) | OPHTHALMIC | Status: DC
  Administered 2014-07-30 – 2014-08-07 (×23): 1 [drp] via OPHTHALMIC
  Filled 2014-07-29 (×2): qty 1

## 2014-07-29 MED ORDER — ALTEPLASE 2 MG IJ SOLR
2.0000 mg | Freq: Once | INTRAMUSCULAR | Status: AC | PRN
  Filled 2014-07-29: qty 2

## 2014-07-29 MED ORDER — DIPHENHYDRAMINE HCL 12.5 MG/5ML PO ELIX
12.5000 mg | ORAL_SOLUTION | Freq: Four times a day (QID) | ORAL | Status: DC | PRN
Start: 2014-07-29 — End: 2014-08-19
  Filled 2014-07-29: qty 10

## 2014-07-29 MED ORDER — FAMOTIDINE 10 MG PO TABS
10.0000 mg | ORAL_TABLET | Freq: Two times a day (BID) | ORAL | Status: DC
  Administered 2014-07-29 – 2014-08-02 (×8): 10 mg via ORAL
  Filled 2014-07-29 (×11): qty 1

## 2014-07-29 MED ORDER — LIDOCAINE-PRILOCAINE 2.5-2.5 % EX CREA: 1.0000 "application " | TOPICAL_CREAM | CUTANEOUS | Status: DC | PRN

## 2014-07-29 MED ORDER — RENA-VITE PO TABS
1.0000 | ORAL_TABLET | Freq: Every day | ORAL | Status: DC
  Administered 2014-07-29 – 2014-08-18 (×21): 1 via ORAL
  Filled 2014-07-29 (×23): qty 1

## 2014-07-29 MED ORDER — BISACODYL 10 MG RE SUPP
10.0000 mg | Freq: Every day | RECTAL | Status: DC | PRN
  Administered 2014-08-01: 10 mg via RECTAL
  Filled 2014-07-29: qty 1

## 2014-07-29 MED ORDER — OXYCODONE HCL 5 MG PO TABS
5.0000 mg | ORAL_TABLET | ORAL | Status: DC | PRN
  Filled 2014-07-29: qty 1

## 2014-07-29 MED ORDER — FLEET ENEMA 7-19 GM/118ML RE ENEM: 1.0000 | ENEMA | Freq: Once | RECTAL | Status: AC | PRN

## 2014-07-29 MED ORDER — ENOXAPARIN SODIUM 30 MG/0.3ML ~~LOC~~ SOLN
30.0000 mg | SUBCUTANEOUS | Status: DC
  Filled 2014-07-29 (×4): qty 0.3

## 2014-07-29 MED ORDER — HEPARIN SODIUM (PORCINE) 1000 UNIT/ML DIALYSIS
20.0000 [IU]/kg | INTRAMUSCULAR | Status: DC | PRN
  Filled 2014-07-29: qty 2

## 2014-07-29 MED ORDER — GUAIFENESIN ER 600 MG PO TB12
600.0000 mg | ORAL_TABLET | Freq: Two times a day (BID) | ORAL | Status: DC
  Administered 2014-07-29 – 2014-08-02 (×8): 600 mg via ORAL
  Filled 2014-07-29 (×11): qty 1

## 2014-07-29 MED ORDER — ONDANSETRON HCL 4 MG/2ML IJ SOLN: 4.0000 mg | Freq: Four times a day (QID) | INTRAMUSCULAR | Status: DC | PRN

## 2014-07-29 MED ORDER — SEVELAMER CARBONATE 800 MG PO TABS
1600.0000 mg | ORAL_TABLET | Freq: Three times a day (TID) | ORAL | Status: DC
  Administered 2014-07-29 – 2014-08-03 (×11): 1600 mg via ORAL
  Filled 2014-07-29 (×18): qty 2

## 2014-07-29 MED ORDER — HEPARIN SODIUM (PORCINE) 1000 UNIT/ML DIALYSIS
1000.0000 [IU] | INTRAMUSCULAR | Status: DC | PRN
  Filled 2014-07-29: qty 1

## 2014-07-29 MED ORDER — ALUMINUM HYDROXIDE GEL 320 MG/5ML PO SUSP
15.0000 mL | Freq: Four times a day (QID) | ORAL | Status: DC | PRN
  Filled 2014-07-29: qty 30

## 2014-07-29 NOTE — PMR Pre-admission (Deleted)
[facility] PMR Admission Coordinator Pre-Admission Assessment  Patient: Briana Gould is an 68 y.o., female MRN: [id number] DOB: 11/14/1945 Height: 5' 6" (167.6 cm) Weight: 80.468 kg (177 lb 6.4 oz)  Insurance Information  PRIMARY: Medicare A & B Policy#: [id number] Subscriber: self Pre-Cert#: verified in [place] Employer: retired [employer] Date: A & B: 10-10-10 Deduct: $1260 Out of Pocket Max: none Life Max: unlimited CIR: 100% SNF: 100% days 1-20; 80% days 21-100 (100 day visit limit) Outpatient: 80% Co-Pay: 20% Home Health: 100% Co-Pay: none DME: 80% Co-Pay: 20% Providers: pt's preference  SECONDARY: Tricare for Life Policy#: [id number] Subscriber: pt's spouse Benefits: Phone #: [phone]   Emergency Contact Information Contact Information    Name Relation Home Work Mobile   Hachey Brooks,Dana Daughter [phone]  [phone]   Swanston,Michael Brother   [phone]      Current Medical History  Patient Admitting Diagnosis: Critical illness myopathy  History of Present Illness: 68-year-old female with h/o severe multivessel CAD (not candidate for percutaneous intervention), ESRD on HD, and PAD s/p R- BKA, h/o prior cardiac arrest X 2; who was admitted to on 06/27/2014 due to cardiopulmonary arrest past having HD cath placed at outpatient center. She received CPR for 2 minutes with return of spontaneous circulation and was unresponsive w/ ineffective ventilation and posturing on arrival to ED. She was placed under the hypothermia protocol. On 06/29/2014 she was rewarmed and extubated but remained lethargic and nonverbal with concerns of anoxic encephalopathy.   Pt was admitted to [facility] on 07/07/14 for further management. Pt's cognition has improved slowly and she has been participating well with therapies. Inpatient rehab was recommended by team at [facility] and pt was  medically cleared for inpatient rehab admission on 07/29/14.  Patient's medical record from [facility] has been reviewed by the rehabilitation admission coordinator and physician.  Past Medical History  Past Medical History  Diagnosis Date  . PAD (peripheral artery disease)     a. L BKA  . Type II diabetes mellitus   . Anemia   . History of blood transfusion 2002    "related to amputation" (08/13/2013)  . End stage renal disease on dialysis     "[facility]; M, W, F" (08/13/2013)  . DVT (deep venous thrombosis)     a. R IJ DVT 08/2013.  . CVA (cerebral infarction)     a. MRI 08/2013: possible left motor strip infarct with remote pontine infarct.  . Pulmonary HTN     a. Cath 2011 - mod to severe pulmonary hypertension. b. Echo 08/2013: PAP 45mmHg (mild-mod increased).  . Thrombocytopenia   . CAD (coronary artery disease)     a. Severe diffuse diabetic disease by cath 2011, for med rx, not a candidate for CABG or PCI at that time.  . Aortic stenosis     mild  . Syncope     a. 2011, etiology never clear.  . Carotid artery disease     a. Dopp 08/2013: 1-39% LICA< 60-79% RICA borderline >80%.  . Cerebrovascular disease     a. 08/2013 MRA brain: multiple bilateral moderate and high grade intracranial stenoses  . Cardiac arrest 08/14/13    a. Possible arrest in setting of hypotension during dialysis with possible arrest requiring brief CPR, not intubated and was fully intact after episode. Suspected due to low cerebral perfusion in setting of hypotension. Unclear if any arrhythmia.  . End stage renal disease on dialysis   . Peripheral arterial occlusive disease   .   Hypertension   . Diabetes mellitus type 2, uncontrolled, with complications   . DVT (deep venous thrombosis)     Right internal jugular vein  . CAD (coronary artery disease), native coronary  artery 2011    Diffuse distal vessel CAD noted at cardiac catheterization  . MI (myocardial infarction)   . Stroke     Family History  family history includes Heart attack in her brother; Hypertension in her father and mother; Pulmonary embolism in her father; Stroke in her mother.  Prior Rehab/Hospitalizations: pt has been to our inpatient rehab unit in February 2015. Pt has had follow up home health services and has been to skilled nursing in the past. She also had previous rehab years ago following her L BKA surgery and follow up rehab with her L LE prosthesis.  Current Medications See MAR  Patients Current Diet: Renal diet  Precautions / Restrictions Precautions Precautions: Fall (previous L BKA, tender L UE AV dialysis site) Restrictions Weight Bearing Restrictions: No   Prior Activity Level Community (5-7x/wk): Pt was independent prior to admit, driving and enjoys going to Bible Study. Pt lives with her family and three schoolage grandchildren.   Home Assistive Devices / Equipment     Prior Functional Level Current Functional Level  Bed Mobility  Independent  Mod assist   Transfers  Independent  Max assist (Mod to max assist stand pivot transfers using L LE prosthesi)   Mobility - Walk/Wheelchair  Independent  Other (not assessed at this time)   Upper Body Dressing  Independent  Min assist   Lower Body Dressing  Independent  Max assist   Grooming  Independent  Other (set up assist with pt mostly using R UE)   Eating/Drinking  Independent  Other (set up assistance)   Toilet Transfer  Independent  Other (not assessed, anticipate needs. Pt has been using bedpan)   Bladder Continence   WFL  pt does not urinate per RN    Bowel Management  WFL  using bedpan, last BM on 07-28-14 (small per Rn) OT noted that pt did perform peri-care in long sitting at [facility].  Stair Climbing   Independent   Other (not assessed at this time, anticipate needs)   Communication  WFL  WFL   Memory  WFL  WFL   Cooking/Meal Prep  Independent     Housework  Independent, though previous Epic records indicate pt had a housekeeper?    Money Management  Independent    Driving    Independent    Special needs/care consideration BiPAP/CPAP no  CPM no  Continuous Drip IV no Dialysis - yes Days - M-W-F   Note: I called HD unit to request a later HD time on dialysis days to accommodate therapy schedule.  Note: per records at [facility], pt has a new tunneled dialysis catheter in the left chest and this is currently being used as her L UE AV fistula site is tender and edematous.  Life Vest no Oxygen no  Special Bed no Trach Size no Wound Vac (area) no  Skin - pt has previous sacral stage II area upon admit to [facility] but Rn states that pt has no current skin issues. Bowel mgmt: last BM on 07-28-14 (small per RN) Bladder mgmt: pt does not urinate per RN Diabetic mgmt - yes, managed at home with insulin  Previous Home Environment Living Arrangements: Other (Comment) (pt lives with her dtr and school age grandchildren) Lives With: Family Available Help at Discharge: Family, Available   24 hours/day Type of Home: House Home Layout: One level Home Access: Ramped entrance Bathroom Shower/Tub: Walk-in shower Bathroom Toilet: Handicapped height Bathroom Accessibility: Yes How Accessible: Accessible via walker, Accessible via wheelchair Home Care Services: No Additional Comments: Rn shared that pt's husband recently passed away.  Discharge Living Setting Plans for Discharge Living Setting: Patient's home Type of Home at Discharge: House Discharge Home Layout: One level Discharge Home Access: Ramped entrance Discharge Bathroom Shower/Tub: Walk-in shower Discharge Bathroom Toilet: Handicapped height Discharge Bathroom Accessibility:  Yes  Social/Family/Support Systems Patient Roles: Other (Comment) (involved Grandmother and active in church) Contact Information: Dtr Dana is primary contact Anticipated Caregiver: Dtr Dana, brother and sister-in-law Anticipated Caregiver's Contact Information: see above Ability/Limitations of Caregiver: dtr does work and family will pull together to provide needed help. Brother and sister-in-law plan on coming to help. Caregiver Availability: 24/7 Discharge Plan Discussed with Primary Caregiver: Yes Is Caregiver In Agreement with Plan?: Yes Does Caregiver/Family have Issues with Lodging/Transportation while Pt is in Rehab?: No  Goals/Additional Needs Patient/Family Goal for Rehab: Min assist and supervision with PT/OT; NA for SLP Expected length of stay: 10-14 days Cultural Considerations: Pt is active in her church and goes to Bible studies. Dietary Needs: renal diet. Equipment Needs: to be determined Pt/Family Agrees to Admission and willing to participate: Yes Program Orientation Provided & Reviewed with Pt/Caregiver Including Roles & Responsibilities: Yes  Patient Condition: I met with pt and her brother/sister-in-law at [facility] on 07-28-14 and 07-29-14. I called and left message with pt's dtr Dana by phone. Pt and family were already familiar with our rehab program and wanted to pursue inpatient rehab to maximize her independence. This 68 year old patient was previously independent prior to her cardiac arrest event during hemodialysis. She was very active in her family and church. Pt has had an extensive medical course and is currently needing moderate to maximal assistance with bed mobility and limited transfers. Further ambulation has not yet been assessed. In addition, pt is requiring minimal to maximal assistance with self care skills. Her cognition has markedly improved and she has been participating well with therapies. Pt will benefit greatly from the  multi-disciplinary team of skilled PT, OT and rehab nursing to focus on increasing strength for greater independence in bed mobility, transfers, ambulation and self care skills. Rehab nursing will also focus on further pt/family education in regard to medication management and dialysis/diabetes teaching. In addition, pt will benefit from physician intervention to monitor her medical status following her involved medical course with cardiac arrest, respiratory failure and encephalopathy. Discussed case with Dr. Padraic Marinos, who was familiar with the pt from previous admission, and MD stated that pt is a good inpatient rehab candidate. We received medical clearance from Dr. Hijazi from [facility]. Pt and her family are motivated to come to inpatient rehab and will benefit from the intensive services of skilled therapy under rehab physician guidance. Pt will be admitted today on 07-29-14.   Preadmission Screen Completed By: Janine Turnington, PT, 07/29/2014 11:29 AM ______________________________________________________________________  Discussed status with Dr. Merissa Renwick on 07-29-14 at 1129 and received telephone approval for admission today.  Admission Coordinator: Janine Turnington, PT, time 1129/Date 07-29-14   Assessment/Plan: Diagnosis: deconditioning after multiple medical issues 1. Does the need for close, 24 hr/day Medical supervision in concert with the patient's rehab needs make it unreasonable for this patient to be served in a less intensive setting? Yes 2. Co-Morbidities requiring supervision/potential complications: dm, hx of cva, esrd 3. Due   to bladder management, bowel management, safety, skin/wound care, disease management, medication administration, pain management and patient education, does the patient require 24 hr/day rehab nursing? Yes 4. Does the patient require coordinated care of a physician, rehab nurse, PT (1-2 hrs/day, 5 days/week) and OT (1-2 hrs/day, 5 days/week) to address  physical and functional deficits in the context of the above medical diagnosis(es)? Yes Addressing deficits in the following areas: balance, endurance, locomotion, strength, transferring, bowel/bladder control, bathing, dressing, feeding, grooming and toileting 5. Can the patient actively participate in an intensive therapy program of at least 3 hrs of therapy 5 days a week? Yes 6. The potential for patient to make measurable gains while on inpatient rehab is excellent 7. Anticipated functional outcomes upon discharge from inpatients are: supervision and min assist PT, supervision and min assist OT, n/a SLP 8. Estimated rehab length of stay to reach the above functional goals is: 10-14 days 9. Does the patient have adequate social supports to accommodate these discharge functional goals? Yes 10. Anticipated D/C setting: Home 11. Anticipated post D/C treatments: HH therapy 12. Overall Rehab/Functional Prognosis: excellent    RECOMMENDATIONS: This patient's condition is appropriate for continued rehabilitative care in the following setting: CIR Patient has agreed to participate in recommended program. Yes Note that insurance prior authorization may be required for reimbursement for recommended care.  Comment: admit to rehab today.  Lashena Signer T. Evann Koelzer, MD, FAAPMR [facility] 07/29/2014   Janine Turnington, PT 07/29/2014  

## 2014-07-29 NOTE — PMR Pre-admission (Signed)
[facility] PMR Admission Coordinator Pre-Admission Assessment  Patient: Briana Gould is an 68 y.o., female MRN: [id number] DOB: March 19, 1946 Height: _0  (167.6 cm) Weight: 80.468 kg (177 lb 6.4 oz)  Insurance Information  PRIMARY: Medicare A & B Policy#: [id number] Subscriber: self Pre-Cert#: verified in [employer]: retired [employer] Date: A & B: 10-10-10 Deduct: $1260 Out of Pocket Max: none Life Max: unlimited CIR: 100% SNF: 100% days 1-20; 80% days 21-100 (100 day visit limit) Outpatient: 80% Co-Pay: 20% Home Health: 100% Co-Pay: none DME: 80% Co-Pay: 20% Providers: pt's preference  SECONDARY: Tricare for Life Policy#: [id number] Subscriber: pt's spouse Benefits: Phone #: [phone]   Emergency Contact Information Contact Information    Name Relation Home Work [place] Daughter [phone]  [phone]   Kris Mouton   [phone]      Current Medical History  Patient Admitting Diagnosis: Critical illness myopathy  History of Present Illness: 68 year old female with h/o severe multivessel CAD (not candidate for percutaneous intervention), ESRD on HD, and PAD s/p R- BKA, h/o prior cardiac arrest X 2; who was admitted to on 06/27/2014 due to cardiopulmonary arrest past having HD cath placed at outpatient center. She received CPR for 2 minutes with return of spontaneous circulation and was unresponsive w/ ineffective ventilation and posturing on arrival to ED. She was placed under the hypothermia protocol. On 06/29/2014 she was rewarmed and extubated but remained lethargic and nonverbal with concerns of anoxic encephalopathy.   Pt was admitted to [facility] on 07/07/14 for further management. Pt's cognition has improved slowly and she has been participating well with therapies. Inpatient rehab was recommended by team at [place] and pt was  medically cleared for inpatient rehab admission on 07/29/14.  Patient's medical record from [facility] has been reviewed by the rehabilitation admission coordinator and physician.  Past Medical History  Past Medical History  Diagnosis Date  . PAD (peripheral artery disease)     a. L BKA  . Type II diabetes mellitus   . Anemia   . History of blood transfusion 2002    "related to amputation" (08/13/2013)  . End stage renal disease on dialysis     "[facility]; M, W, F" (08/13/2013)  . DVT (deep venous thrombosis)     a. R IJ DVT 08/2013.  Marland Kitchen CVA (cerebral infarction)     a. MRI 08/2013: possible left motor strip infarct with remote pontine infarct.  . Pulmonary HTN     a. Cath 2011 - mod to severe pulmonary hypertension. b. Echo 08/2013: PAP 49mHg (mild-mod increased).  . Thrombocytopenia   . CAD (coronary artery disease)     a. Severe diffuse diabetic disease by cath 2011, for med rx, not a candidate for CABG or PCI at that time.  . Aortic stenosis     mild  . Syncope     a. 2011, etiology never clear.  . Carotid artery disease     a. Dopp 156/2563 18-93%LICA< 673-42%RICA borderline >80%.  . Cerebrovascular disease     a. 08/2013 MRA brain: multiple bilateral moderate and high grade intracranial stenoses  . Cardiac arrest 08/14/13    a. Possible arrest in setting of hypotension during dialysis with possible arrest requiring brief CPR, not intubated and was fully intact after episode. Suspected due to low cerebral perfusion in setting of hypotension. Unclear if any arrhythmia.  . End stage renal disease on dialysis   . Peripheral arterial occlusive disease   .  Hypertension   . Diabetes mellitus type 2, uncontrolled, with complications   . DVT (deep venous thrombosis)     Right internal jugular vein  . CAD (coronary artery disease), native coronary  artery 2011    Diffuse distal vessel CAD noted at cardiac catheterization  . MI (myocardial infarction)   . Stroke     Family History  family history includes Heart attack in her brother; Hypertension in her father and mother; Pulmonary embolism in her father; Stroke in her mother.  Prior Rehab/Hospitalizations: pt has been to our inpatient rehab unit in February 2015. Pt has had follow up home health services and has been to skilled nursing in the past. She also had previous rehab years ago following her L BKA surgery and follow up rehab with her L LE prosthesis.  Current Medications See MAR  Patients Current Diet: Renal diet  Precautions / Restrictions Precautions Precautions: Fall (previous L BKA, tender L UE AV dialysis site) Restrictions Weight Bearing Restrictions: No   Prior Activity Level Community (5-7x/wk): Pt was independent prior to admit, driving and enjoys going to [employer]. Pt lives with her family and three schoolage grandchildren.   Home Assistive Devices / Equipment     Prior Functional Level Current Functional Level  Bed Mobility  Independent  Mod assist   Transfers  Independent  Max assist (Mod to max assist stand pivot transfers using L LE prosthesi)   Mobility - Walk/Wheelchair  Independent  Other (not assessed at this time)   Upper Body Dressing  Independent  Min assist   Lower Body Dressing  Independent  Max assist   Grooming  Independent  Other (set up assist with pt mostly using R UE)   Eating/Drinking  Independent  Other (set up assistance)   Toilet Transfer  Independent  Other (not assessed, anticipate needs. Pt has been using bedpan)   Bladder Continence   WFL  pt does not urinate per RN    Bowel Management  WFL  using bedpan, last BM on 07-28-14 (small per Rn) OT noted that pt did perform peri-care in long sitting at [facility].  Stair Artist   Other (not assessed at this time, anticipate needs)   Communication  [facility]  [facility]   Memory  [facility]  WFL   Cooking/Meal Prep  Independent     Housework  Independent, though previous Epic records indicate pt had a housekeeper?    Money Management  Independent    Driving    Independent    Special needs/care consideration BiPAP/CPAP no  CPM no  Continuous Drip IV no Dialysis - yes Days - M-W-F   Note: I called HD unit to request a later HD time on dialysis days to accommodate therapy schedule.  Note: per records at [facility], pt has a new tunneled dialysis catheter in the left chest and this is currently being used as her L UE AV fistula site is tender and edematous.  Life Vest no Oxygen no  Special Bed no Trach Size no Wound Vac (area) no  Skin - pt has previous sacral stage II area upon admit to [facility] but Rn states that pt has no current skin issues. Bowel mgmt: last BM on 07-28-14 (small per RN) Bladder mgmt: pt does not urinate per RN Diabetic mgmt - yes, managed at home with insulin  Previous Home Environment Living Arrangements: Other (Comment) (pt lives with her dtr and school age grandchildren) Lives With: Family Available Help at Discharge: Family, Available  24 hours/day Type of Home: House Home Layout: One level Home Access: Ramped entrance Bathroom Shower/Tub: Multimedia programmer: Handicapped height Bathroom Accessibility: Yes How Accessible: Accessible via walker, Accessible via wheelchair [place]: No Additional Comments: Rn shared that pt's husband recently passed away.  Discharge Living Setting Plans for Discharge Living Setting: Patient's home Type of Home at Discharge: House Discharge Home Layout: One level Discharge Home Access: Ramped entrance Discharge Bathroom Shower/Tub: Walk-in shower Discharge Bathroom Toilet: Handicapped height Discharge Bathroom Accessibility:  Yes  Social/Family/Support Systems Patient Roles: Other (Comment) (involved Grandmother and active in church) Contact Information: Dtr Hinton Dyer is primary contact Anticipated Caregiver: Dtr Hinton Dyer, brother and sister-in-law Anticipated Caregiver's Contact Information: see above Ability/Limitations of Caregiver: dtr does work and family will pull together to provide needed help. Brother and sister-in-law plan on coming to help. Caregiver Availability: 24/7 Discharge Plan Discussed with Primary Caregiver: Yes Is Caregiver In Agreement with Plan?: Yes Does Caregiver/Family have Issues with Lodging/Transportation while Pt is in Rehab?: No  Goals/Additional Needs Patient/Family Goal for Rehab: Min assist and supervision with PT/OT; NA for SLP Expected length of stay: 10-14 days Cultural Considerations: Pt is active in her church and goes to Bible studies. Dietary Needs: renal diet. Equipment Needs: to be determined Pt/Family Agrees to Admission and willing to participate: Yes Program Orientation Provided & Reviewed with Pt/Caregiver Including Roles & Responsibilities: Yes  Patient Condition: I met with pt and her brother/sister-in-law at [facility] on 07-28-14 and 07-29-14. I called and left message with pt's dtr Hinton Dyer by phone. Pt and family were already familiar with our rehab program and wanted to pursue inpatient rehab to maximize her independence. This 68 year old patient was previously independent prior to her cardiac arrest event during hemodialysis. She was very active in her family and church. Pt has had an extensive medical course and is currently needing moderate to maximal assistance with bed mobility and limited transfers. Further ambulation has not yet been assessed. In addition, pt is requiring minimal to maximal assistance with self care skills. Her cognition has markedly improved and she has been participating well with therapies. Pt will benefit greatly from the  multi-disciplinary team of skilled PT, OT and rehab nursing to focus on increasing strength for greater independence in bed mobility, transfers, ambulation and self care skills. Rehab nursing will also focus on further pt/family education in regard to medication management and dialysis/diabetes teaching. In addition, pt will benefit from physician intervention to monitor her medical status following her involved medical course with cardiac arrest, respiratory failure and encephalopathy. Discussed case with Dr. Naaman Plummer, who was familiar with the pt from previous admission, and MD stated that pt is a good inpatient rehab candidate. We received medical clearance from Dr. Laren Everts from [place]. Pt and her family are motivated to come to inpatient rehab and will benefit from the intensive services of skilled therapy under rehab physician guidance. Pt will be admitted today on 07-29-14.   Preadmission Screen Completed By: Nanetta Batty, PT, 07/29/2014 11:29 AM ______________________________________________________________________  Discussed status with Dr. Naaman Plummer on 07-29-14 at 1129 and received telephone approval for admission today.  Admission Coordinator: Nanetta Batty, PT, time 1129/Date 07-29-14   Assessment/Plan: Diagnosis: deconditioning after multiple medical issues 1. Does the need for close, 24 hr/day Medical supervision in concert with the patient's rehab needs make it unreasonable for this patient to be served in a less intensive setting? Yes 2. Co-Morbidities requiring supervision/potential complications: dm, hx of cva, esrd 3. Due  to bladder management, bowel management, safety, skin/wound care, disease management, medication administration, pain management and patient education, does the patient require 24 hr/day rehab nursing? Yes 4. Does the patient require coordinated care of a physician, rehab nurse, PT (1-2 hrs/day, 5 days/week) and OT (1-2 hrs/day, 5 days/week) to address  physical and functional deficits in the context of the above medical diagnosis(es)? Yes Addressing deficits in the following areas: balance, endurance, locomotion, strength, transferring, bowel/bladder control, bathing, dressing, feeding, grooming and toileting 5. Can the patient actively participate in an intensive therapy program of at least 3 hrs of therapy 5 days a week? Yes 6. The potential for patient to make measurable gains while on inpatient rehab is excellent 7. Anticipated functional outcomes upon discharge from inpatients are: supervision and min assist PT, supervision and min assist OT, n/a SLP 8. Estimated rehab length of stay to reach the above functional goals is: 10-14 days 9. Does the patient have adequate social supports to accommodate these discharge functional goals? Yes 10. Anticipated D/C setting: Home 11. Anticipated post D/C treatments: [place] therapy 12. Overall Rehab/Functional Prognosis: excellent    RECOMMENDATIONS: This patient's condition is appropriate for continued rehabilitative care in the following setting: CIR Patient has agreed to participate in recommended program. Yes Note that insurance prior authorization may be required for reimbursement for recommended care.  Comment: admit to rehab today.  Meredith Staggers, MD, [place] [facility] 07/29/2014   Nanetta Batty, PT 07/29/2014

## 2014-07-29 NOTE — H&P (Signed)
Physical Medicine and Rehabilitation Admission H&P    CC: Critical illness myopathy  HPI:  68 year old female with h/o severe multivessel CAD (not candidate for percutaneous intervention), ESRD on HD, and PAD s/p R- BKA, h/o prior cardiac arrest X 2; who was admitted to on 06/27/2014 due to cardiopulmonary arrest past having HD cath placed at outpatient center. She received CPR for 2 minutes with return of spontaneous circulation and was unresponsive w/ ineffective ventilation and posturing on arrival to ED. She was placed under the hypothermia protocol. 2D echo with EF 45-50% with mild LVF and elevated pulmonary pressures. On 06/29/2014 she was rewarmed and extubated but remained lethargic and nonverbal with concerns of anoxic encephalopathy. CT head negative. EEG done showing diffuse cerebral disturbance--non-specific. CXR on 10/22 with LLL HCAP and patient has completed her antibiotic course. Cardiology consulted for input and recommended medical treatment as EKG and troponin negative. PCCM felt that arrest most likely due to baseline hypotension and pulmonary hypertension. A fib treated with IV Cardizem with recommendations to transition to oral BB with close monitoring as due to patient's h/o hypotension past HD. Swallow evaluation with evidence on dysphagia and patient placed on D3  Nectar liquids with compensatory strategies. She was noted to be significantly deconditioned and was transferred to [facility] on 10/29.   HD has been ongoing on MWF with midodrine and IV albumin to assist with BP support. BB changed to non-dialysis days with HR controlled and ABLA being monitored.  L-AVF has infiltrated with hematoma that's slowly resolving. Dialysis catheter exchanged by IVR on 11/9. Stage 2 sacral decub covered with foam dressing and right heel with unstable ulcer. Endurance improving and diet advanced to regular thin with chin tuck on 11/17. Therapy ongoing and patient showing increased tolerance. MD and  therapy team recommending CIR for follow up therapy.    Review of Systems  HENT: Positive for tinnitus. Negative for hearing loss.   Eyes: Negative for double vision.  Respiratory: Negative for sputum production and shortness of breath.   Cardiovascular: Positive for leg swelling. Negative for chest pain and palpitations.  Gastrointestinal: Negative for nausea and vomiting.  Musculoskeletal: Positive for myalgias. Negative for back pain and neck pain.  Neurological: Positive for weakness. Negative for headaches.  Psychiatric/Behavioral: Positive for depression. Negative for suicidal ideas and substance abuse. The patient is nervous/anxious. The patient does not have insomnia.       Past Medical History  Diagnosis Date  . PAD (peripheral artery disease)     a. L BKA  . Type II diabetes mellitus   . Anemia   . History of blood transfusion 2002    "related to amputation" (08/13/2013)  . End stage renal disease on dialysis     "[facility]; M, W, F" (08/13/2013)  . DVT (deep venous thrombosis)     a. R IJ DVT 08/2013.  Marland Kitchen CVA (cerebral infarction)     a. MRI 08/2013: possible left motor strip infarct with remote pontine infarct.  . Pulmonary HTN     a. Cath 2011 - mod to severe pulmonary hypertension. b. Echo 08/2013: PAP 5mHg (mild-mod increased).  . Thrombocytopenia   . CAD (coronary artery disease)     a. Severe diffuse diabetic disease by cath 2011, for med rx, not a candidate for CABG or PCI at that time.  . Aortic stenosis     mild  . Syncope     a. 2011, etiology never clear.  . Carotid artery disease  a. Dopp 60/4540: 9-81% LICA< 19-14% RICA borderline >80%.  . Cerebrovascular disease     a. 08/2013 MRA brain: multiple bilateral moderate and high grade intracranial stenoses  . Cardiac arrest 08/14/13    a. Possible arrest in setting of hypotension during dialysis with possible arrest requiring brief CPR, not intubated and was fully intact after episode.  Suspected due to low cerebral perfusion in setting of hypotension. Unclear if any arrhythmia.  . End stage renal disease on dialysis   . Peripheral arterial occlusive disease   . Hypertension   . Diabetes mellitus type 2, uncontrolled, with complications   . DVT (deep venous thrombosis)      Right internal jugular vein  . CAD (coronary artery disease), native coronary artery 2011    Diffuse distal vessel CAD noted at cardiac catheterization  . MI (myocardial infarction)   . Stroke     Past Surgical History  Procedure Laterality Date  . Av fistula placement Left 2011    upper arm  . Below knee leg amputation Left 2002  . Tonsillectomy    . Cataract extraction w/ intraocular lens  implant, bilateral Bilateral 2012-2014  . Esophagogastroduodenoscopy N/A 08/30/2013    Procedure: ESOPHAGOGASTRODUODENOSCOPY (EGD);  Surgeon: Beryle Beams, MD;  Location: [facility] ENDOSCOPY;  Service: Endoscopy;  Laterality: N/A;  . Below knee leg amputation Left   . Av fistula placement Left     Family History  Problem Relation Age of Onset  . Hypertension Mother   . Hypertension Father   . Heart attack Brother   . Stroke Mother   . Pulmonary embolism Father     Social History:  Recently widowed. Is the caregiver for three middle school grandchildren--brother is helping out. She reports that she has never smoked. She does not have any smokeless tobacco history on file. She reports that she does not drink alcohol or use illicit drugs.    Allergies  Allergen Reactions  . Phenergan [Promethazine Hcl] Other (See Comments)    Causes patient to" vomit more"  . Vitamin D Analogs Rash    Medications Prior to Admission  Medication Sig Dispense Refill  . acetaminophen (TYLENOL) 325 MG tablet Take 2 tablets (650 mg total) by mouth every 6 (six) hours as needed for moderate pain.    Marland Kitchen albuterol (PROVENTIL) (2.5 MG/3ML) 0.083% nebulizer solution Take 3 mLs (2.5 mg total) by nebulization every 4 (four) hours as  needed for wheezing. 75 mL 12  . Amino Acids-Protein Hydrolys (FEEDING SUPPLEMENT, PRO-STAT SUGAR FREE 64,) LIQD Take 30 mLs by mouth 2 (two) times daily at 10 AM and 5 PM. 900 mL 0  . aspirin 81 MG chewable tablet Chew 1 tablet (81 mg total) by mouth daily.    Marland Kitchen b complex-vitamin c-folic acid (NEPHRO-VITE) 0.8 MG TABS tablet Take 1 tablet by mouth daily.    Marland Kitchen dextromethorphan (DELSYM) 30 MG/5ML liquid Take 5 mLs (30 mg total) by mouth 2 (two) times daily. 89 mL 0  . famotidine (PEPCID) 20 MG tablet Take 1 tablet (20 mg total) by mouth at bedtime. 30 tablet 1  . fluticasone (FLOVENT HFA) 110 MCG/ACT inhaler Inhale 2 puffs into the lungs 2 (two) times daily.    Marland Kitchen glimepiride (AMARYL) 1 MG tablet Take 1 tablet (1 mg total) by mouth daily with breakfast. 30 tablet 1  . guaiFENesin (MUCINEX) 600 MG 12 hr tablet Take 1 tablet (600 mg total) by mouth 2 (two) times daily. 30 tablet 0  . metoprolol (LOPRESSOR)  1 MG/ML injection Inject 2.5 mLs (2.5 mg total) into the vein every 6 (six) hours. 5 mL   . midodrine (PROAMATINE) 5 MG tablet Take 1 tablet (5 mg total) by mouth every Monday, Wednesday, and Friday with hemodialysis. 30 tablet 1  . multivitamin (RENA-VIT) TABS tablet Take 1 tablet by mouth at bedtime.  0  . Nutritional Supplements (FEEDING SUPPLEMENT, NEPRO CARB STEADY,) LIQD Take 237 mLs by mouth daily.    . pantoprazole (PROTONIX) 40 MG tablet Take 1 tablet (40 mg total) by mouth 2 (two) times daily. 60 tablet 1  . prednisoLONE acetate (PRED FORTE) 1 % ophthalmic suspension Place 1 drop into both eyes 3 (three) times daily. 5 mL 0  . sevelamer carbonate (RENVELA) 800 MG tablet Take 1,600 mg by mouth 3 (three) times daily with meals.       Home:     Functional History:    Functional Status:  Mobility: Mod assist with bed mobility Max assist mod-max assist with transfers Has difficulty donning prosthesis due to edema.    ADL: Set up assist for grooming using RUE primarily  Working on  ROM BUE.  Min assist to don/doffgown.   Cognition:      Physical Exam: Blood pressure 137/53, pulse 70, resp. rate 17, SpO2 97 %. Physical Exam  Nursing note and vitals reviewed. Constitutional: She is oriented to person, place, and time. She appears well-developed and well-nourished. She has a sickly appearance.  HENT:  Head: Normocephalic and atraumatic.  Right Ear: External ear normal.  Left Ear: External ear normal.  Eyes: Conjunctivae are normal. Pupils are equal, round, and reactive to light.  Neck: Normal range of motion. Neck supple. No JVD present. No tracheal deviation present. No thyromegaly present.  Cardiovascular: Normal rate and regular rhythm.  Exam reveals friction rub.   No murmur heard. Distant sounds  Respiratory: Effort normal. No respiratory distress. She has decreased breath sounds. She has no wheezes. She has no rales.  GI: Soft. Bowel sounds are normal. She exhibits no distension. There is no tenderness. There is no rebound.  Musculoskeletal:  Right heel boggy and tender. Right shin with 1+edema, stasis changes, scabs and abraded areas. 2+ edema right foot with dry peeling skin. Toes dusky appearing with moist areas in between. L-BKA site with dry adherent scab at distal end of stump. Left biceps area with moderate edema/ ecchymosis due to resolving hematoma--+ thrill. Left arm tender near AVG   Neurological: She is alert and oriented to person, place, and time.  Speech clear. Follow commands without difficulty. LUE limited due to ongoing HD, pain from graft. RUE: 4/5. LE's: HF 2, KE 3-, right ADF/APF 4-/5. Decreased PP/LT distal RLE. DTR's 1+  Skin: Skin is warm and dry.  Psychiatric: Her speech is normal and behavior is normal. Thought content normal. Her affect is blunt. Cognition and memory are normal.  Occasionally tearful    Results for orders placed or performed during the hospital encounter of 07/07/14 (from the past 48 hour(s))  Hemoglobin A1c      Status: Abnormal   Collection Time: 07/28/14  6:40 AM  Result Value Ref Range   Hgb A1c MFr Bld 6.3 (H) <5.7 %    Comment: (NOTE)  According to the ADA Clinical Practice Recommendations for 2011, when HbA1c is used as a screening test:  >=6.5%   Diagnostic of Diabetes Mellitus           (if abnormal result is confirmed) 5.7-6.4%   Increased risk of developing Diabetes Mellitus References:Diagnosis and Classification of Diabetes Mellitus,Diabetes VZDG,3875,64(PPIRJ 1):S62-S69 and Standards of Medical Care in         Diabetes - 2011,Diabetes JOAC,1660,63 (Suppl 1):S11-S61.    Mean Plasma Glucose 134 (H) <117 mg/dL    Comment: Performed at [facility]  Renal function panel     Status: Abnormal   Collection Time: 07/28/14  6:40 AM  Result Value Ref Range   Sodium 138 137 - 147 mEq/L   Potassium 4.9 3.7 - 5.3 mEq/L   Chloride 98 96 - 112 mEq/L   CO2 23 19 - 32 mEq/L   Glucose, Bld 129 (H) 70 - 99 mg/dL   BUN 39 (H) 6 - 23 mg/dL   Creatinine, Ser 4.31 (H) 0.50 - 1.10 mg/dL   Calcium 9.1 8.4 - 10.5 mg/dL   Phosphorus 3.0 2.3 - 4.6 mg/dL   Albumin 2.9 (L) 3.5 - 5.2 g/dL   GFR calc non Af Amer 10 (L) >90 mL/min   GFR calc Af Amer 11 (L) >90 mL/min    Comment: (NOTE) The eGFR has been calculated using the CKD EPI equation. This calculation has not been validated in all clinical situations. eGFR's persistently <90 mL/min signify possible Chronic Kidney Disease.    Anion gap 17 (H) 5 - 15  Renal function panel     Status: Abnormal   Collection Time: 07/29/14  7:19 AM  Result Value Ref Range   Sodium 133 (L) 137 - 147 mEq/L   Potassium 5.1 3.7 - 5.3 mEq/L   Chloride 94 (L) 96 - 112 mEq/L   CO2 22 19 - 32 mEq/L   Glucose, Bld 176 (H) 70 - 99 mg/dL   BUN 56 (H) 6 - 23 mg/dL   Creatinine, Ser 5.45 (H) 0.50 - 1.10 mg/dL   Calcium 9.5 8.4 - 10.5 mg/dL   Phosphorus 3.0 2.3 - 4.6 mg/dL   Albumin 3.2 (L) 3.5 -  5.2 g/dL   GFR calc non Af Amer 7 (L) >90 mL/min   GFR calc Af Amer 8 (L) >90 mL/min    Comment: (NOTE) The eGFR has been calculated using the CKD EPI equation. This calculation has not been validated in all clinical situations. eGFR's persistently <90 mL/min signify possible Chronic Kidney Disease.    Anion gap 17 (H) 5 - 15   Dg Chest Port 1 View  07/28/2014   CLINICAL DATA:  Shortness of breath.  EXAM: PORTABLE CHEST - 1 VIEW  COMPARISON:  07/12/2014  FINDINGS: Double lumen central venous catheter is in the superior vena cava. There is chronic cardiomegaly. Pulmonary vascularity is normal. No infiltrates or effusions. Old right anterior rib fractures with callus formation.  IMPRESSION: No acute abnormality.  Chronic cardiomegaly.   Electronically Signed   By: Rozetta Nunnery M.D.   On: 07/28/2014 16:10       Medical Problem List and Plan: 1. Functional deficits secondary to Critical illness myopathy.  2.  DVT Prophylaxis/Anticoagulation: Pharmaceutical: Lovenox 3. Pain Management: Prn oxycodone.  4. Mood: LCSW to follow for evaluation and support. Team to provide egosupport as well.  5. Neuropsych: This patient is capable of making decisions on her own behalf. 6. Skin/Wound Care: continue foam dressing to  sacrum. Order Prevalon boot for right foot as well as elevation when in bed.  7. Fluids/Electrolytes/Nutrition: Strict I/O. 1200 FR due to ESRD. 8. ESRD: 9. CAD: 10. A fib: 11. PAD:  12. DM type II:     Post Admission Physician Evaluation: 1. Functional deficits secondary  to critical illness myopathy after cardiac arrest and multiple medical issues/prolonged hospital stay. 2. Patient is admitted to receive collaborative, interdisciplinary care between the physiatrist, rehab nursing staff, and therapy team. 3. Patient's level of medical complexity and substantial therapy needs in context of that medical necessity cannot be provided at a lesser intensity of care such as a  SNF. 4. Patient has experienced substantial functional loss from his/her baseline which was documented above under the "Functional History" and "Functional Status" headings.  Judging by the patient's diagnosis, physical exam, and functional history, the patient has potential for functional progress which will result in measurable gains while on inpatient rehab.  These gains will be of substantial and practical use upon discharge  in facilitating mobility and self-care at the household level. 5. Physiatrist will provide 24 hour management of medical needs as well as oversight of the therapy plan/treatment and provide guidance as appropriate regarding the interaction of the two. 6. 24 hour rehab nursing will assist with bladder management, bowel management, safety, skin/wound care, disease management, medication administration, pain management and patient education  and help integrate therapy concepts, techniques,education, etc. 7. PT will assess and treat for/with: Lower extremity strength, range of motion, stamina, balance, functional mobility, safety, adaptive techniques and equipment, NMR, pain mgt, prosthetic mgt, skin care, pt/family education.   Goals are: supervision to min assist. 8. OT will assess and treat for/with: ADL's, functional mobility, safety, upper extremity strength, adaptive techniques and equipment, NMR, pain mgt, prosthetic manipulation, community reintegration, education.   Goals are: supervision to min assist. Therapy may not yet proceed with showering this patient. 9. SLP will assess and treat for/with: n/a.  Goals are: n/a---pt near baseline. 10. Case Management and Social Worker will assess and treat for psychological issues and discharge planning. 11. Team conference will be held weekly to assess progress toward goals and to determine barriers to discharge. 12. Patient will receive at least 3 hours of therapy per day at least 5 days per week. 13. ELOS: 15-22 days        14. Prognosis:  good     Meredith Staggers, MD, [facility] 07/29/2014   07/29/2014

## 2014-07-29 NOTE — H&P (Signed)
Physical Medicine and Rehabilitation Admission H&P   CC: Critical illness myopathy  HPI: 68 year old female with h/o severe multivessel CAD (not candidate for percutaneous intervention), ESRD on HD, and PAD s/p R- BKA, h/o prior cardiac arrest X 2; who was admitted to on 06/27/2014 due to cardiopulmonary arrest past having HD cath placed at outpatient center. She received CPR for 2 minutes with return of spontaneous circulation and was unresponsive w/ ineffective ventilation and posturing on arrival to ED. She was placed under the hypothermia protocol. 2D echo with EF 45-50% with mild LVF and elevated pulmonary pressures. On 06/29/2014 she was rewarmed and extubated but remained lethargic and nonverbal with concerns of anoxic encephalopathy. CT head negative. EEG done showing diffuse cerebral disturbance--non-specific. CXR on 10/22 with LLL HCAP and patient has completed her antibiotic course. Cardiology consulted for input and recommended medical treatment as EKG and troponin negative. PCCM felt that arrest most likely due to baseline hypotension and pulmonary hypertension. A fib treated with IV Cardizem with recommendations to transition to oral BB with close monitoring as due to patient's h/o hypotension past HD. Swallow evaluation with evidence on dysphagia and patient placed on D3 Nectar liquids with compensatory strategies. She was noted to be significantly deconditioned and was transferred to [facility] on 10/29.   HD has been ongoing on MWF with midodrine and IV albumin to assist with BP support. BB changed to non-dialysis days with HR controlled and ABLA being monitored. L-AVF has infiltrated with hematoma that's slowly resolving. Dialysis catheter exchanged by IVR on 11/9. Stage 2 sacral decub covered with foam dressing and right heel with unstable ulcer. Endurance improving and diet advanced to regular thin with chin tuck on 11/17. Therapy ongoing and patient showing increased tolerance. MD  and therapy team recommending CIR for follow up therapy.    Review of Systems  HENT: Positive for tinnitus. Negative for hearing loss.  Eyes: Negative for double vision.  Respiratory: Negative for sputum production and shortness of breath.  Cardiovascular: Positive for leg swelling. Negative for chest pain and palpitations.  Gastrointestinal: Negative for nausea and vomiting.  Musculoskeletal: Positive for myalgias. Negative for back pain and neck pain.  Neurological: Positive for weakness. Negative for headaches.  Psychiatric/Behavioral: Positive for depression. Negative for suicidal ideas and substance abuse. The patient is nervous/anxious. The patient does not have insomnia.      Past Medical History  Diagnosis Date  . PAD (peripheral artery disease)     a. L BKA  . Type II diabetes mellitus   . Anemia   . History of blood transfusion 2002    "related to amputation" (08/13/2013)  . End stage renal disease on dialysis     "[facility]; M, W, F" (08/13/2013)  . DVT (deep venous thrombosis)     a. R IJ DVT 08/2013.  Marland Kitchen CVA (cerebral infarction)     a. MRI 08/2013: possible left motor strip infarct with remote pontine infarct.  . Pulmonary HTN     a. Cath 2011 - mod to severe pulmonary hypertension. b. Echo 08/2013: PAP 40mHg (mild-mod increased).  . Thrombocytopenia   . CAD (coronary artery disease)     a. Severe diffuse diabetic disease by cath 2011, for med rx, not a candidate for CABG or PCI at that time.  . Aortic stenosis     mild  . Syncope     a. 2011, etiology never clear.  . Carotid artery disease     a. Dopp 08/2013: 1-39%  LICA< 31-51% RICA borderline >80%.  . Cerebrovascular disease     a. 08/2013 MRA brain: multiple bilateral moderate and high grade intracranial stenoses  . Cardiac arrest 08/14/13    a. Possible arrest in setting of hypotension during dialysis with  possible arrest requiring brief CPR, not intubated and was fully intact after episode. Suspected due to low cerebral perfusion in setting of hypotension. Unclear if any arrhythmia.  . End stage renal disease on dialysis   . Peripheral arterial occlusive disease   . Hypertension   . Diabetes mellitus type 2, uncontrolled, with complications   . DVT (deep venous thrombosis)     Right internal jugular vein  . CAD (coronary artery disease), native coronary artery 2011    Diffuse distal vessel CAD noted at cardiac catheterization  . MI (myocardial infarction)   . Stroke     Past Surgical History  Procedure Laterality Date  . Av fistula placement Left 2011    upper arm  . Below knee leg amputation Left 2002  . Tonsillectomy    . Cataract extraction w/ intraocular lens implant, bilateral Bilateral 2012-2014  . Esophagogastroduodenoscopy N/A 08/30/2013    Procedure: ESOPHAGOGASTRODUODENOSCOPY (EGD); Surgeon: Beryle Beams, MD; Location: [facility] ENDOSCOPY; Service: Endoscopy; Laterality: N/A;  . Below knee leg amputation Left   . Av fistula placement Left     Family History  Problem Relation Age of Onset  . Hypertension Mother   . Hypertension Father   . Heart attack Brother   . Stroke Mother   . Pulmonary embolism Father     Social History: Recently widowed. Is the caregiver for three middle school grandchildren--brother is helping out. She reports that she has never smoked. She does not have any smokeless tobacco history on file. She reports that she does not drink alcohol or use illicit drugs.    Allergies  Allergen Reactions  . Phenergan [Promethazine Hcl] Other (See Comments)    Causes patient to" vomit more"  . Vitamin D Analogs Rash    Medications Prior to Admission  Medication Sig Dispense Refill  . acetaminophen (TYLENOL) 325 MG tablet Take 2  tablets (650 mg total) by mouth every 6 (six) hours as needed for moderate pain.    Marland Kitchen albuterol (PROVENTIL) (2.5 MG/3ML) 0.083% nebulizer solution Take 3 mLs (2.5 mg total) by nebulization every 4 (four) hours as needed for wheezing. 75 mL 12  . Amino Acids-Protein Hydrolys (FEEDING SUPPLEMENT, PRO-STAT SUGAR FREE 64,) LIQD Take 30 mLs by mouth 2 (two) times daily at 10 AM and 5 PM. 900 mL 0  . aspirin 81 MG chewable tablet Chew 1 tablet (81 mg total) by mouth daily.    Marland Kitchen b complex-vitamin c-folic acid (NEPHRO-VITE) 0.8 MG TABS tablet Take 1 tablet by mouth daily.    Marland Kitchen dextromethorphan (DELSYM) 30 MG/5ML liquid Take 5 mLs (30 mg total) by mouth 2 (two) times daily. 89 mL 0  . famotidine (PEPCID) 20 MG tablet Take 1 tablet (20 mg total) by mouth at bedtime. 30 tablet 1  . fluticasone (FLOVENT HFA) 110 MCG/ACT inhaler Inhale 2 puffs into the lungs 2 (two) times daily.    Marland Kitchen glimepiride (AMARYL) 1 MG tablet Take 1 tablet (1 mg total) by mouth daily with breakfast. 30 tablet 1  . guaiFENesin (MUCINEX) 600 MG 12 hr tablet Take 1 tablet (600 mg total) by mouth 2 (two) times daily. 30 tablet 0  . metoprolol (LOPRESSOR) 1 MG/ML injection Inject 2.5 mLs (2.5 mg total) into the  vein every 6 (six) hours. 5 mL   . midodrine (PROAMATINE) 5 MG tablet Take 1 tablet (5 mg total) by mouth every Monday, Wednesday, and Friday with hemodialysis. 30 tablet 1  . multivitamin (RENA-VIT) TABS tablet Take 1 tablet by mouth at bedtime.  0  . Nutritional Supplements (FEEDING SUPPLEMENT, NEPRO CARB STEADY,) LIQD Take 237 mLs by mouth daily.    . pantoprazole (PROTONIX) 40 MG tablet Take 1 tablet (40 mg total) by mouth 2 (two) times daily. 60 tablet 1  . prednisoLONE acetate (PRED FORTE) 1 % ophthalmic suspension Place 1 drop into both eyes 3 (three) times daily. 5 mL 0  . sevelamer carbonate (RENVELA) 800 MG tablet Take 1,600 mg by mouth 3 (three)  times daily with meals.       Home:    Functional History:    Functional Status:  Mobility: Mod assist with bed mobility Max assist mod-max assist with transfers Has difficulty donning prosthesis due to edema.    ADL: Set up assist for grooming using RUE primarily  Working on ROM BUE.  Min assist to don/doffgown.   Cognition:      Physical Exam: Blood pressure 137/53, pulse 70, resp. rate 17, SpO2 97 %. Physical Exam  Nursing note and vitals reviewed. Constitutional: She is oriented to person, place, and time. She appears well-developed and well-nourished. She has a sickly appearance.  HENT:  Head: Normocephalic and atraumatic.  Right Ear: External ear normal.  Left Ear: External ear normal.  Eyes: Conjunctivae are normal. Pupils are equal, round, and reactive to light.  Neck: Normal range of motion. Neck supple. No JVD present. No tracheal deviation present. No thyromegaly present.  Cardiovascular: Normal rate and regular rhythm. Exam reveals friction rub.  No murmur heard. Distant sounds  Respiratory: Effort normal. No respiratory distress. She has decreased breath sounds. She has no wheezes. She has no rales.  GI: Soft. Bowel sounds are normal. She exhibits no distension. There is no tenderness. There is no rebound.  Musculoskeletal:  Right heel boggy and tender. Right shin with 1+edema, stasis changes, scabs and abraded areas. 2+ edema right foot with dry peeling skin. Toes dusky appearing with moist areas in between. L-BKA site with dry adherent scab at distal end of stump. Left biceps area with moderate edema/ ecchymosis due to resolving hematoma--+ thrill. Left arm tender near AVG  Neurological: She is alert and oriented to person, place, and time.  Speech clear. Follow commands without difficulty. LUE limited due to ongoing HD, pain from graft. RUE: 4/5. LE's: HF 2, KE 3-, right ADF/APF 4-/5. Decreased PP/LT distal RLE. DTR's 1+  Skin: Skin is warm  and dry.  Psychiatric: Her speech is normal and behavior is normal. Thought content normal. Her affect is blunt. Cognition and memory are normal.  Occasionally tearful     Lab Results Last 48 Hours    Results for orders placed or performed during the hospital encounter of 07/07/14 (from the past 48 hour(s))  Hemoglobin A1c Status: Abnormal   Collection Time: 07/28/14 6:40 AM  Result Value Ref Range   Hgb A1c MFr Bld 6.3 (H) <5.7 %    Comment: (NOTE)   According to the ADA Clinical Practice Recommendations for 2011, when HbA1c is used as a screening test: >=6.5% Diagnostic of Diabetes Mellitus  (if abnormal result is confirmed) 5.7-6.4% Increased risk of developing Diabetes Mellitus References:Diagnosis and Classification of Diabetes Mellitus,Diabetes FWYO,3785,88(FOYDX 1):S62-S69 and Standards of Medical Care in  Diabetes - 2011,Diabetes [phone] (  Suppl 1):S11-S61.    Mean Plasma Glucose 134 (H) <117 mg/dL    Comment: Performed at [facility]  Renal function panel Status: Abnormal   Collection Time: 07/28/14 6:40 AM  Result Value Ref Range   Sodium 138 137 - 147 mEq/L   Potassium 4.9 3.7 - 5.3 mEq/L   Chloride 98 96 - 112 mEq/L   CO2 23 19 - 32 mEq/L   Glucose, Bld 129 (H) 70 - 99 mg/dL   BUN 39 (H) 6 - 23 mg/dL   Creatinine, Ser 4.31 (H) 0.50 - 1.10 mg/dL   Calcium 9.1 8.4 - 10.5 mg/dL   Phosphorus 3.0 2.3 - 4.6 mg/dL   Albumin 2.9 (L) 3.5 - 5.2 g/dL   GFR calc non Af Amer 10 (L) >90 mL/min   GFR calc Af Amer 11 (L) >90 mL/min    Comment: (NOTE) The eGFR has been calculated using the CKD EPI equation. This calculation has not been validated in all clinical situations. eGFR's persistently <90 mL/min signify possible Chronic Kidney Disease.    Anion gap 17 (H) 5 - 15  Renal  function panel Status: Abnormal   Collection Time: 07/29/14 7:19 AM  Result Value Ref Range   Sodium 133 (L) 137 - 147 mEq/L   Potassium 5.1 3.7 - 5.3 mEq/L   Chloride 94 (L) 96 - 112 mEq/L   CO2 22 19 - 32 mEq/L   Glucose, Bld 176 (H) 70 - 99 mg/dL   BUN 56 (H) 6 - 23 mg/dL   Creatinine, Ser 5.45 (H) 0.50 - 1.10 mg/dL   Calcium 9.5 8.4 - 10.5 mg/dL   Phosphorus 3.0 2.3 - 4.6 mg/dL   Albumin 3.2 (L) 3.5 - 5.2 g/dL   GFR calc non Af Amer 7 (L) >90 mL/min   GFR calc Af Amer 8 (L) >90 mL/min    Comment: (NOTE) The eGFR has been calculated using the CKD EPI equation. This calculation has not been validated in all clinical situations. eGFR's persistently <90 mL/min signify possible Chronic Kidney Disease.    Anion gap 17 (H) 5 - 15      Imaging Results (Last 48 hours)    Dg Chest Port 1 View  07/28/2014 CLINICAL DATA: Shortness of breath. EXAM: PORTABLE CHEST - 1 VIEW COMPARISON: 07/12/2014 FINDINGS: Double lumen central venous catheter is in the superior vena cava. There is chronic cardiomegaly. Pulmonary vascularity is normal. No infiltrates or effusions. Old right anterior rib fractures with callus formation. IMPRESSION: No acute abnormality. Chronic cardiomegaly. Electronically Signed By: Rozetta Nunnery M.D. On: 07/28/2014 16:10        Medical Problem List and Plan: 1. Functional deficits secondary to Critical illness myopathy.  2. DVT Prophylaxis/Anticoagulation: Pharmaceutical: Lovenox 3. Pain Management: Prn oxycodone.  4. Mood: LCSW to follow for evaluation and support. Team to provide egosupport as well.  5. Neuropsych: This patient is capable of making decisions on her own behalf. 6. Skin/Wound Care: continue foam dressing to sacrum. Order Prevalon boot for right foot as well as elevation when in bed.  7. Fluids/Electrolytes/Nutrition: Strict I/O. 1200 FR due to ESRD. 8. ESRD: HD after  therapy day. Nephrology to manage dialysis,   -AVG site tender but functioning, no signs of infection, re- check in am 9. CAD: metoprolol, ASA, monitor clinically with functional activities 10. A fib: rate controlled at present. Continue metoprolol  12. DM type II: sliding scale insulin. Check cbg's ac and hs  -on amaryl at home    Post  Admission Physician Evaluation: 1. Functional deficits secondary to critical illness myopathy after cardiac arrest and multiple medical issues/prolonged hospital stay. 2. Patient is admitted to receive collaborative, interdisciplinary care between the physiatrist, rehab nursing staff, and therapy team. 3. Patient's level of medical complexity and substantial therapy needs in context of that medical necessity cannot be provided at a lesser intensity of care such as a SNF. 4. Patient has experienced substantial functional loss from his/her baseline which was documented above under the "Functional History" and "Functional Status" headings. Judging by the patient's diagnosis, physical exam, and functional history, the patient has potential for functional progress which will result in measurable gains while on inpatient rehab. These gains will be of substantial and practical use upon discharge in facilitating mobility and self-care at the household level. 5. Physiatrist will provide 24 hour management of medical needs as well as oversight of the therapy plan/treatment and provide guidance as appropriate regarding the interaction of the two. 6. 24 hour rehab nursing will assist with bladder management, bowel management, safety, skin/wound care, disease management, medication administration, pain management and patient education and help integrate therapy concepts, techniques,education, etc. 7. PT will assess and treat for/with: Lower extremity strength, range of motion, stamina, balance, functional mobility, safety, adaptive techniques and equipment, NMR, pain mgt,  prosthetic mgt, skin care, pt/family education. Goals are: supervision to min assist. 8. OT will assess and treat for/with: ADL's, functional mobility, safety, upper extremity strength, adaptive techniques and equipment, NMR, pain mgt, prosthetic manipulation, community reintegration, education. Goals are: supervision to min assist. Therapy may not yet proceed with showering this patient. 9. SLP will assess and treat for/with: n/a. Goals are: n/a---pt near baseline. 10. Case Management and Social Worker will assess and treat for psychological issues and discharge planning. 11. Team conference will be held weekly to assess progress toward goals and to determine barriers to discharge. 12. Patient will receive at least 3 hours of therapy per day at least 5 days per week. 13. ELOS: 15-22 days  14. Prognosis: good     Meredith Staggers, MD, [facility] 07/29/2014

## 2014-07-30 ENCOUNTER — Inpatient Hospital Stay (HOSPITAL_COMMUNITY): Payer: BC Managed Care – PPO | Admitting: Occupational Therapy

## 2014-07-30 ENCOUNTER — Inpatient Hospital Stay (HOSPITAL_COMMUNITY): Payer: Medicare Other | Admitting: Physical Therapy

## 2014-07-30 DIAGNOSIS — G7281 Critical illness myopathy: Principal | ICD-10-CM

## 2014-07-30 DIAGNOSIS — N186 End stage renal disease: Secondary | ICD-10-CM

## 2014-07-30 DIAGNOSIS — Z992 Dependence on renal dialysis: Secondary | ICD-10-CM

## 2014-07-30 DIAGNOSIS — E1322 Other specified diabetes mellitus with diabetic chronic kidney disease: Secondary | ICD-10-CM

## 2014-07-30 LAB — GLUCOSE, CAPILLARY
GLUCOSE-CAPILLARY: 156 mg/dL — AB (ref 70–99)
GLUCOSE-CAPILLARY: 230 mg/dL — AB (ref 70–99)
GLUCOSE-CAPILLARY: 183 mg/dL — AB (ref 70–99)
Glucose-Capillary: 202 mg/dL — ABNORMAL HIGH (ref 70–99)
Glucose-Capillary: 211 mg/dL — ABNORMAL HIGH (ref 70–99)

## 2014-07-30 NOTE — Plan of Care (Signed)
Problem: RH Wheelchair Mobility Goal: LTG Patient will propel w/c in community environment (PT) LTG: Patient will propel wheelchair in community environment, # of feet with assist (PT) Error - disregard this goal

## 2014-07-30 NOTE — Evaluation (Addendum)
Speech Language Pathology Assessment and Plan  Patient Details  Name: Briana Gould MRN: [id number] Date of Birth: [birth year]  SLP Diagnosis: Dysphagia;Other (comment) (Cognition to be assessed at next available appointment)  Rehab Potential: Good ELOS: 14-17 days     Today's Date: 07/30/2014 SLP Individual Time: 9373-4287 SLP Individual Time Calculation (min): 30 min   Problem List:  Patient Active Problem List   Diagnosis Date Noted  . Critical illness myopathy 07/29/2014  . HCAP (healthcare-associated pneumonia) 12/12/2013  . Physical deconditioning 10/15/2013  . CVA (cerebral infarction) 10/01/2013  . Paroxysmal a-fib 10/01/2013  . Cardiac arrest 09/20/2013  . Acute respiratory failure with hypoxia 09/20/2013  . Hypertension 09/20/2013  . Encephalopathy acute 09/20/2013  . Bradycardia- intol to beta blocker 09/03/2013  . Anemia of chronic renal failure 08/30/2013  . Secondary hyperparathyroidism 08/30/2013  . GIB (gastrointestinal bleeding)--recurrent- not a candidate for Plavix 08/29/2013  . CAD- severe 3V CAD at cath March 2015- medical Rx 08/16/2013  . History of syncope 08/16/2013  . Aortic stenosis 08/16/2013  . 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 Cerebrovascular disease 08/15/2013  . Thrombocytopenia 08/14/2013  . Seizure 08/13/2013  . PAD (peripheral artery disease) 08/13/2013  . Weakness generalized 11/08/2011  . ESRD (end stage renal disease) on dialysis 11/04/2011  . MITRAL REGURGITATION 10/04/2010  . AORTIC STENOSIS 10/04/2010  . 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 HYPERLIPIDEMIA 09/13/2010  . OBESITY 09/13/2010  . Essential hypertension 09/13/2010  . PULMONARY HYPERTENSION 09/13/2010  . Status post below knee amputation 09/13/2010   Past Medical History:  Past Medical History  Diagnosis Date  . PAD (peripheral artery disease)     a. L BKA  . Type II diabetes mellitus   . Anemia   . History of blood transfusion 2002     "related to amputation" (08/13/2013)  . End stage renal disease on dialysis     "[facility]; M, W, F" (08/13/2013)  . DVT (deep venous thrombosis)     a. R IJ DVT 08/2013.  Marland Kitchen CVA (cerebral infarction)     a. MRI 08/2013: possible left motor strip infarct with remote pontine infarct.  . Pulmonary HTN     a. Cath 2011 - mod to severe pulmonary hypertension. b. Echo 08/2013: PAP 60mHg (mild-mod increased).  . Thrombocytopenia   . CAD (coronary artery disease)     a. Severe diffuse diabetic disease by cath 2011, for med rx, not a candidate for CABG or PCI at that time.  . Aortic stenosis     mild  . Syncope     a. 2011, etiology never clear.  . Carotid artery disease     a. Dopp 168/1157 12-62%LICA< 603-55%RICA borderline >80%.  . Cerebrovascular disease     a. 08/2013 MRA brain: multiple bilateral moderate and high grade intracranial stenoses  . Cardiac arrest 08/14/13    a. Possible arrest in setting of hypotension during dialysis with possible arrest requiring brief CPR, not intubated and was fully intact after episode. Suspected due to low cerebral perfusion in setting of hypotension. Unclear if any arrhythmia.  . End stage renal disease on dialysis   . Peripheral arterial occlusive disease   . Hypertension   . Diabetes mellitus type 2, uncontrolled, with complications   . DVT (deep venous thrombosis)      Right internal jugular vein  . CAD (coronary artery disease), native coronary artery 2011    Diffuse distal vessel CAD noted at cardiac  catheterization  . MI (myocardial infarction)   . Stroke    Past Surgical History:  Past Surgical History  Procedure Laterality Date  . Av fistula placement Left 2011    upper arm  . Below knee leg amputation Left 2002  . Tonsillectomy    . Cataract extraction w/ intraocular lens  implant, bilateral Bilateral 2012-2014  . Esophagogastroduodenoscopy N/A 08/30/2013    Procedure: ESOPHAGOGASTRODUODENOSCOPY (EGD);  Surgeon:  Beryle Beams, MD;  Location: [facility] ENDOSCOPY;  Service: Endoscopy;  Laterality: N/A;  . Below knee leg amputation Left   . Av fistula placement Left     Assessment / Plan / Recommendation Clinical Impression  68 year old female with h/o severe multivessel CAD (not candidate for percutaneous intervention), ESRD on HD, and PAD s/p R- BKA, h/o prior cardiac arrest X 2; who was admitted to on 06/27/2014 due to cardiopulmonary arrest past having HD cath placed at outpatient center. She received CPR for 2 minutes with return of spontaneous circulation and was unresponsive w/ ineffective ventilation and posturing on arrival to ED. She was placed under the hypothermia protocol. On 06/29/2014 she was rewarmed and extubated but remained lethargic and nonverbal with concerns of anoxic encephalopathy. CT head negative. EEG done showing diffuse cerebral disturbance--non-specific. CXR on 10/22 with LLL HCAP and patient has completed her antibiotic course.  Swallow evaluation with evidence of dysphagia and patient placed on D3 Nectar liquids with compensatory strategies. She was noted to be significantly deconditioned and was transferred to [facility] on 10/29.  Endurance improving and diet advanced to regular thin with chin tuck on 11/17. Therapy ongoing and patient showing increased tolerance. MD and therapy team recommending CIR for follow up therapy.  Pt admitted to CIR on 07/29/2014.  SLP bedside evaluation completed on 07/30/2014 with the following results: Pt presents with s/s of good airway protection with all consistencies assessed including thin liquids, purees, and solid cracker consistencies.  Pt with x1 instance of strong reflexive cough with larger cup sips; however, no further s/s of aspiration noted with small controlled cup sips and use of chin tuck.  Recommend that pt continue on regular solids and thin liquids with full supervision for use of swallowing precautions (i.e. Slow rate, small bites/sips, chin tuck).   Pt would benefit from a cognitive-linguistic evaluation at next available appointment as she presents with ?deficits and no family was present to determine baseline level of function.    Skilled Therapeutic Interventions          Bedside swallowing evaluation completed with results and recommendations reviewed with patient.  .      SLP Assessment  Patient will need skilled Speech Lanaguage Pathology Services during CIR admission    Recommendations  Diet Recommendations: Regular;Thin liquid Liquid Administration via: Cup Medication Administration: Whole meds with liquid Supervision: Full supervision/cueing for compensatory strategies;Patient able to self feed Compensations: Slow rate;Small sips/bites Postural Changes and/or Swallow Maneuvers: Seated upright 90 degrees;Chin tuck;Out of bed for meals Oral Care Recommendations: Oral care BID Patient destination:  (TBD) Follow up Recommendations:  (TBD) Equipment Recommended: None recommended by SLP    SLP Frequency 5 out of 7 days   SLP Treatment/Interventions Dysphagia/aspiration precaution training;Environmental controls;Internal/external aids    Pain Pain Assessment Pain Assessment: No/denies pain Prior Functioning Cognitive/Linguistic Baseline:  (to be assessed further at cognitive evaluation ) Type of Home: House  Lives With: Family Available Help at Discharge: Family;Available 24 hours/day Vocation: Retired  Industrial/product designer Term Goals: Week 1: SLP Short Term Goal 1 (Week 1):  Pt will tolerate her currently prescribed diet with no overt s/s of aspiration and supervision cues for use of swallowing precautions.  SLP Short Term Goal 2 (Week 1): Pt will participate in a cognitive-linguistic evaluation to determine presence of acute changes s/p acute hospitalization   See FIM for current functional status Refer to Care Plan for Long Term Goals  Recommendations for other services: None  Discharge Criteria: Patient will be discharged from  SLP if patient refuses treatment 3 consecutive times without medical reason, if treatment goals not met, if there is a change in medical status, if patient makes no progress towards goals or if patient is discharged from hospital.  The above assessment, treatment plan, treatment alternatives and goals were discussed and mutually agreed upon: by patient   Windell Moulding, M.A. CCC-SLP  Deshundra Waller, Selinda Orion 07/30/2014, 4:38 PM

## 2014-07-30 NOTE — Progress Notes (Signed)
Subjective: Patient denies any complaints. She is sitting up in bed getting ready for therapy.  Objective:BP 141/62 mmHg  Pulse 78  Temp(Src) 98.8 F (37.1 C) (Oral)  Resp 17  Wt 190 lb 4.1 oz (86.3 kg)  SpO2 100%  Elderly female in no acute distress. Chest clear to auscultation. Cardiac exam S1 and S2 are irregular. Abdominal exam active bowel sounds, soft. Extremities no edema of the right leg. She has had a left BKA.  Assessment and plan:  1. Functional deficits secondary to Critical illness myopathy. Status post cardiac arrest. Status post CPR for 2 minutes. 2. DVT Prophylaxis/Anticoagulation: Pharmaceutical: Lovenox 3. Pain Management: Prn oxycodone.  4. Mood: LCSW to follow for evaluation and support. Team to provide egosupport as well.  5. Neuropsych: This patient is capable of making decisions on her own behalf. 6. Skin/Wound Care: continue foam dressing to sacrum. Order Prevalon boot for right foot as well as elevation when in bed.  7. Fluids/Electrolytes/Nutrition: Strict I/O. 1200 FR due to ESRD. 8. ESRD: HD after therapy day. Nephrology to manage dialysis,  -AVG site tender but functioning, no signs of infection, re- check in am 9. CAD: metoprolol, ASA, monitor clinically with functional activities. Ejection fraction in the 45-50% range 10. A fib: rate controlled at present. Continue metoprolol  12. DM type II: sliding scale insulin. Check cbg's ac and hs -on amaryl at home CBG (last 3)   Recent Labs  07/29/14 1800 07/29/14 2050 07/30/14 0651  GLUCAP 233* 143* 156*

## 2014-07-30 NOTE — Evaluation (Signed)
Physical Therapy Assessment and Plan  Patient Details  Name: Briana Gould MRN: [id number] Date of Birth: 1946-01-01  PT Diagnosis: Abnormal posture, Cognitive deficits, Coordination disorder, Difficulty walking, Edema, Impaired cognition, Impaired sensation, Muscle weakness and Pain in  L arm at fistula site Rehab Potential: Fair ELOS: 14-17 days   Today's Date: 07/30/2014 PT Individual Time: 800-900: 60 min    Problem List:  Patient Active Problem List   Diagnosis Date Noted  . Critical illness myopathy 07/29/2014  . HCAP (healthcare-associated pneumonia) 12/12/2013  . Physical deconditioning 10/15/2013  . CVA (cerebral infarction) 10/01/2013  . Paroxysmal a-fib 10/01/2013  . Cardiac arrest 09/20/2013  . Acute respiratory failure with hypoxia 09/20/2013  . Hypertension 09/20/2013  . Encephalopathy acute 09/20/2013  . Bradycardia- intol to beta blocker 09/03/2013  . Anemia of chronic renal failure 08/30/2013  . Secondary hyperparathyroidism 08/30/2013  . GIB (gastrointestinal bleeding)--recurrent- not a candidate for Plavix 08/29/2013  . CAD- severe 3V CAD at cath March 2015- medical Rx 08/16/2013  . History of syncope 08/16/2013  . Aortic stenosis 08/16/2013  . 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 Cerebrovascular disease 08/15/2013  . Thrombocytopenia 08/14/2013  . Seizure 08/13/2013  . PAD (peripheral artery disease) 08/13/2013  . Weakness generalized 11/08/2011  . ESRD (end stage renal disease) on dialysis 11/04/2011  . MITRAL REGURGITATION 10/04/2010  . AORTIC STENOSIS 10/04/2010  . 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 HYPERLIPIDEMIA 09/13/2010  . OBESITY 09/13/2010  . Essential hypertension 09/13/2010  . PULMONARY HYPERTENSION 09/13/2010  . Status post below knee amputation 09/13/2010    Past Medical History:  Past Medical History  Diagnosis Date  . PAD (peripheral artery disease)     a. L BKA  . Type II diabetes mellitus   .  Anemia   . History of blood transfusion 2002    "related to amputation" (08/13/2013)  . End stage renal disease on dialysis     "[facility]; M, W, F" (08/13/2013)  . DVT (deep venous thrombosis)     a. R IJ DVT 08/2013.  Marland Kitchen CVA (cerebral infarction)     a. MRI 08/2013: possible left motor strip infarct with remote pontine infarct.  . Pulmonary HTN     a. Cath 2011 - mod to severe pulmonary hypertension. b. Echo 08/2013: PAP 67mHg (mild-mod increased).  . Thrombocytopenia   . CAD (coronary artery disease)     a. Severe diffuse diabetic disease by cath 2011, for med rx, not a candidate for CABG or PCI at that time.  . Aortic stenosis     mild  . Syncope     a. 2011, etiology never clear.  . Carotid artery disease     a. Dopp 186/1683 17-29%LICA< 602-11%RICA borderline >80%.  . Cerebrovascular disease     a. 08/2013 MRA brain: multiple bilateral moderate and high grade intracranial stenoses  . Cardiac arrest 08/14/13    a. Possible arrest in setting of hypotension during dialysis with possible arrest requiring brief CPR, not intubated and was fully intact after episode. Suspected due to low cerebral perfusion in setting of hypotension. Unclear if any arrhythmia.  . End stage renal disease on dialysis   . Peripheral arterial occlusive disease   . Hypertension   . Diabetes mellitus type 2, uncontrolled, with complications   . DVT (deep venous thrombosis)      Right internal jugular vein  . CAD (coronary artery disease), native coronary artery 2011  Diffuse distal vessel CAD noted at cardiac catheterization  . MI (myocardial infarction)   . Stroke    Past Surgical History:  Past Surgical History  Procedure Laterality Date  . Av fistula placement Left 2011    upper arm  . Below knee leg amputation Left 2002  . Tonsillectomy    . Cataract extraction w/ intraocular lens  implant, bilateral Bilateral 2012-2014  . Esophagogastroduodenoscopy N/A 08/30/2013    Procedure:  ESOPHAGOGASTRODUODENOSCOPY (EGD);  Surgeon: Beryle Beams, MD;  Location: [facility] ENDOSCOPY;  Service: Endoscopy;  Laterality: N/A;  . Below knee leg amputation Left   . Av fistula placement Left     Assessment & Plan Clinical Impression: 68 year old female with h/o severe multivessel CAD (not candidate for percutaneous intervention), ESRD on HD, and PAD s/p R- BKA, h/o prior cardiac arrest X 2; who was admitted to on 06/27/2014 due to cardiopulmonary arrest past having HD cath placed at outpatient center. She received CPR for 2 minutes with return of spontaneous circulation and was unresponsive w/ ineffective ventilation and posturing on arrival to ED. She was placed under the hypothermia protocol. 2D echo with EF 45-50% with mild LVF and elevated pulmonary pressures. On 06/29/2014 she was rewarmed and extubated but remained lethargic and nonverbal with concerns of anoxic encephalopathy. CT head negative. EEG done showing diffuse cerebral disturbance--non-specific. CXR on 10/22 with LLL HCAP and patient has completed her antibiotic course. Cardiology consulted for input and recommended medical treatment as EKG and troponin negative. PCCM felt that arrest most likely due to baseline hypotension and pulmonary hypertension. A fib treated with IV Cardizem with recommendations to transition to oral BB with close monitoring as due to patient's h/o hypotension past HD. Swallow evaluation with evidence on dysphagia and patient placed on D3 Nectar liquids with compensatory strategies. She was noted to be significantly deconditioned and was transferred to [facility] on 10/29.   HD has been ongoing on MWF with midodrine and IV albumin to assist with BP support. BB changed to non-dialysis days with HR controlled and ABLA being monitored. L-AVF has infiltrated with hematoma that's slowly resolving. Dialysis catheter exchanged by IVR on 11/9. Stage 2 sacral decub covered with foam dressing and right heel with unstable ulcer.  Endurance improving and diet advanced to regular thin with chin tuck on 11/17. Therapy ongoing and patient showing increased tolerance.  Patient transferred to CIR on 07/29/2014 .   Patient currently requires max with mobility secondary to muscle weakness, decreased cardiorespiratoy endurance, decreased coordination and decreased motor planning, decreased visual acuity, decreased problem solving, decreased memory and delayed processing and decreased sitting balance, decreased standing balance and decreased postural control.  Prior to hospitalization, patient was modified independent  with mobility and lived with Family in a House home.  Home access is  Ramped entrance.  Patient will benefit from skilled PT intervention to maximize safe functional mobility, minimize fall risk and decrease caregiver burden for planned discharge home with intermittent assist.  Anticipate patient will benefit from follow up [facility] at discharge.  PT - End of Session Activity Tolerance: Tolerates < 10 min activity with changes in vital signs (SOB with exertion) Endurance Deficit: Yes Endurance Deficit Description: cardiorespiratory - very limited activity tolerance PT Assessment Rehab Potential (ACUTE/IP ONLY): Fair Barriers to Discharge: Decreased caregiver support PT Patient demonstrates impairments in the following area(s): Balance;Edema;Endurance;Motor;Pain;Safety;Sensory;Skin Integrity PT Transfers Functional Problem(s): Bed Mobility;Bed to Chair;Car;Furniture PT Locomotion Functional Problem(s): Ambulation;Wheelchair Mobility PT Plan PT Intensity: Minimum of 1-2 x/day ,45 to 90  minutes PT Frequency: 5 out of 7 days PT Duration Estimated Length of Stay: 14-17 days PT Treatment/Interventions: Ambulation/gait training;DME/adaptive equipment instruction;Psychosocial support;UE/LE Strength taining/ROM;Balance/vestibular training;Skin care/wound management;UE/LE Coordination activities;Cognitive  remediation/compensation;Functional mobility training;Splinting/orthotics;Visual/perceptual remediation/compensation;Community reintegration;Neuromuscular re-education;Wheelchair propulsion/positioning;Discharge planning;Pain management;Therapeutic Activities;Disease management/prevention;Patient/family education;Therapeutic Exercise PT Transfers Anticipated Outcome(s): supervision PT Locomotion Anticipated Outcome(s): household distances mod I PT Recommendation Follow Up Recommendations: Home health PT Patient destination: Home Equipment Recommended: To be determined Equipment Details: Pt reports her own w/c is over 33 years old and is in poor shape - pt will likely need a new one  Skilled Therapeutic Intervention PT Evaluation: PT evaluation initiated. PT finds pt has very poor activity tolerance, impaired sitting balance, inability to stand during PT session, difficulty with bed mobility and scoot transfer with slideboard. Pt is unable to fit L residual limb into prosthesis due to swelling. Pt will benefit from IPR PT to maximize safety and functional independence.   Therapeutic Activity: PT instructs pt in rolling R and L in bed without rail req min A, R side lie to sit req min A, scoot transfer with SB req max A due to pt unable to squat pivot with 1 person assist. PT instructs pt in sit to stand with RW from w/c and pt is unable to achieve a full stand.   See Eval note above. Continue per PT POC.    PT Evaluation Precautions/Restrictions Precautions Precautions: Fall Restrictions Weight Bearing Restrictions: No Other Position/Activity Restrictions: old L BKA General Chart Reviewed: Yes Family/Caregiver Present: No Vital SignsTherapy Vitals Pulse Rate: 78 BP: (!) 141/62 mmHg Patient Position (if appropriate): Sitting Oxygen Therapy SpO2: 100 % O2 Device: Not Delivered Pain Pain Assessment Pain Assessment: No/denies pain Home Living/Prior Functioning Home Living Available  Help at Discharge: Family;Available 24 hours/day Type of Home: House Home Access: Ramped entrance Home Layout: One level  Lives With: Family Prior Function Level of Independence: Requires assistive device for independence;Needs assistance with ADLs  Able to Take Stairs?: No Driving: No Vocation: Retired Comments: Pt does not urinate due to dialysis, uses toilet for BM and shower chair for showers Vision/Perception  Vision - Assessment Additional Comments: L preference, but able tot urn her head to R to locate world on R side.   Cognition Overall Cognitive Status: Within Functional Limits for tasks assessed Arousal/Alertness: Awake/alert Orientation Level: Oriented to person;Oriented to place;Oriented to time;Disoriented to situation Attention: Focused Focused Attention: Appears intact Awareness: Impaired Awareness Impairment: Anticipatory impairment Memory: Impaired Problem Solving: Impaired - functional basic, functional complex Comments: slowed processing noted Sensation Sensation Light Touch: Impaired Detail Light Touch Impaired Details: Impaired RLE Stereognosis: Not tested Hot/Cold: Not tested Proprioception: Appears Intact Additional Comments: 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 Coordination Gross Motor Movements are Fluid and Coordinated: Yes Fine Motor Movements are Fluid and Coordinated: Not tested Finger Nose Finger Test: wfl bilaterally - but slowed movement Motor  Motor Motor: Abnormal postural alignment and control Motor - Skilled Clinical Observations: slouched, kyphotic posture edge of bed  Mobility Bed Mobility Bed Mobility: Rolling Right;Rolling Left;Right Sidelying to Sit;Sit to Supine Rolling Right: 4: Min assist Rolling Right Details: Manual facilitation for placement;Verbal cues for technique Rolling Right Details (indicate cue type and reason): hand placement Rolling Left: 4: Min assist Rolling Left Details: Manual facilitation for  placement;Verbal cues for technique Rolling Left Details (indicate cue type and reason): hand placement Right Sidelying to Sit: 4: Min assist Right Sidelying to Sit Details: Manual facilitation for placement;Verbal cues for technique Right  Sidelying to Sit Details (indicate cue type and reason): hand placement Sit to Supine: 3: Mod assist Sit to Supine - Details: Manual facilitation for placement Sit to Supine - Details (indicate cue type and reason): for B LEs Transfers Transfers: Yes Lateral/Scoot Transfers: 2: Max assist;With slide board Lateral/Scoot Transfer Details: Manual facilitation for placement;Verbal cues for technique Lateral/Scoot Transfer Details (indicate cue type and reason): lean forward and hand placement Locomotion  Ambulation Ambulation: No Gait Gait: No Stairs / Additional Locomotion Stairs: No Wheelchair Mobility Wheelchair Mobility: Yes Wheelchair Assistance: 5: Investment banker, operational Details: Verbal cues for Marketing executive: Both upper extremities Wheelchair Parts Management: Needs assistance Distance: 40'  Trunk/Postural Assessment  Cervical Assessment Cervical Assessment: Exceptions to [facility] Cervical AROM Overall Cervical AROM: Deficits;Due to premorid status Overall Cervical AROM Comments: limited extension and retraction Thoracic Assessment Thoracic Assessment: Exceptions to [facility] Thoracic Strength Overall Thoracic Strength: Due to premorbid status Overall Thoracic Strength Comments: limited extension Lumbar Assessment Lumbar Assessment: Exceptions to [facility] Lumbar AROM Overall Lumbar AROM: Deficits;Due to premorid status Overall Lumbar AROM Comments: generally stiff and limited in all directions Postural Control Postural Control: Deficits on evaluation Trunk Control: pt relies heavily on leaning on arms laterally for support eob Postural Limitations: generalized slouched sitting posture  Balance Balance Balance  Assessed: Yes Static Sitting Balance Static Sitting - Balance Support: Bilateral upper extremity supported Static Sitting - Level of Assistance: 5: Stand by assistance Dynamic Sitting Balance Dynamic Sitting - Balance Support: Bilateral upper extremity supported Dynamic Sitting - Level of Assistance: 4: Min assist Dynamic Sitting - Balance Activities: Lateral lean/weight shifting Extremity Assessment  RUE Assessment RUE Assessment: Exceptions to [facility] RUE AROM (degrees) Overall AROM Right Upper Extremity: Deficits;Due to premorbid status RUE Overall AROM Comments: arm flexion limited to 90 degrees RUE Strength RUE Overall Strength: Deficits;Due to premorbid status RUE Overall Strength Comments: arm flexion 3-/5, elbow grossly 3+/5, grip 4/5 LUE Assessment LUE Assessment: Exceptions to [facility] LUE AROM (degrees) Overall AROM Left Upper Extremity: Deficits;Due to premorbid status LUE Overall AROM Comments: arm flexion limited to 90 degrees, elbow movement minimally limited due to soreness in L fistula LUE Strength LUE Overall Strength: Deficits;Due to pain LUE Overall Strength Comments: arm flexion 3-/5, elbow grossly 3+/5, grip 3+/5 RLE Assessment RLE Assessment: Exceptions to [facility] RLE AROM (degrees) Overall AROM Right Lower Extremity: Deficits;Due to premorbid status RLE Overall AROM Comments: ankle ROM grossly limited from neuropathy and swelling, knee wfl, hip flexion limited by weakness RLE Strength RLE Overall Strength: Deficits RLE Overall Strength Comments: hip flexion 3-/5, knee grossly 4/5, ankle DF grossly 4/5 LLE Assessment LLE Assessment: Exceptions to [facility] LLE AROM (degrees) Overall AROM Left Lower Extremity: Deficits;Due to premorbid status LLE Overall AROM Comments: L BKA; knee and hip wfl LLE Strength LLE Overall Strength: Within Functional Limits for tasks assessed LLE Overall Strength Comments: grossly 5/5  FIM:  FIM - Locomotion: Wheelchair Distance: 40'    Refer to Care Plan for Long Term Goals  Recommendations for other services: None  Discharge Criteria: Patient will be discharged from PT if patient refuses treatment 3 consecutive times without medical reason, if treatment goals not met, if there is a change in medical status, if patient makes no progress towards goals or if patient is discharged from hospital.  The above assessment, treatment plan, treatment alternatives and goals were discussed and mutually agreed upon: by patient  [facility] M 07/30/2014, 9:00 AM

## 2014-07-30 NOTE — Evaluation (Signed)
Occupational Therapy Assessment and Plan  Patient Details  Name: Briana Gould MRN: [id number] Date of Birth: Jan 18, 1946  OT Diagnosis: cognitive deficits and muscle weakness (generalized) Rehab Potential: Rehab Potential (ACUTE ONLY): Good ELOS: 14-20days   Today's Date: 07/30/2014 OT Individual Time: 0900-1000 OT Individual Time Calculation (min): 60 min     Problem List:  Patient Active Problem List   Diagnosis Date Noted  . Critical illness myopathy 07/29/2014  . HCAP (healthcare-associated pneumonia) 12/12/2013  . Physical deconditioning 10/15/2013  . CVA (cerebral infarction) 10/01/2013  . Paroxysmal a-fib 10/01/2013  . Cardiac arrest 09/20/2013  . Acute respiratory failure with hypoxia 09/20/2013  . Hypertension 09/20/2013  . Encephalopathy acute 09/20/2013  . Bradycardia- intol to beta blocker 09/03/2013  . Anemia of chronic renal failure 08/30/2013  . Secondary hyperparathyroidism 08/30/2013  . GIB (gastrointestinal bleeding)--recurrent- not a candidate for Plavix 08/29/2013  . CAD- severe 3V CAD at cath March 2015- medical Rx 08/16/2013  . History of syncope 08/16/2013  . Aortic stenosis 08/16/2013  . 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 Cerebrovascular disease 08/15/2013  . Thrombocytopenia 08/14/2013  . Seizure 08/13/2013  . PAD (peripheral artery disease) 08/13/2013  . Weakness generalized 11/08/2011  . ESRD (end stage renal disease) on dialysis 11/04/2011  . MITRAL REGURGITATION 10/04/2010  . AORTIC STENOSIS 10/04/2010  . 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 HYPERLIPIDEMIA 09/13/2010  . OBESITY 09/13/2010  . Essential hypertension 09/13/2010  . PULMONARY HYPERTENSION 09/13/2010  . Status post below knee amputation 09/13/2010    Past Medical History:  Past Medical History  Diagnosis Date  . PAD (peripheral artery disease)     a. L BKA  . Type II diabetes mellitus   . Anemia   . History of blood transfusion 2002     "related to amputation" (08/13/2013)  . End stage renal disease on dialysis     "[facility]; M, W, F" (08/13/2013)  . DVT (deep venous thrombosis)     a. R IJ DVT 08/2013.  Marland Kitchen CVA (cerebral infarction)     a. MRI 08/2013: possible left motor strip infarct with remote pontine infarct.  . Pulmonary HTN     a. Cath 2011 - mod to severe pulmonary hypertension. b. Echo 08/2013: PAP 45mHg (mild-mod increased).  . Thrombocytopenia   . CAD (coronary artery disease)     a. Severe diffuse diabetic disease by cath 2011, for med rx, not a candidate for CABG or PCI at that time.  . Aortic stenosis     mild  . Syncope     a. 2011, etiology never clear.  . Carotid artery disease     a. Dopp 127/2536 16-44%LICA< 603-47%RICA borderline >80%.  . Cerebrovascular disease     a. 08/2013 MRA brain: multiple bilateral moderate and high grade intracranial stenoses  . Cardiac arrest 08/14/13    a. Possible arrest in setting of hypotension during dialysis with possible arrest requiring brief CPR, not intubated and was fully intact after episode. Suspected due to low cerebral perfusion in setting of hypotension. Unclear if any arrhythmia.  . End stage renal disease on dialysis   . Peripheral arterial occlusive disease   . Hypertension   . Diabetes mellitus type 2, uncontrolled, with complications   . DVT (deep venous thrombosis)      Right internal jugular vein  . CAD (coronary artery disease), native coronary artery 2011    Diffuse distal vessel CAD noted at cardiac catheterization  .  MI (myocardial infarction)   . Stroke    Past Surgical History:  Past Surgical History  Procedure Laterality Date  . Av fistula placement Left 2011    upper arm  . Below knee leg amputation Left 2002  . Tonsillectomy    . Cataract extraction w/ intraocular lens  implant, bilateral Bilateral 2012-2014  . Esophagogastroduodenoscopy N/A 08/30/2013    Procedure: ESOPHAGOGASTRODUODENOSCOPY (EGD);  Surgeon: Beryle Beams, MD;  Location: [facility] ENDOSCOPY;  Service: Endoscopy;  Laterality: N/A;  . Below knee leg amputation Left   . Av fistula placement Left     Assessment & Plan Clinical Impression: Patient is a 68 y.o. year old female with h/o severe multivessel CAD (not candidate for percutaneous intervention), ESRD on HD, and PAD s/p R- BKA, h/o prior cardiac arrest X 2; who was admitted to on 06/27/2014 due to cardiopulmonary arrest past having HD cath placed at outpatient center. She received CPR for 2 minutes with return of spontaneous circulation and was unresponsive w/ ineffective ventilation and posturing on arrival to ED. She was placed under the hypothermia protocol. 2D echo with EF 45-50% with mild LVF and elevated pulmonary pressures. On 06/29/2014 she was rewarmed and extubated but remained lethargic and nonverbal with concerns of anoxic encephalopathy. CT head negative. EEG done showing diffuse cerebral disturbance--non-specific. CXR on 10/22 with LLL HCAP and patient has completed her antibiotic course. Cardiology consulted for input and recommended medical treatment as EKG and troponin negative. PCCM felt that arrest most likely due to baseline hypotension and pulmonary hypertension. A fib treated with IV Cardizem with recommendations to transition to oral BB with close monitoring as due to patient's h/o hypotension past HD. Swallow evaluation with evidence on dysphagia and patient placed on D3 Nectar liquids with compensatory strategies. She was noted to be significantly deconditioned and was transferred to [facility] on 10/29.   HD has been ongoing on MWF with midodrine and IV albumin to assist with BP support. BB changed to non-dialysis days with HR controlled and ABLA being monitored. L-AVF has infiltrated with hematoma that's slowly resolving. Dialysis catheter exchanged by IVR on 11/9. Stage 2 sacral decub covered with foam dressing and right heel with unstable ulcer. Endurance improving and diet advanced  to regular thin with chin tuck on 11/17.   Patient transferred to CIR on 07/29/2014 .    Patient currently requires max with basic self-care skills and max A +2 for basic mobililty  secondary to muscle weakness and left UE discomfort from fistula, decreased cardiorespiratoy endurance, decreased initiation, decreased attention, decreased awareness, decreased problem solving, decreased safety awareness, decreased memory and delayed processing and decreased sitting balance, decreased standing balance, decreased postural control, decreased balance strategies and difficulty maintaining precautions.  Prior to hospitalization, patient could complete ADL with modified independent .  Patient will benefit from skilled intervention to decrease level of assist with basic self-care skills and increase independence with basic self-care skills prior to discharge home with care partner.  Anticipate patient will require intermittent supervision and minimal physical assistance and follow up home health.  OT - End of Session Activity Tolerance: Tolerates 10 - 20 min activity with multiple rests Endurance Deficit: Yes Endurance Deficit Description: cardiorespiratory - very limited activity tolerance and endurance for self care tasks  OT Assessment Rehab Potential (ACUTE ONLY): Good OT Patient demonstrates impairments in the following area(s): Balance;Cognition;Endurance;Motor;Pain;Safety;Skin Integrity OT Basic ADL's Functional Problem(s): Grooming;Bathing;Dressing;Toileting OT Transfers Functional Problem(s): Toilet;Tub/Shower OT Additional Impairment(s): Fuctional Use of Upper Extremity OT Plan  OT Intensity: Minimum of 1-2 x/day, 45 to 90 minutes OT Frequency: 5 out of 7 days OT Duration/Estimated Length of Stay: 14-20days OT Treatment/Interventions: Balance/vestibular training;Community reintegration;Cognitive remediation/compensation;Discharge planning;Disease mangement/prevention;DME/adaptive equipment  instruction;Psychosocial support;Patient/family education;Therapeutic Exercise;UE/LE Strength taining/ROM;Pain management;Therapeutic Activities;Skin care/wound managment;Functional mobility training;UE/LE Coordination activities;Self Care/advanced ADL retraining;Wheelchair propulsion/positioning OT Self Feeding Anticipated Outcome(s): no goal  OT Basic Self-Care Anticipated Outcome(s): supervision to min A  OT Toileting Anticipated Outcome(s): min A  OT Bathroom Transfers Anticipated Outcome(s): min A  OT Recommendation Patient destination: Home Follow Up Recommendations: Home health OT Equipment Recommended: To be determined   Skilled Therapeutic Intervention OT eval initiated with OT goals, purpose and role discussed with pt. Pt reported needing to void on the toilet. Pt unable to don prosthesis today due to swelling in residual limb. Asked pt for family to bring in shrinker to LE. Focus on setup in prep for transfer in the bathroom, scoot pivot transfer w/c with max A. Pt trouble voiding BM requiring extra time in bathroom. Continued to discuss d/c plans and home setup.  Pt requires extra time to process information and has difficulty providing history. Pt wanted to attempting standing to return to the w/c with the RW but unable to complete. However was able to achieve bottom clearance with max A to return into w/c with 2nd person present for safety. Due to time constraints unable to complete bathing and dressing.  2nd session 14:00-15:00 No c/o pain just fatigue 1:1 Pt in bed when arrived. Focus on bed mobility without bed rail to come to EOB - required min to mod A for pivoting hips with bed pad.  AT EOB participated in therapeutic bathing and dressing. Pt able to wash with cues for sequence and thoroughness. Attempted to perform sit to stand from EOB with bed elevated (without prosthesis donned) but able to accomplish.  Pt required numerous rest breaks throughout session with labored breathing  at times.  Pt required total A to don LB clothing and performed rolling with total A to pull up over hips. Pt wanted to rest in bed after session. PT left with family.   .  OT Evaluation Precautions/Restrictions  Precautions Precautions: Fall Precaution Comments: old left BKA Restrictions Weight Bearing Restrictions: No Other Position/Activity Restrictions: old L BKA General Chart Reviewed: Yes Family/Caregiver Present: No Vital Signs Therapy Vitals Pulse Rate: 78 BP: (!) 141/62 mmHg Patient Position (if appropriate): Sitting Oxygen Therapy SpO2: 98 % O2 Device: Not Delivered Pain Pain Assessment Pain Assessment: No/denies pain Home Living/Prior Functioning Home Living Available Help at Discharge: Family, Available 24 hours/day Type of Home: House Home Access: Ramped entrance Home Layout: One level  Lives With: Family Prior Function Level of Independence: Requires assistive device for independence, Needs assistance with ADLs  Able to Take Stairs?: No Driving: No Vocation: Retired Comments: Pt does not urinate due to dialysis, uses toilet for BM and shower chair for showers ADL ADL ADL Comments: see FIM Vision/Perception  Vision- History Baseline Vision/History: Wears glasses Wears Glasses: At all times Patient Visual Report: Blurring of vision (new blurred vision in L eye) Vision- Assessment Vision Assessment?: Vision impaired- to be further tested in functional context Additional Comments: L preference, but able tot urn her head to R to locate world on R side.   Cognition Overall Cognitive Status: No family/caregiver present to determine baseline cognitive functioning Arousal/Alertness: Awake/alert Orientation Level: Oriented to person;Oriented to place;Oriented to time;Disoriented to situation Attention: Sustained Focused Attention: Appears intact Sustained Attention: Appears intact Memory: Impaired Memory  Impairment: Decreased recall of new  information Awareness: Impaired Awareness Impairment: Emergent impairment;Anticipatory impairment Problem Solving: Impaired Executive Function: Organizing;Initiating Organizing: Impaired Organizing Impairment: Functional basic Initiating: Impaired Initiating Impairment: Functional basic Safety/Judgment: Appears intact Comments: slowed processing noted Sensation Sensation Light Touch: Impaired Detail Light Touch Impaired Details: Impaired RLE Stereognosis: Not tested Hot/Cold: Not tested Proprioception: Appears Intact Additional Comments: 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 Coordination Gross Motor Movements are Fluid and Coordinated: Yes Fine Motor Movements are Fluid and Coordinated: Yes Finger Nose Finger Test: wfl bilaterally - but slowed movement Motor  Motor Motor: Abnormal postural alignment and control Motor - Skilled Clinical Observations: slouched, kyphotic posture edge of bed, signifitcant generalized weakness Mobility  Bed Mobility Bed Mobility: Rolling Right;Rolling Left;Right Sidelying to Sit;Sit to Supine Rolling Right: 4: Min assist Rolling Right Details: Manual facilitation for placement;Verbal cues for technique Rolling Right Details (indicate cue type and reason): hand placement Rolling Left: 4: Min assist Rolling Left Details: Manual facilitation for placement;Verbal cues for technique Rolling Left Details (indicate cue type and reason): hand placement Right Sidelying to Sit: 4: Min assist Right Sidelying to Sit Details: Manual facilitation for placement;Verbal cues for technique Right Sidelying to Sit Details (indicate cue type and reason): hand placement Sit to Supine: 3: Mod assist Sit to Supine - Details: Manual facilitation for placement Sit to Supine - Details (indicate cue type and reason): for B LEs  Trunk/Postural Assessment  Cervical Assessment Cervical Assessment: Exceptions to [facility] Cervical AROM Overall Cervical AROM:  Deficits;Due to premorid status Overall Cervical AROM Comments: limited extension and retraction Thoracic Assessment Thoracic Assessment: Exceptions to [facility] Thoracic Strength Overall Thoracic Strength: Due to premorbid status Overall Thoracic Strength Comments: limited extension Lumbar Assessment Lumbar Assessment: Exceptions to [facility] Lumbar AROM Overall Lumbar AROM: Deficits;Due to premorid status Overall Lumbar AROM Comments: generally stiff and limited in all directions Postural Control Postural Control: Deficits on evaluation Trunk Control: pt relies heavily on leaning on arms laterally for support eob Postural Limitations: generalized slouched sitting posture  Balance Balance Balance Assessed: Yes Static Sitting Balance Static Sitting - Balance Support: Bilateral upper extremity supported Static Sitting - Level of Assistance: 5: Stand by assistance Dynamic Sitting Balance Dynamic Sitting - Balance Support: During functional activity Dynamic Sitting - Level of Assistance: 5: Stand by assistance;4: Min assist Dynamic Sitting - Balance Activities: Lateral lean/weight shifting Sitting balance - Comments: sitting on the toilet Extremity/Trunk Assessment RUE Assessment RUE Assessment: Exceptions to [facility] RUE AROM (degrees) Overall AROM Right Upper Extremity: Deficits;Due to premorbid status RUE Overall AROM Comments: arm flexion limited to 90 degrees RUE Strength RUE Overall Strength: Deficits;Due to premorbid status RUE Overall Strength Comments: arm flexion 3-/5, elbow grossly 3+/5, grip 4/5 LUE Assessment LUE Assessment: Exceptions to WFL LUE AROM (degrees) Overall AROM Left Upper Extremity: Deficits;Due to premorbid status LUE Overall AROM Comments: arm flexion limited to 90 degrees, elbow movement minimally limited due to soreness in L fistula LUE Strength LUE Overall Strength: Deficits;Due to pain LUE Overall Strength Comments: arm flexion 3-/5, elbow grossly 3+/5, grip  3+/5  FIM:  FIM - Eating Eating Activity: 5: Supervision/cues FIM - Grooming Grooming Steps: Wash, rinse, dry face;Wash, rinse, dry hands Grooming: 5: Set-up assist to obtain items FIM - Radio producer Devices: Grab bars Toilet Transfers: 2-To toilet/BSC: Max A (lift and lower assist) FIM - Tub/Shower Transfers Tub/shower Transfers: 0-Activity did not occur or was simulated (not recommended at this time)   Refer to Care  Plan for Long Term Goals  Recommendations for other services: None  Discharge Criteria: Patient will be discharged from OT if patient refuses treatment 3 consecutive times without medical reason, if treatment goals not met, if there is a change in medical status, if patient makes no progress towards goals or if patient is discharged from hospital.  The above assessment, treatment plan, treatment alternatives and goals were discussed and mutually agreed upon: by patient  Nicoletta Ba 07/30/2014, 10:23 AM

## 2014-07-30 NOTE — Plan of Care (Signed)
Problem: RH BOWEL ELIMINATION Goal: RH STG MANAGE BOWEL WITH ASSISTANCE STG Manage Bowel with min Assistance.  Outcome: Progressing Goal: RH STG MANAGE BOWEL W/MEDICATION W/ASSISTANCE STG Manage Bowel with Medication with min Assistance.  Outcome: Progressing  Problem: RH SAFETY Goal: RH STG ADHERE TO SAFETY PRECAUTIONS W/ASSISTANCE/DEVICE STG Adhere to Safety Precautions With min Assistance/Device.  Outcome: Progressing Goal: RH STG DECREASED RISK OF FALL WITH ASSISTANCE STG Decreased Risk of Fall With min Assistance.  Outcome: Progressing  Problem: RH PAIN MANAGEMENT Goal: RH STG PAIN MANAGED AT OR BELOW PT'S PAIN GOAL Less than 3 out of 10 Outcome: Progressing

## 2014-07-31 ENCOUNTER — Inpatient Hospital Stay (HOSPITAL_COMMUNITY): Payer: BC Managed Care – PPO | Admitting: Physical Therapy

## 2014-07-31 ENCOUNTER — Inpatient Hospital Stay (HOSPITAL_COMMUNITY): Payer: Medicare Other

## 2014-07-31 ENCOUNTER — Inpatient Hospital Stay (HOSPITAL_COMMUNITY): Payer: BC Managed Care – PPO

## 2014-07-31 DIAGNOSIS — I10 Essential (primary) hypertension: Secondary | ICD-10-CM

## 2014-07-31 LAB — CBC
HEMATOCRIT: 25.7 % — AB (ref 36.0–46.0)
HEMOGLOBIN: 8.1 g/dL — AB (ref 12.0–15.0)
MCH: 30.9 pg (ref 26.0–34.0)
MCHC: 31.5 g/dL (ref 30.0–36.0)
MCV: 98.1 fL (ref 78.0–100.0)
Platelets: 208 10*3/uL (ref 150–400)
RBC: 2.62 MIL/uL — AB (ref 3.87–5.11)
RDW: 19.7 % — ABNORMAL HIGH (ref 11.5–15.5)
WBC: 9.9 10*3/uL (ref 4.0–10.5)

## 2014-07-31 LAB — RENAL FUNCTION PANEL
ANION GAP: 18 — AB (ref 5–15)
Albumin: 3.1 g/dL — ABNORMAL LOW (ref 3.5–5.2)
BUN: 58 mg/dL — AB (ref 6–23)
CO2: 24 meq/L (ref 19–32)
Calcium: 9.2 mg/dL (ref 8.4–10.5)
Chloride: 91 mEq/L — ABNORMAL LOW (ref 96–112)
Creatinine, Ser: 5.44 mg/dL — ABNORMAL HIGH (ref 0.50–1.10)
GFR calc Af Amer: 8 mL/min — ABNORMAL LOW (ref >90)
GFR calc non Af Amer: 7 mL/min — ABNORMAL LOW (ref >90)
GLUCOSE: 226 mg/dL — AB (ref 70–99)
POTASSIUM: 4.6 meq/L (ref 3.7–5.3)
Phosphorus: 2.9 mg/dL (ref 2.3–4.6)
SODIUM: 133 meq/L — AB (ref 137–147)

## 2014-07-31 LAB — GLUCOSE, CAPILLARY
Glucose-Capillary: 175 mg/dL — ABNORMAL HIGH (ref 70–99)
Glucose-Capillary: 208 mg/dL — ABNORMAL HIGH (ref 70–99)
Glucose-Capillary: 104 mg/dL — ABNORMAL HIGH (ref 70–99)

## 2014-07-31 MED ORDER — MIDODRINE HCL 5 MG PO TABS
ORAL_TABLET | ORAL | Status: AC
  Filled 2014-07-31: qty 1

## 2014-07-31 MED ORDER — SODIUM CHLORIDE 0.9 % IV SOLN
62.5000 mg | INTRAVENOUS | Status: DC
  Filled 2014-07-31: qty 5

## 2014-07-31 MED ORDER — DOXERCALCIFEROL 4 MCG/2ML IV SOLN
5.0000 ug | INTRAVENOUS | Status: DC
  Administered 2014-08-02 – 2014-08-19 (×6): 5 ug via INTRAVENOUS
  Filled 2014-07-31 (×9): qty 4

## 2014-07-31 MED ORDER — MIDODRINE HCL 5 MG PO TABS
5.0000 mg | ORAL_TABLET | Freq: Once | ORAL | Status: AC
  Administered 2014-07-31: 5 mg via ORAL
  Filled 2014-07-31: qty 1

## 2014-07-31 MED ORDER — INSULIN ASPART 100 UNIT/ML ~~LOC~~ SOLN
0.0000 [IU] | Freq: Three times a day (TID) | SUBCUTANEOUS | Status: DC
  Administered 2014-07-31: 5 [IU] via SUBCUTANEOUS
  Administered 2014-08-01: 3 [IU] via SUBCUTANEOUS
  Administered 2014-08-01 – 2014-08-02 (×3): 5 [IU] via SUBCUTANEOUS
  Administered 2014-08-03 – 2014-08-05 (×5): 3 [IU] via SUBCUTANEOUS
  Administered 2014-08-05: 2 [IU] via SUBCUTANEOUS
  Administered 2014-08-06: 5 [IU] via SUBCUTANEOUS
  Administered 2014-08-06: 3 [IU] via SUBCUTANEOUS
  Administered 2014-08-07: 2 [IU] via SUBCUTANEOUS
  Administered 2014-08-07 – 2014-08-08 (×2): 3 [IU] via SUBCUTANEOUS
  Administered 2014-08-08: 8 [IU] via SUBCUTANEOUS
  Administered 2014-08-08: 3 [IU] via SUBCUTANEOUS
  Administered 2014-08-09 – 2014-08-10 (×2): 5 [IU] via SUBCUTANEOUS
  Administered 2014-08-10: 3 [IU] via SUBCUTANEOUS
  Administered 2014-08-11: 5 [IU] via SUBCUTANEOUS
  Administered 2014-08-12: 8 [IU] via SUBCUTANEOUS
  Administered 2014-08-12 – 2014-08-13 (×2): 2 [IU] via SUBCUTANEOUS
  Administered 2014-08-13: 5 [IU] via SUBCUTANEOUS
  Administered 2014-08-13: 3 [IU] via SUBCUTANEOUS
  Administered 2014-08-14: 2 [IU] via SUBCUTANEOUS
  Administered 2014-08-14: 5 [IU] via SUBCUTANEOUS
  Administered 2014-08-15: 2 [IU] via SUBCUTANEOUS
  Administered 2014-08-15: 3 [IU] via SUBCUTANEOUS
  Administered 2014-08-16: 2 [IU] via SUBCUTANEOUS
  Administered 2014-08-16: 8 [IU] via SUBCUTANEOUS
  Administered 2014-08-17: 3 [IU] via SUBCUTANEOUS
  Administered 2014-08-18: 5 [IU] via SUBCUTANEOUS

## 2014-07-31 NOTE — Progress Notes (Signed)
Subjective: Patient is feeling well. She is sitting up in bed.  Objective:BP 119/51 mmHg  Pulse 69  Temp(Src) 99.1 F (37.3 C) (Oral)  Resp 16  Wt 190 lb 14.7 oz (86.6 kg)  SpO2 100%  Elderly female in no acute distress. Chest clear to auscultation. Cardiac exam S1 and S2 are irregular. Abdominal exam active bowel sounds, soft. Extremities no edema of the right leg. She has had a left BKA.  Assessment and plan:  1. Functional deficits secondary to Critical illness myopathy. Status post cardiac arrest. Status post CPR for 2 minutes. 2. DVT Prophylaxis/Anticoagulation: Pharmaceutical: Lovenox 3. Pain Management: Prn oxycodone.  4. Mood: LCSW to follow for evaluation and support. Team to provide egosupport as well.  5. Neuropsych: This patient is capable of making decisions on her own behalf. 6. Skin/Wound Care: continue foam dressing to sacrum. Order Prevalon boot for right foot as well as elevation when in bed.  7. Fluids/Electrolytes/Nutrition: Strict I/O. 1200 FR due to ESRD. 8. ESRD: HD after therapy day. Nephrology to manage dialysis,  -AV graft site is without tenderness after palpation. 9. CAD: metoprolol, ASA, monitor clinically with functional activities. Ejection fraction in the 45-50% range 10. A fib: rate controlled at present. Continue metoprolol  12. DM type II: sliding scale insulin. Will increase to moderate sensitivity. CBG (last 3)   Recent Labs  07/30/14 1730 07/30/14 2136 07/31/14 0645  GLUCAP 211* 183* 175*

## 2014-07-31 NOTE — Consult Note (Signed)
Indication for Consultation:  Management of ESRD/hemodialysis; anemia, hypertension/volume and secondary hyperparathyroidism  HPI: Briana Gould is a 68 y.o. female admitted 10/19 after cardiac arrest while getting HD cath placed, she became pulses and received 2 mins CPR, this was her 3rd cardiac arrest. She was unresponsive upon admission,  intubation. She was intubated 10/21. She has followed with cardiology is not a candidate for PCI, remains on medical therapy. She was treated for HCAP with vanc and ceftaz. She was discharged to [facility] 10/29 until 11/20, she is now in inpatient rehab. Has been tolerating HD well on midodrine, progressing with PT and feeling better. Will arrange HD today.   Past Medical History  Diagnosis Date  . PAD (peripheral artery disease)     a. L BKA  . Type II diabetes mellitus   . Anemia   . History of blood transfusion 2002    "related to amputation" (08/13/2013)  . End stage renal disease on dialysis     "[facility]; M, W, F" (08/13/2013)  . DVT (deep venous thrombosis)     a. R IJ DVT 08/2013.  Marland Kitchen CVA (cerebral infarction)     a. MRI 08/2013: possible left motor strip infarct with remote pontine infarct.  . Pulmonary HTN     a. Cath 2011 - mod to severe pulmonary hypertension. b. Echo 08/2013: PAP (mild-mod increased).  . Thrombocytopenia   . CAD (coronary artery disease)     a. Severe diffuse diabetic disease by cath 2011, for med rx, not a candidate for CABG or PCI at that time.  . Aortic stenosis     mild  . Syncope     a. 2011, etiology never clear.  . Carotid artery disease     a. Dopp 16/1096: 1-39% LICA< 60-79% RICA borderline >80%.  . Cerebrovascular disease     a. 08/2013 MRA brain: multiple bilateral moderate and high grade intracranial stenoses  . Cardiac arrest 08/14/13    a. Possible arrest in setting of hypotension during dialysis with possible arrest requiring brief CPR, not intubated and was fully intact after  episode. Suspected due to low cerebral perfusion in setting of hypotension. Unclear if any arrhythmia.  . End stage renal disease on dialysis   . Peripheral arterial occlusive disease   . Hypertension   . Diabetes mellitus type 2, uncontrolled, with complications   . DVT (deep venous thrombosis)      Right internal jugular vein  . CAD (coronary artery disease), native coronary artery 2011    Diffuse distal vessel CAD noted at cardiac catheterization  . MI (myocardial infarction)   . Stroke    Past Surgical History  Procedure Laterality Date  . Av fistula placement Left 2011    upper arm  . Below knee leg amputation Left 2002  . Tonsillectomy    . Cataract extraction w/ intraocular lens  implant, bilateral Bilateral 2012-2014  . Esophagogastroduodenoscopy N/A 08/30/2013    Procedure: ESOPHAGOGASTRODUODENOSCOPY (EGD);  Surgeon: Theda Belfast, MD;  Location: [facility] ENDOSCOPY;  Service: Endoscopy;  Laterality: N/A;  . Below knee leg amputation Left   . Av fistula placement Left    Family History  Problem Relation Age of Onset  . Hypertension Mother   . Hypertension Father   . Heart attack Brother   . Stroke Mother   . Pulmonary embolism Father    Social History:  reports that she has never smoked. She does not have any smokeless tobacco history on file. She  reports that she does not drink alcohol or use illicit drugs. Allergies  Allergen Reactions  . Phenergan [Promethazine Hcl] Other (See Comments)    Causes patient to" vomit more"  . Vitamin D Analogs Rash   Prior to Admission medications   Medication Sig Start Date End Date Taking? Authorizing Provider  acetaminophen (TYLENOL) 325 MG tablet Take 2 tablets (650 mg total) by mouth every 6 (six) hours as needed for moderate pain. 07/07/14   Ripudeep Jenna LuoK Rai, MD  albuterol (PROVENTIL) (2.5 MG/3ML) 0.083% nebulizer solution Take 3 mLs (2.5 mg total) by nebulization every 4 (four) hours as needed for wheezing. 12/17/13   Calvert CantorSaima Rizwan,  MD  Amino Acids-Protein Hydrolys (FEEDING SUPPLEMENT, PRO-STAT SUGAR FREE 64,) LIQD Take 30 mLs by mouth 2 (two) times daily at 10 AM and 5 PM. 07/07/14   Ripudeep Jenna LuoK Rai, MD  aspirin 81 MG chewable tablet Chew 1 tablet (81 mg total) by mouth daily. 07/07/14   Ripudeep Jenna LuoK Rai, MD  b complex-vitamin c-folic acid (NEPHRO-VITE) 0.8 MG TABS tablet Take 1 tablet by mouth daily.    Historical Provider, MD  dextromethorphan (DELSYM) 30 MG/5ML liquid Take 5 mLs (30 mg total) by mouth 2 (two) times daily. 12/17/13   Calvert CantorSaima Rizwan, MD  famotidine (PEPCID) 20 MG tablet Take 1 tablet (20 mg total) by mouth at bedtime. 10/25/13   Evlyn KannerPamela S Love, PA-C  fluticasone (FLOVENT HFA) 110 MCG/ACT inhaler Inhale 2 puffs into the lungs 2 (two) times daily. 12/17/13   Calvert CantorSaima Rizwan, MD  glimepiride (AMARYL) 1 MG tablet Take 1 tablet (1 mg total) by mouth daily with breakfast. 10/25/13   Jacquelynn CreePamela S Love, PA-C  guaiFENesin (MUCINEX) 600 MG 12 hr tablet Take 1 tablet (600 mg total) by mouth 2 (two) times daily. 12/17/13   Calvert CantorSaima Rizwan, MD  metoprolol (LOPRESSOR) 1 MG/ML injection Inject 2.5 mLs (2.5 mg total) into the vein every 6 (six) hours. 07/07/14   Ripudeep Jenna LuoK Rai, MD  midodrine (PROAMATINE) 5 MG tablet Take 1 tablet (5 mg total) by mouth every Monday, Wednesday, and Friday with hemodialysis. 10/25/13   Jacquelynn CreePamela S Love, PA-C  multivitamin (RENA-VIT) TABS tablet Take 1 tablet by mouth at bedtime. 07/07/14   Ripudeep Jenna LuoK Rai, MD  Nutritional Supplements (FEEDING SUPPLEMENT, NEPRO CARB STEADY,) LIQD Take 237 mLs by mouth daily.    Historical Provider, MD  pantoprazole (PROTONIX) 40 MG tablet Take 1 tablet (40 mg total) by mouth 2 (two) times daily. 10/25/13   Jacquelynn CreePamela S Love, PA-C  prednisoLONE acetate (PRED FORTE) 1 % ophthalmic suspension Place 1 drop into both eyes 3 (three) times daily. 12/17/13   Calvert CantorSaima Rizwan, MD  sevelamer carbonate (RENVELA) 800 MG tablet Take 1,600 mg by mouth 3 (three) times daily with meals.     Historical Provider, MD    Current Facility-Administered Medications  Medication Dose Route Frequency Provider Last Rate Last Dose  . 0.9 %  sodium chloride infusion  100 mL Intravenous PRN Sheffield SliderMartha B. Bergman, PA-C      . 0.9 %  sodium chloride infusion  100 mL Intravenous PRN Sheffield SliderMartha B. Bergman, PA-C      . acetaminophen (TYLENOL) tablet 325-650 mg  325-650 mg Oral Q4H PRN Jacquelynn CreePamela S Love, PA-C      . aluminum hydroxide (AMPHOJEL/ALTERNAGEL) suspension 15 mL  15 mL Oral Q6H PRN Jacquelynn CreePamela S Love, PA-C      . antiseptic oral rinse (BIOTENE) solution 15 mL  15 mL Mouth Rinse PRN Jacquelynn CreePamela S Love,  PA-C      . aspirin EC tablet 81 mg  81 mg Oral Daily Jacquelynn Creeamela S Love, PA-C   81 mg at 07/31/14 [id number]  . bisacodyl (DULCOLAX) suppository 10 mg  10 mg Rectal Daily PRN Jacquelynn CreePamela S Love, PA-C      . [START ON 08/03/2014] Darbepoetin Alfa (ARANESP) injection 60 mcg  60 mcg Intravenous Q Wed-HD Evlyn KannerPamela S Love, PA-C      . diphenhydrAMINE (BENADRYL) 12.5 MG/5ML elixir 12.5-25 mg  12.5-25 mg Oral Q6H PRN Evlyn KannerPamela S Love, PA-C      . enoxaparin (LOVENOX) injection 30 mg  30 mg Subcutaneous Q24H Pamela S Love, PA-C   30 mg at 07/29/14 1834  . famotidine (PEPCID) tablet 10 mg  10 mg Oral BID Jacquelynn Creeamela S Love, PA-C   10 mg at 07/31/14 0748  . feeding supplement (NEPRO CARB STEADY) liquid 237 mL  237 mL Oral TID WC Evlyn KannerPamela S Love, PA-C   237 mL at 07/31/14 0800  . feeding supplement (NEPRO CARB STEADY) liquid 237 mL  237 mL Oral PRN Sheffield SliderMartha B. Bergman, PA-C      . fluticasone (FLOVENT HFA) 110 MCG/ACT inhaler 2 puff  2 puff Inhalation BID Jacquelynn CreePamela S Love, PA-C   2 puff at 07/31/14 0915  . guaiFENesin (MUCINEX) 12 hr tablet 600 mg  600 mg Oral BID Jacquelynn Creeamela S Love, PA-C   600 mg at 07/31/14 [id number]  . heparin injection 1,000 Units  1,000 Units Dialysis PRN Sheffield SliderMartha B. Bergman, PA-C      . heparin injection 1,600 Units  20 Units/kg Dialysis PRN Sheffield SliderMartha B. Bergman, PA-C      . hydrocerin (EUCERIN) cream   Topical BID Evlyn KannerPamela S Love, PA-C      . insulin aspart (novoLOG)  injection 0-15 Units  0-15 Units Subcutaneous TID WC Bruce H Swords, MD      . lidocaine (PF) (XYLOCAINE) 1 % injection 5 mL  5 mL Intradermal PRN Sheffield SliderMartha B. Bergman, PA-C      . lidocaine-prilocaine (EMLA) cream 1 application  1 application Topical PRN Sheffield SliderMartha B. Bergman, PA-C      . metoCLOPramide (REGLAN) tablet 5 mg  5 mg Oral TID AC & HS Evlyn KannerPamela S Love, PA-C   5 mg at 07/31/14 0617  . metoprolol tartrate (LOPRESSOR) tablet 12.5 mg  12.5 mg Oral 4 times per day on Sun Tue Thu Sat Jacquelynn Creeamela S Love, PA-C   12.5 mg at 07/30/14 2147  . [START ON 08/01/2014] midodrine (PROAMATINE) tablet 5 mg  5 mg Oral Q M,W,F-HD Evlyn KannerPamela S Love, PA-C      . multivitamin (RENA-VIT) tablet 1 tablet  1 tablet Oral QHS Jacquelynn CreePamela S Love, PA-C   1 tablet at 07/30/14 2146  . ondansetron (ZOFRAN) tablet 4 mg  4 mg Oral Q6H PRN Jacquelynn CreePamela S Love, PA-C       Or  . ondansetron [facility](ZOFRAN) injection 4 mg  4 mg Intravenous Q6H PRN Jacquelynn CreePamela S Love, PA-C      . oxyCODONE (Oxy IR/ROXICODONE) immediate release tablet 5-10 mg  5-10 mg Oral Q4H PRN Jacquelynn CreePamela S Love, PA-C      . pentafluoroprop-tetrafluoroeth (GEBAUERS) aerosol 1 application  1 application Topical PRN Sheffield SliderMartha B. Bergman, PA-C      . polyethylene glycol (MIRALAX / GLYCOLAX) packet 17 g  17 g Oral Daily PRN Pamela S Love, PA-C      . prednisoLONE acetate (PRED FORTE) 1 % ophthalmic suspension 1 drop  1 drop Both Eyes  TID AC Jacquelynn Cree, PA-C   1 drop at 07/31/14 0617  . sevelamer carbonate (RENVELA) tablet 1,600 mg  1,600 mg Oral TID WC Evlyn Kanner Love, PA-C   1,600 mg at 07/31/14 0981   Labs: Basic Metabolic Panel:  Recent Labs Lab 07/27/14 0700 07/28/14 0640 07/29/14 0719  NA 134* 138 133*  K 5.0 4.9 5.1  CL 93* 98 94*  CO2 24 23 22   GLUCOSE 139* 129* 176*  BUN 55* 39* 56*  CREATININE 5.49* 4.31* 5.45*  CALCIUM 9.5 9.1 9.5  PHOS 3.0 3.0 3.0   Liver Function Tests:  Recent Labs Lab 07/27/14 0700 07/28/14 0640 07/29/14 0719  ALBUMIN 3.1* 2.9* 3.2*   No results for  input(s): LIPASE, AMYLASE in the last 168 hours. No results for input(s): AMMONIA in the last 168 hours. CBC:  Recent Labs Lab 07/25/14 0640 07/25/14 0829 07/27/14 0628  WBC 9.3 9.6  --   HGB 9.4* 9.0* 9.0*  HCT 29.5* 28.0* 28.7*  MCV 101.7* 100.7*  --   PLT 267 273  --    Cardiac Enzymes: No results for input(s): CKTOTAL, CKMB, CKMBINDEX, TROPONINI in the last 168 hours. CBG:  Recent Labs Lab 07/30/14 1257 07/30/14 1730 07/30/14 2136 07/31/14 0645 07/31/14 1203  GLUCAP 202* 211* 183* 175* 208*   Iron Studies: No results for input(s): IRON, TIBC, TRANSFERRIN, FERRITIN in the last 72 hours. Studies/Results: No results found.   Review of Systems: Gen: Feels strength is getting better. Denies any fever, chills, sweats, anorexia, fatigue, weakness, malaise, weight loss, and sleep disorder HEENT: No visual complaints, No history of Retinopathy. Normal external appearance No Epistaxis or Sore throat. No sinusitis.   CV: RLE edema-chronic. Denies chest pain, angina, palpitations, syncope, orthopnea, PND, and claudication. Resp: Denies dyspnea at rest, dyspnea with exercise, cough, sputum, wheezing, coughing up blood, and pleurisy. GI: Denies vomiting blood, jaundice, and fecal incontinence.   Denies dysphagia or odynophagia. Eating well.  GU : Denies urinary burning, blood in urine, urinary frequency, urinary hesitancy, nocturnal urination, and urinary incontinence.  No renal calculi. MS: Denies joint pain, limitation of movement, and swelling, stiffness, low back pain, extremity pain. Denies muscle weakness, cramps, atrophy.  No use of non steroidal antiinflammatory drugs. Derm: Denies rash, itching, dry skin, hives, moles, warts, or unhealing ulcers.  Psych: Denies depression, anxiety, memory loss, suicidal ideation, hallucinations, paranoia, and confusion. Heme: Denies bruising, bleeding, and enlarged lymph nodes. Neuro: No headache.  No diplopia. No dysarthria.  No dysphasia.   No history of CVA.  No Seizures. No paresthesias.  No weakness. Endocrine No DM.  No Thyroid disease.  No Adrenal disease.  Physical Exam: Filed Vitals:   07/30/14 2118 07/31/14 0532 07/31/14 0915 07/31/14 1146  BP:  119/51  139/59  Pulse:  69  73  Temp:  99.1 F (37.3 C)    TempSrc:  Oral    Resp:  16    Weight:  86.6 kg (190 lb 14.7 oz)    SpO2: 100% 100% 100% 98%     General: Well developed, well nourished, in no acute distress. Head: Normocephalic, atraumatic, sclera non-icteric, mucus membranes are moist Neck: Supple. JVD not elevated. No carotid bruits  Lungs: Clear bilaterally to auscultation without wheezes, rales, or rhonchi. Breathing is unlabored. Heart: RRR with S1 S2. No murmurs, rubs, or gallops appreciated. Abdomen: Soft, non-tender, non-distended with normoactive bowel sounds. No rebound/guarding. No obvious abdominal masses. M-S:  Strength and tone appear normal for age. Lower extremities: RLE +  2 edema. L BKA Neuro: Alert and oriented X 3. Moves all extremities spontaneously. Psych:  Responds to questions appropriately with a normal affect. Dialysis Access: L AVF +b    Hematoma and RLE 2+ edema (chronic)    R IJ (exchanged 11/9)  Dialysis Orders:  MWF  [facility]  75kgs  2K/2Ca   4hours 5000 Heparin  R IJ    L AVF   350/1.5    hectorol 2      Aranesp 25     Venofer 50 q week Profile 4      Assessment/Plan: 1. Cardiac Arrest- 3rd episode 10/19. Medical management for CAD, not a candidate for revascularization.  2. Afib- not a candidate for anticoagulation. Metop  3. ESRD -  HD MWF. Pending today. K+ 5.1 4. Hypertension/volume  - 139/59 on midodrine.  10kgs over previous outpt edw- RLE edema. Had outpt order for 3L max UF 5. Anemia  - hgb 9 - check CBC and Fe. Cont aranesp and weekly Fe- adjust pending labs  6. Metabolic bone disease -  Ca+9.5 Pth 769 (11/4) cont hectorol and phos 3, hold renvela.  7. Nutrition - alb 3.2. Nepro. Renal diet.  8. DM- per primary.  a1c 6.5 (11/20) 9. AVF hematoma - shuntogram 11/4 patent AVF 10. Dispo- now inpt rehab  Jetty Duhamel, NP [employer] [phone] 07/31/2014, 12:33 PM   Pt seen, examined and agree w A/P as above. May need extra HD this week, she is over dry wt 10kg.   Vinson Moselle MD pager [phone]    cell [phone] 07/31/2014, 4:16 PM

## 2014-07-31 NOTE — Plan of Care (Signed)
Problem: RH BOWEL ELIMINATION Goal: RH STG MANAGE BOWEL WITH ASSISTANCE STG Manage Bowel with min Assistance.  Outcome: Progressing  Problem: RH SAFETY Goal: RH STG ADHERE TO SAFETY PRECAUTIONS W/ASSISTANCE/DEVICE STG Adhere to Safety Precautions With min Assistance/Device.  Outcome: Progressing Goal: RH STG DECREASED RISK OF FALL WITH ASSISTANCE STG Decreased Risk of Fall With min Assistance.  Outcome: Progressing  Problem: RH PAIN MANAGEMENT Goal: RH STG PAIN MANAGED AT OR BELOW PT'S PAIN GOAL Less than 3 out of 10  Outcome: Progressing

## 2014-07-31 NOTE — Plan of Care (Signed)
Problem: RH SAFETY Goal: RH STG ADHERE TO SAFETY PRECAUTIONS W/ASSISTANCE/DEVICE STG Adhere to Safety Precautions With min Assistance/Device.  Outcome: Progressing Goal: RH STG DECREASED RISK OF FALL WITH ASSISTANCE STG Decreased Risk of Fall With min Assistance.  Outcome: Progressing  Problem: RH COGNITION-NURSING Goal: RH STG ANTICIPATES NEEDS/CALLS FOR ASSIST W/ASSIST/CUES STG Anticipates Needs/Calls for Assist With Assistance/Cues.  Outcome: Progressing  Problem: RH PAIN MANAGEMENT Goal: RH STG PAIN MANAGED AT OR BELOW PT'S PAIN GOAL Less than 3 out of 10  Outcome: Progressing

## 2014-07-31 NOTE — Progress Notes (Signed)
Physical Therapy Session Note  Patient Details  Name: Briana Gould MRN: [id number] Date of Birth: 02-20-1946  Today's Date: 07/31/2014 PT Individual Time: 1100-1200 PT Individual Time Calculation (min): 60 min   Short Term Goals: Week 1:  PT Short Term Goal 1 (Week 1): Pt will demonstrate supine to sit transfer with SBA PT Short Term Goal 2 (Week 1): Pt will scoot transfer with SB req mod A.  PT Short Term Goal 3 (Week 1): Pt will self propel manual w/c x 100' req SBA.  PT Short Term Goal 4 (Week 1): Pt will initiate ambulate with PT during session.  PT Short Term Goal 5 (Week 1): Pt will demonstrate sit to supine transfer with SBA.   Skilled Therapeutic Interventions/Progress Updates:    Pt dependent on valsalva strategies for core stability in session, with limited carryover. Pt best when told to talk and perform activities. Core stability deficits contributing to limited ability to perform transfers and standign tasks at this point in time. Pt would continue to benefit from skilled PT services to increase functional mobility.  Therapy Documentation Precautions:  Precautions Precautions: Fall Precaution Comments: old left BKA Restrictions Weight Bearing Restrictions: No Other Position/Activity Restrictions: old L BKA General:   Vital Signs: Therapy Vitals Pulse Rate: 73 (s/p activity, 70 at rest) BP: (!) 139/59 mmHg (s/p activity, BP 137/65 at rest) Oxygen Therapy SpO2: 98 % ( s/p activity and rest) O2 Device: Not Delivered Pain: Pain Assessment Pain Assessment: No/denies pain Mobility:  Pt performs transfers with slideboard Max A with cues for weight shift, technique, and sequencing Other Treatments:   Pt performs anterior weight shifts, partial sit to stands, hip add isometrics, hip abd isometrics, HS isometrics, marching, scap retraction, glute sets, LAQs 2x10 each. Pt educated on rehab plan, safety in mobility, and avoidance of valsalva. Pt positioned in neutral spine  alignment. Scooting x5 in session. Thoracic spine AAROM with myofascial release. See FIM for current functional status  Therapy/Group: Individual Therapy  Briana Gould, Briana Gould 07/31/2014, 11:51 AM

## 2014-07-31 NOTE — Progress Notes (Signed)
Occupational Therapy Session Notes  Patient Details  Name: Briana Gould MRN: [id number] Date of Birth: 1946/07/13  Today's Date: 07/31/2014  Short Term Goals: Week 1:  OT Short Term Goal 1 (Week 1): Pt would be able to transfer to and from BSC/ toliet with mod A  OT Short Term Goal 2 (Week 1): Pt would tolerate prosethesis on during transfer training OT Short Term Goal 3 (Week 1): Pt would be able to perform toielting after BM with mod A  OT Short Term Goal 4 (Week 1): Pt will perform UB dressing with setup   Skilled Therapeutic Interventions/Progress Updates:   Session 1: OT Individual Time: 0800-0900 OT Individual Calculated Time (min): 60 min Skilled OT session focusing on ADL retraining, functional transfers, bed mobility, and activity tolerance/endurance. Pt seated on EOB finishing breakfast when arriving, reporting no pain. BP at 150/78 before beginning session. Pt completed UB bathing and dressing, requiring assistance with fastening bra. Pt able to report why she is in rehab, but unsure of how long she has been in the hospital. BP at 130/58 and pt requesting to lie down. Talked to RN about limits for BP, reported none in chart and pt had not been given BP medication this morning due to afternoon dialysis. After rest break, pt transferred to EOB and completed LB bathing. Pt transferred to supine and donned pants by rolling L and R. Pt requesting to remain supine due to fatigue. BP at 122/53. Pt supine in bed with all needs nearby when leaving. Pt requiring more than reasonable amount of time to complete all tasks during this session and demonstrating endurance deficit, requiring multiple rest breaks throughout.  Session 2: OT Individual Time: 1400-1500 OT Individual Calculated Time (min): 60 min Skilled OT session focusing on ADL retraining, functional transfers, w/c propulsion, UE strength and ROM, dynamic sitting balance, sequencing, and activity tolerance/endurance. Pt seated in w/c  when arriving, requesting help with operating phone and reporting no pain. BP at 132/106. Pt emotional about not being ale to use phone properly. Educated pt on use of room phone and assisted with sequencing buttons on cell phone so that pt could call brother and ask him to bring w/c to her so that it could be evaluated by therapists. Pt self-propelled w/c to sink and completed tooth brushing, hand washing, face washing, and hair brushing. Pt self-propelled w/c to dayroom, with 2 rest breaks, and completed dynamic sitting balance activity of watering plants to work on UE strength and ROM, reaching, and endurance. Educated and oriented pt on day room and family room and pt brought back to room. Pt transferred to bed via slide board with mod v/c for sequencing and hand placement and mod A. Pt transferred to supine with min A. Pt supine in bed with all needs nearby when leaving.  Therapy Documentation Precautions:  Precautions Precautions: Fall Precaution Comments: old left BKA Restrictions Weight Bearing Restrictions: No Other Position/Activity Restrictions: old L BKA  See FIM for current functional status  Therapy/Group: Individual Therapy  [name] 07/31/2014, 8:14 AM

## 2014-08-01 ENCOUNTER — Inpatient Hospital Stay (HOSPITAL_COMMUNITY): Payer: Medicare Other

## 2014-08-01 ENCOUNTER — Inpatient Hospital Stay (HOSPITAL_COMMUNITY): Payer: Medicare Other | Admitting: Speech Pathology

## 2014-08-01 ENCOUNTER — Inpatient Hospital Stay (HOSPITAL_COMMUNITY): Payer: Medicare Other | Admitting: Occupational Therapy

## 2014-08-01 DIAGNOSIS — D631 Anemia in chronic kidney disease: Secondary | ICD-10-CM

## 2014-08-01 DIAGNOSIS — N189 Chronic kidney disease, unspecified: Secondary | ICD-10-CM

## 2014-08-01 LAB — IRON AND TIBC
Iron: 57 ug/dL (ref 42–135)
SATURATION RATIOS: 29 % (ref 20–55)
TIBC: 200 ug/dL — AB (ref 250–470)
UIBC: 143 ug/dL (ref 125–400)

## 2014-08-01 LAB — GLUCOSE, CAPILLARY
GLUCOSE-CAPILLARY: 178 mg/dL — AB (ref 70–99)
Glucose-Capillary: 157 mg/dL — ABNORMAL HIGH (ref 70–99)
Glucose-Capillary: 261 mg/dL — ABNORMAL HIGH (ref 70–99)
Glucose-Capillary: 227 mg/dL — ABNORMAL HIGH (ref 70–99)

## 2014-08-01 LAB — OCCULT BLOOD X 1 CARD TO LAB, STOOL: Fecal Occult Bld: NEGATIVE

## 2014-08-01 LAB — OCCULT BLOOD, POC DEVICE: Fecal Occult Bld: POSITIVE — AB

## 2014-08-01 MED ORDER — POLYETHYLENE GLYCOL 3350 17 G PO PACK
17.0000 g | PACK | Freq: Every day | ORAL | Status: DC
Start: 2014-08-02 — End: 2014-08-19
  Administered 2014-08-02 – 2014-08-14 (×9): 17 g via ORAL
  Filled 2014-08-01 (×19): qty 1

## 2014-08-01 MED ORDER — DOCUSATE SODIUM 283 MG RE ENEM
1.0000 | ENEMA | Freq: Every day | RECTAL | Status: DC | PRN
  Filled 2014-08-01: qty 1

## 2014-08-01 MED ORDER — SORBITOL 70 % SOLN: 30.0000 mL | Freq: Three times a day (TID) | Status: DC | PRN

## 2014-08-01 MED ORDER — DARBEPOETIN ALFA 150 MCG/0.3ML IJ SOSY
150.0000 ug | PREFILLED_SYRINGE | INTRAMUSCULAR | Status: DC
  Administered 2014-08-02 – 2014-08-10 (×2): 150 ug via INTRAVENOUS
  Filled 2014-08-01 (×3): qty 0.3

## 2014-08-01 NOTE — Progress Notes (Signed)
Physical Therapy Session Note  Patient Details  Name: Briana Gould MRN: [id number] Date of Birth: [birth year]  Today's Date: 08/01/2014 PT Individual Time:1303-1313,  [id number] PT Individual Time Calculation (min): 10 min, 20 min   Short Term Goals: Week 1:  PT Short Term Goal 1 (Week 1): Pt will demonstrate supine to sit transfer with SBA PT Short Term Goal 2 (Week 1): Pt will scoot transfer with SB req mod A.  PT Short Term Goal 3 (Week 1): Pt will self propel manual w/c x 100' req SBA.  PT Short Term Goal 4 (Week 1): Pt will initiate ambulate with PT during session.  PT Short Term Goal 5 (Week 1): Pt will demonstrate sit to supine transfer with SBA.   Skilled Therapeutic Interventions/Progress Updates:   Pt missed 30 minutes due to enema.   Tx 1:  RN requested that pt transfer to bed for enema.   SBT to L with max assist > +2 due to slightly higher bed surface.  [facility]   Tx 2:  Therapist returned when pt was through. Pt still felt that she might have further BM; stated that she felt uncomfortable.  Bedside therapeutic exercise performed with LEs to increase strength for functional mobility: 20 x 1 L and R short arc quad knee ext, modified abdominal crunches, bil hip add/IR against resistance.   Pt felt she might be having more BM; rolling L and R for placement of bed pan, using rails, with supervision. No results.  Pt left resting in bed, all needs in reach and bed alarm on.  PT encouraged pt to get OOB to w/c later in day when she was feeling better.    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

## 2014-08-01 NOTE — Progress Notes (Signed)
Occupational Therapy Session Note  Patient Details  Name: Briana Gould MRN: [id number] Date of Birth: 10-18-1945  Today's Date: 08/01/2014 OT Individual Time: [id number] OT Individual Time Calculation (min): 60 min    Short Term Goals: Week 1:  OT Short Term Goal 1 (Week 1): Pt would be able to transfer to and from BSC/ toliet with mod A  OT Short Term Goal 2 (Week 1): Pt would tolerate prosethesis on during transfer training OT Short Term Goal 3 (Week 1): Pt would be able to perform toielting after BM with mod A  OT Short Term Goal 4 (Week 1): Pt will perform UB dressing with setup   Skilled Therapeutic Interventions/Progress Updates:      Pt seen for BADL retraining of toileting, bathing, and dressing with a focus on activity tolerance and functional transfers. Pt in bed at start of session and able to sit to EOB with supervision. She performed lateral leans well to place padding and sliding Board under her thighs. Her nurse assisted with stabilizing w/c. Pt used board with max A and cues to lean forward, use flat hands, push with R leg. [name] arrived and brought her w/c from home.  Pt worked on bathing, grooming, and UB dressing from w/c at sink level and then needed toilet. W/c place in bathroom adjacent to toilet. Attempted 3x to have pt pull up with grab bars but she could not move any body weight. Her NT arrived to assist, but pt could not pull up and was nervous to pull too aggressively with L arm.  A wide drop arm BSC was located and pt completed a SB with max A [facility] with NT present incase chairs were to slide. Pt sitting for a few minutes, but needed more time to toilet. Therapy session time was over, so NT will have a 2nd helper to transfer pt off BSC.  Instructed pt to use lateral leans for cleansing and to don pants over hips.  NT with pt at end of session.  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

## 2014-08-01 NOTE — Plan of Care (Signed)
Problem: RH BOWEL ELIMINATION Goal: RH STG MANAGE BOWEL WITH ASSISTANCE STG Manage Bowel with min Assistance.  Outcome: Progressing Goal: RH STG MANAGE BOWEL W/MEDICATION W/ASSISTANCE STG Manage Bowel with Medication with min Assistance.  Outcome: Progressing  Problem: RH SAFETY Goal: RH STG ADHERE TO SAFETY PRECAUTIONS W/ASSISTANCE/DEVICE STG Adhere to Safety Precautions With min Assistance/Device.  Outcome: Progressing Goal: RH STG DECREASED RISK OF FALL WITH ASSISTANCE STG Decreased Risk of Fall With min Assistance.  Outcome: Progressing  Problem: RH COGNITION-NURSING Goal: RH STG USES MEMORY AIDS/STRATEGIES W/ASSIST TO PROBLEM SOLVE STG Uses Memory Aids/Strategies With Assistance to Problem Solve.  Outcome: Progressing Goal: RH STG ANTICIPATES NEEDS/CALLS FOR ASSIST W/ASSIST/CUES STG Anticipates Needs/Calls for Assist With Assistance/Cues.  Outcome: Progressing  Problem: RH PAIN MANAGEMENT Goal: RH STG PAIN MANAGED AT OR BELOW PT'S PAIN GOAL Less than 3 out of 10  Outcome: Progressing

## 2014-08-01 NOTE — Progress Notes (Signed)
[facility] Rounding Note Subjective:  Constipated On bedside commode Not having much results "Can you just come back?" Rather disgusted  Objective Vital signs in last 24 hours: Filed Vitals:   07/31/14 1941 07/31/14 2011 07/31/14 2225 08/01/14 0415  BP: 135/75 135/48 135/55 116/48  Pulse: 71 61 73 68  Temp: 98.1 F (36.7 C)   99.3 F (37.4 C)  TempSrc: Oral   Oral  Resp: 22 20  18   Weight: 82.3 kg (181 lb 7 oz)   84.2 kg (185 lb 10 oz)  SpO2: 97% 100% 100% 100%   Weight change: -1.4 kg (-3 lb 1.4 oz)  Intake/Output Summary (Last 24 hours) at 08/01/14 1044 Last data filed at 07/31/14 2230  Gross per 24 hour  Intake    240 ml  Output   2886 ml  Net  -2646 ml   Physical Exam:  Blood pressure 116/48, pulse 68, temperature 99.3 F (37.4 C), temperature source Oral, resp. rate 18, weight 84.2 kg (185 lb 10 oz), SpO2 100 %. General: Well developed, well nourished, sitting on the bedside commode Head: Normocephalic, atraumatic Neck: Supple. JVD not elevated.   Lungs: Clear bilaterally to auscultation without wheezes, rales, or rhonchi. Heart: RRR with S1 S2. No murmurs, rubs, or gallops appreciated. Abdomen: Unable to examine on bedside commode Lower extremities: RLE +2 edema. L BKA Neuro: Alert and oriented X 3. Moves all extremities Psych: Responds to questions appropriately with a normal affect, though currently somewhat disgusted Dialysis Access: L AVF +B/Hematoma  R IJ (exchanged 11/9) TDC with dressing in place  Labs: No new labs today   Recent Labs Lab 07/27/14 0700 07/28/14 0640 07/29/14 0719 07/31/14 1609  NA 134* 138 133* 133*  K 5.0 4.9 5.1 4.6  CL 93* 98 94* 91*  CO2 24 23 22 24   GLUCOSE 139* 129* 176* 226*  BUN 55* 39* 56* 58*  CREATININE 5.49* 4.31* 5.45* 5.44*  CALCIUM 9.5 9.1 9.5 9.2  PHOS 3.0 3.0 3.0 2.9     Recent Labs Lab 07/28/14 0640 07/29/14 0719 07/31/14 1609  ALBUMIN 2.9* 3.2* 3.1*   Recent Labs Lab  07/27/14 0628 07/31/14 1609  WBC  --  9.9  HGB 9.0* 8.1*  HCT 28.7* 25.7*  MCV  --  98.1  PLT  --  208    Recent Labs Lab 07/30/14 2136 07/31/14 0645 07/31/14 1203 07/31/14 1958 08/01/14 0636  GLUCAP 183* 175* 208* 104* 157*   Medications:   . aspirin EC  81 mg Oral Daily  . [START ON 08/03/2014] darbepoetin (ARANESP) injection - DIALYSIS  60 mcg Intravenous Q Wed-HD  . doxercalciferol  5 mcg Intravenous Q M,W,F-HD  . famotidine  10 mg Oral BID  . feeding supplement (NEPRO CARB STEADY)  237 mL Oral TID WC  . [START ON 08/03/2014] ferric gluconate (FERRLECIT/NULECIT) IV  62.5 mg Intravenous Weekly  . fluticasone  2 puff Inhalation BID  . guaiFENesin  600 mg Oral BID  . hydrocerin   Topical BID  . insulin aspart  0-15 Units Subcutaneous TID WC  . metoCLOPramide  5 mg Oral TID AC & HS  . metoprolol tartrate  12.5 mg Oral 4 times per day on Sun Tue Thu Sat  . midodrine  5 mg Oral Q M,W,F-HD  . multivitamin  1 tablet Oral QHS  . prednisoLONE acetate  1 drop Both Eyes TID AC  . sevelamer carbonate  1,600 mg Oral TID WC     Dialysis Orders: MWF  [facility] (from prior to 06/27/14 admission post cardiac arrest)  75kgs 2K/2Ca 4hours  5000 Heparin R IJ L AVF 350/1.5  hectorol 2 Aranesp 25this has been increased with adm back to rehab unit Venofer 50 q week Profile 4   BACKGROUND 68 y.o. AA female with DM, ESRD on HD, PAD, left BKA, severe know CAD (not surgical candidate), h/o CVA originally admitted 10/19 after cardiac arrest while getting HD cath placed (due to hematoma AVF), when she became pulseless and received 2 mins CPR, this was her 3rd cardiac arrest. She was unresponsive upon admission,required intubation. She has followed with cardiology is not a candidate for PCI, remains on medical therapy. She was treated for HCAP with vanc and ceftaz. She was discharged to [facility] 10/29 until 11/20, she is now in inpatient rehab. Has been tolerating HD well  on midodrine, progressing with PT and feeling better.  Assessment/Plan: 1. Cardiac Arrest- 3rd episode 10/19. Medical management for CAD, not a candidate for revascularization.  2. Afib- not a candidate for anticoagulation. Metoprolol  3. ESRD - HD MWF. Holiday schedule Sun Tues Fri so HD tomorrow Keep BFR 350, 3 liters off max to minimize cardiac stress as much as possible. Midodrine. 4. Hypertension/volume - 139/59 on midodrine. 10kgs over previous outpt edw- RLE edema. Had outpt order for 3L max UF 5. Anemia - hgb 8.1. Cont aranesp increase dose to 150 . Check iron studies with  HD tomorrow 6. Metabolic bone disease - Ca+9.5 Pth 769 (11/4) cont hectorol and phos 3, hold renvela.  7. Nutrition - alb 3.2. Nepro. Renal diet.  8. DM- per primary. a1c 6.5 (11/20) 9. AVF hematoma - shuntogram 11/4 patent AV access; 2 hematomas still present on Duplex from 11/14. [facility]. 10. Constipation - laxatives 11. Dispo- Inpt rehab   Briana Balynthia Linzie Criss, MD [facility] [phone] pager 08/01/2014, 10:44 AM

## 2014-08-01 NOTE — Progress Notes (Signed)
Social Work Assessment and Plan Social Work Assessment and Plan  Patient Details  Name: Briana Gould MRN: [id number] Date of Birth: July 16, 1946  Today's Date: 08/01/2014  Problem List:  Patient Active Problem List   Diagnosis Date Noted  . Critical illness myopathy 07/29/2014  . HCAP (healthcare-associated pneumonia) 12/12/2013  . Physical deconditioning 10/15/2013  . CVA (cerebral infarction) 10/01/2013  . Paroxysmal a-fib 10/01/2013  . Cardiac arrest 09/20/2013  . Acute respiratory failure with hypoxia 09/20/2013  . Hypertension 09/20/2013  . Encephalopathy acute 09/20/2013  . Bradycardia- intol to beta blocker 09/03/2013  . Anemia of chronic renal failure 08/30/2013  . Secondary hyperparathyroidism 08/30/2013  . GIB (gastrointestinal bleeding)--recurrent- not a candidate for Plavix 08/29/2013  . CAD- severe 3V CAD at cath March 2015- medical Rx 08/16/2013  . History of syncope 08/16/2013  . Aortic stenosis 08/16/2013  . 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 Cerebrovascular disease 08/15/2013  . Thrombocytopenia 08/14/2013  . Seizure 08/13/2013  . PAD (peripheral artery disease) 08/13/2013  . Weakness generalized 11/08/2011  . ESRD (end stage renal disease) on dialysis 11/04/2011  . MITRAL REGURGITATION 10/04/2010  . AORTIC STENOSIS 10/04/2010  . 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 HYPERLIPIDEMIA 09/13/2010  . OBESITY 09/13/2010  . Essential hypertension 09/13/2010  . PULMONARY HYPERTENSION 09/13/2010  . Status post below knee amputation 09/13/2010   Past Medical History:  Past Medical History  Diagnosis Date  . PAD (peripheral artery disease)     a. L BKA  . Type II diabetes mellitus   . Anemia   . History of blood transfusion 2002    "related to amputation" (08/13/2013)  . End stage renal disease on dialysis     [facility] (08/13/2013)  . DVT (deep venous thrombosis)     a. R IJ DVT 08/2013.  Marland Kitchen. CVA (cerebral  infarction)     a. MRI 08/2013: possible left motor strip infarct with remote pontine infarct.  . Pulmonary HTN     a. Cath 2011 - mod to severe pulmonary hypertension. b. Echo 08/2013: PAP 45mmHg (mild-mod increased).  . Thrombocytopenia   . CAD (coronary artery disease)     a. Severe diffuse diabetic disease by cath 2011, for med rx, not a candidate for CABG or PCI at that time.  . Aortic stenosis     mild  . Syncope     a. 2011, etiology never clear.  . Carotid artery disease     a. Dopp 40/981112/2014: 1-39% LICA< 60-79% RICA borderline >80%.  . Cerebrovascular disease     a. 08/2013 MRA brain: multiple bilateral moderate and high grade intracranial stenoses  . Cardiac arrest 08/14/13    a. Possible arrest in setting of hypotension during dialysis with possible arrest requiring brief CPR, not intubated and was fully intact after episode. Suspected due to low cerebral perfusion in setting of hypotension. Unclear if any arrhythmia.  . End stage renal disease on dialysis   . Peripheral arterial occlusive disease   . Hypertension   . Diabetes mellitus type 2, uncontrolled, with complications   . DVT (deep venous thrombosis)      Right internal jugular vein  . CAD (coronary artery disease), native coronary artery 2011    Diffuse distal vessel CAD noted at cardiac catheterization  . MI (myocardial infarction)   . Stroke    Past Surgical History:  Past Surgical History  Procedure Laterality Date  . Av fistula placement Left 2011  upper arm  . Below knee leg amputation Left 2002  . Tonsillectomy    . Cataract extraction w/ intraocular lens  implant, bilateral Bilateral 2012-2014  . Esophagogastroduodenoscopy N/A 08/30/2013    Procedure: ESOPHAGOGASTRODUODENOSCOPY (EGD);  Surgeon: Briana Belfast, MD;  Location: [facility] ENDOSCOPY;  Service: Endoscopy;  Laterality: N/A;  . Below knee leg amputation Left   . Av fistula placement Left    Social History:  reports that she has never smoked. She  does not have any smokeless tobacco history on file. She reports that she does not drink alcohol or use illicit drugs.  Family / Support Systems Marital Status: Widow/Widower How Long?: recent Patient Roles: Parent, Caregiver Children: [name]  [phone]  [phone]-cell Other Supports: [name]  [phone] Anticipated Caregiver: [name] Ability/Limitations of Caregiver: daughter works during the day and pt was the caregiver fo her grandchildren after school Caregiver Availability: Evenings only Family Dynamics: Close knit family who are supportive and involved with one another.  She is still grieving the loss of her husband and realizes this will take time.  She has been so ill and now is finally feeling better so will begin to deal with it.  Social History Preferred language: English Religion: Episcopalian Cultural Background: No issues Education: High School Read: Yes Write: Yes Employment Status: Retired Fish farm manager Issues: No issues Guardian/Conservator: None-according to MD pt is capable fo making ehr own decisions while here.   Abuse/Neglect Physical Abuse: Denies Verbal Abuse: Denies Sexual Abuse: Denies Exploitation of patient/patient's resources: Denies Self-Neglect: Denies  Emotional Status Pt's affect, behavior adn adjustment status: Pt is motivated to improve and get out of the hospital.  [facility] has been here on rehab before and knows the process and feels comfortable with the staff and process here.  She has always been one to recover and hopes once again to do this and remain independent and alb eto taek car eof her grnadchildren. Recent Psychosocial Issues: Other medical issues has been in hosptial several times before. Pyschiatric History: No history now with the recent loss of her husband may need to see neuro-psych while here besides Child psychotherapist.  She declined depression screening due to tired from therapies. Now used to all of this  activity. Substance Abuse History: No issues  Patient / Family Perceptions, Expectations & Goals Pt/Family understanding of illness & functional limitations: Pt and daughter have a good understanding of her conditions and treamtent plan.  Her main goal is to get stronger and able to stay alone during the day while her daughgter works.  She feels she is on the right track and will reach her goals before leaving here. Premorbid pt/family roles/activities: Mother, grandmotehr, retiree, Dialysis pt, sister, Church member, etc Anticipated changes in roles/activities/participation: resume Pt/family expectations/goals: Pt states; " I will get independent again it will take time but I am patient."  Daughter states: " If anyone can do it my Mom can, she is a IT sales professional."  [employer]: Other (Comment) (M,W,F-HD- [facility]) Premorbid Home Care/DME Agencies: Other (Comment) (Had [facility] in the past) Transportation available at discharge: Family members Resource referrals recommended: Support group (specify), Neuropsychology  Discharge Planning Living Arrangements: Children, Other relatives Support Systems: Children, Other relatives, Friends/neighbors, Church/faith community Type of Residence: Private residence Insurance Resources: [employer], Media planner (specify) Biomedical engineer) Financial Resources: Restaurant manager, fast food Screen Referred: No Living Expenses: Lives with family Money Management: Family Does the patient have any problems obtaining your medications?: No Home Management: daughter and pt helped prior to  admission Patient/Family Preliminary Plans: Return home with daughter and grandchildren, daughter works during the day and is hoping pt will be safe to be alone during this time.  Pt's brother can check in on here also.  Awaiting goals and target discharge date. Social Work Anticipated Follow Up Needs: HH/OP, Support Group  Clinical Impression Pleasant female  who is willing to work hard in therapies to achieve her goals, she has been here in Feb and knows the process.  Her daughter is supportive and will do what she can to assist Her Mother.  Pt is grieving the recent loss of her husband and would benefit from seeing Neuro-psych while here for added support.  Will work on a safe discharge plan.  Lucy Chrisupree, Miko Markwood G 08/01/2014, 11:46 AM

## 2014-08-01 NOTE — Progress Notes (Signed)
Patient information reviewed and entered into eRehab system by Kassidie Hendriks, RN, CRRN, PPS Coordinator.  Information including medical coding and functional independence measure will be reviewed and updated through discharge.     Per nursing patient was given "Data Collection Information Summary for Patients in Inpatient Rehabilitation Facilities with attached "Privacy Act Statement-Health Care Records" upon admission.  

## 2014-08-01 NOTE — IPOC Note (Signed)
Overall Plan of Care [facility](IPOC) Patient Details Name: Briana Gould MRN: [id number] DOB: [birth year]  Admitting Diagnosis: Critical illness Myopathy  deconditioning   Hospital Problems: Active Problems:   Diabetes mellitus with end stage renal disease   Essential hypertension   Status post below knee amputation   ESRD (end stage renal disease) on dialysis   Critical illness myopathy     Functional Problem List: Nursing Bowel, Edema, Endurance, Perception, Safety, Skin Integrity  PT Balance, Edema, Endurance, Motor, Pain, Safety, Sensory, Skin Integrity  OT Balance, Cognition, Endurance, Motor, Pain, Safety, Skin Integrity  SLP Nutrition  TR         Basic ADL's: OT Grooming, Bathing, Dressing, Toileting     Advanced  ADL's: OT       Transfers: PT Bed Mobility, Bed to Chair, Car, Occupational psychologisturniture  OT Toilet, Research scientist (life sciences)Tub/Shower     Locomotion: PT Ambulation, Psychologist, prison and probation servicesWheelchair Mobility     Additional Impairments: OT Fuctional Use of Upper Extremity  SLP Swallowing      TR      Anticipated Outcomes Item Anticipated Outcome  Self Feeding no goal   Swallowing  mod I with least restrictive diet    Basic self-care  supervision to min A   Toileting  min A    Bathroom Transfers min A   Bowel/Bladder  Pt will be continent of bowel with min assist, patient is anuric  Transfers  supervision  Locomotion  household distances mod I  Communication     Cognition  to be assessed at next available appointment   Pain  patient's pain will be equal to or less than 3 on a scale of 0-10 with min assist  Safety/Judgment  Patient will be free from falls/injury and display proper safty awareness with min assist   Therapy Plan: PT Intensity: Minimum of 1-2 x/day ,45 to 90 minutes PT Frequency: 5 out of 7 days PT Duration Estimated Length of Stay: 14-17 days OT Intensity: Minimum of 1-2 x/day, 45 to 90 minutes OT Frequency: 5 out of 7 days OT Duration/Estimated Length of Stay: 14-20days SLP  Intensity: Minumum of 1-2 x/day, 30 to 90 minutes SLP Frequency: 5 out of 7 days SLP Duration/Estimated Length of Stay: 14-17 days        Team Interventions: Nursing Interventions Patient/Family Education, Bowel Management, Disease Management/Prevention, Pain Management, Medication Management, Skin Care/Wound Management, Cognitive Remediation/Compensation, Dysphagia/Aspiration Precaution Training, Discharge Planning  PT interventions Ambulation/gait training, DME/adaptive equipment instruction, Psychosocial support, UE/LE Strength taining/ROM, Balance/vestibular training, Skin care/wound management, UE/LE Coordination activities, Cognitive remediation/compensation, Functional mobility training, Splinting/orthotics, Visual/perceptual remediation/compensation, FirefighterCommunity reintegration, Neuromuscular re-education, Wheelchair propulsion/positioning, Discharge planning, Pain management, Therapeutic Activities, Disease management/prevention, Equities traderatient/family education, Therapeutic Exercise  OT Interventions Warden/rangerBalance/vestibular training, Community reintegration, Cognitive remediation/compensation, Discharge planning, Disease mangement/prevention, Fish farm managerDME/adaptive equipment instruction, Psychosocial support, Patient/family education, Therapeutic Exercise, UE/LE Strength taining/ROM, Pain management, Therapeutic Activities, Skin care/wound managment, Functional mobility training, UE/LE Coordination activities, Self Care/advanced ADL retraining, Wheelchair propulsion/positioning  SLP Interventions Dysphagia/aspiration precaution training, Environmental controls, Internal/external aids  TR Interventions    SW/CM Interventions      Team Discharge Planning: Destination: PT-Home ,OT- Home , SLP- (TBD) Projected Follow-up: PT-Home health PT, OT-  Home health OT, SLP- (TBD) Projected Equipment Needs: PT-To be determined, OT- To be determined, SLP-None recommended by SLP Equipment Details: PT-Pt reports her own w/c is over  68 years old and is in poor shape - pt will likely need a new one, OT-  Patient/family involved in discharge planning: PT- Patient,  OT-Patient,  SLP-Patient  MD ELOS: 15-22d Medical Rehab Prognosis:  Good Assessment: 68 year old female with h/o severe multivessel CAD (not candidate for percutaneous intervention), ESRD on HD, and PAD s/p R- BKA, h/o prior cardiac arrest X 2; who was admitted to on 06/27/2014 due to cardiopulmonary arrest past having HD cath placed at outpatient center. She received CPR for 2 minutes with return of spontaneous circulation and was unresponsive w/ ineffective ventilation and posturing on arrival to ED. She was placed under the hypothermia protocol. 2D echo with EF 45-50% with mild LVF and elevated pulmonary pressures. On 06/29/2014 she was rewarmed and extubated but remained lethargic and nonverbal with concerns of anoxic encephalopathy. CT head negative. EEG done showing diffuse cerebral disturbance--non-specific. CXR on 10/22 with LLL HCAP and patient has completed her antibiotic course. Cardiology consulted for input and recommended medical treatment as EKG and troponin negative. PCCM felt that arrest most likely due to baseline hypotension and pulmonary hypertension. A fib treated with IV Cardizem with recommendations to transition to oral BB with close monitoring as due to patient's h/o hypotension past HD.   Now requiring 24/7 Rehab RN,MD, as well as CIR level PT, OT and SLP.  Treatment team will focus on ADLs and mobility with goals set at min-sup   See Team Conference Notes for weekly updates to the plan of care

## 2014-08-01 NOTE — Progress Notes (Signed)
Alen Blew, MD Physician Signed Physical Medicine and Rehabilitation PMR Pre-admission 07/29/2014 2:47 PM  Related encounter: Admission (Discharged) from 07/07/2014 in [place] PMR Admission Coordinator Pre-Admission Assessment  Patient: Briana Gould is an 68 y.o., female MRN: [id number] DOB: 11/23/1945 Height: 5' 6"  (167.6 cm) Weight: 80.468 kg (177 lb 6.4 oz)  Insurance Information  PRIMARY: Medicare A & B Policy#: [id number] a Subscriber: self Pre-Cert#: verified in [employer]: retired Runner, broadcasting/film/video. Date: A & B: 10-10-10 Deduct: $1260 Out of Pocket Max: none Life Max: unlimited CIR: 100% SNF: 100% days 1-20; 80% days 21-100 (100 day visit limit) Outpatient: 80% Co-Pay: 20% Home Health: 100% Co-Pay: none DME: 80% Co-Pay: 20% Providers: pt's preference  SECONDARY: Tricare for Life Policy#: [id number] Subscriber: pt's spouse Benefits: Phone #: [phone]   Emergency Contact Information Contact Information    Name Relation Home Work [place] Daughter [phone]  [phone]   Kris Mouton   [phone]      Current Medical History  Patient Admitting Diagnosis: Critical illness myopathy  History of Present Illness: 68 year old female with h/o severe multivessel CAD (not candidate for percutaneous intervention), ESRD on HD, and PAD s/p R- BKA, h/o prior cardiac arrest X 2; who was admitted to on 06/27/2014 due to cardiopulmonary arrest past having HD cath placed at outpatient center. She received CPR for 2 minutes with return of spontaneous circulation and was unresponsive w/ ineffective ventilation and posturing on arrival to ED. She was placed under the hypothermia protocol. On 06/29/2014 she was rewarmed and extubated but remained lethargic and nonverbal with concerns of anoxic  encephalopathy.   Pt was admitted to [facility] on 07/07/14 for further management. Pt's cognition has improved slowly and she has been participating well with therapies. Inpatient rehab was recommended by team at [place] and pt was medically cleared for inpatient rehab admission on 07/29/14.  Patient's medical record from [facility] has been reviewed by the rehabilitation admission coordinator and physician.  Past Medical History  Past Medical History  Diagnosis Date  . PAD (peripheral artery disease)     a. L BKA  . Type II diabetes mellitus   . Anemia   . History of blood transfusion 2002    "related to amputation" (08/13/2013)  . End stage renal disease on dialysis     "[facility]; M, W, F" (08/13/2013)  . DVT (deep venous thrombosis)     a. R IJ DVT 08/2013.  Marland Kitchen CVA (cerebral infarction)     a. MRI 08/2013: possible left motor strip infarct with remote pontine infarct.  . Pulmonary HTN     a. Cath 2011 - mod to severe pulmonary hypertension. b. Echo 08/2013: PAP 78mHg (mild-mod increased).  . Thrombocytopenia   . CAD (coronary artery disease)     a. Severe diffuse diabetic disease by cath 2011, for med rx, not a candidate for CABG or PCI at that time.  . Aortic stenosis     mild  . Syncope     a. 2011, etiology never clear.  . Carotid artery disease     a. Dopp 168/3419 16-22%LICA< 629-79%RICA borderline >80%.  . Cerebrovascular disease     a. 08/2013 MRA brain: multiple bilateral moderate and high grade intracranial stenoses  . Cardiac arrest 08/14/13    a. Possible arrest in setting of hypotension during dialysis with possible arrest requiring brief CPR, not intubated and was  fully intact after episode. Suspected due to low cerebral perfusion in setting of hypotension.  Unclear if any arrhythmia.  . End stage renal disease on dialysis   . Peripheral arterial occlusive disease   . Hypertension   . Diabetes mellitus type 2, uncontrolled, with complications   . DVT (deep venous thrombosis)     Right internal jugular vein  . CAD (coronary artery disease), native coronary artery 2011    Diffuse distal vessel CAD noted at cardiac catheterization  . MI (myocardial infarction)   . Stroke     Family History  family history includes Heart attack in her brother; Hypertension in her father and mother; Pulmonary embolism in her father; Stroke in her mother.  Prior Rehab/Hospitalizations: pt has been to our inpatient rehab unit in February 2015. Pt has had follow up home health services and has been to skilled nursing in the past. She also had previous rehab years ago following her L BKA surgery and follow up rehab with her L LE prosthesis.  Current Medications See MAR  Patients Current Diet: Renal diet  Precautions / Restrictions Precautions Precautions: Fall (previous L BKA, tender L UE AV dialysis site) Restrictions Weight Bearing Restrictions: No   Prior Activity Level Community (5-7x/wk): Pt was independent prior to admit, driving and enjoys going to [employer]. Pt lives with her family and three schoolage grandchildren.   Home Assistive Devices / Equipment    Prior Functional Level Current Functional Level  Bed Mobility  Independent  Mod assist   Transfers  Independent  Max assist (Mod to max assist stand pivot transfers using L LE prosthesi)   Mobility - Walk/Wheelchair  Independent  Other (not assessed at this time)   Upper Body Dressing  Independent  Min assist   Lower Body Dressing  Independent  Max assist   Grooming  Independent  Other (set up assist with pt mostly using R UE)   Eating/Drinking   Independent  Other (set up assistance)   Toilet Transfer  Independent  Other (not assessed, anticipate needs. Pt has been using bedpan)   Bladder Continence   WFL  pt does not urinate per RN    Bowel Management  WFL  using bedpan, last BM on 07-28-14 (small per Rn) OT noted that pt did perform peri-care in long sitting at [facility].  Stair Catering manager  Other (not assessed at this time, anticipate needs)   Communication  [facility]  [facility]   Memory  [facility]  WFL   Cooking/Meal Prep  Independent     Housework  Independent, though previous Epic records indicate pt had a housekeeper?    Money Management  Independent    Driving   Independent    Special needs/care consideration BiPAP/CPAP no  CPM no  Continuous Drip IV no Dialysis - yes Days - M-W-F   Note: I called HD unit to request a later HD time on dialysis days to accommodate therapy schedule.  Note: per records at [facility], pt has a new tunneled dialysis catheter in the left chest and this is currently being used as her L UE AV fistula site is tender and edematous.  Life Vest no Oxygen no  Special Bed no Trach Size no Wound Vac (area) no  Skin - pt has previous sacral stage II area upon admit to [facility] but Rn states that pt has no current skin issues. Bowel mgmt: last BM on 07-28-14 (small per RN) Bladder mgmt: pt does not urinate per RN Diabetic mgmt -  yes, managed at home with insulin  Previous Home Environment Living Arrangements: Other (Comment) (pt lives with her dtr and school age grandchildren) Lives With: Family Available Help at Discharge: Family, Available 24 hours/day Type of Home: House Home Layout: One level Home Access: Ramped entrance Bathroom Shower/Tub: Multimedia programmer: Handicapped height Bathroom Accessibility: Yes How Accessible: Accessible via walker, Accessible via  wheelchair [place]: No Additional Comments: Rn shared that pt's husband recently passed away.  Discharge Living Setting Plans for Discharge Living Setting: Patient's home Type of Home at Discharge: House Discharge Home Layout: One level Discharge Home Access: Ramped entrance Discharge Bathroom Shower/Tub: Walk-in shower Discharge Bathroom Toilet: Handicapped height Discharge Bathroom Accessibility: Yes  Social/Family/Support Systems Patient Roles: Other (Comment) (involved Grandmother and active in church) Contact Information: Dtr Hinton Dyer is primary contact Anticipated Caregiver: Dtr Hinton Dyer, brother and sister-in-law Anticipated Caregiver's Contact Information: see above Ability/Limitations of Caregiver: dtr does work and family will pull together to provide needed help. Brother and sister-in-law plan on coming to help. Caregiver Availability: 24/7 Discharge Plan Discussed with Primary Caregiver: Yes Is Caregiver In Agreement with Plan?: Yes Does Caregiver/Family have Issues with Lodging/Transportation while Pt is in Rehab?: No  Goals/Additional Needs Patient/Family Goal for Rehab: Min assist and supervision with PT/OT; NA for SLP Expected length of stay: 10-14 days Cultural Considerations: Pt is active in her church and goes to Bible studies. Dietary Needs: renal diet. Equipment Needs: to be determined Pt/Family Agrees to Admission and willing to participate: Yes Program Orientation Provided & Reviewed with Pt/Caregiver Including Roles & Responsibilities: Yes  Patient Condition: I met with pt and her brother/sister-in-law at [facility] on 07-28-14 and 07-29-14. I called and left message with pt's dtr Hinton Dyer by phone. Pt and family were already familiar with our rehab program and wanted to pursue inpatient rehab to maximize her independence. This 68 year old patient was previously independent prior to her cardiac arrest event during hemodialysis. She was very  active in her family and church. Pt has had an extensive medical course and is currently needing moderate to maximal assistance with bed mobility and limited transfers. Further ambulation has not yet been assessed. In addition, pt is requiring minimal to maximal assistance with self care skills. Her cognition has markedly improved and she has been participating well with therapies. Pt will benefit greatly from the multi-disciplinary team of skilled PT, OT and rehab nursing to focus on increasing strength for greater independence in bed mobility, transfers, ambulation and self care skills. Rehab nursing will also focus on further pt/family education in regard to medication management and dialysis/diabetes teaching. In addition, pt will benefit from physician intervention to monitor her medical status following her involved medical course with cardiac arrest, respiratory failure and encephalopathy. Discussed case with Dr. Naaman Plummer, who was familiar with the pt from previous admission, and MD stated that pt is a good inpatient rehab candidate. We received medical clearance from Dr. Laren Everts from [facility]. Pt and her family are motivated to come to inpatient rehab and will benefit from the intensive services of skilled therapy under rehab physician guidance. Pt will be admitted today on 07-29-14.   Preadmission Screen Completed By: Nanetta Batty, PT, 07/29/2014 11:29 AM ______________________________________________________________________  Discussed status with Dr. Naaman Plummer on 07-29-14 at 1129 and received telephone approval for admission today.  Admission Coordinator: Nanetta Batty, PT, time 1129/Date 07-29-14   Assessment/Plan: Diagnosis: deconditioning after multiple medical issues 1. Does the need for close, 24 hr/day Medical supervision in concert  with the patient's rehab needs make it unreasonable for this patient to be served in a less intensive setting? Yes 2. Co-Morbidities requiring  supervision/potential complications: dm, hx of cva, esrd 3. Due to bladder management, bowel management, safety, skin/wound care, disease management, medication administration, pain management and patient education, does the patient require 24 hr/day rehab nursing? Yes 4. Does the patient require coordinated care of a physician, rehab nurse, PT (1-2 hrs/day, 5 days/week) and OT (1-2 hrs/day, 5 days/week) to address physical and functional deficits in the context of the above medical diagnosis(es)? Yes Addressing deficits in the following areas: balance, endurance, locomotion, strength, transferring, bowel/bladder control, bathing, dressing, feeding, grooming and toileting 5. Can the patient actively participate in an intensive therapy program of at least 3 hrs of therapy 5 days a week? Yes 6. The potential for patient to make measurable gains while on inpatient rehab is excellent 7. Anticipated functional outcomes upon discharge from inpatients are: supervision and min assist PT, supervision and min assist OT, n/a SLP 8. Estimated rehab length of stay to reach the above functional goals is: 10-14 days 9. Does the patient have adequate social supports to accommodate these discharge functional goals? Yes 10. Anticipated D/C setting: Home 11. Anticipated post D/C treatments: [place] therapy 12. Overall Rehab/Functional Prognosis: excellent    RECOMMENDATIONS: This patient's condition is appropriate for continued rehabilitative care in the following setting: CIR Patient has agreed to participate in recommended program. Yes Note that insurance prior authorization may be required for reimbursement for recommended care.  Comment: admit to rehab today.  Meredith Staggers, MD, [place] [facility] 07/29/2014   Nanetta Batty, PT 07/29/2014

## 2014-08-01 NOTE — Care Management Note (Signed)
Inpatient Rehabilitation Center Individual Statement of Services  Patient Name:  Briana Gould  Date:  08/01/2014  Welcome to the Inpatient Rehabilitation Center.  Our goal is to provide you with an individualized program based on your diagnosis and situation, designed to meet your specific needs.  With this comprehensive rehabilitation program, you will be expected to participate in at least 3 hours of rehabilitation therapies Monday-Friday, with modified therapy programming on the weekends.  Your rehabilitation program will include the following services:  Physical Therapy (PT), Occupational Therapy (OT), Speech Therapy (ST), 24 hour per day rehabilitation nursing, Neuropsychology, Case Management (Social Worker), Rehabilitation Medicine, Nutrition Services and Pharmacy Services  Weekly team conferences will be held on Wednesday to discuss your progress.  Your Social Worker will talk with you frequently to get your input and to update you on team discussions.  Team conferences with you and your family in attendance may also be held.  Expected length of stay: 14-17 days Overall anticipated outcome: mod/i-some min-ADL's  Depending on your progress and recovery, your program may change. Your Social Worker will coordinate services and will keep you informed of any changes. Your Social Worker's name and contact numbers are listed  below.  The following services may also be recommended but are not provided by the Inpatient Rehabilitation Center:   Driving Evaluations  Home Health Rehabiltiation Services  Outpatient Rehabilitation Services   Arrangements will be made to provide these services after discharge if needed.  Arrangements include referral to agencies that provide these services.  Your insurance has been verified to be:  Medicare & Tricare Your primary doctor is:  Margaretmary Bayleyreston Clark  Pertinent information will be shared with your doctor and your insurance company.  Social Worker:   Dossie DerBecky Colsen Modi, SW [phone] or [phone]  Information discussed with and copy given to patient by: Lucy Chrisupree, Hokulani Rogel G, 08/01/2014, 11:01 AM

## 2014-08-01 NOTE — Evaluation (Signed)
Speech Language Pathology Assessment and Plan  Patient Details  Name: Briana Gould MRN: [id number] Date of Birth: 1945-09-25  SLP Diagnosis: Cognitive Impairments  Rehab Potential: Good ELOS: 14-17 days     Today's Date: 08/01/2014 SLP Individual Time: 0800-0900 SLP Individual Time Calculation (min): 60 min   Problem List:  Patient Active Problem List   Diagnosis Date Noted  . Critical illness myopathy 07/29/2014  . HCAP (healthcare-associated pneumonia) 12/12/2013  . Physical deconditioning 10/15/2013  . CVA (cerebral infarction) 10/01/2013  . Paroxysmal a-fib 10/01/2013  . Cardiac arrest 09/20/2013  . Acute respiratory failure with hypoxia 09/20/2013  . Hypertension 09/20/2013  . Encephalopathy acute 09/20/2013  . Bradycardia- intol to beta blocker 09/03/2013  . Anemia of chronic renal failure 08/30/2013  . Secondary hyperparathyroidism 08/30/2013  . GIB (gastrointestinal bleeding)--recurrent- not a candidate for Plavix 08/29/2013  . CAD- severe 3V CAD at cath March 2015- medical Rx 08/16/2013  . History of syncope 08/16/2013  . Aortic stenosis 08/16/2013  . 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 Cerebrovascular disease 08/15/2013  . Thrombocytopenia 08/14/2013  . Seizure 08/13/2013  . PAD (peripheral artery disease) 08/13/2013  . Weakness generalized 11/08/2011  . ESRD (end stage renal disease) on dialysis 11/04/2011  . MITRAL REGURGITATION 10/04/2010  . AORTIC STENOSIS 10/04/2010  . 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 HYPERLIPIDEMIA 09/13/2010  . OBESITY 09/13/2010  . Essential hypertension 09/13/2010  . PULMONARY HYPERTENSION 09/13/2010  . Status post below knee amputation 09/13/2010   Past Medical History:  Past Medical History  Diagnosis Date  . PAD (peripheral artery disease)     a. L BKA  . Type II diabetes mellitus   . Anemia   . History of blood transfusion 2002    "related to amputation" (08/13/2013)  . End stage renal  disease on dialysis     "[facility]; M, W, F" (08/13/2013)  . DVT (deep venous thrombosis)     a. R IJ DVT 08/2013.  Marland Kitchen CVA (cerebral infarction)     a. MRI 08/2013: possible left motor strip infarct with remote pontine infarct.  . Pulmonary HTN     a. Cath 2011 - mod to severe pulmonary hypertension. b. Echo 08/2013: PAP 87mHg (mild-mod increased).  . Thrombocytopenia   . CAD (coronary artery disease)     a. Severe diffuse diabetic disease by cath 2011, for med rx, not a candidate for CABG or PCI at that time.  . Aortic stenosis     mild  . Syncope     a. 2011, etiology never clear.  . Carotid artery disease     a. Dopp 114/4818 15-63%LICA< 614-97%RICA borderline >80%.  . Cerebrovascular disease     a. 08/2013 MRA brain: multiple bilateral moderate and high grade intracranial stenoses  . Cardiac arrest 08/14/13    a. Possible arrest in setting of hypotension during dialysis with possible arrest requiring brief CPR, not intubated and was fully intact after episode. Suspected due to low cerebral perfusion in setting of hypotension. Unclear if any arrhythmia.  . End stage renal disease on dialysis   . Peripheral arterial occlusive disease   . Hypertension   . Diabetes mellitus type 2, uncontrolled, with complications   . DVT (deep venous thrombosis)      Right internal jugular vein  . CAD (coronary artery disease), native coronary artery 2011    Diffuse distal vessel CAD noted at cardiac catheterization  . MI (myocardial infarction)   .  Stroke    Past Surgical History:  Past Surgical History  Procedure Laterality Date  . Av fistula placement Left 2011    upper arm  . Below knee leg amputation Left 2002  . Tonsillectomy    . Cataract extraction w/ intraocular lens  implant, bilateral Bilateral 2012-2014  . Esophagogastroduodenoscopy N/A 08/30/2013    Procedure: ESOPHAGOGASTRODUODENOSCOPY (EGD);  Surgeon: Beryle Beams, MD;  Location: [facility] ENDOSCOPY;  Service:  Endoscopy;  Laterality: N/A;  . Below knee leg amputation Left   . Av fistula placement Left     Assessment / Plan / Recommendation Clinical Impression 68 year old female with h/o severe multivessel CAD (not candidate for percutaneous intervention), ESRD on HD, and PAD s/p R- BKA, h/o prior cardiac arrest X 2; who was admitted to on 06/27/2014 due to cardiopulmonary arrest past having HD cath placed at outpatient center. She received CPR for 2 minutes with return of spontaneous circulation and was unresponsive w/ ineffective ventilation and posturing on arrival to ED. She was placed under the hypothermia protocol. 2D echo with EF 45-50% with mild LVF and elevated pulmonary pressures. On 06/29/2014 she was rewarmed and extubated but remained lethargic and nonverbal with concerns of anoxic encephalopathy. CT head negative. EEG done showing diffuse cerebral disturbance--non-specific. CXR on 10/22 with LLL HCAP and patient has completed her antibiotic course. Cardiology consulted for input and recommended medical treatment as EKG and troponin negative. PCCM felt that arrest most likely due to baseline hypotension and pulmonary hypertension. Swallow evaluation with evidence on dysphagia and patient placed on Dys. 3 textures with nectar-thick liquids with compensatory strategies. She was noted to be significantly deconditioned and was transferred to [facility] on 10/29. HD has been ongoing on MWF with midodrine and IV albumin to assist with BP support. BB changed to non-dialysis days with HR controlled and ABLA being monitored.  L-AVF has infiltrated with hematoma that's slowly resolving. Dialysis catheter exchanged by IVR on 11/9. Stage 2 sacral decub covered with foam dressing and right heel with unstable ulcer. Endurance improving and diet advanced to regular thin with chin tuck on 11/17. Therapy ongoing and patient showing increased tolerance. MD and therapy team recommending CIR for follow up therapy. Patient  admitted to CIR on 07/29/14 and was administered a cognitive-linguistic evaluation and demonstrates cognitive impairments characterized by decreased recall of new information, problem solving and awareness which impacts her safety with functional and familiar tasks. Patient would benefit from skilled SLP intervention to maximize her cognitive-linguistic function and overall functional independence prior to discharge.   Skilled Therapeutic Interventions          Administered a cognitive-linguistic evaluation, please see above for details. Patient educated in regards to her current cognitive function and goals of skilled SLP intervention, she verbalized understanding.   SLP Assessment  Patient will need skilled [place] [facility] during CIR admission    Recommendations  Patient destination: Home Follow up Recommendations: 24 hour supervision/assistance (TBD) Equipment Recommended: None recommended by SLP    SLP Frequency 5 out of 7 days   SLP Treatment/Interventions Cognitive remediation/compensation;Cueing hierarchy;Functional tasks;Patient/family education;Therapeutic Activities;Internal/external aids;Environmental controls    Pain Pain Assessment Pain Assessment: No/denies pain  Short Term Goals: Week 1: SLP Short Term Goal 1 (Week 1): Pt will tolerate her currently prescribed diet with no overt s/s of aspiration and supervision cues for use of swallowing precautions.  SLP Short Term Goal 2 (Week 1): Patient will utilize external memory aids to recall new, daily information with supervision multimodal cues.  SLP Short Term Goal 3 (Week 1): Patient will demonstrate functional problem solving for mildly complex tasks with supervision multimodal cues.  SLP Short Term Goal 4 (Week 1): Patient will self-monitor and correct errors during functional tasks with supervision question cues.   See FIM for current functional status Refer to Care Plan for Long Term  Goals  Recommendations for other services: Neuropsych  Discharge Criteria: Patient will be discharged from SLP if patient refuses treatment 3 consecutive times without medical reason, if treatment goals not met, if there is a change in medical status, if patient makes no progress towards goals or if patient is discharged from hospital.  The above assessment, treatment plan, treatment alternatives and goals were discussed and mutually agreed upon: by patient  Caniyah Murley 08/01/2014, 3:17 PM

## 2014-08-01 NOTE — Progress Notes (Signed)
Subjective/Complaints: 68 year old female with h/o severe multivessel CAD (not candidate for percutaneous intervention), ESRD on HD, and PAD s/p R- BKA, h/o prior cardiac arrest X 2; who was admitted to on 06/27/2014 due to cardiopulmonary arrest past having HD cath placed at outpatient center. She received CPR for 2 minutes with return of spontaneous circulation and was unresponsive w/ ineffective ventilation and posturing on arrival to ED. She was placed under the hypothermia protocol. 2D echo with EF 45-50% with mild LVF and elevated pulmonary pressures. On 06/29/2014 she was rewarmed and extubated but remained lethargic and nonverbal with concerns of anoxic encephalopathy. CT head negative. EEG done showing diffuse cerebral disturbance--non-specific. CXR on 10/22 with LLL HCAP and patient has completed her antibiotic course. Cardiology consulted for input and recommended medical treatment as EKG and troponin negative. PCCM felt that arrest most likely due to baseline hypotension and pulmonary hypertension. A fib treated with IV Cardizem with recommendations to transition to oral BB with close monitoring as due to patient's h/o hypotension past HD. Swallow evaluation with evidence on dysphagia and patient placed on D3  Nectar liquids   Pt gives hx of internal bleeding last year while on Lovenox, has been refusing.  Using SCDs, mobilizing with therapy  Hgb lower today Objective: Vital Signs: Blood pressure 116/48, pulse 68, temperature 99.3 F (37.4 C), temperature source Oral, resp. rate 18, weight 84.2 kg (185 lb 10 oz), SpO2 100 %. No results found. Results for orders placed or performed during the hospital encounter of 07/29/14 (from the past 72 hour(s))  Glucose, capillary     Status: Abnormal   Collection Time: 07/29/14  6:00 PM  Result Value Ref Range   Glucose-Capillary 233 (H) 70 - 99 mg/dL  Glucose, capillary     Status: Abnormal   Collection Time: 07/29/14  8:50 PM  Result Value  Ref Range   Glucose-Capillary 143 (H) 70 - 99 mg/dL  Glucose, capillary     Status: Abnormal   Collection Time: 07/30/14  6:51 AM  Result Value Ref Range   Glucose-Capillary 156 (H) 70 - 99 mg/dL  Glucose, capillary     Status: Abnormal   Collection Time: 07/30/14 11:10 AM  Result Value Ref Range   Glucose-Capillary 230 (H) 70 - 99 mg/dL  Glucose, capillary     Status: Abnormal   Collection Time: 07/30/14 12:57 PM  Result Value Ref Range   Glucose-Capillary 202 (H) 70 - 99 mg/dL  Glucose, capillary     Status: Abnormal   Collection Time: 07/30/14  5:30 PM  Result Value Ref Range   Glucose-Capillary 211 (H) 70 - 99 mg/dL  Glucose, capillary     Status: Abnormal   Collection Time: 07/30/14  9:36 PM  Result Value Ref Range   Glucose-Capillary 183 (H) 70 - 99 mg/dL   Comment 1 Notify RN   Glucose, capillary     Status: Abnormal   Collection Time: 07/31/14  6:45 AM  Result Value Ref Range   Glucose-Capillary 175 (H) 70 - 99 mg/dL  Glucose, capillary     Status: Abnormal   Collection Time: 07/31/14 12:03 PM  Result Value Ref Range   Glucose-Capillary 208 (H) 70 - 99 mg/dL  CBC     Status: Abnormal   Collection Time: 07/31/14  4:09 PM  Result Value Ref Range   WBC 9.9 4.0 - 10.5 K/uL   RBC 2.62 (L) 3.87 - 5.11 MIL/uL   Hemoglobin 8.1 (L) 12.0 - 15.0 g/dL   HCT  25.7 (L) 36.0 - 46.0 %   MCV 98.1 78.0 - 100.0 fL   MCH 30.9 26.0 - 34.0 pg   MCHC 31.5 30.0 - 36.0 g/dL   RDW 19.7 (H) 11.5 - 15.5 %   Platelets 208 150 - 400 K/uL  Renal function panel     Status: Abnormal   Collection Time: 07/31/14  4:09 PM  Result Value Ref Range   Sodium 133 (L) 137 - 147 mEq/L   Potassium 4.6 3.7 - 5.3 mEq/L   Chloride 91 (L) 96 - 112 mEq/L   CO2 24 19 - 32 mEq/L   Glucose, Bld 226 (H) 70 - 99 mg/dL   BUN 58 (H) 6 - 23 mg/dL   Creatinine, Ser 5.44 (H) 0.50 - 1.10 mg/dL   Calcium 9.2 8.4 - 10.5 mg/dL   Phosphorus 2.9 2.3 - 4.6 mg/dL   Albumin 3.1 (L) 3.5 - 5.2 g/dL   GFR calc non Af Amer 7  (L) >90 mL/min   GFR calc Af Amer 8 (L) >90 mL/min    Comment: (NOTE) The eGFR has been calculated using the CKD EPI equation. This calculation has not been validated in all clinical situations. eGFR's persistently <90 mL/min signify possible Chronic Kidney Disease.    Anion gap 18 (H) 5 - 15  Glucose, capillary     Status: Abnormal   Collection Time: 07/31/14  7:58 PM  Result Value Ref Range   Glucose-Capillary 104 (H) 70 - 99 mg/dL  Glucose, capillary     Status: Abnormal   Collection Time: 08/01/14  6:36 AM  Result Value Ref Range   Glucose-Capillary 157 (H) 70 - 99 mg/dL     HEENT: normal Cardio: RRR and no murmur Resp: CTA B/L and unlabored GI: BS positive and NT, ND Extremity:  Pulses positive and No Edema Skin:   Intact Neuro: Alert/Oriented Musc/Skel:  Swelling Left arm, with good thrill at AV graft site Gen NAD   Assessment/Plan: 1. Functional deficits secondary to Critical illness myopathy which require 3+ hours per day of interdisciplinary therapy in a comprehensive inpatient rehab setting. Physiatrist is providing close team supervision and 24 hour management of active medical problems listed below. Physiatrist and rehab team continue to assess barriers to discharge/monitor patient progress toward functional and medical goals. FIM: FIM - Bathing Bathing Steps Patient Completed: Chest, Right Arm, Left Arm, Abdomen, Right upper leg, Left upper leg, Front perineal area Bathing: 3: Mod-Patient completes 5-7 57f10 parts or 50-74%  FIM - Upper Body Dressing/Undressing Upper body dressing/undressing steps patient completed: Thread/unthread right sleeve of pullover shirt/dresss, Put head through opening of pull over shirt/dress, Thread/unthread left sleeve of pullover shirt/dress, Pull shirt over trunk, Thread/unthread left bra strap, Thread/unthread right bra strap Upper body dressing/undressing: 4: Min-Patient completed 75 plus % of tasks FIM - Lower Body  Dressing/Undressing Lower body dressing/undressing: 1: Total-Patient completed less than 25% of tasks  FIM - Toileting Toileting steps completed by patient: Performs perineal hygiene Toileting: 0: Activity did not occur  FIM - TRadio producerDevices: Grab bars Toilet Transfers: 0-Activity did not occur  FIM - BControl and instrumentation engineerDevices: Sliding board, Arm rests Bed/Chair Transfer: 4: Sit > Supine: Min A (steadying pt. > 75%/lift 1 leg), 4: Supine > Sit: Min A (steadying Pt. > 75%/lift 1 leg), 3: Chair or W/C > Bed: Mod A (lift or lower assist)  FIM - Locomotion: Wheelchair Distance: 40' Locomotion: Wheelchair: 1: Travels less than 50 ft  with supervision, cueing or coaxing FIM - Locomotion: Ambulation Locomotion: Ambulation: 0: Activity did not occur  Comprehension Comprehension Mode: Auditory Comprehension: 5-Understands basic 90% of the time/requires cueing < 10% of the time  Expression Expression Mode: Verbal Expression: 5-Expresses basic 90% of the time/requires cueing < 10% of the time.  Social Interaction Social Interaction: 4-Interacts appropriately 75 - 89% of the time - Needs redirection for appropriate language or to initiate interaction.  Problem Solving Problem Solving: 4-Solves basic 75 - 89% of the time/requires cueing 10 - 24% of the time  Memory Memory: 4-Recognizes or recalls 75 - 89% of the time/requires cueing 10 - 24% of the time  Medical Problem List and Plan: 1. Functional deficits secondary to Critical illness myopathy.   2.  DVT Prophylaxis/Anticoagulation: Pharmaceutical: Lovenox, pt refusing drop in Hgb will check stool guaic and hold, use SCDs 3. Pain Management: Prn oxycodone.   4. Mood: LCSW to follow for evaluation and support. Team to provide egosupport as well.   5. Neuropsych: This patient is capable of making decisions on her own behalf. 6. Skin/Wound Care: continue foam dressing to  sacrum. Order Prevalon boot for right foot as well as elevation when in bed.   7. Fluids/Electrolytes/Nutrition: Strict I/O. 1200 FR due to ESRD. 8. ESRD: HD after therapy day. Nephrology to manage dialysis,               -AVG site tender but functioning, no signs of infection,monitor 9. CAD: metoprolol, ASA, monitor clinically with functional activities 10. A fib: rate controlled at present. Continue metoprolol   12. DM type II: sliding scale insulin. Check cbg's ac and hs             -on amaryl at home   LOS (Days) 3 A FACE TO FACE EVALUATION WAS PERFORMED  Lerline Valdivia E 08/01/2014, 8:48 AM

## 2014-08-02 ENCOUNTER — Inpatient Hospital Stay (HOSPITAL_COMMUNITY): Payer: BC Managed Care – PPO | Admitting: *Deleted

## 2014-08-02 ENCOUNTER — Inpatient Hospital Stay (HOSPITAL_COMMUNITY): Payer: Medicare Other | Admitting: Occupational Therapy

## 2014-08-02 ENCOUNTER — Encounter (HOSPITAL_COMMUNITY): Payer: Self-pay | Admitting: Physical Medicine and Rehabilitation

## 2014-08-02 ENCOUNTER — Ambulatory Visit: Payer: Medicare Other | Admitting: Cardiovascular Disease

## 2014-08-02 ENCOUNTER — Inpatient Hospital Stay (HOSPITAL_COMMUNITY): Payer: Medicare Other | Admitting: *Deleted

## 2014-08-02 ENCOUNTER — Inpatient Hospital Stay (HOSPITAL_COMMUNITY): Payer: BC Managed Care – PPO | Admitting: Speech Pathology

## 2014-08-02 LAB — GLUCOSE, CAPILLARY
GLUCOSE-CAPILLARY: 255 mg/dL — AB (ref 70–99)
GLUCOSE-CAPILLARY: 119 mg/dL — AB (ref 70–99)
Glucose-Capillary: 209 mg/dL — ABNORMAL HIGH (ref 70–99)
Glucose-Capillary: 225 mg/dL — ABNORMAL HIGH (ref 70–99)

## 2014-08-02 LAB — CBC
HEMATOCRIT: 26 % — AB (ref 36.0–46.0)
Hemoglobin: 8.3 g/dL — ABNORMAL LOW (ref 12.0–15.0)
MCH: 32.2 pg (ref 26.0–34.0)
MCHC: 31.9 g/dL (ref 30.0–36.0)
MCV: 100.8 fL — AB (ref 78.0–100.0)
Platelets: 235 10*3/uL (ref 150–400)
RBC: 2.58 MIL/uL — ABNORMAL LOW (ref 3.87–5.11)
RDW: 19.8 % — AB (ref 11.5–15.5)
WBC: 9.6 10*3/uL (ref 4.0–10.5)

## 2014-08-02 LAB — RENAL FUNCTION PANEL
ALBUMIN: 3.2 g/dL — AB (ref 3.5–5.2)
Anion gap: 18 — ABNORMAL HIGH (ref 5–15)
BUN: 52 mg/dL — AB (ref 6–23)
CALCIUM: 9.6 mg/dL (ref 8.4–10.5)
CO2: 25 mEq/L (ref 19–32)
Chloride: 94 mEq/L — ABNORMAL LOW (ref 96–112)
Creatinine, Ser: 5.09 mg/dL — ABNORMAL HIGH (ref 0.50–1.10)
GFR calc Af Amer: 9 mL/min — ABNORMAL LOW (ref >90)
GFR calc non Af Amer: 8 mL/min — ABNORMAL LOW (ref >90)
Glucose, Bld: 81 mg/dL (ref 70–99)
PHOSPHORUS: 2.8 mg/dL (ref 2.3–4.6)
Potassium: 4.4 mEq/L (ref 3.7–5.3)
Sodium: 137 mEq/L (ref 137–147)

## 2014-08-02 LAB — PREPARE RBC (CROSSMATCH)

## 2014-08-02 MED ORDER — SODIUM CHLORIDE 0.9 % IV SOLN: Freq: Once | INTRAVENOUS | Status: DC

## 2014-08-02 MED ORDER — ALBUMIN HUMAN 25 % IV SOLN
25.0000 g | Freq: Once | INTRAVENOUS | Status: AC
Start: 2014-08-02 — End: 2014-08-02
  Administered 2014-08-02: 25 g via INTRAVENOUS

## 2014-08-02 MED ORDER — DARBEPOETIN ALFA 150 MCG/0.3ML IJ SOSY
PREFILLED_SYRINGE | INTRAMUSCULAR | Status: AC
  Administered 2014-08-02: 150 ug via INTRAVENOUS
  Filled 2014-08-02: qty 0.3

## 2014-08-02 MED ORDER — SODIUM CHLORIDE 0.9 % IV SOLN
62.5000 mg | INTRAVENOUS | Status: DC
  Administered 2014-08-10 – 2014-08-17 (×2): 62.5 mg via INTRAVENOUS
  Filled 2014-08-02 (×4): qty 5

## 2014-08-02 MED ORDER — ALBUMIN HUMAN 25 % IV SOLN
INTRAVENOUS | Status: AC
  Administered 2014-08-02: 25 g via INTRAVENOUS
  Filled 2014-08-02: qty 200

## 2014-08-02 MED ORDER — DOCUSATE SODIUM 100 MG PO CAPS
100.0000 mg | ORAL_CAPSULE | Freq: Two times a day (BID) | ORAL | Status: DC
  Administered 2014-08-02 – 2014-08-19 (×30): 100 mg via ORAL
  Filled 2014-08-02 (×38): qty 1

## 2014-08-02 MED ORDER — METOCLOPRAMIDE HCL 10 MG PO TABS
10.0000 mg | ORAL_TABLET | Freq: Three times a day (TID) | ORAL | Status: DC
  Administered 2014-08-02 – 2014-08-19 (×60): 10 mg via ORAL
  Filled 2014-08-02 (×72): qty 1

## 2014-08-02 MED ORDER — ALBUMIN HUMAN 25 % IV SOLN
25.0000 g | Freq: Once | INTRAVENOUS | Status: AC
  Administered 2014-08-02: 25 g via INTRAVENOUS

## 2014-08-02 MED ORDER — GLIMEPIRIDE 2 MG PO TABS
2.0000 mg | ORAL_TABLET | Freq: Every day | ORAL | Status: DC
  Administered 2014-08-02 – 2014-08-17 (×15): 2 mg via ORAL
  Filled 2014-08-02 (×21): qty 1

## 2014-08-02 MED ORDER — PANTOPRAZOLE SODIUM 40 MG PO TBEC
40.0000 mg | DELAYED_RELEASE_TABLET | Freq: Every day | ORAL | Status: DC
  Administered 2014-08-02 – 2014-08-19 (×18): 40 mg via ORAL
  Filled 2014-08-02 (×17): qty 1

## 2014-08-02 MED ORDER — MIDODRINE HCL 5 MG PO TABS
ORAL_TABLET | ORAL | Status: AC
  Administered 2014-08-02: 5 mg via ORAL
  Filled 2014-08-02: qty 1

## 2014-08-02 MED ORDER — GUAIFENESIN ER 600 MG PO TB12
600.0000 mg | ORAL_TABLET | Freq: Two times a day (BID) | ORAL | Status: DC
  Administered 2014-08-02 – 2014-08-06 (×7): 600 mg via ORAL
  Filled 2014-08-02 (×13): qty 1

## 2014-08-02 MED ORDER — DOXERCALCIFEROL 4 MCG/2ML IV SOLN
INTRAVENOUS | Status: AC
  Administered 2014-08-02: 5 ug via INTRAVENOUS
  Filled 2014-08-02: qty 4

## 2014-08-02 MED ORDER — SODIUM CHLORIDE 0.9 % IV SOLN
62.5000 mg | INTRAVENOUS | Status: AC
  Administered 2014-08-02: 62.5 mg via INTRAVENOUS
  Filled 2014-08-02: qty 5

## 2014-08-02 MED ORDER — TRAZODONE HCL 50 MG PO TABS
25.0000 mg | ORAL_TABLET | Freq: Every day | ORAL | Status: DC
  Administered 2014-08-02: 25 mg via ORAL
  Filled 2014-08-02: qty 1

## 2014-08-02 NOTE — Progress Notes (Signed)
Physical Therapy Session Note  Patient Details  Name: Briana Gould MRN: [id number] Date of Birth: [name] 10, 1947  Today's Date: 08/02/2014 PT Individual Time: 8:00-9:05 (65min)      Short Term Goals: Week 1:  PT Short Term Goal 1 (Week 1): Pt will demonstrate supine to sit transfer with SBA PT Short Term Goal 2 (Week 1): Pt will scoot transfer with SB req mod A.  PT Short Term Goal 3 (Week 1): Pt will self propel manual w/c x 100' req SBA.  PT Short Term Goal 4 (Week 1): Pt will initiate ambulate with PT during session.  PT Short Term Goal 5 (Week 1): Pt will demonstrate sit to supine transfer with SBA.   Skilled Therapeutic Interventions/Progress Updates:  Tx focused on functional mobility training, sit<>stand attempts, therex for LE/core strength, and WC propulsion. Pt in gown, so did not attempt sliding board transfers this morning. Wrapped residual limb after failed attempt at sleeve/prothesis due to swelling. NT assisting to find appropriate size TEDs for RLE.   Instructed pt in seated LE threx bil for marching, LAQ, and ankle pump on RLE x20 with encouragement to continue. Pt performed x10 PNF diagonals with red weighted ball for core strengthening with back unsupported edge of chair.   WC propulsion x40' with Min A for steering. Pt needing cues for technique and encouragement to continue, distance limited by fatigue, increased time required   Instructed pt in sit<>stand transfers at // bars. Pt required +2 lifting assist to partial stand 2x10 sec with max encouragement. Therapist blocking/guarding at knees for safety.   Pt left up in [facility] with all needs in reach.      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

## 2014-08-02 NOTE — Progress Notes (Signed)
Subjective/Complaints: 68 year old female with h/o severe multivessel CAD (not candidate for percutaneous intervention), ESRD on HD, and PAD s/p R- BKA, h/o prior cardiac arrest X 2; who was admitted to on 06/27/2014 due to cardiopulmonary arrest past having HD cath placed at outpatient center. She received CPR for 2 minutes with return of spontaneous circulation and was unresponsive w/ ineffective ventilation and posturing on arrival to ED. She was placed under the hypothermia protocol. 2D echo with EF 45-50% with mild LVF and elevated pulmonary pressures. On 06/29/2014 she was rewarmed and extubated but remained lethargic and nonverbal with concerns of anoxic encephalopathy. CT head negative. EEG done showing diffuse cerebral disturbance--non-specific. CXR on 10/22 with LLL HCAP and patient has completed her antibiotic course. Cardiology consulted for input and recommended medical treatment as EKG and troponin negative. PCCM felt that arrest most likely due to baseline hypotension and pulmonary hypertension. A fib treated with IV Cardizem with recommendations to transition to oral BB with close monitoring as due to patient's h/o hypotension past HD. Swallow evaluation with evidence on dysphagia and patient placed on D3  Nectar liquids   Pt gives hx of internal bleeding last year while on Lovenox, has been refusing.  Using SCDs, mobilizing with therapy  No frank red blood in stool, straining on toilet "for about 4 hrs yesterday" Objective: Vital Signs: Blood pressure 118/60, pulse 71, temperature 98.3 F (36.8 C), temperature source Oral, resp. rate 20, weight 83.9 kg (184 lb 15.5 oz), SpO2 100 %. No results found. Results for orders placed or performed during the hospital encounter of 07/29/14 (from the past 72 hour(s))  Glucose, capillary     Status: Abnormal   Collection Time: 07/30/14 11:10 AM  Result Value Ref Range   Glucose-Capillary 230 (H) 70 - 99 mg/dL  Glucose, capillary     Status:  Abnormal   Collection Time: 07/30/14 12:57 PM  Result Value Ref Range   Glucose-Capillary 202 (H) 70 - 99 mg/dL  Glucose, capillary     Status: Abnormal   Collection Time: 07/30/14  5:30 PM  Result Value Ref Range   Glucose-Capillary 211 (H) 70 - 99 mg/dL  Glucose, capillary     Status: Abnormal   Collection Time: 07/30/14  9:36 PM  Result Value Ref Range   Glucose-Capillary 183 (H) 70 - 99 mg/dL   Comment 1 Notify RN   Glucose, capillary     Status: Abnormal   Collection Time: 07/31/14  6:45 AM  Result Value Ref Range   Glucose-Capillary 175 (H) 70 - 99 mg/dL  Glucose, capillary     Status: Abnormal   Collection Time: 07/31/14 12:03 PM  Result Value Ref Range   Glucose-Capillary 208 (H) 70 - 99 mg/dL  CBC     Status: Abnormal   Collection Time: 07/31/14  4:09 PM  Result Value Ref Range   WBC 9.9 4.0 - 10.5 K/uL   RBC 2.62 (L) 3.87 - 5.11 MIL/uL   Hemoglobin 8.1 (L) 12.0 - 15.0 g/dL   HCT 25.7 (L) 36.0 - 46.0 %   MCV 98.1 78.0 - 100.0 fL   MCH 30.9 26.0 - 34.0 pg   MCHC 31.5 30.0 - 36.0 g/dL   RDW 19.7 (H) 11.5 - 15.5 %   Platelets 208 150 - 400 K/uL  Renal function panel     Status: Abnormal   Collection Time: 07/31/14  4:09 PM  Result Value Ref Range   Sodium 133 (L) 137 - 147 mEq/L   Potassium  4.6 3.7 - 5.3 mEq/L   Chloride 91 (L) 96 - 112 mEq/L   CO2 24 19 - 32 mEq/L   Glucose, Bld 226 (H) 70 - 99 mg/dL   BUN 58 (H) 6 - 23 mg/dL   Creatinine, Ser 5.44 (H) 0.50 - 1.10 mg/dL   Calcium 9.2 8.4 - 10.5 mg/dL   Phosphorus 2.9 2.3 - 4.6 mg/dL   Albumin 3.1 (L) 3.5 - 5.2 g/dL   GFR calc non Af Amer 7 (L) >90 mL/min   GFR calc Af Amer 8 (L) >90 mL/min    Comment: (NOTE) The eGFR has been calculated using the CKD EPI equation. This calculation has not been validated in all clinical situations. eGFR's persistently <90 mL/min signify possible Chronic Kidney Disease.    Anion gap 18 (H) 5 - 15  Glucose, capillary     Status: Abnormal   Collection Time: 07/31/14  7:58 PM   Result Value Ref Range   Glucose-Capillary 104 (H) 70 - 99 mg/dL  Iron and TIBC     Status: Abnormal   Collection Time: 07/31/14 10:40 PM  Result Value Ref Range   Iron 57 42 - 135 ug/dL   TIBC 200 (L) 250 - 470 ug/dL   Saturation Ratios 29 20 - 55 %   UIBC 143 125 - 400 ug/dL    Comment: Performed at [facility]  Glucose, capillary     Status: Abnormal   Collection Time: 08/01/14  6:36 AM  Result Value Ref Range   Glucose-Capillary 157 (H) 70 - 99 mg/dL  Occult blood, poc device     Status: Abnormal   Collection Time: 08/01/14 11:38 AM  Result Value Ref Range   Fecal Occult Bld POSITIVE (A) NEGATIVE  Glucose, capillary     Status: Abnormal   Collection Time: 08/01/14 11:52 AM  Result Value Ref Range   Glucose-Capillary 261 (H) 70 - 99 mg/dL  Occult blood card to lab, stool RN will collect     Status: None   Collection Time: 08/01/14  1:42 PM  Result Value Ref Range   Fecal Occult Bld NEGATIVE NEGATIVE  Glucose, capillary     Status: Abnormal   Collection Time: 08/01/14  4:52 PM  Result Value Ref Range   Glucose-Capillary 227 (H) 70 - 99 mg/dL  Glucose, capillary     Status: Abnormal   Collection Time: 08/01/14  9:49 PM  Result Value Ref Range   Glucose-Capillary 178 (H) 70 - 99 mg/dL   Comment 1 Notify RN   Glucose, capillary     Status: Abnormal   Collection Time: 08/02/14  3:51 AM  Result Value Ref Range   Glucose-Capillary 255 (H) 70 - 99 mg/dL   Comment 1 Notify RN   Glucose, capillary     Status: Abnormal   Collection Time: 08/02/14  7:00 AM  Result Value Ref Range   Glucose-Capillary 209 (H) 70 - 99 mg/dL     HEENT: normal Cardio: RRR and no murmur Resp: CTA B/L and unlabored GI: BS positive and NT, ND Extremity:  Pulses positive and No Edema Skin:   Intact Neuro: Alert/Oriented Musc/Skel:  Swelling Left arm, with good thrill at AV graft site Gen NAD   Assessment/Plan: 1. Functional deficits secondary to Critical illness myopathy which  require 3+ hours per day of interdisciplinary therapy in a comprehensive inpatient rehab setting. Physiatrist is providing close team supervision and 24 hour management of active medical problems listed below. Physiatrist and rehab team  continue to assess barriers to discharge/monitor patient progress toward functional and medical goals. FIM: FIM - Bathing Bathing Steps Patient Completed: Chest, Right Arm, Left Arm, Abdomen, Right upper leg, Left upper leg, Front perineal area Bathing: 3: Mod-Patient completes 5-7 46f10 parts or 50-74%  FIM - Upper Body Dressing/Undressing Upper body dressing/undressing steps patient completed: Thread/unthread right sleeve of pullover shirt/dresss, Put head through opening of pull over shirt/dress, Thread/unthread left sleeve of pullover shirt/dress, Pull shirt over trunk Upper body dressing/undressing: 5: Set-up assist to: Obtain clothing/put away FIM - Lower Body Dressing/Undressing Lower body dressing/undressing: 1: Total-Patient completed less than 25% of tasks  FIM - Toileting Toileting steps completed by patient: Performs perineal hygiene Toileting: 0: Activity did not occur  FIM - TRadio producerDevices: Bedside commode, Sliding board Toilet Transfers: 2-To toilet/BSC: Max A (lift and lower assist)  FIM - BControl and instrumentation engineerDevices: Sliding board, Arm rests Bed/Chair Transfer: 1: Two helpers, 2: Chair or W/C > Bed: Max A (lift and lower assist), 5: Sit > Supine: Supervision (verbal cues/safety issues)  FIM - Locomotion: Wheelchair Distance: 40' Locomotion: Wheelchair: 0: Activity did not occur FIM - Locomotion: Ambulation Locomotion: Ambulation: 0: Activity did not occur  Comprehension Comprehension Mode: Auditory Comprehension: 5-Understands basic 90% of the time/requires cueing < 10% of the time  Expression Expression Mode: Verbal Expression: 5-Expresses basic 90% of the  time/requires cueing < 10% of the time.  Social Interaction Social Interaction: 4-Interacts appropriately 75 - 89% of the time - Needs redirection for appropriate language or to initiate interaction.  Problem Solving Problem Solving: 4-Solves basic 75 - 89% of the time/requires cueing 10 - 24% of the time  Memory Memory: 4-Recognizes or recalls 75 - 89% of the time/requires cueing 10 - 24% of the time  Medical Problem List and Plan: 1. Functional deficits secondary to Critical illness myopathy.   2.  DVT Prophylaxis/Anticoagulation: Pharmaceutical: Lovenox, pt refusing drop in Hgb will check stool guaic and hold, use SCDs 3. Pain Management: Prn oxycodone.   4. Mood: LCSW to follow for evaluation and support. Team to provide egosupport as well.   5. Neuropsych: This patient is capable of making decisions on her own behalf. 6. Skin/Wound Care: continue foam dressing to sacrum. Order Prevalon boot for right foot as well as elevation when in bed.   7. Fluids/Electrolytes/Nutrition: Strict I/O. 1200 FR due to ESRD. 8. ESRD: HD after therapy day. Nephrology to manage dialysis,               -AVG site tender but functioning, no signs of infection,monitor 9. CAD: metoprolol, ASA, monitor clinically with functional activities 10. A fib: rate controlled at present. Continue metoprolol   12. DM type II: sliding scale insulin. Check cbg's ac and hs             -on amaryl at home, will resume since CBGs above 200 13.  Heme + stool, microscopic, monitor for frank red blood, likely small anal fissure or hemorrhoid plus lovenox, will monitor  LOS (Days) 4 A FACE TO FACE EVALUATION WAS PERFORMED  KIRSTEINS,ANDREW E 08/02/2014, 8:13 AM

## 2014-08-02 NOTE — Progress Notes (Addendum)
Patient had multiple laxatives yesterday to help with constipation. Reports a rough night and feels symptoms may be related to medications. Home medications reviewed. 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 Change Pepcid to Protonix (was taking bid at home) and see if this will help with GI issues.

## 2014-08-02 NOTE — Plan of Care (Signed)
Problem: RH BOWEL ELIMINATION Goal: RH STG MANAGE BOWEL WITH ASSISTANCE STG Manage Bowel with min Assistance.  Outcome: Progressing Goal: RH STG MANAGE BOWEL W/MEDICATION W/ASSISTANCE STG Manage Bowel with Medication with min Assistance.  Outcome: Progressing  Problem: RH SAFETY Goal: RH STG ADHERE TO SAFETY PRECAUTIONS W/ASSISTANCE/DEVICE STG Adhere to Safety Precautions With min Assistance/Device.  Outcome: Progressing Goal: RH STG DECREASED RISK OF FALL WITH ASSISTANCE STG Decreased Risk of Fall With min Assistance.  Outcome: Progressing  Problem: RH COGNITION-NURSING Goal: RH STG USES MEMORY AIDS/STRATEGIES W/ASSIST TO PROBLEM SOLVE STG Uses Memory Aids/Strategies With Assistance to Problem Solve.  Outcome: Progressing Goal: RH STG ANTICIPATES NEEDS/CALLS FOR ASSIST W/ASSIST/CUES STG Anticipates Needs/Calls for Assist With Assistance/Cues.  Outcome: Progressing  Problem: RH PAIN MANAGEMENT Goal: RH STG PAIN MANAGED AT OR BELOW PT'S PAIN GOAL Less than 3 out of 10  Outcome: Progressing     

## 2014-08-02 NOTE — Progress Notes (Addendum)
Patient in bed resting. HOB at 60 degree. Stated she feels better. Denied SOB. Report given to AM nurse. Anglea Gordner A. Erlean Mealor, RN

## 2014-08-02 NOTE — Procedures (Signed)
I have personally attended this patient's dialysis session.   BP in the 90's despite midodrine and is limiting UF but vol up significantly Will try albumin 25 to max of 75 Hb is 8.3. Given her significant CAD and prior arrests, low threshold for transfusion - will give 1 unit PRBC's. Recheck CBC in the AM  Camille Balynthia Tabita Corbo, MD [facility] [phone] Pager 08/02/2014, 3:59 PM

## 2014-08-02 NOTE — Progress Notes (Signed)
Hemodialysis- System clotted with 32 minutes remaining. Pt given 50g albumin, 1 unit of prbcs, iron and frequent flushes to keep system patent. Unable to UF d't hypotension at start of treatment and 1 episode of cramping during last hour. Total UF 1.9L. OK to stop tx per Dr. Marisue HumbleSanford. Pt has no further complaints, no more cramping noted. Unable to rinse back all of venous line, approx 100cc blood loss. Return to room in stable condition

## 2014-08-02 NOTE — Progress Notes (Signed)
Patient, at 0230, c/o SOB after ingestion of Trazodone 25 mg at 0149. O2 sat: 91%, lungs clear to auscultation, in no respiratory distress; patient sitting upright in wheelchair. [facility] 0325 [facility], daughter at bedside. Vitals: 126/73, 99.8, 91, 22, 96% RA. Lungs remain clear. Nurse called in room at 0350. Patient noted sitting at edge of bed. She is nauseated, vomited undigested meal. Continues to c/o SOB. Vitals: 109/61, 98.9, 90, 20, 98% RA. Refused Zofran IM. "I feel sick since I took that pill." Daughter, at this time, concerned about the drop in blood pressure and requested Midodrine. Deatra Inaan Angiulli, PA made aware. No new order at this time. "I will evaluate her when I come in." Patient assessed by RT and Rapid Response Nurse. O2 Sat at 0351: 100% RA. Lungs clear to auscultation. CBG: 255. SOB is relief by 2L [place]. Vitals: 112/62, 98.7, 74, 20, 100% 2L [place]. Currently in bed sleeping. Side rails up x3, call bell and personal items within reach. Will continue to monitor. Bronc Brosseau A. Kenlie Seki, RN.

## 2014-08-02 NOTE — Progress Notes (Signed)
Speech Language Pathology Daily Session Note  Patient Details  Name: Briana Gould MRN: [id number] Date of Birth: 07-07-1946  Today's Date: 08/02/2014 SLP Individual Time: [id number] SLP Individual Time Calculation (min): 60 min  Short Term Goals: Week 1: SLP Short Term Goal 1 (Week 1): Pt will tolerate her currently prescribed diet with no overt s/s of aspiration and supervision cues for use of swallowing precautions.  SLP Short Term Goal 2 (Week 1): Patient will utilize external memory aids to recall new, daily information with supervision multimodal cues.  SLP Short Term Goal 3 (Week 1): Patient will demonstrate functional problem solving for mildly complex tasks with supervision multimodal cues.  SLP Short Term Goal 4 (Week 1): Patient will self-monitor and correct errors during functional tasks with supervision question cues.   Skilled Therapeutic Interventions:  Pt was seen for skilled ST targeting cognitive goals.  Upon arrival, pt was seated upright in wheelchair, awake, alert, and agreeable to participate in ST.  SLP facilitated the session with a medication management task targeting use of compensatory aids for recall of new/daily information.  Pt recalled function of medication when named for ~75% accuracy (previously known medications were easier to recall than newer medications).  SLP provided pt with written hand out of all scheduled medications to facilitate working memory when loading a pill box.  Pt loaded a pill box with medications of varying dosages and frequencies with overall min-mod assist for planning, organization, error awareness, and mental flexibility for ~80% accuracy.  SLP hid medication list in a pt selected location in pt's room to facilitate delayed recall.  Recommend continued practice with medication management at next available appointment.  Pt is making good progress towards meeting goals. Continue per current plan of care.    FIM:  Comprehension Comprehension  Mode: Auditory Comprehension: 5-Follows basic conversation/direction: With extra time/assistive device Expression Expression Mode: Verbal Expression: 5-Expresses basic needs/ideas: With extra time/assistive device Social Interaction Social Interaction: 4-Interacts appropriately 75 - 89% of the time - Needs redirection for appropriate language or to initiate interaction. Problem Solving Problem Solving: 4-Solves basic 75 - 89% of the time/requires cueing 10 - 24% of the time Memory Memory: 4-Recognizes or recalls 75 - 89% of the time/requires cueing 10 - 24% of the time  Pain Pain Assessment Pain Assessment: No/denies pain  Therapy/Group: Individual Therapy   Jackalyn LombardNicole [name], M.A. CCC-SLP  Lynze Reddy, Joni ReiningNicole L 08/02/2014, 11:15 AM

## 2014-08-02 NOTE — Progress Notes (Signed)
Occupational Therapy Session Note  Patient Details  Name: Briana Gould MRN: [id number] Date of Birth: Nov 10, 1945  Today's Date: 08/02/2014 OT Individual Time: 6720-9470 OT Individual Time Calculation (min): 60 min    Short Term Goals: Week 1:  OT Short Term Goal 1 (Week 1): Pt would be able to transfer to and from BSC/ toliet with mod A  OT Short Term Goal 2 (Week 1): Pt would tolerate prosethesis on during transfer training OT Short Term Goal 3 (Week 1): Pt would be able to perform toielting after BM with mod A  OT Short Term Goal 4 (Week 1): Pt will perform UB dressing with setup   Skilled Therapeutic Interventions/Progress Updates:    Pt seen for BADL retraining with a focus on activity tolerance and functional mobility.  Pt in w/c at start of session. Pt set up at sink to complete grooming and bathing.  Pt took quite a while to complete basic grooming and UB bathing as she tends to move very slowly.  Pt had +2 A to assist her with coming into a stand using the sink for support, but pt unable to shift her weight forward or elevate her hips at all after 4 attempts with max A x2. Pt then had to lean back in w/c to reach front perineal area.  Unable to access her buttocks to wash. Will need to complete self care from bed level to allow pt to complete lateral leans.   Pt donned underwear and skirt with total A x2 to assist her with lateral leans in the w/c and to pull pants up. W/c positioned next to bed so pt could lean one side on bed and other side on firm chair. Pt has very weak UE and low activity tolerance. Ted hose applied and pt resting in w/c with call light and all needs met.  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

## 2014-08-02 NOTE — Progress Notes (Signed)
Hemodialysis- BP low at start of HD. Dr. Eliott Nineunham notified. Order to give albumin 25g up to max of 75g as well as one unit of PRBCs. Type and Screen sent to lab, pt signed blood consent. Continue to monitor patient.

## 2014-08-02 NOTE — Progress Notes (Signed)
Briana Gould Rounding Note Subjective:  Finally had good BM yesterday after multiple laxatives Issues with SOB requiring O2 during the night with drop in sats She is quite a bit above EDW and we are limited to UF of 3 liters with HD due to her cardiac issues (she came out of [facility] 10 kg above her EDW...) Up dressing and bathing with OT this AM and not SOB right now - just says "I need a little fluid off" In good spirits  Objective Vital signs in last 24 hours: Filed Vitals:   08/02/14 0350 08/02/14 0420 08/02/14 0557 08/02/14 0657  BP: 109/61 112/62  118/60  Pulse: 90 74  71  Temp: 98.9 F (37.2 C) 98.7 F (37.1 C)  98.3 F (36.8 C)  TempSrc: Oral Oral  Oral  Resp: 20 20  20   Weight:   83.9 kg (184 lb 15.5 oz)   SpO2:  100% 91% 100%   Weight change: -1.3 kg (-2 lb 13.9 oz)  Intake/Output Summary (Last 24 hours) at 08/02/14 0958 Last data filed at 08/02/14 0900  Gross per 24 hour  Intake    360 ml  Output      0 ml  Net    360 ml   Physical Exam:  BP 118/60 mmHg  Pulse 71  Temp(Src) 98.3 F (36.8 C) (Oral)  Resp 20  Wt 83.9 kg (184 lb 15.5 oz)  SpO2 100%  General: Well developed, well nourished, sitting in the wheelchair working on bathing and dressing Head: Normocephalic, atraumatic  Neck: Supple. JVD not elevated in upright popsition.   Lungs: Clear bilaterally to auscultation without wheezes, rales, or rhonchi though diminished slightly at bases posteriorly Heart: Regular with S1 S2. No murmurs, rubs, or gallops appreciated. Abdomen: Large pannus. No abd tenderness Lower extremities: RLE +2 pitting edema. L BKA Neuro: Alert and oriented X 3.  Psych: Responds to questions appropriately with a normal affect Dialysis Access: L AVF with + bruit but still with hematomas - hard - would not be able to cannulate around.  Left  IJ (exchanged 11/9) TDC with dressing in place  Labs: No new labs today (will have done with HD)   Recent Labs Lab  07/27/14 0700 07/28/14 0640 07/29/14 0719 07/31/14 1609  NA 134* 138 133* 133*  K 5.0 4.9 5.1 4.6  CL 93* 98 94* 91*  CO2 24 23 22 24   GLUCOSE 139* 129* 176* 226*  BUN 55* 39* 56* 58*  CREATININE 5.49* 4.31* 5.45* 5.44*  CALCIUM 9.5 9.1 9.5 9.2  PHOS 3.0 3.0 3.0 2.9     Recent Labs Lab 07/28/14 0640 07/29/14 0719 07/31/14 1609  ALBUMIN 2.9* 3.2* 3.1*    Recent Labs Lab 07/27/14 0628 07/31/14 1609  WBC  --  9.9  HGB 9.0* 8.1*  HCT 28.7* 25.7*  MCV  --  98.1  PLT  --  208    Recent Labs Lab 08/01/14 1152 08/01/14 1652 08/01/14 2149 08/02/14 0351 08/02/14 0700  GLUCAP 261* 227* 178* 255* 209*   Medications:   . aspirin EC  81 mg Oral Daily  . [START ON 08/03/2014] darbepoetin (ARANESP) injection - DIALYSIS  150 mcg Intravenous Q Wed-HD  . docusate sodium  100 mg Oral BID  . doxercalciferol  5 mcg Intravenous Q M,W,F-HD  . feeding supplement (NEPRO CARB STEADY)  237 mL Oral TID WC  . [START ON 08/03/2014] ferric gluconate (FERRLECIT/NULECIT) IV  62.5 mg Intravenous Weekly  . fluticasone  2 puff  Inhalation BID  . glimepiride  2 mg Oral Q breakfast  . guaiFENesin  600 mg Oral BID  . hydrocerin   Topical BID  . insulin aspart  0-15 Units Subcutaneous TID WC  . metoCLOPramide  10 mg Oral TID AC & HS  . metoprolol tartrate  12.5 mg Oral 4 times per day on Sun Tue Thu Sat  . midodrine  5 mg Oral Q M,W,F-HD  . multivitamin  1 tablet Oral QHS  . pantoprazole  40 mg Oral Daily  . polyethylene glycol  17 g Oral Daily  . prednisoLONE acetate  1 drop Both Eyes TID AC  . sevelamer carbonate  1,600 mg Oral TID WC     Dialysis Orders: MWF [place] (from prior to 06/27/14 admission post cardiac arrest)  75kgs 2K/2Ca 4hours  5000 Heparin R IJ L AVF 350/1.5 Limit UF to 3 liters/treatment hectorol 2 Aranesp 25this has been increased to 150 with adm back to rehab unit Venofer 50 q week Profile 4   BACKGROUND 68 y.o. AA female with DM, ESRD  on HD, PAD, left BKA, severe know CAD (not surgical candidate), h/o CVA originally admitted 10/19 after cardiac arrest while getting HD cath placed (due to hematoma AVF), when she became pulseless and received 2 mins CPR, this was her 3rd cardiac arrest. She was unresponsive upon admission,required intubation. She has followed with cardiology is not a candidate for PCI, remains on medical therapy. She was treated for HCAP that admission with vanc and ceftaz. She was discharged [facility]/29 until 11/20, she is now in inpatient rehab. Has been tolerating HD well on midodrine, progressing with PT and feeling better.   Assessment/Plan:  1. Critical illness myopathy - rehab 2. Afib- not a candidate for anticoagulation. Metoprolol  3. ESRD - HD MWF. Holiday schedule Sun Tues Fri so HD today Keep BFR 350, 3 liters off max to minimize cardiac stress as much as possible. Midodrine. She is now about 9 kg above her outpt EDW - came out of [facility] very overloaded (10 kg up) 4. BP/volume - BP support with midodrine. 9 kgs over previous outpt edw- sig RLE edema and had SOB last PM. Had outpt order for 3L max UF so will have to slowly get weight back down 5. Anemia - Last hgb 8.1. ? If error as was drop from 9.0 day before. Cont aranesp increased dose to 150 - will get today . CBC with HD today. Low threshold for transfusion since such sig CAD TSat 20% with Fe of 57 on 11/22 (did not see these yesterday) 6. Metabolic bone disease - Ca+9.5 Pth 769 (11/4) cont hectorol. Last phos 2.9 Renvela was to have been held but appears we did not hold it - will see what her phos is today and decide whether to hold vs reduce from that point 7. Nutrition - alb 3.1. Nepro. Renal diet.  8. DM- per primary. a1c 6.5 (11/20) 9. Left AVF hematoma - shuntogram 11/4 patent AV access; 2 hematomas still present on Duplex from 11/14. On exam hematomas are rock hard and would preclude cannulation of the access although bruit  auscultated.  [facility] 10. Constipation - laxatives 11. Cardiac Arrest- 3rd episode 10/19. Medical management for CAD, not a candidate for revascularization.     Briana Balynthia Casy Brunetto, MD [facility] [phone] pager 08/02/2014, 9:58 AM

## 2014-08-02 NOTE — Progress Notes (Signed)
Recreational Therapy Session Note  Patient Details  Name: [name] MRN: [id number] Date of Birth: 26-Feb-1946 Today's Date: 08/02/2014   Order received & chart reviewed  Attempted eval completion, unable to keep pt awake to complete.  Discussion with PT about pt's current functional status & activity tolerance.  Pt placed on HOLD for TR services at this time.  Will continue to monitor through team for future participation. Graysin Luczynski 08/02/2014, 3:34 PM

## 2014-08-03 ENCOUNTER — Inpatient Hospital Stay (HOSPITAL_COMMUNITY): Payer: Medicare Other | Admitting: Occupational Therapy

## 2014-08-03 ENCOUNTER — Inpatient Hospital Stay (HOSPITAL_COMMUNITY): Payer: BC Managed Care – PPO | Admitting: Speech Pathology

## 2014-08-03 ENCOUNTER — Inpatient Hospital Stay (HOSPITAL_COMMUNITY): Payer: Medicare Other

## 2014-08-03 DIAGNOSIS — N185 Chronic kidney disease, stage 5: Secondary | ICD-10-CM

## 2014-08-03 LAB — GLUCOSE, CAPILLARY
GLUCOSE-CAPILLARY: 191 mg/dL — AB (ref 70–99)
Glucose-Capillary: 114 mg/dL — ABNORMAL HIGH (ref 70–99)
Glucose-Capillary: 189 mg/dL — ABNORMAL HIGH (ref 70–99)
Glucose-Capillary: 165 mg/dL — ABNORMAL HIGH (ref 70–99)

## 2014-08-03 LAB — CBC
HCT: 27.5 % — ABNORMAL LOW (ref 36.0–46.0)
Hemoglobin: 8.6 g/dL — ABNORMAL LOW (ref 12.0–15.0)
MCH: 30.5 pg (ref 26.0–34.0)
MCHC: 31.3 g/dL (ref 30.0–36.0)
MCV: 97.5 fL (ref 78.0–100.0)
PLATELETS: 229 10*3/uL (ref 150–400)
RBC: 2.82 MIL/uL — AB (ref 3.87–5.11)
RDW: 20.3 % — ABNORMAL HIGH (ref 11.5–15.5)
WBC: 9.8 10*3/uL (ref 4.0–10.5)

## 2014-08-03 MED ORDER — SEVELAMER CARBONATE 800 MG PO TABS
800.0000 mg | ORAL_TABLET | Freq: Three times a day (TID) | ORAL | Status: DC
  Administered 2014-08-03 – 2014-08-19 (×40): 800 mg via ORAL
  Filled 2014-08-03 (×51): qty 1

## 2014-08-03 NOTE — Progress Notes (Addendum)
Physical Therapy Make Up Session Note  Patient Details  Name: Briana Gould MRN: [id number] Date of Birth: 1946-08-19  Today's Date: 08/03/2014 PT Concurrent Time: 1530-1600 PT Concurrent Time Calculation (min): 30 min   Short Term Goals: Week 1:  PT Short Term Goal 1 (Week 1): Pt will demonstrate supine to sit transfer with SBA PT Short Term Goal 2 (Week 1): Pt will scoot transfer with SB req mod A.  PT Short Term Goal 3 (Week 1): Pt will self propel manual w/c x 100' req SBA.  PT Short Term Goal 4 (Week 1): Pt will initiate ambulate with PT during session.  PT Short Term Goal 5 (Week 1): Pt will demonstrate sit to supine transfer with SBA.   Skilled Therapeutic Interventions/Progress Updates:    Patient received sitting in wheelchair, from OT session. Patient with c/o significant fatigue (this session third therapy session in a row) and therefore emphasis of session on LE therex seated in wheelchair, wheelchair parts management, and energy conservation education. Seated LAQ, knee flexion, hip add with ball squeeze, R ankle pumps, x20 each. Wheelchair parts management with focus on donning/doffing leg rests. Additional practice with properly locking leg rests in place as patient's personal wheelchair leg rests do not click into place without additional securing. Wheelchair mobility x25' with B UEs and supervision with increased time. Patient left sitting in wheelchair with all needs within reach.  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 Kathelene Rumberger, PT, DPT 08/03/2014, 4:40 PM

## 2014-08-03 NOTE — Progress Notes (Signed)
Social Work Lucy Chrisebecca G Sheray Grist, LCSW Social Worker Signed  Patient Care Conference 08/03/2014  1:36 PM    Expand All Collapse All   Inpatient RehabilitationTeam Conference and Plan of Care Update Date: 08/03/2014   Time: 10;45 AM     Patient Name: Briana Gould       Medical Record Number: [id number]  Date of Birth: April 28, 1946 Sex: Female         Room/Bed: 4O96E/9B28U-134W04C/4W04C-01 Payor Info: Payor: MEDICARE / Plan: MEDICARE PART A AND B / Product Type: *No Product type* /    Admitting Diagnosis: Critical illness Myopathy  deconditioning   Admit Date/Time:  07/29/2014  4:46 PM Admission Comments: No comment available   Primary Diagnosis:  Critical illness myopathy Principal Problem: Critical illness myopathy    Patient Active Problem List     Diagnosis  Date Noted   .  Critical illness myopathy  07/29/2014   .  HCAP (healthcare-associated pneumonia)  12/12/2013   .  Physical deconditioning  10/15/2013   .  CVA (cerebral infarction)  10/01/2013   .  Paroxysmal a-fib  10/01/2013   .  Cardiac arrest  09/20/2013   .  Acute respiratory failure with hypoxia  09/20/2013   .  Hypertension  09/20/2013   .  Encephalopathy acute  09/20/2013   .  Bradycardia- intol to beta blocker  09/03/2013   .  Anemia of chronic renal failure  08/30/2013   .  Secondary hyperparathyroidism  08/30/2013   .  GIB (gastrointestinal bleeding)--recurrent- not a candidate for Plavix  08/29/2013   .  CAD- severe 3V CAD at cath March 2015- medical Rx  08/16/2013   .  History of syncope  08/16/2013   .  Aortic stenosis  08/16/2013   .  R Jugular vein thrombosis  08/15/2013   .  Cerebrovascular disease  08/15/2013   .  Thrombocytopenia  08/14/2013   .  Seizure  08/13/2013   .  PAD (peripheral artery disease)  08/13/2013   .  Weakness generalized  11/08/2011   .  ESRD (end stage renal disease) on dialysis  11/04/2011   .  MITRAL REGURGITATION  10/04/2010   .  AORTIC STENOSIS  10/04/2010   .  Diabetes mellitus with end  stage renal disease  09/13/2010   .  HYPERLIPIDEMIA  09/13/2010   .  OBESITY  09/13/2010   .  Essential hypertension  09/13/2010   .  PULMONARY HYPERTENSION  09/13/2010   .  Status post below knee amputation  09/13/2010     Expected Discharge Date: Expected Discharge Date: 08/16/14  Team Members Present: Physician leading conference: Dr. Claudette LawsAndrew Kirsteins Social Worker Present: Dossie DerBecky Hicks Feick, LCSW Nurse Present: Carmie EndAngie Joyce, RN PT Present: Edman CircleAudra Hall, PT;Caroline Adriana [place]ook, PT;Blair Hobble, PT OT Present: Pleas Patriciahristina Shaffer, OT SLP Present: Jackalyn LombardNicole Page, SLP PPS Coordinator present : Tora DuckMarie Noel, RN, CRRN        Current Status/Progress  Goal  Weekly Team Focus   Medical     Poor therapy tolerance,  anemia, end-stage renal on dialysis  Improve endurance  Decreased therapy to 15/7   Bowel/Bladder     Continent of bowel, anuric  Continent of bowel with min assist   assesss for s/s of constipation q shift    Swallow/Nutrition/ Hydration     Regular solids, thin liquids; upgraded to intermittent supervision   Mod I  continued diet tolerance    ADL's     Pt unable to come into a stand  even with total A x 2, total A toileting, max A to BSC with Slide board,  total A LB dressing, mod A bathing; s/u for UB self care  min A overall  ADL retraining, functional mobility, transfers, UE strengthening, pt/family education, cognitive remediation   Mobility     S bed mobility, Max A sliding board transfers, +2 sit<>stand, Min A short dist WC, no gait/stairs  Mod I bed mobility,  transfers, short distance gait, and WC prop x150'  strengthening, activity tolerance, transfer training, pre-gait, WC prop   Communication               Safety/Cognition/ Behavioral Observations    min assist   supervision   awareness, functional problem solving, memory    Pain     Patient reports occcasional headache relieved with tylenol   Pain less than or equal to 3 on a scale of 0-10  Assess for pain q4 hours and prn    Skin     Stage II to sacrum (healing) DTI to R heel (Prevalon boot in bed)  No new skin breakdown/injury, continued healing of current skin issues  Assess skin q shift and prn      *See Care Plan and progress notes for long and short-term goals.    Barriers to Discharge:  The above     Possible Resolutions to Barriers:   See above     Discharge Planning/Teaching Needs:   Home with daughter who is there in the evenings, pt will need to be mod/i level to return home.        Team Discussion:    Pt had blood transfusion yesterday and is not sleeping well.  15/7 poor endurance-have neuro-psych see. Need to confirm caregiver for home-if goals not upgraded.   Revisions to Treatment Plan:    Goals downgraded to min assist level    Continued Need for Acute Rehabilitation Level of Care: The patient requires daily medical management by a physician with specialized training in physical medicine and rehabilitation for the following conditions: Daily direction of a multidisciplinary physical rehabilitation program to ensure safe treatment while eliciting the highest outcome that is of practical value to the patient.: Yes Daily medical management of patient stability for increased activity during participation in an intensive rehabilitation regime.: Yes Daily analysis of laboratory values and/or radiology reports with any subsequent need for medication adjustment of medical intervention for : Other  Lucy ChrisDupree, Eduar Kumpf G 08/03/2014, 2:34 PM                  [name], female   DOB: November 30, 1945, 68 y.o.   MRN: [id number]

## 2014-08-03 NOTE — Patient Care Conference (Signed)
Inpatient RehabilitationTeam Conference and Plan of Care Update Date: 08/03/2014   Time: 10;45 AM    Patient Name: Briana Gould      Medical Record Number: [id number]  Date of Birth: 1946/01/07 Sex: Female         Room/Bed: 4W04C/4W04C-01 Payor Info: Payor: MEDICARE / Plan: MEDICARE PART A AND B / Product Type: *No Product type* /    Admitting Diagnosis: Critical illness Myopathy  deconditioning   Admit Date/Time:  07/29/2014  4:46 PM Admission Comments: No comment available   Primary Diagnosis:  Critical illness myopathy Principal Problem: Critical illness myopathy  Patient Active Problem List   Diagnosis Date Noted  . Critical illness myopathy 07/29/2014  . HCAP (healthcare-associated pneumonia) 12/12/2013  . Physical deconditioning 10/15/2013  . CVA (cerebral infarction) 10/01/2013  . Paroxysmal a-fib 10/01/2013  . Cardiac arrest 09/20/2013  . Acute respiratory failure with hypoxia 09/20/2013  . Hypertension 09/20/2013  . Encephalopathy acute 09/20/2013  . Bradycardia- intol to beta blocker 09/03/2013  . Anemia of chronic renal failure 08/30/2013  . Secondary hyperparathyroidism 08/30/2013  . GIB (gastrointestinal bleeding)--recurrent- not a candidate for Plavix 08/29/2013  . CAD- severe 3V CAD at cath March 2015- medical Rx 08/16/2013  . History of syncope 08/16/2013  . Aortic stenosis 08/16/2013  . 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 Cerebrovascular disease 08/15/2013  . Thrombocytopenia 08/14/2013  . Seizure 08/13/2013  . PAD (peripheral artery disease) 08/13/2013  . Weakness generalized 11/08/2011  . ESRD (end stage renal disease) on dialysis 11/04/2011  . MITRAL REGURGITATION 10/04/2010  . AORTIC STENOSIS 10/04/2010  . 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 HYPERLIPIDEMIA 09/13/2010  . OBESITY 09/13/2010  . Essential hypertension 09/13/2010  . PULMONARY HYPERTENSION 09/13/2010  . Status post below knee amputation 09/13/2010     Expected Discharge Date: Expected Discharge Date: 08/16/14  Team Members Present: Physician leading conference: Dr. Claudette LawsAndrew Kirsteins Social Worker Present: Dossie DerBecky Quandre Polinski, LCSW Nurse Present: Carmie EndAngie Joyce, RN PT Present: Edman CircleAudra Hall, PT;Caroline Adriana [place], PT;Blair Hobble, PT OT Present: Pleas Patriciahristina Shaffer, OT SLP Present: Jackalyn LombardNicole Page, SLP PPS Coordinator present : Tora DuckMarie Noel, RN, CRRN     Current Status/Progress Goal Weekly Team Focus  Medical   Poor therapy tolerance,  anemia, end-stage renal on dialysis  Improve endurance  Decreased therapy to 15/7   Bowel/Bladder   Continent of bowel, anuric  Continent of bowel with min assist  assesss for s/s of constipation q shift   Swallow/Nutrition/ Hydration   Regular solids, thin liquids; upgraded to intermittent supervision   Mod I  continued diet tolerance    ADL's   Pt unable to come into a stand even with total A x 2, total A toileting, max A to BSC with Slide board,  total A LB dressing, mod A bathing; s/u for UB self care  min A overall  ADL retraining, functional mobility, transfers, UE strengthening, pt/family education, cognitive remediation   Mobility   S bed mobility, Max A sliding board transfers, +2 sit<>stand, Min A short dist WC, no gait/stairs  Mod I bed mobility,  transfers, short distance gait, and WC prop x150'  strengthening, activity tolerance, transfer training, pre-gait, WC prop   Communication             Safety/Cognition/ Behavioral Observations  min assist   supervision   awareness, functional problem solving, memory   Pain   Patient reports occcasional headache relieved with tylenol  Pain less than or equal to 3 on a  scale of 0-10  Assess for pain q4 hours and prn   Skin   Stage II to sacrum (healing) DTI to R heel (Prevalon boot in bed)  No new skin breakdown/injury, continued healing of current skin issues  Assess skin q shift and prn      *See Care Plan and progress notes for long and short-term  goals.  Barriers to Discharge: The above    Possible Resolutions to Barriers:  See above    Discharge Planning/Teaching Needs:  Home with daughter who is there in the evenings, pt will need to be mod/i level to return home.       Team Discussion:  Pt had blood transfusion yesterday and is not sleeping well.  15/7 poor endurance-have neuro-psych see. Need to confirm caregiver for home-if goals not upgraded.  Revisions to Treatment Plan:  Goals downgraded to min assist level   Continued Need for Acute Rehabilitation Level of Care: The patient requires daily medical management by a physician with specialized training in physical medicine and rehabilitation for the following conditions: Daily direction of a multidisciplinary physical rehabilitation program to ensure safe treatment while eliciting the highest outcome that is of practical value to the patient.: Yes Daily medical management of patient stability for increased activity during participation in an intensive rehabilitation regime.: Yes Daily analysis of laboratory values and/or radiology reports with any subsequent need for medication adjustment of medical intervention for : Other  Lucy ChrisDupree, Alann Avey G 08/03/2014, 2:34 PM

## 2014-08-03 NOTE — Plan of Care (Signed)
Problem: RH BOWEL ELIMINATION Goal: RH STG MANAGE BOWEL WITH ASSISTANCE STG Manage Bowel with min Assistance.  Outcome: Progressing     

## 2014-08-03 NOTE — Plan of Care (Signed)
Problem: RH BOWEL ELIMINATION Goal: RH STG MANAGE BOWEL W/MEDICATION W/ASSISTANCE STG Manage Bowel with Medication with min Assistance.  Outcome: Progressing     

## 2014-08-03 NOTE — Progress Notes (Signed)
[facility] Rounding Note Subjective:  Sitting in chair, no complaints, some cramping at end of dialysis yesterday, anticipates discharge today or tomorrow???No mention of this in rehab note that I can see Had dialysis yesterday and we transfused 1 unit of PRBC's. Required this + albumin to maintain BP and net UF could only get off 1819 ml  Objective: Vital signs in last 24 hours: Temp:  [97.5 F (36.4 C)-99.1 F (37.3 C)] 99.1 F (37.3 C) (11/25 0521) Pulse Rate:  [58-76] 75 (11/25 0521) Resp:  [15-20] 18 (11/25 0521) BP: (91-135)/(37-82) 110/54 mmHg (11/25 0521) SpO2:  [96 %-100 %] 100 % (11/25 0521) Weight:  [81.7 kg (180 lb 1.9 oz)-83.5 kg (184 lb 1.4 oz)] 83 kg (182 lb 15.7 oz) (11/25 0521) Weight change: -0.4 kg (-14.1 oz)  Intake/Output from previous day: 11/24 0701 - 11/25 0700 In: 405 [P.O.:120; Blood:285] Out: 1816  Intake/Output this shift: Total I/O In: 120 [P.O.:120] Out: -    EXAM: General appearance:  Alert, in no apparent distress Resp:  CTA without rales, rhonchi, or wheezes Cardio:  RRR without murmur or rub GI: + BS, soft and nontender Extremities:  1+ edema on RLE w/ support stocking, L BKA Access:  L IJ catheter, AVF @ LUA with + bruit, but firm hematomas preventing use  Lab Results:  Recent Labs  08/02/14 1518 08/03/14 0608  WBC 9.6 9.8  HGB 8.3* 8.6*  HCT 26.0* 27.5*  PLT 235 229   BMET:  Recent Labs  07/31/14 1609 08/02/14 1518  NA 133* 137  K 4.6 4.4  CL 91* 94*  CO2 24 25  GLUCOSE 226* 81  BUN 58* 52*  CREATININE 5.44* 5.09*  CALCIUM 9.2 9.6  ALBUMIN 3.1* 3.2*   Iron Studies:  Recent Labs  07/31/14 2240  IRON 57  TIBC 200*   Scheduled Medications . sodium chloride   Intravenous Once  . aspirin EC  81 mg Oral Daily  . darbepoetin (ARANESP) injection - DIALYSIS  150 mcg Intravenous Q Wed-HD  . docusate sodium  100 mg Oral BID  . doxercalciferol  5 mcg Intravenous Q M,W,F-HD  . feeding supplement (NEPRO  CARB STEADY)  237 mL Oral TID WC  . [START ON 08/10/2014] ferric gluconate (FERRLECIT/NULECIT) IV  62.5 mg Intravenous Q Wed-HD  . fluticasone  2 puff Inhalation BID  . glimepiride  2 mg Oral Q breakfast  . guaiFENesin  600 mg Oral BID  . hydrocerin   Topical BID  . insulin aspart  0-15 Units Subcutaneous TID WC  . metoCLOPramide  10 mg Oral TID AC & HS  . metoprolol tartrate  12.5 mg Oral 4 times per day on Sun Tue Thu Sat  . midodrine  5 mg Oral Q M,W,F-HD  . multivitamin  1 tablet Oral QHS  . pantoprazole  40 mg Oral Daily  . polyethylene glycol  17 g Oral Daily  . prednisoLONE acetate  1 drop Both Eyes TID AC  . sevelamer carbonate  800 mg Oral TID WC    Dialysis Orders: MWF [place] (from prior to 06/27/14 admission post cardiac arrest)  75kgs 2K/2Ca 4hours 5000 Heparin Left IJ L AVF 350/1.5 Limit UF to 3 liters/treatment hectorol 2 Aranesp 25this has been increased to 150 with adm back to rehab unit Venofer 50 q week Profile 4   BACKGROUND 68 y.o. AA female with DM, ESRD on HD, PAD, left BKA, severe know CAD (not surgical candidate), h/o CVA originally admitted 10/19 after cardiac  arrest while getting HD cath placed (due to hematoma AVF), when she became pulseless and received 2 mins CPR, this was her 3rd cardiac arrest. She was unresponsive upon admission,required intubation. She has followed with cardiology is not a candidate for PCI, remains on medical therapy. She was treated for HCAP that admission with vanc and ceftaz. She was discharged to [facility] 10/29 until 11/20, she is now in inpatient rehab. Has been tolerating HD well on midodrine, progressing with PT and feeling better.   Assessment/Plan: 1. Critical illness myopathy - rehab since 11/20. 2. A-fib - not a candidate for anticoagulation, HR controlled on Metoprolol  3. ESRD - HD on MWF @ [facility].  Keep BFR 350, 3 liters off max to minimize cardiac stress as much as possible. Midodrine. She is  now about 8 kg above her outpt EDW - came out of [facility] very overloaded (10 kg up). 4. BP/volume - BP support with midodrine; 8 kgs over previous outpt edw- sig RLE edema, but cramping s/p net UF only 1.8 L yesterday. 5. Anemia - Hgb up to 8.6 after a unit of PRBC's with HD yesterday - not as much of a rise as I would anticipate, Aranesp increased to 150 mcg yesterday and received dose in HD.  Low threshold for additional transfusion since such sig CAD, TSat 29% with Fe of 57 on 11/22. 6. Metabolic bone disease - Ca 9.6 (10.4 corrected), P 2.8, PTH 769 (11/4) cont Hectorol, reduce Renvela to 1 with meals. 7. Nutrition - Alb 3.2, Nepro, renal diet.  8. DM - per primary, A1c 6.5 (11/20) 9. Left AVF hematoma - shuntogram 11/4 patent AV access; 2 hematomas still present on Duplex from 11/14. On exam hematomas are rock hard and would preclude cannulation of the access although bruit auscultated. [facility]. 10. Constipation - laxatives 11. Cardiac Arrest- 3rd episode 10/19. Medical management for CAD, not a candidate for revascularization.  12. Heme + stool - 1 of 2 + on 11/23 (when Hb drop to 8.1). No frank bleeding.  Monitor. Consider additional PRBC's if still in the 8's next HD (due to severe CAD) - if less than 9 on Friday will give a unit.   LOS: 5 days   LYLES,CHARLES 08/03/2014,8:26 AM  I have seen and examined this patient and agree with plan and assessment in the above note with highlighted additions. Next HD Friday. Monitor Hb. Considering additional transfusion with next HD d/t severeity of CAD (and 3 prior arrests) Camille Balynthia Sair Faulcon, MD [facility] [phone] Pager 08/03/2014, 10:52 AM

## 2014-08-03 NOTE — Progress Notes (Signed)
Occupational Therapy Session Note  Patient Details  Name: Briana Gould MRN: [id number] Date of Birth: 06/03/1946  Today's Date: 08/03/2014 OT Individual Time: 0920-1005 OT Individual Time Calculation (min): 45 min    Short Term Goals: Week 1:  OT Short Term Goal 1 (Week 1): Pt would be able to transfer to and from BSC/ toliet with mod A  OT Short Term Goal 2 (Week 1): Pt would tolerate prosethesis on during transfer training OT Short Term Goal 3 (Week 1): Pt would be able to perform toielting after BM with mod A  OT Short Term Goal 4 (Week 1): Pt will perform UB dressing with setup   Skilled Therapeutic Interventions/Progress Updates:    Pt seen for BADL retraining with a focus on activity tolerance, functional transfers, and UE strength.  Pt in w/c stating she was extremely tired and needed to go to bed. Pt transferred with SB with max A to bed and total A to scoot back in bed. She sat EOB to complete UB self care. Pt was moving very slowly and needed cues to stay on task.  She requested to wear her O2 as she was feeling SOB. Pt needed max A to move into supine to work on LB self care. She cleansed perineal area from supine and needed min A to roll fully onto sides to allow therapist to wash back side and don clothing over her hips. +2 A to scoot pt up in bed.  Pt left in bed with all needs met.  Therapy Documentation Precautions:  Precautions Precautions: Fall Precaution Comments: old left BKA Restrictions Weight Bearing Restrictions: No Other Position/Activity Restrictions: old L BKA    Vital Signs: Oxygen Therapy SpO2: 94 % O2 Device: Not Delivered Pain: Pain Assessment Pain Assessment: No/denies pain Pain Score: 0-No pain ADL: ADL ADL Comments: see FIM   See FIM for current functional status  Therapy/Group: Individual Therapy  Kyndell Zeiser 08/03/2014, 11:54 AM

## 2014-08-03 NOTE — Plan of Care (Signed)
Problem: RH PAIN MANAGEMENT Goal: RH STG PAIN MANAGED AT OR BELOW PT'S PAIN GOAL Less than 3 out of 10  Outcome: Progressing     

## 2014-08-03 NOTE — Progress Notes (Addendum)
Physical Therapy Session Note  Patient Details  Name: Briana Gould Bocek MRN: [id number] Date of Birth: [birth year]  Today's Date: 08/03/2014 PT Individual Time: 1400-1500 PT Individual Time Calculation (min): 60 min   Short Term Goals: Week 1:  PT Short Term Goal 1 (Week 1): Pt will demonstrate supine to sit transfer with SBA PT Short Term Goal 2 (Week 1): Pt will scoot transfer with SB req mod A.  PT Short Term Goal 3 (Week 1): Pt will self propel manual w/c x 100' req SBA.  PT Short Term Goal 4 (Week 1): Pt will initiate ambulate with PT during session.  PT Short Term Goal 5 (Week 1): Pt will demonstrate sit to supine transfer with SBA.   Skilled Therapeutic Interventions/Progress Updates:  Pt sitting EOB holding onto rail, visiting with sister when PT entered room.  Pt motivated to participate, and wanted to try to get L prosthesis on.  ACE wrap removed from residual limb by PT.  She donned sleeve modified independent, and attempted to don prosthesis in sitting, high sitting on raised bed, and in semi-standing pulling up on secure furniture in front of her.  Residual limb is still a little bit too swollen to fit in; pt agreed to wear sleeve to provide compression for now.   Bed> w/c SBT to L, initially max assist of 1 person, but required 2nd person to steady chair as she moved into chair. PT educated pt about head/hips relationship as she tends to lean head first toward surface she is transferring to; cues to retain this idea during transfer.   W/c propulsion using bil UEs and RLE wearing clog x 75', limited by UE fatigue.  Using EmelleStedy and +3 assist, pt stood up x 3, attempting to don prosthesis when in standing, unsuccessfully.  No c/o pain.  After 3rd brief stand, pt stated she "was done".  See vitals below; discussed with RN, next therapist.   Pt handed off to next therapist.    Therapy Documentation Precautions:  Precautions Precautions: Fall Precaution Comments: old left  BKA Restrictions Weight Bearing Restrictions: No Other Position/Activity Restrictions: old L BKA   Vital Signs: Therapy Vitals Pulse Rate: (!) 36 Patient Position (if appropriate): Sitting Oxygen Therapy SpO2: 100 % O2 Device: Not Delivered Pain: Pain Assessment Pain Assessment: No/denies pain       See FIM for current functional status  Therapy/Group: Individual Therapy  Belynda Pagaduan 08/03/2014, 3:22 PM

## 2014-08-03 NOTE — Progress Notes (Signed)
Social Work [name], female   DOB: [birth year], 68 y.o.   MRN: [id number] Mer with pt and tow sisters were here visitig pt.  All informed of team conferencgoals-min level and discharge 12/8.  Made aware she would require 24 hr care at home. Pt is much brighter and laughing today, says her sister's are a trip.  Sister's bickering back and forth.  Discued having them come in and going through therapies with pt  Prior to discharge. Pt feels she will be doing better now.  Will continue to work on discharge needs.  Will also contact daughter to inform.

## 2014-08-03 NOTE — Progress Notes (Signed)
Subjective/Complaints: 68 year old female with h/o severe multivessel CAD (not candidate for percutaneous intervention), ESRD on HD, and PAD s/p R- BKA, h/o prior cardiac arrest X 2; who was admitted to on 06/27/2014 due to cardiopulmonary arrest past having HD cath placed at outpatient center. She received CPR for 2 minutes with return of spontaneous circulation and was unresponsive w/ ineffective ventilation and posturing on arrival to ED. She was placed under the hypothermia protocol. 2D echo with EF 45-50% with mild LVF and elevated pulmonary pressures. On 06/29/2014 she was rewarmed and extubated but remained lethargic and nonverbal with concerns of anoxic encephalopathy. CT head negative. EEG done showing diffuse cerebral disturbance--non-specific. CXR on 10/22 with LLL HCAP and patient has completed her antibiotic course. Cardiology consulted for input and recommended medical treatment as EKG and troponin negative. PCCM felt that arrest most likely due to baseline hypotension and pulmonary hypertension. A fib treated with IV Cardizem with recommendations to transition to oral BB with close monitoring as due to patient's h/o hypotension past HD. Swallow evaluation with evidence on dysphagia and patient placed on D3  Nectar liquids   No issues overnite  Review of Systems - Negative except fatigue Objective: Vital Signs: Blood pressure 110/54, pulse 75, temperature 99.1 F (37.3 C), temperature source Oral, resp. rate 18, weight 83 kg (182 lb 15.7 oz), SpO2 100 %. No results found. Results for orders placed or performed during the hospital encounter of 07/29/14 (from the past 72 hour(s))  Glucose, capillary     Status: Abnormal   Collection Time: 07/31/14 12:03 PM  Result Value Ref Range   Glucose-Capillary 208 (H) 70 - 99 mg/dL  CBC     Status: Abnormal   Collection Time: 07/31/14  4:09 PM  Result Value Ref Range   WBC 9.9 4.0 - 10.5 K/uL   RBC 2.62 (L) 3.87 - 5.11 MIL/uL   Hemoglobin  8.1 (L) 12.0 - 15.0 g/dL   HCT 25.7 (L) 36.0 - 46.0 %   MCV 98.1 78.0 - 100.0 fL   MCH 30.9 26.0 - 34.0 pg   MCHC 31.5 30.0 - 36.0 g/dL   RDW 19.7 (H) 11.5 - 15.5 %   Platelets 208 150 - 400 K/uL  Renal function panel     Status: Abnormal   Collection Time: 07/31/14  4:09 PM  Result Value Ref Range   Sodium 133 (L) 137 - 147 mEq/L   Potassium 4.6 3.7 - 5.3 mEq/L   Chloride 91 (L) 96 - 112 mEq/L   CO2 24 19 - 32 mEq/L   Glucose, Bld 226 (H) 70 - 99 mg/dL   BUN 58 (H) 6 - 23 mg/dL   Creatinine, Ser 5.44 (H) 0.50 - 1.10 mg/dL   Calcium 9.2 8.4 - 10.5 mg/dL   Phosphorus 2.9 2.3 - 4.6 mg/dL   Albumin 3.1 (L) 3.5 - 5.2 g/dL   GFR calc non Af Amer 7 (L) >90 mL/min   GFR calc Af Amer 8 (L) >90 mL/min    Comment: (NOTE) The eGFR has been calculated using the CKD EPI equation. This calculation has not been validated in all clinical situations. eGFR's persistently <90 mL/min signify possible Chronic Kidney Disease.    Anion gap 18 (H) 5 - 15  Glucose, capillary     Status: Abnormal   Collection Time: 07/31/14  7:58 PM  Result Value Ref Range   Glucose-Capillary 104 (H) 70 - 99 mg/dL  Iron and TIBC     Status: Abnormal  Collection Time: 07/31/14 10:40 PM  Result Value Ref Range   Iron 57 42 - 135 ug/dL   TIBC 200 (L) 250 - 470 ug/dL   Saturation Ratios 29 20 - 55 %   UIBC 143 125 - 400 ug/dL    Comment: Performed at [facility]  Glucose, capillary     Status: Abnormal   Collection Time: 08/01/14  6:36 AM  Result Value Ref Range   Glucose-Capillary 157 (H) 70 - 99 mg/dL  Occult blood, poc device     Status: Abnormal   Collection Time: 08/01/14 11:38 AM  Result Value Ref Range   Fecal Occult Bld POSITIVE (A) NEGATIVE  Glucose, capillary     Status: Abnormal   Collection Time: 08/01/14 11:52 AM  Result Value Ref Range   Glucose-Capillary 261 (H) 70 - 99 mg/dL  Occult blood card to lab, stool RN will collect     Status: None   Collection Time: 08/01/14  1:42 PM   Result Value Ref Range   Fecal Occult Bld NEGATIVE NEGATIVE  Glucose, capillary     Status: Abnormal   Collection Time: 08/01/14  4:52 PM  Result Value Ref Range   Glucose-Capillary 227 (H) 70 - 99 mg/dL  Glucose, capillary     Status: Abnormal   Collection Time: 08/01/14  9:49 PM  Result Value Ref Range   Glucose-Capillary 178 (H) 70 - 99 mg/dL   Comment 1 Notify RN   Glucose, capillary     Status: Abnormal   Collection Time: 08/02/14  3:51 AM  Result Value Ref Range   Glucose-Capillary 255 (H) 70 - 99 mg/dL   Comment 1 Notify RN   Glucose, capillary     Status: Abnormal   Collection Time: 08/02/14  7:00 AM  Result Value Ref Range   Glucose-Capillary 209 (H) 70 - 99 mg/dL  Glucose, capillary     Status: Abnormal   Collection Time: 08/02/14 12:03 PM  Result Value Ref Range   Glucose-Capillary 225 (H) 70 - 99 mg/dL  CBC     Status: Abnormal   Collection Time: 08/02/14  3:18 PM  Result Value Ref Range   WBC 9.6 4.0 - 10.5 K/uL   RBC 2.58 (L) 3.87 - 5.11 MIL/uL   Hemoglobin 8.3 (L) 12.0 - 15.0 g/dL   HCT 26.0 (L) 36.0 - 46.0 %   MCV 100.8 (H) 78.0 - 100.0 fL   MCH 32.2 26.0 - 34.0 pg   MCHC 31.9 30.0 - 36.0 g/dL   RDW 19.8 (H) 11.5 - 15.5 %   Platelets 235 150 - 400 K/uL  Renal function panel     Status: Abnormal   Collection Time: 08/02/14  3:18 PM  Result Value Ref Range   Sodium 137 137 - 147 mEq/L   Potassium 4.4 3.7 - 5.3 mEq/L   Chloride 94 (L) 96 - 112 mEq/L   CO2 25 19 - 32 mEq/L   Glucose, Bld 81 70 - 99 mg/dL   BUN 52 (H) 6 - 23 mg/dL   Creatinine, Ser 5.09 (H) 0.50 - 1.10 mg/dL   Calcium 9.6 8.4 - 10.5 mg/dL   Phosphorus 2.8 2.3 - 4.6 mg/dL   Albumin 3.2 (L) 3.5 - 5.2 g/dL   GFR calc non Af Amer 8 (L) >90 mL/min   GFR calc Af Amer 9 (L) >90 mL/min    Comment: (NOTE) The eGFR has been calculated using the CKD EPI equation. This calculation has not been validated in  all clinical situations. eGFR's persistently <90 mL/min signify possible Chronic  Kidney Disease.    Anion gap 18 (H) 5 - 15  Type and screen     Status: None   Collection Time: 08/02/14  4:05 PM  Result Value Ref Range   ABO/RH(D) O POS    Antibody Screen NEG    Sample Expiration 08/05/2014    Unit Number [id number]    Blood Component Type RBC LR PHER2    Unit division 00    Status of Unit ISSUED,FINAL    Transfusion Status OK TO TRANSFUSE    Crossmatch Result Compatible   Prepare RBC     Status: None   Collection Time: 08/02/14  4:05 PM  Result Value Ref Range   Order Confirmation ORDER PROCESSED BY BLOOD BANK   Glucose, capillary     Status: Abnormal   Collection Time: 08/02/14  8:55 PM  Result Value Ref Range   Glucose-Capillary 119 (H) 70 - 99 mg/dL   Comment 1 Notify RN   CBC     Status: Abnormal   Collection Time: 08/03/14  6:08 AM  Result Value Ref Range   WBC 9.8 4.0 - 10.5 K/uL   RBC 2.82 (L) 3.87 - 5.11 MIL/uL   Hemoglobin 8.6 (L) 12.0 - 15.0 g/dL   HCT 27.5 (L) 36.0 - 46.0 %   MCV 97.5 78.0 - 100.0 fL   MCH 30.5 26.0 - 34.0 pg   MCHC 31.3 30.0 - 36.0 g/dL   RDW 20.3 (H) 11.5 - 15.5 %   Platelets 229 150 - 400 K/uL  Glucose, capillary     Status: Abnormal   Collection Time: 08/03/14  7:02 AM  Result Value Ref Range   Glucose-Capillary 114 (H) 70 - 99 mg/dL     HEENT: normal Cardio: RRR and no murmur Resp: CTA B/L and unlabored GI: BS positive and NT, ND Extremity:  Pulses positive and No Edema Skin:   Intact Neuro: Alert/Oriented Musc/Skel:  Swelling Left arm, with good thrill at AV graft site Gen NAD   Assessment/Plan: 1. Functional deficits secondary to Critical illness myopathy which require 3+ hours per day of interdisciplinary therapy in a comprehensive inpatient rehab setting. Physiatrist is providing close team supervision and 24 hour management of active medical problems listed below. Physiatrist and rehab team continue to assess barriers to discharge/monitor patient progress toward functional and medical goals. Team  conference today please see physician documentation under team conference tab, met with team face-to-face to discuss problems,progress, and goals. Formulized individual treatment plan based on medical history, underlying problem and comorbidities. FIM: FIM - Bathing Bathing Steps Patient Completed: Chest, Right Arm, Left Arm, Abdomen, Right upper leg, Left upper leg, Front perineal area Bathing: 3: Mod-Patient completes 5-7 [id number] parts or 50-74%  FIM - Upper Body Dressing/Undressing Upper body dressing/undressing steps patient completed: Thread/unthread right sleeve of pullover shirt/dresss, Put head through opening of pull over shirt/dress, Thread/unthread left sleeve of pullover shirt/dress, Pull shirt over trunk Upper body dressing/undressing: 5: Set-up assist to: Obtain clothing/put away FIM - Lower Body Dressing/Undressing Lower body dressing/undressing: 1: Two helpers  FIM - Toileting Toileting steps completed by patient: Performs perineal hygiene Toileting: 0: Activity did not occur  FIM - TRadio producerDevices: Bedside commode, Sliding board Toilet Transfers: 2-To toilet/BSC: Max A (lift and lower assist)  FIM - BControl and instrumentation engineerDevices: Arm rests (// bars) Bed/Chair Transfer: 1: Two helpers  FIM - Locomotion: Wheelchair Distance:  40 Locomotion: Wheelchair: 1: Travels less than 50 ft with minimal assistance (Pt.>75%) FIM - Locomotion: Ambulation Locomotion: Ambulation: 0: Activity did not occur  Comprehension Comprehension Mode: Auditory Comprehension: 5-Follows basic conversation/direction: With extra time/assistive device  Expression Expression Mode: Verbal Expression: 5-Expresses basic needs/ideas: With no assist  Social Interaction Social Interaction: 4-Interacts appropriately 75 - 89% of the time - Needs redirection for appropriate language or to initiate interaction.  Problem Solving Problem Solving:  4-Solves basic 75 - 89% of the time/requires cueing 10 - 24% of the time  Memory Memory: 4-Recognizes or recalls 75 - 89% of the time/requires cueing 10 - 24% of the time  Medical Problem List and Plan: 1. Functional deficits secondary to Critical illness myopathy.   2.  DVT Prophylaxis/Anticoagulation: Pharmaceutical: Lovenox, pt refusing drop in Hgb will check stool guaic and hold, use SCDs 3. Pain Management: Prn oxycodone.   4. Mood: LCSW to follow for evaluation and support. Team to provide egosupport as well.   5. Neuropsych: This patient is capable of making decisions on her own behalf. 6. Skin/Wound Care: continue foam dressing to sacrum. Order Prevalon boot for right foot as well as elevation when in bed.   7. Fluids/Electrolytes/Nutrition: Strict I/O. 1200 FR due to ESRD. 8. ESRD: HD after therapy day. Nephrology to manage dialysis,               -AVG site tender but functioning, no signs of infection,monitor 9. CAD: metoprolol, ASA, monitor clinically with functional activities 10. A fib: rate controlled at present. Continue metoprolol   12. DM type II: sliding scale insulin. Check cbg's ac and hs             -on amaryl at home, will resume since CBGs above 200 13.  Heme + stool, microscopic, monitor for frank red blood, likely small anal fissure or hemorrhoid plus lovenox, will monitor, transfused in HD  LOS (Days) 5 A FACE TO FACE EVALUATION WAS PERFORMED  KIRSTEINS,ANDREW E 08/03/2014, 9:20 AM

## 2014-08-03 NOTE — Progress Notes (Signed)
Occupational Therapy Session Note  Patient Details  Name: Briana Gould MRN: [id number] Date of Birth: Nov 22, 1945  Today's Date: 08/03/2014 OT Individual Time: 1500-1530 OT Individual Time Calculation (min): 30 min    Short Term Goals: Week 1:  OT Short Term Goal 1 (Week 1): Pt would be able to transfer to and from BSC/ toliet with mod A  OT Short Term Goal 2 (Week 1): Pt would tolerate prosethesis on during transfer training OT Short Term Goal 3 (Week 1): Pt would be able to perform toielting after BM with mod A  OT Short Term Goal 4 (Week 1): Pt will perform UB dressing with setup   Skilled Therapeutic Interventions/Progress Updates:  Upon entering the room, pt transitioning from physical therapy session. Pt with no c/o pain but reports extreme exhaustion. Pt agreeable to OT intervention with coaxing this session. She did appear to SOB but O2 remained above 90% during session. Pt requested O2 be placed on as she continued to be SOB. OT educated pt on energy conservation for ADL tasks, general principles, and community mobility( Dr. Visit). Pt was able to provide examples and engage in conversation demonstrating her understanding of information. Pt propelled wheelchair ~ 75 feet with increased time, foot, and B hands towards therapy gym with 3 rest breaks secondary to fatigue. Therapist assisted pt the rest of the way to gym to transition to PT make up session.   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

## 2014-08-03 NOTE — Plan of Care (Signed)
Problem: RH SAFETY Goal: RH STG ADHERE TO SAFETY PRECAUTIONS W/ASSISTANCE/DEVICE STG Adhere to Safety Precautions With min Assistance/Device.  Outcome: Progressing     

## 2014-08-03 NOTE — Plan of Care (Signed)
Problem: RH SAFETY Goal: RH STG DECREASED RISK OF FALL WITH ASSISTANCE STG Decreased Risk of Fall With min Assistance.  Outcome: Progressing     

## 2014-08-03 NOTE — Progress Notes (Signed)
Speech Language Pathology Daily Session Note  Patient Details  Name: Aspen Barbaraann BoysS Burningham MRN: [id number] Date of Birth: January 01, 1946  Today's Date: 08/03/2014 SLP Individual Time: [id number] SLP Individual Time Calculation (min): 45 min  Short Term Goals: Week 1: SLP Short Term Goal 1 (Week 1): Pt will tolerate her currently prescribed diet with no overt s/s of aspiration and supervision cues for use of swallowing precautions.  SLP Short Term Goal 2 (Week 1): Patient will utilize external memory aids to recall new, daily information with supervision multimodal cues.  SLP Short Term Goal 3 (Week 1): Patient will demonstrate functional problem solving for mildly complex tasks with supervision multimodal cues.  SLP Short Term Goal 4 (Week 1): Patient will self-monitor and correct errors during functional tasks with supervision question cues.   Skilled Therapeutic Interventions:  Pt was seen for skilled ST targeting goals for dysphagia and cognition.  Upon arrival, pt was seated upright in wheelchair, awake, alert, and agreeable to participate in ST.  SLP completed skilled observations during trials of liquids without chin tuck as RN and nurse tech report that pt is non compliant with chin tuck during meals.  No overt s/s of aspiration were noted with the abovementioned trials.  Recommend discharging chin tuck with continued use of slow rate and small bites/sips to minimize overt s/s of aspiration.  Also recommend that pt be upgraded from full supervision to intermittent supervision.  SLP also facilitated the session with continued practice with medication management.  Pt recalled location of medication list with supervision question cues.  Pt also required overall mod assist to load medications of varying dosages and frequencies into a pill box due to decreased working memory and error awareness as well as increased processing time and cuing for initiation.  Continue per current plan of care.   FIM:   Comprehension Comprehension Mode: Auditory Comprehension: 5-Follows basic conversation/direction: With extra time/assistive device Expression Expression Mode: Verbal Expression: 5-Expresses basic needs/ideas: With no assist Social Interaction Social Interaction: 4-Interacts appropriately 75 - 89% of the time - Needs redirection for appropriate language or to initiate interaction. Problem Solving Problem Solving: 3-Solves basic 50 - 74% of the time/requires cueing 25 - 49% of the time Memory Memory: 4-Recognizes or recalls 75 - 89% of the time/requires cueing 10 - 24% of the time FIM - Eating Eating Activity: 6: More than reasonable amount of time  Pain Pain Assessment Pain Assessment: No/denies pain Pain Score: 0-No pain  Therapy/Group: Individual Therapy   Jackalyn LombardNicole Qamar Aughenbaugh, M.A. CCC-SLP  Watt Geiler, Melanee SpryNicole L 08/03/2014, 12:20 PM

## 2014-08-04 LAB — CBC
HEMATOCRIT: 27.9 % — AB (ref 36.0–46.0)
Hemoglobin: 8.8 g/dL — ABNORMAL LOW (ref 12.0–15.0)
MCH: 31.1 pg (ref 26.0–34.0)
MCHC: 31.5 g/dL (ref 30.0–36.0)
MCV: 98.6 fL (ref 78.0–100.0)
Platelets: 232 10*3/uL (ref 150–400)
RBC: 2.83 MIL/uL — ABNORMAL LOW (ref 3.87–5.11)
RDW: 20 % — AB (ref 11.5–15.5)
WBC: 9.8 10*3/uL (ref 4.0–10.5)

## 2014-08-04 LAB — GLUCOSE, CAPILLARY
GLUCOSE-CAPILLARY: 211 mg/dL — AB (ref 70–99)
Glucose-Capillary: 93 mg/dL (ref 70–99)
Glucose-Capillary: 180 mg/dL — ABNORMAL HIGH (ref 70–99)
Glucose-Capillary: 152 mg/dL — ABNORMAL HIGH (ref 70–99)

## 2014-08-04 NOTE — Plan of Care (Signed)
Problem: RH BOWEL ELIMINATION Goal: RH STG MANAGE BOWEL WITH ASSISTANCE STG Manage Bowel with min Assistance.  Outcome: Progressing  Problem: RH SAFETY Goal: RH STG ADHERE TO SAFETY PRECAUTIONS W/ASSISTANCE/DEVICE STG Adhere to Safety Precautions With min Assistance/Device.  Outcome: Progressing  Problem: RH COGNITION-NURSING Goal: RH STG USES MEMORY AIDS/STRATEGIES W/ASSIST TO PROBLEM SOLVE STG Uses Memory Aids/Strategies With Assistance to Problem Solve.  Outcome: Progressing  Problem: RH PAIN MANAGEMENT Goal: RH STG PAIN MANAGED AT OR BELOW PT'S PAIN GOAL Less than 3 out of 10  Outcome: Progressing

## 2014-08-04 NOTE — Plan of Care (Signed)
Problem: RH BOWEL ELIMINATION Goal: RH STG MANAGE BOWEL WITH ASSISTANCE STG Manage Bowel with min Assistance.  Outcome: Progressing Goal: RH STG MANAGE BOWEL W/MEDICATION W/ASSISTANCE STG Manage Bowel with Medication with min Assistance.  Outcome: Progressing  Problem: RH SAFETY Goal: RH STG ADHERE TO SAFETY PRECAUTIONS W/ASSISTANCE/DEVICE STG Adhere to Safety Precautions With min Assistance/Device.  Outcome: Progressing Goal: RH STG DECREASED RISK OF FALL WITH ASSISTANCE STG Decreased Risk of Fall With min Assistance.  Outcome: Progressing  Problem: RH COGNITION-NURSING Goal: RH STG USES MEMORY AIDS/STRATEGIES W/ASSIST TO PROBLEM SOLVE STG Uses Memory Aids/Strategies With Assistance to Problem Solve.  Outcome: Progressing Goal: RH STG ANTICIPATES NEEDS/CALLS FOR ASSIST W/ASSIST/CUES STG Anticipates Needs/Calls for Assist With Assistance/Cues.  Outcome: Progressing  Problem: RH PAIN MANAGEMENT Goal: RH STG PAIN MANAGED AT OR BELOW PT'S PAIN GOAL Less than 3 out of 10  Outcome: Progressing  Problem: RH KNOWLEDGE DEFICIT Goal: RH STG INCREASE KNOWLEDGE OF DIABETES Patient will verbalize management to diabetes to include diet, exercise, medications, and signs and symptoms of hypo- and hyper- glycemia with min assistance  Outcome: Progressing

## 2014-08-04 NOTE — Progress Notes (Signed)
Subjective/Complaints: 68 year old female with h/o severe multivessel CAD (not candidate for percutaneous intervention), ESRD on HD, and PAD s/p R- BKA, h/o prior cardiac arrest X 2; who was admitted to on 06/27/2014 due to cardiopulmonary arrest past having HD cath placed at outpatient center. She received CPR for 2 minutes with return of spontaneous circulation and was unresponsive w/ ineffective ventilation and posturing on arrival to ED. She was placed under the hypothermia protocol. 2D echo with EF 45-50% with mild LVF and elevated pulmonary pressures. On 06/29/2014 she was rewarmed and extubated but remained lethargic and nonverbal with concerns of anoxic encephalopathy. CT head negative. EEG done showing diffuse cerebral disturbance--non-specific. CXR on 10/22 with LLL HCAP and patient has completed her antibiotic course. Cardiology consulted for input and recommended medical treatment as EKG and troponin negative. PCCM felt that arrest most likely due to baseline hypotension and pulmonary hypertension. A fib treated with IV Cardizem with recommendations to transition to oral BB with close monitoring as due to patient's h/o hypotension past HD. Swallow evaluation with evidence on dysphagia and patient placed on D3  Nectar liquids   Feeling well. Getting stronger. Sitting eob waiting for breakfast. Review of Systems - otherwise neg Objective: Vital Signs: Blood pressure 136/63, pulse 73, temperature 98.4 F (36.9 C), temperature source Oral, resp. rate 17, weight 85.7 kg (188 lb 15 oz), SpO2 99 %. No results found. Results for orders placed or performed during the hospital encounter of 07/29/14 (from the past 72 hour(s))  Occult blood, poc device     Status: Abnormal   Collection Time: 08/01/14 11:38 AM  Result Value Ref Range   Fecal Occult Bld POSITIVE (A) NEGATIVE  Glucose, capillary     Status: Abnormal   Collection Time: 08/01/14 11:52 AM  Result Value Ref Range   Glucose-Capillary  261 (H) 70 - 99 mg/dL  Occult blood card to lab, stool RN will collect     Status: None   Collection Time: 08/01/14  1:42 PM  Result Value Ref Range   Fecal Occult Bld NEGATIVE NEGATIVE  Glucose, capillary     Status: Abnormal   Collection Time: 08/01/14  4:52 PM  Result Value Ref Range   Glucose-Capillary 227 (H) 70 - 99 mg/dL  Glucose, capillary     Status: Abnormal   Collection Time: 08/01/14  9:49 PM  Result Value Ref Range   Glucose-Capillary 178 (H) 70 - 99 mg/dL   Comment 1 Notify RN   Glucose, capillary     Status: Abnormal   Collection Time: 08/02/14  3:51 AM  Result Value Ref Range   Glucose-Capillary 255 (H) 70 - 99 mg/dL   Comment 1 Notify RN   Glucose, capillary     Status: Abnormal   Collection Time: 08/02/14  7:00 AM  Result Value Ref Range   Glucose-Capillary 209 (H) 70 - 99 mg/dL  Glucose, capillary     Status: Abnormal   Collection Time: 08/02/14 12:03 PM  Result Value Ref Range   Glucose-Capillary 225 (H) 70 - 99 mg/dL  CBC     Status: Abnormal   Collection Time: 08/02/14  3:18 PM  Result Value Ref Range   WBC 9.6 4.0 - 10.5 K/uL   RBC 2.58 (L) 3.87 - 5.11 MIL/uL   Hemoglobin 8.3 (L) 12.0 - 15.0 g/dL   HCT 26.0 (L) 36.0 - 46.0 %   MCV 100.8 (H) 78.0 - 100.0 fL   MCH 32.2 26.0 - 34.0 pg   MCHC 31.9  30.0 - 36.0 g/dL   RDW 19.8 (H) 11.5 - 15.5 %   Platelets 235 150 - 400 K/uL  Renal function panel     Status: Abnormal   Collection Time: 08/02/14  3:18 PM  Result Value Ref Range   Sodium 137 137 - 147 mEq/L   Potassium 4.4 3.7 - 5.3 mEq/L   Chloride 94 (L) 96 - 112 mEq/L   CO2 25 19 - 32 mEq/L   Glucose, Bld 81 70 - 99 mg/dL   BUN 52 (H) 6 - 23 mg/dL   Creatinine, Ser 5.09 (H) 0.50 - 1.10 mg/dL   Calcium 9.6 8.4 - 10.5 mg/dL   Phosphorus 2.8 2.3 - 4.6 mg/dL   Albumin 3.2 (L) 3.5 - 5.2 g/dL   GFR calc non Af Amer 8 (L) >90 mL/min   GFR calc Af Amer 9 (L) >90 mL/min    Comment: (NOTE) The eGFR has been calculated using the CKD EPI equation. This  calculation has not been validated in all clinical situations. eGFR's persistently <90 mL/min signify possible Chronic Kidney Disease.    Anion gap 18 (H) 5 - 15  Type and screen     Status: None   Collection Time: 08/02/14  4:05 PM  Result Value Ref Range   ABO/RH(D) O POS    Antibody Screen NEG    Sample Expiration 08/05/2014    Unit Number [id number]    Blood Component Type RBC LR PHER2    Unit division 00    Status of Unit ISSUED,FINAL    Transfusion Status OK TO TRANSFUSE    Crossmatch Result Compatible   Prepare RBC     Status: None   Collection Time: 08/02/14  4:05 PM  Result Value Ref Range   Order Confirmation ORDER PROCESSED BY BLOOD BANK   Glucose, capillary     Status: Abnormal   Collection Time: 08/02/14  8:55 PM  Result Value Ref Range   Glucose-Capillary 119 (H) 70 - 99 mg/dL   Comment 1 Notify RN   CBC     Status: Abnormal   Collection Time: 08/03/14  6:08 AM  Result Value Ref Range   WBC 9.8 4.0 - 10.5 K/uL   RBC 2.82 (L) 3.87 - 5.11 MIL/uL   Hemoglobin 8.6 (L) 12.0 - 15.0 g/dL   HCT 27.5 (L) 36.0 - 46.0 %   MCV 97.5 78.0 - 100.0 fL   MCH 30.5 26.0 - 34.0 pg   MCHC 31.3 30.0 - 36.0 g/dL   RDW 20.3 (H) 11.5 - 15.5 %   Platelets 229 150 - 400 K/uL  Glucose, capillary     Status: Abnormal   Collection Time: 08/03/14  7:02 AM  Result Value Ref Range   Glucose-Capillary 114 (H) 70 - 99 mg/dL  Glucose, capillary     Status: Abnormal   Collection Time: 08/03/14 11:26 AM  Result Value Ref Range   Glucose-Capillary 189 (H) 70 - 99 mg/dL  Glucose, capillary     Status: Abnormal   Collection Time: 08/03/14  4:17 PM  Result Value Ref Range   Glucose-Capillary 191 (H) 70 - 99 mg/dL  Glucose, capillary     Status: Abnormal   Collection Time: 08/03/14  9:01 PM  Result Value Ref Range   Glucose-Capillary 165 (H) 70 - 99 mg/dL  CBC     Status: Abnormal   Collection Time: 08/04/14  5:44 AM  Result Value Ref Range   WBC 9.8 4.0 - 10.5 K/uL  RBC 2.83 (L)  3.87 - 5.11 MIL/uL   Hemoglobin 8.8 (L) 12.0 - 15.0 g/dL   HCT 27.9 (L) 36.0 - 46.0 %   MCV 98.6 78.0 - 100.0 fL   MCH 31.1 26.0 - 34.0 pg   MCHC 31.5 30.0 - 36.0 g/dL   RDW 20.0 (H) 11.5 - 15.5 %   Platelets 232 150 - 400 K/uL  Glucose, capillary     Status: None   Collection Time: 08/04/14  6:50 AM  Result Value Ref Range   Glucose-Capillary 93 70 - 99 mg/dL     HEENT: normal Cardio: RRR and no murmur Resp: CTA B/L and unlabored GI: BS positive and NT, ND Extremity:  Pulses positive and trace LE Edema Skin:   Intact Neuro: Alert/Oriented. Improved trunk control Musc/Skel:  Swelling Left arm, with good thrill at AV graft site Gen NAD   Assessment/Plan: 1. Functional deficits secondary to Critical illness myopathy which require 3+ hours per day of interdisciplinary therapy in a comprehensive inpatient rehab setting. Physiatrist is providing close team supervision and 24 hour management of active medical problems listed below. Physiatrist and rehab team continue to assess barriers to discharge/monitor patient progress toward functional and medical goals. Team conference today please see physician documentation under team conference tab, met with team face-to-face to discuss problems,progress, and goals. Formulized individual treatment plan based on medical history, underlying problem and comorbidities. FIM: FIM - Bathing Bathing Steps Patient Completed: Chest, Right Arm, Left Arm, Abdomen, Right upper leg, Left upper leg, Front perineal area Bathing: 3: Mod-Patient completes 5-7 60f10 parts or 50-74%  FIM - Upper Body Dressing/Undressing Upper body dressing/undressing steps patient completed: Thread/unthread right sleeve of pullover shirt/dresss, Put head through opening of pull over shirt/dress, Thread/unthread left sleeve of pullover shirt/dress, Pull shirt over trunk Upper body dressing/undressing: 5: Set-up assist to: Obtain clothing/put away FIM - Lower Body  Dressing/Undressing Lower body dressing/undressing: 1: Total-Patient completed less than 25% of tasks  FIM - Toileting Toileting steps completed by patient: Performs perineal hygiene Toileting: 0: Activity did not occur  FIM - TRadio producerDevices: Bedside commode, Sliding board Toilet Transfers: 2-To toilet/BSC: Max A (lift and lower assist)  FIM - BControl and instrumentation engineerDevices: Sliding board, Arm rests Bed/Chair Transfer: 0: Activity did not occur  FIM - Locomotion: Wheelchair Distance: 75 Locomotion: Wheelchair: 1: Travels less than 50 ft with supervision, cueing or coaxing FIM - Locomotion: Ambulation Ambulation/Gait Assistance: Not tested (comment) Locomotion: Ambulation: 0: Activity did not occur  Comprehension Comprehension Mode: Auditory Comprehension: 5-Understands basic 90% of the time/requires cueing < 10% of the time  Expression Expression Mode: Verbal Expression: 5-Expresses basic 90% of the time/requires cueing < 10% of the time.  Social Interaction Social Interaction: 4-Interacts appropriately 75 - 89% of the time - Needs redirection for appropriate language or to initiate interaction.  Problem Solving Problem Solving: 4-Solves basic 75 - 89% of the time/requires cueing 10 - 24% of the time  Memory Memory: 4-Recognizes or recalls 75 - 89% of the time/requires cueing 10 - 24% of the time  Medical Problem List and Plan: 1. Functional deficits secondary to Critical illness myopathy.   2.  DVT Prophylaxis/Anticoagulation: Pharmaceutical: Lovenox, pt refusing drop in Hgb will check stool guaic and hold, use SCDs 3. Pain Management: Prn oxycodone.   4. Mood: LCSW to follow for evaluation and support. Team to provide egosupport as well.   5. Neuropsych: This patient is capable of making decisions  on her own behalf. 6. Skin/Wound Care: continue foam dressing to sacrum. Order Prevalon boot for right foot as  well as elevation when in bed.   7. Fluids/Electrolytes/Nutrition: Strict I/O. 1200 FR due to ESRD. 8. ESRD: HD after therapy day. Nephrology to manage dialysis,               -AVG site tender but intact 9. CAD: metoprolol, ASA, monitor clinically with functional activities 10. A fib: rate controlled at present. Continue metoprolol   12. DM type II: sliding scale insulin. Check cbg's ac and hs             -on amaryl at home, will resume since CBGs above 200 13.  Heme + stool, microscopic, monitor for frank red blood, likely small anal fissure or hemorrhoid plus lovenox, will monitor, transfused in HD  -serial H/H  LOS (Days) 6 A FACE TO FACE EVALUATION WAS PERFORMED  Briana Gould T 08/04/2014, 7:45 AM

## 2014-08-04 NOTE — Progress Notes (Signed)
[facility] Rounding Note Subjective:   Had a good night Sitting on the edge of the bed No CP  Objective: Vital signs in last 24 hours: Temp:  [98.2 F (36.8 C)-98.4 F (36.9 C)] 98.4 F (36.9 C) (11/26 0523) Pulse Rate:  [36-79] 73 (11/26 0638) Resp:  [17] 17 (11/26 0523) BP: (127-141)/(45-64) 136/63 mmHg (11/26 0638) SpO2:  [94 %-100 %] 99 % (11/26 0736) Weight:  [85.7 kg (188 lb 15 oz)] 85.7 kg (188 lb 15 oz) (11/26 0523) Weight change: 2.2 kg (4 lb 13.6 oz)  EXAM: General appearance:  Alert, in no apparent distress Resp:  CTA without rales, rhonchi, or wheezes Cardio:  RRR without murmur or rub GI: + BS, soft and nontender Extremities:  1+ edema on RLE, L BKA with scabbing at incision No drainage or pain  Access:  L IJ catheter dressing in place , AVF @ LUA with + bruit, but firm hematomas preventing use.  (Very hard firm hematoma underside of arm medial to access, smaller hard hematoma lateral to the AVF with is patent with palp thrill and audible bruit)  Lab Results:  Recent Labs  08/03/14 0608 08/04/14 0544  WBC 9.8 9.8  HGB 8.6* 8.8*  HCT 27.5* 27.9*  PLT 229 232    Recent Labs  08/02/14 1518  NA 137  K 4.4  CL 94*  CO2 25  GLUCOSE 81  BUN 52*  CREATININE 5.09*  CALCIUM 9.6  ALBUMIN 3.2*   Scheduled Medications . sodium chloride   Intravenous Once  . aspirin EC  81 mg Oral Daily  . darbepoetin (ARANESP) injection - DIALYSIS  150 mcg Intravenous Q Wed-HD  . docusate sodium  100 mg Oral BID  . doxercalciferol  5 mcg Intravenous Q M,W,F-HD  . feeding supplement (NEPRO CARB STEADY)  237 mL Oral TID WC  . [START ON 08/10/2014] ferric gluconate (FERRLECIT/NULECIT) IV  62.5 mg Intravenous Q Wed-HD  . fluticasone  2 puff Inhalation BID  . glimepiride  2 mg Oral Q breakfast  . guaiFENesin  600 mg Oral BID  . hydrocerin   Topical BID  . insulin aspart  0-15 Units Subcutaneous TID WC  . metoCLOPramide  10 mg Oral TID AC & HS  . metoprolol  tartrate  12.5 mg Oral 4 times per day on Sun Tue Thu Sat  . midodrine  5 mg Oral Q M,W,F-HD  . multivitamin  1 tablet Oral QHS  . pantoprazole  40 mg Oral Daily  . polyethylene glycol  17 g Oral Daily  . prednisoLONE acetate  1 drop Both Eyes TID AC  . sevelamer carbonate  800 mg Oral TID WC    Dialysis Orders: MWF [facility] (from prior to 06/27/14 admission post cardiac arrest)  75kgs 2K/2Ca 4hours 5000 Heparin Left IJ L AVF 350/1.5 Limit UF to 3 liters/treatment hectorol 2 Aranesp 25this has been increased to 150 with adm back to rehab unit Venofer 50 q week Profile 4   BACKGROUND 68 y.o. AA female with DM, ESRD on HD, PAD, left BKA, severe know CAD (not surgical candidate), h/o CVA originally admitted 10/19 after cardiac arrest while getting HD cath placed (due to hematoma AVF), when she became pulseless and received 2 mins CPR, this was her 3rd cardiac arrest. She was unresponsive upon admission,required intubation. She has followed with cardiology, is not a candidate for PCI, remains on medical therapy. She was treated for HCAP that admission with vanc and ceftaz. She was discharged to [facility]  10/29 until 11/20, she is now in inpatient rehab. Has been tolerating HD well on midodrine, progressing with PT and feeling better.   Assessment/Plan: 1. Critical illness myopathy - rehab since 11/20. Progressing. 2. A-fib - not a candidate for anticoagulation, HR controlled on Metoprolol  3. ESRD - HD on MWF @ [facility].  Keep BFR 350, 3 liters off max to minimize cardiac stress as much as possible. Midodrine. She is now about 8 kg above her outpt EDW - came out of [facility] very overloaded (10 kg up). We are slowly taking her weight back down as tolerated 4. BP/volume - BP support with midodrine; 8 kgs over previous outpt edw- sig RLE edema, but cramping s/p net UF only 1.8 L last TMT 5. Anemia - Hgb up to 8.6 after a unit of PRBC's with HD 11/24 - not as much of a rise as  I would anticipate, Aranesp increased to 150 mcg 11/24 and received dose in HD.  Low threshold for additional transfusion since such sig CAD, TSat 29% with Fe of 57 on 11/22. 6. Metabolic bone disease - Ca 9.6 (10.4 corrected), P 2.8, PTH 769 (11/4) cont Hectorol, reduced Renvela to 1 with meals. 7. Nutrition - Alb 3.2, Nepro, renal diet.  8. DM - per primary, A1c 6.5 (11/20) 9. Left AVF hematoma - shuntogram 11/4 patent AV access; 2 hematomas still present on Duplex from 11/14. On exam hematomas are rock hard and would preclude cannulation of the access although bruit auscultated. [facility]. 10. Constipation - laxatives 11. Cardiac Arrest- 3rd episode 10/19.  Medical management for severe un-revascularizable multivessel CAD 12. Heme + stool - 1 of 2 + on 11/23 (when Hb drop to 8.1). No frank bleeding.  Monitor. Consider additional PRBC's if still in the 8's next HD (due to severe CAD) - if less than 9 on Friday will give a unit.   LOS: 6 days   Camille Balynthia Aniel Hubble, MD [facility] [phone] Pager 08/04/2014, 8:14 AM

## 2014-08-05 ENCOUNTER — Inpatient Hospital Stay (HOSPITAL_COMMUNITY): Payer: BC Managed Care – PPO | Admitting: Speech Pathology

## 2014-08-05 ENCOUNTER — Inpatient Hospital Stay (HOSPITAL_COMMUNITY): Payer: BC Managed Care – PPO | Admitting: *Deleted

## 2014-08-05 ENCOUNTER — Inpatient Hospital Stay (HOSPITAL_COMMUNITY): Payer: Medicare Other | Admitting: Occupational Therapy

## 2014-08-05 LAB — RENAL FUNCTION PANEL
ANION GAP: 20 — AB (ref 5–15)
Albumin: 3.4 g/dL — ABNORMAL LOW (ref 3.5–5.2)
BUN: 72 mg/dL — ABNORMAL HIGH (ref 6–23)
CALCIUM: 9.4 mg/dL (ref 8.4–10.5)
CO2: 23 meq/L (ref 19–32)
Chloride: 93 mEq/L — ABNORMAL LOW (ref 96–112)
Creatinine, Ser: 6.1 mg/dL — ABNORMAL HIGH (ref 0.50–1.10)
GFR, EST AFRICAN AMERICAN: 7 mL/min — AB (ref >90)
GFR, EST NON AFRICAN AMERICAN: 6 mL/min — AB (ref >90)
GLUCOSE: 176 mg/dL — AB (ref 70–99)
POTASSIUM: 4.9 meq/L (ref 3.7–5.3)
Phosphorus: 2.9 mg/dL (ref 2.3–4.6)
SODIUM: 136 meq/L — AB (ref 137–147)

## 2014-08-05 LAB — CBC
HCT: 28.6 % — ABNORMAL LOW (ref 36.0–46.0)
HEMOGLOBIN: 9.1 g/dL — AB (ref 12.0–15.0)
MCH: 31.7 pg (ref 26.0–34.0)
MCHC: 31.8 g/dL (ref 30.0–36.0)
MCV: 99.7 fL (ref 78.0–100.0)
PLATELETS: 250 10*3/uL (ref 150–400)
RBC: 2.87 MIL/uL — AB (ref 3.87–5.11)
RDW: 19.7 % — ABNORMAL HIGH (ref 11.5–15.5)
WBC: 10.7 10*3/uL — ABNORMAL HIGH (ref 4.0–10.5)

## 2014-08-05 LAB — GLUCOSE, CAPILLARY
GLUCOSE-CAPILLARY: 135 mg/dL — AB (ref 70–99)
GLUCOSE-CAPILLARY: 197 mg/dL — AB (ref 70–99)
Glucose-Capillary: 114 mg/dL — ABNORMAL HIGH (ref 70–99)

## 2014-08-05 LAB — PREPARE RBC (CROSSMATCH)

## 2014-08-05 MED ORDER — SODIUM CHLORIDE 0.9 % IV SOLN: Freq: Once | INTRAVENOUS | Status: DC

## 2014-08-05 MED ORDER — DOXERCALCIFEROL 4 MCG/2ML IV SOLN
INTRAVENOUS | Status: AC
  Filled 2014-08-05: qty 4

## 2014-08-05 MED ORDER — ACETAMINOPHEN 325 MG PO TABS
ORAL_TABLET | ORAL | Status: AC
  Filled 2014-08-05: qty 2

## 2014-08-05 NOTE — Progress Notes (Signed)
Occupational Therapy Weekly Progress Note  Patient Details  Name: Briana Gould MRN: [id number] Date of Birth: 22-May-1946  Beginning of progress report period: July 30, 2014 End of progress report period: August 05, 2014  Today's Date: 08/05/2014 OT Individual Time: 8938-1017 OT Individual Time Calculation (min): 60 min    Patient has met 1 of 4 short term goals.  Pt has been making slow progress due to severe deconditioning and fatigue from her dialysis. She have very poor generalized strength and is unable to lift any of her body weight, therefore she was unable to meet many of her STGs. Next week's goals have been modified along with her LTGs.  Patient continues to demonstrate the following deficits:severe weakness and deconditioning, decreased memory and cognitive processing, delayed initiation and therefore will continue to benefit from skilled OT intervention to enhance overall performance with BADL.  Patient not progressing toward long term goals.  See goal revision..  Plan of care revisions: LTGs modified on 08/03/14 from mod I to min A..  OT Short Term Goals Week 1:  OT Short Term Goal 1 (Week 1): Pt would be able to transfer to and from BSC/ toliet with mod A  OT Short Term Goal 1 - Progress (Week 1): Not met OT Short Term Goal 2 (Week 1): Pt would tolerate prosethesis on during transfer training OT Short Term Goal 2 - Progress (Week 1): Not met OT Short Term Goal 3 (Week 1): Pt would be able to perform toielting after BM with mod A  OT Short Term Goal 3 - Progress (Week 1): Not met OT Short Term Goal 4 (Week 1): Pt will perform UB dressing with setup  OT Short Term Goal 4 - Progress (Week 1): Met Week 2:  OT Short Term Goal 1 (Week 2): Pt will complete slide board transfer to [facility] with mod A x 1. OT Short Term Goal 2 (Week 2): Pt will be able to don pants over both legs from supine position. OT Short Term Goal 3 (Week 2): Pt will be able to don underwear and/or pants  over hips with min A by rolling in bed. OT Short Term Goal 4 (Week 2): Pt will be able to lateral lean to both sides on BSC to be able to perform hygiene.  Skilled Therapeutic Interventions/Progress Updates:    Pt seen for BADL retraining of B/D from bed level to allow her to play a more active roll and develop overall strength with self mobility.  Pt was in w/c and worked on SB transfer with max A to bed. Pt needed guidance to lean forward and lean upper trunk in opposition to movement.  She is not able to use her arms to push up at all, so she is just sliding on board. From EOB, completed UB self care with fair sitting balance. Min A to supine to assist with R leg. Pt's LB self care completed from supine.  Pt wanted to use bed rails to assist with rolling. Instructed pt to lead movement with leg, arm and head. Pt was then able to fully roll on her side. Pt did assist with pulling underwear and pants up from her ankle level on R and did assist with partially pulling over her hips.  She then sat up to EOB with min A. Pt needed several rest breaks, but did demonstrate improved initiation from previous sessions. She completed SB transfer back to w/c. Pt then set up at sink to complete grooming. Pt stated she  would propel her chair back to be next to her bed by herself as session time was over.  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

## 2014-08-05 NOTE — Procedures (Signed)
I have personally attended this patient's dialysis session.   She only wants 2 kg pulled However, she is 85 kg which is 10 kg above her EDW Will UF as she will allow us to, with a max UF of 3 liters  Plan to transfuse 1 unit for Hb <9 Will also use albumin for BP support Has rec'd midodrine  Camille Balynthia Zayaan Kozak, MD [facility] [phone] Pager 08/05/2014, 4:22 PM

## 2014-08-05 NOTE — Progress Notes (Signed)
Speech Language Pathology Weekly Progress and Session Note  Patient Details  Name: Briana Gould MRN: [id number] Date of Birth: 1946-01-17  Beginning of progress report period: July 29, 2014 End of progress report period: August 05, 2014  Today's Date: 08/05/2014 SLP Individual Time: 1130-1200 SLP Individual Time Calculation (min): 30 min  Short Term Goals: Week 1: SLP Short Term Goal 1 (Week 1): Pt will tolerate her currently prescribed diet with no overt s/s of aspiration and supervision cues for use of swallowing precautions.  SLP Short Term Goal 1 - Progress (Week 1): Met SLP Short Term Goal 2 (Week 1): Patient will utilize external memory aids to recall new, daily information with supervision multimodal cues.  SLP Short Term Goal 2 - Progress (Week 1): Progressing toward goal SLP Short Term Goal 3 (Week 1): Patient will demonstrate functional problem solving for mildly complex tasks with supervision multimodal cues.  SLP Short Term Goal 3 - Progress (Week 1): Progressing toward goal SLP Short Term Goal 4 (Week 1): Patient will self-monitor and correct errors during functional tasks with supervision question cues.  SLP Short Term Goal 4 - Progress (Week 1): Progressing toward goal    New Short Term Goals: Week 2: SLP Short Term Goal 1 (Week 2): Pt will tolerate her currently prescribed diet with no overt s/s of aspiration and mod I use of swallowing precautions.  SLP Short Term Goal 2 (Week 2): Patient will utilize external memory aids to recall new, daily information with supervision multimodal cues.  SLP Short Term Goal 3 (Week 2): Patient will demonstrate functional problem solving for mildly complex tasks with supervision multimodal cues.  SLP Short Term Goal 4 (Week 2): Patient will self-monitor and correct errors during functional tasks with supervision question cues.   Weekly Progress Updates:  Pt made slow, functional gains this reporting periods and has met 1 out of 4  short term goals due to improved diet tolerance and use of universal swallowing precautions.  Currently, pt requires min assist for moderately complex cognitive tasks due to decreased working memory, planning, organization, mental flexibility, and error awareness for functional problem solving.  Pt also requires intermittent supervision for use of slow rate, small bites/sips, and sitting upright out of bed during meals to minimize overt s/s of aspiration with regular solids and thin liquids.  Pt would continue to benefit from skilled ST while inpatient in order to maximize functional independence and reduce burden of care upon discharge.  SLP also recommends family training prior to discharge as pt is a poor historian, likely with baseline cognitive impairments.  Anticipate that pt will require 24/7 supervision at discharge in addition to assistance for medication and financial management.  Goals updated on this date to reflect current progress and plan of care.     Intensity: Minumum of 1-2 x/day, 30 to 90 minutes Frequency: 5 out of 7 days Duration/Length of Stay: 14-17 days  Treatment/Interventions: Cognitive remediation/compensation;Cueing hierarchy;Functional tasks;Patient/family education;Therapeutic Activities;Internal/external aids;Environmental controls   Daily Session  Skilled Therapeutic Interventions: Pt was seen for skilled ST targeting cognitive goals.  Upon arrival, pt was seated upright at edge of bed, awake, alert, and agreeable to participate in ST with min encouragement.  SLP facilitated the session with continued practice with medication management.  Pt completed pill loading activity from previous 2 therapy sessions with min assist verbal cues from SLP for working memory and error awareness for ~75% accuracy.  SLP strongly recommended that pt have assistance for medication management at discharge  as it took her 3 days to complete task with therapy and still was noted to make errors.   Pt in agreement; however, SLP will continue to reinforce these recommendations with pt in therapy.            FIM:  Comprehension Comprehension Mode: Auditory Comprehension: 5-Follows basic conversation/direction: With extra time/assistive device Expression Expression Mode: Verbal Expression: 5-Expresses basic needs/ideas: With extra time/assistive device Social Interaction Social Interaction: 4-Interacts appropriately 75 - 89% of the time - Needs redirection for appropriate language or to initiate interaction. Problem Solving Problem Solving: 4-Solves basic 75 - 89% of the time/requires cueing 10 - 24% of the time Memory Memory: 4-Recognizes or recalls 75 - 89% of the time/requires cueing 10 - 24% of the time  Pain Pain Assessment Pain Assessment: No/denies pain Pain Score: 0-No pain  Therapy/Group: Individual Therapy   Windell Moulding, M.A. CCC-SLP  Page, Selinda Orion 08/05/2014, 11:57 AM

## 2014-08-05 NOTE — Progress Notes (Signed)
Subjective/Complaints:  HD for today, holiday schedule Brighter this am, no arm pain Review of Systems - otherwise neg Objective: Vital Signs: Blood pressure 122/60, pulse 65, temperature 98.7 F (37.1 C), temperature source Oral, resp. rate 20, weight 85.7 kg (188 lb 15 oz), SpO2 99 %. No results found. Results for orders placed or performed during the hospital encounter of 07/29/14 (from the past 72 hour(s))  Glucose, capillary     Status: Abnormal   Collection Time: 08/02/14 12:03 PM  Result Value Ref Range   Glucose-Capillary 225 (H) 70 - 99 mg/dL  CBC     Status: Abnormal   Collection Time: 08/02/14  3:18 PM  Result Value Ref Range   WBC 9.6 4.0 - 10.5 K/uL   RBC 2.58 (L) 3.87 - 5.11 MIL/uL   Hemoglobin 8.3 (L) 12.0 - 15.0 g/dL   HCT 26.0 (L) 36.0 - 46.0 %   MCV 100.8 (H) 78.0 - 100.0 fL   MCH 32.2 26.0 - 34.0 pg   MCHC 31.9 30.0 - 36.0 g/dL   RDW 19.8 (H) 11.5 - 15.5 %   Platelets 235 150 - 400 K/uL  Renal function panel     Status: Abnormal   Collection Time: 08/02/14  3:18 PM  Result Value Ref Range   Sodium 137 137 - 147 mEq/L   Potassium 4.4 3.7 - 5.3 mEq/L   Chloride 94 (L) 96 - 112 mEq/L   CO2 25 19 - 32 mEq/L   Glucose, Bld 81 70 - 99 mg/dL   BUN 52 (H) 6 - 23 mg/dL   Creatinine, Ser 5.09 (H) 0.50 - 1.10 mg/dL   Calcium 9.6 8.4 - 10.5 mg/dL   Phosphorus 2.8 2.3 - 4.6 mg/dL   Albumin 3.2 (L) 3.5 - 5.2 g/dL   GFR calc non Af Amer 8 (L) >90 mL/min   GFR calc Af Amer 9 (L) >90 mL/min    Comment: (NOTE) The eGFR has been calculated using the CKD EPI equation. This calculation has not been validated in all clinical situations. eGFR's persistently <90 mL/min signify possible Chronic Kidney Disease.    Anion gap 18 (H) 5 - 15  Type and screen     Status: None   Collection Time: 08/02/14  4:05 PM  Result Value Ref Range   ABO/RH(D) O POS    Antibody Screen NEG    Sample Expiration 08/05/2014    Unit Number [id number]    Blood Component Type RBC LR  PHER2    Unit division 00    Status of Unit ISSUED,FINAL    Transfusion Status OK TO TRANSFUSE    Crossmatch Result Compatible   Prepare RBC     Status: None   Collection Time: 08/02/14  4:05 PM  Result Value Ref Range   Order Confirmation ORDER PROCESSED BY BLOOD BANK   Glucose, capillary     Status: Abnormal   Collection Time: 08/02/14  8:55 PM  Result Value Ref Range   Glucose-Capillary 119 (H) 70 - 99 mg/dL   Comment 1 Notify RN   CBC     Status: Abnormal   Collection Time: 08/03/14  6:08 AM  Result Value Ref Range   WBC 9.8 4.0 - 10.5 K/uL   RBC 2.82 (L) 3.87 - 5.11 MIL/uL   Hemoglobin 8.6 (L) 12.0 - 15.0 g/dL   HCT 27.5 (L) 36.0 - 46.0 %   MCV 97.5 78.0 - 100.0 fL   MCH 30.5 26.0 - 34.0 pg   MCHC  31.3 30.0 - 36.0 g/dL   RDW 20.3 (H) 11.5 - 15.5 %   Platelets 229 150 - 400 K/uL  Glucose, capillary     Status: Abnormal   Collection Time: 08/03/14  7:02 AM  Result Value Ref Range   Glucose-Capillary 114 (H) 70 - 99 mg/dL  Glucose, capillary     Status: Abnormal   Collection Time: 08/03/14 11:26 AM  Result Value Ref Range   Glucose-Capillary 189 (H) 70 - 99 mg/dL  Glucose, capillary     Status: Abnormal   Collection Time: 08/03/14  4:17 PM  Result Value Ref Range   Glucose-Capillary 191 (H) 70 - 99 mg/dL  Glucose, capillary     Status: Abnormal   Collection Time: 08/03/14  9:01 PM  Result Value Ref Range   Glucose-Capillary 165 (H) 70 - 99 mg/dL  CBC     Status: Abnormal   Collection Time: 08/04/14  5:44 AM  Result Value Ref Range   WBC 9.8 4.0 - 10.5 K/uL   RBC 2.83 (L) 3.87 - 5.11 MIL/uL   Hemoglobin 8.8 (L) 12.0 - 15.0 g/dL   HCT 27.9 (L) 36.0 - 46.0 %   MCV 98.6 78.0 - 100.0 fL   MCH 31.1 26.0 - 34.0 pg   MCHC 31.5 30.0 - 36.0 g/dL   RDW 20.0 (H) 11.5 - 15.5 %   Platelets 232 150 - 400 K/uL  Glucose, capillary     Status: None   Collection Time: 08/04/14  6:50 AM  Result Value Ref Range   Glucose-Capillary 93 70 - 99 mg/dL  Glucose, capillary      Status: Abnormal   Collection Time: 08/04/14 11:26 AM  Result Value Ref Range   Glucose-Capillary 180 (H) 70 - 99 mg/dL  Glucose, capillary     Status: Abnormal   Collection Time: 08/04/14  5:07 PM  Result Value Ref Range   Glucose-Capillary 152 (H) 70 - 99 mg/dL  Glucose, capillary     Status: Abnormal   Collection Time: 08/04/14  8:52 PM  Result Value Ref Range   Glucose-Capillary 211 (H) 70 - 99 mg/dL  Glucose, capillary     Status: Abnormal   Collection Time: 08/05/14  6:57 AM  Result Value Ref Range   Glucose-Capillary 135 (H) 70 - 99 mg/dL     HEENT: normal Cardio: RRR and no murmur Resp: CTA B/L and unlabored GI: BS positive and NT, ND Extremity:  Pulses positive and trace LE Edema Skin:   Intact Neuro: Alert/Oriented. Improved trunk control Musc/Skel:  Swelling Left arm, with good thrill at AV graft site Gen NAD   Assessment/Plan: 1. Functional deficits secondary to Critical illness myopathy which require 3+ hours per day of interdisciplinary therapy in a comprehensive inpatient rehab setting. Physiatrist is providing close team supervision and 24 hour management of active medical problems listed below. Physiatrist and rehab team continue to assess barriers to discharge/monitor patient progress toward functional and medical goals.  FIM: FIM - Bathing Bathing Steps Patient Completed: Chest, Right Arm, Left Arm, Abdomen, Right upper leg, Left upper leg, Front perineal area Bathing: 3: Mod-Patient completes 5-7 72f10 parts or 50-74%  FIM - Upper Body Dressing/Undressing Upper body dressing/undressing steps patient completed: Thread/unthread right sleeve of pullover shirt/dresss, Put head through opening of pull over shirt/dress, Thread/unthread left sleeve of pullover shirt/dress, Pull shirt over trunk Upper body dressing/undressing: 5: Set-up assist to: Obtain clothing/put away FIM - Lower Body Dressing/Undressing Lower body dressing/undressing: 1: Total-Patient  completed  less than 25% of tasks  FIM - Toileting Toileting steps completed by patient: Performs perineal hygiene Toileting: 0: Activity did not occur  FIM - Radio producer Devices: Bedside commode, Sliding board Toilet Transfers: 2-To toilet/BSC: Max A (lift and lower assist)  FIM - Control and instrumentation engineer Devices: Sliding board, Arm rests Bed/Chair Transfer: 0: Activity did not occur  FIM - Locomotion: Wheelchair Distance: 75 Locomotion: Wheelchair: 1: Travels less than 50 ft with supervision, cueing or coaxing FIM - Locomotion: Ambulation Ambulation/Gait Assistance: Not tested (comment) Locomotion: Ambulation: 0: Activity did not occur  Comprehension Comprehension Mode: Auditory Comprehension: 5-Follows basic conversation/direction: With extra time/assistive device  Expression Expression Mode: Verbal Expression: 5-Expresses basic needs/ideas: With no assist  Social Interaction Social Interaction: 4-Interacts appropriately 75 - 89% of the time - Needs redirection for appropriate language or to initiate interaction.  Problem Solving Problem Solving: 4-Solves basic 75 - 89% of the time/requires cueing 10 - 24% of the time  Memory Memory: 4-Recognizes or recalls 75 - 89% of the time/requires cueing 10 - 24% of the time  Medical Problem List and Plan: 1. Functional deficits secondary to Critical illness myopathy.   2.  DVT Prophylaxis/Anticoagulation: Pharmaceutical: Lovenox, pt refusing drop in Hgb will check stool guaic and hold, use SCDs 3. Pain Management: Prn oxycodone.   4. Mood: LCSW to follow for evaluation and support. Team to provide egosupport as well.   5. Neuropsych: This patient is capable of making decisions on her own behalf. 6. Skin/Wound Care: continue foam dressing to sacrum. Order Prevalon boot for right foot as well as elevation when in bed.   7. Fluids/Electrolytes/Nutrition: Strict I/O. 1200 FR due  to ESRD. 8. ESRD: HD after therapy day. Nephrology to manage dialysis,               -AVG site tender but intact 9. CAD: metoprolol, ASA, monitor clinically with functional activities 10. A fib: rate controlled at present. Continue metoprolol   12. DM type II: sliding scale insulin. Check cbg's ac and hs             -on amaryl at home, will resume since CBGs above 200 13.  Heme + stool, microscopic, monitor for frank red blood, likely small anal fissure or hemorrhoid plus lovenox, will monitor, transfused in HD  -serial H/H LOS (Days) 7 A FACE TO FACE EVALUATION WAS PERFORMED  KIRSTEINS,ANDREW E 08/05/2014, 7:24 AM

## 2014-08-05 NOTE — Progress Notes (Signed)
Hemodialysis completed without issue, given 1 unit prbcs with HD. BP 146/70 at rinseback. Total 3L UF

## 2014-08-05 NOTE — Progress Notes (Signed)
Physical Therapy Session Note  Patient Details  Name: Briana Gould MRN: [id number] Date of Birth: Feb 13, 1946  Today's Date: 08/05/2014 PT Individual Time: 13:00-14:00 (60min)    Short Term Goals: Week 1:  PT Short Term Goal 1 (Week 1): Pt will demonstrate supine to sit transfer with SBA PT Short Term Goal 2 (Week 1): Pt will scoot transfer with SB req mod A.  PT Short Term Goal 3 (Week 1): Pt will self propel manual w/c x 100' req SBA.  PT Short Term Goal 4 (Week 1): Pt will initiate ambulate with PT during session.  PT Short Term Goal 5 (Week 1): Pt will demonstrate sit to supine transfer with SBA.   Skilled Therapeutic Interventions/Progress Updates:  Tx focused on functional mobility training, WC propulsion, sliding board transfers, lateral scooting, and therex in supine for strength and ROM. Pt up EOB, ready for therapy with no complaints. Pt states her RLE is still too swollen to fit prosthesis and is not willing to try. She has her shrinker on all day, per pt, but it became too uncomfortable. PT wrapped with ACE for compression and performed therex to increase circulation.   Therapeutic activity Instructed pt in sliding board transfers x4 throughout. PT demonstrated efficient technique including head-hips relationship and anterior translation over BOS. Pt provided manual facilitation for trunk mobility to have pt practice forward leans with momentum. Pt unable to generate any momentum and coordinate trunk/hip movement without facilitation. Pt able to perform downhill transfers with sliding board and Mod A for hip advancement after full set-up. Pt continues to require Max A for level or uphill transfers, but is increasing initiation today.   Pt propelled WC x60' with S and cues for technique and encouragement. Increased time and 3 rest breaks required. Pt encouraged to increase independence with WC parts management, provided instruction and extra time for set-up.   Performed lateral  scoots along side of mat to increase mobility techniques above. Again, pt needing increased time and encouragement to continue attempts. Pt unable to fully clear hips, needing Mod A.   Instructed pt in BKA therex with handout for increased strength and ROM in supine. Pt needed Min A for sit>supine and Mod A supine>sit for trunk lifting. Pt needed cues and encouragement for therex.  Pthad episode of bowel incontinence, so was assisted back to bed with nursing to cleaning. Handoff to RN.        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

## 2014-08-05 NOTE — Progress Notes (Signed)
[facility] Progress Note  Assessment/Plan: 1. Critical illness myopathy - rehab since 11/20. Progressing. 2. A-fib - not a candidate for anticoagulation, HR controlled on Metoprolol  3. ESRD - HD on MWF @ [facility]. Keep BFR 350, 3 liters off max to minimize cardiac stress as much as possible. Midodrine. She is now about 8 kg above her outpt EDW - came out of [facility] very overloaded (10 kg up). We are slowly taking her weight back down as tolerated 4. BP/volume - BP support with midodrine; 8 kgs over previous outpt edw- sig RLE edema, but cramping s/p net UF only 1.8 L last TMT 5. Anemia - Hgb up to 8.8 stable after a unit of PRBC's with HD 11/24 - Aranesp increased to 150 mcg 11/24 and received dose in HD. Low threshold for additional transfusion since such sig CAD, TSat 29% with Fe of 57 on 11/22. 6. Metabolic bone disease - Ca 9.6 (10.4 corrected), P 2.8, PTH 769 (11/4) cont Hectorol, reduced Renvela to 1 with meals. 7. Nutrition - Alb 3.2, Nepro, renal diet.  8. DM - per primary, A1c 6.5 (11/20) 9. Left AVF hematoma - shuntogram 11/4 patent AV access; 2 hematomas still present on Duplex from 11/14. On exam hematomas are rock hard and would preclude cannulation of the access although bruit auscultated. [facility] - ask [facility] to see next week to assess when we might be able to use AVF 10. Constipation - laxatives- has sorbitol ordered -  11. Cardiac Arrest- 3rd episode 10/19. Medical management for severe un-revascularizable multivessel CAD; Full Code 12. Heme + stool - 1 of 2 + on 11/23 (when Hb drop to 8.1). No frank bleeding. Monitor. Consider additional PRBC's if still in the 8's next HD (due to severe CAD) - if less than 9 today will give a unit. 13. New right upper arm rash itches - no pain- primary aware - watch  Sheffield SliderMartha B Bergman, PA-C [facility] Beeper [phone] 08/05/2014,9:29 AM  LOS: 7 days    I have seen and examined this patient and  agree with plan and assessment in above note by [facility] with highlighted additions If Hb less than 9 today will give a unit of blood. Will ask VVS to evaluate her permanent access next week as above.Camille Bal. Deloria Brassfield B,MD 08/05/2014 11:13 AM   Subjective:   Tired after activities this am. Althia FortsSaid Rehab MD noted new rash on right upper arm that now itches.  Straining with BMs and not having good results. Notes hard spot on LL abdomen. No SOB  When I went in to examine her I woke her up and she said "I already told Sharl MaMarty all my complaints"  Objective Filed Vitals:   08/04/14 0736 08/04/14 1500 08/04/14 2047 08/05/14 0723  BP:  122/60  147/69  Pulse:  65  72  Temp:  98.7 F (37.1 C)  98 F (36.7 C)  TempSrc:  Oral  Oral  Resp:  20  18  Weight:      SpO2: 99% 100% 99% 98%   Physical Exam General: supine in bed Heart: RRR Lungs: no rales, rhonchi Abdomen: soft NT firm discrete lump in left lower abdomen; skin very lax Extremities: right upper arm patchy red rash; left BKA right LE edema + support stockings Dialysis Access: left IJ and left upper AVF patent with firm underarm hematoma  Dialysis Orders: MWF [facility] (from prior to 06/27/14 admission post cardiac arrest)  75kgs 2K/2Ca 4hours 5000 Heparin Left IJ  L AVF 350/1.5 Limit UF to 3 liters/treatment hectorol 2 Aranesp 25this has been increased to 150 with adm back to rehab unit Venofer 50 q week Profile 4   Additional Objective Labs: Basic Metabolic Panel:  Recent Labs Lab 07/31/14 1609 08/02/14 1518  NA 133* 137  K 4.6 4.4  CL 91* 94*  CO2 24 25  GLUCOSE 226* 81  BUN 58* 52*  CREATININE 5.44* 5.09*  CALCIUM 9.2 9.6  PHOS 2.9 2.8   Liver Function Tests:  Recent Labs Lab 07/31/14 1609 08/02/14 1518  ALBUMIN 3.1* 3.2*   CBC:  Recent Labs Lab 07/31/14 1609 08/02/14 1518 08/03/14 0608 08/04/14 0544  WBC 9.9 9.6 9.8 9.8  HGB 8.1* 8.3* 8.6* 8.8*  HCT 25.7* 26.0* 27.5* 27.9*   MCV 98.1 100.8* 97.5 98.6  PLT 208 235 229 232  CBG:  Recent Labs Lab 08/04/14 0650 08/04/14 1126 08/04/14 1707 08/04/14 2052 08/05/14 0657  GLUCAP 93 180* 152* 211* 135*   Medications:   . sodium chloride   Intravenous Once  . aspirin EC  81 mg Oral Daily  . darbepoetin (ARANESP) injection - DIALYSIS  150 mcg Intravenous Q Wed-HD  . docusate sodium  100 mg Oral BID  . doxercalciferol  5 mcg Intravenous Q M,W,F-HD  . feeding supplement (NEPRO CARB STEADY)  237 mL Oral TID WC  . [START ON 08/10/2014] ferric gluconate (FERRLECIT/NULECIT) IV  62.5 mg Intravenous Q Wed-HD  . fluticasone  2 puff Inhalation BID  . glimepiride  2 mg Oral Q breakfast  . guaiFENesin  600 mg Oral BID  . hydrocerin   Topical BID  . insulin aspart  0-15 Units Subcutaneous TID WC  . metoCLOPramide  10 mg Oral TID AC & HS  . metoprolol tartrate  12.5 mg Oral 4 times per day on Sun Tue Thu Sat  . midodrine  5 mg Oral Q M,W,F-HD  . multivitamin  1 tablet Oral QHS  . pantoprazole  40 mg Oral Daily  . polyethylene glycol  17 g Oral Daily  . prednisoLONE acetate  1 drop Both Eyes TID AC  . sevelamer carbonate  800 mg Oral TID WC

## 2014-08-05 NOTE — Progress Notes (Signed)
Occupational Therapy Session Note  Patient Details  Name: Briana Gould MRN: [id number] Date of Birth: December 13, 1945  Today's Date: 08/05/2014 OT Individual Time: [id number] OT Individual Time Calculation (min): 30 min    Short Term Goals: Week 2:  OT Short Term Goal 1 (Week 2): Pt will complete slide [facility] with mod A x 1. OT Short Term Goal 2 (Week 2): Pt will be able to don pants over both legs from supine position. OT Short Term Goal 3 (Week 2): Pt will be able to don underwear and/or pants over hips with min A by rolling in bed. OT Short Term Goal 4 (Week 2): Pt will be able to lateral lean to both sides on BSC to be able to perform hygiene.  Skilled Therapeutic Interventions/Progress Updates:  Upon entering the room, pt seated on EOB with no c/o pain. Pt reporting, "I am so tired. They have been giving me so much therapy." Pt requiring coaxing for participating this session. Pt refusing to engage in lateral leans but agreed to perform rolls to L and R with verbal cues to pull depends up B hips. Pt requiring frequent rest breaks secondary to fatigue from 10+ rolls onto each side. Pt requiring Min A to adjust clothing. Pt on room O2 with stats remaining between 95-98% during session. OT encouraged pt to engage in pursed lip breathing as pt observed to be holding breath when concentrating on tasks. Pt remained seated on EOB with bed alarm on awaiting dialysis.   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

## 2014-08-06 ENCOUNTER — Inpatient Hospital Stay (HOSPITAL_COMMUNITY): Payer: Medicare Other | Admitting: *Deleted

## 2014-08-06 ENCOUNTER — Inpatient Hospital Stay (HOSPITAL_COMMUNITY): Payer: Medicare Other | Admitting: Speech Pathology

## 2014-08-06 LAB — TYPE AND SCREEN
ABO/RH(D): O POS
Antibody Screen: NEGATIVE
UNIT DIVISION: 0
Unit division: 0

## 2014-08-06 LAB — CBC
HCT: 30.8 % — ABNORMAL LOW (ref 36.0–46.0)
Hemoglobin: 9.9 g/dL — ABNORMAL LOW (ref 12.0–15.0)
MCH: 31.5 pg (ref 26.0–34.0)
MCHC: 32.1 g/dL (ref 30.0–36.0)
MCV: 98.1 fL (ref 78.0–100.0)
PLATELETS: 215 10*3/uL (ref 150–400)
RBC: 3.14 MIL/uL — AB (ref 3.87–5.11)
RDW: 20.8 % — AB (ref 11.5–15.5)
WBC: 10.7 10*3/uL — ABNORMAL HIGH (ref 4.0–10.5)

## 2014-08-06 LAB — GLUCOSE, CAPILLARY
Glucose-Capillary: 165 mg/dL — ABNORMAL HIGH (ref 70–99)
Glucose-Capillary: 122 mg/dL — ABNORMAL HIGH (ref 70–99)
Glucose-Capillary: 208 mg/dL — ABNORMAL HIGH (ref 70–99)
Glucose-Capillary: 172 mg/dL — ABNORMAL HIGH (ref 70–99)

## 2014-08-06 NOTE — Progress Notes (Signed)
Physical Therapy Session Note  Patient Details  Name: Briana Gould MRN: [id number] Date of Birth: 1945-10-04  Today's Date: 08/06/2014 PT Individual Time:  14:00-15:00 (60min)    Short Term Goals: Week 1:  PT Short Term Goal 1 (Week 1): Pt will demonstrate supine to sit transfer with SBA PT Short Term Goal 2 (Week 1): Pt will scoot transfer with SB req mod A.  PT Short Term Goal 3 (Week 1): Pt will self propel manual w/c x 100' req SBA.  PT Short Term Goal 4 (Week 1): Pt will initiate ambulate with PT during session.  PT Short Term Goal 5 (Week 1): Pt will demonstrate sit to supine transfer with SBA.   Skilled Therapeutic Interventions/Progress Updates:  Tx focused on functional mobility training, WC propulsion, and activity tolerance as well as standing frame. Pt able to don prosthesis with assist today.   [facility] Assisted with donning shrinker and attempted prosthesis, but pt unable to get enough weight forward onto it.    Pt able to propel WC 2x40' with S and max encouragement to continue. Efficiency is improving, but activity tolerance is limited. Pt continues to need assist with all WC parts management and forward/backward scooting, but is improving initiation.   Pt engaged in standing frame x3 which allowed enough weight-bearing for prosthesis to engage. Pt able to tolerate standing up to 2min with max encouragement to continue and seated rest breaks between.     Pt assisted with slideboard transfer with Max A, but pt contribution more today. She continues to need assist with sliding board placement and set-up. Sit>supine with Min A. All needs in reach.       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

## 2014-08-06 NOTE — Progress Notes (Signed)
Speech Language Pathology Daily Session Note  Patient Details  Name: Briana Gould MRN: [id number] Date of Birth: [birth year]  Today's Date: 08/06/2014 SLP Individual Time: 1330-1400 SLP Individual Time Calculation (min): 30 min  Short Term Goals: Week 2: SLP Short Term Goal 1 (Week 2): Pt will tolerate her currently prescribed diet with no overt s/s of aspiration and mod I use of swallowing precautions.  SLP Short Term Goal 2 (Week 2): Patient will utilize external memory aids to recall new, daily information with supervision multimodal cues.  SLP Short Term Goal 3 (Week 2): Patient will demonstrate functional problem solving for mildly complex tasks with supervision multimodal cues.  SLP Short Term Goal 4 (Week 2): Patient will self-monitor and correct errors during functional tasks with supervision question cues.   Skilled Therapeutic Interventions: Skilled treatment session focused on addressing cognition goals.  SLP facilitated session with set-up of a complex problem solving task and Mod verbal cues to self-monitor errors during completion the money management task.  Patient demonstrated some emergent awareness of difficulty with task and reported to SLP that her daughter has been managing finances and can help her after discharge if needed.  Continue with current plan of care.    FIM:  Comprehension Comprehension Mode: Auditory Comprehension: 5-Follows basic conversation/direction: With extra time/assistive device Expression Expression Mode: Verbal Expression: 5-Expresses basic needs/ideas: With extra time/assistive device Social Interaction Social Interaction: 5-Interacts appropriately 90% of the time - Needs monitoring or encouragement for participation or interaction. Problem Solving Problem Solving: 5-Solves basic problems: With no assist Memory Memory: 4-Recognizes or recalls 75 - 89% of the time/requires cueing 10 - 24% of the time  Pain Pain Assessment Pain Assessment:  No/denies pain  Therapy/Group: Individual Therapy  Briana FerrettiMelissa Jamiyla Gould, M.A., CCC-SLP [phone]  Briana Gould 08/06/2014, 4:00 PM

## 2014-08-06 NOTE — Progress Notes (Signed)
Subjective/Complaints:  Overall doing well. Had HD yesterday. No complaints. Pain better Review of Systems - otherwise neg Objective: Vital Signs: Blood pressure 136/53, pulse 73, temperature 98.7 F (37.1 C), temperature source Oral, resp. rate 17, weight 82 kg (180 lb 12.4 oz), SpO2 95 %. No results found. Results for orders placed or performed during the hospital encounter of 07/29/14 (from the past 72 hour(s))  Glucose, capillary     Status: Abnormal   Collection Time: 08/03/14 11:26 AM  Result Value Ref Range   Glucose-Capillary 189 (H) 70 - 99 mg/dL  Glucose, capillary     Status: Abnormal   Collection Time: 08/03/14  4:17 PM  Result Value Ref Range   Glucose-Capillary 191 (H) 70 - 99 mg/dL  Glucose, capillary     Status: Abnormal   Collection Time: 08/03/14  9:01 PM  Result Value Ref Range   Glucose-Capillary 165 (H) 70 - 99 mg/dL  CBC     Status: Abnormal   Collection Time: 08/04/14  5:44 AM  Result Value Ref Range   WBC 9.8 4.0 - 10.5 K/uL   RBC 2.83 (L) 3.87 - 5.11 MIL/uL   Hemoglobin 8.8 (L) 12.0 - 15.0 g/dL   HCT 27.9 (L) 36.0 - 46.0 %   MCV 98.6 78.0 - 100.0 fL   MCH 31.1 26.0 - 34.0 pg   MCHC 31.5 30.0 - 36.0 g/dL   RDW 20.0 (H) 11.5 - 15.5 %   Platelets 232 150 - 400 K/uL  Glucose, capillary     Status: None   Collection Time: 08/04/14  6:50 AM  Result Value Ref Range   Glucose-Capillary 93 70 - 99 mg/dL  Glucose, capillary     Status: Abnormal   Collection Time: 08/04/14 11:26 AM  Result Value Ref Range   Glucose-Capillary 180 (H) 70 - 99 mg/dL  Glucose, capillary     Status: Abnormal   Collection Time: 08/04/14  5:07 PM  Result Value Ref Range   Glucose-Capillary 152 (H) 70 - 99 mg/dL  Glucose, capillary     Status: Abnormal   Collection Time: 08/04/14  8:52 PM  Result Value Ref Range   Glucose-Capillary 211 (H) 70 - 99 mg/dL  Glucose, capillary     Status: Abnormal   Collection Time: 08/05/14  6:57 AM  Result Value Ref Range   Glucose-Capillary 135 (H) 70 - 99 mg/dL  Glucose, capillary     Status: Abnormal   Collection Time: 08/05/14 11:24 AM  Result Value Ref Range   Glucose-Capillary 197 (H) 70 - 99 mg/dL  CBC     Status: Abnormal   Collection Time: 08/05/14  3:44 PM  Result Value Ref Range   WBC 10.7 (H) 4.0 - 10.5 K/uL   RBC 2.87 (L) 3.87 - 5.11 MIL/uL   Hemoglobin 9.1 (L) 12.0 - 15.0 g/dL   HCT 28.6 (L) 36.0 - 46.0 %   MCV 99.7 78.0 - 100.0 fL   MCH 31.7 26.0 - 34.0 pg   MCHC 31.8 30.0 - 36.0 g/dL   RDW 19.7 (H) 11.5 - 15.5 %   Platelets 250 150 - 400 K/uL  Renal function panel     Status: Abnormal   Collection Time: 08/05/14  3:44 PM  Result Value Ref Range   Sodium 136 (L) 137 - 147 mEq/L   Potassium 4.9 3.7 - 5.3 mEq/L   Chloride 93 (L) 96 - 112 mEq/L   CO2 23 19 - 32 mEq/L   Glucose, Bld 176 (  H) 70 - 99 mg/dL   BUN 72 (H) 6 - 23 mg/dL   Creatinine, Ser 6.10 (H) 0.50 - 1.10 mg/dL   Calcium 9.4 8.4 - 10.5 mg/dL   Phosphorus 2.9 2.3 - 4.6 mg/dL   Albumin 3.4 (L) 3.5 - 5.2 g/dL   GFR calc non Af Amer 6 (L) >90 mL/min   GFR calc Af Amer 7 (L) >90 mL/min    Comment: (NOTE) The eGFR has been calculated using the CKD EPI equation. This calculation has not been validated in all clinical situations. eGFR's persistently <90 mL/min signify possible Chronic Kidney Disease.    Anion gap 20 (H) 5 - 15  Prepare RBC     Status: None   Collection Time: 08/05/14  4:51 PM  Result Value Ref Range   Order Confirmation ORDER PROCESSED BY BLOOD BANK   Glucose, capillary     Status: Abnormal   Collection Time: 08/05/14  8:56 PM  Result Value Ref Range   Glucose-Capillary 114 (H) 70 - 99 mg/dL   Comment 1 Notify RN   CBC     Status: Abnormal   Collection Time: 08/06/14  4:54 AM  Result Value Ref Range   WBC 10.7 (H) 4.0 - 10.5 K/uL   RBC 3.14 (L) 3.87 - 5.11 MIL/uL   Hemoglobin 9.9 (L) 12.0 - 15.0 g/dL   HCT 30.8 (L) 36.0 - 46.0 %   MCV 98.1 78.0 - 100.0 fL   MCH 31.5 26.0 - 34.0 pg   MCHC 32.1 30.0  - 36.0 g/dL   RDW 20.8 (H) 11.5 - 15.5 %   Platelets 215 150 - 400 K/uL  Glucose, capillary     Status: Abnormal   Collection Time: 08/06/14  6:53 AM  Result Value Ref Range   Glucose-Capillary 165 (H) 70 - 99 mg/dL   Comment 1 Notify RN      HEENT: normal Cardio: RRR and no murmur Resp: CTA B/L and unlabored GI: BS positive and NT, ND Extremity:  Pulses positive and trace LE Edema Skin:   Intact Neuro: Alert/Oriented. Improved trunk control Musc/Skel:  Swelling Left arm, with good thrill at AV graft site Gen NAD   Assessment/Plan: 1. Functional deficits secondary to Critical illness myopathy which require 3+ hours per day of interdisciplinary therapy in a comprehensive inpatient rehab setting. Physiatrist is providing close team supervision and 24 hour management of active medical problems listed below. Physiatrist and rehab team continue to assess barriers to discharge/monitor patient progress toward functional and medical goals.  FIM: FIM - Bathing Bathing Steps Patient Completed: Chest, Right Arm, Left Arm, Abdomen, Right upper leg, Left upper leg, Front perineal area Bathing: 3: Mod-Patient completes 5-7 [id number] parts or 50-74%  FIM - Upper Body Dressing/Undressing Upper body dressing/undressing steps patient completed: Thread/unthread right sleeve of pullover shirt/dresss, Put head through opening of pull over shirt/dress, Thread/unthread left sleeve of pullover shirt/dress, Pull shirt over trunk Upper body dressing/undressing: 5: Set-up assist to: Obtain clothing/put away FIM - Lower Body Dressing/Undressing Lower body dressing/undressing steps patient completed: Thread/unthread left underwear leg Lower body dressing/undressing: 2: Max-Patient completed 25-49% of tasks (performed supine in bed)  FIM - Toileting Toileting steps completed by patient: Performs perineal hygiene Toileting: 0: Activity did not occur  FIM - TRadio producerDevices:  Bedside commode, Sliding board Toilet Transfers: 2-To toilet/BSC: Max A (lift and lower assist)  FIM - BControl and instrumentation engineerDevices: Sliding board, Arm rests Bed/Chair Transfer: 3: Supine >  Sit: Mod A (lifting assist/Pt. 50-74%/lift 2 legs, 4: Sit > Supine: Min A (steadying pt. > 75%/lift 1 leg), 3: Bed > Chair or W/C: Mod A (lift or lower assist), 2: Chair or W/C > Bed: Max A (lift and lower assist)  FIM - Locomotion: Wheelchair Distance: 60 Locomotion: Wheelchair: 2: Travels 50 - 149 ft with supervision, cueing or coaxing FIM - Locomotion: Ambulation Ambulation/Gait Assistance: Not tested (comment) Locomotion: Ambulation: 0: Activity did not occur  Comprehension Comprehension Mode: Auditory Comprehension: 5-Follows basic conversation/direction: With extra time/assistive device  Expression Expression Mode: Verbal Expression: 5-Expresses basic needs/ideas: With extra time/assistive device  Social Interaction Social Interaction: 4-Interacts appropriately 75 - 89% of the time - Needs redirection for appropriate language or to initiate interaction.  Problem Solving Problem Solving: 4-Solves basic 75 - 89% of the time/requires cueing 10 - 24% of the time  Memory Memory: 4-Recognizes or recalls 75 - 89% of the time/requires cueing 10 - 24% of the time  Medical Problem List and Plan: 1. Functional deficits secondary to Critical illness myopathy.   2.  DVT Prophylaxis/Anticoagulation: Pharmaceutical: Lovenox, pt refusing drop in Hgb will check stool guaic and hold, use SCDs 3. Pain Management: Prn oxycodone.   4. Mood: LCSW to follow for evaluation and support. Team to provide egosupport as well.   5. Neuropsych: This patient is capable of making decisions on her own behalf. 6. Skin/Wound Care: continue foam dressing to sacrum. Order Prevalon boot for right foot as well as elevation when in bed.   7. Fluids/Electrolytes/Nutrition: Strict I/O. 1200 FR due to  ESRD. 8. ESRD: HD after therapy day. Nephrology to manage dialysis,               -AVG site less tender  9. CAD: metoprolol, ASA, monitor clinically with functional activities 10. A fib: rate controlled at present. Continue metoprolol   12. DM type II: sliding scale insulin. Check cbg's ac and hs             -on amaryl at home,  resumed for CBGs above 200---follow today 13.  Heme + stool, microscopic, monitor for frank red blood, likely small anal fissure or hemorrhoid plus lovenox, will monitor, transfused in HD  -serial H/H LOS (Days) 8 A FACE TO FACE EVALUATION WAS PERFORMED  Briana Gould T 08/06/2014, 8:06 AM

## 2014-08-06 NOTE — Progress Notes (Signed)
Nursing Note" Pt returned from HD via her bed and resting quietly w/o complaints.wbb

## 2014-08-06 NOTE — Progress Notes (Addendum)
Occupational Therapy Session Note  Patient Details  Name: Briana Gould MRN: [id number] Date of Birth: 08-24-1946  Today's Date: 08/06/2014 OT Individual Time:  -  0930-1030  (60 min)  1st sessionn   No pain, just fatigued; BP=143/62, HR=82, Oxy= 95%                                        1130-1210  (40 min)  2nd session; no pain;  HR= 62, Oxy=98%   Short Term Goals: Week 1:  OT Short Term Goal 1 (Week 1): Pt would be able to transfer to and from BSC/ toliet with mod A  OT Short Term Goal 1 - Progress (Week 1): Not met OT Short Term Goal 2 (Week 1): Pt would tolerate prosethesis on during transfer training OT Short Term Goal 2 - Progress (Week 1): Not met OT Short Term Goal 3 (Week 1): Pt would be able to perform toielting after BM with mod A  OT Short Term Goal 3 - Progress (Week 1): Not met OT Short Term Goal 4 (Week 1): Pt will perform UB dressing with setup  OT Short Term Goal 4 - Progress (Week 1): Met Week 2:  OT Short Term Goal 1 (Week 2): Pt will complete slide board transfer to [facility] with mod A x 1. OT Short Term Goal 2 (Week 2): Pt will be able to don pants over both legs from supine position. OT Short Term Goal 3 (Week 2): Pt will be able to don underwear and/or pants over hips with min A by rolling in bed. OT Short Term Goal 4 (Week 2): Pt will be able to lateral lean to both sides on BSC to be able to perform hygiene.  Skilled Therapeutic Interventions/Progress Updates:      1st session:  Pt. Lying in bed upon OT arrival.  She needed encouragement to participate in bathing and dressing.  She reported being very tired.  She Sat EOB with lateral lean to right.  She bathed UB and donned shirt and bra with min assist for hooks.  She required frequent rest breaks during this activity.  Oxygen saturation =  93 %.  Pt. Asked to be placed back on 2 liters of oxygen which was done.  Performed bridging in bed for better positioning, and RLE elevation for edema control.  Pt left lying in bed  with all needs in reach.    2nd session:  Pt. Sitting in wc.  She propelled wc to closet and retrieved dirty clothes.  Propelled wc about 10 feet before stopping and resting.  Assisted pt to laundry room.  Attempted sit to stand x3 to operate machine but pt unable to stand.  Pt wheeled back to room about 20 feet before stopping.  Once in room, pt propelled wc to sink and washed hands.  Assisted pt with opening packages for lunch.  Left pt in room with all items in reach.   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

## 2014-08-06 NOTE — Progress Notes (Signed)
[facility] Progress Note   Subjective:    I'm "whipped" Tired after HD yesterday and a little therapy today Says slept well last night. Denies BP or SOB Allowed the 3 liter UF with HD Received an additional unit of blood with HD yesterday Weight down to 82 kg (EDW 75)  Objective Filed Vitals:   08/05/14 2015 08/05/14 2040 08/06/14 0618 08/06/14 0717  BP: 146/70 132/67 136/53   Pulse: 73 73 73   Temp: 97.9 F (36.6 C) 98.2 F (36.8 C) 98.7 F (37.1 C)   TempSrc: Oral Oral Oral   Resp: 20 20 17    Weight: 82 kg (180 lb 12.4 oz)  82 kg (180 lb 12.4 oz)   SpO2:  100% 100% 95%   Physical Exam General: Lying in bed. Wearing O2. NAD Heart:  S1S2 No S3 1/6 systolic murmur LSB Lungs: no rales, rhonchi or other adventitious sounds Abdomen: soft NT firm discrete lump in left lower abdomen; skin very lax Extremities: right upper arm patchy red rash Left BKA right LE edema 2+ Dialysis Access: left IJ and left upper AVF patent with firm underarm hematoma unchanged Additional Objective Labs: Basic Metabolic Panel:  Recent Labs Lab 07/31/14 1609 08/02/14 1518 08/05/14 1544  NA 133* 137 136*  K 4.6 4.4 4.9  CL 91* 94* 93*  CO2 24 25 23   GLUCOSE 226* 81 176*  BUN 58* 52* 72*  CREATININE 5.44* 5.09* 6.10*  CALCIUM 9.2 9.6 9.4  PHOS 2.9 2.8 2.9   Liver Function Tests:  Recent Labs Lab 07/31/14 1609 08/02/14 1518 08/05/14 1544  ALBUMIN 3.1* 3.2* 3.4*   CBC:  Recent Labs Lab 08/02/14 1518 08/03/14 0608 08/04/14 0544 08/05/14 1544 08/06/14 0454  WBC 9.6 9.8 9.8 10.7* 10.7*  HGB 8.3* 8.6* 8.8* 9.1* 9.9*  HCT 26.0* 27.5* 27.9* 28.6* 30.8*  MCV 100.8* 97.5 98.6 99.7 98.1  PLT 235 229 232 250 215  CBG:  Recent Labs Lab 08/04/14 2052 08/05/14 0657 08/05/14 1124 08/05/14 2056 08/06/14 0653  GLUCAP 211* 135* 197* 114* 165*   Medications:   . sodium chloride   Intravenous Once  . sodium chloride   Intravenous Once  . aspirin EC  81 mg Oral  Daily  . darbepoetin (ARANESP) injection - DIALYSIS  150 mcg Intravenous Q Wed-HD  . docusate sodium  100 mg Oral BID  . doxercalciferol  5 mcg Intravenous Q M,W,F-HD  . feeding supplement (NEPRO CARB STEADY)  237 mL Oral TID WC  . [START ON 08/10/2014] ferric gluconate (FERRLECIT/NULECIT) IV  62.5 mg Intravenous Q Wed-HD  . fluticasone  2 puff Inhalation BID  . glimepiride  2 mg Oral Q breakfast  . guaiFENesin  600 mg Oral BID  . hydrocerin   Topical BID  . insulin aspart  0-15 Units Subcutaneous TID WC  . metoCLOPramide  10 mg Oral TID AC & HS  . metoprolol tartrate  12.5 mg Oral 4 times per day on Sun Tue Thu Sat  . midodrine  5 mg Oral Q M,W,F-HD  . multivitamin  1 tablet Oral QHS  . pantoprazole  40 mg Oral Daily  . polyethylene glycol  17 g Oral Daily  . prednisoLONE acetate  1 drop Both Eyes TID AC  . sevelamer carbonate  800 mg Oral TID WC     Dialysis Orders: MWF [facility] (from prior to 06/27/14 admission post cardiac arrest)  75kgs 2K/2Ca 4hours 5000 Heparin Left IJ L AVF 350/1.5 Limit UF to 3 liters/treatment hectorol  2 Aranesp 25this has been increased to 150 with adm back to rehab unit Venofer 50 q week Profile 4    Assessment/Plan: 1. Critical illness myopathy - rehab since 11/20. Progressing. 2. A-fib - not a candidate for anticoagulation, HR controlled on Metoprolol  3. ESRD - HD on MWF @ [facility]. Keep BFR 350, 3 liters off max to minimize cardiac stress as much as possible. Midodrine. She is now about 7 kg above her outpt EDW - came out of [facility] very overloaded (10 kg up). We are slowly taking her weight back down as tolerated (she does NOT want any extra treatments) 4. BP/volume - BP support with midodrine; 7 kgs over previous outpt edw- still sig RLE edema 5. Anemia - s/p PRBC's with HD 11/24 and 11/27 with Hb up to 9.9. Aranesp at 150. TSat 29% on 11/22. 6. Metabolic bone disease -  PTH 769 (11/4) cont Hectorol, reduced Renvela to  1 with meals d/t phos sl lower than 3. 7. Nutrition - Alb 3.4, Nepro, renal diet.  8. DM - per primary, A1c 6.5 (11/20) 9. Left AVF hematoma - shuntogram 11/4 patent AV access; 2 hematomas still present on Duplex from 11/14. On exam hematomas are rock hard and would preclude cannulation of the access although bruit auscultated. [facility] ask [facility] to see next week to assess when we might be able to use AVF 10. Constipation - laxatives- has sorbitol ordered -  11. Cardiac Arrest- 3rd episode 10/19. Medical management for severe un-revascularizable multivessel CAD; Full Code 12. Heme + stool - 1 of 2 + on 11/23 (when Hb drop to 8.1). No frank bleeding. Monitor. S/p 2 u PRBC's  13. New right upper arm rash itches - no pain- primary aware - watch  Camille Balynthia Mckaylie Vasey, MD [facility] [phone] Pager 08/06/2014, 10:54 AM

## 2014-08-06 NOTE — Progress Notes (Signed)
Nursing Note: Pt called for nurse assistance. "Unable to get comfortable".'I have just never felt so weak"the patient frustrated and tearful.Support offered and encouragement.Tylenol 325 given per pt request and after repositioned resting quietly in bed with tissues in her hand.wbb    t

## 2014-08-07 ENCOUNTER — Inpatient Hospital Stay (HOSPITAL_COMMUNITY): Payer: BC Managed Care – PPO

## 2014-08-07 LAB — GLUCOSE, CAPILLARY
GLUCOSE-CAPILLARY: 154 mg/dL — AB (ref 70–99)
GLUCOSE-CAPILLARY: 132 mg/dL — AB (ref 70–99)
Glucose-Capillary: 79 mg/dL (ref 70–99)
Glucose-Capillary: 172 mg/dL — ABNORMAL HIGH (ref 70–99)

## 2014-08-07 NOTE — Plan of Care (Signed)
Problem: RH SAFETY Goal: RH STG ADHERE TO SAFETY PRECAUTIONS W/ASSISTANCE/DEVICE STG Adhere to Safety Precautions With min Assistance/Device.  Outcome: Progressing Goal: RH STG DECREASED RISK OF FALL WITH ASSISTANCE STG Decreased Risk of Fall With min Assistance.  Outcome: Progressing     

## 2014-08-07 NOTE — Plan of Care (Signed)
Problem: RH BOWEL ELIMINATION Goal: RH STG MANAGE BOWEL WITH ASSISTANCE STG Manage Bowel with min Assistance.  Outcome: Progressing     

## 2014-08-07 NOTE — Progress Notes (Signed)
Physical Therapy Session Note  Patient Details  Name: Briana Gould Seltzer MRN: [id number] Date of Birth: 01/25/1946  Today's Date: 08/07/2014 PT Individual Time: 1715-1745 PT Individual Time Calculation (min): 30 min   Short Term Goals: Week 1:  PT Short Term Goal 1 (Week 1): Pt will demonstrate supine to sit transfer with SBA PT Short Term Goal 2 (Week 1): Pt will scoot transfer with SB req mod A.  PT Short Term Goal 3 (Week 1): Pt will self propel manual w/c x 100' req SBA.  PT Short Term Goal 4 (Week 1): Pt will initiate ambulate with PT during session.  PT Short Term Goal 5 (Week 1): Pt will demonstrate sit to supine transfer with SBA.   Skilled Therapeutic Interventions/Progress Updates:    Pt continues to demonstrate difficulty carrying over cues for weight shift, leaning in opposite direction. Pt does best scooting using bobath transfer technique.Pt attempted at 1655 but pt elects to defer session to later in evening. Pt would continue to benefit from skilled PT services to increase functional mobility.  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   Other Treatments:  Pt performs transfer and scooting, partial sit to stands 2x5 in session. Pt performs anterior weight shifts, marching, LAQs, seated knee flexion, hip abd/add isometrics 2x10. Pt educated on rehab plan. Static sitting during rest breaks EOB. Patellar mobs and HS release performed for knee ext and knee flexion ROM.  See FIM for current functional status  Therapy/Group: Individual Therapy  [name], Shuntae Herzig G 08/07/2014, 6:56 PM

## 2014-08-07 NOTE — Progress Notes (Signed)
[facility] Progress Note   Subjective:    Didn't sleep well last night Had lots of questions about her meds - says she doesn't think she needs flonase or mucinex. Wonders why on steroid eye drops (I have no idea) Received an additional unit of blood with HD 11/27 and we got weight down to 82 kg (EDW 75) - back up to 83 today  Objective Filed Vitals:   08/06/14 1415 08/06/14 2114 08/07/14 0433 08/07/14 0500  BP: 137/64  145/63   Pulse: 73  72   Temp: 98.5 F (36.9 C)  97.6 F (36.4 C)   TempSrc: Oral  Oral   Resp: 20  18   Weight:    83 kg (182 lb 15.7 oz)  SpO2: 100% 97% 95%    Physical Exam General: Eating breakfast Heart:  S1S2 No S3 1/6 systolic murmur LSB Lungs: no rales, rhonchi or other adventitious sounds Abdomen: soft NT firm discrete lump in left lower abdomen otherwise benign Extremities: Left BKA right LE edema 2+ Dialysis Access: left IJ and left upper AVF patent with firm underarm hematoma and also hematoma more lateral to AVF unchanged  Additional Objective No new labs today 11/29 Basic Metabolic Panel:  Recent Labs Lab 07/31/14 1609 08/02/14 1518 08/05/14 1544  NA 133* 137 136*  K 4.6 4.4 4.9  CL 91* 94* 93*  CO2 24 25 23   GLUCOSE 226* 81 176*  BUN 58* 52* 72*  CREATININE 5.44* 5.09* 6.10*  CALCIUM 9.2 9.6 9.4  PHOS 2.9 2.8 2.9   Liver Function Tests:  Recent Labs Lab 07/31/14 1609 08/02/14 1518 08/05/14 1544  ALBUMIN 3.1* 3.2* 3.4*   CBC:  Recent Labs Lab 08/02/14 1518 08/03/14 0608 08/04/14 0544 08/05/14 1544 08/06/14 0454  WBC 9.6 9.8 9.8 10.7* 10.7*  HGB 8.3* 8.6* 8.8* 9.1* 9.9*  HCT 26.0* 27.5* 27.9* 28.6* 30.8*  MCV 100.8* 97.5 98.6 99.7 98.1  PLT 235 229 232 250 215  CBG:  Recent Labs Lab 08/06/14 0653 08/06/14 1146 08/06/14 1643 08/06/14 2050 08/07/14 0742  GLUCAP 165* 122* 208* 172* 154*   Medications:   . sodium chloride   Intravenous Once  . sodium chloride   Intravenous Once  . aspirin  EC  81 mg Oral Daily  . darbepoetin (ARANESP) injection - DIALYSIS  150 mcg Intravenous Q Wed-HD  . docusate sodium  100 mg Oral BID  . doxercalciferol  5 mcg Intravenous Q M,W,F-HD  . feeding supplement (NEPRO CARB STEADY)  237 mL Oral TID WC  . [START ON 08/10/2014] ferric gluconate (FERRLECIT/NULECIT) IV  62.5 mg Intravenous Q Wed-HD  . fluticasone  2 puff Inhalation BID  . glimepiride  2 mg Oral Q breakfast  . guaiFENesin  600 mg Oral BID  . hydrocerin   Topical BID  . insulin aspart  0-15 Units Subcutaneous TID WC  . metoCLOPramide  10 mg Oral TID AC & HS  . metoprolol tartrate  12.5 mg Oral 4 times per day on Sun Tue Thu Sat  . midodrine  5 mg Oral Q M,W,F-HD  . multivitamin  1 tablet Oral QHS  . pantoprazole  40 mg Oral Daily  . polyethylene glycol  17 g Oral Daily  . prednisoLONE acetate  1 drop Both Eyes TID AC  . sevelamer carbonate  800 mg Oral TID WC     Dialysis Orders: MWF [facility] (from prior to 06/27/14 admission post cardiac arrest)  75kgs 2K/2Ca 4hours 5000 Heparin Left IJ L AVF  350/1.5 Limit UF to 3 liters/treatment hectorol 2 Aranesp 25this has been increased to 150 with adm back to rehab unit Venofer 50 q week Profile 4    Assessment/Plan: 1. Critical illness myopathy - rehab since 11/20. Progressing. 2. A-fib - not a candidate for anticoagulation, HR controlled on Metoprolol  3. ESRD - HD on MWF @ [facility]. Keep BFR 350, 3 liters off max to minimize cardiac stress as much as possible. Midodrine. Have only gotten her as low as 7 kg above her outpt EDW - came out of [facility] very overloaded (10 kg up). We are slowly taking her weight back down as tolerated (she does NOT want any extra treatments) 4. BP/volume - BP support with midodrine; lowest wt so far 7 kgs over previous outpt edw- still sig RLE edema 5. Anemia - s/p PRBC's with HD 11/24 and 11/27 with Hb up to 9.9. Aranesp at 150. TSat 29% on 11/22. 6. Metabolic bone disease -  PTH  161769 (11/4) cont Hectorol, reduced Renvela to 1 with meals d/t phos sl lower than 3. 7. Nutrition - Alb 3.4, Nepro, renal diet.  8. DM - per primary, A1c 6.5 (11/20) 9. Left AVF hematoma - shuntogram 11/4 patent AV access; 2 hematomas still present on Duplex from 11/14. On exam hematomas are rock hard and would preclude cannulation of the access although bruit auscultated. [facility] ask [facility] to see next week to assess when we might be able to use AVF 10. Constipation - laxatives- has sorbitol ordered -  11. Cardiac Arrest- 3rd episode 10/19. Medical management for severe un-revascularizable multivessel CAD; Full Code 12. Heme + stool - 1 of 2 + on 11/23 (when Hb drop to 8.1). No frank bleeding. Monitor. S/p 2 u PRBC's  13. New right upper arm rash itches - no pain- primary aware - watch  Camille Balynthia Brittne Kawasaki, MD [facility] [phone] Pager 08/07/2014, 8:50 AM

## 2014-08-07 NOTE — Progress Notes (Signed)
Subjective/Complaints:  Happy that she stood yesterday "45 seconds". Slept well. Denies pain. Still fatigues easily Review of Systems - otherwise neg Objective: Vital Signs: Blood pressure 145/63, pulse 72, temperature 97.6 F (36.4 C), temperature source Oral, resp. rate 18, weight 83 kg (182 lb 15.7 oz), SpO2 95 %. No results found. Results for orders placed or performed during the hospital encounter of 07/29/14 (from the past 72 hour(s))  Glucose, capillary     Status: Abnormal   Collection Time: 08/04/14 11:26 AM  Result Value Ref Range   Glucose-Capillary 180 (H) 70 - 99 mg/dL  Glucose, capillary     Status: Abnormal   Collection Time: 08/04/14  5:07 PM  Result Value Ref Range   Glucose-Capillary 152 (H) 70 - 99 mg/dL  Glucose, capillary     Status: Abnormal   Collection Time: 08/04/14  8:52 PM  Result Value Ref Range   Glucose-Capillary 211 (H) 70 - 99 mg/dL  Glucose, capillary     Status: Abnormal   Collection Time: 08/05/14  6:57 AM  Result Value Ref Range   Glucose-Capillary 135 (H) 70 - 99 mg/dL  Glucose, capillary     Status: Abnormal   Collection Time: 08/05/14 11:24 AM  Result Value Ref Range   Glucose-Capillary 197 (H) 70 - 99 mg/dL  CBC     Status: Abnormal   Collection Time: 08/05/14  3:44 PM  Result Value Ref Range   WBC 10.7 (H) 4.0 - 10.5 K/uL   RBC 2.87 (L) 3.87 - 5.11 MIL/uL   Hemoglobin 9.1 (L) 12.0 - 15.0 g/dL   HCT 28.6 (L) 36.0 - 46.0 %   MCV 99.7 78.0 - 100.0 fL   MCH 31.7 26.0 - 34.0 pg   MCHC 31.8 30.0 - 36.0 g/dL   RDW 19.7 (H) 11.5 - 15.5 %   Platelets 250 150 - 400 K/uL  Renal function panel     Status: Abnormal   Collection Time: 08/05/14  3:44 PM  Result Value Ref Range   Sodium 136 (L) 137 - 147 mEq/L   Potassium 4.9 3.7 - 5.3 mEq/L   Chloride 93 (L) 96 - 112 mEq/L   CO2 23 19 - 32 mEq/L   Glucose, Bld 176 (H) 70 - 99 mg/dL   BUN 72 (H) 6 - 23 mg/dL   Creatinine, Ser 6.10 (H) 0.50 - 1.10 mg/dL   Calcium 9.4 8.4 - 10.5 mg/dL    Phosphorus 2.9 2.3 - 4.6 mg/dL   Albumin 3.4 (L) 3.5 - 5.2 g/dL   GFR calc non Af Amer 6 (L) >90 mL/min   GFR calc Af Amer 7 (L) >90 mL/min    Comment: (NOTE) The eGFR has been calculated using the CKD EPI equation. This calculation has not been validated in all clinical situations. eGFR's persistently <90 mL/min signify possible Chronic Kidney Disease.    Anion gap 20 (H) 5 - 15  Prepare RBC     Status: None   Collection Time: 08/05/14  4:51 PM  Result Value Ref Range   Order Confirmation ORDER PROCESSED BY BLOOD BANK   Glucose, capillary     Status: Abnormal   Collection Time: 08/05/14  8:56 PM  Result Value Ref Range   Glucose-Capillary 114 (H) 70 - 99 mg/dL   Comment 1 Notify RN   CBC     Status: Abnormal   Collection Time: 08/06/14  4:54 AM  Result Value Ref Range   WBC 10.7 (H) 4.0 - 10.5 K/uL  RBC 3.14 (L) 3.87 - 5.11 MIL/uL   Hemoglobin 9.9 (L) 12.0 - 15.0 g/dL   HCT 30.8 (L) 36.0 - 46.0 %   MCV 98.1 78.0 - 100.0 fL   MCH 31.5 26.0 - 34.0 pg   MCHC 32.1 30.0 - 36.0 g/dL   RDW 20.8 (H) 11.5 - 15.5 %   Platelets 215 150 - 400 K/uL  Glucose, capillary     Status: Abnormal   Collection Time: 08/06/14  6:53 AM  Result Value Ref Range   Glucose-Capillary 165 (H) 70 - 99 mg/dL   Comment 1 Notify RN   Glucose, capillary     Status: Abnormal   Collection Time: 08/06/14 11:46 AM  Result Value Ref Range   Glucose-Capillary 122 (H) 70 - 99 mg/dL  Glucose, capillary     Status: Abnormal   Collection Time: 08/06/14  4:43 PM  Result Value Ref Range   Glucose-Capillary 208 (H) 70 - 99 mg/dL   Comment 1 Notify RN   Glucose, capillary     Status: Abnormal   Collection Time: 08/06/14  8:50 PM  Result Value Ref Range   Glucose-Capillary 172 (H) 70 - 99 mg/dL     HEENT: normal Cardio: RRR and no murmur Resp: CTA B/L and unlabored GI: BS positive and NT, ND Extremity:  Pulses positive and trace LE Edema Skin:   Intact Neuro: Alert/Oriented. Improved trunk  control Musc/Skel:  Swelling Left arm, with good thrill at AV graft site--minimal pain Gen NAD   Assessment/Plan: 1. Functional deficits secondary to Critical illness myopathy which require 3+ hours per day of interdisciplinary therapy in a comprehensive inpatient rehab setting. Physiatrist is providing close team supervision and 24 hour management of active medical problems listed below. Physiatrist and rehab team continue to assess barriers to discharge/monitor patient progress toward functional and medical goals.  FIM: FIM - Bathing Bathing Steps Patient Completed: Chest, Right Arm, Left Arm, Abdomen, Right upper leg, Left upper leg, Front perineal area Bathing: 3: Mod-Patient completes 5-7 5f10 parts or 50-74%  FIM - Upper Body Dressing/Undressing Upper body dressing/undressing steps patient completed: Thread/unthread right sleeve of pullover shirt/dresss, Put head through opening of pull over shirt/dress, Thread/unthread left sleeve of pullover shirt/dress, Pull shirt over trunk, Thread/unthread left bra strap, Thread/unthread right bra strap Upper body dressing/undressing: 4: Min-Patient completed 75 plus % of tasks FIM - Lower Body Dressing/Undressing Lower body dressing/undressing steps patient completed: Thread/unthread left underwear leg Lower body dressing/undressing: 0: Activity did not occur  FIM - Toileting Toileting steps completed by patient: Performs perineal hygiene Toileting: 0: Activity did not occur  FIM - TRadio producerDevices: Bedside commode, Sliding board Toilet Transfers: 2-To toilet/BSC: Max A (lift and lower assist)  FIM - BControl and instrumentation engineerDevices: Sliding board, Arm rests Bed/Chair Transfer: 4: Sit > Supine: Min A (steadying pt. > 75%/lift 1 leg), 2: Bed > Chair or W/C: Max A (lift and lower assist)  FIM - Locomotion: Wheelchair Distance: 40 Locomotion: Wheelchair: 1: Travels less than 50 ft  with supervision, cueing or coaxing FIM - Locomotion: Ambulation Ambulation/Gait Assistance: Not tested (comment) Locomotion: Ambulation: 0: Activity did not occur  Comprehension Comprehension Mode: Auditory Comprehension: 5-Follows basic conversation/direction: With extra time/assistive device  Expression Expression Mode: Verbal Expression: 5-Expresses basic needs/ideas: With extra time/assistive device  Social Interaction Social Interaction: 5-Interacts appropriately 90% of the time - Needs monitoring or encouragement for participation or interaction.  Problem Solving Problem Solving: 5-Solves basic problems:  With no assist  Memory Memory: 4-Recognizes or recalls 75 - 89% of the time/requires cueing 10 - 24% of the time  Medical Problem List and Plan: 1. Functional deficits secondary to Critical illness myopathy.   2.  DVT Prophylaxis/Anticoagulation: Pharmaceutical: Lovenox, pt refusing drop in Hgb will check stool guaic and hold, use SCDs 3. Pain Management: Prn oxycodone.   4. Mood: LCSW to follow for evaluation and support. Team to provide egosupport as well.   5. Neuropsych: This patient is capable of making decisions on her own behalf. 6. Skin/Wound Care: continue foam dressing to sacrum. Order Prevalon boot for right foot as well as elevation when in bed.   7. Fluids/Electrolytes/Nutrition: Strict I/O. 1200 FR due to ESRD. 8. ESRD: HD after therapy day. Nephrology to manage dialysis,               -AVG site minimally tender  9. CAD: metoprolol, ASA, monitor clinically with functional activities 10. A fib: rate controlled at present. Continue metoprolol   12. DM type II: sliding scale insulin. Check cbg's ac and hs             -on amaryl at home,  resumed for CBGs above 200---follow today 13.  Heme + stool, microscopic, monitor for frank red blood, likely small anal fissure or hemorrhoid plus lovenox, will monitor, transfused in HD  -serial H/H's (9.9 yesterday) LOS  (Days) 9 A FACE TO FACE EVALUATION WAS PERFORMED  SWARTZ,ZACHARY T 08/07/2014, 7:52 AM

## 2014-08-08 ENCOUNTER — Encounter (HOSPITAL_COMMUNITY): Payer: BC Managed Care – PPO

## 2014-08-08 ENCOUNTER — Inpatient Hospital Stay (HOSPITAL_COMMUNITY): Payer: Medicare Other

## 2014-08-08 ENCOUNTER — Inpatient Hospital Stay (HOSPITAL_COMMUNITY): Payer: BC Managed Care – PPO | Admitting: Speech Pathology

## 2014-08-08 ENCOUNTER — Inpatient Hospital Stay (HOSPITAL_COMMUNITY): Payer: Medicare Other | Admitting: Occupational Therapy

## 2014-08-08 DIAGNOSIS — T82818A Embolism of vascular prosthetic devices, implants and grafts, initial encounter: Secondary | ICD-10-CM

## 2014-08-08 LAB — CBC
HCT: 30.6 % — ABNORMAL LOW (ref 36.0–46.0)
Hemoglobin: 9.9 g/dL — ABNORMAL LOW (ref 12.0–15.0)
MCH: 30.8 pg (ref 26.0–34.0)
MCHC: 32.4 g/dL (ref 30.0–36.0)
MCV: 95.3 fL (ref 78.0–100.0)
PLATELETS: 248 10*3/uL (ref 150–400)
RBC: 3.21 MIL/uL — ABNORMAL LOW (ref 3.87–5.11)
RDW: 19.7 % — ABNORMAL HIGH (ref 11.5–15.5)
WBC: 11.4 10*3/uL — AB (ref 4.0–10.5)

## 2014-08-08 LAB — RENAL FUNCTION PANEL
ALBUMIN: 3.3 g/dL — AB (ref 3.5–5.2)
Anion gap: 20 — ABNORMAL HIGH (ref 5–15)
BUN: 73 mg/dL — ABNORMAL HIGH (ref 6–23)
CALCIUM: 9.6 mg/dL (ref 8.4–10.5)
CO2: 23 mEq/L (ref 19–32)
CREATININE: 6.54 mg/dL — AB (ref 0.50–1.10)
Chloride: 89 mEq/L — ABNORMAL LOW (ref 96–112)
GFR calc non Af Amer: 6 mL/min — ABNORMAL LOW (ref >90)
GFR, EST AFRICAN AMERICAN: 7 mL/min — AB (ref >90)
GLUCOSE: 82 mg/dL (ref 70–99)
PHOSPHORUS: 3.7 mg/dL (ref 2.3–4.6)
Potassium: 4.8 mEq/L (ref 3.7–5.3)
Sodium: 132 mEq/L — ABNORMAL LOW (ref 137–147)

## 2014-08-08 LAB — GLUCOSE, CAPILLARY
GLUCOSE-CAPILLARY: 167 mg/dL — AB (ref 70–99)
GLUCOSE-CAPILLARY: 262 mg/dL — AB (ref 70–99)
Glucose-Capillary: 194 mg/dL — ABNORMAL HIGH (ref 70–99)
Glucose-Capillary: 89 mg/dL (ref 70–99)

## 2014-08-08 MED ORDER — MIDODRINE HCL 5 MG PO TABS
ORAL_TABLET | ORAL | Status: AC
  Filled 2014-08-08: qty 2

## 2014-08-08 NOTE — Consult Note (Signed)
[facility] NOTE   MRN : [id number]  Reason for Consult: Left AV fistula hematoma Referring Physician: Dr. Arlean Hopping  History of Present Illness: 68 y/o female with CKD currently on dialysis via catheter.  She has a left AV fistula that has been unable to cannulate per Nephrology.  Shuntogram performed  06/27/2014 at "[facility] due to infiltration of fistula 06/24/2014.  The patient was admitted w/ arrest at vascular access center (third arrest total) in setting of severe ASCVD."  We have been asked to evaluate her fistula.  The AV fistula was created in 2011.   Past medical history includes:severe multivessel CAD (not candidate for percutaneous intervention), ESRD on HD, and PAD s/p R- BKA, h/o prior cardiac arrest X 2; who was admitted to on 06/27/2014 due to cardiopulmonary arrest past having HD cath placed at outpatient center. She received CPR for 2 minutes with return of spontaneous circulation and was unresponsive w/ ineffective ventilation and posturing on arrival to ED. She was placed under the hypothermia protocol. 2D echo with EF 45-50% with mild LVF and elevated pulmonary pressures. On 06/29/2014 she was rewarmed and extubated but remained lethargic and nonverbal with concerns of anoxic encephalopathy.   Current Facility-Administered Medications  Medication Dose Route Frequency Provider Last Rate Last Dose  . 0.9 %  sodium chloride infusion   Intravenous Once Camille Bal, MD      . 0.9 %  sodium chloride infusion   Intravenous Once Camille Bal, MD      . acetaminophen (TYLENOL) tablet 325-650 mg  325-650 mg Oral Q4H PRN Jacquelynn Cree, PA-C   325 mg at 08/06/14 2341  . antiseptic oral rinse (BIOTENE) solution 15 mL  15 mL Mouth Rinse PRN Jacquelynn Cree, PA-C      . aspirin EC tablet 81 mg  81 mg Oral Daily Jacquelynn Cree, PA-C   81 mg at 08/08/14 0908  . Darbepoetin Alfa (ARANESP) injection 150 mcg  150 mcg Intravenous Q Wed-HD Camille Bal,  MD   150 mcg at 08/02/14 1656  . diphenhydrAMINE (BENADRYL) 12.5 MG/5ML elixir 12.5-25 mg  12.5-25 mg Oral Q6H PRN Jacquelynn Cree, PA-C      . docusate sodium (COLACE) capsule 100 mg  100 mg Oral BID Erick Colace, MD   100 mg at 08/06/14 2115  . docusate sodium (ENEMEEZ) enema 283 mg  1 enema Rectal Daily PRN Jacquelynn Cree, PA-C      . doxercalciferol (HECTOROL) injection 5 mcg  5 mcg Intravenous Q M,W,F-HD Derrill Kay, NP   5 mcg at 08/05/14 1739  . feeding supplement (NEPRO CARB STEADY) liquid 237 mL  237 mL Oral TID WC Evlyn Kanner Love, PA-C   237 mL at 08/08/14 1250  . [START ON 08/10/2014] ferric gluconate (NULECIT) 62.5 mg in sodium chloride 0.9 % 100 mL IVPB  62.5 mg Intravenous Q Wed-HD Erick Colace, MD      . glimepiride (AMARYL) tablet 2 mg  2 mg Oral Q breakfast Erick Colace, MD   2 mg at 08/08/14 0908  . hydrocerin (EUCERIN) cream   Topical BID Evlyn Kanner Love, PA-C      . insulin aspart (novoLOG) injection 0-15 Units  0-15 Units Subcutaneous TID WC Lindley Magnus, MD   3 Units at 08/08/14 1251  . metoCLOPramide (REGLAN) tablet 10 mg  10 mg Oral TID AC & HS Evlyn Kanner Love, PA-C   10 mg at 08/08/14  1252  . metoprolol tartrate (LOPRESSOR) tablet 12.5 mg  12.5 mg Oral 4 times per day on Sun Tue Thu Sat Jacquelynn Creeamela S Love, PA-C   12.5 mg at 08/07/14 2147  . midodrine (PROAMATINE) tablet 5 mg  5 mg Oral Q M,W,F-HD Evlyn KannerPamela S Love, PA-C   5 mg at 08/05/14 1211  . multivitamin (RENA-VIT) tablet 1 tablet  1 tablet Oral QHS Jacquelynn CreePamela S Love, PA-C   1 tablet at 08/07/14 2147  . ondansetron (ZOFRAN) tablet 4 mg  4 mg Oral Q6H PRN Jacquelynn CreePamela S Love, PA-C       Or  . ondansetron [facility](ZOFRAN) injection 4 mg  4 mg Intravenous Q6H PRN Jacquelynn CreePamela S Love, PA-C      . oxyCODONE (Oxy IR/ROXICODONE) immediate release tablet 5-10 mg  5-10 mg Oral Q4H PRN Jacquelynn CreePamela S Love, PA-C      . pantoprazole (PROTONIX) EC tablet 40 mg  40 mg Oral Daily Jacquelynn Creeamela S Love, PA-C   40 mg at 08/08/14 [id number]  . polyethylene glycol  (MIRALAX / GLYCOLAX) packet 17 g  17 g Oral Daily Jacquelynn Creeamela S Love, PA-C   17 g at 08/06/14 [id number]  . sevelamer carbonate (RENVELA) tablet 800 mg  800 mg Oral TID WC Gerome Apleyharles Lyles, PA-C   800 mg at 08/08/14 1252  . sorbitol 70 % solution 30 mL  30 mL Oral TID PRN Jacquelynn CreePamela S Love, PA-C        Pt meds include: Statin :No Betablocker: Yes ASA: Yes Other anticoagulants/antiplatelets: none  Past Medical History  Diagnosis Date  . PAD (peripheral artery disease)     a. L BKA  . Type II diabetes mellitus   . Anemia   . History of blood transfusion 2002    "related to amputation" (08/13/2013)  . End stage renal disease [facility] dialysis     [facility]" (08/13/2013)  . DVT (deep venous thrombosis)     a. R IJ DVT 08/2013.  Marland Kitchen. CVA (cerebral infarction)     a. MRI 08/2013: possible left motor strip infarct with remote pontine infarct.  . Pulmonary HTN     a. Cath 2011 - mod to severe pulmonary hypertension. b. Echo 08/2013: PAP 45mmHg (mild-mod increased).  . Thrombocytopenia   . CAD (coronary artery disease)     a. Severe diffuse diabetic disease by cath 2011, for med rx, not a candidate for CABG or PCI at that time.  . Aortic stenosis     mild  . Syncope     a. 2011, etiology never clear.  . Carotid artery disease     a. Dopp [id number]/578412/2014: 1-39% LICA< 60-79% RICA borderline >80%.  . Cerebrovascular disease     a. 08/2013 MRA brain: multiple bilateral moderate and high grade intracranial stenoses  . Cardiac arrest 08/14/13    a. Possible arrest in setting of hypotension during dialysis with possible arrest requiring brief CPR, not intubated and was fully intact after episode. Suspected due to low cerebral perfusion in setting of hypotension. Unclear if any arrhythmia.  . End stage renal disease on dialysis   . Peripheral arterial occlusive disease   . Hypertension   . Diabetes mellitus type 2, uncontrolled, with complications   . DVT (deep venous thrombosis)      Right internal  jugular vein  . CAD (coronary artery disease), native coronary artery 2011    Diffuse distal vessel CAD noted at cardiac catheterization  . MI (myocardial infarction)   . Stroke   .  GIB (gastrointestinal bleeding)     Past Surgical History  Procedure Laterality Date  . Av fistula placement Left 2011    upper arm  . Below knee leg amputation Left 2002  . Tonsillectomy    . Cataract extraction w/ intraocular lens  implant, bilateral Bilateral 2012-2014  . Esophagogastroduodenoscopy N/A 08/30/2013    Procedure: ESOPHAGOGASTRODUODENOSCOPY (EGD);  Surgeon: Theda Belfast, MD;  Location[facility] ENDOSCOPY;  Service: Endoscopy;  Laterality: N/A;  . Below knee leg amputation Left   . Av fistula placement Left     Social History History  Substance Use Topics  . Smoking status: Never Smoker   . Smokeless tobacco: Not on file  . Alcohol Use: No    Family History Family History  Problem Relation Age of Onset  . Hypertension Mother   . Hypertension Father   . Heart attack Brother   . Stroke Mother   . Pulmonary embolism Father     Allergies  Allergen Reactions  . Phenergan [Promethazine Hcl] Other (See Comments)    Causes patient to" vomit more"  . Vitamin D Analogs Rash     REVIEW OF SYSTEMS  General: [ ]  Weight loss, [ ]  Fever, [ ]  chills [x]  poor historian Neurologic: [ ]  Dizziness, [ ]  Blackouts, [ ]  Seizure [ ]  Stroke, [ ]  "Mini stroke", [ ]  Slurred speech, [ ]  Temporary blindness; [ ]  weakness in arms or legs, [ ]  Hoarseness [ ]  Dysphagia Cardiac: [ ]  Chest pain/pressure, [ ]  Shortness of breath at rest [ ]  Shortness of breath with exertion, [ ]  Atrial fibrillation or irregular heartbeat  Vascular: [ ]  Pain in legs with walking, [ ]  Pain in legs at rest, [ ]  Pain in legs at night,  [ ]  Non-healing ulcer, [ ]  Blood clot in vein/DVT,   Pulmonary: [ ]  Home oxygen, [ ]  Productive cough, [ ]  Coughing up blood, [ ]  Asthma,  [ ]  Wheezing [ ]  COPD Musculoskeletal:  [ ]  Arthritis,  [ ]  Low back pain, [ ]  Joint pain Hematologic: [ ]  Easy Bruising, [x ] Anemia; [ ]  Hepatitis Gastrointestinal: [ ]  Blood in stool, [ ]  Gastroesophageal Reflux/heartburn, Urinary: [x ] chronic Kidney disease, [ ]  on HD - [x ] MWF or [ ]  TTHS, [ ]  Burning with urination, [ ]  Difficulty urinating Skin: [ ]  Rashes, [ ]  Wounds Psychological: [ ]  Anxiety, [x ] Depression  Physical Examination Filed Vitals:   08/07/14 1400 08/07/14 2147 08/08/14 0500 08/08/14 0815  BP: 141/70 125/58 156/65   Pulse: 71 67 67   Temp: 98.5 F (36.9 C)  98.9 F (37.2 C)   TempSrc: Oral  Oral   Resp: 20  20   Weight:   180 lb 3.2 oz (81.738 kg)   SpO2: 2%  100% 98%   Body mass index is 29.1 kg/(m^2).  General:  WDWN in NAD Gait: Normal HENT: WNL Eyes: Pupils equal Pulmonary: normal non-labored breathing , without Rales, rhonchi,  wheezing Cardiac: RRR, without  Murmurs, rubs or gallops; No carotid bruits Abdomen: soft, NT, no masses Skin: no rashes, ulcers noted;  no Gangrene , no cellulitis; no open wounds;   Vascular Exam/Pulses:Left AV fistula anastomosis palpable thrill,  firm hematoma and no palpable thrill above hematoma.  Left hand palpable radial pulses no signs of steal.  Right Radial and brachial palpable pulses.     Musculoskeletal: no muscle wasting or atrophy; no edema  Neurologic: A&O X 3; Appropriate Affect ;  SENSATION:  normal; MOTOR FUNCTION: 5/5 Symmetric grossly right BKA Speech is fluent/normal   Significant Diagnostic Studies: CBC Lab Results  Component Value Date   WBC 10.7* 08/06/2014   HGB 9.9* 08/06/2014   HCT 30.8* 08/06/2014   MCV 98.1 08/06/2014   PLT 215 08/06/2014    BMET    Component Value Date/Time   NA 136* 08/05/2014 1544   K 4.9 08/05/2014 1544   CL 93* 08/05/2014 1544   CO2 23 08/05/2014 1544   GLUCOSE 176* 08/05/2014 1544   BUN 72* 08/05/2014 1544   CREATININE 6.10* 08/05/2014 1544   CALCIUM 9.4 08/05/2014 1544   GFRNONAA 6* 08/05/2014 1544    GFRAA 7* 08/05/2014 1544   Estimated Creatinine Clearance: 9.5 mL/min (by C-G formula based on Cr of 6.1).  COAG Lab Results  Component Value Date   INR 1.18 06/27/2014   INR 1.31 06/27/2014   INR 1.06 11/22/2013     Non-Invasive Vascular Imaging: pending right upper extremity vein mapping  ASSESSMENT/PLAN:  Chronic hematoma left AV fistula since 06/24/2014 Dialysis via IJ catheter placed at [facility] Dr. Juel BurrowLin 06/27/2014 Dialysis M-W-F. I will order vein mapping of her right upper extremity for possible future access.  I will discuss further work up with Dr. Darrick PennaFields.   COLLINS, EMMA MAUREEN 08/08/2014 2:00 PM   Pt seen and examined details as above.  Apparently had infiltration and hematoma development approx 4-6 weeks ago.  Currently using catheter for dialysis.    On exam there is an easily palpable thrill in the fistula.  There is some unresolved hematoma posteriorly  Plan:  Would attempt to cannulate fistula at this point.  The hematoma is not compressing the fistula significantly and may take months to resolve completely.  More harm than good at this point to evacuate hematoma if able to cannulate the fistula.  Fabienne Brunsharles Fields, MD [facility] [place] Office: [phone] Pager: [phone]

## 2014-08-08 NOTE — Progress Notes (Signed)
Subjective/Complaints: Questions about eye drops, started to have blurring of vision in Left eye since this was started, Pt denies other issues No eye pain or drainage or itching Review of Systems - otherwise neg Objective: Vital Signs: Blood pressure 156/65, pulse 67, temperature 98.9 F (37.2 C), temperature source Oral, resp. rate 20, weight 81.738 kg (180 lb 3.2 oz), SpO2 100 %. No results found. Results for orders placed or performed during the hospital encounter of 07/29/14 (from the past 72 hour(s))  Glucose, capillary     Status: Abnormal   Collection Time: 08/05/14 11:24 AM  Result Value Ref Range   Glucose-Capillary 197 (H) 70 - 99 mg/dL  CBC     Status: Abnormal   Collection Time: 08/05/14  3:44 PM  Result Value Ref Range   WBC 10.7 (H) 4.0 - 10.5 K/uL   RBC 2.87 (L) 3.87 - 5.11 MIL/uL   Hemoglobin 9.1 (L) 12.0 - 15.0 g/dL   HCT 28.6 (L) 36.0 - 46.0 %   MCV 99.7 78.0 - 100.0 fL   MCH 31.7 26.0 - 34.0 pg   MCHC 31.8 30.0 - 36.0 g/dL   RDW 19.7 (H) 11.5 - 15.5 %   Platelets 250 150 - 400 K/uL  Renal function panel     Status: Abnormal   Collection Time: 08/05/14  3:44 PM  Result Value Ref Range   Sodium 136 (L) 137 - 147 mEq/L   Potassium 4.9 3.7 - 5.3 mEq/L   Chloride 93 (L) 96 - 112 mEq/L   CO2 23 19 - 32 mEq/L   Glucose, Bld 176 (H) 70 - 99 mg/dL   BUN 72 (H) 6 - 23 mg/dL   Creatinine, Ser 6.10 (H) 0.50 - 1.10 mg/dL   Calcium 9.4 8.4 - 10.5 mg/dL   Phosphorus 2.9 2.3 - 4.6 mg/dL   Albumin 3.4 (L) 3.5 - 5.2 g/dL   GFR calc non Af Amer 6 (L) >90 mL/min   GFR calc Af Amer 7 (L) >90 mL/min    Comment: (NOTE) The eGFR has been calculated using the CKD EPI equation. This calculation has not been validated in all clinical situations. eGFR's persistently <90 mL/min signify possible Chronic Kidney Disease.    Anion gap 20 (H) 5 - 15  Prepare RBC     Status: None   Collection Time: 08/05/14  4:51 PM  Result Value Ref Range   Order Confirmation ORDER PROCESSED  BY BLOOD BANK   Glucose, capillary     Status: Abnormal   Collection Time: 08/05/14  8:56 PM  Result Value Ref Range   Glucose-Capillary 114 (H) 70 - 99 mg/dL   Comment 1 Notify RN   CBC     Status: Abnormal   Collection Time: 08/06/14  4:54 AM  Result Value Ref Range   WBC 10.7 (H) 4.0 - 10.5 K/uL   RBC 3.14 (L) 3.87 - 5.11 MIL/uL   Hemoglobin 9.9 (L) 12.0 - 15.0 g/dL   HCT 30.8 (L) 36.0 - 46.0 %   MCV 98.1 78.0 - 100.0 fL   MCH 31.5 26.0 - 34.0 pg   MCHC 32.1 30.0 - 36.0 g/dL   RDW 20.8 (H) 11.5 - 15.5 %   Platelets 215 150 - 400 K/uL  Glucose, capillary     Status: Abnormal   Collection Time: 08/06/14  6:53 AM  Result Value Ref Range   Glucose-Capillary 165 (H) 70 - 99 mg/dL   Comment 1 Notify RN   Glucose, capillary  Status: Abnormal   Collection Time: 08/06/14 11:46 AM  Result Value Ref Range   Glucose-Capillary 122 (H) 70 - 99 mg/dL  Glucose, capillary     Status: Abnormal   Collection Time: 08/06/14  4:43 PM  Result Value Ref Range   Glucose-Capillary 208 (H) 70 - 99 mg/dL   Comment 1 Notify RN   Glucose, capillary     Status: Abnormal   Collection Time: 08/06/14  8:50 PM  Result Value Ref Range   Glucose-Capillary 172 (H) 70 - 99 mg/dL  Glucose, capillary     Status: Abnormal   Collection Time: 08/07/14  7:42 AM  Result Value Ref Range   Glucose-Capillary 154 (H) 70 - 99 mg/dL  Glucose, capillary     Status: Abnormal   Collection Time: 08/07/14 11:09 AM  Result Value Ref Range   Glucose-Capillary 132 (H) 70 - 99 mg/dL  Glucose, capillary     Status: None   Collection Time: 08/07/14  4:41 PM  Result Value Ref Range   Glucose-Capillary 79 70 - 99 mg/dL  Glucose, capillary     Status: Abnormal   Collection Time: 08/07/14  8:45 PM  Result Value Ref Range   Glucose-Capillary 172 (H) 70 - 99 mg/dL  Glucose, capillary     Status: Abnormal   Collection Time: 08/08/14  7:06 AM  Result Value Ref Range   Glucose-Capillary 167 (H) 70 - 99 mg/dL     HEENT:  normal, eyes non injected, no drainage Cardio: RRR and no murmur Resp: CTA B/L and unlabored GI: BS positive and NT, ND Extremity:  Pulses positive and trace LE Edema Skin:   Intact Neuro: Alert/Oriented. Improved trunk control Musc/Skel:  Swelling Left arm, with good thrill at AV graft site--minimal pain Gen NAD   Assessment/Plan: 1. Functional deficits secondary to Critical illness myopathy which require 3+ hours per day of interdisciplinary therapy in a comprehensive inpatient rehab setting. Physiatrist is providing close team supervision and 24 hour management of active medical problems listed below. Physiatrist and rehab team continue to assess barriers to discharge/monitor patient progress toward functional and medical goals.  FIM: FIM - Bathing Bathing Steps Patient Completed: Chest, Right Arm, Left Arm, Abdomen, Right upper leg, Left upper leg, Front perineal area Bathing: 3: Mod-Patient completes 5-7 51f10 parts or 50-74%  FIM - Upper Body Dressing/Undressing Upper body dressing/undressing steps patient completed: Thread/unthread right sleeve of pullover shirt/dresss, Put head through opening of pull over shirt/dress, Thread/unthread left sleeve of pullover shirt/dress, Pull shirt over trunk, Thread/unthread left bra strap, Thread/unthread right bra strap Upper body dressing/undressing: 4: Min-Patient completed 75 plus % of tasks FIM - Lower Body Dressing/Undressing Lower body dressing/undressing steps patient completed: Thread/unthread left underwear leg Lower body dressing/undressing: 0: Activity did not occur  FIM - Toileting Toileting steps completed by patient: Performs perineal hygiene Toileting: 0: Activity did not occur  FIM - TRadio producerDevices: Bedside commode, Sliding board Toilet Transfers: 2-To toilet/BSC: Max A (lift and lower assist)  FIM - BControl and instrumentation engineerDevices: Sliding board, Arm  rests Bed/Chair Transfer: 2: Bed > Chair or W/C: Max A (lift and lower assist)  FIM - Locomotion: Wheelchair Distance: 40 Locomotion: Wheelchair: 0: Activity did not occur FIM - Locomotion: Ambulation Ambulation/Gait Assistance: Not tested (comment) Locomotion: Ambulation: 0: Activity did not occur  Comprehension Comprehension Mode: Auditory Comprehension: 5-Follows basic conversation/direction: With extra time/assistive device  Expression Expression Mode: Verbal Expression: 5-Expresses basic needs/ideas: With extra time/assistive  device  Social Interaction Social Interaction: 4-Interacts appropriately 75 - 89% of the time - Needs redirection for appropriate language or to initiate interaction.  Problem Solving Problem Solving: 4-Solves basic 75 - 89% of the time/requires cueing 10 - 24% of the time  Memory Memory: 4-Recognizes or recalls 75 - 89% of the time/requires cueing 10 - 24% of the time  Medical Problem List and Plan: 1. Functional deficits secondary to Critical illness myopathy.   2.  DVT Prophylaxis/Anticoagulation: Pharmaceutical: Lovenox, pt refusing drop in Hgb will check stool guaic and hold, use SCDs 3. Pain Management: Prn oxycodone.   4. Mood: LCSW to follow for evaluation and support. Team to provide egosupport as well.   5. Neuropsych: This patient is capable of making decisions on her own behalf. 6. Skin/Wound Care: continue foam dressing to sacrum. Order Prevalon boot for right foot as well as elevation when in bed.   7. Fluids/Electrolytes/Nutrition: Strict I/O. 1200 FR due to ESRD. 8. ESRD: HD after therapy day. Nephrology to manage dialysis,               -AVG site minimally tender  9. CAD: metoprolol, ASA, monitor clinically with functional activities 10. A fib: rate controlled at present. Continue metoprolol   12. DM type II: sliding scale insulin. Check cbg's ac and hs             -on amaryl at home,  resumed for CBGs above 200---follow today 13.   Heme + stool, microscopic, monitor for frank red blood, likely small anal fissure or hemorrhoid plus lovenox, will monitor, transfused in HD  -serial H/H's (9.9 yesterday) 14.  VBisual blurring, I do not see an indication for prednisolone gtt, will d/c and observe LOS (Days) 10 A FACE TO FACE EVALUATION WAS PERFORMED  KIRSTEINS,ANDREW E 08/08/2014, 8:27 AM

## 2014-08-08 NOTE — Progress Notes (Signed)
Occupational Therapy Session Note  Patient Details  Name: Briana Gould MRN: [id number] Date of Birth: 1945/10/29  Today's Date: 08/08/2014 OT Individual Time: 0800-0900 OT Individual Time Calculation (min): 60 min    Short Term Goals: Week 2:  OT Short Term Goal 1 (Week 2): [facility]. OT Short Term Goal 2 (Week 2): Pt will be able to don pants over both legs from supine position. OT Short Term Goal 3 (Week 2): Pt will be able to don underwear and/or pants over hips with min A by rolling in bed. OT Short Term Goal 4 (Week 2): Pt will be able to lateral lean to both sides on BSC to be able to perform hygiene.  Skilled Therapeutic Interventions/Progress Updates:    Pt began bathing sitting in wheelchair at the sink since she was already up.  Decreased thoroughness with bathing overall with pt needing mod demonstrational cueing to use soap and wash completely.  She was able to donn bra and shirt in sitting but needed therapist to fasten it in the back and pulling the shirt down in the back.  Pt reports usually fastening the bra and then spinning it around to donn.  Transferred via sliding board with max assist to the bed for LB bathing and dressing.  Pt needed max assist for lateral leans far enough to wash peri area and pull out dirty brief.  Transitioned to supine for rolling side to side for thoroughness with peri hygiene.  Pt needed total assist for removing the right sock, washing her foot, and then donning sock.  Therapist assisted with application of new brief under her as well as threading pants over feet in supine as pt needed buttocks wound looked at and dressed by nursing.    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

## 2014-08-08 NOTE — Progress Notes (Signed)
Speech Language Pathology Daily Session Note  Patient Details  Name: Briana Gould MRN: [id number] Date of Birth: 04/09/1946  Today's Date: 08/08/2014 SLP Individual Time: 1000-1100 SLP Individual Time Calculation (min): 60 min  Short Term Goals: Week 2: SLP Short Term Goal 1 (Week 2): Pt will tolerate her currently prescribed diet with no overt s/s of aspiration and mod I use of swallowing precautions.  SLP Short Term Goal 2 (Week 2): Patient will utilize external memory aids to recall new, daily information with supervision multimodal cues.  SLP Short Term Goal 3 (Week 2): Patient will demonstrate functional problem solving for mildly complex tasks with supervision multimodal cues.  SLP Short Term Goal 4 (Week 2): Patient will self-monitor and correct errors during functional tasks with supervision question cues.   Skilled Therapeutic Interventions: Skilled treatment session focused on addressing cognition goals. Student facilitated session by providing Min A multimodal cues to self-correct errors during a mildly complex task via mock shopping catalog and order form. Patient reported blurred vision in left eye resulting in difficulty completing task, therefore, student modified task and facilitated session by providing extra time and Min A question cues for problem solving and self-monitoring during a mildly complex card game, "Blink".  Patient required supervision question cues to recall events from previous therapy sessions and for route finding back to room. Continue with current plan of care.    FIM:  Comprehension Comprehension Mode: Auditory Comprehension: 5-Follows basic conversation/direction: With extra time/assistive device Expression Expression Mode: Verbal Expression: 5-Expresses basic needs/ideas: With extra time/assistive device Social Interaction Social Interaction: 4-Interacts appropriately 75 - 89% of the time - Needs redirection for appropriate language or to initiate  interaction. Problem Solving Problem Solving: 4-Solves basic 75 - 89% of the time/requires cueing 10 - 24% of the time Memory Memory: 4-Recognizes or recalls 75 - 89% of the time/requires cueing 10 - 24% of the time  Pain Pain Assessment Pain Assessment: No/denies pain Pain Score: 0-No pain  Therapy/Group: Individual Therapy  Marny Smethers 08/08/2014, 1:08 PM

## 2014-08-08 NOTE — Plan of Care (Signed)
Problem: RH BOWEL ELIMINATION Goal: RH STG MANAGE BOWEL WITH ASSISTANCE STG Manage Bowel with min Assistance.  Outcome: Progressing LBM 11/30 Goal: RH STG MANAGE BOWEL W/MEDICATION W/ASSISTANCE STG Manage Bowel with Medication with min Assistance.  Outcome: Progressing LBM 11/30  Problem: RH SAFETY Goal: RH STG ADHERE TO SAFETY PRECAUTIONS W/ASSISTANCE/DEVICE STG Adhere to Safety Precautions With min Assistance/Device.  Outcome: Progressing Goal: RH STG DECREASED RISK OF FALL WITH ASSISTANCE STG Decreased Risk of Fall With min Assistance.  Outcome: Progressing

## 2014-08-08 NOTE — Progress Notes (Signed)
[facility] Progress Note   Subjective: no complaints, no SOB or CP  Filed Vitals:   08/07/14 0500 08/07/14 1400 08/07/14 2147 08/08/14 0500  BP:  141/70 125/58 156/65  Pulse:  71 67 67  Temp:  98.5 F (36.9 C)  98.9 F (37.2 C)  TempSrc:  Oral  Oral  Resp:  20  20  Weight: 83 kg (182 lb 15.7 oz)   81.738 kg (180 lb 3.2 oz)  SpO2:  2%  100%   Exam: Alert, up in WC No jvd RRR 1/6 SEM LSB Chest clear bilat Abd soft, NTND, no ascites Left BKA, RLE 2+ edema Neuro is nf, Ox 3 Left IJ cath and LUA AVF patent with firm hematoma present  HD: MWF [place]  4h   75kg   2/2.0 Bath   Heparin 5000    LUA AVF / L IJ cath  350/A1.5  Profile 4  Limit UF to 3 L/ treatment Hectorol 2, Aranesp 150/wk  , Venofer 50 /wk       Assessment: 1. Critical illness myopathy - rehab started 11/20 2. Afib - no A/C candidate, on MTP for rate control 3. ESRD on HD - mzx BVR 350, max UF 3kg, minimized cardiac stress and avoid hypotension . On midodrine. 4. HTN/ volume - as above, still 6-7 kg up 5. 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 DM per primary 8. Left AVF hematoma/ infiltration - shuntogram 11/4 patent access. On exam hematomas are rock hard and would preclude cannulation of the access although bruit auscultated. [facility] - will ask [facility] to see next week to assess when we might be able to use AVF 9. Cardiac arrest - 3rd episode 10/19. Med Rx for severe multivessel CAD. Full code.  10. Heme +stool - no frank bleed, s/p 2u prbc  Plan- HD today, and HD tomorrow (extra Rx for volume)    Vinson Moselleob Nekia Maxham MD  pager [phone]    cell [phone]  08/08/2014, 10:52 AM     Recent Labs Lab 08/02/14 1518 08/05/14 1544  NA 137 136*  K 4.4 4.9  CL 94* 93*  CO2 25 23  GLUCOSE 81 176*  BUN 52* 72*  CREATININE 5.09* 6.10*  CALCIUM 9.6 9.4  PHOS 2.8 2.9    Recent Labs Lab 08/02/14 1518  08/05/14 1544  ALBUMIN 3.2* 3.4*    Recent Labs Lab 08/04/14 0544 08/05/14 1544 08/06/14 0454  WBC 9.8 10.7* 10.7*  HGB 8.8* 9.1* 9.9*  HCT 27.9* 28.6* 30.8*  MCV 98.6 99.7 98.1  PLT 232 250 215   . sodium chloride   Intravenous Once  . sodium chloride   Intravenous Once  . aspirin EC  81 mg Oral Daily  . darbepoetin (ARANESP) injection - DIALYSIS  150 mcg Intravenous Q Wed-HD  . docusate sodium  100 mg Oral BID  . doxercalciferol  5 mcg Intravenous Q M,W,F-HD  . feeding supplement (NEPRO CARB STEADY)  237 mL Oral TID WC  . [START ON 08/10/2014] ferric gluconate (FERRLECIT/NULECIT) IV  62.5 mg Intravenous Q Wed-HD  . glimepiride  2 mg Oral Q breakfast  . hydrocerin   Topical BID  . insulin aspart  0-15 Units Subcutaneous TID WC  . metoCLOPramide  10 mg Oral TID AC & HS  . metoprolol tartrate  12.5 mg Oral 4 times per day on Sun Tue Thu Sat  .  midodrine  5 mg Oral Q M,W,F-HD  . multivitamin  1 tablet Oral QHS  . pantoprazole  40 mg Oral Daily  . polyethylene glycol  17 g Oral Daily  . sevelamer carbonate  800 mg Oral TID WC     acetaminophen, antiseptic oral rinse, diphenhydrAMINE, docusate sodium, ondansetron **OR** ondansetron (ZOFRAN) IV, oxyCODONE, sorbitol

## 2014-08-08 NOTE — Progress Notes (Signed)
Physical Therapy Weekly Progress Note  Patient Details  Name: Briana Gould MRN: [id number] Date of Birth: 1946/02/02  Beginning of progress report period: July 31, 2014 End of progress report period: August 08, 2014  Today's Date: 08/08/2014 PT Individual Time: 1315-1415 PT Individual Time Calculation (min): 60 min   Patient has met 2 of 5 short term goals.  She is progressing in w/c propulsion but is limited by activity tolerance. Regarding transfers, at times she has transferred with SBT to R with mod assist.  Patient continues to demonstrate the following deficits: activity tolerance, strength, learning new information, processing time, balance, prosthetic fit, functional mobility and locomotion and therefore will continue to benefit from skilled PT intervention to enhance overall performance with activity tolerance, balance, ability to compensate for deficits, functional use of  left upper extremity and left lower extremity and awareness.  Patient progressing toward long term goals..  Plan of care revisions: Several LTGs downgraded due to pt's problems with new learning, limited muscle strengthening , LLE edema preventing use of prosthetic LE. Pt demonstrates cognitive deficits requiring 24 hour supervision at home for safety.  PT Short Term Goals Week 1:  PT Short Term Goal 1 (Week 1): Pt will demonstrate supine to sit transfer with SBA PT Short Term Goal 1 - Progress (Week 1): Met PT Short Term Goal 2 (Week 1): Pt will scoot transfer with SB req mod A.  PT Short Term Goal 2 - Progress (Week 1): Partly met PT Short Term Goal 3 (Week 1): Pt will self propel manual w/c x 100' req SBA.  PT Short Term Goal 3 - Progress (Week 1): Partly met PT Short Term Goal 4 (Week 1): Pt will initiate ambulate with PT during session.  PT Short Term Goal 4 - Progress (Week 1): Not met PT Short Term Goal 5 (Week 1): Pt will demonstrate sit to supine transfer with SBA.  PT Short Term Goal 5 -  Progress (Week 1): Met  Skilled Therapeutic Interventions/Progress Updates:   Pt sitting in w/c without use of L residual limb pad legrest; no ACE wrap on LLE and no TED on RLE. R foot too edematous to fit in pt's Karle Starch style shoe' she eventually accepted that it would not fit and said she would ask family to bring a tennis shoe.   Pt stated she did not wear her prosthesis yesterday.  PT educated pt on importance of wearing prosthesis every day to help control edema and ensure fit, so that she may transfer, stand and begin ambulation. Pt donned Neoprene sleeve LLE for prosthesis with mod assist due to difficulty leaning forward, with DOE. Attempted to don prosthesis in sitting and in standing in static stander, but unable due to edema.    W/c propulsion using bil UEs and RLE wearing open back clog found in tote bag, x 70' with rest breaks PRN.    W/c> mat to R with SBT with mod assist to initiate after placing board; max assist for sequencing and head/hips relationship.   Sit>< supine slowly with supervision and min cues.  Pt performed bil glut sets in supine, 10 x 1 with isometric holds.   As pt fatigued by end of session, SBT to L required mod/max assist after placement of board.  PT spoke with RN about ACE wrap for LLE and TED for RLE.     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  Pt with 2 coats of dark nail polish on fingernails. Pain: Pain Assessment Pain Assessment: No/denies pain      See FIM for current functional status  Therapy/Group: Individual Therapy  Anysha Frappier 08/08/2014, 4:05 PM

## 2014-08-08 NOTE — Plan of Care (Signed)
Problem: RH BOWEL ELIMINATION Goal: RH STG MANAGE BOWEL WITH ASSISTANCE STG Manage Bowel with min Assistance.  Outcome: Progressing Goal: RH STG MANAGE BOWEL W/MEDICATION W/ASSISTANCE STG Manage Bowel with Medication with min Assistance.  Outcome: Progressing  Problem: RH SAFETY Goal: RH STG ADHERE TO SAFETY PRECAUTIONS W/ASSISTANCE/DEVICE STG Adhere to Safety Precautions With min Assistance/Device.  Outcome: Progressing Goal: RH STG DECREASED RISK OF FALL WITH ASSISTANCE STG Decreased Risk of Fall With min Assistance.  Outcome: Progressing     

## 2014-08-09 ENCOUNTER — Inpatient Hospital Stay (HOSPITAL_COMMUNITY): Payer: BC Managed Care – PPO | Admitting: Speech Pathology

## 2014-08-09 ENCOUNTER — Inpatient Hospital Stay (HOSPITAL_COMMUNITY): Payer: Medicare Other | Admitting: Occupational Therapy

## 2014-08-09 ENCOUNTER — Inpatient Hospital Stay (HOSPITAL_COMMUNITY): Payer: BC Managed Care – PPO | Admitting: *Deleted

## 2014-08-09 LAB — GLUCOSE, CAPILLARY
GLUCOSE-CAPILLARY: 98 mg/dL (ref 70–99)
GLUCOSE-CAPILLARY: 182 mg/dL — AB (ref 70–99)
Glucose-Capillary: 236 mg/dL — ABNORMAL HIGH (ref 70–99)
Glucose-Capillary: 202 mg/dL — ABNORMAL HIGH (ref 70–99)

## 2014-08-09 LAB — BASIC METABOLIC PANEL
Anion gap: 15 (ref 5–15)
BUN: 22 mg/dL (ref 6–23)
CO2: 26 mEq/L (ref 19–32)
Calcium: 9.4 mg/dL (ref 8.4–10.5)
Chloride: 98 mEq/L (ref 96–112)
Creatinine, Ser: 2.42 mg/dL — ABNORMAL HIGH (ref 0.50–1.10)
GFR, EST AFRICAN AMERICAN: 23 mL/min — AB (ref >90)
GFR, EST NON AFRICAN AMERICAN: 19 mL/min — AB (ref >90)
Glucose, Bld: 161 mg/dL — ABNORMAL HIGH (ref 70–99)
POTASSIUM: 3.5 meq/L — AB (ref 3.7–5.3)
Sodium: 139 mEq/L (ref 137–147)

## 2014-08-09 LAB — CBC
HEMATOCRIT: 31.2 % — AB (ref 36.0–46.0)
Hemoglobin: 10.1 g/dL — ABNORMAL LOW (ref 12.0–15.0)
MCH: 31.9 pg (ref 26.0–34.0)
MCHC: 32.4 g/dL (ref 30.0–36.0)
MCV: 98.4 fL (ref 78.0–100.0)
Platelets: 231 10*3/uL (ref 150–400)
RBC: 3.17 MIL/uL — ABNORMAL LOW (ref 3.87–5.11)
RDW: 19.9 % — AB (ref 11.5–15.5)
WBC: 10.1 10*3/uL (ref 4.0–10.5)

## 2014-08-09 MED ORDER — DOXERCALCIFEROL 4 MCG/2ML IV SOLN
INTRAVENOUS | Status: AC
  Filled 2014-08-09: qty 4

## 2014-08-09 MED ORDER — ACETAMINOPHEN 325 MG PO TABS
ORAL_TABLET | ORAL | Status: AC
  Filled 2014-08-09: qty 2

## 2014-08-09 MED ORDER — MIDODRINE HCL 5 MG PO TABS
ORAL_TABLET | ORAL | Status: AC
  Administered 2014-08-09: 5 mg via ORAL
  Filled 2014-08-09: qty 1

## 2014-08-09 NOTE — Progress Notes (Signed)
Occupational Therapy Session Note  Patient Details  Name: Briana Gould MRN: [id number] Date of Birth: 06-22-1946  Today's Date: 08/09/2014 OT Individual Time: 3419-3790 OT Individual Time Calculation (min): 60 min    Short Term Goals: Week 1:  OT Short Term Goal 1 (Week 1): Pt would be able to transfer to and from BSC/ toliet with mod A  OT Short Term Goal 1 - Progress (Week 1): Not met OT Short Term Goal 2 (Week 1): Pt would tolerate prosethesis on during transfer training OT Short Term Goal 2 - Progress (Week 1): Not met OT Short Term Goal 3 (Week 1): Pt would be able to perform toielting after BM with mod A  OT Short Term Goal 3 - Progress (Week 1): Not met OT Short Term Goal 4 (Week 1): Pt will perform UB dressing with setup  OT Short Term Goal 4 - Progress (Week 1): Met Week 2:  OT Short Term Goal 1 (Week 2): Pt will complete slide board transfer to [facility] with mod A x 1. OT Short Term Goal 2 (Week 2): Pt will be able to don pants over both legs from supine position. OT Short Term Goal 3 (Week 2): Pt will be able to don underwear and/or pants over hips with min A by rolling in bed. OT Short Term Goal 4 (Week 2): Pt will be able to lateral lean to both sides on BSC to be able to perform hygiene.  Skilled Therapeutic Interventions/Progress Updates:    Pt in bed sleeping at start of session. Pt somewhat difficult to arouse, pt stated she returned from dialysis at 2am.  Pt seen this session to focus on activity tolerance and functional mobility during her self care session. Pt needed mod A to sit to EOB to engage in UB self care with set up. She needed min A to lift R leg into bed to move back to supine. Pt worked on rolling L and R using rails. Pt encouraged to use legs/ trunk to initiate movement so she would not have to pull so hard. She needs frequent cues to use this technique. Pt able to wash a portion of her LB, but needed A with 75%. She also attempted to pull her underwear over her  hips, but had too much difficulty. Pt began to fall asleep 2x during the session. She did want to get to her w/c. Pt sat to EOB again with min-mod A and needed max A to use SB to chair. She stated the head/hip ratio for SB transfers but needed cues to utilize it. She is not able to push through her R leg effectively to elevate her hips. Pt completed grooming at the sink. Discussed her goals and what she needs to work on to progress. Encouraged pt to work on weight shifts in the w/c.  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

## 2014-08-09 NOTE — Progress Notes (Signed)
Physical Therapy Session Note  Patient Details  Name: Briana Gould MRN: [id number] Date of Birth: [birth year]  Today's Date: 08/09/2014 PT Individual Time: 1135-1205 and 14:45-15:45 (49mn)  PT Individual Time Calculation (min): 30 min   Short Term Goals: Week 1:  PT Short Term Goal 1 (Week 1): Pt will demonstrate supine to sit transfer with SBA PT Short Term Goal 1 - Progress (Week 1): Met PT Short Term Goal 2 (Week 1): Pt will scoot transfer with SB req mod A.  PT Short Term Goal 2 - Progress (Week 1): Partly met PT Short Term Goal 3 (Week 1): Pt will self propel manual w/c x 100' req SBA.  PT Short Term Goal 3 - Progress (Week 1): Partly met PT Short Term Goal 4 (Week 1): Pt will initiate ambulate with PT during session.  PT Short Term Goal 4 - Progress (Week 1): Not met PT Short Term Goal 5 (Week 1): Pt will demonstrate sit to supine transfer with SBA.  PT Short Term Goal 5 - Progress (Week 1): Met Week 2:     Skilled Therapeutic Interventions/Progress Updates:  First tx focused on functional mobility training in room. Pt exhausted from late-night dialysis, but agreeable to bedside tx. Pt's LE was wrapped by PT for compression, but unable to fit prosthesis this tx. Will reattempt in PM. Placed signs up in room to remind pt/staff to wrap LEs in morning to increase chance of donning prosthetic limb for functional use. Provided pt card with transfer steps due to decreased day-to-day carryover. Discussed, at length, the importance of pt assuming increased responsibility and initiation of mobility tasks to reduce caregiver burden and safety at home. Pt in agreement.   Pt self-directed sliding board transfer, choosing to transfer to R, and asking for assist with arm rest. PT applied WD40 to increase independence with arm rest as well as adjusted brakes for increased safety/use. Pt able to initiate proper lean for board placement today, but continued to need max A for hip advancement. Pt  required Min A for LLE lifting into bed today due to fatigue. Instructed pt in scooting techniques for bed mobility, still needing max A for scooting. Attempted bridging in bed for functional strength. Lunch arrived, so pt chose to return to sitting EOB for eating. Bed alarm on. Communicated with NT regarding need for wrapping LE.   Second tx focused on therex for activity tolerance and strengthening, functional mobility training, and WC propulsion/management. Pt was fast asleep upon first attempt, so reattempted 1 hr later, and she was agreeable and thankful for the rest. Pt needed Min A for supine>sit with bed rails and bed flat. Performed unsupported sitting balance attempting to don prosthetic sleeve. Sleeve fit, but too swollen for prosthesis. Pt able to assist more with sleeve donning today, but not completely. Wrapped with ACE rather than sleeve for increased compression.   Pt performed downhill sliding board transfer with mod A this afternoon, which boosted her confidence. Discussed DC planning home, including dialysis transport, ramp access, and possibly needed to work towards car transfers.   Pt instructed in seated LAQ, marching, and attempted WC push-ups in various UE positioning. Pt needed cues to continue after2-3 reps each due to fatigue. Pt completed ~10 each bilaterally. Pt uable to find effective position for push-ups but would benefit from continuing attempts. Unable to clear hips.   Performed scooting in WC and adjusting as well, pt able to utilize lateral leans, but not effective forward lean.   WC  propulsion performed x45' in 38mn due to fatigue. Pt needed encouragement to continue. Instructed pt in WC parts management, including operating arm rest and amputee leg rest, which she was able to do with multiple attempts and tactile cues for hand placement.   [facility].      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

## 2014-08-09 NOTE — Progress Notes (Signed)
Patient picked up for dialysis. 

## 2014-08-09 NOTE — Consult Note (Signed)
INITIAL DIAGNOSTIC EVALUATION - [facility]   MEDICAL NECESSITY:  [facility] an initial diagnostic evaluation owing to the patient's diagnosis of critical illness myopathy.   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 She received CPR for 2 minutes with return of spontaneous circulation and was unresponsive w/ ineffective ventilation and posturing on arrival to ED. She was placed under the hypothermia protocol.On 06/29/2014 she was rewarmed and extubated but remained lethargic and nonverbal with concerns of anoxic encephalopathy. CT head negative. EEG done showing diffuse cerebral disturbance--non-specific."  During today's visit, Briana Gould reported suffering from improved cognitive disturbance that she attributed to her present medical situation. Mostly she complained of memory loss that is no longer significant. From an emotional standpoint, she is in the midst of grieving her recently deceased husband. She does not feel that she has had ample time to grieve his death and she remains quite sad. She has no history of mental illness or treatment. She was interested in grief counseling. No adjustment issues endorsed. Suicidal/homicidal ideation, plan or intent was denied. No manic or hypomanic episodes were reported. The patient denied ever experiencing any auditory/visual hallucinations. No major behavioral or personality  changes were endorsed.  Briana Gould said that the majority of the rehab staff has been good. However, there is one individual that she would like to file a complaint about. She did not wish to discuss this topic and only wanted to the social worker to help her accomplish this deed. I told her that I would help to facilitate that conversation. Therapy has reportedly been going well, though she would like more of a break time in between appointments. I told her this could be accomplished. She does believe that she is making progress. No barriers to therapy identified.  Briana Gould presently lives alone but her daughter has been quite helpful. Her brother has also traveled from [place] to assist with her transition home.   PROCEDURES ADMINISTERED: [1 unit [id number] on 08/08/14] Diagnostic clinical interview  Review of available records  Behavioral Evaluation: Briana Gould was appropriately dressed for season and situation, and she appeared tidy and well-groomed. Normal posture was noted. She was friendly and rapport was easily established. Her speech was as expected and she was able to express ideas effectively. Her affect was appropriately modulated, though she was noted to be depressed and tearful at times. Attention and motivation were good.    IMPRESSION: Briana Gould reported suffering from bereavement owing to the recent loss of her husband. We discussed setting her up with grief counseling upon discharge and she was agreeable. I plan to follow-up with her throughout her admission for supportive psychotherapy. Given her present medical circumstances, and the possibility of cognitive impairment, I also recommend neuropsych testing upon discharge.    RECOMMENDATIONS  . Social worker consulted. They will follow-up about complaint as well as to provide resources for grief counseling.  . Social work can also provide my contact information for the patient as needed.  .Marland Kitchen  Will follow-up with patient next  Monday for supportive psychotherapy. Please place her on my schedule.  . Given the possibility of anoxia, cognitive testing as an outpatient would be valuable.   DIAGNOSES:  Critical illness myopathy Bereavement    Debbe MountsAdam T. Olivia Pavelko, Psy.D.  Clinical Neuropsychologist

## 2014-08-09 NOTE — Progress Notes (Signed)
Speech Language Pathology Daily Session Note  Patient Details  Name: Briana Gould MRN: [id number] Date of Birth: 12/24/1945  Today's Date: 08/09/2014 SLP Individual Time: 0920-1000 SLP Individual Time Calculation (min): 40 min  Short Term Goals: Week 2: SLP Short Term Goal 1 (Week 2): Pt will tolerate her currently prescribed diet with no overt s/s of aspiration and mod I use of swallowing precautions.  SLP Short Term Goal 2 (Week 2): Patient will utilize external memory aids to recall new, daily information with supervision multimodal cues.  SLP Short Term Goal 3 (Week 2): Patient will demonstrate functional problem solving for mildly complex tasks with supervision multimodal cues.  SLP Short Term Goal 4 (Week 2): Patient will self-monitor and correct errors during functional tasks with supervision question cues.   Skilled Therapeutic Interventions:  Pt was seen for skilled ST targeting family education and cognitive goals.  Upon initial arrival, pt reported that she had been up all night at dialysis and requested to resume therapy in 15-30 minutes so that she could finish her breakfast.  As a result, pt missed 20 minutes of skilled ST.  Upon return, pt was agreeable to participate in ST.  Pt's brother, Kathlene NovemberMike was present.  SLP provided skilled education related to current goals and progress in therapy.  SLP also recommended 24/7 supervision at discharge as well as assistance for medication and financial management. Pt's brother reports that pt is "close" to her cognitive baseline.  As a result, SLP recommends decrease in ST frequency to 3x/week.  SLP then facilitated the session with a structured therapeutic task targeting working memory and use of associations as a compensatory strategy for improved recall of daily information. Pt made associations between pictures and categories for 100% recall with min assist.  However, as task progressed pt required increased cues for recall of categories and  overall max assist for generative naming due to decreased mental flexibility and delayed processing.  Suspect that pt's difficulty during task was partly behavioral in nature and further compounded by fatigue as pt was significantly more animated and talkative with family present versus after they left.       FIM:  Comprehension Comprehension Mode: Auditory Comprehension: 5-Follows basic conversation/direction: With extra time/assistive device Expression Expression Mode: Verbal Expression: 5-Expresses basic needs/ideas: With extra time/assistive device Social Interaction Social Interaction: 4-Interacts appropriately 75 - 89% of the time - Needs redirection for appropriate language or to initiate interaction. Problem Solving Problem Solving: 3-Solves basic 50 - 74% of the time/requires cueing 25 - 49% of the time Memory Memory: 3-Recognizes or recalls 50 - 74% of the time/requires cueing 25 - 49% of the time  Pain Pain Assessment Pain Assessment: No/denies pain  Therapy/Group: Individual Therapy   Jackalyn LombardNicole Melida Northington, M.A. CCC-SLP  Leafy Motsinger, Melanee SpryNicole L 08/09/2014, 10:15 AM

## 2014-08-09 NOTE — Progress Notes (Signed)
Subjective/Complaints: Questions about eye drops, reviewed chart no indications seen no eye c/os was on prednisolone  Appreciate VVS note Pt denies other issues No eye pain or drainage or itching Review of Systems - otherwise neg Objective: Vital Signs: Blood pressure 123/69, pulse 81, temperature 98.5 F (36.9 C), temperature source Oral, resp. rate 16, weight 83.3 kg (183 lb 10.3 oz), SpO2 93 %. No results found. Results for orders placed or performed during the hospital encounter of 07/29/14 (from the past 72 hour(s))  Glucose, capillary     Status: Abnormal   Collection Time: 08/06/14 11:46 AM  Result Value Ref Range   Glucose-Capillary 122 (H) 70 - 99 mg/dL  Glucose, capillary     Status: Abnormal   Collection Time: 08/06/14  4:43 PM  Result Value Ref Range   Glucose-Capillary 208 (H) 70 - 99 mg/dL   Comment 1 Notify RN   Glucose, capillary     Status: Abnormal   Collection Time: 08/06/14  8:50 PM  Result Value Ref Range   Glucose-Capillary 172 (H) 70 - 99 mg/dL  Glucose, capillary     Status: Abnormal   Collection Time: 08/07/14  7:42 AM  Result Value Ref Range   Glucose-Capillary 154 (H) 70 - 99 mg/dL  Glucose, capillary     Status: Abnormal   Collection Time: 08/07/14 11:09 AM  Result Value Ref Range   Glucose-Capillary 132 (H) 70 - 99 mg/dL  Glucose, capillary     Status: None   Collection Time: 08/07/14  4:41 PM  Result Value Ref Range   Glucose-Capillary 79 70 - 99 mg/dL  Glucose, capillary     Status: Abnormal   Collection Time: 08/07/14  8:45 PM  Result Value Ref Range   Glucose-Capillary 172 (H) 70 - 99 mg/dL  Glucose, capillary     Status: Abnormal   Collection Time: 08/08/14  7:06 AM  Result Value Ref Range   Glucose-Capillary 167 (H) 70 - 99 mg/dL  Glucose, capillary     Status: Abnormal   Collection Time: 08/08/14 11:55 AM  Result Value Ref Range   Glucose-Capillary 194 (H) 70 - 99 mg/dL  Glucose, capillary     Status: Abnormal   Collection Time:  08/08/14  4:17 PM  Result Value Ref Range   Glucose-Capillary 262 (H) 70 - 99 mg/dL  Glucose, capillary     Status: None   Collection Time: 08/08/14  9:19 PM  Result Value Ref Range   Glucose-Capillary 89 70 - 99 mg/dL  Renal function panel     Status: Abnormal   Collection Time: 08/08/14 10:25 PM  Result Value Ref Range   Sodium 132 (L) 137 - 147 mEq/L   Potassium 4.8 3.7 - 5.3 mEq/L   Chloride 89 (L) 96 - 112 mEq/L   CO2 23 19 - 32 mEq/L   Glucose, Bld 82 70 - 99 mg/dL   BUN 73 (H) 6 - 23 mg/dL   Creatinine, Ser 6.54 (H) 0.50 - 1.10 mg/dL   Calcium 9.6 8.4 - 10.5 mg/dL   Phosphorus 3.7 2.3 - 4.6 mg/dL   Albumin 3.3 (L) 3.5 - 5.2 g/dL   GFR calc non Af Amer 6 (L) >90 mL/min   GFR calc Af Amer 7 (L) >90 mL/min    Comment: (NOTE) The eGFR has been calculated using the CKD EPI equation. This calculation has not been validated in all clinical situations. eGFR's persistently <90 mL/min signify possible Chronic Kidney Disease.    Anion gap  20 (H) 5 - 15  CBC     Status: Abnormal   Collection Time: 08/08/14 10:25 PM  Result Value Ref Range   WBC 11.4 (H) 4.0 - 10.5 K/uL   RBC 3.21 (L) 3.87 - 5.11 MIL/uL   Hemoglobin 9.9 (L) 12.0 - 15.0 g/dL   HCT 30.6 (L) 36.0 - 46.0 %   MCV 95.3 78.0 - 100.0 fL   MCH 30.8 26.0 - 34.0 pg   MCHC 32.4 30.0 - 36.0 g/dL   RDW 19.7 (H) 11.5 - 15.5 %   Platelets 248 150 - 400 K/uL  Glucose, capillary     Status: None   Collection Time: 08/09/14  6:38 AM  Result Value Ref Range   Glucose-Capillary 98 70 - 99 mg/dL     HEENT: normal, eyes non injected, no drainage Cardio: RRR and no murmur Resp: CTA B/L and unlabored GI: BS positive and NT, ND Extremity:  Pulses positive and trace LE Edema Skin:   Intact Neuro: Alert/Oriented. Improved trunk control Musc/Skel:  Swelling Left arm, with good thrill at AV graft site--minimal pain Gen NAD   Assessment/Plan: 1. Functional deficits secondary to Critical illness myopathy which require 3+ hours  per day of interdisciplinary therapy in a comprehensive inpatient rehab setting. Physiatrist is providing close team supervision and 24 hour management of active medical problems listed below. Physiatrist and rehab team continue to assess barriers to discharge/monitor patient progress toward functional and medical goals.  FIM: FIM - Bathing Bathing Steps Patient Completed: Chest, Right Arm, Left Arm, Abdomen, Right upper leg, Left upper leg, Front perineal area Bathing: 3: Mod-Patient completes 5-7 5f10 parts or 50-74%  FIM - Upper Body Dressing/Undressing Upper body dressing/undressing steps patient completed: Thread/unthread right bra strap, Thread/unthread left bra strap, Thread/unthread right sleeve of pullover shirt/dresss, Thread/unthread left sleeve of pullover shirt/dress, Put head through opening of pull over shirt/dress Upper body dressing/undressing: 3: Mod-Patient completed 50-74% of tasks FIM - Lower Body Dressing/Undressing Lower body dressing/undressing steps patient completed: Thread/unthread left underwear leg Lower body dressing/undressing: 1: Total-Patient completed less than 25% of tasks  FIM - Toileting Toileting steps completed by patient: Performs perineal hygiene Toileting: 0: Activity did not occur  FIM - TRadio producerDevices: Bedside commode, Sliding board Toilet Transfers: 2-To toilet/BSC: Max A (lift and lower assist)  FIM - BControl and instrumentation engineerDevices: Sliding board Bed/Chair Transfer: 2: Bed > Chair or W/C: Max A (lift and lower assist)  FIM - Locomotion: Wheelchair Distance: 40 Locomotion: Wheelchair: 0: Activity did not occur FIM - Locomotion: Ambulation Ambulation/Gait Assistance: Not tested (comment) Locomotion: Ambulation: 0: Activity did not occur  Comprehension Comprehension Mode: Auditory Comprehension: 5-Follows basic conversation/direction: With extra time/assistive  device  Expression Expression Mode: Verbal Expression: 5-Expresses basic needs/ideas: With extra time/assistive device  Social Interaction Social Interaction: 5-Interacts appropriately 90% of the time - Needs monitoring or encouragement for participation or interaction.  Problem Solving Problem Solving: 5-Solves complex 90% of the time/cues < 10% of the time  Memory Memory: 6-More than reasonable amt of time  Medical Problem List and Plan: 1. Functional deficits secondary to Critical illness myopathy.   2.  DVT Prophylaxis/Anticoagulation: Pharmaceutical:was on Lovenox, pt refusing  use SCDs 3. Pain Management: Prn oxycodone.   4. Mood: LCSW to follow for evaluation and support. Team to provide egosupport as well.   5. Neuropsych: This patient is capable of making decisions on her own behalf. 6. Skin/Wound Care: continue foam dressing to  sacrum. Order Prevalon boot for right foot as well as elevation when in bed.   7. Fluids/Electrolytes/Nutrition: Strict I/O. 1200 FR due to ESRD. 8. ESRD: HD after therapy day. Nephrology to manage dialysis,               -AVG site minimally tender  9. CAD: metoprolol, ASA, monitor clinically with functional activities 10. A fib: rate controlled at present. Continue metoprolol   12. DM type II: sliding scale insulin. Check cbg's ac and hs             -on amaryl at home,  resumed for CBGs above 200---follow today 13.  Heme + stool, microscopic, monitor for frank red blood, likely small anal fissure or hemorrhoid plus lovenox, will monitor, transfused in HD  -serial H/H's (9.9 last value improving) 14.  Visual blurring, I do not see an indication for prednisolone gtt, will d/c and observe LOS (Days) 11 A FACE TO FACE EVALUATION WAS PERFORMED  Briana Gould 08/09/2014, 8:22 AM

## 2014-08-10 ENCOUNTER — Inpatient Hospital Stay (HOSPITAL_COMMUNITY): Payer: Medicare Other

## 2014-08-10 ENCOUNTER — Inpatient Hospital Stay (HOSPITAL_COMMUNITY): Payer: Medicare Other | Admitting: Occupational Therapy

## 2014-08-10 DIAGNOSIS — I6782 Cerebral ischemia: Secondary | ICD-10-CM

## 2014-08-10 DIAGNOSIS — G931 Anoxic brain damage, not elsewhere classified: Secondary | ICD-10-CM

## 2014-08-10 DIAGNOSIS — Z01810 Encounter for preprocedural cardiovascular examination: Secondary | ICD-10-CM

## 2014-08-10 LAB — BASIC METABOLIC PANEL
Anion gap: 17 — ABNORMAL HIGH (ref 5–15)
BUN: 36 mg/dL — AB (ref 6–23)
CHLORIDE: 97 meq/L (ref 96–112)
CO2: 25 mEq/L (ref 19–32)
Calcium: 9.5 mg/dL (ref 8.4–10.5)
Creatinine, Ser: 4.03 mg/dL — ABNORMAL HIGH (ref 0.50–1.10)
GFR calc Af Amer: 12 mL/min — ABNORMAL LOW (ref >90)
GFR, EST NON AFRICAN AMERICAN: 10 mL/min — AB (ref >90)
Glucose, Bld: 174 mg/dL — ABNORMAL HIGH (ref 70–99)
Potassium: 4 mEq/L (ref 3.7–5.3)
Sodium: 139 mEq/L (ref 137–147)

## 2014-08-10 LAB — GLUCOSE, CAPILLARY
GLUCOSE-CAPILLARY: 180 mg/dL — AB (ref 70–99)
GLUCOSE-CAPILLARY: 92 mg/dL (ref 70–99)
Glucose-Capillary: 100 mg/dL — ABNORMAL HIGH (ref 70–99)
Glucose-Capillary: 204 mg/dL — ABNORMAL HIGH (ref 70–99)

## 2014-08-10 MED ORDER — MIDODRINE HCL 5 MG PO TABS
ORAL_TABLET | ORAL | Status: AC
  Administered 2014-08-10: 5 mg via ORAL
  Filled 2014-08-10: qty 1

## 2014-08-10 MED ORDER — DARBEPOETIN ALFA 150 MCG/0.3ML IJ SOSY
PREFILLED_SYRINGE | INTRAMUSCULAR | Status: AC
  Administered 2014-08-10: 150 ug via INTRAVENOUS
  Filled 2014-08-10: qty 0.3

## 2014-08-10 MED ORDER — DOXERCALCIFEROL 4 MCG/2ML IV SOLN
INTRAVENOUS | Status: AC
  Filled 2014-08-10: qty 4

## 2014-08-10 NOTE — Progress Notes (Signed)
Occupational Therapy Session Note  Patient Details  Name: Briana Gould MRN: [id number] Date of Birth: 07/08/1946  Today's Date: 08/10/2014 OT Individual Time: 0800-0900 OT Individual Time Calculation (min): 60 min    Short Term Goals: Week 1:  OT Short Term Goal 1 (Week 1): Pt would be able to transfer to and from BSC/ toliet with mod A  OT Short Term Goal 1 - Progress (Week 1): Not met OT Short Term Goal 2 (Week 1): Pt would tolerate prosethesis on during transfer training OT Short Term Goal 2 - Progress (Week 1): Not met OT Short Term Goal 3 (Week 1): Pt would be able to perform toielting after BM with mod A  OT Short Term Goal 3 - Progress (Week 1): Not met OT Short Term Goal 4 (Week 1): Pt will perform UB dressing with setup  OT Short Term Goal 4 - Progress (Week 1): Met Week 2:  OT Short Term Goal 1 (Week 2): Pt will complete slide board transfer to [facility] with mod A x 1. OT Short Term Goal 2 (Week 2): Pt will be able to don pants over both legs from supine position. OT Short Term Goal 3 (Week 2): Pt will be able to don underwear and/or pants over hips with min A by rolling in bed. OT Short Term Goal 4 (Week 2): Pt will be able to lateral lean to both sides on BSC to be able to perform hygiene.  Skilled Therapeutic Interventions/Progress Updates:    Pt seen for BADL retraining of bathing and dressing from bed level with a focus on activity tolerance and functional mobility. Pt's nurse requested pt remain in bed as she has an upcoming test.  Pt sitting at EOB finishing breakfast. She completed UB self care from EOB with s/u but extra time needed due to fatigue. Attempted lateral leans in bed to wash buttocks, but pt was not able to lean far enough for through cleansing. Pt moved back into supine position to cleanse her front area, tops of legs, part of her bottom using rolling. Pt needed min cues with rolling to effectively move her body. Pt needed assist to place R foot in underwear and  skirt and then she pulled over left leg but continues to need total A to adjust clothing over hips.  ACE wrap applied to L residual limb and Ted hose on R leg.  Pt sat to EOB again to work on scooting to the R to move her hips closer to pillow.  Therapist used bed pad to assist with guiding her hips forward and used Bobath technique to encourage a large forward lean. Pt needed max A with scooting and then was able to lift R leg into bed to be adjusted in supine. Pt left in Bed in chair position with call light and phone in reach.   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

## 2014-08-10 NOTE — Progress Notes (Signed)
Social Work Lucy Chrisebecca G Lorenzo Pereyra, LCSW Social Worker Signed  Patient Care Conference 08/10/2014  2:07 PM    Expand All Collapse All   Inpatient RehabilitationTeam Conference and Plan of Care Update Date: 08/10/2014   Time: 10;55 AM     Patient Name: Briana Gould       Medical Record Number: [id number]  Date of Birth: 1946-03-16 Sex: Female         Room/Bed: 4U98J/1B14N-824W04C/4W04C-01 Payor Info: Payor: MEDICARE / Plan: MEDICARE PART A AND B / Product Type: *No Product type* /    Admitting Diagnosis: Critical illness Myopathy  deconditioning   Admit Date/Time:  07/29/2014  4:46 PM Admission Comments: No comment available   Primary Diagnosis:  Critical illness myopathy Principal Problem: Critical illness myopathy    Patient Active Problem List     Diagnosis  Date Noted   .  Mild anoxic-ischemic encephalopathy  08/10/2014   .  Critical illness myopathy  07/29/2014   .  HCAP (healthcare-associated pneumonia)  12/12/2013   .  Physical deconditioning  10/15/2013   .  CVA (cerebral infarction)  10/01/2013   .  Paroxysmal a-fib  10/01/2013   .  Cardiac arrest  09/20/2013   .  Acute respiratory failure with hypoxia  09/20/2013   .  Hypertension  09/20/2013   .  Encephalopathy acute  09/20/2013   .  Bradycardia- intol to beta blocker  09/03/2013   .  Anemia of chronic renal failure  08/30/2013   .  Secondary hyperparathyroidism  08/30/2013   .  GIB (gastrointestinal bleeding)--recurrent- not a candidate for Plavix  08/29/2013   .  CAD- severe 3V CAD at cath March 2015- medical Rx  08/16/2013   .  History of syncope  08/16/2013   .  Aortic stenosis  08/16/2013   .  R Jugular vein thrombosis  08/15/2013   .  Cerebrovascular disease  08/15/2013   .  Thrombocytopenia  08/14/2013   .  Seizure  08/13/2013   .  PAD (peripheral artery disease)  08/13/2013   .  Weakness generalized  11/08/2011   .  ESRD (end stage renal disease) on dialysis  11/04/2011   .  MITRAL REGURGITATION  10/04/2010   .  AORTIC  STENOSIS  10/04/2010   .  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  HYPERLIPIDEMIA  09/13/2010   .  OBESITY  09/13/2010   .  Essential hypertension  09/13/2010   .  PULMONARY HYPERTENSION  09/13/2010   .  S/P unilateral BKA (below knee amputation)  09/13/2010     Expected Discharge Date: Expected Discharge Date: 08/16/14  Team Members Present: Physician leading conference: Dr. Claudette LawsAndrew Kirsteins Social Worker Present: Dossie DerBecky Camron Essman, LCSW Nurse Present: Carmie EndAngie Joyce, RN PT Present: Wanda Plumparoline Cook, PT;Blair Hobble, PT SLP Present: Jackalyn LombardNicole Page, SLP PPS Coordinator present : Tora DuckMarie Noel, RN, CRRN        Current Status/Progress  Goal  Weekly Team Focus   Medical     Therapy tolerance improving, end-stage renal on dialysis   Home discharge  Consider increasing to full  therapy schedule    Bowel/Bladder     Continent of bowel, LBM 08/10/14-patient performed manual disimpaction with assist of nurse, anuric  Continent of bowel  Offer bathroom PRN, assess for signs and symptoms of constipation   Swallow/Nutrition/ Hydration     Regular solids, thin liquids; upgraded to intermittent supervision   Mod I   continued diet tolerance  ADL's     no change from last week - max A with Slide board, mod bathing, total LB dressing, s/u UB self care  min A overall (LTGs modified from mod I last week, may need to be downgraded a 2nd time)  ADL retraining, functional mobility, transfers, UE strengthening, pt/family education, cognitive remediation   Mobility     S/min A bed mobility, Max A sliding board transfers, S WC propulsion x45,'   S bed mobility, Min A basic transfers, Mod A car/furniture transfers, S WC x100'  LE compression for prosthetic fit (anything dialysis can do to help with removing more fluid?), strengthening, activity tolerance, independence with transfers and [facility] management, family training?    Communication       na         Safety/Cognition/ Behavioral Observations     min assist   supervision   continue targeting awareness, functional problem solving, memory    Pain     Denies  </=3  Assess for pain qshift and PRN   Skin     Stage I to right heel-boggy, allevyn and prevalon boot in use, healed stage II to sacrum  No new skin breakdown  Pressure relief to buttocks and right heel       *See Care Plan and progress notes for long and short-term goals.    Barriers to Discharge:  See above     Possible Resolutions to Barriers:   See above     Discharge Planning/Teaching Needs:   Will need to begin family education with caregivers this week.  Pt plans to go home with the assistance fo various family members        Team Discussion:    Need to begin family education with caregivers-pt will require physical care. Vein mapping today-brother here a short time. Anemia improving.  Neuro-psych seeing and providing supportive counseling. Stage 2 on sacrum-RN addressing.   Revisions to Treatment Plan:    Downgraded goals to min level    Continued Need for Acute Rehabilitation Level of Care: The patient requires daily medical management by a physician with specialized training in physical medicine and rehabilitation for the following conditions: Daily direction of a multidisciplinary physical rehabilitation program to ensure safe treatment while eliciting the highest outcome that is of practical value to the patient.: Yes Daily medical management of patient stability for increased activity during participation in an intensive rehabilitation regime.: Yes Daily analysis of laboratory values and/or radiology reports with any subsequent need for medication adjustment of medical intervention for : Other  Lucy ChrisDupree, Quisha Mabie G 08/10/2014, 3:17 PM                 Lucy Chrisebecca G Deonta Bomberger, LCSW Social Worker Signed  Patient Care Conference 08/03/2014  1:36 PM    Expand All Collapse All   Inpatient RehabilitationTeam Conference and Plan of Care Update Date: 08/03/2014   Time:  10;45 AM     Patient Name: Briana Gould       Medical Record Number: [id number]  Date of Birth: 1946-06-04 Sex: Female         Room/Bed: 4W04C/4W04C-01 Payor Info: Payor: MEDICARE / Plan: MEDICARE PART A AND B / Product Type: *No Product type* /    Admitting Diagnosis: Critical illness Myopathy  deconditioning   Admit Date/Time:  07/29/2014  4:46 PM Admission Comments: No comment available   Primary Diagnosis:  Critical illness myopathy Principal Problem: Critical illness myopathy    Patient Active Problem List  Diagnosis  Date Noted   .  Critical illness myopathy  07/29/2014   .  HCAP (healthcare-associated pneumonia)  12/12/2013   .  Physical deconditioning  10/15/2013   .  CVA (cerebral infarction)  10/01/2013   .  Paroxysmal a-fib  10/01/2013   .  Cardiac arrest  09/20/2013   .  Acute respiratory failure with hypoxia  09/20/2013   .  Hypertension  09/20/2013   .  Encephalopathy acute  09/20/2013   .  Bradycardia- intol to beta blocker  09/03/2013   .  Anemia of chronic renal failure  08/30/2013   .  Secondary hyperparathyroidism  08/30/2013   .  GIB (gastrointestinal bleeding)--recurrent- not a candidate for Plavix  08/29/2013   .  CAD- severe 3V CAD at cath March 2015- medical Rx  08/16/2013   .  History of syncope  08/16/2013   .  Aortic stenosis  08/16/2013   .  R Jugular vein thrombosis  08/15/2013   .  Cerebrovascular disease  08/15/2013   .  Thrombocytopenia  08/14/2013   .  Seizure  08/13/2013   .  PAD (peripheral artery disease)  08/13/2013   .  Weakness generalized  11/08/2011   .  ESRD (end stage renal disease) on dialysis  11/04/2011   .  MITRAL REGURGITATION  10/04/2010   .  AORTIC STENOSIS  10/04/2010   .  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  HYPERLIPIDEMIA  09/13/2010   .  OBESITY  09/13/2010   .  Essential hypertension  09/13/2010   .  PULMONARY HYPERTENSION  09/13/2010   .  Status post below knee amputation  09/13/2010      Expected Discharge Date: Expected Discharge Date: 08/16/14  Team Members Present: Physician leading conference: Dr. Claudette Laws Social Worker Present: Dossie Der, LCSW Nurse Present: Carmie End, RN PT Present: Edman Circle, PT;Caroline Adriana Simas, PT;Blair Hobble, PT OT Present: Pleas Patricia, OT SLP Present: Jackalyn Lombard, SLP PPS Coordinator present : Tora Duck, RN, CRRN        Current Status/Progress  Goal  Weekly Team Focus   Medical     Poor therapy tolerance,  anemia, end-stage renal on dialysis  Improve endurance  Decreased therapy to 15/7   Bowel/Bladder     Continent of bowel, anuric  Continent of bowel with min assist   assesss for s/s of constipation q shift    Swallow/Nutrition/ Hydration     Regular solids, thin liquids; upgraded to intermittent supervision   Mod I  continued diet tolerance    ADL's     Pt unable to come into a stand even with total A x 2, total A toileting, max A to BSC with Slide board,  total A LB dressing, mod A bathing; s/u for UB self care  min A overall  ADL retraining, functional mobility, transfers, UE strengthening, pt/family education, cognitive remediation   Mobility     S bed mobility, Max A sliding board transfers, +2 sit<>stand, Min A short dist WC, no gait/stairs  Mod I bed mobility,  transfers, short distance gait, and WC prop x150'  strengthening, activity tolerance, transfer training, pre-gait, WC prop   Communication               Safety/Cognition/ Behavioral Observations    min assist   supervision   awareness, functional problem solving, memory    Pain     Patient reports occcasional headache relieved with tylenol  Pain less than or equal to 3 on a scale of 0-10  Assess for pain q4 hours and prn   Skin     Stage II to sacrum (healing) DTI to R heel (Prevalon boot in bed)  No new skin breakdown/injury, continued healing of current skin issues  Assess skin q shift and prn      *See Care Plan and progress notes for  long and short-term goals.    Barriers to Discharge:  The above     Possible Resolutions to Barriers:   See above     Discharge Planning/Teaching Needs:   Home with daughter who is there in the evenings, pt will need to be mod/i level to return home.        Team Discussion:    Pt had blood transfusion yesterday and is not sleeping well.  15/7 poor endurance-have neuro-psych see. Need to confirm caregiver for home-if goals not upgraded.   Revisions to Treatment Plan:    Goals downgraded to min assist level    Continued Need for Acute Rehabilitation Level of Care: The patient requires daily medical management by a physician with specialized training in physical medicine and rehabilitation for the following conditions: Daily direction of a multidisciplinary physical rehabilitation program to ensure safe treatment while eliciting the highest outcome that is of practical value to the patient.: Yes Daily medical management of patient stability for increased activity during participation in an intensive rehabilitation regime.: Yes Daily analysis of laboratory values and/or radiology reports with any subsequent need for medication adjustment of medical intervention for : Other  Lucy Chris 08/03/2014, 2:34 PM                  Patient ID: Chika S Straker, female   DOB: [birth year], 68 y.o.   MRN: [id number]

## 2014-08-10 NOTE — Progress Notes (Addendum)
Physical Therapy Session Note  Patient Details  Name: Briana Gould MRN: [id number] Date of Birth: September 15, 1945  Today's Date: 08/10/2014 PT Individual Time: 1005-1030; 1115-1200; 4098-1191; 1600-1625 PT Individual Time Calculation (min): 25 min , 45 min, 25 min  Short Term Goals: Week 2:  PT Short Term Goal 1 (Week 2): = LTGs  Skilled Therapeutic Interventions/Progress Updates:  Tx 1:  Brother Casimiro NeedleMichael and daughter Corrie DandyMary here and observed tx. PT requested family measure pt's bed height, car seat height, bring shrinker socks from home.  Donned Neoprene sleeve with max assist , 3 tries, and attempted donning prosthesis, in sitting, high sitting, and partial standing, but it would not click in.  Edema did not seem to be the limiting factor today. Pt has poor motor planning regarding positioning herself to don the sleeve.   SBT bed> w/c to L with mod/max assist to initiate after set up.  Pt left sitting up with family present.  Tx 2:  Family not present for second session.  W/c propulsion using bil UEs and RLE x 100' with supervision, with several brief rest breaks.  Sit> stand in // with +2 assist > +1 max assist, x 15 seconds, 45 seconds, 3 seconds.  Max cues for scooting forward/backward in w/c, wt shift, pulling up with bil hands.  Standing endurance limited by cardiopulmonary endurance; DOE.  PT donned Neoprene sleeve and attempted to don prosthesis, but bolt still will not insert into socket.  PT called [facility] and requested visit by prosthetist to assist with donning prosthesis.  Discussed with NT and RN that Neoprene sleeve should be removed before pt gets back into bed; added info on form posted above pt's bed.  Tx 3:  Scarlette SliceChris Roura from [facility] here.  He instructed pt in propping prosthesis up on footstool so that she can bear weight through heel only as she dons prosthesis.  Footstool left in pt's room.  Chris left a shrinker sock which pt should wear when not wearing  prosthesis. NT educated about shrinker sock.  Sit> stand with RW at EOB, max assist; stand step transfer to w/c to L with mod assist for stand> sit control.     Therapy Documentation Precautions:  Precautions Precautions: Fall Precaution Comments: old left BKA Restrictions Weight Bearing Restrictions: No Other Position/Activity Restrictions: old L BKA Pain: Pain Assessment Pain Assessment: No/denies pain    See FIM for current functional status  Therapy/Group: Individual Therapy  Akayla Brass 08/10/2014, 10:40 AM

## 2014-08-10 NOTE — Progress Notes (Signed)
Patient arrived from HD via her bed at 2125 without complication. Denied pain, discomfort. Fluid removed, per report - 2800 cc. Patient consumed 75% of her dinner; is now resting quietly. Coy Rochford A. Azaiah Licciardi, RN

## 2014-08-10 NOTE — Progress Notes (Signed)
Social Work [name], female   DOB: 07/08/1946, 68 y.o.   MRN: [id number] Met with pt and spoke with daughter-Dana to discuss team conference goals-min level and discharge 12/8.  We need to schedule family education with the caregivers who will be providing care. Plan for Friday from 9;00-12;00 with daughter and pt's brother. Pt and daughter are in agreement with the plans.  See how family education goes.

## 2014-08-10 NOTE — Progress Notes (Signed)
VASCULAR LAB PRELIMINARY  PRELIMINARY  PRELIMINARY  PRELIMINARY  Right  Upper Extremity Vein Map    Cephalic  Segment Diameter Depth Comment  1. Axilla 1.1396mm mm   2 Prox upper arm 2.6803mm mm   3. Above AC mm mm Too small to visualize  4. In AC 2.6021mm mm   5. Below AC mm mm Too small to visualize  6. Mid forearm mm mm   7. Wrist mm mm    mm mm    mm mm    mm mm    Basilic  Segment Diameter Depth Comment  1. Axilla mm mm   2. Mid upper arm 5.3278mm 11.669mm   3. Above [facility] 3.287mm 10.13mm branch  4. In AC 7.7128mm mm Branch, tortuous  5. Below AC 3.2129mm 8.1297mm   6. Mid forearm 3.847mm mm   7. Wrist 2.4625mm mm    mm mm    mm mm    mm mm     Farrel DemarkJill Eunice, RDMS, RVT  08/10/2014, 9:56 AM

## 2014-08-10 NOTE — Progress Notes (Addendum)
 Subjective/Complaints: Mild blurring of vision but improving No eye pain or drainage or itching Therapy noting long response latency, poor carryover in therapy, Neuropsych note reviewed, pt reports good motivation but this is not observed in PT/OT Review of Systems - otherwise neg Objective: Vital Signs: Blood pressure 139/57, pulse 78, temperature 99.7 F (37.6 C), temperature source Oral, resp. rate 18, weight 79.742 kg (175 lb 12.8 oz), SpO2 97 %. No results found. Results for orders placed or performed during the hospital encounter of 07/29/14 (from the past 72 hour(s))  Glucose, capillary     Status: Abnormal   Collection Time: 08/07/14 11:09 AM  Result Value Ref Range   Glucose-Capillary 132 (H) 70 - 99 mg/dL  Glucose, capillary     Status: None   Collection Time: 08/07/14  4:41 PM  Result Value Ref Range   Glucose-Capillary 79 70 - 99 mg/dL  Glucose, capillary     Status: Abnormal   Collection Time: 08/07/14  8:45 PM  Result Value Ref Range   Glucose-Capillary 172 (H) 70 - 99 mg/dL  Glucose, capillary     Status: Abnormal   Collection Time: 08/08/14  7:06 AM  Result Value Ref Range   Glucose-Capillary 167 (H) 70 - 99 mg/dL  Glucose, capillary     Status: Abnormal   Collection Time: 08/08/14 11:55 AM  Result Value Ref Range   Glucose-Capillary 194 (H) 70 - 99 mg/dL  Glucose, capillary     Status: Abnormal   Collection Time: 08/08/14  4:17 PM  Result Value Ref Range   Glucose-Capillary 262 (H) 70 - 99 mg/dL  Glucose, capillary     Status: None   Collection Time: 08/08/14  9:19 PM  Result Value Ref Range   Glucose-Capillary 89 70 - 99 mg/dL  Renal function panel     Status: Abnormal   Collection Time: 08/08/14 10:25 PM  Result Value Ref Range   Sodium 132 (L) 137 - 147 mEq/L   Potassium 4.8 3.7 - 5.3 mEq/L   Chloride 89 (L) 96 - 112 mEq/L   CO2 23 19 - 32 mEq/L   Glucose, Bld 82 70 - 99 mg/dL   BUN 73 (H) 6 - 23 mg/dL   Creatinine, Ser 6.54 (H) 0.50 - 1.10 mg/dL    Calcium 9.6 8.4 - 10.5 mg/dL   Phosphorus 3.7 2.3 - 4.6 mg/dL   Albumin 3.3 (L) 3.5 - 5.2 g/dL   GFR calc non Af Amer 6 (L) >90 mL/min   GFR calc Af Amer 7 (L) >90 mL/min    Comment: (NOTE) The eGFR has been calculated using the CKD EPI equation. This calculation has not been validated in all clinical situations. eGFR's persistently <90 mL/min signify possible Chronic Kidney Disease.    Anion gap 20 (H) 5 - 15  CBC     Status: Abnormal   Collection Time: 08/08/14 10:25 PM  Result Value Ref Range   WBC 11.4 (H) 4.0 - 10.5 K/uL   RBC 3.21 (L) 3.87 - 5.11 MIL/uL   Hemoglobin 9.9 (L) 12.0 - 15.0 g/dL   HCT 30.6 (L) 36.0 - 46.0 %   MCV 95.3 78.0 - 100.0 fL   MCH 30.8 26.0 - 34.0 pg   MCHC 32.4 30.0 - 36.0 g/dL   RDW 19.7 (H) 11.5 - 15.5 %   Platelets 248 150 - 400 K/uL  Glucose, capillary     Status: None   Collection Time: 08/09/14  6:38 AM  Result Value Ref Range     Glucose-Capillary 98 70 - 99 mg/dL  Glucose, capillary     Status: Abnormal   Collection Time: 08/09/14 12:08 PM  Result Value Ref Range   Glucose-Capillary 236 (H) 70 - 99 mg/dL  Glucose, capillary     Status: Abnormal   Collection Time: 08/09/14  4:26 PM  Result Value Ref Range   Glucose-Capillary 182 (H) 70 - 99 mg/dL   Comment 1 Notify RN   CBC     Status: Abnormal   Collection Time: 08/09/14  7:18 PM  Result Value Ref Range   WBC 10.1 4.0 - 10.5 K/uL   RBC 3.17 (L) 3.87 - 5.11 MIL/uL   Hemoglobin 10.1 (L) 12.0 - 15.0 g/dL   HCT 31.2 (L) 36.0 - 46.0 %   MCV 98.4 78.0 - 100.0 fL   MCH 31.9 26.0 - 34.0 pg   MCHC 32.4 30.0 - 36.0 g/dL   RDW 19.9 (H) 11.5 - 15.5 %   Platelets 231 150 - 400 K/uL  Basic metabolic panel     Status: Abnormal   Collection Time: 08/09/14  7:18 PM  Result Value Ref Range   Sodium 139 137 - 147 mEq/L   Potassium 3.5 (L) 3.7 - 5.3 mEq/L   Chloride 98 96 - 112 mEq/L   CO2 26 19 - 32 mEq/L   Glucose, Bld 161 (H) 70 - 99 mg/dL   BUN 22 6 - 23 mg/dL    Comment: DELTA CHECK  NOTED DIALYSIS    Creatinine, Ser 2.42 (H) 0.50 - 1.10 mg/dL    Comment: DELTA CHECK NOTED DIALYSIS    Calcium 9.4 8.4 - 10.5 mg/dL   GFR calc non Af Amer 19 (L) >90 mL/min   GFR calc Af Amer 23 (L) >90 mL/min    Comment: (NOTE) The eGFR has been calculated using the CKD EPI equation. This calculation has not been validated in all clinical situations. eGFR's persistently <90 mL/min signify possible Chronic Kidney Disease.    Anion gap 15 5 - 15  Glucose, capillary     Status: Abnormal   Collection Time: 08/09/14  9:32 PM  Result Value Ref Range   Glucose-Capillary 202 (H) 70 - 99 mg/dL  Glucose, capillary     Status: Abnormal   Collection Time: 08/10/14  6:50 AM  Result Value Ref Range   Glucose-Capillary 180 (H) 70 - 99 mg/dL     HEENT: normal, eyes non injected, no drainage Cardio: RRR and no murmur Resp: CTA B/L and unlabored GI: BS positive and NT, ND Extremity:  Pulses positive and trace LE Edema Skin:   Intact Neuro: Alert/Oriented. Improved trunk control Musc/Skel:  Swelling Left arm, with good thrill at AV graft site--minimal pain Gen NAD   Assessment/Plan: 1. Functional deficits secondary to Critical illness myopathy anoxic encepholopathy which require 3+ hours per day of interdisciplinary therapy in a comprehensive inpatient rehab setting. Physiatrist is providing close team supervision and 24 hour management of active medical problems listed below. Physiatrist and rehab team continue to assess barriers to discharge/monitor patient progress toward functional and medical goals. Team conference today please see physician documentation under team conference tab, met with team face-to-face to discuss problems,progress, and goals. Formulized individual treatment plan based on medical history, underlying problem and comorbidities. FIM: FIM - Bathing Bathing Steps Patient Completed: Chest, Right Arm, Left Arm, Abdomen, Right upper leg, Left upper leg, Front perineal  area Bathing: 3: Mod-Patient completes 5-7 47f10 parts or 50-74%  FIM - Upper Body Dressing/Undressing  Upper body dressing/undressing steps patient completed: Thread/unthread right sleeve of pullover shirt/dresss, Thread/unthread left sleeve of pullover shirt/dress, Put head through opening of pull over shirt/dress Upper body dressing/undressing: 5: Supervision: Safety issues/verbal cues FIM - Lower Body Dressing/Undressing Lower body dressing/undressing steps patient completed: Thread/unthread left underwear leg Lower body dressing/undressing: 1: Total-Patient completed less than 25% of tasks  FIM - Toileting Toileting steps completed by patient: Performs perineal hygiene Toileting: 0: Activity did not occur  FIM - Toilet Transfers Toilet Transfers Assistive Devices: Bedside commode, Sliding board Toilet Transfers: 2-To toilet/BSC: Max A (lift and lower assist)  FIM - Bed/Chair Transfer Bed/Chair Transfer Assistive Devices: Sliding board, Arm rests, Bed rails Bed/Chair Transfer: 4: Sit > Supine: Min A (steadying pt. > 75%/lift 1 leg), 2: Bed > Chair or W/C: Max A (lift and lower assist), 3: Chair or W/C > Bed: Mod A (lift or lower assist) (Chair>bed downhill transfer)  FIM - Locomotion: Wheelchair Distance: 45 Locomotion: Wheelchair: 1: Travels less than 50 ft with supervision, cueing or coaxing FIM - Locomotion: Ambulation Ambulation/Gait Assistance: Not tested (comment) Locomotion: Ambulation: 0: Activity did not occur  Comprehension Comprehension Mode: Auditory Comprehension: 5-Follows basic conversation/direction: With extra time/assistive device  Expression Expression Mode: Verbal Expression: 5-Expresses basic needs/ideas: With extra time/assistive device  Social Interaction Social Interaction: 5-Interacts appropriately 90% of the time - Needs monitoring or encouragement for participation or interaction.  Problem Solving Problem Solving: 5-Solves complex 90% of the  time/cues < 10% of the time  Memory Memory: 6-More than reasonable amt of time  Medical Problem List and Plan: 1. 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   2.  DVT Prophylaxis/Anticoagulation: Pharmaceutical:was on Lovenox, pt refusing  use SCDs 3. Pain Management: Prn oxycodone.   4. Mood: LCSW to follow for evaluation and support. Team to provide egosupport as well.   5. Neuropsych: This patient is capable of making decisions on her own behalf. 6. Skin/Wound Care: continue foam dressing to sacrum. Order Prevalon boot for right foot as well as elevation when in bed.   7. Fluids/Electrolytes/Nutrition: Strict I/O. 1200 FR due to ESRD. 8. ESRD: HD after therapy day. Nephrology to manage dialysis,               -AVG site minimally tender  9. CAD: metoprolol, ASA, monitor clinically with functional activities 10. A fib: rate controlled at present. Continue metoprolol   12. DM type II: sliding scale insulin. Check cbg's ac and hs             -on amaryl at home,  resumed for CBGs above 200---follow today 13.  Heme + stool, microscopic, monitor for frank red blood, likely small anal fissure or hemorrhoid plus lovenox, will monitor, transfused in HD  -serial H/H's (10.1 last value improving) 14.  Visual blurring, I do not see an indication for prednisolone gtt, will d/c and observe LOS (Days) 12 A FACE TO FACE EVALUATION WAS PERFORMED  KIRSTEINS,ANDREW E 08/10/2014, 9:15 AM    

## 2014-08-10 NOTE — Progress Notes (Signed)
Inpatient Diabetes Program Recommendations  AACE/ADA: New Consensus Statement on Inpatient Glycemic Control (2013)  Target Ranges:  Prepandial:   less than 140 mg/dL      Peak postprandial:   less than 180 mg/dL (1-2 hours)      Critically ill patients:  140 - 180 mg/dL   Results for Montel CulverVALDES, Briana S (MRN [id number]) as of 08/10/2014 11:26  Ref. Range 08/09/2014 06:38 08/09/2014 12:08 08/09/2014 16:26 08/09/2014 21:32 08/10/2014 06:50  Glucose-Capillary Latest Range: 70-99 mg/dL 98 914236 (H) 782182 (H) 956202 (H) 180 (H)   Diabetes history: DM2 Outpatient Diabetes medications: Amaryl 1 mg QAM Current orders for Inpatient glycemic control: Amaryl 2 mg QAM, Novolog 0-15 units AC  Inpatient Diabetes Program Recommendations Correction (SSI): Please consider ordering Novolog bedtime correction scale. Insulin - Meal Coverage: Please consider ordering meal coverag; recommend ordering Novolog 3 units TID with meals if patient eats at least 50% of meal.  Thanks, Orlando PennerMarie Delainey Winstanley, RN, MSN, CCRN, CDE Diabetes Coordinator  Inpatient Diabetes Program [phone] (Team Pager) [phone] ([facility] office) [phone] [facility] office)

## 2014-08-10 NOTE — Plan of Care (Signed)
Problem: RH BOWEL ELIMINATION Goal: RH STG MANAGE BOWEL WITH ASSISTANCE STG Manage Bowel with min Assistance.  Outcome: Progressing Goal: RH STG MANAGE BOWEL W/MEDICATION W/ASSISTANCE STG Manage Bowel with Medication with min Assistance.  Outcome: Progressing  Problem: RH SAFETY Goal: RH STG ADHERE TO SAFETY PRECAUTIONS W/ASSISTANCE/DEVICE STG Adhere to Safety Precautions With min Assistance/Device.  Outcome: Progressing Goal: RH STG DECREASED RISK OF FALL WITH ASSISTANCE STG Decreased Risk of Fall With min Assistance.  Outcome: Progressing  Problem: RH COGNITION-NURSING Goal: RH STG USES MEMORY AIDS/STRATEGIES W/ASSIST TO PROBLEM SOLVE STG Uses Memory Aids/Strategies With Assistance to Problem Solve.  Outcome: Progressing Goal: RH STG ANTICIPATES NEEDS/CALLS FOR ASSIST W/ASSIST/CUES STG Anticipates Needs/Calls for Assist With Assistance/Cues.  Outcome: Progressing  Problem: RH PAIN MANAGEMENT Goal: RH STG PAIN MANAGED AT OR BELOW PT'S PAIN GOAL Less than 3 out of 10  Outcome: Progressing  Problem: RH KNOWLEDGE DEFICIT Goal: RH STG INCREASE KNOWLEDGE OF DIABETES Patient will verbalize management to diabetes to include diet, exercise, medications, and signs and symptoms of hypo- and hyper- glycemia with min assistance  Outcome: Progressing Goal: RH STG INCREASE KNOWLEDGE OF HYPERTENSION Patient will verbalize management of hypertension to include medications, diet, exercise, and how to take and read a blood pressure value with min assistance  Outcome: Progressing Goal: RH STG INCREASE KNOWLEDGE OF DYSPHAGIA/FLUID INTAKE Patient will verbalize and demonstrate proper swallowing techniques as specified by the speech therapist with min assistance  Outcome: Progressing

## 2014-08-10 NOTE — Progress Notes (Signed)
Patient not scheduled for hemodialysis today but has three medications that are to be given in hemodialysis.  Briana NestlePam Love, PA questioned as to what RN should do with hectorol, aranesp, and hulecit; PA advises to hold the medications since patient not going to hemodialysis.  Will continue to monitor.

## 2014-08-10 NOTE — Consult Note (Signed)
Pt vein mapping reviewed.  She has adequate basilic vein on right side if left side fistula fails.  Hopefully continue to use left arm access.  Will sign off  Fabienne Brunsharles Cielle Aguila, MD [facility] [place] Office: [phone] Pager: [phone]

## 2014-08-10 NOTE — Patient Care Conference (Signed)
Inpatient RehabilitationTeam Conference and Plan of Care Update Date: 08/10/2014   Time: 10;55 AM    Patient Name: Briana Gould      Medical Record Number: [id number]  Date of Birth: [birth year] Sex: Female         Room/Bed: 4W04C/4W04C-01 Payor Info: Payor: MEDICARE / Plan: MEDICARE PART A AND B / Product Type: *No Product type* /    Admitting Diagnosis: Critical illness Myopathy  deconditioning   Admit Date/Time:  07/29/2014  4:46 PM Admission Comments: No comment available   Primary Diagnosis:  Critical illness myopathy Principal Problem: Critical illness myopathy  Patient Active Problem List   Diagnosis Date Noted  . Mild anoxic-ischemic encephalopathy 08/10/2014  . Critical illness myopathy 07/29/2014  . HCAP (healthcare-associated pneumonia) 12/12/2013  . Physical deconditioning 10/15/2013  . CVA (cerebral infarction) 10/01/2013  . Paroxysmal a-fib 10/01/2013  . Cardiac arrest 09/20/2013  . Acute respiratory failure with hypoxia 09/20/2013  . Hypertension 09/20/2013  . Encephalopathy acute 09/20/2013  . Bradycardia- intol to beta blocker 09/03/2013  . Anemia of chronic renal failure 08/30/2013  . Secondary hyperparathyroidism 08/30/2013  . GIB (gastrointestinal bleeding)--recurrent- not a candidate for Plavix 08/29/2013  . CAD- severe 3V CAD at cath March 2015- medical Rx 08/16/2013  . History of syncope 08/16/2013  . Aortic stenosis 08/16/2013  . 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 Cerebrovascular disease 08/15/2013  . Thrombocytopenia 08/14/2013  . Seizure 08/13/2013  . PAD (peripheral artery disease) 08/13/2013  . Weakness generalized 11/08/2011  . ESRD (end stage renal disease) on dialysis 11/04/2011  . MITRAL REGURGITATION 10/04/2010  . AORTIC STENOSIS 10/04/2010  . 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 HYPERLIPIDEMIA 09/13/2010  . OBESITY 09/13/2010  . Essential hypertension 09/13/2010  . PULMONARY HYPERTENSION 09/13/2010  . S/P  unilateral BKA (below knee amputation) 09/13/2010    Expected Discharge Date: Expected Discharge Date: 08/16/14  Team Members Present: Physician leading conference: Dr. Claudette LawsAndrew Kirsteins Social Worker Present: Dossie DerBecky Devinne Epstein, LCSW Nurse Present: Carmie EndAngie Joyce, RN PT Present: Wanda Plumparoline Cook, PT;Blair Hobble, PT SLP Present: Jackalyn LombardNicole Page, SLP PPS Coordinator present : Tora DuckMarie Noel, RN, CRRN     Current Status/Progress Goal Weekly Team Focus  Medical   Therapy tolerance improving, end-stage renal on dialysis  Home discharge  Consider increasing to full  therapy schedule   Bowel/Bladder   Continent of bowel, LBM 08/10/14-patient performed manual disimpaction with assist of nurse, anuric  Continent of bowel  Offer bathroom PRN, assess for signs and symptoms of constipation   Swallow/Nutrition/ Hydration   Regular solids, thin liquids; upgraded to intermittent supervision   Mod I   continued diet tolerance    ADL's   no change from last week - max A with Slide board, mod bathing, total LB dressing, s/u UB self care  min A overall (LTGs modified from mod I last week, may need to be downgraded a 2nd time)  ADL retraining, functional mobility, transfers, UE strengthening, pt/family education, cognitive remediation   Mobility   S/min A bed mobility, Max A sliding board transfers, S WC propulsion x45,'   S bed mobility, Min A basic transfers, Mod A car/furniture transfers, S WC x100'  LE compression for prosthetic fit (anything dialysis can do to help with removing more fluid?), strengthening, activity tolerance, independence with transfers and [facility], family training?   Communication     na        Safety/Cognition/ Behavioral Observations  min assist   supervision   continue  targeting awareness, functional problem solving, memory    Pain   Denies  </=3  Assess for pain qshift and PRN   Skin   Stage I to right heel-boggy, allevyn and prevalon boot in use, healed stage II to sacrum  No new  skin breakdown  Pressure relief to buttocks and right heel      *See Care Plan and progress notes for long and short-term goals.  Barriers to Discharge: See above    Possible Resolutions to Barriers:  See above    Discharge Planning/Teaching Needs:  Will need to begin family education with caregivers this week.  Pt plans to go home with the assistance fo various family members      Team Discussion:  Need to begin family education with caregivers-pt will require physical care. Vein mapping today-brother here a short time. Anemia improving.  Neuro-psych seeing and providing supportive counseling. Stage 2 on sacrum-RN addressing.  Revisions to Treatment Plan:  Downgraded goals to min level   Continued Need for Acute Rehabilitation Level of Care: The patient requires daily medical management by a physician with specialized training in physical medicine and rehabilitation for the following conditions: Daily direction of a multidisciplinary physical rehabilitation program to ensure safe treatment while eliciting the highest outcome that is of practical value to the patient.: Yes Daily medical management of patient stability for increased activity during participation in an intensive rehabilitation regime.: Yes Daily analysis of laboratory values and/or radiology reports with any subsequent need for medication adjustment of medical intervention for : Other  Lucy ChrisDupree, Marquese Burkland G 08/10/2014, 3:17 PM

## 2014-08-11 ENCOUNTER — Inpatient Hospital Stay (HOSPITAL_COMMUNITY): Payer: Medicare Other | Admitting: Occupational Therapy

## 2014-08-11 ENCOUNTER — Inpatient Hospital Stay (HOSPITAL_COMMUNITY): Payer: Medicare Other | Admitting: *Deleted

## 2014-08-11 ENCOUNTER — Inpatient Hospital Stay (HOSPITAL_COMMUNITY): Payer: BC Managed Care – PPO | Admitting: Speech Pathology

## 2014-08-11 LAB — GLUCOSE, CAPILLARY
GLUCOSE-CAPILLARY: 239 mg/dL — AB (ref 70–99)
GLUCOSE-CAPILLARY: 69 mg/dL — AB (ref 70–99)
Glucose-Capillary: 113 mg/dL — ABNORMAL HIGH (ref 70–99)
Glucose-Capillary: 109 mg/dL — ABNORMAL HIGH (ref 70–99)

## 2014-08-11 LAB — RENAL FUNCTION PANEL
Albumin: 3.2 g/dL — ABNORMAL LOW (ref 3.5–5.2)
Anion gap: 16 — ABNORMAL HIGH (ref 5–15)
BUN: 31 mg/dL — ABNORMAL HIGH (ref 6–23)
CALCIUM: 9.7 mg/dL (ref 8.4–10.5)
CO2: 24 mEq/L (ref 19–32)
Chloride: 97 mEq/L (ref 96–112)
Creatinine, Ser: 3.67 mg/dL — ABNORMAL HIGH (ref 0.50–1.10)
GFR calc Af Amer: 14 mL/min — ABNORMAL LOW (ref >90)
GFR calc non Af Amer: 12 mL/min — ABNORMAL LOW (ref >90)
GLUCOSE: 173 mg/dL — AB (ref 70–99)
PHOSPHORUS: 2.9 mg/dL (ref 2.3–4.6)
Potassium: 4.7 mEq/L (ref 3.7–5.3)
Sodium: 137 mEq/L (ref 137–147)

## 2014-08-11 LAB — CBC
HCT: 32.9 % — ABNORMAL LOW (ref 36.0–46.0)
HEMOGLOBIN: 10.2 g/dL — AB (ref 12.0–15.0)
MCH: 30.4 pg (ref 26.0–34.0)
MCHC: 31 g/dL (ref 30.0–36.0)
MCV: 97.9 fL (ref 78.0–100.0)
Platelets: 240 10*3/uL (ref 150–400)
RBC: 3.36 MIL/uL — ABNORMAL LOW (ref 3.87–5.11)
RDW: 19.7 % — ABNORMAL HIGH (ref 11.5–15.5)
WBC: 8.6 10*3/uL (ref 4.0–10.5)

## 2014-08-11 MED ORDER — ALTEPLASE 2 MG IJ SOLR
2.0000 mg | Freq: Once | INTRAMUSCULAR | Status: DC | PRN
  Filled 2014-08-11: qty 2

## 2014-08-11 MED ORDER — HEPARIN SODIUM (PORCINE) 1000 UNIT/ML DIALYSIS: 1000.0000 [IU] | INTRAMUSCULAR | Status: DC | PRN

## 2014-08-11 MED ORDER — HEPARIN SODIUM (PORCINE) 1000 UNIT/ML DIALYSIS
1000.0000 [IU] | INTRAMUSCULAR | Status: DC | PRN
  Filled 2014-08-11: qty 1

## 2014-08-11 MED ORDER — MIDODRINE HCL 5 MG PO TABS: 5.0000 mg | ORAL_TABLET | ORAL | Status: AC

## 2014-08-11 MED ORDER — ALTEPLASE 2 MG IJ SOLR: 2.0000 mg | Freq: Once | INTRAMUSCULAR | Status: DC | PRN

## 2014-08-11 MED ORDER — PENTAFLUOROPROP-TETRAFLUOROETH EX AERO: 1.0000 "application " | INHALATION_SPRAY | CUTANEOUS | Status: DC | PRN

## 2014-08-11 MED ORDER — MIDODRINE HCL 5 MG PO TABS
ORAL_TABLET | ORAL | Status: AC
  Administered 2014-08-11: 18:00:00
  Filled 2014-08-11: qty 1

## 2014-08-11 MED ORDER — SODIUM CHLORIDE 0.9 % IV SOLN: 100.0000 mL | INTRAVENOUS | Status: DC | PRN

## 2014-08-11 MED ORDER — NEPRO/CARBSTEADY PO LIQD: 237.0000 mL | ORAL | Status: DC | PRN

## 2014-08-11 MED ORDER — LIDOCAINE-PRILOCAINE 2.5-2.5 % EX CREA: 1.0000 "application " | TOPICAL_CREAM | CUTANEOUS | Status: DC | PRN

## 2014-08-11 MED ORDER — LIDOCAINE HCL (PF) 1 % IJ SOLN: 5.0000 mL | INTRAMUSCULAR | Status: DC | PRN

## 2014-08-11 MED ORDER — LIDOCAINE-PRILOCAINE 2.5-2.5 % EX CREA
1.0000 "application " | TOPICAL_CREAM | CUTANEOUS | Status: DC | PRN
  Filled 2014-08-11: qty 5

## 2014-08-11 MED ORDER — HEPARIN SODIUM (PORCINE) 1000 UNIT/ML DIALYSIS: 5000.0000 [IU] | Freq: Once | INTRAMUSCULAR | Status: DC

## 2014-08-11 MED ORDER — HEPARIN SODIUM (PORCINE) 1000 UNIT/ML DIALYSIS
5000.0000 [IU] | Freq: Once | INTRAMUSCULAR | Status: DC
  Filled 2014-08-11: qty 5

## 2014-08-11 NOTE — Plan of Care (Signed)
Problem: RH BOWEL ELIMINATION Goal: RH STG MANAGE BOWEL WITH ASSISTANCE STG Manage Bowel with min Assistance.  Outcome: Progressing Goal: RH STG MANAGE BOWEL W/MEDICATION W/ASSISTANCE STG Manage Bowel with Medication with min Assistance.  Outcome: Progressing  Problem: RH SAFETY Goal: RH STG ADHERE TO SAFETY PRECAUTIONS W/ASSISTANCE/DEVICE STG Adhere to Safety Precautions With min Assistance/Device.  Outcome: Progressing Goal: RH STG DECREASED RISK OF FALL WITH ASSISTANCE STG Decreased Risk of Fall With min Assistance.  Outcome: Progressing     

## 2014-08-11 NOTE — Progress Notes (Signed)
Speech Language Pathology Daily Session Note  Patient Details  Name: Briana Gould MRN: [id number] Date of Birth: 1946-06-14  Today's Date: 08/11/2014 SLP Individual Time: 1500-1530 SLP Individual Time Calculation (min): 30 min  Short Term Goals: Week 2: SLP Short Term Goal 1 (Week 2): Pt will tolerate her currently prescribed diet with no overt s/s of aspiration and mod I use of swallowing precautions.  SLP Short Term Goal 2 (Week 2): Patient will utilize external memory aids to recall new, daily information with supervision multimodal cues.  SLP Short Term Goal 3 (Week 2): Patient will demonstrate functional problem solving for mildly complex tasks with supervision multimodal cues.  SLP Short Term Goal 4 (Week 2): Patient will self-monitor and correct errors during functional tasks with supervision question cues.   Skilled Therapeutic Interventions:  Pt was seen for skilled ST targeting cognitive goals.  Upon arrival, pt was seated upright in wheelchair, awake, alert, and agreeable to participate in ST.  Overall, pt presented with brighter affect in comparison to previous therapy sessions.  SLP facilitated the session with a previously taught card game targeting delayed recall, planning, organization, and error awareness.  Pt recalled rules of game with supervision question cues.  Pt also benefited from intermittent min assist cues to complete the abovementioned activity due to decreased mental flexibility.  Overall, pt is progressing towards meeting goals.  Continue per current plan of care.    FIM:  Comprehension Comprehension Mode: Auditory Comprehension: 5-Follows basic conversation/direction: With extra time/assistive device Expression Expression Mode: Verbal Expression: 5-Expresses basic needs/ideas: With extra time/assistive device Social Interaction Social Interaction: 4-Interacts appropriately 75 - 89% of the time - Needs redirection for appropriate language or to initiate  interaction. Problem Solving Problem Solving: 3-Solves basic 50 - 74% of the time/requires cueing 25 - 49% of the time Memory Memory: 2-Recognizes or recalls 25 - 49% of the time/requires cueing 51 - 75% of the time  Pain Pain Assessment Pain Assessment: No/denies pain   Therapy/Group: Individual Therapy  Signora Zucco, Melanee SpryNicole L 08/11/2014, 4:32 PM

## 2014-08-11 NOTE — Progress Notes (Signed)
[facility] Progress Note   Subjective: stump swelling down with extra HD, happy to get prosthesis on yesterday  Filed Vitals:   08/10/14 2228 08/10/14 2240 08/10/14 2314 08/11/14 0346  BP: 136/66 127/62 145/70 140/68  Pulse: 78 78 83 80  Temp: 98.5 F (36.9 C)  98.6 F (37 C) 98.8 F (37.1 C)  TempSrc: Oral  Oral Oral  Resp: 20 18 18 20   Weight:      SpO2: 99%  98% 100%   Exam: Alert, up in WC No jvd RRR 1/6 SEM LSB Chest clear bilat Abd soft, NTND, no ascites Left BKA, RLE 2+ edema Neuro is nf, Ox 3 Left IJ cath and LUA AVF patent with firm hematoma present  HD: MWF [place]  4h   75kg   2/2.0 Bath   Heparin 5000    LUA AVF / L IJ cath  350/A1.5  Profile 4  Limit UF to 3 L/ treatment Hectorol 2, Aranesp 150/wk  , Venofer 50 /wk       Assessment: 1. Critical illness myopathy - rehab started 11/20 2. Afib - not A/C candidate, on MTP for rate control 3. ESRD on HD - max BFR 350 and max UF 3kg, keep BP over 120, midodrine pre HD 4. HTN/ volume - down 86 > 79kg now, still up. On MTP for CAD  5. 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 DM per primary 8. Left AVF hematoma/ infiltration - shuntogram 11/4 patent access. OK to use AVF per VVS 9. Cardiac arrest - 3rd episode 10/19.  10. Severe multivessel CAD - medical Rx, on MTP 12.5 bid 11. Heme +stool - no frank bleed, s/p 2u prbc  Plan- HD Friday , use AVF it pt willing. Extra HD Sat.     Vinson Moselleob Quoc Tome MD  pager [phone]    cell [phone]  08/11/2014, 11:23 AM     Recent Labs Lab 08/05/14 1544 08/08/14 2225 08/09/14 1918 08/10/14 1920  NA 136* 132* 139 139  K 4.9 4.8 3.5* 4.0  CL 93* 89* 98 97  CO2 23 23 26 25   GLUCOSE 176* 82 161* 174*  BUN 72* 73* 22 36*  CREATININE 6.10* 6.54* 2.42* 4.03*  CALCIUM 9.4 9.6 9.4 9.5  PHOS 2.9 3.7  --   --     Recent Labs Lab 08/05/14 1544 08/08/14 2225  ALBUMIN 3.4* 3.3*     Recent Labs Lab 08/06/14 0454 08/08/14 2225 08/09/14 1918  WBC 10.7* 11.4* 10.1  HGB 9.9* 9.9* 10.1*  HCT 30.8* 30.6* 31.2*  MCV 98.1 95.3 98.4  PLT 215 248 231   . sodium chloride   Intravenous Once  . sodium chloride   Intravenous Once  . aspirin EC  81 mg Oral Daily  . darbepoetin (ARANESP) injection - DIALYSIS  150 mcg Intravenous Q Wed-HD  . docusate sodium  100 mg Oral BID  . doxercalciferol  5 mcg Intravenous Q M,W,F-HD  . feeding supplement (NEPRO CARB STEADY)  237 mL Oral TID WC  . ferric gluconate (FERRLECIT/NULECIT) IV  62.5 mg Intravenous Q Wed-HD  . glimepiride  2 mg Oral Q breakfast  . hydrocerin   Topical BID  . insulin aspart  0-15 Units Subcutaneous TID WC  . metoCLOPramide  10 mg Oral TID AC & HS  . metoprolol tartrate  12.5 mg Oral 4 times per day on Sun Tue Thu  Sat  . midodrine  5 mg Oral Q M,W,F-HD  . multivitamin  1 tablet Oral QHS  . pantoprazole  40 mg Oral Daily  . polyethylene glycol  17 g Oral Daily  . sevelamer carbonate  800 mg Oral TID WC     acetaminophen, antiseptic oral rinse, diphenhydrAMINE, docusate sodium, ondansetron **OR** ondansetron (ZOFRAN) IV, oxyCODONE, sorbitol

## 2014-08-11 NOTE — Progress Notes (Signed)
Physical Therapy Session Note  Patient Details  Name: Briana Gould MRN: [id number] Date of Birth: [birth year]  Today's Date: 08/11/2014 PT Individual Time: 1100-1200 PT Individual Time Calculation (min): 60 min   Short Term Goals: Week 2:  PT Short Term Goal 1 (Week 2): = LTGs  Skilled Therapeutic Interventions/Progress Updates:    [name]. Session focused on donning/doffing prosthesis, increasing wear time of prosthesis, increasing activity tolerance with wheelchair mobility, stand pivot transfers, and sit<>stand transfers. Wheelchair mobility 4075' x1 and 4255' x1 with B UE and R LE and supervision, patient requires increased time. Patient able to don prothesis sitting in wheelchair without use of step stool and without assistance. Stand pivot transfer wheelchair<>mat with maxA for initial sit>stand, then minA for pivoting once patient is standing. Sitting EOM, focus on scooting forward in preparation for sit>stand. Emphasis on lateral leans to assist with anterior scooting of each hip. Repeated sit<>stands x7 from EOM with RW and standing for 15-30" in between each stand. Repeated cues throughout session for proper hand placement for sit<>stands. Patient able to doff prothesis sitting in wheelchair without use of step stool and without assistance. Patient returned to room and left sitting in wheelchair with all needs within reach.  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 Jaryan Chicoine, PT, DPT 08/11/2014, 11:44 AM

## 2014-08-11 NOTE — Progress Notes (Signed)
Occupational Therapy Weekly Progress Note  Patient Details  Name: Briana Gould MRN: [id number] Date of Birth: Mar 26, 1946  Beginning of progress report period: August 05, 2014 End of progress report period: August 11, 2014  Today's Date: 08/11/2014 OT Individual Treatment Time: 805-905 (60 min)    Patient has met 0 of 4 short term goals.  Pt is progressing toward some of her STGs, but our approach to completing her ADL routine has changed as of today. Pt was able to don her L prosthesis for the first time today, which allowed her to sit to stand and stand pivot with RW with mod A.  She performs her self care routine more easily from this level as opposed to sliding board to [facility] or lateral leans on BSC.  Patient continues to demonstrate the following deficits: generalized muscle weakness and deconditioning and therefore will continue to benefit from skilled OT intervention to enhance overall performance with BADL.  Patient not progressing toward long term goals.  See goal revision..  Plan of care revisions: LTG for LB dressing changed to mod A versus min A. Toileting goal changed to min A for hygiene only, max A to pull pants up/down hips, and mod A for a simulated (dry run) shower transfer.  OT Short Term Goals Week 1:  OT Short Term Goal 1 (Week 1): Pt would be able to transfer to and from BSC/ toliet with mod A  OT Short Term Goal 1 - Progress (Week 1): Not met OT Short Term Goal 2 (Week 1): Pt would tolerate prosethesis on during transfer training OT Short Term Goal 2 - Progress (Week 1): Not met OT Short Term Goal 3 (Week 1): Pt would be able to perform toielting after BM with mod A  OT Short Term Goal 3 - Progress (Week 1): Not met OT Short Term Goal 4 (Week 1): Pt will perform UB dressing with setup  OT Short Term Goal 4 - Progress (Week 1): Met Week 2:  OT Short Term Goal 1 (Week 2): Pt will complete slide board transfer to [facility] with mod A x 1. OT Short Term Goal 1 - Progress  (Week 2): Progressing toward goal OT Short Term Goal 2 (Week 2): Pt will be able to don pants over both legs from supine position. OT Short Term Goal 2 - Progress (Week 2): Progressing toward goal OT Short Term Goal 3 (Week 2): Pt will be able to don underwear and/or pants over hips with min A by rolling in bed. OT Short Term Goal 3 - Progress (Week 2): Progressing toward goal OT Short Term Goal 4 (Week 2): Pt will be able to lateral lean to both sides on BSC to be able to perform hygiene. OT Short Term Goal 4 - Progress (Week 2): Not progressing Week 3:  OT Short Term Goal 1 (Week 3): STGs = LTGs  Skilled Therapeutic Interventions/Progress Updates:    Pt seen for BADL retraining of B/D with a focus on activity tolerance and functional mobility. Pt was able to don her prosthetic sleeve and therapist donned prosthesis.  Pt practiced a sit><stand with RW from EOB 2x with mod A, on 2nd time pt completed a stand pivot with RW to w/c.  Instead of completing self care from bed level (her body habitus severely limits her ability to reach), pt agreed to sit on [facility] over toilet. She had great difficulty trying to use grab bars, so instead used RW to stand pivot. Even with open commode  seat, pt could not reach effectively to cleanse bottom. Pt stood for the 4th time during session while therapist assisted with cleansing and clothing management. LTGs have been modified to reflect this.  Pt needed frequent rest breaks as each stand was very fatiguing, but did put much more effort in today.  Pt resting in w/c with legs elevated at end of session with call light in reach.    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

## 2014-08-11 NOTE — Progress Notes (Signed)
Subjective/Complaints: No blurring of vision No blodd in stool Slow progress in therapy, CIM superimposed on chronic disease (ESRD) Review of Systems - otherwise neg Objective: Vital Signs: Blood pressure 140/68, pulse 80, temperature 98.8 F (37.1 C), temperature source Oral, resp. rate 20, weight 79.742 kg (175 lb 12.8 oz), SpO2 100 %. No results found. Results for orders placed or performed during the hospital encounter of 07/29/14 (from the past 72 hour(s))  Glucose, capillary     Status: Abnormal   Collection Time: 08/08/14 11:55 AM  Result Value Ref Range   Glucose-Capillary 194 (H) 70 - 99 mg/dL  Glucose, capillary     Status: Abnormal   Collection Time: 08/08/14  4:17 PM  Result Value Ref Range   Glucose-Capillary 262 (H) 70 - 99 mg/dL  Glucose, capillary     Status: None   Collection Time: 08/08/14  9:19 PM  Result Value Ref Range   Glucose-Capillary 89 70 - 99 mg/dL  Renal function panel     Status: Abnormal   Collection Time: 08/08/14 10:25 PM  Result Value Ref Range   Sodium 132 (L) 137 - 147 mEq/L   Potassium 4.8 3.7 - 5.3 mEq/L   Chloride 89 (L) 96 - 112 mEq/L   CO2 23 19 - 32 mEq/L   Glucose, Bld 82 70 - 99 mg/dL   BUN 73 (H) 6 - 23 mg/dL   Creatinine, Ser 6.54 (H) 0.50 - 1.10 mg/dL   Calcium 9.6 8.4 - 10.5 mg/dL   Phosphorus 3.7 2.3 - 4.6 mg/dL   Albumin 3.3 (L) 3.5 - 5.2 g/dL   GFR calc non Af Amer 6 (L) >90 mL/min   GFR calc Af Amer 7 (L) >90 mL/min    Comment: (NOTE) The eGFR has been calculated using the CKD EPI equation. This calculation has not been validated in all clinical situations. eGFR's persistently <90 mL/min signify possible Chronic Kidney Disease.    Anion gap 20 (H) 5 - 15  CBC     Status: Abnormal   Collection Time: 08/08/14 10:25 PM  Result Value Ref Range   WBC 11.4 (H) 4.0 - 10.5 K/uL   RBC 3.21 (L) 3.87 - 5.11 MIL/uL   Hemoglobin 9.9 (L) 12.0 - 15.0 g/dL   HCT 30.6 (L) 36.0 - 46.0 %   MCV 95.3 78.0 - 100.0 fL   MCH 30.8  26.0 - 34.0 pg   MCHC 32.4 30.0 - 36.0 g/dL   RDW 19.7 (H) 11.5 - 15.5 %   Platelets 248 150 - 400 K/uL  Glucose, capillary     Status: None   Collection Time: 08/09/14  6:38 AM  Result Value Ref Range   Glucose-Capillary 98 70 - 99 mg/dL  Glucose, capillary     Status: Abnormal   Collection Time: 08/09/14 12:08 PM  Result Value Ref Range   Glucose-Capillary 236 (H) 70 - 99 mg/dL  Glucose, capillary     Status: Abnormal   Collection Time: 08/09/14  4:26 PM  Result Value Ref Range   Glucose-Capillary 182 (H) 70 - 99 mg/dL   Comment 1 Notify RN   CBC     Status: Abnormal   Collection Time: 08/09/14  7:18 PM  Result Value Ref Range   WBC 10.1 4.0 - 10.5 K/uL   RBC 3.17 (L) 3.87 - 5.11 MIL/uL   Hemoglobin 10.1 (L) 12.0 - 15.0 g/dL   HCT 31.2 (L) 36.0 - 46.0 %   MCV 98.4 78.0 - 100.0 fL  MCH 31.9 26.0 - 34.0 pg   MCHC 32.4 30.0 - 36.0 g/dL   RDW 19.9 (H) 11.5 - 15.5 %   Platelets 231 150 - 400 K/uL  Basic metabolic panel     Status: Abnormal   Collection Time: 08/09/14  7:18 PM  Result Value Ref Range   Sodium 139 137 - 147 mEq/L   Potassium 3.5 (L) 3.7 - 5.3 mEq/L   Chloride 98 96 - 112 mEq/L   CO2 26 19 - 32 mEq/L   Glucose, Bld 161 (H) 70 - 99 mg/dL   BUN 22 6 - 23 mg/dL    Comment: DELTA CHECK NOTED DIALYSIS    Creatinine, Ser 2.42 (H) 0.50 - 1.10 mg/dL    Comment: DELTA CHECK NOTED DIALYSIS    Calcium 9.4 8.4 - 10.5 mg/dL   GFR calc non Af Amer 19 (L) >90 mL/min   GFR calc Af Amer 23 (L) >90 mL/min    Comment: (NOTE) The eGFR has been calculated using the CKD EPI equation. This calculation has not been validated in all clinical situations. eGFR's persistently <90 mL/min signify possible Chronic Kidney Disease.    Anion gap 15 5 - 15  Glucose, capillary     Status: Abnormal   Collection Time: 08/09/14  9:32 PM  Result Value Ref Range   Glucose-Capillary 202 (H) 70 - 99 mg/dL  Glucose, capillary     Status: Abnormal   Collection Time: 08/10/14  6:50 AM   Result Value Ref Range   Glucose-Capillary 180 (H) 70 - 99 mg/dL  Glucose, capillary     Status: Abnormal   Collection Time: 08/10/14 12:04 PM  Result Value Ref Range   Glucose-Capillary 100 (H) 70 - 99 mg/dL  Glucose, capillary     Status: Abnormal   Collection Time: 08/10/14  4:28 PM  Result Value Ref Range   Glucose-Capillary 204 (H) 70 - 99 mg/dL  Basic metabolic panel     Status: Abnormal   Collection Time: 08/10/14  7:20 PM  Result Value Ref Range   Sodium 139 137 - 147 mEq/L   Potassium 4.0 3.7 - 5.3 mEq/L   Chloride 97 96 - 112 mEq/L   CO2 25 19 - 32 mEq/L   Glucose, Bld 174 (H) 70 - 99 mg/dL   BUN 36 (H) 6 - 23 mg/dL    Comment: DELTA CHECK NOTED   Creatinine, Ser 4.03 (H) 0.50 - 1.10 mg/dL    Comment: DELTA CHECK NOTED   Calcium 9.5 8.4 - 10.5 mg/dL   GFR calc non Af Amer 10 (L) >90 mL/min   GFR calc Af Amer 12 (L) >90 mL/min    Comment: (NOTE) The eGFR has been calculated using the CKD EPI equation. This calculation has not been validated in all clinical situations. eGFR's persistently <90 mL/min signify possible Chronic Kidney Disease.    Anion gap 17 (H) 5 - 15  Glucose, capillary     Status: None   Collection Time: 08/10/14 11:07 PM  Result Value Ref Range   Glucose-Capillary 92 70 - 99 mg/dL  Glucose, capillary     Status: Abnormal   Collection Time: 08/11/14  6:59 AM  Result Value Ref Range   Glucose-Capillary 113 (H) 70 - 99 mg/dL     HEENT: normal, eyes non injected, no drainage Cardio: RRR and no murmur Resp: CTA B/L and unlabored GI: BS positive and NT, ND Extremity:  Pulses positive and trace LE Edema Skin:   Intact Neuro:  Alert/Oriented. Improved trunk control Musc/Skel:  Swelling Left arm, with good thrill at AV graft site--minimal pain Gen NAD   Assessment/Plan: 1. Functional deficits secondary to Critical illness myopathy anoxic encepholopathy which require 3+ hours per day of interdisciplinary therapy in a comprehensive inpatient rehab  setting. Physiatrist is providing close team supervision and 24 hour management of active medical problems listed below. Physiatrist and rehab team continue to assess barriers to discharge/monitor patient progress toward functional and medical goals.  FIM: FIM - Bathing Bathing Steps Patient Completed: Chest, Right Arm, Left Arm, Abdomen, Right upper leg, Left upper leg, Front perineal area (9/9 parts) Bathing: 4: Min-Patient completes 8-9 55f10 parts or 75+ percent  FIM - Upper Body Dressing/Undressing Upper body dressing/undressing steps patient completed: Thread/unthread right sleeve of pullover shirt/dresss, Thread/unthread left sleeve of pullover shirt/dress, Put head through opening of pull over shirt/dress Upper body dressing/undressing: 5: Supervision: Safety issues/verbal cues FIM - Lower Body Dressing/Undressing Lower body dressing/undressing steps patient completed: Thread/unthread left underwear leg Lower body dressing/undressing: 1: Total-Patient completed less than 25% of tasks  FIM - Toileting Toileting steps completed by patient: Performs perineal hygiene Toileting: 0: Activity did not occur  FIM - TRadio producerDevices: Bedside commode, Sliding board Toilet Transfers: 2-To toilet/BSC: Max A (lift and lower assist)  FIM - BControl and instrumentation engineerDevices: Sliding board, Arm rests, Bed rails Bed/Chair Transfer: 3: Bed > Chair or W/C: Mod A (lift or lower assist)  FIM - Locomotion: Wheelchair Distance: 100 Locomotion: Wheelchair: 2: Travels 50 - 149 ft with supervision, cueing or coaxing FIM - Locomotion: Ambulation Ambulation/Gait Assistance: Not tested (comment) Locomotion: Ambulation: 0: Activity did not occur  Comprehension Comprehension Mode: Auditory Comprehension: 5-Follows basic conversation/direction: With extra time/assistive device  Expression Expression Mode: Verbal Expression: 5-Expresses basic  needs/ideas: With extra time/assistive device  Social Interaction Social Interaction: 4-Interacts appropriately 75 - 89% of the time - Needs redirection for appropriate language or to initiate interaction.  Problem Solving Problem Solving: 3-Solves basic 50 - 74% of the time/requires cueing 25 - 49% of the time  Memory Memory: 2-Recognizes or recalls 25 - 49% of the time/requires cueing 51 - 75% of the time  Medical Problem List and Plan: 1. 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   2.  DVT Prophylaxis/Anticoagulation: Pharmaceutical:was on Lovenox, pt refusing  use SCDs 3. Pain Management: Prn oxycodone.   4. Mood: LCSW to follow for evaluation and support. Team to provide egosupport as well.   5. Neuropsych: This patient is capable of making decisions on her own behalf. 6. Skin/Wound Care: continue foam dressing to sacrum. Order Prevalon boot for right foot as well as elevation when in bed.   7. Fluids/Electrolytes/Nutrition: Strict I/O. 1200 FR due to ESRD. 8. ESRD: HD after therapy day. Nephrology to manage dialysis,               -AVG site minimally tender  9. CAD: metoprolol, ASA, monitor clinically with functional activities 10. A fib: rate controlled at present. Continue metoprolol   12. DM type II: sliding scale insulin. Check cbg's ac and hs             -on amaryl at home,  resumed for CBGs above 200---follow today 13.  Heme + stool, microscopic, monitor for frank red blood, likely small anal fissure or hemorrhoid plus lovenox, will monitor,  transfused in HD, rec GI f/u as outpt  -serial H/H's (10.1 last value improving) 14.  Visual blurring,resolved LOS (Days) 13 A FACE TO FACE EVALUATION WAS PERFORMED  KIRSTEINS,ANDREW E 08/11/2014, 9:07 AM

## 2014-08-11 NOTE — Progress Notes (Signed)
Hypoglycemic Event  CBG: 69  Treatment: 15 GM carbohydrate snack, Ate her dinner  Symptoms: None  Follow-up CBG: Time: CBG Result:109  Possible Reasons for Event: Other: went to dialysis skipped dinner  Comments/MD notified:Dan Anguilli, PA notified. Ordered to continue to monitor     Briana Gould, Briana Gould  Remember to initiate Hypoglycemia Order Set & complete

## 2014-08-11 NOTE — Procedures (Signed)
I was present at this session.  I have reviewed the session itself and made appropriate changes.  tol HD, flow low , recent infilt.     Tineka Uriegas L 12/3/20155:32 PM

## 2014-08-12 ENCOUNTER — Encounter (HOSPITAL_COMMUNITY): Payer: BC Managed Care – PPO | Admitting: Speech Pathology

## 2014-08-12 ENCOUNTER — Inpatient Hospital Stay (HOSPITAL_COMMUNITY): Payer: Medicare Other | Admitting: *Deleted

## 2014-08-12 ENCOUNTER — Inpatient Hospital Stay (HOSPITAL_COMMUNITY): Payer: Medicare Other | Admitting: Occupational Therapy

## 2014-08-12 LAB — CBC
HCT: 31.9 % — ABNORMAL LOW (ref 36.0–46.0)
HEMOGLOBIN: 9.9 g/dL — AB (ref 12.0–15.0)
MCH: 30.2 pg (ref 26.0–34.0)
MCHC: 31 g/dL (ref 30.0–36.0)
MCV: 97.3 fL (ref 78.0–100.0)
Platelets: 255 10*3/uL (ref 150–400)
RBC: 3.28 MIL/uL — ABNORMAL LOW (ref 3.87–5.11)
RDW: 19.4 % — AB (ref 11.5–15.5)
WBC: 9.1 10*3/uL (ref 4.0–10.5)

## 2014-08-12 LAB — RENAL FUNCTION PANEL
ALBUMIN: 3.2 g/dL — AB (ref 3.5–5.2)
Anion gap: 17 — ABNORMAL HIGH (ref 5–15)
BUN: 32 mg/dL — ABNORMAL HIGH (ref 6–23)
CO2: 25 mEq/L (ref 19–32)
CREATININE: 3.61 mg/dL — AB (ref 0.50–1.10)
Calcium: 9.6 mg/dL (ref 8.4–10.5)
Chloride: 95 mEq/L — ABNORMAL LOW (ref 96–112)
GFR calc Af Amer: 14 mL/min — ABNORMAL LOW (ref >90)
GFR calc non Af Amer: 12 mL/min — ABNORMAL LOW (ref >90)
GLUCOSE: 69 mg/dL — AB (ref 70–99)
PHOSPHORUS: 2.5 mg/dL (ref 2.3–4.6)
Potassium: 4.4 mEq/L (ref 3.7–5.3)
Sodium: 137 mEq/L (ref 137–147)

## 2014-08-12 LAB — GLUCOSE, CAPILLARY
GLUCOSE-CAPILLARY: 252 mg/dL — AB (ref 70–99)
Glucose-Capillary: 148 mg/dL — ABNORMAL HIGH (ref 70–99)
Glucose-Capillary: 92 mg/dL (ref 70–99)
Glucose-Capillary: 49 mg/dL — ABNORMAL LOW (ref 70–99)
Glucose-Capillary: 128 mg/dL — ABNORMAL HIGH (ref 70–99)

## 2014-08-12 MED ORDER — DOXERCALCIFEROL 4 MCG/2ML IV SOLN
INTRAVENOUS | Status: AC
  Filled 2014-08-12: qty 4

## 2014-08-12 NOTE — Progress Notes (Signed)
Social Work [name], female   DOB: 09/13/1945, 68 y.o.   MRN: [id number] Family here for family education and learning her care, aware of the need for 24 hr care at home.  Will make home health arrangements and equipment needed.  Pt is ready to go home.

## 2014-08-12 NOTE — Progress Notes (Signed)
Speech Language Pathology Discharge Summary  Patient Details  Name: Briana Gould MRN: [id number] Date of Birth: 07/14/1946  Today's Date: 08/12/2014 SLP Individual Time: 1000-1018 SLP Individual Time Calculation (min): 18 min   Skilled Therapeutic Interventions:  Pt was seen for skilled ST targeting completion of family education.  Upon arrival, pt's brother and close friend were present, pt seated upright in wheelchair, awake, alert, and agreeable to participate in ST.  SLP reviewed and reinforced recommendations for 24/7 supervision and assistance for medication and financial management at discharge due to baseline cognitive impairments and overall debilitated state which makes it difficult to do basic things independently.  Pt and pt's family verbalized understanding and were in agreement with the abovementioned recommendations.  Per pt's brother, family is planning to piece together 24/7 supervision.  Pt's friend and brother both endorse that pt is returned to her cognitive baseline.  All pt's and pt's family's questions were answered to their satisfaction.  No further training recommended at this time for ST.       Patient has met 4 of 4 long term goals.  Patient to discharge at overall Supervision level.  Reasons goals not met: n/a   Clinical Impression/Discharge Summary:  Pt made functional gains while inpatient and is discharging from speech having met 4 out of 4 long term goals due to improved problem solving, awareness, use of compensatory aids for recall of new information, and use of swallowing precautions.  Currently, pt requires supervision level assist for basic cognitive tasks and utilizes her recommended swallowing precautions to minimize overt s/s of aspiration with regular solids and thin liquids.  Pt and pt's family endorse return to cognitive baseline.  Pt is discharging home with 24/7 supervision from family as well as assistance for medication and financial management.   Training is complete at this time and no further ST needs are indicated.     Care Partner:  Caregiver Able to Provide Assistance: Yes  Type of Caregiver Assistance: Physical;Cognitive  Recommendation:  24 hour supervision/assistance      Equipment: none recommended by SLP    Reasons for discharge: Treatment goals met   Patient/Family Agrees with Progress Made and Goals Achieved: Yes   See FIM for current functional status  Nicole Page, M.A. CCC-SLP  Page, Nicole L 08/12/2014, 12:28 PM    

## 2014-08-12 NOTE — Progress Notes (Signed)
Social Work [name], female   DOB: [birth year], 68 y.o.   MRN: [id number] Team feels pt would benefit from longer stay due to wearing prothesis now and could make more progress and does need more family education. They are asking for next Friday, have texted MD and will await response.  Contacted HD center and she has the 12;15 slot her 7;15 slot was filled and Dr. Lowell GuitarPowell will Check in with her this week.  Will await MD response.

## 2014-08-12 NOTE — Plan of Care (Signed)
Problem: RH Tub/Shower Transfers Goal: LTG Patient will perform tub/shower transfers w/assist (OT) LTG: Patient will perform tub/shower transfers with assist, with/without cues using equipment (OT)  Goal D/C as pt has a portocath that can not get wet.

## 2014-08-12 NOTE — Progress Notes (Signed)
Occupational Therapy Session Note  Patient Details  Name: Briana Gould MRN: [id number] Date of Birth: 06-13-1946  Today's Date: 08/12/2014 OT Individual Time: 0900-1000 OT Individual Time Calculation (min): 60 min    Short Term Goals: Week 1:  OT Short Term Goal 1 (Week 1): Pt would be able to transfer to and from BSC/ toliet with mod A  OT Short Term Goal 1 - Progress (Week 1): Not met OT Short Term Goal 2 (Week 1): Pt would tolerate prosethesis on during transfer training OT Short Term Goal 2 - Progress (Week 1): Not met OT Short Term Goal 3 (Week 1): Pt would be able to perform toielting after BM with mod A  OT Short Term Goal 3 - Progress (Week 1): Not met OT Short Term Goal 4 (Week 1): Pt will perform UB dressing with setup  OT Short Term Goal 4 - Progress (Week 1): Met Week 2:  OT Short Term Goal 1 (Week 2): Pt will complete slide board transfer to [facility] with mod A x 1. OT Short Term Goal 1 - Progress (Week 2): Progressing toward goal OT Short Term Goal 2 (Week 2): Pt will be able to don pants over both legs from supine position. OT Short Term Goal 2 - Progress (Week 2): Progressing toward goal OT Short Term Goal 3 (Week 2): Pt will be able to don underwear and/or pants over hips with min A by rolling in bed. OT Short Term Goal 3 - Progress (Week 2): Progressing toward goal OT Short Term Goal 4 (Week 2): Pt will be able to lateral lean to both sides on BSC to be able to perform hygiene. OT Short Term Goal 4 - Progress (Week 2): Not progressing Week 3:  OT Short Term Goal 1 (Week 3): STGs = LTGs  Skilled Therapeutic Interventions/Progress Updates:    Pt seen for BADL retraining with a focus on family and caregiver education. Pt was sitting on EOB at start of session. She bathed L residual limb and then donned her prosthesis. Her family arrived at this point and observed how she stood from EOB with min A to RW and completed a stand pivot to her w/c. Pt proceeded to bathe at sink.  Discussed with pt and family her home shower set up. She uses a walkin shower with a curtain with a tub bench that goes over lip of shower so pt will be able to easily transfer to it. At this time., pt can not shower as she has a portocath.  Tub bench transfer goal d/c'd.  If family gets approval to cover site with waterproof cover, she may initiate this task at home with [name].  At this time, pt continues to have extremely low activity tolerance and a shower will be too taxing. Discussed using a BSC over toilet and installing grab bars next to the toilet.  Pt began to bathe LB and her brother stepped out of the room and her caregiver stayed to play an active role. Her caregiver return demonstrated how to assist pt with sit><stand to RW, how to don LB clothing and assist pt wash her bottom. Pt needed several rest breaks.  Once self care completed, her next therapist arrived.  It would be beneficial to continue Family Education on Monday prior to her D/c to home.    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  ADL: ADL ADL Comments: see FIM  See FIM for current functional status  Therapy/Group: Individual Therapy  SAGUIER,JULIA 08/12/2014, 11:32 AM

## 2014-08-12 NOTE — Progress Notes (Signed)
Subjective/Complaints: Feeling fairly well. Slept ok.  Review of Systems - otherwise neg Objective: Vital Signs: Blood pressure 147/55, pulse 73, temperature 98.1 F (36.7 C), temperature source Oral, resp. rate 17, weight 79.742 kg (175 lb 12.8 oz), SpO2 100 %. No results found. Results for orders placed or performed during the hospital encounter of 07/29/14 (from the past 72 hour(s))  Glucose, capillary     Status: Abnormal   Collection Time: 08/09/14 12:08 PM  Result Value Ref Range   Glucose-Capillary 236 (H) 70 - 99 mg/dL  Glucose, capillary     Status: Abnormal   Collection Time: 08/09/14  4:26 PM  Result Value Ref Range   Glucose-Capillary 182 (H) 70 - 99 mg/dL   Comment 1 Notify RN   CBC     Status: Abnormal   Collection Time: 08/09/14  7:18 PM  Result Value Ref Range   WBC 10.1 4.0 - 10.5 K/uL   RBC 3.17 (L) 3.87 - 5.11 MIL/uL   Hemoglobin 10.1 (L) 12.0 - 15.0 g/dL   HCT 31.2 (L) 36.0 - 46.0 %   MCV 98.4 78.0 - 100.0 fL   MCH 31.9 26.0 - 34.0 pg   MCHC 32.4 30.0 - 36.0 g/dL   RDW 19.9 (H) 11.5 - 15.5 %   Platelets 231 150 - 400 K/uL  Basic metabolic panel     Status: Abnormal   Collection Time: 08/09/14  7:18 PM  Result Value Ref Range   Sodium 139 137 - 147 mEq/L   Potassium 3.5 (L) 3.7 - 5.3 mEq/L   Chloride 98 96 - 112 mEq/L   CO2 26 19 - 32 mEq/L   Glucose, Bld 161 (H) 70 - 99 mg/dL   BUN 22 6 - 23 mg/dL    Comment: DELTA CHECK NOTED DIALYSIS    Creatinine, Ser 2.42 (H) 0.50 - 1.10 mg/dL    Comment: DELTA CHECK NOTED DIALYSIS    Calcium 9.4 8.4 - 10.5 mg/dL   GFR calc non Af Amer 19 (L) >90 mL/min   GFR calc Af Amer 23 (L) >90 mL/min    Comment: (NOTE) The eGFR has been calculated using the CKD EPI equation. This calculation has not been validated in all clinical situations. eGFR's persistently <90 mL/min signify possible Chronic Kidney Disease.    Anion gap 15 5 - 15  Glucose, capillary     Status: Abnormal   Collection Time: 08/09/14  9:32  PM  Result Value Ref Range   Glucose-Capillary 202 (H) 70 - 99 mg/dL  Glucose, capillary     Status: Abnormal   Collection Time: 08/10/14  6:50 AM  Result Value Ref Range   Glucose-Capillary 180 (H) 70 - 99 mg/dL  Glucose, capillary     Status: Abnormal   Collection Time: 08/10/14 12:04 PM  Result Value Ref Range   Glucose-Capillary 100 (H) 70 - 99 mg/dL  Glucose, capillary     Status: Abnormal   Collection Time: 08/10/14  4:28 PM  Result Value Ref Range   Glucose-Capillary 204 (H) 70 - 99 mg/dL  Basic metabolic panel     Status: Abnormal   Collection Time: 08/10/14  7:20 PM  Result Value Ref Range   Sodium 139 137 - 147 mEq/L   Potassium 4.0 3.7 - 5.3 mEq/L   Chloride 97 96 - 112 mEq/L   CO2 25 19 - 32 mEq/L   Glucose, Bld 174 (H) 70 - 99 mg/dL   BUN 36 (H) 6 - 23 mg/dL  Comment: DELTA CHECK NOTED   Creatinine, Ser 4.03 (H) 0.50 - 1.10 mg/dL    Comment: DELTA CHECK NOTED   Calcium 9.5 8.4 - 10.5 mg/dL   GFR calc non Af Amer 10 (L) >90 mL/min   GFR calc Af Amer 12 (L) >90 mL/min    Comment: (NOTE) The eGFR has been calculated using the CKD EPI equation. This calculation has not been validated in all clinical situations. eGFR's persistently <90 mL/min signify possible Chronic Kidney Disease.    Anion gap 17 (H) 5 - 15  Glucose, capillary     Status: None   Collection Time: 08/10/14 11:07 PM  Result Value Ref Range   Glucose-Capillary 92 70 - 99 mg/dL  Glucose, capillary     Status: Abnormal   Collection Time: 08/11/14  6:59 AM  Result Value Ref Range   Glucose-Capillary 113 (H) 70 - 99 mg/dL  Glucose, capillary     Status: Abnormal   Collection Time: 08/11/14 12:04 PM  Result Value Ref Range   Glucose-Capillary 239 (H) 70 - 99 mg/dL  Renal function panel     Status: Abnormal   Collection Time: 08/11/14  3:47 PM  Result Value Ref Range   Sodium 137 137 - 147 mEq/L   Potassium 4.7 3.7 - 5.3 mEq/L   Chloride 97 96 - 112 mEq/L   CO2 24 19 - 32 mEq/L   Glucose, Bld  173 (H) 70 - 99 mg/dL   BUN 31 (H) 6 - 23 mg/dL   Creatinine, Ser 3.67 (H) 0.50 - 1.10 mg/dL   Calcium 9.7 8.4 - 10.5 mg/dL   Phosphorus 2.9 2.3 - 4.6 mg/dL   Albumin 3.2 (L) 3.5 - 5.2 g/dL   GFR calc non Af Amer 12 (L) >90 mL/min   GFR calc Af Amer 14 (L) >90 mL/min    Comment: (NOTE) The eGFR has been calculated using the CKD EPI equation. This calculation has not been validated in all clinical situations. eGFR's persistently <90 mL/min signify possible Chronic Kidney Disease.    Anion gap 16 (H) 5 - 15  CBC     Status: Abnormal   Collection Time: 08/11/14  3:47 PM  Result Value Ref Range   WBC 8.6 4.0 - 10.5 K/uL   RBC 3.36 (L) 3.87 - 5.11 MIL/uL   Hemoglobin 10.2 (L) 12.0 - 15.0 g/dL   HCT 32.9 (L) 36.0 - 46.0 %   MCV 97.9 78.0 - 100.0 fL   MCH 30.4 26.0 - 34.0 pg   MCHC 31.0 30.0 - 36.0 g/dL   RDW 19.7 (H) 11.5 - 15.5 %   Platelets 240 150 - 400 K/uL  Glucose, capillary     Status: Abnormal   Collection Time: 08/11/14  8:17 PM  Result Value Ref Range   Glucose-Capillary 69 (L) 70 - 99 mg/dL   Comment 1 Notify RN   Glucose, capillary     Status: Abnormal   Collection Time: 08/11/14  9:07 PM  Result Value Ref Range   Glucose-Capillary 109 (H) 70 - 99 mg/dL   Comment 1 Notify RN   Glucose, capillary     Status: Abnormal   Collection Time: 08/12/14  6:49 AM  Result Value Ref Range   Glucose-Capillary 148 (H) 70 - 99 mg/dL   Comment 1 Notify RN      HEENT: normal, eyes non injected, no drainage Cardio: RRR and no murmur Resp: CTA B/L and unlabored GI: BS positive and NT, ND Extremity:  Pulses   positive and trace LE Edema Skin:   Intact Neuro: Alert/Oriented. Improved trunk control Musc/Skel:  Swelling Left arm, with good thrill at AV graft site--non tender Gen NAD   Assessment/Plan: 1. Functional deficits secondary to Critical illness myopathy anoxic encepholopathy which require 3+ hours per day of interdisciplinary therapy in a comprehensive inpatient rehab  setting. Physiatrist is providing close team supervision and 24 hour management of active medical problems listed below. Physiatrist and rehab team continue to assess barriers to discharge/monitor patient progress toward functional and medical goals.  FIM: FIM - Bathing Bathing Steps Patient Completed: Chest, Right Arm, Left Arm, Abdomen, Right upper leg, Left upper leg, Front perineal area Bathing: 4: Min-Patient completes 8-9 [id number] parts or 75+ percent  FIM - Upper Body Dressing/Undressing Upper body dressing/undressing steps patient completed: Thread/unthread right bra strap, Thread/unthread left bra strap, Thread/unthread right sleeve of pullover shirt/dresss, Thread/unthread left sleeve of pullover shirt/dress, Put head through opening of pull over shirt/dress, Pull shirt over trunk Upper body dressing/undressing: 4: Min-Patient completed 75 plus % of tasks FIM - Lower Body Dressing/Undressing Lower body dressing/undressing steps patient completed: Thread/unthread left underwear leg Lower body dressing/undressing: 1: Total-Patient completed less than 25% of tasks  FIM - Toileting Toileting steps completed by patient: Performs perineal hygiene Toileting: 0: Activity did not occur  FIM - TRadio producerDevices: Bedside commode, Walker (BSC over toilet) Toilet Transfers: 3-To toilet/BSC: Mod A (lift or lower assist), 3-From toilet/BSC: Mod A (lift or lower assist)  FIM - BControl and instrumentation engineerDevices: Walker, Arm rests, Prosthesis Bed/Chair Transfer: 2: Bed > Chair or W/C: Max A (lift and lower assist), 2: Chair or W/C > Bed: Max A (lift and lower assist)  FIM - Locomotion: Wheelchair Distance: 75 Locomotion: Wheelchair: 2: Travels 50 - 149 ft with supervision, cueing or coaxing FIM - Locomotion: Ambulation Ambulation/Gait Assistance: Not tested (comment) Locomotion: Ambulation: 0: Activity did not  occur  Comprehension Comprehension Mode: Auditory Comprehension: 5-Follows basic conversation/direction: With extra time/assistive device  Expression Expression Mode: Verbal Expression: 5-Expresses basic needs/ideas: With extra time/assistive device  Social Interaction Social Interaction: 4-Interacts appropriately 75 - 89% of the time - Needs redirection for appropriate language or to initiate interaction.  Problem Solving Problem Solving: 3-Solves basic 50 - 74% of the time/requires cueing 25 - 49% of the time  Memory Memory: 2-Recognizes or recalls 25 - 49% of the time/requires cueing 51 - 75% of the time  Medical Problem List and Plan: 1. 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   2.  DVT Prophylaxis/Anticoagulation: Pharmaceutical:was on Lovenox, pt refusing  use SCDs 3. Pain Management: Prn oxycodone.   4. Mood: LCSW to follow for evaluation and support. Team to provide egosupport as well.   5. Neuropsych: This patient is capable of making decisions on her own behalf. 6. Skin/Wound Care: continue foam dressing to sacrum. Order Prevalon boot for right foot as well as elevation when in bed.   7. Fluids/Electrolytes/Nutrition: Strict I/O. 1200 FR due to ESRD. 8. ESRD: HD after therapy day. Nephrology to manage dialysis,               -AVG site non tender  9. CAD: metoprolol, ASA, monitor clinically with functional activities 10. A fib: rate controlled at present. Continue metoprolol   12. DM type II: sliding scale insulin. Check cbg's ac and hs             -  on amaryl at home which was resumed. Sugars generally improved--will need further titration as outpt 13.  Heme + stool, microscopic, monitor for frank red blood, likely small anal fissure or hemorrhoid plus lovenox, will monitor, transfused in HD, rec GI f/u as outpt  -serial H/H's (10.1 last  value improving) 14.  Visual blurring,resolved LOS (Days) 14 A FACE TO FACE EVALUATION WAS PERFORMED  SWARTZ,ZACHARY T 08/12/2014, 8:20 AM    

## 2014-08-12 NOTE — Plan of Care (Signed)
Problem: RH Swallowing Goal: LTG Patient will consume least restrictive PO diet (SLP) LTG: Patient will consume least restrictive PO diet with assist for use of compensatory strategies (SLP)  Outcome: Completed/Met Date Met:  08/12/14  Problem: RH Problem Solving Goal: LTG Patient will demonstrate problem solving for (SLP) LTG: Patient will demonstrate problem solving for basic/complex daily situations with cues (SLP)  Outcome: Completed/Met Date Met:  08/12/14  Problem: RH Memory Goal: LTG Patient will use memory compensatory aids to (SLP) LTG: Patient will use memory compensatory aids to recall biographical/new, daily complex information with cues (SLP)  Outcome: Completed/Met Date Met:  08/12/14  Problem: RH Awareness Goal: LTG: Patient will demonstrate intellectual/emergent (SLP) LTG: Patient will demonstrate intellectual/emergent/anticipatory awareness with assist during a cognitive/linguistic activity (SLP)  Outcome: Completed/Met Date Met:  08/12/14

## 2014-08-12 NOTE — Progress Notes (Signed)
Physical Therapy Session Note  Patient Details  Name: Briana Gould MRN: [id number] Date of Birth: 09/25/1945  Today's Date: 08/12/2014 PT Individual Time: 1035-1205 PT Individual Time Calculation (min): 90 min   Short Term Goals: Week 2:  PT Short Term Goal 1 (Week 2): = LTGs  Skilled Therapeutic Interventions/Progress Updates:  Tx focused on family training for [facility] and all aspects of functional mobility. Daughter, brother, and caregiver/friend present and engaged. Pt has prosthesis on today and was able to use it functionally for all transfers.   Reviewed and discussed all aspects of mobility in the home and transport <> HD x3/week. Family reports that pt spends majority of her time in [facility], typically transferring only to bed and toilet during the day, except when needing to perform car transfer for appointments   PT instructed family in [facility] parts management, including encouraging pt to perform all aspects that she is able to (brakes, arm rest management for sliding board transfers if needed).   Pt propelled WC x75' today in controlled setting with S and assist for leg rest.   Demonstrated stand-step transfer WC<>mat and WC<>car with family, educating them on positioning, safe pt handling, and sequence. Each caregiver return-demonstrated WC<>mat with up to Mod/Max A for lifting. Several times, pt needed multiple attempts to successfully stand. Pt was unable to successfully complete car transfer with RW due to high seat level (32"). She was able to perform this transfer at 25" but that is not sufficient for DC needs. Will continue to reattmpt and problem solve.   Pt assisted back to bed to rest. Discussed benefits of extending LOS a few days. Family is very appreciative as they are not able to care for her at current level, and pt sees progress to be made as well.      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

## 2014-08-13 ENCOUNTER — Inpatient Hospital Stay (HOSPITAL_COMMUNITY): Payer: Medicare Other

## 2014-08-13 ENCOUNTER — Inpatient Hospital Stay (HOSPITAL_COMMUNITY): Payer: Medicare Other | Admitting: *Deleted

## 2014-08-13 ENCOUNTER — Inpatient Hospital Stay (HOSPITAL_COMMUNITY): Payer: Medicare Other | Admitting: Physical Therapy

## 2014-08-13 DIAGNOSIS — I6782 Cerebral ischemia: Secondary | ICD-10-CM

## 2014-08-13 DIAGNOSIS — G931 Anoxic brain damage, not elsewhere classified: Secondary | ICD-10-CM

## 2014-08-13 LAB — GLUCOSE, CAPILLARY
GLUCOSE-CAPILLARY: 126 mg/dL — AB (ref 70–99)
GLUCOSE-CAPILLARY: 185 mg/dL — AB (ref 70–99)
GLUCOSE-CAPILLARY: 240 mg/dL — AB (ref 70–99)
Glucose-Capillary: 100 mg/dL — ABNORMAL HIGH (ref 70–99)

## 2014-08-13 NOTE — Progress Notes (Signed)
Occupational Therapy Session Note  Patient Details  Name: Briana Gould MRN: [id number] Date of Birth: October 15, 1945  Today's Date: 08/13/2014 OT Individual Time:  -   1400-1500  (60 min)      Short Term Goals: Week 1:  OT Short Term Goal 1 (Week 1): Pt would be able to transfer to and from BSC/ toliet with mod A  OT Short Term Goal 1 - Progress (Week 1): Not met OT Short Term Goal 2 (Week 1): Pt would tolerate prosethesis on during transfer training OT Short Term Goal 2 - Progress (Week 1): Not met OT Short Term Goal 3 (Week 1): Pt would be able to perform toielting after BM with mod A  OT Short Term Goal 3 - Progress (Week 1): Not met OT Short Term Goal 4 (Week 1): Pt will perform UB dressing with setup  OT Short Term Goal 4 - Progress (Week 1): Met Week 2:  OT Short Term Goal 1 (Week 2): Pt will complete slide board transfer to [facility] with mod A x 1. OT Short Term Goal 1 - Progress (Week 2): Progressing toward goal OT Short Term Goal 2 (Week 2): Pt will be able to don pants over both legs from supine position. OT Short Term Goal 2 - Progress (Week 2): Progressing toward goal OT Short Term Goal 3 (Week 2): Pt will be able to don underwear and/or pants over hips with min A by rolling in bed. OT Short Term Goal 3 - Progress (Week 2): Progressing toward goal OT Short Term Goal 4 (Week 2): Pt will be able to lateral lean to both sides on BSC to be able to perform hygiene. OT Short Term Goal 4 - Progress (Week 2): Not progressing Week 3:  OT Short Term Goal 1 (Week 3): STGs = LTGs  Skilled Therapeutic Interventions/Progress Updates:    Addressed functional exercises for BUE/BLE.  Pt needed frequent rest breaks for increased SOB as she engaged in exercises.  Pt. Attempted sit to stand from wc but endurance and fatique limited pt coming up to stand.  Pt. Did 4 sets of all exercises.  Left pt in wc with safety belt on and call bell,phone within reach.     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

## 2014-08-13 NOTE — Plan of Care (Signed)
Problem: RH BOWEL ELIMINATION Goal: RH STG MANAGE BOWEL WITH ASSISTANCE STG Manage Bowel with min Assistance.  Outcome: Progressing     

## 2014-08-13 NOTE — Plan of Care (Signed)
Problem: RH SAFETY Goal: RH STG ADHERE TO SAFETY PRECAUTIONS W/ASSISTANCE/DEVICE STG Adhere to Safety Precautions With min Assistance/Device.  Outcome: Progressing     

## 2014-08-13 NOTE — Plan of Care (Signed)
Problem: RH PAIN MANAGEMENT Goal: RH STG PAIN MANAGED AT OR BELOW PT'S PAIN GOAL Less than 3 out of 10  Outcome: Progressing     

## 2014-08-13 NOTE — Progress Notes (Signed)
Physical Therapy Session Note  Patient Details  Name: Briana Gould MRN: [id number] Date of Birth: October 25, 1945  Today's Date: 08/13/2014 PT Individual Time: 1312-1357 PT Individual Time Calculation (min): 45 min   Short Term Goals: Week 1:  PT Short Term Goal 1 (Week 1): Pt will demonstrate supine to sit transfer with SBA PT Short Term Goal 1 - Progress (Week 1): Met PT Short Term Goal 2 (Week 1): Pt will scoot transfer with SB req mod A.  PT Short Term Goal 2 - Progress (Week 1): Partly met PT Short Term Goal 3 (Week 1): Pt will self propel manual w/c x 100' req SBA.  PT Short Term Goal 3 - Progress (Week 1): Partly met PT Short Term Goal 4 (Week 1): Pt will initiate ambulate with PT during session.  PT Short Term Goal 4 - Progress (Week 1): Not met PT Short Term Goal 5 (Week 1): Pt will demonstrate sit to supine transfer with SBA.  PT Short Term Goal 5 - Progress (Week 1): Met  Skilled Therapeutic Interventions/Progress Updates:  Pt was seen bedside in the pm. Pt propelled w/c about 60 feet with B UEs and S with verbal cues. In gym treatment focused on NMRand LE strengthening. Pt donned L prosthesis with S to min guard and verbal cues. Pt performed multiple sit to stand transfers with rolling walker and min A initially progressing to mod A as fatigue increased. While standing worked on weight shifting and unilateral stance. Performed mini squats 3 sets x 10 reps each. Pt ambulated 8 feet with rolling walker and mod A with verbal cues. Pt returned to room following treatment and left sitting up in w/c.   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    Locomotion : Ambulation Ambulation/Gait Assistance: 3: Mod assist   See FIM for current functional status  Therapy/Group: Individual  Therapy  Dub Amis 08/13/2014, 3:05 PM

## 2014-08-13 NOTE — Progress Notes (Signed)
Computers are down, see written flow sheet for record of hemodialysis TX

## 2014-08-13 NOTE — Progress Notes (Signed)
Physical Therapy Session Note  Patient Details  Name: Briana Gould Dobrowski MRN: [id number] Date of Birth: Nov 11, 1945  Today's Date: 08/13/2014 PT Individual Time: 1015-1100 PT Individual Time Calculation (min): 45 min   Short Term Goals: Week 2:  PT Short Term Goal 1 (Week 2): = LTGs  Skilled Therapeutic Interventions/Progress Updates:  Edema noted RLE.  TED applied to RLE.  Pt often sits in w/c between therapies, with dependent LEs contributing to edema.  Recommended that pt sit in recliner with LEs elevated when resting.  W/c propulsion x 75' with distant supervision, using RLE, bil UEs until LUe fistula became uncomfortable rubbing on L armrest.  After OK by Darel HongJudy, RN, PT loosely wrapped ACE on LUE over fistula to prevent rubbing. W/c><25" high mat stand step transfer min assist using RW, LLE prosthesis; mat> w/c with mod assist due to poor eccentric control.  Cues for w/c set up, safe hand placement for stand> sit.    At home, pt sat in w/c without leg rests or rested in bed.  Pelvic dissociation/wt shifting in sitting with/without L prosthetic on.  Pt has difficulty recruiting L core muscles for this activity. Sit> L sidelying onto 25" high mat to simulate bed ht, supervision.   Pt attempted to don prosthesis in sitting on 25" mat to simulate home situation.  Pt unable to bear weight with residual limb enough to click into socket.  Pt unable to provide any ideas to improve this.  Pt provided with foot stool, and was able to don prosthesis quickly without any problem.  Pt recognized that a lower bed at home would facilitate transfers and donning prosthesis.  R hip extension from L sidelying positioning, x 10 with tactile cues.  Bil bridging with L residual limb on bolster, x 10 demonstrating pelvic tilt but unable to clear buttocks.  Supine > sit with supervision, cues to turn completely to face forward before attempting to stand.  Pt returned to room, resting in w/c, with all needs in place.       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

## 2014-08-13 NOTE — Progress Notes (Signed)
Briana Gould is a 68 y.o. female 02-23-1946 [id number]  Subjective: No new complaints. No new problems. Slept well. Feeling OK.  Objective: Vital signs in last 24 hours: Temp:  [98.6 F (37 C)-99.2 F (37.3 C)] 99.2 F (37.3 C) (12/05 0503) Pulse Rate:  [77-78] 77 (12/05 0503) Resp:  [19-20] 19 (12/05 0503) BP: (123-126)/(60-75) 123/60 mmHg (12/05 0503) SpO2:  [100 %] 100 % (12/05 0503) Weight:  [175 lb 7.8 oz (79.6 kg)-176 lb 9.4 oz (80.1 kg)] 175 lb 7.8 oz (79.6 kg) (12/05 0503) Weight change:  Last BM Date: 08/10/14  Intake/Output from previous day: 12/04 0701 - 12/05 0700 In: 440 [P.O.:440] Out: -  Last cbgs: CBG (last 3)   Recent Labs  08/12/14 2120 08/12/14 2300 08/13/14 0638  GLUCAP 49* 128* 126*     Physical Exam General: No apparent distress   HEENT: not dry Lungs: Normal effort. Lungs clear to auscultation, no crackles or wheezes. Cardiovascular: Regular rate and rhythm, no edema Abdomen: S/NT/ND; BS(+) Musculoskeletal:  unchanged Neurological: No new neurological deficits Wounds: N/A    Skin: clear  Aging changes Mental state: Alert, oriented, cooperative    Lab Results: BMET    Component Value Date/Time   NA 137 08/12/2014 1930   K 4.4 08/12/2014 1930   CL 95* 08/12/2014 1930   CO2 25 08/12/2014 1930   GLUCOSE 69* 08/12/2014 1930   BUN 32* 08/12/2014 1930   CREATININE 3.61* 08/12/2014 1930   CALCIUM 9.6 08/12/2014 1930   GFRNONAA 12* 08/12/2014 1930   GFRAA 14* 08/12/2014 1930   CBC    Component Value Date/Time   WBC 9.1 08/12/2014 1930   RBC 3.28* 08/12/2014 1930   RBC 3.30* 09/02/2010 0650   HGB 9.9* 08/12/2014 1930   HCT 31.9* 08/12/2014 1930   PLT 255 08/12/2014 1930   MCV 97.3 08/12/2014 1930   MCH 30.2 08/12/2014 1930   MCHC 31.0 08/12/2014 1930   RDW 19.4* 08/12/2014 1930   LYMPHSABS 1.6 07/13/2014 0620   MONOABS 0.7 07/13/2014 0620   EOSABS 0.2 07/13/2014 0620   BASOSABS 0.0 07/13/2014 0620    Studies/Results: No  results found.  Medications: I have reviewed the patient's current medications.  Assessment/Plan:  1. 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  2. DVT Prophylaxis/Anticoagulation: Pharmaceutical:was on Lovenox, pt refusing use SCDs 3. Pain Management: Prn oxycodone.  4. Mood: LCSW to follow for evaluation and support. Team to provide egosupport as well.  5. Neuropsych: This patient is capable of making decisions on her own behalf. 6. Skin/Wound Care: continue foam dressing to sacrum. Order Prevalon boot for right foot as well as elevation when in bed.  7. Fluids/Electrolytes/Nutrition: Strict I/O. 1200 FR due to ESRD. 8. ESRD: HD after therapy day. Nephrology to manage dialysis,  -AVG site non tender  9. CAD: metoprolol, ASA, monitor clinically with functional activities 10. A fib: rate controlled at present. Continue metoprolol  12. DM type II: sliding scale insulin. Check cbg's ac and hs -on amaryl at home which was resumed. Sugars generally improved--will need further titration as outpt 13. Heme + stool, microscopic, monitor for frank red blood, likely small anal fissure or hemorrhoid plus lovenox, will monitor, transfused in HD, rec GI f/u as outpt -serial H/H's (10.1 last value improving) 14. Visual blurring,resolved  Cont Rx    Length of stay, days: 15  Sonda PrimesAlex Bertha Earwood , MD  08/13/2014, 9:08 AM

## 2014-08-14 ENCOUNTER — Inpatient Hospital Stay (HOSPITAL_COMMUNITY): Payer: Medicare Other | Admitting: Physical Therapy

## 2014-08-14 ENCOUNTER — Inpatient Hospital Stay (HOSPITAL_COMMUNITY): Payer: Medicare Other | Admitting: *Deleted

## 2014-08-14 LAB — GLUCOSE, CAPILLARY
GLUCOSE-CAPILLARY: 217 mg/dL — AB (ref 70–99)
Glucose-Capillary: 148 mg/dL — ABNORMAL HIGH (ref 70–99)
Glucose-Capillary: 107 mg/dL — ABNORMAL HIGH (ref 70–99)
Glucose-Capillary: 142 mg/dL — ABNORMAL HIGH (ref 70–99)

## 2014-08-14 MED ORDER — METOPROLOL TARTRATE 12.5 MG HALF TABLET
12.5000 mg | ORAL_TABLET | ORAL | Status: DC
Start: 2014-08-14 — End: 2014-08-19
  Administered 2014-08-14 – 2014-08-18 (×2): 12.5 mg via ORAL
  Filled 2014-08-14 (×6): qty 1

## 2014-08-14 MED ORDER — MIDODRINE HCL 5 MG PO TABS
5.0000 mg | ORAL_TABLET | Freq: Two times a day (BID) | ORAL | Status: DC
  Administered 2014-08-14 – 2014-08-19 (×8): 5 mg via ORAL
  Filled 2014-08-14 (×14): qty 1

## 2014-08-14 NOTE — Progress Notes (Signed)
Briana Gould is a 68 y.o. female May 01, 1946 [id number]  Subjective:  No new problems. Slept well. Feeling OK.  Objective: Vital signs in last 24 hours: Temp:  [98.7 F (37.1 C)-98.8 F (37.1 C)] 98.7 F (37.1 C) (12/06 0521) Pulse Rate:  [68-77] 68 (12/06 0521) Resp:  [18] 18 (12/06 0521) BP: (104-141)/(58-63) 120/63 mmHg (12/06 0521) SpO2:  [99 %-100 %] 100 % (12/06 0521) Weight:  [176 lb 12.9 oz (80.2 kg)] 176 lb 12.9 oz (80.2 kg) (12/06 0521) Weight change: 3.5 oz (0.1 kg) Last BM Date: 08/12/14  Intake/Output from previous day: 12/05 0701 - 12/06 0700 In: 720 [P.O.:720] Out: -  Last cbgs: CBG (last 3)   Recent Labs  08/13/14 1631 08/13/14 2052 08/14/14 0717  GLUCAP 240* 100* 148*     Physical Exam General: No apparent distress   HEENT: not dry Lungs: Normal effort. Lungs clear to auscultation, no crackles or wheezes. Cardiovascular: Regular rate and rhythm, no edema Abdomen: S/NT/ND; BS(+) Musculoskeletal:  unchanged Neurological: No new neurological deficits Wounds: N/A    Skin: clear  Aging changes Mental state: Alert, oriented, cooperative    Lab Results: BMET    Component Value Date/Time   NA 137 08/12/2014 1930   K 4.4 08/12/2014 1930   CL 95* 08/12/2014 1930   CO2 25 08/12/2014 1930   GLUCOSE 69* 08/12/2014 1930   BUN 32* 08/12/2014 1930   CREATININE 3.61* 08/12/2014 1930   CALCIUM 9.6 08/12/2014 1930   GFRNONAA 12* 08/12/2014 1930   GFRAA 14* 08/12/2014 1930   CBC    Component Value Date/Time   WBC 9.1 08/12/2014 1930   RBC 3.28* 08/12/2014 1930   RBC 3.30* 09/02/2010 0650   HGB 9.9* 08/12/2014 1930   HCT 31.9* 08/12/2014 1930   PLT 255 08/12/2014 1930   MCV 97.3 08/12/2014 1930   MCH 30.2 08/12/2014 1930   MCHC 31.0 08/12/2014 1930   RDW 19.4* 08/12/2014 1930   LYMPHSABS 1.6 07/13/2014 0620   MONOABS 0.7 07/13/2014 0620   EOSABS 0.2 07/13/2014 0620   BASOSABS 0.0 07/13/2014 0620    Studies/Results: No results  found.  Medications: I have reviewed the patient's current medications.  Assessment/Plan:  1. 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  2. DVT Prophylaxis/Anticoagulation: Pharmaceutical:was on Lovenox, pt refusing use SCDs 3. Pain Management: Prn oxycodone.  4. Mood: LCSW to follow for evaluation and support. Team to provide egosupport as well.  5. Neuropsych: This patient is capable of making decisions on her own behalf. 6. Skin/Wound Care: continue foam dressing to sacrum. Order Prevalon boot for right foot as well as elevation when in bed.  7. Fluids/Electrolytes/Nutrition: Strict I/O. 1200 FR due to ESRD. 8. ESRD: HD after therapy day. Nephrology to manage dialysis,  -AVG site non tender  9. CAD: metoprolol, ASA, monitor clinically with functional activities 10. A fib: rate controlled at present. Continue metoprolol  12. DM type II: sliding scale insulin. Check cbg's ac and hs -on amaryl at home which was resumed. Sugars generally improved--will need further titration as outpt 13. Heme + stool, microscopic, monitor for frank red blood, likely small anal fissure or hemorrhoid plus lovenox, will monitor, transfused in HD, rec GI f/u as outpt -serial H/H's (10.1 last value improving) 14. Visual blurring,resolved  Cont current Rx    Length of stay, days: 16  Sonda PrimesAlex Plotnikov , MD 08/14/2014, 9:01  AM    

## 2014-08-14 NOTE — Progress Notes (Addendum)
Occupational Therapy Session Note  Patient Details  Name: Briana Gould MRN: [id number] Date of Birth: 01/03/1946  Today's Date: 08/14/2014 OT Individual Time:  -   0900-1000  (60 min)  1st session                                           1300-1400  (60 min)   2nd session      Short Term Goals: Week 1:  OT Short Term Goal 1 (Week 1): Pt would be able to transfer to and from BSC/ toliet with mod A  OT Short Term Goal 1 - Progress (Week 1): Not met OT Short Term Goal 2 (Week 1): Pt would tolerate prosethesis on during transfer training OT Short Term Goal 2 - Progress (Week 1): Not met OT Short Term Goal 3 (Week 1): Pt would be able to perform toielting after BM with mod A  OT Short Term Goal 3 - Progress (Week 1): Not met OT Short Term Goal 4 (Week 1): Pt will perform UB dressing with setup  OT Short Term Goal 4 - Progress (Week 1): Met Week 2:  OT Short Term Goal 1 (Week 2): Pt will complete slide board transfer to BSC with mod A x 1. OT Short Term Goal 1 - Progress (Week 2): Progressing toward goal OT Short Term Goal 2 (Week 2): Pt will be able to don pants over both legs from supine position. OT Short Term Goal 2 - Progress (Week 2): Progressing toward goal OT Short Term Goal 3 (Week 2): Pt will be able to don underwear and/or pants over hips with min A by rolling in bed. OT Short Term Goal 3 - Progress (Week 2): Progressing toward goal OT Short Term Goal 4 (Week 2): Pt will be able to lateral lean to both sides on BSC to be able to perform hygiene. OT Short Term Goal 4 - Progress (Week 2): Not progressing  Skilled Therapeutic Interventions/Progress Updates:    1st session;  . Pt began to bathe LB  and LB dressing at sink.  Provided intstructural  cues for LB dressing using energy conservation techniques and AE.  Pt. Did not follow instructions and did pants in a less energy conservative way despite verbal instruction.  She went from sit to stand with max assist to perform peri care  and don pants.  Pt required many rest breaks throughout and emphasized breathing techniques.  Pt did follow the breathing strategies.  Pt reported at the end that her bathing and dressing was backwards but she was unable to elaborate.  Pt did not respond to a lot of questions that were asked during session.  Pt. Had focused attention during session.  Left pt in wc with call bell,,phone within reach.    2nd session:  Time:  1300-1400  (60 min) Pain:none Individual session:  Pt. Sitting in wc upon OT arrival.  Donned right shoe with max assist using reaching and shoe horen.  Took increased time to perform.  Pt. Transferred to mat with max assist to lean body over feet.  Used push up blocks to engage triceps in pushing.  Used theraband and provided pt with verbal instructions to use theraband when not engaged in therapy.  Pt verbalized understanding.  Left pt in wc with call bell,phone within reach.      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  

## 2014-08-14 NOTE — Progress Notes (Addendum)
[facility] Progress Note   Subjective: no complaints, didn't sleep well last night  Filed Vitals:   08/13/14 0503 08/13/14 1516 08/13/14 2143 08/14/14 0521  BP: 123/60 104/58 141/60 120/63  Pulse: 77 77 73 68  Temp: 99.2 F (37.3 C) 98.8 F (37.1 C)  98.7 F (37.1 C)  TempSrc: Oral Oral  Oral  Resp: 19 18  18   Weight: 79.6 kg (175 lb 7.8 oz)   80.2 kg (176 lb 12.9 oz)  SpO2: 100% 99%  100%   Exam: Alert, up in WC No jvd RRR 1/6 SEM LSB Chest clear bilat Abd soft, NTND, no ascites Left BKA, RLE 2+ edema Neuro is nf, Ox 3 Left IJ cath and LUA AVF patent with firm hematoma present  HD: MWF [facility]  4h   75kg (dry wt unclear, never gets to dry wt, has limits on BP and UF)   2/2.0 Bath   Heparin 5000    LUA AVF / L IJ cath  350/A1.5  Profile 4  Limit UF to 3 L/ treatment Hectorol 2, Aranesp 150/wk  , Venofer 50 /wk       Assessment: 1. Critical illness myopathy - rehab started 11/20 2. Afib - not A/C candidate, on MTP for rate control 12.5 qid 3. ESRD on HD - max BFR 350 and max UF 3kg, keep BP over 120, on midodrine 5 tiw pre HD 4. HTN/ volume - down 86 > 80kg now. On MTP for CAD. Mod stable vol excess, improved from admit 5. 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 DM per primary 8. Left AVF hematoma/ infiltration - shuntogram 11/4 patent access. OK to use AVF per [facility] 9. Cardiac arrest - 3rd episode 10/19. Trying to avoid hypotension on HD for this reason.   10. Severe multivessel CAD - inoperable, on MTP 12.5 bid 11. Heme +stool - no frank bleed, s/p 2u prbc  Plan-  HD Monday, UF 2kg as tolerated. BP's down and HR controlled, will decrease MTP to 12.5 bid and increase midodrine to 5 mg bid.     Vinson Moselleob Briana Dingley MD  pager [phone]    cell [phone]  08/14/2014, 11:46 AM     Recent Labs Lab 08/08/14 2225  08/10/14 1920 08/11/14 1547 08/12/14 1930  NA 132*  < > 139 137 137   K 4.8  < > 4.0 4.7 4.4  CL 89*  < > 97 97 95*  CO2 23  < > 25 24 25   GLUCOSE 82  < > 174* 173* 69*  BUN 73*  < > 36* 31* 32*  CREATININE 6.54*  < > 4.03* 3.67* 3.61*  CALCIUM 9.6  < > 9.5 9.7 9.6  PHOS 3.7  --   --  2.9 2.5  < > = values in this interval not displayed.  Recent Labs Lab 08/08/14 2225 08/11/14 1547 08/12/14 1930  ALBUMIN 3.3* 3.2* 3.2*    Recent Labs Lab 08/09/14 1918 08/11/14 1547 08/12/14 1930  WBC 10.1 8.6 9.1  HGB 10.1* 10.2* 9.9*  HCT 31.2* 32.9* 31.9*  MCV 98.4 97.9 97.3  PLT 231 240 255   . sodium chloride   Intravenous Once  . sodium chloride   Intravenous Once  . aspirin EC  81 mg Oral Daily  . darbepoetin (ARANESP) injection - DIALYSIS  150 mcg Intravenous Q Wed-HD  . docusate sodium  100 mg Oral  BID  . doxercalciferol  5 mcg Intravenous Q M,W,F-HD  . feeding supplement (NEPRO CARB STEADY)  237 mL Oral TID WC  . ferric gluconate (FERRLECIT/NULECIT) IV  62.5 mg Intravenous Q Wed-HD  . glimepiride  2 mg Oral Q breakfast  . hydrocerin   Topical BID  . insulin aspart  0-15 Units Subcutaneous TID WC  . metoCLOPramide  10 mg Oral TID AC & HS  . metoprolol tartrate  12.5 mg Oral 4 times per day on Sun Tue Thu Sat  . midodrine  5 mg Oral Q M,W,F-HD  . multivitamin  1 tablet Oral QHS  . pantoprazole  40 mg Oral Daily  . polyethylene glycol  17 g Oral Daily  . sevelamer carbonate  800 mg Oral TID WC     acetaminophen, antiseptic oral rinse, diphenhydrAMINE, docusate sodium, ondansetron **OR** ondansetron (ZOFRAN) IV, oxyCODONE, sorbitol

## 2014-08-14 NOTE — Plan of Care (Signed)
Problem: RH SAFETY Goal: RH STG ADHERE TO SAFETY PRECAUTIONS W/ASSISTANCE/DEVICE STG Adhere to Safety Precautions With min Assistance/Device.  Outcome: Progressing Goal: RH STG DECREASED RISK OF FALL WITH ASSISTANCE STG Decreased Risk of Fall With min Assistance.  Outcome: Progressing  Problem: RH PAIN MANAGEMENT Goal: RH STG PAIN MANAGED AT OR BELOW PT'S PAIN GOAL Less than 3 out of 10  Outcome: Progressing     

## 2014-08-14 NOTE — Progress Notes (Signed)
Physical Therapy Session Note  Patient Details  Name: Briana Gould MRN: [id number] Date of Birth: Mar 09, 1946  Today's Date: 08/14/2014 PT Individual Time: 1500-1530 PT Individual Time Calculation (min): 30 min   Short Term Goals: Week 2:  PT Short Term Goal 1 (Week 2): = LTGs  Skilled Therapeutic Interventions/Progress Updates:   Pt received sitting in w/c, requires total cues and HOH assist to remove L leg rest in order to don prosthesis from w/c. Pt able to complete donning prosthesis in standing using RW, supervision. Pt propelled w/c using BUE and RLE x 25 ft with supervision and extra time. Pt performed stand pivot transfer using RW w/c > mat table with mod A and mat table > w/c with min A, requires assist for weight shifting, safe hand placement, blocking L foot from sliding forward, and management of RW as she tends to keep it far away from body and not move RW with her for transfers. Pt performed seated hip flexion and LAQ x 10 each LE. Pt performed multiple sit <> stand transfers from mat using RW with min A, verbal cues for hand placement for eccentric control. Pt with SOB with all transfers, requires verbal cues for breathing technique and increased time for mobility. Pt left sitting in w/c with all needs within reach.   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

## 2014-08-15 ENCOUNTER — Inpatient Hospital Stay (HOSPITAL_COMMUNITY): Payer: Medicare Other

## 2014-08-15 ENCOUNTER — Encounter (HOSPITAL_COMMUNITY): Payer: BC Managed Care – PPO

## 2014-08-15 ENCOUNTER — Inpatient Hospital Stay (HOSPITAL_COMMUNITY): Payer: Medicare Other | Admitting: Occupational Therapy

## 2014-08-15 LAB — CBC
HCT: 31.4 % — ABNORMAL LOW (ref 36.0–46.0)
Hemoglobin: 10 g/dL — ABNORMAL LOW (ref 12.0–15.0)
MCH: 30.4 pg (ref 26.0–34.0)
MCHC: 31.8 g/dL (ref 30.0–36.0)
MCV: 95.4 fL (ref 78.0–100.0)
Platelets: 300 10*3/uL (ref 150–400)
RBC: 3.29 MIL/uL — AB (ref 3.87–5.11)
RDW: 19.1 % — ABNORMAL HIGH (ref 11.5–15.5)
WBC: 9.2 10*3/uL (ref 4.0–10.5)

## 2014-08-15 LAB — RENAL FUNCTION PANEL
ALBUMIN: 3.1 g/dL — AB (ref 3.5–5.2)
ANION GAP: 20 — AB (ref 5–15)
BUN: 57 mg/dL — AB (ref 6–23)
CHLORIDE: 93 meq/L — AB (ref 96–112)
CO2: 20 mEq/L (ref 19–32)
Calcium: 9.7 mg/dL (ref 8.4–10.5)
Creatinine, Ser: 5.94 mg/dL — ABNORMAL HIGH (ref 0.50–1.10)
GFR calc Af Amer: 8 mL/min — ABNORMAL LOW (ref >90)
GFR calc non Af Amer: 7 mL/min — ABNORMAL LOW (ref >90)
Glucose, Bld: 203 mg/dL — ABNORMAL HIGH (ref 70–99)
POTASSIUM: 4.6 meq/L (ref 3.7–5.3)
Phosphorus: 3.4 mg/dL (ref 2.3–4.6)
Sodium: 133 mEq/L — ABNORMAL LOW (ref 137–147)

## 2014-08-15 LAB — GLUCOSE, CAPILLARY: Glucose-Capillary: 130 mg/dL — ABNORMAL HIGH (ref 70–99)

## 2014-08-15 MED ORDER — DOXERCALCIFEROL 4 MCG/2ML IV SOLN
INTRAVENOUS | Status: AC
  Filled 2014-08-15: qty 4

## 2014-08-15 MED ORDER — HEPARIN SODIUM (PORCINE) 1000 UNIT/ML DIALYSIS
5000.0000 [IU] | Freq: Once | INTRAMUSCULAR | Status: DC
  Filled 2014-08-15: qty 5

## 2014-08-15 MED ORDER — PENTAFLUOROPROP-TETRAFLUOROETH EX AERO: 1.0000 "application " | INHALATION_SPRAY | CUTANEOUS | Status: DC | PRN

## 2014-08-15 MED ORDER — ALTEPLASE 2 MG IJ SOLR
2.0000 mg | Freq: Once | INTRAMUSCULAR | Status: DC | PRN
  Filled 2014-08-15: qty 2

## 2014-08-15 MED ORDER — HEPARIN SODIUM (PORCINE) 1000 UNIT/ML DIALYSIS: 1000.0000 [IU] | INTRAMUSCULAR | Status: DC | PRN

## 2014-08-15 MED ORDER — LIDOCAINE HCL (PF) 1 % IJ SOLN: 5.0000 mL | INTRAMUSCULAR | Status: DC | PRN

## 2014-08-15 MED ORDER — NEPRO/CARBSTEADY PO LIQD
237.0000 mL | ORAL | Status: DC | PRN
  Filled 2014-08-15: qty 237

## 2014-08-15 MED ORDER — SODIUM CHLORIDE 0.9 % IV SOLN: 100.0000 mL | INTRAVENOUS | Status: DC | PRN

## 2014-08-15 MED ORDER — LIDOCAINE-PRILOCAINE 2.5-2.5 % EX CREA
1.0000 "application " | TOPICAL_CREAM | CUTANEOUS | Status: DC | PRN
  Filled 2014-08-15: qty 5

## 2014-08-15 MED ORDER — ACETAMINOPHEN 325 MG PO TABS
ORAL_TABLET | ORAL | Status: AC
  Administered 2014-08-15: 325 mg
  Filled 2014-08-15: qty 2

## 2014-08-15 NOTE — Progress Notes (Signed)
Occupational Therapy Session Note  Patient Details  Name: Briana Gould MRN: [id number] Date of Birth: 1946/08/24  Today's Date: 08/15/2014 OT Individual Time: 0905-1005 OT Individual Time Calculation (min): 60 min    Short Term Goals: Week 1:  OT Short Term Goal 1 (Week 1): Pt would be able to transfer to and from BSC/ toliet with mod A  OT Short Term Goal 1 - Progress (Week 1): Not met OT Short Term Goal 2 (Week 1): Pt would tolerate prosethesis on during transfer training OT Short Term Goal 2 - Progress (Week 1): Not met OT Short Term Goal 3 (Week 1): Pt would be able to perform toielting after BM with mod A  OT Short Term Goal 3 - Progress (Week 1): Not met OT Short Term Goal 4 (Week 1): Pt will perform UB dressing with setup  OT Short Term Goal 4 - Progress (Week 1): Met Week 2:  OT Short Term Goal 1 (Week 2): Pt will complete slide board transfer to [facility] with mod A x 1. OT Short Term Goal 1 - Progress (Week 2): Progressing toward goal OT Short Term Goal 2 (Week 2): Pt will be able to don pants over both legs from supine position. OT Short Term Goal 2 - Progress (Week 2): Progressing toward goal OT Short Term Goal 3 (Week 2): Pt will be able to don underwear and/or pants over hips with min A by rolling in bed. OT Short Term Goal 3 - Progress (Week 2): Progressing toward goal OT Short Term Goal 4 (Week 2): Pt will be able to lateral lean to both sides on BSC to be able to perform hygiene. OT Short Term Goal 4 - Progress (Week 2): Not progressing Week 3:  OT Short Term Goal 1 (Week 3): STGs = LTGs  Skilled Therapeutic Interventions/Progress Updates:      Pt seen for BADL retraining of bathing and dressing with a focus on functional mobility and activity tolerance, along with some family education. Her brother was present for the first part of the session and her caregiver observed parts of the session. Pt sitting at EOB, required mod A to stand to walker and then took 4-5 steps, then  requested w/c. Rested in chair then bathed and dressed UB from w/c at sink. She then took another 4 steps to a standard BSC over the toilet. Pt used BSC for bathing purposes as she did not need to toilet. She continues to need full A with clothing management. Pt provided with a toilet aid for her to use with a wash cloth to wash her backside. Pt did fairly well with this but needs more practice. She used a clothing hook to don underwear over R foot, but had difficulty with the pants. Barrier cream applied to open area on buttocks per RNs instructions. Pt then transferred back to her w/c. Pt did breathe heavily after each episode of work, but her breathing calmed quickly after a short rest break.  Her PT arrived for her next session.  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

## 2014-08-15 NOTE — Plan of Care (Signed)
Problem: RH Bed Mobility Goal: LTG Patient will perform bed mobility with assist (PT) LTG: Patient will perform bed mobility with assistance, with/without cues (PT).  Outcome: Completed/Met Date Met:  08/15/14

## 2014-08-15 NOTE — Consult Note (Signed)
  INITIAL DIAGNOSTIC EVALUATION - [facility]FIDENTIAL [facility]   MEDICAL NECESSITY:  Briana Gould [facility] for an initial diagnostic evaluation owing to the patient's diagnosis of critical illness myopathy.   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 She received CPR for 2 minutes with return of spontaneous circulation and was unresponsive w/ ineffective ventilation and posturing on arrival to ED. She was placed under the hypothermia protocol.On 06/29/2014 she was rewarmed and extubated but remained lethargic and nonverbal with concerns of anoxic encephalopathy. CT head negative. EEG done showing diffuse cerebral disturbance--non-specific."  Briana Gould was previously seen by this provider on 08/08/14. During that visit, she reported suffering from bereavement owing to the recent loss of her husband. We discussed setting her up with grief counseling upon discharge, and she was agreeable. I planned to follow-up with her throughout her admission for supportive psychotherapy. Given her present medical circumstances, and the possibility of cognitive impairment, I also recommended neuropsych testing upon discharge.    During today's visit, Briana Gould reported much improved mood. She attributed this to being able to be more active and mobile. She also reported improved cognitive function. She continues to believe she is making gains in therapy, though she still  feels that her schedule is "tight" meaning that she does not get much of a break in between therapies. She is discharging soon and is excited to return home. She still has her brother and one of her daughters to help her with this transition.   Of note, Briana Gould previously mentioned wanting to file a complaint about a particular staff member. Upon follow-up, she said this has been accomplished and she was happy with the outcome. No new complaints reported and she feels that the remainder of the rehab staff has been good.   PROCEDURES ADMINISTERED: [1 unit [id number] on 08/15/2014] Diagnostic clinical interview  Review of available records  Behavioral Evaluation: Briana Gould was appropriately dressed for season and situation, and she appeared tidy and well-groomed. Normal posture was noted. She was friendly and rapport was easily established. Her speech was as expected and she was able to express ideas effectively. Her affect was appropriately modulated and brighter. Attention and motivation were good.    IMPRESSION: Overall, Briana Gould appears to be doing quite well given her situation. However, I continued to encourage her to follow-up as an outpatient for grief counseling. She was agreeable with this recommendation and her social worker will provide her with resources. No formal neuropsychology follow-up will be scheduled for the remainder of her inpatient stay.  RECOMMENDATIONS  . Social worker to provide my contact information for the patient as needed. Social work also to provide info on grief counseling.  . Given the possibility of anoxia, cognitive testing as an outpatient would be valuable.   DIAGNOSES:  Critical illness myopathy Bereavement    Debbe MountsAdam T. Nakiea Metzner, Psy.D.  Clinical Neuropsychologist

## 2014-08-15 NOTE — Plan of Care (Signed)
Problem: RH BOWEL ELIMINATION Goal: RH STG MANAGE BOWEL WITH ASSISTANCE STG Manage Bowel with min Assistance.  Outcome: Progressing Goal: RH STG MANAGE BOWEL W/MEDICATION W/ASSISTANCE STG Manage Bowel with Medication with min Assistance.  Outcome: Progressing  Problem: RH SAFETY Goal: RH STG ADHERE TO SAFETY PRECAUTIONS W/ASSISTANCE/DEVICE STG Adhere to Safety Precautions With min Assistance/Device.  Outcome: Progressing Goal: RH STG DECREASED RISK OF FALL WITH ASSISTANCE STG Decreased Risk of Fall With min Assistance.  Outcome: Progressing     

## 2014-08-15 NOTE — Progress Notes (Signed)
Social Work [name], female   DOB: November 24, 1945, 68 y.o.   MRN: [id number] Spoke with MD he is agreeable to pt discharge changing to Friday, with more family education and progress with her prothesis.

## 2014-08-15 NOTE — Plan of Care (Signed)
Problem: RH COGNITION-NURSING Goal: RH STG ANTICIPATES NEEDS/CALLS FOR ASSIST W/ASSIST/CUES STG Anticipates Needs/Calls for Assist With Min Assistance/Cues.  Outcome: Progressing  Problem: RH PAIN MANAGEMENT Goal: RH STG PAIN MANAGED AT OR BELOW PT'S PAIN GOAL Less than 3 out of 10  Outcome: Progressing  Problem: RH KNOWLEDGE DEFICIT Goal: RH STG INCREASE KNOWLEDGE OF DIABETES Patient will verbalize management to diabetes to include diet, exercise, medications, and signs and symptoms of hypo- and hyper- glycemia with min assistance  Outcome: Progressing Goal: RH STG INCREASE KNOWLEDGE OF HYPERTENSION Patient will verbalize management of hypertension to include medications, diet, exercise, and how to take and read a blood pressure value with min assistance  Outcome: Progressing

## 2014-08-15 NOTE — Progress Notes (Addendum)
Physical Therapy Weekly Progress Note  Patient Details  Name: Briana Gould MRN: [id number] Date of Birth: 12-Jul-1946  Beginning of progress report period: August 09, 2014 End of progress report period: August 15, 2014  Today's Date: 08/15/2014 PT Individual Time: 1005-1035 ; 1305-1405  PT Individual Time Calculation (min): 30 min , 60 min Patient has met 2 of 7 long term goals, and is progressing in all other areas.  She has performed basic transfers with min assist inconsistently.  Gait LTGs were added back in today, as pt's L residual limb edema is reduced and prosthesis now fits.    Patient continues to demonstrate the following deficits: activity tolerance, strength, motor control, balance, functional mobility and locomotion, and therefore will continue to benefit from skilled PT intervention to enhance overall performance with activity tolerance, balance, postural control, ability to compensate for deficits and functional use of  left upper extremity and left lower extremity.  Patient progressing toward long term goals..  Plan of care revisions: addition of gait goals..  PT Short Term Goals  Week 2:  PT Short Term Goal 1 (Week 2): = LTGs    Skilled Therapeutic Interventions/Progress Updates:   Tx 1: Discussed bed height; family will check if bed is on casters, which could be removed.  Briana Gould plans to bring in [employer].  Family ed with brother Briana Gould, friend Briana Gould observed.  SPT wearing L prosthesis, using RW, with max VCs and min assist for forward wt shift.  Pt needs cues for appropriate set up of w/c to/from mat.  Bed mobility on mat at home bed height, 26", with supervision, with cue to remove prosthesis. Briana Gould return demonstrated with mod cues for best place to stand, set up, etc.  Pt donned prosthesis with assistance from [place], sitting on EOM. Discussed use of foot stool when donning prosthesis, if bed height cannot be lowered.  W/c propulsion x 100' using bil UEs  and RLE for activity tolerance. PT educated family on importance of pt continuing to push w/c and methods to increase efficiency.  Tx 2:  Pt donned prosthesis sitting in w/c with set-up.  She tends to attempt to push resideual limb into socket with too much knee flexion.  W/c propulsion for activity tolerance x 100' slowly, distant supervision.  Discussed with CSW pt's present w/c, which is not helpful for amputee seating/bil UE propulsion.  Sit> stand from w/c using RW, min guard assist, mod cues.  Gait x 20', x 2 with min guard assist, mod cues for upright trunk, forward gaze, wider BOS.  Scooting forward/backward on mat for transfer training, without use of UEs for core activation.  Pt demonstrates significant difficulty with L pelvic muscle activation.  Sit> stand from 23" high mat, min assist; mod assist to slow descent into w/c.  Pt resting in w/c in room, all needs in reach at end of session.  Pt' s w/c and cushion are showing wear.  She believes they are 59-12 years old.  She would benefit from a w/c cushion with less friction than her present lamb wool cover, and full length arm rests, firm seat and an amputee pad for support of residual limb.  PT informed CSW, and made appt with [employer] for Briana Gould to see pt Wed 12/ 9 at 1:00 PM.    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

## 2014-08-15 NOTE — Procedures (Signed)
Stable hemodynamics. Goal only 2000cc. C/o leg edema.  Will do HD again in AM. Laylia Mui C

## 2014-08-15 NOTE — Progress Notes (Signed)
Subjective:  Left eye blurred vision, but otherwise doing well, progressing with rehab, likely discharge on 12/11.  Objective: Vital signs in last 24 hours: Temp:  [98.6 F (37 C)-99.3 F (37.4 C)] 99.3 F (37.4 C) (12/07 0531) Pulse Rate:  [69-71] 69 (12/07 0531) Resp:  [16-18] 18 (12/07 0531) BP: (106-126)/(50-83) 126/50 mmHg (12/07 0531) SpO2:  [99 %-100 %] 100 % (12/07 0531) Weight:  [82 kg (180 lb 12.4 oz)] 82 kg (180 lb 12.4 oz) (12/07 0500) Weight change: 1.8 kg (3 lb 15.5 oz)  Intake/Output from previous day: 12/06 0701 - 12/07 0700 In: 960 [P.O.:960] Out: -  Intake/Output this shift: Total I/O In: 120 [P.O.:120] Out: -   Lab Results:  Recent Labs  08/12/14 1930  WBC 9.1  HGB 9.9*  HCT 31.9*  PLT 255   BMET:  Recent Labs  08/12/14 1930  NA 137  K 4.4  CL 95*  CO2 25  GLUCOSE 69*  BUN 32*  CREATININE 3.61*  CALCIUM 9.6  ALBUMIN 3.2*   No results for input(s): PTH in the last 72 hours. Iron Studies: No results for input(s): IRON, TIBC, TRANSFERRIN, FERRITIN in the last 72 hours.  Studies/Results: No results found.   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 Critical illness myopathy - rehab since 11/20, progressing. 2. A-fib - not a candidate for anticoagulation, HR controlled on Metoprolol 12.5 mg qd.  3. ESRD - HD on MWF @ [facility]. Keep BFR 350, 3 liters off max to minimize cardiac stress as much as possible, Midodrine pre-HD.  4. HTN/volume - BP  support with Midodrine; lowest wt so far 5 kgs over previous outpt EDW (10 L over after [facility]), but still with RLE edema.  5. Anemia - s/p PRBCs (1 U 11/24, 1 U 11/27), Hb up to 9.9. Aranesp at 150 mcg on Wed, TSat 29% on 11/22. 6. Metabolic bone disease -Ca 9.6 (10.4 corrected), P 2.5, PTH 769 (11/4); Hectorol, reduced Renvela to 1 with meals. 7. Nutrition - Alb 3.2, Nepro, renal diet.  8. LUA AVF hematoma/infiltration - now using. 9. Cardiac arrest - 3rd episode 10/19, avoid hypotension on HD. 10. Severe multivessel CAD - inoperable, on BB. 11. DM - per primary, A1c 6.5 (11/20)   LOS: 17 days   LYLES,CHARLES 08/15/2014,8:40 AM   Renal: She has improved remarkably.  She is concerned about transportation issues and the dialysis schedule.  [place] be [place] served [place] until her rehab is complete. I have advised her of this.  PT does not feel pt should be alone. [facility]. Jerik Falletta C

## 2014-08-15 NOTE — Plan of Care (Signed)
Problem: RH Balance Goal: LTG Patient will maintain dynamic sitting balance (PT) LTG: Patient will maintain dynamic sitting balance with assistance during mobility activities (PT)  Outcome: Completed/Met Date Met:  08/15/14

## 2014-08-15 NOTE — Progress Notes (Signed)
Subjective/Complaints: Pt speaking with Nephro PA, discussed extension to hopefully increase mobility to allow easier access to outpt HD center Still with some visual blurring Review of Systems - otherwise neg Objective: Vital Signs: Blood pressure 126/50, pulse 69, temperature 99.3 F (37.4 C), temperature source Oral, resp. rate 18, weight 82 kg (180 lb 12.4 oz), SpO2 100 %. No results found. Results for orders placed or performed during the hospital encounter of 07/29/14 (from the past 72 hour(s))  Glucose, capillary     Status: Abnormal   Collection Time: 08/12/14 12:01 PM  Result Value Ref Range   Glucose-Capillary 252 (H) 70 - 99 mg/dL   Comment 1 Notify RN   Glucose, capillary     Status: None   Collection Time: 08/12/14  4:18 PM  Result Value Ref Range   Glucose-Capillary 92 70 - 99 mg/dL  Renal function panel     Status: Abnormal   Collection Time: 08/12/14  7:30 PM  Result Value Ref Range   Sodium 137 137 - 147 mEq/L   Potassium 4.4 3.7 - 5.3 mEq/L   Chloride 95 (L) 96 - 112 mEq/L   CO2 25 19 - 32 mEq/L   Glucose, Bld 69 (L) 70 - 99 mg/dL   BUN 32 (H) 6 - 23 mg/dL   Creatinine, Ser 3.61 (H) 0.50 - 1.10 mg/dL   Calcium 9.6 8.4 - 10.5 mg/dL   Phosphorus 2.5 2.3 - 4.6 mg/dL   Albumin 3.2 (L) 3.5 - 5.2 g/dL   GFR calc non Af Amer 12 (L) >90 mL/min   GFR calc Af Amer 14 (L) >90 mL/min    Comment: (NOTE) The eGFR has been calculated using the CKD EPI equation. This calculation has not been validated in all clinical situations. eGFR's persistently <90 mL/min signify possible Chronic Kidney Disease.    Anion gap 17 (H) 5 - 15  CBC     Status: Abnormal   Collection Time: 08/12/14  7:30 PM  Result Value Ref Range   WBC 9.1 4.0 - 10.5 K/uL   RBC 3.28 (L) 3.87 - 5.11 MIL/uL   Hemoglobin 9.9 (L) 12.0 - 15.0 g/dL   HCT 31.9 (L) 36.0 - 46.0 %   MCV 97.3 78.0 - 100.0 fL   MCH 30.2 26.0 - 34.0 pg   MCHC 31.0 30.0 - 36.0 g/dL   RDW 19.4 (H) 11.5 - 15.5 %   Platelets  255 150 - 400 K/uL  Glucose, capillary     Status: Abnormal   Collection Time: 08/12/14  9:20 PM  Result Value Ref Range   Glucose-Capillary 49 (L) 70 - 99 mg/dL  Glucose, capillary     Status: Abnormal   Collection Time: 08/12/14 11:00 PM  Result Value Ref Range   Glucose-Capillary 128 (H) 70 - 99 mg/dL  Glucose, capillary     Status: Abnormal   Collection Time: 08/13/14  6:38 AM  Result Value Ref Range   Glucose-Capillary 126 (H) 70 - 99 mg/dL  Glucose, capillary     Status: Abnormal   Collection Time: 08/13/14 11:18 AM  Result Value Ref Range   Glucose-Capillary 185 (H) 70 - 99 mg/dL   Comment 1 Notify RN   Glucose, capillary     Status: Abnormal   Collection Time: 08/13/14  4:31 PM  Result Value Ref Range   Glucose-Capillary 240 (H) 70 - 99 mg/dL   Comment 1 Notify RN   Glucose, capillary     Status: Abnormal   Collection  Time: 08/13/14  8:52 PM  Result Value Ref Range   Glucose-Capillary 100 (H) 70 - 99 mg/dL  Glucose, capillary     Status: Abnormal   Collection Time: 08/14/14  7:17 AM  Result Value Ref Range   Glucose-Capillary 148 (H) 70 - 99 mg/dL  Glucose, capillary     Status: Abnormal   Collection Time: 08/14/14 11:27 AM  Result Value Ref Range   Glucose-Capillary 217 (H) 70 - 99 mg/dL  Glucose, capillary     Status: Abnormal   Collection Time: 08/14/14  4:10 PM  Result Value Ref Range   Glucose-Capillary 107 (H) 70 - 99 mg/dL  Glucose, capillary     Status: Abnormal   Collection Time: 08/14/14  8:46 PM  Result Value Ref Range   Glucose-Capillary 142 (H) 70 - 99 mg/dL   Comment 1 Notify RN   Glucose, capillary     Status: Abnormal   Collection Time: 08/15/14  6:49 AM  Result Value Ref Range   Glucose-Capillary 130 (H) 70 - 99 mg/dL   Comment 1 Notify RN      HEENT: normal, eyes non injected, no drainage Cardio: RRR and no murmur Resp: CTA B/L and unlabored GI: BS positive and NT, ND Extremity:  Pulses positive and trace LE Edema Skin:    Intact Neuro: Alert/Oriented. Improved trunk control Musc/Skel:  Swelling Left arm, with good thrill at AV graft site--non tender, stump non tender no edemaGen NAD   Assessment/Plan: 1. Functional deficits secondary to Critical illness myopathy anoxic encepholopathy which require 3+ hours per day of interdisciplinary therapy in a comprehensive inpatient rehab setting. Physiatrist is providing close team supervision and 24 hour management of active medical problems listed below. Physiatrist and rehab team continue to assess barriers to discharge/monitor patient progress toward functional and medical goals.  FIM: FIM - Bathing Bathing Steps Patient Completed: Chest, Right Arm, Left Arm, Front perineal area, Left upper leg, Abdomen, Right upper leg Bathing: 2: Max-Patient completes 3-4 85f10 parts or 25-49%  FIM - Upper Body Dressing/Undressing Upper body dressing/undressing steps patient completed: Thread/unthread right bra strap, Thread/unthread left bra strap, Thread/unthread right sleeve of pullover shirt/dresss, Thread/unthread left sleeve of pullover shirt/dress, Put head through opening of pull over shirt/dress, Pull shirt over trunk Upper body dressing/undressing: 4: Min-Patient completed 75 plus % of tasks FIM - Lower Body Dressing/Undressing Lower body dressing/undressing steps patient completed: Thread/unthread left underwear leg, Thread/unthread left pants leg Lower body dressing/undressing: 1: Total-Patient completed less than 25% of tasks  FIM - Toileting Toileting steps completed by patient: Performs perineal hygiene Toileting: 0: Activity did not occur  FIM - TRadio producerDevices: Bedside commode, Walker (BSC over toilet) Toilet Transfers: 0-Activity did not occur  FIM - BControl and instrumentation engineerDevices: Walker, Arm rests, Prosthesis Bed/Chair Transfer: 4: Chair or W/C > Bed: Min A (steadying Pt. > 75%), 3: Bed > Chair  or W/C: Mod A (lift or lower assist)  FIM - Locomotion: Wheelchair Distance: 25 Locomotion: Wheelchair: 1: Travels less than 50 ft with supervision, cueing or coaxing FIM - Locomotion: Ambulation Locomotion: Ambulation Assistive Devices: WAdministratorAmbulation/Gait Assistance: 3: Mod assist Locomotion: Ambulation: 0: Activity did not occur  Comprehension Comprehension Mode: Auditory Comprehension: 5-Follows basic conversation/direction: With extra time/assistive device  Expression Expression Mode: Verbal Expression: 4-Expresses basic 75 - 89% of the time/requires cueing 10 - 24% of the time. Needs helper to occlude trach/needs to repeat words.  Social Interaction Social Interaction:  4-Interacts appropriately 75 - 89% of the time - Needs redirection for appropriate language or to initiate interaction.  Problem Solving Problem Solving: 3-Solves basic 50 - 74% of the time/requires cueing 25 - 49% of the time  Memory Memory: 3-Recognizes or recalls 50 - 74% of the time/requires cueing 25 - 49% of the time  Medical Problem List and Plan: 1. 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   2.  DVT Prophylaxis/Anticoagulation: Pharmaceutical:was on Lovenox, pt refusing  use SCDs 3. Pain Management: Prn oxycodone.   4. Mood: LCSW to follow for evaluation and support. Team to provide egosupport as well.   5. Neuropsych: This patient is capable of making decisions on her own behalf. 6. Skin/Wound Care: continue foam dressing to sacrum. Order Prevalon boot for right foot as well as elevation when in bed.   7. Fluids/Electrolytes/Nutrition: Strict I/O. 1200 FR due to ESRD. 8. ESRD: HD after therapy day. Nephrology to manage dialysis,               -AVG site non tender  9. CAD: metoprolol, ASA, monitor clinically with functional activities 10. A fib: rate  controlled at present. Continue metoprolol   12. DM type II: sliding scale insulin. Check cbg's ac and hs             -on amaryl at home which was resumed. Sugars generally improved--will need further titration as outpt 13.  Heme + stool, microscopic, monitor for frank red blood, likely small anal fissure or hemorrhoid plus lovenox, will monitor, transfused in HD, rec GI f/u as outpt  -serial H/H's (9.9 last value stable)  LOS (Days) 17 A FACE TO FACE EVALUATION WAS PERFORMED  Eulamae Greenstein E 08/15/2014, 8:35 AM

## 2014-08-16 ENCOUNTER — Inpatient Hospital Stay (HOSPITAL_COMMUNITY): Payer: Medicare Other | Admitting: Occupational Therapy

## 2014-08-16 ENCOUNTER — Inpatient Hospital Stay (HOSPITAL_COMMUNITY): Payer: Medicare Other | Admitting: *Deleted

## 2014-08-16 DIAGNOSIS — D631 Anemia in chronic kidney disease: Secondary | ICD-10-CM

## 2014-08-16 DIAGNOSIS — I6782 Cerebral ischemia: Secondary | ICD-10-CM

## 2014-08-16 DIAGNOSIS — Z89512 Acquired absence of left leg below knee: Secondary | ICD-10-CM

## 2014-08-16 DIAGNOSIS — N186 End stage renal disease: Secondary | ICD-10-CM

## 2014-08-16 DIAGNOSIS — Z992 Dependence on renal dialysis: Secondary | ICD-10-CM

## 2014-08-16 DIAGNOSIS — G931 Anoxic brain damage, not elsewhere classified: Secondary | ICD-10-CM

## 2014-08-16 DIAGNOSIS — G7281 Critical illness myopathy: Secondary | ICD-10-CM

## 2014-08-16 DIAGNOSIS — N185 Chronic kidney disease, stage 5: Secondary | ICD-10-CM

## 2014-08-16 LAB — GLUCOSE, CAPILLARY
GLUCOSE-CAPILLARY: 179 mg/dL — AB (ref 70–99)
GLUCOSE-CAPILLARY: 150 mg/dL — AB (ref 70–99)
GLUCOSE-CAPILLARY: 105 mg/dL — AB (ref 70–99)
Glucose-Capillary: 108 mg/dL — ABNORMAL HIGH (ref 70–99)
Glucose-Capillary: 253 mg/dL — ABNORMAL HIGH (ref 70–99)
Glucose-Capillary: 257 mg/dL — ABNORMAL HIGH (ref 70–99)
Glucose-Capillary: 90 mg/dL (ref 70–99)

## 2014-08-16 NOTE — Plan of Care (Signed)
Problem: RH BOWEL ELIMINATION Goal: RH STG MANAGE BOWEL WITH ASSISTANCE STG Manage Bowel with min Assistance.  Outcome: Progressing Goal: RH STG MANAGE BOWEL W/MEDICATION W/ASSISTANCE STG Manage Bowel with Medication with min Assistance.  Outcome: Progressing  Problem: RH SAFETY Goal: RH STG ADHERE TO SAFETY PRECAUTIONS W/ASSISTANCE/DEVICE STG Adhere to Safety Precautions With min Assistance/Device.  Outcome: Progressing Goal: RH STG DECREASED RISK OF FALL WITH ASSISTANCE STG Decreased Risk of Fall With min Assistance.  Outcome: Progressing     

## 2014-08-16 NOTE — Progress Notes (Signed)
Occupational Therapy Session Note  Patient Details  Name: Briana Gould MRN: [id number] Date of Birth: [birth year]  Today's Date: 08/16/2014 OT Individual Time: 5929-2446 OT Individual Time Calculation (min): 60 min    Short Term Goals: Week 1:  OT Short Term Goal 1 (Week 1): Pt would be able to transfer to and from BSC/ toliet with mod A  OT Short Term Goal 1 - Progress (Week 1): Not met OT Short Term Goal 2 (Week 1): Pt would tolerate prosethesis on during transfer training OT Short Term Goal 2 - Progress (Week 1): Not met OT Short Term Goal 3 (Week 1): Pt would be able to perform toielting after BM with mod A  OT Short Term Goal 3 - Progress (Week 1): Not met OT Short Term Goal 4 (Week 1): Pt will perform UB dressing with setup  OT Short Term Goal 4 - Progress (Week 1): Met Week 2:  OT Short Term Goal 1 (Week 2): Pt will complete slide board transfer to [facility] with mod A x 1. OT Short Term Goal 1 - Progress (Week 2): Progressing toward goal OT Short Term Goal 2 (Week 2): Pt will be able to don pants over both legs from supine position. OT Short Term Goal 2 - Progress (Week 2): Progressing toward goal OT Short Term Goal 3 (Week 2): Pt will be able to don underwear and/or pants over hips with min A by rolling in bed. OT Short Term Goal 3 - Progress (Week 2): Progressing toward goal OT Short Term Goal 4 (Week 2): Pt will be able to lateral lean to both sides on BSC to be able to perform hygiene. OT Short Term Goal 4 - Progress (Week 2): Not progressing Week 3:  OT Short Term Goal 1 (Week 3): STGs = LTGs  Skilled Therapeutic Interventions/Progress Updates:      Pt seen for BADL retraining of toileting, bathing, and dressing with a focus on activity tolerance, balance, and functional mobility along with the use of AE.  Pt was able to don her prosthesis with no A or cues needed. Her R foot was bathed for her and TED hose applied to don shoe. Pt was able to take 15 steps from bed to door of  bathroom. She then sat in chair to complete UB self care at sink. Pt then ambulated another 10 steps from door of bathroom to [facility] over toilet with steady A. Pt needed to use the bathroom. Pt needed extra time to complete having a BM. She used toilet aid with paper and then with wash cloth to cleanse self in standing. She was only able to pull underwear up halfway after donning over feet with reacher. Pt was tired and needed to rest. Pt had not completed donning shirt or pants, but her PT assisted her as her PT session began at 905. [name] arrived at end of the session.  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

## 2014-08-16 NOTE — Progress Notes (Signed)
Physical Therapy Session Note  Patient Details  Name: Briana Gould MRN: [id number] Date of Birth: Apr 23, 1946  Today's Date: 08/16/2014 PT Individual Time: 0905-1005 and 11:35-12:05 (30min)  PT Individual Time Calculation (min): 60 min   Short Term Goals: Week 3:    = LTG  Skilled Therapeutic Interventions/Progress Updates:  First tx focused on functional mobility training, gait with RW, WC propulsion, and therex for strengthening. Brother present for training and needed continual cues to increase level of assist from close S>>min-guard for safety. This may be family/cultural pattern that will be hard to change. Prosthesis on today just fine.   Hand-off from OT in bathroom during B&D. Assisted dressing completion with Max A lower body, S upper body. Mod A transfers throughout for lifting and steadying during turns with RW. Pt needs safety cues throughout for hand placement.   Pt propelled WC 2x40' with S and cues for continuing to increase activity tolerance.   Gait 2x12' and 1x24' with RW and Min A for steadying. Pt continues to need cues for safe hand placement and technique.   Pt/family instructed in seated therex for increased strength and mobility including LAQ and marching with cues for technique. Handout provided.  Pt declined additional car transfer attempts this morning.   Second tx focused on car transfer attempt and additional seated therex instruction. [facility] asking PT to locate items in room. Pt encouraged to perform WC mobility around room to increase indep with locating items.   Instructed pt in car transfer with RW and prosthesis, but pt still unable to safely lift hips to height of her car (32"). Pt was able to perform at 28" with min A. Pt encouraged to help family problem solve additional ride options.   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

## 2014-08-16 NOTE — Progress Notes (Signed)
Subjective:  No complaints, feeling well, extra dialysis today  Objective: Vital signs in last 24 hours: Temp:  [97 F (36.1 C)-98.8 F (37.1 C)] 98.6 F (37 C) (12/08 0428) Pulse Rate:  [67-81] 81 (12/08 0428) Resp:  [16-20] 18 (12/08 0428) BP: (117-149)/(47-81) 149/75 mmHg (12/08 0428) SpO2:  [99 %-100 %] 100 % (12/08 0428) Weight:  [79 kg (174 lb 2.6 oz)-82.9 kg (182 lb 12.2 oz)] 81.6 kg (179 lb 14.3 oz) (12/08 0428) Weight change: 0.9 kg (1 lb 15.7 oz)  Intake/Output from previous day: 12/07 0701 - 12/08 0700 In: 480 [P.O.:480] Out: 1497  Intake/Output this shift:   Lab Results:  Recent Labs  08/15/14 1456  WBC 9.2  HGB 10.0*  HCT 31.4*  PLT 300   BMET:  Recent Labs  08/15/14 1456  NA 133*  K 4.6  CL 93*  CO2 20  GLUCOSE 203*  BUN 57*  CREATININE 5.94*  CALCIUM 9.7  ALBUMIN 3.1*   No results for input(s): PTH in the last 72 hours. Iron Studies: No results for input(s): IRON, TIBC, TRANSFERRIN, FERRITIN in the last 72 hours.  Studies/Results: No results found.   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 Critical illness myopathy - rehab since 11/20, progressing. 2. A-fib - not a candidate for anticoagulation, HR controlled on Metoprolol 12.5 mg qd.  3. ESRD - HD on MWF @ [facility]. Keep BFR 350, 3 liters off max to minimize cardiac stress as much as possible, Midodrine pre-HD, extra HD today.  4. HTN/volume- BP support with Midodrine 5 mg bid;  lowest wt so far 5 kgs over previous outpt EDW (10 L over after [facility]), but still with RLE edema s/p net UF 1.5 L yesterday.  5. Anemia - s/p PRBCs (1 U 11/24, 1 U 11/27), Hb up to 10, Aranesp at 150 mcg on Wed, TSat 29% on 11/22. 6. 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 7. Nutrition - Alb 3.1, Nepro, renal diet.  8. LUA AVF hematoma/infiltration - now using. 9. Cardiac arrest - 3rd episode 10/19, avoid hypotension on HD. 10. Severe multivessel CAD - inoperable, on BB. 11. DM - per primary, A1c 6.5 (11/20)   LOS: 18 days   LYLES,CHARLES 08/16/2014,9:22 AM  Renal Attending: Agree with note as articulated above.  We are trying to reduce EDW to optimize OP treatment. Casmer Yepiz C

## 2014-08-17 ENCOUNTER — Inpatient Hospital Stay (HOSPITAL_COMMUNITY): Payer: Medicare Other

## 2014-08-17 ENCOUNTER — Inpatient Hospital Stay (HOSPITAL_COMMUNITY): Payer: Medicare Other | Admitting: Occupational Therapy

## 2014-08-17 LAB — CBC
HCT: 32 % — ABNORMAL LOW (ref 36.0–46.0)
HEMOGLOBIN: 10.1 g/dL — AB (ref 12.0–15.0)
MCH: 31.3 pg (ref 26.0–34.0)
MCHC: 31.6 g/dL (ref 30.0–36.0)
MCV: 99.1 fL (ref 78.0–100.0)
Platelets: 270 10*3/uL (ref 150–400)
RBC: 3.23 MIL/uL — ABNORMAL LOW (ref 3.87–5.11)
RDW: 19.3 % — ABNORMAL HIGH (ref 11.5–15.5)
WBC: 7.5 10*3/uL (ref 4.0–10.5)

## 2014-08-17 LAB — RENAL FUNCTION PANEL
ANION GAP: 18 — AB (ref 5–15)
Albumin: 3.1 g/dL — ABNORMAL LOW (ref 3.5–5.2)
BUN: 31 mg/dL — ABNORMAL HIGH (ref 6–23)
CHLORIDE: 96 meq/L (ref 96–112)
CO2: 24 mEq/L (ref 19–32)
Calcium: 9.6 mg/dL (ref 8.4–10.5)
Creatinine, Ser: 3.8 mg/dL — ABNORMAL HIGH (ref 0.50–1.10)
GFR calc non Af Amer: 11 mL/min — ABNORMAL LOW (ref >90)
GFR, EST AFRICAN AMERICAN: 13 mL/min — AB (ref >90)
GLUCOSE: 161 mg/dL — AB (ref 70–99)
PHOSPHORUS: 2.7 mg/dL (ref 2.3–4.6)
POTASSIUM: 4 meq/L (ref 3.7–5.3)
Sodium: 138 mEq/L (ref 137–147)

## 2014-08-17 LAB — GLUCOSE, CAPILLARY
GLUCOSE-CAPILLARY: 191 mg/dL — AB (ref 70–99)
GLUCOSE-CAPILLARY: 102 mg/dL — AB (ref 70–99)
GLUCOSE-CAPILLARY: 111 mg/dL — AB (ref 70–99)
Glucose-Capillary: 95 mg/dL (ref 70–99)
Glucose-Capillary: 68 mg/dL — ABNORMAL LOW (ref 70–99)

## 2014-08-17 MED ORDER — ALTEPLASE 2 MG IJ SOLR
2.0000 mg | Freq: Once | INTRAMUSCULAR | Status: DC | PRN
  Filled 2014-08-17: qty 2

## 2014-08-17 MED ORDER — SODIUM CHLORIDE 0.9 % IV SOLN: 100.0000 mL | INTRAVENOUS | Status: DC | PRN

## 2014-08-17 MED ORDER — HEPARIN SODIUM (PORCINE) 1000 UNIT/ML DIALYSIS
1000.0000 [IU] | INTRAMUSCULAR | Status: DC | PRN
  Filled 2014-08-17: qty 1

## 2014-08-17 MED ORDER — DARBEPOETIN ALFA 150 MCG/0.3ML IJ SOSY
PREFILLED_SYRINGE | INTRAMUSCULAR | Status: AC
  Administered 2014-08-17: 16:00:00
  Filled 2014-08-17: qty 0.3

## 2014-08-17 MED ORDER — PENTAFLUOROPROP-TETRAFLUOROETH EX AERO: 1.0000 "application " | INHALATION_SPRAY | CUTANEOUS | Status: DC | PRN

## 2014-08-17 MED ORDER — ALTEPLASE 2 MG IJ SOLR
2.0000 mg | Freq: Once | INTRAMUSCULAR | Status: AC | PRN
  Filled 2014-08-17: qty 2

## 2014-08-17 MED ORDER — NEPRO/CARBSTEADY PO LIQD: 237.0000 mL | ORAL | Status: DC | PRN

## 2014-08-17 MED ORDER — LIDOCAINE-PRILOCAINE 2.5-2.5 % EX CREA
1.0000 "application " | TOPICAL_CREAM | CUTANEOUS | Status: DC | PRN
  Filled 2014-08-17: qty 5

## 2014-08-17 MED ORDER — MIDODRINE HCL 5 MG PO TABS
ORAL_TABLET | ORAL | Status: AC
  Administered 2014-08-17: 5 mg via ORAL
  Filled 2014-08-17: qty 1

## 2014-08-17 MED ORDER — NEPRO/CARBSTEADY PO LIQD
237.0000 mL | ORAL | Status: DC | PRN
  Filled 2014-08-17: qty 237

## 2014-08-17 MED ORDER — LIDOCAINE HCL (PF) 1 % IJ SOLN: 5.0000 mL | INTRAMUSCULAR | Status: DC | PRN

## 2014-08-17 MED ORDER — HEPARIN SODIUM (PORCINE) 1000 UNIT/ML DIALYSIS
20.0000 [IU]/kg | INTRAMUSCULAR | Status: DC | PRN
  Filled 2014-08-17: qty 2

## 2014-08-17 MED ORDER — DOXERCALCIFEROL 4 MCG/2ML IV SOLN
INTRAVENOUS | Status: AC
  Administered 2014-08-17: 5 ug via INTRAVENOUS
  Filled 2014-08-17: qty 4

## 2014-08-17 MED ORDER — HEPARIN SODIUM (PORCINE) 1000 UNIT/ML DIALYSIS
20.0000 [IU]/kg | INTRAMUSCULAR | Status: DC | PRN
  Administered 2014-08-17: 1700 [IU] via INTRAVENOUS_CENTRAL
  Filled 2014-08-17 (×2): qty 2

## 2014-08-17 NOTE — Progress Notes (Signed)
Physical Therapy Session Note  Patient Details  Name: Briana Barbaraann BoysS Chastain MRN: [id number] Date of Birth: 1945/11/27  Today's Date: 08/17/2014 PT Individual Time: [id number]; 1300-1410 PT Individual Time Calculation (min): 45 min , 70 min  Short Term Goals: Week 2:  PT Short Term Goal 1 (Week 2): = LTGs     Skilled Therapeutic Interventions/Progress Updates:   Tx 1:  Wearing L prosthesis: sit>stand repeatedly from raises mat, working on wt shifting forward, hand placement; stand> sit working on eccentric control, wt shifting.  Pt is fearful of falling, and does not lean forward enough for sit>< stand.   neuromuscular re-education via tactile and VCs for bil glut/quad activation for eccentric control stand> sit; pt is able to activate gluts in sitting, but not during stand> sit for this  Gait with RW x 20 ' including turn to R and slight turn to L, with min guard assist to stand and ambulate slowly; mod assist to control descent when sit.  Daughter [name] observed tx, and stated that pt did not typically wear her prosthesis when propelling w/c around house.  She is very familiar with pushing pt up/down ramp in w/c and will not need family ed for this.  Tx 2:  W/c evaluation with Minerva AreolaEric and Loraine LericheMark from [employer].  PT's brother Casimiro NeedleMichael and paid caregiver [facility] participated.    Gait with prosthesis as above, with addition of prosthetic sock.  Pt stated it was more comfortable.  Pt donned and doffed prosthesis with supervision.  With setup positioning from PT, pt was able to push residual limb further into socket in sitting, rather than depending on standing to get it fully into socket.  Sit>< stand and transfers with RW from various height seats to see which seat height is best for w/c propulsion as well as sit> stand. Pt required mod assist to stand up from seat +cushion height of 17".   Team eventually decided that pt's main power for w/c propulsion does come from bil UEs rather than RLE, so a seat height  to facilitate sit> stand is more important than seat height for propulsion.  Casimiro NeedleMichael performed one raised mat> w/c SPT to L with RW.  Casimiro NeedleMichael stands in front of pt rather than next to her. With one cue, he moves to the side for safer guarding as she steps.    Minerva Areolaric will be in touch after checking with Medicare as to age/rental status of pt's present w/c in order to proceed.  Pt bought present cushion with lambswool cover.   SPT w/c> bed in order to go to HD.  MIn assist SPT, mod VCS for reaching back to support surface and slow descent.     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

## 2014-08-17 NOTE — Progress Notes (Signed)
Subjective:  No complaints, right leg swelling improving  Objective: Vital signs in last 24 hours: Temp:  [97.8 F (36.6 C)-99.3 F (37.4 C)] 98.2 F (36.8 C) (12/09 0627) Pulse Rate:  [72-82] 77 (12/09 0627) Resp:  [14-20] 18 (12/09 0627) BP: (119-174)/(37-81) 157/70 mmHg (12/09 0627) SpO2:  [97 %-100 %] 100 % (12/09 0627) Weight:  [77.9 kg (171 lb 11.8 oz)-79.9 kg (176 lb 2.4 oz)] 77.9 kg (171 lb 11.8 oz) (12/09 0627) Weight change: -3 kg (-6 lb 9.8 oz)  Intake/Output from previous day: 12/08 0701 - 12/09 0700 In: 720 [P.O.:720] Out: 1913  Intake/Output this shift:   Lab Results:  Recent Labs  08/15/14 1456  WBC 9.2  HGB 10.0*  HCT 31.4*  PLT 300   BMET:  Recent Labs  08/15/14 1456  NA 133*  K 4.6  CL 93*  CO2 20  GLUCOSE 203*  BUN 57*  CREATININE 5.94*  CALCIUM 9.7  ALBUMIN 3.1*   No results for input(s): PTH in the last 72 hours. Iron Studies: No results for input(s): IRON, TIBC, TRANSFERRIN, FERRITIN in the last 72 hours.  Studies/Results: No results found.   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 Critical illness myopathy - rehab since 11/20, progressing. 2. A-fib - not a candidate for anticoagulation, HR controlled on Metoprolol 12.5 mg qd.  3. ESRD - HD on MWF @ [facility]. Keep BFR 350, 3 liters off max to minimize cardiac stress as much as possible, Midodrine pre-HD, daily HD this week.  4. HTN/volume- BP support with Midodrine 5 mg  bid; wt 77.9 kg s/p net UF 1.9 L yesterday (10 L over after [employer]), RLE edema improving.  5. Anemia - s/p PRBCs (1 U 11/24, 1 U 11/27), Hb up to 10, Aranesp at 150 mcg on Wed, TSat 29% on 11/22. 6. 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 7. Nutrition - Alb 3.1, Nepro, renal diet.  8. LUA AVF hematoma/infiltration - now using. 9. Cardiac arrest - 3rd episode 10/19, avoid hypotension on HD. 10. Severe multivessel CAD - inoperable, on BB. 11. DM - per primary, A1c 6.5 (11/20)    LOS: 19 days   LYLES,CHARLES 08/17/2014,8:26 AM   Renal Attending:  Agree with note as articulated above. Kiko Ripp C

## 2014-08-17 NOTE — Progress Notes (Signed)
Subjective/Complaints: No visual blurring Pt without new issues, daughter receiving caregiver traininag in gym today Review of Systems - otherwise neg Objective: Vital Signs: Blood pressure 157/70, pulse 77, temperature 98.2 F (36.8 C), temperature source Oral, resp. rate 18, weight 77.9 kg (171 lb 11.8 oz), SpO2 100 %. No results found. Results for orders placed or performed during the hospital encounter of 07/29/14 (from the past 72 hour(s))  Glucose, capillary     Status: Abnormal   Collection Time: 08/14/14 11:27 AM  Result Value Ref Range   Glucose-Capillary 217 (H) 70 - 99 mg/dL  Glucose, capillary     Status: Abnormal   Collection Time: 08/14/14  4:10 PM  Result Value Ref Range   Glucose-Capillary 107 (H) 70 - 99 mg/dL  Glucose, capillary     Status: Abnormal   Collection Time: 08/14/14  8:46 PM  Result Value Ref Range   Glucose-Capillary 142 (H) 70 - 99 mg/dL   Comment 1 Notify RN   Glucose, capillary     Status: Abnormal   Collection Time: 08/15/14  6:49 AM  Result Value Ref Range   Glucose-Capillary 130 (H) 70 - 99 mg/dL   Comment 1 Notify RN   Glucose, capillary     Status: Abnormal   Collection Time: 08/15/14 11:49 AM  Result Value Ref Range   Glucose-Capillary 179 (H) 70 - 99 mg/dL   Comment 1 Notify RN   Renal function panel     Status: Abnormal   Collection Time: 08/15/14  2:56 PM  Result Value Ref Range   Sodium 133 (L) 137 - 147 mEq/L   Potassium 4.6 3.7 - 5.3 mEq/L   Chloride 93 (L) 96 - 112 mEq/L   CO2 20 19 - 32 mEq/L   Glucose, Bld 203 (H) 70 - 99 mg/dL   BUN 57 (H) 6 - 23 mg/dL   Creatinine, Ser 5.94 (H) 0.50 - 1.10 mg/dL   Calcium 9.7 8.4 - 10.5 mg/dL   Phosphorus 3.4 2.3 - 4.6 mg/dL   Albumin 3.1 (L) 3.5 - 5.2 g/dL   GFR calc non Af Amer 7 (L) >90 mL/min   GFR calc Af Amer 8 (L) >90 mL/min    Comment: (NOTE) The eGFR has been calculated using the CKD EPI equation. This calculation has not been validated in all clinical situations. eGFR's  persistently <90 mL/min signify possible Chronic Kidney Disease.    Anion gap 20 (H) 5 - 15  CBC     Status: Abnormal   Collection Time: 08/15/14  2:56 PM  Result Value Ref Range   WBC 9.2 4.0 - 10.5 K/uL   RBC 3.29 (L) 3.87 - 5.11 MIL/uL   Hemoglobin 10.0 (L) 12.0 - 15.0 g/dL   HCT 31.4 (L) 36.0 - 46.0 %   MCV 95.4 78.0 - 100.0 fL   MCH 30.4 26.0 - 34.0 pg   MCHC 31.8 30.0 - 36.0 g/dL   RDW 19.1 (H) 11.5 - 15.5 %   Platelets 300 150 - 400 K/uL  Glucose, capillary     Status: Abnormal   Collection Time: 08/15/14  9:03 PM  Result Value Ref Range   Glucose-Capillary 108 (H) 70 - 99 mg/dL  Glucose, capillary     Status: Abnormal   Collection Time: 08/16/14  6:32 AM  Result Value Ref Range   Glucose-Capillary 150 (H) 70 - 99 mg/dL  Glucose, capillary     Status: Abnormal   Collection Time: 08/16/14 10:27 AM  Result Value  Ref Range   Glucose-Capillary 253 (H) 70 - 99 mg/dL   Comment 1 Notify RN   Glucose, capillary     Status: Abnormal   Collection Time: 08/16/14 11:33 AM  Result Value Ref Range   Glucose-Capillary 257 (H) 70 - 99 mg/dL  Glucose, capillary     Status: Abnormal   Collection Time: 08/16/14  4:24 PM  Result Value Ref Range   Glucose-Capillary 105 (H) 70 - 99 mg/dL  Glucose, capillary     Status: None   Collection Time: 08/16/14 10:55 PM  Result Value Ref Range   Glucose-Capillary 90 70 - 99 mg/dL  Glucose, capillary     Status: None   Collection Time: 08/17/14  6:59 AM  Result Value Ref Range   Glucose-Capillary 95 70 - 99 mg/dL   Comment 1 Notify RN      HEENT: normal, eyes non injected, no drainage Cardio: RRR and no murmur Resp: CTA B/L and unlabored GI: BS positive and NT, ND Extremity:  Pulses positive and trace LE Edema Skin:   Intact Neuro: Alert/Oriented. Improved trunk control Musc/Skel:  Swelling Left arm, with good thrill at AV graft site--non tender, stump non tender no edemaGen NAD   Assessment/Plan: 1. Functional deficits secondary to  Critical illness myopathy anoxic encepholopathy which require 3+ hours per day of interdisciplinary therapy in a comprehensive inpatient rehab setting. Physiatrist is providing close team supervision and 24 hour management of active medical problems listed below. Physiatrist and rehab team continue to assess barriers to discharge/monitor patient progress toward functional and medical goals. Team conference today please see physician documentation under team conference tab, met with team face-to-face to discuss problems,progress, and goals. Formulized individual treatment plan based on medical history, underlying problem and comorbidities. FIM: FIM - Bathing Bathing Steps Patient Completed: Chest, Right Arm, Left Arm, Front perineal area, Left upper leg, Abdomen, Right upper leg, Buttocks Bathing: 4: Min-Patient completes 8-9 36f10 parts or 75+ percent  FIM - Upper Body Dressing/Undressing Upper body dressing/undressing steps patient completed: Thread/unthread right bra strap, Thread/unthread left bra strap, Thread/unthread right sleeve of pullover shirt/dresss, Thread/unthread left sleeve of pullover shirt/dress, Put head through opening of pull over shirt/dress, Pull shirt over trunk Upper body dressing/undressing: 4: Min-Patient completed 75 plus % of tasks FIM - Lower Body Dressing/Undressing Lower body dressing/undressing steps patient completed: Thread/unthread left pants leg Lower body dressing/undressing: 2: Max-Patient completed 25-49% of tasks  FIM - Toileting Toileting steps completed by patient: Performs perineal hygiene (with toilet aid) Toileting: 2: Max-Patient completed 1 of 3 steps  FIM - TRadio producerDevices: Elevated toilet seat, WEnvironmental consultant Grab bars Toilet Transfers: 3-From toilet/BSC: Mod A (lift or lower assist)  FIM - BControl and instrumentation engineerDevices: Walker, Arm rests, Prosthesis Bed/Chair Transfer: 3: Bed > Chair or  W/C: Mod A (lift or lower assist), 3: Chair or W/C > Bed: Mod A (lift or lower assist)  FIM - Locomotion: Wheelchair Distance: 40 Locomotion: Wheelchair: 1: Travels less than 50 ft with supervision, cueing or coaxing FIM - Locomotion: Ambulation Locomotion: Ambulation Assistive Devices: WAdministratorAmbulation/Gait Assistance: 4: Min assist Locomotion: Ambulation: 1: Travels less than 50 ft with minimal assistance (Pt.>75%)  Comprehension Comprehension Mode: Auditory Comprehension: 5-Follows basic conversation/direction: With extra time/assistive device  Expression Expression Mode: Verbal Expression: 4-Expresses basic 75 - 89% of the time/requires cueing 10 - 24% of the time. Needs helper to occlude trach/needs to repeat words.  Social Interaction Social Interaction: 4-Interacts  appropriately 75 - 89% of the time - Needs redirection for appropriate language or to initiate interaction.  Problem Solving Problem Solving: 3-Solves basic 50 - 74% of the time/requires cueing 25 - 49% of the time  Memory Memory: 3-Recognizes or recalls 50 - 74% of the time/requires cueing 25 - 49% of the time  Medical Problem List and Plan: 1. 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   2.  DVT Prophylaxis/Anticoagulation: Pharmaceutical:was on Lovenox, pt refusing  use SCDs 3. Pain Management: Prn oxycodone.   4. Mood: LCSW to follow for evaluation and support. Team to provide egosupport as well.   5. Neuropsych: This patient is capable of making decisions on her own behalf. 6. Skin/Wound Care: continue foam dressing to sacrum. Order Prevalon boot for right foot as well as elevation when in bed.   7. Fluids/Electrolytes/Nutrition: Strict I/O. 1200 FR due to ESRD. 8. ESRD: HD after therapy day. Nephrology to manage dialysis,               -AVG site non tender  9.  CAD: metoprolol, ASA, monitor clinically with functional activities 10. A fib: rate controlled at present. Continue metoprolol   12. DM type II: sliding scale insulin. Check cbg's ac and hs             -on amaryl at home which was resumed. Sugars generally improved--will need further titration as outpt 13.  Heme + stool, microscopic, monitor for frank red blood, likely small anal fissure or hemorrhoid plus lovenox, will monitor, transfused in HD, rec GI f/u as outpt  -serial H/H's (9.9 last value stable)  LOS (Days) 19 A FACE TO FACE EVALUATION WAS PERFORMED  Christop Hippert E 08/17/2014, 9:21 AM

## 2014-08-17 NOTE — Plan of Care (Signed)
Problem: RH BOWEL ELIMINATION Goal: RH STG MANAGE BOWEL WITH ASSISTANCE STG Manage Bowel with min Assistance.  Outcome: Progressing Goal: RH STG MANAGE BOWEL W/MEDICATION W/ASSISTANCE STG Manage Bowel with Medication with min Assistance.  Outcome: Progressing  Problem: RH SAFETY Goal: RH STG ADHERE TO SAFETY PRECAUTIONS W/ASSISTANCE/DEVICE STG Adhere to Safety Precautions With min Assistance/Device.  Outcome: Progressing Goal: RH STG DECREASED RISK OF FALL WITH ASSISTANCE STG Decreased Risk of Fall With min Assistance.  Outcome: Progressing  Problem: RH COGNITION-NURSING Goal: RH STG USES MEMORY AIDS/STRATEGIES W/ASSIST TO PROBLEM SOLVE STG Uses Memory Aids/Strategies With Min Assistance to Problem Solve.  Outcome: Progressing Goal: RH STG ANTICIPATES NEEDS/CALLS FOR ASSIST W/ASSIST/CUES STG Anticipates Needs/Calls for Assist With Min Assistance/Cues.  Outcome: Progressing  Problem: RH PAIN MANAGEMENT Goal: RH STG PAIN MANAGED AT OR BELOW PT'S PAIN GOAL Less than 3 out of 10  Outcome: Progressing

## 2014-08-17 NOTE — Patient Care Conference (Signed)
Inpatient RehabilitationTeam Conference and Plan of Care Update Date: 08/17/2014   Time: 10;50 AM    Patient Name: Briana Gould Record Number: [id number]  Date of Birth: 1946-03-23 Sex: Female         Room/Bed: 4W04C/4W04C-01 Payor Info: Payor: MEDICARE / Plan: MEDICARE PART A AND B / Product Type: *No Product type* /    Admitting Diagnosis: Critical illness Myopathy  deconditioning   Admit Date/Time:  07/29/2014  4:46 PM Admission Comments: No comment available   Primary Diagnosis:  Critical illness myopathy Principal Problem: Critical illness myopathy  Patient Active Problem List   Diagnosis Date Noted  . Mild anoxic-ischemic encephalopathy 08/10/2014  . Critical illness myopathy 07/29/2014  . HCAP (healthcare-associated pneumonia) 12/12/2013  . Physical deconditioning 10/15/2013  . CVA (cerebral infarction) 10/01/2013  . Paroxysmal a-fib 10/01/2013  . Cardiac arrest 09/20/2013  . Acute respiratory failure with hypoxia 09/20/2013  . Hypertension 09/20/2013  . Encephalopathy acute 09/20/2013  . Bradycardia- intol to beta blocker 09/03/2013  . Anemia of chronic renal failure 08/30/2013  . Secondary hyperparathyroidism 08/30/2013  . GIB (gastrointestinal bleeding)--recurrent- not a candidate for Plavix 08/29/2013  . CAD- severe 3V CAD at cath March 2015- medical Rx 08/16/2013  . History of syncope 08/16/2013  . Aortic stenosis 08/16/2013  . 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 Cerebrovascular disease 08/15/2013  . Thrombocytopenia 08/14/2013  . Seizure 08/13/2013  . PAD (peripheral artery disease) 08/13/2013  . Weakness generalized 11/08/2011  . ESRD (end stage renal disease) on dialysis 11/04/2011  . MITRAL REGURGITATION 10/04/2010  . AORTIC STENOSIS 10/04/2010  . 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 HYPERLIPIDEMIA 09/13/2010  . OBESITY 09/13/2010  . Essential hypertension 09/13/2010  . PULMONARY HYPERTENSION 09/13/2010  . S/P  unilateral BKA (below knee amputation) 09/13/2010    Expected Discharge Date: Expected Discharge Date: 08/19/14  Team Members Present: Physician leading conference: Dr. Alysia Penna Social Worker Present: Ovidio Kin, LCSW Nurse Present: Rayetta Pigg, RN PT Present: Georjean Mode, PT;Blair Hobble, PT OT Present: Simonne Come, Dorothyann Gibbs, OT SLP Present: Windell Moulding, SLP PPS Coordinator present : Daiva Nakayama, RN, CRRN     Current Status/Progress Goal Weekly Team Focus  Medical   no new issues, extended for Functional reasons  home d/c  caregiver training   Bowel/Bladder   Continent of bowel, LBM 08/14/14, anuric cont B & B Continent of bowel cont bowel Offer bathroom PRN, assess for signs and symptoms of constipation    Swallow/Nutrition/ Hydration     na        ADL's   Bathing, UB dressing goals met.  Mod A with balance in standing and toilet transfer. Max A LB dressing with reacher and toileting with toilet aid.  min A bathing, toilet transfer, standing balance with RW, UB dressing; mod A LB dressing and toileting  ADL retraining, functional mobility, transfers, UE strengthening, pt/family education.   Mobility   Mod A transfers with RW, S WC porpulsion, Min A gait x24'  S bed mobility, Min A basic transfers, Mod A car/furniture transfers, S WC x100'  Family training, gait with RW, transfers - especially car, WC eval    Communication     na        Safety/Cognition/ Behavioral Observations    no unsafe behaviors        Pain   Denies  </= 3 on a 0-10 pain scale   Assess for pain qshift and  PRN   Skin   Right heel boggy, foam dressing and prevalon boot in use; healed stage II to sacrum  No new skin breakdown  Pressure relief to buttocks and right heel      *See Care Plan and progress notes for long and short-term goals.  Barriers to Discharge: see above    Possible Resolutions to Barriers:  D/C home today    Discharge Planning/Teaching Needs:  Family  education on-going, trying to figure out transportation.  Pt making more progress with prothesis      Team Discussion:  Making progress with prothesis and directing her care. Swolen right leg-HD MD pursuing. HD again today. On-going family education.  Wheelchair eval today.   Revisions to Treatment Plan:  Increased therapy to 3 hours   Continued Need for Acute Rehabilitation Level of Care: The patient requires daily medical management by a physician with specialized training in physical medicine and rehabilitation for the following conditions: Daily direction of a multidisciplinary physical rehabilitation program to ensure safe treatment while eliciting the highest outcome that is of practical value to the patient.: Yes Daily medical management of patient stability for increased activity during participation in an intensive rehabilitation regime.: Yes Daily analysis of laboratory values and/or radiology reports with any subsequent need for medication adjustment of medical intervention for : Other;Neurological problems  Elease Hashimoto 08/18/2014, 8:36 AM

## 2014-08-17 NOTE — Procedures (Signed)
Tolerating daily dialysis. Attempting to remove excess volume. Briana Gould C

## 2014-08-17 NOTE — Progress Notes (Signed)
Occupational Therapy Session Note  Patient Details  Name: Briana Gould MRN: [id number] Date of Birth: [birth year]  Today's Date: 08/17/2014 OT Individual Time: 1308-6578 OT Individual Time Calculation (min): 45 min    Short Term Goals: Week 1:  OT Short Term Goal 1 (Week 1): Pt would be able to transfer to and from BSC/ toliet with mod A  OT Short Term Goal 1 - Progress (Week 1): Not met OT Short Term Goal 2 (Week 1): Pt would tolerate prosethesis on during transfer training OT Short Term Goal 2 - Progress (Week 1): Not met OT Short Term Goal 3 (Week 1): Pt would be able to perform toielting after BM with mod A  OT Short Term Goal 3 - Progress (Week 1): Not met OT Short Term Goal 4 (Week 1): Pt will perform UB dressing with setup  OT Short Term Goal 4 - Progress (Week 1): Met Week 2:  OT Short Term Goal 1 (Week 2): Pt will complete slide board transfer to [facility] with mod A x 1. OT Short Term Goal 1 - Progress (Week 2): Progressing toward goal OT Short Term Goal 2 (Week 2): Pt will be able to don pants over both legs from supine position. OT Short Term Goal 2 - Progress (Week 2): Progressing toward goal OT Short Term Goal 3 (Week 2): Pt will be able to don underwear and/or pants over hips with min A by rolling in bed. OT Short Term Goal 3 - Progress (Week 2): Progressing toward goal OT Short Term Goal 4 (Week 2): Pt will be able to lateral lean to both sides on BSC to be able to perform hygiene. OT Short Term Goal 4 - Progress (Week 2): Not progressing Week 3:  OT Short Term Goal 1 (Week 3): STGs = LTGs  Skilled Therapeutic Interventions/Progress Updates:    Pt seen for BADL retraining of B/D at sink level with a focus on functional mobility and activity tolerance.  Pt was sitting EOB, her R foot was bathed and Ted hose and shoe applied. Pt donned her L prosthesis independently, then stood from bed with min A to her RW and ambulated to her w/c. Pt rested while nurse gave her medication. Pt  then bathed at sink, standing for over 5 minutes to wash LB area. Pt donned bra and underwear and skirt over feet with assist. Her therapy session had ended, so her nurse tech arrived to assist pt with standing and pulling clothing over her hips.  Pt is demonstrating improved mobility and activity tolerance.  Therapy Documentation Precautions:  Precautions Precautions: Fall Precaution Comments: old left BKA Restrictions Weight Bearing Restrictions: No Other Position/Activity Restrictions: old L BKA  Pain: Pain Assessment Pain Assessment: No/denies pain ADL: ADL ADL Comments: see FIM  See FIM for current functional status  Therapy/Group: Individual Therapy  [place] 08/17/2014, 11:00 AM

## 2014-08-17 NOTE — Significant Event (Signed)
Hypoglycemic Event  CBG: 68  Treatment: 15 GM carbohydrate snack  Symptoms: None  Follow-up CBG: Time:1922 CBG Result:102  Possible Reasons for Event: Other: patient returned from dialysis, had a late dinner tray  Comments/MD notified:patient eating dinner, will continue to monitor.    Scarlette SliceMack, Chace Klippel K  Remember to initiate Hypoglycemia Order Set & complete

## 2014-08-17 NOTE — Plan of Care (Signed)
Problem: RH KNOWLEDGE DEFICIT Goal: RH STG INCREASE KNOWLEDGE OF DYSPHAGIA/FLUID INTAKE Patient will verbalize and demonstrate proper swallowing techniques as specified by the speech therapist with min assistance  Outcome: Not Applicable Date Met:  26/33/35

## 2014-08-17 NOTE — Progress Notes (Signed)
Social Work [name], female   DOB: November 16, 1945, 68 y.o.   MRN: [id number] Met with pt and spoke with daughter-Dana to discuss team conference progression toward goals and questions or concerns. Both pleased with the progress pt has made and eel she will be ready to go on Friday 12/11.  Wheelchair eval today and should have Wheelchair by Friday.  Family working on transportation issue for HD.  On-going family education.

## 2014-08-18 ENCOUNTER — Inpatient Hospital Stay (HOSPITAL_COMMUNITY): Payer: Medicare Other | Admitting: Occupational Therapy

## 2014-08-18 ENCOUNTER — Inpatient Hospital Stay (HOSPITAL_COMMUNITY): Payer: Medicare Other

## 2014-08-18 ENCOUNTER — Encounter (HOSPITAL_COMMUNITY): Payer: Self-pay | Admitting: Cardiology

## 2014-08-18 LAB — RENAL FUNCTION PANEL
ALBUMIN: 3.3 g/dL — AB (ref 3.5–5.2)
Anion gap: 16 — ABNORMAL HIGH (ref 5–15)
BUN: 20 mg/dL (ref 6–23)
CHLORIDE: 93 meq/L — AB (ref 96–112)
CO2: 27 mEq/L (ref 19–32)
CREATININE: 2.62 mg/dL — AB (ref 0.50–1.10)
Calcium: 9.9 mg/dL (ref 8.4–10.5)
GFR calc Af Amer: 20 mL/min — ABNORMAL LOW (ref >90)
GFR calc non Af Amer: 18 mL/min — ABNORMAL LOW (ref >90)
GLUCOSE: 123 mg/dL — AB (ref 70–99)
POTASSIUM: 4 meq/L (ref 3.7–5.3)
Phosphorus: 2.9 mg/dL (ref 2.3–4.6)
Sodium: 136 mEq/L — ABNORMAL LOW (ref 137–147)

## 2014-08-18 LAB — CBC
HEMATOCRIT: 33.6 % — AB (ref 36.0–46.0)
HEMOGLOBIN: 10.6 g/dL — AB (ref 12.0–15.0)
MCH: 30.5 pg (ref 26.0–34.0)
MCHC: 31.5 g/dL (ref 30.0–36.0)
MCV: 96.6 fL (ref 78.0–100.0)
Platelets: 257 10*3/uL (ref 150–400)
RBC: 3.48 MIL/uL — ABNORMAL LOW (ref 3.87–5.11)
RDW: 19.2 % — AB (ref 11.5–15.5)
WBC: 7.7 10*3/uL (ref 4.0–10.5)

## 2014-08-18 LAB — GLUCOSE, CAPILLARY
GLUCOSE-CAPILLARY: 218 mg/dL — AB (ref 70–99)
GLUCOSE-CAPILLARY: 95 mg/dL (ref 70–99)
Glucose-Capillary: 133 mg/dL — ABNORMAL HIGH (ref 70–99)
Glucose-Capillary: 228 mg/dL — ABNORMAL HIGH (ref 70–99)

## 2014-08-18 MED ORDER — HEPARIN SODIUM (PORCINE) 1000 UNIT/ML DIALYSIS: 5000.0000 [IU] | Freq: Once | INTRAMUSCULAR | Status: DC

## 2014-08-18 MED ORDER — SODIUM CHLORIDE 0.9 % IV SOLN: 100.0000 mL | INTRAVENOUS | Status: DC | PRN

## 2014-08-18 MED ORDER — NEPRO/CARBSTEADY PO LIQD: 237.0000 mL | ORAL | Status: DC | PRN

## 2014-08-18 MED ORDER — HEPARIN SODIUM (PORCINE) 1000 UNIT/ML DIALYSIS: 1000.0000 [IU] | INTRAMUSCULAR | Status: DC | PRN

## 2014-08-18 MED ORDER — LIDOCAINE HCL (PF) 1 % IJ SOLN: 5.0000 mL | INTRAMUSCULAR | Status: DC | PRN

## 2014-08-18 MED ORDER — LIDOCAINE-PRILOCAINE 2.5-2.5 % EX CREA: 1.0000 "application " | TOPICAL_CREAM | CUTANEOUS | Status: DC | PRN

## 2014-08-18 MED ORDER — ALTEPLASE 2 MG IJ SOLR: 2.0000 mg | Freq: Once | INTRAMUSCULAR | Status: DC | PRN

## 2014-08-18 MED ORDER — PENTAFLUOROPROP-TETRAFLUOROETH EX AERO: 1.0000 "application " | INHALATION_SPRAY | CUTANEOUS | Status: DC | PRN

## 2014-08-18 NOTE — Plan of Care (Signed)
Problem: RH Balance Goal: LTG Patient will maintain dynamic standing with ADLs (OT) LTG: Patient will maintain dynamic standing balance with assist during activities of daily living (OT)  Outcome: Completed/Met Date Met:  08/18/14 Pt can stand with UE support with supervision, but needs min A with dynamic balance.  Problem: RH Grooming Goal: LTG Patient will perform grooming w/assist,cues/equip (OT) LTG: Patient will perform grooming with assist, with/without cues using equipment (OT)  Outcome: Completed/Met Date Met:  08/18/14  Problem: RH Bathing Goal: LTG Patient will bathe with assist, cues/equipment (OT) LTG: Patient will bathe specified number of body parts with assist with/without cues using equipment (position) (OT)  Outcome: Completed/Met Date Met:  08/18/14  Problem: RH Dressing Goal: LTG Patient will perform upper body dressing (OT) LTG Patient will perform upper body dressing with assist, with/without cues (OT).  Outcome: Completed/Met Date Met:  08/18/14 Goal: LTG Patient will perform lower body dressing w/assist (OT) LTG: Patient will perform lower body dressing with assist, with/without cues in positioning using equipment (OT)  Outcome: Adequate for Discharge  Problem: RH Toileting Goal: LTG Patient will perform toileting w/assist, cues/equip (OT) LTG: Patient will perform toiletiing (clothes management/hygiene) with assist, with/without cues using equipment (OT)  Outcome: Adequate for Discharge  Problem: RH Toilet Transfers Goal: LTG Patient will perform toilet transfers w/assist (OT) LTG: Patient will perform toilet transfers with assist, with/without cues using equipment (OT)  Outcome: Completed/Met Date Met:  08/18/14  Problem: RH Memory Goal: LTG Patient will demonstrate ability for day to day (OT) LTG: Patient will demonstrate ability for day to day recall/carryover during activities of daily living with assist (OT)  Outcome: Completed/Met Date Met:   08/18/14  Problem: RH Attention Goal: LTG Patient will demonstrate focused/sustained (OT) LTG: Patient will demonstrate focused/sustained/selective/alternating/divided attention during functional activities in specific environment with assist for # of minutes (OT)  Outcome: Completed/Met Date Met:  08/18/14  Problem: RH Awareness Goal: LTG: Patient will demonstrate intellectual/emergent (OT) LTG: Patient will demonstrate intellectual/emergent/anticipatory awareness with assist during a functional activity (OT)  Outcome: Completed/Met Date Met:  08/18/14

## 2014-08-18 NOTE — Plan of Care (Signed)
Problem: Consults Goal: Diabetes Guidelines if Diabetic/Glucose > 140 If diabetic or lab glucose is > 140 mg/dl - Initiate Diabetes/Hyperglycemia Guidelines & Document Interventions  Outcome: Progressing  Problem: RH BOWEL ELIMINATION Goal: RH STG MANAGE BOWEL WITH ASSISTANCE STG Manage Bowel with min Assistance.  Outcome: Progressing Goal: RH STG MANAGE BOWEL W/MEDICATION W/ASSISTANCE STG Manage Bowel with Medication with min Assistance.  Outcome: Progressing  Problem: RH SAFETY Goal: RH STG ADHERE TO SAFETY PRECAUTIONS W/ASSISTANCE/DEVICE STG Adhere to Safety Precautions With min Assistance/Device.  Outcome: Progressing Goal: RH STG DECREASED RISK OF FALL WITH ASSISTANCE STG Decreased Risk of Fall With min Assistance.  Outcome: Progressing  Problem: RH COGNITION-NURSING Goal: RH STG USES MEMORY AIDS/STRATEGIES W/ASSIST TO PROBLEM SOLVE STG Uses Memory Aids/Strategies With Min Assistance to Problem Solve.  Outcome: Progressing Goal: RH STG ANTICIPATES NEEDS/CALLS FOR ASSIST W/ASSIST/CUES STG Anticipates Needs/Calls for Assist With Min Assistance/Cues.  Outcome: Progressing  Problem: RH PAIN MANAGEMENT Goal: RH STG PAIN MANAGED AT OR BELOW PT'S PAIN GOAL Less than 3 out of 10  Outcome: Progressing  Problem: RH KNOWLEDGE DEFICIT Goal: RH STG INCREASE KNOWLEDGE OF DIABETES Patient will verbalize management to diabetes to include diet, exercise, medications, and signs and symptoms of hypo- and hyper- glycemia with min assistance  Outcome: Progressing Goal: RH STG INCREASE KNOWLEDGE OF HYPERTENSION Patient will verbalize management of hypertension to include medications, diet, exercise, and how to take and read a blood pressure value with min assistance  Outcome: Progressing

## 2014-08-18 NOTE — Plan of Care (Signed)
Problem: RH BOWEL ELIMINATION Goal: RH STG MANAGE BOWEL WITH ASSISTANCE STG Manage Bowel with min Assistance.  Outcome: Progressing Goal: RH STG MANAGE BOWEL W/MEDICATION W/ASSISTANCE STG Manage Bowel with Medication with min Assistance.  Outcome: Progressing  Problem: RH SAFETY Goal: RH STG ADHERE TO SAFETY PRECAUTIONS W/ASSISTANCE/DEVICE STG Adhere to Safety Precautions With min Assistance/Device.  Outcome: Progressing Goal: RH STG DECREASED RISK OF FALL WITH ASSISTANCE STG Decreased Risk of Fall With min Assistance.  Outcome: Progressing

## 2014-08-18 NOTE — Plan of Care (Signed)
Problem: RH Bed to Chair Transfers Goal: LTG Patient will perform bed/chair transfers w/assist (PT) LTG: Patient will perform bed/chair transfers with assistance, with/without cues (PT).  Outcome: Completed/Met Date Met:  08/18/14  Problem: RH Car Transfers Goal: LTG Patient will perform car transfers with assist (PT) LTG: Patient will perform car transfers with assistance (PT).  Outcome: Completed/Met Date Met:  08/18/14  Problem: RH Furniture Transfers Goal: LTG Patient will perform furniture transfers w/assist (OT/PT LTG: Patient will perform furniture transfers with assistance (OT/PT).  Outcome: Completed/Met Date Met:  08/18/14  Problem: RH Wheelchair Mobility Goal: LTG Patient will propel w/c in controlled environment (PT) LTG: Patient will propel wheelchair in controlled environment, # of feet with assist (PT)  Outcome: Completed/Met Date Met:  08/18/14 Goal: LTG Patient will propel w/c in home environment (PT) LTG: Patient will propel wheelchair in home environment, # of feet with assistance (PT).  Outcome: Completed/Met Date Met:  08/18/14  Problem: RH Ambulation Goal: LTG Patient will ambulate in controlled environment (PT) LTG: Patient will ambulate in a controlled environment, # of feet with assistance (PT).  Outcome: Completed/Met Date Met:  08/18/14 Goal: LTG Patient will ambulate in home environment (PT) LTG: Patient will ambulate in home environment, # of feet with assistance (PT).  Outcome: Completed/Met Date Met:  08/18/14

## 2014-08-18 NOTE — Progress Notes (Signed)
Occupational Therapy Session Note  Patient Details  Name: Briana Gould MRN: [id number] Date of Birth: 1945/10/06  Today's Date: 08/18/2014 OT Individual Treatment Time: 800-900 (60 min)    Short Term Goals: Week 1:  OT Short Term Goal 1 (Week 1): Pt would be able to transfer to and from BSC/ toliet with mod A  OT Short Term Goal 1 - Progress (Week 1): Not met OT Short Term Goal 2 (Week 1): Pt would tolerate prosethesis on during transfer training OT Short Term Goal 2 - Progress (Week 1): Not met OT Short Term Goal 3 (Week 1): Pt would be able to perform toielting after BM with mod A  OT Short Term Goal 3 - Progress (Week 1): Not met OT Short Term Goal 4 (Week 1): Pt will perform UB dressing with setup  OT Short Term Goal 4 - Progress (Week 1): Met Week 2:  OT Short Term Goal 1 (Week 2): Pt will complete slide board transfer to [facility] with mod A x 1. OT Short Term Goal 1 - Progress (Week 2): Progressing toward goal OT Short Term Goal 2 (Week 2): Pt will be able to don pants over both legs from supine position. OT Short Term Goal 2 - Progress (Week 2): Progressing toward goal OT Short Term Goal 3 (Week 2): Pt will be able to don underwear and/or pants over hips with min A by rolling in bed. OT Short Term Goal 3 - Progress (Week 2): Progressing toward goal OT Short Term Goal 4 (Week 2): Pt will be able to lateral lean to both sides on BSC to be able to perform hygiene. OT Short Term Goal 4 - Progress (Week 2): Not progressing Week 3:  OT Short Term Goal 1 (Week 3): STGs = LTGs  Skilled Therapeutic Interventions/Progress Updates:    Pt seen for BADL retraining to focus on activity tolerance and standing balance. Pt was sitting EOB and had completed breakfast. She bathed L leg and then donned prosthesis.  She was A with R leg, TEDS and shoe. Pt stood from EOB with min A and used RW to ambulated to w/c to bathe at sink. Pt continues to need min A to stand from w/c and BSC, but once she is  standing she is supervision. Pt uses a toilet aid with a wash cloth to bathe her bottom. She is able to wash front area as well. She continues to need cues with reacher to don pants over R foot and assist to pull pants over hips. Pt demonstrated improved activity tolerance with standing with no labored breathing.  [name] arrived at the end of the session. Reviewed with her caregiver and brother her progress, the level of A she needs now, the [facility] that will be ordered for her, toilet aid. Recommended they purchase a reacher. Pt and family should also inquire with her home dialysis team when she can shower.    Therapy Documentation Precautions:  Precautions Precautions: Fall Precaution Comments: old left BKA Restrictions Weight Bearing Restrictions: No Other Position/Activity Restrictions: old L BKA   Pain: Pain Assessment Pain Assessment: No/denies pain ADL: ADL ADL Comments: see FIM  See FIM for current functional status  Therapy/Group: Individual Therapy  Shanon Becvar 08/18/2014, 11:28 AM

## 2014-08-18 NOTE — Plan of Care (Signed)
Problem: RH Dressing Goal: LTG Patient will perform lower body dressing w/assist (OT) LTG: Patient will perform lower body dressing with assist, with/without cues in positioning using equipment (OT)  Outcome: Adequate for Discharge Pt's goal was mod A, but she continues to need max A due to difficulty reaching to foot, difficulty with using reacher, and difficulty with pulling pants over hips. Her caregiver can assist her at home.  Problem: RH Toileting Goal: LTG Patient will perform toileting w/assist, cues/equip (OT) LTG: Patient will perform toiletiing (clothes management/hygiene) with assist, with/without cues using equipment (OT)  Outcome: Adequate for Discharge Pt's goal was mod A, but she continues to need max A due to difficulty with pulling pants over hips. Her caregiver can assist her at home.

## 2014-08-18 NOTE — Progress Notes (Signed)
Social Work Elease Hashimoto, [place] Social Worker Signed  Patient Care Conference 08/17/2014  2:45 PM    Expand All Collapse All   Inpatient RehabilitationTeam Conference and Plan of Care Update Date: 08/17/2014   Time: 10;50 AM     Patient Name: Trudy Laura Record Number: [id number]  Date of Birth: 1946/03/20 Sex: Female         Room/Bed: 0N39J/6B34L-93 Payor Info: Payor: MEDICARE / Plan: MEDICARE PART A AND B / Product Type: *No Product type* /    Admitting Diagnosis: Critical illness Myopathy  deconditioning   Admit Date/Time:  07/29/2014  4:46 PM Admission Comments: No comment available   Primary Diagnosis:  Critical illness myopathy Principal Problem: Critical illness myopathy    Patient Active Problem List     Diagnosis  Date Noted   .  Mild anoxic-ischemic encephalopathy  08/10/2014   .  Critical illness myopathy  07/29/2014   .  HCAP (healthcare-associated pneumonia)  12/12/2013   .  Physical deconditioning  10/15/2013   .  CVA (cerebral infarction)  10/01/2013   .  Paroxysmal a-fib  10/01/2013   .  Cardiac arrest  09/20/2013   .  Acute respiratory failure with hypoxia  09/20/2013   .  Hypertension  09/20/2013   .  Encephalopathy acute  09/20/2013   .  Bradycardia- intol to beta blocker  09/03/2013   .  Anemia of chronic renal failure  08/30/2013   .  Secondary hyperparathyroidism  08/30/2013   .  GIB (gastrointestinal bleeding)--recurrent- not a candidate for Plavix  08/29/2013   .  CAD- severe 3V CAD at cath March 2015- medical Rx  08/16/2013   .  History of syncope  08/16/2013   .  Aortic stenosis  08/16/2013   .  R Jugular vein thrombosis  08/15/2013   .  Cerebrovascular disease  08/15/2013   .  Thrombocytopenia  08/14/2013   .  Seizure  08/13/2013   .  PAD (peripheral artery disease)  08/13/2013   .  Weakness generalized  11/08/2011   .  ESRD (end stage renal disease) on dialysis  11/04/2011   .  MITRAL REGURGITATION  10/04/2010   .  AORTIC  STENOSIS  10/04/2010   .  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  HYPERLIPIDEMIA  09/13/2010   .  OBESITY  09/13/2010   .  Essential hypertension  09/13/2010   .  PULMONARY HYPERTENSION  09/13/2010   .  S/P unilateral BKA (below knee amputation)  09/13/2010     Expected Discharge Date: Expected Discharge Date: 08/19/14  Team Members Present: Physician leading conference: Dr. Alysia Penna Social Worker Present: Ovidio Kin, LCSW Nurse Present: Rayetta Pigg, RN PT Present: Georjean Mode, PT;Blair Hobble, PT OT Present: Simonne Come, Dorothyann Gibbs, OT SLP Present: Windell Moulding, SLP PPS Coordinator present : Daiva Nakayama, RN, CRRN        Current Status/Progress  Goal  Weekly Team Focus   Medical     no new issues, extended for Functional reasons   home d/c  caregiver training   Bowel/Bladder     Continent of bowel, LBM 08/14/14, anuric cont B & B  Continent of bowel cont bowel  Offer bathroom PRN, assess for signs and symptoms of constipation    Swallow/Nutrition/ Hydration       na         ADL's     Bathing,  UB dressing goals met.  Mod A with balance in standing and toilet transfer. Max A LB dressing with reacher and toileting with toilet aid.  min A bathing, toilet transfer, standing balance with RW, UB dressing; mod A LB dressing and toileting  ADL retraining, functional mobility, transfers, UE strengthening, pt/family education.   Mobility     Mod A transfers with RW, S WC porpulsion, Min A gait x24'   S bed mobility, Min A basic transfers, Mod A car/furniture transfers, S WC x100'  Family training, gait with RW, transfers - especially car, WC eval    Communication       na         Safety/Cognition/ Behavioral Observations      no unsafe behaviors         Pain     Denies  </= 3 on a 0-10 pain scale   Assess for pain qshift and PRN   Skin     Right heel boggy, foam dressing and prevalon boot in use; healed stage II to sacrum  No new skin  breakdown  Pressure relief to buttocks and right heel       *See Care Plan and progress notes for long and short-term goals.    Barriers to Discharge:  see above     Possible Resolutions to Barriers:   D/C home today     Discharge Planning/Teaching Needs:   Family education on-going, trying to figure out transportation.  Pt making more progress with prothesis        Team Discussion:    Making progress with prothesis and directing her care. [place]olen right leg-HD MD pursuing. HD again today. On-going family education.  Wheelchair eval today.    Revisions to Treatment Plan:    Increased therapy to 3 hours    Continued Need for Acute Rehabilitation Level of Care: The patient requires daily medical management by a physician with specialized training in physical medicine and rehabilitation for the following conditions: Daily direction of a multidisciplinary physical rehabilitation program to ensure safe treatment while eliciting the highest outcome that is of practical value to the patient.: Yes Daily medical management of patient stability for increased activity during participation in an intensive rehabilitation regime.: Yes Daily analysis of laboratory values and/or radiology reports with any subsequent need for medication adjustment of medical intervention for : Other;Neurological problems  Elease Hashimoto 08/18/2014, 8:36 AM                 Elease Hashimoto, [place] Worker Signed  Patient Care Conference 08/10/2014  2:07 PM    Expand All Collapse All   Inpatient RehabilitationTeam Conference and Plan of Care Update Date: 08/10/2014   Time: 10;55 AM     Patient Name: Lurlean Ionia Record Number: [id number]  Date of Birth: 05-06-1946 Sex: Female         Room/Bed: 4W04C/4W04C-01 Payor Info: Payor: MEDICARE / Plan: MEDICARE PART A AND B / Product Type: *No Product type* /    Admitting Diagnosis: Critical illness Myopathy  deconditioning   Admit Date/Time:   07/29/2014  4:46 PM Admission Comments: No comment available   Primary Diagnosis:  Critical illness myopathy Principal Problem: Critical illness myopathy    Patient Active Problem List     Diagnosis  Date Noted   .  Mild anoxic-ischemic encephalopathy  08/10/2014   .  Critical illness myopathy  07/29/2014   .  HCAP (healthcare-associated pneumonia)  12/12/2013   .  Physical deconditioning  10/15/2013   .  CVA (cerebral infarction)  10/01/2013   .  Paroxysmal a-fib  10/01/2013   .  Cardiac arrest  09/20/2013   .  Acute respiratory failure with hypoxia  09/20/2013   .  Hypertension  09/20/2013   .  Encephalopathy acute  09/20/2013   .  Bradycardia- intol to beta blocker  09/03/2013   .  Anemia of chronic renal failure  08/30/2013   .  Secondary hyperparathyroidism  08/30/2013   .  GIB (gastrointestinal bleeding)--recurrent- not a candidate for Plavix  08/29/2013   .  CAD- severe 3V CAD at cath March 2015- medical Rx  08/16/2013   .  History of syncope  08/16/2013   .  Aortic stenosis  08/16/2013   .  R Jugular vein thrombosis  08/15/2013   .  Cerebrovascular disease  08/15/2013   .  Thrombocytopenia  08/14/2013   .  Seizure  08/13/2013   .  PAD (peripheral artery disease)  08/13/2013   .  Weakness generalized  11/08/2011   .  ESRD (end stage renal disease) on dialysis  11/04/2011   .  MITRAL REGURGITATION  10/04/2010   .  AORTIC STENOSIS  10/04/2010   .  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  HYPERLIPIDEMIA  09/13/2010   .  OBESITY  09/13/2010   .  Essential hypertension  09/13/2010   .  PULMONARY HYPERTENSION  09/13/2010   .  S/P unilateral BKA (below knee amputation)  09/13/2010     Expected Discharge Date: Expected Discharge Date: 08/16/14  Team Members Present: Physician leading conference: Dr. Alysia Penna Social Worker Present: Ovidio Kin, LCSW Nurse Present: Heather Roberts, RN PT Present: Georjean Mode, PT;Blair Hobble, PT SLP Present:  Windell Moulding, SLP PPS Coordinator present : Daiva Nakayama, RN, CRRN        Current Status/Progress  Goal  Weekly Team Focus   Medical     Therapy tolerance improving, end-stage renal on dialysis   Home discharge  Consider increasing to full  therapy schedule    Bowel/Bladder     Continent of bowel, LBM 08/10/14-patient performed manual disimpaction with assist of nurse, anuric  Continent of bowel  Offer bathroom PRN, assess for signs and symptoms of constipation   Swallow/Nutrition/ Hydration     Regular solids, thin liquids; upgraded to intermittent supervision   Mod I   continued diet tolerance    ADL's     no change from last week - max A with Slide board, mod bathing, total LB dressing, s/u UB self care  min A overall (LTGs modified from mod I last week, may need to be downgraded a 2nd time)  ADL retraining, functional mobility, transfers, UE strengthening, pt/family education, cognitive remediation   Mobility     S/min A bed mobility, Max A sliding board transfers, S WC propulsion x45,'   S bed mobility, Min A basic transfers, Mod A car/furniture transfers, S WC x100'  LE compression for prosthetic fit (anything dialysis can do to help with removing more fluid?), strengthening, activity tolerance, independence with transfers and [facility] management, family training?    Communication       na         Safety/Cognition/ Behavioral Observations    min assist   supervision   continue targeting awareness, functional problem solving, memory    Pain     Denies  </=3  Assess  for pain qshift and PRN   Skin     Stage I to right heel-boggy, allevyn and prevalon boot in use, healed stage II to sacrum  No new skin breakdown  Pressure relief to buttocks and right heel       *See Care Plan and progress notes for long and short-term goals.    Barriers to Discharge:  See above     Possible Resolutions to Barriers:   See above     Discharge Planning/Teaching Needs:   Will need to begin family  education with caregivers this week.  Pt plans to go home with the assistance fo various family members        Team Discussion:    Need to begin family education with caregivers-pt will require physical care. Vein mapping today-brother here a short time. Anemia improving.  Neuro-psych seeing and providing supportive counseling. Stage 2 on sacrum-RN addressing.   Revisions to Treatment Plan:    Downgraded goals to min level    Continued Need for Acute Rehabilitation Level of Care: The patient requires daily medical management by a physician with specialized training in physical medicine and rehabilitation for the following conditions: Daily direction of a multidisciplinary physical rehabilitation program to ensure safe treatment while eliciting the highest outcome that is of practical value to the patient.: Yes Daily medical management of patient stability for increased activity during participation in an intensive rehabilitation regime.: Yes Daily analysis of laboratory values and/or radiology reports with any subsequent need for medication adjustment of medical intervention for : Other  Elease Hashimoto 08/10/2014, 3:17 PM                 Elease Hashimoto, [place] Worker Signed  Patient Care Conference 08/03/2014  1:36 PM    Expand All Collapse All   Inpatient RehabilitationTeam Conference and Plan of Care Update Date: 08/03/2014   Time: 10;45 AM     Patient Name: Zamara Halawa Record Number: [id number]  Date of Birth: September 14, 1945 Sex: Female         Room/Bed: 4W04C/4W04C-01 Payor Info: Payor: MEDICARE / Plan: MEDICARE PART A AND B / Product Type: *No Product type* /    Admitting Diagnosis: Critical illness Myopathy  deconditioning   Admit Date/Time:  07/29/2014  4:46 PM Admission Comments: No comment available   Primary Diagnosis:  Critical illness myopathy Principal Problem: Critical illness myopathy    Patient Active Problem List     Diagnosis  Date Noted    .  Critical illness myopathy  07/29/2014   .  HCAP (healthcare-associated pneumonia)  12/12/2013   .  Physical deconditioning  10/15/2013   .  CVA (cerebral infarction)  10/01/2013   .  Paroxysmal a-fib  10/01/2013   .  Cardiac arrest  09/20/2013   .  Acute respiratory failure with hypoxia  09/20/2013   .  Hypertension  09/20/2013   .  Encephalopathy acute  09/20/2013   .  Bradycardia- intol to beta blocker  09/03/2013   .  Anemia of chronic renal failure  08/30/2013   .  Secondary hyperparathyroidism  08/30/2013   .  GIB (gastrointestinal bleeding)--recurrent- not a candidate for Plavix  08/29/2013   .  CAD- severe 3V CAD at cath March 2015- medical Rx  08/16/2013   .  History of syncope  08/16/2013   .  Aortic stenosis  08/16/2013   .  R Jugular vein thrombosis  08/15/2013   .  Cerebrovascular disease  08/15/2013   .  Thrombocytopenia  08/14/2013   .  Seizure  08/13/2013   .  PAD (peripheral artery disease)  08/13/2013   .  Weakness generalized  11/08/2011   .  ESRD (end stage renal disease) on dialysis  11/04/2011   .  MITRAL REGURGITATION  10/04/2010   .  AORTIC STENOSIS  10/04/2010   .  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  HYPERLIPIDEMIA  09/13/2010   .  OBESITY  09/13/2010   .  Essential hypertension  09/13/2010   .  PULMONARY HYPERTENSION  09/13/2010   .  Status post below knee amputation  09/13/2010     Expected Discharge Date: Expected Discharge Date: 08/16/14  Team Members Present: Physician leading conference: Dr. Alysia Penna Social Worker Present: Ovidio Kin, LCSW Nurse Present: Heather Roberts, RN PT Present: Raylene Everts, PT;Caroline Lacinda Axon, PT;Blair Hobble, PT OT Present: Blanchard Mane, OT SLP Present: Windell Moulding, SLP PPS Coordinator present : Daiva Nakayama, RN, CRRN        Current Status/Progress  Goal  Weekly Team Focus   Medical     Poor therapy tolerance,  anemia, end-stage renal on dialysis  Improve endurance  Decreased  therapy to 15/7   Bowel/Bladder     Continent of bowel, anuric  Continent of bowel with min assist   assesss for s/s of constipation q shift    Swallow/Nutrition/ Hydration     Regular solids, thin liquids; upgraded to intermittent supervision   Mod I  continued diet tolerance    ADL's     Pt unable to come into a stand even with total A x 2, total A toileting, max A to BSC with Slide board,  total A LB dressing, mod A bathing; s/u for UB self care  min A overall  ADL retraining, functional mobility, transfers, UE strengthening, pt/family education, cognitive remediation   Mobility     S bed mobility, Max A sliding board transfers, +2 sit<>stand, Min A short dist WC, no gait/stairs  Mod I bed mobility,  transfers, short distance gait, and WC prop x150'  strengthening, activity tolerance, transfer training, pre-gait, WC prop   Communication               Safety/Cognition/ Behavioral Observations    min assist   supervision   awareness, functional problem solving, memory    Pain     Patient reports occcasional headache relieved with tylenol   Pain less than or equal to 3 on a scale of 0-10  Assess for pain q4 hours and prn   Skin     Stage II to sacrum (healing) DTI to R heel (Prevalon boot in bed)  No new skin breakdown/injury, continued healing of current skin issues  Assess skin q shift and prn      *See Care Plan and progress notes for long and short-term goals.    Barriers to Discharge:  The above     Possible Resolutions to Barriers:   See above     Discharge Planning/Teaching Needs:   Home with daughter who is there in the evenings, pt will need to be mod/i level to return home.        Team Discussion:    Pt had blood transfusion yesterday and is not sleeping well.  15/7 poor endurance-have neuro-psych see. Need to confirm caregiver for home-if goals not upgraded.   Revisions to Treatment Plan:    Goals downgraded  to min assist level    Continued Need for Acute  Rehabilitation Level of Care: The patient requires daily medical management by a physician with specialized training in physical medicine and rehabilitation for the following conditions: Daily direction of a multidisciplinary physical rehabilitation program to ensure safe treatment while eliciting the highest outcome that is of practical value to the patient.: Yes Daily medical management of patient stability for increased activity during participation in an intensive rehabilitation regime.: Yes Daily analysis of laboratory values and/or radiology reports with any subsequent need for medication adjustment of medical intervention for : Other  Elease Hashimoto 08/03/2014, 2:34 PM                  Patient ID: Shizuye S Merkin, female   DOB: 1946/02/02, 68 y.o.   MRN: [id number]

## 2014-08-18 NOTE — Progress Notes (Signed)
Social Work [name], female   DOB: [birth year], 68 y.o.   MRN: [id number] Met with pt, brother and [name] to discuss transportation issues, pt needs a different slot at HD, she reports: " The slot they gave me 60;15 will not work with my grandchildren." This worker will call and see if can be switched to earlier, even by 30 minutes.  Informed pt this may not be possible.  Discussed private transport and [facility] [place]. Will try to figure out wheelchair before tomorrow.

## 2014-08-18 NOTE — Progress Notes (Signed)
Subjective/Complaints: No visual blurring Pt stated she had CBG of 59 this am, reviewed CBG 133 as well as 0605 BMET which showed gluc 123 Review of Systems - otherwise neg Objective: Vital Signs: Blood pressure 139/60, pulse 77, temperature 98.7 F (37.1 C), temperature source Oral, resp. rate 17, weight 79.7 kg (175 lb 11.3 oz), SpO2 99 %. No results found. Results for orders placed or performed during the hospital encounter of 07/29/14 (from the past 72 hour(s))  Glucose, capillary     Status: Abnormal   Collection Time: 08/15/14 11:49 AM  Result Value Ref Range   Glucose-Capillary 179 (H) 70 - 99 mg/dL   Comment 1 Notify RN   Renal function panel     Status: Abnormal   Collection Time: 08/15/14  2:56 PM  Result Value Ref Range   Sodium 133 (L) 137 - 147 mEq/L   Potassium 4.6 3.7 - 5.3 mEq/L   Chloride 93 (L) 96 - 112 mEq/L   CO2 20 19 - 32 mEq/L   Glucose, Bld 203 (H) 70 - 99 mg/dL   BUN 57 (H) 6 - 23 mg/dL   Creatinine, Ser 5.94 (H) 0.50 - 1.10 mg/dL   Calcium 9.7 8.4 - 10.5 mg/dL   Phosphorus 3.4 2.3 - 4.6 mg/dL   Albumin 3.1 (L) 3.5 - 5.2 g/dL   GFR calc non Af Amer 7 (L) >90 mL/min   GFR calc Af Amer 8 (L) >90 mL/min    Comment: (NOTE) The eGFR has been calculated using the CKD EPI equation. This calculation has not been validated in all clinical situations. eGFR's persistently <90 mL/min signify possible Chronic Kidney Disease.    Anion gap 20 (H) 5 - 15  CBC     Status: Abnormal   Collection Time: 08/15/14  2:56 PM  Result Value Ref Range   WBC 9.2 4.0 - 10.5 K/uL   RBC 3.29 (L) 3.87 - 5.11 MIL/uL   Hemoglobin 10.0 (L) 12.0 - 15.0 g/dL   HCT 31.4 (L) 36.0 - 46.0 %   MCV 95.4 78.0 - 100.0 fL   MCH 30.4 26.0 - 34.0 pg   MCHC 31.8 30.0 - 36.0 g/dL   RDW 19.1 (H) 11.5 - 15.5 %   Platelets 300 150 - 400 K/uL  Glucose, capillary     Status: Abnormal   Collection Time: 08/15/14  9:03 PM  Result Value Ref Range   Glucose-Capillary 108 (H) 70 - 99 mg/dL   Glucose, capillary     Status: Abnormal   Collection Time: 08/16/14  6:32 AM  Result Value Ref Range   Glucose-Capillary 150 (H) 70 - 99 mg/dL  Glucose, capillary     Status: Abnormal   Collection Time: 08/16/14 10:27 AM  Result Value Ref Range   Glucose-Capillary 253 (H) 70 - 99 mg/dL   Comment 1 Notify RN   Glucose, capillary     Status: Abnormal   Collection Time: 08/16/14 11:33 AM  Result Value Ref Range   Glucose-Capillary 257 (H) 70 - 99 mg/dL  Glucose, capillary     Status: Abnormal   Collection Time: 08/16/14  4:24 PM  Result Value Ref Range   Glucose-Capillary 105 (H) 70 - 99 mg/dL  Glucose, capillary     Status: None   Collection Time: 08/16/14 10:55 PM  Result Value Ref Range   Glucose-Capillary 90 70 - 99 mg/dL  Glucose, capillary     Status: None   Collection Time: 08/17/14  6:59 AM  Result  Value Ref Range   Glucose-Capillary 95 70 - 99 mg/dL   Comment 1 Notify RN   Glucose, capillary     Status: Abnormal   Collection Time: 08/17/14 11:59 AM  Result Value Ref Range   Glucose-Capillary 191 (H) 70 - 99 mg/dL  CBC     Status: Abnormal   Collection Time: 08/17/14  2:00 PM  Result Value Ref Range   WBC 7.5 4.0 - 10.5 K/uL   RBC 3.23 (L) 3.87 - 5.11 MIL/uL   Hemoglobin 10.1 (L) 12.0 - 15.0 g/dL   HCT 32.0 (L) 36.0 - 46.0 %   MCV 99.1 78.0 - 100.0 fL   MCH 31.3 26.0 - 34.0 pg   MCHC 31.6 30.0 - 36.0 g/dL   RDW 19.3 (H) 11.5 - 15.5 %   Platelets 270 150 - 400 K/uL  Renal function panel     Status: Abnormal   Collection Time: 08/17/14  2:00 PM  Result Value Ref Range   Sodium 138 137 - 147 mEq/L   Potassium 4.0 3.7 - 5.3 mEq/L   Chloride 96 96 - 112 mEq/L   CO2 24 19 - 32 mEq/L   Glucose, Bld 161 (H) 70 - 99 mg/dL   BUN 31 (H) 6 - 23 mg/dL    Comment: DELTA CHECK NOTED   Creatinine, Ser 3.80 (H) 0.50 - 1.10 mg/dL    Comment: DELTA CHECK NOTED   Calcium 9.6 8.4 - 10.5 mg/dL   Phosphorus 2.7 2.3 - 4.6 mg/dL   Albumin 3.1 (L) 3.5 - 5.2 g/dL   GFR calc non  Af Amer 11 (L) >90 mL/min   GFR calc Af Amer 13 (L) >90 mL/min    Comment: (NOTE) The eGFR has been calculated using the CKD EPI equation. This calculation has not been validated in all clinical situations. eGFR's persistently <90 mL/min signify possible Chronic Kidney Disease.    Anion gap 18 (H) 5 - 15  Glucose, capillary     Status: Abnormal   Collection Time: 08/17/14  6:46 PM  Result Value Ref Range   Glucose-Capillary 68 (L) 70 - 99 mg/dL   Comment 1 Notify RN   Glucose, capillary     Status: Abnormal   Collection Time: 08/17/14  7:22 PM  Result Value Ref Range   Glucose-Capillary 102 (H) 70 - 99 mg/dL  Glucose, capillary     Status: Abnormal   Collection Time: 08/17/14  8:51 PM  Result Value Ref Range   Glucose-Capillary 111 (H) 70 - 99 mg/dL  CBC     Status: Abnormal   Collection Time: 08/18/14  6:05 AM  Result Value Ref Range   WBC 7.7 4.0 - 10.5 K/uL   RBC 3.48 (L) 3.87 - 5.11 MIL/uL   Hemoglobin 10.6 (L) 12.0 - 15.0 g/dL   HCT 33.6 (L) 36.0 - 46.0 %   MCV 96.6 78.0 - 100.0 fL   MCH 30.5 26.0 - 34.0 pg   MCHC 31.5 30.0 - 36.0 g/dL   RDW 19.2 (H) 11.5 - 15.5 %   Platelets 257 150 - 400 K/uL  Renal function panel     Status: Abnormal   Collection Time: 08/18/14  6:05 AM  Result Value Ref Range   Sodium 136 (L) 137 - 147 mEq/L   Potassium 4.0 3.7 - 5.3 mEq/L   Chloride 93 (L) 96 - 112 mEq/L   CO2 27 19 - 32 mEq/L   Glucose, Bld 123 (H) 70 - 99 mg/dL  BUN 20 6 - 23 mg/dL   Creatinine, Ser 2.62 (H) 0.50 - 1.10 mg/dL   Calcium 9.9 8.4 - 10.5 mg/dL   Phosphorus 2.9 2.3 - 4.6 mg/dL   Albumin 3.3 (L) 3.5 - 5.2 g/dL   GFR calc non Af Amer 18 (L) >90 mL/min   GFR calc Af Amer 20 (L) >90 mL/min    Comment: (NOTE) The eGFR has been calculated using the CKD EPI equation. This calculation has not been validated in all clinical situations. eGFR's persistently <90 mL/min signify possible Chronic Kidney Disease.    Anion gap 16 (H) 5 - 15  Glucose, capillary      Status: Abnormal   Collection Time: 08/18/14  6:49 AM  Result Value Ref Range   Glucose-Capillary 133 (H) 70 - 99 mg/dL     HEENT: normal, eyes non injected, no drainage Cardio: RRR and no murmur Resp: CTA B/L and unlabored GI: BS positive and NT, ND Extremity:  Pulses positive and trace LE Edema Skin:   Intact Neuro: Alert/Oriented. Improved trunk control Musc/Skel:  Swelling Left arm, with good thrill at AV graft site--non tender, stump non tender no edemaGen NAD   Assessment/Plan: 1. Functional deficits secondary to Critical illness myopathy anoxic encepholopathy which require 3+ hours per day of interdisciplinary therapy in a comprehensive inpatient rehab setting. Physiatrist is providing close team supervision and 24 hour management of active medical problems listed below. Physiatrist and rehab team continue to assess barriers to discharge/monitor patient progress toward functional and medical goals. Should be ready for d/c in am FIM: FIM - Bathing Bathing Steps Patient Completed: Chest, Right Arm, Left Arm, Front perineal area, Left upper leg, Abdomen, Right upper leg, Buttocks Bathing: 4: Min-Patient completes 8-9 [id number] parts or 75+ percent  FIM - Upper Body Dressing/Undressing Upper body dressing/undressing steps patient completed: Thread/unthread right bra strap, Thread/unthread left bra strap, Thread/unthread right sleeve of pullover shirt/dresss, Thread/unthread left sleeve of pullover shirt/dress, Put head through opening of pull over shirt/dress, Pull shirt over trunk Upper body dressing/undressing: 4: Min-Patient completed 75 plus % of tasks FIM - Lower Body Dressing/Undressing Lower body dressing/undressing steps patient completed: Thread/unthread left pants leg Lower body dressing/undressing: 2: Max-Patient completed 25-49% of tasks  FIM - Toileting Toileting steps completed by patient: Performs perineal hygiene Toileting: 0: Activity did not occur  FIM - TGlass blower/designerDevices: Elevated toilet seat, WEnvironmental consultant Grab bars Toilet Transfers: 3-From toilet/BSC: Mod A (lift or lower assist)  FIM - BControl and instrumentation engineerDevices: Prosthesis, WAdult nurseTransfer: 4: Bed > Chair or W/C: Min A (steadying Pt. > 75%), 4: Sit > Supine: Min A (steadying pt. > 75%/lift 1 leg)  FIM - Locomotion: Wheelchair Distance: 40 Locomotion: Wheelchair: 1: Travels less than 50 ft with supervision, cueing or coaxing FIM - Locomotion: Ambulation Locomotion: Ambulation Assistive Devices: WAdministratorAmbulation/Gait Assistance: 4: Min guard Locomotion: Ambulation: 1: Travels less than 50 ft with minimal assistance (Pt.>75%)  Comprehension Comprehension Mode: Auditory Comprehension: 5-Follows basic conversation/direction: With extra time/assistive device  Expression Expression Mode: Verbal Expression: 4-Expresses basic 75 - 89% of the time/requires cueing 10 - 24% of the time. Needs helper to occlude trach/needs to repeat words.  Social Interaction Social Interaction: 4-Interacts appropriately 75 - 89% of the time - Needs redirection for appropriate language or to initiate interaction.  Problem Solving Problem Solving: 3-Solves basic 50 - 74% of the time/requires cueing 25 - 49% of the time  Memory Memory:  3-Recognizes or recalls 50 - 74% of the time/requires cueing 25 - 49% of the time  Medical Problem List and Plan: 1. 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   2.  DVT Prophylaxis/Anticoagulation: Pharmaceutical:was on Lovenox, pt refusing  use SCDs 3. Pain Management: Prn oxycodone.   4. Mood: LCSW to follow for evaluation and support. Team to provide egosupport as well.   5. Neuropsych: This patient is capable of making decisions on her own behalf. 6. Skin/Wound  Care: continue foam dressing to sacrum. Order Prevalon boot for right foot as well as elevation when in bed.   7. Fluids/Electrolytes/Nutrition: Strict I/O. 1200 FR due to ESRD. 8. ESRD: HD after therapy day. Nephrology to manage dialysis,               -AVG site non tender  9. CAD: metoprolol, ASA, monitor clinically with functional activities 10. A fib: rate controlled at present. Continue metoprolol   12. DM type II: sliding scale insulin. Check cbg's ac and hs, well controlled but pt with cog defs and had incorrect value stuck in her mind this am             -on amaryl at home which was resumed. Sugars generally improved--will need further titration as outpt 13.  Heme + stool, microscopic, monitor for frank red blood, likely small anal fissure or hemorrhoid plus lovenox, will monitor, transfused in HD, rec GI f/u as outpt  -serial H/H's (10.6 last value stable)  LOS (Days) 20 A FACE TO FACE EVALUATION WAS PERFORMED  Jadiel Schmieder E 08/18/2014, 8:31 AM

## 2014-08-18 NOTE — Progress Notes (Addendum)
Physical Therapy Discharge Summary  Patient Details  Name: Briana Gould MRN: [id number] Date of Birth: 03/04/1946  Today's Date: 08/18/2014 PT Individual Time: 1005-1105; 1335-1405 PT Individual Time Calculation (min): 60 min , 30 min  Patient has met 7 of 7 long term goals due to improved activity tolerance, improved balance, improved postural control, increased strength, decreased pain, ability to compensate for deficits, functional use of  right upper extremity, right lower extremity, left upper extremity and left lower extremity and improved awareness.  Patient to discharge at a wheelchair level, supervision>  Min Assist for transfers, and ambulation, close supervision.  Patient's care partner is independent to provide the necessary physical and cognitive assistance at discharge.  Reasons goals not met: n/a  Recommendation:  Patient will benefit from ongoing skilled PT services in home health setting to continue to advance safe functional mobility, address ongoing impairments in strength, balance, activity tolerance, and minimize fall risk.  Equipment: No equipment provided; pt owns RW and used her personal Breezy w/c here during her stay. This w/c is very worn, with seat sagging so much that pt can feel the cross bars through the seat; she is using a non- effective cushion which she bought herself.   Pt had a w/c evaluation on 08/17/14 with Briana Gould at [facility]; [phone] cell.  Pt is a poor historian regarding when she received her w/c, and initially stated that it was 48-46 years old; family was unable to confirm this.   Eric determined that her present w/c was delivered to her in 2012, and she recently received elevating leg rests for it; all from [facility].  Since pt lives in [place] and can still be served by [facility], Briana Gould stated that Medicare will require that she work with [facility].  Briana Gould will be sending pt information regarding his recommendations of a new w/c with adequate seating.    This PT strongly recommends that HHPT coordinate with [facility] and the pt so that improvements are made to this w/c (probably new seat material ) for patients comfort, function in the w/c, and to protect her skin. She had skin breakdown on her bottom when she was admitted to [facility], which has since resolved.   Reasons for discharge: treatment goals met and discharge from hospital  Patient/family agrees with progress made and goals achieved: Yes  PT Discharge  Tx 1:  Therapeutic exercise performed with LE to increase strength for functional mobility: in sitting- 5 x 1 each resisted R and L hip flexion, R knee extension/flexion, R ankle DF. Brother Briana Gould and paid caregiver Briana Gould participated further in family ed.  Briana Gould return demonstrated safe basic transfer, gait.  Briana Gould return demonstrated basic transfer, simulated car transfer to SUV height seat (30") with RW, and managing pt in w/c up/down ramp.  Discussed entry and exit of home with pt in w/c;  previously she ambulated up/down ramp to get into car.  PT advised pt and family not to ambulate pt on ramp until cleared by HHPT, due to limited activity tolerance, especially when returning from HD.  Tx 2:  Pt sound asleep in bed, but easily awakened and willing to participate.  Bed mobility with supervision slowly without use of rail. Pt donned prosthesis with set -up for sock and R shoe for traction as she sat EOB. Sit< stand from slightly raised bed, close supervision and extra time.  Gait in room to simuluate home enrioronment, x 15' x 2 with close supervision, including turns to L and R .  Furniture transfer with RW to recliner, close supervision, cues for slowing descent. Pt returned to bed, cues to slow descent stand> sit.  Positioned comfortably in L sidelying with pillows to avoid wt bearing R heel. All needs left within reach. Pt awaiting transport for HD.  Precautions/Restrictions Restrictions Weight Bearing Restrictions: No Vital Signs DOE  after transfers and gait; recovers quickly with seated rest in 1 minute.   Pain Pain Assessment Pain Assessment: No/denies pain Vision/Perception - wears glasses; no change from baseline    Cognition Overall Cognitive Status: History of cognitive impairments - at baseline Arousal/Alertness: Awake/alert Orientation Level: Oriented X4 Sensation Sensation Light Touch: Impaired Detail Light Touch Impaired Details: Impaired RLE (distal LL, posterior; poor localization of toes; parasthesias R lateral foot) Proprioception: Appears Intact Coordination Heel Shin Test: RLE limited excursion and speed Motor  Motor Motor: Motor impersistence;Abnormal postural alignment and control Motor - Skilled Clinical Observations: slouched, kyphotic posture edge of bed, generalized weakness Motor - Discharge Observations: improved strength throughout; difficulty recruiting bil gluteal muscles in standing and during stand> sit  Mobility Bed Mobility Bed Mobility:  (supervision overall; slowly without cues) Transfers Transfers: Yes Stand Pivot Transfers: 5: Supervision;4: Min assist Stand Pivot Transfer Details: Verbal cues for technique;Verbal cues for precautions/safety Stand Pivot Transfer Details (indicate cue type and reason): poor eccentric control during stand> sit Lateral/Scoot Transfer Details (indicate cue type and reason): not tested at d/c, as pt is able to wear prosthesis and do SPT now Locomotion  Ambulation Ambulation: Yes Ambulation/Gait Assistance: 5: Supervision Ambulation Distance (Feet): 22 Feet Assistive device: Rolling walker Ambulation/Gait Assistance Details: Verbal cues for technique Ambulation/Gait Assistance Details: to widen stance Gait Gait: Yes Gait Pattern: Impaired Gait Pattern: Decreased hip/knee flexion - left;Trunk flexed;Narrow base of support;Decreased stride length;Decreased stance time - left;Step-through pattern Gait velocity: significantly decreased Stairs  / Additional Locomotion Stairs: No Architect: Yes Wheelchair Assistance: 5: Investment banker, operational Details: Verbal cues for Marketing executive: Both upper extremities (loose ACE over L UE fistula ) Wheelchair Parts Management: Supervision/cueing Distance: 100  Trunk/Postural Assessment  Cervical Assessment Cervical Assessment: Exceptions to [facility] Cervical AROM Overall Cervical AROM: Deficits;Due to premorid status Overall Cervical AROM Comments: limited extension and retraction Thoracic Assessment Thoracic Assessment: Exceptions to [facility] Thoracic Strength Overall Thoracic Strength: Due to premorbid status Overall Thoracic Strength Comments: limited extension Lumbar Assessment Lumbar Assessment: Exceptions to [facility] Lumbar AROM Overall Lumbar AROM: Deficits;Due to premorid status Overall Lumbar AROM Comments: generally stiff and limited in all directions Postural Control Postural Control: Deficits on evaluation Trunk Control: improved trunk control since admission with less reliance on UEs for support in sitting Postural Limitations: slouched sitting posture  Balance Balance Balance Assessed: Yes Static Sitting Balance Static Sitting - Balance Support: No upper extremity supported Static Sitting - Level of Assistance: 6: Modified independent (Device/Increase time) Dynamic Sitting Balance Dynamic Sitting - Level of Assistance: 5: Stand by assistance Static Standing Balance Static Standing - Balance Support: Bilateral upper extremity supported Static Standing - Level of Assistance: 5: Stand by assistance Dynamic Standing Balance Dynamic Standing - Balance Support: Bilateral upper extremity supported Dynamic Standing - Level of Assistance: 4: Min assist Dynamic Standing - Comments: pt is very reluctant to lift either UE off of RW once standing Extremity Assessment      RLE Assessment RLE Assessment: Exceptions to [facility] RLE  Strength RLE Overall Strength: Deficits RLE Overall Strength Comments: hip flexion/abd/add 4/5; knee ext 4+/5, knee flexion 4/5, ankle DF 4/5,  all grossly  in sitting LLE Strength LLE Overall Strength Comments: hip   See FIM for current functional status  COOK,CAROLINE 08/18/2014, 11:42 AM

## 2014-08-18 NOTE — Progress Notes (Signed)
Occupational Therapy Discharge Summary  Patient Details  Name: Briana Gould MRN: [id number] Date of Birth: 04-Dec-1945   Patient has met 6 of 8 long term goals due to improved activity tolerance, improved balance, ability to compensate for deficits, improved attention and improved awareness.  Patient to discharge at overall Max Assist with lower body self care and min A with bathing and mobility.  Patient's care partner is independent to provide the necessary physical and cognitive assistance at discharge.    Reasons goals not met: Pt's goal was mod A with LB dressing, but she continues to need max A due to difficulty reaching to foot, difficulty with using reacher, and difficulty with pulling pants over hips. Her caregiver can assist her at home. She also had mod A goal for toileting, but needs max A as she cannot manage her clothing over her hips independently due to decreased standing balance of min A.   Recommendation:  Patient will benefit from ongoing skilled OT services in home health setting to continue to advance functional skills in the area of BADL.  Equipment: BSC, toilet aid  Reasons for discharge: treatment goals met  Patient/family agrees with progress made and goals achieved: Yes  OT Discharge ADL ADL ADL Comments: set up UB self care (except for bra); min A bathing; max A toileting and LB dressing; min A BSC over toilet transfers Vision/Perception  Vision- History Baseline Vision/History: Wears glasses Wears Glasses: At all times Vision- Assessment Vision Assessment: No apparent visual deficits Perception Comments: min impaired with functional mobility  Cognition Overall Cognitive Status: History of cognitive impairments - at baseline Arousal/Alertness: Awake/alert Orientation Level: Oriented X4 Sensation Sensation Light Touch: Impaired Detail Light Touch Impaired Details: Impaired RLE (distal LL, posterior; poor localization of toes; pins and needles R  lateral foot) Stereognosis: Appears Intact Hot/Cold: Appears Intact Proprioception: Appears Intact Coordination Gross Motor Movements are Fluid and Coordinated: Yes Fine Motor Movements are Fluid and Coordinated: Yes Heel Shin Test: RLE limited excursion and speed Motor  Motor Motor: Motor impersistence;Abnormal postural alignment and control Motor - Skilled Clinical Observations: slouched, kyphotic posture edge of bed, generalized weakness Motor - Discharge Observations: improved strength throughout; difficulty recruiting bil gluteal muscles in standing and during stand> sit Mobility  Bed Mobility Bed Mobility:  (supervision overall; slowly without cues)  Trunk/Postural Assessment  Cervical Assessment Cervical Assessment: Exceptions to [facility] Cervical AROM Overall Cervical AROM: Deficits;Due to premorid status Overall Cervical AROM Comments: limited extension and retraction Thoracic Assessment Thoracic Assessment: Exceptions to [facility] Thoracic Strength Overall Thoracic Strength: Due to premorbid status Overall Thoracic Strength Comments: limited extension Lumbar Assessment Lumbar Assessment: Exceptions to [facility] Lumbar AROM Overall Lumbar AROM: Deficits;Due to premorid status Overall Lumbar AROM Comments: generally stiff and limited in all directions Postural Control Postural Control: Deficits on evaluation Trunk Control: improved trunk control since admission with less reliance on UEs for support in sitting Postural Limitations: slouched sitting posture  Balance Balance Balance Assessed: Yes Static Sitting Balance Static Sitting - Balance Support: No upper extremity supported Static Sitting - Level of Assistance: 6: Modified independent (Device/Increase time) Dynamic Sitting Balance Dynamic Sitting - Level of Assistance: 5: Stand by assistance Static Standing Balance Static Standing - Balance Support: Bilateral upper extremity supported Static Standing - Level of Assistance: 5:  Stand by assistance Dynamic Standing Balance Dynamic Standing - Balance Support: Bilateral upper extremity supported Dynamic Standing - Level of Assistance: 4: Min assist Dynamic Standing - Comments: pt is very reluctant to lift either UE off  of RW once standing Extremity/Trunk Assessment RUE AROM (degrees) Overall AROM Right Upper Extremity: Deficits;Due to premorbid status RUE Overall AROM Comments: arm flexion limited to 90 degrees RUE Strength RUE Overall Strength Comments: 4-/5 throughout LUE AROM (degrees) Overall AROM Left Upper Extremity: Deficits;Due to premorbid status LUE Overall AROM Comments: arm flexion limited to 90 degrees, elbow movement minimally limited due to soreness in L fistula LUE Strength LUE Overall Strength Comments: 4-/ 5 throughout  See FIM for current functional status  Briana Gould 08/18/2014, 1:06 PM

## 2014-08-18 NOTE — Discharge Instructions (Signed)
Inpatient Rehab Discharge Instructions  Briana Gould Discharge date and time:  08/16/14  Activities/Precautions/ Functional Status: Activity: activity as tolerated Diet: renal diet 1200 cc fluid/day Wound Care: keep wound clean and dry   Functional status:  ___ No restrictions     ___ Walk up steps independently ___ 24/7 supervision/assistance   ___ Walk up steps with assistance ___ Intermittent supervision/assistance  ___ Bathe/dress independently ___ Walk with walker     ___ Bathe/dress with assistance ___ Walk Independently    ___ Shower independently ___ Walk with assistance    ___ Shower with assistance _X__ No alcohol     ___ Return to work/school ________  Special Instructions: 1. Check blood sugars 2-3 times before meals daily.    COMMUNITY REFERRALS UPON DISCHARGE:    Home Health:   PT, OT, SP, RN, AIDE   Agency:[facility] Phone:[phone] Date of last service:08/19/2014    [facility]    [phone]   Other: PRIVATE DUTY AGENCY LIST GIVEN TO PATIENT SHE IS TO FOLLOW UP WITH   My questions have been answered and I understand these instructions. I will adhere to these goals and the provided educational materials after my discharge from the hospital.  Patient/Caregiver Signature _______________________________ Date __________  Clinician Signature _______________________________________ Date __________  Please bring this form and your medication list with you to all your follow-up doctor's appointments.

## 2014-08-18 NOTE — Progress Notes (Signed)
Subjective:  No complaints, swelling in right leg improving with dialysis  Objective: Vital signs in last 24 hours: Temp:  [98.4 F (36.9 C)-98.7 F (37.1 C)] 98.7 F (37.1 C) (12/10 0533) Pulse Rate:  [76-79] 77 (12/10 0533) Resp:  [16-17] 17 (12/10 0533) BP: (103-146)/(55-91) 139/60 mmHg (12/10 0533) SpO2:  [99 %-100 %] 99 % (12/10 0533) Weight:  [79.7 kg (175 lb 11.3 oz)] 79.7 kg (175 lb 11.3 oz) (12/10 0700) Weight change: -0.2 kg (-7.1 oz)  Intake/Output from previous day: 12/09 0701 - 12/10 0700 In: 360 [P.O.:360] Out: 1800  Intake/Output this shift:   Lab Results:  Recent Labs  08/17/14 1400 08/18/14 0605  WBC 7.5 7.7  HGB 10.1* 10.6*  HCT 32.0* 33.6*  PLT 270 257   BMET:  Recent Labs  08/17/14 1400 08/18/14 0605  NA 138 136*  K 4.0 4.0  CL 96 93*  CO2 24 27  GLUCOSE 161* 123*  BUN 31* 20  CREATININE 3.80* 2.62*  CALCIUM 9.6 9.9  ALBUMIN 3.1* 3.3*   No results for input(s): PTH in the last 72 hours. Iron Studies: No results for input(s): IRON, TIBC, TRANSFERRIN, FERRITIN in the last 72 hours.  Studies/Results: No results found.   EXAM: General appearance: Alert, in no apparent distress Resp: CTA without rales, rhonchi, or wheezes Cardio: RRR with Gr II/VI systolic murmur, no rub GI: + BS, soft and nontender Extremities: R LE edema with support stocking, prosthetic on L BKA Access: AVF @ LUA with + bruit, L IJ catheter  HD: MWF [facility]  4h 75kg (dry wt unclear, never gets to dry wt, has limits on BP and UF) 2/2.0 Bath Heparin 5000 LUA AVF / L IJ cath 350/A1.5 Profile 4 Limit UF to 3 L/ treatment Hectorol 2, Aranesp 150/wk , Venofer 50 /wk  Assessment/Plan: 1. Critical illness myopathy - rehab since 11/20, progressing. 2. A-fib - not a candidate for anticoagulation, HR controlled on Metoprolol 12.5 mg qd.  3. ESRD - HD on MWF @ [facility]. Keep BFR 350, 2 liters off max to minimize cardiac stress as much as possible,  Midodrine pre-HD, daily HD this week.  4. HTN/volume- BP support with Midodrine 5 mg bid; wt down to 77.9 kg, now 79.7, but wearing prosthetic leg (10 L over after [employer]), RLE edema improving.  5. Anemia - s/p PRBCs (1 U 11/24, 1 U 11/27), Hb up to 10.6, Aranesp at 150 mcg on Wed, TSat 29% on 11/22. 6. Sec HPT -Ca 9.9 (10.5 corrected), P 2.9, PTH 769 (11/4); Hectorol 5 mcg, reduced Renvela to 1 with meals. 7. Nutrition - Alb 3.3, Nepro, renal diet.  8. LUA AVF hematoma/infiltration - now using. 9. Cardiac arrest - 3rd episode 10/19, avoid hypotension on HD. 10. Severe multivessel CAD - inoperable, on BB. 11. DM - per primary, A1c 6.5 (11/20)   LOS: 20 days   LYLES,CHARLES 08/18/2014,9:18 AM  Renal Attending: Agree with above.  We will plan HD again today and in AM to optimize volume status.   Will ask IR to remove TDC before discharge in AM. Jereline Ticer C

## 2014-08-19 ENCOUNTER — Inpatient Hospital Stay (HOSPITAL_COMMUNITY): Payer: Medicare Other

## 2014-08-19 DIAGNOSIS — G931 Anoxic brain damage, not elsewhere classified: Secondary | ICD-10-CM

## 2014-08-19 DIAGNOSIS — I6782 Cerebral ischemia: Secondary | ICD-10-CM

## 2014-08-19 DIAGNOSIS — N186 End stage renal disease: Secondary | ICD-10-CM

## 2014-08-19 DIAGNOSIS — Z89512 Acquired absence of left leg below knee: Secondary | ICD-10-CM

## 2014-08-19 DIAGNOSIS — G7281 Critical illness myopathy: Secondary | ICD-10-CM

## 2014-08-19 DIAGNOSIS — E1322 Other specified diabetes mellitus with diabetic chronic kidney disease: Secondary | ICD-10-CM

## 2014-08-19 DIAGNOSIS — Z992 Dependence on renal dialysis: Secondary | ICD-10-CM

## 2014-08-19 LAB — RENAL FUNCTION PANEL
ALBUMIN: 3.3 g/dL — AB (ref 3.5–5.2)
Anion gap: 17 — ABNORMAL HIGH (ref 5–15)
BUN: 26 mg/dL — ABNORMAL HIGH (ref 6–23)
CALCIUM: 9.9 mg/dL (ref 8.4–10.5)
CO2: 27 mEq/L (ref 19–32)
CREATININE: 3.22 mg/dL — AB (ref 0.50–1.10)
Chloride: 94 mEq/L — ABNORMAL LOW (ref 96–112)
GFR calc Af Amer: 16 mL/min — ABNORMAL LOW (ref >90)
GFR, EST NON AFRICAN AMERICAN: 14 mL/min — AB (ref >90)
Glucose, Bld: 162 mg/dL — ABNORMAL HIGH (ref 70–99)
PHOSPHORUS: 2.8 mg/dL (ref 2.3–4.6)
Potassium: 3.7 mEq/L (ref 3.7–5.3)
Sodium: 138 mEq/L (ref 137–147)

## 2014-08-19 LAB — CBC
HCT: 32.6 % — ABNORMAL LOW (ref 36.0–46.0)
Hemoglobin: 10.2 g/dL — ABNORMAL LOW (ref 12.0–15.0)
MCH: 30.2 pg (ref 26.0–34.0)
MCHC: 31.3 g/dL (ref 30.0–36.0)
MCV: 96.4 fL (ref 78.0–100.0)
PLATELETS: 262 10*3/uL (ref 150–400)
RBC: 3.38 MIL/uL — ABNORMAL LOW (ref 3.87–5.11)
RDW: 19.1 % — AB (ref 11.5–15.5)
WBC: 8 10*3/uL (ref 4.0–10.5)

## 2014-08-19 LAB — GLUCOSE, CAPILLARY: Glucose-Capillary: 106 mg/dL — ABNORMAL HIGH (ref 70–99)

## 2014-08-19 MED ORDER — METOCLOPRAMIDE HCL 10 MG PO TABS: 10.0000 mg | ORAL_TABLET | Freq: Three times a day (TID) | ORAL | Status: DC

## 2014-08-19 MED ORDER — METOPROLOL TARTRATE 25 MG/10 ML ORAL SUSPENSION
12.5000 mg | Freq: Two times a day (BID) | ORAL | Status: DC
Start: 2014-08-19 — End: 2014-08-19

## 2014-08-19 MED ORDER — MIDODRINE HCL 5 MG PO TABS: 5.0000 mg | ORAL_TABLET | Freq: Two times a day (BID) | ORAL | Status: DC

## 2014-08-19 MED ORDER — SEVELAMER CARBONATE 800 MG PO TABS: 800.0000 mg | ORAL_TABLET | Freq: Three times a day (TID) | ORAL | Status: AC

## 2014-08-19 MED ORDER — DOXERCALCIFEROL 4 MCG/2ML IV SOLN
INTRAVENOUS | Status: AC
Start: 2014-08-19 — End: 2014-08-19
  Administered 2014-08-19: 5 ug via INTRAVENOUS
  Filled 2014-08-19: qty 4

## 2014-08-19 MED ORDER — LIDOCAINE HCL 1 % IJ SOLN
INTRAMUSCULAR | Status: AC
  Filled 2014-08-19: qty 20

## 2014-08-19 MED ORDER — RENA-VITE PO TABS: 1.0000 | ORAL_TABLET | Freq: Every day | ORAL | Status: DC

## 2014-08-19 MED ORDER — CHLORHEXIDINE GLUCONATE 4 % EX LIQD
CUTANEOUS | Status: AC
  Filled 2014-08-19: qty 15

## 2014-08-19 MED ORDER — METOPROLOL TARTRATE 12.5 MG HALF TABLET: ORAL_TABLET | ORAL | Status: DC

## 2014-08-19 MED ORDER — FAMOTIDINE 20 MG PO TABS: 20.0000 mg | ORAL_TABLET | Freq: Every day | ORAL | Status: AC

## 2014-08-19 MED ORDER — DSS 100 MG PO CAPS: 100.0000 mg | ORAL_CAPSULE | Freq: Two times a day (BID) | ORAL | Status: DC

## 2014-08-19 MED ORDER — OXYCODONE HCL 5 MG PO TABS: 5.0000 mg | ORAL_TABLET | ORAL | Status: DC | PRN

## 2014-08-19 MED ORDER — ASPIRIN 81 MG PO CHEW: 81.0000 mg | CHEWABLE_TABLET | Freq: Every day | ORAL | Status: DC

## 2014-08-19 MED ORDER — GLIMEPIRIDE 2 MG PO TABS: 2.0000 mg | ORAL_TABLET | Freq: Every day | ORAL | Status: DC

## 2014-08-19 MED ORDER — METOPROLOL TARTRATE 12.5 MG HALF TABLET: 12.5000 mg | ORAL_TABLET | Freq: Two times a day (BID) | ORAL | Status: DC

## 2014-08-19 MED ORDER — POLYETHYLENE GLYCOL 3350 17 G PO PACK: 17.0000 g | PACK | Freq: Every day | ORAL | Status: DC

## 2014-08-19 NOTE — Plan of Care (Signed)
Problem: RH BOWEL ELIMINATION Goal: RH STG MANAGE BOWEL WITH ASSISTANCE STG Manage Bowel with min Assistance.  Outcome: Completed/Met Date Met:  08/19/14 Goal: RH STG MANAGE BOWEL W/MEDICATION W/ASSISTANCE STG Manage Bowel with Medication with min Assistance.  Outcome: Completed/Met Date Met:  08/19/14  Problem: RH SAFETY Goal: RH STG ADHERE TO SAFETY PRECAUTIONS W/ASSISTANCE/DEVICE STG Adhere to Safety Precautions With min Assistance/Device.  Outcome: Completed/Met Date Met:  08/19/14 Goal: RH STG DECREASED RISK OF FALL WITH ASSISTANCE STG Decreased Risk of Fall With min Assistance.  Outcome: Completed/Met Date Met:  08/19/14  Problem: RH COGNITION-NURSING Goal: RH STG USES MEMORY AIDS/STRATEGIES W/ASSIST TO PROBLEM SOLVE STG Uses Memory Aids/Strategies With Min Assistance to Problem Solve.  Outcome: Completed/Met Date Met:  08/19/14 Goal: RH STG ANTICIPATES NEEDS/CALLS FOR ASSIST W/ASSIST/CUES STG Anticipates Needs/Calls for Assist With Min Assistance/Cues.  Outcome: Completed/Met Date Met:  08/19/14  Problem: RH PAIN MANAGEMENT Goal: RH STG PAIN MANAGED AT OR BELOW PT'S PAIN GOAL Less than 3 out of 10  Outcome: Completed/Met Date Met:  08/19/14  Problem: RH KNOWLEDGE DEFICIT Goal: RH STG INCREASE KNOWLEDGE OF DIABETES Patient will verbalize management to diabetes to include diet, exercise, medications, and signs and symptoms of hypo- and hyper- glycemia with min assistance  Outcome: Completed/Met Date Met:  08/19/14 Goal: RH STG INCREASE KNOWLEDGE OF HYPERTENSION Patient will verbalize management of hypertension to include medications, diet, exercise, and how to take and read a blood pressure value with min assistance  Outcome: Completed/Met Date Met:  08/19/14

## 2014-08-19 NOTE — Procedures (Signed)
Successful LT IJ HD CATH REMOVAL NO COMP STABLE

## 2014-08-19 NOTE — Progress Notes (Signed)
Social Work Discharge Note Discharge Note  The overall goal for the admission was met for:   Discharge location: [facility] CAN PROVIDE 24 HR CARE  Length of Stay: Yes-21 DAYS  Discharge activity level: Yes-SUPERVISION-MIN LEVEL-FLUCUATES IN LEVELS  Home/community participation: Yes  Services provided included: MD, RD, PT, OT, SLP, RN, CM, TR, Pharmacy, Neuropsych and SW  Financial Services: Medicare and Private Insurance: [employer]  Follow-up services arranged: Home Health: [facility]-PT,OT,RN,SP,AIDE, DME: [facility] and Patient/Family request agency HH: PREF HAD BEFORE, DME: USED BEFORE  Comments (or additional information):FAMILY HAS BEEN IN MULTIPLE TIMES FOR FAMILY EDUCATION-FEEL COMFORTABLE WITH HER CARE, AWARE SHE WILL REQUIRE 24 HR CARE. PT TRYING TO CHANGE HER HD SLOT TO AM SWITCHED SINCE BEING HOSPITALIZED-AND WORKING TRANSPORTATION ISSUES-HAS RESOURCES FOR PRIVATE DUTY AND TRANSPORTATION SERVICES  Patient/Family verbalized understanding of follow-up arrangements: Yes  Individual responsible for coordination of the follow-up plan: PATIENT & [name] & [name]  Confirmed correct DME delivered: Elease Hashimoto 08/19/2014    Elease Hashimoto

## 2014-08-19 NOTE — Progress Notes (Signed)
Inpatient Diabetes Program Recommendations  AACE/ADA: New Consensus Statement on Inpatient Glycemic Control (2013)  Target Ranges:  Prepandial:   less than 140 mg/dL      Peak postprandial:   less than 180 mg/dL (1-2 hours)      Critically ill patients:  140 - 180 mg/dL   Hypoglycemia following large doses of correction using the moderate correction tidwc. Inpatient Diabetes Program Recommendations Correction (SSI): Recommend using the correction tidwc but decreasing it to sensitive tidwc Insulin - Meal Coverage: xxxxxxxxxxx Oral Agents: Agree with diiscontinuation of Amaryl  Thank you, Lenor CoffinAnn Elliannah Wayment, RN, CNS, Diabetes Coordinator [phone])

## 2014-08-19 NOTE — Progress Notes (Signed)
Patient received discharge instructions from Dan Angiulli, PA-C with verbal understanding. Patient discharged to home with family and belongings. 

## 2014-08-19 NOTE — Procedures (Signed)
Tolerating HD today with some reductions in BP. We have been successful in optimizing her volume removal with daily dialysis in preparation for out pt HD. We plan to get HD catheter removed while in hospital as in pt in light [facility] and her high risk for cardiac events. IR has been contacted. Briana Gould

## 2014-08-22 NOTE — Discharge Summary (Signed)
Physician Discharge Summary  [name] MRN: [id number] DOB/AGE: 377-Feb-1947 68 y.o.  Admit date: 07/29/2014 Discharge date: 08/19/2014  Discharge Diagnoses:  Principal Problem:   Critical illness myopathy Active Problems:   Diabetes mellitus with end stage renal disease   Essential hypertension   S/P unilateral BKA (below knee amputation)   ESRD (end stage renal disease) on dialysis   CAD- severe 3V CAD at cath March 2015- medical Rx   Anemia of chronic renal failure   Mild anoxic-ischemic encephalopathy   Discharged Condition: Stable.   Significant Diagnostic Studies: Ir Removal Tun Cv Cath W/o Fl  08/19/2014   CLINICAL DATA:  End-stage renal disease, functioning AV fistula, catheter access no longer required  EXAM: LEFT INTERNAL JUGULARTUNNELED HEMODIAYLISIS CATHER REMOVAL  Date:  12/11/201512/07/2014 2:16 pm  Radiologist:  M. Ruel Favorsrevor Shick, MD  MEDICATIONS AND MEDICAL HISTORY: NONE.  COMPLICATIONS: NONE IMMEDIATE  PROCEDURE: Informed consent was obtained from the patient following explanation of the procedure, risks, benefits and alternatives. The patient understands, agrees and consents for the procedure. All questions were addressed. A time out was performed.  Maximal barrier sterile technique utilized including caps, mask, sterile gowns, sterile gloves, large sterile drape, hand hygiene, and ChloraPrep  The left IJtunneled catheter was removed utilizing firm retraction to release the retention cuff. The catheter was removed entirely. Hemostasis was obtained with compression. No immediate complication. Sterile dressing applied. The patient tolerated the procedure well.  IMPRESSION: Successful left IJtunnel dialysis catheter removal.   Electronically Signed   By: Ruel Favorsrevor  Shick M.D.   On: 08/19/2014 14:48   Dg Chest Port 1 View  07/28/2014   CLINICAL DATA:  Shortness of breath.  EXAM: PORTABLE CHEST - 1 VIEW  COMPARISON:  07/12/2014  FINDINGS: Double lumen central venous  catheter is in the superior vena cava. There is chronic cardiomegaly. Pulmonary vascularity is normal. No infiltrates or effusions. Old right anterior rib fractures with callus formation.  IMPRESSION: No acute abnormality.  Chronic cardiomegaly.   Electronically Signed   By: Geanie CooleyJim  Maxwell M.D.   On: 07/28/2014 16:10    Labs:  Basic Metabolic Panel:  Recent Labs Lab 08/15/14 1456 08/17/14 1400 08/18/14 0605 08/19/14 0700  NA 133* 138 136* 138  K 4.6 4.0 4.0 3.7  CL 93* 96 93* 94*  CO2 20 24 27 27   GLUCOSE 203* 161* 123* 162*  BUN 57* 31* 20 26*  CREATININE 5.94* 3.80* 2.62* 3.22*  CALCIUM 9.7 9.6 9.9 9.9  PHOS 3.4 2.7 2.9 2.8    CBC:  Recent Labs Lab 08/17/14 1400 08/18/14 0605 08/19/14 0700  WBC 7.5 7.7 8.0  HGB 10.1* 10.6* 10.2*  HCT 32.0* 33.6* 32.6*  MCV 99.1 96.6 96.4  PLT 270 257 262    CBG:  Recent Labs Lab 08/18/14 0649 08/18/14 1149 08/18/14 1756 08/18/14 2056 08/19/14 1153  GLUCAP 133* 218* 95 228* 106*    Brief HPI:   68 year old female with h/o severe multivessel CAD (not candidate for percutaneous intervention), ESRD on HD, and PAD s/p R- BKA, h/o prior cardiac arrest X 2; who was admitted to on 06/27/2014 due to cardiopulmonary arrest past having HD cath placed at outpatient center. She received CPR for 2 minutes with return of spontaneous circulation and was unresponsive w/ ineffective ventilation and posturing on arrival to ED. She was placed under the hypothermia protocol. 2D echo with EF 45-50% with mild LVF and elevated pulmonary pressures. On 06/29/2014 she was rewarmed and extubated but remained lethargic and  nonverbal with concerns of anoxic encephalopathy. CT head was negative and  EEG showed non-specific diffuse cerebral disturbance. Cardiology consulted for input and recommended medical treatment as EKG and troponin negative. PCCM felt that arrest most likely due to baseline hypotension and pulmonary hypertension. Once medically cleared [facility]  [facility] due to critical illness myopathy with mild hypoxic encephalopathy.  she has continued to requires midodrine and IV albumin to assist with BP support.  L-AVF infiltrated with hematoma therefore dialysis catheter exchanged by IVR on 11/9. Stage 2 sacral decub covered with foam dressing and right heel with unstable ulcer. Endurance improving and diet advanced to regular thin with chin tuck on 11/17. Therapy ongoing and patient showing increase in activity tolerance. She was evaluated by rehab team and felt to be a good CIR candidate.     Hospital Course: Briana Gould was admitted to rehab 07/29/2014 for inpatient therapies to consist of PT, ST and OT at least three hours five days a week. Past admission physiatrist, therapy team and rehab RN have worked together to provide customized collaborative inpatient rehab. Blood pressures have been monitored on tid basis with nephrology following for input. She continued to have hypotension with difficulty tolerating hemodialysis and midodrine was changed to bid daily.  She developed fluid overload with dyspnea requiring daily HD to optimize volume status and slowly help get weight down. She was transfused with 2 units PRBC as well as IV albumin to help with BP support. She has had 1/2 heme positive stool likely due to constipation as well as use of lovenox . As H/H has improved recommend GI follow up for work up past discharge. Serial CBC shows steady improvement in H/H.    Left AVF hematoma is slowly resolving and she was cleared to cannulate fistula by Dr. Darrick PennaFields. Left IJ cath was removed on 12/11 by Dr. Miles CostainShick.  Diabetes was monitored with ac/hs cbg checks and Amaryl was resumed as blood sugars started trending upwards. Po intake has improved but she has had episodic low BS therefore SSI was used additionally without titration in Amaryl dose. She is to follow up with PMD for further adjustment of medication.  she was advanced to  regular textures with use of safe swallow strategies.  Dr. Jacquelyne BalintMcDermott has been following patient for supportive psychotherapy to help with bereavement support. She was given information to follow up for outpatient grief counseling past discharge. With decrease in RLE edema she was able to use her prosthesis and length of stay was extended to help with futher progression as patient was showing steady progress.  Patient progressed to supervision to min assist at wheelchair level. [facility] will continue to provide HHPT, HHOT, HHRN, aide for follow up past discharge.    Rehab course: During patient's stay in rehab weekly team conferences were held to monitor patient's progress, set goals and discuss barriers to discharge. At admission patient requires max assist with mobility and self care tasks. Cognitive evaluation revealed decreased ability to recall of new information as well as deficits in problem solving and awareness which impacted her safety with functional and familiar tasks. Patient has had improvement in activity tolerance, balance, postural control, as well as ability to compensate for deficits. She requires supervision for wheelchair navigation and min assist for transfers. She requires close supervision for ambulating 22 feet with RW. She requires min assist with bathing and balance with ADL tasks. She continues to requires max assist with LB dressing  and toileting. She requires supervision with medication and financial management at discharge due to baseline cognitive impairments and overall debilitated state which makes it difficult to do basic things independently.  Family education was done with caregiver who will provide necessary physical and cognitive assistance needed past discharge.    Disposition: 01-Home or Self Care  Diet: Renal diet. 1200 cc fluid/daily  Special Instructions: 1. Check blood sugars 2-3 times a day before meals daily.     Medication List    STOP taking  these medications        albuterol (2.5 MG/3ML) 0.083% nebulizer solution  Commonly known as:  PROVENTIL     antiseptic oral rinse Liqd     dextromethorphan 30 MG/5ML liquid  Commonly known as:  DELSYM     feeding supplement (NEPRO CARB STEADY) Liqd     fluticasone 110 MCG/ACT inhaler  Commonly known as:  FLOVENT HFA     NOVOLOG 100 UNIT/ML injection  Generic drug:  insulin aspart     pantoprazole 40 MG tablet  Commonly known as:  PROTONIX      TAKE these medications        acetaminophen 325 MG tablet  Commonly known as:  TYLENOL  Take 2 tablets (650 mg total) by mouth every 6 (six) hours as needed for moderate pain.     aspirin 81 MG chewable tablet  Chew 1 tablet (81 mg total) by mouth daily.     multivitamin Tabs tablet  Take 1 tablet by mouth at bedtime.     b complex-vitamin c-folic acid 0.8 MG Tabs tablet  Take 1 tablet by mouth daily.     darbepoetin 60 MCG/0.3ML Soln injection  Commonly known as:  ARANESP  Inject 60 mcg into the skin every 7 (seven) days. Wednesdays with dialysis     DSS 100 MG Caps  Take 100 mg by mouth 2 (two) times daily.     famotidine 20 MG tablet  Commonly known as:  PEPCID  Take 1 tablet (20 mg total) by mouth at bedtime.     feeding supplement (PRO-STAT SUGAR FREE 64) Liqd  Take 30 mLs by mouth 2 (two) times daily at 10 AM and 5 PM.     glimepiride 2 MG tablet  Commonly known as:  AMARYL  Take 1 tablet (2 mg total) by mouth daily with breakfast.     guaiFENesin 600 MG 12 hr tablet  Commonly known as:  MUCINEX  Take 1 tablet (600 mg total) by mouth 2 (two) times daily.     metoCLOPramide 10 MG tablet  Commonly known as:  REGLAN  Take 1 tablet (10 mg total) by mouth 4 (four) times daily -  before meals and at bedtime.     metoprolol tartrate 12.5 mg Tabs tablet  Commonly known as:  LOPRESSOR  1 tab twice daily Sunday Tuesdays Thursdays Saturdays     midodrine 5 MG tablet  Commonly known as:  PROAMATINE  Take 1 tablet  (5 mg total) by mouth 2 (two) times daily with a meal.     oxyCODONE 5 MG immediate release tablet--Rx # 90 pills  Commonly known as:  Oxy IR/ROXICODONE  Take 1-2 tablets (5-10 mg total) by mouth every 4 (four) hours as needed for severe pain.     polyethylene glycol packet  Commonly known as:  MIRALAX / GLYCOLAX  Take 17 g by mouth daily.     prednisoLONE acetate 1 % ophthalmic suspension  Commonly known as:  PRED FORTE  Place 1 drop into both eyes 3 (three) times daily.     sevelamer carbonate 800 MG tablet  Commonly known as:  RENVELA  Take 1 tablet (800 mg total) by mouth 3 (three) times daily with meals.           Follow-up Information    Follow up with Erick Colace, MD.   Specialty:  Physical Medicine and Rehabilitation   Why:  As needed   Contact information:   [address] [place] [address] [phone]       Follow up with Sherren Kerns, MD.   Specialty:  Vascular Surgery   Contact information:   [address] [place] [address] [phone]       Follow up with [name], MD On 08/25/2014.   Specialty:  Internal Medicine   Why:   APPT @ 1;15 PM   Contact information:   [address] [place] [address] [phone]       Signed: Jacquelynn Cree 08/22/2014, 9:29 AM

## 2014-09-12 DIAGNOSIS — I120 Hypertensive chronic kidney disease with stage 5 chronic kidney disease or end stage renal disease: Secondary | ICD-10-CM | POA: Diagnosis not present

## 2014-09-12 DIAGNOSIS — L89610 Pressure ulcer of right heel, unstageable: Secondary | ICD-10-CM

## 2014-09-12 DIAGNOSIS — I2510 Atherosclerotic heart disease of native coronary artery without angina pectoris: Secondary | ICD-10-CM

## 2014-09-12 DIAGNOSIS — Z992 Dependence on renal dialysis: Secondary | ICD-10-CM

## 2014-09-12 DIAGNOSIS — Z89512 Acquired absence of left leg below knee: Secondary | ICD-10-CM

## 2014-09-12 DIAGNOSIS — N186 End stage renal disease: Secondary | ICD-10-CM | POA: Diagnosis not present

## 2014-09-12 DIAGNOSIS — D638 Anemia in other chronic diseases classified elsewhere: Secondary | ICD-10-CM | POA: Diagnosis not present

## 2014-09-12 DIAGNOSIS — E1122 Type 2 diabetes mellitus with diabetic chronic kidney disease: Secondary | ICD-10-CM | POA: Diagnosis not present

## 2014-09-12 DIAGNOSIS — Z8674 Personal history of sudden cardiac arrest: Secondary | ICD-10-CM

## 2014-09-22 ENCOUNTER — Encounter (HOSPITAL_COMMUNITY): Payer: Self-pay | Admitting: Interventional Radiology

## 2014-10-05 ENCOUNTER — Other Ambulatory Visit: Payer: Self-pay | Admitting: Physical Medicine & Rehabilitation

## 2014-10-21 ENCOUNTER — Other Ambulatory Visit: Payer: Self-pay | Admitting: Physical Medicine & Rehabilitation

## 2014-10-29 ENCOUNTER — Other Ambulatory Visit: Payer: Self-pay | Admitting: Physical Medicine & Rehabilitation

## 2014-10-30 ENCOUNTER — Other Ambulatory Visit: Payer: Self-pay | Admitting: Physical Medicine & Rehabilitation

## 2014-11-08 DIAGNOSIS — L02419 Cutaneous abscess of limb, unspecified: Secondary | ICD-10-CM

## 2014-11-08 HISTORY — DX: Cutaneous abscess of limb, unspecified: L02.419

## 2014-11-17 ENCOUNTER — Other Ambulatory Visit: Payer: Self-pay | Admitting: Internal Medicine

## 2014-11-17 ENCOUNTER — Encounter (HOSPITAL_BASED_OUTPATIENT_CLINIC_OR_DEPARTMENT_OTHER): Payer: Medicare Other | Attending: Internal Medicine

## 2014-11-17 DIAGNOSIS — M869 Osteomyelitis, unspecified: Secondary | ICD-10-CM

## 2014-11-17 DIAGNOSIS — E11621 Type 2 diabetes mellitus with foot ulcer: Secondary | ICD-10-CM | POA: Insufficient documentation

## 2014-11-17 DIAGNOSIS — L97519 Non-pressure chronic ulcer of other part of right foot with unspecified severity: Secondary | ICD-10-CM | POA: Insufficient documentation

## 2014-11-17 DIAGNOSIS — L97419 Non-pressure chronic ulcer of right heel and midfoot with unspecified severity: Secondary | ICD-10-CM | POA: Diagnosis not present

## 2014-11-17 DIAGNOSIS — Y835 Amputation of limb(s) as the cause of abnormal reaction of the patient, or of later complication, without mention of misadventure at the time of the procedure: Secondary | ICD-10-CM | POA: Diagnosis not present

## 2014-11-17 DIAGNOSIS — L98499 Non-pressure chronic ulcer of skin of other sites with unspecified severity: Secondary | ICD-10-CM

## 2014-11-17 DIAGNOSIS — E11622 Type 2 diabetes mellitus with other skin ulcer: Secondary | ICD-10-CM | POA: Diagnosis present

## 2014-11-17 DIAGNOSIS — L97411 Non-pressure chronic ulcer of right heel and midfoot limited to breakdown of skin: Secondary | ICD-10-CM | POA: Insufficient documentation

## 2014-11-17 DIAGNOSIS — Z89512 Acquired absence of left leg below knee: Secondary | ICD-10-CM | POA: Diagnosis not present

## 2014-11-17 DIAGNOSIS — T879 Unspecified complications of amputation stump: Secondary | ICD-10-CM | POA: Diagnosis not present

## 2014-11-17 DIAGNOSIS — L97829 Non-pressure chronic ulcer of other part of left lower leg with unspecified severity: Secondary | ICD-10-CM | POA: Diagnosis not present

## 2014-11-17 DIAGNOSIS — I739 Peripheral vascular disease, unspecified: Secondary | ICD-10-CM | POA: Insufficient documentation

## 2014-11-18 ENCOUNTER — Ambulatory Visit (HOSPITAL_COMMUNITY)
Admission: RE | Admit: 2014-11-18 | Discharge: 2014-11-18 | Disposition: A | Discharge status: Home / Self Care | Payer: Medicare Other | Source: Ambulatory Visit | Attending: Internal Medicine | Admitting: Internal Medicine

## 2014-11-18 ENCOUNTER — Ambulatory Visit (INDEPENDENT_AMBULATORY_CARE_PROVIDER_SITE_OTHER)
Admission: RE | Admit: 2014-11-18 | Discharge: 2014-11-18 | Disposition: A | Discharge status: Home / Self Care | Payer: Medicare Other | Source: Ambulatory Visit | Attending: Vascular Surgery | Admitting: Vascular Surgery

## 2014-11-18 ENCOUNTER — Other Ambulatory Visit (HOSPITAL_COMMUNITY): Payer: Self-pay | Admitting: Surgery

## 2014-11-18 DIAGNOSIS — L97319 Non-pressure chronic ulcer of right ankle with unspecified severity: Secondary | ICD-10-CM

## 2014-11-18 DIAGNOSIS — Z89512 Acquired absence of left leg below knee: Secondary | ICD-10-CM | POA: Insufficient documentation

## 2014-11-18 DIAGNOSIS — L97419 Non-pressure chronic ulcer of right heel and midfoot with unspecified severity: Secondary | ICD-10-CM | POA: Insufficient documentation

## 2014-11-18 DIAGNOSIS — M869 Osteomyelitis, unspecified: Secondary | ICD-10-CM

## 2014-11-18 DIAGNOSIS — I739 Peripheral vascular disease, unspecified: Secondary | ICD-10-CM | POA: Insufficient documentation

## 2014-11-18 DIAGNOSIS — E11622 Type 2 diabetes mellitus with other skin ulcer: Secondary | ICD-10-CM | POA: Insufficient documentation

## 2014-11-18 DIAGNOSIS — L97829 Non-pressure chronic ulcer of other part of left lower leg with unspecified severity: Secondary | ICD-10-CM | POA: Insufficient documentation

## 2014-11-24 DIAGNOSIS — Z89512 Acquired absence of left leg below knee: Secondary | ICD-10-CM | POA: Diagnosis not present

## 2014-11-24 DIAGNOSIS — E11621 Type 2 diabetes mellitus with foot ulcer: Secondary | ICD-10-CM | POA: Diagnosis not present

## 2014-11-24 DIAGNOSIS — T879 Unspecified complications of amputation stump: Secondary | ICD-10-CM | POA: Diagnosis not present

## 2014-11-24 DIAGNOSIS — L97411 Non-pressure chronic ulcer of right heel and midfoot limited to breakdown of skin: Secondary | ICD-10-CM | POA: Diagnosis not present

## 2014-11-30 ENCOUNTER — Encounter: Payer: Self-pay | Admitting: Vascular Surgery

## 2014-12-01 ENCOUNTER — Encounter: Payer: Self-pay | Admitting: Vascular Surgery

## 2014-12-01 ENCOUNTER — Ambulatory Visit (INDEPENDENT_AMBULATORY_CARE_PROVIDER_SITE_OTHER): Payer: Medicare Other | Admitting: Vascular Surgery

## 2014-12-01 ENCOUNTER — Other Ambulatory Visit: Payer: Self-pay | Admitting: Internal Medicine

## 2014-12-01 VITALS — BP 158/78 | HR 75 | Ht 66.0 in | Wt 165.0 lb

## 2014-12-01 DIAGNOSIS — L97411 Non-pressure chronic ulcer of right heel and midfoot limited to breakdown of skin: Secondary | ICD-10-CM | POA: Diagnosis not present

## 2014-12-01 DIAGNOSIS — E11621 Type 2 diabetes mellitus with foot ulcer: Secondary | ICD-10-CM | POA: Diagnosis not present

## 2014-12-01 DIAGNOSIS — I70299 Other atherosclerosis of native arteries of extremities, unspecified extremity: Secondary | ICD-10-CM | POA: Diagnosis not present

## 2014-12-01 DIAGNOSIS — T879 Unspecified complications of amputation stump: Secondary | ICD-10-CM | POA: Diagnosis not present

## 2014-12-01 DIAGNOSIS — L97909 Non-pressure chronic ulcer of unspecified part of unspecified lower leg with unspecified severity: Secondary | ICD-10-CM

## 2014-12-01 DIAGNOSIS — Z89512 Acquired absence of left leg below knee: Secondary | ICD-10-CM | POA: Diagnosis not present

## 2014-12-01 NOTE — Progress Notes (Signed)
Referred by:  [name], MD [address] [place], [place] [address]  Reason for referral: Right heel ulcer  History of Present Illness  Briana Gould is a 69 y.o. (12/17/1945) female who presents with chief complaint: right heel ulcer.  Onset of symptom occurred ~6 weeks, patient was hospitalized and developed a small wound in heel.  This wound has become progressively larger.  Pain is described as sharp & achy, severity 3-6/10, and associated with manipulating the wound.  Patient has attempted to treat this pain with wound care at [facility].  The patient has no rest pain symptoms.  Atherosclerotic risk factors include: DM, HLD.  Past Medical History  Diagnosis Date  . PAD (peripheral artery disease)     a. L BKA  . Type II diabetes mellitus   . Anemia   . History of blood transfusion 2002    "related to amputation" (08/13/2013)  . End stage renal disease on dialysis     "[facility]; M, W, F" (08/13/2013)  . DVT (deep venous thrombosis)     a. R IJ DVT 08/2013.  Marland Kitchen CVA (cerebral infarction)     a. MRI 08/2013: possible left motor strip infarct with remote pontine infarct.  . Pulmonary HTN     a. Cath 2011 - mod to severe pulmonary hypertension. b. Echo 08/2013: PAP (mild-mod increased).  . Thrombocytopenia   . CAD (coronary artery disease)     a. Severe diffuse diabetic disease by cath 2011, for med rx, not a candidate for CABG or PCI at that time.  . Aortic stenosis     mild  . Syncope     a. 2011, etiology never clear.  . Carotid artery disease     a. Dopp 60/4540: 1-39% LICA< 60-79% RICA borderline >80%.  . Cerebrovascular disease     a. 08/2013 MRA brain: multiple bilateral moderate and high grade intracranial stenoses  . Cardiac arrest 08/14/13    a. Possible arrest in setting of hypotension during dialysis with possible arrest requiring brief CPR, not intubated and was fully intact after episode. Suspected due to low cerebral  perfusion in setting of hypotension. Unclear if any arrhythmia.  . End stage renal disease on dialysis   . Peripheral arterial occlusive disease   . Hypertension   . Diabetes mellitus type 2, uncontrolled, with complications   . DVT (deep venous thrombosis)      Right internal jugular vein  . CAD (coronary artery disease), native coronary artery 2011    Diffuse distal vessel CAD noted at cardiac catheterization  . MI (myocardial infarction)   . Stroke   . GIB (gastrointestinal bleeding)     Past Surgical History  Procedure Laterality Date  . Av fistula placement Left 2011    upper arm  . Below knee leg amputation Left 2002  . Tonsillectomy    . Cataract extraction w/ intraocular lens  implant, bilateral Bilateral 2012-2014  . Esophagogastroduodenoscopy N/A 08/30/2013    Procedure: ESOPHAGOGASTRODUODENOSCOPY (EGD);  Surgeon: Theda Belfast, MD;  Location: [facility] ENDOSCOPY;  Service: Endoscopy;  Laterality: N/A;  . Below knee leg amputation Left   . Av fistula placement Left   . Left and right heart catheterization with coronary angiogram Right 11/22/2013    Procedure: LEFT AND RIGHT HEART CATHETERIZATION WITH CORONARY ANGIOGRAM;  Surgeon: Peter M Swaziland, MD;  Location: [facility] CATH LAB;  Service: Cardiovascular;  Laterality: Right;    History   Social History  .  Marital Status: Married    Spouse Name: N/A  . Number of Children: N/A  . Years of Education: N/A   Occupational History  . Not on file.   Social History Main Topics  . Smoking status: Never Smoker   . Smokeless tobacco: Not on file  . Alcohol Use: No  . Drug Use: No  . Sexual Activity: Not Currently   Other Topics Concern  . Not on file   Social History Narrative   ** Merged History Encounter **       Nonsmoker, no alcohol. Supportive family.    Family History  Problem Relation Age of Onset  . Hypertension Mother   . Hypertension Father   . Heart attack Brother   . Stroke Mother   . Pulmonary embolism  Father     Current Outpatient Prescriptions on File Prior to Visit  Medication Sig Dispense Refill  . acetaminophen (TYLENOL) 325 MG tablet Take 2 tablets (650 mg total) by mouth every 6 (six) hours as needed for moderate pain.    . Amino Acids-Protein Hydrolys (FEEDING SUPPLEMENT, PRO-STAT SUGAR FREE 64,) LIQD Take 30 mLs by mouth 2 (two) times daily at 10 AM and 5 PM. (Patient taking differently: Take 30 mLs by mouth 3 (three) times daily with meals. ) 900 mL 0  . aspirin 81 MG chewable tablet Chew 1 tablet (81 mg total) by mouth daily.    Marland Kitchen b complex-vitamin c-folic acid (NEPHRO-VITE) 0.8 MG TABS tablet Take 1 tablet by mouth daily.    . darbepoetin (ARANESP) 60 MCG/0.3ML SOLN injection Inject 60 mcg into the skin every 7 (seven) days. Wednesdays with dialysis    . docusate sodium 100 MG CAPS Take 100 mg by mouth 2 (two) times daily. 10 capsule 0  . famotidine (PEPCID) 20 MG tablet Take 1 tablet (20 mg total) by mouth at bedtime. 30 tablet 1  . glimepiride (AMARYL) 2 MG tablet TAKE 1 TABLET EVERY DAY WITH BREAKFAST 30 tablet 1  . guaiFENesin (MUCINEX) 600 MG 12 hr tablet Take 1 tablet (600 mg total) by mouth 2 (two) times daily. 30 tablet 0  . metoCLOPramide (REGLAN) 10 MG tablet Take 1 tablet (10 mg total) by mouth 4 (four) times daily -  before meals and at bedtime. 120 tablet 1  . metoprolol tartrate (LOPRESSOR) 12.5 mg TABS tablet 1 tab twice daily Sunday Tuesdays Thursdays Saturdays 60 tablet 1  . midodrine (PROAMATINE) 5 MG tablet Take 1 tablet (5 mg total) by mouth 2 (two) times daily with a meal. 60 tablet 1  . multivitamin (RENA-VIT) TABS tablet Take 1 tablet by mouth at bedtime. 30 tablet 0  . oxyCODONE (OXY IR/ROXICODONE) 5 MG immediate release tablet Take 1-2 tablets (5-10 mg total) by mouth every 4 (four) hours as needed for severe pain. 90 tablet 0  . polyethylene glycol (MIRALAX / GLYCOLAX) packet Take 17 g by mouth daily. 14 each 0  . prednisoLONE acetate (PRED FORTE) 1 %  ophthalmic suspension Place 1 drop into both eyes 3 (three) times daily. 5 mL 0  . sevelamer carbonate (RENVELA) 800 MG tablet Take 1 tablet (800 mg total) by mouth 3 (three) times daily with meals. 90 tablet 1   No current facility-administered medications on file prior to visit.    Allergies  Allergen Reactions  . Phenergan [Promethazine Hcl] Other (See Comments)    Causes patient to" vomit more"  . Vitamin D Analogs Rash    REVIEW OF SYSTEMS:  (Positives checked  otherwise negative)  CARDIOVASCULAR:  []  chest pain, []  chest pressure, []  palpitations, []  shortness of breath when laying flat, []  shortness of breath with exertion,  []  pain in feet when walking, []  pain in feet when laying flat, []  history of blood clot in veins (DVT), []  history of phlebitis, [x]  swelling in legs, []  varicose veins  PULMONARY:  []  productive cough, []  asthma, []  wheezing  NEUROLOGIC:  []  weakness in arms or legs, []  numbness in arms or legs, []  difficulty speaking or slurred speech, []  temporary loss of vision in one eye, []  dizziness  HEMATOLOGIC:  []  bleeding problems, []  problems with blood clotting too easily  MUSCULOSKEL:  []  joint pain, []  joint swelling  GASTROINTEST:  []  vomiting blood, []  blood in stool     GENITOURINARY:  []  burning with urination, []  blood in urine  PSYCHIATRIC:  []  history of major depression  INTEGUMENTARY:  []  rashes, []  ulcers  CONSTITUTIONAL:  []  fever, []  chills  For VQI Use Only  PRE-ADM LIVING: Home  AMB STATUS: Wheelchair  CAD Sx: History of MI, but no symptoms No MI within 6 months  PRIOR CHF: None  STRESS TEST: [x]  No, [ ]  Normal, [ ]  + ischemia, [ ]  + MI, [ ]  Both   Physical Examination  Filed Vitals:   12/01/14 0857 12/01/14 0902  BP: 153/77 158/78  Pulse: 75   Height: 5\' 6"  (1.676 m)   Weight: 165 lb (74.844 kg)   SpO2: 100%    Body mass index is 26.64 kg/(m^2).  General: A&O x 3, WDWN, wheelchair bound  Head:  [place]/AT  Ear/Nose/Throat: Hearing grossly intact, nares w/o erythema or drainage, oropharynx w/o Erythema/Exudate, Mallampati score: 3  Eyes: PERRLA, EOMI  Neck: Supple, no nuchal rigidity, no palpable LAD  Pulmonary: Sym exp, good air movt, CTAB, no rales, rhonchi, & wheezing  Cardiac: RRR, Nl S1, S2, no Murmurs, rubs or gallops  Vascular: Vessel Right Left  Radial Palpable Palpable  Ulnar Not Palpable Not Palpable  Brachial Palpable Palpable  Carotid Palpable, without bruit Palpable, without bruit  Aorta Not palpable N/A  Femoral Palpable Palpable  Popliteal Not palpable Not palpable  PT Not Palpable BKA  DP Not Palpable BKA   Gastrointestinal: soft, NTND, -G/R, - HSM, - masses, - CVAT B  Musculoskeletal: M/S 5/5 throughout except L BKA, R lateral heel ulcer: clean without drainage for cellulitis, L BKA stump wrapped (dressing not taken off at pt's request)  Neurologic: CN 2-12 intact , Pain and light touch intact in extremities , Motor exam as listed above  Psychiatric: Judgment intact, Mood & affect appropriate for pt's clinical situation  Dermatologic: See M/S exam for extremity exam, no rashes otherwise noted  Lymph : No Cervical, Axillary, or Inguinal lymphadenopathy   Non-Invasive Vascular Imaging  ABI (Date: 11/18/14)  R: Oracle, DP: mono, PT: mono, TBI: 0.20  L: BKA  Outside Studies/Documentation 2 pages of outside documents were reviewed including: BLE ABI.  Medical Decision Making  Briana Gould is a 69 y.o. female who presents with: BLE critical limb ischemia, 3v CAD (non-surgical candidate), AS   I doubt this patient is a surgical candidate with her significant co-morbidities.  She reportedly has coded 3 times to date.  I discussed with the patient the natural history of critical limb ischemia: 25% require amputation in one year, 50% are able to maintain their limbs in one year, and 25-30% die in one year due to comorbidities.  Given the limb threatening  status of this patient, I recommend an aggressive work up including proceeding with an: Aortogram, Bilateral runoff and intervention RLE. I discussed with the patient the nature of angiographic procedures, especially the limited patencies of any endovascular intervention. The patient is aware of that the risks of an angiographic procedure include but are not limited to: bleeding, infection, access site complications, embolization, rupture of treated vessel, dissection, possible need for emergent surgical intervention, and possible need for surgical procedures to treat the patient's pathology.  The patient and family are going to consider proceeding.  I discussed in depth with the patient the nature of atherosclerosis, and emphasized the importance of maximal medical management including strict control of blood pressure, blood glucose, and lipid levels, antiplatelet agents, obtaining regular exercise, and cessation of smoking.  The patient is aware that without maximal medical management the underlying atherosclerotic disease process will progress, limiting the benefit of any interventions. The patient is currently on a statin:  ASA. The patient is currently not on an anti-platelet.  I will defer to her Cardiologist starting such if indicated.  Thank you for allowing [place] to participate in this patient's care.  Leonides Sake, MD [facility] Office: [phone] Pager: [phone]  12/01/2014, 9:45 AM

## 2014-12-07 ENCOUNTER — Inpatient Hospital Stay (HOSPITAL_COMMUNITY)
Admission: EM | Admit: 2014-12-07 | Discharge: 2014-12-21 | DRG: 602 | Disposition: A | Discharge status: Hospice | Payer: Medicare Other | Attending: Family Medicine | Admitting: Family Medicine

## 2014-12-07 ENCOUNTER — Encounter: Payer: Medicare Other | Admitting: Vascular Surgery

## 2014-12-07 ENCOUNTER — Encounter (HOSPITAL_COMMUNITY): Payer: Self-pay | Admitting: Emergency Medicine

## 2014-12-07 DIAGNOSIS — Z880 Allergy status to penicillin: Secondary | ICD-10-CM | POA: Diagnosis not present

## 2014-12-07 DIAGNOSIS — M79609 Pain in unspecified limb: Secondary | ICD-10-CM | POA: Diagnosis not present

## 2014-12-07 DIAGNOSIS — L0390 Cellulitis, unspecified: Secondary | ICD-10-CM | POA: Diagnosis present

## 2014-12-07 DIAGNOSIS — N2581 Secondary hyperparathyroidism of renal origin: Secondary | ICD-10-CM | POA: Diagnosis present

## 2014-12-07 DIAGNOSIS — T8789 Other complications of amputation stump: Secondary | ICD-10-CM | POA: Diagnosis present

## 2014-12-07 DIAGNOSIS — Z888 Allergy status to other drugs, medicaments and biological substances status: Secondary | ICD-10-CM

## 2014-12-07 DIAGNOSIS — I998 Other disorder of circulatory system: Secondary | ICD-10-CM | POA: Diagnosis present

## 2014-12-07 DIAGNOSIS — Z79899 Other long term (current) drug therapy: Secondary | ICD-10-CM

## 2014-12-07 DIAGNOSIS — Z8674 Personal history of sudden cardiac arrest: Secondary | ICD-10-CM | POA: Diagnosis not present

## 2014-12-07 DIAGNOSIS — Z9842 Cataract extraction status, left eye: Secondary | ICD-10-CM | POA: Diagnosis not present

## 2014-12-07 DIAGNOSIS — D649 Anemia, unspecified: Secondary | ICD-10-CM | POA: Diagnosis present

## 2014-12-07 DIAGNOSIS — Z79891 Long term (current) use of opiate analgesic: Secondary | ICD-10-CM

## 2014-12-07 DIAGNOSIS — D72829 Elevated white blood cell count, unspecified: Secondary | ICD-10-CM | POA: Diagnosis present

## 2014-12-07 DIAGNOSIS — L97519 Non-pressure chronic ulcer of other part of right foot with unspecified severity: Secondary | ICD-10-CM

## 2014-12-07 DIAGNOSIS — I639 Cerebral infarction, unspecified: Secondary | ICD-10-CM | POA: Diagnosis present

## 2014-12-07 DIAGNOSIS — Y835 Amputation of limb(s) as the cause of abnormal reaction of the patient, or of later complication, without mention of misadventure at the time of the procedure: Secondary | ICD-10-CM | POA: Diagnosis present

## 2014-12-07 DIAGNOSIS — I9589 Other hypotension: Secondary | ICD-10-CM | POA: Diagnosis present

## 2014-12-07 DIAGNOSIS — I2510 Atherosclerotic heart disease of native coronary artery without angina pectoris: Secondary | ICD-10-CM | POA: Diagnosis present

## 2014-12-07 DIAGNOSIS — I999 Unspecified disorder of circulatory system: Secondary | ICD-10-CM | POA: Diagnosis not present

## 2014-12-07 DIAGNOSIS — Z66 Do not resuscitate: Secondary | ICD-10-CM | POA: Diagnosis present

## 2014-12-07 DIAGNOSIS — Z961 Presence of intraocular lens: Secondary | ICD-10-CM | POA: Diagnosis present

## 2014-12-07 DIAGNOSIS — R400 Somnolence: Secondary | ICD-10-CM | POA: Diagnosis not present

## 2014-12-07 DIAGNOSIS — M79672 Pain in left foot: Secondary | ICD-10-CM | POA: Diagnosis not present

## 2014-12-07 DIAGNOSIS — N186 End stage renal disease: Secondary | ICD-10-CM | POA: Diagnosis present

## 2014-12-07 DIAGNOSIS — I509 Heart failure, unspecified: Secondary | ICD-10-CM | POA: Diagnosis present

## 2014-12-07 DIAGNOSIS — Z89612 Acquired absence of left leg above knee: Secondary | ICD-10-CM | POA: Diagnosis not present

## 2014-12-07 DIAGNOSIS — M79671 Pain in right foot: Secondary | ICD-10-CM

## 2014-12-07 DIAGNOSIS — I70234 Atherosclerosis of native arteries of right leg with ulceration of heel and midfoot: Secondary | ICD-10-CM

## 2014-12-07 DIAGNOSIS — Z9841 Cataract extraction status, right eye: Secondary | ICD-10-CM

## 2014-12-07 DIAGNOSIS — E11621 Type 2 diabetes mellitus with foot ulcer: Secondary | ICD-10-CM

## 2014-12-07 DIAGNOSIS — I252 Old myocardial infarction: Secondary | ICD-10-CM

## 2014-12-07 DIAGNOSIS — E1322 Other specified diabetes mellitus with diabetic chronic kidney disease: Secondary | ICD-10-CM | POA: Diagnosis not present

## 2014-12-07 DIAGNOSIS — G9340 Encephalopathy, unspecified: Secondary | ICD-10-CM | POA: Diagnosis present

## 2014-12-07 DIAGNOSIS — Z515 Encounter for palliative care: Secondary | ICD-10-CM

## 2014-12-07 DIAGNOSIS — L97419 Non-pressure chronic ulcer of right heel and midfoot with unspecified severity: Secondary | ICD-10-CM | POA: Diagnosis present

## 2014-12-07 DIAGNOSIS — M869 Osteomyelitis, unspecified: Secondary | ICD-10-CM | POA: Diagnosis present

## 2014-12-07 DIAGNOSIS — I739 Peripheral vascular disease, unspecified: Secondary | ICD-10-CM | POA: Diagnosis present

## 2014-12-07 DIAGNOSIS — L03115 Cellulitis of right lower limb: Secondary | ICD-10-CM | POA: Diagnosis present

## 2014-12-07 DIAGNOSIS — E1122 Type 2 diabetes mellitus with diabetic chronic kidney disease: Secondary | ICD-10-CM | POA: Diagnosis present

## 2014-12-07 DIAGNOSIS — I350 Nonrheumatic aortic (valve) stenosis: Secondary | ICD-10-CM | POA: Diagnosis present

## 2014-12-07 DIAGNOSIS — Z992 Dependence on renal dialysis: Secondary | ICD-10-CM

## 2014-12-07 DIAGNOSIS — Z7982 Long term (current) use of aspirin: Secondary | ICD-10-CM

## 2014-12-07 DIAGNOSIS — I120 Hypertensive chronic kidney disease with stage 5 chronic kidney disease or end stage renal disease: Secondary | ICD-10-CM | POA: Diagnosis present

## 2014-12-07 DIAGNOSIS — Z8673 Personal history of transient ischemic attack (TIA), and cerebral infarction without residual deficits: Secondary | ICD-10-CM | POA: Diagnosis not present

## 2014-12-07 DIAGNOSIS — L03119 Cellulitis of unspecified part of limb: Secondary | ICD-10-CM | POA: Diagnosis not present

## 2014-12-07 HISTORY — DX: Cellulitis of unspecified part of limb: L03.119

## 2014-12-07 HISTORY — DX: Heart failure, unspecified: I50.9

## 2014-12-07 HISTORY — DX: Cutaneous abscess of limb, unspecified: L02.419

## 2014-12-07 LAB — CBC WITH DIFFERENTIAL/PLATELET
BASOS PCT: 0 % (ref 0–1)
Basophils Absolute: 0 10*3/uL (ref 0.0–0.1)
Eosinophils Absolute: 0 10*3/uL (ref 0.0–0.7)
Eosinophils Relative: 0 % (ref 0–5)
HCT: 34.1 % — ABNORMAL LOW (ref 36.0–46.0)
Hemoglobin: 11.3 g/dL — ABNORMAL LOW (ref 12.0–15.0)
LYMPHS ABS: 0.7 10*3/uL (ref 0.7–4.0)
LYMPHS PCT: 4 % — AB (ref 12–46)
MCH: 31.8 pg (ref 26.0–34.0)
MCHC: 33.1 g/dL (ref 30.0–36.0)
MCV: 96.1 fL (ref 78.0–100.0)
Monocytes Absolute: 0.8 10*3/uL (ref 0.1–1.0)
Monocytes Relative: 5 % (ref 3–12)
NEUTROS PCT: 91 % — AB (ref 43–77)
Neutro Abs: 15.1 10*3/uL — ABNORMAL HIGH (ref 1.7–7.7)
Platelets: 225 10*3/uL (ref 150–400)
RBC: 3.55 MIL/uL — AB (ref 3.87–5.11)
RDW: 17.7 % — AB (ref 11.5–15.5)
WBC: 16.6 10*3/uL — ABNORMAL HIGH (ref 4.0–10.5)

## 2014-12-07 LAB — COMPREHENSIVE METABOLIC PANEL
ALBUMIN: 3.3 g/dL — AB (ref 3.5–5.2)
ALK PHOS: 118 U/L — AB (ref 39–117)
ALT: 20 U/L (ref 0–35)
ANION GAP: 17 — AB (ref 5–15)
AST: 25 U/L (ref 0–37)
BILIRUBIN TOTAL: 1.3 mg/dL — AB (ref 0.3–1.2)
BUN: 50 mg/dL — AB (ref 6–23)
CHLORIDE: 94 mmol/L — AB (ref 96–112)
CO2: 23 mmol/L (ref 19–32)
Calcium: 10.1 mg/dL (ref 8.4–10.5)
Creatinine, Ser: 6.69 mg/dL — ABNORMAL HIGH (ref 0.50–1.10)
GFR calc Af Amer: 7 mL/min — ABNORMAL LOW (ref >90)
GFR calc non Af Amer: 6 mL/min — ABNORMAL LOW (ref >90)
GLUCOSE: 217 mg/dL — AB (ref 70–99)
Potassium: 4.2 mmol/L (ref 3.5–5.1)
Sodium: 134 mmol/L — ABNORMAL LOW (ref 135–145)
TOTAL PROTEIN: 7 g/dL (ref 6.0–8.3)

## 2014-12-07 LAB — GLUCOSE, CAPILLARY: Glucose-Capillary: 86 mg/dL (ref 70–99)

## 2014-12-07 LAB — I-STAT CG4 LACTIC ACID, ED: Lactic Acid, Venous: 2.02 mmol/L (ref 0.5–2.0)

## 2014-12-07 MED ORDER — ONDANSETRON HCL 4 MG PO TABS: 4.0000 mg | ORAL_TABLET | Freq: Four times a day (QID) | ORAL | Status: DC | PRN

## 2014-12-07 MED ORDER — ONDANSETRON HCL 4 MG/2ML IJ SOLN: 4.0000 mg | Freq: Four times a day (QID) | INTRAMUSCULAR | Status: DC | PRN

## 2014-12-07 MED ORDER — PRO-STAT SUGAR FREE PO LIQD
30.0000 mL | Freq: Two times a day (BID) | ORAL | Status: DC
  Administered 2014-12-08: 30 mL via ORAL
  Filled 2014-12-07 (×2): qty 30

## 2014-12-07 MED ORDER — RENA-VITE PO TABS: 1.0000 | ORAL_TABLET | Freq: Every day | ORAL | Status: DC

## 2014-12-07 MED ORDER — SODIUM CHLORIDE 0.9 % IJ SOLN: 3.0000 mL | INTRAMUSCULAR | Status: DC | PRN

## 2014-12-07 MED ORDER — ACETAMINOPHEN 325 MG PO TABS
ORAL_TABLET | ORAL | Status: AC
  Filled 2014-12-07: qty 2

## 2014-12-07 MED ORDER — MIDODRINE HCL 5 MG PO TABS
ORAL_TABLET | ORAL | Status: AC
  Filled 2014-12-07: qty 1

## 2014-12-07 MED ORDER — DEXTROSE 5 % IV SOLN
1.0000 g | INTRAVENOUS | Status: DC
  Administered 2014-12-07: 1 g via INTRAVENOUS
  Filled 2014-12-07 (×2): qty 1

## 2014-12-07 MED ORDER — INSULIN ASPART 100 UNIT/ML ~~LOC~~ SOLN
0.0000 [IU] | Freq: Three times a day (TID) | SUBCUTANEOUS | Status: DC
  Administered 2014-12-08 (×2): 2 [IU] via SUBCUTANEOUS
  Administered 2014-12-08 – 2014-12-09 (×2): 1 [IU] via SUBCUTANEOUS
  Administered 2014-12-10: 5 [IU] via SUBCUTANEOUS
  Administered 2014-12-10 – 2014-12-11 (×2): 3 [IU] via SUBCUTANEOUS
  Administered 2014-12-11: 2 [IU] via SUBCUTANEOUS
  Administered 2014-12-11: 3 [IU] via SUBCUTANEOUS
  Administered 2014-12-12 – 2014-12-13 (×4): 2 [IU] via SUBCUTANEOUS
  Administered 2014-12-13: 3 [IU] via SUBCUTANEOUS
  Administered 2014-12-14 (×2): 2 [IU] via SUBCUTANEOUS
  Administered 2014-12-15 (×2): 1 [IU] via SUBCUTANEOUS
  Administered 2014-12-16 – 2014-12-17 (×2): 2 [IU] via SUBCUTANEOUS
  Administered 2014-12-17: 3 [IU] via SUBCUTANEOUS
  Administered 2014-12-17 – 2014-12-18 (×2): 2 [IU] via SUBCUTANEOUS
  Administered 2014-12-18: 3 [IU] via SUBCUTANEOUS
  Administered 2014-12-19: 2 [IU] via SUBCUTANEOUS
  Administered 2014-12-19: 1 [IU] via SUBCUTANEOUS
  Administered 2014-12-19: 2 [IU] via SUBCUTANEOUS
  Administered 2014-12-20: 3 [IU] via SUBCUTANEOUS

## 2014-12-07 MED ORDER — ASPIRIN 81 MG PO CHEW
81.0000 mg | CHEWABLE_TABLET | Freq: Every day | ORAL | Status: DC
  Administered 2014-12-07 – 2014-12-18 (×9): 81 mg via ORAL
  Filled 2014-12-07 (×12): qty 1

## 2014-12-07 MED ORDER — INSULIN ASPART 100 UNIT/ML ~~LOC~~ SOLN
0.0000 [IU] | Freq: Every day | SUBCUTANEOUS | Status: DC
  Administered 2014-12-11 – 2014-12-16 (×2): 2 [IU] via SUBCUTANEOUS

## 2014-12-07 MED ORDER — METRONIDAZOLE IN NACL 5-0.79 MG/ML-% IV SOLN
500.0000 mg | Freq: Three times a day (TID) | INTRAVENOUS | Status: DC
  Administered 2014-12-07 – 2014-12-08 (×2): 500 mg via INTRAVENOUS
  Filled 2014-12-07 (×4): qty 100

## 2014-12-07 MED ORDER — ACETAMINOPHEN 325 MG PO TABS
650.0000 mg | ORAL_TABLET | Freq: Once | ORAL | Status: AC
  Administered 2014-12-07: 650 mg via ORAL
  Filled 2014-12-07: qty 2

## 2014-12-07 MED ORDER — METOCLOPRAMIDE HCL 10 MG PO TABS
10.0000 mg | ORAL_TABLET | Freq: Three times a day (TID) | ORAL | Status: DC
  Administered 2014-12-07 – 2014-12-14 (×26): 10 mg via ORAL
  Filled 2014-12-07 (×31): qty 1

## 2014-12-07 MED ORDER — OXYCODONE HCL 5 MG PO TABS: 5.0000 mg | ORAL_TABLET | ORAL | Status: DC | PRN

## 2014-12-07 MED ORDER — DOCUSATE SODIUM 100 MG PO CAPS
100.0000 mg | ORAL_CAPSULE | Freq: Two times a day (BID) | ORAL | Status: DC
  Administered 2014-12-08 – 2014-12-21 (×20): 100 mg via ORAL
  Filled 2014-12-07 (×35): qty 1

## 2014-12-07 MED ORDER — HYDROCODONE-ACETAMINOPHEN 5-325 MG PO TABS
1.0000 | ORAL_TABLET | Freq: Once | ORAL | Status: DC
Start: 2014-12-07 — End: 2014-12-07
  Filled 2014-12-07: qty 1

## 2014-12-07 MED ORDER — SODIUM CHLORIDE 0.9 % IJ SOLN
3.0000 mL | Freq: Two times a day (BID) | INTRAMUSCULAR | Status: DC
  Administered 2014-12-07 – 2014-12-18 (×17): 3 mL via INTRAVENOUS

## 2014-12-07 MED ORDER — ACETAMINOPHEN 325 MG PO TABS
650.0000 mg | ORAL_TABLET | Freq: Four times a day (QID) | ORAL | Status: DC | PRN
  Administered 2014-12-07 – 2014-12-16 (×16): 650 mg via ORAL
  Filled 2014-12-07 (×13): qty 2

## 2014-12-07 MED ORDER — RENA-VITE PO TABS
1.0000 | ORAL_TABLET | Freq: Every day | ORAL | Status: DC
  Administered 2014-12-07 – 2014-12-18 (×11): 1 via ORAL
  Filled 2014-12-07 (×14): qty 1

## 2014-12-07 MED ORDER — POLYETHYLENE GLYCOL 3350 17 G PO PACK
17.0000 g | PACK | Freq: Every day | ORAL | Status: DC
  Administered 2014-12-07 – 2014-12-12 (×6): 17 g via ORAL
  Filled 2014-12-07 (×13): qty 1

## 2014-12-07 MED ORDER — MORPHINE SULFATE 2 MG/ML IJ SOLN
1.0000 mg | INTRAMUSCULAR | Status: DC | PRN
  Administered 2014-12-07 – 2014-12-09 (×3): 1 mg via INTRAVENOUS
  Filled 2014-12-07 (×3): qty 1

## 2014-12-07 MED ORDER — SEVELAMER CARBONATE 800 MG PO TABS
800.0000 mg | ORAL_TABLET | Freq: Three times a day (TID) | ORAL | Status: DC
  Administered 2014-12-08 – 2014-12-19 (×25): 800 mg via ORAL
  Filled 2014-12-07 (×39): qty 1

## 2014-12-07 MED ORDER — CALCITRIOL 0.5 MCG PO CAPS
1.2500 ug | ORAL_CAPSULE | ORAL | Status: DC
  Administered 2014-12-07 – 2014-12-12 (×3): 1.25 ug via ORAL
  Filled 2014-12-07 (×6): qty 1

## 2014-12-07 MED ORDER — VANCOMYCIN HCL IN DEXTROSE 1-5 GM/200ML-% IV SOLN
1000.0000 mg | Freq: Once | INTRAVENOUS | Status: AC
  Administered 2014-12-07: 1000 mg via INTRAVENOUS
  Filled 2014-12-07: qty 200

## 2014-12-07 MED ORDER — PREDNISOLONE ACETATE 1 % OP SUSP
1.0000 [drp] | Freq: Three times a day (TID) | OPHTHALMIC | Status: DC
  Filled 2014-12-07: qty 1

## 2014-12-07 MED ORDER — SODIUM CHLORIDE 0.9 % IV SOLN: 250.0000 mL | INTRAVENOUS | Status: DC | PRN

## 2014-12-07 MED ORDER — ACETAMINOPHEN 650 MG RE SUPP: 650.0000 mg | Freq: Four times a day (QID) | RECTAL | Status: DC | PRN

## 2014-12-07 MED ORDER — FAMOTIDINE 20 MG PO TABS
20.0000 mg | ORAL_TABLET | Freq: Every day | ORAL | Status: DC
  Administered 2014-12-07 – 2014-12-20 (×13): 20 mg via ORAL
  Filled 2014-12-07 (×16): qty 1

## 2014-12-07 MED ORDER — MIDODRINE HCL 5 MG PO TABS
5.0000 mg | ORAL_TABLET | Freq: Two times a day (BID) | ORAL | Status: DC
  Administered 2014-12-07 – 2014-12-20 (×22): 5 mg via ORAL
  Filled 2014-12-07 (×29): qty 1

## 2014-12-07 MED ORDER — HEPARIN SODIUM (PORCINE) 5000 UNIT/ML IJ SOLN
5000.0000 [IU] | Freq: Three times a day (TID) | INTRAMUSCULAR | Status: DC
  Administered 2014-12-07 – 2014-12-19 (×32): 5000 [IU] via SUBCUTANEOUS
  Filled 2014-12-07 (×41): qty 1

## 2014-12-07 NOTE — ED Notes (Signed)
Per [facility], pt from home, supposed to go to dialysis today but felt too weak. Pt burned her leg with hot tea on Saturday, has blister on right upper leg. Pt also has a BKA to left leg with a bandage on, ulcer on stump that's being treated by home health. Pt also states she has an ulcer on bottom of right foot and new redness has developed on right lower leg since yesterday. Pt is AAOX4, in NAD. Respirations equal and unlabored.

## 2014-12-07 NOTE — ED Provider Notes (Signed)
CSN: [id number]     Arrival date & time 12/07/14  1104 History   First MD Initiated Contact with Patient 12/07/14 1135     Chief Complaint  Patient presents with  . Burn  . Wound Infection     (Consider location/radiation/quality/duration/timing/severity/associated sxs/prior Treatment) Patient is a 69 y.o. female presenting with leg pain. The history is provided by the patient (the pt started with redness and swelling of right lower leg today).  Leg Pain Location:  Leg Injury: yes (mild burn to thigh)   Mechanism of injury comment:  None Leg location:  R leg Pain details:    Quality:  Aching   Radiates to:  Does not radiate   Severity:  Mild Associated symptoms: no back pain and no fatigue     Past Medical History  Diagnosis Date  . PAD (peripheral artery disease)     a. L BKA  . Type II diabetes mellitus   . Anemia   . History of blood transfusion 2002    "related to amputation" (08/13/2013)  . End stage renal disease on dialysis     "[facility]; M, W, F" (08/13/2013)  . DVT (deep venous thrombosis)     a. R IJ DVT 08/2013.  Marland Kitchen CVA (cerebral infarction)     a. MRI 08/2013: possible left motor strip infarct with remote pontine infarct.  . Pulmonary HTN     a. Cath 2011 - mod to severe pulmonary hypertension. b. Echo 08/2013: PAP (mild-mod increased).  . Thrombocytopenia   . CAD (coronary artery disease)     a. Severe diffuse diabetic disease by cath 2011, for med rx, not a candidate for CABG or PCI at that time.  . Aortic stenosis     mild  . Syncope     a. 2011, etiology never clear.  . Carotid artery disease     a. Dopp 82/9562: 1-39% LICA< 60-79% RICA borderline >80%.  . Cerebrovascular disease     a. 08/2013 MRA brain: multiple bilateral moderate and high grade intracranial stenoses  . Cardiac arrest 08/14/13    a. Possible arrest in setting of hypotension during dialysis with possible arrest requiring brief CPR, not intubated and was fully  intact after episode. Suspected due to low cerebral perfusion in setting of hypotension. Unclear if any arrhythmia.  . End stage renal disease on dialysis   . Peripheral arterial occlusive disease   . Hypertension   . Diabetes mellitus type 2, uncontrolled, with complications   . DVT (deep venous thrombosis)      Right internal jugular vein  . CAD (coronary artery disease), native coronary artery 2011    Diffuse distal vessel CAD noted at cardiac catheterization  . MI (myocardial infarction)   . Stroke   . GIB (gastrointestinal bleeding)    Past Surgical History  Procedure Laterality Date  . Av fistula placement Left 2011    upper arm  . Below knee leg amputation Left 2002  . Tonsillectomy    . Cataract extraction w/ intraocular lens  implant, bilateral Bilateral 2012-2014  . Esophagogastroduodenoscopy N/A 08/30/2013    Procedure: ESOPHAGOGASTRODUODENOSCOPY (EGD);  Surgeon: Theda Belfast, MD;  Location: [facility] ENDOSCOPY;  Service: Endoscopy;  Laterality: N/A;  . Below knee leg amputation Left   . Av fistula placement Left   . Left and right heart catheterization with coronary angiogram Right 11/22/2013    Procedure: LEFT AND RIGHT HEART CATHETERIZATION WITH CORONARY ANGIOGRAM;  Surgeon: Peter M Swaziland, MD;  Location: [facility] CATH LAB;  Service: Cardiovascular;  Laterality: Right;   Family History  Problem Relation Age of Onset  . Hypertension Mother   . Hypertension Father   . Heart attack Brother   . Stroke Mother   . Pulmonary embolism Father    History  Substance Use Topics  . Smoking status: Never Smoker   . Smokeless tobacco: Not on file  . Alcohol Use: No   OB History    No data available     Review of Systems  Constitutional: Negative for appetite change and fatigue.  HENT: Negative for congestion, ear discharge and sinus pressure.   Eyes: Negative for discharge.  Respiratory: Negative for cough.   Cardiovascular: Negative for chest pain.  Gastrointestinal: Negative  for abdominal pain and diarrhea.  Genitourinary: Negative for frequency and hematuria.  Musculoskeletal: Negative for back pain.  Skin: Positive for rash.  Neurological: Negative for seizures and headaches.  Psychiatric/Behavioral: Negative for hallucinations.      Allergies  Phenergan and Vitamin d analogs  Home Medications   Prior to Admission medications   Medication Sig Start Date End Date Taking? Authorizing Provider  acetaminophen (TYLENOL) 325 MG tablet Take 2 tablets (650 mg total) by mouth every 6 (six) hours as needed for moderate pain. 07/07/14   Ripudeep Jenna LuoK Rai, MD  Amino Acids-Protein Hydrolys (FEEDING SUPPLEMENT, PRO-STAT SUGAR FREE 64,) LIQD Take 30 mLs by mouth 2 (two) times daily at 10 AM and 5 PM. Patient taking differently: Take 30 mLs by mouth 3 (three) times daily with meals.  07/07/14   Ripudeep Jenna LuoK Rai, MD  aspirin 81 MG chewable tablet Chew 1 tablet (81 mg total) by mouth daily. 08/19/14   Mcarthur Rossettianiel J Angiulli, PA-C  b complex-vitamin c-folic acid (NEPHRO-VITE) 0.8 MG TABS tablet Take 1 tablet by mouth daily.    Historical Provider, MD  darbepoetin (ARANESP) 60 MCG/0.3ML SOLN injection Inject 60 mcg into the skin every 7 (seven) days. Wednesdays with dialysis    Historical Provider, MD  docusate sodium 100 MG CAPS Take 100 mg by mouth 2 (two) times daily. 08/19/14   Mcarthur Rossettianiel J Angiulli, PA-C  famotidine (PEPCID) 20 MG tablet Take 1 tablet (20 mg total) by mouth at bedtime. 08/19/14   Mcarthur Rossettianiel J Angiulli, PA-C  glimepiride (AMARYL) 2 MG tablet TAKE 1 TABLET EVERY DAY WITH BREAKFAST 10/31/14   Ranelle OysterZachary T Swartz, MD  guaiFENesin (MUCINEX) 600 MG 12 hr tablet Take 1 tablet (600 mg total) by mouth 2 (two) times daily. 12/17/13   Calvert CantorSaima Rizwan, MD  metoCLOPramide (REGLAN) 10 MG tablet Take 1 tablet (10 mg total) by mouth 4 (four) times daily -  before meals and at bedtime. 08/19/14   Mcarthur Rossettianiel J Angiulli, PA-C  metoprolol tartrate (LOPRESSOR) 12.5 mg TABS tablet 1 tab twice daily Sunday  Tuesdays Thursdays Saturdays 08/19/14   Mcarthur Rossettianiel J Angiulli, PA-C  midodrine (PROAMATINE) 5 MG tablet Take 1 tablet (5 mg total) by mouth 2 (two) times daily with a meal. 08/19/14   Mcarthur Rossettianiel J Angiulli, PA-C  multivitamin (RENA-VIT) TABS tablet Take 1 tablet by mouth at bedtime. 08/19/14   Mcarthur Rossettianiel J Angiulli, PA-C  oxyCODONE (OXY IR/ROXICODONE) 5 MG immediate release tablet Take 1-2 tablets (5-10 mg total) by mouth every 4 (four) hours as needed for severe pain. 08/19/14   Mcarthur Rossettianiel J Angiulli, PA-C  polyethylene glycol (MIRALAX / GLYCOLAX) packet Take 17 g by mouth daily. 08/19/14   Mcarthur Rossettianiel J Angiulli, PA-C  prednisoLONE acetate (PRED FORTE) 1 % ophthalmic  suspension Place 1 drop into both eyes 3 (three) times daily. 12/17/13   Calvert Cantor, MD  sevelamer carbonate (RENVELA) 800 MG tablet Take 1 tablet (800 mg total) by mouth 3 (three) times daily with meals. 08/19/14   Mcarthur Rossetti Angiulli, PA-C   BP 95/74 mmHg  Pulse 84  Temp(Src) 99.6 F (37.6 C) (Oral)  Resp 15  SpO2 99% Physical Exam  Constitutional: She is oriented to person, place, and time. She appears well-developed.  HENT:  Head: Normocephalic.  Eyes: Conjunctivae and EOM are normal. No scleral icterus.  Neck: Neck supple. No thyromegaly present.  Cardiovascular: Normal rate and regular rhythm.  Exam reveals no gallop and no friction rub.   No murmur heard. Pulmonary/Chest: No stridor. She has no wheezes. She has no rales. She exhibits no tenderness.  Abdominal: She exhibits no distension. There is no tenderness. There is no rebound.  Musculoskeletal:  Pt has ulcers on her right foot with a rash to her right lower leg consistent with cellulitis.   Left leg has a bka  Lymphadenopathy:    She has no cervical adenopathy.  Neurological: She is oriented to person, place, and time. She exhibits normal muscle tone. Coordination normal.  Skin: No rash noted. No erythema.  Psychiatric: She has a normal mood and affect. Her behavior is normal.     ED Course  Procedures (including critical care time) Labs Review Labs Reviewed  CBC WITH DIFFERENTIAL/PLATELET - Abnormal; Notable for the following:    WBC 16.6 (*)    RBC 3.55 (*)    Hemoglobin 11.3 (*)    HCT 34.1 (*)    RDW 17.7 (*)    Neutrophils Relative % 91 (*)    Neutro Abs 15.1 (*)    Lymphocytes Relative 4 (*)    All other components within normal limits  COMPREHENSIVE METABOLIC PANEL - Abnormal; Notable for the following:    Sodium 134 (*)    Chloride 94 (*)    Glucose, Bld 217 (*)    BUN 50 (*)    Creatinine, Ser 6.69 (*)    Albumin 3.3 (*)    Alkaline Phosphatase 118 (*)    Total Bilirubin 1.3 (*)    GFR calc non Af Amer 6 (*)    GFR calc Af Amer 7 (*)    Anion gap 17 (*)    All other components within normal limits  I-STAT CG4 LACTIC ACID, ED - Abnormal; Notable for the following:    Lactic Acid, Venous 2.02 (*)    All other components within normal limits  CULTURE, BLOOD (ROUTINE X 2)  CULTURE, BLOOD (ROUTINE X 2)    Imaging Review No results found.   EKG Interpretation None      MDM   Final diagnoses:  Cellulitis of right lower extremity    admit    Bethann Berkshire, MD 12/07/14 1504

## 2014-12-07 NOTE — H&P (Addendum)
[facility] History and Physical  Izabela S Ethier JXB:[id number] DOB: 08-04-1946 DOA: 12/07/2014  Referring physician: er PCP: Laurena SlimmerLARK,PRESTON S, MD   Chief Complaint: weakness  HPI: Briana Gould HeckValdes is a 69 y.o. female  With multiple chronic medical issues.  She came to the ER after she was too weak to go to dialysis.  Her right leg had an ulcer with spreading redness.    She was seen by Dr. Imogene Burnhen 3/24 with right heel ulcer, referred by her wound care center.  She developed the wound about 6 weeks ago and it has gradually become larger.  She has been going to wound care at [facility].  Dr. Imogene Burnhen stated that he doubts she is a surgical candidate as she has coded 3 times during previous procedures.   He recommends an aortogram, bilateral runoff and intervention.   Wound care doctor was sending to get MRI to r/o osteomyelitis  She also spilled hot tea on her right right upper thigh Saturday and has a blister  +nasuea last PM, no fever   Review of Systems:  All systems reviewed, negative unless stated above    Past Medical History  Diagnosis Date  . PAD (peripheral artery disease)     a. L BKA  . Type II diabetes mellitus   . Anemia   . History of blood transfusion 2002    "related to amputation" (08/13/2013)  . End stage renal disease on dialysis     [facility]" (08/13/2013)  . DVT (deep venous thrombosis)     a. R IJ DVT 08/2013.  Marland Kitchen. CVA (cerebral infarction)     a. MRI 08/2013: possible left motor strip infarct with remote pontine infarct.  . Pulmonary HTN     a. Cath 2011 - mod to severe pulmonary hypertension. b. Echo 08/2013: PAP 45mmHg (mild-mod increased).  . Thrombocytopenia   . CAD (coronary artery disease)     a. Severe diffuse diabetic disease by cath 2011, for med rx, not a candidate for CABG or PCI at that time.  . Aortic stenosis     mild  . Syncope     a. 2011, etiology never clear.  . Carotid artery disease     a. Dopp  13/086512/2014: 1-39% LICA< 60-79% RICA borderline >80%.  . Cerebrovascular disease     a. 08/2013 MRA brain: multiple bilateral moderate and high grade intracranial stenoses  . Cardiac arrest 08/14/13    a. Possible arrest in setting of hypotension during dialysis with possible arrest requiring brief CPR, not intubated and was fully intact after episode. Suspected due to low cerebral perfusion in setting of hypotension. Unclear if any arrhythmia.  . End stage renal disease on dialysis   . Peripheral arterial occlusive disease   . Hypertension   . Diabetes mellitus type 2, uncontrolled, with complications   . DVT (deep venous thrombosis)      Right internal jugular vein  . CAD (coronary artery disease), native coronary artery 2011    Diffuse distal vessel CAD noted at cardiac catheterization  . MI (myocardial infarction)   . Stroke   . GIB (gastrointestinal bleeding)    Past Surgical History  Procedure Laterality Date  . Av fistula placement Left 2011    upper arm  . Below knee leg amputation Left 2002  . Tonsillectomy    . Cataract extraction w/ intraocular lens  implant, bilateral Bilateral 2012-2014  . Esophagogastroduodenoscopy N/A 08/30/2013    Procedure: ESOPHAGOGASTRODUODENOSCOPY (EGD);  Surgeon: Theda Belfast, MD;  Location: [facility] ENDOSCOPY;  Service: Endoscopy;  Laterality: N/A;  . Below knee leg amputation Left   . Av fistula placement Left   . Left and right heart catheterization with coronary angiogram Right 11/22/2013    Procedure: LEFT AND RIGHT HEART CATHETERIZATION WITH CORONARY ANGIOGRAM;  Surgeon: Peter M Swaziland, MD;  Location: [facility] CATH LAB;  Service: Cardiovascular;  Laterality: Right;   Social History:  reports that she has never smoked. She does not have any smokeless tobacco history on file. She reports that she does not drink alcohol or use illicit drugs.  Allergies  Allergen Reactions  . Phenergan [Promethazine Hcl] Other (See Comments)    Causes patient to" vomit  more"  . Vitamin D Analogs Rash    Family History  Problem Relation Age of Onset  . Hypertension Mother   . Hypertension Father   . Heart attack Brother   . Stroke Mother   . Pulmonary embolism Father     Prior to Admission medications   Medication Sig Start Date End Date Taking? Authorizing Provider  acetaminophen (TYLENOL) 325 MG tablet Take 2 tablets (650 mg total) by mouth every 6 (six) hours as needed for moderate pain. 07/07/14   Ripudeep Jenna Luo, MD  Amino Acids-Protein Hydrolys (FEEDING SUPPLEMENT, PRO-STAT SUGAR FREE 64,) LIQD Take 30 mLs by mouth 2 (two) times daily at 10 AM and 5 PM. Patient taking differently: Take 30 mLs by mouth 3 (three) times daily with meals.  07/07/14   Ripudeep Jenna Luo, MD  aspirin 81 MG chewable tablet Chew 1 tablet (81 mg total) by mouth daily. 08/19/14   Mcarthur Rossetti Angiulli, PA-C  b complex-vitamin c-folic acid (NEPHRO-VITE) 0.8 MG TABS tablet Take 1 tablet by mouth daily.    Historical Provider, MD  darbepoetin (ARANESP) 60 MCG/0.3ML SOLN injection Inject 60 mcg into the skin every 7 (seven) days. Wednesdays with dialysis    Historical Provider, MD  docusate sodium 100 MG CAPS Take 100 mg by mouth 2 (two) times daily. 08/19/14   Mcarthur Rossetti Angiulli, PA-C  famotidine (PEPCID) 20 MG tablet Take 1 tablet (20 mg total) by mouth at bedtime. 08/19/14   Mcarthur Rossetti Angiulli, PA-C  glimepiride (AMARYL) 2 MG tablet TAKE 1 TABLET EVERY DAY WITH BREAKFAST 10/31/14   Ranelle Oyster, MD  guaiFENesin (MUCINEX) 600 MG 12 hr tablet Take 1 tablet (600 mg total) by mouth 2 (two) times daily. 12/17/13   Calvert Cantor, MD  metoCLOPramide (REGLAN) 10 MG tablet Take 1 tablet (10 mg total) by mouth 4 (four) times daily -  before meals and at bedtime. 08/19/14   Mcarthur Rossetti Angiulli, PA-C  metoprolol tartrate (LOPRESSOR) 12.5 mg TABS tablet 1 tab twice daily Sunday Tuesdays Thursdays Saturdays 08/19/14   Mcarthur Rossetti Angiulli, PA-C  midodrine (PROAMATINE) 5 MG tablet Take 1 tablet (5 mg  total) by mouth 2 (two) times daily with a meal. 08/19/14   Mcarthur Rossetti Angiulli, PA-C  multivitamin (RENA-VIT) TABS tablet Take 1 tablet by mouth at bedtime. 08/19/14   Mcarthur Rossetti Angiulli, PA-C  oxyCODONE (OXY IR/ROXICODONE) 5 MG immediate release tablet Take 1-2 tablets (5-10 mg total) by mouth every 4 (four) hours as needed for severe pain. 08/19/14   Mcarthur Rossetti Angiulli, PA-C  polyethylene glycol (MIRALAX / GLYCOLAX) packet Take 17 g by mouth daily. 08/19/14   Mcarthur Rossetti Angiulli, PA-C  prednisoLONE acetate (PRED FORTE) 1 % ophthalmic suspension Place 1 drop into both eyes 3 (three)  times daily. 12/17/13   Calvert Cantor, MD  sevelamer carbonate (RENVELA) 800 MG tablet Take 1 tablet (800 mg total) by mouth 3 (three) times daily with meals. 08/19/14   Charlton Amor, PA-C   Physical Exam: Filed Vitals:   12/07/14 1300 12/07/14 1315 12/07/14 1330 12/07/14 1345  BP: 113/54 118/52 115/63 114/53  Pulse: 83 85 90 93  Temp:      TempSrc:      Resp:   18 14  SpO2: 100% 98% 100% 99%    Wt Readings from Last 3 Encounters:  12/01/14 74.844 kg (165 lb)  08/19/14 75 kg (165 lb 5.5 oz)  07/29/14 80.468 kg (177 lb 6.4 oz)    General:  Chronically ill appearing Eyes: PERRL, normal lids, irises & conjunctiva ENT: grossly normal hearing, lips & tongue Neck: no LAD, masses or thyromegaly Cardiovascular: RRR, +murmur Telemetry: SR, no arrhythmias  Respiratory: CTA bilaterally, no w/r/r. Normal respiratory effort. Abdomen: soft, ntnd Musculoskeletal: left stump wrapped, right foot wrapped but redness and warmth extend to below knee, blister on upper thigh, quarter to 1/2 dollar size wound on outer right heel, could not feel pulses Psychiatric: grossly normal mood and affect, speech fluent and appropriate Neurologic: grossly non-focal.          Labs on Admission:  Basic Metabolic Panel:  Recent Labs Lab 12/07/14 1143  NA 134*  K 4.2  CL 94*  CO2 23  GLUCOSE 217*  BUN 50*  CREATININE 6.69*    CALCIUM 10.1   Liver Function Tests:  Recent Labs Lab 12/07/14 1143  AST 25  ALT 20  ALKPHOS 118*  BILITOT 1.3*  PROT 7.0  ALBUMIN 3.3*   No results for input(s): LIPASE, AMYLASE in the last 168 hours. No results for input(s): AMMONIA in the last 168 hours. CBC:  Recent Labs Lab 12/07/14 1143  WBC 16.6*  NEUTROABS 15.1*  HGB 11.3*  HCT 34.1*  MCV 96.1  PLT 225   Cardiac Enzymes: No results for input(s): CKTOTAL, CKMB, CKMBINDEX, TROPONINI in the last 168 hours.  BNP (last 3 results) No results for input(s): BNP in the last 8760 hours.  ProBNP (last 3 results) No results for input(s): PROBNP in the last 8760 hours.  CBG: No results for input(s): GLUCAP in the last 168 hours.  Radiological Exams on Admission: No results found.    Assessment/Plan Active Problems:   Diabetes mellitus with end stage renal disease   ESRD (end stage renal disease) on dialysis   PAD (peripheral artery disease)   CAD- severe 3V CAD at cath March 2015- medical Rx   CVA (cerebral infarction)   Cellulitis   Leukocytosis   Diabetic foot wound with cellulitis- IV Abx, MRI to r/o cellulitis, vascular consult -high risk for intervention but Dr. [name] recommended aortogram with b/l run off  ESRD- HD due today  Leukocytosis- IV abx, blood culture and follow  DM- SSI and trend, HgbA1C  3v CAD/AS- non surgical candidate  Nephrology- ER called Vascular- lawson  Code Status: full DVT Prophylaxis: heparin Family Communication: daughter at bedside Disposition Plan:   Time spent: 65 min  Marlin Canary [facility] Pager [phone]

## 2014-12-07 NOTE — Procedures (Signed)
Patient was seen on dialysis and the procedure was supervised.  BFR 350  Via AVF BP is  136/57.   Patient appears to be tolerating treatment well- just initiating  Briana Gould A 12/07/2014

## 2014-12-07 NOTE — ED Notes (Signed)
Lab results given to Dr.Zammit. 

## 2014-12-07 NOTE — Progress Notes (Signed)
ANTIBIOTIC CONSULT NOTE - INITIAL  Pharmacy Consult for Cefepime Indication: wound infection  Allergies  Allergen Reactions  . Phenergan [Promethazine Hcl] Other (See Comments)    Causes patient to" vomit more"  . Vitamin D Analogs Rash    She was able to take Hectorol 07/2014 admission and is now tolerating calcitriol 11/2014    Patient Measurements:    Vital Signs: Temp: 101.2 F (38.4 C) (03/30 1537) Temp Source: Oral (03/30 1537) BP: 105/43 mmHg (03/30 1537) Pulse Rate: 84 (03/30 1537) Intake/Output from previous day:   Intake/Output from this shift:    Labs:  Recent Labs  12/07/14 1143  WBC 16.6*  HGB 11.3*  PLT 225  CREATININE 6.69*   Estimated Creatinine Clearance: 8.2 mL/min (by C-G formula based on Cr of 6.69). No results for input(s): VANCOTROUGH, VANCOPEAK, VANCORANDOM, GENTTROUGH, GENTPEAK, GENTRANDOM, TOBRATROUGH, TOBRAPEAK, TOBRARND, AMIKACINPEAK, AMIKACINTROU, AMIKACIN in the last 72 hours.   Microbiology: No results found for this or any previous visit (from the past 720 hour(s)).  Medical History: Past Medical History  Diagnosis Date  . PAD (peripheral artery disease)     a. L BKA  . Type II diabetes mellitus   . Anemia   . History of blood transfusion 2002    "related to amputation" (08/13/2013)  . End stage renal disease on dialysis     "[facility]; M, W, F" (08/13/2013)  . DVT (deep venous thrombosis)     a. R IJ DVT 08/2013.  Marland Kitchen CVA (cerebral infarction)     a. MRI 08/2013: possible left motor strip infarct with remote pontine infarct.  . Pulmonary HTN     a. Cath 2011 - mod to severe pulmonary hypertension. b. Echo 08/2013: PAP (mild-mod increased).  . Thrombocytopenia   . CAD (coronary artery disease)     a. Severe diffuse diabetic disease by cath 2011, for med rx, not a candidate for CABG or PCI at that time.  . Aortic stenosis     mild  . Syncope     a. 2011, etiology never clear.  . Carotid artery disease      a. Dopp 16/1096: 1-39% LICA< 60-79% RICA borderline >80%.  . Cerebrovascular disease     a. 08/2013 MRA brain: multiple bilateral moderate and high grade intracranial stenoses  . Cardiac arrest 08/14/13    a. Possible arrest in setting of hypotension during dialysis with possible arrest requiring brief CPR, not intubated and was fully intact after episode. Suspected due to low cerebral perfusion in setting of hypotension. Unclear if any arrhythmia.  . End stage renal disease on dialysis   . Peripheral arterial occlusive disease   . Hypertension   . Diabetes mellitus type 2, uncontrolled, with complications   . DVT (deep venous thrombosis)      Right internal jugular vein  . CAD (coronary artery disease), native coronary artery 2011    Diffuse distal vessel CAD noted at cardiac catheterization  . MI (myocardial infarction)   . Stroke   . GIB (gastrointestinal bleeding)     Medications:  Prescriptions prior to admission  Medication Sig Dispense Refill Last Dose  . acetaminophen (TYLENOL) 500 MG tablet Take 500 mg by mouth every 6 (six) hours as needed for mild pain or moderate pain.   12/07/2014 at Unknown time  . aspirin 81 MG chewable tablet Chew 1 tablet (81 mg total) by mouth daily.   12/06/2014 at Unknown time  . atropine 1 % ophthalmic ointment Place 1 application  into the left eye daily at 12 noon.   12/07/2014 at Unknown time  . b complex-vitamin c-folic acid (NEPHRO-VITE) 0.8 MG TABS tablet Take 1 tablet by mouth daily.   12/06/2014 at Unknown time  . collagenase (SANTYL) ointment Apply 1 application topically every other day. Apply with dressing change to right foot   12/06/2014 at Unknown time  . glimepiride (AMARYL) 2 MG tablet TAKE 1 TABLET EVERY DAY WITH BREAKFAST 30 tablet 1 12/06/2014 at Unknown time  . metoCLOPramide (REGLAN) 10 MG tablet Take 1 tablet (10 mg total) by mouth 4 (four) times daily -  before meals and at bedtime. (Patient taking differently: Take 10 mg by mouth 3  (three) times daily before meals. ) 120 tablet 1 12/06/2014 at Unknown time  . metoprolol tartrate (LOPRESSOR) 12.5 mg TABS tablet 1 tab twice daily Sunday Tuesdays Thursdays Saturdays (Patient taking differently: 12.5 mg taking twice daily on non-dialysis days (Sunday Tuesdays Thursdays Saturdays)) 60 tablet 1 12/06/2014 at 2000  . midodrine (PROAMATINE) 5 MG tablet Take 1 tablet (5 mg total) by mouth 2 (two) times daily with a meal. 60 tablet 1 12/06/2014 at Unknown time  . multivitamin (RENA-VIT) TABS tablet Take 1 tablet by mouth at bedtime. 30 tablet 0 12/06/2014 at Unknown time  . Nutritional Supplements (FEEDING SUPPLEMENT, NEPRO CARB STEADY,) LIQD Take 237 mLs by mouth daily at 12 noon.   12/07/2014 at Unknown time  . OVER THE COUNTER MEDICATION Apply 1 application topically every other day. Silver Alginate cream with dressing change applied to left leg   12/06/2014 at Unknown time  . sevelamer carbonate (RENVELA) 800 MG tablet Take 1 tablet (800 mg total) by mouth 3 (three) times daily with meals. 90 tablet 1 12/06/2014 at Unknown time  . acetaminophen (TYLENOL) 325 MG tablet Take 2 tablets (650 mg total) by mouth every 6 (six) hours as needed for moderate pain. (Patient not taking: Reported on 12/07/2014)   Not Taking at Unknown time  . Amino Acids-Protein Hydrolys (FEEDING SUPPLEMENT, PRO-STAT SUGAR FREE 64,) LIQD Take 30 mLs by mouth 2 (two) times daily at 10 AM and 5 PM. (Patient not taking: Reported on 12/07/2014) 900 mL 0 Not Taking at Unknown time  . darbepoetin (ARANESP) 60 MCG/0.3ML SOLN injection Inject 60 mcg into the skin every 7 (seven) days. Wednesdays with dialysis   Not Taking at Unknown time  . docusate sodium 100 MG CAPS Take 100 mg by mouth 2 (two) times daily. (Patient not taking: Reported on 12/07/2014) 10 capsule 0 Not Taking at Unknown time  . famotidine (PEPCID) 20 MG tablet Take 1 tablet (20 mg total) by mouth at bedtime. (Patient not taking: Reported on 12/07/2014) 30 tablet 1 Not  Taking at Unknown time  . guaiFENesin (MUCINEX) 600 MG 12 hr tablet Take 1 tablet (600 mg total) by mouth 2 (two) times daily. (Patient not taking: Reported on 12/07/2014) 30 tablet 0 Not Taking at Unknown time  . oxyCODONE (OXY IR/ROXICODONE) 5 MG immediate release tablet Take 1-2 tablets (5-10 mg total) by mouth every 4 (four) hours as needed for severe pain. (Patient not taking: Reported on 12/07/2014) 90 tablet 0 Not Taking at Unknown time  . polyethylene glycol (MIRALAX / GLYCOLAX) packet Take 17 g by mouth daily. (Patient not taking: Reported on 12/07/2014) 14 each 0 Not Taking at Unknown time  . prednisoLONE acetate (PRED FORTE) 1 % ophthalmic suspension Place 1 drop into both eyes 3 (three) times daily. (Patient not taking: Reported on 12/07/2014)  5 mL 0 Not Taking at Unknown time   Assessment: 69 y.o. female presents with R leg redness and swelling. Pt is ESRD (o/p HD M/W/F). To begin Cefepime for wound infection. Tm 101.2. WBC elevated to 16.6.   Goal of Therapy:  Resolution of infection  Plan:  Cefepime 1gm IV q24h Will f/u micro data and pt's clinical condition  Christoper Fabianaron Ugochukwu Chichester, PharmD, BCPS Clinical pharmacist, pager [phone] 12/07/2014,3:51 PM

## 2014-12-07 NOTE — Consult Note (Signed)
[facility] Renal Consultation Note    Indication for Consultation:  Management of ESRD/hemodialysis; anemia, hypertension/volume and secondary hyperparathyroidism  HPI: Briana Gould is a very pleasant 69 y.o.AA female with multiple medical problems including ESRD secondary to DM/HTN on HD since 07/2010 who dialyzes MWF in [place].  PMHx is complicated by severe ASCVD,hx GIB, severe 3V CAD,  PVD, hx of several cardiac arrests with multiple hospital admissions over the years and most recently had a prolonged hospital/rehab stay 10/18 - 08/19/2014 due to critical illness myopathy following a respiratory arrest during the insertion of a perm cath.  She has a left AKA with stump wound, right heel ulcer and right foot ulcer all being treated at the wound center. She spilled hot tea on her right thigh Saturday with a large blister which has since gone down and has skin discoloration but no obvious infection. The wound center referred her to Dr. [name] 3/24 to evaluate for circulation for wound healing and arterography is planned. Her daughter also reported she was to have an MRI of the right foot to check for osteo due to the deep nature of the wounds. Her daughter reports the patient was a little weak after dialysis Monday and became more so on Tuesday.  Today she was unable to get out of bed due to extreme weakness and EMS was called to transport her to the ED for further evaluation and treatment. She denies fever or chills, SOB, CP, diarrhea or constipation. Her daughter reports increase redness and warmth to her right lower leg the past two days.  She did vomit yesterday. Her appetite has been good. Her BP is chronically low. She makes small amounts of urine. She has severe pain in her right leg. She did have prior + wound cultures in January 2016 for MSSA.  Leg has very impressive erythema- marked  Past Medical History  Diagnosis Date  . PAD (peripheral artery disease)     a. L BKA  . Type  II diabetes mellitus   . Anemia   . History of blood transfusion 2002    "related to amputation" (08/13/2013)  . End stage renal disease on dialysis     "[facility]; M, W, F" (08/13/2013)  . DVT (deep venous thrombosis)     a. R IJ DVT 08/2013.  Marland Kitchen CVA (cerebral infarction)     a. MRI 08/2013: possible left motor strip infarct with remote pontine infarct.  . Pulmonary HTN     a. Cath 2011 - mod to severe pulmonary hypertension. b. Echo 08/2013: PAP (mild-mod increased).  . Thrombocytopenia   . CAD (coronary artery disease)     a. Severe diffuse diabetic disease by cath 2011, for med rx, not a candidate for CABG or PCI at that time.  . Aortic stenosis     mild  . Syncope     a. 2011, etiology never clear.  . Carotid artery disease     a. Dopp 16/1096: 1-39% LICA< 60-79% RICA borderline >80%.  . Cerebrovascular disease     a. 08/2013 MRA brain: multiple bilateral moderate and high grade intracranial stenoses  . Cardiac arrest 08/14/13    a. Possible arrest in setting of hypotension during dialysis with possible arrest requiring brief CPR, not intubated and was fully intact after episode. Suspected due to low cerebral perfusion in setting of hypotension. Unclear if any arrhythmia.  . End stage renal disease on dialysis   . Peripheral arterial occlusive disease   .  Hypertension   . Diabetes mellitus type 2, uncontrolled, with complications   . DVT (deep venous thrombosis)      Right internal jugular vein  . CAD (coronary artery disease), native coronary artery 2011    Diffuse distal vessel CAD noted at cardiac catheterization  . MI (myocardial infarction)   . Stroke   . GIB (gastrointestinal bleeding)    Past Surgical History  Procedure Laterality Date  . Av fistula placement Left 2011    upper arm  . Below knee leg amputation Left 2002  . Tonsillectomy    . Cataract extraction w/ intraocular lens  implant, bilateral Bilateral 2012-2014  .  Esophagogastroduodenoscopy N/A 08/30/2013    Procedure: ESOPHAGOGASTRODUODENOSCOPY (EGD);  Surgeon: Theda BelfastPatrick D Hung, MD;  [facility];  Service: Endoscopy;  Laterality: N/A;  . Below knee leg amputation Left   . Av fistula placement Left   . Left and right heart catheterization with coronary angiogram Right 11/22/2013    Procedure: LEFT AND RIGHT HEART CATHETERIZATION WITH CORONARY ANGIOGRAM;  Surgeon: Peter M SwazilandJordan, MD;  [facility];  Service: Cardiovascular;  Laterality: Right;   Family History  Problem Relation Age of Onset  . Hypertension Mother   . Hypertension Father   . Heart attack Brother   . Stroke Mother   . Pulmonary embolism Father    Social History:  reports that she has never smoked. She does not have any smokeless tobacco history on file. She reports that she does not drink alcohol or use illicit drugs. Allergies  Allergen Reactions  . Phenergan [Promethazine Hcl] Other (See Comments)    Causes patient to" vomit more"  . Vitamin D Analogs Rash   Prior to Admission medications   Medication Sig Start Date End Date Taking? Authorizing Provider  acetaminophen (TYLENOL) 325 MG tablet Take 2 tablets (650 mg total) by mouth every 6 (six) hours as needed for moderate pain. 07/07/14   Ripudeep Jenna LuoK Rai, MD  Amino Acids-Protein Hydrolys (FEEDING SUPPLEMENT, PRO-STAT SUGAR FREE 64,) LIQD Take 30 mLs by mouth 2 (two) times daily at 10 AM and 5 PM. Patient taking differently: Take 30 mLs by mouth 3 (three) times daily with meals.  07/07/14   Ripudeep Jenna LuoK Rai, MD  aspirin 81 MG chewable tablet Chew 1 tablet (81 mg total) by mouth daily. 08/19/14   Mcarthur Rossettianiel J Angiulli, PA-C  b complex-vitamin c-folic acid (NEPHRO-VITE) 0.8 MG TABS tablet Take 1 tablet by mouth daily.    Historical Provider, MD  darbepoetin (ARANESP) 60 MCG/0.3ML SOLN injection Inject 60 mcg into the skin every 7 (seven) days. Wednesdays with dialysis    Historical Provider, MD  docusate sodium 100 MG CAPS  Take 100 mg by mouth 2 (two) times daily. 08/19/14   Mcarthur Rossettianiel J Angiulli, PA-C  famotidine (PEPCID) 20 MG tablet Take 1 tablet (20 mg total) by mouth at bedtime. 08/19/14   Mcarthur Rossettianiel J Angiulli, PA-C  glimepiride (AMARYL) 2 MG tablet TAKE 1 TABLET EVERY DAY WITH BREAKFAST 10/31/14   Ranelle OysterZachary T Swartz, MD  guaiFENesin (MUCINEX) 600 MG 12 hr tablet Take 1 tablet (600 mg total) by mouth 2 (two) times daily. 12/17/13   Calvert CantorSaima Rizwan, MD  metoCLOPramide (REGLAN) 10 MG tablet Take 1 tablet (10 mg total) by mouth 4 (four) times daily -  before meals and at bedtime. 08/19/14   Mcarthur Rossettianiel J Angiulli, PA-C  metoprolol tartrate (LOPRESSOR) 12.5 mg TABS tablet 1 tab twice daily Sunday Tuesdays Thursdays Saturdays 08/19/14   Mcarthur Rossettianiel J  Angiulli, PA-C  midodrine (PROAMATINE) 5 MG tablet Take 1 tablet (5 mg total) by mouth 2 (two) times daily with a meal. 08/19/14   Mcarthur Rossetti Angiulli, PA-C  multivitamin (RENA-VIT) TABS tablet Take 1 tablet by mouth at bedtime. 08/19/14   Mcarthur Rossetti Angiulli, PA-C  oxyCODONE (OXY IR/ROXICODONE) 5 MG immediate release tablet Take 1-2 tablets (5-10 mg total) by mouth every 4 (four) hours as needed for severe pain. 08/19/14   Mcarthur Rossetti Angiulli, PA-C  polyethylene glycol (MIRALAX / GLYCOLAX) packet Take 17 g by mouth daily. 08/19/14   Mcarthur Rossetti Angiulli, PA-C  prednisoLONE acetate (PRED FORTE) 1 % ophthalmic suspension Place 1 drop into both eyes 3 (three) times daily. 12/17/13   Calvert Cantor, MD  sevelamer carbonate (RENVELA) 800 MG tablet Take 1 tablet (800 mg total) by mouth 3 (three) times daily with meals. 08/19/14   Mcarthur Rossetti Angiulli, PA-C   No current facility-administered medications for this encounter.   Current Outpatient Prescriptions  Medication Sig Dispense Refill  . acetaminophen (TYLENOL) 325 MG tablet Take 2 tablets (650 mg total) by mouth every 6 (six) hours as needed for moderate pain.    . Amino Acids-Protein Hydrolys (FEEDING SUPPLEMENT, PRO-STAT SUGAR FREE 64,) LIQD Take 30 mLs by  mouth 2 (two) times daily at 10 AM and 5 PM. (Patient taking differently: Take 30 mLs by mouth 3 (three) times daily with meals. ) 900 mL 0  . aspirin 81 MG chewable tablet Chew 1 tablet (81 mg total) by mouth daily.    Marland Kitchen b complex-vitamin c-folic acid (NEPHRO-VITE) 0.8 MG TABS tablet Take 1 tablet by mouth daily.    . darbepoetin (ARANESP) 60 MCG/0.3ML SOLN injection Inject 60 mcg into the skin every 7 (seven) days. Wednesdays with dialysis    . docusate sodium 100 MG CAPS Take 100 mg by mouth 2 (two) times daily. 10 capsule 0  . famotidine (PEPCID) 20 MG tablet Take 1 tablet (20 mg total) by mouth at bedtime. 30 tablet 1  . glimepiride (AMARYL) 2 MG tablet TAKE 1 TABLET EVERY DAY WITH BREAKFAST 30 tablet 1  . guaiFENesin (MUCINEX) 600 MG 12 hr tablet Take 1 tablet (600 mg total) by mouth 2 (two) times daily. 30 tablet 0  . metoCLOPramide (REGLAN) 10 MG tablet Take 1 tablet (10 mg total) by mouth 4 (four) times daily -  before meals and at bedtime. 120 tablet 1  . metoprolol tartrate (LOPRESSOR) 12.5 mg TABS tablet 1 tab twice daily Sunday Tuesdays Thursdays Saturdays 60 tablet 1  . midodrine (PROAMATINE) 5 MG tablet Take 1 tablet (5 mg total) by mouth 2 (two) times daily with a meal. 60 tablet 1  . multivitamin (RENA-VIT) TABS tablet Take 1 tablet by mouth at bedtime. 30 tablet 0  . oxyCODONE (OXY IR/ROXICODONE) 5 MG immediate release tablet Take 1-2 tablets (5-10 mg total) by mouth every 4 (four) hours as needed for severe pain. 90 tablet 0  . polyethylene glycol (MIRALAX / GLYCOLAX) packet Take 17 g by mouth daily. 14 each 0  . prednisoLONE acetate (PRED FORTE) 1 % ophthalmic suspension Place 1 drop into both eyes 3 (three) times daily. 5 mL 0  . sevelamer carbonate (RENVELA) 800 MG tablet Take 1 tablet (800 mg total) by mouth 3 (three) times daily with meals. 90 tablet 1   Labs: Basic Metabolic Panel:  Recent Labs Lab 12/07/14 1143  NA 134*  K 4.2  CL 94*  CO2 23  GLUCOSE 217*  BUN  50*  CREATININE 6.69*  CALCIUM 10.1   Liver Function Tests:  Recent Labs Lab 12/07/14 1143  AST 25  ALT 20  ALKPHOS 118*  BILITOT 1.3*  PROT 7.0  ALBUMIN 3.3*  CBC:  Recent Labs Lab 12/07/14 1143  WBC 16.6*  NEUTROABS 15.1*  HGB 11.3*  HCT 34.1*  MCV 96.1  PLT 225   ROS: As per HPI otherwise negative. Physical Exam: Filed Vitals:   12/07/14 1315 12/07/14 1330 12/07/14 1345 12/07/14 1415  BP: 118/52 115/63 114/53 95/74  Pulse: 85 90 93 84  Temp:      TempSrc:      Resp:  SpO2: 98% 100% 99% 99%     General:  Ill appear AA female breathing easily, dozes off in coversation Head: Normocephalic, atraumatic, sclera non-icteric, mucus membranes are moist Neck: Supple. JVD not elevated. Lungs:  Clear without rales Heart: RRR   Abdomen: Soft, NTND +BS Lower extremities: left AKA with dressing wrapped - not removed- healing wound with some white areas, no drainage- no sign of active infetion; right foot/heel wounds wrapped and not removed; right lateral LE ^^ erythema/ mild edema - difficult to move due to pain and weakness; healing burn on right lateral thigh Neuro: Alert and oriented X 3. Moves with difficulty due to weakness Psych:  Responds to questions appropriately with a normal affect. Dialysis Access: left upper AVF + bruit  Dialysis Orders: Center: [place] MWF 4 hr 180 EDW 72.5 350/A 1.5 2K 2 Ca profile 4 left upper AVF heparin 5000 Mircera 75 q 4 weeks last given 3/23 venofer 100 q HD has had 2 of 5 doses, calcitriol 1.25 q HD Recent labs:  Hgb 10.6 3/23 - usual range, iPTH 658 3/18 down from 1273 1/21 and 2348 05/2014  Assessment/Plan: 1.  Fever/weakness likely related to right lower extremity wounds with celllulitis- for MRI, VVS to see and start Vanc/Maxipime and flagyl - hx of prior MSSA wound culture in Jan 2016. BC pending  2.  ESRD -  MWF - HD via AVF actually has been going reasonably well all things considered, will plan for HD today to keep on  schedule 3.  Hypertension/volume  - chronic hypotension on midodrine - sats ok on room - has been getting +/- EDW of 72.5 with average UF of 2-3 L 4.  Anemia  -has been on Mircera 75 q 4 weeks last given 3/23, due 3 more doses of venofer; follow Hgb - no need to redose ESA yet 5.  Metabolic bone disease -  allergies list vit D analogue but can take calcitriol (and previously took Hectorol last admission) /renvela 6.  Nutrition - renal diet + vitamin 7.  severe ASCVD - with 3 vessel CAD, PVD, hx CVA - full code  8. DM - per primary  Sheffield Slider, PA-C [facility] Beeper [phone] 12/07/2014, 2:50 PM   Patient seen and examined, agree with above note with above modifications. Pleasant 69 year old hispanic female with multiple medical issues and multiple hospitalizations with poorly healing wounds- she now presents with remarkable cellulitis of her RLE- place on vanc/meropenam- will plan to do dialysis today on schedule  [name], MD 12/07/2014

## 2014-12-07 NOTE — Progress Notes (Signed)
Briana Gould is a 69 y.o. female patient admitted from ED awake, alert - oriented  X 3 - no acute distress noted.  VSS - Blood pressure 105/43, pulse 84, temperature 101.2 F (38.4 C), temperature source Oral, resp. rate 18, height 5\' 5"  (1.651 m), SpO2 100 %.    IV in place, occlusive dsg intact without redness. PRN tylenol given for fever.  Report given to HDU RN. Patient admitted to room and transported to HDU at 1603.  Orientation to room, and floor completed with information packet given to patient/family.  Patient declined safety video at this time.  Admission INP armband ID verified with patient/family, and in place.   SR up x 2, fall assessment complete, with patient and family able to verbalize understanding of risk associated with falls, and verbalized understanding to call nsg before up out of bed.  Call light within reach, patient able to voice, and demonstrate understanding.    Skin: pressure sore to left BKA (bottom of leg), pt states she wears a prosthetic at home. Sore noted to right shin, pressure sore unstagable right heel, sore to outer side to right foot (stage 4?), and a burn with blister to upper right leg.      Will cont to eval and treat per MD orders.  [name], [name], RN 12/07/2014 4:22 PM

## 2014-12-07 NOTE — Consult Note (Signed)
[facility] Consult  Reason for Consult:  Bilateral leg wounds Referring Physician:  EDP MRN #:  [id number]  History of Present Illness: This is a 69 y.o. female who we've been consulted on regarding bilateral leg wounds and cellulitis. She is accompanied by her daughter.  She reports acute onset of redness and pain to her right lower leg since last night. She was also vomiting last night. She was recently seen in the office on 12/03/14 by Dr. [name] who declared her to have bilateral lower extremity critical limb ischemia. She reports a wound to her left below knee stump and two wounds on her right foot that started about six weeks ago. She has been going to the wound center for treatment. She reports some improvement in her wounds but was referred to Dr. [name] for evaluate circulation for wound healing. She has a PMH of three vessel CAD, AS, ESRD on HD, and DM II. She has also had three cardiac arrests during previous hospital admissions. Given her significant co-morbidities, she was deemed not to be a surgical candidate. Plans were made for arteriogram with bilateral runoff and intervention.  According to her daughter, the patient was to have an MRI to evaluate for osteomyelitis of her right foot.   Past Medical History  Diagnosis Date  . PAD (peripheral artery disease)     a. L BKA  . Type II diabetes mellitus   . Anemia   . History of blood transfusion 2002    "related to amputation" (08/13/2013)  . End stage renal disease on dialysis     "[facility]; M, W, F" (08/13/2013)  . DVT (deep venous thrombosis)     a. R IJ DVT 08/2013.  Marland Kitchen CVA (cerebral infarction)     a. MRI 08/2013: possible left motor strip infarct with remote pontine infarct.  . Pulmonary HTN     a. Cath 2011 - mod to severe pulmonary hypertension. b. Echo 08/2013: PAP (mild-mod increased).  . Thrombocytopenia   . CAD (coronary artery disease)     a. Severe diffuse diabetic  disease by cath 2011, for med rx, not a candidate for CABG or PCI at that time.  . Aortic stenosis     mild  . Syncope     a. 2011, etiology never clear.  . Carotid artery disease     a. Dopp 40/9811: 1-39% LICA< 60-79% RICA borderline >80%.  . Cerebrovascular disease     a. 08/2013 MRA brain: multiple bilateral moderate and high grade intracranial stenoses  . Cardiac arrest 08/14/13    a. Possible arrest in setting of hypotension during dialysis with possible arrest requiring brief CPR, not intubated and was fully intact after episode. Suspected due to low cerebral perfusion in setting of hypotension. Unclear if any arrhythmia.  . End stage renal disease on dialysis   . Peripheral arterial occlusive disease   . Hypertension   . Diabetes mellitus type 2, uncontrolled, with complications   . DVT (deep venous thrombosis)      Right internal jugular vein  . CAD (coronary artery disease), native coronary artery 2011    Diffuse distal vessel CAD noted at cardiac catheterization  . MI (myocardial infarction)   . Stroke   . GIB (gastrointestinal bleeding)    Past Surgical History  Procedure Laterality Date  . Av fistula placement Left 2011    upper arm  . Below knee leg amputation Left 2002  . Tonsillectomy    .  Cataract extraction w/ intraocular lens  implant, bilateral Bilateral 2012-2014  . Esophagogastroduodenoscopy N/A 08/30/2013    Procedure: ESOPHAGOGASTRODUODENOSCOPY (EGD);  Surgeon: Theda Belfast, MD;  Location: [facility] ENDOSCOPY;  Service: Endoscopy;  Laterality: N/A;  . Below knee leg amputation Left   . Av fistula placement Left   . Left and right heart catheterization with coronary angiogram Right 11/22/2013    Procedure: LEFT AND RIGHT HEART CATHETERIZATION WITH CORONARY ANGIOGRAM;  Surgeon: Peter M Swaziland, MD;  Location: [facility] CATH LAB;  Service: Cardiovascular;  Laterality: Right;    Allergies  Allergen Reactions  . Phenergan [Promethazine Hcl] Other (See Comments)    Causes  patient to" vomit more"  . Vitamin D Analogs Rash    She was able to take Hectorol 07/2014 admission and is now tolerating calcitriol 11/2014    Prior to Admission medications   Medication Sig Start Date End Date Taking? Authorizing Provider  acetaminophen (TYLENOL) 500 MG tablet Take 500 mg by mouth every 6 (six) hours as needed for mild pain or moderate pain.   Yes Historical Provider, MD  aspirin 81 MG chewable tablet Chew 1 tablet (81 mg total) by mouth daily. 08/19/14  Yes Daniel J Angiulli, PA-C  atropine 1 % ophthalmic ointment Place 1 application into the left eye daily at 12 noon.   Yes Historical Provider, MD  b complex-vitamin c-folic acid (NEPHRO-VITE) 0.8 MG TABS tablet Take 1 tablet by mouth daily.   Yes Historical Provider, MD  collagenase (SANTYL) ointment Apply 1 application topically every other day. Apply with dressing change to right foot   Yes Historical Provider, MD  glimepiride (AMARYL) 2 MG tablet TAKE 1 TABLET EVERY DAY WITH BREAKFAST 10/31/14  Yes Ranelle Oyster, MD  metoCLOPramide (REGLAN) 10 MG tablet Take 1 tablet (10 mg total) by mouth 4 (four) times daily -  before meals and at bedtime. Patient taking differently: Take 10 mg by mouth 3 (three) times daily before meals.  08/19/14  Yes Daniel J Angiulli, PA-C  metoprolol tartrate (LOPRESSOR) 12.5 mg TABS tablet 1 tab twice daily Sunday Tuesdays Thursdays Saturdays Patient taking differently: 12.5 mg taking twice daily on non-dialysis days (Sunday Tuesdays Thursdays Saturdays) 08/19/14  Yes Mcarthur Rossetti Angiulli, PA-C  midodrine (PROAMATINE) 5 MG tablet Take 1 tablet (5 mg total) by mouth 2 (two) times daily with a meal. 08/19/14  Yes Daniel J Angiulli, PA-C  multivitamin (RENA-VIT) TABS tablet Take 1 tablet by mouth at bedtime. 08/19/14  Yes Daniel J Angiulli, PA-C  Nutritional Supplements (FEEDING SUPPLEMENT, NEPRO CARB STEADY,) LIQD Take 237 mLs by mouth daily at 12 noon.   Yes Historical Provider, MD  OVER THE COUNTER  MEDICATION Apply 1 application topically every other day. Silver Alginate cream with dressing change applied to left leg   Yes Historical Provider, MD  sevelamer carbonate (RENVELA) 800 MG tablet Take 1 tablet (800 mg total) by mouth 3 (three) times daily with meals. 08/19/14  Yes Daniel J Angiulli, PA-C  acetaminophen (TYLENOL) 325 MG tablet Take 2 tablets (650 mg total) by mouth every 6 (six) hours as needed for moderate pain. Patient not taking: Reported on 12/07/2014 07/07/14   Ripudeep Jenna Luo, MD  Amino Acids-Protein Hydrolys (FEEDING SUPPLEMENT, PRO-STAT SUGAR FREE 64,) LIQD Take 30 mLs by mouth 2 (two) times daily at 10 AM and 5 PM. Patient not taking: Reported on 12/07/2014 07/07/14   Ripudeep Jenna Luo, MD  darbepoetin (ARANESP) 60 MCG/0.3ML SOLN injection Inject 60 mcg into the skin every  7 (seven) days. Wednesdays with dialysis    Historical Provider, MD  docusate sodium 100 MG CAPS Take 100 mg by mouth 2 (two) times daily. Patient not taking: Reported on 12/07/2014 08/19/14   Mcarthur Rossetti Angiulli, PA-C  famotidine (PEPCID) 20 MG tablet Take 1 tablet (20 mg total) by mouth at bedtime. Patient not taking: Reported on 12/07/2014 08/19/14   Mcarthur Rossetti Angiulli, PA-C  guaiFENesin (MUCINEX) 600 MG 12 hr tablet Take 1 tablet (600 mg total) by mouth 2 (two) times daily. Patient not taking: Reported on 12/07/2014 12/17/13   Calvert Cantor, MD  oxyCODONE (OXY IR/ROXICODONE) 5 MG immediate release tablet Take 1-2 tablets (5-10 mg total) by mouth every 4 (four) hours as needed for severe pain. Patient not taking: Reported on 12/07/2014 08/19/14   Mcarthur Rossetti Angiulli, PA-C  polyethylene glycol (MIRALAX / GLYCOLAX) packet Take 17 g by mouth daily. Patient not taking: Reported on 12/07/2014 08/19/14   Mcarthur Rossetti Angiulli, PA-C  prednisoLONE acetate (PRED FORTE) 1 % ophthalmic suspension Place 1 drop into both eyes 3 (three) times daily. Patient not taking: Reported on 12/07/2014 12/17/13   Calvert Cantor, MD    History    Social History  . Marital Status: Married    Spouse Name: N/A  . Number of Children: N/A  . Years of Education: N/A   Occupational History  . Not on file.   Social History Main Topics  . Smoking status: Never Smoker   . Smokeless tobacco: Not on file  . Alcohol Use: No  . Drug Use: No  . Sexual Activity: Not Currently   Other Topics Concern  . Not on file   Social History Narrative   ** Merged History Encounter **       Nonsmoker, no alcohol. Supportive family.    Family History  Problem Relation Age of Onset  . Hypertension Mother   . Hypertension Father   . Heart attack Brother   . Stroke Mother   . Pulmonary embolism Father     ROS:  Positive    Negative    All sytems reviewed and are negative  Cardiovascular:  chest pain/pressure  palpitations  SOB lying flat  DOE  pain in legs while walking  pain in legs at rest  pain in legs at night  non-healing ulcers  hx of DVT  swelling in legs  Pulmonary:  productive cough  asthma/wheezing  home O2  Neurologic:  weakness in  arms  legs  numbness in  arms  legs  hx of CVA  mini stroke difficulty speaking or slurred speech  temporary loss of vision in one eye  dizziness  Hematologic:  hx of cancer  bleeding problems  problems with blood clotting easily  Endocrine:    diabetes  thyroid disease  GI  vomiting blood  blood in stool  GU:  CKD/renal failure  HD--[x]  M/W/F or  T/T/S  burning with urination  blood in urine  Psychiatric:  anxiety  depression  Musculoskeletal:  arthritis  joint pain  Integumentary:  rashes  ulcers  Constitutional:  fever  chills   Physical Examination  Filed Vitals:   12/07/14 1537  BP: 105/43  Pulse: 84  Temp: 101.2 F (38.4 C)  Resp: 18   There is no weight on file to calculate BMI.  General:  WD tired appearing female in NAD HENT: WNL,  normocephalic Pulmonary: normal non-labored breathing, without rales, rhonchi,  wheezing Cardiac: regular, without  Murmurs, rubs or gallops;  without carotid bruits Abdomen: soft, NT/ND, no masses Vascular Exam/Pulses:  Right Left  Radial 2+ (normal) 2+ (normal)  Ulnar Not palpable Not palpable  Femoral 2+ (normal) 2+ (normal)  Popliteal Not palpable Not palpable  DP Not palpable BKA  PT Not palpable BKA   Extremities: cellulitis of right lower extremity from below knee down to foot with blisters on upper thigh. Necrotic right heel wound. Full thickness wound with exposed bone to right lateral foot. Left below knee stump with ulceration at distal stump. Musculoskeletal: no muscle wasting or atrophy  Neurologic: A&O X 3; Appropriate Affect Speech is fluent/normal  CBC    Component Value Date/Time   WBC 16.6* 12/07/2014 1143   RBC 3.55* 12/07/2014 1143   RBC 3.30* 09/02/2010 0650   HGB 11.3* 12/07/2014 1143   HCT 34.1* 12/07/2014 1143   PLT 225 12/07/2014 1143   MCV 96.1 12/07/2014 1143   MCH 31.8 12/07/2014 1143   MCHC 33.1 12/07/2014 1143   RDW 17.7* 12/07/2014 1143   LYMPHSABS 0.7 12/07/2014 1143   MONOABS 0.8 12/07/2014 1143   EOSABS 0.0 12/07/2014 1143   BASOSABS 0.0 12/07/2014 1143    BMET    Component Value Date/Time   NA 134* 12/07/2014 1143   K 4.2 12/07/2014 1143   CL 94* 12/07/2014 1143   CO2 23 12/07/2014 1143   GLUCOSE 217* 12/07/2014 1143   BUN 50* 12/07/2014 1143   CREATININE 6.69* 12/07/2014 1143   CALCIUM 10.1 12/07/2014 1143   GFRNONAA 6* 12/07/2014 1143   GFRAA 7* 12/07/2014 1143    COAGS: Lab Results  Component Value Date   INR 1.18 06/27/2014   INR 1.31 06/27/2014   INR 1.06 11/22/2013     ASSESSMENT/PLAN: This is a 69 y.o. female with bilateral lower extremity critical limb ischemia manifested by right necrotic heel wound and right lateral full thickness foot wound with exposed bone, cellulitis of right lower leg and ulceration of her  left below knee stump. She was last seen by Dr. Imogene Burnhen last week on 12/01/14 who recommended arteriogram with bilateral runoff and intervention. Her ABIs at that time revealed non-compressible vessels and monophasic waveforms. She has a history of three vessel CAD and aortic stenosis. She reportedly has had three cardiac arrests during previous hospital admissions.  Given her significant co-morbidities, she is not considered a surgical candidate for any bypass procedures. The patient and daughter are aware that she may not benefit from endovascular intervention and require amputation and revision of her left below knee amputation. The daughter is concerned about anesthesia and cardiac arrest and are against amputation. Even with light sedation, she has coded. Tentatively plan for arteriogram sometime in the future.  Admitted by internal medicine and started on IV antibiotics. Dr. Hart RochesterLawson to evaluate patient.     Maris BergerKimberly Trinh, PA-C [facility] Office: [phone] Pager: [phone]  Agree with above assessment Patient has multiple comorbidities and is not a candidate for open surgical bypass She has extensive ulcer right heel measuring about 2 cm in diameter and necrotic ulcer lateral right foot over fifth metatarsal bone. She has very dark chronically ischemic scan lower third of leg with cellulitis up to just below knee. There are some pustules in the skin below the knee. Blistered area and right thigh is where patient spilled hot coffee on her thigh last week. She has ABI of 0.2 with multilevel occlusive disease. Doubt very seriously that she will heal these ulcers and resolve this infection even with attempt  at revascularization percutaneously I discussed this with patient and her daughter. I think patient may be best treated with right above-knee amputation but daughter says patient is not ready to accept this  Would recommend treating her aggressively with IV antibiotics over  the next 3-5 days to see if any improvement. If she has progression of cellulitis proximally she will need proximal leg amputation. If cellulitis resolves Dr. [name] will make decision regarding whether he would like to proceed with angiography early next week. Discussed this plan with renal and they are in agreement.

## 2014-12-07 NOTE — Progress Notes (Signed)
Pt received HD with no complications. .8L fluid removed. Report called to primary RN. Pt alert, vss, returned safely to room.

## 2014-12-07 NOTE — Progress Notes (Signed)
Report received from Christina,RN for admission to [id number]

## 2014-12-08 ENCOUNTER — Encounter (HOSPITAL_COMMUNITY): Payer: Self-pay | Admitting: General Practice

## 2014-12-08 ENCOUNTER — Inpatient Hospital Stay (HOSPITAL_COMMUNITY): Payer: Medicare Other

## 2014-12-08 ENCOUNTER — Ambulatory Visit (HOSPITAL_COMMUNITY): Admission: RE | Admit: 2014-12-08 | Payer: Medicare Other | Source: Ambulatory Visit

## 2014-12-08 DIAGNOSIS — Z01810 Encounter for preprocedural cardiovascular examination: Secondary | ICD-10-CM

## 2014-12-08 DIAGNOSIS — I2510 Atherosclerotic heart disease of native coronary artery without angina pectoris: Secondary | ICD-10-CM

## 2014-12-08 DIAGNOSIS — M869 Osteomyelitis, unspecified: Secondary | ICD-10-CM

## 2014-12-08 LAB — BASIC METABOLIC PANEL
Anion gap: 12 (ref 5–15)
BUN: 26 mg/dL — ABNORMAL HIGH (ref 6–23)
CO2: 26 mmol/L (ref 19–32)
Calcium: 9.2 mg/dL (ref 8.4–10.5)
Chloride: 96 mmol/L (ref 96–112)
Creatinine, Ser: 4.05 mg/dL — ABNORMAL HIGH (ref 0.50–1.10)
GFR calc non Af Amer: 10 mL/min — ABNORMAL LOW (ref >90)
GFR, EST AFRICAN AMERICAN: 12 mL/min — AB (ref >90)
Glucose, Bld: 207 mg/dL — ABNORMAL HIGH (ref 70–99)
Potassium: 3.8 mmol/L (ref 3.5–5.1)
Sodium: 134 mmol/L — ABNORMAL LOW (ref 135–145)

## 2014-12-08 LAB — GLUCOSE, CAPILLARY
GLUCOSE-CAPILLARY: 133 mg/dL — AB (ref 70–99)
Glucose-Capillary: 171 mg/dL — ABNORMAL HIGH (ref 70–99)
Glucose-Capillary: 175 mg/dL — ABNORMAL HIGH (ref 70–99)
Glucose-Capillary: 164 mg/dL — ABNORMAL HIGH (ref 70–99)

## 2014-12-08 LAB — CBC
HCT: 30.9 % — ABNORMAL LOW (ref 36.0–46.0)
HEMOGLOBIN: 10.3 g/dL — AB (ref 12.0–15.0)
MCH: 31.9 pg (ref 26.0–34.0)
MCHC: 33.3 g/dL (ref 30.0–36.0)
MCV: 95.7 fL (ref 78.0–100.0)
Platelets: 202 10*3/uL (ref 150–400)
RBC: 3.23 MIL/uL — AB (ref 3.87–5.11)
RDW: 17.7 % — ABNORMAL HIGH (ref 11.5–15.5)
WBC: 13.4 10*3/uL — AB (ref 4.0–10.5)

## 2014-12-08 LAB — C-REACTIVE PROTEIN: CRP: 29.9 mg/dL — ABNORMAL HIGH (ref <0.60)

## 2014-12-08 LAB — SEDIMENTATION RATE: Sed Rate: 103 mm/hr — ABNORMAL HIGH (ref 0–22)

## 2014-12-08 MED ORDER — SODIUM CHLORIDE 0.9 % IV SOLN: 100.0000 mL | INTRAVENOUS | Status: DC | PRN

## 2014-12-08 MED ORDER — METOPROLOL TARTRATE 12.5 MG HALF TABLET
12.5000 mg | ORAL_TABLET | ORAL | Status: DC
Start: 2014-12-09 — End: 2014-12-19
  Administered 2014-12-09 – 2014-12-12 (×2): 12.5 mg via ORAL
  Filled 2014-12-08 (×5): qty 1

## 2014-12-08 MED ORDER — HEPARIN SODIUM (PORCINE) 1000 UNIT/ML DIALYSIS
20.0000 [IU]/kg | INTRAMUSCULAR | Status: DC | PRN
  Filled 2014-12-08: qty 2

## 2014-12-08 MED ORDER — NEPRO/CARBSTEADY PO LIQD: 237.0000 mL | ORAL | Status: DC | PRN

## 2014-12-08 MED ORDER — CEFEPIME HCL 1 G IJ SOLR
1.0000 g | INTRAMUSCULAR | Status: AC
  Administered 2014-12-08: 1 g via INTRAVENOUS
  Filled 2014-12-08 (×2): qty 1

## 2014-12-08 MED ORDER — PENTAFLUOROPROP-TETRAFLUOROETH EX AERO: 1.0000 "application " | INHALATION_SPRAY | CUTANEOUS | Status: DC | PRN

## 2014-12-08 MED ORDER — LIDOCAINE-PRILOCAINE 2.5-2.5 % EX CREA: 1.0000 "application " | TOPICAL_CREAM | CUTANEOUS | Status: DC | PRN

## 2014-12-08 MED ORDER — ALTEPLASE 2 MG IJ SOLR
2.0000 mg | Freq: Once | INTRAMUSCULAR | Status: AC | PRN
  Filled 2014-12-08: qty 2

## 2014-12-08 MED ORDER — COLLAGENASE 250 UNIT/GM EX OINT
1.0000 "application " | TOPICAL_OINTMENT | CUTANEOUS | Status: DC
  Administered 2014-12-08 – 2014-12-20 (×8): 1 via TOPICAL
  Filled 2014-12-08 (×3): qty 30

## 2014-12-08 MED ORDER — NEPRO/CARBSTEADY PO LIQD: 237.0000 mL | Freq: Three times a day (TID) | ORAL | Status: DC | PRN

## 2014-12-08 MED ORDER — DEXTROSE 5 % IV SOLN
2.0000 g | INTRAVENOUS | Status: DC
  Administered 2014-12-09 – 2014-12-16 (×4): 2 g via INTRAVENOUS
  Filled 2014-12-08 (×11): qty 2

## 2014-12-08 MED ORDER — MIDODRINE HCL 5 MG PO TABS
10.0000 mg | ORAL_TABLET | ORAL | Status: DC
  Administered 2014-12-09 – 2014-12-14 (×3): 10 mg via ORAL
  Filled 2014-12-08 (×5): qty 2

## 2014-12-08 MED ORDER — HEPARIN SODIUM (PORCINE) 1000 UNIT/ML DIALYSIS
1000.0000 [IU] | INTRAMUSCULAR | Status: DC | PRN
  Filled 2014-12-08: qty 1

## 2014-12-08 MED ORDER — LIDOCAINE HCL (PF) 1 % IJ SOLN: 5.0000 mL | INTRAMUSCULAR | Status: DC | PRN

## 2014-12-08 MED ORDER — ATROPINE SULFATE 1 % OP OINT
1.0000 "application " | TOPICAL_OINTMENT | Freq: Every day | OPHTHALMIC | Status: DC
  Administered 2014-12-08 – 2014-12-21 (×13): 1 via OPHTHALMIC
  Filled 2014-12-08 (×3): qty 3.5

## 2014-12-08 MED ORDER — ALTEPLASE 2 MG IJ SOLR: 2.0000 mg | Freq: Once | INTRAMUSCULAR | Status: DC | PRN

## 2014-12-08 MED ORDER — VANCOMYCIN HCL 10 G IV SOLR
1250.0000 mg | INTRAVENOUS | Status: AC
  Administered 2014-12-08: 1250 mg via INTRAVENOUS
  Filled 2014-12-08 (×2): qty 1250

## 2014-12-08 MED ORDER — COLLAGENASE 250 UNIT/GM EX OINT: TOPICAL_OINTMENT | CUTANEOUS | Status: DC

## 2014-12-08 MED ORDER — PRO-STAT SUGAR FREE PO LIQD
30.0000 mL | Freq: Three times a day (TID) | ORAL | Status: DC
  Administered 2014-12-08 – 2014-12-21 (×29): 30 mL via ORAL
  Filled 2014-12-08 (×42): qty 30

## 2014-12-08 MED ORDER — BACITRACIN-NEOMYCIN-POLYMYXIN OINTMENT TUBE
1.0000 | TOPICAL_OINTMENT | Freq: Every day | CUTANEOUS | Status: DC
Start: 2014-12-08 — End: 2014-12-21
  Administered 2014-12-08 – 2014-12-21 (×13): 1 via TOPICAL
  Filled 2014-12-08 (×2): qty 15

## 2014-12-08 MED ORDER — HEPARIN SODIUM (PORCINE) 1000 UNIT/ML DIALYSIS: 1000.0000 [IU] | INTRAMUSCULAR | Status: DC | PRN

## 2014-12-08 MED ORDER — VANCOMYCIN HCL IN DEXTROSE 750-5 MG/150ML-% IV SOLN
750.0000 mg | INTRAVENOUS | Status: DC
  Administered 2014-12-09 – 2014-12-16 (×4): 750 mg via INTRAVENOUS
  Filled 2014-12-08 (×8): qty 150

## 2014-12-08 NOTE — Progress Notes (Signed)
[name], female   DOB: 02-Sep-1946, 69 y.o.   MRN: [id number] Vascular Surgery Progress Note  Subjective: To nonhealing ulcers right foot and heel with cellulitis to proximal calf. Patient denies any chills and fever during the night but. MAXIMUM TEMPERATURE past 24 hours with 101.2 but 99.8 this a.m. Have decided to observe her on IV antibiotics through the weekend and Dr. Imogene Burnhen will make decision regarding angiography.  Objective:  Filed Vitals:   12/08/14 0522  BP: 132/59  Pulse: 81  Temp: 99.8 F (37.7 C)  Resp: 18    Gen. alert and oriented 3 Right lower extremity examined in the cellulitis has not progressed proximally and extends up his below the knee circumferentially. There are a few pustules on the anterior compartment. Necrotic ulcers in the right foot as previously described one on the heel on the lateral foot with severe ischemic changes in the skin.   Labs:  Recent Labs Lab 12/07/14 1143 12/08/14 0630  CREATININE 6.69* 4.05*    Recent Labs Lab 12/07/14 1143 12/08/14 0630  NA 134* 134*  K 4.2 3.8  CL 94* 96  CO2 23 26  BUN 50* 26*  CREATININE 6.69* 4.05*  GLUCOSE 217* 207*  CALCIUM 10.1 9.2    Recent Labs Lab 12/07/14 1143 12/08/14 0630  WBC 16.6* 13.4*  HGB 11.3* 10.3*  HCT 34.1* 30.9*  PLT 225 202   No results for input(s): INR in the last 168 hours.  I/O last 3 completed shifts: In: 317 [P.O.:317] Out: 832 [Other:832]  Imaging: No results found.  Assessment/Plan:   LOS: 1 day  s/p   We'll continue to follow patient who has severe superficial femoral popliteal and tibial occlusive disease with ABI of 0.2 and cellulitis up to just below the knee. Will see how she does on IV antibiotics. Suspect patient will eventually require right above-knee amputation which she is resistant to.   Josephina GipJames Lecil Tapp, MD 12/08/2014 9:05 AM

## 2014-12-08 NOTE — Progress Notes (Signed)
Patient expressed concern and was upset that lots of tape was unable to be used around the wound bandage for securement, CN Joni Reiningicole witness. I conveyed to patient that exact order written for dressing was followed per wound care nurse (moist fluffed gauze, then dry 4X4, then foam heel cushion and kerlex using swab to fill gauze into right ankle wound), and that extra tape attached to the bare skin around the wound site would risk further impaired skin integrity on an already compromised wound.  The wound dressing is fully adhered to patient ankle and includes assistive foam device left by wound care, CN Joni Reiningicole witness.

## 2014-12-08 NOTE — Progress Notes (Addendum)
[facility] PROGRESS NOTE  Briana Gould WUJ:[id number] DOB: 08/14/1946 DOA: 12/07/2014 PCP: [name], MD  Assessment/Plan: Active Problems:   Diabetes mellitus with end stage renal disease   ESRD (end stage renal disease) on dialysis   PAD (peripheral artery disease)   CAD- severe 3V CAD at cath March 2015- medical Rx   CVA (cerebral infarction)   Cellulitis   Leukocytosis   Severe peripheral vascular disease bilateral lower extremity critical limb ischemia/ulceration of the left below-knee stump  manifested by right necrotic heel wound and right lateral full thickness foot wound with exposed bone, cellulitis of right lower leg  Followed by vascular surgery not a candidate for open surgical bypass per Dr. Hart Rochester She has ABI of 0.2 with multilevel occlusive disease.  Doubt  she will heal these ulcers and resolve this infection even with attempt at revascularization percutaneously Patient is not ready for above-knee amputation yet Continue broad-spectrum antibiotics vancomycin and cefepime over the next 3-5 days If she has progression of cellulitis, she will need leg amputation Dr. [name] will make decision regarding whether he would like to proceed with angiography early next week Follow blood culture MRI of the right lower extremity pending, I do not feel that this will change the course of treatment   Primary hypertension/coronary artery disease/mild aortic stenosis Cardiology consultation today Last 2-D echo October 2015 with an EF of 45-50% Extremely high risk of having another cardiac arrest Diffuse coronary artery disease and not a candidate for revascularization as per Dr. Excell Seltzer Repeat 2-D echo per cardiology  ESRD - MWF - HD via AVF actually had been going reasonably well all things considered, will plan for HD Friday   Hypertension On midodrine, metoprolol on non hemodialysis days  Anemia per nephrology Hemoglobin stable    Code Status:  full Family Communication: Discussed with the patient's daughter, Briana, Gould , several times today Disposition Plan:  As above    Brief narrative: This is a 69 y.o. female who we've been consulted on regarding bilateral leg wounds and cellulitis. She is accompanied by her daughter. She reports acute onset of redness and pain to her right lower leg since last night. She was also vomiting last night. She was recently seen in the office on 12/03/14 by Dr. [name] who declared her to have bilateral lower extremity critical limb ischemia. She reports a wound to her left below knee stump and two wounds on her right foot that started about six weeks ago. She has been going to the wound center for treatment. She reports some improvement in her wounds but was referred to Dr. [name] for evaluate circulation for wound healing. She has a PMH of three vessel CAD, AS, ESRD on HD, and DM II. She has also had three cardiac arrests during previous hospital admissions. Given her significant co-morbidities, she was deemed not to be a surgical candidate. Plans were made for arteriogram with bilateral runoff and intervention. According to her daughter, the patient was to have an MRI to evaluate for osteomyelitis of her right foot.  She's not had any episodes of chest pain. Her breathing is stable. She's had a slow gradual decline in health. She's had difficulty with dialysis for the past couple days. They've not been able to pull as much fluid as they would like.  She's not had a stress Myoview study in a long time.  She had a she's had progressive cellulitis of her right lower leg for the past several weeks. She's been seen by  Dr. [name] for possible revascularization .   Her past 3 surgical procedures have been complicated by cardiac arrest and prolonged hospitalizations. She's been deemed not a surgical candidate for open revascularization. She may be a candidate for  amputation.   Consultants:    Cardiology  Vascular surgery  Nephrology    Procedures:  Hemodialysis  Antibiotics: Vancomycin cefepime  HPI/Subjective: Patient denies pain in both her legs, afebrile  Objective: Filed Vitals:   12/07/14 2005 12/07/14 2024 12/07/14 2057 12/08/14 0522  BP: 117/61 116/56 125/55 132/59  Pulse: 81 80 81 81  Temp:  98 F (36.7 C) 98.9 F (37.2 C) 99.8 F (37.7 C)  TempSrc:  Oral Oral Oral  Resp:  16 16 18   Height:      Weight:  74.4 kg (164 lb 0.4 oz)    SpO2:  100% 100% 98%    Intake/Output Summary (Last 24 hours) at 12/08/14 1138 Last data filed at 12/08/14 0945  Gross per 24 hour  Intake    557 ml  Output    832 ml  Net   -275 ml    Exam:  General: No acute respiratory distress Lungs: Clear to auscultation bilaterally without wheezes or crackles Cardiovascular: Regular rate and rhythm without murmur gallop or rub normal S1 and S2 Abdomen: Nontender, nondistended, soft, bowel sounds positive, no rebound, no ascites, no appreciable mass Extremities: cellulitis of right lower extremity from below knee down to foot with blisters on upper thigh. Necrotic right heel wound. Full thickness wound with exposed bone to right lateral foot. Left below knee stump with ulceration at distal stump.      Data Reviewed: Basic Metabolic Panel:  Recent Labs Lab 12/07/14 1143 12/08/14 0630  NA 134* 134*  K 4.2 3.8  CL 94* 96  CO2 23 26  GLUCOSE 217* 207*  BUN 50* 26*  CREATININE 6.69* 4.05*  CALCIUM 10.1 9.2    Liver Function Tests:  Recent Labs Lab 12/07/14 1143  AST 25  ALT 20  ALKPHOS 118*  BILITOT 1.3*  PROT 7.0  ALBUMIN 3.3*   No results for input(s): LIPASE, AMYLASE in the last 168 hours. No results for input(s): AMMONIA in the last 168 hours.  CBC:  Recent Labs Lab 12/07/14 1143 12/08/14 0630  WBC 16.6* 13.4*  NEUTROABS 15.1*  --   HGB 11.3* 10.3*  HCT 34.1* 30.9*  MCV 96.1 95.7  PLT 225 202     Cardiac Enzymes: No results for input(s): CKTOTAL, CKMB, CKMBINDEX, TROPONINI in the last 168 hours. BNP (last 3 results) No results for input(s): BNP in the last 8760 hours.  ProBNP (last 3 results) No results for input(s): PROBNP in the last 8760 hours.    CBG:  Recent Labs Lab 12/07/14 2053 12/08/14 0759  GLUCAP 86 171*    Recent Results (from the past 240 hour(s))  Culture, blood (routine x 2)     Status: None (Preliminary result)   Collection Time: 12/07/14 11:31 AM  Result Value Ref Range Status   Specimen Description BLOOD HAND RIGHT  Final   Special Requests BOTTLES DRAWN AEROBIC AND ANAEROBIC 5CC  Final   Culture   Final           BLOOD CULTURE RECEIVED NO GROWTH TO DATE CULTURE WILL BE HELD FOR 5 DAYS BEFORE ISSUING A FINAL NEGATIVE REPORT Performed at [facility]    Report Status PENDING  Incomplete  Culture, blood (routine x 2)     Status: None (Preliminary  result)   Collection Time: 12/07/14 11:43 AM  Result Value Ref Range Status   Specimen Description BLOOD RIGHT FOREARM  Final   Special Requests BOTTLES DRAWN AEROBIC AND ANAEROBIC 5CC  Final   Culture   Final           BLOOD CULTURE RECEIVED NO GROWTH TO DATE CULTURE WILL BE HELD FOR 5 DAYS BEFORE ISSUING A FINAL NEGATIVE REPORT Performed at [facility]    Report Status PENDING  Incomplete     Studies: Dg Knee Complete 4 Views Left  11/17/2014   CLINICAL DATA:  Open wound.  Ulceration.  EXAM: LEFT KNEE - COMPLETE 4+ VIEW  COMPARISON:  None.  FINDINGS: Surgical clips noted over the left lower extremity. Patient has had prior per lobe knee amputation. Peripheral vascular calcifications present. No evidence of effusion. Ulceration noted of the distal stomach. No acute or underlying bony abnormality.  IMPRESSION: 1. Left BKA. Ulceration of the soft tissues of the distal stump. No underlying bony abnormality identified. 2. Peripheral vascular disease.   Electronically Signed   By: Maisie Fus   Register   On: 11/17/2014 13:47   Dg Foot Complete Right  11/17/2014   CLINICAL DATA:  Right foot ulcer laterally  EXAM: RIGHT FOOT COMPLETE - 3+ VIEW  COMPARISON:  None.  FINDINGS: Leandra Kern is noted within the lateral soft tissues consistent with the patient's given clinical history. No underlying bony destruction is identified to suggest osteomyelitis at this time. Mild osteopenia is seen. Diffuse vascular calcifications are noted.  IMPRESSION: Soft tissue wound without definitive bony abnormality.   Electronically Signed   By: Alcide Clever M.D.   On: 11/17/2014 13:47    Scheduled Meds: . aspirin  81 mg Oral Daily  . atropine  1 application Left Eye Q1200  . [START ON 12/09/2014] calcitRIOL  1.25 mcg Oral Q M,W,F-HD  . [START ON 12/09/2014] ceFEPime (MAXIPIME) IV  2 g Intravenous Q M,W,F-HD  . collagenase  1 application Topical Once per day on Sun Tue Thu Sat  . docusate sodium  100 mg Oral BID  . famotidine  20 mg Oral QHS  . feeding supplement (PRO-STAT SUGAR FREE 64)  30 mL Oral BID  . heparin  5,000 Units Subcutaneous 3 times per day  . insulin aspart  0-5 Units Subcutaneous QHS  . insulin aspart  0-9 Units Subcutaneous TID WC  . metoCLOPramide  10 mg Oral TID AC & HS  . [START ON 12/09/2014] metoprolol tartrate  12.5 mg Oral Q M,W,F-HD  . [START ON 12/09/2014] midodrine  10 mg Oral Q M,W,F-HD  . midodrine  5 mg Oral BID WC  . multivitamin  1 tablet Oral QHS  . neomycin-bacitracin-polymyxin  1 application Topical Daily  . polyethylene glycol  17 g Oral Daily  . sevelamer carbonate  800 mg Oral TID WC  . sodium chloride  3 mL Intravenous Q12H  . vancomycin  1,250 mg Intravenous NOW  . 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 If 7PM-7AM, please contact night-coverage at [email or web address], password [facility] 12/08/2014, 11:38  AM  LOS: 1 day

## 2014-12-08 NOTE — Progress Notes (Signed)
ANTIBIOTIC CONSULT NOTE - INITIAL  Pharmacy Consult for Vanco + Cefepime Indication: Cellulitis  Allergies  Allergen Reactions  . Phenergan [Promethazine Hcl] Other (See Comments)    Causes patient to" vomit more"  . Vitamin D Analogs Rash    She was able to take Hectorol 07/2014 admission and is now tolerating calcitriol 11/2014    Patient Measurements: Height:  (165.1 cm) Weight: 164 lb 0.4 oz (74.4 kg) IBW/kg (Calculated) : 57 Adjusted Body Weight:    Vital Signs: Temp: 99.8 F (37.7 C) (03/31 0522) Temp Source: Oral (03/31 0522) BP: 132/59 mmHg (03/31 0522) Pulse Rate: 81 (03/31 0522) Intake/Output from previous day: 03/30 0701 - 03/31 0700 In: 317 [P.O.:317] Out: 832  Intake/Output from this shift:    Labs:  Recent Labs  12/07/14 1143 12/08/14 0630  WBC 16.6* 13.4*  HGB 11.3* 10.3*  PLT 225 202  CREATININE 6.69* 4.05*   Estimated Creatinine Clearance: 13.2 mL/min (by C-G formula based on Cr of 4.05). No results for input(s): VANCOTROUGH, VANCOPEAK, VANCORANDOM, GENTTROUGH, GENTPEAK, GENTRANDOM, TOBRATROUGH, TOBRAPEAK, TOBRARND, AMIKACINPEAK, AMIKACINTROU, AMIKACIN in the last 72 hours.   Microbiology: No results found for this or any previous visit (from the past 720 hour(s)).  Medical History: Past Medical History  Diagnosis Date  . PAD (peripheral artery disease)     a. L BKA  . Type II diabetes mellitus   . Anemia   . History of blood transfusion 2002    "related to amputation" (08/13/2013)  . End stage renal disease on dialysis     "[facility]; M, W, F" (08/13/2013)  . DVT (deep venous thrombosis)     a. R IJ DVT 08/2013.  Marland Kitchen CVA (cerebral infarction)     a. MRI 08/2013: possible left motor strip infarct with remote pontine infarct.  . Pulmonary HTN     a. Cath 2011 - mod to severe pulmonary hypertension. b. Echo 08/2013: PAP (mild-mod increased).  . Thrombocytopenia   . CAD (coronary artery disease)     a. Severe  diffuse diabetic disease by cath 2011, for med rx, not a candidate for CABG or PCI at that time.  . Aortic stenosis     mild  . Syncope     a. 2011, etiology never clear.  . Carotid artery disease     a. Dopp 16/1096: 1-39% LICA< 60-79% RICA borderline >80%.  . Cerebrovascular disease     a. 08/2013 MRA brain: multiple bilateral moderate and high grade intracranial stenoses  . Cardiac arrest 08/14/13    a. Possible arrest in setting of hypotension during dialysis with possible arrest requiring brief CPR, not intubated and was fully intact after episode. Suspected due to low cerebral perfusion in setting of hypotension. Unclear if any arrhythmia.  . End stage renal disease on dialysis   . Peripheral arterial occlusive disease   . Hypertension   . Diabetes mellitus type 2, uncontrolled, with complications   . DVT (deep venous thrombosis)      Right internal jugular vein  . CAD (coronary artery disease), native coronary artery 2011    Diffuse distal vessel CAD noted at cardiac catheterization  . MI (myocardial infarction)   . Stroke   . GIB (gastrointestinal bleeding)   . CHF (congestive heart failure)   . Cellulitis and abscess of leg 11/2014    rt leg     Assessment: R leg redness and swelling  Anticoagulation: SQ heparin (h/o DVT 12/14)  Infectious Disease: nonhealing ulcers right  foot and heel with cellulitis to proximal calf on Cefepime/Vanco D#2 for wound infection. Pt has severe PAD (already had L BKA, will eventually require R AKA).Tm 101.2. Currently 99.8. WBC elevated to 16.6>13.4. Abx dosed for ESRD. Cefepime 3/30>> Vanco 3/30>> Flagyl 3/30>3/31  3/30: BC x 2: pending  Cardiovascular: PAD, CAD with MI, AS, HTN, pulm HTN, CHF Meds: ASA81, Midodrine  Endocrinology: DM2 uncontrolled.on SSI  Gastrointestinal / Nutrition: h/o GIB on Pepcid, Reglan, docusate  Neurology: h/o CVA  Nephrology: ESRD (o/p HD M/W/F) on Cacitriol, Renavite, Renvela  Pulmonary:   Hematology  / Oncology: h/o anemia. Hgb 11.3>10.3  PTA Medication Issues  Best Practices: SQ heparin, Pepcid   Goal of Therapy:  Vanco level <20 prior to redosing post-HD  Plan:  F/u BC x 2 Change Cefepime to 2g IV qHD MWF (Vanco 3/30 received BEFORE HD). Repeat Vanco 1250mg  IV x 1 today. Schedule Vanco 750mg  IV q HD MWF  Keryl Gholson S. Merilynn Finlandobertson, PharmD, BCPS Clinical Staff Pharmacist Pager [phone]   Misty Stanleyobertson, Addysen Louth Stillinger 12/08/2014,9:37 AM

## 2014-12-08 NOTE — Consult Note (Addendum)
WOC wound consult note Reason for Consult: Consult requested for several wounds.  Daughter at bedside is well-informed regarding topical treatment.  Pt is followed by the outpatient wound center and receives Santyl ointment to right heel and right outer ankle wounds every other day.  She has silver hydrofiber changed every other day to left stump wounds.  She recently burned her right thigh and has been using Neosporin ointment to this site. Wound type: Left stump surgery was performed 14 years ago, according to the daughter. It re-opened and evolved into full thickness wounds several months ago, and has been slowly improving since followed by the wound care center.  2 full thickness wounds; entire affected area is approx 8X4cm and open areas are each approx 2X1X.1cm, moist yellow wound beds, no odor, surrounded by dry peeling skin.   Right outer thigh with previous partial thickness burn; 10X5 cm raised clear-fluid filled blister, no open wound, odor or drainage at this time.   Right heel with chronic unstageable wound; 1.75X2cm tightly adhered intact eschar, small amt tan drainage, no odor Right inner ankle with chronic full thickness wound; 1.5X1.5X1cm, bone palpable with swab, 100% yellow wound bed surrounded with 1 cm erythremia, no odor, mod amt yellow drainage.  Right leg with generalized dark reddish-purple erythremia and edema; cellulitis has been previously marked; pt is on IV antibiotics, painful to touch. Several small raised lesions to right upper calf, each approx .1X.1cm, dry with white centers.  No open wounds or drainage.  These could be related to possible beginning calciphylaxis. No topical treatment to this site indicated at this time. Pressure Ulcer POA: Yes Dressing procedure/placement/frequency: Float right heel to reduce pressure.  Continue plan of care from the wound care center: Santyl ointment to right heel and right outer ankle wounds every other day, and Aquacel to left stump  wounds every other day.  Neosporin ointment and foam dressing to promote healing and protect wound to right thigh Q day. Pt has MRI pending to R/O osteomyelitis, and ultrasound scheduled  to R/O DVT and assess blood flow to RLE. Pt can resume follow-up with the outpatient wound care center after discharge.  Discussed plan of care with daughter at bedside and she verbalizes understanding. Please re-consult if further assistance is needed.  Thank-you,  Cammie Mcgeeawn Kahley Leib MSN, RN, CWOCN, [place], CNS [phone]

## 2014-12-08 NOTE — Progress Notes (Signed)
Utilization review completed.  

## 2014-12-08 NOTE — Progress Notes (Signed)
INITIAL NUTRITION ASSESSMENT  DOCUMENTATION CODES Per approved criteria  -Not Applicable   INTERVENTION: -Increase 30 ml Prostat to TID -Nepro Shake po daily, each supplement provides 425 kcal and 19 grams protein  NUTRITION DIAGNOSIS: Increased nutrient needs related to wound healing as evidenced by estimated needs.   Goal: Pt will meet >90% of estimated nutritional needs  Monitor:  PO/supplement intake, labs, weight changes, I/O's  Reason for Assessment: Consult for wound healing  69 y.o. female  Admitting Dx: <principal problem not specified>  Briana Gould is a 69 y.o. female  With multiple chronic medical issues. She came to the ER after she was too weak to go to dialysis. Her right leg had an ulcer with spreading redness.  She was seen by Dr. Imogene Burnhen 3/24 with right heel ulcer, referred by her wound care center. She developed the wound about 6 weeks ago and it has gradually become larger. She has been going to wound care at [facility]. Dr. Imogene Burnhen stated that he doubts she is a surgical candidate as she has coded 3 times during previous procedures. He recommends an aortogram, bilateral runoff and intervention.  Wound care doctor was sending to get MRI to r/o osteomyelitis  ASSESSMENT: Pt admitted for possible osteomyelitis.  Pt somnolent at time of visit. She has HD earlier this AM. Noted 20% meal completion. Wt has been stable over the past 3 months. Noted mild wt loss over the past, however, not significant for time frame. Wt change is difficult to assess, due to fluid changes from HD.  Pt with multiple wounds, who is followed by the local wound clinic. [facility], nephrology, and vasuclar surgery following. Plan is to continue antibiotics for 3-5 days and possibly proceed with amputation if no improvement. RD will increase Prostat supplement to TID and add Nepro daily, due to increased nutrient needs for HD and multiple wounds  No signs of fat or muscle depletion  noted.  Labs reviewed. Na: 134, BUN/Creat: 26/4.05, Glucose: 207, CBGS: 86-175.   Height: Ht Readings from Last 1 Encounters:  12/07/14 5\' 5"  (1.651 m)    Weight: Wt Readings from Last 1 Encounters:  12/07/14 164 lb 0.4 oz (74.4 kg)    Ideal Body Weight: 115#  % Ideal Body Weight: 143%  Wt Readings from Last 10 Encounters:  12/07/14 164 lb 0.4 oz (74.4 kg)  12/01/14 165 lb (74.844 kg)  08/19/14 165 lb 5.5 oz (75 kg)  07/29/14 177 lb 6.4 oz (80.468 kg)  07/06/14 171 lb 1.2 oz (77.6 kg)  01/06/14 180 lb (81.647 kg)  12/18/13 171 lb 4.8 oz (77.7 kg)  11/24/13 181 lb 3.5 oz (82.2 kg)  10/25/13 175 lb 4.3 oz (79.5 kg)  10/15/13 178 lb 12.7 oz (81.1 kg)    Usual Body Weight: 175#  % Usual Body Weight: 94%  BMI:  29.67  Estimated Nutritional Needs: Kcal: 2300-2500 Protein: 135-145 grams Fluid: >1.5 L  Skin: 2 full thickness wounds on lt stump, rt outer thigh ttickness burn, unstageable wound on rt heel, full thickness wounds on rt outer ankle.  Diet Order: Diet renal/carb modified with fluid restriction Diet-HS Snack?: Nothing; Room service appropriate?: Yes; Fluid consistency:: Thin  EDUCATION NEEDS: -Education not appropriate at this time   Intake/Output Summary (Last 24 hours) at 12/08/14 1314 Last data filed at 12/08/14 0945  Gross per 24 hour  Intake    557 ml  Output    832 ml  Net   -275 ml  Last BM: 12/07/14  Labs:   Recent Labs Lab 12/07/14 1143 12/08/14 0630  NA 134* 134*  K 4.2 3.8  CL 94* 96  CO2 23 26  BUN 50* 26*  CREATININE 6.69* 4.05*  CALCIUM 10.1 9.2  GLUCOSE 217* 207*    CBG (last 3)   Recent Labs  12/07/14 2053 12/08/14 0759 12/08/14 1233  GLUCAP 86 171* 175*    Scheduled Meds: . aspirin  81 mg Oral Daily  . atropine  1 application Left Eye Q1200  . [START ON 12/09/2014] calcitRIOL  1.25 mcg Oral Q M,W,F-HD  . [START ON 12/09/2014] ceFEPime (MAXIPIME) IV  2 g Intravenous Q M,W,F-HD  . collagenase  1 application  Topical Once per day on Sun Tue Thu Sat  . docusate sodium  100 mg Oral BID  . famotidine  20 mg Oral QHS  . feeding supplement (PRO-STAT SUGAR FREE 64)  30 mL Oral BID  . heparin  5,000 Units Subcutaneous 3 times per day  . insulin aspart  0-5 Units Subcutaneous QHS  . insulin aspart  0-9 Units Subcutaneous TID WC  . metoCLOPramide  10 mg Oral TID AC & HS  . [START ON 12/09/2014] metoprolol tartrate  12.5 mg Oral Q M,W,F-HD  . [START ON 12/09/2014] midodrine  10 mg Oral Q M,W,F-HD  . midodrine  5 mg Oral BID WC  . multivitamin  1 tablet Oral QHS  . neomycin-bacitracin-polymyxin  1 application Topical Daily  . polyethylene glycol  17 g Oral Daily  . sevelamer carbonate  800 mg Oral TID WC  . sodium chloride  3 mL Intravenous Q12H  . [START ON 12/09/2014] vancomycin  750 mg Intravenous Q M,W,F-HD    Continuous Infusions:   Past Medical History  Diagnosis Date  . PAD (peripheral artery disease)     a. L BKA  . Type II diabetes mellitus   . Anemia   . History of blood transfusion 2002    "related to amputation" (08/13/2013)  . End stage renal disease on dialysis     "[facility]; M, W, F" (08/13/2013)  . DVT (deep venous thrombosis)     a. R IJ DVT 08/2013.  Marland Kitchen CVA (cerebral infarction)     a. MRI 08/2013: possible left motor strip infarct with remote pontine infarct.  . Pulmonary HTN     a. Cath 2011 - mod to severe pulmonary hypertension. b. Echo 08/2013: PAP (mild-mod increased).  . Thrombocytopenia   . CAD (coronary artery disease)     a. Severe diffuse diabetic disease by cath 2011, for med rx, not a candidate for CABG or PCI at that time.  . Aortic stenosis     mild  . Syncope     a. 2011, etiology never clear.  . Carotid artery disease     a. Dopp 16/1096: 1-39% LICA< 60-79% RICA borderline >80%.  . Cerebrovascular disease     a. 08/2013 MRA brain: multiple bilateral moderate and high grade intracranial stenoses  . Cardiac arrest 08/14/13    a. Possible  arrest in setting of hypotension during dialysis with possible arrest requiring brief CPR, not intubated and was fully intact after episode. Suspected due to low cerebral perfusion in setting of hypotension. Unclear if any arrhythmia.  . End stage renal disease on dialysis   . Peripheral arterial occlusive disease   . Hypertension   . Diabetes mellitus type 2, uncontrolled, with complications   . DVT (deep venous thrombosis)  Right internal jugular vein  . CAD (coronary artery disease), native coronary artery 2011    Diffuse distal vessel CAD noted at cardiac catheterization  . MI (myocardial infarction)   . Stroke   . GIB (gastrointestinal bleeding)   . CHF (congestive heart failure)   . Cellulitis and abscess of leg 11/2014    rt leg    Past Surgical History  Procedure Laterality Date  . Av fistula placement Left 2011    upper arm  . Below knee leg amputation Left 2002  . Tonsillectomy    . Cataract extraction w/ intraocular lens  implant, bilateral Bilateral 2012-2014  . Esophagogastroduodenoscopy N/A 08/30/2013    Procedure: ESOPHAGOGASTRODUODENOSCOPY (EGD);  Surgeon: Theda Belfast, MD;  Location: [facility] ENDOSCOPY;  Service: Endoscopy;  Laterality: N/A;  . Below knee leg amputation Left   . Av fistula placement Left   . Left and right heart catheterization with coronary angiogram Right 11/22/2013    Procedure: LEFT AND RIGHT HEART CATHETERIZATION WITH CORONARY ANGIOGRAM;  Surgeon: Peter M Swaziland, MD;  Location: [facility] CATH LAB;  Service: Cardiovascular;  Laterality: Right;    Jona Erkkila A. Mayford Knife, RD, LDN, CDE Pager: [phone] After hours Pager: [phone]

## 2014-12-08 NOTE — Progress Notes (Addendum)
Briana Gould Progress Note  Assessment/Plan: 1. Fever/weakness likely related to right lower extremity wounds (right heel ulcer and right lateral ulcer over the 5th metatarsal) with severe celllulitis- for MRI, [facility] has seen - cards to see in case surgery is needed; huge surgical risk due to prior arrests  - AKA may be her best option but will treat through the weekend with antibiotics on Vanc/Maxipime and flagyl - hx of prior MSSA wound culture in Jan 2016. BC pending  2. ESRD - MWF - HD via AVF actually had been going reasonably well all things considered, will plan for HD Friday 3. Hypertension/volume - chronic hypotension on midodrine - sats ok on room - has been getting +/- EDW of 72.5 with average UF of 2-3 L; net UF only 832 yesterday - needs more volume off - BP limiting maybe due to infection but will increase midodrine to 10 pre HD, use temp 36 and profile 4  4. Anemia -has been on Mircera 75 q 4 weeks last given 3/23, due 3 more doses of venofer; follow Hgb Hgb 10.3 - expect to have some ESA resistance due to inflammation - redose with Aranesp if < 10 on Friday  5. Metabolic bone disease - allergies list vit D analogue but can take calcitriol (and previously took Hectorol last admission) /renvela 6. Nutrition - renal diet + vitamin 7. severe ASCVD - with 3 vessel CAD, PVD, hx CVA - full code  8. DM - per primary  Sheffield Slider, PA-C [facility] [phone] 12/08/2014,9:00 AM  LOS: 1 day    Patient seen and examined, agree with above note with above modifications. A little more somnolent today- maybe due to pain meds- leg looks a little less erythematous- I added on nepro and tried to allay family fears that fluid restriction is not that severe and we do not want to sacrifice nutrition for fluid restriction- HD tomorrow- VVS comments noted  Briana Sable, MD 12/08/2014     Subjective:   Feels a little better, BP dropped on HD  yesterday- family at bedside some concerns regarding fluid restriction, her somnolence  Objective Filed Vitals:   12/07/14 2005 12/07/14 2024 12/07/14 2057 12/08/14 0522  BP: 117/61 116/56 125/55 132/59  Pulse: 81 80 81 81  Temp:  98 F (36.7 C) 98.9 F (37.2 C) 99.8 F (37.7 C)  TempSrc:  Oral Oral Oral  Resp:  Height:      Weight:  74.4 kg (164 lb 0.4 oz)    SpO2:  100% 100% 98%   Physical Exam General: alert NAD Heart: RRR Lungs: dim BS rare crackle Abdomen: obese soft Extremities:right LE inflammed below knee in outlined area - redder than yesterday but has not spread + edema; left AKA wrapped (wounds not examined) Dialysis Access: left upper AVF + bruit  Dialysis Orders: Center: [place] MWF 4 hr 180 EDW 72.5 350/A 1.5 2K 2 Ca profile 4 left upper AVF heparin 5000 Mircera 75 q 4 weeks last given 3/23 venofer 100 q HD has had 2 of 5 doses, calcitriol 1.25 q HD Recent labs: Hgb 10.6 3/23 - usual range, iPTH 658 3/18 down from 1273 1/21 and 2348 05/2014  Additional Objective Labs: Basic Metabolic Panel:  Recent Labs Lab 12/07/14 1143 12/08/14 0630  NA 134* 134*  K 4.2 3.8  CL 94* 96  CO2 23 26  GLUCOSE 217* 207*  BUN 50* 26*  CREATININE 6.69* 4.05*  CALCIUM 10.1 9.2  Liver Function Tests:  Recent Labs Lab 12/07/14 1143  AST 25  ALT 20  ALKPHOS 118*  BILITOT 1.3*  PROT 7.0  ALBUMIN 3.3*   CBC:  Recent Labs Lab 12/07/14 1143 12/08/14 0630  WBC 16.6* 13.4*  NEUTROABS 15.1*  --   HGB 11.3* 10.3*  HCT 34.1* 30.9*  MCV 96.1 95.7  PLT 225 202   Blood Culture    Component Value Date/Time   SDES BLOOD RIGHT HAND 07/12/2014 1433   SPECREQUEST BOTTLES DRAWN AEROBIC AND ANAEROBIC 5CC 07/12/2014 1433   CULT  07/12/2014 1433    NO GROWTH 5 DAYS Performed at [facility]    REPTSTATUS 07/18/2014 FINAL 07/12/2014 1433   CBG:  Recent Labs Lab 12/07/14 2053 12/08/14 0759  GLUCAP 86 171*  Medications:   . aspirin  81 mg Oral  Daily  . [START ON 12/09/2014] calcitRIOL  1.25 mcg Oral Q M,W,F-HD  . ceFEPime (MAXIPIME) IV  1 g Intravenous Q24H  . docusate sodium  100 mg Oral BID  . famotidine  20 mg Oral QHS  . feeding supplement (PRO-STAT SUGAR FREE 64)  30 mL Oral BID  . heparin  5,000 Units Subcutaneous 3 times per day  . insulin aspart  0-5 Units Subcutaneous QHS  . insulin aspart  0-9 Units Subcutaneous TID WC  . metoCLOPramide  10 mg Oral TID AC & HS  . metronidazole  500 mg Intravenous Q8H  . midodrine  5 mg Oral BID WC  . multivitamin  1 tablet Oral QHS  . polyethylene glycol  17 g Oral Daily  . sevelamer carbonate  800 mg Oral TID WC  . sodium chloride  3 mL Intravenous Q12H

## 2014-12-08 NOTE — Consult Note (Signed)
CONSULT NOTE  Date: 12/08/2014               Patient Name:  Briana Gould MRN: [id number]  DOB: [birth year] Age / Sex: 69 y.o., female        PCP: [name] Primary Cardiologist: Excell Seltzer             Referring Physician: Susie Cassette               Reason for Consult:  preop visit prior to possible vascular surgery            History of Present Illness: Patient is a 69 y.o. female with a PMHx of chronic renal insufficiency, severe peripheral vascular disease, diabetes mellitus, pulmonary hypertension, coronary artery disease, mild aortic stenosis,, essential hypertension, history of stroke, history of GI bleed who was admitted to [facility] on 12/07/2014 for evaluation of cellulitis and ischemic leg..   She's not had any episodes of chest pain. Her breathing is stable. She's had a slow gradual decline in health. She's had difficulty with dialysis for the past couple days. They've not been able to pull as much fluid as they would like.  She's not had a stress Myoview study in a long time.  She had a she's had progressive cellulitis of her right lower leg for the past several weeks. She's been seen by Dr. [name] for possible revascularization .   Her past 3 surgical procedures have been complicated by cardiac arrest and prolonged hospitalizations. She's been deemed not a surgical candidate for open revascularization. She may be a candidate for amputation.  Medications: Outpatient medications: Prescriptions prior to admission  Medication Sig Dispense Refill Last Dose  . acetaminophen (TYLENOL) 500 MG tablet Take 500 mg by mouth every 6 (six) hours as needed for mild pain or moderate pain.   12/07/2014 at Unknown time  . aspirin 81 MG chewable tablet Chew 1 tablet (81 mg total) by mouth daily.   12/06/2014 at Unknown time  . atropine 1 % ophthalmic ointment Place 1 application into the left eye daily at 12 noon.   12/07/2014 at Unknown time  . b complex-vitamin c-folic acid (NEPHRO-VITE) 0.8  MG TABS tablet Take 1 tablet by mouth daily.   12/06/2014 at Unknown time  . collagenase (SANTYL) ointment Apply 1 application topically every other day. Apply with dressing change to right foot   12/06/2014 at Unknown time  . glimepiride (AMARYL) 2 MG tablet TAKE 1 TABLET EVERY DAY WITH BREAKFAST 30 tablet 1 12/06/2014 at Unknown time  . metoCLOPramide (REGLAN) 10 MG tablet Take 1 tablet (10 mg total) by mouth 4 (four) times daily -  before meals and at bedtime. (Patient taking differently: Take 10 mg by mouth 3 (three) times daily before meals. ) 120 tablet 1 12/06/2014 at Unknown time  . metoprolol tartrate (LOPRESSOR) 12.5 mg TABS tablet 1 tab twice daily Sunday Tuesdays Thursdays Saturdays (Patient taking differently: 12.5 mg taking twice daily on non-dialysis days (Sunday Tuesdays Thursdays Saturdays)) 60 tablet 1 12/06/2014 at 2000  . midodrine (PROAMATINE) 5 MG tablet Take 1 tablet (5 mg total) by mouth 2 (two) times daily with a meal. 60 tablet 1 12/06/2014 at Unknown time  . multivitamin (RENA-VIT) TABS tablet Take 1 tablet by mouth at bedtime. 30 tablet 0 12/06/2014 at Unknown time  . Nutritional Supplements (FEEDING SUPPLEMENT, NEPRO CARB STEADY,) LIQD Take 237 mLs by mouth daily at 12 noon.   12/07/2014 at Unknown time  . OVER  THE COUNTER MEDICATION Apply 1 application topically every other day. Silver Alginate cream with dressing change applied to left leg   12/06/2014 at Unknown time  . sevelamer carbonate (RENVELA) 800 MG tablet Take 1 tablet (800 mg total) by mouth 3 (three) times daily with meals. 90 tablet 1 12/06/2014 at Unknown time  . acetaminophen (TYLENOL) 325 MG tablet Take 2 tablets (650 mg total) by mouth every 6 (six) hours as needed for moderate pain. (Patient not taking: Reported on 12/07/2014)   Not Taking at Unknown time  . Amino Acids-Protein Hydrolys (FEEDING SUPPLEMENT, PRO-STAT SUGAR FREE 64,) LIQD Take 30 mLs by mouth 2 (two) times daily at 10 AM and 5 PM. (Patient not taking:  Reported on 12/07/2014) 900 mL 0 Not Taking at Unknown time  . darbepoetin (ARANESP) 60 MCG/0.3ML SOLN injection Inject 60 mcg into the skin every 7 (seven) days. Wednesdays with dialysis   Not Taking at Unknown time  . docusate sodium 100 MG CAPS Take 100 mg by mouth 2 (two) times daily. (Patient not taking: Reported on 12/07/2014) 10 capsule 0 Not Taking at Unknown time  . famotidine (PEPCID) 20 MG tablet Take 1 tablet (20 mg total) by mouth at bedtime. (Patient not taking: Reported on 12/07/2014) 30 tablet 1 Not Taking at Unknown time  . guaiFENesin (MUCINEX) 600 MG 12 hr tablet Take 1 tablet (600 mg total) by mouth 2 (two) times daily. (Patient not taking: Reported on 12/07/2014) 30 tablet 0 Not Taking at Unknown time  . oxyCODONE (OXY IR/ROXICODONE) 5 MG immediate release tablet Take 1-2 tablets (5-10 mg total) by mouth every 4 (four) hours as needed for severe pain. (Patient not taking: Reported on 12/07/2014) 90 tablet 0 Not Taking at Unknown time  . polyethylene glycol (MIRALAX / GLYCOLAX) packet Take 17 g by mouth daily. (Patient not taking: Reported on 12/07/2014) 14 each 0 Not Taking at Unknown time  . prednisoLONE acetate (PRED FORTE) 1 % ophthalmic suspension Place 1 drop into both eyes 3 (three) times daily. (Patient not taking: Reported on 12/07/2014) 5 mL 0 Not Taking at Unknown time    Current medications: Current Facility-Administered Medications  Medication Dose Route Frequency Provider Last Rate Last Dose  . 0.9 %  sodium chloride infusion  250 mL Intravenous PRN Joseph Art, DO      . acetaminophen (TYLENOL) tablet 650 mg  650 mg Oral Q6H PRN Joseph Art, DO   650 mg at 12/07/14 1558   Or  . acetaminophen (TYLENOL) suppository 650 mg  650 mg Rectal Q6H PRN Joseph Art, DO      . aspirin chewable tablet 81 mg  81 mg Oral Daily Joseph Art, DO   81 mg at 12/07/14 2235  . [START ON 12/09/2014] calcitRIOL (ROCALTROL) capsule 1.25 mcg  1.25 mcg Oral Q M,W,F-HD Weston Settle,  PA-C   1.25 mcg at 12/07/14 1812  . ceFEPIme (MAXIPIME) 1 g in dextrose 5 % 50 mL IVPB  1 g Intravenous Q24H Titus Mould, RPH   1 g at 12/07/14 2235  . docusate sodium (COLACE) capsule 100 mg  100 mg Oral BID Joseph Art, DO   100 mg at 12/07/14 1600  . famotidine (PEPCID) tablet 20 mg  20 mg Oral QHS Joseph Art, DO   20 mg at 12/07/14 2235  . feeding supplement (PRO-STAT SUGAR FREE 64) liquid 30 mL  30 mL Oral BID Joseph Art, DO   30 mL at 12/07/14  1700  . heparin injection 5,000 Units  5,000 Units Subcutaneous 3 times per day Joseph Art, DO   5,000 Units at 12/08/14 0605  . insulin aspart (novoLOG) injection 0-5 Units  0-5 Units Subcutaneous QHS Joseph Art, DO   0 Units at 12/07/14 2110  . insulin aspart (novoLOG) injection 0-9 Units  0-9 Units Subcutaneous TID WC Joseph Art, DO   2 Units at 12/08/14 0844  . metoCLOPramide (REGLAN) tablet 10 mg  10 mg Oral TID AC & HS Joseph Art, DO   10 mg at 12/08/14 0605  . metroNIDAZOLE (FLAGYL) IVPB 500 mg  500 mg Intravenous Q8H Jessica U Vann, DO   500 mg at 12/08/14 0845  . [START ON 12/09/2014] midodrine (PROAMATINE) tablet 10 mg  10 mg Oral Q M,W,F-HD Weston Settle, PA-C      . midodrine (PROAMATINE) tablet 5 mg  5 mg Oral BID WC Joseph Art, DO   5 mg at 12/08/14 0845  . morphine 2 MG/ML injection 1 mg  1 mg Intravenous Q4H PRN Joseph Art, DO   1 mg at 12/07/14 2300  . multivitamin (RENA-VIT) tablet 1 tablet  1 tablet Oral QHS Joseph Art, DO   1 tablet at 12/07/14 2235  . ondansetron (ZOFRAN) tablet 4 mg  4 mg Oral Q6H PRN Joseph Art, DO       Or  . ondansetron (ZOFRAN) injection 4 mg  4 mg Intravenous Q6H PRN Joseph Art, DO      . polyethylene glycol (MIRALAX / GLYCOLAX) packet 17 g  17 g Oral Daily Joseph Art, DO   17 g at 12/07/14 2235  . sevelamer carbonate (RENVELA) tablet 800 mg  800 mg Oral TID WC Jessica U Vann, DO   800 mg at 12/08/14 0845  . sodium chloride 0.9 % injection 3 mL  3 mL  Intravenous Q12H Joseph Art, DO   3 mL at 12/07/14 2246  . sodium chloride 0.9 % injection 3 mL  3 mL Intravenous PRN Joseph Art, DO         Allergies  Allergen Reactions  . Phenergan [Promethazine Hcl] Other (See Comments)    Causes patient to" vomit more"  . Vitamin D Analogs Rash    She was able to take Hectorol 07/2014 admission and is now tolerating calcitriol 11/2014     Past Medical History  Diagnosis Date  . PAD (peripheral artery disease)     a. L BKA  . Type II diabetes mellitus   . Anemia   . History of blood transfusion 2002    "related to amputation" (08/13/2013)  . End stage renal disease on dialysis     "[facility]; M, W, F" (08/13/2013)  . DVT (deep venous thrombosis)     a. R IJ DVT 08/2013.  Marland Kitchen CVA (cerebral infarction)     a. MRI 08/2013: possible left motor strip infarct with remote pontine infarct.  . Pulmonary HTN     a. Cath 2011 - mod to severe pulmonary hypertension. b. Echo 08/2013: PAP (mild-mod increased).  . Thrombocytopenia   . CAD (coronary artery disease)     a. Severe diffuse diabetic disease by cath 2011, for med rx, not a candidate for CABG or PCI at that time.  . Aortic stenosis     mild  . Syncope     a. 2011, etiology never clear.  . Carotid artery disease  a. Dopp 32/4401: 1-39% LICA< 60-79% RICA borderline >80%.  . Cerebrovascular disease     a. 08/2013 MRA brain: multiple bilateral moderate and high grade intracranial stenoses  . Cardiac arrest 08/14/13    a. Possible arrest in setting of hypotension during dialysis with possible arrest requiring brief CPR, not intubated and was fully intact after episode. Suspected due to low cerebral perfusion in setting of hypotension. Unclear if any arrhythmia.  . End stage renal disease on dialysis   . Peripheral arterial occlusive disease   . Hypertension   . Diabetes mellitus type 2, uncontrolled, with complications   . DVT (deep venous thrombosis)      Right  internal jugular vein  . CAD (coronary artery disease), native coronary artery 2011    Diffuse distal vessel CAD noted at cardiac catheterization  . MI (myocardial infarction)   . Stroke   . GIB (gastrointestinal bleeding)   . CHF (congestive heart failure)   . Cellulitis and abscess of leg 11/2014    rt leg    Past Surgical History  Procedure Laterality Date  . Av fistula placement Left 2011    upper arm  . Below knee leg amputation Left 2002  . Tonsillectomy    . Cataract extraction w/ intraocular lens  implant, bilateral Bilateral 2012-2014  . Esophagogastroduodenoscopy N/A 08/30/2013    Procedure: ESOPHAGOGASTRODUODENOSCOPY (EGD);  Surgeon: Theda Belfast, MD;  Location: [facility] ENDOSCOPY;  Service: Endoscopy;  Laterality: N/A;  . Below knee leg amputation Left   . Av fistula placement Left   . Left and right heart catheterization with coronary angiogram Right 11/22/2013    Procedure: LEFT AND RIGHT HEART CATHETERIZATION WITH CORONARY ANGIOGRAM;  Surgeon: Peter M Swaziland, MD;  Location: [facility] CATH LAB;  Service: Cardiovascular;  Laterality: Right;    Family History  Problem Relation Age of Onset  . Hypertension Mother   . Hypertension Father   . Heart attack Brother   . Stroke Mother   . Pulmonary embolism Father     Social History:  reports that she has never smoked. She has never used smokeless tobacco. She reports that she does not drink alcohol or use illicit drugs.   Review of Systems: Constitutional:  denies fever, chills, diaphoresis, appetite change and fatigue.  HEENT: denies photophobia, eye pain, redness, hearing loss, ear pain, congestion, sore throat, rhinorrhea, sneezing, neck pain, neck stiffness and tinnitus.  Respiratory: admits to SOB, - chronic   Cardiovascular: denies chest pain, palpitations   Gastrointestinal: denies nausea, vomiting, abdominal pain, diarrhea, constipation, blood in stool.  Genitourinary: denies dysuria, urgency, frequency, hematuria, flank  pain and difficulty urinating.  Musculoskeletal: denies  myalgias, back pain, joint swelling, arthralgias and gait problem.   Skin: denies  Has had  An enlarging rash on her right lower leg recently .  Neurological: denies dizziness, seizures, syncope, weakness, light-headedness, numbness and headaches.   Hematological: denies adenopathy, easy bruising, personal or family bleeding history.  Psychiatric/ Behavioral: denies suicidal ideation, mood changes, confusion, nervousness, sleep disturbance and agitation.    Physical Exam: BP 132/59 mmHg  Pulse 81  Temp(Src) 99.8 F (37.7 C) (Oral)  Resp 18  Ht 5\' 5"  (1.651 m)  Wt 164 lb 0.4 oz (74.4 kg)  BMI 27.29 kg/m2  SpO2 98%  Wt Readings from Last 3 Encounters:  12/07/14 164 lb 0.4 oz (74.4 kg)  12/01/14 165 lb (74.844 kg)  08/19/14 165 lb 5.5 oz (75 kg)    General: Vital signs reviewed and noted.  Well-developed, well-nourished, in no acute distress; alert,   Head: Normocephalic, atraumatic, sclera anicteric,   Neck: Supple. Negative for carotid bruits. No JVD   Lungs:  Clear bilaterally, no  wheezes, rales, or rhonchi. Breathing is normal   Heart: RRR with S1 S2. , soft systolic murmur   Abdomen/ GI :  Soft, non-tender, non-distended with normoactive bowel sounds. No hepatomegaly. No rebound/guarding. No obvious abdominal masses   MSK: Strength and the appear normal for age.   Extremities: She is s/p left BKA. Her right lower leg is erythematous,  C/w cellulitis   Neurologic:  CN are grossly intact,  No obvious motor or sensory defect.  Alert and oriented X 3. Moves all extremities spontaneously.  Psych: Responds to questions appropriately with a normal affect.     Lab results: Basic Metabolic Panel:  Recent Labs Lab 12/07/14 1143 12/08/14 0630  NA 134* 134*  K 4.2 3.8  CL 94* 96  CO2 23 26  GLUCOSE 217* 207*  BUN 50* 26*  CREATININE 6.69* 4.05*  CALCIUM 10.1 9.2    Liver Function Tests:  Recent Labs Lab  12/07/14 1143  AST 25  ALT 20  ALKPHOS 118*  BILITOT 1.3*  PROT 7.0  ALBUMIN 3.3*   No results for input(s): LIPASE, AMYLASE in the last 168 hours. No results for input(s): AMMONIA in the last 168 hours.  CBC:  Recent Labs Lab 12/07/14 1143 12/08/14 0630  WBC 16.6* 13.4*  NEUTROABS 15.1*  --   HGB 11.3* 10.3*  HCT 34.1* 30.9*  MCV 96.1 95.7  PLT 225 202    Cardiac Enzymes: No results for input(s): CKTOTAL, CKMB, CKMBINDEX, TROPONINI in the last 168 hours.  BNP: Invalid input(s): POCBNP  CBG:  Recent Labs Lab 12/07/14 2053 12/08/14 0759  GLUCAP 86 171*    Coagulation Studies: No results for input(s): LABPROT, INR in the last 72 hours.   Other results: Personal review of EKG shows :  - Normal sinus rhythm at 85 beats a minute. She has left bundle branch block.  Imaging:  No results found.   Last Echo 06/28/14  Left ventricle: The cavity size was normal. Wall thickness was increased in a pattern of mild LVH. Systolic function was mildly reduced. The estimated ejection fraction was in the range of 45% to 50%. Probable hypokinesis of the inferior myocardium. - Aortic valve: Valve area (VTI): 1.04 cm^2. Valve area (Vmax): 0.99 cm^2. Valve area (Vmean): 0.95 cm^2. - Mitral valve: Calcified annulus. Mildly thickened leaflets . The findings are consistent with trivial stenosis. There was mild regurgitation. Valve area by pressure half-time: 2.32 cm^2. Valve area by continuity equation (using LVOT flow): 1.35 cm^2. - Left atrium: The atrium was mildly to moderately dilated. - Right ventricle: The cavity size was moderately dilated. Systolic function was mildly reduced. - Right atrium: The atrium was moderately dilated. - Pulmonary arteries: PA peak pressure: 34 mm Hg     Assessment & Plan:  1. Preoperative evaluation: The patient has had cardiac arrest with procedures over the past several years. She is at extremely high risk of having  another cardiac arrest. She has known severe diffuse coronary artery disease and was deemed not a candidate for further revascularization by Dr. Excell Seltzer.  Her left ventricle systolic function has been well preserved. Her last echocardiogram in October revealed an ejection fraction of 45%.  At this point I do not think that a Myoview study would be of any help. We could consider getting an echocardiogram for  further evaluation of her left ventricle function. Unfortunately we have very few options..  2. Coronary artery disease: She has very severe coronary artery disease and is not a candidate for further coronary interventions  3. Peripheral vascular disease: Followed by [facility].  4. End-stage renal disease: On hemodialysis      Alvia GrovePhilip J. Nahser, Jr., MD, [facility] 12/08/2014, 9:17 AM Office - [phone] Pager [phone]

## 2014-12-09 ENCOUNTER — Encounter (HOSPITAL_BASED_OUTPATIENT_CLINIC_OR_DEPARTMENT_OTHER): Payer: Medicare Other | Attending: Internal Medicine

## 2014-12-09 DIAGNOSIS — I2510 Atherosclerotic heart disease of native coronary artery without angina pectoris: Secondary | ICD-10-CM

## 2014-12-09 DIAGNOSIS — L03119 Cellulitis of unspecified part of limb: Secondary | ICD-10-CM

## 2014-12-09 DIAGNOSIS — I70234 Atherosclerosis of native arteries of right leg with ulceration of heel and midfoot: Secondary | ICD-10-CM

## 2014-12-09 LAB — GLUCOSE, CAPILLARY
GLUCOSE-CAPILLARY: 116 mg/dL — AB (ref 70–99)
Glucose-Capillary: 94 mg/dL (ref 70–99)
Glucose-Capillary: 131 mg/dL — ABNORMAL HIGH (ref 70–99)

## 2014-12-09 LAB — COMPREHENSIVE METABOLIC PANEL
ALBUMIN: 2.8 g/dL — AB (ref 3.5–5.2)
ALT: 25 U/L (ref 0–35)
ANION GAP: 15 (ref 5–15)
AST: 46 U/L — ABNORMAL HIGH (ref 0–37)
Alkaline Phosphatase: 135 U/L — ABNORMAL HIGH (ref 39–117)
BUN: 45 mg/dL — AB (ref 6–23)
CHLORIDE: 96 mmol/L (ref 96–112)
CO2: 24 mmol/L (ref 19–32)
CREATININE: 5.46 mg/dL — AB (ref 0.50–1.10)
Calcium: 9.5 mg/dL (ref 8.4–10.5)
GFR calc Af Amer: 8 mL/min — ABNORMAL LOW (ref >90)
GFR calc non Af Amer: 7 mL/min — ABNORMAL LOW (ref >90)
Glucose, Bld: 144 mg/dL — ABNORMAL HIGH (ref 70–99)
Potassium: 4.3 mmol/L (ref 3.5–5.1)
Sodium: 135 mmol/L (ref 135–145)
TOTAL PROTEIN: 6.9 g/dL (ref 6.0–8.3)
Total Bilirubin: 1.5 mg/dL — ABNORMAL HIGH (ref 0.3–1.2)

## 2014-12-09 LAB — CBC
HEMATOCRIT: 35 % — AB (ref 36.0–46.0)
Hemoglobin: 11.6 g/dL — ABNORMAL LOW (ref 12.0–15.0)
MCH: 33 pg (ref 26.0–34.0)
MCHC: 33.1 g/dL (ref 30.0–36.0)
MCV: 99.4 fL (ref 78.0–100.0)
Platelets: 213 10*3/uL (ref 150–400)
RBC: 3.52 MIL/uL — AB (ref 3.87–5.11)
RDW: 18.1 % — ABNORMAL HIGH (ref 11.5–15.5)
WBC: 18.4 10*3/uL — AB (ref 4.0–10.5)

## 2014-12-09 LAB — PREALBUMIN: Prealbumin: 14 mg/dL — ABNORMAL LOW (ref 18.0–45.0)

## 2014-12-09 LAB — HIV ANTIBODY (ROUTINE TESTING W REFLEX): HIV Screen 4th Generation wRfx: NONREACTIVE

## 2014-12-09 MED ORDER — MIDODRINE HCL 5 MG PO TABS
ORAL_TABLET | ORAL | Status: AC
  Filled 2014-12-09: qty 1

## 2014-12-09 MED ORDER — ACETAMINOPHEN 325 MG PO TABS
ORAL_TABLET | ORAL | Status: AC
  Filled 2014-12-09: qty 2

## 2014-12-09 MED ORDER — OXYCODONE HCL 5 MG PO TABS
5.0000 mg | ORAL_TABLET | Freq: Four times a day (QID) | ORAL | Status: DC | PRN
  Administered 2014-12-13: 5 mg via ORAL
  Filled 2014-12-09: qty 1

## 2014-12-09 MED ORDER — MORPHINE SULFATE 2 MG/ML IJ SOLN
INTRAMUSCULAR | Status: AC
  Filled 2014-12-09: qty 1

## 2014-12-09 NOTE — Progress Notes (Signed)
[facility]  Patient Status: Active (receiving services up to time of hospitalization)  [facility] is providing the following services: RN and PT  If patient discharges after hours, please call [phone].   Samul DadaMiranda Chris 12/09/2014, 5:18 PM

## 2014-12-09 NOTE — Progress Notes (Signed)
  Echocardiogram 2D Echocardiogram has been performed.  Janalyn HarderWest, Roshawn Ayala R 12/09/2014, 2:20 PM

## 2014-12-09 NOTE — Progress Notes (Signed)
Subjective:   Having a lot of pain in leg. HD going well.   Objective Filed Vitals:   12/09/14 0545 12/09/14 0546 12/09/14 0821 12/09/14 0830  BP: 147/66  137/66 145/68  Pulse: 79  77 78  Temp: 98.9 F (37.2 C)  98.5 F (36.9 C)   TempSrc: Oral  Oral   Resp: Height:      Weight:  73.211 kg (161 lb 6.4 oz) 69.1 kg (152 lb 5.4 oz)   SpO2: 97%      Physical Exam General: alert and oriented. On HD. No acute distress.  Heart: RRR 2/6 systolic Lungs: CTA, unlabored.  Abdomen: soft, nontender +BS  Extremities: L AKA/ R leg erythema. edema and warmth Dialysis Access:  L AVF patent on HD  Dialysis Orders: Center: [place] MWF 4 hr 180 EDW 72.5 350/A 1.5 2K 2 Ca profile 4 left upper AVF heparin 5000 Mircera 75 q 4 weeks last given 3/23 venofer 100 q HD has had 2 of 5 doses, calcitriol 1.25 q HD Recent labs: Hgb 10.6 3/23 - usual range, iPTH 658 3/18 down from 1273 1/21 and 2348 05/2014  Assessment/Plan: 1. Fever/weakness likely related to right lower extremity wounds (right heel ulcer and right lateral ulcer over the 5th metatarsal) with severe celllulitis-  MRI- soft tissue ulcer/ no evidence of osteomyelitis. On vanc and cefepime. [facility] following. Cardiology has seen; huge surgical risk due to prior arrest- ECHO pending. - AKA may be her best option but will treat w antibx through the weekend- hx of prior MSSA wound culture in Jan 2016. BC 3/30 - no growth to date 2. ESRD - MWF - HD today via avf, tolerating well.  3. Hypertension/volume -uf goal 2500 today/ BP 145/68 chronic hypotension on midodrine -  has been getting +/- EDW of 72.5 with average UF of 2-3 L; net UF only 832 yesterday - needs more volume off - BP limiting maybe due to infection but will increase midodrine to 10 pre HD, use temp 36 and profile 4  4. Anemia -has been on Mircera 75 q 4 weeks last given 3/23, due 3 more doses of venofer; follow Hgb Hgb 10.3 - follow CBC- pending today 5. Metabolic bone disease  - Ca+ 9.5 allergies list vit D analogue but can take calcitriol (and previously took Hectorol last admission) /renvela 6. Nutrition - Alb 2.8 renal diet + vitamin/ nepro 7. 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 DM - per primary  Jetty Duhamel, NP [facility] Beeper [phone] 12/09/2014,9:06 AM  LOS: 2 days   Patient seen and examined, agree with above note with above modifications. Seen in HD- a lot of pain- leg does not look worse but not much better either- hgb going down- cont HD per schedule and abx and to reassess on Monday  [name], MD 12/09/2014     Additional Objective Labs: Basic Metabolic Panel:  Recent Labs Lab 12/07/14 1143 12/08/14 0630 12/09/14 0520  NA 134* 134* 135  K 4.2 3.8 4.3  CL 94* 96 96  CO2 GLUCOSE 217* 207* 144*  BUN 50* 26* 45*  CREATININE 6.69* 4.05* 5.46*  CALCIUM 10.1 9.2 9.5   Liver Function Tests:  Recent Labs Lab 12/07/14 1143 12/09/14 0520  AST 25 46*  ALT 20 25  ALKPHOS 118* 135*  BILITOT 1.3* 1.5*  PROT 7.0 6.9  ALBUMIN 3.3* 2.8*  No results for input(s): LIPASE, AMYLASE in the last 168 hours. CBC:  Recent Labs Lab 12/07/14 1143 12/08/14 0630  WBC 16.6* 13.4*  NEUTROABS 15.1*  --   HGB 11.3* 10.3*  HCT 34.1* 30.9*  MCV 96.1 95.7  PLT 225 202   Blood Culture    Component Value Date/Time   SDES BLOOD RIGHT FOREARM 12/07/2014 1143   SPECREQUEST BOTTLES DRAWN AEROBIC AND ANAEROBIC 5CC 12/07/2014 1143   CULT  12/07/2014 1143           BLOOD CULTURE RECEIVED NO GROWTH TO DATE CULTURE WILL BE HELD FOR 5 DAYS BEFORE ISSUING A FINAL NEGATIVE REPORT Performed at [facility]    REPTSTATUS PENDING 12/07/2014 1143    Cardiac Enzymes: No results for input(s): CKTOTAL, CKMB, CKMBINDEX, TROPONINI in the last 168 hours. CBG:  Recent Labs Lab 12/07/14 2053 12/08/14 0759 12/08/14 1233 12/08/14 1735 12/08/14 2221  GLUCAP 86 171*  175* 133* 164*   Iron Studies: No results for input(s): IRON, TIBC, TRANSFERRIN, FERRITIN in the last 72 hours. @ Studies/Results: Mr Foot Right Wo Contrast  12/08/2014   CLINICAL DATA:  Peripheral vascular disease. Ulceration along the lateral aspect of the heel.  EXAM: MRI OF THE RIGHT FOREFOOT WITHOUT CONTRAST  TECHNIQUE: Multiplanar, multisequence MR imaging was performed. No intravenous contrast was administered.  COMPARISON:  None.  FINDINGS: Peroneal: Peroneal longus tendon intact. Peroneal brevis intact.  Posteromedial: Posterior tibial tendon intact. Flexor hallucis longus tendon intact. Flexor digitorum longus tendon intact.  Anterior: Tibialis anterior tendon intact. Extensor hallucis longus tendon intact Extensor digitorum longus tendon intact.  Achilles: Intact.  Plantar Fascia: Intact.  LIGAMENTS  Medial: Deltoid ligament intact. Spring ligament intact.  Lateral: Anterior talofibular ligament intact. Calcaneofibular ligament intact. Posterior talofibular ligament intact. Anterior and posterior tibiofibular ligaments intact.  Ankle Joint: No joint effusion. No dislocation.  No chondral defect.  Subtalar Joint/Sinus Tarsi: No joint effusion. Normal subtalar joints. Normal sinus tarsi.  Bone/Soft tissue:  There is a soft tissue ulcer along the lateral aspect of the hindfoot. There is no adjacent focal fluid collection to suggest an abscess. There is soft tissue edema throughout the right foot and ankle.  There is no focal marrow signal abnormality within the calcaneus. There is no bone destruction or periosteal reaction.  There is a transverse linear signal abnormality involving the fifth metatarsal neck without surrounding marrow edema likely reflecting a healed or chronic ununited fracture. There is no other marrow signal abnormality. 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  IMPRESSION: 1. Soft tissue ulcer along the lateral aspect of the hindfoot without a focal  fluid collection to suggest an abscess. Soft tissue edema throughout the right foot and ankle which is nonspecific and may reflect reactive edema versus cellulitis. 2. No evidence of osteomyelitis of the right ankle.   Electronically Signed   By: Elige Ko   On: 12/08/2014 12:40   Medications:   . aspirin  81 mg Oral Daily  . atropine  1 application Left Eye Q1200  . calcitRIOL  1.25 mcg Oral Q M,W,F-HD  . ceFEPime (MAXIPIME) IV  2 g Intravenous Q M,W,F-HD  . collagenase  1 application Topical Once per day on Sun Tue Thu Sat  . docusate sodium  100 mg Oral BID  . famotidine  20 mg Oral QHS  . feeding supplement (PRO-STAT SUGAR FREE 64)  30 mL Oral TID  . heparin  5,000 Units Subcutaneous 3 times per day  . insulin aspart  0-5 Units Subcutaneous QHS  . insulin aspart  0-9 Units Subcutaneous TID WC  . metoCLOPramide  10 mg Oral TID AC & HS  . metoprolol tartrate  12.5 mg Oral Q M,W,F-HD  . midodrine      . midodrine      . midodrine  10 mg Oral Q M,W,F-HD  . midodrine  5 mg Oral BID WC  . multivitamin  1 tablet Oral QHS  . neomycin-bacitracin-polymyxin  1 application Topical Daily  . polyethylene glycol  17 g Oral Daily  . sevelamer carbonate  800 mg Oral TID WC  . sodium chloride  3 mL Intravenous Q12H  . vancomycin  750 mg Intravenous Q M,W,F-HD

## 2014-12-09 NOTE — Progress Notes (Signed)
Called [facility] was forwarded to triage nursing triage hotline and notified MD Hart RochesterLawson was not in today. I left a message detailing in regards to having MD Hart RochesterLawson do an inpatient follow up with the patient per MD Abrols request.

## 2014-12-09 NOTE — Progress Notes (Signed)
Spoke with Baldomero Lamyorey Gatling transporting patient to HD, he stated he would be giving the patient her morning dose of midodrine in HD.

## 2014-12-09 NOTE — Progress Notes (Signed)
Patient Name: Briana Gould Date of Encounter: 12/09/2014  Principal Problem:   Cellulitis Active Problems:   PAD (peripheral artery disease)   Diabetes mellitus with end stage renal disease   ESRD (end stage renal disease) on dialysis   CAD- severe 3V CAD at cath March 2015- medical Rx   CVA (cerebral infarction)   Leukocytosis   Primary Cardiologist: Dr Excell Seltzer  Patient Profile: 69 y.o. female with a PMHx of ESRD on HD, severe PVD, HTN, PAH, CAD, mild AS, CVA, history of GI bleed, L BKA, admitted 12/07/2014 for cellulitis and ischemic R leg. Cards seeing for preop eval.  SUBJECTIVE: Denies chest pain or SOB, leg feels better.   OBJECTIVE Filed Vitals:   12/07/14 2057 12/08/14 0522 12/09/14 0545 12/09/14 0546  BP: 125/55 132/59 147/66   Pulse: 81 81 79   Temp: 98.9 F (37.2 C) 99.8 F (37.7 C) 98.9 F (37.2 C)   TempSrc: Oral Oral Oral   Resp: Height:      Weight:    161 lb 6.4 oz (73.211 kg)  SpO2: 100% 98% 97%     Intake/Output Summary (Last 24 hours) at 12/09/14 0757 Last data filed at 12/08/14 2157  Gross per 24 hour  Intake    720 ml  Output      0 ml  Net    720 ml   Filed Weights   12/07/14 1618 12/07/14 2024 12/09/14 0546  Weight: 165 lb 5.5 oz (75 kg) 164 lb 0.4 oz (74.4 kg) 161 lb 6.4 oz (73.211 kg)    PHYSICAL EXAM General: Well developed, well nourished, female in no acute distress. Head: Normocephalic, atraumatic.  Neck: Supple without bruits, JVD mod elevated. Lungs:  Resp regular and unlabored, rales bases. Heart: RRR, S1, S2, no S3, S4, 2/6 murmur; no rub. Abdomen: Soft, non-tender, non-distended, BS + x 4.  Extremities: No clubbing, cyanosis, RLE wounds not draining, leg is extremely tender to palpation. + edema Neuro: Alert and oriented X 3. Moves all extremities spontaneously. Psych: Normal affect.  LABS: CBC:  Recent Labs  12/07/14 1143 12/08/14 0630  WBC 16.6* 13.4*  NEUTROABS 15.1*  --   HGB 11.3* 10.3*  HCT  34.1* 30.9*  MCV 96.1 95.7  PLT 225 202   Basic Metabolic Panel:  Recent Labs  95/62/13 1143 12/08/14 0630  NA 134* 134*  K 4.2 3.8  CL 94* 96  CO2 23 26  GLUCOSE 217* 207*  BUN 50* 26*  CREATININE 6.69* 4.05*  CALCIUM 10.1 9.2   Liver Function Tests:  Recent Labs  12/07/14 1143  AST 25  ALT 20  ALKPHOS 118*  BILITOT 1.3*  PROT 7.0  ALBUMIN 3.3*   Radiology/Studies: Mr Foot Right Wo Contrast 12/08/2014   CLINICAL DATA:  Peripheral vascular disease. Ulceration along the lateral aspect of the heel.  EXAM: MRI OF THE RIGHT FOREFOOT WITHOUT CONTRAST   IMPRESSION: 1. Soft tissue ulcer along the lateral aspect of the hindfoot without a focal fluid collection to suggest an abscess. Soft tissue edema throughout the right foot and ankle which is nonspecific and may reflect reactive edema versus cellulitis. 2. No evidence of osteomyelitis of the right ankle.   Electronically Signed   By: Elige Ko   On: 12/08/2014 12:40   Current Medications:  . aspirin  81 mg Oral Daily  . atropine  1 application Left Eye Q1200  . calcitRIOL  1.25 mcg Oral Q M,W,F-HD  .  ceFEPime (MAXIPIME) IV  2 g Intravenous Q M,W,F-HD  . collagenase  1 application Topical Once per day on Sun Tue Thu Sat  . docusate sodium  100 mg Oral BID  . famotidine  20 mg Oral QHS  . feeding supplement (PRO-STAT SUGAR FREE 64)  30 mL Oral TID  . heparin  5,000 Units Subcutaneous 3 times per day  . insulin aspart  0-5 Units Subcutaneous QHS  . insulin aspart  0-9 Units Subcutaneous TID WC  . metoCLOPramide  10 mg Oral TID AC & HS  . metoprolol tartrate  12.5 mg Oral Q M,W,F-HD  . midodrine  10 mg Oral Q M,W,F-HD  . midodrine  5 mg Oral BID WC  . multivitamin  1 tablet Oral QHS  . neomycin-bacitracin-polymyxin  1 application Topical Daily  . polyethylene glycol  17 g Oral Daily  . sevelamer carbonate  800 mg Oral TID WC  . sodium chloride  3 mL Intravenous Q12H  . vancomycin  750 mg Intravenous Q M,W,F-HD       ASSESSMENT AND PLAN: 1. Preoperative evaluation: The patient has had cardiac arrest with procedures over the past several years. She is at extremely high risk of having another cardiac arrest. She has known severe diffuse coronary artery disease and was deemed not a candidate for further revascularization by Dr. Excell Seltzerooper.  Her left ventricle systolic function has been well preserved. Her last echocardiogram in October revealed an ejection fraction of 45%. Repeat echo ordered, not done yet.   2. Coronary artery disease: She has very severe coronary artery disease and is not a candidate for further coronary interventions. MV would not change that, continue med rx, no further workup. She is on ASA, BB, no ongoing ischemic symptoms  3. Peripheral vascular disease: Followed by [facility].  4. End-stage renal disease: On hemodialysis  Otherwise, per IM and consultants Principal Problem:   Cellulitis - Dr Hayden PedroLawon recommended aggressive ABX and then Dr Imogene Burnhen to decide next week on angiogram  Active Problems:   PAD (peripheral artery disease)   Diabetes mellitus with end stage renal disease   ESRD (end stage renal disease) on dialysis   CAD- severe 3V CAD at cath March 2015- medical Rx   CVA (cerebral infarction)   Leukocytosis   Signed, Theodore DemarkRhonda Barrett , PA-C 7:57 AM 12/09/2014 Patient seen and examined and history reviewed. Agree with above findings and plan. Patient complains of a lot of leg pain. She is at high risk for surgery given prior history. Understands that if antibiotics do not control cellulitis then the only option is amputation. She tells me she will not have an amputation. She is currently stable from a cardiac standpoint and is on appropriate meds. There is nothing else for us to add at this point and we will sign off. Please call if needed.   Peter SwazilandJordan, MDFACC 12/09/2014 10:23 AM

## 2014-12-09 NOTE — Progress Notes (Addendum)
  [facility] Progress Note  12/09/2014 9:21 AM  Subjective:  Doing ok. Thinks her right leg has gotten better.   Tmax 98.9 BP sys 130s-140s 02 97% RA  Filed Vitals:   12/09/14 0830  BP: 145/68  Pulse: 78  Temp:   Resp: 20    Physical Exam: Right leg cellulitis remains stable below the knee. No further progression. Wounds dressings clean bilaterally.   CBC    Component Value Date/Time   WBC 13.4* 12/08/2014 0630   RBC 3.23* 12/08/2014 0630   RBC 3.30* 09/02/2010 0650   HGB 10.3* 12/08/2014 0630   HCT 30.9* 12/08/2014 0630   PLT 202 12/08/2014 0630   MCV 95.7 12/08/2014 0630   MCH 31.9 12/08/2014 0630   MCHC 33.3 12/08/2014 0630   RDW 17.7* 12/08/2014 0630   LYMPHSABS 0.7 12/07/2014 1143   MONOABS 0.8 12/07/2014 1143   EOSABS 0.0 12/07/2014 1143   BASOSABS 0.0 12/07/2014 1143    BMET    Component Value Date/Time   NA 135 12/09/2014 0520   K 4.3 12/09/2014 0520   CL 96 12/09/2014 0520   CO2 24 12/09/2014 0520   GLUCOSE 144* 12/09/2014 0520   BUN 45* 12/09/2014 0520   CREATININE 5.46* 12/09/2014 0520   CALCIUM 9.5 12/09/2014 0520   GFRNONAA 7* 12/09/2014 0520   GFRAA 8* 12/09/2014 0520    INR    Component Value Date/Time   INR 1.18 06/27/2014 2032     Intake/Output Summary (Last 24 hours) at 12/09/14 0921 Last data filed at 12/08/14 2157  Gross per 24 hour  Intake    720 ml  Output      0 ml  Net    720 ml     Assessment:  69 y.o. female with bilateral lower extremity critical limb ischemia  Plan: -Right leg cellulitis remains stable. Will continue aggressive IV antibiotics. If her cellulitis progresses, will need amputation.  -Will continue to follow.  -Dr. Imogene Burnhen will be back next week to decide on further plan.     Maris BergerKimberly Trinh, PA-C [facility] Office: [phone] Pager: [phone] 12/09/2014 9:21 AM  Cellulitis has not progressed We'll continue to observe on IV antibiotics over the  weekend

## 2014-12-09 NOTE — Procedures (Signed)
Patient was seen on dialysis and the procedure was supervised.  BFR 350  Via AVF BP is  148/75.   Patient appears to be tolerating treatment well  Briana Gould A 12/09/2014

## 2014-12-09 NOTE — Progress Notes (Signed)
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 Hart Rochester She has ABI of 0.2 with multilevel occlusive disease.  Doubt  she will heal these ulcers and resolve this infection even with attempt at revascularization percutaneously Patient is not ready for above-knee amputation yet Continue broad-spectrum antibiotics vancomycin and cefepime over the next 3-5 days, day #2 If she has progression of cellulitis, she will need leg amputation Dr. [name] will make decision regarding whether he would like to proceed with angiography early next week hx of prior MSSA wound culture in Jan 2016. BC 3/30 - no growth to date MRI of the right lower extremity shows soft tissue ulcer  in the hindfoot Continue IV morphine when necessary, start the patient on oxycodone when necessary for better pain control   Primary hypertension/coronary artery disease/mild aortic stenosis Cardiology consultation completed Last 2-D echo October 2015 with an EF of 45-50% Extremely high risk of having another cardiac arrest Diffuse coronary artery disease and not a candidate for revascularization as per Dr. Excell Seltzer Repeat 2-D echo per cardiology, pending  ESRD - MWF - HD via AVF actually had been going reasonably well all things  considered, will plan for HD Friday   Hypertension On midodrine, metoprolol on non hemodialysis days  Anemia per nephrology Hemoglobin stable    Code Status: full Family Communication: Discussed with the patient's daughter, Briana Gould , several times today Disposition Plan:  As above    Brief narrative: This is a 69 y.o. female who we've been consulted on regarding bilateral leg wounds and cellulitis. She is accompanied by her daughter. She reports acute onset of redness and pain to her right lower leg since last night. She was also vomiting last night. She was recently seen in the office on 12/03/14 by Dr. [name] who declared her to have bilateral lower extremity critical limb ischemia. She reports a wound to her left below knee stump and two wounds on her right foot that started about six weeks ago. She has been going to the wound center for treatment. She reports some improvement in her wounds but was referred to Dr. [name] for evaluate circulation for wound healing. She has a PMH of three vessel CAD, AS, ESRD on HD, and DM II. She has also had three cardiac arrests during previous hospital admissions. Given her significant co-morbidities, she was deemed not to be a surgical candidate. Plans were made for arteriogram with bilateral runoff and intervention. According to her daughter, the patient was to have an MRI to evaluate for osteomyelitis of her right foot.  She's not had any episodes of chest pain. Her breathing is stable. She's had a slow gradual decline in health. She's had difficulty with dialysis for the past couple days. They've not been able to pull as much fluid as they would like.  She's not had a  stress Myoview study in a long time.  She had a she's had progressive cellulitis of her right lower leg for the past several weeks. She's been seen by Dr. [name] for possible revascularization .   Her past 3 surgical procedures have been complicated by cardiac arrest and  prolonged hospitalizations. She's been deemed not a surgical candidate for open revascularization. She may be a candidate for amputation.   Consultants:    Cardiology  Vascular surgery  Nephrology    Procedures:  Hemodialysis  Antibiotics: Vancomycin cefepime  HPI/Subjective: As per my assessment the patient's right leg looks the same, she is complaining of excruciating pain, Patient seen during hemodialysis Received one dose of Tylenol, requested RN to give another dose of morphine  Objective: Filed Vitals:   12/09/14 0900 12/09/14 0930 12/09/14 1000 12/09/14 1030  BP: 154/81 148/75 147/86 162/96  Pulse: 80 80 91 84  Temp:      TempSrc:      Resp: Height:      Weight:      SpO2:        Intake/Output Summary (Last 24 hours) at 12/09/14 1046 Last data filed at 12/08/14 2157  Gross per 24 hour  Intake    480 ml  Output      0 ml  Net    480 ml    Exam:  General: No acute respiratory distress Lungs: Clear to auscultation bilaterally without wheezes or crackles Cardiovascular: Regular rate and rhythm without murmur gallop or rub normal S1 and S2 Abdomen: Nontender, nondistended, soft, bowel sounds positive, no rebound, no ascites, no appreciable mass Extremities: cellulitis of right lower extremity from below knee down to foot with blisters on upper thigh. Necrotic right heel wound. Full thickness wound with exposed bone to right lateral foot. Left below knee stump with ulceration at distal stump.      Data Reviewed: Basic Metabolic Panel:  Recent Labs Lab 12/07/14 1143 12/08/14 0630 12/09/14 0520  NA 134* 134* 135  K 4.2 3.8 4.3  CL 94* 96 96  CO2 GLUCOSE 217* 207* 144*  BUN 50* 26* 45*  CREATININE 6.69* 4.05* 5.46*  CALCIUM 10.1 9.2 9.5    Liver Function Tests:  Recent Labs Lab 12/07/14 1143 12/09/14 0520  AST 25 46*  ALT 20 25  ALKPHOS 118* 135*  BILITOT 1.3* 1.5*  PROT 7.0 6.9  ALBUMIN 3.3* 2.8*   No  results for input(s): LIPASE, AMYLASE in the last 168 hours. No results for input(s): AMMONIA in the last 168 hours.  CBC:  Recent Labs Lab 12/07/14 1143 12/08/14 0630  WBC 16.6* 13.4*  NEUTROABS 15.1*  --   HGB 11.3* 10.3*  HCT 34.1* 30.9*  MCV 96.1 95.7  PLT 225 202    Cardiac Enzymes: No results for input(s): CKTOTAL, CKMB, CKMBINDEX, TROPONINI in the last 168 hours. BNP (last 3 results) No results for input(s): BNP in the last 8760 hours.  ProBNP (last 3 results) No results for input(s): PROBNP in the last 8760 hours.    CBG:  Recent Labs Lab 12/07/14 2053 12/08/14 0759 12/08/14 1233 12/08/14 1735 12/08/14 2221  GLUCAP 86 171* 175* 133* 164*    Recent Results (from the past 240 hour(s))  Culture, blood (routine x 2)     Status: None (Preliminary result)   Collection Time: 12/07/14 11:31 AM  Result Value Ref Range Status   Specimen Description BLOOD HAND RIGHT  Final   Special  Requests BOTTLES DRAWN AEROBIC AND ANAEROBIC 5CC  Final   Culture   Final           BLOOD CULTURE RECEIVED NO GROWTH TO DATE CULTURE WILL BE HELD FOR 5 DAYS BEFORE ISSUING A FINAL NEGATIVE REPORT Performed at [facility]    Report Status PENDING  Incomplete  Culture, blood (routine x 2)     Status: None (Preliminary result)   Collection Time: 12/07/14 11:43 AM  Result Value Ref Range Status   Specimen Description BLOOD RIGHT FOREARM  Final   Special Requests BOTTLES DRAWN AEROBIC AND ANAEROBIC 5CC  Final   Culture   Final           BLOOD CULTURE RECEIVED NO GROWTH TO DATE CULTURE WILL BE HELD FOR 5 DAYS BEFORE ISSUING A FINAL NEGATIVE REPORT Performed at [facility]    Report Status PENDING  Incomplete     Studies: Mr Foot Right Wo Contrast  12/08/2014   CLINICAL DATA:  Peripheral vascular disease. Ulceration along the lateral aspect of the heel.  EXAM: MRI OF THE RIGHT FOREFOOT WITHOUT CONTRAST  TECHNIQUE: Multiplanar, multisequence MR imaging was performed.  No intravenous contrast was administered.  COMPARISON:  None.  FINDINGS: Peroneal: Peroneal longus tendon intact. Peroneal brevis intact.  Posteromedial: Posterior tibial tendon intact. Flexor hallucis longus tendon intact. Flexor digitorum longus tendon intact.  Anterior: Tibialis anterior tendon intact. Extensor hallucis longus tendon intact Extensor digitorum longus tendon intact.  Achilles: Intact.  Plantar Fascia: Intact.  LIGAMENTS  Medial: Deltoid ligament intact. Spring ligament intact.  Lateral: Anterior talofibular ligament intact. Calcaneofibular ligament intact. Posterior talofibular ligament intact. Anterior and posterior tibiofibular ligaments intact.  Ankle Joint: No joint effusion. No dislocation.  No chondral defect.  Subtalar Joint/Sinus Tarsi: No joint effusion. Normal subtalar joints. Normal sinus tarsi.  Bone/Soft tissue:  There is a soft tissue ulcer along the lateral aspect of the hindfoot. There is no adjacent focal fluid collection to suggest an abscess. There is soft tissue edema throughout the right foot and ankle.  There is no focal marrow signal abnormality within the calcaneus. There is no bone destruction or periosteal reaction.  There is a transverse linear signal abnormality involving the fifth metatarsal neck without surrounding marrow edema likely reflecting a healed or chronic ununited fracture. There is no other marrow signal abnormality. 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  IMPRESSION: 1. Soft tissue ulcer along the lateral aspect of the hindfoot without a focal fluid collection to suggest an abscess. Soft tissue edema throughout the right foot and ankle which is nonspecific and may reflect reactive edema versus cellulitis. 2. No evidence of osteomyelitis of the right ankle.   Electronically Signed   By: Elige Ko   On: 12/08/2014 12:40   Dg Knee Complete 4 Views Left  11/17/2014   CLINICAL DATA:  Open wound.  Ulceration.  EXAM: LEFT KNEE -  COMPLETE 4+ VIEW  COMPARISON:  None.  FINDINGS: Surgical clips noted over the left lower extremity. Patient has had prior per lobe knee amputation. Peripheral vascular calcifications present. No evidence of effusion. Ulceration noted of the distal stomach. No acute or underlying bony abnormality.  IMPRESSION: 1. Left BKA. Ulceration of the soft tissues of the distal stump. No underlying bony abnormality identified. 2. Peripheral vascular disease.   Electronically Signed   By: Maisie Fus  Register   On: 11/17/2014 13:47   Dg Foot Complete Right  11/17/2014   CLINICAL DATA:  Right foot ulcer  laterally  EXAM: RIGHT FOOT COMPLETE - 3+ VIEW  COMPARISON:  None.  FINDINGS: Leandra KernLucency is noted within the lateral soft tissues consistent with the patient's given clinical history. No underlying bony destruction is identified to suggest osteomyelitis at this time. Mild osteopenia is seen. Diffuse vascular calcifications are noted.  IMPRESSION: Soft tissue wound without definitive bony abnormality.   Electronically Signed   By: Alcide CleverMark  Lukens M.D.   On: 11/17/2014 13:47    Scheduled Meds: . aspirin  81 mg Oral Daily  . atropine  1 application Left Eye Q1200  . calcitRIOL  1.25 mcg Oral Q M,W,F-HD  . ceFEPime (MAXIPIME) IV  2 g Intravenous Q M,W,F-HD  . collagenase  1 application Topical Once per day on Sun Tue Thu Sat  . docusate sodium  100 mg Oral BID  . famotidine  20 mg Oral QHS  . feeding supplement (PRO-STAT SUGAR FREE 64)  30 mL Oral TID  . heparin  5,000 Units Subcutaneous 3 times per day  . insulin aspart  0-5 Units Subcutaneous QHS  . insulin aspart  0-9 Units Subcutaneous TID WC  . metoCLOPramide  10 mg Oral TID AC & HS  . metoprolol tartrate  12.5 mg Oral Q M,W,F-HD  . midodrine  10 mg Oral Q M,W,F-HD  . midodrine  5 mg Oral BID WC  . morphine      . multivitamin  1 tablet Oral QHS  . neomycin-bacitracin-polymyxin  1 application Topical Daily  . polyethylene glycol  17 g Oral Daily  . sevelamer  carbonate  800 mg Oral TID WC  . sodium chloride  3 mL Intravenous Q12H  . 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 If 7PM-7AM, please contact night-coverage at [email or web address], password [email or web address] 12/09/2014, 10:46 AM  LOS: 2 days

## 2014-12-10 LAB — GLUCOSE, CAPILLARY
GLUCOSE-CAPILLARY: 210 mg/dL — AB (ref 70–99)
Glucose-Capillary: 102 mg/dL — ABNORMAL HIGH (ref 70–99)
Glucose-Capillary: 253 mg/dL — ABNORMAL HIGH (ref 70–99)
Glucose-Capillary: 122 mg/dL — ABNORMAL HIGH (ref 70–99)

## 2014-12-10 NOTE — Progress Notes (Signed)
[facility] PROGRESS NOTE  Briana Gould QIO:[id number] DOB: [birth year] DOA: 12/07/2014 PCP: Laurena SlimmerLARK,PRESTON S, MD  Assessment/Plan: Principal Problem:   Cellulitis Active Problems:   Diabetes mellitus with end stage renal disease   ESRD (end stage renal disease) on dialysis   PAD (peripheral artery disease)   CAD- severe 3V CAD at cath March 2015- medical Rx   CVA (cerebral infarction)   Leukocytosis   Severe peripheral vascular disease bilateral lower extremity critical limb ischemia/ulceration of the left below-knee stump  manifested by right necrotic heel wound and right lateral full thickness foot wound with exposed bone, cellulitis of right lower leg  Followed by vascular surgery not a candidate for open surgical bypass per Dr. Hart RochesterLawson She has ABI of 0.2 with multilevel occlusive disease. Doubt  she will heal these ulcers and resolve this infection even with attempt at revascularization percutaneously Patient is not ready for above-knee amputation yet Continue broad-spectrum antibiotics vancomycin and cefepime over the next 3-5 days, day #3 If she has progression of cellulitis, she will need leg amputation Dr. Imogene Burnhen will make decision regarding whether he would like to proceed with angiography early next week hx of prior MSSA wound culture in Jan 2016. BC 3/30 - no growth to date MRI of the right lower extremity shows soft tissue ulcer  in the hindfoot Continue IV morphine when necessary, patient wants to minimize narcotic medications because of confusion Slight improvement in the patient's erythema, white blood cell count is rising CBC pending from today   Primary hypertension/coronary artery disease/mild aortic stenosis Cardiology consultation completed Last 2-D echo October 2015 with an EF of 45-50% Extremely high risk of having another cardiac arrest Diffuse coronary artery disease and not a candidate for revascularization as per Dr. Excell Seltzerooper Repeat 2-D echo per  cardiology, shows EF of 40-45%, essentially unchanged Continue beta blocker   ESRD - MWF - HD via AVF actually had been going reasonably well all things considered, will plan for HD next Monday   Hypertension On midodrine, metoprolol on non hemodialysis days  Anemia per nephrology Hemoglobin stable    Code Status: full Family Communication: Discussed with the patient's daughter, Linward NatalValdes Brooks,Dana , several times today Disposition Plan:  As above    Brief narrative: This is a 69 y.o. female who we've been consulted on regarding bilateral leg wounds and cellulitis. She is accompanied by her daughter. She reports acute onset of redness and pain to her right lower leg since last night. She was also vomiting last night. She was recently seen in the office on 12/03/14 by Dr. Imogene Burnhen who declared her to have bilateral lower extremity critical limb ischemia. She reports a wound to her left below knee stump and two wounds on her right foot that started about six weeks ago. She has been going to the wound center for treatment. She reports some improvement in her wounds but was referred to Dr. Imogene Burnhen for evaluate circulation for wound healing. She has a PMH of three vessel CAD, AS, ESRD on HD, and DM II. She has also had three cardiac arrests during previous hospital admissions. Given her significant co-morbidities, she was deemed not to be a surgical candidate. Plans were made for arteriogram with bilateral runoff and intervention. According to her daughter, the patient was to have an MRI to evaluate for osteomyelitis of her right foot.  She's not had any episodes of chest pain. Her breathing is stable. She's had a slow gradual decline in health. She's had difficulty with dialysis  for the past couple days. They've not been able to pull as much fluid as they would like.  She's not had a stress Myoview study in a long time.  She had a she's had progressive cellulitis of her right lower leg for the past  several weeks. She's been seen by Dr. [name] for possible revascularization .   Her past 3 surgical procedures have been complicated by cardiac arrest and prolonged hospitalizations. She's been deemed not a surgical candidate for open revascularization. She may be a candidate for amputation.   Consultants:    Cardiology  Vascular surgery  Nephrology    Procedures:  Hemodialysis  Antibiotics: Vancomycin cefepime  HPI/Subjective:  Patient states that the pain is improving She wants to minimize narcotic medications that they make her confused  Objective: Filed Vitals:   12/09/14 1100 12/09/14 1225 12/09/14 2115 12/10/14 0511  BP: 152/73 156/74 128/70 148/62  Pulse: 79 82 80 79  Temp:   99.2 F (37.3 C) 98.8 F (37.1 C)  TempSrc:   Oral Oral  Resp: Height:      Weight:  67.2 kg (148 lb 2.4 oz)  67 kg (147 lb 11.3 oz)  SpO2:  99% 99% 95%    Intake/Output Summary (Last 24 hours) at 12/10/14 1008 Last data filed at 12/10/14 4098  Gross per 24 hour  Intake    520 ml  Output   1963 ml  Net  -1443 ml    Exam:  General: No acute respiratory distress Lungs: Clear to auscultation bilaterally without wheezes or crackles Cardiovascular: Regular rate and rhythm without murmur gallop or rub normal S1 and S2 Abdomen: Nontender, nondistended, soft, bowel sounds positive, no rebound, no ascites, no appreciable mass Extremities: cellulitis of right lower extremity from below knee down to foot with blisters on upper thigh. Slight improvement in erythema. Necrotic right heel wound. Full thickness wound with exposed bone to right lateral foot. Left below knee stump with ulceration at distal stump.      Data Reviewed: Basic Metabolic Panel:  Recent Labs Lab 12/07/14 1143 12/08/14 0630 12/09/14 0520  NA 134* 134* 135  K 4.2 3.8 4.3  CL 94* 96 96  CO2 GLUCOSE 217* 207* 144*  BUN 50* 26* 45*  CREATININE 6.69* 4.05* 5.46*  CALCIUM 10.1 9.2 9.5     Liver Function Tests:  Recent Labs Lab 12/07/14 1143 12/09/14 0520  AST 25 46*  ALT 20 25  ALKPHOS 118* 135*  BILITOT 1.3* 1.5*  PROT 7.0 6.9  ALBUMIN 3.3* 2.8*   No results for input(s): LIPASE, AMYLASE in the last 168 hours. No results for input(s): AMMONIA in the last 168 hours.  CBC:  Recent Labs Lab 12/07/14 1143 12/08/14 0630 12/09/14 1500  WBC 16.6* 13.4* 18.4*  NEUTROABS 15.1*  --   --   HGB 11.3* 10.3* 11.6*  HCT 34.1* 30.9* 35.0*  MCV 96.1 95.7 99.4  PLT 225 202 213    Cardiac Enzymes: No results for input(s): CKTOTAL, CKMB, CKMBINDEX, TROPONINI in the last 168 hours. BNP (last 3 results) No results for input(s): BNP in the last 8760 hours.  ProBNP (last 3 results) No results for input(s): PROBNP in the last 8760 hours.    CBG:  Recent Labs Lab 12/08/14 2221 12/09/14 1354 12/09/14 1751 12/09/14 2110 12/10/14 0800  GLUCAP 164* 94 131* 116* 102*    Recent Results (from the past 240 hour(s))  Culture, blood (routine x 2)  Status: None (Preliminary result)   Collection Time: 12/07/14 11:31 AM  Result Value Ref Range Status   Specimen Description BLOOD HAND RIGHT  Final   Special Requests BOTTLES DRAWN AEROBIC AND ANAEROBIC 5CC  Final   Culture   Final           BLOOD CULTURE RECEIVED NO GROWTH TO DATE CULTURE WILL BE HELD FOR 5 DAYS BEFORE ISSUING A FINAL NEGATIVE REPORT Performed at [facility]    Report Status PENDING  Incomplete  Culture, blood (routine x 2)     Status: None (Preliminary result)   Collection Time: 12/07/14 11:43 AM  Result Value Ref Range Status   Specimen Description BLOOD RIGHT FOREARM  Final   Special Requests BOTTLES DRAWN AEROBIC AND ANAEROBIC 5CC  Final   Culture   Final           BLOOD CULTURE RECEIVED NO GROWTH TO DATE CULTURE WILL BE HELD FOR 5 DAYS BEFORE ISSUING A FINAL NEGATIVE REPORT Performed at [facility]    Report Status PENDING  Incomplete     Studies: Mr Foot Right Wo  Contrast  12/08/2014   CLINICAL DATA:  Peripheral vascular disease. Ulceration along the lateral aspect of the heel.  EXAM: MRI OF THE RIGHT FOREFOOT WITHOUT CONTRAST  TECHNIQUE: Multiplanar, multisequence MR imaging was performed. No intravenous contrast was administered.  COMPARISON:  None.  FINDINGS: Peroneal: Peroneal longus tendon intact. Peroneal brevis intact.  Posteromedial: Posterior tibial tendon intact. Flexor hallucis longus tendon intact. Flexor digitorum longus tendon intact.  Anterior: Tibialis anterior tendon intact. Extensor hallucis longus tendon intact Extensor digitorum longus tendon intact.  Achilles: Intact.  Plantar Fascia: Intact.  LIGAMENTS  Medial: Deltoid ligament intact. Spring ligament intact.  Lateral: Anterior talofibular ligament intact. Calcaneofibular ligament intact. Posterior talofibular ligament intact. Anterior and posterior tibiofibular ligaments intact.  Ankle Joint: No joint effusion. No dislocation.  No chondral defect.  Subtalar Joint/Sinus Tarsi: No joint effusion. Normal subtalar joints. Normal sinus tarsi.  Bone/Soft tissue:  There is a soft tissue ulcer along the lateral aspect of the hindfoot. There is no adjacent focal fluid collection to suggest an abscess. There is soft tissue edema throughout the right foot and ankle.  There is no focal marrow signal abnormality within the calcaneus. There is no bone destruction or periosteal reaction.  There is a transverse linear signal abnormality involving the fifth metatarsal neck without surrounding marrow edema likely reflecting a healed or chronic ununited fracture. There is no other marrow signal abnormality. 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  IMPRESSION: 1. Soft tissue ulcer along the lateral aspect of the hindfoot without a focal fluid collection to suggest an abscess. Soft tissue edema throughout the right foot and ankle which is nonspecific and may reflect reactive edema versus cellulitis.  2. No evidence of osteomyelitis of the right ankle.   Electronically Signed   By: Elige Ko   On: 12/08/2014 12:40   Dg Knee Complete 4 Views Left  11/17/2014   CLINICAL DATA:  Open wound.  Ulceration.  EXAM: LEFT KNEE - COMPLETE 4+ VIEW  COMPARISON:  None.  FINDINGS: Surgical clips noted over the left lower extremity. Patient has had prior per lobe knee amputation. Peripheral vascular calcifications present. No evidence of effusion. Ulceration noted of the distal stomach. No acute or underlying bony abnormality.  IMPRESSION: 1. Left BKA. Ulceration of the soft tissues of the distal stump. No underlying bony abnormality identified. 2. Peripheral vascular disease.  Electronically Signed   By: Maisie Fus  Register   On: 11/17/2014 13:47   Dg Foot Complete Right  11/17/2014   CLINICAL DATA:  Right foot ulcer laterally  EXAM: RIGHT FOOT COMPLETE - 3+ VIEW  COMPARISON:  None.  FINDINGS: Leandra Kern is noted within the lateral soft tissues consistent with the patient's given clinical history. No underlying bony destruction is identified to suggest osteomyelitis at this time. Mild osteopenia is seen. Diffuse vascular calcifications are noted.  IMPRESSION: Soft tissue wound without definitive bony abnormality.   Electronically Signed   By: Alcide Clever M.D.   On: 11/17/2014 13:47    Scheduled Meds: . aspirin  81 mg Oral Daily  . atropine  1 application Left Eye Q1200  . calcitRIOL  1.25 mcg Oral Q M,W,F-HD  . ceFEPime (MAXIPIME) IV  2 g Intravenous Q M,W,F-HD  . collagenase  1 application Topical Once per day on Sun Tue Thu Sat  . docusate sodium  100 mg Oral BID  . famotidine  20 mg Oral QHS  . feeding supplement (PRO-STAT SUGAR FREE 64)  30 mL Oral TID  . heparin  5,000 Units Subcutaneous 3 times per day  . insulin aspart  0-5 Units Subcutaneous QHS  . insulin aspart  0-9 Units Subcutaneous TID WC  . metoCLOPramide  10 mg Oral TID AC & HS  . metoprolol tartrate  12.5 mg Oral Q M,W,F-HD  . midodrine  10  mg Oral Q M,W,F-HD  . midodrine  5 mg Oral BID WC  . multivitamin  1 tablet Oral QHS  . neomycin-bacitracin-polymyxin  1 application Topical Daily  . polyethylene glycol  17 g Oral Daily  . sevelamer carbonate  800 mg Oral TID WC  . sodium chloride  3 mL Intravenous Q12H  . 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 If 7PM-7AM, please contact night-coverage at [email or web address], password [facility] 12/10/2014, 10:08 AM  LOS: 3 days

## 2014-12-10 NOTE — Progress Notes (Signed)
Subjective: Interval History: none.. Reports less discomfort in her  Objective: Vital signs in last 24 hours: Temp:  [98.8 F (37.1 C)-99.2 F (37.3 C)] 98.8 F (37.1 C) (04/02 0511) Pulse Rate:  [79-82] 79 (04/02 0511) Resp:  [15-18] 16 (04/02 0511) BP: (128-156)/(62-74) 148/62 mmHg (04/02 0511) SpO2:  [95 %-99 %] 95 % (04/02 0511) Weight:  [147 lb 11.3 oz (67 kg)-148 lb 2.4 oz (67.2 kg)] 147 lb 11.3 oz (67 kg) (04/02 0511)  Intake/Output from previous day: 04/01 0701 - 04/02 0700 In: 410 [P.O.:360; IV Piggyback:50] Out: 1963  Intake/Output this shift: Total I/O In: 110 [P.O.:110] Out: -   Continued cellulitis which does appear to be resolving in her calf.  Lab Results:  Recent Labs  12/08/14 0630 12/09/14 1500  WBC 13.4* 18.4*  HGB 10.3* 11.6*  HCT 30.9* 35.0*  PLT 202 213   BMET  Recent Labs  12/08/14 0630 12/09/14 0520  NA 134* 135  K 3.8 4.3  CL 96 96  CO2 26 24  GLUCOSE 207* 144*  BUN 26* 45*  CREATININE 4.05* 5.46*  CALCIUM 9.2 9.5    Studies/Results: Mr Foot Right Wo Contrast  12/08/2014   CLINICAL DATA:  Peripheral vascular disease. Ulceration along the lateral aspect of the heel.  EXAM: MRI OF THE RIGHT FOREFOOT WITHOUT CONTRAST  TECHNIQUE: Multiplanar, multisequence MR imaging was performed. No intravenous contrast was administered.  COMPARISON:  None.  FINDINGS: Peroneal: Peroneal longus tendon intact. Peroneal brevis intact.  Posteromedial: Posterior tibial tendon intact. Flexor hallucis longus tendon intact. Flexor digitorum longus tendon intact.  Anterior: Tibialis anterior tendon intact. Extensor hallucis longus tendon intact Extensor digitorum longus tendon intact.  Achilles: Intact.  Plantar Fascia: Intact.  LIGAMENTS  Medial: Deltoid ligament intact. Spring ligament intact.  Lateral: Anterior talofibular ligament intact. Calcaneofibular ligament intact. Posterior talofibular ligament intact. Anterior and posterior tibiofibular ligaments  intact.  Ankle Joint: No joint effusion. No dislocation.  No chondral defect.  Subtalar Joint/Sinus Tarsi: No joint effusion. Normal subtalar joints. Normal sinus tarsi.  Bone/Soft tissue:  There is a soft tissue ulcer along the lateral aspect of the hindfoot. There is no adjacent focal fluid collection to suggest an abscess. There is soft tissue edema throughout the right foot and ankle.  There is no focal marrow signal abnormality within the calcaneus. There is no bone destruction or periosteal reaction.  There is a transverse linear signal abnormality involving the fifth metatarsal neck without surrounding marrow edema likely reflecting a healed or chronic ununited fracture. There is no other marrow signal abnormality. 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  IMPRESSION: 1. Soft tissue ulcer along the lateral aspect of the hindfoot without a focal fluid collection to suggest an abscess. Soft tissue edema throughout the right foot and ankle which is nonspecific and may reflect reactive edema versus cellulitis. 2. No evidence of osteomyelitis of the right ankle.   Electronically Signed   By: Elige KoHetal  Patel   On: 12/08/2014 12:40   Dg Knee Complete 4 Views Left  11/17/2014   CLINICAL DATA:  Open wound.  Ulceration.  EXAM: LEFT KNEE - COMPLETE 4+ VIEW  COMPARISON:  None.  FINDINGS: Surgical clips noted over the left lower extremity. Patient has had prior per lobe knee amputation. Peripheral vascular calcifications present. No evidence of effusion. Ulceration noted of the distal stomach. No acute or underlying bony abnormality.  IMPRESSION: 1. Left BKA. Ulceration of the soft tissues of the distal stump. No underlying bony abnormality identified.  2. Peripheral vascular disease.   Electronically Signed   By: Maisie Fus  Register   On: 11/17/2014 13:47   Dg Foot Complete Right  11/17/2014   CLINICAL DATA:  Right foot ulcer laterally  EXAM: RIGHT FOOT COMPLETE - 3+ VIEW  COMPARISON:  None.  FINDINGS:  Leandra Kern is noted within the lateral soft tissues consistent with the patient's given clinical history. No underlying bony destruction is identified to suggest osteomyelitis at this time. Mild osteopenia is seen. Diffuse vascular calcifications are noted.  IMPRESSION: Soft tissue wound without definitive bony abnormality.   Electronically Signed   By: Alcide Clever M.D.   On: 11/17/2014 13:47   Anti-infectives: Anti-infectives    Start     Dose/Rate Route Frequency Ordered Stop   12/09/14 1200  vancomycin (VANCOCIN) IVPB 750 mg/150 ml premix     750 mg 150 mL/hr over 60 Minutes Intravenous Every M-W-F (Hemodialysis) 12/08/14 1001     12/09/14 1200  ceFEPIme (MAXIPIME) 2 g in dextrose 5 % 50 mL IVPB     2 g 100 mL/hr over 30 Minutes Intravenous Every M-W-F (Hemodialysis) 12/08/14 1001     12/08/14 1015  vancomycin (VANCOCIN) 1,250 mg in sodium chloride 0.9 % 250 mL IVPB     1,250 mg 166.7 mL/hr over 90 Minutes Intravenous NOW 12/08/14 1001 12/08/14 1248   12/08/14 1015  ceFEPIme (MAXIPIME) 1 g in dextrose 5 % 50 mL IVPB     1 g 100 mL/hr over 30 Minutes Intravenous NOW 12/08/14 1001 12/08/14 1118   12/07/14 1600  metroNIDAZOLE (FLAGYL) IVPB 500 mg  Status:  Discontinued     500 mg 100 mL/hr over 60 Minutes Intravenous Every 8 hours 12/07/14 1532 12/08/14 0934   12/07/14 1600  ceFEPIme (MAXIPIME) 1 g in dextrose 5 % 50 mL IVPB  Status:  Discontinued     1 g 100 mL/hr over 30 Minutes Intravenous Every 24 hours 12/07/14 1535 12/08/14 1001   12/07/14 1200  vancomycin (VANCOCIN) IVPB 1000 mg/200 mL premix     1,000 mg 200 mL/hr over 60 Minutes Intravenous  Once 12/07/14 1145 12/07/14 1357      Assessment/Plan: s/p * No surgery found * Continue antibiotics. Actually up to 18,000. Discussed may need amputation once infection is improved   LOS: 3 days   Girolamo Lortie 12/10/2014, 10:34 AM

## 2014-12-10 NOTE — Progress Notes (Signed)
Subjective:   HD yest- removed almost 2 liters - leg looks a little less red- she is slightly confused likely due to pain medicine- trying to get comfortable Objective Filed Vitals:   12/09/14 1100 12/09/14 1225 12/09/14 2115 12/10/14 0511  BP: 152/73 156/74 128/70 148/62  Pulse: 79 82 80 79  Temp:   99.2 F (37.3 C) 98.8 F (37.1 C)  TempSrc:   Oral Oral  Resp: Height:      Weight:  67.2 kg (148 lb 2.4 oz)  67 kg (147 lb 11.3 oz)  SpO2:  99% 99% 95%   Physical Exam General: alert and oriented. On HD. No acute distress.  Heart: RRR 2/6 systolic Lungs: CTA, unlabored.  Abdomen: soft, nontender +BS  Extremities: L AKA/ R leg erythema. edema and warmth Dialysis Access:  L AVF patent on HD  Dialysis Orders: Center: [place] MWF 4 hr 180 EDW 72.5 350/A 1.5 2K 2 Ca profile 4 left upper AVF heparin 5000 Mircera 75 q 4 weeks last given 3/23 venofer 100 q HD has had 2 of 5 doses, calcitriol 1.25 q HD Recent labs: Hgb 10.6 3/23 - usual range, iPTH 658 3/18 down from 1273 1/21 and 2348 05/2014  Assessment/Plan: 1. Fever/weakness likely related to right lower extremity wounds (right heel ulcer and right lateral ulcer over the 5th metatarsal) with severe celllulitis-  MRI- soft tissue ulcer/ no evidence of osteomyelitis. On vanc and cefepime. VVS following. Cardiology has seen; huge surgical risk due to prior arrest- ECHO pending. - AKA may be her best option but will treat w antibx through the weekend- hx of prior MSSA wound culture in Jan 2016. BC 3/30 - no growth to date- looks better but WBC rising  2. ESRD - MWF - via avf, tolerating well. Plan for next on Monday 3. Hypertension/volume -BP 145/68 chronic hypotension on midodrine -  has been getting +/- EDW of 72.5 with average UF of 2-3 L; net UF only 832 yesterday - needs more volume off - BP limiting maybe due to infection but will increase midodrine to 10 pre HD, use temp 36 and profile 4  4. Anemia -has been on Mircera  75 q 4 weeks last given 3/23, due 3 more doses of venofer; follow Hgb Hgb 10.3 - follow CBC- pending today 5. Metabolic bone disease - Ca+ 9.5 allergies list vit D analogue but can take calcitriol (and previously took Hectorol last admission) /renvela 6. Nutrition - Alb 2.8 renal diet + vitamin/ nepro 7. 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 DM - per primary  Jaina Morin A   12/10/2014,8:30 AM  LOS: 3 days       Additional Objective Labs: Basic Metabolic Panel:  Recent Labs Lab 12/07/14 1143 12/08/14 0630 12/09/14 0520  NA 134* 134* 135  K 4.2 3.8 4.3  CL 94* 96 96  CO2 GLUCOSE 217* 207* 144*  BUN 50* 26* 45*  CREATININE 6.69* 4.05* 5.46*  CALCIUM 10.1 9.2 9.5   Liver Function Tests:  Recent Labs Lab 12/07/14 1143 12/09/14 0520  AST 25 46*  ALT 20 25  ALKPHOS 118* 135*  BILITOT 1.3* 1.5*  PROT 7.0 6.9  ALBUMIN 3.3* 2.8*   No results for input(s): LIPASE, AMYLASE in the last 168 hours. CBC:  Recent Labs Lab 12/07/14 1143 12/08/14 0630 12/09/14 1500  WBC 16.6* 13.4* 18.4*  NEUTROABS 15.1*  --   --  HGB 11.3* 10.3* 11.6*  HCT 34.1* 30.9* 35.0*  MCV 96.1 95.7 99.4  PLT 225 202 213   Blood Culture    Component Value Date/Time   SDES BLOOD RIGHT FOREARM 12/07/2014 1143   SPECREQUEST BOTTLES DRAWN AEROBIC AND ANAEROBIC 5CC 12/07/2014 1143   CULT  12/07/2014 1143           BLOOD CULTURE RECEIVED NO GROWTH TO DATE CULTURE WILL BE HELD FOR 5 DAYS BEFORE ISSUING A FINAL NEGATIVE REPORT Performed at [facility]    REPTSTATUS PENDING 12/07/2014 1143    Cardiac Enzymes: No results for input(s): CKTOTAL, CKMB, CKMBINDEX, TROPONINI in the last 168 hours. CBG:  Recent Labs Lab 12/08/14 2221 12/09/14 1354 12/09/14 1751 12/09/14 2110 12/10/14 0800  GLUCAP 164* 94 131* 116* 102*   Iron Studies: No results for input(s): IRON, TIBC, TRANSFERRIN, FERRITIN in the last 72  hours. @ Studies/Results: Mr Foot Right Wo Contrast  12/08/2014   CLINICAL DATA:  Peripheral vascular disease. Ulceration along the lateral aspect of the heel.  EXAM: MRI OF THE RIGHT FOREFOOT WITHOUT CONTRAST  TECHNIQUE: Multiplanar, multisequence MR imaging was performed. No intravenous contrast was administered.  COMPARISON:  None.  FINDINGS: Peroneal: Peroneal longus tendon intact. Peroneal brevis intact.  Posteromedial: Posterior tibial tendon intact. Flexor hallucis longus tendon intact. Flexor digitorum longus tendon intact.  Anterior: Tibialis anterior tendon intact. Extensor hallucis longus tendon intact Extensor digitorum longus tendon intact.  Achilles: Intact.  Plantar Fascia: Intact.  LIGAMENTS  Medial: Deltoid ligament intact. Spring ligament intact.  Lateral: Anterior talofibular ligament intact. Calcaneofibular ligament intact. Posterior talofibular ligament intact. Anterior and posterior tibiofibular ligaments intact.  Ankle Joint: No joint effusion. No dislocation.  No chondral defect.  Subtalar Joint/Sinus Tarsi: No joint effusion. Normal subtalar joints. Normal sinus tarsi.  Bone/Soft tissue:  There is a soft tissue ulcer along the lateral aspect of the hindfoot. There is no adjacent focal fluid collection to suggest an abscess. There is soft tissue edema throughout the right foot and ankle.  There is no focal marrow signal abnormality within the calcaneus. There is no bone destruction or periosteal reaction.  There is a transverse linear signal abnormality involving the fifth metatarsal neck without surrounding marrow edema likely reflecting a healed or chronic ununited fracture. There is no other marrow signal abnormality. 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  IMPRESSION: 1. Soft tissue ulcer along the lateral aspect of the hindfoot without a focal fluid collection to suggest an abscess. Soft tissue edema throughout the right foot and ankle which is  nonspecific and may reflect reactive edema versus cellulitis. 2. No evidence of osteomyelitis of the right ankle.   Electronically Signed   By: Elige Ko   On: 12/08/2014 12:40   Medications:   . aspirin  81 mg Oral Daily  . atropine  1 application Left Eye Q1200  . calcitRIOL  1.25 mcg Oral Q M,W,F-HD  . ceFEPime (MAXIPIME) IV  2 g Intravenous Q M,W,F-HD  . collagenase  1 application Topical Once per day on Sun Tue Thu Sat  . docusate sodium  100 mg Oral BID  . famotidine  20 mg Oral QHS  . feeding supplement (PRO-STAT SUGAR FREE 64)  30 mL Oral TID  . heparin  5,000 Units Subcutaneous 3 times per day  . insulin aspart  0-5 Units Subcutaneous QHS  . insulin aspart  0-9 Units Subcutaneous TID WC  . metoCLOPramide  10 mg Oral TID AC & HS  .  metoprolol tartrate  12.5 mg Oral Q M,W,F-HD  . midodrine  10 mg Oral Q M,W,F-HD  . midodrine  5 mg Oral BID WC  . multivitamin  1 tablet Oral QHS  . neomycin-bacitracin-polymyxin  1 application Topical Daily  . polyethylene glycol  17 g Oral Daily  . sevelamer carbonate  800 mg Oral TID WC  . sodium chloride  3 mL Intravenous Q12H  . vancomycin  750 mg Intravenous Q M,W,F-HD

## 2014-12-11 LAB — CBC
HCT: 33.2 % — ABNORMAL LOW (ref 36.0–46.0)
Hemoglobin: 10.9 g/dL — ABNORMAL LOW (ref 12.0–15.0)
MCH: 31.6 pg (ref 26.0–34.0)
MCHC: 32.8 g/dL (ref 30.0–36.0)
MCV: 96.2 fL (ref 78.0–100.0)
PLATELETS: 202 10*3/uL (ref 150–400)
RBC: 3.45 MIL/uL — ABNORMAL LOW (ref 3.87–5.11)
RDW: 17.4 % — ABNORMAL HIGH (ref 11.5–15.5)
WBC: 12.7 10*3/uL — ABNORMAL HIGH (ref 4.0–10.5)

## 2014-12-11 LAB — COMPREHENSIVE METABOLIC PANEL
ALT: 20 U/L (ref 0–35)
AST: 35 U/L (ref 0–37)
Albumin: 2.6 g/dL — ABNORMAL LOW (ref 3.5–5.2)
Alkaline Phosphatase: 111 U/L (ref 39–117)
Anion gap: 14 (ref 5–15)
BUN: 51 mg/dL — ABNORMAL HIGH (ref 6–23)
CALCIUM: 9.3 mg/dL (ref 8.4–10.5)
CHLORIDE: 96 mmol/L (ref 96–112)
CO2: 23 mmol/L (ref 19–32)
Creatinine, Ser: 5.21 mg/dL — ABNORMAL HIGH (ref 0.50–1.10)
GFR, EST AFRICAN AMERICAN: 9 mL/min — AB (ref >90)
GFR, EST NON AFRICAN AMERICAN: 8 mL/min — AB (ref >90)
Glucose, Bld: 139 mg/dL — ABNORMAL HIGH (ref 70–99)
Potassium: 4 mmol/L (ref 3.5–5.1)
Sodium: 133 mmol/L — ABNORMAL LOW (ref 135–145)
TOTAL PROTEIN: 6.7 g/dL (ref 6.0–8.3)
Total Bilirubin: 1.2 mg/dL (ref 0.3–1.2)

## 2014-12-11 LAB — GLUCOSE, CAPILLARY
GLUCOSE-CAPILLARY: 154 mg/dL — AB (ref 70–99)
Glucose-Capillary: 241 mg/dL — ABNORMAL HIGH (ref 70–99)
Glucose-Capillary: 217 mg/dL — ABNORMAL HIGH (ref 70–99)

## 2014-12-11 NOTE — Progress Notes (Signed)
Subjective:   Pain improving. Says morphine made her loopy yesterday. Doesn't like the food but trying to eat.  Objective Filed Vitals:   12/09/14 2115 12/10/14 0511 12/10/14 2214 12/11/14 0544  BP: 128/70 148/62 129/66 146/71  Pulse: 80 79 80 76  Temp: 99.2 F (37.3 C) 98.8 F (37.1 C) 98.6 F (37 C) 98.4 F (36.9 C)  TempSrc: Oral Oral Oral Oral  Resp: Height:      Weight:  67 kg (147 lb 11.3 oz)  67.5 kg (148 lb 13 oz)  SpO2: 99% 95% 100% 99%   Physical Exam General: alert and oriented. No acute distress. Heart:  RRR 2/6 systolic  Lungs: CTA, unlabored.  Abdomen:soft, nontender +BS Extremities:L AKA/ R leg erythema and edema improving Dialysis Access: L AVF +b/t  Dialysis Orders: Center: [place] MWF 4 hr 180 EDW 72.5 350/A 1.5 2K 2 Ca profile 4 left upper AVF heparin 5000 Mircera 75 q 4 weeks last given 3/23 venofer 100 q HD has had 2 of 5 doses, calcitriol 1.25 q HD Recent labs: Hgb 10.6 3/23 - usual range, iPTH 658 3/18 down from 1273 1/21 and 2348 05/2014  Assessment/Plan: 1. Fever/weakness likely related to right lower extremity wounds (right heel ulcer and right lateral ulcer over the 5th metatarsal) with severe celllulitis- MRI- soft tissue ulcer/ no evidence of osteomyelitis. On vanc and cefepime. VVS following. Cardiology has seen; huge surgical risk due to prior arrest- ECHO EF 40-45%. - AKA may be her best option but will treat w antibx through the weekend- hx of prior MSSA wound culture in Jan 2016. BC 3/30 - no growth to date- looks better and WBC improving 12.7 2. ESRD - MWF - . Plan for next on Monday- UF as able 3. Hypertension/volume -BP 146/71 underedw at 67.5kgs chronic hypotension on midodrine -  increased midodrine to 10 pre HD, use temp 36 and profile 4  4. Anemia -has been on Mircera 75 q 4 weeks last given 3/23, due 3 more doses of venofer; follow Hgb10.9 5. Metabolic bone disease - Ca+ 9.3 allergies list vit D analogue but can take  calcitriol (and previously took Hectorol last admission) /renvela 6. Nutrition - Alb 2.6 renal diet + vitamin/ nepro 7. 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 DM - per primary  Jetty Duhamel, NP [facility] Beeper [phone] 12/11/2014,9:48 AM  LOS: 4 days    Patient seen and examined, agree with above note with above modifications. Patient has had a pretty good response to ABX this hosp but not sure will be good enough to avoid amputation- HD planned for tomorrow [name], MD 12/11/2014      Additional Objective Labs: Basic Metabolic Panel:  Recent Labs Lab 12/08/14 0630 12/09/14 0520 12/11/14 0727  NA 134* 135 133*  K 3.8 4.3 4.0  CL 96 96 96  CO2 GLUCOSE 207* 144* 139*  BUN 26* 45* 51*  CREATININE 4.05* 5.46* 5.21*  CALCIUM 9.2 9.5 9.3   Liver Function Tests:  Recent Labs Lab 12/07/14 1143 12/09/14 0520 12/11/14 0727  AST 25 46* 35  ALT ALKPHOS 118* 135* 111  BILITOT 1.3* 1.5* 1.2  PROT 7.0 6.9 6.7  ALBUMIN 3.3* 2.8* 2.6*   No results for input(s): LIPASE, AMYLASE in the last 168 hours. CBC:  Recent Labs Lab 12/07/14 1143 12/08/14 0630 12/09/14 1500 12/11/14 4540  WBC 16.6* 13.4* 18.4* 12.7*  NEUTROABS 15.1*  --   --   --   HGB 11.3* 10.3* 11.6* 10.9*  HCT 34.1* 30.9* 35.0* 33.2*  MCV 96.1 95.7 99.4 96.2  PLT 225 202 213 202   Blood Culture    Component Value Date/Time   SDES BLOOD RIGHT FOREARM 12/07/2014 1143   SPECREQUEST BOTTLES DRAWN AEROBIC AND ANAEROBIC 5CC 12/07/2014 1143   CULT  12/07/2014 1143           BLOOD CULTURE RECEIVED NO GROWTH TO DATE CULTURE WILL BE HELD FOR 5 DAYS BEFORE ISSUING A FINAL NEGATIVE REPORT [facility]formed at [facility]    REPTSTATUS PENDING 12/07/2014 1143    Cardiac Enzymes: No results for input(s): CKTOTAL, CKMB, CKMBINDEX, TROPONINI in the last 168 hours. CBG:  Recent Labs Lab 12/10/14 0800 12/10/14 1206  12/10/14 1700 12/10/14 2211 12/11/14 0803  GLUCAP 102* 253* 210* 122* 154*   Iron Studies: No results for input(s): IRON, TIBC, TRANSFERRIN, FERRITIN in the last 72 hours. @lablastinr3 @ Studies/Results: No results found. Medications:   . aspirin  81 mg Oral Daily  . atropine  1 application Left Eye Q1200  . calcitRIOL  1.25 mcg Oral Q M,W,F-HD  . ceFEPime (MAXIPIME) IV  2 g Intravenous Q M,W,F-HD  . collagenase  1 application Topical Once per day on Sun Tue Thu Sat  . docusate sodium  100 mg Oral BID  . famotidine  20 mg Oral QHS  . feeding supplement (PRO-STAT SUGAR FREE 64)  30 mL Oral TID  . heparin  5,000 Units Subcutaneous 3 times per day  . insulin aspart  0-5 Units Subcutaneous QHS  . insulin aspart  0-9 Units Subcutaneous TID WC  . metoCLOPramide  10 mg Oral TID AC & HS  . metoprolol tartrate  12.5 mg Oral Q M,W,F-HD  . midodrine  10 mg Oral Q M,W,F-HD  . midodrine  5 mg Oral BID WC  . multivitamin  1 tablet Oral QHS  . neomycin-bacitracin-polymyxin  1 application Topical Daily  . polyethylene glycol  17 g Oral Daily  . sevelamer carbonate  800 mg Oral TID WC  . sodium chloride  3 mL Intravenous Q12H  . vancomycin  750 mg Intravenous Q M,W,F-HD

## 2014-12-11 NOTE — Progress Notes (Signed)
[name], female   DOB: [birth year], 69 y.o.   MRN: [id number] Feeling better this morning. Less discomfort in her right leg.  Afebrile overnight  White blood cell count is down to 12,000 from 18,000 yesterday  He erythema in her right calf is resolved markedly. Still some thickening in the skin.  Dr. Imogene Burnhen will see tomorrow to continue discussion of arteriogram for possible revascularization versus primary amputation.  Has had very nice result with the antibiotic treatment and rest since hospitalized.

## 2014-12-11 NOTE — Progress Notes (Signed)
ANTIBIOTIC CONSULT NOTE - FOLLOW UP  Pharmacy Consult for Vancomycin/Cefepime Indication: Cellulitis  Allergies  Allergen Reactions  . Phenergan [Promethazine Hcl] Other (See Comments)    Causes patient to" vomit more"  . Vitamin D Analogs Rash    She was able to take Hectorol 07/2014 admission and is now tolerating calcitriol 11/2014    Patient Measurements: Height: 5\' 5"  (165.1 cm) Weight: 148 lb 13 oz (67.5 kg) IBW/kg (Calculated) : 57 Adjusted Body Weight:   Vital Signs: Temp: 98.4 F (36.9 C) (04/03 0544) Temp Source: Oral (04/03 0544) BP: 146/71 mmHg (04/03 0544) Pulse Rate: 76 (04/03 0544) Intake/Output from previous day: 04/02 0701 - 04/03 0700 In: 343 [P.O.:340; I.V.:3] Out: -  Intake/Output from this shift:    Labs:  Recent Labs  12/09/14 0520 12/09/14 1500 12/11/14 0727  WBC  --  18.4* 12.7*  HGB  --  11.6* 10.9*  PLT  --  213 202  CREATININE 5.46*  --  5.21*   Estimated Creatinine Clearance: 9.2 mL/min (by C-G formula based on Cr of 5.21). No results for input(s): VANCOTROUGH, VANCOPEAK, VANCORANDOM, GENTTROUGH, GENTPEAK, GENTRANDOM, TOBRATROUGH, TOBRAPEAK, TOBRARND, AMIKACINPEAK, AMIKACINTROU, AMIKACIN in the last 72 hours.    Assessment: R leg redness and swelling  Anticoagulation: SQ heparin (h/o DVT 12/14)  Infectious Disease: Nonhealing ulcers right foot and heel with cellulitis to proximal calf up to the knee. Also has ulceratio of L BKA stump. Cefepime/Vanco D#5 for wound infection. 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 Afeb. WBC 12.7 back down. Abx dosed for ESRD. Pt has h/o MSSA 1/16. Cellulitis appears to be improving. Dr. Imogene Burnhen will see tomorrow to continue discussion of arteriogram for possible revascularization versus primary amputation. Cefepime 3/30>> Vanco 3/30>> Flagyl 3/30>3/31  3/30: BC x 2: pending  Cardiovascular: PAD, CAD with MI, AS, HTN, pulm HTN, CHF (EF 45-50% on 10/15).VSS Meds: ASA81,  Midodrine  Endocrinology: DM2 uncontrolled.on SSI (102-253)  Gastrointestinal / Nutrition: h/o GIB on Pepcid, Reglan, docusate, Miralax  Neurology: h/o CVA on ASA81  Nephrology: ESRD (o/p HD M/W/F) on Cacitriol, Renavite, Renvela  Hematology / Oncology: h/o anemia. Hgb 10.9  Best Practices: SQ heparin, Pepcid   Goal of Therapy:  Vanco level <20 prior to redosing post-HD  Plan:  F/u BC x 2 Change Cefepime to 2g IV qHD MWF Schedule Vanco 750mg  IV q HD MWF   Jakalyn Kratky S. Merilynn [place]obertson, PharmD, BCPS Clinical Staff Pharmacist Pager [phone]  Misty Stanleyobertson, Candie Gintz Stillinger 12/11/2014,1:53 PM

## 2014-12-11 NOTE — Progress Notes (Signed)
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 Hart Rochester She has ABI of 0.2 with multilevel occlusive disease. Doubt  she will heal these ulcers and resolve this infection even with attempt at revascularization percutaneously Patient is not ready for above-knee amputation yet Continue broad-spectrum antibiotics vancomycin and cefepime over the next 3-5 days, day # 4-slowly improving If she has progression of cellulitis, she will need leg amputation Dr. [name] will make decision regarding whether he would like to proceed with angiography early next week hx of prior MSSA wound culture in Jan 2016. BC 3/30 - no growth to date MRI of the right lower extremity shows soft tissue ulcer  in the hindfoot Continue IV morphine when necessary, patient wants to minimize narcotic medications because of confusion Dr. [name] will see tomorrow to continue discussion of arteriogram for possible revascularization versus primary amputation   Primary hypertension/coronary artery disease/mild aortic stenosis Cardiology consultation completed Last 2-D echo October 2015 with an EF of 45-50% Extremely high risk of having another cardiac arrest Diffuse coronary artery disease and not a candidate for revascularization as per  Dr. Excell Seltzer Repeat 2-D echo per cardiology, shows EF of 40-45%, essentially unchanged Continue beta blocker   ESRD - MWF - HD via AVF actually had been going reasonably well all things considered, will plan for HD next Monday   Hypertension On midodrine, metoprolol on non hemodialysis days  Anemia per nephrology Hemoglobin stable    Code Status: full Family Communication: Discussed with the patient's daughter, Terrilyn, [place] , several times today Disposition Plan:  As above    Brief narrative: This is a 69 y.o. female who we've been consulted on regarding bilateral leg wounds and cellulitis. She is accompanied by her daughter. She reports acute onset of redness and pain to her right lower leg since last night. She was also vomiting last night. She was recently seen in the office on 12/03/14 by Dr. [name] who declared her to have bilateral lower extremity critical limb ischemia. She reports a wound to her left below knee stump and two wounds on her right foot that started about six weeks ago. She has been going to the wound center for treatment. She reports some improvement in her wounds but was referred to Dr. [name] for evaluate circulation for wound healing. She has a PMH of three vessel CAD, AS, ESRD on HD, and DM II. She has also had three cardiac arrests during previous hospital admissions. Given her significant co-morbidities, she was deemed not to be a surgical candidate. Plans were made for arteriogram with bilateral runoff and intervention. According to her daughter, the patient was to have an MRI to evaluate for osteomyelitis of her right foot.  She's not had any episodes of chest pain. Her breathing is stable. She's had a slow gradual decline in health. She's had difficulty  with dialysis for the past couple days. They've not been able to pull as much fluid as they would like.  She's not had a stress Myoview study in a long time.  She had a she's had progressive cellulitis of  her right lower leg for the past several weeks. She's been seen by Dr. [name] for possible revascularization .   Her past 3 surgical procedures have been complicated by cardiac arrest and prolonged hospitalizations. She's been deemed not a surgical candidate for open revascularization. She may be a candidate for amputation.   Consultants:    Cardiology  Vascular surgery  Nephrology    Procedures:  Hemodialysis  Antibiotics: Vancomycin cefepime  HPI/Subjective: No complaints afebrile overnight She wants to minimize narcotic medications that they make her confused  Objective: Filed Vitals:   12/09/14 2115 12/10/14 0511 12/10/14 2214 12/11/14 0544  BP: 128/70 148/62 129/66 146/71  Pulse: 80 79 80 76  Temp: 99.2 F (37.3 C) 98.8 F (37.1 C) 98.6 F (37 C) 98.4 F (36.9 C)  TempSrc: Oral Oral Oral Oral  Resp: Height:      Weight:  67 kg (147 lb 11.3 oz)  67.5 kg (148 lb 13 oz)  SpO2: 99% 95% 100% 99%    Intake/Output Summary (Last 24 hours) at 12/11/14 1306 Last data filed at 12/10/14 2340  Gross per 24 hour  Intake    233 ml  Output      0 ml  Net    233 ml    Exam:  General: No acute respiratory distress Lungs: Clear to auscultation bilaterally without wheezes or crackles Cardiovascular: Regular rate and rhythm without murmur gallop or rub normal S1 and S2 Abdomen: Nontender, nondistended, soft, bowel sounds positive, no rebound, no ascites, no appreciable mass Extremities: cellulitis of right lower extremity from below knee down to foot with blisters on upper thigh improving. Slight improvement in erythema. Necrotic right heel wound. Full thickness wound with exposed bone to right lateral foot. Left below knee stump with ulceration at distal stump.      Data Reviewed: Basic Metabolic Panel:  Recent Labs Lab 12/07/14 1143 12/08/14 0630 12/09/14 0520 12/11/14 0727  NA 134* 134* 135 133*  K 4.2 3.8 4.3 4.0  CL 94* 96 96 96  CO2 GLUCOSE 217* 207* 144* 139*  BUN 50* 26* 45* 51*  CREATININE 6.69* 4.05* 5.46* 5.21*  CALCIUM 10.1 9.2 9.5 9.3    Liver Function Tests:  Recent Labs Lab 12/07/14 1143 12/09/14 0520 12/11/14 0727  AST 25 46* 35  ALT ALKPHOS 118* 135* 111  BILITOT 1.3* 1.5* 1.2  PROT 7.0 6.9 6.7  ALBUMIN 3.3* 2.8* 2.6*   No results for input(s): LIPASE, AMYLASE in the last 168 hours. No results for input(s): AMMONIA in the last 168 hours.  CBC:  Recent Labs Lab 12/07/14 1143 12/08/14 0630 12/09/14 1500 12/11/14 0727  WBC 16.6* 13.4* 18.4* 12.7*  NEUTROABS 15.1*  --   --   --   HGB 11.3* 10.3* 11.6* 10.9*  HCT 34.1* 30.9* 35.0* 33.2*  MCV 96.1 95.7 99.4 96.2  PLT 225 202 213 202    Cardiac Enzymes: No results for input(s): CKTOTAL, CKMB, CKMBINDEX, TROPONINI in the last 168 hours. BNP (last 3 results) No results for input(s): BNP in the last 8760 hours.  ProBNP (last 3 results) No results for input(s): PROBNP in the last 8760 hours.  CBG:  Recent Labs Lab 12/10/14 1206 12/10/14 1700 12/10/14 2211 12/11/14 0803 12/11/14 1225  GLUCAP 253* 210* 122* 154* 241*    Recent Results (from the past 240 hour(s))  Culture, blood (routine x 2)     Status: None (Preliminary result)   Collection Time: 12/07/14 11:31 AM  Result Value Ref Range Status   Specimen Description BLOOD HAND RIGHT  Final   Special Requests BOTTLES DRAWN AEROBIC AND ANAEROBIC 5CC  Final   Culture   Final           BLOOD CULTURE RECEIVED NO GROWTH TO DATE CULTURE WILL BE HELD FOR 5 DAYS BEFORE ISSUING A FINAL NEGATIVE REPORT Performed at [facility]    Report Status PENDING  Incomplete  Culture, blood (routine x 2)     Status: None (Preliminary result)   Collection Time: 12/07/14 11:43 AM  Result Value Ref Range Status   Specimen Description BLOOD RIGHT FOREARM  Final   Special Requests BOTTLES DRAWN AEROBIC AND ANAEROBIC 5CC  Final   Culture   Final           BLOOD  CULTURE RECEIVED NO GROWTH TO DATE CULTURE WILL BE HELD FOR 5 DAYS BEFORE ISSUING A FINAL NEGATIVE REPORT Performed at [facility]    Report Status PENDING  Incomplete     Studies: Mr Foot Right Wo Contrast  12/08/2014   CLINICAL DATA:  Peripheral vascular disease. Ulceration along the lateral aspect of the heel.  EXAM: MRI OF THE RIGHT FOREFOOT WITHOUT CONTRAST  TECHNIQUE: Multiplanar, multisequence MR imaging was performed. No intravenous contrast was administered.  COMPARISON:  None.  FINDINGS: Peroneal: Peroneal longus tendon intact. Peroneal brevis intact.  Posteromedial: Posterior tibial tendon intact. Flexor hallucis longus tendon intact. Flexor digitorum longus tendon intact.  Anterior: Tibialis anterior tendon intact. Extensor hallucis longus tendon intact Extensor digitorum longus tendon intact.  Achilles: Intact.  Plantar Fascia: Intact.  LIGAMENTS  Medial: Deltoid ligament intact. Spring ligament intact.  Lateral: Anterior talofibular ligament intact. Calcaneofibular ligament intact. Posterior talofibular ligament intact. Anterior and posterior tibiofibular ligaments intact.  Ankle Joint: No joint effusion. No dislocation.  No chondral defect.  Subtalar Joint/Sinus Tarsi: No joint effusion. Normal subtalar joints. Normal sinus tarsi.  Bone/Soft tissue:  There is a soft tissue ulcer along the lateral aspect of the hindfoot. There is no adjacent focal fluid collection to suggest an abscess. There is soft tissue edema throughout the right foot and ankle.  There is no focal marrow signal abnormality within the calcaneus. There is no bone destruction or periosteal reaction.  There is a transverse linear signal abnormality involving the fifth metatarsal neck without surrounding marrow edema likely reflecting a healed or chronic ununited fracture. There is no other marrow signal abnormality. 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  IMPRESSION: 1. Soft tissue ulcer along the  lateral aspect of the hindfoot without a focal fluid collection to suggest an abscess. Soft tissue edema throughout the right foot and ankle which is nonspecific and may reflect reactive edema versus cellulitis. 2. No evidence of osteomyelitis of the right ankle.   Electronically Signed   By: Elige Ko   On: 12/08/2014 12:40   Dg Knee Complete 4 Views Left  11/17/2014   CLINICAL DATA:  Open wound.  Ulceration.  EXAM: LEFT KNEE - COMPLETE 4+ VIEW  COMPARISON:  None.  FINDINGS: Surgical clips noted over the left lower extremity. Patient has had prior per lobe knee amputation. Peripheral vascular calcifications  present. No evidence of effusion. Ulceration noted of the distal stomach. No acute or underlying bony abnormality.  IMPRESSION: 1. Left BKA. Ulceration of the soft tissues of the distal stump. No underlying bony abnormality identified. 2. Peripheral vascular disease.   Electronically Signed   By: Maisie Fushomas  Register   On: 11/17/2014 13:47   Dg Foot Complete Right  11/17/2014   CLINICAL DATA:  Right foot ulcer laterally  EXAM: RIGHT FOOT COMPLETE - 3+ VIEW  COMPARISON:  None.  FINDINGS: Leandra KernLucency is noted within the lateral soft tissues consistent with the patient's given clinical history. No underlying bony destruction is identified to suggest osteomyelitis at this time. Mild osteopenia is seen. Diffuse vascular calcifications are noted.  IMPRESSION: Soft tissue wound without definitive bony abnormality.   Electronically Signed   By: Alcide CleverMark  Lukens M.D.   On: 11/17/2014 13:47    Scheduled Meds: . aspirin  81 mg Oral Daily  . atropine  1 application Left Eye Q1200  . calcitRIOL  1.25 mcg Oral Q M,W,F-HD  . ceFEPime (MAXIPIME) IV  2 g Intravenous Q M,W,F-HD  . collagenase  1 application Topical Once per day on Sun Tue Thu Sat  . docusate sodium  100 mg Oral BID  . famotidine  20 mg Oral QHS  . feeding supplement (PRO-STAT SUGAR FREE 64)  30 mL Oral TID  . heparin  5,000 Units Subcutaneous 3 times per  day  . insulin aspart  0-5 Units Subcutaneous QHS  . insulin aspart  0-9 Units Subcutaneous TID WC  . metoCLOPramide  10 mg Oral TID AC & HS  . metoprolol tartrate  12.5 mg Oral Q M,W,F-HD  . midodrine  10 mg Oral Q M,W,F-HD  . midodrine  5 mg Oral BID WC  . multivitamin  1 tablet Oral QHS  . neomycin-bacitracin-polymyxin  1 application Topical Daily  . polyethylene glycol  17 g Oral Daily  . sevelamer carbonate  800 mg Oral TID WC  . sodium chloride  3 mL Intravenous Q12H  . 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 CenterBROL,Lashika Erker  [facility] Pager [phone]. If 7PM-7AM, please contact night-coverage at [email or web address], password [email or web address] 12/11/2014, 1:06 PM  LOS: 4 days

## 2014-12-12 LAB — GLUCOSE, CAPILLARY
GLUCOSE-CAPILLARY: 115 mg/dL — AB (ref 70–99)
GLUCOSE-CAPILLARY: 160 mg/dL — AB (ref 70–99)
GLUCOSE-CAPILLARY: 141 mg/dL — AB (ref 70–99)
Glucose-Capillary: 203 mg/dL — ABNORMAL HIGH (ref 70–99)
Glucose-Capillary: 186 mg/dL — ABNORMAL HIGH (ref 70–99)

## 2014-12-12 MED ORDER — LIDOCAINE HCL (PF) 1 % IJ SOLN: 5.0000 mL | INTRAMUSCULAR | Status: DC | PRN

## 2014-12-12 MED ORDER — NEPRO/CARBSTEADY PO LIQD
237.0000 mL | ORAL | Status: DC | PRN
  Filled 2014-12-12: qty 237

## 2014-12-12 MED ORDER — LIDOCAINE-PRILOCAINE 2.5-2.5 % EX CREA
1.0000 "application " | TOPICAL_CREAM | CUTANEOUS | Status: DC | PRN
  Filled 2014-12-12: qty 5

## 2014-12-12 MED ORDER — MIDODRINE HCL 5 MG PO TABS
ORAL_TABLET | ORAL | Status: AC
  Filled 2014-12-12: qty 1

## 2014-12-12 MED ORDER — ALTEPLASE 2 MG IJ SOLR
2.0000 mg | Freq: Once | INTRAMUSCULAR | Status: DC | PRN
  Filled 2014-12-12: qty 2

## 2014-12-12 MED ORDER — SODIUM CHLORIDE 0.9 % IV SOLN: 100.0000 mL | INTRAVENOUS | Status: DC | PRN

## 2014-12-12 MED ORDER — PENTAFLUOROPROP-TETRAFLUOROETH EX AERO: 1.0000 "application " | INHALATION_SPRAY | CUTANEOUS | Status: DC | PRN

## 2014-12-12 MED ORDER — HEPARIN SODIUM (PORCINE) 1000 UNIT/ML DIALYSIS
5000.0000 [IU] | Freq: Once | INTRAMUSCULAR | Status: DC
  Filled 2014-12-12: qty 5

## 2014-12-12 MED ORDER — HEPARIN SODIUM (PORCINE) 1000 UNIT/ML DIALYSIS: 1000.0000 [IU] | INTRAMUSCULAR | Status: DC | PRN

## 2014-12-12 NOTE — Progress Notes (Signed)
Hemodialysis= Pt tolerated well. 2L goal met. Given Midodrine pre treatment and vancomycin during last hour.  Maxipime given on HD unit post rinseback. All other medications returned with patient to floor. Pt has no complaints.

## 2014-12-12 NOTE — Progress Notes (Signed)
   Daily Progress Note  Assessment/Planning: RLE osteomyelitis, BLE CLI, severe CAD (medical management only)   R leg cellulitis improved  No evidence of osteomyelitis on MRI  Pt's cardiac status prohibits open revascularization.  Her status would also make an amputation a higher risk than usual.  L BKA stump also demonstrates some ischemia.  Only chance of revascularization of the R leg would be an endovascular procedure.  Pt and family were going to consider proceeding with such.  The patient is still undecided on proceeding.  Realistically, she is looking at possible R AKA followed by a L AKA if she develops any complications.  In the setting of severe CAD, I'm not certain that she would definitively benefit either AKA.  For now continue with abx and wound care, consider palliative care consult to help sort through issues  Subjective    "feeling better"  Objective Filed Vitals:   12/11/14 1355 12/11/14 2232 12/11/14 2233 12/12/14 0526  BP: 133/82 153/76  145/72  Pulse: 78  81 79  Temp: 99.1 F (37.3 C) 98.4 F (36.9 C)  98.4 F (36.9 C)  TempSrc: Oral Oral  Oral  Resp: 16 18  18   Height:      Weight:      SpO2: 99%  99% 96%    Intake/Output Summary (Last 24 hours) at 12/12/14 0913 Last data filed at 12/11/14 2127  Gross per 24 hour  Intake    840 ml  Output      0 ml  Net    840 ml    VASC  L BKA: atrophied, some clean, subcutaneous fat evident with some skin breakdown, R heel: clean, dry eschar, lateral ulcer with faint smell and serosang drainage on bandages  Laboratory CBC    Component Value Date/Time   WBC 12.7* 12/11/2014 0727   HGB 10.9* 12/11/2014 0727   HCT 33.2* 12/11/2014 0727   PLT 202 12/11/2014 0727    BMET    Component Value Date/Time   NA 133* 12/11/2014 0727   K 4.0 12/11/2014 0727   CL 96 12/11/2014 0727   CO2 23 12/11/2014 0727   GLUCOSE 139* 12/11/2014 0727   BUN 51* 12/11/2014 0727   CREATININE 5.21* 12/11/2014 0727   CALCIUM 9.3 12/11/2014 0727   GFRNONAA 8* 12/11/2014 0727   GFRAA 9* 12/11/2014 0727    Leonides SakeBrian Ziana Heyliger, MD [facility] [place] Office: [phone] Pager: [phone]  12/12/2014, 9:13 AM

## 2014-12-12 NOTE — Progress Notes (Signed)
[facility] PROGRESS NOTE  Malynn S Raglin EAV:[id number] DOB: 22-Jul-1946 DOA: 12/07/2014 PCP: [name], MD  Assessment/Plan: Principal Problem:   Cellulitis Active Problems:   Diabetes mellitus with end stage renal disease   ESRD (end stage renal disease) on dialysis   PAD (peripheral artery disease)   CAD- severe 3V CAD at cath March 2015- medical Rx   CVA (cerebral infarction)   Leukocytosis   Severe peripheral vascular disease bilateral lower extremity critical limb ischemia/ulceration of the left below-knee stump  manifested by right necrotic heel wound and right lateral full thickness foot wound with exposed bone, cellulitis of right lower leg  Followed by vascular surgery not a candidate for open surgical bypass per Dr. Hart Rochester She has ABI of 0.2 with multilevel occlusive disease.  Per Dr [name] Only chance of revascularization of the R leg would be an endovascular procedure. Pt and family were going to consider proceeding with such. The patient is still undecided on proceeding. Realistically, she is looking at possible R AKA followed by a L AKA if she develops any complications.  Continue broad-spectrum antibiotics vancomycin and cefepime over the next 3-5 days, day # 4-slowly improving hx of prior MSSA wound culture in Jan 2016. BC 3/30 - no growth to date MRI of the right lower extremity shows soft tissue ulcer  in the hindfoot Continue IV morphine when necessary, patient wants to minimize narcotic medications because of confusion    Primary hypertension/coronary artery disease/mild aortic stenosis Cardiology consultation completed Last 2-D echo October 2015 with an EF of 45-50% Extremely high risk of having another cardiac arrest Diffuse coronary artery disease and not a candidate for revascularization as per Dr. Excell Seltzer Repeat 2-D echo per cardiology, shows EF of 40-45%, essentially unchanged Continue beta blocker   ESRD - MWF - HD via AVF actually had  been going reasonably well all things considered, will plan for HD next Monday   Hypertension On midodrine, metoprolol on non hemodialysis days  Anemia per nephrology Hemoglobin stable  Diabetes mellitus Continue with NovoLog SSI We'll repeat hemoglobin A1c   Code Status: full Family Communication: Discussed with the patient's daughter, Jolanta, Cabeza , several times today Disposition Plan:  As above    Brief narrative: This is a 69 y.o. female who we've been consulted on regarding bilateral leg wounds and cellulitis. She is accompanied by her daughter. She reports acute onset of redness and pain to her right lower leg since last night. She was also vomiting last night. She was recently seen in the office on 12/03/14 by Dr. [name] who declared her to have bilateral lower extremity critical limb ischemia. She reports a wound to her left below knee stump and two wounds on her right foot that started about six weeks ago. She has been going to the wound center for treatment. She reports some improvement in her wounds but was referred to Dr. [name] for evaluate circulation for wound healing. She has a PMH of three vessel CAD, AS, ESRD on HD, and DM II. She has also had three cardiac arrests during previous hospital admissions. Given her significant co-morbidities, she was deemed not to be a surgical candidate. Plans were made for arteriogram with bilateral runoff and intervention. According to her daughter, the patient was to have an MRI to evaluate for osteomyelitis of her right foot.  She's not had any episodes of chest pain. Her breathing is stable. She's had a slow gradual decline in health. She's had difficulty with dialysis for the past  couple days. They've not been able to pull as much fluid as they would like.  She's not had a stress Myoview study in a long time.  She had a she's had progressive cellulitis of her right lower leg for the past several weeks. She's been seen by Dr. [name]  for possible revascularization .   Her past 3 surgical procedures have been complicated by cardiac arrest and prolonged hospitalizations. She's been deemed not a surgical candidate for open revascularization. She may be a candidate for amputation.   Consultants:    Cardiology  Vascular surgery  Nephrology    Procedures:  Hemodialysis  Antibiotics: Vancomycin cefepime  HPI/Subjective: No complaints afebrile overnight She wants to minimize narcotic medications that they make her confused  Objective: Filed Vitals:   12/11/14 1355 12/11/14 2232 12/11/14 2233 12/12/14 0526  BP: 133/82 153/76  145/72  Pulse: 78  81 79  Temp: 99.1 F (37.3 C) 98.4 F (36.9 C)  98.4 F (36.9 C)  TempSrc: Oral Oral  Oral  Resp: Height:      Weight:      SpO2: 99%  99% 96%    Intake/Output Summary (Last 24 hours) at 12/12/14 1216 Last data filed at 12/11/14 2127  Gross per 24 hour  Intake    600 ml  Output      0 ml  Net    600 ml    Exam:  General: No acute respiratory distress Lungs: Clear to auscultation bilaterally without wheezes or crackles Cardiovascular: Regular rate and rhythm without murmur gallop or rub normal S1 and S2 Abdomen: Nontender, nondistended, soft, bowel sounds positive, no rebound, no ascites, no appreciable mass Extremities: cellulitis of right lower extremity from below knee down to foot with blisters on upper thigh improving. Slight improvement in erythema. Necrotic right heel wound. Full thickness wound with exposed bone to right lateral foot. Left below knee stump with ulceration at distal stump.      Data Reviewed: Basic Metabolic Panel:  Recent Labs Lab 12/07/14 1143 12/08/14 0630 12/09/14 0520 12/11/14 0727  NA 134* 134* 135 133*  K 4.2 3.8 4.3 4.0  CL 94* 96 96 96  CO2 GLUCOSE 217* 207* 144* 139*  BUN 50* 26* 45* 51*  CREATININE 6.69* 4.05* 5.46* 5.21*  CALCIUM 10.1 9.2 9.5 9.3    Liver Function  Tests:  Recent Labs Lab 12/07/14 1143 12/09/14 0520 12/11/14 0727  AST 25 46* 35  ALT ALKPHOS 118* 135* 111  BILITOT 1.3* 1.5* 1.2  PROT 7.0 6.9 6.7  ALBUMIN 3.3* 2.8* 2.6*   No results for input(s): LIPASE, AMYLASE in the last 168 hours. No results for input(s): AMMONIA in the last 168 hours.  CBC:  Recent Labs Lab 12/07/14 1143 12/08/14 0630 12/09/14 1500 12/11/14 0727  WBC 16.6* 13.4* 18.4* 12.7*  NEUTROABS 15.1*  --   --   --   HGB 11.3* 10.3* 11.6* 10.9*  HCT 34.1* 30.9* 35.0* 33.2*  MCV 96.1 95.7 99.4 96.2  PLT 225 202 213 202    Cardiac Enzymes: No results for input(s): CKTOTAL, CKMB, CKMBINDEX, TROPONINI in the last 168 hours. BNP (last 3 results) No results for input(s): BNP in the last 8760 hours.  ProBNP (last 3 results) No results for input(s): PROBNP in the last 8760 hours.    CBG:  Recent Labs Lab 12/11/14 0803 12/11/14 1225 12/11/14 1704 12/11/14 2231 12/12/14 0802  GLUCAP 154*  241* 217* 203* 115*    Recent Results (from the past 240 hour(s))  Culture, blood (routine x 2)     Status: None (Preliminary result)   Collection Time: 12/07/14 11:31 AM  Result Value Ref Range Status   Specimen Description BLOOD HAND RIGHT  Final   Special Requests BOTTLES DRAWN AEROBIC AND ANAEROBIC 5CC  Final   Culture   Final           BLOOD CULTURE RECEIVED NO GROWTH TO DATE CULTURE WILL BE HELD FOR 5 DAYS BEFORE ISSUING A FINAL NEGATIVE REPORT Performed at [facility]    Report Status PENDING  Incomplete  Culture, blood (routine x 2)     Status: None (Preliminary result)   Collection Time: 12/07/14 11:43 AM  Result Value Ref Range Status   Specimen Description BLOOD RIGHT FOREARM  Final   Special Requests BOTTLES DRAWN AEROBIC AND ANAEROBIC 5CC  Final   Culture   Final           BLOOD CULTURE RECEIVED NO GROWTH TO DATE CULTURE WILL BE HELD FOR 5 DAYS BEFORE ISSUING A FINAL NEGATIVE REPORT Performed at [facility]     Report Status PENDING  Incomplete     Studies: Mr Foot Right Wo Contrast  12/08/2014   CLINICAL DATA:  Peripheral vascular disease. Ulceration along the lateral aspect of the heel.  EXAM: MRI OF THE RIGHT FOREFOOT WITHOUT CONTRAST  TECHNIQUE: Multiplanar, multisequence MR imaging was performed. No intravenous contrast was administered.  COMPARISON:  None.  FINDINGS: Peroneal: Peroneal longus tendon intact. Peroneal brevis intact.  Posteromedial: Posterior tibial tendon intact. Flexor hallucis longus tendon intact. Flexor digitorum longus tendon intact.  Anterior: Tibialis anterior tendon intact. Extensor hallucis longus tendon intact Extensor digitorum longus tendon intact.  Achilles: Intact.  Plantar Fascia: Intact.  LIGAMENTS  Medial: Deltoid ligament intact. Spring ligament intact.  Lateral: Anterior talofibular ligament intact. Calcaneofibular ligament intact. Posterior talofibular ligament intact. Anterior and posterior tibiofibular ligaments intact.  Ankle Joint: No joint effusion. No dislocation.  No chondral defect.  Subtalar Joint/Sinus Tarsi: No joint effusion. Normal subtalar joints. Normal sinus tarsi.  Bone/Soft tissue:  There is a soft tissue ulcer along the lateral aspect of the hindfoot. There is no adjacent focal fluid collection to suggest an abscess. There is soft tissue edema throughout the right foot and ankle.  There is no focal marrow signal abnormality within the calcaneus. There is no bone destruction or periosteal reaction.  There is a transverse linear signal abnormality involving the fifth metatarsal neck without surrounding marrow edema likely reflecting a healed or chronic ununited fracture. There is no other marrow signal abnormality. 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  IMPRESSION: 1. Soft tissue ulcer along the lateral aspect of the hindfoot without a focal fluid collection to suggest an abscess. Soft tissue edema throughout the right foot and ankle  which is nonspecific and may reflect reactive edema versus cellulitis. 2. No evidence of osteomyelitis of the right ankle.   Electronically Signed   By: Elige KoHetal  Patel   On: 12/08/2014 12:40   Dg Knee Complete 4 Views Left  11/17/2014   CLINICAL DATA:  Open wound.  Ulceration.  EXAM: LEFT KNEE - COMPLETE 4+ VIEW  COMPARISON:  None.  FINDINGS: Surgical clips noted over the left lower extremity. Patient has had prior per lobe knee amputation. Peripheral vascular calcifications present. No evidence of effusion. Ulceration noted of the distal stomach. No acute or underlying bony abnormality.  IMPRESSION: 1. Left BKA. Ulceration of the soft tissues of the distal stump. No underlying bony abnormality identified. 2. Peripheral vascular disease.   Electronically Signed   By: Maisie Fus  Register   On: 11/17/2014 13:47   Dg Foot Complete Right  11/17/2014   CLINICAL DATA:  Right foot ulcer laterally  EXAM: RIGHT FOOT COMPLETE - 3+ VIEW  COMPARISON:  None.  FINDINGS: Leandra Kern is noted within the lateral soft tissues consistent with the patient's given clinical history. No underlying bony destruction is identified to suggest osteomyelitis at this time. Mild osteopenia is seen. Diffuse vascular calcifications are noted.  IMPRESSION: Soft tissue wound without definitive bony abnormality.   Electronically Signed   By: Alcide Clever M.D.   On: 11/17/2014 13:47    Scheduled Meds: . aspirin  81 mg Oral Daily  . atropine  1 application Left Eye Q1200  . calcitRIOL  1.25 mcg Oral Q M,W,F-HD  . ceFEPime (MAXIPIME) IV  2 g Intravenous Q M,W,F-HD  . collagenase  1 application Topical Once per day on Sun Tue Thu Sat  . docusate sodium  100 mg Oral BID  . famotidine  20 mg Oral QHS  . feeding supplement (PRO-STAT SUGAR FREE 64)  30 mL Oral TID  . heparin  5,000 Units Subcutaneous 3 times per day  . insulin aspart  0-5 Units Subcutaneous QHS  . insulin aspart  0-9 Units Subcutaneous TID WC  . metoCLOPramide  10 mg Oral TID AC &  HS  . metoprolol tartrate  12.5 mg Oral Q M,W,F-HD  . midodrine  10 mg Oral Q M,W,F-HD  . midodrine  5 mg Oral BID WC  . multivitamin  1 tablet Oral QHS  . neomycin-bacitracin-polymyxin  1 application Topical Daily  . polyethylene glycol  17 g Oral Daily  . sevelamer carbonate  800 mg Oral TID WC  . sodium chloride  3 mL Intravenous Q12H  . vancomycin  750 mg Intravenous Q M,W,F-HD   Continuous Infusions:   Principal Problem:   Cellulitis Active Problems:   Diabetes mellitus with end stage renal disease   ESRD (end stage renal disease) on dialysis   PAD (peripheral artery disease)   CAD- severe 3V CAD at cath March 2015- medical Rx   CVA (cerebral infarction)   Leukocytosis    Time spent: 40 minutes   [facility]  [facility] Pager [phone]. If 7PM-7AM, please contact night-coverage at [email or web address], password [facility] 12/12/2014, 12:16 PM  LOS: 5 days

## 2014-12-12 NOTE — Progress Notes (Signed)
All future medications including held BP meds and vancomycin given to [facility] RN who verbally stated these would be given down in dialysis.

## 2014-12-12 NOTE — Progress Notes (Signed)
[facility] Progress Note   Subjective: up in bed, drinking coffee  Filed Vitals:   12/11/14 1355 12/11/14 2232 12/11/14 2233 12/12/14 0526  BP: 133/82 153/76  145/72  Pulse: 78  81 79  Temp: 99.1 F (37.3 C) 98.4 F (36.9 C)  98.4 F (36.9 C)  TempSrc: Oral Oral  Oral  Resp: 16 18  18   Height:      Weight:      SpO2: 99%  99% 96%   Exam: Alert, chronically ill-appearing No jvd Chest clear on R, scant fine rales L base RRR 2-3/6 holosyst M, no RG Abd soft, NTND, no ascites RLE foot wrapped, mild LE edema, waning erythema L BK stump wrapped no edema Neuro is alert, a bit lethargic, nf  HD: [name] MWF 4h   72.5kg   2/2 Bath LUA AVF  Heparin 5000 Mircera 75 q 4 wks last 3/23, Venofer 100 / HD, Calcitriol 1.25 ug TIW Pth 658 declining        Assessment: 1. Fevers / RLE cellulitis / R foot ulcers- MRI negative for osteo, on IV abx and VVS seeing 2. ESRD - HD MWF 3. Hypertension/volume -BP 146/71 under edw at 67.5kgs, chronic hypotension on midodrine - increased midodrine to 10 pre HD, use temp 36 and profile 4  4. Anemia -has been on Mircera 75 q 4 weeks last given 3/23, due 3 more doses of venofer; follow Hgb10.9 5. Metabolic bone disease - Ca+ 9.3 allergies list vit D analogue but can take calcitriol (and previously took Hectorol last admission) /renvela 6. Nutrition - Alb 2.6 renal diet + vitamin/ nepro 7. Severe ASCVD / EF 40-45%- with 3 vessel CAD (cath 3/15 showed progression from 2011 study, not surg candidate, medical Rx), PVD, hx CVA - full code - cards following  8. DM - per primary   Plan - HD today    Vinson Moselleob Satrina Magallanes MD  pager [phone]    cell [phone]  12/12/2014, 9:40 AM     Recent Labs Lab 12/08/14 0630 12/09/14 0520 12/11/14 0727  NA 134* 135 133*  K 3.8 4.3 4.0  CL 96 96 96  CO2 26 24 23   GLUCOSE 207* 144* 139*  BUN 26* 45* 51*  CREATININE 4.05* 5.46* 5.21*  CALCIUM 9.2 9.5 9.3    Recent Labs Lab 12/07/14 1143  12/09/14 0520 12/11/14 0727  AST 25 46* 35  ALT 20 25 20   ALKPHOS 118* 135* 111  BILITOT 1.3* 1.5* 1.2  PROT 7.0 6.9 6.7  ALBUMIN 3.3* 2.8* 2.6*    Recent Labs Lab 12/07/14 1143 12/08/14 0630 12/09/14 1500 12/11/14 0727  WBC 16.6* 13.4* 18.4* 12.7*  NEUTROABS 15.1*  --   --   --   HGB 11.3* 10.3* 11.6* 10.9*  HCT 34.1* 30.9* 35.0* 33.2*  MCV 96.1 95.7 99.4 96.2  PLT 225 202 213 202   . aspirin  81 mg Oral Daily  . atropine  1 application Left Eye Q1200  . calcitRIOL  1.25 mcg Oral Q M,W,F-HD  . ceFEPime (MAXIPIME) IV  2 g Intravenous Q M,W,F-HD  . collagenase  1 application Topical Once per day on Sun Tue Thu Sat  . docusate sodium  100 mg Oral BID  . famotidine  20 mg Oral QHS  . feeding supplement (PRO-STAT SUGAR FREE 64)  30 mL Oral TID  . heparin  5,000 Units Subcutaneous 3 times per day  . insulin aspart  0-5 Units Subcutaneous QHS  . insulin aspart  0-9 Units Subcutaneous TID WC  . metoCLOPramide  10 mg Oral TID AC & HS  . metoprolol tartrate  12.5 mg Oral Q M,W,F-HD  . midodrine  10 mg Oral Q M,W,F-HD  . midodrine  5 mg Oral BID WC  . multivitamin  1 tablet Oral QHS  . neomycin-bacitracin-polymyxin  1 application Topical Daily  . polyethylene glycol  17 g Oral Daily  . sevelamer carbonate  800 mg Oral TID WC  . sodium chloride  3 mL Intravenous Q12H  . vancomycin  750 mg Intravenous Q M,W,F-HD     sodium chloride, acetaminophen **OR** acetaminophen, feeding supplement (NEPRO CARB STEADY), morphine injection, ondansetron **OR** ondansetron (ZOFRAN) IV, oxyCODONE, sodium chloride

## 2014-12-12 NOTE — Clinical Social Work Note (Signed)
CSW received call from CPS social worker. CPS social worker states that CPS needs to speak with the patient tomorrow and requests that patient be available to meet with her tomorrow at 10:30am in the conference room.   Roddie McBryant Lauralynn Loeb MSW, RinglingLCSWA, HannahLCASA, [phone]

## 2014-12-13 LAB — COMPREHENSIVE METABOLIC PANEL
ALBUMIN: 2.5 g/dL — AB (ref 3.5–5.2)
ALT: 16 U/L (ref 0–35)
AST: 25 U/L (ref 0–37)
Alkaline Phosphatase: 115 U/L (ref 39–117)
Anion gap: 12 (ref 5–15)
BILIRUBIN TOTAL: 1.4 mg/dL — AB (ref 0.3–1.2)
BUN: 51 mg/dL — ABNORMAL HIGH (ref 6–23)
CHLORIDE: 98 mmol/L (ref 96–112)
CO2: 28 mmol/L (ref 19–32)
CREATININE: 4.5 mg/dL — AB (ref 0.50–1.10)
Calcium: 9.2 mg/dL (ref 8.4–10.5)
GFR calc Af Amer: 11 mL/min — ABNORMAL LOW (ref >90)
GFR calc non Af Amer: 9 mL/min — ABNORMAL LOW (ref >90)
Glucose, Bld: 161 mg/dL — ABNORMAL HIGH (ref 70–99)
POTASSIUM: 4.1 mmol/L (ref 3.5–5.1)
SODIUM: 138 mmol/L (ref 135–145)
Total Protein: 7 g/dL (ref 6.0–8.3)

## 2014-12-13 LAB — CBC
HEMATOCRIT: 31.9 % — AB (ref 36.0–46.0)
HEMOGLOBIN: 10.4 g/dL — AB (ref 12.0–15.0)
MCH: 31.7 pg (ref 26.0–34.0)
MCHC: 32.6 g/dL (ref 30.0–36.0)
MCV: 97.3 fL (ref 78.0–100.0)
Platelets: 293 10*3/uL (ref 150–400)
RBC: 3.28 MIL/uL — ABNORMAL LOW (ref 3.87–5.11)
RDW: 17.7 % — ABNORMAL HIGH (ref 11.5–15.5)
WBC: 12.9 10*3/uL — AB (ref 4.0–10.5)

## 2014-12-13 LAB — CULTURE, BLOOD (ROUTINE X 2)
CULTURE: NO GROWTH
Culture: NO GROWTH

## 2014-12-13 LAB — GLUCOSE, CAPILLARY
GLUCOSE-CAPILLARY: 160 mg/dL — AB (ref 70–99)
GLUCOSE-CAPILLARY: 201 mg/dL — AB (ref 70–99)
Glucose-Capillary: 166 mg/dL — ABNORMAL HIGH (ref 70–99)
Glucose-Capillary: 138 mg/dL — ABNORMAL HIGH (ref 70–99)

## 2014-12-13 LAB — HEMOGLOBIN A1C
Hgb A1c MFr Bld: 6.5 % — ABNORMAL HIGH (ref 4.8–5.6)
Mean Plasma Glucose: 140 mg/dL

## 2014-12-13 NOTE — Consult Note (Signed)
WOC follow-up:  Refer to previous consult performed on 3/31.  Daughter and patient requested orders for topical treatment be clarified for bedside nurses to follow. Discussed plan of care and re-worded wound care orders to minimize confusion.  Pt and daughter verbalizes understanding. Please re-consult if further assistance is needed.  Thank-you,  Cammie Mcgeeawn Izabela Ow MSN, RN, CWOCN, RobbinsWCN-AP, CNS [phone]

## 2014-12-13 NOTE — Progress Notes (Signed)
Medicare Important Message given?  YES Date Medicare IM given:  12/13/2014 Medicare IM given by:  Bexlee Bergdoll  

## 2014-12-13 NOTE — Progress Notes (Signed)
   Daily Progress Note  Assessment/Planning: BLE CLI with ulcers, RLE cellulitis, severe CAD (medical management only)   Pt and family agree to: Aortogram, bilateral leg runoff, and possible right leg intervention I discussed with the patient the nature of angiographic procedures, especially the limited patencies of any endovascular intervention.  The patient is aware of that the risks of an angiographic procedure include but are not limited to: bleeding, infection, access site complications, renal failure, embolization, rupture of vessel, dissection, possible need for emergent surgical intervention, possible need for surgical procedures to treat the patient's pathology, anaphylactic reaction to contrast, and stroke and death.   The patient is aware of the risks and agrees to proceed. This procedure is scheduled for Thursday.  Subjective    Pain improved  Objective Filed Vitals:   12/12/14 1743 12/12/14 2140 12/13/14 0102 12/13/14 0521  BP: 167/76 105/46 156/69 110/43  Pulse:  111  87  Temp: 98.6 F (37 C) 100.6 F (38.1 C) 100.4 F (38 C) 99.1 F (37.3 C)  TempSrc: Oral Oral Oral Oral  Resp: 16 20 16 18   Height:      Weight:      SpO2: 99% 97%  100%    Intake/Output Summary (Last 24 hours) at 12/13/14 1220 Last data filed at 12/13/14 0333  Gross per 24 hour  Intake    120 ml  Output   1928 ml  Net  -1808 ml   VASC  R heel bandages in place, Cellulitis resolved  Laboratory CBC    Component Value Date/Time   WBC 12.9* 12/13/2014 0655   HGB 10.4* 12/13/2014 0655   HCT 31.9* 12/13/2014 0655   PLT 293 12/13/2014 0655    BMET    Component Value Date/Time   NA 138 12/13/2014 0655   K 4.1 12/13/2014 0655   CL 98 12/13/2014 0655   CO2 28 12/13/2014 0655   GLUCOSE 161* 12/13/2014 0655   BUN 51* 12/13/2014 0655   CREATININE 4.50* 12/13/2014 0655   CALCIUM 9.2 12/13/2014 0655   GFRNONAA 9* 12/13/2014 0655   GFRAA 11* 12/13/2014 0655    Leonides SakeBrian Chen, MD [facility] Office: [phone] Pager: [phone]  12/13/2014, 12:20 PM

## 2014-12-13 NOTE — Progress Notes (Signed)
[facility] Progress Note  Assessment/Plan: 1. Fevers / RLE cellulitis / R foot ulcers-Tmax 100.6 MRI negative for osteo, on IV Vanc/Maxipime and VVS seeing; difficult situation - likely needs right aka and possible left aka, pt's daughter states she is having LE arteriogram on Thursday - family exhausting options before surgery; cellulitis greatly improved but persistent fevers 2. ESRD - HD MWF - HD tomorrow 3. Hypertension/volume - chronic hypotension on midodrine - increased midodrine to 10 pre HD, use temp 36 and profile 4 Net UF 1.9 Monday with post weight of 73.7 - significant wieght variability 4. Anemia -has been on Mircera 75 q 4 weeks last given 3/23, due 3 more doses of venofer; follow Hgb10.4 drifting down - would redose if Hgb < 10 5. Metabolic bone disease - Ca+ 9.2- Corr Ca 10.4 - continue calcitriol for now;  allergies list vit D analogue but can take calcitriol (and previously took Hectorol last admission) /renvela 6. Nutrition - Alb 2.5 renal diet + vitamin/ nepro 7. Severe ASCVD / EF 40-45%- with 3 vessel CAD (cath 3/15 showed progression from 2011 study, not surg candidate, medical Rx), PVD, hx CVA - full code - cards following  8. DM - per primary  Sheffield Slider, PA-C [facility] Beeper [phone] 12/13/2014,12:01 PM  LOS: 6 days   Pt seen, examined and agree w A/P as above. Stable from renal standpoint.  Weights inaccurate but exam shows no vol excess.  For angiogram on Thursday.  Vinson Moselle MD pager [phone]    cell [phone] 12/13/2014, 1:45 PM    Subjective:   No appetite. Pain in right leg better  Objective Filed Vitals:   12/12/14 1743 12/12/14 2140 12/13/14 0102 12/13/14 0521  BP: 167/76 105/46 156/69 110/43  Pulse:  111  87  Temp: 98.6 F (37 C) 100.6 F (38.1 C) 100.4 F (38 C) 99.1 F (37.3 C)  TempSrc: Oral Oral Oral Oral  Resp: Height:      Weight:      SpO2: 99% 97%  100%   Physical  Exam General: NAD breathing easily on room air, a little confused on details of care (daughter with pt clarifying things) Heart: RRR 3/6 murmur Lungs: no rales Abdomen: soft NT Extremities: right LE foot wrapped - remarkably less erythema/swelling compared to admission; left BKA wrapped Dialysis Access: left upper AVF + bruit  Dialysis Orders: [name] MWF 4h 72.5kg 2/2 Bath LUA AVF Heparin 5000 Mircera 75 q 4 wks last 3/23, Venofer 100 / HD, Calcitriol 1.25 ug TIW Pth 658 declining  Additional Objective Labs: Basic Metabolic Panel:  Recent Labs Lab 12/09/14 0520 12/11/14 0727 12/13/14 0655  NA 135 133* 138  K 4.3 4.0 4.1  CL 96 96 98  CO2 GLUCOSE 144* 139* 161*  BUN 45* 51* 51*  CREATININE 5.46* 5.21* 4.50*  CALCIUM 9.5 9.3 9.2   Liver Function Tests:  Recent Labs Lab 12/09/14 0520 12/11/14 0727 12/13/14 0655  AST 46* 35 25  ALT ALKPHOS 135* 111 115  BILITOT 1.5* 1.2 1.4*  PROT 6.9 6.7 7.0  ALBUMIN 2.8* 2.6* 2.5*  CBC:  Recent Labs Lab 12/07/14 1143 12/08/14 0630 12/09/14 1500 12/11/14 0727 12/13/14 0655  WBC 16.6* 13.4* 18.4* 12.7* 12.9*  NEUTROABS 15.1*  --   --   --   --   HGB 11.3* 10.3* 11.6* 10.9* 10.4*  HCT 34.1* 30.9* 35.0* 33.2* 31.9*  MCV 96.1  95.7 99.4 96.2 97.3  PLT 225 202 213 202 293   Blood Culture    Component Value Date/Time   SDES BLOOD RIGHT FOREARM 12/07/2014 1143   SPECREQUEST BOTTLES DRAWN AEROBIC AND ANAEROBIC 5CC 12/07/2014 1143   CULT  12/07/2014 1143    NO GROWTH 5 DAYS [facility]    REPTSTATUS 12/13/2014 FINAL 12/07/2014 1143   CBG:  Recent Labs Lab 12/12/14 0802 12/12/14 1234 12/12/14 1744 12/12/14 2213 12/13/14 0832  GLUCAP 115* 186* 160* 141* 166*  Studies/Results: No results found. Medications:   . aspirin  81 mg Oral Daily  . atropine  1 application Left Eye Q1200  . calcitRIOL  1.25 mcg Oral Q M,W,F-HD  . ceFEPime (MAXIPIME) IV  2 g Intravenous Q  M,W,F-HD  . collagenase  1 application Topical Once per day on Sun Tue Thu Sat  . docusate sodium  100 mg Oral BID  . famotidine  20 mg Oral QHS  . feeding supplement (PRO-STAT SUGAR FREE 64)  30 mL Oral TID  . heparin  5,000 Units Subcutaneous 3 times per day  . insulin aspart  0-5 Units Subcutaneous QHS  . insulin aspart  0-9 Units Subcutaneous TID WC  . metoCLOPramide  10 mg Oral TID AC & HS  . metoprolol tartrate  12.5 mg Oral Q M,W,F-HD  . midodrine  10 mg Oral Q M,W,F-HD  . midodrine  5 mg Oral BID WC  . multivitamin  1 tablet Oral QHS  . neomycin-bacitracin-polymyxin  1 application Topical Daily  . polyethylene glycol  17 g Oral Daily  . sevelamer carbonate  800 mg Oral TID WC  . sodium chloride  3 mL Intravenous Q12H  . vancomycin  750 mg Intravenous Q M,W,F-HD

## 2014-12-13 NOTE — Progress Notes (Signed)
[facility] PROGRESS NOTE  Briana Gould ZOX:[id number] DOB: 06/01/1946 DOA: 12/07/2014 PCP: [name], MD  Assessment/Plan: Principal Problem:   Cellulitis Active Problems:   Diabetes mellitus with end stage renal disease   ESRD (end stage renal disease) on dialysis   PAD (peripheral artery disease)   CAD- severe 3V CAD at cath March 2015- medical Rx   CVA (cerebral infarction)   Leukocytosis   Severe peripheral vascular disease bilateral lower extremity critical limb ischemia/ulceration of the left below-knee stump  manifested by right necrotic heel wound and right lateral full thickness foot wound with exposed bone, cellulitis of right lower leg  Followed by vascular surgery not a candidate for open surgical bypass per Dr. Hart Rochester She has ABI of 0.2 with multilevel occlusive disease.  Per Dr [name] Only chance of revascularization of the R leg would be an endovascular procedure. Pt and family were going to consider proceeding with such. The patient is still undecided on proceeding. Realistically, she is looking at possible R AKA followed by a L AKA if she develops any complications. Continue broad-spectrum antibiotics vancomycin and cefepime over the next 3-5 days, day # 5-slowly improving hx of prior MSSA wound culture in Jan 2016. BC 3/30 - no growth to date MRI of the right lower extremity shows soft tissue ulcer  in the hindfoot Continue IV morphine when necessary, patient wants to minimize narcotic medications because of confusion    Primary hypertension/coronary artery disease/mild aortic stenosis Cardiology consultation completed Last 2-D echo October 2015 with an EF of 45-50% Extremely high risk of having another cardiac arrest Diffuse coronary artery disease and not a candidate for revascularization as per Dr. Excell Seltzer Repeat 2-D echo per cardiology, shows EF of 40-45%, essentially unchanged Continue beta blocker   ESRD - MWF - HD via AVF actually had  been going reasonably well all things considered, will plan for HD next Monday   Hypertension Increase midodrine 10 mg pre-hemodialysis, metoprolol on non hemodialysis days  Anemia per nephrology Hemoglobin stable  Diabetes mellitus Continue with NovoLog SSI Hemoglobin A1c 6.5   Code Status: full Family Communication: Discussed with the patient's daughter, Briana Gould, Briana Gould , several times today Disposition Plan:  Pending decision by vascular surgery    Brief narrative: This is a 69 y.o. female who we've been consulted on regarding bilateral leg wounds and cellulitis. She is accompanied by her daughter. She reports acute onset of redness and pain to her right lower leg since last night. She was also vomiting last night. She was recently seen in the office on 12/03/14 by Dr. [name] who declared her to have bilateral lower extremity critical limb ischemia. She reports a wound to her left below knee stump and two wounds on her right foot that started about six weeks ago. She has been going to the wound center for treatment. She reports some improvement in her wounds but was referred to Dr. [name] for evaluate circulation for wound healing. She has a PMH of three vessel CAD, AS, ESRD on HD, and DM II. She has also had three cardiac arrests during previous hospital admissions. Given her significant co-morbidities, she was deemed not to be a surgical candidate. Plans were made for arteriogram with bilateral runoff and intervention. According to her daughter, the patient was to have an MRI to evaluate for osteomyelitis of her right foot.  She's not had any episodes of chest pain. Her breathing is stable. She's had a slow gradual decline in health. She's had difficulty with  dialysis for the past couple days. They've not been able to pull as much fluid as they would like.  She's not had a stress Myoview study in a long time.  She had a she's had progressive cellulitis of her right lower leg for the  past several weeks. She's been seen by Dr. [name] for possible revascularization .   Her past 3 surgical procedures have been complicated by cardiac arrest and prolonged hospitalizations. She's been deemed not a surgical candidate for open revascularization. She may be a candidate for amputation. Hospital course-patient has received broad-spectrum antibiotics for the cellulitis in her right leg. Has had significant improvement clinically and in her white count,. Family still has to make a decision about amputation. Patient will probably need an endovascular procedure vs right AKA and left AKA. Decision is still pending.  Consultants:    Cardiology  Vascular surgery  Nephrology    Procedures:  Hemodialysis  Antibiotics: Vancomycin cefepime  HPI/Subjective: Complaining of some nausea this morning, otherwise stable overnight Had a fever of 100.6 yesterday evening  Objective: Filed Vitals:   12/12/14 1743 12/12/14 2140 12/13/14 0102 12/13/14 0521  BP: 167/76 105/46 156/69 110/43  Pulse:  111  87  Temp: 98.6 F (37 C) 100.6 F (38.1 C) 100.4 F (38 C) 99.1 F (37.3 C)  TempSrc: Oral Oral Oral Oral  Resp: Height:      Weight:      SpO2: 99% 97%  100%    Intake/Output Summary (Last 24 hours) at 12/13/14 0960 Last data filed at 12/13/14 4540  Gross per 24 hour  Intake    120 ml  Output   1928 ml  Net  -1808 ml    Exam:  General: No acute respiratory distress Lungs: Clear to auscultation bilaterally without wheezes or crackles Cardiovascular: Regular rate and rhythm without murmur gallop or rub normal S1 and S2 Abdomen: Nontender, nondistended, soft, bowel sounds positive, no rebound, no ascites, no appreciable mass Extremities: cellulitis of right lower extremity from below knee down to foot with blisters on upper thigh improving. Slight improvement in erythema. Necrotic right heel wound. Full thickness wound with exposed bone to right lateral foot. Left  below knee stump with ulceration at distal stump.      Data Reviewed: Basic Metabolic Panel:  Recent Labs Lab 12/07/14 1143 12/08/14 0630 12/09/14 0520 12/11/14 0727 12/13/14 0655  NA 134* 134* 135 133* 138  K 4.2 3.8 4.3 4.0 4.1  CL 94* 96 96 96 98  CO2 GLUCOSE 217* 207* 144* 139* 161*  BUN 50* 26* 45* 51* 51*  CREATININE 6.69* 4.05* 5.46* 5.21* 4.50*  CALCIUM 10.1 9.2 9.5 9.3 9.2    Liver Function Tests:  Recent Labs Lab 12/07/14 1143 12/09/14 0520 12/11/14 0727 12/13/14 0655  AST 25 46* 35 25  ALT ALKPHOS 118* 135* 111 115  BILITOT 1.3* 1.5* 1.2 1.4*  PROT 7.0 6.9 6.7 7.0  ALBUMIN 3.3* 2.8* 2.6* 2.5*   No results for input(s): LIPASE, AMYLASE in the last 168 hours. No results for input(s): AMMONIA in the last 168 hours.  CBC:  Recent Labs Lab 12/07/14 1143 12/08/14 0630 12/09/14 1500 12/11/14 0727 12/13/14 0655  WBC 16.6* 13.4* 18.4* 12.7* 12.9*  NEUTROABS 15.1*  --   --   --   --   HGB 11.3* 10.3* 11.6* 10.9* 10.4*  HCT 34.1* 30.9* 35.0* 33.2* 31.9*  MCV 96.1  95.7 99.4 96.2 97.3  PLT 225 202 213 202 293    Cardiac Enzymes: No results for input(s): CKTOTAL, CKMB, CKMBINDEX, TROPONINI in the last 168 hours. BNP (last 3 results) No results for input(s): BNP in the last 8760 hours.  ProBNP (last 3 results) No results for input(s): PROBNP in the last 8760 hours.    CBG:  Recent Labs Lab 12/12/14 0802 12/12/14 1234 12/12/14 1744 12/12/14 2213 12/13/14 0832  GLUCAP 115* 186* 160* 141* 166*    Recent Results (from the past 240 hour(s))  Culture, blood (routine x 2)     Status: None   Collection Time: 12/07/14 11:31 AM  Result Value Ref Range Status   Specimen Description BLOOD HAND RIGHT  Final   Special Requests BOTTLES DRAWN AEROBIC AND ANAEROBIC 5CC  Final   Culture   Final    NO GROWTH 5 DAYS [facility]formed at [facility]    Report Status 12/13/2014 FINAL  Final  Culture, blood (routine x 2)      Status: None   Collection Time: 12/07/14 11:43 AM  Result Value Ref Range Status   Specimen Description BLOOD RIGHT FOREARM  Final   Special Requests BOTTLES DRAWN AEROBIC AND ANAEROBIC 5CC  Final   Culture   Final    NO GROWTH 5 DAYS Performed at [facility]    Report Status 12/13/2014 FINAL  Final     Studies: Mr Foot Right Wo Contrast  12/08/2014   CLINICAL DATA:  Peripheral vascular disease. Ulceration along the lateral aspect of the heel.  EXAM: MRI OF THE RIGHT FOREFOOT WITHOUT CONTRAST  TECHNIQUE: Multiplanar, multisequence MR imaging was performed. No intravenous contrast was administered.  COMPARISON:  None.  FINDINGS: Peroneal: Peroneal longus tendon intact. Peroneal brevis intact.  Posteromedial: Posterior tibial tendon intact. Flexor hallucis longus tendon intact. Flexor digitorum longus tendon intact.  Anterior: Tibialis anterior tendon intact. Extensor hallucis longus tendon intact Extensor digitorum longus tendon intact.  Achilles: Intact.  Plantar Fascia: Intact.  LIGAMENTS  Medial: Deltoid ligament intact. Spring ligament intact.  Lateral: Anterior talofibular ligament intact. Calcaneofibular ligament intact. Posterior talofibular ligament intact. Anterior and posterior tibiofibular ligaments intact.  Ankle Joint: No joint effusion. No dislocation.  No chondral defect.  Subtalar Joint/Sinus Tarsi: No joint effusion. Normal subtalar joints. Normal sinus tarsi.  Bone/Soft tissue:  There is a soft tissue ulcer along the lateral aspect of the hindfoot. There is no adjacent focal fluid collection to suggest an abscess. There is soft tissue edema throughout the right foot and ankle.  There is no focal marrow signal abnormality within the calcaneus. There is no bone destruction or periosteal reaction.  There is a transverse linear signal abnormality involving the fifth metatarsal neck without surrounding marrow edema likely reflecting a healed or chronic ununited fracture. There  is no other marrow signal abnormality. 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  IMPRESSION: 1. Soft tissue ulcer along the lateral aspect of the hindfoot without a focal fluid collection to suggest an abscess. Soft tissue edema throughout the right foot and ankle which is nonspecific and may reflect reactive edema versus cellulitis. 2. No evidence of osteomyelitis of the right ankle.   Electronically Signed   By: Elige KoHetal  Patel   On: 12/08/2014 12:40   Dg Knee Complete 4 Views Left  11/17/2014   CLINICAL DATA:  Open wound.  Ulceration.  EXAM: LEFT KNEE - COMPLETE 4+ VIEW  COMPARISON:  None.  FINDINGS: Surgical clips noted over the left  lower extremity. Patient has had prior per lobe knee amputation. Peripheral vascular calcifications present. No evidence of effusion. Ulceration noted of the distal stomach. No acute or underlying bony abnormality.  IMPRESSION: 1. Left BKA. Ulceration of the soft tissues of the distal stump. No underlying bony abnormality identified. 2. Peripheral vascular disease.   Electronically Signed   By: Maisie Fus  Register   On: 11/17/2014 13:47   Dg Foot Complete Right  11/17/2014   CLINICAL DATA:  Right foot ulcer laterally  EXAM: RIGHT FOOT COMPLETE - 3+ VIEW  COMPARISON:  None.  FINDINGS: Leandra Kern is noted within the lateral soft tissues consistent with the patient's given clinical history. No underlying bony destruction is identified to suggest osteomyelitis at this time. Mild osteopenia is seen. Diffuse vascular calcifications are noted.  IMPRESSION: Soft tissue wound without definitive bony abnormality.   Electronically Signed   By: Alcide Clever M.D.   On: 11/17/2014 13:47    Scheduled Meds: . aspirin  81 mg Oral Daily  . atropine  1 application Left Eye Q1200  . calcitRIOL  1.25 mcg Oral Q M,W,F-HD  . ceFEPime (MAXIPIME) IV  2 g Intravenous Q M,W,F-HD  . collagenase  1 application Topical Once per day on Sun Tue Thu Sat  . docusate sodium  100 mg Oral BID   . famotidine  20 mg Oral QHS  . feeding supplement (PRO-STAT SUGAR FREE 64)  30 mL Oral TID  . heparin  5,000 Units Subcutaneous 3 times per day  . insulin aspart  0-5 Units Subcutaneous QHS  . insulin aspart  0-9 Units Subcutaneous TID WC  . metoCLOPramide  10 mg Oral TID AC & HS  . metoprolol tartrate  12.5 mg Oral Q M,W,F-HD  . midodrine  10 mg Oral Q M,W,F-HD  . midodrine  5 mg Oral BID WC  . multivitamin  1 tablet Oral QHS  . neomycin-bacitracin-polymyxin  1 application Topical Daily  . polyethylene glycol  17 g Oral Daily  . sevelamer carbonate  800 mg Oral TID WC  . sodium chloride  3 mL Intravenous Q12H  . 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 If 7PM-7AM, please contact night-coverage at [email or web address], password [facility] 12/13/2014, 9:27 AM  LOS: 6 days

## 2014-12-14 DIAGNOSIS — E1322 Other specified diabetes mellitus with diabetic chronic kidney disease: Secondary | ICD-10-CM

## 2014-12-14 DIAGNOSIS — R400 Somnolence: Secondary | ICD-10-CM

## 2014-12-14 LAB — RENAL FUNCTION PANEL
Albumin: 2.4 g/dL — ABNORMAL LOW (ref 3.5–5.2)
Anion gap: 14 (ref 5–15)
BUN: 89 mg/dL — ABNORMAL HIGH (ref 6–23)
CO2: 26 mmol/L (ref 19–32)
Calcium: 9.6 mg/dL (ref 8.4–10.5)
Chloride: 96 mmol/L (ref 96–112)
Creatinine, Ser: 6.16 mg/dL — ABNORMAL HIGH (ref 0.50–1.10)
GFR calc Af Amer: 7 mL/min — ABNORMAL LOW (ref >90)
GFR calc non Af Amer: 6 mL/min — ABNORMAL LOW (ref >90)
Glucose, Bld: 181 mg/dL — ABNORMAL HIGH (ref 70–99)
Phosphorus: 5.3 mg/dL — ABNORMAL HIGH (ref 2.3–4.6)
Potassium: 4.6 mmol/L (ref 3.5–5.1)
Sodium: 136 mmol/L (ref 135–145)

## 2014-12-14 LAB — BLOOD GAS, ARTERIAL
Acid-base deficit: 0.9 mmol/L (ref 0.0–2.0)
BICARBONATE: 22.6 meq/L (ref 20.0–24.0)
Drawn by: 283381
FIO2: 0.21 %
O2 SAT: 98.2 %
Patient temperature: 98.6
TCO2: 23.6 mmol/L (ref 0–100)
pCO2 arterial: 33 mmHg — ABNORMAL LOW (ref 35.0–45.0)
pH, Arterial: 7.449 (ref 7.350–7.450)
pO2, Arterial: 99.3 mmHg (ref 80.0–100.0)

## 2014-12-14 LAB — CBC
HCT: 27.3 % — ABNORMAL LOW (ref 36.0–46.0)
Hemoglobin: 9.2 g/dL — ABNORMAL LOW (ref 12.0–15.0)
MCH: 32.1 pg (ref 26.0–34.0)
MCHC: 33.7 g/dL (ref 30.0–36.0)
MCV: 95.1 fL (ref 78.0–100.0)
Platelets: 339 10*3/uL (ref 150–400)
RBC: 2.87 MIL/uL — ABNORMAL LOW (ref 3.87–5.11)
RDW: 17.5 % — ABNORMAL HIGH (ref 11.5–15.5)
WBC: 15.4 10*3/uL — ABNORMAL HIGH (ref 4.0–10.5)

## 2014-12-14 LAB — GLUCOSE, CAPILLARY
GLUCOSE-CAPILLARY: 154 mg/dL — AB (ref 70–99)
GLUCOSE-CAPILLARY: 113 mg/dL — AB (ref 70–99)
Glucose-Capillary: 166 mg/dL — ABNORMAL HIGH (ref 70–99)

## 2014-12-14 MED ORDER — NEPRO/CARBSTEADY PO LIQD
237.0000 mL | ORAL | Status: DC
  Administered 2014-12-16 – 2014-12-18 (×3): 237 mL via ORAL
  Filled 2014-12-14 (×8): qty 237

## 2014-12-14 MED ORDER — ACETAMINOPHEN 325 MG PO TABS
ORAL_TABLET | ORAL | Status: AC
Start: 2014-12-14 — End: 2014-12-14
  Filled 2014-12-14: qty 2

## 2014-12-14 NOTE — Progress Notes (Addendum)
NUTRITION FOLLOW UP  Intervention:   -Continue 30 ml Prostat TID, each supplement provides 100 kcals and 15 grams protein -Nepro Shake po daily, each supplement provides 425 kcal and 19 grams protein  Nutrition Dx:   Increased nutrient needs related to wound healing as evidenced by estimated needs; ongoing  Goal:   Pt will meet >90% of estimated nutritional needs; unmet  Monitor:   PO/supplement intake, labs, weight changes, I/O's  Assessment:   Briana Gould is a 69 y.o. female  With multiple chronic medical issues. She came to the ER after she was too weak to go to dialysis. Her right leg had an ulcer with spreading redness.  She was seen by Dr. [name] 3/24 with right heel ulcer, referred by her wound care center. She developed the wound about 6 weeks ago and it has gradually become larger. She has been going to wound care at [facility]. Dr. [name] stated that he doubts she is a surgical candidate as she has coded 3 times during previous procedures. He recommends an aortogram, bilateral runoff and intervention.  Wound care doctor was sending to get MRI to r/o osteomyelitis  Staff report that pt has been very lethargic today. Multiple staff members in with pt at time of visit.  Noted that intake has been fair to poor- 25-50% meal completion. Lethargy due to pain medication is a contributing factor to poor po intake.  Pt remains on aggressive antibiotic therapy. Vascular surgery is following closely. Pt is scheduled for a LE arteriogram on 12/15/14. Pt and family are contemplating need for amputation pending further work-up and response to antibiotics. Pt remains on Prostat supplement, which she is taking. Also noted orders for PRN Nepro- will change to scheduled to help maximize PO intake in attempt to help facilitate wound healing.  Labs reviewed. BUN/Creat: 51/4.50, Glucose: 161, CBGS: 138-166.   Height: Ht Readings from Last 1 Encounters:  12/07/14  (1.651 m)     Weight Status:   Wt Readings from Last 1 Encounters:  12/14/14 165 lb 12.6 oz (75.2 kg)   12/07/14 164 lb 0.4 oz (74.4 kg)       Re-estimated needs:  Kcal: 2300-2500 Protein: 135-145 grams Fluid: >1.5 L  Skin: 2 full thickness wounds on lt stump, rt outer thigh ttickness burn, unstageable wound on rt heel, full thickness wounds on rt outer ankle.  Diet Order: Diet renal/carb modified with fluid restriction Diet-HS Snack?: Nothing; Room service appropriate?: Yes; Fluid consistency:: Thin Diet NPO time specified Except for: Sips with Meds Diet NPO time specified Except for: Sips with Meds   Intake/Output Summary (Last 24 hours) at 12/14/14 1625 Last data filed at 12/14/14 1010  Gross per 24 hour  Intake    150 ml  Output      0 ml  Net    150 ml    Last BM: 12/14/14   Labs:   Recent Labs Lab 12/09/14 0520 12/11/14 0727 12/13/14 0655  NA 135 133* 138  K 4.3 4.0 4.1  CL 96 96 98  CO2 BUN 45* 51* 51*  CREATININE 5.46* 5.21* 4.50*  CALCIUM 9.5 9.3 9.2  GLUCOSE 144* 139* 161*    CBG (last 3)   Recent Labs  12/13/14 2158 12/14/14 0746 12/14/14 1240  GLUCAP 138* 154* 166*    Scheduled Meds: . aspirin  81 mg Oral Daily  . atropine  1 application Left Eye Q1200  . calcitRIOL  1.25 mcg Oral  Q M,W,F-HD  . ceFEPime (MAXIPIME) IV  2 g Intravenous Q M,W,F-HD  . collagenase  1 application Topical Once per day on Sun Tue Thu Sat  . docusate sodium  100 mg Oral BID  . famotidine  20 mg Oral QHS  . feeding supplement (PRO-STAT SUGAR FREE 64)  30 mL Oral TID  . heparin  5,000 Units Subcutaneous 3 times per day  . insulin aspart  0-5 Units Subcutaneous QHS  . insulin aspart  0-9 Units Subcutaneous TID WC  . metoprolol tartrate  12.5 mg Oral Q M,W,F-HD  . midodrine  10 mg Oral Q M,W,F-HD  . midodrine  5 mg Oral BID WC  . multivitamin  1 tablet Oral QHS  . neomycin-bacitracin-polymyxin  1 application Topical Daily  . polyethylene glycol  17 g Oral  Daily  . sevelamer carbonate  800 mg Oral TID WC  . sodium chloride  3 mL Intravenous Q12H  . vancomycin  750 mg Intravenous Q M,W,F-HD    Continuous Infusions:   Naol Ontiveros A. Mayford KnifeWilliams, RD, LDN, CDE Pager: [phone] After hours Pager: [phone]

## 2014-12-14 NOTE — Progress Notes (Signed)
ANTIBIOTIC CONSULT NOTE - FOLLOW UP  Pharmacy Consult for Vancomycin/Cefepime Indication: Cellulitis  Allergies  Allergen Reactions  . Phenergan [Promethazine Hcl] Other (See Comments)    Causes patient to" vomit more"  . Vitamin D Analogs Rash    She was able to take Hectorol 07/2014 admission and is now tolerating calcitriol 11/2014   Labs:  Recent Labs  12/13/14 0655  WBC 12.9*  HGB 10.4*  PLT 293  CREATININE 4.50*   Estimated Creatinine Clearance: 11.9 mL/min (by C-G formula based on Cr of 4.5). No results for input(s): VANCOTROUGH, VANCOPEAK, VANCORANDOM, GENTTROUGH, GENTPEAK, GENTRANDOM, TOBRATROUGH, TOBRAPEAK, TOBRARND, AMIKACINPEAK, AMIKACINTROU, AMIKACIN in the last 72 hours.    Assessment: 69 year old female continues on Vancomycin/Cefepime Day # 8 for non-healing ulcers ESRD -- HD MWF  Plan for aortogram and possible right leg intervention Thursday Blood cultures negative  Goal of Therapy:  Vanco level <20 prior to redosing post-HD  Plan:  Continue Cefepime to 2g IV qHD MWF Continue Vancomycin 750mg  IV q HD MWF  Thank you. Okey RegalLisa Amanda Steuart, PharmD [phone]  Briana SleightPowell, Briana Gould Kay 12/14/2014,11:00 AM

## 2014-12-14 NOTE — Progress Notes (Signed)
PROGRESS NOTE  Briana Gould ZOX:[id number] DOB: [birth year] DOA: 12/07/2014 PCP: Briana Gould,Briana Gould, Briana Gould  HPI/Recap of past 4424 hours: 69 year old female with past medical history of severe peripheral vascular disease, end-stage renal disease on hemodialysis, aortic stenosis and diabetes mellitus who has previous history of bilateral critical limb ischemia and status post left BKA and this the past felt to be a non-operative candidate due to high risk. Patient brought in for nausea and vomiting and found to have severe cellulitis.  Started on broad-spectrum antibiotics. Seen by nephrology who continue to manage dialysis. Seen by vascular surgery. MRI ruled out osteomyelitis. After discussion with faster surgery, plan is for aortogram, bilateral leg runoff and likely patient will need endovascular intervention. Patient has been having more issues with some delirium and somnolence.  Assessment/Plan: Principal Problem:   Cellulitis secondary to severe peripheral vascular disease. Patient on continued broad-spectrum antibiotics and white count initially improved, but now worsening again.  Acute encephalopathy: Somnolent Active Problems:   Diabetes mellitus with end stage renal disease: Overall CBG stable   ESRD (end stage renal disease) on dialysis: Continue management as per dialysis  Somnolence: Medications reviewed, 60 more regard to patient'Gould delirium which may be from underlying infection. Antibiotics don't improve infection so much as patient is vascular intervention.    Code Status: Full code  Family Communication: Sister present  Disposition Plan:    Consultants:  Nephrology  Vascular surgery  Cardiology  Procedures:  Planned aortogram  Hemodialysis as per nephrology  Antibiotics:  IV vancomycin 4/1-present  IV Zosyn 4/1-present   Objective: BP 147/110 mmHg  Pulse 80  Temp(Src) 98.9 F (37.2 C) (Oral)  Resp 18  Ht 5\' 5"  (1.651 m)  Wt 75.2 kg (165 lb 12.6 oz)   BMI 27.59 kg/m2  SpO2 100%  Intake/Output Summary (Last 24 hours) at 12/14/14 1836 Last data filed at 12/14/14 1010  Gross per 24 hour  Intake    150 ml  Output      0 ml  Net    150 ml   Filed Weights   12/12/14 1312 12/12/14 1657 12/14/14 1547  Weight: 75.7 kg (166 lb 14.2 oz) 73.7 kg (162 lb 7.7 oz) 75.2 kg (165 lb 12.6 oz)    Exam:   General:  Somnolent, confused  Cardiovascular: 3/6 holosystolic murmur  Respiratory: Clear to auscultation bilaterally  Abdomen: Soft, nontender, nondistended, positive bowel sounds  Musculoskeletal: Right lower extremity with secondary blistering and cellulitis and heel wound stage IV. Patient noted to have ulceration of left below-the-knee stump   Data Reviewed: Basic Metabolic Panel:  Recent Labs Lab 12/08/14 0630 12/09/14 0520 12/11/14 0727 12/13/14 0655 12/14/14 1559  NA 134* 135 133* 138 136  K 3.8 4.3 4.0 4.1 4.6  CL 96 96 96 98 96  CO2 26 24 23 28 26   GLUCOSE 207* 144* 139* 161* 181*  BUN 26* 45* 51* 51* 89*  CREATININE 4.05* 5.46* 5.21* 4.50* 6.16*  CALCIUM 9.2 9.5 9.3 9.2 9.6  PHOS  --   --   --   --  5.3*   Liver Function Tests:  Recent Labs Lab 12/09/14 0520 12/11/14 0727 12/13/14 0655 12/14/14 1559  AST 46* 35 25  --   ALT 25 20 16   --   ALKPHOS 135* 111 115  --   BILITOT 1.5* 1.2 1.4*  --   PROT 6.9 6.7 7.0  --   ALBUMIN 2.8* 2.6* 2.5* 2.4*   No results for input(Gould): LIPASE, AMYLASE in  the last 168 hours. No results for input(Gould): AMMONIA in the last 168 hours. CBC:  Recent Labs Lab 12/08/14 0630 12/09/14 1500 12/11/14 0727 12/13/14 0655 12/14/14 1559  WBC 13.4* 18.4* 12.7* 12.9* 15.4*  HGB 10.3* 11.6* 10.9* 10.4* 9.2*  HCT 30.9* 35.0* 33.2* 31.9* 27.3*  MCV 95.7 99.4 96.2 97.3 95.1  PLT 202 213 202 293 339   Cardiac Enzymes:   No results for input(Gould): CKTOTAL, CKMB, CKMBINDEX, TROPONINI in the last 168 hours. BNP (last 3 results) No results for input(Gould): BNP in the last 8760  hours.  ProBNP (last 3 results) No results for input(Gould): PROBNP in the last 8760 hours.  CBG:  Recent Labs Lab 12/13/14 1213 12/13/14 1710 12/13/14 2158 12/14/14 0746 12/14/14 1240  GLUCAP 160* 201* 138* 154* 166*    Recent Results (from the past 240 hour(Gould))  Culture, blood (routine x 2)     Status: None   Collection Time: 12/07/14 11:31 AM  Result Value Ref Range Status   Specimen Description BLOOD HAND RIGHT  Final   Special Requests BOTTLES DRAWN AEROBIC AND ANAEROBIC 5CC  Final   Culture   Final    NO GROWTH 5 DAYS Performed at [facility]    Report Status 12/13/2014 FINAL  Final  Culture, blood (routine x 2)     Status: None   Collection Time: 12/07/14 11:43 AM  Result Value Ref Range Status   Specimen Description BLOOD RIGHT FOREARM  Final   Special Requests BOTTLES DRAWN AEROBIC AND ANAEROBIC 5CC  Final   Culture   Final    NO GROWTH 5 DAYS Performed at [facility]    Report Status 12/13/2014 FINAL  Final     Studies: No results found.  Scheduled Meds: . acetaminophen      . aspirin  81 mg Oral Daily  . atropine  1 application Left Eye Q1200  . calcitRIOL  1.25 mcg Oral Q M,W,F-HD  . ceFEPime (MAXIPIME) IV  2 g Intravenous Q M,W,F-HD  . collagenase  1 application Topical Once per day on Sun Tue Thu Sat  . docusate sodium  100 mg Oral BID  . famotidine  20 mg Oral QHS  . feeding supplement (NEPRO CARB STEADY)  237 mL Oral Q24H  . feeding supplement (PRO-STAT SUGAR FREE 64)  30 mL Oral TID  . heparin  5,000 Units Subcutaneous 3 times per day  . insulin aspart  0-5 Units Subcutaneous QHS  . insulin aspart  0-9 Units Subcutaneous TID WC  . metoprolol tartrate  12.5 mg Oral Q M,W,F-HD  . midodrine  10 mg Oral Q M,W,F-HD  . midodrine  5 mg Oral BID WC  . multivitamin  1 tablet Oral QHS  . neomycin-bacitracin-polymyxin  1 application Topical Daily  . polyethylene glycol  17 g Oral Daily  . sevelamer carbonate  800 mg Oral TID WC  .  sodium chloride  3 mL Intravenous Q12H  . vancomycin  750 mg Intravenous Q M,W,F-HD    Continuous Infusions:    Time spent: 25 minutes  Briana Gould  [facility] Pager [phone]. If 7PM-7AM, please contact night-coverage at [email or web address], password [facility] 12/14/2014, 6:36 PM  LOS: 7 days

## 2014-12-14 NOTE — Evaluation (Signed)
Physical Therapy Evaluation Patient Details Name: Briana Gould MRN: [id number] DOB: 07-06-1946 Today's Date: 12/14/2014   History of Present Illness  pt admittesd after she was too weak to go to dialysis.  In ER R LE showed wouunds and cellulitic redness..  Evaluation limited due to profound lethargy.  Clinical Impression  Pt admitted with/for weakness and s/s of cellulitis and ischemia R LE.  Pt currently limited functionally due to the problems listed. ( See problems list.)   Pt will benefit from PT to maximize function and safety in order to get ready for next venue listed below.      Follow Up Recommendations SNF    Equipment Recommendations       Recommendations for Other Services       Precautions / Restrictions Precautions Precautions: Fall Restrictions Weight Bearing Restrictions: No      Mobility  Bed Mobility Overal bed mobility: Needs Assistance Bed Mobility: Supine to Sit     Supine to sit: Total assist     General bed mobility comments: pt assisted very minimally due to extreme lethargy  Transfers Overall transfer level: Needs assistance   Transfers: Squat Pivot Transfers     Squat pivot transfers: Total assist     General transfer comment: little effort given by pt  Ambulation/Gait                Stairs            Wheelchair Mobility    Modified Rankin (Stroke Patients Only)       Balance Overall balance assessment: Needs assistance Sitting-balance support: No upper extremity supported;Single extremity supported Sitting balance-Leahy Scale: Fair Sitting balance - Comments: able to sit EOB without assist and make minute postural changes to mantain sitting, but with any challenge falls over                                     Pertinent Vitals/Pain Pain Assessment: Faces Faces Pain Scale: Hurts even more Pain Location: legs Pain Descriptors / Indicators: Sore    Home Living Family/patient expects to be  discharged to:: Private residence Living Arrangements: Other relatives (grandchildren 469,11, and 69 y/o) Available Help at Discharge: Family;Personal care attendant (PCA 8-5 M-F or M-Sunday) Type of Home: House Home Access: Ramped entrance     Home Layout: One level Home Equipment: Wheelchair - manual Additional Comments: Rn shared that pt's husband recently passed away.    Prior Function                 Hand Dominance        Extremity/Trunk Assessment   Upper Extremity Assessment: Generalized weakness           Lower Extremity Assessment: Generalized weakness      Cervical / Trunk Assessment: Kyphotic  Communication      Cognition Arousal/Alertness: Lethargic Behavior During Therapy: Flat affect Overall Cognitive Status: Difficult to assess                      General Comments      Exercises        Assessment/Plan    PT Assessment Patient needs continued PT services  PT Diagnosis Generalized weakness   PT Problem List Decreased strength;Decreased activity tolerance;Decreased balance;Decreased mobility;Pain  PT Treatment Interventions DME instruction;Functional mobility training;Therapeutic activities;Balance training;Patient/family education   PT Goals (Current goals can be found in the  Care Plan section) Acute Rehab PT Goals Patient Stated Goal: pt unable to participate PT Goal Formulation: Patient unable to participate in goal setting Time For Goal Achievement: 12/28/14 Potential to Achieve Goals: Fair    Frequency Min 3X/week   Barriers to discharge        Co-evaluation               End of Session   Activity Tolerance: Patient limited by lethargy Patient left: in chair;with call bell/phone within reach Nurse Communication: Mobility status;Need for lift equipment         Time: 1610-9604 PT Time Calculation (min) (ACUTE ONLY): 25 min   Charges:   PT Evaluation $Initial PT Evaluation Tier I: 1 Procedure PT  Treatments $Therapeutic Activity: 8-22 mins   PT G Codes:        Esau Fridman, Eliseo Gum 12/14/2014, 10:50 AM 12/14/2014  [place] Bing, PT [phone]  (pager)

## 2014-12-14 NOTE — Progress Notes (Signed)
[facility] Progress Note   Subjective: tearful, "is this it? "  . Also can't stay awake.  Filed Vitals:   12/13/14 0521 12/13/14 1315 12/13/14 2211 12/14/14 0631  BP: 110/43 140/64 122/59 137/51  Pulse: 87 86 82   Temp: 99.1 F (37.3 C) 98.9 F (37.2 C) 98.8 F (37.1 C) 98.8 F (37.1 C)  TempSrc: Oral Oral Oral Oral  Resp: 18  16 16   Height:      Weight:      SpO2: 100% 99% 97% 92%   Exam: Somnolent, slurred speech, no distress No jvd Chest mostly clear occ rales at bases RRR 2/6 murmur Abd soft, NTND, no mass or ascites R foot wrapped, R leg no erythema, minimal edema L BKA wrapped LUA AVF patent Neuro nonfocal, up in chair, somnolent, no myoclonic jerking  HD: [name] MWF 4h 72.5kg 2/2 Bath LUA AVF Heparin 5000 Mircera 75 q 4 wks last 3/23, Venofer 100 / HD, Calcitriol 1.25 ug TIW Pth 658 declining        Assessment: 1. Cellulitis RLE / R foot ulcers - on vanc/cefipime, MRI neg for osteo.  VVS following 2. AMS - lethargic, not sure cause only had one oxy yesterday and is on po reglan 3. ESRD HD MWF 4. Chronic hypotension - on midodrine 5. Volume is close to dry wt, no gross excess 6. Anemia cont ESA 7. MBD cont meds 8. 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 DM  Plan - HD today , stopped reglan/ oxycodone for ^'d somnolence    Briana Moselleob Briana Tuckerman MD  pager [phone]    cell [phone]  12/14/2014, 11:12 AM     Recent Labs Lab 12/09/14 0520 12/11/14 0727 12/13/14 0655  NA 135 133* 138  K 4.3 4.0 4.1  CL 96 96 98  CO2 24 23 28   GLUCOSE 144* 139* 161*  BUN 45* 51* 51*  CREATININE 5.46* 5.21* 4.50*  CALCIUM 9.5 9.3 9.2    Recent Labs Lab 12/09/14 0520 12/11/14 0727 12/13/14 0655  AST 46* 35 25  ALT 25 20 16   ALKPHOS 135* 111 115  BILITOT 1.5* 1.2 1.4*  PROT 6.9 6.7 7.0  ALBUMIN 2.8* 2.6* 2.5*    Recent Labs Lab 12/07/14 1143  12/09/14 1500 12/11/14 0727 12/13/14 0655  WBC 16.6*  < > 18.4* 12.7* 12.9*   NEUTROABS 15.1*  --   --   --   --   HGB 11.3*  < > 11.6* 10.9* 10.4*  HCT 34.1*  < > 35.0* 33.2* 31.9*  MCV 96.1  < > 99.4 96.2 97.3  PLT 225  < > 213 202 293  < > = values in this interval not displayed. Marland Kitchen. aspirin  81 mg Oral Daily  . atropine  1 application Left Eye Q1200  . calcitRIOL  1.25 mcg Oral Q M,W,F-HD  . ceFEPime (MAXIPIME) IV  2 g Intravenous Q M,W,F-HD  . collagenase  1 application Topical Once per day on Sun Tue Thu Sat  . docusate sodium  100 mg Oral BID  . famotidine  20 mg Oral QHS  . feeding supplement (PRO-STAT SUGAR FREE 64)  30 mL Oral TID  . heparin  5,000 Units Subcutaneous 3 times per day  . insulin aspart  0-5 Units Subcutaneous QHS  . insulin aspart  0-9 Units Subcutaneous TID WC  . metoCLOPramide  10 mg Oral TID AC & HS  . metoprolol tartrate  12.5 mg Oral Q M,W,F-HD  .  midodrine  10 mg Oral Q M,W,F-HD  . midodrine  5 mg Oral BID WC  . multivitamin  1 tablet Oral QHS  . neomycin-bacitracin-polymyxin  1 application Topical Daily  . polyethylene glycol  17 g Oral Daily  . sevelamer carbonate  800 mg Oral TID WC  . sodium chloride  3 mL Intravenous Q12H  . vancomycin  750 mg Intravenous Q M,W,F-HD     sodium chloride, acetaminophen **OR** acetaminophen, feeding supplement (NEPRO CARB STEADY), morphine injection, ondansetron **OR** ondansetron (ZOFRAN) IV, oxyCODONE, sodium chloride

## 2014-12-14 NOTE — Progress Notes (Signed)
OT Cancellation Note  Patient Details Name: Rayleigh Barbaraann BoysS Rekowski MRN: [id number] DOB: 04/10/1946   Cancelled Treatment:    Reason Eval/Treat Not Completed: Other (comment). Per PT, pt not appropriate to see at this time due to lethargy.   Earlie RavelingStraub, Gay Rape L OTR/L [phone] 12/14/2014, 10:40 AM

## 2014-12-15 ENCOUNTER — Ambulatory Visit (HOSPITAL_COMMUNITY): Admit: 2014-12-15 | Payer: Self-pay | Admitting: Vascular Surgery

## 2014-12-15 ENCOUNTER — Encounter (HOSPITAL_COMMUNITY)
Admission: EM | Disposition: A | Discharge status: Hospice | Payer: Self-pay | Source: Home / Self Care | Attending: Internal Medicine

## 2014-12-15 ENCOUNTER — Encounter (HOSPITAL_COMMUNITY): Payer: Self-pay | Admitting: Vascular Surgery

## 2014-12-15 DIAGNOSIS — G9340 Encephalopathy, unspecified: Secondary | ICD-10-CM | POA: Diagnosis present

## 2014-12-15 DIAGNOSIS — D72829 Elevated white blood cell count, unspecified: Secondary | ICD-10-CM

## 2014-12-15 HISTORY — PX: ABDOMINAL AORTAGRAM: SHX5454

## 2014-12-15 LAB — CREATININE, SERUM
CREATININE: 4.82 mg/dL — AB (ref 0.50–1.10)
GFR calc Af Amer: 10 mL/min — ABNORMAL LOW (ref >90)
GFR, EST NON AFRICAN AMERICAN: 8 mL/min — AB (ref >90)

## 2014-12-15 LAB — GLUCOSE, CAPILLARY
GLUCOSE-CAPILLARY: 135 mg/dL — AB (ref 70–99)
GLUCOSE-CAPILLARY: 134 mg/dL — AB (ref 70–99)
GLUCOSE-CAPILLARY: 124 mg/dL — AB (ref 70–99)
Glucose-Capillary: 136 mg/dL — ABNORMAL HIGH (ref 70–99)

## 2014-12-15 LAB — BASIC METABOLIC PANEL
Anion gap: 17 — ABNORMAL HIGH (ref 5–15)
BUN: 43 mg/dL — ABNORMAL HIGH (ref 6–23)
CO2: 21 mmol/L (ref 19–32)
Calcium: 9.3 mg/dL (ref 8.4–10.5)
Chloride: 97 mmol/L (ref 96–112)
Creatinine, Ser: 4.05 mg/dL — ABNORMAL HIGH (ref 0.50–1.10)
GFR, EST AFRICAN AMERICAN: 12 mL/min — AB (ref >90)
GFR, EST NON AFRICAN AMERICAN: 10 mL/min — AB (ref >90)
Glucose, Bld: 136 mg/dL — ABNORMAL HIGH (ref 70–99)
Potassium: 4.1 mmol/L (ref 3.5–5.1)
SODIUM: 135 mmol/L (ref 135–145)

## 2014-12-15 LAB — CBC
HCT: 26.6 % — ABNORMAL LOW (ref 36.0–46.0)
HEMATOCRIT: 26.3 % — AB (ref 36.0–46.0)
Hemoglobin: 8.6 g/dL — ABNORMAL LOW (ref 12.0–15.0)
Hemoglobin: 8.8 g/dL — ABNORMAL LOW (ref 12.0–15.0)
MCH: 31.7 pg (ref 26.0–34.0)
MCH: 31.9 pg (ref 26.0–34.0)
MCHC: 32.7 g/dL (ref 30.0–36.0)
MCHC: 33.1 g/dL (ref 30.0–36.0)
MCV: 97 fL (ref 78.0–100.0)
MCV: 96.4 fL (ref 78.0–100.0)
Platelets: 297 10*3/uL (ref 150–400)
Platelets: 336 10*3/uL (ref 150–400)
RBC: 2.71 MIL/uL — AB (ref 3.87–5.11)
RBC: 2.76 MIL/uL — ABNORMAL LOW (ref 3.87–5.11)
RDW: 17.9 % — ABNORMAL HIGH (ref 11.5–15.5)
RDW: 18.1 % — AB (ref 11.5–15.5)
WBC: 16.1 10*3/uL — AB (ref 4.0–10.5)
WBC: 17.4 10*3/uL — AB (ref 4.0–10.5)

## 2014-12-15 LAB — AMMONIA: AMMONIA: 36 umol/L — AB (ref 11–32)

## 2014-12-15 SURGERY — ABDOMINAL AORTAGRAM
Anesthesia: LOCAL

## 2014-12-15 MED ORDER — SODIUM CHLORIDE 0.9 % IJ SOLN: 3.0000 mL | INTRAMUSCULAR | Status: DC | PRN

## 2014-12-15 MED ORDER — MORPHINE SULFATE 2 MG/ML IJ SOLN
2.0000 mg | INTRAMUSCULAR | Status: DC | PRN
  Administered 2014-12-16: 2 mg via INTRAVENOUS
  Filled 2014-12-15 (×2): qty 1

## 2014-12-15 MED ORDER — OXYCODONE-ACETAMINOPHEN 5-325 MG PO TABS: 1.0000 | ORAL_TABLET | ORAL | Status: DC | PRN

## 2014-12-15 MED ORDER — ACETAMINOPHEN 325 MG PO TABS
650.0000 mg | ORAL_TABLET | ORAL | Status: DC | PRN
  Administered 2014-12-14: 19:00:00 via ORAL
  Administered 2014-12-15: 650 mg via ORAL

## 2014-12-15 MED ORDER — SODIUM CHLORIDE 0.9 % IJ SOLN
3.0000 mL | Freq: Two times a day (BID) | INTRAMUSCULAR | Status: DC
  Administered 2014-12-15 – 2014-12-21 (×9): 3 mL via INTRAVENOUS

## 2014-12-15 MED ORDER — SODIUM CHLORIDE 0.9 % IV SOLN: 250.0000 mL | INTRAVENOUS | Status: DC | PRN

## 2014-12-15 MED ORDER — HEPARIN SODIUM (PORCINE) 5000 UNIT/ML IJ SOLN: 5000.0000 [IU] | Freq: Three times a day (TID) | INTRAMUSCULAR | Status: DC

## 2014-12-15 MED ORDER — ACETAMINOPHEN 325 MG PO TABS
ORAL_TABLET | ORAL | Status: AC
  Filled 2014-12-15: qty 2

## 2014-12-15 MED ORDER — LIDOCAINE HCL (PF) 1 % IJ SOLN
INTRAMUSCULAR | Status: AC
  Filled 2014-12-15: qty 30

## 2014-12-15 MED ORDER — ONDANSETRON HCL 4 MG/2ML IJ SOLN: 4.0000 mg | Freq: Four times a day (QID) | INTRAMUSCULAR | Status: DC | PRN

## 2014-12-15 MED ORDER — HEPARIN (PORCINE) IN NACL 2-0.9 UNIT/ML-% IJ SOLN
INTRAMUSCULAR | Status: AC
  Filled 2014-12-15: qty 1000

## 2014-12-15 NOTE — Progress Notes (Signed)
Attempted to call report to RN Briana Gould, left my number with unit secretary and was told she would call me back.

## 2014-12-15 NOTE — Interval H&P Note (Signed)
[facility]  History and Physical Update  The patient was interviewed and re-examined.  The patient's previous History and Physical has been reviewed and is unchanged from Dr. Hart RochesterLawson consult except for: interval improvement in R lower leg celluitis.  There is no change in the plan of care: aortogram, bilateral leg runoff, and possible right leg intervention.  I discussed with the patient the nature of angiographic procedures, especially the limited patencies of any endovascular intervention.  The patient is aware of that the risks of an angiographic procedure include but are not limited to: bleeding, infection, access site complications, renal failure, embolization, rupture of vessel, dissection, possible need for emergent surgical intervention, possible need for surgical procedures to treat the patient's pathology, anaphylactic reaction to contrast, and stroke and death.  The patient is aware of the risks and agrees to proceed.   Briana SakeBrian Rasheen Bells, MD [facility] [place] Office: [phone] Pager: [phone]  12/15/2014, 11:20 AM

## 2014-12-15 NOTE — Progress Notes (Signed)
Site area: right groin a 5 french arterial sheath removed  Site Prior to Removal:  Level 0  Pressure Applied For 15 MINUTES    Minutes Beginning at 1300p  Manual:   Yes.    Patient Status During Pull:  stable  Post Pull Groin Site:  Level 0  Post Pull Instructions Given:  Yes.    Post Pull Pulses Present:  Yes.    Dressing Applied:  Yes.    Comments:  VS remain stable during sheath pull.  Pt denies any discomfort at groin site at this time.  Pt medicated with Tylenol 650 mg PO for lower leg pain

## 2014-12-15 NOTE — H&P (View-Only) (Signed)
[facility] Consult  Reason for Consult:  Bilateral leg wounds Referring Physician:  EDP MRN #:  [id number]  History of Present Illness: This is a 69 y.o. female who we've been consulted on regarding bilateral leg wounds and cellulitis. She is accompanied by her daughter.  She reports acute onset of redness and pain to her right lower leg since last night. She was also vomiting last night. She was recently seen in the office on 12/03/14 by Dr. Chen who declared her to have bilateral lower extremity critical limb ischemia. She reports a wound to her left below knee stump and two wounds on her right foot that started about six weeks ago. She has been going to the wound center for treatment. She reports some improvement in her wounds but was referred to Dr. Chen for evaluate circulation for wound healing. She has a PMH of three vessel CAD, AS, ESRD on HD, and DM II. She has also had three cardiac arrests during previous hospital admissions. Given her significant co-morbidities, she was deemed not to be a surgical candidate. Plans were made for arteriogram with bilateral runoff and intervention.  According to her daughter, the patient was to have an MRI to evaluate for osteomyelitis of her right foot.   Past Medical History  Diagnosis Date  . PAD (peripheral artery disease)     a. L BKA  . Type II diabetes mellitus   . Anemia   . History of blood transfusion 2002    "related to amputation" (08/13/2013)  . End stage renal disease on dialysis     "[facility]; M, W, F" (08/13/2013)  . DVT (deep venous thrombosis)     a. R IJ DVT 08/2013.  . CVA (cerebral infarction)     a. MRI 08/2013: possible left motor strip infarct with remote pontine infarct.  . Pulmonary HTN     a. Cath 2011 - mod to severe pulmonary hypertension. b. Echo 08/2013: PAP 45mmHg (mild-mod increased).  . Thrombocytopenia   . CAD (coronary artery disease)     a. Severe diffuse diabetic  disease by cath 2011, for med rx, not a candidate for CABG or PCI at that time.  . Aortic stenosis     mild  . Syncope     a. 2011, etiology never clear.  . Carotid artery disease     a. Dopp 08/2013: 1-39% LICA< 60-79% RICA borderline >80%.  . Cerebrovascular disease     a. 08/2013 MRA brain: multiple bilateral moderate and high grade intracranial stenoses  . Cardiac arrest 08/14/13    a. Possible arrest in setting of hypotension during dialysis with possible arrest requiring brief CPR, not intubated and was fully intact after episode. Suspected due to low cerebral perfusion in setting of hypotension. Unclear if any arrhythmia.  . End stage renal disease on dialysis   . Peripheral arterial occlusive disease   . Hypertension   . Diabetes mellitus type 2, uncontrolled, with complications   . DVT (deep venous thrombosis)      Right internal jugular vein  . CAD (coronary artery disease), native coronary artery 2011    Diffuse distal vessel CAD noted at cardiac catheterization  . MI (myocardial infarction)   . Stroke   . GIB (gastrointestinal bleeding)    Past Surgical History  Procedure Laterality Date  . Av fistula placement Left 2011    upper arm  . Below knee leg amputation Left 2002  . Tonsillectomy    .   Cataract extraction w/ intraocular lens  implant, bilateral Bilateral 2012-2014  . Esophagogastroduodenoscopy N/A 08/30/2013    Procedure: ESOPHAGOGASTRODUODENOSCOPY (EGD);  Surgeon: Patrick D Hung, MD;  Location: [facility] ENDOSCOPY;  Service: Endoscopy;  Laterality: N/A;  . Below knee leg amputation Left   . Av fistula placement Left   . Left and right heart catheterization with coronary angiogram Right 11/22/2013    Procedure: LEFT AND RIGHT HEART CATHETERIZATION WITH CORONARY ANGIOGRAM;  Surgeon: Peter M Jordan, MD;  Location: [facility] CATH LAB;  Service: Cardiovascular;  Laterality: Right;    Allergies  Allergen Reactions  . Phenergan [Promethazine Hcl] Other (See Comments)    Causes  patient to" vomit more"  . Vitamin D Analogs Rash    She was able to take Hectorol 07/2014 admission and is now tolerating calcitriol 11/2014    Prior to Admission medications   Medication Sig Start Date End Date Taking? Authorizing Provider  acetaminophen (TYLENOL) 500 MG tablet Take 500 mg by mouth every 6 (six) hours as needed for mild pain or moderate pain.   Yes Historical Provider, MD  aspirin 81 MG chewable tablet Chew 1 tablet (81 mg total) by mouth daily. 08/19/14  Yes Daniel J Angiulli, PA-C  atropine 1 % ophthalmic ointment Place 1 application into the left eye daily at 12 noon.   Yes Historical Provider, MD  b complex-vitamin c-folic acid (NEPHRO-VITE) 0.8 MG TABS tablet Take 1 tablet by mouth daily.   Yes Historical Provider, MD  collagenase (SANTYL) ointment Apply 1 application topically every other day. Apply with dressing change to right foot   Yes Historical Provider, MD  glimepiride (AMARYL) 2 MG tablet TAKE 1 TABLET EVERY DAY WITH BREAKFAST 10/31/14  Yes Zachary T Swartz, MD  metoCLOPramide (REGLAN) 10 MG tablet Take 1 tablet (10 mg total) by mouth 4 (four) times daily -  before meals and at bedtime. Patient taking differently: Take 10 mg by mouth 3 (three) times daily before meals.  08/19/14  Yes Daniel J Angiulli, PA-C  metoprolol tartrate (LOPRESSOR) 12.5 mg TABS tablet 1 tab twice daily Sunday Tuesdays Thursdays Saturdays Patient taking differently: 12.5 mg taking twice daily on non-dialysis days (Sunday Tuesdays Thursdays Saturdays) 08/19/14  Yes Daniel J Angiulli, PA-C  midodrine (PROAMATINE) 5 MG tablet Take 1 tablet (5 mg total) by mouth 2 (two) times daily with a meal. 08/19/14  Yes Daniel J Angiulli, PA-C  multivitamin (RENA-VIT) TABS tablet Take 1 tablet by mouth at bedtime. 08/19/14  Yes Daniel J Angiulli, PA-C  Nutritional Supplements (FEEDING SUPPLEMENT, NEPRO CARB STEADY,) LIQD Take 237 mLs by mouth daily at 12 noon.   Yes Historical Provider, MD  OVER THE COUNTER  MEDICATION Apply 1 application topically every other day. Silver Alginate cream with dressing change applied to left leg   Yes Historical Provider, MD  sevelamer carbonate (RENVELA) 800 MG tablet Take 1 tablet (800 mg total) by mouth 3 (three) times daily with meals. 08/19/14  Yes Daniel J Angiulli, PA-C  acetaminophen (TYLENOL) 325 MG tablet Take 2 tablets (650 mg total) by mouth every 6 (six) hours as needed for moderate pain. Patient not taking: Reported on 12/07/2014 07/07/14   Ripudeep K Rai, MD  Amino Acids-Protein Hydrolys (FEEDING SUPPLEMENT, PRO-STAT SUGAR FREE 64,) LIQD Take 30 mLs by mouth 2 (two) times daily at 10 AM and 5 PM. Patient not taking: Reported on 12/07/2014 07/07/14   Ripudeep K Rai, MD  darbepoetin (ARANESP) 60 MCG/0.3ML SOLN injection Inject 60 mcg into the skin every   7 (seven) days. Wednesdays with dialysis    Historical Provider, MD  docusate sodium 100 MG CAPS Take 100 mg by mouth 2 (two) times daily. Patient not taking: Reported on 12/07/2014 08/19/14   Daniel J Angiulli, PA-C  famotidine (PEPCID) 20 MG tablet Take 1 tablet (20 mg total) by mouth at bedtime. Patient not taking: Reported on 12/07/2014 08/19/14   Daniel J Angiulli, PA-C  guaiFENesin (MUCINEX) 600 MG 12 hr tablet Take 1 tablet (600 mg total) by mouth 2 (two) times daily. Patient not taking: Reported on 12/07/2014 12/17/13   Saima Rizwan, MD  oxyCODONE (OXY IR/ROXICODONE) 5 MG immediate release tablet Take 1-2 tablets (5-10 mg total) by mouth every 4 (four) hours as needed for severe pain. Patient not taking: Reported on 12/07/2014 08/19/14   Daniel J Angiulli, PA-C  polyethylene glycol (MIRALAX / GLYCOLAX) packet Take 17 g by mouth daily. Patient not taking: Reported on 12/07/2014 08/19/14   Daniel J Angiulli, PA-C  prednisoLONE acetate (PRED FORTE) 1 % ophthalmic suspension Place 1 drop into both eyes 3 (three) times daily. Patient not taking: Reported on 12/07/2014 12/17/13   Saima Rizwan, MD    History    Social History  . Marital Status: Married    Spouse Name: N/A  . Number of Children: N/A  . Years of Education: N/A   Occupational History  . Not on file.   Social History Main Topics  . Smoking status: Never Smoker   . Smokeless tobacco: Not on file  . Alcohol Use: No  . Drug Use: No  . Sexual Activity: Not Currently   Other Topics Concern  . Not on file   Social History Narrative   ** Merged History Encounter **       Nonsmoker, no alcohol. Supportive family.    Family History  Problem Relation Age of Onset  . Hypertension Mother   . Hypertension Father   . Heart attack Brother   . Stroke Mother   . Pulmonary embolism Father     ROS: [x] Positive   [ ] Negative   [ ] All sytems reviewed and are negative  Cardiovascular: [] chest pain/pressure [] palpitations [] SOB lying flat [] DOE [] pain in legs while walking [] pain in legs at rest [] pain in legs at night [x] non-healing ulcers [] hx of DVT [x] swelling in legs  Pulmonary: [] productive cough [] asthma/wheezing [] home O2  Neurologic: [] weakness in [] arms [] legs [] numbness in [] arms [] legs [] hx of CVA [] mini stroke []difficulty speaking or slurred speech [] temporary loss of vision in one eye [] dizziness  Hematologic: [] hx of cancer [] bleeding problems [] problems with blood clotting easily  Endocrine:   [x] diabetes [] thyroid disease  GI [] vomiting blood [] blood in stool  GU: [x] CKD/renal failure [x] HD--[x] M/W/F or [] T/T/S [] burning with urination [] blood in urine  Psychiatric: [] anxiety [] depression  Musculoskeletal: [] arthritis [] joint pain  Integumentary: [] rashes [] ulcers  Constitutional: [] fever [] chills   Physical Examination  Filed Vitals:   12/07/14 1537  BP: 105/43  Pulse: 84  Temp: 101.2 F (38.4 C)  Resp: 18   There is no weight on file to calculate BMI.  General:  WD tired appearing female in NAD HENT: WNL,  normocephalic Pulmonary: normal non-labored breathing, without rales, rhonchi,  wheezing Cardiac: regular, without  Murmurs, rubs or gallops;   without carotid bruits Abdomen: soft, NT/ND, no masses Vascular Exam/Pulses:  Right Left  Radial 2+ (normal) 2+ (normal)  Ulnar Not palpable Not palpable  Femoral 2+ (normal) 2+ (normal)  Popliteal Not palpable Not palpable  DP Not palpable BKA  PT Not palpable BKA   Extremities: cellulitis of right lower extremity from below knee down to foot with blisters on upper thigh. Necrotic right heel wound. Full thickness wound with exposed bone to right lateral foot. Left below knee stump with ulceration at distal stump. Musculoskeletal: no muscle wasting or atrophy  Neurologic: A&O X 3; Appropriate Affect Speech is fluent/normal  CBC    Component Value Date/Time   WBC 16.6* 12/07/2014 1143   RBC 3.55* 12/07/2014 1143   RBC 3.30* 09/02/2010 0650   HGB 11.3* 12/07/2014 1143   HCT 34.1* 12/07/2014 1143   PLT 225 12/07/2014 1143   MCV 96.1 12/07/2014 1143   MCH 31.8 12/07/2014 1143   MCHC 33.1 12/07/2014 1143   RDW 17.7* 12/07/2014 1143   LYMPHSABS 0.7 12/07/2014 1143   MONOABS 0.8 12/07/2014 1143   EOSABS 0.0 12/07/2014 1143   BASOSABS 0.0 12/07/2014 1143    BMET    Component Value Date/Time   NA 134* 12/07/2014 1143   K 4.2 12/07/2014 1143   CL 94* 12/07/2014 1143   CO2 23 12/07/2014 1143   GLUCOSE 217* 12/07/2014 1143   BUN 50* 12/07/2014 1143   CREATININE 6.69* 12/07/2014 1143   CALCIUM 10.1 12/07/2014 1143   GFRNONAA 6* 12/07/2014 1143   GFRAA 7* 12/07/2014 1143    COAGS: Lab Results  Component Value Date   INR 1.18 06/27/2014   INR 1.31 06/27/2014   INR 1.06 11/22/2013     ASSESSMENT/PLAN: This is a 69 y.o. female with bilateral lower extremity critical limb ischemia manifested by right necrotic heel wound and right lateral full thickness foot wound with exposed bone, cellulitis of right lower leg and ulceration of her  left below knee stump. She was last seen by Dr. Chen last week on 12/01/14 who recommended arteriogram with bilateral runoff and intervention. Her ABIs at that time revealed non-compressible vessels and monophasic waveforms. She has a history of three vessel CAD and aortic stenosis. She reportedly has had three cardiac arrests during previous hospital admissions.  Given her significant co-morbidities, she is not considered a surgical candidate for any bypass procedures. The patient and daughter are aware that she may not benefit from endovascular intervention and require amputation and revision of her left below knee amputation. The daughter is concerned about anesthesia and cardiac arrest and are against amputation. Even with light sedation, she has coded. Tentatively plan for arteriogram sometime in the future.  Admitted by internal medicine and started on IV antibiotics. Dr. Mckynlie Vanderslice to evaluate patient.     Kimberly Trinh, PA-C [facility] Office: [phone] Pager: [phone]  Agree with above assessment Patient has multiple comorbidities and is not a candidate for open surgical bypass She has extensive ulcer right heel measuring about 2 cm in diameter and necrotic ulcer lateral right foot over fifth metatarsal bone. She has very dark chronically ischemic scan lower third of leg with cellulitis up to just below knee. There are some pustules in the skin below the knee. Blistered area and right thigh is where patient spilled hot coffee on her thigh last week. She has ABI of 0.2 with multilevel occlusive disease. Doubt very seriously that she will heal these ulcers and resolve this infection even with attempt   at revascularization percutaneously I discussed this with patient and her daughter. I think patient may be best treated with right above-knee amputation but daughter says patient is not ready to accept this  Would recommend treating her aggressively with IV antibiotics over  the next 3-5 days to see if any improvement. If she has progression of cellulitis proximally she will need proximal leg amputation. If cellulitis resolves Dr. Chen will make decision regarding whether he would like to proceed with angiography early next week. Discussed this plan with renal and they are in agreement. 

## 2014-12-15 NOTE — Progress Notes (Signed)
Notified by unit secretary patient will be transferring to different floor directly from current procedure, awaiting order to see which bed in order to call report to new patient unit.

## 2014-12-15 NOTE — Progress Notes (Signed)
PROGRESS NOTE  Briana Gould NFA:[id number] DOB: October 28, 1945 DOA: 12/07/2014 PCP: [name], MD  HPI/Recap of past 74 hours: 69 year old female with past medical history of severe peripheral vascular disease, end-stage renal disease on hemodialysis, aortic stenosis and diabetes mellitus who has previous history of bilateral critical limb ischemia and status post left BKA and this the past felt to be a non-operative candidate due to high risk. Patient brought in for nausea and vomiting and found to have severe cellulitis.  Started on broad-spectrum antibiotics. Seen by nephrology who continue to manage dialysis. Seen by vascular surgery. MRI ruled out osteomyelitis.    Patient underwent angiogram by vascular surgery and after review of flow, recommendation is for bilateral AKA. Throughout hospitalization, Patient has been having more issues with some delirium and somnolence. This is felt to be more likely related to underlying infection plus likely underlying vascular dementia. There is also some reports of questionable previous mental health issues  Assessment/Plan: Principal Problem:   Cellulitis secondary to severe peripheral vascular disease. Patient on continued broad-spectrum antibiotics and white count initially improved, but now worsening again. Plan will be for bilateral AKA  Acute encephalopathy: Somnolent, not felt to be medication related. More likely due to infection and possibly underlying vascular dementia Active Problems:   Diabetes mellitus with end stage renal disease: Overall CBG stable   ESRD (end stage renal disease) on dialysis: Continue management as per dialysis  Somnolence: Medications reviewed, it is felt that patient's delirium which may be from underlying infection. Despite broad-spectrum antibiotics, white blood cell count remains high and best treatment for underlying infection would be bilateral AKA   Code Status: Full code  Family Communication: Discussed  case with patient's sister yesterday  Disposition Plan: Patient will need bilateral AKA   Consultants:  Nephrology  Vascular surgery  Cardiology  Procedures:  Status post arteriogram done 4/7  Hemodialysis as per nephrology  Antibiotics:  IV vancomycin 4/1-present  IV Zosyn 4/1-present   Objective: BP 102/50 mmHg  Pulse 78  Temp(Src) 99 F (37.2 C) (Oral)  Resp 17  Ht  (1.651 m)  Wt 67.767 kg (149 lb 6.4 oz)  BMI 24.86 kg/m2  SpO2 100%  Intake/Output Summary (Last 24 hours) at 12/15/14 1806 Last data filed at 12/15/14 0900  Gross per 24 hour  Intake      0 ml  Output   2000 ml  Net  -2000 ml   Filed Weights   12/14/14 1547 12/14/14 1947 12/15/14 0509  Weight: 75.2 kg (165 lb 12.6 oz) 73.2 kg (161 lb 6 oz) 67.767 kg (149 lb 6.4 oz)    Exam: Seen prior to arteriogram  General:  Somnolent  Cardiovascular: 3/6 holosystolic murmur  Respiratory: Clear to auscultation bilaterally  Abdomen: Soft, nontender, nondistended, positive bowel sounds  Musculoskeletal: Right lower extremity with secondary blistering and cellulitis and heel wound stage IV. Patient noted to have ulceration of left below-the-knee stump   Data Reviewed: Basic Metabolic Panel:  Recent Labs Lab 12/09/14 0520 12/11/14 0727 12/13/14 0655 12/14/14 1559 12/15/14 0600 12/15/14 1528  NA 135 133* 138 136 135  --   K 4.3 4.0 4.1 4.6 4.1  --   CL 96 96 98 96 97  --   CO2 --   GLUCOSE 144* 139* 161* 181* 136*  --   BUN 45* 51* 51* 89* 43*  --   CREATININE 5.46* 5.21* 4.50* 6.16* 4.05* 4.82*  CALCIUM 9.5 9.3 9.2  9.6 9.3  --   PHOS  --   --   --  5.3*  --   --    Liver Function Tests:  Recent Labs Lab 12/09/14 0520 12/11/14 0727 12/13/14 0655 12/14/14 1559  AST 46* 35 25  --   ALT --   ALKPHOS 135* 111 115  --   BILITOT 1.5* 1.2 1.4*  --   PROT 6.9 6.7 7.0  --   ALBUMIN 2.8* 2.6* 2.5* 2.4*   No results for input(s): LIPASE, AMYLASE in the  last 168 hours.  Recent Labs Lab 12/15/14 1524  AMMONIA 36*   CBC:  Recent Labs Lab 12/11/14 0727 12/13/14 0655 12/14/14 1559 12/15/14 0600 12/15/14 1528  WBC 12.7* 12.9* 15.4* 16.1* 17.4*  HGB 10.9* 10.4* 9.2* 8.6* 8.8*  HCT 33.2* 31.9* 27.3* 26.3* 26.6*  MCV 96.2 97.3 95.1 97.0 96.4  PLT 202 293 339 297 336   Cardiac Enzymes:   No results for input(s): CKTOTAL, CKMB, CKMBINDEX, TROPONINI in the last 168 hours. BNP (last 3 results) No results for input(s): BNP in the last 8760 hours.  ProBNP (last 3 results) No results for input(s): PROBNP in the last 8760 hours.  CBG:  Recent Labs Lab 12/14/14 1240 12/14/14 2156 12/15/14 0759 12/15/14 1254 12/15/14 1657  GLUCAP 166* 113* 135* 134* 136*    Recent Results (from the past 240 hour(s))  Culture, blood (routine x 2)     Status: None   Collection Time: 12/07/14 11:31 AM  Result Value Ref Range Status   Specimen Description BLOOD HAND RIGHT  Final   Special Requests BOTTLES DRAWN AEROBIC AND ANAEROBIC 5CC  Final   Culture   Final    NO GROWTH 5 DAYS Performed at [facility]    Report Status 12/13/2014 FINAL  Final  Culture, blood (routine x 2)     Status: None   Collection Time: 12/07/14 11:43 AM  Result Value Ref Range Status   Specimen Description BLOOD RIGHT FOREARM  Final   Special Requests BOTTLES DRAWN AEROBIC AND ANAEROBIC 5CC  Final   Culture   Final    NO GROWTH 5 DAYS Performed at [facility]    Report Status 12/13/2014 FINAL  Final     Studies: No results found.  Scheduled Meds: . aspirin  81 mg Oral Daily  . atropine  1 application Left Eye Q1200  . calcitRIOL  1.25 mcg Oral Q M,W,F-HD  . ceFEPime (MAXIPIME) IV  2 g Intravenous Q M,W,F-HD  . collagenase  1 application Topical Once per day on Sun Tue Thu Sat  . docusate sodium  100 mg Oral BID  . famotidine  20 mg Oral QHS  . feeding supplement (NEPRO CARB STEADY)  237 mL Oral Q24H  . feeding supplement (PRO-STAT  SUGAR FREE 64)  30 mL Oral TID  . heparin  5,000 Units Subcutaneous 3 times per day  . insulin aspart  0-5 Units Subcutaneous QHS  . insulin aspart  0-9 Units Subcutaneous TID WC  . metoprolol tartrate  12.5 mg Oral Q M,W,F-HD  . midodrine  10 mg Oral Q M,W,F-HD  . midodrine  5 mg Oral BID WC  . multivitamin  1 tablet Oral QHS  . neomycin-bacitracin-polymyxin  1 application Topical Daily  . polyethylene glycol  17 g Oral Daily  . sevelamer carbonate  800 mg Oral TID WC  . sodium chloride  3 mL Intravenous Q12H  . sodium chloride  3 mL Intravenous Q12H  . vancomycin  750 mg Intravenous Q M,W,F-HD    Continuous Infusions:    Time spent: 15 minutes  Hollice EspyKRISHNAN,Anouk Critzer K  [facility] Pager [phone]. If 7PM-7AM, please contact night-coverage at [email or web address], password [email or web address] 12/15/2014, 6:06 PM  LOS: 8 days

## 2014-12-15 NOTE — Clinical Social Work Note (Signed)
CSW rec'd referral for ?SNF. Patient was discussed in unit rounds as well- patient off unit at this time. FL2 has been updated and is prepared for SNF search- full assessment to follow-  Evin Chirco, MSW, TheresReece Levyia MajorsLCSWA [phone]

## 2014-12-15 NOTE — Progress Notes (Signed)
Medicare Important Message given?  YES (If response is "NO", the following Medicare IM given date fields will be blank) Date Medicare IM given:  12/15/14 Medicare IM given by:  Irlene Crudup 

## 2014-12-15 NOTE — Care Management Note (Signed)
    Page 1 of 1   12/21/2014     2:13:56 PM CARE MANAGEMENT NOTE 12/21/2014  Patient:  Briana Gould,Briana Gould   Account Number:  [id number]  Date Initiated:  12/08/2014  Documentation initiated by:  [facility]  Subjective/Objective Assessment:   right leg cellulitis from heel ulcer, HX ESRD on hemodialysis, DM, PAD, left BKA     Action/Plan:   Anticipated DC Date:  12/20/2014   [facility]  In-house referral  Clinical Social Worker         Choice offered to / List presented to:             Status of service:  Completed, signed off Medicare Important Message given?  YES (If response is "NO", the following Medicare IM given date fields will be blank) Date Medicare IM given:  12/15/2014 Medicare IM given by:  Lawerance SabalSWIST,DEBBIE Date Additional Medicare IM given:  12/19/2014 Additional Medicare IM given by:  Donn PieriniKRISTI Edwardo Wojnarowski  [facility]  Per UR Regulation:  Reviewed for med. necessity/level of care/duration of stay  If discussed at Long Length of Stay Meetings, dates discussed:   12/20/2014    Comments:  12/21/14- 1000- Donn PieriniKristi Joeseph Verville RN, BSN [phone] [facility]

## 2014-12-15 NOTE — Progress Notes (Signed)
OT Cancellation Note  Patient Details Name: Zamya Barbaraann BoysS Dohrman MRN: [id number] DOB: 06-Jan-1946   Cancelled Treatment:    Reason Eval/Treat Not Completed: Patient at procedure or test/ unavailable. Pt off floor at cath lab. Acute OT to follow up with pt as available to complete evaluation.   Nena JordanMiller, Dartagnan Beavers M 12/15/2014, 12:59 PM

## 2014-12-15 NOTE — Progress Notes (Addendum)
[facility] Progress Note   Subjective: remains lethargic and somewhat confused, no specific complaints  Filed Vitals:   12/14/14 1947 12/14/14 2203 12/15/14 0509 12/15/14 0512  BP: 143/68 114/47  106/52  Pulse: 85   81  Temp: 97.5 F (36.4 C) 99.4 F (37.4 C)  99.2 F (37.3 C)  TempSrc: Oral Oral  Oral  Resp: 20 16  28   Height:      Weight: 73.2 kg (161 lb 6 oz)  67.767 kg (149 lb 6.4 oz)   SpO2: 100% 100%  100%   Exam: Somnolent, slurred speech, no distress No jvd Chest mostly clear occ rales at bases RRR 2/6 murmur Abd soft, NTND, no mass or ascites R foot wrapped, R leg +edema , new papular/crusted lesions along R lat lower leg L BKA wrapped LUA AVF patent Neuro nonfocal, up in chair, somnolent, no myoclonic jerking  HD: [name] MWF 4h 72.5kg 2/2 Bath LUA AVF Heparin 5000 Mircera 75 q 4 wks last 3/23, Venofer 100 / HD, Calcitriol 1.25 ug TIW Pth 658 declining        Assessment: 1. Cellulitis RLE / R foot ulcers - on vanc/cefipime, MRI neg for osteo.  Was getting better now worsening again. For angiogram per VVS today.   2. AMS - lethargic, all sedating meds stopped yesterday.  3. ESRD HD MWF 4. Chronic hypotension - on midodrine 5. Volume is below dry wt, no vol excess 6. Anemia cont ESA 7. MBD cont meds 8. 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 DM 2  Plan - HD tomorrow, keep even.  Check NH3.  Prognosis guarded.     Briana Moselleob Zoeie Ritter MD  pager [phone]    cell [phone]  12/15/2014, 11:43 AM     Recent Labs Lab 12/13/14 0655 12/14/14 1559 12/15/14 0600  NA 138 136 135  K 4.1 4.6 4.1  CL 98 96 97  CO2 28 26 21   GLUCOSE 161* 181* 136*  BUN 51* 89* 43*  CREATININE 4.50* 6.16* 4.05*  CALCIUM 9.2 9.6 9.3  PHOS  --  5.3*  --     Recent Labs Lab 12/09/14 0520 12/11/14 0727 12/13/14 0655 12/14/14 1559  AST 46* 35 25  --   ALT 25 20 16   --   ALKPHOS 135* 111 115  --   BILITOT 1.5* 1.2 1.4*  --   PROT 6.9 6.7  7.0  --   ALBUMIN 2.8* 2.6* 2.5* 2.4*    Recent Labs Lab 12/13/14 0655 12/14/14 1559 12/15/14 0600  WBC 12.9* 15.4* 16.1*  HGB 10.4* 9.2* 8.6*  HCT 31.9* 27.3* 26.3*  MCV 97.3 95.1 97.0  PLT 293 339 297   . aspirin  81 mg Oral Daily  . atropine  1 application Left Eye Q1200  . calcitRIOL  1.25 mcg Oral Q M,W,F-HD  . ceFEPime (MAXIPIME) IV  2 g Intravenous Q M,W,F-HD  . collagenase  1 application Topical Once per day on Sun Tue Thu Sat  . docusate sodium  100 mg Oral BID  . famotidine  20 mg Oral QHS  . feeding supplement (NEPRO CARB STEADY)  237 mL Oral Q24H  . feeding supplement (PRO-STAT SUGAR FREE 64)  30 mL Oral TID  . heparin  5,000 Units Subcutaneous 3 times per day  . insulin aspart  0-5 Units Subcutaneous QHS  . insulin aspart  0-9 Units Subcutaneous TID WC  . metoprolol tartrate  12.5 mg Oral Q M,W,F-HD  .  midodrine  10 mg Oral Q M,W,F-HD  . midodrine  5 mg Oral BID WC  . multivitamin  1 tablet Oral QHS  . neomycin-bacitracin-polymyxin  1 application Topical Daily  . polyethylene glycol  17 g Oral Daily  . sevelamer carbonate  800 mg Oral TID WC  . sodium chloride  3 mL Intravenous Q12H  . vancomycin  750 mg Intravenous Q M,W,F-HD     sodium chloride, acetaminophen **OR** acetaminophen, morphine injection, ondansetron **OR** ondansetron (ZOFRAN) IV, sodium chloride

## 2014-12-15 NOTE — Op Note (Signed)
OPERATIVE NOTE   PROCEDURE: 1.  Left common femoral artery cannulation under ultrasound guidance 2.  Aortogram 3.  Second order arterial selection 4.  Right leg runoff via catheter 5.  Left leg runoff  PRE-OPERATIVE DIAGNOSIS: right heel ulcer, left below-knee amputation stump ischemia  POST-OPERATIVE DIAGNOSIS: same as above   SURGEON: Briana Sake, MD  ANESTHESIA: conscious sedation  ESTIMATED BLOOD LOSS: 50 cc  CONTRAST: 95 cc  FINDING(S):  Aorta:  Patent but calcified  Superior mesenteric artery: patent  Celiac artery: possible proximal stenosis, incompletely imaged   Right Left  RA Patent with 75-90% stenosis Patent with >90% stenosis  CIA Patent Patent with 50% stenosis  EIA Patent Patent  IIA Patent Patent with orifical stenosis >90%  CFA Patent Patent  SFA Patent but diffusely diseased with distal serial stenoses >75%, >90% Occluded  PFA Patent, one branch occludes distally Patent and hypertrophied anterior branch, posterior branch occluded  Pop Patent but >90% stenosis in above-the-knee segment Occluded  Trif Essentially occluded with collaterals present BKA  AT Occluded BKA  Pero Small bridges perfused by collaterals, severely diseased vessel extends distally BKA  PT Occluded BKA   SPECIMEN(S):  none  INDICATIONS:   Briana Gould is a 69 y.o. female who presents with right heel ulcer and lower leg cellulitis.  The patient presents for: Aortogram, bilateral leg runoff, and possible right leg intervention.  I discussed with the patient the nature of angiographic procedures, especially the limited patencies of any endovascular intervention.  The patient is aware of that the risks of an angiographic procedure include but are not limited to: bleeding, infection, access site complications, renal failure, embolization, rupture of vessel, dissection, possible need for emergent surgical intervention, possible need for surgical procedures to treat the patient's  pathology, and stroke and death.  The patient is aware of the risks and agrees to proceed.  DESCRIPTION: After full informed consent was obtained from the patient, the patient was brought back to the angiography suite.  The patient was placed supine upon the angiography table and connected to monitoring equipment.  The patient was then given conscious sedation, the amounts of which are documented in the patient's chart.  The patient was prepped and drape in the standard fashion for an angiographic procedure.  At this point, attention was turned to the left groin.  Under ultrasound guidance, the subcutaneous tissue surrounding the left common femoral artery was anesthesized with 1% lidocaine with epinephrine.  The artery was then cannulated with a micropuncture needle.  The microwire was advanced into the iliac arterial system.  The needle was exchanged for a microsheath, which was loaded into the common femoral artery over the wire.  The microwire was exchanged for a [facility] wire which was advanced into the aorta.  The microsheath was then exchanged for a 5-Fr sheath which was loaded into the common femoral artery.  The Omniflush catheter was then loaded over the wire up to the level of L1.  The catheter was connected to the power injector circuit.  After de-airring and de-clotting the circuit, a power injector aortogram was completed.  The findings are as listed above.  The [facility] wire was replaced in the catheter, and using the [place] and Omniflush catheter, the right common iliac artery was selected.  The wire was advanced into the external iliac artery but the catheter would not cross the narrow calcified bifurcation.  I exchanged the catheter for an endhole catheter.  This was advanced into the right  external iliac artery.  An automated right leg runoff was completed after de-airring and de-clotting the circuit.  The findings are as listed above.  The [facility] wire was replaced in the catheter to straighten the  crook of the catheter.  Both were removed together from the sheath.  The left sheath was aspirated and no clot was present.  The sheath was connected to the power injector circuit.  An left leg runoff was completed in stations after de-airring and de-clotting the circuit.  The findings are as listed above.  The sheath was aspirated.  No clots were present and the sheath was reloaded with heparinized saline.  Based on the images, this patient needs: Bilateral above-knee amputations.  COMPLICATIONS: none  CONDITION: stable   Briana SakeBrian Olivette Beckmann, MD [facility] [place] Office: [phone] Pager: [phone]  12/15/2014, 12:39 PM

## 2014-12-16 DIAGNOSIS — L0390 Cellulitis, unspecified: Secondary | ICD-10-CM

## 2014-12-16 LAB — GLUCOSE, CAPILLARY
GLUCOSE-CAPILLARY: 166 mg/dL — AB (ref 70–99)
GLUCOSE-CAPILLARY: 216 mg/dL — AB (ref 70–99)
Glucose-Capillary: 179 mg/dL — ABNORMAL HIGH (ref 70–99)
Glucose-Capillary: 109 mg/dL — ABNORMAL HIGH (ref 70–99)

## 2014-12-16 LAB — BASIC METABOLIC PANEL
Anion gap: 12 (ref 5–15)
BUN: 57 mg/dL — AB (ref 6–23)
CHLORIDE: 98 mmol/L (ref 96–112)
CO2: 26 mmol/L (ref 19–32)
CREATININE: 5.41 mg/dL — AB (ref 0.50–1.10)
Calcium: 9.4 mg/dL (ref 8.4–10.5)
GFR calc Af Amer: 8 mL/min — ABNORMAL LOW (ref >90)
GFR calc non Af Amer: 7 mL/min — ABNORMAL LOW (ref >90)
GLUCOSE: 202 mg/dL — AB (ref 70–99)
Potassium: 4.5 mmol/L (ref 3.5–5.1)
Sodium: 136 mmol/L (ref 135–145)

## 2014-12-16 LAB — CBC
HCT: 27.4 % — ABNORMAL LOW (ref 36.0–46.0)
HEMOGLOBIN: 9 g/dL — AB (ref 12.0–15.0)
MCH: 31.9 pg (ref 26.0–34.0)
MCHC: 32.8 g/dL (ref 30.0–36.0)
MCV: 97.2 fL (ref 78.0–100.0)
Platelets: 360 10*3/uL (ref 150–400)
RBC: 2.82 MIL/uL — AB (ref 3.87–5.11)
RDW: 18.1 % — ABNORMAL HIGH (ref 11.5–15.5)
WBC: 16.8 10*3/uL — ABNORMAL HIGH (ref 4.0–10.5)

## 2014-12-16 MED ORDER — ALTEPLASE 2 MG IJ SOLR
2.0000 mg | Freq: Once | INTRAMUSCULAR | Status: DC | PRN
  Filled 2014-12-16: qty 2

## 2014-12-16 MED ORDER — LIDOCAINE-PRILOCAINE 2.5-2.5 % EX CREA: 1.0000 "application " | TOPICAL_CREAM | CUTANEOUS | Status: DC | PRN

## 2014-12-16 MED ORDER — NEPRO/CARBSTEADY PO LIQD
237.0000 mL | ORAL | Status: DC | PRN
  Filled 2014-12-16: qty 237

## 2014-12-16 MED ORDER — HEPARIN SODIUM (PORCINE) 1000 UNIT/ML DIALYSIS
1000.0000 [IU] | INTRAMUSCULAR | Status: DC | PRN
  Filled 2014-12-16: qty 1

## 2014-12-16 MED ORDER — SODIUM CHLORIDE 0.9 % IV SOLN: 100.0000 mL | INTRAVENOUS | Status: DC | PRN

## 2014-12-16 MED ORDER — HEPARIN SODIUM (PORCINE) 1000 UNIT/ML DIALYSIS
2000.0000 [IU] | INTRAMUSCULAR | Status: DC | PRN
  Filled 2014-12-16: qty 2

## 2014-12-16 MED ORDER — LIDOCAINE HCL (PF) 1 % IJ SOLN: 5.0000 mL | INTRAMUSCULAR | Status: DC | PRN

## 2014-12-16 MED ORDER — PENTAFLUOROPROP-TETRAFLUOROETH EX AERO: 1.0000 "application " | INHALATION_SPRAY | CUTANEOUS | Status: DC | PRN

## 2014-12-16 MED ORDER — TRAMADOL HCL 50 MG PO TABS
50.0000 mg | ORAL_TABLET | Freq: Four times a day (QID) | ORAL | Status: DC | PRN
  Administered 2014-12-18 – 2014-12-19 (×3): 100 mg via ORAL
  Filled 2014-12-16 (×3): qty 2

## 2014-12-16 NOTE — Clinical Social Work Placement (Signed)
Clinical Social Work Department CLINICAL SOCIAL WORK PLACEMENT NOTE 12/16/2014  Patient:  Montel CulverVALDES,Christianne S  Account Number:  [id number] Admit date:  12/07/2014  Clinical Social Worker:  Merlyn LotJENNA HOLOMAN, CLINICAL SOCIAL WORKER  Date/time:  12/16/2014 01:29 PM  Clinical Social Work is seeking post-discharge placement for this patient at the following level of care:   SKILLED NURSING   (*CSW will update this form in Epic as items are completed)   12/16/2014  Patient/family provided with [facility] of Clinical Social Work's list of facilities offering this level of care within the geographic area requested by the patient (or if unable, by the patient's family).  12/16/2014  Patient/family informed of their freedom to choose among providers that offer the needed level of care, that participate in Medicare, Medicaid or managed care program needed by the patient, have an available bed and are willing to accept the patient.  12/16/2014  Patient/family informed of [facility]' ownership interest in [facility], as well as of the fact that they are under no obligation to receive care at this facility.  PASARR submitted to EDS on 12/16/2014 PASARR number received on 12/16/2014  FL2 transmitted to all facilities in geographic area requested by pt/family on  12/16/2014 FL2 transmitted to all facilities within larger geographic area on   Patient informed that his/her managed care company has contracts with or will negotiate with  certain facilities, including the following:     Patient/family informed of bed offers received:   Patient chooses bed at  Physician recommends and patient chooses bed at    Patient to be transferred to  on   Patient to be transferred to facility by  Patient and family notified of transfer on  Name of family member notified:    The following physician request were entered in Epic:   Additional Comments: Merlyn LotJenna Holoman, [facility] Clinical Social  Worker [phone]

## 2014-12-16 NOTE — Progress Notes (Signed)
   Daily Progress Note  Assessment/Planning: POD #1 s/p Ao, BRo   Palliative care as discussed  Available as needed  Subjective  - 1 Day Post-Op  Confused  Objective Filed Vitals:   12/16/14 1030 12/16/14 1100 12/16/14 1115 12/16/14 1500  BP: 130/63 106/60 111/48 94/32  Pulse: 78 78 75 63  Temp:   98.2 F (36.8 C) 98.2 F (36.8 C)  TempSrc:   Oral Oral  Resp:   18 14  Height:      Weight:   164 lb 3.9 oz (74.5 kg)   SpO2:   100% 100%    Intake/Output Summary (Last 24 hours) at 12/16/14 1949 Last data filed at 12/16/14 1700  Gross per 24 hour  Intake    120 ml  Output      0 ml  Net    120 ml   VASC  L groin without hematoma or PSA  Laboratory CBC    Component Value Date/Time   WBC 16.8* 12/16/2014 0356   HGB 9.0* 12/16/2014 0356   HCT 27.4* 12/16/2014 0356   PLT 360 12/16/2014 0356    BMET    Component Value Date/Time   NA 136 12/16/2014 0356   K 4.5 12/16/2014 0356   CL 98 12/16/2014 0356   CO2 26 12/16/2014 0356   GLUCOSE 202* 12/16/2014 0356   BUN 57* 12/16/2014 0356   CREATININE 5.41* 12/16/2014 0356   CALCIUM 9.4 12/16/2014 0356   GFRNONAA 7* 12/16/2014 0356   GFRAA 8* 12/16/2014 0356    Leonides SakeBrian Chen, MD [facility] [place] Office: [phone] Pager: [phone]  12/16/2014, 7:49 PM

## 2014-12-16 NOTE — Progress Notes (Signed)
[facility] Progress Note   Subjective: remains lethargic, off all sedating meds  Filed Vitals:   12/16/14 0930 12/16/14 1000 12/16/14 1030 12/16/14 1100  BP: 117/58 105/59 130/63 106/60  Pulse: 79 48 78 78  Temp:      TempSrc:      Resp:      Height:      Weight:      SpO2:       Exam: Somnolent, slurred speech, no distress No jvd Chest mostly clear occ rales at bases RRR 2/6 murmur Abd soft, NTND, no mass or ascites R foot wrapped, R leg +edema , new papular/crusted lesions along R lat lower leg L BKA wrapped LUA AVF patent Neuro nonfocal, up in chair, somnolent, no myoclonic jerking  HD: [name] MWF 4h 72.5kg 2/2 Bath LUA AVF Heparin 5000 Mircera 75 q 4 wks last 3/23, Venofer 100 / HD, Calcitriol 1.25 ug TIW Pth 658 declining        Assessment: 1. Cellulitis RLE / R foot ulcers - on vanc/cefipime. Angiogram showed severe disease, VVS recommending bilat AKA   2. AMS - lethargic, all sedating meds have been stopped  3. ESRD HD MWF 4. Chronic hypotension - on midodrine 5. Volume is stable 6. Anemia cont ESA 7. MBD cont meds 8. 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 DM 2  Plan - HD today. Amputation per surgery.     Vinson Moselle MD  pager [phone]    cell [phone]  12/16/2014, 11:11 AM     Recent Labs Lab 12/14/14 1559 12/15/14 0600 12/15/14 1528 12/16/14 0356  NA 136 135  --  136  K 4.6 4.1  --  4.5  CL 96 97  --  98  CO2 26 21  --  26  GLUCOSE 181* 136*  --  202*  BUN 89* 43*  --  57*  CREATININE 6.16* 4.05* 4.82* 5.41*  CALCIUM 9.6 9.3  --  9.4  PHOS 5.3*  --   --   --     Recent Labs Lab 12/11/14 0727 12/13/14 0655 12/14/14 1559  AST 35 25  --   ALT 20 16  --   ALKPHOS 111 115  --   BILITOT 1.2 1.4*  --   PROT 6.7 7.0  --   ALBUMIN 2.6* 2.5* 2.4*    Recent Labs Lab 12/15/14 0600 12/15/14 1528 12/16/14 0356  WBC 16.1* 17.4* 16.8*  HGB 8.6* 8.8* 9.0*  HCT 26.3* 26.6* 27.4*  MCV 97.0 96.4  97.2  PLT 297 336 360   . aspirin  81 mg Oral Daily  . atropine  1 application Left Eye Q1200  . calcitRIOL  1.25 mcg Oral Q M,W,F-HD  . ceFEPime (MAXIPIME) IV  2 g Intravenous Q M,W,F-HD  . collagenase  1 application Topical Once per day on Sun Tue Thu Sat  . docusate sodium  100 mg Oral BID  . famotidine  20 mg Oral QHS  . feeding supplement (NEPRO CARB STEADY)  237 mL Oral Q24H  . feeding supplement (PRO-STAT SUGAR FREE 64)  30 mL Oral TID  . heparin  5,000 Units Subcutaneous 3 times per day  . insulin aspart  0-5 Units Subcutaneous QHS  . insulin aspart  0-9 Units Subcutaneous TID WC  . metoprolol tartrate  12.5 mg Oral Q M,W,F-HD  . midodrine  10 mg Oral Q M,W,F-HD  . midodrine  5 mg Oral BID WC  . multivitamin  1 tablet Oral QHS  . neomycin-bacitracin-polymyxin  1 application Topical Daily  . polyethylene glycol  17 g Oral Daily  . sevelamer carbonate  800 mg Oral TID WC  . sodium chloride  3 mL Intravenous Q12H  . sodium chloride  3 mL Intravenous Q12H  . vancomycin  750 mg Intravenous Q M,W,F-HD     sodium chloride, sodium chloride, sodium chloride, sodium chloride, acetaminophen **OR** acetaminophen, alteplase, feeding supplement (NEPRO CARB STEADY), heparin, [START ON 12/17/2014] heparin, lidocaine (PF), lidocaine-prilocaine, morphine injection, morphine injection, ondansetron **OR** ondansetron (ZOFRAN) IV, oxyCODONE-acetaminophen, pentafluoroprop-tetrafluoroeth, sodium chloride, sodium chloride

## 2014-12-16 NOTE — Progress Notes (Signed)
OT Cancellation Note  Patient Details Name: Briana Gould MRN: [id number] DOB: 12/13/1945   Cancelled Treatment:    Reason Eval/Treat Not Completed: Patient at procedure or test/ unavailable (HD)Will attempt to see later today as schedule allows.  Evern BioMayberry, Cuyler Vandyken Lynn 12/16/2014, 8:27 AM

## 2014-12-16 NOTE — Clinical Social Work Psychosocial (Signed)
Clinical Social Work Department BRIEF PSYCHOSOCIAL ASSESSMENT 12/16/2014  Patient:  Briana Gould,Briana Gould     Account Number:  [id number]     Admit date:  12/07/2014  Clinical Social Worker:  Merlyn LotHOLOMAN,Shaylon Aden, CLINICAL SOCIAL WORKER  Date/Time:  12/16/2014 01:25 PM  Referred by:  Physician  Date Referred:  12/16/2014 Referred for  SNF Placement   Other Referral:   Interview type:  Patient Other interview type:    PSYCHOSOCIAL DATA Living Status:  FAMILY Admitted from facility:   Level of care:   Primary support name:  Briana Gould Primary support relationship to patient:  CHILD, ADULT Degree of support available:   Patient reports high level of support    CURRENT CONCERNS Current Concerns  Post-Acute Placement   Other Concerns:    SOCIAL WORK ASSESSMENT / PLAN CSW spoke with pt concerning PT recommendation for SNF.  Pt is agreeable to SNF placement and states that she has been to [name] in the past- pt is agreeable to returning to [name] but would like to see what all of her options are at this time.    Pt reports being in a great deal of pain during the interview and did not elaborate on her living situation except to say that she does not live alone.   Assessment/plan status:  Psychosocial Support/Ongoing Assessment of Needs Other assessment/ plan:   Fl2 upate   Information/referral to community resources:    PATIENT'Gould/FAMILY'Gould RESPONSE TO PLAN OF CARE: Patient is agreeable to SNF and understands she will need rehab before she is able to return home.       Merlyn LotJenna Holoman, LCSWA Clinical Social Worker [phone]

## 2014-12-16 NOTE — Plan of Care (Signed)
Problem: Discharge Progression Outcomes Goal: Pain controlled with appropriate interventions Outcome: Not Progressing Patient can not take percocet due to nausea and vomiting.  Tylenol is not strong enough to reduce her 9 out of 10 pain level and blood pressure is too low for morphine.  Patient is crying and disoriented due to severe pain level.

## 2014-12-16 NOTE — Evaluation (Signed)
Occupational Therapy Evaluation Patient Details Name: Briana Gould MRN: [id number] DOB: [birth year] Today's Date: 12/16/2014    History of Present Illness Pt admitted after she was too weak to go to dialysis.  In ER R LE showed wounds and cellulitic redness. PMH: L BKA with current wound, ESRD.    Clinical Impression   Pt significantly limited by uncontrolled pain.  RN notified, pt has been refusing pain management beyond Tylenol.  Pt demonstrates ability to perform eating, grooming and to assist with UB ADL.  She is dependent in LB ADL and requires +2 total assist for transfers.  Plan is for SNF upon d/c.  Will defer further OT to SNF.    Follow Up Recommendations  SNF;Supervision/Assistance - 24 hour    Equipment Recommendations       Recommendations for Other Services       Precautions / Restrictions Precautions Precautions: Fall Restrictions Weight Bearing Restrictions: No      Mobility Bed Mobility      General bed mobility comments: up in chair  Transfers Overall transfer level: Needs assistance Equipment used: 2 person hand held assist Transfers: Sit to/from Starwood HotelsStand;Squat Pivot Transfers Sit to Stand: Total assist;+2 physical assistance   Squat pivot transfers: Total assist;+2 physical assistance         Balance Overall balance assessment: Needs assistance Sitting-balance support: Single extremity supported;Bilateral upper extremity supported;Feet supported Sitting balance-Leahy Scale: Poor Sitting balance - Comments: Today tended to list posteriorly and stiffen up as if she felt she might slide off the bed..                                    ADL Overall ADL's : Needs assistance/impaired Eating/Feeding: Independent;Sitting   Grooming: Wash/dry hands;Wash/dry face;Set up;Sitting   Upper Body Bathing: Minimal assitance;Sitting   Lower Body Bathing: Total assistance   Upper Body Dressing : Set up;Sitting   Lower Body Dressing: Total  assistance               Functional mobility during ADLs: +2 for physical assistance;Total assistance       Vision     Perception     Praxis      Pertinent Vitals/Pain Pain Assessment: Faces Faces Pain Scale: Hurts worst Pain Location: R LE Pain Descriptors / Indicators: Aching;Crying Pain Intervention(s): Repositioned;Limited activity within patient's tolerance;Monitored during session;Patient requesting pain meds-RN notified     Hand Dominance Right   Extremity/Trunk Assessment Upper Extremity Assessment Upper Extremity Assessment: Generalized weakness;LUE deficits/detail LUE Deficits / Details: functional assist at baseline LUE Coordination: decreased fine motor;decreased gross motor   Lower Extremity Assessment Lower Extremity Assessment: Defer to PT evaluation   Cervical / Trunk Assessment Cervical / Trunk Assessment: Kyphotic   Communication Communication Communication: No difficulties   Cognition Arousal/Alertness: Awake/alert Behavior During Therapy: Flat affect Overall Cognitive Status: Difficult to assess                     General Comments       Exercises     Shoulder Instructions      Home Living Family/patient expects to be discharged to:: Skilled nursing facility                             Home Equipment: Wheelchair - manual          Prior Functioning/Environment Level of  Independence: Independent with assistive device(s)        Comments: Pt does not urinate due to dialysis, uses toilet for BM and shower chair for showers    OT Diagnosis: Generalized weakness;Cognitive deficits;Acute pain   OT Problem List:     OT Treatment/Interventions:      OT Goals(Current goals can be found in the care plan section) Acute Rehab OT Goals Patient Stated Goal: per chart, plan is for snf  OT Frequency:     Barriers to D/C:            Co-evaluation              End of Session Nurse Communication:  Patient requests pain meds  Activity Tolerance: No increased pain Patient left: in chair;with call bell/phone within reach   Time: 1455-1510 OT Time Calculation (min): 15 min Charges:  OT General Charges $OT Visit: 1 Procedure OT Evaluation $Initial OT Evaluation Tier I: 1 Procedure G-Codes:    Evern Bio 12/16/2014, 4:05 PM  [phone]

## 2014-12-16 NOTE — Progress Notes (Signed)
   Daily Progress Note  I had a long talk with the patient's daughter.  The patient had altered mental status at the time of conversation, likely from narcotics.  Her angiogram demonstrates inadequate perfusion in both legs.  She essentially needs B AKA.  The daughter notes that the patient previously has clearly stated she does not want any further amputations.  Subsequently, I discussed with the daughter considering a palliative approach to her situation: wound care, abx and pain medications.    The patient is also developing a significant sacral decubitus ulcer.  From family and friends, a clear downward spiral is evident so I doubt any quality of life improvement is going to be possible with further surgical intervention.      Leonides SakeBrian Chrisette Man, MD [facility] [place] Office: [phone] Pager: [phone]  12/16/2014, 9:32 AM

## 2014-12-16 NOTE — Progress Notes (Signed)
PROGRESS NOTE  Briana Gould ZOX:[id number] DOB: Jul 25, 1946 DOA: 12/07/2014 PCP: Laurena SlimmerLARK,PRESTON S, MD  HPI/Recap of past 3924 hours: 69 year old female with past medical history of severe peripheral vascular disease, end-stage renal disease on hemodialysis, aortic stenosis and diabetes mellitus who has previous history of bilateral critical limb ischemia and status post left BKA and this the past felt to be a non-operative candidate due to high risk. Patient brought in for nausea and vomiting and found to have severe cellulitis.  Started on broad-spectrum antibiotics. Seen by nephrology who continue to manage dialysis. Seen by vascular surgery. MRI ruled out osteomyelitis. After discussion with faster surgery, plan is for aortogram, bilateral leg runoff and likely patient will need endovascular intervention. Patient has been having more issues with some delirium and somnolence.  Assessment/Plan: Principal Problem:   Cellulitis secondary to severe peripheral vascular disease.  - will continue broad spectrum antibiotics - Patient per Vascular surgery needs B AKA but patient does not want any amputations. As such her condition is expected to deteriorate which I discussed with her daughter. Plan will be for palliative care to be involved for further planning moving forward and goals of care.  Acute encephalopathy: Resolving and most likely due to pain medication. Daughter and pain prefer to try different medication for pain control as such will place on tramadol  Active Problems:   Diabetes mellitus with end stage renal disease: Relatively well controlled   ESRD (end stage renal disease) on dialysis: Continue management as per dialysis  Somnolence: Medications reviewed, 60 more regard to patient's delirium which may be from underlying infection. Decreasing opiod regimen.  Code Status: Full code  Family Communication: Sister present  Disposition Plan:    Consultants:  Nephrology  Vascular  surgery  Cardiology  Procedures:  Planned aortogram  Hemodialysis as per nephrology  Antibiotics:  IV vancomycin 4/1-present  IV Zosyn 4/1-present   Objective: BP 94/32 mmHg  Pulse 63  Temp(Src) 98.2 F (36.8 C) (Oral)  Resp 14  Ht 5\' 5"  (1.651 m)  Wt 74.5 kg (164 lb 3.9 oz)  BMI 27.33 kg/m2  SpO2 100%  Intake/Output Summary (Last 24 hours) at 12/16/14 1710 Last data filed at 12/16/14 1115  Gross per 24 hour  Intake      0 ml  Output      0 ml  Net      0 ml   Filed Weights   12/16/14 0518 12/16/14 0700 12/16/14 1115  Weight: 70.3 kg (154 lb 15.7 oz) 74.7 kg (164 lb 10.9 oz) 74.5 kg (164 lb 3.9 oz)    Exam:   General:  Somnolent, confused  Cardiovascular: 3/6 holosystolic murmur  Respiratory: Clear to auscultation bilaterally, no wheezes  Abdomen: Soft, nontender, nondistended, positive bowel sounds  Musculoskeletal: Right lower extremity with secondary blistering and cellulitis and heel wound stage IV. Patient noted to have ulceration of left below-the-knee stump   Data Reviewed: Basic Metabolic Panel:  Recent Labs Lab 12/11/14 0727 12/13/14 0655 12/14/14 1559 12/15/14 0600 12/15/14 1528 12/16/14 0356  NA 133* 138 136 135  --  136  K 4.0 4.1 4.6 4.1  --  4.5  CL 96 98 96 97  --  98  CO2 23 28 26 21   --  26  GLUCOSE 139* 161* 181* 136*  --  202*  BUN 51* 51* 89* 43*  --  57*  CREATININE 5.21* 4.50* 6.16* 4.05* 4.82* 5.41*  CALCIUM 9.3 9.2 9.6 9.3  --  9.4  PHOS  --   --  5.3*  --   --   --    Liver Function Tests:  Recent Labs Lab 12/11/14 0727 12/13/14 0655 12/14/14 1559  AST 35 25  --   ALT 20 16  --   ALKPHOS 111 115  --   BILITOT 1.2 1.4*  --   PROT 6.7 7.0  --   ALBUMIN 2.6* 2.5* 2.4*   No results for input(s): LIPASE, AMYLASE in the last 168 hours.  Recent Labs Lab 12/15/14 1524  AMMONIA 36*   CBC:  Recent Labs Lab 12/13/14 0655 12/14/14 1559 12/15/14 0600 12/15/14 1528 12/16/14 0356  WBC 12.9* 15.4* 16.1*  17.4* 16.8*  HGB 10.4* 9.2* 8.6* 8.8* 9.0*  HCT 31.9* 27.3* 26.3* 26.6* 27.4*  MCV 97.3 95.1 97.0 96.4 97.2  PLT 293 339 297 336 360   Cardiac Enzymes:   No results for input(s): CKTOTAL, CKMB, CKMBINDEX, TROPONINI in the last 168 hours. BNP (last 3 results) No results for input(s): BNP in the last 8760 hours.  ProBNP (last 3 results) No results for input(s): PROBNP in the last 8760 hours.  CBG:  Recent Labs Lab 12/15/14 1254 12/15/14 1657 12/15/14 2213 12/16/14 0630 12/16/14 1138  GLUCAP 134* 136* 124* 179* 109*    Recent Results (from the past 240 hour(s))  Culture, blood (routine x 2)     Status: None   Collection Time: 12/07/14 11:31 AM  Result Value Ref Range Status   Specimen Description BLOOD HAND RIGHT  Final   Special Requests BOTTLES DRAWN AEROBIC AND ANAEROBIC 5CC  Final   Culture   Final    NO GROWTH 5 DAYS Performed at [facility]    Report Status 12/13/2014 FINAL  Final  Culture, blood (routine x 2)     Status: None   Collection Time: 12/07/14 11:43 AM  Result Value Ref Range Status   Specimen Description BLOOD RIGHT FOREARM  Final   Special Requests BOTTLES DRAWN AEROBIC AND ANAEROBIC 5CC  Final   Culture   Final    NO GROWTH 5 DAYS Performed at [facility]    Report Status 12/13/2014 FINAL  Final     Studies: No results found.  Scheduled Meds: . aspirin  81 mg Oral Daily  . atropine  1 application Left Eye Q1200  . calcitRIOL  1.25 mcg Oral Q M,W,F-HD  . ceFEPime (MAXIPIME) IV  2 g Intravenous Q M,W,F-HD  . collagenase  1 application Topical Once per day on Sun Tue Thu Sat  . docusate sodium  100 mg Oral BID  . famotidine  20 mg Oral QHS  . feeding supplement (NEPRO CARB STEADY)  237 mL Oral Q24H  . feeding supplement (PRO-STAT SUGAR FREE 64)  30 mL Oral TID  . heparin  5,000 Units Subcutaneous 3 times per day  . insulin aspart  0-5 Units Subcutaneous QHS  . insulin aspart  0-9 Units Subcutaneous TID WC  . metoprolol  tartrate  12.5 mg Oral Q M,W,F-HD  . midodrine  10 mg Oral Q M,W,F-HD  . midodrine  5 mg Oral BID WC  . multivitamin  1 tablet Oral QHS  . neomycin-bacitracin-polymyxin  1 application Topical Daily  . polyethylene glycol  17 g Oral Daily  . sevelamer carbonate  800 mg Oral TID WC  . sodium chloride  3 mL Intravenous Q12H  . sodium chloride  3 mL Intravenous Q12H  . vancomycin  750 mg Intravenous Q M,W,F-HD    Continuous Infusions:  Time spent: 30 minutes  Penny Pia  [facility] Pager [phone] If 7PM-7AM, please contact night-coverage at [email or web address], password [facility] 12/16/2014, 5:10 PM  LOS: 9 days

## 2014-12-16 NOTE — Progress Notes (Signed)
Physical Therapy Treatment Patient Details Name: Briana Gould MRN: [id number] DOB: Feb 02, 1946 Today's Date: 12/16/2014    History of Present Illness pt admittesd after she was too weak to go to dialysis.  In ER R LE showed wouunds and cellulitic redness..  Evaluation limited due to profound lethargy.    PT Comments    Limited participation today.  Follow Up Recommendations  SNF     Equipment Recommendations  None recommended by PT;Other (comment) (TBA at next venue)    Recommendations for Other Services       Precautions / Restrictions Precautions Precautions: Fall    Mobility  Bed Mobility Overal bed mobility: Needs Assistance Bed Mobility: Supine to Sit     Supine to sit: Total assist     General bed mobility comments: Again, pt did not participate well, but not as lethargic  Transfers Overall transfer level: Needs assistance Equipment used: 1 person hand held assist;2 person hand held assist Transfers: Sit to/from [employer] Sit to Stand: Total assist;+2 physical assistance   Squat pivot transfers: Total assist;+2 physical assistance     General transfer comment: pt did not offer much assist during stand or transfer.  Stood x3, 2 or which were for pericare.  Ambulation/Gait                 Stairs            Wheelchair Mobility    Modified Rankin (Stroke Patients Only)       Balance Overall balance assessment: Needs assistance Sitting-balance support: Single extremity supported;Bilateral upper extremity supported;Feet supported Sitting balance-Leahy Scale: Poor Sitting balance - Comments: Today tended to list posteriorly and stiffen up as if she felt she might slide off the bed..                            Cognition Arousal/Alertness: Lethargic Behavior During Therapy: Flat affect Overall Cognitive Status: Difficult to assess                      Exercises Other Exercises Other Exercises:  completed about 8 reps of hip/knee flexion/extension AAROM with graded gross extension resistance   R LE.  AA/PROM to L Knee and hip.    General Comments        Pertinent Vitals/Pain Pain Assessment: Faces Faces Pain Scale: Hurts even more Pain Descriptors / Indicators: Grimacing Pain Intervention(s): Limited activity within patient's tolerance;Repositioned    Home Living                      Prior Function            PT Goals (current goals can now be found in the care plan section) Acute Rehab PT Goals Patient Stated Goal: pt unable to participate PT Goal Formulation: Patient unable to participate in goal setting Time For Goal Achievement: 12/28/14 Potential to Achieve Goals: Fair Progress towards PT goals: Not progressing toward goals - comment (pt not able to or doesn't want to participate)    Frequency  Min 3X/week    PT Plan Current plan remains appropriate    Co-evaluation             End of Session   Activity Tolerance: Patient limited by fatigue;Other (comment) (limited participation) Patient left: in chair;with call bell/phone within reach     Time: 1335-1355 PT Time Calculation (min) (ACUTE ONLY): 20 min  Charges:  $  Therapeutic Activity: 8-22 mins                    G Codes:      Shandrell Boda, Eliseo GumKenneth V 12/16/2014, 3:10 PM 12/16/2014  [place] BingKen Keilon Ressel, PT [phone]  (pager)

## 2014-12-17 LAB — GLUCOSE, CAPILLARY
Glucose-Capillary: 186 mg/dL — ABNORMAL HIGH (ref 70–99)
Glucose-Capillary: 175 mg/dL — ABNORMAL HIGH (ref 70–99)
Glucose-Capillary: 167 mg/dL — ABNORMAL HIGH (ref 70–99)

## 2014-12-17 NOTE — Progress Notes (Signed)
[facility] Progress Note   Subjective: groggy, responsive but minimally interactive  Filed Vitals:   12/16/14 1115 12/16/14 1500 12/16/14 2034 12/17/14 0436  BP: 111/48 94/32 110/40 134/59  Pulse: 75 63 77 81  Temp: 98.2 F (36.8 C) 98.2 F (36.8 C) 99.1 F (37.3 C) 99.9 F (37.7 C)  TempSrc: Oral Oral Oral Oral  Resp: Height:      Weight: 74.5 kg (164 lb 3.9 oz)     SpO2: 100% 100% 100% 99%   Exam: Somnolent, arousable No jvd Chest mostly clear occ rales at bases RRR 2/6 murmur Abd soft, NTND, no mass or ascites R foot wrapped, R leg +edema , new papular/crusted lesions along R lat lower leg L BKA wrapped LUA AVF patent Neuro nonfocal, up in chair, somnolent, no myoclonic jerking  HD: [name] MWF 4h 72.5kg 2/2 Bath LUA AVF Heparin 5000 Mircera 75 q 4 wks last 3/23, Venofer 100 / HD, Calcitriol 1.25 ug TIW Pth 658 declining        Assessment: 1. Cellulitis RLE / R foot ulcers - on vanc/cefipime. LE angiogram severe bilat disease. Patient doesn't want amputations 2. ESRD HD MWF 3. Chronic hypotension - on midodrine 4. Volume is stable 5. Anemia cont ESA 6. MBD cont meds 7. Severe ASCVD / EF 40-45% / 3V CAD not surg candidate 8. DM 2  Plan - had dialysis yesterday.  Palliative care involvement pending.  Pain control main issue at this time.     Vinson Moselle MD  pager [phone]    cell [phone]  12/17/2014, 9:37 AM     Recent Labs Lab 12/14/14 1559 12/15/14 0600 12/15/14 1528 12/16/14 0356  NA 136 135  --  136  K 4.6 4.1  --  4.5  CL 96 97  --  98  CO2 26 21  --  26  GLUCOSE 181* 136*  --  202*  BUN 89* 43*  --  57*  CREATININE 6.16* 4.05* 4.82* 5.41*  CALCIUM 9.6 9.3  --  9.4  PHOS 5.3*  --   --   --     Recent Labs Lab 12/11/14 0727 12/13/14 0655 12/14/14 1559  AST 35 25  --   ALT 20 16  --   ALKPHOS 111 115  --   BILITOT 1.2 1.4*  --   PROT 6.7 7.0  --   ALBUMIN 2.6* 2.5* 2.4*    Recent Labs Lab  12/15/14 0600 12/15/14 1528 12/16/14 0356  WBC 16.1* 17.4* 16.8*  HGB 8.6* 8.8* 9.0*  HCT 26.3* 26.6* 27.4*  MCV 97.0 96.4 97.2  PLT 297 336 360   . aspirin  81 mg Oral Daily  . atropine  1 application Left Eye Q1200  . calcitRIOL  1.25 mcg Oral Q M,W,F-HD  . ceFEPime (MAXIPIME) IV  2 g Intravenous Q M,W,F-HD  . collagenase  1 application Topical Once per day on Sun Tue Thu Sat  . docusate sodium  100 mg Oral BID  . famotidine  20 mg Oral QHS  . feeding supplement (NEPRO CARB STEADY)  237 mL Oral Q24H  . feeding supplement (PRO-STAT SUGAR FREE 64)  30 mL Oral TID  . heparin  5,000 Units Subcutaneous 3 times per day  . insulin aspart  0-5 Units Subcutaneous QHS  . insulin aspart  0-9 Units Subcutaneous TID WC  . metoprolol tartrate  12.5 mg Oral Q M,W,F-HD  . midodrine  10 mg Oral Q M,W,F-HD  .  midodrine  5 mg Oral BID WC  . multivitamin  1 tablet Oral QHS  . neomycin-bacitracin-polymyxin  1 application Topical Daily  . polyethylene glycol  17 g Oral Daily  . sevelamer carbonate  800 mg Oral TID WC  . sodium chloride  3 mL Intravenous Q12H  . sodium chloride  3 mL Intravenous Q12H  . vancomycin  750 mg Intravenous Q M,W,F-HD     sodium chloride, sodium chloride, acetaminophen **OR** acetaminophen, ondansetron **OR** ondansetron (ZOFRAN) IV, sodium chloride, sodium chloride, traMADol

## 2014-12-17 NOTE — Progress Notes (Signed)
PROGRESS NOTE  Briana Gould ION:[id number] DOB: 12-18-1945 DOA: 12/07/2014 PCP: Laurena SlimmerLARK,PRESTON S, MD  HPI/Recap of past 2524 hours: 69 year old female with past medical history of severe peripheral vascular disease, end-stage renal disease on hemodialysis, aortic stenosis and diabetes mellitus who has previous history of bilateral critical limb ischemia and status post left BKA and this the past felt to be a non-operative candidate due to high risk. Patient brought in for nausea and vomiting and found to have severe cellulitis.  Started on broad-spectrum antibiotics. Seen by nephrology who continue to manage dialysis. Seen by vascular surgery. MRI ruled out osteomyelitis. After discussion with faster surgery, plan is for aortogram, bilateral leg runoff and likely patient will need endovascular intervention. Patient has been having more issues with some delirium and somnolence.  Assessment/Plan: Principal Problem:   Cellulitis secondary to severe peripheral vascular disease.  - will continue broad spectrum antibiotics - Patient per Vascular surgery needs B AKA but patient does not want any amputations.  - As per my conversation with daughter will consult palliative today for goals of care and symptom management. - suspect patient to either go home with home hospice or residential hospice  Acute encephalopathy: Resolving and most likely due to pain medication. - no new complaints reported while on tramadol  Active Problems:   Diabetes mellitus with end stage renal disease: Relatively well controlled   ESRD (end stage renal disease) on dialysis: Continue management as per dialysis  Somnolence: Improved with change of pain medication to tramadol  Code Status: Full code  Family Communication: discussed with daughter yesterday  Disposition Plan: pending GOC discussion   Consultants:  Nephrology  Vascular surgery  Cardiology  Palliative  Procedures:  Planned  aortogram  Hemodialysis as per nephrology  Antibiotics:  IV vancomycin 4/1-present  IV Zosyn 4/1-present   Objective: BP 134/59 mmHg  Pulse 81  Temp(Src) 99.9 F (37.7 C) (Oral)  Resp 17  Ht 5\' 5"  (1.651 m)  Wt 74.5 kg (164 lb 3.9 oz)  BMI 27.33 kg/m2  SpO2 99%  Intake/Output Summary (Last 24 hours) at 12/17/14 1144 Last data filed at 12/16/14 1700  Gross per 24 hour  Intake    120 ml  Output      0 ml  Net    120 ml   Filed Weights   12/16/14 0518 12/16/14 0700 12/16/14 1115  Weight: 70.3 kg (154 lb 15.7 oz) 74.7 kg (164 lb 10.9 oz) 74.5 kg (164 lb 3.9 oz)    Exam:   General:  Sleeping comfortably arrousable, pt in nad  Cardiovascular: 3/6 holosystolic murmur  Respiratory: Clear to auscultation bilaterally, no wheezes  Abdomen: Soft, nontender, nondistended, positive bowel sounds  Musculoskeletal: Right lower extremity with secondary blistering and cellulitis and heel wound stage IV. Patient noted to have ulceration of left below-the-knee stump   Data Reviewed: Basic Metabolic Panel:  Recent Labs Lab 12/11/14 0727 12/13/14 0655 12/14/14 1559 12/15/14 0600 12/15/14 1528 12/16/14 0356  NA 133* 138 136 135  --  136  K 4.0 4.1 4.6 4.1  --  4.5  CL 96 98 96 97  --  98  CO2 23 28 26 21   --  26  GLUCOSE 139* 161* 181* 136*  --  202*  BUN 51* 51* 89* 43*  --  57*  CREATININE 5.21* 4.50* 6.16* 4.05* 4.82* 5.41*  CALCIUM 9.3 9.2 9.6 9.3  --  9.4  PHOS  --   --  5.3*  --   --   --  Liver Function Tests:  Recent Labs Lab 12/11/14 0727 12/13/14 0655 12/14/14 1559  AST 35 25  --   ALT 20 16  --   ALKPHOS 111 115  --   BILITOT 1.2 1.4*  --   PROT 6.7 7.0  --   ALBUMIN 2.6* 2.5* 2.4*   No results for input(s): LIPASE, AMYLASE in the last 168 hours.  Recent Labs Lab 12/15/14 1524  AMMONIA 36*   CBC:  Recent Labs Lab 12/13/14 0655 12/14/14 1559 12/15/14 0600 12/15/14 1528 12/16/14 0356  WBC 12.9* 15.4* 16.1* 17.4* 16.8*  HGB 10.4*  9.2* 8.6* 8.8* 9.0*  HCT 31.9* 27.3* 26.3* 26.6* 27.4*  MCV 97.3 95.1 97.0 96.4 97.2  PLT 293 339 297 336 360   Cardiac Enzymes:   No results for input(s): CKTOTAL, CKMB, CKMBINDEX, TROPONINI in the last 168 hours. BNP (last 3 results) No results for input(s): BNP in the last 8760 hours.  ProBNP (last 3 results) No results for input(s): PROBNP in the last 8760 hours.  CBG:  Recent Labs Lab 12/16/14 0630 12/16/14 1138 12/16/14 1652 12/16/14 2108 12/17/14 1127  GLUCAP 179* 109* 166* 216* 186*    No results found for this or any previous visit (from the past 240 hour(s)).   Studies: No results found.  Scheduled Meds: . aspirin  81 mg Oral Daily  . atropine  1 application Left Eye Q1200  . calcitRIOL  1.25 mcg Oral Q M,W,F-HD  . ceFEPime (MAXIPIME) IV  2 g Intravenous Q M,W,F-HD  . collagenase  1 application Topical Once per day on Sun Tue Thu Sat  . docusate sodium  100 mg Oral BID  . famotidine  20 mg Oral QHS  . feeding supplement (NEPRO CARB STEADY)  237 mL Oral Q24H  . feeding supplement (PRO-STAT SUGAR FREE 64)  30 mL Oral TID  . heparin  5,000 Units Subcutaneous 3 times per day  . insulin aspart  0-5 Units Subcutaneous QHS  . insulin aspart  0-9 Units Subcutaneous TID WC  . metoprolol tartrate  12.5 mg Oral Q M,W,F-HD  . midodrine  10 mg Oral Q M,W,F-HD  . midodrine  5 mg Oral BID WC  . multivitamin  1 tablet Oral QHS  . neomycin-bacitracin-polymyxin  1 application Topical Daily  . polyethylene glycol  17 g Oral Daily  . sevelamer carbonate  800 mg Oral TID WC  . sodium chloride  3 mL Intravenous Q12H  . sodium chloride  3 mL Intravenous Q12H  . vancomycin  750 mg Intravenous Q M,W,F-HD    Continuous Infusions:    Time spent: 30 minutes  Penny Pia  [facility] Pager [phone] If 7PM-7AM, please contact night-coverage at [email or web address], password [facility] 12/17/2014, 11:44 AM  LOS: 10 days

## 2014-12-17 NOTE — Progress Notes (Signed)
ANTIBIOTIC CONSULT NOTE - FOLLOW UP  Pharmacy Consult for vancomycin and cefepime Indication: cellulitis in setting of severe PAD  Allergies  Allergen Reactions  . Phenergan [Promethazine Hcl] Other (See Comments)    Causes patient to" vomit more"  . Vitamin D Analogs Rash    She was able to take Hectorol 07/2014 admission and is now tolerating calcitriol 11/2014    Patient Measurements: Height:  (165.1 cm) Weight: 164 lb 3.9 oz (74.5 kg) IBW/kg (Calculated) : 57  Vital Signs: Temp: 99.9 F (37.7 C) (04/09 0436) Temp Source: Oral (04/09 0436) BP: 134/59 mmHg (04/09 0436) Pulse Rate: 81 (04/09 0436) Intake/Output from previous day: 04/08 0701 - 04/09 0700 In: 120 [P.O.:120] Out: 0  Intake/Output from this shift: Total I/O In: 60 [P.O.:60] Out: -   Labs:  Recent Labs  12/15/14 0600 12/15/14 1528 12/16/14 0356  WBC 16.1* 17.4* 16.8*  HGB 8.6* 8.8* 9.0*  PLT 297 336 360  CREATININE 4.05* 4.82* 5.41*   Estimated Creatinine Clearance: 9.9 mL/min (by C-G formula based on Cr of 5.41). No results for input(s): VANCOTROUGH, VANCOPEAK, VANCORANDOM, GENTTROUGH, GENTPEAK, GENTRANDOM, TOBRATROUGH, TOBRAPEAK, TOBRARND, AMIKACINPEAK, AMIKACINTROU, AMIKACIN in the last 72 hours.   Microbiology: Recent Results (from the past 720 hour(s))  Culture, blood (routine x 2)     Status: None   Collection Time: 12/07/14 11:31 AM  Result Value Ref Range Status   Specimen Description BLOOD HAND RIGHT  Final   Special Requests BOTTLES DRAWN AEROBIC AND ANAEROBIC 5CC  Final   Culture   Final    NO GROWTH 5 DAYS Performed at [facility]    Report Status 12/13/2014 FINAL  Final  Culture, blood (routine x 2)     Status: None   Collection Time: 12/07/14 11:43 AM  Result Value Ref Range Status   Specimen Description BLOOD RIGHT FOREARM  Final   Special Requests BOTTLES DRAWN AEROBIC AND ANAEROBIC 5CC  Final   Culture   Final    NO GROWTH 5 DAYS Performed at [facility]    Report Status 12/13/2014 FINAL  Final    Anti-infectives    Start     Dose/Rate Route Frequency Ordered Stop   12/09/14 1200  vancomycin (VANCOCIN) IVPB 750 mg/150 ml premix     750 mg 150 mL/hr over 60 Minutes Intravenous Every M-W-F (Hemodialysis) 12/08/14 1001     12/09/14 1200  ceFEPIme (MAXIPIME) 2 g in dextrose 5 % 50 mL IVPB     2 g 100 mL/hr over 30 Minutes Intravenous Every M-W-F (Hemodialysis) 12/08/14 1001     12/08/14 1015  vancomycin (VANCOCIN) 1,250 mg in sodium chloride 0.9 % 250 mL IVPB     1,250 mg 166.7 mL/hr over 90 Minutes Intravenous NOW 12/08/14 1001 12/08/14 1248   12/08/14 1015  ceFEPIme (MAXIPIME) 1 g in dextrose 5 % 50 mL IVPB     1 g 100 mL/hr over 30 Minutes Intravenous NOW 12/08/14 1001 12/08/14 1118   12/07/14 1600  metroNIDAZOLE (FLAGYL) IVPB 500 mg  Status:  Discontinued     500 mg 100 mL/hr over 60 Minutes Intravenous Every 8 hours 12/07/14 1532 12/08/14 0934   12/07/14 1600  ceFEPIme (MAXIPIME) 1 g in dextrose 5 % 50 mL IVPB  Status:  Discontinued     1 g 100 mL/hr over 30 Minutes Intravenous Every 24 hours 12/07/14 1535 12/08/14 1001   12/07/14 1200  vancomycin (VANCOCIN) IVPB 1000 mg/200 mL premix  1,000 mg 200 mL/hr over 60 Minutes Intravenous  Once 12/07/14 1145 12/07/14 1357      Assessment: 69 y/o female with ESRD on HD MWF who continues on day 11 vancomycin and cefepime for RLE cellulitis and non healing ulcers due to severe PAD. Patient needs AKA but does not want amputation. Tmax is 99.9, WBC are 16.8. Palliative care consulted for goals of care and symptom management.  Goal of Therapy:  pre-HD vancomycin level 15-25 mcg/ml  Plan:  - Continue vancomycin 750 mg IV qHD MWF - Continue cefepime 2 g IV qHD MWF - Will f/u plan for antibiotics  [facility] University Behavioral Medicine CenterJennifer Mindenmines, Pharm.D., BCPS Clinical Pharmacist Pager: [phone] 12/17/2014 1:42 PM

## 2014-12-17 NOTE — Progress Notes (Signed)
CSW attempting to reach patient's daughter [place] to provide SNF bed offers; spoke to son-in-law [place] and [place] SNF offers in place- however- patient is a resident [place] and [place] dialysis in [place]. Thus- per ok of son-in-law- SNF search initiated in [place] as well.  Active bed search continues. CSW attempted to visit 2 times- she was sleeping soundly both times and did not arouse when CSW touched her arm and called her name. [place], [place] [phone]

## 2014-12-18 DIAGNOSIS — L03115 Cellulitis of right lower limb: Principal | ICD-10-CM

## 2014-12-18 LAB — GLUCOSE, CAPILLARY
GLUCOSE-CAPILLARY: 204 mg/dL — AB (ref 70–99)
Glucose-Capillary: 154 mg/dL — ABNORMAL HIGH (ref 70–99)
Glucose-Capillary: 149 mg/dL — ABNORMAL HIGH (ref 70–99)

## 2014-12-18 NOTE — Progress Notes (Signed)
[facility] Progress Note   Subjective: more appropriate today but still falls asleep in conversation  Filed Vitals:   12/17/14 0436 12/17/14 1424 12/17/14 2030 12/18/14 0611  BP: 134/59 133/58 135/75 131/65  Pulse: 81 79 76 75  Temp: 99.9 F (37.7 C) 99.8 F (37.7 C) 98.7 F (37.1 C) 97.5 F (36.4 C)  TempSrc: Oral Oral Oral Oral  Resp: 17 18 18 18   Height:      Weight:      SpO2: 99% 96% 100% 100%   Exam: Somnolent, arousable No jvd Chest mostly clear RRR 2/6 murmur Abd soft, NTND, no mass or ascites R foot w post-lateral ulcer 2.5x 2.5 cm, deep, necrotic w foul odor; dermal heel necrosis w/o ulceration Minimal RLE edema, no other edema L BKA wrapped LUA AVF patent Neuro nonfocal, groggy  HD: [name] MWF 4h 72.5kg 2/2 Bath LUA AVF Heparin 5000 Mircera 75 q 4 wks last 3/23, Venofer 100 / HD, Calcitriol 1.25 ug TIW Pth 658 declining        Assessment: 1. Cellulitis RLE / R foot ulcers - on vanc/cefipime. LE angiogram severe bilat disease. Patient doesn't want amputations. May need hospice Rx.  Awaiting pall care input.    2. ESRD HD MWF 3. Chronic hypotension - on midodrine 4. Volume is stable, not eating much 5. Anemia cont ESA 6. MBD cont meds 7. Severe ASCVD / EF 40-45% / 3V CAD not surg candidate 8. DM 2  Plan - HD Monday, UF 1-2kg    Vinson Moselleob Liston Thum MD  pager [phone]    cell [phone]  12/18/2014, 10:14 AM     Recent Labs Lab 12/14/14 1559 12/15/14 0600 12/15/14 1528 12/16/14 0356  NA 136 135  --  136  K 4.6 4.1  --  4.5  CL 96 97  --  98  CO2 26 21  --  26  GLUCOSE 181* 136*  --  202*  BUN 89* 43*  --  57*  CREATININE 6.16* 4.05* 4.82* 5.41*  CALCIUM 9.6 9.3  --  9.4  PHOS 5.3*  --   --   --     Recent Labs Lab 12/13/14 0655 12/14/14 1559  AST 25  --   ALT 16  --   ALKPHOS 115  --   BILITOT 1.4*  --   PROT 7.0  --   ALBUMIN 2.5* 2.4*    Recent Labs Lab 12/15/14 0600 12/15/14 1528 12/16/14 0356  WBC  16.1* 17.4* 16.8*  HGB 8.6* 8.8* 9.0*  HCT 26.3* 26.6* 27.4*  MCV 97.0 96.4 97.2  PLT 297 336 360   . aspirin  81 mg Oral Daily  . atropine  1 application Left Eye Q1200  . calcitRIOL  1.25 mcg Oral Q M,W,F-HD  . ceFEPime (MAXIPIME) IV  2 g Intravenous Q M,W,F-HD  . collagenase  1 application Topical Once per day on Sun Tue Thu Sat  . docusate sodium  100 mg Oral BID  . famotidine  20 mg Oral QHS  . feeding supplement (NEPRO CARB STEADY)  237 mL Oral Q24H  . feeding supplement (PRO-STAT SUGAR FREE 64)  30 mL Oral TID  . heparin  5,000 Units Subcutaneous 3 times per day  . insulin aspart  0-5 Units Subcutaneous QHS  . insulin aspart  0-9 Units Subcutaneous TID WC  . metoprolol tartrate  12.5 mg Oral Q M,W,F-HD  . midodrine  10 mg Oral Q M,W,F-HD  . midodrine  5 mg Oral BID  WC  . multivitamin  1 tablet Oral QHS  . neomycin-bacitracin-polymyxin  1 application Topical Daily  . polyethylene glycol  17 g Oral Daily  . sevelamer carbonate  800 mg Oral TID WC  . sodium chloride  3 mL Intravenous Q12H  . sodium chloride  3 mL Intravenous Q12H  . vancomycin  750 mg Intravenous Q M,W,F-HD     sodium chloride, sodium chloride, acetaminophen **OR** acetaminophen, ondansetron **OR** ondansetron (ZOFRAN) IV, sodium chloride, sodium chloride, traMADol

## 2014-12-18 NOTE — Progress Notes (Signed)
PROGRESS NOTE  Briana Gould ONG:[id number] DOB: 1946/02/17 DOA: 12/07/2014 PCP: [name], MD  HPI/Recap of past 24 hours: 69 year old female with past medical history of severe peripheral vascular disease, end-stage renal disease on hemodialysis, aortic stenosis and diabetes mellitus who has previous history of bilateral critical limb ischemia and status post left BKA and this the past felt to be a non-operative candidate due to high risk. Patient brought in for nausea and vomiting and found to have severe cellulitis.  Started on broad-spectrum antibiotics. Seen by nephrology who continue to manage dialysis. Seen by vascular surgery. MRI ruled out osteomyelitis. After discussion with faster surgery, plan is for aortogram, bilateral leg runoff and likely patient will need endovascular intervention. Patient has been having more issues with some delirium and somnolence.  Assessment/Plan: Principal Problem:   Cellulitis secondary to severe peripheral vascular disease.  - will continue broad spectrum antibiotics - Patient per Vascular surgery needs B AKA but patient does not want any amputations.  - Discussed with daughter for prognosis given patient's decision to avoid amputations. At this juncture consideration should be for home hospice versus residential hospice. We also discussed discontinuation of dialysis given that patient's condition is expected to deteriorate. Daughter would like to discuss this with mother today to make further decisions on disposition. - Awaiting disposition decision by patient and family  Acute encephalopathy:  -Improved with de-escalation of opioid medication -Also most likely affected by active infectious etiology  Active Problems:   Diabetes mellitus with end stage renal disease: Relatively well controlled   ESRD (end stage renal disease) on dialysis:  - Nephrology on board and managing   Code Status: Full code  Family Communication: discussed with  daughter at bedside  Disposition Plan: pending GOC discussion   Consultants:  Nephrology  Vascular surgery  Cardiology  Palliative  Procedures:  Planned aortogram  Hemodialysis as per nephrology  Antibiotics:  IV vancomycin 4/1-present  IV Zosyn 4/1-present   Objective: BP 131/65 mmHg  Pulse 75  Temp(Src) 97.5 F (36.4 C) (Oral)  Resp 18  Ht  (1.651 m)  Wt 74.5 kg (164 lb 3.9 oz)  BMI 27.33 kg/m2  SpO2 100%  Intake/Output Summary (Last 24 hours) at 12/18/14 1131 Last data filed at 12/18/14 0700  Gross per 24 hour  Intake      0 ml  Output      0 ml  Net      0 ml   Filed Weights   12/16/14 0518 12/16/14 0700 12/16/14 1115  Weight: 70.3 kg (154 lb 15.7 oz) 74.7 kg (164 lb 10.9 oz) 74.5 kg (164 lb 3.9 oz)    Exam:   General:  Sleeping comfortably arrousable, pt in nad  Cardiovascular: 3/6 holosystolic murmur, no rubs  Respiratory: Clear to auscultation bilaterally, no wheezes  Abdomen: Soft, nontender, nondistended, positive bowel sounds  Musculoskeletal: Right lower extremity with secondary blistering and cellulitis and heel wound stage IV. Patient noted to have ulceration of left below-the-knee stump   Data Reviewed: Basic Metabolic Panel:  Recent Labs Lab 12/13/14 0655 12/14/14 1559 12/15/14 0600 12/15/14 1528 12/16/14 0356  NA 138 136 135  --  136  K 4.1 4.6 4.1  --  4.5  CL 98 96 97  --  98  CO2 --  26  GLUCOSE 161* 181* 136*  --  202*  BUN 51* 89* 43*  --  57*  CREATININE 4.50* 6.16* 4.05* 4.82* 5.41*  CALCIUM 9.2 9.6  9.3  --  9.4  PHOS  --  5.3*  --   --   --    Liver Function Tests:  Recent Labs Lab 12/13/14 0655 12/14/14 1559  AST 25  --   ALT 16  --   ALKPHOS 115  --   BILITOT 1.4*  --   PROT 7.0  --   ALBUMIN 2.5* 2.4*   No results for input(s): LIPASE, AMYLASE in the last 168 hours.  Recent Labs Lab 12/15/14 1524  AMMONIA 36*   CBC:  Recent Labs Lab 12/13/14 0655 12/14/14 1559  12/15/14 0600 12/15/14 1528 12/16/14 0356  WBC 12.9* 15.4* 16.1* 17.4* 16.8*  HGB 10.4* 9.2* 8.6* 8.8* 9.0*  HCT 31.9* 27.3* 26.3* 26.6* 27.4*  MCV 97.3 95.1 97.0 96.4 97.2  PLT 293 339 297 336 360   Cardiac Enzymes:   No results for input(s): CKTOTAL, CKMB, CKMBINDEX, TROPONINI in the last 168 hours. BNP (last 3 results) No results for input(s): BNP in the last 8760 hours.  ProBNP (last 3 results) No results for input(s): PROBNP in the last 8760 hours.  CBG:  Recent Labs Lab 12/16/14 2108 12/17/14 1127 12/17/14 1620 12/17/14 2121 12/18/14 0610  GLUCAP 216* 186* 175* 167* 204*    No results found for this or any previous visit (from the past 240 hour(s)).   Studies: No results found.  Scheduled Meds: . aspirin  81 mg Oral Daily  . atropine  1 application Left Eye Q1200  . calcitRIOL  1.25 mcg Oral Q M,W,F-HD  . ceFEPime (MAXIPIME) IV  2 g Intravenous Q M,W,F-HD  . collagenase  1 application Topical Once per day on Sun Tue Thu Sat  . docusate sodium  100 mg Oral BID  . famotidine  20 mg Oral QHS  . feeding supplement (NEPRO CARB STEADY)  237 mL Oral Q24H  . feeding supplement (PRO-STAT SUGAR FREE 64)  30 mL Oral TID  . heparin  5,000 Units Subcutaneous 3 times per day  . insulin aspart  0-5 Units Subcutaneous QHS  . insulin aspart  0-9 Units Subcutaneous TID WC  . metoprolol tartrate  12.5 mg Oral Q M,W,F-HD  . midodrine  10 mg Oral Q M,W,F-HD  . midodrine  5 mg Oral BID WC  . multivitamin  1 tablet Oral QHS  . neomycin-bacitracin-polymyxin  1 application Topical Daily  . polyethylene glycol  17 g Oral Daily  . sevelamer carbonate  800 mg Oral TID WC  . sodium chloride  3 mL Intravenous Q12H  . sodium chloride  3 mL Intravenous Q12H  . vancomycin  750 mg Intravenous Q M,W,F-HD    Continuous Infusions:    Time spent: 30 minutes  Penny PiaVEGA, Mikenzi Raysor  [facility] Pager [phone] If 7PM-7AM, please contact night-coverage at [email or web address], password  [email or web address] 12/18/2014, 11:31 AM  LOS: 11 days

## 2014-12-18 NOTE — Progress Notes (Signed)
Palliative Medicine Team consult was received. We will schedule a meeting with patient and her family at the earliest possible time we have a provider available. Anticipated time to consult 4/11. If there are urgent needs or questions please call [phone]. Thank you for consulting out team to assist with this patients care.  Anderson MaltaElizabeth Keeleigh Terris, DO Palliative Medicine [phone]

## 2014-12-19 DIAGNOSIS — I999 Unspecified disorder of circulatory system: Secondary | ICD-10-CM

## 2014-12-19 DIAGNOSIS — M79672 Pain in left foot: Secondary | ICD-10-CM

## 2014-12-19 DIAGNOSIS — Z515 Encounter for palliative care: Secondary | ICD-10-CM

## 2014-12-19 DIAGNOSIS — I998 Other disorder of circulatory system: Secondary | ICD-10-CM

## 2014-12-19 DIAGNOSIS — M79671 Pain in right foot: Secondary | ICD-10-CM

## 2014-12-19 LAB — GLUCOSE, CAPILLARY
GLUCOSE-CAPILLARY: 146 mg/dL — AB (ref 70–99)
GLUCOSE-CAPILLARY: 186 mg/dL — AB (ref 70–99)
Glucose-Capillary: 199 mg/dL — ABNORMAL HIGH (ref 70–99)
Glucose-Capillary: 155 mg/dL — ABNORMAL HIGH (ref 70–99)

## 2014-12-19 LAB — VANCOMYCIN, RANDOM: Vancomycin Rm: 19.5 ug/mL

## 2014-12-19 MED ORDER — ALTEPLASE 2 MG IJ SOLR
2.0000 mg | Freq: Once | INTRAMUSCULAR | Status: DC | PRN
  Filled 2014-12-19: qty 2

## 2014-12-19 MED ORDER — HYDROMORPHONE HCL 1 MG/ML IJ SOLN
0.2500 mg | INTRAMUSCULAR | Status: DC | PRN
  Filled 2014-12-19: qty 1

## 2014-12-19 MED ORDER — HEPARIN SODIUM (PORCINE) 1000 UNIT/ML DIALYSIS
5000.0000 [IU] | Freq: Once | INTRAMUSCULAR | Status: DC
  Filled 2014-12-19: qty 5

## 2014-12-19 MED ORDER — PENTAFLUOROPROP-TETRAFLUOROETH EX AERO: 1.0000 "application " | INHALATION_SPRAY | CUTANEOUS | Status: DC | PRN

## 2014-12-19 MED ORDER — LIDOCAINE HCL (PF) 1 % IJ SOLN: 5.0000 mL | INTRAMUSCULAR | Status: DC | PRN

## 2014-12-19 MED ORDER — SORBITOL 70 % SOLN
30.0000 mL | Freq: Every day | Status: DC | PRN
  Filled 2014-12-19: qty 30

## 2014-12-19 MED ORDER — CEFEPIME HCL 2 G IJ SOLR
250.0000 mg | INTRAMUSCULAR | Status: DC
  Filled 2014-12-19: qty 0.25

## 2014-12-19 MED ORDER — SODIUM CHLORIDE 0.9 % IV SOLN: 100.0000 mL | INTRAVENOUS | Status: DC | PRN

## 2014-12-19 MED ORDER — LIDOCAINE-PRILOCAINE 2.5-2.5 % EX CREA
1.0000 | TOPICAL_CREAM | CUTANEOUS | Status: DC | PRN
Start: 2014-12-19 — End: 2014-12-19
  Filled 2014-12-19: qty 5

## 2014-12-19 MED ORDER — BISACODYL 10 MG RE SUPP: 10.0000 mg | Freq: Every day | RECTAL | Status: DC | PRN

## 2014-12-19 MED ORDER — HEPARIN SODIUM (PORCINE) 1000 UNIT/ML DIALYSIS
1000.0000 [IU] | INTRAMUSCULAR | Status: DC | PRN
  Filled 2014-12-19: qty 1

## 2014-12-19 MED ORDER — NEPRO/CARBSTEADY PO LIQD
237.0000 mL | ORAL | Status: DC | PRN
  Filled 2014-12-19: qty 237

## 2014-12-19 NOTE — Consult Note (Signed)
Patient XB:MWUX:Briana Gould      DOB: Feb 20, 1946      LKG:[id number]     [facility]    Consult Requested by: Briana Gould     PCP: Briana SlimmerLARK,Briana S, MD Reason for Consultation: GOC     Phone Number:[phone]  Assessment of patients Current state: Briana Gould is resting and does not participate in conversation (unsure how much she is listening). Daughter, Briana Gould, is at bedside. I have worked with this family before. Briana Gould shares with me that she had a good discussion with her mother yesterday who shared with her that she does NOT want amputation and she is ready to stop dialysis and focus on comfort care. Briana Gould says that she appeared at peace and accepting and "is ready to go home." Her husband died recently and Briana Gould believes she is ready to join him in heaven. We discussed her time as days to 1-2 weeks and discussed hospice facility vs home with hospice. Briana Gould will ponder and discuss with her family the best option for them.   Another complicating issue is that [name] and her husband have been raising and caring for their 3 grandchildren ages 569-15 and Briana Gould is caring for them now but has 3 children of her own and some of them have special needs as well. There is a lot of stress and I recommend hospice facility due to all these complicating factors but [facility].    Goals of Care: 1.  Code Status: DNR - confirmed with Briana Harmanana.    2. Disposition: Home with hospice vs hospice facility.    3. Symptom Management:   1. Pain d/t ischemic right leg: Continue tramadol. Recommend dilaudid 0.25 mg IV every 2 hours prn to cover for neuropathic pain as well (says it hurts just to touch sheet). I softly touched her left shin for a second and she jerked in pain to the touch. She is in fetal position and grimaces often. Oxycodone makes her nauseous, morphine not a good choice with her renal failure. For better pain control with oral  medication could consider methadone but will try dilaudid first.  2. Bowel Regimen: Continue colace. D/C Miralax as she was not taking. Will add sorbitol prn and may add as scheduled daily if no bowel movement today.  3. Acute encephalopathy: This is likely d/t pain that is poorly controlled. However, this will likely worsen the longer she goes without dialysis - I shared this with family at bedside.    4. Psychosocial: Emotional support provided to patient and family at bedside.   5. Spiritual: Consider chaplain consult.    Brief HPI: 69 year old female with past medical history of severe peripheral vascular disease, end-stage renal disease on hemodialysis, aortic stenosis and diabetes mellitus who has previous history of bilateral critical limb ischemia and status post left BKA and this the past felt to be a non-operative candidate due to high risk. Patient brought in for nausea and vomiting and found to have severe cellulitis. Started on broad-spectrum antibiotics. Seen by nephrology who continue to manage dialysis. Seen by vascular surgery. MRI ruled out osteomyelitis. After discussion with vascular surgery she has decided not to pursue amputation. She has also decided to terminate dialysis at this point with goals for comfort at end of life. PMH reviewed.    ROS: She is very lethargic and does not answer my questions although she has clear signs of pain/discomfort.  PMH:  Past Medical History  Diagnosis Date  . PAD (peripheral artery disease)     a. L BKA  . Type II diabetes mellitus   . Anemia   . History of blood transfusion 2002    "related to amputation" (08/13/2013)  . End stage renal disease on dialysis     "[facility]; M, W, F" (08/13/2013)  . DVT (deep venous thrombosis)     a. R IJ DVT 08/2013.  Marland Kitchen CVA (cerebral infarction)     a. MRI 08/2013: possible left motor strip infarct with remote pontine infarct.  . Pulmonary HTN     a. Cath 2011 - mod to severe  pulmonary hypertension. b. Echo 08/2013: PAP (mild-mod increased).  . Thrombocytopenia   . CAD (coronary artery disease)     a. Severe diffuse diabetic disease by cath 2011, for med rx, not a candidate for CABG or PCI at that time.  . Aortic stenosis     mild  . Syncope     a. 2011, etiology never clear.  . Carotid artery disease     a. Dopp 16/1096: 1-39% LICA< 60-79% RICA borderline >80%.  . Cerebrovascular disease     a. 08/2013 MRA brain: multiple bilateral moderate and high grade intracranial stenoses  . Cardiac arrest 08/14/13    a. Possible arrest in setting of hypotension during dialysis with possible arrest requiring brief CPR, not intubated and was fully intact after episode. Suspected due to low cerebral perfusion in setting of hypotension. Unclear if any arrhythmia.  . End stage renal disease on dialysis   . Peripheral arterial occlusive disease   . Hypertension   . Diabetes mellitus type 2, uncontrolled, with complications   . DVT (deep venous thrombosis)      Right internal jugular vein  . CAD (coronary artery disease), native coronary artery 2011    Diffuse distal vessel CAD noted at cardiac catheterization  . MI (myocardial infarction)   . Stroke   . GIB (gastrointestinal bleeding)   . CHF (congestive heart failure)   . Cellulitis and abscess of leg 11/2014    rt leg     PSH: Past Surgical History  Procedure Laterality Date  . Av fistula placement Left 2011    upper arm  . Below knee leg amputation Left 2002  . Tonsillectomy    . Cataract extraction w/ intraocular lens  implant, bilateral Bilateral 2012-2014  . Esophagogastroduodenoscopy N/A 08/30/2013    Procedure: ESOPHAGOGASTRODUODENOSCOPY (EGD);  Surgeon: Theda Belfast, MD;  Location: [facility] ENDOSCOPY;  Service: Endoscopy;  Laterality: N/A;  . Below knee leg amputation Left   . Av fistula placement Left   . Left and right heart catheterization with coronary angiogram Right 11/22/2013    Procedure: LEFT  AND RIGHT HEART CATHETERIZATION WITH CORONARY ANGIOGRAM;  Surgeon: Peter M Swaziland, MD;  Location: [facility] CATH LAB;  Service: Cardiovascular;  Laterality: Right;  . Abdominal aortagram N/A 12/15/2014    Procedure: ABDOMINAL Ronny Flurry;  Surgeon: Fransisco Hertz, MD;  Location: [facility] CATH LAB;  Service: Cardiovascular;  Laterality: N/A;   I have reviewed the FH and SH and  If appropriate update it with new information. Allergies  Allergen Reactions  . Phenergan [Promethazine Hcl] Other (See Comments)    Causes patient to" vomit more"  . Vitamin D Analogs Rash    She was able to take Hectorol 07/2014 admission and is now tolerating calcitriol 11/2014   Scheduled Meds: . aspirin  81 mg Oral  Daily  . atropine  1 application Left Eye Q1200  . calcitRIOL  1.25 mcg Oral Q M,W,F-HD  . ceFEPime (MAXIPIME) IV  2 g Intravenous Q M,W,F-HD  . collagenase  1 application Topical Once per day on Sun Tue Thu Sat  . docusate sodium  100 mg Oral BID  . famotidine  20 mg Oral QHS  . feeding supplement (NEPRO CARB STEADY)  237 mL Oral Q24H  . feeding supplement (PRO-STAT SUGAR FREE 64)  30 mL Oral TID  . heparin  5,000 Units Subcutaneous 3 times per day  . insulin aspart  0-5 Units Subcutaneous QHS  . insulin aspart  0-9 Units Subcutaneous TID WC  . metoprolol tartrate  12.5 mg Oral Q M,W,F-HD  . midodrine  10 mg Oral Q M,W,F-HD  . midodrine  5 mg Oral BID WC  . multivitamin  1 tablet Oral QHS  . neomycin-bacitracin-polymyxin  1 application Topical Daily  . polyethylene glycol  17 g Oral Daily  . sevelamer carbonate  800 mg Oral TID WC  . sodium chloride  3 mL Intravenous Q12H  . vancomycin  750 mg Intravenous Q M,W,F-HD   Continuous Infusions:  PRN Meds:.sodium chloride, acetaminophen **OR** acetaminophen, ondansetron **OR** ondansetron (ZOFRAN) IV, sodium chloride, traMADol    BP 122/54 mmHg  Pulse 79  Temp(Src) 98.9 F (37.2 C) (Oral)  Resp 18  Ht 5\' 5"  (1.651 m)  Wt 74.5 kg (164 lb 3.9 oz)  BMI 27.33  kg/m2  SpO2 100%   PPS: 10%   Intake/Output Summary (Last 24 hours) at 12/19/14 1053 Last data filed at 12/19/14 0746  Gross per 24 hour  Intake    298 ml  Output      0 ml  Net    298 ml   LBM: 4/9  Physical Exam:  General: Lying in bed, fetal position, grimacing, ill appearing HEENT: [place]/AT, no JVD Chest: CTA throughout, no labored breathing, room air CVS: RRR, S1 S2, murmur Abdomen: Soft, NT, ND, +BS Ext: Old Lt BKA dressing CDI, Rt leg tender to slightest touch Neuro: Somnolent, unable to assess orientation  Labs: CBC    Component Value Date/Time   WBC 16.8* 12/16/2014 0356   RBC 2.82* 12/16/2014 0356   RBC 3.30* 09/02/2010 0650   HGB 9.0* 12/16/2014 0356   HCT 27.4* 12/16/2014 0356   PLT 360 12/16/2014 0356   MCV 97.2 12/16/2014 0356   MCH 31.9 12/16/2014 0356   MCHC 32.8 12/16/2014 0356   RDW 18.1* 12/16/2014 0356   LYMPHSABS 0.7 12/07/2014 1143   MONOABS 0.8 12/07/2014 1143   EOSABS 0.0 12/07/2014 1143   BASOSABS 0.0 12/07/2014 1143    BMET    Component Value Date/Time   NA 136 12/16/2014 0356   K 4.5 12/16/2014 0356   CL 98 12/16/2014 0356   CO2 26 12/16/2014 0356   GLUCOSE 202* 12/16/2014 0356   BUN 57* 12/16/2014 0356   CREATININE 5.41* 12/16/2014 0356   CALCIUM 9.4 12/16/2014 0356   GFRNONAA 7* 12/16/2014 0356   GFRAA 8* 12/16/2014 0356    CMP     Component Value Date/Time   NA 136 12/16/2014 0356   K 4.5 12/16/2014 0356   CL 98 12/16/2014 0356   CO2 26 12/16/2014 0356   GLUCOSE 202* 12/16/2014 0356   BUN 57* 12/16/2014 0356   CREATININE 5.41* 12/16/2014 0356   CALCIUM 9.4 12/16/2014 0356   PROT 7.0 12/13/2014 0655   ALBUMIN 2.4* 12/14/2014 1559   AST  25 12/13/2014 0655   ALT 16 12/13/2014 0655   ALKPHOS 115 12/13/2014 0655   BILITOT 1.4* 12/13/2014 0655   GFRNONAA 7* 12/16/2014 0356   GFRAA 8* 12/16/2014 0356     Time In Time Out Total Time Spent with Patient Total Overall Time  0940 1100     Greater than 50%   of this time was spent counseling and coordinating care related to the above assessment and plan.  [name], NP Palliative Medicine Team Pager # [phone] (M-F 8a-5p) Team Phone # [phone] (Nights/Weekends)

## 2014-12-19 NOTE — Progress Notes (Signed)
PROGRESS NOTE  Tasmin S Ashe AUQ:[id number] DOB: 1946/08/21 DOA: 12/07/2014 PCP: Foye Spurling, MD  Brief Narrative: 69 year old female with past medical history of severe peripheral vascular disease, end-stage renal disease on hemodialysis, aortic stenosis and diabetes mellitus who has previous history of bilateral critical limb ischemia and status post left BKA and this the past felt to be a non-operative candidate due to high risk. Patient brought in for nausea and vomiting and found to have severe cellulitis.  Started on broad-spectrum antibiotics. Seen by nephrology who continue to manage dialysis. Seen by vascular surgery. MRI ruled out osteomyelitis. After discussion with faster surgery, plan is for aortogram, bilateral leg runoff and likely patient will need endovascular intervention.  Assessment/Plan: Principal Problem:   Cellulitis secondary to severe peripheral vascular disease.  - will continue broad spectrum antibiotics - Patient per Vascular surgery needs B AKA but patient does not want any amputations.  - Awaiting disposition decision by patient and family, palliative has met with family and plans are to discontinue dialysis sessions.  Acute encephalopathy:  -Improved with de-escalation of opioid medication -Also most likely affected by active infectious etiology  Active Problems:   Diabetes mellitus with end stage renal disease: Relatively well controlled, continue current management.    ESRD (end stage renal disease) on dialysis:  - Dialysis will be discontinued since patient condition expected to deteriorate,    Code Status: Full code  Family Communication: discussed with daughter at bedside  Disposition Plan: pending [place] discussion   Consultants:  Nephrology  Vascular surgery  Cardiology  Palliative  Procedures:  Planned aortogram  Hemodialysis as per nephrology  Antibiotics:  IV vancomycin 4/1-present  IV Zosyn  4/1-present   Objective: BP 142/65 mmHg  Pulse 84  Temp(Src) 98.6 F (37 C) (Oral)  Resp 17  Ht 5' 5"  (1.651 m)  Wt 74.5 kg (164 lb 3.9 oz)  BMI 27.33 kg/m2  SpO2 99%  Intake/Output Summary (Last 24 hours) at 12/19/14 1517 Last data filed at 12/19/14 1300  Gross per 24 hour  Intake    238 ml  Output      0 ml  Net    238 ml   Filed Weights   12/16/14 0518 12/16/14 0700 12/16/14 1115  Weight: 70.3 kg (154 lb 15.7 oz) 74.7 kg (164 lb 10.9 oz) 74.5 kg (164 lb 3.9 oz)    Exam:   General:  Sleeping comfortably arrousable, pt in nad  Cardiovascular: 3/6 holosystolic murmur, no rubs  Respiratory: Clear to auscultation bilaterally, no wheezes, no increased wob  Abdomen: Soft, nontender, nondistended, positive bowel sounds  Musculoskeletal: Right lower extremity with secondary blistering and cellulitis and heel wound stage IV. Patient noted to have ulceration of left below-the-knee stump   Data Reviewed: Basic Metabolic Panel:  Recent Labs Lab 12/13/14 0655 12/14/14 1559 12/15/14 0600 12/15/14 1528 12/16/14 0356  NA 138 136 135  --  136  K 4.1 4.6 4.1  --  4.5  CL 98 96 97  --  98  CO2 28 26 21   --  26  GLUCOSE 161* 181* 136*  --  202*  BUN 51* 89* 43*  --  57*  CREATININE 4.50* 6.16* 4.05* 4.82* 5.41*  CALCIUM 9.2 9.6 9.3  --  9.4  PHOS  --  5.3*  --   --   --    Liver Function Tests:  Recent Labs Lab 12/13/14 0655 12/14/14 1559  AST 25  --   ALT 16  --  ALKPHOS 115  --   BILITOT 1.4*  --   PROT 7.0  --   ALBUMIN 2.5* 2.4*   No results for input(s): LIPASE, AMYLASE in the last 168 hours.  Recent Labs Lab 12/15/14 1524  AMMONIA 36*   CBC:  Recent Labs Lab 12/13/14 0655 12/14/14 1559 12/15/14 0600 12/15/14 1528 12/16/14 0356  WBC 12.9* 15.4* 16.1* 17.4* 16.8*  HGB 10.4* 9.2* 8.6* 8.8* 9.0*  HCT 31.9* 27.3* 26.3* 26.6* 27.4*  MCV 97.3 95.1 97.0 96.4 97.2  PLT 293 339 297 336 360   Cardiac Enzymes:   No results for input(s): CKTOTAL,  CKMB, CKMBINDEX, TROPONINI in the last 168 hours. BNP (last 3 results) No results for input(s): BNP in the last 8760 hours.  ProBNP (last 3 results) No results for input(s): PROBNP in the last 8760 hours.  CBG:  Recent Labs Lab 12/18/14 0610 12/18/14 1623 12/18/14 2044 12/19/14 0638 12/19/14 1114  GLUCAP 204* 154* 149* 199* 155*    No results found for this or any previous visit (from the past 240 hour(s)).   Studies: No results found.  Scheduled Meds: . atropine  1 application Left Eye [id number]  . [START ON 12/20/2014] ceFEPime (MAXIPIME) IV  250 mg Intravenous Q24H  . collagenase  1 application Topical Once per day on Sun Tue Thu Sat  . docusate sodium  100 mg Oral BID  . famotidine  20 mg Oral QHS  . feeding supplement (NEPRO CARB STEADY)  237 mL Oral Q24H  . feeding supplement (PRO-STAT SUGAR FREE 64)  30 mL Oral TID  . insulin aspart  0-5 Units Subcutaneous QHS  . insulin aspart  0-9 Units Subcutaneous TID WC  . midodrine  5 mg Oral BID WC  . neomycin-bacitracin-polymyxin  1 application Topical Daily  . sodium chloride  3 mL Intravenous Q12H    Continuous Infusions:    Time spent: 35 minutes  Velvet Bathe  [facility] Pager [phone] If 7PM-7AM, please contact night-coverage at [email or web address], password [email or web address] 12/19/2014, 3:17 PM  LOS: 12 days

## 2014-12-19 NOTE — Progress Notes (Addendum)
Addendum: Nursing informed me that family has requested that they are stopping dialysis and making the patient more comfortable. Will d/c scheduled vanc/cefepime.   If antibiotics are to continue will dose vancomycin based on random levels, as level was at goal this am another level will not be due for quite some time. Will dose adjust cefepime, no dose today.  12/19/2014 11:39 AM  ANTIBIOTIC CONSULT NOTE - FOLLOW UP  Pharmacy Consult for vancomycin and cefepime Indication: cellulitis in setting of severe PAD  Allergies  Allergen Reactions  . Phenergan [Promethazine Hcl] Other (See Comments)    Causes patient to" vomit more"  . Vitamin D Analogs Rash    She was able to take Hectorol 07/2014 admission and is now tolerating calcitriol 11/2014    Patient Measurements: Height: 5\' 5"  (165.1 cm) Weight: 164 lb 3.9 oz (74.5 kg) IBW/kg (Calculated) : 57  Vital Signs: Temp: 98.9 F (37.2 C) (04/11 0557) Temp Source: Oral (04/11 0557) BP: 122/54 mmHg (04/11 0557) Pulse Rate: 79 (04/11 0557) Intake/Output from previous day: 04/10 0701 - 04/11 0700 In: 178 [P.O.:178] Out: 0  Intake/Output from this shift: Total I/O In: 120 [P.O.:120] Out: -   Labs: No results for input(s): WBC, HGB, PLT, LABCREA, CREATININE in the last 72 hours. Estimated Creatinine Clearance: 9.9 mL/min (by C-G formula based on Cr of 5.41).  Recent Labs  12/19/14 0920  VANCORANDOM 19.5     Microbiology: Recent Results (from the past 720 hour(s))  Culture, blood (routine x 2)     Status: None   Collection Time: 12/07/14 11:31 AM  Result Value Ref Range Status   Specimen Description BLOOD HAND RIGHT  Final   Special Requests BOTTLES DRAWN AEROBIC AND ANAEROBIC 5CC  Final   Culture   Final    NO GROWTH 5 DAYS Performed at [facility]    Report Status 12/13/2014 FINAL  Final  Culture, blood (routine x 2)     Status: None   Collection Time: 12/07/14 11:43 AM  Result Value Ref Range Status   Specimen Description BLOOD RIGHT FOREARM  Final   Special Requests BOTTLES DRAWN AEROBIC AND ANAEROBIC 5CC  Final   Culture   Final    NO GROWTH 5 DAYS Performed at [facility]    Report Status 12/13/2014 FINAL  Final    Anti-infectives    Start     Dose/Rate Route Frequency Ordered Stop   12/09/14 1200  vancomycin (VANCOCIN) IVPB 750 mg/150 ml premix     750 mg 150 mL/hr over 60 Minutes Intravenous Every M-W-F (Hemodialysis) 12/08/14 1001     12/09/14 1200  ceFEPIme (MAXIPIME) 2 g in dextrose 5 % 50 mL IVPB     2 g 100 mL/hr over 30 Minutes Intravenous Every M-W-F (Hemodialysis) 12/08/14 1001     12/08/14 1015  vancomycin (VANCOCIN) 1,250 mg in sodium chloride 0.9 % 250 mL IVPB     1,250 mg 166.7 mL/hr over 90 Minutes Intravenous NOW 12/08/14 1001 12/08/14 1248   12/08/14 1015  ceFEPIme (MAXIPIME) 1 g in dextrose 5 % 50 mL IVPB     1 g 100 mL/hr over 30 Minutes Intravenous NOW 12/08/14 1001 12/08/14 1118   12/07/14 1600  metroNIDAZOLE (FLAGYL) IVPB 500 mg  Status:  Discontinued     500 mg 100 mL/hr over 60 Minutes Intravenous Every 8 hours 12/07/14 1532 12/08/14 0934   12/07/14 1600  ceFEPIme (MAXIPIME) 1 g in dextrose 5 % 50 mL IVPB  Status:  Discontinued     1 g 100 mL/hr over 30 Minutes Intravenous Every 24 hours 12/07/14 1535 12/08/14 1001   12/07/14 1200  vancomycin (VANCOCIN) IVPB 1000 mg/200 mL premix     1,000 mg 200 mL/hr over 60 Minutes Intravenous  Once 12/07/14 1145 12/07/14 1357      Assessment: 69 y/o female with ESRD on HD MWF who continues on day 11 vancomycin and cefepime for RLE cellulitis and non healing ulcers due to severe PAD. Patient needs AKA but does not want amputation. Tmax is 98.9, WBC are 16.8.  Palliative care consulted for goals of care and symptom management.  Appears that dialysis plans are pending per renal note.  Vancomycin level checked this am as she is usually scheduled MWF - resulted at 19.5 which is within the therapeutic  range for a dialysis patient. Vancomycin will not be needed today if HD not performed. Will continue to follow. No change needed in abx at this time.  Goal of Therapy:  pre-HD vancomycin level 15-25 mcg/ml  Plan:  - Continue vancomycin 750 mg IV qHD MWF  - Continue cefepime 2 g IV qHD MWF -Hold abx if patient does not go to HD - Will f/u plan for antibiotics  Sheppard Coil PharmD., BCPS Clinical Pharmacist Pager [phone] 12/19/2014 11:20 AM

## 2014-12-19 NOTE — Progress Notes (Signed)
[facility] ROUNDING NOTE   Subjective:   Interval History: appears to be very weak  Objective:  Vital signs in last 24 hours:  Temp:  [97.6 F (36.4 C)-98.9 F (37.2 C)] 98.9 F (37.2 C) (04/11 0557) Pulse Rate:  [75-81] 79 (04/11 0557) Resp:  [18-19] 18 (04/11 0557) BP: (104-124)/(52-54) 122/54 mmHg (04/11 0557) SpO2:  [98 %-100 %] 100 % (04/11 0557)  Weight change:  Filed Weights   12/16/14 0518 12/16/14 0700 12/16/14 1115  Weight: 70.3 kg (154 lb 15.7 oz) 74.7 kg (164 lb 10.9 oz) 74.5 kg (164 lb 3.9 oz)    Intake/Output: I/O last 3 completed shifts: In: 178 [P.O.:178] Out: 0    Intake/Output this shift:     CVS- RRR RS- CTA ABD- BS present soft non-distended EXT- no edema R foot w post-lateral ulcer 2.5x 2.5 cm, deep, necrotic w foul odor; dermal heel necrosis w/o ulceration   Basic Metabolic Panel:  Recent Labs Lab 12/13/14 0655 12/14/14 1559 12/15/14 0600 12/15/14 1528 12/16/14 0356  NA 138 136 135  --  136  K 4.1 4.6 4.1  --  4.5  CL 98 96 97  --  98  CO2 28 26 21   --  26  GLUCOSE 161* 181* 136*  --  202*  BUN 51* 89* 43*  --  57*  CREATININE 4.50* 6.16* 4.05* 4.82* 5.41*  CALCIUM 9.2 9.6 9.3  --  9.4  PHOS  --  5.3*  --   --   --     Liver Function Tests:  Recent Labs Lab 12/13/14 0655 12/14/14 1559  AST 25  --   ALT 16  --   ALKPHOS 115  --   BILITOT 1.4*  --   PROT 7.0  --   ALBUMIN 2.5* 2.4*   No results for input(s): LIPASE, AMYLASE in the last 168 hours.  Recent Labs Lab 12/15/14 1524  AMMONIA 36*    CBC:  Recent Labs Lab 12/13/14 0655 12/14/14 1559 12/15/14 0600 12/15/14 1528 12/16/14 0356  WBC 12.9* 15.4* 16.1* 17.4* 16.8*  HGB 10.4* 9.2* 8.6* 8.8* 9.0*  HCT 31.9* 27.3* 26.3* 26.6* 27.4*  MCV 97.3 95.1 97.0 96.4 97.2  PLT 293 339 297 336 360    Cardiac Enzymes: No results for input(s): CKTOTAL, CKMB, CKMBINDEX, TROPONINI in the last 168 hours.  BNP: Invalid input(s):  POCBNP  CBG:  Recent Labs Lab 12/17/14 2121 12/18/14 0610 12/18/14 1623 12/18/14 2044 12/19/14 0638  GLUCAP 167* 204* 154* 149* 199*    Microbiology: Results for orders placed or performed during the hospital encounter of 12/07/14  Culture, blood (routine x 2)     Status: None   Collection Time: 12/07/14 11:31 AM  Result Value Ref Range Status   Specimen Description BLOOD HAND RIGHT  Final   Special Requests BOTTLES DRAWN AEROBIC AND ANAEROBIC 5CC  Final   Culture   Final    NO GROWTH 5 DAYS Performed at [facility]    Report Status 12/13/2014 FINAL  Final  Culture, blood (routine x 2)     Status: None   Collection Time: 12/07/14 11:43 AM  Result Value Ref Range Status   Specimen Description BLOOD RIGHT FOREARM  Final   Special Requests BOTTLES DRAWN AEROBIC AND ANAEROBIC 5CC  Final   Culture   Final    NO GROWTH 5 DAYS Performed at [facility]    Report Status 12/13/2014 FINAL  Final    Coagulation Studies: No  results for input(s): LABPROT, INR in the last 72 hours.  Urinalysis: No results for input(s): COLORURINE, LABSPEC, PHURINE, GLUCOSEU, HGBUR, BILIRUBINUR, KETONESUR, PROTEINUR, UROBILINOGEN, NITRITE, LEUKOCYTESUR in the last 72 hours.  Invalid input(s): APPERANCEUR    Imaging: No results found.   Medications:     . aspirin  81 mg Oral Daily  . atropine  1 application Left Eye Q1200  . calcitRIOL  1.25 mcg Oral Q M,W,F-HD  . ceFEPime (MAXIPIME) IV  2 g Intravenous Q M,W,F-HD  . collagenase  1 application Topical Once per day on Sun Tue Thu Sat  . docusate sodium  100 mg Oral BID  . famotidine  20 mg Oral QHS  . feeding supplement (NEPRO CARB STEADY)  237 mL Oral Q24H  . feeding supplement (PRO-STAT SUGAR FREE 64)  30 mL Oral TID  . heparin  5,000 Units Subcutaneous 3 times per day  . insulin aspart  0-5 Units Subcutaneous QHS  . insulin aspart  0-9 Units Subcutaneous TID WC  . metoprolol tartrate  12.5 mg Oral Q M,W,F-HD  .  midodrine  10 mg Oral Q M,W,F-HD  . midodrine  5 mg Oral BID WC  . multivitamin  1 tablet Oral QHS  . neomycin-bacitracin-polymyxin  1 application Topical Daily  . polyethylene glycol  17 g Oral Daily  . sevelamer carbonate  800 mg Oral TID WC  . sodium chloride  3 mL Intravenous Q12H  . sodium chloride  3 mL Intravenous Q12H  . vancomycin  750 mg Intravenous Q M,W,F-HD   sodium chloride, sodium chloride, acetaminophen **OR** acetaminophen, ondansetron **OR** ondansetron (ZOFRAN) IV, sodium chloride, sodium chloride, traMADol  Assessment/ Plan:  1. Cellulitis RLE / R foot ulcers - on vanc/cefipime. LE angiogram severe bilat disease. Patient doesn't want amputations. May need hospice Rx. Awaiting pall care input.  2. ESRD HD MWF 3. Chronic hypotension - on midodrine 4. Volume is stable, not eating much 5. Anemia cont ESA 6. MBD cont meds 7. Severe ASCVD / EF 40-45% / 3V CAD not surg candidate 8. DM 2  Plan - attempted to call Chaya Jan ( daughter ) to make decisions about dialysis . Brownie was unable to express her wishes to me    LOS: 12 Shalisha Clausing W  :16 AM

## 2014-12-19 NOTE — Progress Notes (Signed)
Utilization review completed.  

## 2014-12-19 NOTE — Progress Notes (Signed)
Medicare Important Message given? YES  (If response is "NO", the following Medicare IM given date fields will be blank)  Date Medicare IM given: 12/19/14 Medicare IM given by:  Kaire Stary  

## 2014-12-20 DIAGNOSIS — M79609 Pain in unspecified limb: Secondary | ICD-10-CM

## 2014-12-20 LAB — GLUCOSE, CAPILLARY
GLUCOSE-CAPILLARY: 218 mg/dL — AB (ref 70–99)
GLUCOSE-CAPILLARY: 163 mg/dL — AB (ref 70–99)
Glucose-Capillary: 161 mg/dL — ABNORMAL HIGH (ref 70–99)

## 2014-12-20 MED ORDER — SORBITOL 70 % SOLN
30.0000 mL | Freq: Every day | Status: DC
  Administered 2014-12-20 – 2014-12-21 (×2): 30 mL via ORAL
  Filled 2014-12-20 (×2): qty 30

## 2014-12-20 NOTE — Progress Notes (Addendum)
Progress Note [facility] Pall[facility] Medicine Team [facility]  Subjective: Ms. Harvie HeckValdes awakens more when I spoke with her but falls asleep easily. She says she is in pain and will accept something else for pain - will continue dilaudid prn. I spoke with Annabelle Harmanana and I will d/c antibiotics and focus more on comfort medications. [name] is still considering home vs hospice facility. I do recommend hospice facility as there are many other issues that may make caring for her at home difficult.    Objective: Allergies  Allergen Reactions  . Phenergan [Promethazine Hcl] Other (See Comments)    Causes patient to" vomit more"  . Vitamin D Analogs Rash    She was able to take Hectorol 07/2014 admission and is now tolerating calcitriol 11/2014   Scheduled Meds: . atropine  1 application Left Eye Q1200  . collagenase  1 application Topical Once per day on Sun Tue Thu Sat  . docusate sodium  100 mg Oral BID  . famotidine  20 mg Oral QHS  . feeding supplement (NEPRO CARB STEADY)  237 mL Oral Q24H  . feeding supplement (PRO-STAT SUGAR FREE 64)  30 mL Oral TID  . neomycin-bacitracin-polymyxin  1 application Topical Daily  . sodium chloride  3 mL Intravenous Q12H  . sorbitol  30 mL Oral Daily   Continuous Infusions:  PRN Meds:.sodium chloride, acetaminophen **OR** acetaminophen, bisacodyl, HYDROmorphone (DILAUDID) injection, ondansetron **OR** ondansetron (ZOFRAN) IV, sodium chloride, traMADol  BP 141/64 mmHg  Pulse 87  Temp(Src) 98.4 F (36.9 C) (Oral)  Resp 18  Ht 5\' 5"  (1.651 m)  Wt 74.5 kg (164 lb 3.9 oz)  BMI 27.33 kg/m2  SpO2 100%   PPS: 10%   Intake/Output Summary (Last 24 hours) at 12/20/14 1326 Last data filed at 12/20/14 0802  Gross per 24 hour  Intake    270 ml  Output      0 ml  Net    270 ml      LBM: 4/9  Physical Exam:  General: Lying in bed, fetal position, ill appearing HEENT: [place]/AT, no JVD Chest: CTA throughout, no labored breathing, room air CVS: RRR, S1 S2,  murmur Abdomen: Soft, NT, ND, +BS Ext: Old Lt BKA dressing CDI, Rt leg tender to slightest touch Neuro: Lethargic, oriented to person  Labs: CBC    Component Value Date/Time   WBC 16.8* 12/16/2014 0356   RBC 2.82* 12/16/2014 0356   RBC 3.30* 09/02/2010 0650   HGB 9.0* 12/16/2014 0356   HCT 27.4* 12/16/2014 0356   PLT 360 12/16/2014 0356   MCV 97.2 12/16/2014 0356   MCH 31.9 12/16/2014 0356   MCHC 32.8 12/16/2014 0356   RDW 18.1* 12/16/2014 0356   LYMPHSABS 0.7 12/07/2014 1143   MONOABS 0.8 12/07/2014 1143   EOSABS 0.0 12/07/2014 1143   BASOSABS 0.0 12/07/2014 1143    BMET    Component Value Date/Time   NA 136 12/16/2014 0356   K 4.5 12/16/2014 0356   CL 98 12/16/2014 0356   CO2 26 12/16/2014 0356   GLUCOSE 202* 12/16/2014 0356   BUN 57* 12/16/2014 0356   CREATININE 5.41* 12/16/2014 0356   CALCIUM 9.4 12/16/2014 0356   GFRNONAA 7* 12/16/2014 0356   GFRAA 8* 12/16/2014 0356    CMP     Component Value Date/Time   NA 136 12/16/2014 0356   K 4.5 12/16/2014 0356   CL 98 12/16/2014 0356   CO2 26 12/16/2014 0356   GLUCOSE 202* 12/16/2014 0356  BUN 57* 12/16/2014 0356   CREATININE 5.41* 12/16/2014 0356   CALCIUM 9.4 12/16/2014 0356   PROT 7.0 12/13/2014 0655   ALBUMIN 2.4* 12/14/2014 1559   AST 25 12/13/2014 0655   ALT 16 12/13/2014 0655   ALKPHOS 115 12/13/2014 0655   BILITOT 1.4* 12/13/2014 0655   GFRNONAA 7* 12/16/2014 0356   GFRAA 8* 12/16/2014 0356     Assessment and Plan: 1. Code Status: DNR 2. Symptom Control: 1. Pain d/t ischemic right leg: Continue tramadol. Dilaudid 0.25 mg IV every 2 hours prn. Encouraged patient and family to utilize.  2. Bowel Regimen: Continue colace. Sorbitol scheduled daily.  3. Acute encephalopathy: This is likely d/t pain that is poorly controlled. However, this will likely worsen the longer she goes without dialysis - I shared this with family at bedside.  3. Psycho/Social: Emotional support provided to patient and to  family.  4. Disposition: I would recommend hospice facility.     Time In Time Out Total Time Spent with Patient Total Overall Time  1300 1320     Greater than 50%  of this time was spent counseling and coordinating care related to the above assessment and plan.  [name], NP Palliative Medicine Team Pager # [phone] (M-F 8a-5p) Team Phone # [phone] (Nights/Weekends)

## 2014-12-20 NOTE — Progress Notes (Signed)
PROGRESS NOTE  Briana Gould ZOX:[id number] DOB: [birth year] DOA: 12/07/2014 PCP: [name], MD  Brief Narrative: 69 year old female with past medical history of severe peripheral vascular disease, end-stage renal disease on hemodialysis, aortic stenosis and diabetes mellitus who has previous history of bilateral critical limb ischemia and status post left BKA and this the past felt to be a non-operative candidate due to high risk. Patient brought in for nausea and vomiting and found to have severe cellulitis.  Started on broad-spectrum antibiotics. Seen by nephrology who continue to manage dialysis. Seen by vascular surgery. MRI ruled out osteomyelitis. After discussion with faster surgery, plan is for aortogram, bilateral leg runoff and likely patient will need endovascular intervention. Patient did not want amputations and currently plan is for palliative and residential hospice on discharge  Assessment/Plan: Principal Problem:   Cellulitis secondary to severe peripheral vascular disease.  - will continue broad spectrum antibiotics - Patient per Vascular surgery needs B AKA but patient does not want any amputations.  - Plans are for residential hospice on discharge  Acute encephalopathy:   -Improved with de-escalation of opioid medication -Also most likely affected by active infectious etiology  Active Problems:   Diabetes mellitus with end stage renal disease: Relatively well controlled, continue current management.    ESRD (end stage renal disease) on dialysis:  - Dialysis will be discontinued since patient condition expected to deteriorate, this has been discussed with family   Code Status: Full code  Family Communication: discussed with daughter at bedside  Disposition Plan: Residential hospice   Consultants:  Nephrology  Vascular surgery  Cardiology  Palliative  Procedures:  Planned aortogram  Hemodialysis as per nephrology  Antibiotics:  IV vancomycin  4/1-present  IV Zosyn 4/1-present   Objective: BP 126/60 mmHg  Pulse 86  Temp(Src) 98.6 F (37 C) (Oral)  Resp 16  Ht  (1.651 m)  Wt 74.5 kg (164 lb 3.9 oz)  BMI 27.33 kg/m2  SpO2 95%  Intake/Output Summary (Last 24 hours) at 12/20/14 1620 Last data filed at 12/20/14 0802  Gross per 24 hour  Intake    270 ml  Output      0 ml  Net    270 ml   Filed Weights   12/16/14 0518 12/16/14 0700 12/16/14 1115  Weight: 70.3 kg (154 lb 15.7 oz) 74.7 kg (164 lb 10.9 oz) 74.5 kg (164 lb 3.9 oz)    Exam:   General:  Sleeping comfortably arrousable, pt in nad  Cardiovascular: 3/6 holosystolic murmur, no rubs  Respiratory: Clear to auscultation bilaterally, no wheezes, no increased wob  Abdomen: Soft, nontender, nondistended, positive bowel sounds  Musculoskeletal: Right lower extremity with secondary blistering and cellulitis and heel wound stage IV. Patient noted to have ulceration of left below-the-knee stump   Data Reviewed: Basic Metabolic Panel:  Recent Labs Lab 12/14/14 1559 12/15/14 0600 12/15/14 1528 12/16/14 0356  NA 136 135  --  136  K 4.6 4.1  --  4.5  CL 96 97  --  98  CO2 26 21  --  26  GLUCOSE 181* 136*  --  202*  BUN 89* 43*  --  57*  CREATININE 6.16* 4.05* 4.82* 5.41*  CALCIUM 9.6 9.3  --  9.4  PHOS 5.3*  --   --   --    Liver Function Tests:  Recent Labs Lab 12/14/14 1559  ALBUMIN 2.4*   No results for input(s): LIPASE, AMYLASE in the last 168 hours.  Recent Labs  Lab 12/15/14 1524  AMMONIA 36*   CBC:  Recent Labs Lab 12/14/14 1559 12/15/14 0600 12/15/14 1528 12/16/14 0356  WBC 15.4* 16.1* 17.4* 16.8*  HGB 9.2* 8.6* 8.8* 9.0*  HCT 27.3* 26.3* 26.6* 27.4*  MCV 95.1 97.0 96.4 97.2  PLT 339 297 336 360   Cardiac Enzymes:   No results for input(s): CKTOTAL, CKMB, CKMBINDEX, TROPONINI in the last 168 hours. BNP (last 3 results) No results for input(s): BNP in the last 8760 hours.  ProBNP (last 3 results) No results for  input(s): PROBNP in the last 8760 hours.  CBG:  Recent Labs Lab 12/19/14 0638 12/19/14 1114 12/19/14 1624 12/19/14 2121 12/20/14 1125  GLUCAP 199* 155* 146* 186* 218*    No results found for this or any previous visit (from the past 240 hour(s)).   Studies: No results found.  Scheduled Meds: . atropine  1 application Left Eye Q1200  . collagenase  1 application Topical Once per day on Sun Tue Thu Sat  . docusate sodium  100 mg Oral BID  . famotidine  20 mg Oral QHS  . feeding supplement (NEPRO CARB STEADY)  237 mL Oral Q24H  . feeding supplement (PRO-STAT SUGAR FREE 64)  30 mL Oral TID  . neomycin-bacitracin-polymyxin  1 application Topical Daily  . sodium chloride  3 mL Intravenous Q12H  . sorbitol  30 mL Oral Daily    Continuous Infusions:    Time spent: 10 minutes  Penny PiaVEGA, [facility] [place]  [facility] Pager [phone] If 7PM-7AM, please contact night-coverage at [email or web address], password [email or web address] 12/20/2014, 4:20 PM  LOS: 13 days

## 2014-12-20 NOTE — Progress Notes (Signed)
Foam dsg changed to left stump.

## 2014-12-20 NOTE — Clinical Social Work Note (Signed)
CSW spoke with palliative physician who states that pt dtr is now leaning towards residential hospice.  CSW spoke with pt dtr briefly who confirmed that residential hospice seems like the best plan due to lack of family availability at home to take care of pt needs.  Pt dtr was at appt and unable to have choice conversation with CSW- CSW followed up with pt son-in law who states [facility] this point with [facility] and [facility] as back ups.  CSW will continue to follow.  Merlyn LotJenna Holoman, LCSWA Clinical Social Worker [phone]

## 2014-12-20 NOTE — Progress Notes (Signed)
Attempted to feed pt, gave pt bit of food, pt feel asleep while eating food. Gave pt drink of tea in which pt started coughing. Pt then went back to sleep, pt not awake enough and just goes to sleep when trying to arouse her. Do not feel like it is safe enough to feed pt until she wakes up better. Hiram Combererek Diya Gervasi RN

## 2014-12-21 DIAGNOSIS — I999 Unspecified disorder of circulatory system: Secondary | ICD-10-CM

## 2014-12-21 DIAGNOSIS — L03115 Cellulitis of right lower limb: Secondary | ICD-10-CM | POA: Insufficient documentation

## 2014-12-21 DIAGNOSIS — M79671 Pain in right foot: Secondary | ICD-10-CM

## 2014-12-21 LAB — GLUCOSE, CAPILLARY
GLUCOSE-CAPILLARY: 163 mg/dL — AB (ref 70–99)
GLUCOSE-CAPILLARY: 194 mg/dL — AB (ref 70–99)

## 2014-12-21 MED ORDER — NEPRO/CARBSTEADY PO LIQD: 237.0000 mL | ORAL | Status: AC

## 2014-12-21 MED ORDER — BISACODYL 10 MG RE SUPP: 10.0000 mg | Freq: Every day | RECTAL | Status: AC | PRN

## 2014-12-21 MED ORDER — TRAMADOL HCL 50 MG PO TABS: 50.0000 mg | ORAL_TABLET | Freq: Four times a day (QID) | ORAL | Status: AC | PRN

## 2014-12-21 MED ORDER — HYDROMORPHONE HCL 1 MG/ML IJ SOLN: 0.2500 mg | INTRAMUSCULAR | Status: AC | PRN

## 2014-12-21 MED ORDER — DOCUSATE SODIUM 100 MG PO CAPS: 100.0000 mg | ORAL_CAPSULE | Freq: Two times a day (BID) | ORAL | Status: AC | PRN

## 2014-12-21 NOTE — Clinical Social Work Note (Signed)
CSW spoke with pt dtr- [facility] as first choice confirmed with dtr- she will be in the hospital at 9:30am-   [facility] to follow up with family.  CSW will continue to follow.  Merlyn LotJenna Holoman, LCSWA Clinical Social Worker [phone]

## 2014-12-21 NOTE — Discharge Summary (Signed)
Physician Discharge Summary  Briana Gould WUJ:[id number] DOB: 1946-03-24 DOA: 12/07/2014  PCP: [name], MD  Admit date: 12/07/2014 Discharge date: 12/21/2014  Time spent: > 35 minutes  Recommendations for Outpatient Follow-up:  1. 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- medical Rx   CVA (cerebral infarction)   Leukocytosis   Acute encephalopathy   Palliative care encounter   Ischemic pain of right foot   Discharge Condition: stable  Diet recommendation: diabetic/renal diet  Filed Weights   12/16/14 0518 12/16/14 0700 12/16/14 1115  Weight: 70.3 kg (154 lb 15.7 oz) 74.7 kg (164 lb 10.9 oz) 74.5 kg (164 lb 3.9 oz)    History of present illness:  69 year old female with past medical history of severe peripheral vascular disease, end-stage renal disease on hemodialysis, aortic stenosis and diabetes mellitus who has previous history of bilateral critical limb ischemia and status post left BKA and this the past felt to be a non-operative candidate due to high risk. Patient brought in for nausea and vomiting and found to have severe cellulitis. Started on broad-spectrum antibiotics. Seen by nephrology who continue to manage dialysis until it was decided to transition patient to hospice care. Seen by vascular surgery. MRI ruled out osteomyelitis. After discussion with vascular surgery, plan was for aortogram, bilateral leg runoff and likely patient will need endovascular intervention. Patient did not want amputations and currently plan is for palliative and residential hospice on discharge  Hospital Course:  Cellulitis secondary to severe peripheral vascular disease.   - Patient per Vascular surgery needs B AKA but patient does not want  any amputations.  - Plans are for residential hospice on discharge  Acute encephalopathy:  -Improved with de-escalation of opioid medication -Also most likely affected by active infectious etiology  Active Problems:  Diabetes mellitus with end stage renal disease:  - will continue home medication regimen on d/c but would like for blood sugars to be monitored at facility. If blood sugars ever below 70 diabetic medication (Amaryl) should be discontinued.   ESRD (end stage renal disease) on dialysis:  - Dialysis will be discontinued since patient condition expected to deteriorate, this has been discussed with family   Procedures:  Please refer to notes from vascular surgeon  Consultations:  Vascular surgeon  Nephrology  Palliative  Discharge Exam: Filed Vitals:   12/21/14 0416  BP: 153/71  Pulse: 92  Temp: 99.3 F (37.4 C)  Resp: 17    General: Pt in nad, alert and awake Cardiovascular: rrr, no rubs Respiratory: cta bl, no increased wob, equal chest rise.  Discharge Instructions   Discharge Instructions    Call MD for:  extreme fatigue    Complete by:  As directed      Call MD for:  redness, tenderness, or signs of infection (pain, swelling, redness, odor or green/yellow discharge around incision site)    Complete by:  As directed      Call MD for:  temperature >100.4    Complete by:  As directed      Diet - low sodium heart healthy    Complete by:  As directed      Discharge instructions    Complete by:  As directed   Please monitor blood sugars daily  atleast 2 times per day (fasting and postprandial). If blood sugars below 70 discontinue Amaryl and make sure patient eats snack or consumes juice.   Please have her pcp at  Facility continue comfort care measures.     Increase activity slowly    Complete by:  As directed           Current Discharge Medication List    START taking these medications   Details  bisacodyl (DULCOLAX) 10 MG suppository  Place 1 suppository (10 mg total) rectally daily as needed for moderate constipation. Qty: 12 suppository, Refills: 0    HYDROmorphone (DILAUDID) 1 MG/ML injection Inject 0.25 mLs (0.25 mg total) into the vein every 2 (two) hours as needed for moderate pain. Qty: 1 mL, Refills: 0    traMADol (ULTRAM) 50 MG tablet Take 1-2 tablets (50-100 mg total) by mouth every 6 (six) hours as needed for moderate pain or severe pain. Qty: 30 tablet, Refills: 0      CONTINUE these medications which have CHANGED   Details  docusate sodium (COLACE) 100 MG capsule Take 1 capsule (100 mg total) by mouth 2 (two) times daily as needed for mild constipation. Qty: 10 capsule, Refills: 0    Nutritional Supplements (FEEDING SUPPLEMENT, NEPRO CARB STEADY,) LIQD Take 237 mLs by mouth daily. Refills: 0      CONTINUE these medications which have NOT CHANGED   Details  acetaminophen (TYLENOL) 500 MG tablet Take 500 mg by mouth every 6 (six) hours as needed for mild pain or moderate pain.    atropine 1 % ophthalmic ointment Place 1 application into the left eye daily at 12 noon.    b complex-vitamin c-folic acid (NEPHRO-VITE) 0.8 MG TABS tablet Take 1 tablet by mouth daily.    collagenase (SANTYL) ointment Apply 1 application topically every other day. Apply with dressing change to right foot    glimepiride (AMARYL) 2 MG tablet TAKE 1 TABLET EVERY DAY WITH BREAKFAST Qty: 30 tablet, Refills: 1    sevelamer carbonate (RENVELA) 800 MG tablet Take 1 tablet (800 mg total) by mouth 3 (three) times daily with meals. Qty: 90 tablet, Refills: 1    Amino Acids-Protein Hydrolys (FEEDING SUPPLEMENT, PRO-STAT SUGAR FREE 64,) LIQD Take 30 mLs by mouth 2 (two) times daily at 10 AM and 5 PM. Qty: 900 mL, Refills: 0    famotidine (PEPCID) 20 MG tablet Take 1 tablet (20 mg total) by mouth at bedtime. Qty: 30 tablet, Refills: 1    prednisoLONE acetate (PRED FORTE) 1 % ophthalmic suspension Place 1 drop into both eyes 3  (three) times daily. Qty: 5 mL, Refills: 0      STOP taking these medications     aspirin 81 MG chewable tablet      metoCLOPramide (REGLAN) 10 MG tablet      metoprolol tartrate (LOPRESSOR) 12.5 mg TABS tablet      midodrine (PROAMATINE) 5 MG tablet      OVER THE COUNTER MEDICATION      darbepoetin (ARANESP) 60 MCG/0.3ML SOLN injection      guaiFENesin (MUCINEX) 600 MG 12 hr tablet      oxyCODONE (OXY IR/ROXICODONE) 5 MG immediate release tablet      polyethylene glycol (MIRALAX / GLYCOLAX) packet        Allergies  Allergen Reactions  . Phenergan [Promethazine Hcl] Other (See Comments)    Causes patient to" vomit more"  . Vitamin D Analogs Rash    She was able to take  Hectorol 07/2014 admission and is now tolerating calcitriol 11/2014      The results of significant diagnostics from this hospitalization (including imaging, microbiology, ancillary and laboratory) are listed below for reference.    Significant Diagnostic Studies: Mr Foot Right Wo Contrast  12/08/2014   CLINICAL DATA:  Peripheral vascular disease. Ulceration along the lateral aspect of the heel.  EXAM: MRI OF THE RIGHT FOREFOOT WITHOUT CONTRAST  TECHNIQUE: Multiplanar, multisequence MR imaging was performed. No intravenous contrast was administered.  COMPARISON:  None.  FINDINGS: Peroneal: Peroneal longus tendon intact. Peroneal brevis intact.  Posteromedial: Posterior tibial tendon intact. Flexor hallucis longus tendon intact. Flexor digitorum longus tendon intact.  Anterior: Tibialis anterior tendon intact. Extensor hallucis longus tendon intact Extensor digitorum longus tendon intact.  Achilles: Intact.  Plantar Fascia: Intact.  LIGAMENTS  Medial: Deltoid ligament intact. Spring ligament intact.  Lateral: Anterior talofibular ligament intact. Calcaneofibular ligament intact. Posterior talofibular ligament intact. Anterior and posterior tibiofibular ligaments intact.  Ankle Joint: No joint effusion. No  dislocation.  No chondral defect.  Subtalar Joint/Sinus Tarsi: No joint effusion. Normal subtalar joints. Normal sinus tarsi.  Bone/Soft tissue:  There is a soft tissue ulcer along the lateral aspect of the hindfoot. There is no adjacent focal fluid collection to suggest an abscess. There is soft tissue edema throughout the right foot and ankle.  There is no focal marrow signal abnormality within the calcaneus. There is no bone destruction or periosteal reaction.  There is a transverse linear signal abnormality involving the fifth metatarsal neck without surrounding marrow edema likely reflecting a healed or chronic ununited fracture. There is no other marrow signal abnormality. 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  IMPRESSION: 1. Soft tissue ulcer along the lateral aspect of the hindfoot without a focal fluid collection to suggest an abscess. Soft tissue edema throughout the right foot and ankle which is nonspecific and may reflect reactive edema versus cellulitis. 2. No evidence of osteomyelitis of the right ankle.   Electronically Signed   By: Elige KoHetal  Patel   On: 12/08/2014 12:40    Microbiology: No results found for this or any previous visit (from the past 240 hour(s)).   Labs: Basic Metabolic Panel:  Recent Labs Lab 12/14/14 1559 12/15/14 0600 12/15/14 1528 12/16/14 0356  NA 136 135  --  136  K 4.6 4.1  --  4.5  CL 96 97  --  98  CO2 26 21  --  26  GLUCOSE 181* 136*  --  202*  BUN 89* 43*  --  57*  CREATININE 6.16* 4.05* 4.82* 5.41*  CALCIUM 9.6 9.3  --  9.4  PHOS 5.3*  --   --   --    Liver Function Tests:  Recent Labs Lab 12/14/14 1559  ALBUMIN 2.4*   No results for input(s): LIPASE, AMYLASE in the last 168 hours.  Recent Labs Lab 12/15/14 1524  AMMONIA 36*   CBC:  Recent Labs Lab 12/14/14 1559 12/15/14 0600 12/15/14 1528 12/16/14 0356  WBC 15.4* 16.1* 17.4* 16.8*  HGB 9.2* 8.6* 8.8* 9.0*  HCT 27.3* 26.3* 26.6* 27.4*  MCV 95.1 97.0 96.4  97.2  PLT 339 297 336 360   Cardiac Enzymes: No results for input(s): CKTOTAL, CKMB, CKMBINDEX, TROPONINI in the last 168 hours. BNP: BNP (last 3 results) No results for input(s): BNP in the last 8760 hours.  ProBNP (last 3 results) No results for input(s): PROBNP in the last 8760 hours.  CBG:  Recent Labs Lab  12/19/14 2121 12/20/14 1125 12/20/14 1658 12/20/14 2102 12/21/14 0603  GLUCAP 186* 218* 161* 163* 163*       Signed:  Penny Pia  [facility] 12/21/2014, 9:58 AM

## 2014-12-21 NOTE — Clinical Social Work Note (Signed)
[facility] Anticipated discharge date:12/21/14 Family notified: at bedside- -pt dtr Annabelle Harmanana [facility] by [facility] called at 11:45am  CSW signing off.  Merlyn LotJenna Holoman, LCSWA Clinical Social Worker [phone]

## 2014-12-30 NOTE — Op Note (Signed)
PATIENT NAME:  Briana Gould, Briana Gould MR#:  [id number] DATE OF BIRTH:  1945/12/19  DATE OF PROCEDURE:  06/04/2013  PREOPERATIVE DIAGNOSES:  1.  End-stage renal disease requiring hemodialysis.  2.  Complication dialysis device with hematoma and extravasation from fistula cannulation site.   POSTOPERATIVE DIAGNOSES:  1.  End-stage renal disease requiring hemodialysis.  2.  Complication dialysis device with hematoma and extravasation from fistula cannulation site.   PROCEDURE PERFORMED: Insertion of right IJ cuffed tunneled dialysis catheter with ultrasound and fluoroscopic guidance.   SURGEON: Levora DredgeGregory Schnier, MD  CONTRAST USED: None.   FLUOROSCOPY TIME: 0.9 minutes.   INDICATIONS: Ms. Cecil CobbsSwanston-Flow is a 69 year old woman who presents with inability to achieve dialysis. She has now missed 2 dialysis runs. Apparently by her and her daughter's description the more proximal cannulation site they have been pulling clots from. She also sustained a hematoma at this level. She has had this fistula for 3 years and has been using 2 buttonholes with great success. Her potassium is now rising and is becoming dangerous and she is recently, that is to say 2 days ago, undergone a fistulogram at another institution and was told the fistula is fine. I have discussed with the patient and her daughter extensively the possibilities that are yielding difficulty with cannulation and that angiography is likely not the optimal test as is given to the answer of normal fistulogram from just 2 days ago. Ideally the patient would have a duplex ultrasound to better evaluate whether the buttonhole site has a significant thrombus laminating a dilated area. However, the more pressing matter at this time is ensuring she has dialysis, especially since she has missed several days and has now approaching the weekend where she would miss not just 1 but 2 to 3 more days. Have therefore recommended she undergo placement of a tunneled  catheter so that she can undergo appropriate dialysis and maintain her therapy and subsequently we can workup her fistula problem in an attempt to salvage this or create a new access. The risks and benefits were reviewed in detail, all questions have been answered. The patient and daughter are both in agreement. We will place a catheter today and expedite workup of her fistula.   DESCRIPTION OF PROCEDURE: The patient is taken to special procedures and placed in the supine position. After adequate sedation is achieved, the right neck and chest wall are prepped and draped in sterile fashion. 1% lidocaine is infiltrated in the soft tissues and the ultrasound is placed in a sterile sleeve. Jugular vein is identified. It is echolucent and compressible indicating patency. Image is recorded for the permanent record. Under real-time visualization, microneedle is inserted, microwire followed by microsheath and J-wire is then negotiated under fluoroscopy into the inferior vena cava.   A small counterincision is made at the wire insertion site and a pocket created with blunt dissection, dilator is passed over the wire and subsequently dilator and peel-away sheath. A 19 cm tip to cuff Cannon catheter is then advanced through the peel-away sheath and the peel-away sheath is removed. The catheter is approximated to the chest wall and an exit site selected. A small incision is made, tunneling device is passed subcutaneously, the tunnel is dilated and then the catheter is pulled subcutaneously without difficulty. Under fluoroscopy it is adjusted so that the tips are at the atriocaval junction. It is transected and the hub assembly is connected. Both lumens aspirate easily, flush well  and under fluoroscopy there is a smooth contour,  free of kinks with tips in good position.   5000 units per lumen is then instilled. Caps are placed. The catheter is secured to the chest wall with 0 silk and then the counterincision is closed  with 4-0 Monocryl and then Dermabond. The patient tolerated the procedure well and there were no immediate complications.  ____________________________ Renford Dills, MD ggs:sb D: 06/04/2013 14:50:10 ET T: 06/04/2013 15:13:46 ET JOB#: [id number]  cc: Renford Dills, MD, <Dictator> Renford Dills MD ELECTRONICALLY SIGNED 06/11/2013 10:02

## 2014-12-30 NOTE — Op Note (Signed)
PATIENT NAME:  Briana Gould, Briana Gould MR#:  [id number] DATE OF BIRTH:  [birth year]  DATE OF PROCEDURE:  07/22/2013  PREOPERATIVE DIAGNOSES: 1.  End-stage renal disease.  2.  Poorly functioning left arm arteriovenous fistula.  3.  Steal symptoms, left upper extremity.  4.  Diabetes.  5.  Peripheral vascular disease.  POSTOPERATIVE DIAGNOSES: 1.  End-stage renal disease.  2.  Poorly functioning left arm arteriovenous fistula.  3.  Steal symptoms, left upper extremity.  4.  Diabetes.  5.  Peripheral vascular disease.   PROCEDURE:  1.  Ultrasound guidance for vascular access to left arm AV fistula in a retrograde fashion in the mid upper arm cephalic vein.  2.  Left upper extremity fistulogram.  3.  Left upper extremity angiogram with brachial and radial arteries.  4.  Percutaneous transluminal angioplasty of cephalic vein in the peri-anastomotic region with a 6 mm diameter angioplasty balloon.  5.  Percutaneous transluminal angioplasty of long segment stenosis and occlusion of the left radial artery with 3 mm diameter angioplasty balloon.  6.  Percutaneous transluminal angioplasty for stenosis in the ulnar artery with 3 mm diameter angioplasty balloon.   SURGEON:  Briana Gould, M.D.   ANESTHESIA:  Local with moderate conscious sedation.   ESTIMATED BLOOD LOSS:  25 mL.   INDICATION FOR PROCEDURE:  A 69 year old female with end-stage renal disease.  She has developed a hematoma and difficulties with access of her fistula as well as symptoms that are worrisome for Steal of the left upper extremity with pain in the hand worsening with dialysis.  She is brought in for angiography for further evaluation of both issues.  Risks and benefits were discussed.  Informed consent is obtained.   DESCRIPTION OF PROCEDURE:  The patient's left upper extremity is sterilely prepped and draped and a sterile surgical field is created.  The fistula was accessed in the mid upper arm cephalic vein under direct  ultrasound guidance without difficulty in a retrograde fashion.  Imaging through the sheath showed the majority of the fistula which was patent from the mid upper arm centrally.  Kumpe catheter at the arterial anastomosis revealed a 70% to 80% stenosis in the peri-anastomotic region of the cephalic vein and with the catheter parked in the brachial artery, the radial artery was found to have an occlusion and diffuse disease and the ulnar artery had multiple areas of stenosis.  I systemically heparinized the patient, initially used a Magic torque wire and treated the peri-anastomotic stenosis with a 6 mm diameter angioplasty balloon with good angiographic completion result and less than 40% residual stenosis.  I exchanged for an 0.018 wire.  I initially treated the ulnar artery.  I ballooned from just above the wrist back into the origin of the ulnar artery and multiple areas of waste were taken which resolved with angioplasty.  Completion of angiogram following this showed markedly improved flow within the ulnar artery.  I then gained access to the radial artery, passed the 0.018 wire into the hand and treated the radial artery from the distal forearm proximally.  There was still some areas of disease with stenosis and decreased flow in the radial artery compared to the ulnar, but this was improved as well.  At this point, I elected to terminate the procedure.  The sheath was removed, 4-0 Monocryl pursestring suture was placed, pressure was held.  Sterile dressing was placed.  The patient tolerated the procedure well and was taken to the recovery room in stable  condition.    ____________________________ Briana NeedyJason S. Dew, MD jsd:ea D: 07/22/2013 14:39:09 ET T: 07/22/2013 23:59:52 ET JOB#: [id number]  cc: Briana NeedyJason S. Dew, MD, <Dictator> Briana NeedyJASON S DEW MD ELECTRONICALLY SIGNED 07/26/2013 12:17

## 2015-01-08 DEATH — deceased

## 2016-11-04 IMAGING — CR DG KNEE COMPLETE 4+V*L*
5 series · 5 of 5 positions shown · non-contrast
Comparison: None.

CLINICAL DATA: Open wound.  Ulceration.

EXAM:
LEFT KNEE - COMPLETE 4+ VIEW

[t knee ap left]
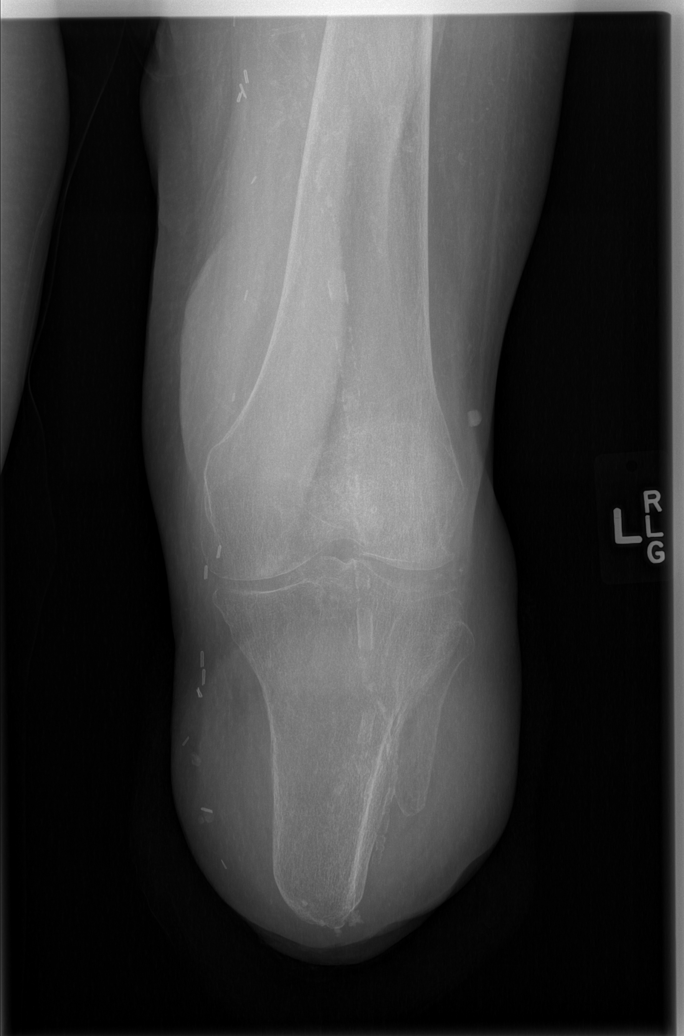

[t knee oblique left (1 of 2)]
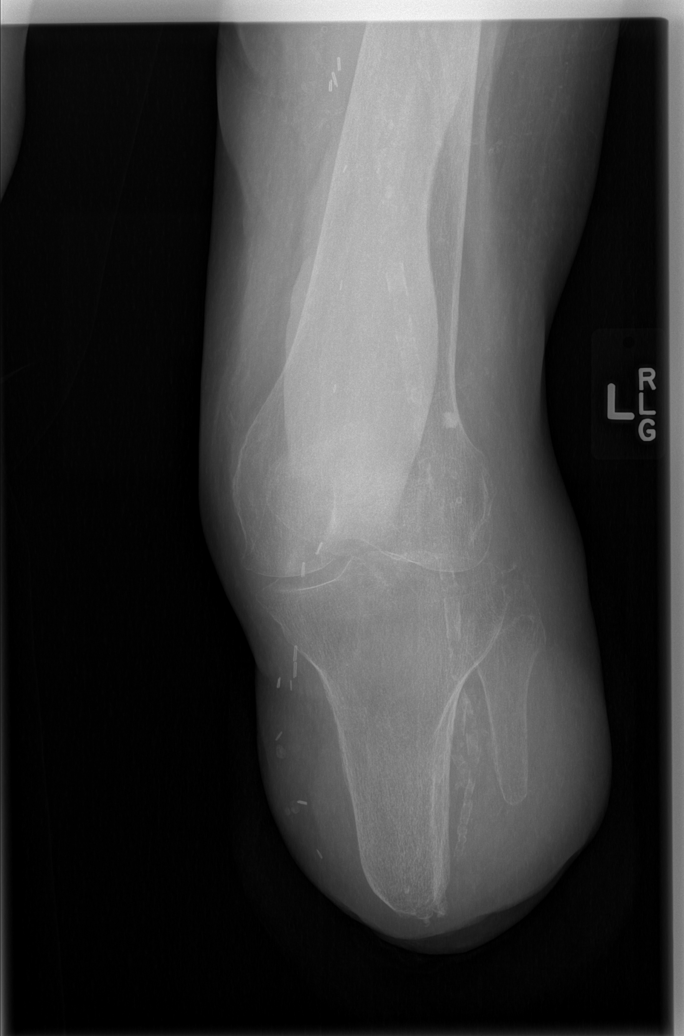

[t knee oblique left (2 of 2)]
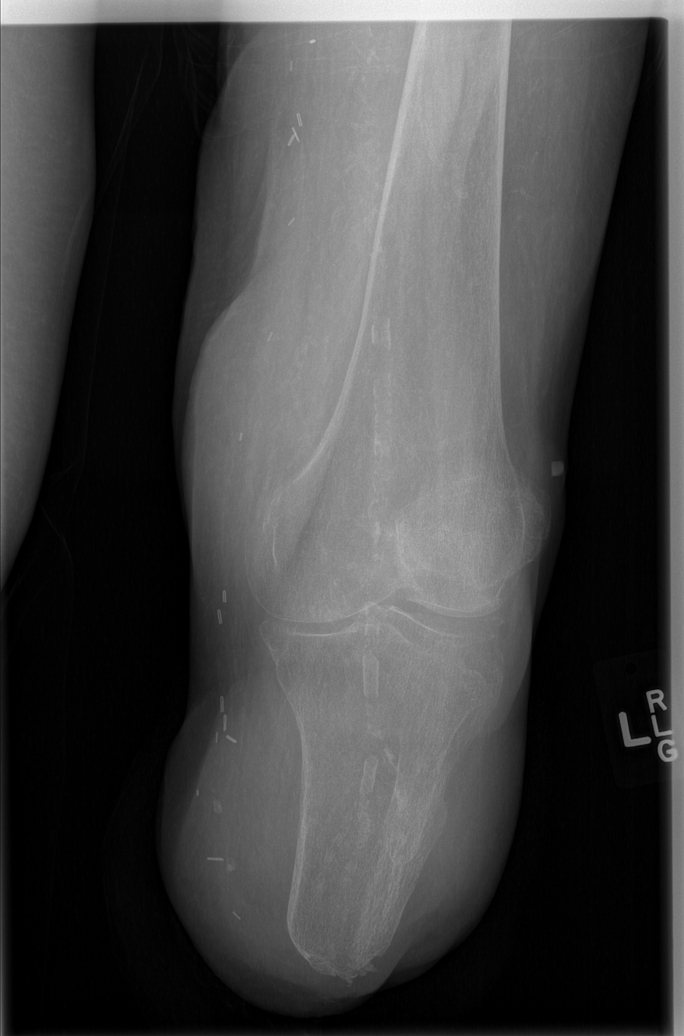

[t knee lat left (1 of 2)]
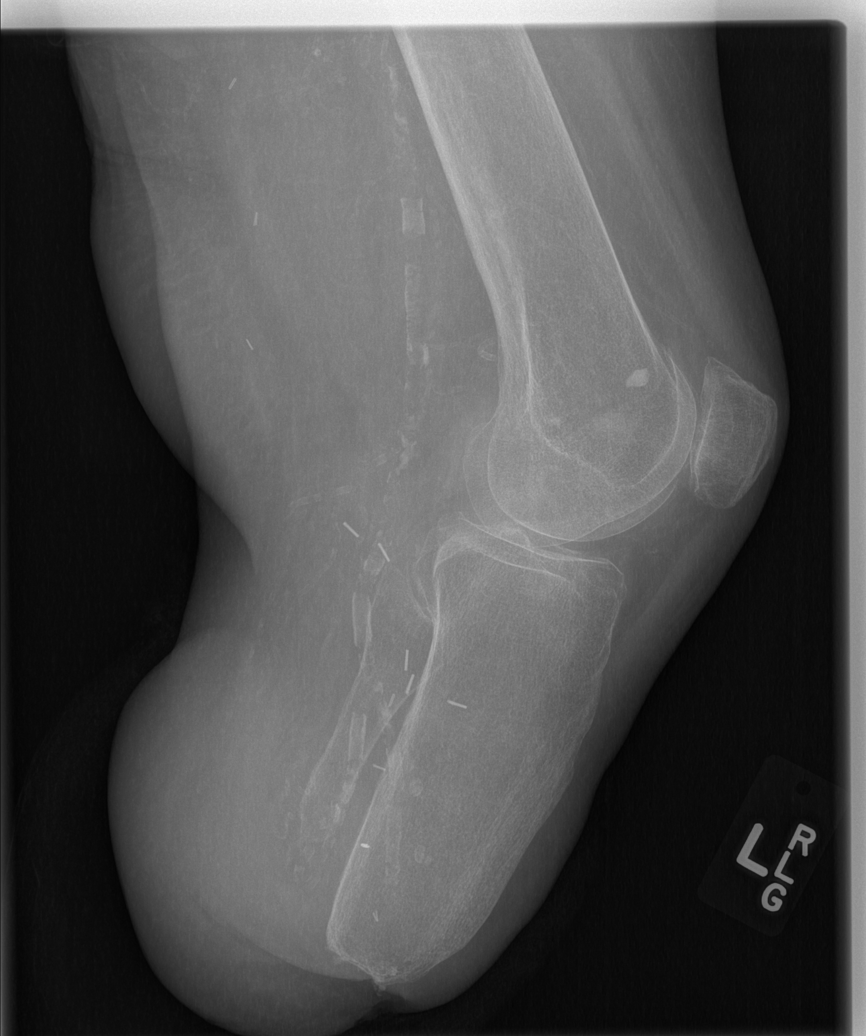

[t knee lat left (2 of 2)]
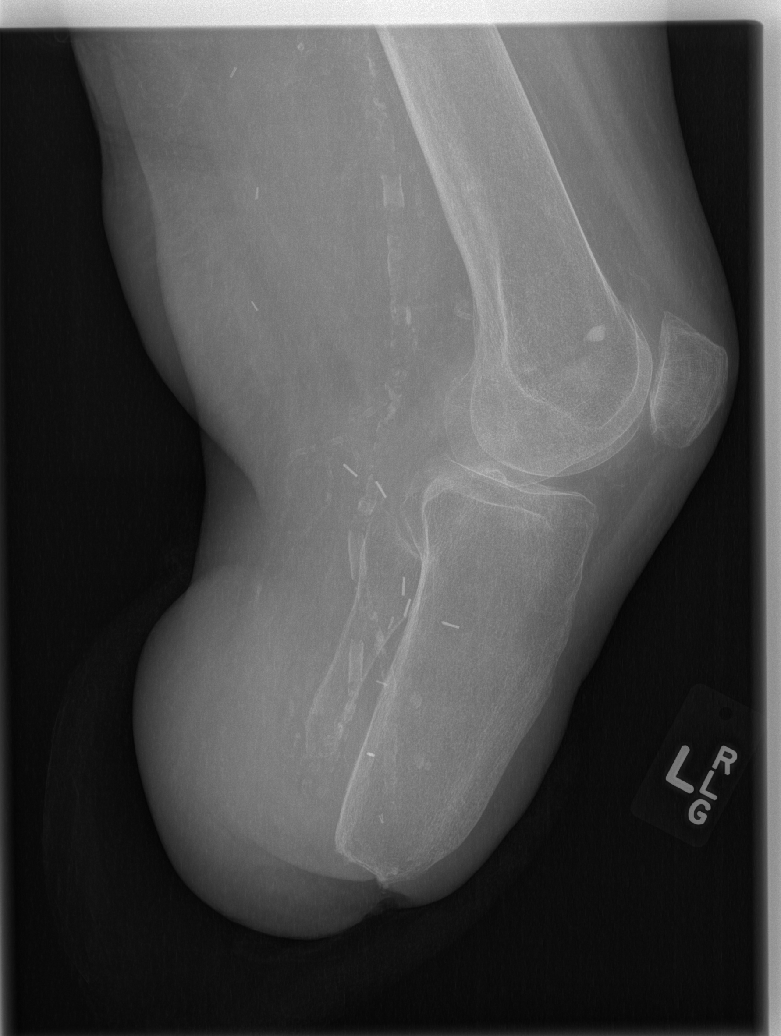

[5 of 5 positions shown; findings below may reference images not displayed]

FINDINGS: Surgical clips noted over the left lower extremity. Patient has had
prior per lobe knee amputation. Peripheral vascular calcifications
present. No evidence of effusion. Ulceration noted of the distal
stomach. No acute or underlying bony abnormality.
IMPRESSION: 1. Left BKA. Ulceration of the soft tissues of the distal stump. No
underlying bony abnormality identified.
2. Peripheral vascular disease.
